# Patient Record
Sex: Male | Born: 1935 | Race: Black or African American | Hispanic: No | Smoking: Former smoker
Health system: Southern US, Community
[De-identification: ages and names within clinical notes are randomized; demographics above are authoritative.]

## PROBLEM LIST (undated history)

## (undated) DIAGNOSIS — I5022 Chronic systolic (congestive) heart failure: Secondary | ICD-10-CM

## (undated) DIAGNOSIS — I10 Essential (primary) hypertension: Secondary | ICD-10-CM

## (undated) DIAGNOSIS — Z950 Presence of cardiac pacemaker: Secondary | ICD-10-CM

## (undated) DIAGNOSIS — Z95810 Presence of automatic (implantable) cardiac defibrillator: Secondary | ICD-10-CM

## (undated) DIAGNOSIS — N189 Chronic kidney disease, unspecified: Secondary | ICD-10-CM

## (undated) DIAGNOSIS — D126 Benign neoplasm of colon, unspecified: Secondary | ICD-10-CM

## (undated) DIAGNOSIS — G459 Transient cerebral ischemic attack, unspecified: Secondary | ICD-10-CM

## (undated) DIAGNOSIS — I219 Acute myocardial infarction, unspecified: Secondary | ICD-10-CM

## (undated) DIAGNOSIS — R972 Elevated prostate specific antigen [PSA]: Secondary | ICD-10-CM

## (undated) DIAGNOSIS — I6529 Occlusion and stenosis of unspecified carotid artery: Secondary | ICD-10-CM

## (undated) DIAGNOSIS — Z87442 Personal history of urinary calculi: Secondary | ICD-10-CM

## (undated) DIAGNOSIS — I739 Peripheral vascular disease, unspecified: Secondary | ICD-10-CM

## (undated) DIAGNOSIS — I313 Pericardial effusion (noninflammatory): Secondary | ICD-10-CM

## (undated) DIAGNOSIS — I454 Nonspecific intraventricular block: Secondary | ICD-10-CM

## (undated) DIAGNOSIS — E119 Type 2 diabetes mellitus without complications: Secondary | ICD-10-CM

## (undated) DIAGNOSIS — I3139 Other pericardial effusion (noninflammatory): Secondary | ICD-10-CM

## (undated) DIAGNOSIS — E785 Hyperlipidemia, unspecified: Secondary | ICD-10-CM

## (undated) DIAGNOSIS — J189 Pneumonia, unspecified organism: Secondary | ICD-10-CM

## (undated) HISTORY — DX: Essential (primary) hypertension: I10

## (undated) HISTORY — DX: Pericardial effusion (noninflammatory): I31.3

## (undated) HISTORY — PX: CATARACT EXTRACTION W/ INTRAOCULAR LENS  IMPLANT, BILATERAL: SHX1307

## (undated) HISTORY — DX: Other pericardial effusion (noninflammatory): I31.39

## (undated) HISTORY — DX: Peripheral vascular disease, unspecified: I73.9

## (undated) HISTORY — PX: INSERT / REPLACE / REMOVE PACEMAKER: SUR710

## (undated) HISTORY — PX: CARDIAC CATHETERIZATION: SHX172

## (undated) HISTORY — DX: Elevated prostate specific antigen (PSA): R97.20

## (undated) HISTORY — DX: Nonspecific intraventricular block: I45.4

## (undated) HISTORY — DX: Hyperlipidemia, unspecified: E78.5

## (undated) HISTORY — DX: Transient cerebral ischemic attack, unspecified: G45.9

## (undated) HISTORY — PX: CYSTOSCOPY: SUR368

## (undated) HISTORY — DX: Occlusion and stenosis of unspecified carotid artery: I65.29

## (undated) HISTORY — DX: Chronic systolic (congestive) heart failure: I50.22

## (undated) HISTORY — DX: Benign neoplasm of colon, unspecified: D12.6

## (undated) SURGERY — Surgical Case
Anesthesia: *Unknown

---

## 1988-09-25 DIAGNOSIS — I219 Acute myocardial infarction, unspecified: Secondary | ICD-10-CM

## 1988-09-25 HISTORY — PX: CORONARY ANGIOPLASTY WITH STENT PLACEMENT: SHX49

## 1988-09-25 HISTORY — DX: Acute myocardial infarction, unspecified: I21.9

## 1989-01-01 DIAGNOSIS — E1151 Type 2 diabetes mellitus with diabetic peripheral angiopathy without gangrene: Secondary | ICD-10-CM

## 1998-02-15 ENCOUNTER — Encounter: Admission: RE | Admit: 1998-02-15 | Discharge: 1998-02-15 | Payer: Self-pay | Admitting: Internal Medicine

## 1998-02-22 ENCOUNTER — Encounter: Admission: RE | Admit: 1998-02-22 | Discharge: 1998-02-22 | Payer: Self-pay | Admitting: Internal Medicine

## 1998-04-12 ENCOUNTER — Encounter: Admission: RE | Admit: 1998-04-12 | Discharge: 1998-04-12 | Payer: Self-pay | Admitting: Internal Medicine

## 1999-08-05 ENCOUNTER — Encounter: Admission: RE | Admit: 1999-08-05 | Discharge: 1999-08-05 | Payer: Self-pay | Admitting: Internal Medicine

## 1999-08-11 ENCOUNTER — Encounter: Admission: RE | Admit: 1999-08-11 | Discharge: 1999-08-11 | Payer: Self-pay | Admitting: Internal Medicine

## 1999-08-25 ENCOUNTER — Encounter: Admission: RE | Admit: 1999-08-25 | Discharge: 1999-08-25 | Payer: Self-pay | Admitting: Internal Medicine

## 1999-10-05 ENCOUNTER — Encounter: Admission: RE | Admit: 1999-10-05 | Discharge: 1999-10-05 | Payer: Self-pay | Admitting: Internal Medicine

## 1999-12-15 ENCOUNTER — Encounter: Admission: RE | Admit: 1999-12-15 | Discharge: 1999-12-15 | Payer: Self-pay | Admitting: Internal Medicine

## 2000-07-17 ENCOUNTER — Encounter: Admission: RE | Admit: 2000-07-17 | Discharge: 2000-07-17 | Payer: Self-pay

## 2000-07-24 ENCOUNTER — Encounter: Admission: RE | Admit: 2000-07-24 | Discharge: 2000-07-24 | Payer: Self-pay | Admitting: Hematology and Oncology

## 2000-10-01 ENCOUNTER — Emergency Department (HOSPITAL_COMMUNITY): Admission: EM | Admit: 2000-10-01 | Discharge: 2000-10-01 | Payer: Self-pay | Admitting: Emergency Medicine

## 2000-11-09 ENCOUNTER — Encounter: Payer: Self-pay | Admitting: Emergency Medicine

## 2000-11-09 ENCOUNTER — Emergency Department (HOSPITAL_COMMUNITY): Admission: EM | Admit: 2000-11-09 | Discharge: 2000-11-09 | Payer: Self-pay | Admitting: Emergency Medicine

## 2001-01-09 ENCOUNTER — Encounter: Admission: RE | Admit: 2001-01-09 | Discharge: 2001-01-09 | Payer: Self-pay | Admitting: Internal Medicine

## 2001-08-31 ENCOUNTER — Emergency Department (HOSPITAL_COMMUNITY): Admission: EM | Admit: 2001-08-31 | Discharge: 2001-08-31 | Payer: Self-pay | Admitting: Emergency Medicine

## 2001-10-30 ENCOUNTER — Encounter: Admission: RE | Admit: 2001-10-30 | Discharge: 2001-10-30 | Payer: Self-pay | Admitting: Internal Medicine

## 2001-11-26 ENCOUNTER — Emergency Department (HOSPITAL_COMMUNITY): Admission: EM | Admit: 2001-11-26 | Discharge: 2001-11-26 | Payer: Self-pay | Admitting: Emergency Medicine

## 2002-02-05 ENCOUNTER — Encounter: Admission: RE | Admit: 2002-02-05 | Discharge: 2002-02-05 | Payer: Self-pay | Admitting: Internal Medicine

## 2002-02-06 ENCOUNTER — Encounter: Admission: RE | Admit: 2002-02-06 | Discharge: 2002-02-06 | Payer: Self-pay | Admitting: Internal Medicine

## 2002-05-06 ENCOUNTER — Encounter: Payer: Self-pay | Admitting: Emergency Medicine

## 2002-05-06 ENCOUNTER — Emergency Department (HOSPITAL_COMMUNITY): Admission: EM | Admit: 2002-05-06 | Discharge: 2002-05-06 | Payer: Self-pay | Admitting: Emergency Medicine

## 2002-12-30 ENCOUNTER — Encounter: Admission: RE | Admit: 2002-12-30 | Discharge: 2002-12-30 | Payer: Self-pay | Admitting: Internal Medicine

## 2003-01-01 ENCOUNTER — Ambulatory Visit (HOSPITAL_COMMUNITY): Admission: RE | Admit: 2003-01-01 | Discharge: 2003-01-01 | Payer: Self-pay | Admitting: Internal Medicine

## 2003-01-01 ENCOUNTER — Encounter: Payer: Self-pay | Admitting: Internal Medicine

## 2003-04-07 ENCOUNTER — Emergency Department (HOSPITAL_COMMUNITY): Admission: EM | Admit: 2003-04-07 | Discharge: 2003-04-07 | Payer: Self-pay | Admitting: Emergency Medicine

## 2003-06-11 ENCOUNTER — Emergency Department (HOSPITAL_COMMUNITY): Admission: EM | Admit: 2003-06-11 | Discharge: 2003-06-11 | Payer: Self-pay | Admitting: Emergency Medicine

## 2003-06-18 ENCOUNTER — Encounter: Admission: RE | Admit: 2003-06-18 | Discharge: 2003-06-18 | Payer: Self-pay | Admitting: Internal Medicine

## 2003-07-06 ENCOUNTER — Encounter: Payer: Self-pay | Admitting: Emergency Medicine

## 2003-07-07 ENCOUNTER — Inpatient Hospital Stay (HOSPITAL_COMMUNITY): Admission: EM | Admit: 2003-07-07 | Discharge: 2003-07-15 | Payer: Self-pay | Admitting: Emergency Medicine

## 2003-07-07 ENCOUNTER — Encounter (INDEPENDENT_AMBULATORY_CARE_PROVIDER_SITE_OTHER): Payer: Self-pay | Admitting: Cardiology

## 2003-07-10 ENCOUNTER — Encounter: Payer: Self-pay | Admitting: Infectious Diseases

## 2003-07-13 ENCOUNTER — Encounter: Payer: Self-pay | Admitting: Infectious Diseases

## 2003-08-19 ENCOUNTER — Encounter: Admission: RE | Admit: 2003-08-19 | Discharge: 2003-08-19 | Payer: Self-pay | Admitting: Internal Medicine

## 2003-08-19 ENCOUNTER — Ambulatory Visit (HOSPITAL_COMMUNITY): Admission: RE | Admit: 2003-08-19 | Discharge: 2003-08-19 | Payer: Self-pay | Admitting: Internal Medicine

## 2003-09-04 ENCOUNTER — Encounter: Admission: RE | Admit: 2003-09-04 | Discharge: 2003-09-04 | Payer: Self-pay | Admitting: Internal Medicine

## 2003-09-05 ENCOUNTER — Emergency Department (HOSPITAL_COMMUNITY): Admission: EM | Admit: 2003-09-05 | Discharge: 2003-09-05 | Payer: Self-pay | Admitting: Emergency Medicine

## 2003-09-06 ENCOUNTER — Inpatient Hospital Stay (HOSPITAL_COMMUNITY): Admission: EM | Admit: 2003-09-06 | Discharge: 2003-09-12 | Payer: Self-pay | Admitting: Emergency Medicine

## 2003-09-11 DIAGNOSIS — I6529 Occlusion and stenosis of unspecified carotid artery: Secondary | ICD-10-CM

## 2003-09-11 HISTORY — PX: CAROTID STENT: SHX1301

## 2003-10-18 ENCOUNTER — Emergency Department (HOSPITAL_COMMUNITY): Admission: EM | Admit: 2003-10-18 | Discharge: 2003-10-18 | Payer: Self-pay | Admitting: *Deleted

## 2004-01-19 ENCOUNTER — Encounter: Admission: RE | Admit: 2004-01-19 | Discharge: 2004-01-19 | Payer: Self-pay | Admitting: Internal Medicine

## 2004-01-27 ENCOUNTER — Inpatient Hospital Stay (HOSPITAL_COMMUNITY): Admission: EM | Admit: 2004-01-27 | Discharge: 2004-01-29 | Payer: Self-pay | Admitting: Emergency Medicine

## 2004-02-25 ENCOUNTER — Encounter: Admission: RE | Admit: 2004-02-25 | Discharge: 2004-02-25 | Payer: Self-pay | Admitting: Internal Medicine

## 2004-03-24 ENCOUNTER — Encounter: Admission: RE | Admit: 2004-03-24 | Discharge: 2004-03-24 | Payer: Self-pay | Admitting: Internal Medicine

## 2004-04-05 ENCOUNTER — Encounter: Admission: RE | Admit: 2004-04-05 | Discharge: 2004-04-05 | Payer: Self-pay | Admitting: Internal Medicine

## 2004-05-11 ENCOUNTER — Encounter: Admission: RE | Admit: 2004-05-11 | Discharge: 2004-05-11 | Payer: Self-pay | Admitting: Internal Medicine

## 2004-05-24 ENCOUNTER — Inpatient Hospital Stay (HOSPITAL_COMMUNITY): Admission: RE | Admit: 2004-05-24 | Discharge: 2004-05-25 | Payer: Self-pay | Admitting: Internal Medicine

## 2004-05-24 HISTORY — PX: CARDIAC DEFIBRILLATOR PLACEMENT: SHX171

## 2004-06-28 ENCOUNTER — Ambulatory Visit: Payer: Self-pay | Admitting: Internal Medicine

## 2004-06-30 ENCOUNTER — Emergency Department (HOSPITAL_COMMUNITY): Admission: EM | Admit: 2004-06-30 | Discharge: 2004-06-30 | Payer: Self-pay | Admitting: Emergency Medicine

## 2004-07-08 ENCOUNTER — Ambulatory Visit (HOSPITAL_COMMUNITY): Admission: RE | Admit: 2004-07-08 | Discharge: 2004-07-08 | Payer: Self-pay | Admitting: Internal Medicine

## 2004-08-30 ENCOUNTER — Ambulatory Visit: Payer: Self-pay | Admitting: Internal Medicine

## 2004-09-05 ENCOUNTER — Ambulatory Visit (HOSPITAL_COMMUNITY): Admission: RE | Admit: 2004-09-05 | Discharge: 2004-09-05 | Payer: Self-pay | Admitting: Gastroenterology

## 2004-09-05 ENCOUNTER — Encounter (INDEPENDENT_AMBULATORY_CARE_PROVIDER_SITE_OTHER): Payer: Self-pay | Admitting: Specialist

## 2004-09-05 DIAGNOSIS — K2960 Other gastritis without bleeding: Secondary | ICD-10-CM | POA: Insufficient documentation

## 2004-10-13 ENCOUNTER — Ambulatory Visit: Payer: Self-pay | Admitting: Internal Medicine

## 2004-12-20 ENCOUNTER — Emergency Department (HOSPITAL_COMMUNITY): Admission: EM | Admit: 2004-12-20 | Discharge: 2004-12-20 | Payer: Self-pay | Admitting: Emergency Medicine

## 2005-03-07 ENCOUNTER — Ambulatory Visit: Payer: Self-pay | Admitting: Internal Medicine

## 2005-03-14 ENCOUNTER — Ambulatory Visit: Payer: Self-pay | Admitting: Internal Medicine

## 2005-04-12 ENCOUNTER — Inpatient Hospital Stay (HOSPITAL_COMMUNITY): Admission: EM | Admit: 2005-04-12 | Discharge: 2005-04-14 | Payer: Self-pay | Admitting: Emergency Medicine

## 2005-04-13 ENCOUNTER — Ambulatory Visit: Payer: Self-pay | Admitting: Cardiovascular Disease

## 2005-04-13 ENCOUNTER — Encounter (INDEPENDENT_AMBULATORY_CARE_PROVIDER_SITE_OTHER): Payer: Self-pay | Admitting: *Deleted

## 2005-04-13 ENCOUNTER — Ambulatory Visit: Payer: Self-pay | Admitting: Internal Medicine

## 2005-04-19 ENCOUNTER — Ambulatory Visit: Payer: Self-pay | Admitting: Cardiology

## 2005-04-27 ENCOUNTER — Ambulatory Visit: Payer: Self-pay | Admitting: Internal Medicine

## 2005-04-27 ENCOUNTER — Ambulatory Visit: Payer: Self-pay

## 2005-05-02 ENCOUNTER — Ambulatory Visit: Payer: Self-pay | Admitting: Internal Medicine

## 2005-05-16 ENCOUNTER — Ambulatory Visit: Payer: Self-pay | Admitting: Internal Medicine

## 2005-06-08 ENCOUNTER — Ambulatory Visit: Payer: Self-pay | Admitting: Internal Medicine

## 2005-07-27 ENCOUNTER — Ambulatory Visit: Payer: Self-pay | Admitting: Internal Medicine

## 2005-08-24 ENCOUNTER — Ambulatory Visit: Payer: Self-pay | Admitting: Internal Medicine

## 2005-09-12 ENCOUNTER — Ambulatory Visit: Payer: Self-pay | Admitting: Internal Medicine

## 2005-09-20 ENCOUNTER — Ambulatory Visit: Payer: Self-pay | Admitting: Internal Medicine

## 2005-10-21 ENCOUNTER — Emergency Department (HOSPITAL_COMMUNITY): Admission: EM | Admit: 2005-10-21 | Discharge: 2005-10-21 | Payer: Self-pay | Admitting: Emergency Medicine

## 2005-11-08 ENCOUNTER — Ambulatory Visit: Payer: Self-pay | Admitting: Internal Medicine

## 2005-12-12 ENCOUNTER — Ambulatory Visit: Payer: Self-pay | Admitting: Internal Medicine

## 2006-01-18 ENCOUNTER — Ambulatory Visit: Payer: Self-pay | Admitting: Internal Medicine

## 2006-04-10 ENCOUNTER — Ambulatory Visit: Payer: Self-pay | Admitting: Internal Medicine

## 2006-06-19 ENCOUNTER — Ambulatory Visit: Payer: Self-pay | Admitting: Internal Medicine

## 2006-06-28 ENCOUNTER — Ambulatory Visit: Payer: Self-pay | Admitting: Internal Medicine

## 2006-07-19 ENCOUNTER — Ambulatory Visit: Payer: Self-pay | Admitting: Internal Medicine

## 2006-07-19 LAB — CONVERTED CEMR LAB
BUN: 16 mg/dL (ref 6–23)
CO2: 24 meq/L (ref 19–32)
Calcium: 9.8 mg/dL (ref 8.4–10.5)
Chloride: 106 meq/L (ref 96–112)
Creatinine, Ser: 1.31 mg/dL (ref 0.40–1.50)
Glucose, Bld: 195 mg/dL — ABNORMAL HIGH (ref 70–99)
Potassium: 4.1 meq/L (ref 3.5–5.3)
Sodium: 139 meq/L (ref 135–145)

## 2006-10-02 ENCOUNTER — Ambulatory Visit: Payer: Self-pay | Admitting: Internal Medicine

## 2006-10-12 ENCOUNTER — Ambulatory Visit: Payer: Self-pay

## 2006-12-07 ENCOUNTER — Telehealth (INDEPENDENT_AMBULATORY_CARE_PROVIDER_SITE_OTHER): Payer: Self-pay | Admitting: *Deleted

## 2006-12-18 ENCOUNTER — Telehealth: Payer: Self-pay | Admitting: *Deleted

## 2007-01-02 ENCOUNTER — Telehealth: Payer: Self-pay | Admitting: *Deleted

## 2007-01-02 ENCOUNTER — Emergency Department (HOSPITAL_COMMUNITY): Admission: EM | Admit: 2007-01-02 | Discharge: 2007-01-02 | Payer: Self-pay | Admitting: Emergency Medicine

## 2007-01-02 DIAGNOSIS — E785 Hyperlipidemia, unspecified: Secondary | ICD-10-CM

## 2007-01-02 DIAGNOSIS — D509 Iron deficiency anemia, unspecified: Secondary | ICD-10-CM

## 2007-01-02 DIAGNOSIS — I739 Peripheral vascular disease, unspecified: Secondary | ICD-10-CM

## 2007-01-03 ENCOUNTER — Ambulatory Visit: Payer: Self-pay | Admitting: Internal Medicine

## 2007-01-03 LAB — CONVERTED CEMR LAB: Blood Glucose, Fingerstick: 195

## 2007-01-09 ENCOUNTER — Encounter: Payer: Self-pay | Admitting: Internal Medicine

## 2007-02-28 ENCOUNTER — Encounter: Payer: Self-pay | Admitting: Internal Medicine

## 2007-04-18 ENCOUNTER — Ambulatory Visit: Payer: Self-pay | Admitting: Internal Medicine

## 2007-04-24 ENCOUNTER — Telehealth (INDEPENDENT_AMBULATORY_CARE_PROVIDER_SITE_OTHER): Payer: Self-pay | Admitting: *Deleted

## 2007-04-30 ENCOUNTER — Ambulatory Visit: Payer: Self-pay | Admitting: Internal Medicine

## 2007-07-01 ENCOUNTER — Telehealth: Payer: Self-pay | Admitting: *Deleted

## 2007-07-10 ENCOUNTER — Telehealth: Payer: Self-pay | Admitting: *Deleted

## 2007-07-30 ENCOUNTER — Ambulatory Visit: Payer: Self-pay | Admitting: Internal Medicine

## 2007-08-18 ENCOUNTER — Emergency Department (HOSPITAL_COMMUNITY): Admission: EM | Admit: 2007-08-18 | Discharge: 2007-08-18 | Payer: Self-pay | Admitting: Emergency Medicine

## 2007-09-23 ENCOUNTER — Telehealth: Payer: Self-pay | Admitting: *Deleted

## 2007-10-10 ENCOUNTER — Encounter: Payer: Self-pay | Admitting: Internal Medicine

## 2007-10-21 ENCOUNTER — Emergency Department (HOSPITAL_COMMUNITY): Admission: EM | Admit: 2007-10-21 | Discharge: 2007-10-21 | Payer: Self-pay | Admitting: Emergency Medicine

## 2007-10-23 ENCOUNTER — Encounter: Payer: Self-pay | Admitting: Internal Medicine

## 2007-10-29 ENCOUNTER — Ambulatory Visit: Payer: Self-pay | Admitting: Internal Medicine

## 2007-12-25 ENCOUNTER — Ambulatory Visit: Payer: Self-pay | Admitting: Internal Medicine

## 2007-12-25 DIAGNOSIS — I5022 Chronic systolic (congestive) heart failure: Secondary | ICD-10-CM

## 2007-12-25 DIAGNOSIS — I2510 Atherosclerotic heart disease of native coronary artery without angina pectoris: Secondary | ICD-10-CM

## 2007-12-25 LAB — CONVERTED CEMR LAB
ALT: 13 units/L (ref 0–53)
AST: 22 units/L (ref 0–37)
Albumin: 4.4 g/dL (ref 3.5–5.2)
Alkaline Phosphatase: 65 units/L (ref 39–117)
BUN: 28 mg/dL — ABNORMAL HIGH (ref 6–23)
Basophils Absolute: 0 10*3/uL (ref 0.0–0.1)
Basophils Relative: 1 % (ref 0–1)
Blood Glucose, Fingerstick: 165
CO2: 22 meq/L (ref 19–32)
Calcium: 9.6 mg/dL (ref 8.4–10.5)
Chloride: 107 meq/L (ref 96–112)
Cholesterol: 159 mg/dL (ref 0–200)
Creatinine, Ser: 1.26 mg/dL (ref 0.40–1.50)
Creatinine, Urine: 284.4 mg/dL
Eosinophils Absolute: 0.1 10*3/uL (ref 0.0–0.7)
Eosinophils Relative: 2 % (ref 0–5)
Ferritin: 47 ng/mL (ref 22–322)
Glucose, Bld: 187 mg/dL — ABNORMAL HIGH (ref 70–99)
HCT: 44.7 % (ref 39.0–52.0)
HDL: 61 mg/dL (ref >39)
Hemoglobin: 14.6 g/dL (ref 13.0–17.0)
Hgb A1c MFr Bld: 8 %
LDL Cholesterol: 80 mg/dL (ref 0–99)
Lymphocytes Relative: 36 % (ref 12–46)
Lymphs Abs: 2.3 10*3/uL (ref 0.7–4.0)
MCHC: 32.7 g/dL (ref 30.0–36.0)
MCV: 95.5 fL (ref 78.0–100.0)
Microalb Creat Ratio: 9.3 mg/g (ref 0.0–30.0)
Microalb, Ur: 2.65 mg/dL — ABNORMAL HIGH (ref 0.00–1.89)
Monocytes Absolute: 0.7 10*3/uL (ref 0.1–1.0)
Monocytes Relative: 12 % (ref 3–12)
Neutro Abs: 3.1 10*3/uL (ref 1.7–7.7)
Neutrophils Relative %: 50 % (ref 43–77)
Platelets: 196 10*3/uL (ref 150–400)
Potassium: 4.5 meq/L (ref 3.5–5.3)
RBC: 4.68 M/uL (ref 4.22–5.81)
RDW: 14.5 % (ref 11.5–15.5)
Sodium: 141 meq/L (ref 135–145)
Total Bilirubin: 0.6 mg/dL (ref 0.3–1.2)
Total CHOL/HDL Ratio: 2.6
Total Protein: 7.4 g/dL (ref 6.0–8.3)
Triglycerides: 91 mg/dL (ref <150)
VLDL: 18 mg/dL (ref 0–40)
WBC: 6.3 10*3/uL (ref 4.0–10.5)

## 2007-12-31 ENCOUNTER — Encounter: Payer: Self-pay | Admitting: Internal Medicine

## 2008-01-02 ENCOUNTER — Ambulatory Visit: Payer: Self-pay | Admitting: Infectious Disease

## 2008-01-02 LAB — CONVERTED CEMR LAB: Blood Glucose, Home Monitor: 2 mg/dL

## 2008-01-03 ENCOUNTER — Encounter: Payer: Self-pay | Admitting: Internal Medicine

## 2008-01-13 ENCOUNTER — Encounter: Payer: Self-pay | Admitting: Internal Medicine

## 2008-02-06 ENCOUNTER — Ambulatory Visit: Payer: Self-pay | Admitting: Internal Medicine

## 2008-02-07 ENCOUNTER — Telehealth: Payer: Self-pay | Admitting: Internal Medicine

## 2008-02-18 ENCOUNTER — Encounter: Payer: Self-pay | Admitting: Internal Medicine

## 2008-05-06 ENCOUNTER — Ambulatory Visit: Payer: Self-pay | Admitting: Cardiology

## 2008-05-06 ENCOUNTER — Observation Stay (HOSPITAL_COMMUNITY): Admission: AD | Admit: 2008-05-06 | Discharge: 2008-05-08 | Payer: Self-pay | Admitting: Internal Medicine

## 2008-05-06 ENCOUNTER — Ambulatory Visit: Payer: Self-pay

## 2008-05-15 ENCOUNTER — Ambulatory Visit: Payer: Self-pay | Admitting: Internal Medicine

## 2008-07-09 ENCOUNTER — Encounter: Payer: Self-pay | Admitting: Internal Medicine

## 2008-08-17 ENCOUNTER — Telehealth: Payer: Self-pay | Admitting: Internal Medicine

## 2008-09-21 ENCOUNTER — Emergency Department (HOSPITAL_COMMUNITY): Admission: EM | Admit: 2008-09-21 | Discharge: 2008-09-21 | Payer: Self-pay | Admitting: Emergency Medicine

## 2008-09-21 DIAGNOSIS — E875 Hyperkalemia: Secondary | ICD-10-CM

## 2008-09-30 ENCOUNTER — Ambulatory Visit: Payer: Self-pay | Admitting: Internal Medicine

## 2008-09-30 ENCOUNTER — Encounter: Payer: Self-pay | Admitting: Internal Medicine

## 2008-09-30 LAB — CONVERTED CEMR LAB
ALT: 38 units/L (ref 0–53)
AST: 39 units/L — ABNORMAL HIGH (ref 0–37)
Albumin: 3.8 g/dL (ref 3.5–5.2)
Alkaline Phosphatase: 66 units/L (ref 39–117)
BUN: 22 mg/dL (ref 6–23)
Basophils Absolute: 0 10*3/uL (ref 0.0–0.1)
Basophils Relative: 0 % (ref 0–1)
Blood Glucose, AC Bkfst: 297 mg/dL
CO2: 27 meq/L (ref 19–32)
Calcium: 9.8 mg/dL (ref 8.4–10.5)
Chloride: 103 meq/L (ref 96–112)
Cholesterol: 160 mg/dL (ref 0–200)
Creatinine, Ser: 1.72 mg/dL — ABNORMAL HIGH (ref 0.40–1.50)
Eosinophils Absolute: 0.1 10*3/uL (ref 0.0–0.7)
Eosinophils Relative: 2 % (ref 0–5)
Glucose, Bld: 276 mg/dL — ABNORMAL HIGH (ref 70–99)
HCT: 43.5 % (ref 39.0–52.0)
HDL: 53 mg/dL (ref >39)
Hemoglobin: 14.4 g/dL (ref 13.0–17.0)
Hgb A1c MFr Bld: 11.6 %
LDL Cholesterol: 90 mg/dL (ref 0–99)
Lymphocytes Relative: 38 % (ref 12–46)
Lymphs Abs: 2.4 10*3/uL (ref 0.7–4.0)
MCHC: 33.1 g/dL (ref 30.0–36.0)
MCV: 94.6 fL (ref 78.0–100.0)
Monocytes Absolute: 0.7 10*3/uL (ref 0.1–1.0)
Monocytes Relative: 12 % (ref 3–12)
Neutro Abs: 3 10*3/uL (ref 1.7–7.7)
Neutrophils Relative %: 48 % (ref 43–77)
Platelets: 176 10*3/uL (ref 150–400)
Potassium: 5.3 meq/L (ref 3.5–5.3)
RBC: 4.6 M/uL (ref 4.22–5.81)
RDW: 13.4 % (ref 11.5–15.5)
Sodium: 136 meq/L (ref 135–145)
Total Bilirubin: 0.6 mg/dL (ref 0.3–1.2)
Total CHOL/HDL Ratio: 3
Total Protein: 7.4 g/dL (ref 6.0–8.3)
Triglycerides: 85 mg/dL (ref <150)
VLDL: 17 mg/dL (ref 0–40)
WBC: 6.3 10*3/uL (ref 4.0–10.5)

## 2008-10-13 ENCOUNTER — Telehealth (INDEPENDENT_AMBULATORY_CARE_PROVIDER_SITE_OTHER): Payer: Self-pay | Admitting: *Deleted

## 2008-11-18 ENCOUNTER — Encounter: Payer: Self-pay | Admitting: Licensed Clinical Social Worker

## 2008-11-18 ENCOUNTER — Telehealth: Payer: Self-pay | Admitting: Internal Medicine

## 2008-11-18 ENCOUNTER — Encounter: Payer: Self-pay | Admitting: Internal Medicine

## 2008-11-20 ENCOUNTER — Telehealth: Payer: Self-pay | Admitting: Licensed Clinical Social Worker

## 2008-11-30 ENCOUNTER — Telehealth: Payer: Self-pay | Admitting: Internal Medicine

## 2008-12-18 ENCOUNTER — Encounter: Payer: Self-pay | Admitting: Internal Medicine

## 2008-12-28 ENCOUNTER — Telehealth: Payer: Self-pay | Admitting: Licensed Clinical Social Worker

## 2009-02-18 ENCOUNTER — Emergency Department (HOSPITAL_COMMUNITY): Admission: EM | Admit: 2009-02-18 | Discharge: 2009-02-18 | Payer: Self-pay | Admitting: Emergency Medicine

## 2009-03-10 ENCOUNTER — Ambulatory Visit: Payer: Self-pay | Admitting: Internal Medicine

## 2009-03-10 DIAGNOSIS — R972 Elevated prostate specific antigen [PSA]: Secondary | ICD-10-CM

## 2009-03-10 DIAGNOSIS — K5900 Constipation, unspecified: Secondary | ICD-10-CM

## 2009-03-10 LAB — CONVERTED CEMR LAB
Blood Glucose, Fingerstick: 240
Hgb A1c MFr Bld: 8.2 %

## 2009-03-11 LAB — CONVERTED CEMR LAB
ALT: 14 units/L (ref 0–53)
AST: 21 units/L (ref 0–37)
Albumin: 4.2 g/dL (ref 3.5–5.2)
Alkaline Phosphatase: 56 units/L (ref 39–117)
BUN: 37 mg/dL — ABNORMAL HIGH (ref 6–23)
CO2: 20 meq/L (ref 19–32)
Calcium: 9.7 mg/dL (ref 8.4–10.5)
Chloride: 109 meq/L (ref 96–112)
Creatinine, Ser: 1.76 mg/dL — ABNORMAL HIGH (ref 0.40–1.50)
Creatinine, Urine: 268.3 mg/dL
GFR calc Af Amer: 46 mL/min — ABNORMAL LOW (ref >60)
GFR calc non Af Amer: 38 mL/min — ABNORMAL LOW (ref >60)
Glucose, Bld: 222 mg/dL — ABNORMAL HIGH (ref 70–99)
Microalb Creat Ratio: 38.5 mg/g — ABNORMAL HIGH (ref 0.0–30.0)
Microalb, Ur: 10.32 mg/dL — ABNORMAL HIGH (ref 0.00–1.89)
PSA: 5.53 ng/mL — ABNORMAL HIGH (ref 0.10–4.00)
Potassium: 4.6 meq/L (ref 3.5–5.3)
Sodium: 138 meq/L (ref 135–145)
Total Bilirubin: 0.4 mg/dL (ref 0.3–1.2)
Total Protein: 7.6 g/dL (ref 6.0–8.3)

## 2009-05-07 ENCOUNTER — Encounter: Payer: Self-pay | Admitting: Internal Medicine

## 2009-05-12 ENCOUNTER — Ambulatory Visit: Payer: Self-pay | Admitting: Internal Medicine

## 2009-05-12 DIAGNOSIS — N259 Disorder resulting from impaired renal tubular function, unspecified: Secondary | ICD-10-CM | POA: Insufficient documentation

## 2009-05-12 DIAGNOSIS — M545 Low back pain: Secondary | ICD-10-CM

## 2009-05-12 LAB — CONVERTED CEMR LAB
BUN: 27 mg/dL — ABNORMAL HIGH (ref 6–23)
Bacteria, UA: NONE SEEN
Basophils Absolute: 0 10*3/uL (ref 0.0–0.1)
Basophils Relative: 0 % (ref 0–1)
Bilirubin Urine: NEGATIVE
Blood Glucose, Fingerstick: 217
CO2: 23 meq/L (ref 19–32)
Calcium: 9.8 mg/dL (ref 8.4–10.5)
Chloride: 105 meq/L (ref 96–112)
Creatinine, Ser: 1.54 mg/dL — ABNORMAL HIGH (ref 0.40–1.50)
Eosinophils Absolute: 0.2 10*3/uL (ref 0.0–0.7)
Eosinophils Relative: 3 % (ref 0–5)
Glucose, Bld: 194 mg/dL — ABNORMAL HIGH (ref 70–99)
HCT: 44.7 % (ref 39.0–52.0)
Hemoglobin, Urine: NEGATIVE
Hemoglobin: 14.2 g/dL (ref 13.0–17.0)
Hgb A1c MFr Bld: 8.5 %
Ketones, ur: NEGATIVE mg/dL
Lymphocytes Relative: 37 % (ref 12–46)
Lymphs Abs: 2.7 10*3/uL (ref 0.7–4.0)
MCHC: 31.8 g/dL (ref 30.0–36.0)
MCV: 96.8 fL (ref >=78.0)
Monocytes Absolute: 1.2 10*3/uL — ABNORMAL HIGH (ref 0.1–1.0)
Monocytes Relative: 17 % — ABNORMAL HIGH (ref 3–12)
Neutro Abs: 3.1 10*3/uL (ref 1.7–7.7)
Neutrophils Relative %: 43 % (ref 43–77)
Nitrite: NEGATIVE
Platelets: 189 10*3/uL (ref 150–400)
Potassium: 5.1 meq/L (ref 3.5–5.3)
Protein, ur: NEGATIVE mg/dL
RBC / HPF: NONE SEEN (ref <3)
RBC: 4.62 M/uL (ref 4.22–5.81)
RDW: 14.3 % (ref 11.5–15.5)
Sed Rate: 5 mm/hr (ref 0–16)
Sodium: 139 meq/L (ref 135–145)
Specific Gravity, Urine: 1.023 (ref 1.005–1.0)
Urine Glucose: 500 mg/dL — AB
Urobilinogen, UA: 0.2 (ref 0.0–1.0)
WBC: 7.2 10*3/uL (ref 4.0–10.5)
pH: 6 (ref 5.0–8.0)

## 2009-06-07 ENCOUNTER — Encounter: Payer: Self-pay | Admitting: Internal Medicine

## 2009-06-16 ENCOUNTER — Encounter: Payer: Self-pay | Admitting: Internal Medicine

## 2009-07-12 ENCOUNTER — Encounter: Payer: Self-pay | Admitting: Internal Medicine

## 2009-07-21 ENCOUNTER — Encounter: Payer: Self-pay | Admitting: Internal Medicine

## 2009-09-13 ENCOUNTER — Telehealth: Payer: Self-pay | Admitting: Internal Medicine

## 2009-11-03 ENCOUNTER — Encounter: Payer: Self-pay | Admitting: Internal Medicine

## 2009-11-10 ENCOUNTER — Ambulatory Visit: Payer: Self-pay | Admitting: Internal Medicine

## 2009-11-10 LAB — CONVERTED CEMR LAB
ALT: 14 units/L (ref 0–53)
AST: 27 units/L (ref 0–37)
Albumin: 4.1 g/dL (ref 3.5–5.2)
Alkaline Phosphatase: 56 units/L (ref 39–117)
BUN: 24 mg/dL — ABNORMAL HIGH (ref 6–23)
Basophils Absolute: 0 10*3/uL (ref 0.0–0.1)
Basophils Relative: 0 % (ref 0–1)
Blood Glucose, Fingerstick: 156
CO2: 24 meq/L (ref 19–32)
Calcium: 10.2 mg/dL (ref 8.4–10.5)
Chloride: 104 meq/L (ref 96–112)
Cholesterol: 149 mg/dL (ref 0–200)
Creatinine, Ser: 1.66 mg/dL — ABNORMAL HIGH (ref 0.40–1.50)
Eosinophils Absolute: 0.2 10*3/uL (ref 0.0–0.7)
Eosinophils Relative: 3 % (ref 0–5)
Glucose, Bld: 130 mg/dL — ABNORMAL HIGH (ref 70–99)
HCT: 44.7 % (ref 39.0–52.0)
HDL: 55 mg/dL (ref >39)
Hemoglobin: 14.3 g/dL (ref 13.0–17.0)
Hgb A1c MFr Bld: 9.3 %
LDL Cholesterol: 82 mg/dL (ref 0–99)
Lymphocytes Relative: 42 % (ref 12–46)
Lymphs Abs: 3 10*3/uL (ref 0.7–4.0)
MCHC: 32 g/dL (ref 30.0–36.0)
MCV: 96.8 fL (ref >=78.0)
Monocytes Absolute: 0.8 10*3/uL (ref 0.1–1.0)
Monocytes Relative: 11 % (ref 3–12)
Neutro Abs: 3.2 10*3/uL (ref 1.7–7.7)
Neutrophils Relative %: 44 % (ref 43–77)
Platelets: 165 10*3/uL (ref 150–400)
Potassium: 5 meq/L (ref 3.5–5.3)
RBC: 4.62 M/uL (ref 4.22–5.81)
RDW: 13.7 % (ref 11.5–15.5)
Sed Rate: 13 mm/hr (ref 0–16)
Sodium: 138 meq/L (ref 135–145)
Total Bilirubin: 0.5 mg/dL (ref 0.3–1.2)
Total CHOL/HDL Ratio: 2.7
Total Protein: 7.1 g/dL (ref 6.0–8.3)
Triglycerides: 62 mg/dL (ref <150)
VLDL: 12 mg/dL (ref 0–40)
WBC: 7.2 10*3/uL (ref 4.0–10.5)

## 2009-12-01 ENCOUNTER — Encounter: Payer: Self-pay | Admitting: Internal Medicine

## 2009-12-01 ENCOUNTER — Ambulatory Visit (HOSPITAL_COMMUNITY): Admission: RE | Admit: 2009-12-01 | Discharge: 2009-12-01 | Payer: Self-pay | Admitting: Internal Medicine

## 2009-12-07 ENCOUNTER — Ambulatory Visit: Payer: Self-pay | Admitting: Internal Medicine

## 2009-12-09 ENCOUNTER — Telehealth: Payer: Self-pay | Admitting: Internal Medicine

## 2009-12-21 ENCOUNTER — Encounter: Payer: Self-pay | Admitting: Internal Medicine

## 2009-12-29 ENCOUNTER — Ambulatory Visit: Payer: Self-pay | Admitting: Internal Medicine

## 2009-12-29 LAB — CONVERTED CEMR LAB
Blood Glucose, AC Bkfst: 278 mg/dL
Hgb A1c MFr Bld: 10.7 %

## 2010-01-15 ENCOUNTER — Emergency Department (HOSPITAL_COMMUNITY): Admission: EM | Admit: 2010-01-15 | Discharge: 2010-01-15 | Payer: Self-pay | Admitting: Emergency Medicine

## 2010-01-17 ENCOUNTER — Ambulatory Visit: Payer: Self-pay | Admitting: Internal Medicine

## 2010-01-17 LAB — CONVERTED CEMR LAB

## 2010-01-18 ENCOUNTER — Telehealth: Payer: Self-pay | Admitting: Internal Medicine

## 2010-01-18 ENCOUNTER — Telehealth (INDEPENDENT_AMBULATORY_CARE_PROVIDER_SITE_OTHER): Payer: Self-pay | Admitting: *Deleted

## 2010-01-24 ENCOUNTER — Telehealth: Payer: Self-pay | Admitting: Internal Medicine

## 2010-05-21 ENCOUNTER — Emergency Department (HOSPITAL_COMMUNITY): Admission: EM | Admit: 2010-05-21 | Discharge: 2010-05-21 | Payer: Self-pay | Admitting: Emergency Medicine

## 2010-06-01 ENCOUNTER — Encounter: Payer: Self-pay | Admitting: Internal Medicine

## 2010-06-13 ENCOUNTER — Ambulatory Visit: Payer: Self-pay

## 2010-07-04 ENCOUNTER — Telehealth: Payer: Self-pay | Admitting: Internal Medicine

## 2010-07-08 ENCOUNTER — Encounter: Payer: Self-pay | Admitting: Internal Medicine

## 2010-07-13 ENCOUNTER — Ambulatory Visit: Payer: Self-pay | Admitting: Internal Medicine

## 2010-07-13 LAB — CONVERTED CEMR LAB
Blood Glucose, Fingerstick: 177
Hgb A1c MFr Bld: 6.4 %

## 2010-08-17 ENCOUNTER — Emergency Department (HOSPITAL_COMMUNITY)
Admission: EM | Admit: 2010-08-17 | Discharge: 2010-08-17 | Payer: Self-pay | Source: Home / Self Care | Admitting: Emergency Medicine

## 2010-08-30 ENCOUNTER — Encounter: Payer: Self-pay | Admitting: Internal Medicine

## 2010-09-08 ENCOUNTER — Ambulatory Visit: Payer: Self-pay

## 2010-09-09 ENCOUNTER — Encounter (INDEPENDENT_AMBULATORY_CARE_PROVIDER_SITE_OTHER): Payer: Self-pay | Admitting: *Deleted

## 2010-09-27 ENCOUNTER — Emergency Department (HOSPITAL_COMMUNITY)
Admission: EM | Admit: 2010-09-27 | Discharge: 2010-09-27 | Payer: Self-pay | Source: Home / Self Care | Admitting: Emergency Medicine

## 2010-10-12 ENCOUNTER — Encounter (INDEPENDENT_AMBULATORY_CARE_PROVIDER_SITE_OTHER): Payer: Self-pay | Admitting: *Deleted

## 2010-10-21 ENCOUNTER — Encounter: Payer: Self-pay | Admitting: Internal Medicine

## 2010-10-27 NOTE — Assessment & Plan Note (Signed)
Summary: est/cfb   Vital Signs:  Patient profile:   77 year old male Height:      70 inches Weight:      188.4 pounds BMI:     27.13 Temp:     97.7 degrees F oral Pulse rate:   65 / minute BP sitting:   129 / 73  (right arm)  Vitals Entered By: Silverio Decamp NT II (November 10, 2009 10:01 AM) CC: check-up, need refills, trouble hearing, need pen refills Is Patient Diabetic? Yes Did you bring your meter with you today? No Pain Assessment Patient in pain? no      Nutritional Status BMI of 25 - 29 = overweight CBG Result 156  Have you ever been in a relationship where you felt threatened, hurt or afraid?No   Does patient need assistance? Functional Status Self care Ambulation Normal   Primary Care Provider:  [name] MD  CC:  check-up, need refills, trouble hearing, and need pen refills.  History of Present Illness: Patient returns today for follow up of his diabetes mellitus, hyperlipidemia, and other chronic medical problems.His main complaint is chronic low back pain, which is not well controlled with over-the-counter acetaminophen. He also reports gradually progressing bilateral subjective decrease in hearing.  Otherwise he is doing well without acute complaints. He reports that he did see Dr. Amedeo Plenty last year for a repeat colonoscopy. He reports that he is compliant with his medications.      Preventive Screening-Counseling & Management  Alcohol-Tobacco     Alcohol drinks/day: 0     Smoking Status: quit     Year Quit: 50 years  Caffeine-Diet-Exercise     Does Patient Exercise: yes     Type of exercise: Walking     Times/week: 7  Current Medications (verified): 1)  Simvastatin 80 Mg Tabs (Simvastatin) .... Take 1 Tablet By Mouth Once A Day 2)  Glucotrol 10 Mg Tabs (Glipizide) .... Take 2 Tablets By Mouth Two Times A Day 3)  Pantoprazole Sodium 40 Mg Tbec (Pantoprazole Sodium) .... Take 1 Tablet By Mouth Once A Day 4)  Aggrenox 25-200 Mg Cp12  (Aspirin-Dipyridamole) .... Take 1 Tablet By Mouth Two Times A Day 5)  Coreg 25 Mg Tabs (Carvedilol) .... Take 1 Tablet By Mouth Two Times A Day 6)  Freestyle Lite   Strp (Glucose Blood) .... Use To Test Blood Glucose Twice Daily 7)  Lancets   Misc (Lancets) .... Use To Test Blood Glucose Twice Daily 8)  Lantus Solostar 100 Unit/ml Soln (Insulin Glargine) .... Inject 20 Units Subcutaneously Once Daily At Bedtime 9)  Pen Needles 31g X 6 Mm Misc (Insulin Pen Needle) .... Use As Directed For Daily Insulin Injections 10)  Colace 100 Mg Caps (Docusate Sodium) .... Take 1 Capsule By Mouth Once A Day 11)  Acetaminophen-Codeine #3 300-30 Mg Tabs (Acetaminophen-Codeine) .... Take 1 Tablet By Mouth Every 6 Hours As Needed For Pain  Allergies (verified): 1)  ! Enalapril Maleate (Enalapril Maleate)  Review of Systems General:  Denies loss of appetite and sleep disorder. CV:  Denies chest pain or discomfort, difficulty breathing at night, difficulty breathing while lying down, and swelling of feet. Resp:  Denies shortness of breath. GI:  Denies abdominal pain, nausea, and vomiting. GU:  Denies dysuria. MS:  Complains of low back pain; denies muscle aches and cramps.  Physical Exam  General:  alert, no distress Ears:  tympanic membranes gray with good phlegm marks bilaterally; no exudate Lungs:  normal respiratory  effort, normal breath sounds, no crackles, and no wheezes.   Heart:  normal rate, regular rhythm, no murmur, no gallop, and no rub.   Abdomen:  soft, non-tender, normal bowel sounds, no hepatomegaly, and no splenomegaly.   Extremities:  trace bilateral ankle edema  Diabetes Management Exam:    Foot Exam (with socks and/or shoes not present):       Sensory-Monofilament:          Left foot: normal          Right foot: normal   Impression & Recommendations:  Problem # 1:  DIABETES MELLITUS, TYPE II (ICD-250.00) Patient's diabetes mellitus is not well controlled on current regimen. The  plan is to increase his Lantus insulin to 26 units at bedtime, and continue Glucotrol at current dose. Will also refer to diabetes educator Barnabas Harries.  His updated medication list for this problem includes:    Glucotrol 10 Mg Tabs (Glipizide) .Marland Kitchen... Take 2 tablets by mouth two times a day    Lantus Solostar 100 Unit/ml Soln (Insulin glargine) ..... Inject 26 units subcutaneously once daily at bedtime  Labs Reviewed: Creat: 1.54 (05/12/2009)     Last Eye Exam: No diabetic retinopathy.   Cataract OD. Exam by [name] L. Katy Fitch, MD   (05/07/2009) Reviewed HgBA1c results: 9.3 (11/10/2009)  8.5 (05/12/2009)  Orders: T-Hgb A1C (in-house) [id number]) T- Capillary Blood Glucose [id number])  Problem # 2:  HYPERLIPIDEMIA (ICD-272.4) Patient is doing well on current dose of simvastatin. We'll check a lipid panel today (patient reports that he is fasting).  His updated medication list for this problem includes:    Simvastatin 80 Mg Tabs (Simvastatin) .Marland Kitchen... Take 1 tablet by mouth once a day  Labs Reviewed: SGOT: 21 (03/10/2009)   SGPT: 14 (03/10/2009)   HDL:53 (09/30/2008), 61 (12/25/2007)  LDL:90 (09/30/2008), 80 (12/25/2007)  Chol:160 (09/30/2008), 159 (12/25/2007)  Trig:85 (09/30/2008), 91 (12/25/2007)  Orders: T-Lipid Profile [id number])  Problem # 3:  RENAL INSUFFICIENCY (ICD-588.9) Will check a metabolic panel today.  Orders: T-Comprehensive Metabolic Panel ([id number])  Problem # 4:  ISCHEMIC CARDIOMYOPATHY (ICD-414.8) Patient's symptoms are well compensated on current medication regimen.  His updated medication list for this problem includes:    Aggrenox 25-200 Mg Cp12 (Aspirin-dipyridamole) .Marland Kitchen... Take 1 tablet by mouth two times a day    Coreg 25 Mg Tabs (Carvedilol) .Marland Kitchen... Take 1 tablet by mouth two times a day  Labs Reviewed: Chol: 160 (09/30/2008)   HDL: 53 (09/30/2008)   LDL: 90 (09/30/2008)   TG: 85 (09/30/2008)  Problem # 5:  DECREASED HEARING, BILATERAL  (ICD-389.9) Will refer for audiometry.  Problem # 6:  LOW BACK PAIN SYNDROME (ICD-724.2) Patient has chronic low back pain without symptoms suggestive of a serious underlying disorder. Will check a CBC and sed rate as below. His implanted defibrillator precludes MRI of the spine; will consider lumbar spine x-rays if labs are abnormal.  Will treat with Tylenol #3 as below; I advised patient to use this sparingly.  His updated medication list for this problem includes:    Acetaminophen-codeine #3 300-30 Mg Tabs (Acetaminophen-codeine) .Marland Kitchen... Take 1 tablet by mouth two times a day as needed for pain  Orders: T-CBC w/Diff [id number]) T-Sed Rate (Automated) [id number])  Complete Medication List: 1)  Simvastatin 80 Mg Tabs (Simvastatin) .... Take 1 tablet by mouth once a day 2)  Glucotrol 10 Mg Tabs (Glipizide) .... Take 2 tablets by mouth two times a day 3)  Pantoprazole Sodium 40 Mg Tbec (Pantoprazole sodium) .Marland KitchenMarland KitchenMarland Kitchen  Take 1 tablet by mouth once a day 4)  Aggrenox 25-200 Mg Cp12 (Aspirin-dipyridamole) .... Take 1 tablet by mouth two times a day 5)  Coreg 25 Mg Tabs (Carvedilol) .... Take 1 tablet by mouth two times a day 6)  Freestyle Lite Strp (Glucose blood) .... Use to test blood glucose twice daily 7)  Lancets Misc (Lancets) .... Use to test blood glucose twice daily 8)  Lantus Solostar 100 Unit/ml Soln (Insulin glargine) .... Inject 26 units subcutaneously once daily at bedtime 9)  Pen Needles 31g X 6 Mm Misc (Insulin pen needle) .... Use as directed for daily insulin injections 10)  Colace 100 Mg Caps (Docusate sodium) .... Take 1 capsule by mouth once a day 11)  Acetaminophen-codeine #3 300-30 Mg Tabs (Acetaminophen-codeine) .... Take 1 tablet by mouth two times a day as needed for pain  Other Orders: T-Hemoccult Card-Multiple (take home) ([id number]) TD Toxoids IM 7 YR + [id number]) Admin 1st Vaccine [id number]) Audiometry (Audiometry)  Patient Instructions: 1)  Please schedule a follow-up  appointment in 6 weeks. 2)  Please schedule an appointment with diabetes educator Barnabas Harries. 3)  Increase Lantus insulin to 26 units daily.  Prescriptions: ACETAMINOPHEN-CODEINE #3 300-30 MG TABS (ACETAMINOPHEN-CODEINE) Take 1 tablet by mouth two times a day as needed for pain  #30 x 2   Entered and Authorized by:   [name] MD   Signed by:   [name] MD on 11/10/2009   Method used:   Print then Give to Patient   RxID:   [id number]   Process Orders Check Orders Results:     [facility]: Check successful Tests Sent for requisitioning (November 11, 2009 7:39 PM):     11/10/2009: [facility] -- T-Lipid Profile [id number] (signed)     11/10/2009: [facility] -- T-Comprehensive Metabolic Panel [id number] (signed)     11/10/2009: [facility] -- T-CBC w/Diff [id number] (signed)     11/10/2009: [facility] -- T-Sed Rate (Automated) [id number] (signed)     Prevention & Chronic Care Immunizations   Influenza vaccine: refused  (09/30/2008)   Influenza vaccine deferral: Refused  (11/10/2009)   Influenza vaccine due: 05/26/2009    Tetanus booster: 11/10/2009: Td    Pneumococcal vaccine: Pneumovax (Medicare)  (12/25/2007)    H. zoster vaccine: Not documented   H. zoster vaccine deferral: Deferred  (05/12/2009)  Colorectal Screening   Hemoccult: Not documented   Hemoccult action/deferral: Ordered  (11/10/2009)    Colonoscopy: Results: Ascending and sigmoid colon polyps; diverticulosis Pathology: Sigmoid colon polyp showed tubulovillous adenoma with high-grade glandular dysplasia and atypical glands within the stroma of the polyp. Colonoscopy performed by Dr. Teena Irani.  (09/05/2004)   Colonoscopy action/deferral: GI referral  (03/10/2009)   Colonoscopy due: 08/2007  Other Screening   PSA: 5.53  (03/10/2009)   PSA action/deferral: Discussed-PSA requested   (03/10/2009)   Smoking status: quit  (11/10/2009)    Screening comments: Reports that he saw Dr. Amedeo Plenty last year for repeat colonoscopy.  Diabetes Mellitus   HgbA1C: 9.3  (11/10/2009)   HgbA1C action/deferral: Ordered  (05/12/2009)    Eye exam: No diabetic retinopathy.   Cataract OD. Exam by [name] L. Katy Fitch, MD    (05/07/2009)   Eye exam due: 04/2010    Foot exam: yes  (11/10/2009)   Foot exam action/deferral: Do today   High risk foot: Yes  (11/10/2009)   Foot care education: Done  (11/10/2009)   Foot exam due: 02/07/2010  Urine microalbumin/creatinine ratio: 38.5  (03/10/2009)   Urine microalbumin action/deferral: Ordered    Diabetes flowsheet reviewed?: Yes   Progress toward A1C goal: Deteriorated  Lipids   Total Cholesterol: 160  (09/30/2008)   Lipid panel action/deferral: Lipid Panel ordered   LDL: 90  (09/30/2008)   LDL Direct: Not documented   HDL: 53  (09/30/2008)   Triglycerides: 85  (09/30/2008)    SGOT (AST): 21  (03/10/2009)   BMP action: Ordered   SGPT (ALT): 14  (03/10/2009) CMP ordered    Alkaline phosphatase: 56  (03/10/2009)   Total bilirubin: 0.4  (03/10/2009)    Lipid flowsheet reviewed?: Yes   Progress toward LDL goal: Unchanged  Self-Management Support :   Personal Goals (by the next clinic visit) :     Personal A1C goal: 7  (03/10/2009)     Personal blood pressure goal: 130/80  (03/10/2009)     Personal LDL goal: 70  (03/10/2009)    Patient will work on the following items until the next clinic visit to reach self-care goals:     Medications and monitoring: take my medicines every day, bring all of my medications to every visit  (11/10/2009)     Eating: drink diet soda or water instead of juice or soda, eat more vegetables, eat foods that are low in salt, eat baked foods instead of fried foods  (11/10/2009)     Activity: take a 30 minute walk every day  (11/10/2009)     Home glucose monitoring frequency: 1 time daily  (03/10/2009)     Diabetes self-management support: CBG self-monitoring log, Written self-care plan  (11/10/2009)   Diabetes care plan printed   Last medical nutrition therapy: 01/02/2008    Lipid self-management support: Written self-care plan  (11/10/2009)   Lipid self-care plan printed.   Nursing Instructions: Please get records from Dr. Amedeo Plenty, GI. Please get records from Dr. Karsten Ro, urology. Provide Hemoccult cards with instructions (see order) Diabetic foot exam today Give tetanus booster today    Diabetic Foot Exam Foot Inspection Is there a history of a foot ulcer?              No Is there a foot ulcer now?              No Can the patient see the bottom of their feet?          Yes Are the shoes appropriate in style and fit?          No Are the toenails long?                Yes Are the toenails thick?                Yes Are the toenails ingrown?              No Is there heavy callous build-up?              No Is there pain in the calf muscle (Intermittent claudication) when walking?    NoIs there a claw toe deformity?              No Is there elevated skin temperature?            No Is there limited ankle dorsiflexion?            No Is there foot or ankle muscle weakness?            No  Diabetic Foot Care Education Patient  educated on appropriate care of diabetic feet.  Pulse Check          Right Foot          Left Foot Posterior Tibial:        diminished            diminished Dorsalis Pedis:        diminished            diminished Comments: patient has very long toe nails on both feet appointment made for podiatrist High Risk Feet? Yes Set Next Diabetic Foot Exam here: 02/07/2010   10-g (5.07) Semmes-Weinstein Monofilament Test Performed by: Silverio Decamp NT II          Right Foot          Left Foot Site 1         normal         normal Site 2         normal         normal Site 3         normal         normal Site 4         normal         normal Site 5         normal          normal Site 6         normal         normal Site 7         normal         normal Site 8         normal         normal Site 9         normal         normal Site 10                    normal  Impression      normal         normal  Laboratory Results   Blood Tests   Date/Time Received: November 10, 2009 10:11 AM  Date/Time Reported: Lenoria Farrier  November 10, 2009 10:11 AM   HGBA1C: 9.3%   (Normal Range: Non-Diabetic - 3-6%   Control Diabetic - 6-8%) CBG Random:: 156mg /dL      Immunizations Administered:  Tetanus Vaccine:    Vaccine Type: Td    Site: left deltoid    Mfr: Burton    Dose: 0.5 ml    Route: IM    Given by: Nadine Counts (Porter)    Exp. Date: 08/10/2011    Lot #: [id number]    VIS given: 08/13/07 version given November 10, 2009.

## 2010-10-27 NOTE — Letter (Signed)
Summary: [facility]  [facility]   Imported By: Garlan Fillers 12/23/2009 10:33:24  _____________________________________________________________________  External Attachment:    Type:   Image     Comment:   External Document

## 2010-10-27 NOTE — Letter (Signed)
Summary: Device-Delinquent Check  [name], [name] [address] [place]   [place], [place] [address]   Phone: [phone]  Fax: [phone]     October 12, 2010 MRN: [id number]   [name] [address] [place],   [address]   Dear [name],  According to our records, you have not had your implanted device checked in the recommended period of time.  We are unable to determine appropriate device function without checking your device on a regular basis.  Please call our office to schedule an appointment, with Dr Lovena Le,  as soon as possible.  If you are having your device checked by another physician, please call us so that we may update our records.  Thank you,  Gasper Sells, EMT  October 12, 2010 2:56 PM   [facility] certified

## 2010-10-27 NOTE — Letter (Signed)
Summary: [name] DIABETIC SHOES/INSERTS  [name] DIABETIC SHOES/INSERTS   Imported By: [name] 06/27/2010 16:44:10  _____________________________________________________________________  External Attachment:    Type:   Image     Comment:   External Document

## 2010-10-27 NOTE — Therapy (Signed)
Summary: Audiology EVALUATION  Audiology EVALUATION   Imported By: Bonner Puna 12/07/2009 14:14:35  _____________________________________________________________________  External Attachment:    Type:   Image     Comment:   External Document

## 2010-10-27 NOTE — Procedures (Signed)
Summary: [facility] Colonoscopy  [facility] Colonoscopy   Imported By: Bonner Puna 11/12/2009 15:56:39  _____________________________________________________________________  External Attachment:    Type:   Image     Comment:   External Document  Appended Document: [facility].: Colonoscopy   Colonoscopy  Procedure date:  07/21/2009  Findings:      Results: 16 mm polyp in the transverse colon, resected and retrieved. Pathology showed tubular adenoma and fragments of benign colonic mucosa; no high grade dysplasia or malignancy identified. Exam by Dr. Teena Irani  Procedures Next Due Date:    Colonoscopy: 07/2014

## 2010-10-27 NOTE — Assessment & Plan Note (Signed)
Summary: DM TRAINING/CH   Allergies: 1)  ! Enalapril Maleate (Enalapril Maleate)   Complete Medication List: 1)  Simvastatin 80 Mg Tabs (Simvastatin) .... Take 1 tablet by mouth once a day 2)  Glucotrol 10 Mg Tabs (Glipizide) .... Take 2 tablets by mouth two times a day 3)  Pantoprazole Sodium 40 Mg Tbec (Pantoprazole sodium) .... Take 1 tablet by mouth once a day 4)  Aggrenox 25-200 Mg Cp12 (Aspirin-dipyridamole) .... Take 1 tablet by mouth two times a day 5)  Coreg 25 Mg Tabs (Carvedilol) .... Take 1 tablet by mouth two times a day 6)  Freestyle Lite Strp (Glucose blood) .... Use to test blood glucose twice daily 7)  Lancets Misc (Lancets) .... Use to test blood glucose twice daily 8)  Lantus Solostar 100 Unit/ml Soln (Insulin glargine) .... Inject 32 units subcutaneously once daily at bedtime 9)  Pen Needles 31g X 6 Mm Misc (Insulin pen needle) .... Use as directed for daily insulin injections 10)  Acetaminophen-codeine #3 300-30 Mg Tabs (Acetaminophen-codeine) .... Take 1 tablet by mouth two times a day as needed for pain 11)  Bepreve 1.5 % Soln (Bepotastine besilate) .... Instill one drop into the affected eyes two times a day  Other Orders: DSMT(Medicare) Individual, 30 Minutes [id number])  Diabetes Self Management Training  PCP: [name] MD Referring MD: Marinda Elk Date diagnosed with diabetes: 09/25/1988 Diabetes Type: Type 2 insulin treated Other persons present: woman friend Current smoking Status: quit  Assessment Daily activities: lives in retirement apartment, [place] Support: woman friend  Diabetes Medications:  Lipid lowering Meds? Yes Anti-platelet Meds? Yes Comments: CBG was 307 today fasting on patient's new Prodigy meter. He demonstarted prpoer use of his new meter with minimal instruction. He also demonstrarted good technique wiht insulin pen EXCEPT that he was turning injection knob instead of pushin git which was  resulting in no insulin sel finjection whihc is likley accounting fo rhis high blood sugars nad A1C. His friend an dhe both erbalized nad demonstrated prpoer techniqu by pusing injection knob and giing practice injection to fake skin. Patient cautioned to check CBG every morning as insulin dose may be too high now that he will be giing himself hte accurate amount of insulin. He was gien a recording sheet of papre wiht instrucytion to record CBG every day and call CDE at the end of this week or earlier if needed if CBG get too low.   Long Acting  Insulin Type:Lantus  Bedtime Dose: 32    Monitoring Self monitoring blood glucose 2 times a day Name of Meter  Prodigy Autocode Measures urine ketones? No  Time of Testing  Before Breakfast  Recent Episodes of: Requiring Help from another person  Hyperglycemia : Yes Hypoglycemia: No Severe Hypoglycemia : No   Wears Medical I.D. No Carrys Food for Low Blood sugar Yes Can you tell if your blood sugar is low? Yes   Would you  say you are physically active: Yes Diabetes Disease Process  Discussed today  Medications State name-action-dose-duration-side effects-and time to take medication: Demonstrates competency   State appropriate timing of food related to medication: Demonstrates competency   Demonstrates/verbalizes site selection and rotation for injections Demonstrates competency   Correctly draw up and administer insulin-[place]-Symlin-glucagon: Demonstrates competency    Nutritional Management  Monitoring State purpose and frequency of monitoring BG-ketones-HgbA1C  : Demonstrates competency   Perform glucose monitoring/ketone testing and record results correctly: Demonstrates competency  State target blood glucose and HgbA1C goals: Needs review/assistance Complications State the causes-signs and symptoms and prevention of Hyperglycemia: Demonstrates competency   Explain proper treatment of hyperglycemia: Demonstrates  competency   State the causes- signs and symptoms and prevention of hypoglycemia: Needs review/assistance Exercise Diabetes Management Education Done: 01/17/2010    BEHAVIORAL GOALS INITIAL Monitoring blood glucose levels daily: check CBgs daily and call at end of this week with list of am CBGs        Diabetes Self Management Support: friend and clinic staff Follow-up:6 months of earlier as needed for [facility]

## 2010-10-27 NOTE — Letter (Signed)
Summary: [facility] SUPPLY MED REQUEST   Imported By: Enedina Finner 07/08/2010 15:17:08  _____________________________________________________________________  External Attachment:    Type:   Image     Comment:   External Document

## 2010-10-27 NOTE — Assessment & Plan Note (Signed)
Summary: acute-low blood sugar/cfb(joines)   Vital Signs:  Patient profile:   77 year old male Height:      70 inches Weight:      185.0 pounds BMI:     26.64 Temp:     96.8 degrees F oral Pulse rate:   64 / minute BP sitting:   153 / 82  (right arm)  Vitals Entered By: Silverio Decamp NT II (July 13, 2010 3:35 PM) CC: checkup Is Patient Diabetic? Yes Did you bring your meter with you today? No Pain Assessment Patient in pain? no      Nutritional Status BMI of 25 - 29 = overweight CBG Result 177  Have you ever been in a relationship where you felt threatened, hurt or afraid?No   Does patient need assistance? Functional Status Self care Ambulation Normal   Diabetic Foot Exam Foot Inspection Is there a history of a foot ulcer?              No Is there a foot ulcer now?              No Can the patient see the bottom of their feet?          Yes Are the shoes appropriate in style and fit?          Yes Is there swelling or an abnormal foot shape?          No Are the toenails long?                Yes Are the toenails thick?                Yes Are the toenails ingrown?              No Is there heavy callous build-up?              No Is there pain in the calf muscle (Intermittent claudication) when walking?    NoIs there a claw toe deformity?              No Is there elevated skin temperature?            No Is there limited ankle dorsiflexion?            No Is there foot or ankle muscle weakness?            No  Diabetic Foot Care Education Patient educated on appropriate care of diabetic feet.   High Risk Feet? Yes   10-g (5.07) Semmes-Weinstein Monofilament Test Performed by: Silverio Decamp NT II          Right Foot          Left Foot Site 1         normal         normal Site 2         normal         normal Site 3         normal         normal Site 4         normal         normal Site 5         normal         normal Site 6         normal         normal Site 7          normal  normal Site 8         normal         normal Site 9         normal         normal  Impression      normal         normal   Primary Care Evynn Boutelle:  [name] MD  CC:  checkup.  History of Present Illness: 77 y/o m with h/o DM, HTN, HLD, comes to the clinic because he had passed out due to low blood sugar this 5 days ago - he had a big supper, took his lantus (30 units) and 2 tablets of glipizide and was watching tv  he does not remember what happened after that. when he woke up, EMS were giving him shots, and asking him to drink orange juice and peanut butter. he was told that his blood sugar had dropped <40 he had similar episode in April 2011 after his lantus dose was increased to 30 - checks CBGs at home, has readings about 60-70 few times, average is about 88-90.   - no furhter episode after that, conttinues to take his DM meds - no CP, SOB, focal weakness or other complaints - eats and drinks well    Preventive Screening-Counseling & Management  Alcohol-Tobacco     Alcohol drinks/day: 0     Smoking Status: quit     Year Quit: 50 years  Caffeine-Diet-Exercise     Does Patient Exercise: yes     Type of exercise: Walking     Times/week: 7  Current Medications (verified): 1)  Simvastatin 40 Mg Tabs (Simvastatin) .... Take 1 Tablet By Mouth Once A Day 2)  Glucotrol 10 Mg Tabs (Glipizide) .... Take 2 Tablets By Mouth Two Times A Day 3)  Pantoprazole Sodium 40 Mg Tbec (Pantoprazole Sodium) .... Take 1 Tablet By Mouth Once A Day 4)  Aggrenox 25-200 Mg Cp12 (Aspirin-Dipyridamole) .... Take 1 Tablet By Mouth Two Times A Day 5)  Coreg 25 Mg Tabs (Carvedilol) .... Take 1 Tablet By Mouth Two Times A Day 6)  Lantus Solostar 100 Unit/ml Soln (Insulin Glargine) .... Inject 30 Units Subcutaneously Once Daily At Bedtime 7)  Pen Needles 31g X 6 Mm Misc (Insulin Pen Needle) .... Use As Directed For Daily Insulin Injections 8)  Bepreve 1.5 % Soln (Bepotastine Besilate)  .... Instill One Drop Into The Affected Eyes Two Times A Day 9)  Prodigy Twist Top Lancets 28g  Misc (Lancets) .... Use To Test Blood Glucose Twice Daily 10)  Prodigy Autocode Blood Glucose  Strp (Glucose Blood) .... Use To Test Blood Glucose Twice Daily 11)  Prodigy Mini Pen Needles 31g X 5 Mm Misc (Insulin Pen Needle) .... Use To Inject Insulin Once Time A Day 12)  Aspirin 81 Mg Tbec (Aspirin) .... One By Mouth Daily  Allergies (verified): No Known Drug Allergies  Review of Systems  The patient denies anorexia, fever, weight loss, weight gain, vision loss, decreased hearing, hoarseness, chest pain, syncope, dyspnea on exertion, peripheral edema, prolonged cough, headaches, hemoptysis, abdominal pain, melena, hematochezia, severe indigestion/heartburn, hematuria, incontinence, genital sores, muscle weakness, suspicious skin lesions, transient blindness, difficulty walking, depression, unusual weight change, abnormal bleeding, enlarged lymph nodes, angioedema, breast masses, and testicular masses.    Physical Exam  General:  Gen: VS reveiwed, Alert, well developed, nodistress ENT: mucous membranes pink & moist. No abnormal finds in ear and nose. CVC:S1 S2 , no murmurs, no abnormal heart  sounds. Lungs: Clear to auscultation B/L. No wheezes, crackles or other abnormal sounds Abdomen: soft, non distended, no tender. Normal Bowel sounds EXT: no pitting edema, no engorged veins, Pulsations normal  Neuro:alert, oriented *3, cranial nerved 2-12 intact, strenght normal in all  extremities, senstations normal to light touch.     Diabetes Management Exam:    Foot Exam (with socks and/or shoes not present):       Sensory-Monofilament:          Left foot: normal          Right foot: normal   Impression & Recommendations:  Problem # 1:  DIABETES MELLITUS, TYPE II (ICD-250.00) patient had 2 episode of sever hypoglycemia requiring to call EMS since lantus was increase in 4/11 also, his average  CBGs at home is 80-90 and had freqeunt other episodes with CBG<70 at home his HbA1c has dropped to 6.4 from >10 in 4/11 will d/c glipizide at this time. conitnue lantus will ask him to call the clinic if any furhter readings less than 60 also, schedule an appointment with Dr. Marinda Elk in 1 month for re-assessment of DM meds and meter readings Patient has never been on metformin per his hsitroy, can be started if DM not controlled  The following medications were removed from the medication list:    Glucotrol 10 Mg Tabs (Glipizide) .Marland Kitchen... Take 2 tablets by mouth two times a day His updated medication list for this problem includes:    Lantus Solostar 100 Unit/ml Soln (Insulin glargine) ..... Inject 30 units subcutaneously once daily at bedtime    Aspirin 81 Mg Tbec (Aspirin) ..... One by mouth daily  Orders: T- Capillary Blood Glucose (82948) T-Hgb A1C (in-house) [id number])  Labs Reviewed: Creat: 1.66 (11/10/2009)     Last Eye Exam: No diabetic retinopathy.   Cataract OD. Exam by [name] L. Katy Fitch, MD   (05/07/2009) Reviewed HgBA1c results: 10.7 (12/29/2009)  9.3 (11/10/2009)  His updated medication list for this problem includes:    Glucotrol 10 Mg Tabs (Glipizide) .Marland Kitchen... Take 2 tablets by mouth two times a day    Lantus Solostar 100 Unit/ml Soln (Insulin glargine) ..... Inject 30 units subcutaneously once daily at bedtime    Aspirin 81 Mg Tbec (Aspirin) ..... One by mouth daily  His updated medication list for this problem includes:    Glucotrol 10 Mg Tabs (Glipizide) .Marland Kitchen... Take 2 tablets by mouth two times a day    Lantus Solostar 100 Unit/ml Soln (Insulin glargine) ..... Inject 30 units subcutaneously once daily at bedtime    Aspirin 81 Mg Tbec (Aspirin) ..... One by mouth daily  Complete Medication List: 1)  Simvastatin 40 Mg Tabs (Simvastatin) .... Take 1 tablet by mouth once a day 2)  Pantoprazole Sodium 40 Mg Tbec (Pantoprazole sodium) .... Take 1 tablet by mouth once a day 3)   Aggrenox 25-200 Mg Cp12 (Aspirin-dipyridamole) .... Take 1 tablet by mouth two times a day 4)  Coreg 25 Mg Tabs (Carvedilol) .... Take 1 tablet by mouth two times a day 5)  Lantus Solostar 100 Unit/ml Soln (Insulin glargine) .... Inject 30 units subcutaneously once daily at bedtime 6)  Pen Needles 31g X 6 Mm Misc (Insulin pen needle) .... Use as directed for daily insulin injections 7)  Bepreve 1.5 % Soln (Bepotastine besilate) .... Instill one drop into the affected eyes two times a day 8)  Prodigy Twist Top Lancets 28g Misc (Lancets) .... Use to test blood glucose twice daily 9)  Prodigy  Autocode Blood Glucose Strp (Glucose blood) .... Use to test blood glucose twice daily 10)  Prodigy Mini Pen Needles 31g X 5 Mm Misc (Insulin pen needle) .... Use to inject insulin once time a day 11)  Aspirin 81 Mg Tbec (Aspirin) .... One by mouth daily  Patient Instructions: 1)  Please schedule a follow-up appointment in 1 month with PCP 2)  Call the clinic if your home blood sugar is <60 3)  STOP GLIPIZDE NOW 4)  BRING YOUR METER NEXT TIME   Orders Added: 1)  T- Capillary Blood Glucose [82948] 2)  T-Hgb A1C (in-house) [id number]     Prevention & Chronic Care Immunizations   Influenza vaccine: refused  (09/30/2008)   Influenza vaccine deferral: Refused  (11/10/2009)   Influenza vaccine due: 05/26/2009    Tetanus booster: 11/10/2009: Td    Pneumococcal vaccine: Pneumovax (Medicare)  (12/25/2007)    H. zoster vaccine: Not documented   H. zoster vaccine deferral: Deferred  (05/12/2009)  Colorectal Screening   Hemoccult: Not documented   Hemoccult action/deferral: Ordered  (11/10/2009)    Colonoscopy: Results: 16 mm polyp in the transverse colon, resected and retrieved. Pathology showed tubular adenoma and fragments of benign colonic mucosa; no high grade dysplasia or malignancy identified. Exam by Dr. Teena Irani  (07/21/2009)   Colonoscopy action/deferral: GI referral  (03/10/2009)    Colonoscopy due: 07/2014  Other Screening   PSA: 5.53  (03/10/2009)   PSA action/deferral: Discussed-PSA requested  (03/10/2009)   Smoking status: quit  (07/13/2010)  Diabetes Mellitus   HgbA1C: 6.4  (07/13/2010)   HgbA1C action/deferral: Ordered  (05/12/2009)    Eye exam: No diabetic retinopathy.   Cataract OD. Exam by [name] L. Katy Fitch, MD    (05/07/2009)   Eye exam due: 04/2010    Foot exam: yes  (07/13/2010)   Foot exam action/deferral: Do today   High risk foot: Yes  (07/13/2010)   Foot care education: Done  (07/13/2010)   Foot exam due: 02/07/2010    Urine microalbumin/creatinine ratio: 38.5  (03/10/2009)   Urine microalbumin action/deferral: Ordered  Lipids   Total Cholesterol: 149  (11/10/2009)   Lipid panel action/deferral: Lipid Panel ordered   LDL: 82  (11/10/2009)   LDL Direct: Not documented   HDL: 55  (11/10/2009)   Triglycerides: 62  (11/10/2009)    SGOT (AST): 27  (11/10/2009)   BMP action: Ordered   SGPT (ALT): 14  (11/10/2009)   Alkaline phosphatase: 56  (11/10/2009)   Total bilirubin: 0.5  (11/10/2009)  Self-Management Support :   Personal Goals (by the next clinic visit) :     Personal A1C goal: 7  (03/10/2009)     Personal blood pressure goal: 130/80  (03/10/2009)     Personal LDL goal: 70  (03/10/2009)     Home glucose monitoring frequency: 1 time daily  (03/10/2009)    Diabetes self-management support: Written self-care plan  (12/29/2009)    Diabetes self-management support not done because: Good outcomes  (12/29/2009)   Last diabetes self-management training by diabetes educator: 01/17/2010   Last medical nutrition therapy: 01/02/2008    Lipid self-management support: Written self-care plan  (12/29/2009)    Nursing Instructions: Diabetic foot exam today    Laboratory Results   Blood Tests   Date/Time Received: July 13, 2010 4:15 PM  Date/Time Reported: Maryan Rued  July 13, 2010 4:15 PM   HGBA1C: 6.4%   (Normal  Range: Non-Diabetic - 3-6%   Control Diabetic - 6-8%) CBG Random:: 177mg /dL

## 2010-10-27 NOTE — Progress Notes (Signed)
Summary: diabetes supplies/dmr  Phone Note Refill Request Message from:  Barnabas Harries on January 18, 2010 8:50 AM  Arby Barrette meter yesterday so that supplies will be no charge at [employer]. Patient requests refills to test twice dilay     New/Updated Medications: PRODIGY TWIST TOP LANCETS 28G  MISC (LANCETS) use to test blood glucose twice daily PRODIGY AUTOCODE BLOOD GLUCOSE  STRP (GLUCOSE BLOOD) use to test blood glucose twice daily PRODIGY MINI PEN NEEDLES 31G X 5 MM MISC (INSULIN PEN NEEDLE) use to inject insulin once time a day Prescriptions: PRODIGY MINI PEN NEEDLES 31G X 5 MM MISC (INSULIN PEN NEEDLE) use to inject insulin once time a day  #100 x 3   Entered by:   Barnabas Harries RD,CDE   Authorized by:   Milta Deiters MD   Signed by:   Milta Deiters MD on 01/18/2010   Method used:   Electronically to        [facility] (retail)       [place], [place]  [address]       Ph: [phone]       Fax: [phone]   [id number]   [phone] PRODIGY AUTOCODE BLOOD GLUCOSE  STRP (GLUCOSE BLOOD) use to test blood glucose twice daily  #100 x 8   Entered by:   Barnabas Harries RD,CDE   Authorized by:   Milta Deiters MD   Signed by:   Milta Deiters MD on 01/18/2010   Method used:   Electronically to        [facility] (retail)       [address] [place], [place]  [address]       Ph: [phone]       Fax: [phone]   [id number]   [phone] PRODIGY TWIST TOP LANCETS 28G  MISC (LANCETS) use to test blood glucose twice daily  #100 x 7   Entered by:   Barnabas Harries RD,CDE   Authorized by:   Milta Deiters MD   Signed by:   Milta Deiters MD on 01/18/2010   Method used:   Electronically to        [facility] (retail)       [address] [place], [place]  [address]       Ph: [phone]       Fax: [phone]   [id number]

## 2010-10-27 NOTE — Miscellaneous (Signed)
  Clinical Lists Changes  I received a notice from Medicare that they will no longer cover Protonix but will cover generic pantoprazole.  I changed the medication list accordingly and sent a new prescription to the pharmacy.   [name] MD  November 03, 2009 3:03 PM   Medications: Changed medication from PROTONIX 40 MG TBEC (PANTOPRAZOLE SODIUM) Take 1 tablet by mouth once a day to PANTOPRAZOLE SODIUM 40 MG TBEC (PANTOPRAZOLE SODIUM) Take 1 tablet by mouth once a day - Signed Rx of PANTOPRAZOLE SODIUM 40 MG TBEC (PANTOPRAZOLE SODIUM) Take 1 tablet by mouth once a day;  #31 x 11;  Signed;  Entered by: [name] MD;  Authorized by: [name] MD;  Method used: Electronically to [facility] [address] [place], [place], [place]  [address], Ph: [phone], Fax: [phone]    Prescriptions: PANTOPRAZOLE SODIUM 40 MG TBEC (PANTOPRAZOLE SODIUM) Take 1 tablet by mouth once a day  #31 x 11   Entered and Authorized by:   [name] MD   Signed by:   [name] MD on 11/03/2009   Method used:   Electronically to        [facility] (retail)       [address] [place], [place]  [address]       Ph: [phone]       Fax: [phone]   [id number]

## 2010-10-27 NOTE — Consult Note (Signed)
Summary: [facility] EAR NOSE [facility]ROAT  [facility]RO[facility]   Imported By: Garlan Fillers 09/09/2010 10:30:53  _____________________________________________________________________  External Attachment:    Type:   Image     Comment:   External Document

## 2010-10-27 NOTE — Assessment & Plan Note (Signed)
Summary: PC2/ GD  Medications Added ACETAMINOPHEN-CODEINE #3 300-30 MG TABS (ACETAMINOPHEN-CODEINE) Take 1 tablet by mouth two times a day as needed for pain        Visit Type:  Follow-up Primary Provider:  [name] MD   History of Present Illness: [name] returns today for followup.  He is a very pleasant elderly male with an ischemic cardiomyopathy, congestive heart failure who is s/p ICD in 2005.   He returns today for followup.  He had no specific complaints today.  He denied chest pain.  He denied shortness of breath.  He denies peripheral edema.  He states that he feels much better than he did previously.  He is walking several times a week. No intercurrent ICD therapies.  Current Medications (verified): 1)  Simvastatin 80 Mg Tabs (Simvastatin) .... Take 1 Tablet By Mouth Once A Day 2)  Glucotrol 10 Mg Tabs (Glipizide) .... Take 2 Tablets By Mouth Two Times A Day 3)  Pantoprazole Sodium 40 Mg Tbec (Pantoprazole Sodium) .... Take 1 Tablet By Mouth Once A Day 4)  Aggrenox 25-200 Mg Cp12 (Aspirin-Dipyridamole) .... Take 1 Tablet By Mouth Two Times A Day 5)  Coreg 25 Mg Tabs (Carvedilol) .... Take 1 Tablet By Mouth Two Times A Day 6)  Freestyle Lite   Strp (Glucose Blood) .... Use To Test Blood Glucose Twice Daily 7)  Lancets   Misc (Lancets) .... Use To Test Blood Glucose Twice Daily 8)  Lantus Solostar 100 Unit/ml Soln (Insulin Glargine) .... Inject 26 Units Subcutaneously Once Daily At Bedtime 9)  Pen Needles 31g X 6 Mm Misc (Insulin Pen Needle) .... Use As Directed For Daily Insulin Injections 10)  Acetaminophen-Codeine #3 300-30 Mg Tabs (Acetaminophen-Codeine) .... Take 1 Tablet By Mouth Two Times A Day As Needed For Pain  Allergies: 1)  ! Enalapril Maleate (Enalapril Maleate)  Past History:  Past Medical History: Last updated: 05/12/2009 Anemia-iron deficiency Cataracts Colonic polyps, hx of Congestive heart failure, ischemci CM EF 15-20% S/P AICD  05/24/04 Coronary artery disease s/p AMI, s/p PTCA and stent of  cx 12/04 restent 5/05 Diabetes mellitus, type II Diverticulosis, colon Hyperlipidemia Peripheral vascular disease s/p L carotid PTCA/stent 2004 Transient ischemic attack, hx of Erectile dysfunction Hyperkalemia with K=5.7 08/2008  Review of Systems  The patient denies chest pain, syncope, dyspnea on exertion, and peripheral edema.    Vital Signs:  Patient profile:   77 year old male Height:      70 inches Weight:      189 pounds BMI:     27.22 Pulse rate:   56 / minute BP sitting:   130 / 60  (left arm)  Vitals Entered By: Margaretmary Bayley CMA (December 07, 2009 5:01 PM)  Physical Exam  General:  Well developed, well nourished, in no acute distress.  HEENT: normal Neck: supple. 7 cm JVD. Carotids 2+ bilaterally no bruits Cor: RRR no rubs, gallops or murmur Lungs: CTA. Well healed ICD incision. Ab: soft, nontender. nondistended. No HSM. Good bowel sounds Ext: warm. no cyanosis, clubbing or edema Neuro: alert and oriented. Grossly nonfocal. affect pleasant     ICD Follow Up Remote Check?  No Battery Voltage:  2.59 V     Charge Time:  10.5 seconds     Underlying rhythm:  SR   ICD Device Measurements Right Ventricle:  Amplitude: 12 mV, Impedance: 521 ohms, Threshold: 1.0 V at 0.4 msec  Episodes Coumadin:  No Shock:  0     ATP:  0     Nonsustained:  2     Ventricular Pacing:  1%  Brady Parameters Mode VVI     Lower Rate Limit:  40      Tachy Zones VF:  200     VT:  240     VT1:  167     Next Cardiology Appt Due:  02/23/2010 Tech Comments:  No parameter changes.  [phone] lead but unable to assess status because of device model.   Checked by [employer].  ROV 3 months clinic. Alma Friendly, LPN  March 15, 624THL [id number] PM  MD Comments:  He is approaching but not yet at [facility].  Normal device function.  Impression & Recommendations:  Problem # 1:  IMPLANTATION OF DEFIBRILLATOR, HX OF (ICD-V45.02) His device is working  normally and we will recheck in several months.  Problem # 2:  ISCHEMIC CARDIOMYOPATHY (ICD-414.8) He denies anginal symptoms.  Continue current meds. His updated medication list for this problem includes:    Aggrenox 25-200 Mg Cp12 (Aspirin-dipyridamole) .Marland Kitchen... Take 1 tablet by mouth two times a day    Coreg 25 Mg Tabs (Carvedilol) .Marland Kitchen... Take 1 tablet by mouth two times a day  Problem # 3:  CONGESTIVE HEART FAILURE (ICD-428.0) He remains class 2.  Continue current meds and maintain a low sodium diet. His updated medication list for this problem includes:    Aggrenox 25-200 Mg Cp12 (Aspirin-dipyridamole) .Marland Kitchen... Take 1 tablet by mouth two times a day    Coreg 25 Mg Tabs (Carvedilol) .Marland Kitchen... Take 1 tablet by mouth two times a day

## 2010-10-27 NOTE — Progress Notes (Signed)
Summary: diabetes support/dmr  Phone Note Call from Patient Call back at Home Phone [phone]   Caller: Patient Summary of Call: left voicemail: my diabetes check was 194. returned call and left message to acknowledge CBG report and likely successful insulin administration. asked patient to call Friday with CBgs as well.  Initial call taken by: Barnabas Harries RD,CDE,  January 18, 2010 12:58 PM

## 2010-10-27 NOTE — Progress Notes (Signed)
Summary: med refill/gp  Phone Note Refill Request Message from:  Fax from Pharmacy on July 04, 2010 2:35 PM  Refills Requested: Medication #1:  AGGRENOX 25-200 MG CP12 Take 1 tablet by mouth two times a day   Last Refilled: 05/31/2010 Last appt. 4/6; next appt. 11/16.   Method Requested: Electronic Initial call taken by: Morrison Old RN,  July 04, 2010 2:35 PM  Follow-up for Phone Call        Refilled electronically.  Follow-up by: [name] MD,  July 04, 2010 4:47 PM    Prescriptions: AGGRENOX 25-200 MG CP12 (ASPIRIN-DIPYRIDAMOLE) Take 1 tablet by mouth two times a day  #62 Capsule x 6   Entered and Authorized by:   [name] MD   Signed by:   [name] MD on 07/04/2010   Method used:   Electronically to        [facility] (retail)       [address] [place], [place]  [address]       Ph: [phone]       Fax: [phone]   [id number]

## 2010-10-27 NOTE — Progress Notes (Signed)
Summary: calling in CBGs with new Lantus dose/dmr  Phone Note Call from Patient Call back at Home Phone [phone]   Caller: Patient Summary of Call: patient calling with fasting CBGs since he is now taking his insulin correctly. Friday CBG was 188, Sunday was 123, today was 79. Told his to cut dose back to 30 units tonight if he did not hear otherwise from his doctor or our office by today. Ryun verbalized understadning to decrease his insulin dose tonight if he doesn't hear back from our office. We should probalby follow-up again in a week or so to be sure patient in safe range of CBgs for 77 year old.   Will route this note to DR.Hazen Brumett   Follow-up for Phone Call        I tried to reach patient by phone but was unble to do so.  It is not clear to me whether these are fasting or random blood sugars.  I agree with the plan to reduce the Lantus dose to 30 units; I would also have patient call in his blood sugars before the end of this week to let us know how he is doing.  He should call immediately if he has any lower values or any symptoms of hypoglycemia. Follow-up by: [name] MD,  Jan 24, 2010 4:19 PM    New/Updated Medications: LANTUS SOLOSTAR 100 UNIT/ML SOLN (INSULIN GLARGINE) Inject 30 units subcutaneously once daily at bedtime  Appended Document: calling in CBGs with new Lantus dose/dmr    Phone Note Outgoing Call   Call placed by: Barnabas Harries RD,CDE,  Jan 24, 2010 5:00 PM Summary of Call: These are all fasting blood sugars. I called patient and left a message for him to call again at the end fo this week with fasting CBgs and to call if any CBGs are less than 70 or he has symptoms of hypoglycemia.

## 2010-10-27 NOTE — Procedures (Signed)
Summary: pacer check  Medications Added ASPIRIN 81 MG TBEC (ASPIRIN) one by mouth daily      Allergies Added: NKDA  Current Medications (verified): 1)  Simvastatin 80 Mg Tabs (Simvastatin) .... Take 1 Tablet By Mouth Once A Day 2)  Glucotrol 10 Mg Tabs (Glipizide) .... Take 2 Tablets By Mouth Two Times A Day 3)  Pantoprazole Sodium 40 Mg Tbec (Pantoprazole Sodium) .... Take 1 Tablet By Mouth Once A Day 4)  Aggrenox 25-200 Mg Cp12 (Aspirin-Dipyridamole) .... Take 1 Tablet By Mouth Two Times A Day 5)  Coreg 25 Mg Tabs (Carvedilol) .... Take 1 Tablet By Mouth Two Times A Day 6)  Lantus Solostar 100 Unit/ml Soln (Insulin Glargine) .... Inject 30 Units Subcutaneously Once Daily At Bedtime 7)  Pen Needles 31g X 6 Mm Misc (Insulin Pen Needle) .... Use As Directed For Daily Insulin Injections 8)  Bepreve 1.5 % Soln (Bepotastine Besilate) .... Instill One Drop Into The Affected Eyes Two Times A Day 9)  Prodigy Twist Top Lancets 28g  Misc (Lancets) .... Use To Test Blood Glucose Twice Daily 10)  Prodigy Autocode Blood Glucose  Strp (Glucose Blood) .... Use To Test Blood Glucose Twice Daily 11)  Prodigy Mini Pen Needles 31g X 5 Mm Misc (Insulin Pen Needle) .... Use To Inject Insulin Once Time A Day 12)  Aspirin 81 Mg Tbec (Aspirin) .... One By Mouth Daily  Allergies (verified): No Known Drug Allergies   ICD Specifications Following MD:  Cristopher Peru, MD     ICD Vendor:  Medtronic     ICD Model Number:  [phone]     ICD Serial Number:  [id number] ICD DOI:  05/24/2004     ICD Implanting MD:  Cristopher Peru, MD  Lead 1:    Location: rv     DOI: 05/24/2004     Model #: [id number]     Serial #[id number]     Status: active  ICD Follow Up Battery Voltage:  2.59 V     Charge Time:  10.63 seconds     Underlying rhythm:  SR   ICD Device Measurements Right Ventricle:  Amplitude: 10 mV, Impedance: 481 ohms, Threshold: 1.0 V at 0.2 msec Shock Impedance: 17 ohms   Episodes MS Episodes:  0     Coumadin:   No Shock:  0     ATP:  0     Nonsustained:  0     Atrial Therapies:  0 Ventricular Pacing:  0%  Brady Parameters Mode VVI     Lower Rate Limit:  40      Tachy Zones VF:  200     VT:  240     VT1:  167     Next Cardiology Appt Due:  08/26/2010 Tech Comments:  NORMAL DEVICE FUNCTION.  NO EPISODES SINCE LAST CHECK.  NO CHANGES MADE.  BATTERY VOLTAGE 2.59 V--PER MDT 5-8 MTHS TO ERI. ROV IN 3 MTHS W/DEVICE CLINIC. Shelly Bombard  June 13, 2010 11:43 AM

## 2010-10-27 NOTE — Letter (Signed)
Summary: Appointment - Missed  [facility], [place]  [address] [place]   [place], [place] [address]   Phone: [phone]  Fax: [phone]     September 09, 2010 MRN: [id number]   [name] [address] [place],   [address]   Dear Mr. PAPPA,  [place] records indicate you missed your appointment on 12.-15-11 with the [facility]. It is very important that we reach you to reschedule this appointment. We look forward to participating in your health care needs. Please contact us at the number listed above at your earliest convenience to reschedule this appointment.     Sincerely,    Public relations account executive

## 2010-10-27 NOTE — Assessment & Plan Note (Signed)
Summary: 6WK F/U/EST/VS   Vital Signs:  Patient profile:   77 year old male Height:      70 inches Weight:      183.6 pounds BMI:     26.44 Temp:     96.5 degrees F oral Pulse rate:   62 / minute BP sitting:   108 / 64  (right arm)  Vitals Entered By: Silverio Decamp NT II (December 29, 2009 9:23 AM) CC: follow-up visit Is Patient Diabetic? Yes Pain Assessment Patient in pain? no      Nutritional Status BMI of 25 - 29 = overweight  Have you ever been in a relationship where you felt threatened, hurt or afraid?No   Does patient need assistance? Functional Status Self care Ambulation Normal   Primary Care Provider:  [name] MD  CC:  follow-up visit.  History of Present Illness: Patient returns today for follow up of his diabetes mellitus, hyperlipidemia, decreased hearing, ischemic cardiomyopathy, and other chronic medical problems.  He has no acute complaints today. He reports that he is compliant with his medications. He has not been checking his blood sugars at home, and has not yet seen our diabetes educator Barnabas Harries.  Preventive Screening-Counseling & Management  Alcohol-Tobacco     Alcohol drinks/day: 0     Smoking Status: quit     Year Quit: 50 years  Caffeine-Diet-Exercise     Does Patient Exercise: yes     Type of exercise: Walking     Times/week: 7  Current Medications (verified): 1)  Simvastatin 80 Mg Tabs (Simvastatin) .... Take 1 Tablet By Mouth Once A Day 2)  Glucotrol 10 Mg Tabs (Glipizide) .... Take 2 Tablets By Mouth Two Times A Day 3)  Pantoprazole Sodium 40 Mg Tbec (Pantoprazole Sodium) .... Take 1 Tablet By Mouth Once A Day 4)  Aggrenox 25-200 Mg Cp12 (Aspirin-Dipyridamole) .... Take 1 Tablet By Mouth Two Times A Day 5)  Coreg 25 Mg Tabs (Carvedilol) .... Take 1 Tablet By Mouth Two Times A Day 6)  Freestyle Lite   Strp (Glucose Blood) .... Use To Test Blood Glucose Twice Daily 7)  Lancets   Misc (Lancets) .... Use To Test Blood Glucose Twice  Daily 8)  Lantus Solostar 100 Unit/ml Soln (Insulin Glargine) .... Inject 26 Units Subcutaneously Once Daily At Bedtime 9)  Pen Needles 31g X 6 Mm Misc (Insulin Pen Needle) .... Use As Directed For Daily Insulin Injections 10)  Acetaminophen-Codeine #3 300-30 Mg Tabs (Acetaminophen-Codeine) .... Take 1 Tablet By Mouth Two Times A Day As Needed For Pain 11)  Bepreve 1.5 % Soln (Bepotastine Besilate) .... Instill One Drop Into The Affected Eyes Two Times A Day  Allergies (verified): 1)  ! Enalapril Maleate (Enalapril Maleate)  Review of Systems CV:  Denies chest pain or discomfort, difficulty breathing at night, difficulty breathing while lying down, and swelling of feet. Resp:  Denies shortness of breath. GI:  Denies abdominal pain, nausea, and vomiting. MS:  Denies muscle aches and cramps.  Physical Exam  General:  alert, no distress Lungs:  normal respiratory effort, normal breath sounds, no crackles, and no wheezes.   Heart:  normal rate, regular rhythm, no murmur, no gallop, and no rub.   Abdomen:  soft, non-tender, normal bowel sounds, no hepatomegaly, and no splenomegaly.   Extremities:  no edema   Impression & Recommendations:  Problem # 1:  DIABETES MELLITUS, TYPE II (ICD-250.00) Patient's diabetes is poorly controlled; he reports that he has been  taking his medications as prescribed. The plan is to increase his Lantus insulin to 32 units at bedtime, and I again advised him to followup with Barnabas Harries for diabetes self-management training. He reports that he needs a new glucose meter, and hopefully once he is monitoring his blood sugars we can better adjust his medication regimen.  His updated medication list for this problem includes:    Glucotrol 10 Mg Tabs (Glipizide) .Marland Kitchen... Take 2 tablets by mouth two times a day    Lantus Solostar 100 Unit/ml Soln (Insulin glargine) ..... Inject 32 units subcutaneously once daily at bedtime  Labs Reviewed: Creat: 1.66 (11/10/2009)      Last Eye Exam: No diabetic retinopathy.   Cataract OD. Exam by [name] L. Katy Fitch, MD   (05/07/2009) Reviewed HgBA1c results: 10.7 (12/29/2009)  9.3 (11/10/2009)  Orders: T-Hgb A1C (in-house) [id number]) T- Capillary Blood Glucose [id number])  Problem # 2:  HYPERLIPIDEMIA (ICD-272.4) The patient's LDL is at goal; the plan is to continue current dose of simvastatin.  His updated medication list for this problem includes:    Simvastatin 80 Mg Tabs (Simvastatin) .Marland Kitchen... Take 1 tablet by mouth once a day  Labs Reviewed: SGOT: 27 (11/10/2009)   SGPT: 14 (11/10/2009)   HDL:55 (11/10/2009), 53 (09/30/2008)  LDL:82 (11/10/2009), 90 (09/30/2008)  Chol:149 (11/10/2009), 160 (09/30/2008)  Trig:62 (11/10/2009), 85 (09/30/2008)  Problem # 3:  ISCHEMIC CARDIOMYOPATHY (ICD-414.8) Patient is doing well on current regimen, with no ischemic symptoms.  His updated medication list for this problem includes:    Aggrenox 25-200 Mg Cp12 (Aspirin-dipyridamole) .Marland Kitchen... Take 1 tablet by mouth two times a day    Coreg 25 Mg Tabs (Carvedilol) .Marland Kitchen... Take 1 tablet by mouth two times a day  Labs Reviewed: Chol: 149 (11/10/2009)   HDL: 55 (11/10/2009)   LDL: 82 (11/10/2009)   TG: 62 (11/10/2009)  Problem # 4:  RENAL INSUFFICIENCY (ICD-588.9) The patient's last creatinine in February was at baseline range.  Problem # 5:  DECREASED HEARING, BILATERAL (ICD-389.9) Patient had an audiological evaluation on December 01, 2009 which showed asymmetric hearing loss but with features that did not suggest retrocochlear pathology; a followup hearing study in 6 months was advised, along with referral for a hearing aid which was made by the audiologist.  Plan is to repeat an audiological study in 6 months.  Problem # 6:  PROSTATE SPECIFIC ANTIGEN, ELEVATED (ICD-790.93) Patient saw urologist Dr. Kathie Rhodes on December 16, 1999, and his impression was that patient's elevated PSA was due to his BPH; he planned to repeat the PSA and follow  Mr. Bruck with return visit in 6 months.  Complete Medication List: 1)  Simvastatin 80 Mg Tabs (Simvastatin) .... Take 1 tablet by mouth once a day 2)  Glucotrol 10 Mg Tabs (Glipizide) .... Take 2 tablets by mouth two times a day 3)  Pantoprazole Sodium 40 Mg Tbec (Pantoprazole sodium) .... Take 1 tablet by mouth once a day 4)  Aggrenox 25-200 Mg Cp12 (Aspirin-dipyridamole) .... Take 1 tablet by mouth two times a day 5)  Coreg 25 Mg Tabs (Carvedilol) .... Take 1 tablet by mouth two times a day 6)  Freestyle Lite Strp (Glucose blood) .... Use to test blood glucose twice daily 7)  Lancets Misc (Lancets) .... Use to test blood glucose twice daily 8)  Lantus Solostar 100 Unit/ml Soln (Insulin glargine) .... Inject 32 units subcutaneously once daily at bedtime 9)  Pen Needles 31g X 6 Mm Misc (Insulin pen needle) .... Use as  directed for daily insulin injections 10)  Acetaminophen-codeine #3 300-30 Mg Tabs (Acetaminophen-codeine) .... Take 1 tablet by mouth two times a day as needed for pain 11)  Bepreve 1.5 % Soln (Bepotastine besilate) .... Instill one drop into the affected eyes two times a day  Patient Instructions: 1)  Please schedule a follow-up appointment in 1 month. 2)  Please make an appointment with diabetes educator Barnabas Harries for diabetes self-management training. 3)  Increase Lantus insulin to 32 units once daily at bedtime.  Prescriptions: LANTUS SOLOSTAR 100 UNIT/ML SOLN (INSULIN GLARGINE) Inject 32 units subcutaneously once daily at bedtime  #20 mL x 6   Entered and Authorized by:   [name] MD   Signed by:   [name] MD on 12/29/2009   Method used:   Electronically to        [facility] (retail)       [address] [place], [place]  [address]       Ph: [phone]       Fax: [phone]   [id number] GLUCOTROL 10 MG TABS (GLIPIZIDE) Take 2 tablets by mouth two times a day  #120 Tablet x 6   Entered and  Authorized by:   [name] MD   Signed by:   [name] MD on 12/29/2009   Method used:   Electronically to        [facility] (retail)       [address] [place], [place]  [address]       Ph: [phone]       Fax: [phone]   RxID:   [id number]   Prevention & Chronic Care Immunizations   Influenza vaccine: refused  (09/30/2008)   Influenza vaccine deferral: Refused  (11/10/2009)   Influenza vaccine due: 05/26/2009    Tetanus booster: 11/10/2009: Td    Pneumococcal vaccine: Pneumovax (Medicare)  (12/25/2007)    H. zoster vaccine: Not documented   H. zoster vaccine deferral: Deferred  (05/12/2009)  Colorectal Screening   Hemoccult: Not documented   Hemoccult action/deferral: Ordered  (11/10/2009)    Colonoscopy: Results: 16 mm polyp in the transverse colon, resected and retrieved. Pathology showed tubular adenoma and fragments of benign colonic mucosa; no high grade dysplasia or malignancy identified. Exam by Dr. Teena Irani  (07/21/2009)   Colonoscopy action/deferral: GI referral  (03/10/2009)   Colonoscopy due: 07/2014  Other Screening   PSA: 5.53  (03/10/2009)   PSA action/deferral: Discussed-PSA requested  (03/10/2009)  Reports requested:  Smoking status: quit  (12/29/2009)  Diabetes Mellitus   HgbA1C: 10.7  (12/29/2009)   HgbA1C action/deferral: Ordered  (05/12/2009)    Eye exam: No diabetic retinopathy.   Cataract OD. Exam by [name] L. Katy Fitch, MD    (05/07/2009)   Last eye exam report requested.   Eye exam due: 04/2010    Foot exam: yes  (11/10/2009)   Foot exam action/deferral: Do today   High risk foot: Yes  (11/10/2009)   Foot care education: Done  (11/10/2009)   Foot exam due: 02/07/2010    Urine microalbumin/creatinine ratio: 38.5  (03/10/2009)   Urine microalbumin action/deferral: Ordered    Diabetes flowsheet reviewed?: Yes   Progress toward A1C goal: Deteriorated  Lipids   Total  Cholesterol: 149  (11/10/2009)  Lipid panel action/deferral: Lipid Panel ordered   LDL: 82  (11/10/2009)   LDL Direct: Not documented   HDL: 55  (11/10/2009)   Triglycerides: 62  (11/10/2009)    SGOT (AST): 27  (11/10/2009)   BMP action: Ordered   SGPT (ALT): 14  (11/10/2009)   Alkaline phosphatase: 56  (11/10/2009)   Total bilirubin: 0.5  (11/10/2009)    Lipid flowsheet reviewed?: Yes   Progress toward LDL goal: At goal  Self-Management Support :   Personal Goals (by the next clinic visit) :     Personal A1C goal: 7  (03/10/2009)     Personal blood pressure goal: 130/80  (03/10/2009)     Personal LDL goal: 70  (03/10/2009)    Patient will work on the following items until the next clinic visit to reach self-care goals:     Medications and monitoring: take my medicines every day, bring all of my medications to every visit  (12/29/2009)     Eating: drink diet soda or water instead of juice or soda, eat more vegetables, eat foods that are low in salt, eat baked foods instead of fried foods, eat fruit for snacks and desserts  (12/29/2009)     Activity: take a 30 minute walk every day  (12/29/2009)     Home glucose monitoring frequency: 1 time daily  (03/10/2009)    Diabetes self-management support: Written self-care plan  (12/29/2009)   Diabetes care plan printed    Diabetes self-management support not done because: Good outcomes  (12/29/2009)   Last medical nutrition therapy: 01/02/2008    Lipid self-management support: Written self-care plan  (12/29/2009)   Lipid self-care plan printed.   Nursing Instructions: Request report of last diabetic eye exam - Dr. Katy Fitch.    Laboratory Results   Blood Tests   Date/Time Received: December 29, 2009 10:00 AM  Date/Time Reported: Lenoria Farrier  December 29, 2009 10:00 AM   HGBA1C: 10.7%   (Normal Range: Non-Diabetic - 3-6%   Control Diabetic - 6-8%) CBG Fasting:: 278mg /dL

## 2010-10-27 NOTE — Progress Notes (Signed)
Summary: med refill/gp  Phone Note Refill Request Message from:  Fax from Pharmacy on December 09, 2009 2:47 PM  Refills Requested: Medication #1:  SIMVASTATIN 80 MG TABS Take 1 tablet by mouth once a day   Last Refilled: 11/04/2009  Method Requested: Electronic Initial call taken by: Morrison Old RN,  December 09, 2009 2:47 PM  Follow-up for Phone Call        Refilled electronically.  Follow-up by: [name] MD,  December 10, 2009 11:31 AM    Prescriptions: SIMVASTATIN 80 MG TABS (SIMVASTATIN) Take 1 tablet by mouth once a day  #31 x 6   Entered and Authorized by:   [name] MD   Signed by:   [name] MD on 12/10/2009   Method used:   Electronically to        [facility] (retail)       [address] [place], [place]  [address]       Ph: [phone]       Fax: [phone]   RxID:   [id number]

## 2010-10-31 ENCOUNTER — Other Ambulatory Visit: Payer: Self-pay | Admitting: Internal Medicine

## 2010-11-10 ENCOUNTER — Encounter: Payer: Self-pay | Admitting: Internal Medicine

## 2010-11-10 ENCOUNTER — Encounter (INDEPENDENT_AMBULATORY_CARE_PROVIDER_SITE_OTHER): Payer: PRIVATE HEALTH INSURANCE

## 2010-11-10 DIAGNOSIS — I428 Other cardiomyopathies: Secondary | ICD-10-CM

## 2010-11-16 NOTE — Cardiovascular Report (Signed)
Summary: Certified Letter Signed - Other (not doing f/u)  Certified Letter Signed - Other (not doing f/u)   Imported By: Ranell Patrick 11/07/2010 11:49:51  _____________________________________________________________________  External Attachment:    Type:   Image     Comment:   External Document

## 2010-11-22 NOTE — Cardiovascular Report (Signed)
Summary: Office Visit   Office Visit   Imported By: Sallee Provencal 11/16/2010 15:54:02  _____________________________________________________________________  External Attachment:    Type:   Image     Comment:   External Document

## 2010-11-22 NOTE — Procedures (Signed)
Summary: per pt call-mj      Allergies Added: NKDA  Current Medications (verified): 1)  Simvastatin 40 Mg Tabs (Simvastatin) .... Take 1 Tablet By Mouth Once A Day 2)  Pantoprazole Sodium 40 Mg Tbec (Pantoprazole Sodium) .... Take 1 Tablet By Mouth Once A Day 3)  Aggrenox 25-200 Mg Cp12 (Aspirin-Dipyridamole) .... Take 1 Tablet By Mouth Two Times A Day 4)  Coreg 25 Mg Tabs (Carvedilol) .... Take 1 Tablet By Mouth Two Times A Day 5)  Lantus Solostar 100 Unit/ml Soln (Insulin Glargine) .... Inject 30 Units Subcutaneously Once Daily At Bedtime 6)  Pen Needles 31g X 6 Mm Misc (Insulin Pen Needle) .... Use As Directed For Daily Insulin Injections 7)  Bepreve 1.5 % Soln (Bepotastine Besilate) .... Instill One Drop Into The Affected Eyes Two Times A Day 8)  Prodigy Twist Top Lancets 28g  Misc (Lancets) .... Use To Test Blood Glucose Twice Daily 9)  Prodigy Autocode Blood Glucose  Strp (Glucose Blood) .... Use To Test Blood Glucose Twice Daily 10)  Prodigy Mini Pen Needles 31g X 5 Mm Misc (Insulin Pen Needle) .... Use To Inject Insulin Once Time A Day 11)  Aspirin 81 Mg Tbec (Aspirin) .... One By Mouth Daily  Allergies (verified): No Known Drug Allergies   ICD Specifications Following MD:  Cristopher Peru, MD     ICD Vendor:  Medtronic     ICD Model Number:  [id number]     ICD Serial Number:  [id number] ICD DOI:  05/24/2004     ICD Implanting MD:  Cristopher Peru, MD  Lead 1:    Location: rv     DOI: 05/24/2004     Model #: [id number]     Serial #: [id number]     Status: active  Episodes Coumadin:  No  Brady Parameters Mode VVI     Lower Rate Limit:  40      Tachy Zones VF:  200     VT:  240     VT1:  167     Tech Comments:  See PaceArt

## 2010-11-27 ENCOUNTER — Other Ambulatory Visit: Payer: Self-pay | Admitting: Internal Medicine

## 2010-11-28 NOTE — Telephone Encounter (Signed)
Please schedule an appointment with me.  °

## 2010-12-07 LAB — GLUCOSE, CAPILLARY: Glucose-Capillary: 177 mg/dL — ABNORMAL HIGH (ref 70–99)

## 2010-12-08 LAB — CBC
HCT: 41.3 % (ref 39.0–52.0)
MCV: 95.4 fL (ref 78.0–100.0)
RBC: 4.33 MIL/uL (ref 4.22–5.81)
WBC: 7.3 10*3/uL (ref 4.0–10.5)

## 2010-12-08 LAB — GLUCOSE, CAPILLARY: Glucose-Capillary: 171 mg/dL — ABNORMAL HIGH (ref 70–99)

## 2010-12-08 LAB — BASIC METABOLIC PANEL
Chloride: 112 mEq/L (ref 96–112)
GFR calc Af Amer: 58 mL/min — ABNORMAL LOW (ref >60)
Potassium: 4.7 mEq/L (ref 3.5–5.1)

## 2010-12-08 LAB — DIFFERENTIAL
Eosinophils Relative: 2 % (ref 0–5)
Lymphocytes Relative: 31 % (ref 12–46)
Lymphs Abs: 2.3 10*3/uL (ref 0.7–4.0)
Monocytes Absolute: 1 10*3/uL (ref 0.1–1.0)
Monocytes Relative: 14 % — ABNORMAL HIGH (ref 3–12)

## 2010-12-13 LAB — POCT CARDIAC MARKERS
CKMB, poc: 3.2 ng/mL (ref 1.0–8.0)
Myoglobin, poc: 162 ng/mL (ref 12–200)
Myoglobin, poc: 176 ng/mL (ref 12–200)
Troponin i, poc: <0.05 ng/mL (ref 0.00–0.09)

## 2010-12-13 LAB — POCT I-STAT, CHEM 8
BUN: 26 mg/dL — ABNORMAL HIGH (ref 6–23)
Chloride: 108 mEq/L (ref 96–112)
Creatinine, Ser: 1.6 mg/dL — ABNORMAL HIGH (ref 0.4–1.5)
Sodium: 135 mEq/L (ref 135–145)
TCO2: 21 mmol/L (ref 0–100)

## 2010-12-13 LAB — CBC
HCT: 43.5 % (ref 39.0–52.0)
Hemoglobin: 14.8 g/dL (ref 13.0–17.0)
Platelets: 146 10*3/uL — ABNORMAL LOW (ref 150–400)
WBC: 7.1 10*3/uL (ref 4.0–10.5)

## 2010-12-13 LAB — DIFFERENTIAL
Eosinophils Relative: 2 % (ref 0–5)
Lymphocytes Relative: 33 % (ref 12–46)
Lymphs Abs: 2.3 10*3/uL (ref 0.7–4.0)
Neutro Abs: 4 10*3/uL (ref 1.7–7.7)

## 2010-12-14 LAB — GLUCOSE, CAPILLARY: Glucose-Capillary: 156 mg/dL — ABNORMAL HIGH (ref 70–99)

## 2010-12-27 ENCOUNTER — Ambulatory Visit (INDEPENDENT_AMBULATORY_CARE_PROVIDER_SITE_OTHER): Payer: PRIVATE HEALTH INSURANCE | Admitting: Internal Medicine

## 2010-12-27 ENCOUNTER — Encounter: Payer: Self-pay | Admitting: Internal Medicine

## 2010-12-27 VITALS — BP 132/64 | HR 68 | Temp 96.6°F | Wt 183.0 lb

## 2010-12-27 DIAGNOSIS — E875 Hyperkalemia: Secondary | ICD-10-CM

## 2010-12-27 DIAGNOSIS — Z860100 Personal history of colon polyps, unspecified: Secondary | ICD-10-CM

## 2010-12-27 DIAGNOSIS — B20 Human immunodeficiency virus [HIV] disease: Secondary | ICD-10-CM

## 2010-12-27 DIAGNOSIS — I2510 Atherosclerotic heart disease of native coronary artery without angina pectoris: Secondary | ICD-10-CM

## 2010-12-27 DIAGNOSIS — E119 Type 2 diabetes mellitus without complications: Secondary | ICD-10-CM

## 2010-12-27 LAB — GLUCOSE, CAPILLARY: Glucose-Capillary: 130 mg/dL — ABNORMAL HIGH (ref 70–99)

## 2010-12-27 MED ORDER — INSULIN GLARGINE 100 UNIT/ML ~~LOC~~ SOLN: 32.0000 [IU] | Freq: Every day | SUBCUTANEOUS | Status: DC

## 2010-12-27 MED ORDER — PANTOPRAZOLE SODIUM 40 MG PO TBEC: 40.0000 mg | DELAYED_RELEASE_TABLET | Freq: Every day | ORAL | Status: DC

## 2010-12-27 MED ORDER — CARVEDILOL 25 MG PO TABS: 25.0000 mg | ORAL_TABLET | Freq: Two times a day (BID) | ORAL | Status: DC

## 2010-12-27 MED ORDER — SIMVASTATIN 40 MG PO TABS: 40.0000 mg | ORAL_TABLET | Freq: Every day | ORAL | Status: DC

## 2010-12-27 MED ORDER — ASPIRIN-DIPYRIDAMOLE ER 25-200 MG PO CP12: 1.0000 | ORAL_CAPSULE | Freq: Two times a day (BID) | ORAL | Status: DC

## 2010-12-27 NOTE — Patient Instructions (Signed)
Diabetes and Exercise Regular exercise is important and can help:   Control blood glucose (sugar).   Decrease blood pressure.   Control blood lipids (cholesterol and triglycerides).   Improve overall health.  BENEFITS FROM EXERCISE:  Improved fitness.   Improved flexibility.   Improved endurance.   Increased bone density.   Weight control.   Increased muscle strength.   Decreased body fat.   Improvement of the body's use of a hormone called insulin.   Increased insulin sensitivity.   Reduction of insulin needs.   Helps you feel better.   Reduces stress and tension.  People with diabetes who add exercise to their lifestyle gain additional benefits.   Weight loss.   Reduces appetite.   Improves body's use of blood glucose (sugar).   Decreases risk factors for heart disease:   Lowering of cholesterol and triglycerides.   Raising the level of good cholesterol (high-density lipoproteins [HDL]).   Lowering blood sugar.   Decreases blood pressure.  TYPE 1 DIABETES AND EXERCISE  Exercise will usually lower your blood glucose.   If blood glucose is greater than 240 mg/dl, check urine ketones. If ketones are present, do not exercise.   Location of the insulin injection sites may need to be adjusted with exercise. Avoid injecting insulin into areas of the body that will be exercised. For example, avoid injecting insulin into:   The arms when playing tennis.   The legs when jogging. For more information, discuss this with your caregiver.   Keep a record of:   Food intake.   Type and amount of exercise.   Expected peak times of insulin action.   Blood glucose (sugar) levels.  Do this before, during and after exercise. Review your records with your caregiver(s). This will help you to develop guidelines for adjusting food intake and/or insulin amounts.  TYPE 2 DIABETES AND EXERCISE  Regular physical activity can help control blood glucose.   Exercise is  important because it may:   Increase the body's sensitivity to insulin.   Improve blood glucose control.   Exercise reduces the risk of heart disease. It decreases serum cholesterol and triglycerides. It also lowers blood pressure.   Those who take insulin or oral hypoglycemic agents should watch for signs of hypoglycemia. These signs include dizziness, shaking, sweating, chills and confusion.   Body water is lost during exercise. It must be replaced. This will help to avoid loss of body fluids (dehydration) and/or heat stroke.  Be sure to talk to your caregiver before starting an exercise program to make sure it is safe for you. Remember, any activity is better than none.  Document Released: 12/02/2003 Document Re-Released: 07/09/2009 [facility] Patient Information 2011 [place].

## 2010-12-27 NOTE — Progress Notes (Signed)
  Subjective:    Patient ID: [name], male    DOB: 08/21/1931, 77 y.o.   MRN: [id number]  HPI patient is a 77 year old man patient of Dr. Marinda Elk.  He comes in today for regular followup. This is the first time me seeing this patient. Patient blood pressure is well controlled 132/64 no new changes today.  Patient's HbA1c is 7.0 today which is up from 6.4 last time. Patient mentioned that he started bowling and that doesn't eat a lot of snacks while bowling which he does at least 2-3 times a week. Patient is compliant with his insulin.  Patient will see Dr. Lovena Le on April 15 his primary cardiologist. No complaint of chest pain or shortness of breath at this time.  Patient does not smoke and say that he quit in 1960s.  Foot exam was done today which was normal. Not able to assess patient's pulses because of the swelling present in his feet, patient says that this has been present in the past and it doesn't bother him.    Review of Systems  Constitutional: Negative for fever, activity change and appetite change.  HENT: Negative for sore throat.   Respiratory: Negative for cough and shortness of breath.   Cardiovascular: Positive for leg swelling. Negative for chest pain.  Gastrointestinal: Negative for nausea, abdominal pain, diarrhea, constipation and abdominal distention.  Genitourinary: Negative for frequency, hematuria and difficulty urinating.  Musculoskeletal: Positive for arthralgias.  Neurological: Negative for dizziness and headaches.  Psychiatric/Behavioral: Negative for suicidal ideas and behavioral problems.       Objective:   Physical Exam  Constitutional: He is oriented to person, place, and time. He appears well-developed and well-nourished.  HENT:  Head: Normocephalic and atraumatic.  Eyes: Conjunctivae and EOM are normal. Pupils are equal, round, and reactive to light. No scleral icterus.  Neck: Normal range of motion. Neck supple. No JVD present. No  thyromegaly present.  Cardiovascular: Normal rate, regular rhythm and normal heart sounds.  Exam reveals no gallop and no friction rub.   No murmur heard. Pulses:      Dorsalis pedis pulses are 0 on the right side, and 0 on the left side.       Posterior tibial pulses are 0 on the right side, and 0 on the left side.  Pulmonary/Chest: Effort normal and breath sounds normal. No respiratory distress. He has no wheezes. He has no rales.  Abdominal: Soft. Bowel sounds are normal. He exhibits no distension and no mass. There is no tenderness. There is no rebound and no guarding.  Musculoskeletal: Normal range of motion. He exhibits edema. He exhibits no tenderness.  Lymphadenopathy:    He has no cervical adenopathy.  Neurological: He is alert and oriented to person, place, and time.  Psychiatric: He has a normal mood and affect. His behavior is normal.          Assessment & Plan:

## 2010-12-27 NOTE — Assessment & Plan Note (Addendum)
Patient hba1c is 7.0 today. I will not change insulin dosage today. Patient is due for an eye exam I would send ophthalmology referral today.

## 2010-12-27 NOTE — Assessment & Plan Note (Signed)
Patient is due for a colonoscopy and I have sent him for a gastroenterology referral

## 2010-12-27 NOTE — Assessment & Plan Note (Signed)
Refill his coreg. Denies any chest pain or shortness of breath recently.

## 2010-12-27 NOTE — Assessment & Plan Note (Signed)
Will check CMET to assess potassium and creatinine.

## 2010-12-28 LAB — COMPLETE METABOLIC PANEL WITH GFR
ALT: 23 U/L (ref 0–53)
Albumin: 3.8 g/dL (ref 3.5–5.2)
CO2: 22 mEq/L (ref 19–32)
Calcium: 9.6 mg/dL (ref 8.4–10.5)
Chloride: 107 mEq/L (ref 96–112)
GFR, Est African American: 58 mL/min — ABNORMAL LOW (ref >60)
GFR, Est Non African American: 48 mL/min — ABNORMAL LOW (ref >60)
Glucose, Bld: 107 mg/dL — ABNORMAL HIGH (ref 70–99)
Potassium: 4.4 mEq/L (ref 3.5–5.3)
Sodium: 138 mEq/L (ref 135–145)
Total Protein: 6.3 g/dL (ref 6.0–8.3)

## 2011-01-03 LAB — POCT I-STAT, CHEM 8
BUN: 24 mg/dL — ABNORMAL HIGH (ref 6–23)
Creatinine, Ser: 1.6 mg/dL — ABNORMAL HIGH (ref 0.4–1.5)
Glucose, Bld: 192 mg/dL — ABNORMAL HIGH (ref 70–99)
Hemoglobin: 17 g/dL (ref 13.0–17.0)
Sodium: 136 mEq/L (ref 135–145)
TCO2: 23 mmol/L (ref 0–100)

## 2011-01-11 ENCOUNTER — Encounter: Payer: Self-pay | Admitting: Internal Medicine

## 2011-01-18 ENCOUNTER — Other Ambulatory Visit: Payer: Self-pay | Admitting: Internal Medicine

## 2011-01-18 NOTE — Telephone Encounter (Signed)
Just saw Dr Cathren Laine. Will refill.

## 2011-02-04 ENCOUNTER — Encounter: Payer: Self-pay | Admitting: Internal Medicine

## 2011-02-07 ENCOUNTER — Other Ambulatory Visit: Payer: Self-pay | Admitting: *Deleted

## 2011-02-07 ENCOUNTER — Encounter: Payer: Self-pay | Admitting: Internal Medicine

## 2011-02-07 ENCOUNTER — Ambulatory Visit (INDEPENDENT_AMBULATORY_CARE_PROVIDER_SITE_OTHER): Payer: PRIVATE HEALTH INSURANCE | Admitting: Internal Medicine

## 2011-02-07 DIAGNOSIS — I509 Heart failure, unspecified: Secondary | ICD-10-CM

## 2011-02-07 DIAGNOSIS — Z95810 Presence of automatic (implantable) cardiac defibrillator: Secondary | ICD-10-CM

## 2011-02-07 DIAGNOSIS — I428 Other cardiomyopathies: Secondary | ICD-10-CM

## 2011-02-07 DIAGNOSIS — I2589 Other forms of chronic ischemic heart disease: Secondary | ICD-10-CM

## 2011-02-07 MED ORDER — INSULIN PEN NEEDLE 31G X 6 MM MISC: Status: DC

## 2011-02-07 NOTE — Assessment & Plan Note (Signed)
He denies anginal symptoms. He will continue his current medications. 

## 2011-02-07 NOTE — Patient Instructions (Signed)
Your physician recommends that you schedule a follow-up appointment in: 3 months with device clinic

## 2011-02-07 NOTE — Assessment & Plan Note (Signed)
[facility] OFFICE NOTE   NAME:Makara, YOSSEF RETANA                     MRN:          [id number]  DATE:04/30/2007                            DOB:          01/03/1930    Mr. Friedli returns today for followup.  He is a very pleasant man with a  history of ischemic cardiomyopathy and congestive heart failure, status  post ICD insertion.  He returns today for followup.  He denies chest  pain.  He denies shortness of breath.  He continues to walk 3 miles a  day.  He denies pedal edema.   PHYSICAL EXAMINATION:  He is a pleasant, well-appearing man in no  distress.  The blood pressure today was 102/60, the pulse 76 and regular, the  respirations were 18 and the weight was 168 pounds.  NECK:  Revealed no jugulovenous distention.  LUNGS:  Clear bilaterally to auscultation; no wheezes, rales or rhonchi  were present.  CARDIOVASCULAR:  Exam revealed a regular rate and rhythm with normal S1  an S2.  EXTREMITIES:  Demonstrated trace peripheral edema bilaterally.   MEDICATIONS:  1. Protonix.  2. Coreg 25 mg twice daily.  3. Aggrenox.  4. Glipizide.  5. Simvastatin 80 mg a day.  6. Enalapril 5 mg twice a day.   INTERROGATION OF DEFIBRILLATOR:  Demonstrates a Medtronic Onyx with R  waves of 10.  The impedance was 481 ohms and threshold voltage of 0.2,  the battery voltage of 2.68 volts.  The underlying rhythm was sinus.   IMPRESSION:  1. Ischemic cardiomyopathy.  2. Congestive heart failure.  3. Status post implantable cardioverter-defibrillator insertion.   DISCUSSION:  Overall, Mr. Leibfried is stable.  He continues to walk  regularly and overall is feeling well.  We will plan to see him back in  the office in 1 year for followup.  He will check in with [facility].     Champ Mungo. Lovena Le, MD  Electronically Signed    GWT/MedQ  DD: 04/30/2007  DT: 05/01/2007  Job #: [id number]

## 2011-02-07 NOTE — H&P (Signed)
[name]   NAME:[name], [name]                     MRN:          [id number]  DATE:05/06/2008                            DOB:          01/03/1930    PRIMARY CARDIOLOGIST:  Dr. Dorris Carnes.   EP:  Dr. Lovena Le.   CHIEF COMPLAINT:  Chest and arm numbness.   HISTORY OF PRESENT ILLNESS:  This is a 77 year old African American male  patient who walked into our clinic today because he awakened with  tingling down his arm and was worried that he was having trouble with  his defibrillator.  He says he awakened at 2 a.m. with numbness in his  chest radiating down his arm and up his left neck.  He has not been able  to sleep all night and it is just easing off now in the office.  [name]  was able to check his defibrillator, which is working fine.  EKG shows  sinus rhythm with first-degree AV block, right bundle branch block, LVH,  and old anterior infarct.  The patient says this pain is similar to when  he had his stents in the past but not as severe.  He says back then it  was a 10/10 and currently it is a 1/10 but is similar in quality.  He  denies any exertional tightness, pressure, dizziness or presyncope.   ALLERGIES:  No known drug allergies.   CURRENT MEDICATIONS:  1. Protonix 40 mg daily.  2. Coreg 25 mg b.i.d.  3. Aggrenox 200/25 b.i.d.  4. Glipizide 10 mg 2 in the morning 1 in the evening.  5. Simvastatin 80 mg daily.  6. Enalapril 5 mg b.i.d.   PAST MEDICAL HISTORY:  Significant for diabetes mellitus, history of TIA  on Aggrenox, dyslipidemia, implantable cardioverter-defibrillator for  ischemic cardiomyopathy, history of acute renal failure in 2006 was felt  secondary to dehydration and hypokalemia creatinine had been 2.6 but  corrected to 1.3 prior to discharge, coronary artery disease status post  stenting of the left circumflex requiring re-stent in May 2005.  He has  chronic occlusion of  the LAD with collateral filling.  He has moderate  RCA disease, EF 30-40%.  Defibrillator placed in 2005.  History of  sigmoid polyps.  History of mild antral gastritis   SOCIAL HISTORY:  Lives alone.  He is a nonsmoker.   FAMILY HISTORY:  See old records.   REVIEW OF SYSTEMS:  Negative for dizziness or presyncopal signs or  symptoms, dyspepsia, dysphagia, nausea, vomiting, change in bowels or  melena.  CARDIOPULMONARY:  See HPI.   PHYSICAL EXAM:  CONSTITUTIONAL:  This is a pleasant 77 year old African  American male complaining of numbness in his arm.  VITAL SIGNS:  Blood pressure 103/56, pulse 67, weight 173.  HEENT:  Head is normocephalic without sign of trauma.  Extraocular  movements are intact.  Pupils are equal, round, and reactive to light  and accommodation.  Oral mucosa moist.  Throat is without erythema or  exudate.  NECK:  Without JVD, HJR.  LUNGS:  Decreased breath  sounds but clear anterior, posterior, lateral.  HEART:  Regular rate and rhythm at 70 beats per minute.  Normal S1, S2  no murmur, rub, bruit, thrill or heave noted.  ABDOMEN:  Soft without organomegaly, masses, lesions or abnormal  tenderness.  EXTREMITIES:  Without cyanosis, clubbing, or edema.  Good distal pulses.  NEUROLOGIC:  Without focal deficit.   IMPRESSION:  1. Left chest arm and neck numbness rule out ischemia equivalent.  2. Coronary artery disease status post stenting of the left circumflex      with restent in May 2005.  Chronic occlusion of the total LAD with      collateral filling.  Moderate RCA disease, EF 30-40%.  3. Ischemic cardiomyopathy, EF 30-40%.  4. Implantable cardiac defibrillator placed in August 2005.  5. 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  6. Acute renal failure in July 2006 felt secondary to dehydration and      hypokalemia, resolved.  7. History of syncope in 2006 felt secondary to dehydration.  8. Diabetes  mellitus.  9. Hypertension.  10.Hyperlipidemia.   PLAN:  We are going to admit this patient to the hospital to rule out MI  and most likely cath to rule out progression of his coronary artery  disease.  We will wait and make sure his renal function is stable prior  to cardiac catheterization.      Ermalinda Barrios, PA-C  Electronically Signed      Carlena Bjornstad, MD, [facility]  Electronically Signed   ML/MedQ  DD: 05/06/2008  DT: 05/06/2008  Job #: [id number]

## 2011-02-07 NOTE — Assessment & Plan Note (Signed)
[facility] OFFICE NOTE   NAME:[name], [name]                     MRN:          [id number]  DATE:05/15/2008                            DOB:          01/03/1930    Mr. Locurto returns today for followup.  He is a very pleasant elderly  male with an ischemic cardiomyopathy, congestive heart failure who was  recently hospitalized with unstable angina and underwent stenting to the  circumflex coronary artery.  He returns today for followup.  He had no  specific complaints today.  He denied chest pain.  He denied shortness  of breath.  He denies peripheral edema.  He states that he feels much  better than he did previously.   PHYSICAL EXAMINATION:  GENERAL:  On physical exam, he is a pleasant  elderly man in no distress.  VITAL SIGNS:  Blood pressure was 103/61 and the pulse 64 and regular,  respirations were 18, weight was 165 pounds.  NECK:  No jugular venous distention.  LUNGS:  Clear bilaterally to auscultation.  No wheezes, rales, or  rhonchi are present.  CARDIOVASCULAR:  Regular rate and rhythm with normal S1 and a split S2.  ABDOMEN:  Soft and nontender.  EXTREMITIES:  Demonstrate no edema.   Medicine includes,  1. Protonix 40 a day.  2. Coreg 25 twice a day.  3. Glipizide 2 tablets twice daily.  4. Simvastatin 80 a day.  5. Enalapril 5 twice daily.  6. Plavix 75 a day.   IMPRESSION:  1. Ischemic cardiomyopathy.  2. Congestive heart failure.  3. Status post implantable cardioverter-defibrillator insertion.  4. Diabetes.  5. Chronic renal insufficiency.   DISCUSSION:  Overall, Mr. Fanta is stable.  His defibrillator  previously was working normally.  I will see him back for ICD check in 1  year.     Champ Mungo. Lovena Le, MD  Electronically Signed    GWT/MedQ  DD: 05/15/2008  DT: 05/16/2008  Job #: [id number]

## 2011-02-07 NOTE — Progress Notes (Signed)
HPI [name] returns today for followup. He is a 77 year old man with an ischemic cardiomyopathy, congestive heart failure, and nonsustained ventricular tachycardia. He is status post ICD insertion secondary to all of the above. Despite his multiple medical problems, he remains active. He walks on a regular basis and has been bowling in several leagues.he denies any ICD shocks. His heart failure symptoms are class II. No Known Allergies   Current Outpatient Prescriptions  Medication Sig Dispense Refill  . aspirin 81 MG EC tablet Take 81 mg by mouth daily.        . Bepotastine Besilate (BEPREVE) 1.5 % SOLN Apply to eye.        . carvedilol (COREG) 25 MG tablet Take 1 tablet (25 mg total) by mouth 2 (two) times daily with a meal.  60 tablet  12  . dipyridamole-aspirin (AGGRENOX) 25-200 MG per 12 hr capsule Take 1 capsule by mouth 2 (two) times daily.  60 capsule  12  . LANTUS SOLOSTAR 100 UNIT/ML injection inject 32 units sq once daily at bedtime  15 mL  3  . pantoprazole (PROTONIX) 40 MG tablet Take 1 tablet (40 mg total) by mouth daily.  31 tablet  12  . simvastatin (ZOCOR) 40 MG tablet Take 1 tablet (40 mg total) by mouth daily.  31 tablet  12     Past Medical History  Diagnosis Date  . Anemia   . Cataracts, bilateral   . Hx of colonic polyps   . CHF (congestive heart failure)     ischemic CM EF 15-20% s/p AICD 05/24/04  . CAD (coronary artery disease)     s/p AMI, s/p PTCA & stent of cx 12/04 restent 5/05  . Diabetes mellitus   . Diverticulosis of colon   . Hyperlipidemia   . PVD (peripheral vascular disease)     s/p L carotid PTCA/stent 2004  . Transient ischemic attack   . Erectile dysfunction   . Hyperkalemia 08/2008    K=5.7     ROS:   All systems reviewed and negative except as noted in the HPI.   No past surgical history on file.   Family History  Problem Relation Age of Onset  . Diabetes Mother   . Diabetes Brother      History   Social History  .  Marital Status: Widowed    Spouse Name: N/A    Number of Children: N/A  . Years of Education: N/A   Occupational History  . Not on file.   Social History Main Topics  . Smoking status: Former Smoker    Types: Cigarettes    Quit date: 12/27/1967  . Smokeless tobacco: Not on file  . Alcohol Use: Not on file  . Drug Use: Not on file  . Sexually Active: Not on file   Other Topics Concern  . Not on file   Social History Narrative  . No narrative on file     BP 122/65  Pulse 58  Ht 5\' 11"  (1.803 m)  Wt 182 lb (82.555 kg)  BMI 25.38 kg/m2  Physical Exam:  Well appearing NAD HEENT: Unremarkable Neck:  No JVD, no thyromegally Lymphatics:  No adenopathy Back:  No CVA tenderness Lungs:  Clear HEART:  Regular rate rhythm, no murmurs, no rubs, no clicks Abd:  Flat, positive bowel sounds, no organomegally, no rebound, no guarding Ext:  2 plus pulses, no edema, no cyanosis, no clubbing Skin:  No rashes no nodules Neuro:  CN II through  XII intact, motor grossly intact  DEVICE  Normal device function.  See PaceArt for details.   Assess/Plan:

## 2011-02-07 NOTE — Discharge Summary (Signed)
NAME:  [name], [name]              ACCOUNT NO.:  [id number]   MEDICAL RECORD NO.:  [id number]          PATIENT TYPE:  INP   LOCATION:  6522                         FACILITY:  [facility]   PHYSICIAN:  Lauree Chandler, MDDATE OF BIRTH:  01/03/1930   DATE OF ADMISSION:  05/06/2008  DATE OF DISCHARGE:  05/08/2008                         DISCHARGE SUMMARY - REFERRING   DISCHARGE DIAGNOSES:  1. Acute coronary syndrome.  2. Progressive coronary artery disease.  3. Status post cutting balloon to the circumflex by Dr. Angelena Form on      May 07, 2008.  4. Hyperglycemia, with a history of diabetes.  5. Hyperkalemia, resolved.  6. Renal insufficiency on admission, improved at the time of      discharge.  7. History as listed previously.   PROCEDURES PERFORMED:  Left cardiac catheterization with cutting balloon  angioplasty on the circumflex by Dr. Burt Knack on May 07, 2008.   At the time of this dictation, it is not clear who the patient's primary  care physician is.   HISTORY:  [name] is a 77 year old African American male who walked  into the cardiac clinic today because he awakened with tingling down his  arm and worried that he was having trouble with his defibrillator.  He  also noted chest discomfort and has been unable to sleep.  EKG showed  right bundle branch block and first-degree AV block.  He also stated the  discomfort was similar to when he had his stents in the past, but not as  severe, thus his admission.   PAST MEDICAL HISTORY:  1. Diabetes.  2. TIAs.  3. Dyslipidemia.  4. ICD secondary to ischemic cardiomyopathy.  5. Acute renal failure in 2006 secondary to dehydration.  6. Coronary artery disease with stenting of the circumflex and re-      stent in May 2005.  chronic LAD occlusion with collateral filling.      EF 30% to 40%.  Defibrillator in 2005.  7. Gastritis.   LABORATORY DATA:  Admission weight was 75.9.  Hemoglobin and hematocrit  13.9 and 42.5,  normal indices, platelets 140, WBCs 6.9.  PTT 34, PT  14.6.  Sodium 135, potassium 5.7, BUN 32, creatinine 1.54, glucose 155.  Normal LFTs.  CK-MBs, relative indexes, and troponins were within normal  limits times three.  TSH was 0.992.  Prior to discharge, sodium was 134,  potassium 4.9, BUN 19, creatinine 1.39, glucose 213.  Hemoglobin and  hematocrit were 13.0 and 39.1, normal indices, platelets 154, WBCs 7.2.  EKG showed normal sinus rhythm, left axis deviation, first-degree AV  block, sinus bradycardia, right bundle branch block, no acute changes.   HOSPITAL COURSE:  The patient was admitted to the hospital by Estella Husk and Dr. Ron Parker from the office.  A chest x-ray was not performed  during this admission.  He ruled out for myocardial infarction.  He was  placed on his home medications.  Catheterization was performed on May 07, 2008, that showed moderate circumflex in-stent re-stenosis that was  treated with cutting balloon angioplasty.  Again, chronic LAD occlusion  with collateral filling and  moderate RCA stenosis that was stable from  prior studies.  He was recommended aspirin and Plavix at least for 30  days.  Post-procedure on May 08, 2008, the patient was ambulating  without difficulty.  Dr. Angelena Form felt that the patient could be  discharged home.   DISPOSITION:  The patient is discharged home.  Asked to maintain a low-  sodium, heart-healthy, ADA diet.  Wound care and activities are per  supplemental discharge sheet.   DISCHARGE MEDICATIONS:  New medications include:  1. Aspirin 325 mg daily.  2. Plavix 75 mg daily.  3. Nitroglycerin 0.4 as needed.   He was advised to continue:  1. Coreg 25 mg b.i.d.  2. Enalapril 5 mg b.i.d.  3. Simvastatin 80 mg at bedtime.  4. Glipizide as previously.  5. Protonix as previously.   He was asked to bring all medications to all appointments.  At this  time, we will discontinue Aggrenox.   1. At the time of followup  with Dr. Harrington Challenger on May 28, 2008, at 4      p.m., consideration should be given to continuing Plavix and      aspirin post 30 days or if these are continued, his Aggrenox will      need to be resumed, given his history of TIAs.  2. Consideration of chest x-ray in the office should also be      performed, as one was not performed during this admission.  Follow      up on lipid status and diabetes status should also be performed,      given that it is unknown when his last lipid check was and that his      sugars have been running high throughout the hospital.  It is      unknown at the time of this dictation who his primary care      physician is.  He will also follow up with Dr. Lovena Le on May 15, 2008, at 10:30 for EP.   DISCHARGE TIME:  40 minutes.      Sharyl Nimrod, PA-C      Lauree Chandler, MD  Electronically Signed    EW/MEDQ  D:  05/08/2008  T:  05/08/2008  Job:  [id number]   cc:   Fay Records, MD, [facility]  Champ Mungo. Lovena Le, MD

## 2011-02-07 NOTE — Assessment & Plan Note (Signed)
His device is working normally. We'll recheck in several months.

## 2011-02-10 NOTE — Discharge Summary (Signed)
NAME:  Robert, [name]                        ACCOUNT NO.:  [id number]   MEDICAL RECORD NO.:  [id number]                   PATIENT TYPE:  INP   LOCATION:  6532                                 FACILITY:  [facility]   PHYSICIAN:  Champ Mungo. Lovena Le, M.D.               DATE OF BIRTH:  01/03/1930   DATE OF ADMISSION:  05/24/2004  DATE OF DISCHARGE:  05/25/2004                                 DISCHARGE SUMMARY   DISCHARGE DIAGNOSES:  1.  Elective admission for implantation of Medtronic implantable      cardioverter-defibrillator August 30.  2.  History of coronary artery disease with in-stent restenosis of the left      circumflex on catheterization Jan 28, 2004, with angioplasty.  Known 100%      occlusion of the proximal left anterior descending fed by right-to-left      collaterals.  3.  Ischemic cardiomyopathy with ejection fraction 20% found at      catheterization May 2005.  4.  Class II congestive heart failure symptoms.   SECONDARY DIAGNOSES:  1.  Type 2 diabetes.  2.  Dyslipidemia.  3.  Hypertension.  4.  History of extracranial cerebrovascular occlusive disease, status post      right carotid stent.  5.  Right bundle branch block.   PROCEDURES:  May 24, 2004, implantation of ICD by way of the left  subclavian vein for ejection fraction of 25%, Class II CHF, nonsustained  ventricular tachycardia.  Defibrillation testing less than 12 joules.  Acceptable V pacing and sensing.  The ICD is a Medtronic YX ER [id number] Cx,  Serial Number [id number].   DISPOSITION:  [name] was ready for discharge postprocedure day  #1.  Chest x-ray showed appropriate lead placement in the right ventricle  with leads intact.  He has been afebrile, achieving 95% oxygen saturation on  room air.  The incision site is healing nicely.  Goes home on the following  medications.   DISCHARGE MEDICATIONS:  1.  Coreg 25 mg twice daily.  2.  Aspirin 325 mg daily.  3.  Plavix 75 mg daily.  4.  Zocor 40  mg daily at bedtime.  5.  Altace 10 mg twice daily.  6.  Lasix 20 mg daily.  7.  Protonix 40 mg daily.  8.  Glipizide 10 mg twice daily.  9.  Iron 325 mg 3 times daily.  10. For pain at the incision site, Tylenol 325 mg 1 or 2 tablets every 4 to      6 hours as needed.   OTHER DISCHARGE INSTRUCTIONS:  1.  Activity for mobilization after theis procedure have been described to      the patient.  2.  Discharge diet is low-sodium, low-cholesterol, ADA diet.  3.  The patient is to keep the incision open to the air.  4.  Sponge bathe until Tuesday, September 6, when he can start showering.  5.  He will have a followup in the pacer clinic on Thursday, September 22,      at 9:15 in the morning.  6.  He will see Dr. Lovena Le December 21 at 9:30 in the morning.   BRIEF HISTORY:  The patient is a 77 year old African-American male with  history of diabetes, dyslipidemia, hypertension and coronary artery disease  with ischemic cardiomyopathy.  Ejection fraction had been 10 to 15% in the  past.  He has a history of carotid stenting back in December 2004.  The  patient has done well since a percutaneous intervention in May of this year.  He has Class I to II heart failure symptoms.  He states he walks two miles a  day, exercises regularly, denies peripheral edema.  A 2-D echocardiogram  will be planned to see if there is any incremental improvement in left  ventricular function; however, if his ejection fraction remains less than  30%, we will plan to proceed with empiric implantable cardioverted  defibrillator scheduled at the earliest possible time.   HOSPITAL COURSE:  The patient was admitted on elective basis August 30.  Medtronic ICD was placed without complications, and patient discharged with  the medications and followup as dictated above.      [name], P.A.                    Champ Mungo. Lovena Le, M.D.    GM/MEDQ  D:  05/25/2004  T:  05/26/2004  Job:  [id number]   cc:   [name]  Teaching Service

## 2011-02-10 NOTE — Consult Note (Signed)
NAME:  [name], [name]                        ACCOUNT NO.:  [id number]   MEDICAL RECORD NO.:  [id number]                   PATIENT TYPE:  INP   LOCATION:  [id number]                                 FACILITY:  [facility]   PHYSICIAN:  Christy Sartorius, M.D.                   DATE OF BIRTH:  01/03/1930   DATE OF CONSULTATION:  09/09/2003  DATE OF DISCHARGE:                                   CONSULTATION   REPORT TITLE:  CARDIOLOGY CONSULTATION   CONSULTING PHYSICIAN:  Christy Sartorius, M.D.   REFERRING PHYSICIAN:   CHIEF COMPLAINT:  Right-sided numbness, weakness.   HISTORY:  [name] is a 77 year old black male with known history of  coronary artery disease who presents for assessment of right body numbness.  The patient presented on December 12 with these symptoms.  He has known left  internal carotid disease and had followup appointment with his surgeon and  Cardiology to assess this.  Unfortunately, the patient presented to the  hospital with acute stroke.  He was heparinized and had gradual resolution  of symptoms over the ensuing hours to the point that he was now without  neurologic deficits.   The patient has history of severe LV dysfunction on prior catheterization  with occlusion of the LAD and diagonal branch.  He underwent percutaneous  intervention as he was considered to be unlikely to benefit from surgical  revascularization.  He is referred for assessment for carotid stent  placement.  Currently, the patient denies any chest pain or shortness of  breath.  He has not had any lower extremity edema, orthopnea, paroxysmal  nocturnal dyspnea.  He has not had any focal neurologic deficits for the  past 48 hours.   PAST MEDICAL HISTORY:  1. Coronary artery disease status post myocardial infarction 1990.  He had a     catheterization July 09, 2003 showing occlusion of the LAD system,     high grade narrowing of left circumflex artery.  He subsequently     underwent staged intervention  on July 14, 2003 after he was considered     to be a poor surgical candidate.  2. History of diabetes mellitus.  3. Dyslipidemia.  4. Degenerative joint disease.  5. Chronic right bundle branch block.  6. Congestive heart failure.   ALLERGIES:  None.   MEDICATIONS:  1. Zocor 20 mg daily.  2. Altace 2.5 mg daily.  3. Lasix 20 mg daily.  4. Aspirin 325 daily.  5. Glipizide 10 mg daily.  6. Glucophage 1000 mg b.i.d.  7. Protonix 40 mg daily.  8. In the hospital he has been started on Plavix as well as a heparin drip.   SOCIAL HISTORY:  The patient lives in [place] with his girlfriend.  He  has a history of tobacco use.  Quit in 1970s.  Also had heavy alcohol use as  well at that time.  He  is retired from [employer].  Is a former Company secretary.   FAMILY HISTORY:  Mother died at the age of 73 with diabetes mellitus and  coronary disease.  Father died age 10 with coronary disease.  One brother  with coronary disease.   PHYSICAL EXAMINATION:  GENERAL:  He is a thin black male in no acute  distress.  VITAL SIGNS:  Temperature 98.0, pulse 92, respirations 20, blood pressure  127/75, O2 saturation 100% on room air.  HEENT:  Pupils are equal, round, and reactive to light.  Extraocular muscles  are intact.  Oropharynx shows no lesions, but poor dentition.  NECK:  Supple.  Left bruit is audible.  No adenopathy.  HEART:  Regular rate, soft systolic ejection murmur.  LUNGS:  Relatively clear to auscultation.  ABDOMEN:  Soft, nontender.  EXTREMITIES:  No edema.  NEUROLOGIC:  Cranial nerves II-XII are intact.  Motor strength and sensory  is grossly intact distally.   LABORATORY DATA:  Chest x-ray shows cardiomegaly with mild left lower lobe  atelectasis.  Head CT on September 06, 2003 shows no acute disease.  There is  moderate atrophy.  Calcification is noted bilaterally in the carotid sites.  MRA of the head on July 13, 2003 shows critical stenosis of the proximal  left internal  carotid artery with occluded left vertebral artery and mild  disease noted on the right.  Carotid ultrasound performed on September 06, 2003 shows ICA velocity 519 cm per second and, of course, finding severe  disease of greater than 80% with turbulent flow distal to the stenosis.   ECG shows sinus rhythm at 60-70 bpm.  There is first degree AV block, right  bundle branch block, and left anterior fascicular block.  The anterolateral  myocardial infarction is noted.   White count 8.1, hemoglobin 13.6, hematocrit 41.2, platelets 325.  Sodium  138, potassium 3.9, chloride 104, bicarbonate 27, BUN 5, creatinine 1.2,  glucose 118.  Cardiac enzymes were negative.   ASSESSMENT/PLAN:  [name] is a 77 year old black gentleman who presents  with right-sided numbness consistent with left brain stroke.  The patient  has high grade stenosis of the left internal carotid artery and is at  increased risk for recurrent cerebrovascular events.  Unfortunately, his  severe LV dysfunction and underlying coronary disease increase the risk for  cardiovascular event at the time of carotid endarterectomy.  Agree that the  patient would benefit from percutaneous stent placement to the internal  carotid artery with a similar risk of cerebrovascular events, lower risk of  cardiovascular events, the risks, benefits, and alternatives of which have  been discussed with the patient.  He understands and wishes to proceed.  The  patient would also benefit from high dose statin therapy given his  underlying coronary and cerebrovascular disease and advancement of his ACE  inhibitor to as high a dose as tolerated.  He has already been started on  Plavix and this will be continued in the periprocedural period and  consideration should be given towards electrophysiologic consultation when  this patient has recovered from his current event for assessment of severe left ventricular dysfunction as he may benefit from  implantable cardioverter  defibrillator placement.                                               Christy Sartorius, M.D.  NG/MEDQ  D:  09/09/2003  T:  09/09/2003  Job:  [id number]   cc:   Jay Schlichter, M.D.  [address] [place] [address]  Fax: [phone]   Bryson Dames, M.D.  [phone], [address] [place] [address]  Fax: [phone]   Larey Dresser, M.D.  [facility]  [place], [place] [address]  Fax: [phone]

## 2011-02-10 NOTE — H&P (Signed)
NAMEKENN, SCHNOOR              ACCOUNT NO.:  [id number]   MEDICAL RECORD NO.:  [id number]          PATIENT TYPE:  INP   LOCATION:  6532                         FACILITY:  [facility]   PHYSICIAN:  Champ Mungo. Lovena Le, M.D.  DATE OF BIRTH:  01/03/1930   DATE OF ADMISSION:  05/24/2004  DATE OF DISCHARGE:                                HISTORY & PHYSICAL   CHIEF COMPLAINT:  I am here for Dr. Lovena Le to work on me.   HISTORY OF PRESENT ILLNESS:  Mr. Hammann is a 77 year old, African-American  male with a history of ischemic cardiomyopathy with recent ejection fraction  by 2D echocardiogram of 25%.  He has a history of ischemic coronary artery  disease and is status post interventions in the left circumflex coronary  artery.  He also has a history of diabetes, dyslipidemia and hypertension.  He has extracranial cerebrovascular occlusive disease and he is status post  carotid stenting in December 2004.  His congestive heart failure symptoms  range from I-II.  At this point, he is a candidate for cardioverter  defibrillator implantation according to MADIT II criteria.   ALLERGIES:  No known drug allergies.   MEDICATIONS:  1.  Coreg 25 mg twice daily.  2.  Aspirin 325 mg daily.  3.  Plavix 75 mg daily.  4.  Zocor 40 mg daily at bedtime.  5.  Altace 10 mg twice daily.  6.  Lasix 20 mg daily.  7.  Protonix 40 mg daily.  8.  Glipizide 10 mg twice daily.  9.  Iron 325 mg three times daily.   PAST MEDICAL HISTORY:  1.  Ischemic coronary artery disease with finding of in-stent restenosis in      the left circumflex on a left heart catheterization on Jan 28, 2004,      status post angioplasty.  The patient has known 100% occlusion of the      proximal left anterior descending which this territory is fed by left to      right collaterals.  2.  Ischemic cardiomyopathy with ejection fraction 20% found on      catheterization in May 2005.  3.  Congestive heart failure symptoms Class II.  4.  Type 2  diabetes.  5.  Lymphedema.  6.  Hypertension.  7.  History of extracranial cerebrovascular occlusive disease, status post      right carotid stent.  8.  Right bundle branch block.   REVIEW OF SYSTEMS:  CONSTITUTIONAL:  The patient is not having any fevers,  chills, night sweats, unexplained weight gain or loss.  HEENT:  No  photophobia, no epistaxis and no vertigo.  INTEGUMENTARY:  No lesions,  ulcerations or rashes.  CARDIOPULMONARY:  The patient is not experiencing  current or recent chest pain, shortness of breath, orthopnea, paroxysmal  nocturnal dyspnea, edema, presyncope or syncope, claudication.  GENITOURINARY:  The patient does have nocturia two to three times a night.  NEUROLOGIC:  No focal neurologic deficits outlined by the patient.  No  history of stroke or seizure.  ENDOCRINE:  The patient has long-standing  diabetes.  No  evidence of thyroid disease and no other endocrine  abnormality.   PHYSICAL EXAMINATION:  VITAL SIGNS:  Temperature 98.2, pulse 66 and regular,  respirations 18, blood pressure elevated at 165/78, height 5 feet 11 inches,  weight 169 pounds.  HEENT:  Normocephalic, atraumatic.  Pupils equal round and reactive to  light.  Extraocular movements intact.  Sclerae are clear.  No nasal  discharge.  NECK:  Supple.  No carotid bruits auscultated.  No jugular venous  distention.  LUNGS:  Clear to auscultation and percussion bilaterally.  HEART:  Regular rate and rhythm without murmur.  ABDOMEN:  Soft, nondistended, bowel sounds present.  The abdominal aorta is  nonpulsatile.  EXTREMITIES:  Radial pulses are 4/4 bilaterally.  Dorsalis pedis pulses  somewhat muted at 3/4 bilaterally.  NEUROLOGIC:  No neurologic deficits noted.   IMPRESSION:  The patient meets criteria for implantation of implantable  cardioverter-defibrillator.  This will be carried out today, August 30, by  Dr. Cristopher Peru.  For other assessments, please see past medical history.        GM/MEDQ  D:  06/23/2004  T:  06/23/2004  Job:  [id number]

## 2011-02-10 NOTE — Op Note (Signed)
NAME:  [name], [name]              ACCOUNT NO.:  [id number]   MEDICAL RECORD NO.:  [id number]          PATIENT TYPE:  AMB   LOCATION:  ENDO                         FACILITY:  [facility]   PHYSICIAN:  John C. Amedeo Plenty, M.D.    DATE OF BIRTH:  01/03/1930   DATE OF PROCEDURE:  09/05/2004  DATE OF DISCHARGE:                                 OPERATIVE REPORT   PROCEDURE:  Colonoscopy with polypectomy.   INDICATIONS FOR PROCEDURE:  Anemia and heme-positive stools.   DESCRIPTION OF PROCEDURE:  The patient was placed in the left lateral  decubitus position and placed on the pulse monitor with continuous low-flow  oxygen delivered by nasal cannula.  He was sedated with 10 mcg IV fentanyl  and 1 mg IV Versed in addition medicine given for the previously EGD.  The  Olympus video colonoscope was inserted into the rectum and advanced to the  cecum, confirmed by transillumination of McBurney's point and visualization  of the ileocecal valve and appendiceal orifice.  Prep was good.  The cecum  appeared normal with no masses,  polyps, diverticula, or other mucosal  abnormalities.  The ileocecal valve had a vague, approximately 1 cm, sessile  polyp that was removed by snare.  The remainder of the ileocecal valve as  well as the ascending and transverse colon appeared normal.  In the  descending sigmoid colon, there were numerous diverticula.  At about 30 cm  in the sigmoid colon, there was a large, approximately 2 cm pedunculated  polyp on thick stalk.  This was removed by snare.  The remainder of the  sigmoid and rectum appeared normal other than sigmoid diverticula.  The  scope was then withdrawn and the patient returned to the recovery room in  stable condition.  He tolerated the procedure well and there were no  immediate complications.   IMPRESSION:  1.  Ascending and sigmoid colon polyps.  2.  Diverticulosis.   PLAN:  Await histology.      JCH/MEDQ  D:  09/05/2004  T:  09/05/2004  Job:   [id number]   cc:   Jay Schlichter, M.D.  [address], [place] [address]  Fax: [phone]

## 2011-02-10 NOTE — Discharge Summary (Signed)
NAME:  [name], [name]                        ACCOUNT NO.:  [id number]   MEDICAL RECORD NO.:  [id number]                   PATIENT TYPE:  INP   LOCATION:  6529                                 FACILITY:  [facility]   PHYSICIAN:  Edythe Lynn, M.D.                    DATE OF BIRTH:  01/03/1930   DATE OF ADMISSION:  01/26/2004  DATE OF DISCHARGE:  01/29/2004                                 DISCHARGE SUMMARY   DISCHARGE DIAGNOSES:  1. Unstable angina, status post 95% in-stent occlusion with percutaneous     coronary intervention and new stent placement to the left circumflex     artery.  2. Severe ischemic cardiomyopathy, ejection fraction around 20%.  3. First degree atrial ventricular block with a right bundle branch block.  4. Sinus nodal dysfunction secondary to Digoxin and carvedilol, resolved     after holding the Digoxin and the Coreg.  5. Diabetes mellitus, type 2.  6. Hyperlipidemia.  7. Anemia, multifactorial iron deficiency and vitamin B-12 deficiency with     trace occult blood positive.  8. Osteoarthritis of the right hip.  9. Gastroesophageal reflux disease.  10.      Mild renal insufficiency.  11.      Carotid artery stenosis, status post left internal carotid artery     stent placement in December 2004.   DISCHARGE MEDICATIONS:  1. Enteric coated aspirin 325 mg by mouth daily.  2. Plavix 75 mg by mouth daily for a total of 12 months.  3. Zocor 40 mg p.o. q.h.s.  4. Coreg 6.25 mg p.o. b.i.d.  5. Altace 10 mg p.o. b.i.d.  6. Lasix 20 mg p.o. every day.  7. Glucotrol 10 mg p.o. b.i.d.  8. Protonix 40 mg p.o. every day.  9. Iron sulfate 325 mg p.o. t.i.d.  10.      Nitroglycerin 0.4 mg sublingual p.r.n.   CONDITION ON DISCHARGE:  The patient was discharged in good condition.  At  the time of discharge he was ambulating without symptoms inside the  hospital.  He was pain free, after the catheterization.  He did not have any  bleeding from the site of catheterization.   He was instructed to followup as follows:  1. [facility] on Feb 09, 2004 at 11 a.m. to check the     catheterization site.  2. Thursday, Feb 04, 2004 with Dr. Percival Spanish in the [facility] Heart     Failure Clinic.  3. Wednesday, Feb 10, 2004 with Dr. Champ Mungo. Lovena Le for planning the     pacemaker placement.  [facility] with Dr. Jay Schlichter on February 25, 2004 at     9 in the morning.   [name] was instructed to report immediately to the emergency room if he  has any more recurrence of the chest pain.  [name] at the followup visit  with Dr. [name], he will have to have a repeat hemoglobin and after  the implantable cardiac defibrillator will be placed by Dr. Lovena Le, he will  have to undergo another colonoscopy.   During this admission, the patient underwent the following procedures:  On Jan 28, 2004, the patient underwent a cardiac catheterization, by Dr. Wyonia Hough. Pulsipher, that showed right coronary artery 70% stenosis, a left  anterior descending artery with 100% stenosis but reconstitutes through  collaterals, left circumflex stent with a 95% stenosis.  Dr. Vicenta Aly did  percutaneous intervention and placed a second stent on the site of the  previous stent with a TIMI-3 flow positive post procedure.   Consultations during this admission:  1. We have consulted Dr. Lovena Le from [facility].  2. We consulted Dr. Eden Lathe. Einar Gip, from [facility], came by and     decided to transfer totally the cardiologic care of [name] to the     [facility] who is going to follow this patient from now on.   BRIEF ADMITTING HISTORY AND PHYSICAL:  [name] is a 77 year old African  American man with a past medical history of coronary artery disease,  congestive heart failure, carotid artery stenosis status post stenting of  the carotid artery and the left circumflex artery, diabetes mellitus, type  2,  dyslipidemia, who presented to [facility], on Jan 26, 2004,  with chest pain that started around 9:30 p.m.  He described the pain as  crushing, substernal, having the same sensation as he had when he has his  myocardial infarction.  The pain lasted about an hour, until EMS arrived and  gave him four tablets of aspirin.  The pain was not associated with nausea,  vomiting, or diaphoresis.   PHYSICAL EXAMINATION AT THE TIME OF ADMISSION:  VITAL SIGNS:  Showed a  temperature of 97, pulse 51, respirations 19, blood pressure 118/93,  saturating 99% on room air.  HEART:  Significant for a bradycardia with a regular rhythm.  Distant heart  sounds.  Possible soft systolic ejection murmur.  LUNGS:  Clear to auscultation without crackles.  EXTREMITIES:  Without edema.  Pulses were +2 bilaterally.  RECTAL:  His fecal occult blood test, on admission, was negative.  NEUROLOGIC:  Intact on admission.   His blood work, on admission, was significant for a creatinine of 1.5, BUN  of 22, glucose of 185.  CK, CK-MB, myoglobin, and troponin I, cardiac  markers in the emergency room negative x 3.  His hemoglobin, on admission,  was 10.6, hematocrit of 31.4.   His EKG showed sinus bradycardia, a first degree A-V block alternating with  a second-degree A-V block Wenckebach type, right bundle branch block, and  sinus pauses.   His chest x-ray showed cardiomegaly and mild interstitial edema.   HOSPITAL COURSE:  1. Unstable angina.  [name] was admitted to the telemetry unit.  He was     placed on aspirin, Plavix, heparin intravenously.  Because at the time of     admission he did not have any chest pain, he was not stared on     nitroglycerin IV.  As his cardiac markers were negative, he was not     started on Integrilin.  Dr. Einar Gip from [facility], and Dr.    Lovena Le from [facility] consulted and the consensus was to proceed     with coronary catheterization which was  done on Jan 28, 2004.  On coronary  catheterization, three vessel disease was found with 95% restenosis of     the previous implanted stent.  He had an intervention with placement of a     new stent and a TIMI-3 flow post procedure.  Mr. Uppal was completely     pain free post procedure.  The afternoon after the procedure, he had     bleeding at the site of the femoral sheath, which was controlled after     constant compression was applied.  At the time of discharge, Mr. Figaro     was pain free, and he was instructed to followup with the [facility]     Cardiology for possible consideration of intervention on the right     coronary artery.  He was discharged on aspirin, Plavix, Zocor, and a beta-     blocker, and ACE inhibitors.  He was also given nitroglycerin 0.4 mg to     take sublingually as needed for chest pain.   1. Ischemic cardiomyopathy.  Once again, the cardiac catheterization     confirmed the fact that Mr. Endrizzi has severe dilated ischemic     cardiomyopathy with an ejection fraction of 20%.  At this point, he is     very well compensated, and his symptoms are at most NYHA class II.  He     will continue on his medications with a reduced dose of Coreg to 6.25     twice a day and the same dose of Altace, same dose of Furosemide as     before admission.  At the time of discharge, the Digoxin was held because     on admission he was having sinus pauses and second-degree A-V block.   1. Diabetes mellitus.  While in the hospital, Mr. Lutman was very well     controlled on his previous dose of Glipizide 10 mg b.i.d. and sliding     scale insulin.  There were no episodes of hyper or hypoglycemia.  His     last hemoglobin A1c was 6.9.  He will continue on Glipizide at home.   1. Anemia.  Mr. Waite was evaluated in the past, and he was found to have a     vitamin B-12 deficiency.  He is scheduled to have monthly vitamin B-12     injections at [facility].  A fecal  occult blood test,     repeated in the hospital, was weakly positive, and his ferritin was low     at 11.  It seems like his anemia is a combination of vitamin B-12     deficiency and iron deficiency.  He had a colonoscopy in the remote past,     so at this point he probably needs to be scheduled for another     colonoscopy.  The exact timing of that probably will have to wait until     the patient gets an implantable cardiac defibrillator.   1. Hyperlipidemia.  The patient was kept on Zocor while in the hospital, and     he will continue with that at home.   LABORATORY EXAM AT THE TIME OF DISCHARGE:  Sodium 138, potassium 3.7,  chloride 110, bicarbonate 26, BUN 11, creatinine 1.2, glucose 101.  White  blood cells 10.5, hemoglobin 10.7, hematocrit 32, platelets 286.  A blood  gas was obtained and had a pH of 7.4, pCO2 38.7, pO2 94, bicarbonate 24.2.  Edythe Lynn, M.D.    SL/MEDQ  D:  01/29/2004  T:  01/30/2004  Job:  [id number]   cc:   Jay Schlichter, M.D.  [address] [place], [place] [address]  Fax: [place] Lovena Le, M.D.   Minus Breeding, M.D.   [facility]   Junious Silk, M.D. [facility]

## 2011-02-10 NOTE — Op Note (Signed)
NAME:  TAVARUS, MADRIAGA                        ACCOUNT NO.:  [id number]   MEDICAL RECORD NO.:  [id number]                   PATIENT TYPE:  INP   LOCATION:  3304                                 FACILITY:  [facility]   PHYSICIAN:  Christy Sartorius, M.D.                   DATE OF BIRTH:  01/03/1930   DATE OF PROCEDURE:  09/11/2003  DATE OF DISCHARGE:  09/12/2003                                 OPERATIVE REPORT   PERFORMING PHYSICIANS:  Christy Sartorius, M.D. and Dorothea Glassman, M.D.   PROCEDURES PERFORMED:  1. Aortic arch angiography.  2. Bilateral selective cerebral angiogram.  3. Percutaneous transluminal angioplasty and stent placement of the left     internal carotid artery.   DIAGNOSES:  1. Cerebrovascular disease.  2. Status post left brain stroke.   INDICATIONS FOR PROCEDURE:  Mr. Golas is a 77 year old black gentleman  with advanced  ischemic cardiomyopathy with an ejection fraction of 15%,  diabetes mellitus, dyslipidemia, who presents with right-sided numbness,  paresthesias, consistent with left brain  stroke. The patient was admitted  to [facility] today, anticoagulated and had complete resolution of  deficits. Noninvasive studies suggest high-grade narrowing of the left  internal carotid artery with velocities of 519/215 cm/second. He presents  for further angiographic assessment and treatment, given his high risk for  surgical endarterectomy.   DESCRIPTION OF PROCEDURE:  Informed consent was obtained. The patient was  brought to the catheterization laboratory. A 6 French sheath was placed in  the right femoral artery using modified Seldinger technique. A 5 French  pigtail catheter was advanced into the aortic arch and an aortic arch  angiogram was performed using power injection of contrast.   Subsequently an H1 catheter was used to engage the right innominate and was  used to perform angiography  of the right vertebral artery. This was then  used to engage the right  common carotid artery. And selective cerebral  angiogram was then performed. This catheter was  then exchanged for a  Simmons 1 catheter, which was used and selectively engaged the left common  carotid artery and selective angiography  was performed.   Initial findings are as follows:  1. The aortic arch is of normal caliber. It is a type 1 arch with mild     atheromatous disease. 2.  The right vertebral artery is patent. It is a     dominant vessel. It has mild irregularities.  2. Right carotid artery. The common carotid artery has mild irregularities.     The bifurcation is patent with mild disease of 10%. There is mild disease     of 10% to 15% in the M2 segment. The right carotid artery is a dominant     vessel supplying the anterior cerebral arteries with patent communicating     system.  3. Left carotid artery. The common carotid artery has mild irregular plaque  of 20% to 30% prior to the bifurcation. The external carotid artery has     mild irregularities. The internal carotid artery then has a high-grade     concentric narrowing of 90% of the proximal  segment. There is then  mild     disease in the intracranial portion with underfilling of the anterior     cerebral artery.  4. 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   With these findings we elected to proceed with percutaneous intervention on  the left internal carotid artery. The patient was given heparin systemically  to maintain an ACT of approximately 300 seconds. Inotropic agents were on  standby but were not used during the procedure.   Using the  [facility] catheter, and angular glide wire was positioned in the  external carotid artery and the [facility] catheter as well as the right sheath  were exchanged for a 6 [place] shuttle sheath. This was positioned in the  midsection of the left common carotid artery and selective guide shots were  obtained. A 6.5-mm Accunet distal protection device was then carefully   advanced  past stenosis and deployed in the P2 segment of the internal  carotid artery, and angiography confirmed position against the lumen of the  vessel. A  4 x 2 Aviator balloon  was introduced and used to predilate the lesion. A  single inflation was performed at 6 atmospheres for 15 seconds.   An 8 x 40 Precise stent was then carefully positioned in the proximal  segment of the left internal carotid artery with extension in the common  carotid artery and deployed. This was post dilated using a 0.5 x 2 Aviator  balloon  inflated to 6 atmospheres for 10 seconds. Repeat  angiography  showed an excellent result with full apposition of the stent with the vessel  wall with only mild residual  narrowing of 10% to 15%. The retrieval sheath  was introduced and the filter wire  retracted.   Final angiography was then  performed showing less than 10% to 15% residual  narrowing of the internal carotid artery. Intracerebral angiogram showed no  new deficits. The guide catheter was then removed with manual injection of  contrast showing no damage to the common carotid artery. The sheath was  retracted and  the short 6 French sheath was positioned in the right  femoral artery.   The patient tolerated the procedure well. He was transferred to the floor  neurologically intact with stable hemodynamics.   FINAL RESULT:  A successful percutaneous transluminal angioplasty and stent  placement to the left internal carotid artery with reduction of 90%  narrowing to 10% with placement  of an  8 x 40 Precise  stent dilated to 5.5 mm.   ASSESSMENT AND PLAN:  Mr. Gutridge is a 77 year old gentleman  who is status  post  left brain  stroke who has undergone percutaneous intervention. The  patient will be observed in the hospital on nitroglycerin for 24 hours  with  close attention to hemodynamics. He will be placed on Plavix for a minimum 6 months time. We will follow his neurologic examination as an  inpatient as  well as an outpatient. A carotid ultrasound will be performed at 1 and 6  months time.  Christy Sartorius, M.D.    NG/MEDQ  D:  09/11/2003  T:  09/12/2003  Job:  [id number]   cc:   Larey Dresser, M.D.  [facility]  [place], [place] [address]  Fax: [phone]   P. Drucie Opitz, M.D.  [address]  [place]  [place] [address]   Pramod P. Leonie Man, MD  Fax: [phone]

## 2011-02-10 NOTE — Assessment & Plan Note (Signed)
[facility] OFFICE NOTE   NAME:ECHOLSAlok, Mezzanotte                     MRN:          [id number]  DATE:04/10/2006                            DOB:          01/03/1930    Mr. Consentino returns today for followup.  He is a very pleasant elderly man  with an ischemic cardiomyopathy, VT, and bundle branch block, who is status  post single chamber defibrillator implant, now over two years ago.  He  returns today for followup.  He denied any chest pain or shortness of  breath.   The patient's medications include Protonix, Coreg 25 twice daily, Aggrenox,  glipizide, simvastatin, and Allopurinol.   PHYSICAL EXAMINATION:  GENERAL:  He is a pleasant elderly man in no acute  distress.  VITAL SIGNS:  Blood pressure 140/74, pulse 60 and regular, respirations 18.  Weight 170 pounds.  NECK:  No jugular venous distention.  LUNGS:  Clear bilaterally to auscultation.  CARDIOVASCULAR:  Regular rate and rhythm with normal S1 and S2.  EXTREMITIES:  No clubbing, cyanosis or edema.   Interrogation of his defibrillator demonstrates a Medtronic Onyx implanted  back in August, 2005.  The R waves were 10 mV, pacing impedence 521 ohm,  then a threshold of 1 volt at 0.2 ms.  __________ were 2.99 volts.  There  are no intercurrent IC therapies.   IMPRESSION:  1.  Ischemic cardiomyopathy.  2.  Ventricular tachycardia.  3.  Bundle branch block.  4.  Status post implantable cardioverter/defibrillator implantation.   DISCUSSION:  Overall, Mr. Postiglione is stable.  I have asked that he maintain a  low sodium diet, continue his medications, and exercise regularly.  We will  plan to see him back in the office in one year.  He will follow up with [facility].                                   Champ Mungo. Lovena Le, MD   GWT/MedQ  DD:  04/10/2006  DT:  04/10/2006  Job #:  [id number]   cc:   Jay Schlichter, MD

## 2011-02-10 NOTE — Op Note (Signed)
NAME:  [name], [name]              ACCOUNT NO.:  [id number]   MEDICAL RECORD NO.:  [id number]          PATIENT TYPE:  AMB   LOCATION:  ENDO                         FACILITY:  [facility]   PHYSICIAN:  John C. Amedeo Plenty, M.D.    DATE OF BIRTH:  01/03/1930   DATE OF PROCEDURE:  09/05/2004  DATE OF DISCHARGE:                                 OPERATIVE REPORT   PROCEDURE PERFORMED:  Esophagogastroduodenoscopy.   ENDOSCOPIST:  Missy Sabins, M.D.   INDICATIONS FOR PROCEDURE:  Heme positive stools and anemia.   DESCRIPTION OF PROCEDURE:  The patient was placed in the left lateral  decubitus position and placed on the pulse monitor with continuous low-flow  oxygen delivered by nasal cannula.  He was sedated with 40 mcg IV fentanyl  and 4 mg IV Versed.  The Olympus video endoscope was advanced under direct  vision through the oropharynx and esophagus.  The esophagus was straight and  of normal caliber with the squamocolumnar line at 38 cm.  There was no  visible hiatal hernia, ring, stricture or other abnormality of the distal  esophagus or gastroesophageal junction.  The stomach was entered and a small  amount of liquid secretions was suctioned from the fundus.  A retroflex view  of the cardia was unremarkable.  There was some proximal erythema and  granularity consistent with a mild proximal gastritis.  The fundus, body and  antrum appeared relatively normal.  The pylorus was nondeformed and easily  allowed passage of the endoscope tip into the duodenum.  Both the bulb and  second portion were well inspected and appeared to be within normal limits.  The scope was then withdrawn and the patient prepared for colonoscopy.  The  patient tolerated the procedure well.  There were no immediate  complications.   IMPRESSION:  Mild antral gastritis.  Otherwise normal study.   PLAN:  Will proceed with colonoscopy.      JCH/MEDQ  D:  09/05/2004  T:  09/05/2004  Job:  [id number]   cc:   Jay Schlichter,  M.D.  [address], [place] [address]  Fax: [phone]

## 2011-02-10 NOTE — Op Note (Signed)
NAME:  [name], [name]                        ACCOUNT NO.:  [id number]   MEDICAL RECORD NO.:  [id number]                   PATIENT TYPE:  INP   LOCATION:  6532                                 FACILITY:  [facility]   PHYSICIAN:  Champ Mungo. Lovena Le, M.D.               DATE OF BIRTH:  01/03/1930   DATE OF PROCEDURE:  05/24/2004  DATE OF DISCHARGE:                                 OPERATIVE REPORT   PROCEDURE PERFORMED:  Implantation of a single-chamber defibrillator.   INDICATIONS:  Ischemic cardiomyopathy, class II heart failure, right bundle  branch block, with a history of nonsustained ventricular tachycardia.   INTRODUCTION:  The patient is a 77 year old male with a history of ischemic  cardiomyopathy, status post myocardial infarction, status post stenting.  He  has an ejection fraction of 25% by echo.  He has right bundle branch block  and class II heart failure.  He is admitted for single-chamber ICD  insertion.   PROCEDURE:  After informed consent was obtained, the patient was taken to  the diagnostic EP lab in the fasted state.  After the usual preparation and  draping, intravenous fentanyl and midazolam were given for sedation.  Lidocaine 30 mL was infiltrated into the left infraclavicular region.  A 9  cm incision was carried out over this region and electrocautery utilized to  dissect down to the fascial plane.  Ten milliliters of contrast was injected  into the left upper extremity venous system, demonstrating a patent left  subclavian vein.  It was subsequently punctured and the Medtronic model 6949  65-cm active-fixation defibrillation lead, serial number [id number], was  advanced into the right ventricle.  Mapping was carried out in the right  ventricle, and at the final site the R-waves measured 20 mV and the pacing  threshold was 0.8 V at 0.5 msec.  Once the lead was actively fixed, the  pacing impedance was 711 Ohms.  Ten-volt pacing did not stimulate the  diaphragm.  With  the defibrillation lead in satisfactory position, it was  secured to the subpectoralis fascia with a figure-of-eight silk suture.  In  addition, the sewing sleeve was secured with silk suture.  Electrocautery  was utilized to make a subcutaneous pocket.  Kanamycin irrigation was  utilized to irrigate the pocket and electrocautery utilized to assure  hemostasis.  The Medtronic Onyx VR model [id number], single-chamber defibrillator,  serial number [id number], was connected to the defibrillation lead and  placed in the subcutaneous pocket.  The generator was secured with silk  suture. The patient was more deeply sedated and defibrillation threshold  testing carried out.   After the patient was more deeply sedated with fentanyl and Versed, VF was  induced with a T-wave shock.  A 12-joule shock was then delivered, which  terminated ventricular fibrillation and restored sinus rhythm.  Five minutes  were allowed to elapse and a second DFT test carried out.  This time  VF was  induced with a T-wave shock and an 8-joule shock was delivered, which failed  to terminate ventricular fibrillation.  A 20-joule shock was then delivered  through the device, which terminated ventricular fibrillation and restored  sinus rhythm.  At this point no additional defibrillation testing was  carried out and the incision was closed with a layer of 2-0 Vicryl, followed  by a layer of 3-0 Vicryl, followed by a layer of 4-0 Vicryl.  Benzoin was  painted on the skin and Steri-Strips were applied and a pressure dressing  placed, and the patient returned to his room in satisfactory condition.   COMPLICATIONS:  There were no immediate procedural complications.   RESULTS:  This demonstrates successful implantation of a Medtronic single-  chamber defibrillator in a patient with known ischemic cardiomyopathy, class  II heart failure, right bundle branch block, and nonsustained ventricular  tachycardia.                                                Champ Mungo. Lovena Le, M.D.    GWT/MEDQ  D:  05/24/2004  T:  05/24/2004  Job:  [id number]   cc:   Jay Schlichter, M.D.  [facility] [place]. [address], [place] [address]  Fax: [phone]

## 2011-02-10 NOTE — Discharge Summary (Signed)
[name], [name]              ACCOUNT NO.:  [id number]   MEDICAL RECORD NO.:  [id number]          PATIENT TYPE:  INP   LOCATION:  [id number]                         FACILITY:  [facility]   PHYSICIAN:  Remigio Eisenmenger, M.D.     DATE OF BIRTH:  01/03/1930   DATE OF ADMISSION:  04/12/2005  DATE OF DISCHARGE:  04/14/2005                                 DISCHARGE SUMMARY   RESIDENT PHYSICIAN:  Gust Brooms, M.D.   ATTENDING PHYSICIAN:  Thomes Lolling, M.D.   DISCHARGE DIAGNOSES:  1.  Acute renal failure.  2.  Syncopal episode.  3.  Ischemic cardiomyopathy secondary to coronary artery disease, status      post stenting of the left circumflex, requiring re-stent in May 2005;      with chronic occlusion 100% of left anterior descending with collateral      filling, moderate right coronary artery disease; ejection fraction 30%      to 40%; status post implantable cardiac defibrillator placement in      August 2005.  4.  Diabetes.  5.  Hyperlipidemia.  6.  Hypertension.  7.  Extracranial cerebrovascular occlusive disease, status post right      carotid stenting.  8.  History of heme-positive stool, status post colonoscopy, December 2005.      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  9.  Status post esophagogastroduodenoscopy significant only for mild antral      gastritis.   DISCHARGE MEDICATIONS:  1.  Aggrenox 25/200 SR one capful p.o. b.i.d.  2.  Altace 2.5 mg p.o. b.i.d.  3.  Protonix 40 mg p.o. daily.  4.  Coreg 25 mg p.o. b.i.d.  5.  Zocor 80 mg p.o. daily.  6.  Glipizide 10 mg p.o. b.i.d.  7.  The patient should not take aspirin.  8.  The patient should stop taking Plavix.   DISPOSITION AND FOLLOWUP:  [name] was discharged home in stable  condition following hospitalization for acute renal failure, chest pain, and  syncopal episode. He is scheduled to have a BMET drawn on April 19, 2005,  at  [facility] to recheck his creatinine and BUN--and follow up with  Dr. Dorris Carnes on the April 27, 2005, at 12:00 noon. In addition, he will  follow up with Dr. Marinda Elk in the outpatient clinic on Tuesday, May 02, 2005, at 3:30 p.m.   PROCEDURE PERFORMED:  1.  Head CT on July 19th: No significant change. No acute hemorrhage.  2.  Renal ultrasound on July 19th: No hydronephrosis; prominent prostate.  3.  Carotid Doppler on July 20th: Left patent; right irregular plaque in the      proximal ICA with no significant stenosis; left vertebral artery appears      occluded.  4.  Echocardiogram on July 20th: Left ventricular dilation with EF of 30% to      40%.   CONSULTATIONS:  [facility].   BRIEF ADMITTING HISTORY & PHYSICAL:  [name] is a 77 year old male  patient  with a significant history of coronary artery disease, who presented  to the emergency department complaining of chest pain and numbness which  lasted for approximately 8 hours without shortness of breath or diaphoresis.  The patient also reported a history of loss of consciousness one week prior  with significant diaphoresis.  0n admission the patient was found to have a  BUN of 49 and creatinine of 2.6 which is increased from creatinine of 1.5 in  July 2006 one month ago. The patient was admitted for treatment and further  evaluation. Please refer to the H&P in the chart for full admitting details.   ADMISSION LABORATORY:  BMET revealed sodium of 134, potassium 3.4, chloride  102, CO2 21, BUN 49, creatinine 2.6. CBC revealed white blood cell count of  8.0, hemoglobin 14.2, hematocrit 42.3, platelet count 213,000.   HOSPITAL COURSE BY PROBLEM:   PROBLEM #1:  Acute renal failure. On admission the patient was found to have  elevated BUN and creatinine to 49 and 2.6 respectively. This was thought to  be due to some hypokalemia, dehydration secondary to Lasix use at that time.  The  patient's ACE inhibitor was held.  The patient was rehydrated with  serial BMETs to follow the creatinine and BUN. Over the course of this  hospitalization the patient's acute renal failure did improve and at  discharge his BUN was 15 and creatinine was 1.3. On discharge we did restart  his ACE inhibitor with follow-up in one week to monitor the renal function.  A BMET will be checked insure that the creatinine and BUN are remaining  stable.   PROBLEM #2:  Syncopal episode. The patient has known extracranial  cerebrovascular occlusive disease.  He also has significant posterior fossa  disease. This combined with dehydration has contributed to the patient's  syncopal episode. There was no evidence of hemorrhagic stroke or arrhythmia  that may have contributed to the syncopal episode. We did have the patient's  ICD interrogated and it was shown to not have fired or did not have any  significant tachycardia episodes. The patient remained stable throughout  this hospitalization and was discharged with follow up for cardiology to  further evaluate any significant cardiac contributors.   PROBLEM #3:  Chest pain. The patient was ruled out for MI, cardiac enzymes  negative times three.  We did consult cardiology for further evaluation. At  that point they found no cardiac contributors to the chest pain, that the  patient's coronary artery disease was stable, and the defibrillator was  found to have not fired. The patient was maintained with morphine p.r.n. for  pain control. However, the patient had no episodes of pain during this  admission and was discharged in stable condition.   PROBLEM #4:  Peripheral neuropathy: On admission the patient did complain of  some numbness and tingling in the hands and feet. This numbness is of  unclear etiology. The patient did have a CT which revealed no acute  hemorrhagic stroke. In addition we obtain carotid Dopplers which showed essentially no change in  the patient's cerebrovascular occlusive disease. It  is possible that the patient did have TIA contributing to the symptoms. We  did adjust his medications and discontinued his aspirin and Plavix and  changed  him to Aggrenox for CVA protection. This is especially important in  light of the patient's significant cerebrovascular occlusive disease. In  addition, the patient is diabetic and this may contribute to some of the  peripheral neuropathy.  During this hospitalization the patient did not have  any additional episodes of numbness or weakness, and was discharged home in  stable condition.   PROBLEM #5:  Diabetes: The patient was maintained on sliding scale insulin  as well as Glucotrol during this admission. Blood glucose levels remained  stable. The patient was discharged home on his home medication doses.   PROBLEM #6:  Dyslipidemia: The patient was managed with his home medications  of Zocor during this admission. Lipid status remained stable.   DISCHARGE LABORATORY:  BMET revealed sodium 139, potassium 4.0, chloride  110, CO2 23, BUN 15, creatinine 1.3, glucose 129, calcium 9.2.       SR/MEDQ  D:  04/20/2005  T:  04/20/2005  Job:  [id number]   cc:   [facility]   Fay Records, M.D.

## 2011-02-10 NOTE — Cardiovascular Report (Signed)
NAME:  [name], [name]                        ACCOUNT NO.:  [id number]   MEDICAL RECORD NO.:  [id number]                   PATIENT TYPE:  INP   LOCATION:  6529                                 FACILITY:  [facility]   PHYSICIAN:  Junious Silk, M.D. [facility]         DATE OF BIRTH:  01/03/1930   DATE OF PROCEDURE:  01/28/2004  DATE OF DISCHARGE:                              CARDIAC CATHETERIZATION   PROCEDURE PERFORMED:  1. Right and left heart catheterization with coronary angiography and left     ventriculography.  2. Percutaneous transluminal coronary angioplasty utilizing cutting balloon     followed by placement of a drug-eluting stent in the mid left circumflex     coronary artery.   INDICATION:  Mr. Fawcett is a 77 year old male with history of an ischemic  cardiomyopathy.  He has previously been evaluated by cardiovascular surgery  and felt not to be a surgical candidate.  He underwent placement of a driver  bare metal stent in the mid left circumflex in October 2004.  He presented  to the yesterday with recurrent chest pain compatible with unstable angina.  He was referred for cardiac catheterization.   CATHETERIZATION PROCEDURAL NOTE:  An 8 French sheath was placed in the right  femoral vein.  6 French sheath in the right femoral artery.  Right heart  catheterization was performed with a Swan-Ganz catheter.  Coronary  angiography was performed with standard Judkins 6 French catheters.  Left  ventriculography was performed with an angled pigtail catheter.  Contrast  was Omnipaque.  There were no complications.   CATHETERIZATION RESULTS:   HEMODYNAMICS:  1. Right atrial mean pressure 4.  2. Right ventricular pressure 52/6.  3. Pulmonary artery pressure 52/255.  4. Pulmonary capillary wedge pressure A 27, V 35, mean 25.  5. Left ventricular pressure 166/24.  6. Aortic pressure 170/84.  7. There is no aortic valve gradient.   Cardiac output by the thermodilution method is 4.3,  cardiac index 2.3.  Cardiac output by the Fick method is 4.0, cardiac index 2.1.   LEFT VENTRICULOGRAM:  There is severe akinesis of the anterior apical wall  and inferoapical walls.  Ejection fraction is estimated at 20%.  There is  trace mitral regurgitation.   CORONARY ARTERIOGRAPHY (RIGHT DOMINANT):  Left main is normal.   Left anterior descending artery is a 100% occluded in the proximal vessel.  There are bridging collaterals at the site of occlusion filling the mid to  distal LAD as well as large  first diagonal branch.  The distal LAD and  diagonal os all fill via right-to-left collaterals.  This is a chronic total  occlusion.   Left circumflex has as 20% stenosis in the proximal vessel.  There is a  stent in the mid vessel with a diffuse 95% in-stent restenosis.  The  circumflex gives rise to a normal size branching ramus intermedius and a  large branching obtuse marginal which arises  from the distal circumflex.   Right coronary artery is a dominant vessel, but somewhat small.  There is a  tubular 75% stenosis in the proximal to mid vessel.  The distal right  coronary artery gives rise to a normal size posterior descending artery and  two small posterior lateral branches.  The distal right coronary artery does  supply collaterals to the left anterior descending artery and the diagonal  branch.   IMPRESSION:  1. Elevated left heart filling pressures with significant pulmonary     hypertension.  2. Severely decreased left ventricular systolic function secondary to an     ischemic cardiomyopathy.  3. Three-vessel coronary artery disease.  There is a chronic total occlusion     of the proximal left anterior descending with collateral filling of the     distal left anterior descending and diagonal branch.  There is critical     in-stent restenosis in the mid left circumflex.  There is also disease in     the right coronary artery as described of moderate  severity.   PLAN:   Percutaneous intervention of the left circumflex.  See below.   PERCUTANEOUS TRANSLUMINAL CORONARY ANGIOPLASTY PROCEDURAL NOTE:  Following  completion of the diagnostic catheterization, we proceeded with percutaneous  coronary intervention.  We utilized Visipaque contrast for our intervention  because of mildly elevated serum creatinine.  We used a 6 [place] CLS 3.5  guiding catheter.  An Asahi soft wire was advanced under fluoroscopic  guidance into the distal left circumflex.  We then performed percutaneous  transluminal coronary angioplasty of the mid circumflex with a 3.0 x 15-mm  cutting balloon.  Three inflations were performed to 6, 10, and 10  atmospheres respectively.  We then carefully positioned a 3.0 x 28-mm Cypher  drug-eluting stent within the previously placed stent such that the distal  edge of the stent was beyond the previous stent margins.  The stent was  deployed at 16 atmospheres.  We then went back in with 3.25 x 20-mm Quantum  balloon and inflated this to 14 atmospheres in the distal aspect of the  stent and 18 atmospheres in the proximal aspect of the stent.  Intermittent  doses of intracoronary nitroglycerin were administered.  Final angiographic  images were obtained revealing patency of the mid left circumflex with less  than 10% residual stenosis at the stent site and TIMI-3 flow.   COMPLICATIONS:  None.   RESULTS:  Successful percutaneous transluminal coronary angioplasty with  placement of a drug-eluting stent in the mid left circumflex coronary  artery.  A 95% in-stent restenosis was reduced to 0% residual with TIMI-3  flow.   PLAN:  Integrilin will be continued overnight.  The patient will be treated  with Plavix for a minimum of six months, preferably 12 months.  We will  further review the films and compare it to previous catheterizations to determine whether percutaneous coronary intervention of the right coronary  artery would be indicated.                                                Junious Silk, M.D. [facility]    MWP/MEDQ  D:  01/28/2004  T:  01/29/2004  Job:  [id number]   cc:   Champ Mungo. Lovena Le, M.D.   [facility]

## 2011-02-10 NOTE — Discharge Summary (Signed)
NAME:  [name], [name]                        ACCOUNT NO.:  [id number]   MEDICAL RECORD NO.:  [id number]                   PATIENT TYPE:  INP   LOCATION:  [id number]                                 FACILITY:  [facility]   PHYSICIAN:  Larey Dresser, M.D.             DATE OF BIRTH:  01/03/1930   DATE OF ADMISSION:  09/06/2003  DATE OF DISCHARGE:  09/12/2003                                 DISCHARGE SUMMARY   DISCHARGE DIAGNOSES:  1. Transient ischemic attack of the left carotid secondary to left carotid     stenosis.  2. Left carotid stenosis, 90%, status post stent placement.  3. Diabetes.  4. Hypertension.  5. Peripheral vascular disease.  6. Coronary artery disease.  7. B12 deficiency.   DISCHARGE MEDICATIONS:  1. Lasix 20 mg daily.  2. Plavix 75 mg daily.  3. Vitamin B12 1000 mg once monthly IM.  4. Altace 5 mg daily.  5. Zocor 40 mg daily.  6. Aspirin 325 mg daily.  7. Glucophage 500 mg twice daily.  8. Protonix 40 mg once daily.  9. Phenergan 25 mg every 6 hours as needed for nausea.   FOLLOW UP:  1. Patient is to follow up with Dr. Amedeo Plenty in two weeks at the [place] office.     The office will call the patient with an appointment.  2. Patient is to follow up with [facility] in two     months.  3. Patient is to follow up with [facility], as previously     scheduled.   CONSULTATIONS:  1. Dr. Christy Sartorius, [facility], vascular section.  2. Stroke service.  3. CVTS, Dr. Amedeo Plenty.   PROCEDURES PERFORMED:  1. Head CT on arrival, which shows no acute changes, on September 06, 2003.  2. Carotid Dopplers bilaterally that showed right internal wall changes of     CCA with mild intragenic plaques at the bifurcation extending into the     aorta of the ICA with no evidence of right ICA stenosis.  Shows more than     80% left ICA stenosis by velocity with turbulent flow, disorder stenosis.     Also, 93% aerator reduction in the proximal to  mid ICA.  It also showed     bilaterally high bifurcations with ECS stenosis on the right, more than     the left.  3. Left ICA stenting performed by Dr. Lyndel Safe and Dr. Amedeo Plenty.  This procedure     was performed on September 11, 2003 with left ICA 90% reduced to 10% and a     pre-stent placed, 8 x 40.   HISTORY AND PHYSICAL:  [name] is well known to our service.  He is a 77-  year-old African-American male with multiple medical problems, including  diabetes, coronary artery disease, status post stent of his left __________  in October, 2004, and an EF of 10-15%, diffuse left ventricular  hypertension.  Also, a history of left carotid artery stenosis on his right,  dyslipidemia, DJD, etc.  He came into the emergency room on September 06, 2003 with right-sided numbness and tingling, which woke him up early in the  morning.  It also involved the entire right side of his body, including the  right side of his face.  The patient also disclosed the inability to move  his right side.  By the time he was seen in the ED, some of his symptoms  have disappeared.  He was also found to have a blood pressure of 160/71,  pulse  76, temperature 97, respiratory rate 18, SATs of 98% on room air.  His neuro exam at that point has gradually resolved, or normalized.  Patient  was therefore admitted for evaluation of his TIA.  Accordingly, Dr. Donnetta Hutching  was called, CVTS consult, as well as stroke service.   LABS ON ADMISSION:  White count 6.0, hemoglobin 12.5, platelets 306.  Sodium  138, potassium 4.1, chloride 103, CO2 30, glucose 198, BUN 10, creatinine  1.2.  His LFTs were entirely normal.   HOSPITAL COURSE:  1. TIA:  Patient was admitted for evaluation.  Carotid Dopplers were     performed.  He received aspirin, Plavix, and heparin was started.     Patient finally had stenting done on the 17th.  He subsequently did well     and was therefore discharged.  He is to follow up with CVTS as an     outpatient.   During hospitalization, patient did not show anymore     problems.  He had normal GN'S and no more evidence of TIAs.  He is mobile     and continued to do so as of the time of discharge.  Stroke workup     performed included a lipid panel, which was fasting.  RPR was negative.     All other workups were negative.  2. Diabetes:  Patient's hemoglobin A1C was 6.6.  His diabetes was currently     controlled using sliding-scale insulin and his Glucophage.  At the time     of discharge, his CBGs looked good.  3. Vitamin B12 deficiency:  The patient had part of his workup for anemia.     It showed a B12 level of 172, which is low.  He was therefore started on     some B12 IM and will be followed up as an outpatient.  4. Hypertension:  Patient's blood pressure looked good throughout his     hospitalization.  He has had some bouts of increased blood pressure due     to occasional holding of his medications; however, we aimed at keeping     his blood pressure at least around 160 so that we could maintain cerebral     perfusion.  5. Coronary artery disease:  Patient was stable from this point of view.  No     chest pain.  With his EF of 15%, carotid endarterectomy, which was     originally planned, was shelved.  In place of that, he has a carotid     stent, which he responded to very well, and which studies have shown to     have a better outcome in patients like this.   DISCHARGE LABS:  Patient's labs on discharge included a white count of 8.2,  hemoglobin 10.4, and platelets of 238.  Sodium 137, potassium 3.7, chloride  105, CO2 28, BUN 8,  creatinine 0.9, glucose 143, calcium 8.8.  PT 13.7, INR  1.1, PTT 36.      Barbette Merino, M.D.                         Larey Dresser, M.D.    LG/MEDQ  D:  09/12/2003  T:  09/13/2003  Job:  [id number]   cc:   Christy Sartorius, M.D.   Dorothea Glassman, M.D.  [address]  [place]  [place] [address]   Pramod P. Leonie Man, MD  Fax: [phone]

## 2011-02-10 NOTE — Cardiovascular Report (Signed)
NAME:  [name], [name]                        ACCOUNT NO.:  [id number]   MEDICAL RECORD NO.:  [id number]                   PATIENT TYPE:  INP   LOCATION:  3702                                 FACILITY:  [facility]   PHYSICIAN:  Octavia Heir, M.D.             DATE OF BIRTH:  01/03/1930   DATE OF PROCEDURE:  07/14/2003  DATE OF DISCHARGE:                              CARDIAC CATHETERIZATION   PROCEDURES PERFORMED:  1. Coronary angiography.  2. Left circumflex:     a. Placement of intracoronary stent.     b. Percutaneous transluminal coronary balloon angioplasty.   COMPLICATIONS:  None.   INDICATIONS:  [name] is a 77 year old male patient of Alison Murray,  M.D., Bryson Dames, M.D. initially admitted for chest pain.  He is  noted to have CAD with a history of MI in 1990 with a known total occlusion  of the LAD.  He underwent cardiac catheterization by Bryson Dames, M.D.  on July 09, 2003 revealing diffuse three vessel CAD with EF approximately  10-15%.  He was seen by CVTS for possible coronary artery bypass surgery;  however, they wanted the patient to have a viability scan to see if he had  ischemic areas in his LAD or circumflex territory.  This revealed diffuse  scar, but no evidence of ischemia.  However, it was felt that the patient  would benefit from percutaneous revascularization of the left circumflex  system.  He is now brought for percutaneous intervention of the circumflex.   DESCRIPTION OF OPERATION:  After giving informed written consent, patient  was brought to the cardiac catheterization laboratory.  Right and left groin  shaved, prepped and draped in usual sterile fashion.  ECG monitor  established.  Next, a #7-French arterial sheath inserted in right femoral  artery.  Following this, a #7-French Voda 3.5 guiding catheter with side  holes was correctly engaged in the left coronary ostium.  Next, a 0.014 ATW  marker wire was advanced down the  guiding catheter into the proximal  circumflex.  This wire was then used to cross the mid circumflex stenotic  lesion, positioned in a first OM without difficulty.  Next, a Medtronic  Driver 3.0 x 24 mm stent was then used to cross the mid circumflex stenotic  lesion.  This stent was then deployed to a maximum of 16 atmospheres for a  total of one minute.  Follow-up angiogram revealed no evidence of dissection  or thrombus with excellent luminal gain.  However, the stent appeared to be  somewhat under deployed in the mid portion of the stent at an area of  calcification.  The stent balloon was removed and a PowerSail 3.5 x 15 mm  balloon was then tracked into the midportion of the stent.  Two subsequent  inflations to a maximum of 18 atmospheres for a total of approximately one  minute 15 seconds was performed in  the mid portion of the stent.  There was  some increase in expansion; however, there did appear to be remaining waste.  However, I did not feel it was safe to go up beyond 18 atmospheres.  He  remained completely asymptomatic.  IV Angiomax was used throughout the case.  Of note, we did place a bare metal stent as it is believed that the patient  probably would benefit from biventricular ICD and we do not wish to have him  on prolonged Plavix.   On final orthogonal angiograms reveal less than 10% residual stenosis in the  mid circumflex stenotic lesion with TIMI 3 flow to the distal vessel.  At  this point, it was elected to conclude the procedure.  All balloons, wires  and catheters were removed.  Hemostatic sheath was sewn in place and the  patient transferred back to the ward in stable condition.    CONCLUSION:  1. Successful placement of a Driver 3.0 x 24 mm stent to the mid circumflex     stenotic lesion ultimately post dilated with a PowerSail 3.5 x 15 mm     balloon.  2. Adjunct use of Angiomax infusion.  3. Chronic subtotal occlusion of the proximal LAD and  diagonal.                                               Octavia Heir, M.D.    RHM/MEDQ  D:  07/14/2003  T:  07/14/2003  Job:  [id number]   cc:   Bryson Dames, M.D.  [address] [place] [address]  Fax: [phone]

## 2011-02-10 NOTE — Assessment & Plan Note (Signed)
[place]                         ELECTROPHYSIOLOGY OFFICE NOTE   NAME:Buss, KYNDEL GRIEPENTROG                     MRN:          [id number]  DATE:10/12/2006                            DOB:          01/03/1930    Per patient call on the 9th of this month complaining that there was a  strange sensation in his shoulder around his defibrillator, he was not  sure if he was getting shocked or not.  I instructed the gentleman to  send a [facility] transmission.  After review of the transmission, where  there were no shocks there but, also, in review of this gentleman's  file, he has one of the 6949 alert leads and was still complaining of  the sensation in his shoulder, I elected to bring him in today in the  clinic on October 12, 2006.  On interrogation of his device today, his  battery voltage is 2.88 with a charge time of 6.94 seconds.  There were  no episodes recorded since last interrogation.  His R-waves measured 11  mV with a ventricular capture threshold of 1 V at 0.2 msec and a  ventricular lead impedence of 521 ohms.  Shock impedence was 18 ohms.  I  have reprogrammed the device per protocol for the lead and he will check  back and follow with [place] in 4 months' time with his [facility]  transmissions.      Alma Friendly, LPN  Electronically Signed      Champ Mungo. Lovena Le, MD  Electronically Signed   PO/MedQ  DD: 10/12/2006  DT: 10/12/2006  Job #: [id number]

## 2011-02-10 NOTE — Consult Note (Signed)
NAME:  [name], [name]                        ACCOUNT NO.:  [id number]   MEDICAL RECORD NO.:  [id number]                   PATIENT TYPE:  INP   LOCATION:  [place]                                 FACILITY:  [facility]   PHYSICIAN:  Michael L. Doy Mince, M.D.           DATE OF BIRTH:  01/03/1930   DATE OF CONSULTATION:  DATE OF DISCHARGE:                                   CONSULTATION   PRIMARY CARE PHYSICIAN:  Jay Schlichter, M.D.   REASON FOR EVALUATION:  Carotid stenosis.   HISTORY OF PRESENT ILLNESS:  This is the initial inpatient consultation and  evaluation of this 77 year old man admitted to the Medicine Teaching Service  on December 12 after presenting to the emergency room with a transient  episode of left hemiparesis.  The patient says that he awoke at 5 a.m. with  a numbness in the right hand that spread to the entire side of his right  body and the right side of his face.  He was unable to move the right side  of his body, and he had slurred speech, although he was able to talk some.  He said this lasted 30 minutes to an hour and subsequently resolved.  He was  seen in the emergency room where his blood sugar was 30, and he was  subsequently admitted for further evaluation.  He did have a known severe  left carotid stenosis which has been followed by Dr. Amedeo Plenty, and carotid  Doppler study during this hospitalization did confirm the presence of  carotid stenosis of greater than 80%.  He is felt to be a very poor surgical  candidate due to ischemic cardiomyopathy.  He is consequently being  considered for carotid stenting and neurology service is evaluating him  according to the institution's carotid stenting protocol.   The patient himself has no complaints at this time.  He says he feels  completely back to baseline with regards to his right-sided symptoms.  The  pros of the stenting procedure have been discussed with him, and he is in  agreement to proceeding.   PAST MEDICAL  HISTORY:  Remarkable for the carotid stenosis as noted above.  He also has a history of MI x2 in 1990, and recurrent subsequent angina.  He  had a stent of the left circumflex in October of this year after being  turned down by Dr. Remo Lipps C. Roxan Hockey as too high a surgical risk for  coronary artery bypass graft because of his history of hypertension,  dyslipidemia, congestive heart failure.   FAMILY/SOCIAL/REVIEW OF SYSTEMS:  Outlined in the patient's admission H&P.  Pertinent positives include some nausea and vomiting about three days prior  to admission with poor p.o. intake as well as a previously unknown B12  deficiency was discovered on admission.   MEDICATIONS PRIOR TO ADMISSION:  1. Zocor.  2. Altace.  3. Lasix.  4. Aspirin.  5. Phenergan p.r.n.  6.  Glipizide.  7. Glucophage.  8. Protonix.   He was receiving the same medications in the hospital as well as a heparin  drip.  He also received a vitamin B12 injection.  He was found to be B12 deficient  on admission.   PHYSICAL EXAMINATION:  VITAL SIGNS:  Temperature 97.8, blood pressure  109/62, pulse 73, respirations 20.  O2 saturation 100% on room air.  GENERAL:  This is a healthy-appearing man, in no acute distress.  HEENT:  Head, cranium normocephalic, atraumatic.  Oropharynx is benign.  NECK:  Supple without carotid bruits.  HEART:  Regular rate and rhythm without murmur.  NEUROLOGIC:  Mental status.  He is awake, alert, fully oriented.  Speech is  fluent and not dysarthric.  Mood is euthymic.  Affect is appropriate.  Cranial nerves:  The pupils are equal and reactive.  Extraocular movements  are full without nystagmus.  Visual fields are full to confrontation.  Face,  tongue and palate were normal and symmetric.  Motor:  Normal bulk and tone,  normal strength on all face and extremity muscles.  Sensation is intact to  light touch in all extremities.  Coordination:  Rapid movements were  performed well.   Finger-to-nose was performed well. Gait exam is deferred.  Reflexes 2+ and symmetric.  Toes were downgoing.   LABORATORY DATA:  CBC from yesterday, white count 8.1, hemoglobin 13.6,  platelets 325,000.  BMET from this morning remarkable for an elevated  glucose of 118 only.   CT of the head from December 12 reveals no abnormality with an old left  lacunar as noted on an old scan, still present.   MRA of the intracranial and extracranial circulation performed on July 13, 2003, demonstrates critical stenosis in the proximal left internal  carotid artery, and an occluded left vertebral artery.   IMPRESSION:  1. Symptomatic critical left carotid stenosis with left hemispheric TIA and     no evidence of residual neurologic symptoms.  2. Ischemic cardiomyopathy making the patient a poor surgical candidate.   PLAN:  I concur with the notion previously documented by Dr. Amedeo Plenty that the  patient is an excellent candidate for carotid stenting as scheduled.  The  procedure is scheduled for Friday, December 17.   Dr. Leonie Man will follow up in the morning.                                               Michael L. Doy Mince, M.D.    MLR/MEDQ  D:  09/08/2003  T:  09/09/2003  Job:  [id number]   cc:   Dorothea Glassman, M.D.  [address]  [place]  [place] [address]   Bryson Dames, M.D.  [phone] [address], [address] [place] [address]  Fax: [phone]

## 2011-02-10 NOTE — Discharge Summary (Signed)
NAME:  BRAN, LYU                        ACCOUNT NO.:  [id number]   MEDICAL RECORD NO.:  [id number]                   PATIENT TYPE:  INP   LOCATION:  6523                                 FACILITY:  [facility]   PHYSICIAN:  Alison Murray, M.D.               DATE OF BIRTH:  01/03/1930   DATE OF ADMISSION:  07/06/2003  DATE OF DISCHARGE:  07/15/2003                                 DISCHARGE SUMMARY   DISCHARGE DIAGNOSES:  1. Coronary artery disease.  2. Unstable angina.  3. Congestive heart failure.  4. Left carotid stenosis greater than 80%.  5. Dyslipidemia.  6. Diabetes mellitus type 2.   DISCHARGE INSTRUCTIONS:   MEDICATIONS:  1. Zocor 20 mg one p.o. daily.  2. Lasix 20 mg one p.o. daily.  3. Altace 2.5 mg b.i.d.  4. Aspirin 325 mg one p.o. daily.  5. Plavix 75 mg one p.o. daily.  6. Coreg 6.25 mg b.i.d.  7. Protonix 40 mg one p.o. daily.  8. Glucophage 1000 mg b.i.d.  9. Glipizide 10 mg b.i.d.  10.      Tylenol 650 mg q.4h. p.r.n.   The patient instructed not to take aspirin, Advil, or naproxen for pain  relief.   FOLLOW-UP APPOINTMENTS:  1. Dr. Alla German of [facility] on July 31, 2003 at 4:15 p.m.  2. Dr. [name] on August 19, 2003 at 9:25 a.m. in the outpatient     clinic.  3. Dr. Dorothea Glassman of cardiovascular surgery for carotid follow-up on     Monday, August 10, 2003 at 10:30 a.m.   CONSULTATIONS DURING HOSPITALIZATION:  1. [facility].  He was seen by Dr. Melvern Banker as     well as Dr. Tami Ribas.  2. CVTS - Dr. Amedeo Plenty.   PROCEDURES DURING HOSPITALIZATION:  1. Left and right cardiac catheterization on July 08, 2003 that showed     ischemic cardiomyopathy to a severe degree and LV EF of 10-15%; three-     vessel coronary artery disease  with an old recanalized occlusion of the     proximal LAD with right-to-left collaterals and left-to-left collaterals     to the LAD, old 100%  proximal diagonal branch occlusion with left-to-left     collaterals, 90% mid circumflex stenosis, 75% proximal RCA stenosis, 75%     proximal acute marginal branch stenosis of the RCA; moderate pulmonary     hypertension likely secondary to left ventricular failure.  2. Cardiac viability scan which showed apical anteroseptal and inferolateral     areas of scar with redistribution.  3. Bilateral carotid Dopplers on July 09, 2003 with right mild     heterogenous plaque throughout, no ICA stenosis; left mild heterogenous     plaque, severe homogenous plaque, origin into the proximal ICA, greater     than 80% ICA stenosis; the left vertebral  artery difficult to visualize,     appears reversed and stenotic.  Bilateral upper extremity Dopplers within     normal limits.  Lower extremity on the right ABI indicates moderate     reduction of arterial flow; the left ABI indicates mild reduction of     arterial flow.  4. Echocardiogram on July 07, 2003 showed an LV EF of 10-15% with diffuse     left ventricular hypokinesis and left ventricular wall thickness at the     upper limit of normal, aortic valve thickness mildly increased.  5. MRA of the head and neck which showed critical stenosis at the proximal     left ICA and occluded left vertebral artery.  6. Coronary angiography with left circumflex placement of stent and PTCA on     July 14, 2003.   BRIEF HISTORY AND HOSPITAL COURSE:  #1 - Mr. Wilcock is a 77 year old African-  American male with a history of coronary artery disease status post MI x2 in  105 with an ejection fraction at that time of 35%, type 2 diabetes, as well  as dyslipidemia and CHF who has been doing well until the day of admission.  He developed typical cardiac chest pain, left-sided, radiating down his arm  and into his chin not associated with any other symptoms but he was watching  wrestling.  He came to the ED and pain was relieved with nitroglycerin.  He  was  admitted for chest pain and to rule out an MI.  An acute MI was ruled  out with serial enzymes and EKGs but on admission his echocardiogram LV EF  had decreased to 10-15%.  Cardiology was consulted concerning  catheterization versus Cardiolite.  Their initial workup got a  catheterization that showed the same ejection fraction as well as  significant three-vessel disease.  At that time CVTS was consulted and they  were unsure as to how good of a candidate that he was for CABG secondary to  his age and his very poor cardiac function and requested that a nuclear  medicine viability study be performed.  Once this was done it was felt that  the severity of his disease and risks versus benefits the best treatment for  him at this time would be PTCA and stent of his left circumflex vessel.  CABG would be too risky and without the help of a PET scan it would be  difficult to tell if it would even benefit in this patient.  Medical  management was also a consideration if this patient remained well and  asymptomatic throughout his hospitalization.  His preference was to continue  with the angioplasty and stenosis.   #2 - During hospitalization and workup for his CAD bilateral carotid  Dopplers were performed and it was found that he had an 80% left ICA  stenosis.  He was asymptomatic and therefore decided not to be treated at  this time.  If he had gotten a CABG it was likely that he would have gotten  both procedures at the same time, but as he left the hospital it was felt  like he would follow this up as an outpatient.   #3 - DIABETES MELLITUS.  The patient well controlled at home.  His  hemoglobin A1c upon admission was 6.9.  No changes were made to his home  medications.  He was not on Glucophage during hospitalization and was  controlled with sliding scale insulin and Lantus.   #4 - Mr.  Tona is a very energetic and pleasant man who is discharged in good and stable condition.  He stated he  felt that he was better than his  baseline upon discharge.      Agustina Caroli, MD                        Alison Murray, M.D.    JS/MEDQ  D:  08/25/2003  T:  08/25/2003  Job:  [id number]   cc:   Octavia Heir, M.D.  [address], [address]  [place]  [place] [address]  Fax: [phone]   Jay Schlichter, M.D.  [place] [address], [place] [address]  Fax: [phone]   P. Drucie Opitz, M.D.  [address]  [place]  [place] [address]

## 2011-02-10 NOTE — Consult Note (Signed)
NAME:  [name], [name]                        ACCOUNT NO.:  [id number]   MEDICAL RECORD NO.:  [id number]                   PATIENT TYPE:  INP   LOCATION:  6526                                 FACILITY:  [facility]   PHYSICIAN:  Champ Mungo. Lovena Le, M.D.               DATE OF BIRTH:  01/03/1930   DATE OF CONSULTATION:  01/27/2004  DATE OF DISCHARGE:                                   CONSULTATION   REQUESTING PHYSICIAN:  Dr. Alison Murray and colleagues.   INDICATION FOR CONSULTATION:  Evaluation of high-grade heart block in the  setting of sinus node dysfunction in the setting of ischemic cardiomyopathy  with an ejection fraction of 15%.   HISTORY OF PRESENT ILLNESS:  The patient is a 77 year old man who has done  amazingly well over the last 15 years.  He sustained an acute anterior  myocardial infarction in 1990.  He has had a long history of LV dysfunction.  He had recurrent chest pain back in October of 2004 and was admitted and  found to have stenosis of the left circumflex artery and occlusion of the  LAD.  A thallium scan reportedly shows no viability and the patient was  turned down for bypass surgery.  He underwent stenting of the left  circumflex artery and has had no more pain until yesterday, when he was  admitted again with recurrent chest pain which was resolved with aspirin and  nitrates.  The patient did not rule in for a myocardial infarction.  The  patient has, however, had episodes of bradycardia with a sinus node  dysfunction and a junctional escape.  At baseline, he had a sinus rhythm  with a first-degree A-V block, right bundle branch block, and left axis  deviation.  He denies syncope, palpitations or near-syncope.  His additional  past medical history is notable for carotid artery disease, status post  stenting of the left internal carotid by Dr. Christy Sartorius and Dr. Dorothea Glassman in December of 2004.  He has a long history of diabetes.  He has a  history of  dyslipidemia and is on statin drugs.  He does have a history of  noncompliance.  According to records in the chart, he had missed several  appointments for cardiology followup after his stent.  His additional past  medical history is notable for renal insufficiency, gastroesophageal reflux  disease and arthritis in his left hip.   FAMILY HISTORY:  Family history is notable for mother dying at age 52 of  diabetes and MI and father dying at age 63.  He had multiple siblings.   SOCIAL HISTORY:  The patient is divorced and lives with his girlfriend.  He  has a long history of tobacco abuse but quit smoking 35 years ago.  He also  has a long history of alcohol use but also quit that 35 years ago as well.  He denies  cocaine or recreational drug use.   MEDICATIONS:  Medications include Glucophage, glipizide, Lasix, Lanoxin,  aspirin, Protonix, Altace, Plavix and Zocor.  He was on Coreg 25 mg twice a  day by report, but this was discontinued.   REVIEW OF SYSTEMS:  Review of systems is notable for no fevers, chills,  weight loss or fatigue.  He denies sore throat or nose bleeds or other  problems in his HEENT exam.  He has chest pain, as previously noted; this  was not associated with shortness of breath.  He denies palpitations,  orthopnea, PND or syncope.  He denies nausea, vomiting, diarrhea or  constipation.  He denies dysuria, hematuria or nocturia.  He denies any heat  or cold intolerance.  He does have a 20-pound weight loss in the last year.  He does have recurrent hip pain.  He denies depression or anxiety or  dizziness or vertigo.   PHYSICAL EXAMINATION:  GENERAL:  On exam, he is a pleasant 77 year old,  somewhat unkempt-appearing man in no distress.  VITAL SIGNS:  The blood pressure was 118/90.  The pulse was 45 and  irregular.  The respirations were 16.  Temperature was 97.  HEENT:  Normocephalic and atraumatic.  The pupils are equal and round.  The  oropharynx is moist.  The  sclerae were anicteric.  NECK:  Neck revealed no jugular venous distention.  There is no thyromegaly.  Carotids were 2+ and symmetric.  There is a soft left carotid bruit.  There  is no thyromegaly.  CARDIOVASCULAR:  Exam revealed an irregular bradycardia with normal S1 and  S2.  I did not appreciate any murmurs.  LUNGS:  The lungs were clear bilaterally to auscultation.  ABDOMEN:  Soft, nontender and nondistended.  There was no organomegaly.  EXTREMITIES:  Extremities demonstrated no cyanosis, clubbing or edema.  NEUROLOGIC:  He was alert and oriented x3 with cranial nerves grossly  intact.  The strength was 5/5 and symmetric.  PERIPHERAL PULSATIONS:  Pulses were 3+ and symmetric.   LABORATORY AND ACCESSORY CLINICAL DATA:  EKG demonstrates sinus bradycardia  with intermittent junctional escapes.  There was right bundle branch block,  left axis deviation and first-degree A-V block noted.   Cardiac enzymes demonstrated no evidence of acute myocardial infarction.  His BNP was 349.   IMPRESSION:  1. Ischemic cardiomyopathy with severe left ventricular dysfunction.  2. Class I-II heart failure.  3. Right bundle branch block, left axis deviation with first-degree     atrioventricular block (high-grade conduction disease).  4. Sinus node dysfunction (possibly related to digoxin and Coreg).  5. Cerebrovascular disease, status post stenting of the left internal     carotid.  6. Diabetes.  7. Hypertension.   DISCUSSION:  The patient's chest pain is concerning for myocardial ischemia.  I have recommended that he undergo catheterization and this will be  scheduled first.  Pending the results of his catheterization, ICD insertion  will be warranted.  He will at least require dual-chamber device secondary  to his underlying sinus node dysfunction, although I do have concerns about  RV apical pacing which will almost certainly be required secondary to the patient's underlying A-V node and  sinus node conduction system disease.  He  does not meet requirements for BiV defibrillator at the present time  secondary to his class I-II heart failure symptoms.  I will check and see if  he might be a candidate for a recently proposed MADIT-CRT study.  Champ Mungo. Lovena Le, M.D.    GWT/MEDQ  D:  01/27/2004  T:  01/28/2004  Job:  [id number]   cc:   Jay Schlichter, M.D.  [place]. [address] [place] [address]  Fax: [phone]

## 2011-02-10 NOTE — Consult Note (Signed)
NAME:  CRIT, SCHOR NO.:  [id number]   MEDICAL RECORD NO.:  [id number]          PATIENT TYPE:  INP   LOCATION:  [id number]                         FACILITY:  [facility]   PHYSICIAN:  Fay Records, M.D.    DATE OF BIRTH:  01/03/1930   DATE OF CONSULTATION:  04/13/2005  DATE OF DISCHARGE:                                   CONSULTATION   INDICATIONS:  Mr. Iwanicki is a 77 year old gentleman whom we were asked to  see regarding dizziness, syncope.   HISTORY OF PRESENT ILLNESS:  The patient has a history of coronary artery  disease and ischemic cardiomyopathy (LVEF of 15%).  He has an ICD in place.  Also a history of chronic right bundle branch block/nonsustained VT.  About  two weeks ago he said he went to the store, was feeling fine, walked around  at an outdoor mall, got home, went to the bathroom (bowel movement).  He got  up from the bathroom and was walking back to the living room when he became  diffusely sweaty, dizzy, and passed out.  Not sure how long he was out.  Got  up and was fine.  Said he had been drinking adequate fluids that day.  A  week ago, he went to the bathroom and again walking back through the house  broke out into a sweat, got lightheaded, went down to the floor, and he  could hear his friend calling throughout, not clearly syncope.  On Tuesday  of this week he mowed the lawn in the morning, said he was again drinking  adequate fluids.  He went to bed, Wednesday morning woke up at about 4 a.m.,  got out of bed and staggered, sat down and everything went numb in his  chest.  Numb in his arms with dizziness and no syncope.   ALLERGIES:  None.   MEDICATIONS:  1.  Glipizide 10.  2.  Coreg 25 mg b.i.d.  3.  Protonix 40 mg daily.  4.  Iron 325 mg daily.  5.  Lasix 20 mg daily.  6.  Plavix 75 mg daily.  7.  Zocor 80 mg daily.  8.  Altace 10 mg daily.  9.  Glucotrol two in the a.m., one in the p.m.   PAST MEDICAL HISTORY:  1.  Cardiomyopathy  secondary to CAD, last intervention back in May 2005 with      a drug-eluting stent to the left circumflex.  Chronic occlusion of the      LAD that fills via collaterals.  2.  History of cerebrovascular disease, status post stent to the left ICA      December 2004.  3.  Diabetes.  4.  Dyslipidemia.  5.  Chronic right bundle branch block.  6.  History of noncompliance.  7.  History of GE reflux.  8.  History of sinus node dysfunction and heart block, status post [employer]      single-chamber ICD.   SOCIAL HISTORY:  The patient is divorced, lives in [place].  Quit tobacco  35 years ago.  Quit ETOH 35 years ago.  FAMILY HISTORY:  Mother died of diabetic complications and coronary disease.  Father died at age 27 of coronary disease.  One brother with coronary artery  disease.   REVIEW OF SYSTEMS:  All systems reviewed, negative to the above problems  except as noted previously.   PHYSICAL EXAMINATION:  GENERAL:  On exam, the patient is currently in no  distress in a recliner.  VITAL SIGNS:  On admit, orthostatics:  Lying 130/68, pulse 68; sitting  138/76, pulse 66; standing 112/62, pulse 72.  Currently blood pressure is  113/64, pulse is 63, temperature is 97.8.  HEENT:  Normocephalic, atraumatic.  EOMI.  PERRL.  NECK:  JVP normal.  LUNGS:  Relatively clear.  CARDIAC:  Regular rate and rhythm, S1, S2, no definite S3.  ABDOMEN:  Supple, no hepatomegaly.  EXTREMITIES:  2+ pulses, no edema.   Chest, head CT negative.  Chest x-ray:  Mild cardiomegaly.  A 12-lead EKG:  Sinus rhythm at a rate of 69 beats per minute.  First degree AV block with  PR interval of 270 msec.  Left anterior fascicular block.   Labs significant for hemoglobin of 13.  BUN and creatinine of 27 and 1.7.  Improved from admit.  Potassium of 4.2.  D-dimer 0.41.  Hemoglobin A1c 8.8.   Carotid ultrasound shows no significant ICA stenosis, occlusion of the left  vertebral artery, right vertebral flow is  antegrade.   IMPRESSION:  The patient is a 77 year old gentleman with a history of  coronary artery disease with ischemic cardiomyopathy, with implantable  cardioverter-defibrillator.  Interrogation of the implantable cardioverter-  defibrillator shows no events of ventricular tachycardia or defibrillation.  He has had two episodes of syncope, one of dizziness and numbness.  On  admission he was borderline orthostatic.  It sounds as if his episodes may  indeed be due to orthostasis, and I would agree with the fluid challenge he  has had.  I would back off some, check a BNP, and check orthostatics today  and in the morning.  Will get the patient up ambulating, see how his heart  rate responds, blood pressure responds.  No other recommendations for now.       PVR/MEDQ  D:  04/13/2005  T:  04/14/2005  Job:  [id number]

## 2011-02-10 NOTE — Cardiovascular Report (Signed)
NAME:  [name], [name]                        ACCOUNT NO.:  [id number]   MEDICAL RECORD NO.:  [id number]                   PATIENT TYPE:  INP   LOCATION:  2926                                 FACILITY:  [facility]   PHYSICIAN:  Bryson Dames, M.D.             DATE OF BIRTH:  01/03/1930   DATE OF PROCEDURE:  07/09/2003  DATE OF DISCHARGE:                              CARDIAC CATHETERIZATION   PROCEDURES PERFORMED:  1. Selective coronary angiography via Judkins technique.  2. Retrograde left heart catheterization.  3. Left ventricular angiography in two views.  4. Right heart catheterization.  5. Fick and thermodilution cardiac output determinations.   COMPLICATIONS:  None.   ENTRY SITE:  Right femoral.   DYE USED:  Omnipaque.   PATIENT PROFILE:  [name] is a 77 year old fairly active African-American  gentleman who had a presentation for chest pain on July 07, 2003.  Traditional cardiac enzymes have not been drawn such as total CK-MB, but  troponins have been negative twice.   EKG has shown sinus rhythm with first-degree block and right bundle branch  block.  Past history shows that the patient had myocardial infarction in  1990 and was said to have an ejection fraction back then of 35%.  Little is  known about the interim history between now and then except for the fact  that the patient has diabetes, treated dyslipidemia and has a history of  Hemoccult positive stool in 1996 with colonoscopy.  He also has a history of  hemorrhoids.  Outpatient medications prior to entry were Glucophage,  Glipizide, Lasix, digoxin and aspirin.   The patient enters the cath lab today for evaluation of coronary anatomy in  view of known coronary disease, old myocardial infarction and recent chest  pain apparently not related to any acute myocardial infarction.   RESULTS:   PRESSURES:  Right atrial mean pressure was 17.  Right ventricular pressure  was 99991111, end-diastolic pressure  15.  Pulmonary artery pressure was 55/32,  mean of 41.  Pulmonary capillary wedge mean pressure varied between 29 and  35.  Left ventricular pressure was 99991111, end-diastolic pressure 35.  Central aortic pressure 150/87, mean 115.  Thermodilution cardiac output  3.8.  Thermodilution cardiac index 2.1.  Fick cardiac output 3.3.  Fick  cardiac index 1.8.   ANGIOGRAPHIC RESULTS:  1. The left main coronary artery was fairly short and basically normal.  2. The left anterior descending coronary artery contains an area of proximal     90% stenosis followed by 100% total occlusion and recannulization of the     vessel just after it gives off a diagonal branch.  The mid and distal LAD     are underfilled in an antegrade fashion.  The vessel appears to be 2.0-     2.5 mm in diameter in the mid and distal portions and no additional     lesions are seen after  the proximal lesions are noted.  This vessel would     appear to be adequate for bypass grafting.  3. The first diagonal branch off the LAD arises very proximally and is 100%     occluded proximally.  There is retrograde collateral flow of this     diagonal and this diagonal is small, but potentially bypassable.  4. The left circumflex coronary artery gives rise to a bifurcating distal     obtuse marginal branch.  There is a 90% stenosis in the mid circumflex.  5. There is an intermediate ramus branch which bifurcates and is fairly     normal in appearance.  It is medium in size.  It provides collateral flow     to the diagonal branch.  6. The right coronary artery is a large and dominant vessel which gives rise     to a pulmonary conus branch which supplies collaterals to the LAD.  There     is a 75% stenosis in the proximal RCA at the junction between proximal     and mid vessel.  The distal vessel is relatively free of high grade     stenoses and gives rise to a large PDA and a bifurcating posterior     lateral branch.  There is an acute  marginal branch off the mid RCA which     contains a lesion of about 75% proximal.  7. The right subclavian artery appears patent and the left internal mammary     artery coming off the right subclavian appears to be widely patent.   Left ventricular function assessed in a 30-degree LAO projection shows left  ventricular ejection fraction estimated at 10-15% with only the anterobasal  segment moving with any force at all.  The remaining segments of the mid and  lower anterolateral wall and apex and entire inferior wall are virtually  akinetic.  No obvious LV thrombus is noted.  There is a whiff of mitral  regurgitation seen.  In an LAO projection, the findings are fairly much the  same.   FINAL IMPRESSION:  1. Ischemic cardiomyopathy of severe degree status post myocardial     infarction in 1990.     A. Left ventricular ejection fraction of 10-15%.     B. High left ventricular end-diastolic pressure and pulmonary capillary        wedge mean pressure.     C. Low cardiac output.  2. Three-vessel coronary artery disease.     A. Old recannulized occlusion of the proximal left anterior descending        with right-to-left collaterals and left-to-left collaterals to the        left anterior descending.     B. Old 100% proximal diagonal branch occlusion with left-to-left        collaterals.     C. 90% mid circumflex stenosis.     D. 75% proximal right coronary artery stenosis.     E. 75% proximal acute marginal branch stenosis of right coronary artery.  3. Moderate pulmonary hypertension likely secondary to left ventricular     failure.  4. Functional Class 1-2 symptoms with clear lung fields on chest x-ray on     admission and recent description of very active lifestyle.    PLAN:  Continue the patient's digoxin and statin agent.  Begin IV  nitroglycerin, begin heparin, begin ACE inhibitor and beta blocker.  Nuclear medicine viability study will be considered.  Cardiovascular thoracic   surgical consult  will be placed.  2-D echocardiogram will also be planned.                                                 Bryson Dames, M.D.    WHG/MEDQ  D:  07/08/2003  T:  07/09/2003  Job:  [id number]   cc:   [facility]   [facility]

## 2011-02-28 ENCOUNTER — Encounter: Payer: Self-pay | Admitting: Internal Medicine

## 2011-04-10 ENCOUNTER — Encounter: Payer: Self-pay | Admitting: Internal Medicine

## 2011-04-12 ENCOUNTER — Ambulatory Visit (INDEPENDENT_AMBULATORY_CARE_PROVIDER_SITE_OTHER): Payer: PRIVATE HEALTH INSURANCE | Admitting: Internal Medicine

## 2011-04-12 ENCOUNTER — Encounter: Payer: Self-pay | Admitting: Internal Medicine

## 2011-04-12 VITALS — BP 105/59 | HR 58 | Temp 98.0°F | Ht 71.0 in | Wt 180.6 lb

## 2011-04-12 DIAGNOSIS — E785 Hyperlipidemia, unspecified: Secondary | ICD-10-CM

## 2011-04-12 DIAGNOSIS — E119 Type 2 diabetes mellitus without complications: Secondary | ICD-10-CM

## 2011-04-12 DIAGNOSIS — I509 Heart failure, unspecified: Secondary | ICD-10-CM

## 2011-04-12 DIAGNOSIS — D509 Iron deficiency anemia, unspecified: Secondary | ICD-10-CM

## 2011-04-12 DIAGNOSIS — M25551 Pain in right hip: Secondary | ICD-10-CM | POA: Insufficient documentation

## 2011-04-12 DIAGNOSIS — N3941 Urge incontinence: Secondary | ICD-10-CM | POA: Insufficient documentation

## 2011-04-12 DIAGNOSIS — M25559 Pain in unspecified hip: Secondary | ICD-10-CM

## 2011-04-12 LAB — CBC WITH DIFFERENTIAL/PLATELET
HCT: 44.7 % (ref 39.0–52.0)
Hemoglobin: 14.9 g/dL (ref 13.0–17.0)
Lymphocytes Relative: 40 % (ref 12–46)
Lymphs Abs: 3 10*3/uL (ref 0.7–4.0)
Monocytes Absolute: 0.8 10*3/uL (ref 0.1–1.0)
Monocytes Relative: 11 % (ref 3–12)
Neutro Abs: 3.5 10*3/uL (ref 1.7–7.7)
Neutrophils Relative %: 47 % (ref 43–77)
RBC: 4.66 MIL/uL (ref 4.22–5.81)

## 2011-04-12 LAB — POCT GLYCOSYLATED HEMOGLOBIN (HGB A1C): Hemoglobin A1C: 7.2

## 2011-04-12 LAB — FERRITIN: Ferritin: 40 ng/mL (ref 22–322)

## 2011-04-12 LAB — LIPID PANEL
Cholesterol: 167 mg/dL (ref 0–200)
LDL Cholesterol: 80 mg/dL (ref 0–99)
VLDL: 32 mg/dL (ref 0–40)

## 2011-04-12 MED ORDER — GLUCOSE BLOOD VI STRP: ORAL_STRIP | Status: AC

## 2011-04-12 MED ORDER — ACCU-CHEK AVIVA PLUS W/DEVICE KIT: 1.0000 | PACK | Status: DC

## 2011-04-12 MED ORDER — ACCU-CHEK MULTICLIX LANCETS MISC: Status: AC

## 2011-04-12 NOTE — Progress Notes (Signed)
  Subjective:    Patient ID: [name], male    DOB: 01-24-1931, 77 y.o.   MRN: [id number]  HPI Patient returns for follow up of his diabetes mellitus, congestive heart failure, hyperlipidemia, and other chronic medical problems.  His main complaint today is chronic right hip pain which has been present for years but it which has been somewhat worse over the past few weeks; he reports that he is taking an occasional 200 mg ibuprofen with good relief, and says that he takes no more than one tablet every 3 days or so.  Patient also reports episodes of urinary urgency with incontinence if he does not make it to the bathroom immediately.  He has no other acute complaints.     Review of Systems  Constitutional: Negative for fever, chills and diaphoresis.  Respiratory: Negative for cough and shortness of breath (No orthopnea or PND).   Cardiovascular: Negative for chest pain and leg swelling.  Gastrointestinal: Negative for nausea, vomiting, abdominal pain and blood in stool.  Genitourinary: Positive for urgency. Negative for dysuria.  Musculoskeletal: Positive for arthralgias (Right hip pain.).       Objective:   Physical Exam  Constitutional: No distress.  Cardiovascular: Normal rate, regular rhythm, S1 normal and S2 normal.  Exam reveals no S3 and no S4.   Murmur heard.  Systolic murmur is present with a grade of 2/6  Pulmonary/Chest: Effort normal and breath sounds normal. He has no wheezes. He has no rales.  Abdominal: Soft. Bowel sounds are normal. He exhibits no distension. There is no tenderness. There is no rebound and no guarding.  Musculoskeletal: He exhibits no edema.          Assessment & Plan:

## 2011-04-12 NOTE — Assessment & Plan Note (Signed)
Patient has a history of right hip osteoarthritis documented by x-ray in 2004.  The plan is to repeat x-rays of the hip.  His pain is currently well controlled on a minimal dose of ibuprofen; I counseled him not to increase the quantity of ibuprofen and to let me know if he has any worsening of his pain.

## 2011-04-12 NOTE — Assessment & Plan Note (Signed)
Lab Results  Component Value Date   HGBA1C 7.2 04/12/2011   HGBA1C 6.4 07/13/2010   CREATININE 1.42 12/27/2010   CREATININE 1.42 05/21/2010   MICROALBUR 10.32* 03/10/2009   MICRALBCREAT 38.5* 03/10/2009   CHOL 149 11/10/2009   HDL 55 11/10/2009   TRIG 62 11/10/2009    Assessment: Diabetes control: A1C is slightly above target of 7. Progress toward goals: unchanged Barriers to meeting goals: no barriers identified  Plan: Diabetes treatment: Increase Lantus insulin to a dose of 32 units daily. Refer to: diabetes educator for self-management training Instruction/counseling given: reminded to get eye exam, reminded to bring blood glucose meter & log to each visit and reminded to bring medications to each visit

## 2011-04-12 NOTE — Assessment & Plan Note (Signed)
Plan is to check a CBC today.

## 2011-04-12 NOTE — Patient Instructions (Signed)
Please increase Lantus insulin to 32 units daily. Please return this week for X-rays of right hip. Please keep appointment with urologist.

## 2011-04-12 NOTE — Assessment & Plan Note (Signed)
Patient has no symptoms on current regimen.  Plan is to continue current medications.

## 2011-04-12 NOTE — Assessment & Plan Note (Signed)
Patient reports urge urinary incontinence.  Plan is to refer to urology for evaluation.

## 2011-04-12 NOTE — Assessment & Plan Note (Addendum)
Lab results:    Component Value Date/Time   CHOL 149 11/10/2009 1814   TRIG 62 11/10/2009 1814   HDL 55 11/10/2009 1814   LDLCALC 82 11/10/2009 1814   VLDL 12 11/10/2009 1814   CHOLHDL 2.7 Ratio 11/10/2009 1814    Assessment: Patient is doing well on simvastatin with no apparent side effects.  Plan: Plan is to continue simvastatin and check a lipid panel as well as a metabolic panel today.

## 2011-04-13 LAB — COMPLETE METABOLIC PANEL WITH GFR
ALT: 17 U/L (ref 0–53)
AST: 28 U/L (ref 0–37)
Albumin: 4.1 g/dL (ref 3.5–5.2)
BUN: 28 mg/dL — ABNORMAL HIGH (ref 6–23)
CO2: 20 mEq/L (ref 19–32)
Calcium: 10.2 mg/dL (ref 8.4–10.5)
Chloride: 104 mEq/L (ref 96–112)
Creat: 1.59 mg/dL — ABNORMAL HIGH (ref 0.50–1.35)
GFR, Est African American: 51 mL/min — ABNORMAL LOW (ref >60)
Potassium: 4.5 mEq/L (ref 3.5–5.3)

## 2011-05-11 ENCOUNTER — Encounter: Payer: PRIVATE HEALTH INSURANCE | Admitting: *Deleted

## 2011-06-15 LAB — DIFFERENTIAL
Eosinophils Relative: 2
Lymphocytes Relative: 31
Lymphs Abs: 2.1
Monocytes Absolute: 1.1 — ABNORMAL HIGH

## 2011-06-15 LAB — CBC
HCT: 47.6
Hemoglobin: 16
WBC: 6.7

## 2011-06-15 LAB — BASIC METABOLIC PANEL
GFR calc non Af Amer: 46 — ABNORMAL LOW
Potassium: 3.6
Sodium: 136

## 2011-06-22 ENCOUNTER — Encounter: Payer: Self-pay | Admitting: Internal Medicine

## 2011-06-22 DIAGNOSIS — R972 Elevated prostate specific antigen [PSA]: Secondary | ICD-10-CM | POA: Insufficient documentation

## 2011-06-22 NOTE — Progress Notes (Signed)
I received a copy of an office visit and labs from Dr. Karsten Ro at [facility] from an office visit on 04/27/2011; at that time patient was found to have a staph UTI and Dr. Karsten Ro had started antibiotic treatment.  His PSA was 59.41.  Plan is to determine whether patient had followup PSA done and whether further workup was done by Dr. Karsten Ro.  Will also schedule followup here in our clinic.

## 2011-06-23 LAB — GLUCOSE, CAPILLARY
Glucose-Capillary: 124 — ABNORMAL HIGH
Glucose-Capillary: 135 — ABNORMAL HIGH
Glucose-Capillary: 140 — ABNORMAL HIGH
Glucose-Capillary: 161 — ABNORMAL HIGH
Glucose-Capillary: 162 — ABNORMAL HIGH
Glucose-Capillary: 229 — ABNORMAL HIGH

## 2011-06-23 LAB — CARDIAC PANEL(CRET KIN+CKTOT+MB+TROPI)
CK, MB: 2
CK, MB: 2.3
Relative Index: 1.8
Relative Index: INVALID
Total CK: 131
Total CK: 95
Troponin I: 0.01
Troponin I: 0.03

## 2011-06-23 LAB — CBC
HCT: 39.1
HCT: 42.5
MCV: 94.8
MCV: 96.7
Platelets: 140 — ABNORMAL LOW
Platelets: 154
RDW: 14.2
RDW: 14.5
WBC: 7.2

## 2011-06-23 LAB — COMPREHENSIVE METABOLIC PANEL
ALT: 14
AST: 22
Albumin: 3.7
Alkaline Phosphatase: 69
BUN: 32 — ABNORMAL HIGH
CO2: 25
Calcium: 9.7
Chloride: 105
Creatinine, Ser: 1.54 — ABNORMAL HIGH
GFR calc Af Amer: 53 — ABNORMAL LOW
GFR calc non Af Amer: 44 — ABNORMAL LOW
Glucose, Bld: 155 — ABNORMAL HIGH
Potassium: 5.7 — ABNORMAL HIGH
Sodium: 135
Total Bilirubin: 0.4
Total Protein: 6.9

## 2011-06-23 LAB — BASIC METABOLIC PANEL
BUN: 19
BUN: 24 — ABNORMAL HIGH
BUN: 29 — ABNORMAL HIGH
CO2: 25
Calcium: 9.3
Chloride: 104
Chloride: 105
Creatinine, Ser: 1.39
Creatinine, Ser: 1.4
GFR calc Af Amer: 60 — ABNORMAL LOW
GFR calc non Af Amer: 46 — ABNORMAL LOW
GFR calc non Af Amer: 49 — ABNORMAL LOW
GFR calc non Af Amer: 50 — ABNORMAL LOW
Glucose, Bld: 144 — ABNORMAL HIGH
Glucose, Bld: 213 — ABNORMAL HIGH
Potassium: 4.9
Potassium: 5.1
Potassium: 5.5 — ABNORMAL HIGH
Sodium: 133 — ABNORMAL LOW
Sodium: 135

## 2011-06-23 LAB — PROTIME-INR: Prothrombin Time: 14.6

## 2011-06-23 LAB — APTT: aPTT: 34

## 2011-06-29 LAB — POCT I-STAT, CHEM 8
Calcium, Ion: 1.32 mmol/L (ref 1.12–1.32)
Chloride: 105 mEq/L (ref 96–112)
Glucose, Bld: 204 mg/dL — ABNORMAL HIGH (ref 70–99)
HCT: 49 % (ref 39.0–52.0)
Hemoglobin: 16.7 g/dL (ref 13.0–17.0)
Potassium: 5.7 mEq/L — ABNORMAL HIGH (ref 3.5–5.1)

## 2011-06-29 LAB — POTASSIUM: Potassium: 5.6 mEq/L — ABNORMAL HIGH (ref 3.5–5.1)

## 2011-08-03 ENCOUNTER — Telehealth: Payer: Self-pay | Admitting: *Deleted

## 2011-08-03 NOTE — Telephone Encounter (Signed)
Returned call from [facility]. Nurse states Dr Karsten Ro is aware of elevated PSA.  They have tried to reach pt and he has not responded.  They will try again.  I called pt to make appointment in clinic and had to leave a message.  Will wait for return call.

## 2011-08-10 NOTE — Telephone Encounter (Signed)
Pt called again and message left for him to call clinic and make appointment.

## 2011-08-21 ENCOUNTER — Encounter: Payer: Self-pay | Admitting: *Deleted

## 2011-08-21 NOTE — Telephone Encounter (Signed)
Letter has been mailed.

## 2011-08-21 NOTE — Telephone Encounter (Signed)
If patient has not returned call, would send a letter.

## 2011-08-29 ENCOUNTER — Emergency Department (INDEPENDENT_AMBULATORY_CARE_PROVIDER_SITE_OTHER)
Admission: EM | Admit: 2011-08-29 | Discharge: 2011-08-29 | Disposition: A | Discharge status: Home / Self Care | Payer: PRIVATE HEALTH INSURANCE | Source: Home / Self Care | Attending: Emergency Medicine | Admitting: Emergency Medicine

## 2011-08-29 ENCOUNTER — Encounter (HOSPITAL_COMMUNITY): Payer: Self-pay | Admitting: *Deleted

## 2011-08-29 DIAGNOSIS — S139XXA Sprain of joints and ligaments of unspecified parts of neck, initial encounter: Secondary | ICD-10-CM

## 2011-08-29 DIAGNOSIS — S161XXA Strain of muscle, fascia and tendon at neck level, initial encounter: Secondary | ICD-10-CM

## 2011-08-29 MED ORDER — CYCLOBENZAPRINE HCL 5 MG PO TABS: 5.0000 mg | ORAL_TABLET | Freq: Three times a day (TID) | ORAL | Status: AC | PRN

## 2011-08-29 NOTE — ED Notes (Signed)
Pt  Reports  Neck pain  X  2  Days  denys       Any  Known       Injury      He     denys  Any  Chest   Pain         Pt     Had        Carotid  Surgery       8  Years  Ago

## 2011-08-29 NOTE — ED Provider Notes (Signed)
History     CSN: [id number] Arrival date & time: 08/29/2011  5:52 PM   First MD Initiated Contact with Patient 08/29/11 1624      Chief Complaint  Patient presents with  . Neck Pain    (Consider location/radiation/quality/duration/timing/severity/associated sxs/prior treatment) HPI Comments: Mr. Pontarelli woke up this morning with pain in the right side of his neck without radiation. It hurts to move it. It feels like a stinging or tingling, and is rated 5/10 in intensity. He denies any injury to the neck. He's had no headache, no blurry vision, he does feel little bit dizzy, no sore throat or fever. He denies shortness of breath or chest pain. There is no radiation down the arm, no numbness, tingling, or weakness in the arm.  Patient is a 77 y.o. male presenting with neck pain.  Neck Pain  Pertinent negatives include no chest pain, no numbness, no headaches and no weakness.    Past Medical History  Diagnosis Date  . Anemia   . Cataracts, bilateral   . Hx of colonic polyps   . CHF (congestive heart failure)     ischemic CM EF 15-20% s/p AICD 05/24/04  . CAD (coronary artery disease)     s/p AMI, s/p PTCA & stent of cx 12/04 restent 5/05  . Diabetes mellitus   . Diverticulosis of colon   . Hyperlipidemia   . PVD (peripheral vascular disease)     s/p L carotid PTCA/stent 2004  . Transient ischemic attack   . Erectile dysfunction   . Hyperkalemia 08/2008    K=5.7     History reviewed. No pertinent past surgical history.  Family History  Problem Relation Age of Onset  . Diabetes Mother   . Diabetes Brother     History  Substance Use Topics  . Smoking status: Former Smoker    Types: Cigarettes    Quit date: 12/27/1967  . Smokeless tobacco: Never Used  . Alcohol Use: No      Review of Systems  HENT: Positive for neck pain.   Respiratory: Negative for cough and shortness of breath.   Cardiovascular: Negative for chest pain.  Musculoskeletal: Negative for myalgias,  back pain and arthralgias.  Neurological: Negative for weakness, numbness and headaches.    Allergies  Review of patient's allergies indicates no known allergies.  Home Medications   Current Outpatient Rx  Name Route Sig Dispense Refill  . ASPIRIN 81 MG PO TBEC Oral Take 81 mg by mouth daily.      Marland Kitchen ACCU-CHEK AVIVA PLUS W/DEVICE KIT Does not apply 1 kit by Does not apply route as directed. 1 kit 0  . CARVEDILOL 25 MG PO TABS Oral Take 1 tablet (25 mg total) by mouth 2 (two) times daily with a meal. 60 tablet 12  . CYCLOBENZAPRINE HCL 5 MG PO TABS Oral Take 1 tablet (5 mg total) by mouth 3 (three) times daily as needed for muscle spasms. 30 tablet 0  . ASPIRIN-DIPYRIDAMOLE 25-200 MG PO CP12 Oral Take 1 capsule by mouth 2 (two) times daily. 60 capsule 12  . GLUCOSE BLOOD VI STRP  Use as instructed to check blood sugar twice a day.  Diagnosis ICD-9 250.00. 100 each 12  . INSULIN GLARGINE 100 UNIT/ML [place] SOLN       . INSULIN PEN NEEDLE 31G X 6 MM MISC  Use as directed. 100 each 3  . ACCU-CHEK MULTICLIX LANCETS MISC  Use as instructed to check blood sugar twice a day.  Diagnosis ICD-9 250.00. 100 each 12  . PANTOPRAZOLE SODIUM 40 MG PO TBEC Oral Take 1 tablet (40 mg total) by mouth daily. 31 tablet 12    Must have appointment ASAP  . SIMVASTATIN 40 MG PO TABS Oral Take 1 tablet (40 mg total) by mouth daily. 31 tablet 12    Must have appointment ASAP    BP 135/68  Pulse 62  Temp(Src) 98.2 F (36.8 C) (Oral)  Resp 16  SpO2 100%  Physical Exam  Nursing note and vitals reviewed. Constitutional: He is oriented to person, place, and time. He appears well-developed and well-nourished. No distress.  Neck: Normal range of motion. Neck supple.  Cardiovascular: Normal rate, regular rhythm, normal heart sounds and intact distal pulses.  Exam reveals no gallop and no friction rub.   No murmur heard. Pulmonary/Chest: Effort normal and breath sounds normal. No respiratory distress. He has no  wheezes. He has no rales.  Musculoskeletal: He exhibits tenderness. He exhibits no edema.       Cervical back: He exhibits decreased range of motion, tenderness and pain. He exhibits no bony tenderness, no swelling, no edema, no deformity, no spasm and normal pulse.  Neurological: He is alert and oriented to person, place, and time. He has normal strength. He displays no atrophy and normal reflexes. No cranial nerve deficit or sensory deficit. He exhibits normal muscle tone. Coordination normal.  Skin: He is not diaphoretic.    ED Course  Procedures (including critical care time)  Labs Reviewed - No data to display No results found.   1. Cervical strain       MDM          Birdena Crandall, MD 08/29/11 [phone]

## 2011-09-01 ENCOUNTER — Other Ambulatory Visit: Payer: Self-pay | Admitting: Internal Medicine

## 2012-01-02 ENCOUNTER — Other Ambulatory Visit: Payer: Self-pay | Admitting: Internal Medicine

## 2012-01-15 ENCOUNTER — Other Ambulatory Visit: Payer: Self-pay | Admitting: *Deleted

## 2012-01-15 MED ORDER — PANTOPRAZOLE SODIUM 40 MG PO TBEC: 40.0000 mg | DELAYED_RELEASE_TABLET | Freq: Every day | ORAL | Status: DC

## 2012-01-15 MED ORDER — SIMVASTATIN 40 MG PO TABS: 40.0000 mg | ORAL_TABLET | Freq: Every day | ORAL | Status: DC

## 2012-01-15 MED ORDER — ASPIRIN-DIPYRIDAMOLE ER 25-200 MG PO CP12: 1.0000 | ORAL_CAPSULE | Freq: Two times a day (BID) | ORAL | Status: DC

## 2012-01-16 ENCOUNTER — Encounter: Payer: Self-pay | Admitting: Internal Medicine

## 2012-01-16 ENCOUNTER — Ambulatory Visit (INDEPENDENT_AMBULATORY_CARE_PROVIDER_SITE_OTHER): Payer: PRIVATE HEALTH INSURANCE | Admitting: Internal Medicine

## 2012-01-16 VITALS — BP 110/87 | HR 58 | Temp 96.0°F | Ht 70.5 in | Wt 182.5 lb

## 2012-01-16 VITALS — BP 120/70 | HR 74 | Ht 70.0 in | Wt 184.8 lb

## 2012-01-16 DIAGNOSIS — I2510 Atherosclerotic heart disease of native coronary artery without angina pectoris: Secondary | ICD-10-CM

## 2012-01-16 DIAGNOSIS — Z79899 Other long term (current) drug therapy: Secondary | ICD-10-CM

## 2012-01-16 DIAGNOSIS — I428 Other cardiomyopathies: Secondary | ICD-10-CM | POA: Insufficient documentation

## 2012-01-16 DIAGNOSIS — E119 Type 2 diabetes mellitus without complications: Secondary | ICD-10-CM

## 2012-01-16 DIAGNOSIS — I2589 Other forms of chronic ischemic heart disease: Secondary | ICD-10-CM

## 2012-01-16 DIAGNOSIS — I509 Heart failure, unspecified: Secondary | ICD-10-CM

## 2012-01-16 DIAGNOSIS — E785 Hyperlipidemia, unspecified: Secondary | ICD-10-CM

## 2012-01-16 DIAGNOSIS — Z95810 Presence of automatic (implantable) cardiac defibrillator: Secondary | ICD-10-CM

## 2012-01-16 LAB — ICD DEVICE OBSERVATION
BATTERY VOLTAGE: 2.57 V
CHARGE TIME: 10.99 s
FVT: 0
RV LEAD IMPEDENCE ICD: 481 Ohm
TZAT-0004FASTVT: 8
TZAT-0004SLOWVT: 8
TZAT-0004SLOWVT: 8
TZAT-0005FASTVT: 88 pct
TZAT-0011FASTVT: 10 ms
TZAT-0011SLOWVT: 10 ms
TZAT-0011SLOWVT: 10 ms
TZAT-0012FASTVT: 200 ms
TZAT-0012SLOWVT: 200 ms
TZAT-0012SLOWVT: 200 ms
TZAT-0020FASTVT: 1.6 ms
TZON-0003FASTVT: 250 ms
TZON-0005SLOWVT: 12
TZON-0010AFLUTTER: 30 ms
TZON-0010VSLOWVT: 30 ms
TZST-0001FASTVT: 4
TZST-0001FASTVT: 5
TZST-0001SLOWVT: 4
TZST-0003FASTVT: 30 J
TZST-0003FASTVT: 30 J
TZST-0003FASTVT: 30 J
TZST-0003SLOWVT: 12 J
TZST-0003SLOWVT: 30 J
VF: 0

## 2012-01-16 LAB — BASIC METABOLIC PANEL
CO2: 26 mEq/L (ref 19–32)
Calcium: 9.4 mg/dL (ref 8.4–10.5)
Chloride: 105 mEq/L (ref 96–112)
Creat: 1.29 mg/dL (ref 0.50–1.35)
Glucose, Bld: 88 mg/dL (ref 70–99)
Sodium: 137 mEq/L (ref 135–145)

## 2012-01-16 MED ORDER — SIMVASTATIN 40 MG PO TABS: 40.0000 mg | ORAL_TABLET | Freq: Every day | ORAL | Status: DC

## 2012-01-16 MED ORDER — ASPIRIN-DIPYRIDAMOLE ER 25-200 MG PO CP12: 1.0000 | ORAL_CAPSULE | Freq: Two times a day (BID) | ORAL | Status: DC

## 2012-01-16 MED ORDER — PANTOPRAZOLE SODIUM 40 MG PO TBEC: 40.0000 mg | DELAYED_RELEASE_TABLET | Freq: Every day | ORAL | Status: DC

## 2012-01-16 MED ORDER — INSULIN GLARGINE 100 UNIT/ML ~~LOC~~ SOLN: 32.0000 [IU] | Freq: Every day | SUBCUTANEOUS | Status: DC

## 2012-01-16 MED ORDER — CARVEDILOL 25 MG PO TABS: 25.0000 mg | ORAL_TABLET | Freq: Two times a day (BID) | ORAL | Status: DC

## 2012-01-16 NOTE — Patient Instructions (Addendum)
Your physician recommends that you schedule a follow-up appointment in: 8 weeks in the device clinic

## 2012-01-16 NOTE — Assessment & Plan Note (Signed)
At goal as of 7/12 Continue statin

## 2012-01-16 NOTE — Assessment & Plan Note (Signed)
He denies anginal symptoms. He will continue his current medical therapy. 

## 2012-01-16 NOTE — Assessment & Plan Note (Signed)
His chronic systolic heart failure is well controlled. He will continue his current medical therapy and maintain a low-sodium diet.

## 2012-01-16 NOTE — Progress Notes (Signed)
HPI Mr. Soscia returns today for followup. He is a very is a 78 year old man with an ischemic cardio myopathy, chronic class II congestive heart failure, ventricular tachycardia, status post ICD implantation. The patient continues to do well. He denies chest pain or shortness of breath. He is approaching elective replacement on his ICD. He denies any recent ICD shocks. No Known Allergies   Current Outpatient Prescriptions  Medication Sig Dispense Refill  . Blood Glucose Monitoring Suppl (ACCU-CHEK AVIVA PLUS) W/DEVICE KIT 1 kit by Does not apply route as directed.  1 kit  0  . carvedilol (COREG) 25 MG tablet TAKE ONE (1) TABLET(S) TWICE DAILY WITH MEALS  60 tablet  5  . dipyridamole-aspirin (AGGRENOX) 25-200 MG per 12 hr capsule Take 1 capsule by mouth 2 (two) times daily.  60 capsule  2  . glucose blood (ACCU-CHEK AVIVA PLUS) test strip Use as instructed to check blood sugar twice a day.  Diagnosis ICD-9 250.00.  100 each  12  . insulin glargine (LANTUS SOLOSTAR) 100 UNIT/ML injection Inject 32 Units into the skin daily.  15 mL  3  . Insulin Pen Needle 31G X 6 MM MISC Use as directed.  100 each  3  . Lancets (ACCU-CHEK MULTICLIX) lancets Use as instructed to check blood sugar twice a day.  Diagnosis ICD-9 250.00.  100 each  12  . pantoprazole (PROTONIX) 40 MG tablet Take 1 tablet (40 mg total) by mouth daily.  31 tablet  2  . simvastatin (ZOCOR) 40 MG tablet Take 1 tablet (40 mg total) by mouth daily.  31 tablet  2     Past Medical History  Diagnosis Date  . Anemia   . Cataracts, bilateral   . Hx of colonic polyps   . CHF (congestive heart failure)     ischemic CM EF 15-20% s/p AICD 05/24/04  . CAD (coronary artery disease)     s/p AMI, s/p PTCA & stent of cx 12/04 restent 5/05  . Diabetes mellitus     type 2  . Diverticulosis of colon   . Hyperlipidemia   . PVD (peripheral vascular disease)     s/p L carotid PTCA/stent 2004  . Transient ischemic attack   . Erectile dysfunction     . Hyperkalemia 08/2008    K=5.7   . BBB (bundle branch block)     right  . HTN (hypertension)     ROS:   All systems reviewed and negative except as noted in the HPI.   Past Surgical History  Procedure Date  . Cardiac catheterization 06/2003,  01/2004     Family History  Problem Relation Age of Onset  . Diabetes Mother   . Diabetes Brother      History   Social History  . Marital Status: Widowed    Spouse Name: N/A    Number of Children: N/A  . Years of Education: N/A   Occupational History  . Not on file.   Social History Main Topics  . Smoking status: Former Smoker    Types: Cigarettes    Quit date: 12/27/1967  . Smokeless tobacco: Never Used  . Alcohol Use: No  . Drug Use: No  . Sexually Active: Not on file   Other Topics Concern  . Not on file   Social History Narrative   Single, 2 adult children, daughter in [place], son in [place]     BP 120/70  Pulse 74  Ht 5\' 10"  (1.778 m)  Wt  184 lb 12.8 oz (83.825 kg)  BMI 26.52 kg/m2  Physical Exam:  Well appearing 78 year old man, NAD HEENT: Unremarkable Neck:  No JVD, no thyromegally Lungs:  Clear with no wheezes, rales, or rhonchi. HEART:  Regular rate rhythm, no murmurs, no rubs, no clicks, PMI is increased. Abd:  soft, positive bowel sounds, no organomegally, no rebound, no guarding Ext:  2 plus pulses, no edema, no cyanosis, no clubbing Skin:  No rashes no nodules Neuro:  CN II through XII intact, motor grossly intact  DEVICE  Normal device function.  See PaceArt for details.   Assess/Plan:

## 2012-01-16 NOTE — Assessment & Plan Note (Signed)
His device is working normally but is nearing elective replacement. We'll plan to recheck in several months.

## 2012-01-16 NOTE — Patient Instructions (Signed)
Bring your meter at next visit

## 2012-01-16 NOTE — Progress Notes (Signed)
Patient ID: [name], male   DOB: May 06, 1931, 78 y.o.   MRN: [id number]  78 Y/o m with pmh listed below comes for checkup No complaints today Complaint with medications No medications side effect Uptodate on refills See individual a/p for further details.   Physical exam  General Appearance:     Filed Vitals:   01/16/12 1510  BP: 144/87  Pulse: 58  Temp: 96 F (35.6 C)  TempSrc: Oral  Height: 5' 10.5" (1.791 m)  Weight: 182 lb 8 oz (82.781 kg)  SpO2: 100%     Alert, cooperative, no distress, appears stated age  Head:    Normocephalic, without obvious abnormality, atraumatic  Eyes:    PERRL, conjunctiva/corneas clear, EOM's intact, fundi    benign, both eyes       Neck:   Supple, symmetrical, trachea midline, no adenopathy;       thyroid:  No enlargement/tenderness/nodules; no carotid   bruit or JVD  Lungs:     Clear to auscultation bilaterally, respirations unlabored  Chest wall:    No tenderness or deformity  Heart:    Regular rate and rhythm, S1 and S2 normal, no murmur, rub   or gallop  Abdomen:     Soft, non-tender, bowel sounds active all four quadrants,    no masses, no organomegaly  Extremities:   Extremities normal, atraumatic, no cyanosis or edema  Pulses:   2+ and symmetric all extremities  Skin:   Skin color, texture, turgor normal, no rashes or lesions  Neurologic:  nonfocal grossly    ROS  Constitutional: Denies fever, chills, diaphoresis, appetite change and fatigue.  Respiratory: Denies SOB, DOE, cough, chest tightness,  and wheezing.   Cardiovascular: Denies chest pain, palpitations and leg swelling.  Gastrointestinal: Denies nausea, vomiting, abdominal pain, diarrhea, constipation, blood in stool and abdominal distention.  Skin: Denies pallor, rash and wound.  Neurological: Denies dizziness, light-headedness, numbness and headaches.

## 2012-01-16 NOTE — Assessment & Plan Note (Addendum)
A1c getting out of control on current lantus dose He says he check CBG every day and its always <150 which does not match with with his A1c >8. Will ask him to come back in 1 month and bring his meter at that time so we can change his lantus dose as needed. Also may add metformin per pcp Foot exam done today Not on ACEi, will check microalbumin/creatinine Uptodate on eye exam Diet and exercise advise provided

## 2012-01-16 NOTE — Assessment & Plan Note (Signed)
No anginal symptoms Continue medical Mx Follows Dr. Lovena Le (seen this month)

## 2012-02-14 ENCOUNTER — Ambulatory Visit (INDEPENDENT_AMBULATORY_CARE_PROVIDER_SITE_OTHER): Payer: PRIVATE HEALTH INSURANCE | Admitting: Internal Medicine

## 2012-02-14 ENCOUNTER — Encounter: Payer: Self-pay | Admitting: Internal Medicine

## 2012-02-14 VITALS — BP 112/60 | HR 63 | Temp 97.5°F | Ht 70.5 in | Wt 179.7 lb

## 2012-02-14 DIAGNOSIS — I2510 Atherosclerotic heart disease of native coronary artery without angina pectoris: Secondary | ICD-10-CM

## 2012-02-14 DIAGNOSIS — I509 Heart failure, unspecified: Secondary | ICD-10-CM

## 2012-02-14 DIAGNOSIS — E785 Hyperlipidemia, unspecified: Secondary | ICD-10-CM

## 2012-02-14 DIAGNOSIS — E119 Type 2 diabetes mellitus without complications: Secondary | ICD-10-CM

## 2012-02-14 DIAGNOSIS — J309 Allergic rhinitis, unspecified: Secondary | ICD-10-CM

## 2012-02-14 DIAGNOSIS — R972 Elevated prostate specific antigen [PSA]: Secondary | ICD-10-CM

## 2012-02-14 DIAGNOSIS — I6529 Occlusion and stenosis of unspecified carotid artery: Secondary | ICD-10-CM

## 2012-02-14 DIAGNOSIS — D126 Benign neoplasm of colon, unspecified: Secondary | ICD-10-CM | POA: Insufficient documentation

## 2012-02-14 DIAGNOSIS — J302 Other seasonal allergic rhinitis: Secondary | ICD-10-CM | POA: Insufficient documentation

## 2012-02-14 HISTORY — DX: Benign neoplasm of colon, unspecified: D12.6

## 2012-02-14 LAB — COMPLETE METABOLIC PANEL WITH GFR
ALT: 12 U/L (ref 0–53)
AST: 21 U/L (ref 0–37)
CO2: 27 mEq/L (ref 19–32)
Creat: 1.51 mg/dL — ABNORMAL HIGH (ref 0.50–1.35)
GFR, Est African American: 50 mL/min — ABNORMAL LOW
Total Bilirubin: 0.5 mg/dL (ref 0.3–1.2)

## 2012-02-14 LAB — CBC WITH DIFFERENTIAL/PLATELET
Basophils Absolute: 0 10*3/uL (ref 0.0–0.1)
Basophils Relative: 0 % (ref 0–1)
Eosinophils Absolute: 0.2 10*3/uL (ref 0.0–0.7)
Eosinophils Relative: 2 % (ref 0–5)
HCT: 43.5 % (ref 39.0–52.0)
MCHC: 32.6 g/dL (ref 30.0–36.0)
MCV: 93.5 fL (ref 78.0–100.0)
Monocytes Absolute: 0.7 10*3/uL (ref 0.1–1.0)
Neutro Abs: 3.7 10*3/uL (ref 1.7–7.7)
RDW: 14.3 % (ref 11.5–15.5)

## 2012-02-14 LAB — LIPID PANEL
HDL: 51 mg/dL (ref >39)
LDL Cholesterol: 83 mg/dL (ref 0–99)
Triglycerides: 77 mg/dL (ref <150)

## 2012-02-14 LAB — GLUCOSE, CAPILLARY: Glucose-Capillary: 84 mg/dL (ref 70–99)

## 2012-02-14 MED ORDER — MOMETASONE FUROATE 50 MCG/ACT NA SUSP: 2.0000 | Freq: Every day | NASAL | Status: DC

## 2012-02-14 NOTE — Assessment & Plan Note (Signed)
Assessment: Patient has seasonal allergic rhinitis, with prominent symptoms of nasal congestion during pollen season.  Plan: Plan is Nasonex nasal spray as needed during pollen season.

## 2012-02-14 NOTE — Assessment & Plan Note (Signed)
Assessment: Patient has no anginal symptoms.  Plan: Continue current regimen; patient will follow up with Dr. Lovena Le next month as scheduled

## 2012-02-14 NOTE — Patient Instructions (Addendum)
Please check blood sugar twice a day and bring the glucose meter and log to clinic for review. Start Nasonex nasal spray 2 sprays in each nostril once daily as needed for nasal congestion associated with seasonal allergies.

## 2012-02-14 NOTE — Assessment & Plan Note (Signed)
Assessment: Patient had an elevated PSA of 59.41 done by patient's urologist Dr. Karsten Ro on 04/27/2011; this was in the setting of a staph UTI treated by [facility].  Patient apparently did not followup with Dr. Karsten Ro.    Plan: Plan is to check a PSA today.  I discussed this at length with patient, and explained that if his PSA is still elevated, then he will need to see Dr. Karsten Ro right away and will need to be worked up for possible prostate cancer.  Patient voiced his understanding.

## 2012-02-14 NOTE — Assessment & Plan Note (Signed)
Assessment: Patient reports no symptoms of congestive heart failure.  He has trace ankle edema bilaterally on exam, which he had not noticed.  He is currently not on a diuretic or ACE inhibitor.  Plan: I discussed the option of adding a low dose of furosemide with patient.  Given his absence of symptoms and minimal ankle edema, I will not change his regimen at this time.  I advised him to let us know right away if he should develop any increased lower extremity edema, or if he has any symptoms of dyspnea, orthopnea, or PND.  He will followup with Dr. Lovena Le in June as scheduled.

## 2012-02-14 NOTE — Assessment & Plan Note (Signed)
Assessment: Patient is S/P percutaneous transluminal angioplasty and stent placement to the left internal carotid artery 09/11/2003  by Dr. Christy Sartorius and Dr. Drucie Opitz.  He is currently on Aggrenox, and he has no neurologic symptoms.  Plan: Continue Aggrenox and treatment of risk factors.

## 2012-02-14 NOTE — Assessment & Plan Note (Signed)
Lipids:    Component Value Date/Time   CHOL 167 04/12/2011 1103   TRIG 161* 04/12/2011 1103   HDL 55 04/12/2011 1103   LDLCALC 80 04/12/2011 1103   VLDL 32 04/12/2011 1103   CHOLHDL 3.0 04/12/2011 1103    Assessment: Patient is doing well on current dose of simvastatin without apparent side effects.  Plan: Check a lipid panel today (patient reports that he is fasting); continue simvastatin 40 mg daily pending the results of lipid panel.

## 2012-02-14 NOTE — Progress Notes (Signed)
  Subjective:    Patient ID: [name], male    DOB: Jan 24, 1931, 78 y.o.   MRN: [id number]  HPI Patient returns for followup of his diabetes mellitus, hyperlipidemia, and other chronic medical problems.  His main complaint is nasal stuffiness which occurs during pollen season; he is currently on no treatment for seasonal allergies. Otherwise he has no complaint today, and reports that he has been doing well.  He has not been checking his blood sugars at home because he cannot afford testing strips.  He brought his medications to clinic, and he reports that he is compliant with his medications.  He has seen his cardiologist for a pacemaker check, and has an appointment with Dr. Lovena Le in June.  He reports that he is active, and participating in a bowling league.  Regarding his diet, he reports that he does not eat concentrated sweets.  He denies any chest pain, shortness of breath, orthopnea, PND, or lower extremity edema.   Review of Systems  Constitutional: Negative for activity change, appetite change and unexpected weight change.  Respiratory: Negative for chest tightness, shortness of breath and wheezing.   Cardiovascular: Negative for chest pain, palpitations and leg swelling.  Gastrointestinal: Negative for nausea, vomiting, abdominal pain and blood in stool.  Genitourinary: Negative for dysuria and difficulty urinating.  Musculoskeletal: Negative for myalgias, back pain and arthralgias (Patient reports that the hip pain noted last year has resolved completely).  Neurological: Negative for speech difficulty, weakness and numbness.       Objective:   Physical Exam  Constitutional: No distress.  Cardiovascular: Normal rate and regular rhythm.  Exam reveals no gallop and no friction rub.   No murmur heard. Pulmonary/Chest: Effort normal and breath sounds normal. No respiratory distress. He has no wheezes. He has no rales.  Abdominal: Soft. Bowel sounds are normal. He exhibits no  distension. There is no tenderness. There is no guarding.  Musculoskeletal: He exhibits edema (Trace bilateral ankle edema.).       Assessment & Plan:

## 2012-02-14 NOTE — Assessment & Plan Note (Signed)
Lab Results  Component Value Date   HGBA1C 8.6 01/16/2012   CREATININE 1.29 01/16/2012   MICROALBUR 5.99* 01/16/2012   MICRALBCREAT 33.3* 01/16/2012    Assessment: Diabetes control: not controlled Progress toward goals: unable to assess Barriers to meeting goals: financial need (has difficulty affording afford test strips)  Plan: Diabetes treatment: continue current medications; patient will try to get test strips refilled and check his blood sugar regularly for at least a week, then bring his meter in for downloaded and review. Refer to: none Instruction/counseling given: reminded to bring blood glucose meter & log to each visit

## 2012-03-13 ENCOUNTER — Ambulatory Visit: Payer: PRIVATE HEALTH INSURANCE | Admitting: Cardiology

## 2012-03-14 ENCOUNTER — Ambulatory Visit (INDEPENDENT_AMBULATORY_CARE_PROVIDER_SITE_OTHER): Payer: PRIVATE HEALTH INSURANCE | Admitting: *Deleted

## 2012-03-14 ENCOUNTER — Encounter: Payer: Self-pay | Admitting: Internal Medicine

## 2012-03-14 DIAGNOSIS — I428 Other cardiomyopathies: Secondary | ICD-10-CM

## 2012-03-14 LAB — ICD DEVICE OBSERVATION
BATTERY VOLTAGE: 2.57 V
CHARGE TIME: 10.99 s
RV LEAD IMPEDENCE ICD: 481 Ohm
TZAT-0001FASTVT: 1
TZAT-0001SLOWVT: 1
TZAT-0001SLOWVT: 2
TZAT-0004SLOWVT: 8
TZAT-0012FASTVT: 200 ms
TZAT-0012SLOWVT: 200 ms
TZAT-0012SLOWVT: 200 ms
TZAT-0013FASTVT: 1
TZAT-0013SLOWVT: 3
TZAT-0013SLOWVT: 3
TZAT-0018FASTVT: NEGATIVE
TZAT-0018SLOWVT: NEGATIVE
TZAT-0018SLOWVT: NEGATIVE
TZAT-0019SLOWVT: 8 V
TZAT-0019SLOWVT: 8 V
TZAT-0020FASTVT: 1.6 ms
TZAT-0020SLOWVT: 1.6 ms
TZAT-0020SLOWVT: 1.6 ms
TZON-0003FASTVT: 250 ms
TZON-0003SLOWVT: 360 ms
TZON-0004SLOWVT: 16
TZON-0005SLOWVT: 12
TZON-0010FASTVT: 30 ms
TZON-0010SLOWVT: 30 ms
TZON-0010VSLOWVT: 30 ms
TZST-0001FASTVT: 2
TZST-0001FASTVT: 4
TZST-0001FASTVT: 5
TZST-0001SLOWVT: 3
TZST-0001SLOWVT: 5
TZST-0003FASTVT: 30 J
TZST-0003FASTVT: 30 J
TZST-0003FASTVT: 30 J
TZST-0003SLOWVT: 22 J
TZST-0003SLOWVT: 30 J
VF: 0

## 2012-03-14 NOTE — Progress Notes (Signed)
icd check in clinic---battery check

## 2012-03-20 ENCOUNTER — Other Ambulatory Visit: Payer: Self-pay | Admitting: *Deleted

## 2012-03-20 MED ORDER — INSULIN PEN NEEDLE 31G X 6 MM MISC: Status: DC

## 2012-04-03 ENCOUNTER — Ambulatory Visit (INDEPENDENT_AMBULATORY_CARE_PROVIDER_SITE_OTHER): Payer: PRIVATE HEALTH INSURANCE | Admitting: Internal Medicine

## 2012-04-03 ENCOUNTER — Encounter: Payer: Self-pay | Admitting: Internal Medicine

## 2012-04-03 VITALS — BP 110/61 | HR 58 | Temp 96.3°F | Ht 70.5 in | Wt 178.5 lb

## 2012-04-03 DIAGNOSIS — E119 Type 2 diabetes mellitus without complications: Secondary | ICD-10-CM

## 2012-04-03 DIAGNOSIS — E785 Hyperlipidemia, unspecified: Secondary | ICD-10-CM

## 2012-04-03 DIAGNOSIS — Z79899 Other long term (current) drug therapy: Secondary | ICD-10-CM

## 2012-04-03 DIAGNOSIS — H11433 Conjunctival hyperemia, bilateral: Secondary | ICD-10-CM | POA: Insufficient documentation

## 2012-04-03 DIAGNOSIS — R972 Elevated prostate specific antigen [PSA]: Secondary | ICD-10-CM

## 2012-04-03 DIAGNOSIS — H109 Unspecified conjunctivitis: Secondary | ICD-10-CM | POA: Insufficient documentation

## 2012-04-03 LAB — GLUCOSE, CAPILLARY: Glucose-Capillary: 169 mg/dL — ABNORMAL HIGH (ref 70–99)

## 2012-04-03 LAB — POCT GLYCOSYLATED HEMOGLOBIN (HGB A1C): Hemoglobin A1C: 8.5

## 2012-04-03 NOTE — Patient Instructions (Signed)
Follow up with [facility] tomorrow   Follow up with Dr Karsten Ro for elevated PSA on the July 23.  Please come with Glucometer reading for your next visit in this clinic in one month.  Continue with you medications are usual.

## 2012-04-03 NOTE — Assessment & Plan Note (Signed)
Patient is taking insulin and is compliant with the treatment. His HBA1C today is 8.5. This indicates a less than adequate control of his Diabetes. He will continue with his current dose. He has been encouraged to bring his glucometer in order to see the control of his blood sugar. After reviewing his glucometer reading on the next visit, we will consider adding another medication like metformin.

## 2012-04-03 NOTE — Progress Notes (Signed)
Appt has been scheduled w/Dr Katy Fitch April 04, 2012 @ [id number] and an appt has been scheduled w/Dr. Karsten Ro at [facility] July 23,2013 @ 1230PM;appt cards given to pt.

## 2012-04-03 NOTE — Assessment & Plan Note (Signed)
Given the nature of his eye condition with redness and thick purulent discharge, this is most likely bacterial conjunctivitis but a viral cause remains possible. He saw an ophthalmologist barely three days and had his eyes evaluated. He was started on Ofloxacin eye drops. An appointment back to his ophthalmologist has been set up for tomorrow. In the meantime, he will continue with his current treatment.

## 2012-04-03 NOTE — Assessment & Plan Note (Signed)
Noted a high PSA reading from the previous tests and follow up by Dr Vira Blanco. His readings however, have trended down from 59.41 on 04/27/2011 to 7.3 on 02/14/2012. I have discussed with Dr Marinda Elk and we both felt that he should have another appointment for this problem given a high prevalence of BPH and Prostate cancer in this age group. An appointment with Dr Vira Blanco has been set up for him.

## 2012-04-03 NOTE — Progress Notes (Signed)
  Subjective:    Patient ID: [name], male    DOB: [birth year], 78 y.o.   MRN: [id number]  HPI [name] is a 78yr old man with a h/o diabetes type 2, CAD status post stents, controlled congestive heart failure due to ischemic cardiomyopathy who present with 3 days history of bilateral eye irritation and discharge. He woke up three nights ago and noticed that his eyes were red and tearing excessively. The tearing progressed into a thick discharge. However, he reports usual vision in both eyes. No history of contact with someone with red eyes. He reports no history of fever or chills. He was evaluated by an ophthamologist, Dr Earl Gala at [facility], who prescribed eyes drops of Ofloxacin. He reports no improvement after 3 days of using the antibiotic. Two weeks ago he was seen by Dr Marinda Elk for flu like infection causing him nasal congestion but this has resolved after using a nasal spray of Nasomex. No other symptoms at this time.   Review of Systems  Constitutional: Negative for diaphoresis, activity change, appetite change and fatigue.  HENT: Positive for rhinorrhea. Negative for hearing loss, nosebleeds, congestion, facial swelling, sneezing, neck pain, neck stiffness, postnasal drip and tinnitus.   Eyes: Negative for photophobia and visual disturbance.  Respiratory: Negative for apnea, cough, chest tightness and shortness of breath.   Cardiovascular: Negative for chest pain, palpitations and leg swelling.  Gastrointestinal: Negative for abdominal distention.  Genitourinary: Negative for dysuria, frequency, hematuria, flank pain, discharge, enuresis, difficulty urinating, genital sores and penile pain.  Musculoskeletal: Negative for gait problem.  Neurological: Negative for dizziness, light-headedness, numbness and headaches.       Objective:   Physical Exam  Constitutional: He is oriented to person, place, and time. He appears well-nourished. No distress.  HENT:  Head:  Normocephalic and atraumatic.  Right Ear: External ear normal.  Left Ear: External ear normal.  Nose: Nose normal.  Mouth/Throat: Oropharynx is clear and moist. No oropharyngeal exudate.  Eyes: EOM are normal. Pupils are equal, round, and reactive to light. Right eye exhibits discharge. Left eye exhibits discharge. No scleral icterus.         Thick purulent bilateral eyes discharge.  Neck: No JVD present. No tracheal deviation present. No thyromegaly present.  Cardiovascular: Normal rate, regular rhythm, normal heart sounds and intact distal pulses.  Exam reveals no gallop and no friction rub.   No murmur heard. Pulmonary/Chest: Breath sounds normal. No respiratory distress. He has no wheezes. He has no rales. He exhibits no tenderness.  Abdominal: Bowel sounds are normal. He exhibits no distension. There is no tenderness.  Lymphadenopathy:    He has no cervical adenopathy.  Neurological: He is alert and oriented to person, place, and time.  Psychiatric: He has a normal mood and affect.          Assessment & Plan:

## 2012-04-03 NOTE — Assessment & Plan Note (Signed)
His LDL level 2 months ago was 38 which is a good result. He will continue with Simvastatin.

## 2012-04-05 NOTE — Progress Notes (Signed)
I saw patient and discussed his care with resident Dr. Alice Rieger.  I agree with the clinical findings and plans as outlined in his note.

## 2012-05-12 ENCOUNTER — Other Ambulatory Visit: Payer: Self-pay | Admitting: Internal Medicine

## 2012-05-22 ENCOUNTER — Encounter: Payer: Self-pay | Admitting: Internal Medicine

## 2012-06-04 ENCOUNTER — Ambulatory Visit (INDEPENDENT_AMBULATORY_CARE_PROVIDER_SITE_OTHER): Payer: PRIVATE HEALTH INSURANCE | Admitting: Cardiology

## 2012-06-04 ENCOUNTER — Encounter: Payer: Self-pay | Admitting: Internal Medicine

## 2012-06-04 DIAGNOSIS — Z95810 Presence of automatic (implantable) cardiac defibrillator: Secondary | ICD-10-CM

## 2012-06-04 DIAGNOSIS — I2589 Other forms of chronic ischemic heart disease: Secondary | ICD-10-CM

## 2012-06-04 LAB — ICD DEVICE OBSERVATION
BATTERY VOLTAGE: 2.57 V
DEV-0020ICD: NEGATIVE
FVT: 0
HV IMPEDENCE: 18 Ohm
PACEART VT: 0
RV LEAD IMPEDENCE ICD: 481 Ohm
TZAT-0004FASTVT: 8
TZAT-0004SLOWVT: 8
TZAT-0004SLOWVT: 8
TZAT-0005FASTVT: 88 pct
TZAT-0005SLOWVT: 84 pct
TZAT-0005SLOWVT: 91 pct
TZAT-0011SLOWVT: 10 ms
TZAT-0011SLOWVT: 10 ms
TZAT-0012FASTVT: 200 ms
TZAT-0012SLOWVT: 200 ms
TZAT-0012SLOWVT: 200 ms
TZAT-0013FASTVT: 1
TZAT-0013SLOWVT: 3
TZAT-0020FASTVT: 1.6 ms
TZAT-0020SLOWVT: 1.6 ms
TZON-0003FASTVT: 250 ms
TZON-0003SLOWVT: 360 ms
TZON-0010AFLUTTER: 30 ms
TZON-0010FASTVT: 30 ms
TZON-0010VSLOWVT: 30 ms
TZST-0001FASTVT: 4
TZST-0001FASTVT: 5
TZST-0001SLOWVT: 4
TZST-0001SLOWVT: 5
TZST-0001SLOWVT: 6
TZST-0003FASTVT: 30 J
TZST-0003FASTVT: 30 J
TZST-0003FASTVT: 30 J
TZST-0003SLOWVT: 12 J
TZST-0003SLOWVT: 30 J
VF: 0

## 2012-06-04 NOTE — Progress Notes (Signed)
Device check only. See PaceArt report.

## 2012-08-06 ENCOUNTER — Encounter: Payer: Self-pay | Admitting: *Deleted

## 2012-08-13 ENCOUNTER — Ambulatory Visit (INDEPENDENT_AMBULATORY_CARE_PROVIDER_SITE_OTHER): Payer: PRIVATE HEALTH INSURANCE | Admitting: Internal Medicine

## 2012-08-13 ENCOUNTER — Encounter: Payer: Self-pay | Admitting: Internal Medicine

## 2012-08-13 VITALS — BP 130/82 | HR 73 | Resp 16 | Ht 71.0 in | Wt 182.0 lb

## 2012-08-13 DIAGNOSIS — Z95810 Presence of automatic (implantable) cardiac defibrillator: Secondary | ICD-10-CM

## 2012-08-13 DIAGNOSIS — I2589 Other forms of chronic ischemic heart disease: Secondary | ICD-10-CM

## 2012-08-13 DIAGNOSIS — I509 Heart failure, unspecified: Secondary | ICD-10-CM

## 2012-08-13 LAB — ICD DEVICE OBSERVATION
FVT: 0
HV IMPEDENCE: 18 Ohm
RV LEAD IMPEDENCE ICD: 521 Ohm
RV LEAD THRESHOLD: 1 V
TZAT-0004FASTVT: 8
TZAT-0004SLOWVT: 8
TZAT-0004SLOWVT: 8
TZAT-0005FASTVT: 88 pct
TZAT-0005SLOWVT: 91 pct
TZAT-0011SLOWVT: 10 ms
TZAT-0011SLOWVT: 10 ms
TZAT-0012FASTVT: 200 ms
TZAT-0012SLOWVT: 200 ms
TZAT-0012SLOWVT: 200 ms
TZAT-0013FASTVT: 1
TZAT-0013SLOWVT: 3
TZAT-0013SLOWVT: 3
TZAT-0020SLOWVT: 1.6 ms
TZAT-0020SLOWVT: 1.6 ms
TZON-0003FASTVT: 250 ms
TZON-0003SLOWVT: 360 ms
TZON-0010AFLUTTER: 30 ms
TZON-0010SLOWVT: 30 ms
TZST-0001FASTVT: 5
TZST-0001SLOWVT: 4
TZST-0001SLOWVT: 6
TZST-0003FASTVT: 30 J
TZST-0003FASTVT: 30 J
TZST-0003FASTVT: 30 J
TZST-0003SLOWVT: 22 J
TZST-0003SLOWVT: 30 J
VF: 0

## 2012-08-13 NOTE — Assessment & Plan Note (Signed)
His ICD continues to work normally. We'll plan to recheck in several months.

## 2012-08-13 NOTE — Assessment & Plan Note (Signed)
His chronic systolic heart failure is class II. He will continue his current medical therapy and maintain a low-sodium diet.

## 2012-08-13 NOTE — Patient Instructions (Signed)
Your physician recommends that you schedule a follow-up appointment in: 2 months in the device clinic and 12 months with Dr Lovena Le

## 2012-08-13 NOTE — Progress Notes (Signed)
HPI [name] returns today for followup. He is a 78 year old man with a history of an ischemic cardiomyopathy, chronic systolic heart failure, ventricular tachycardia, status post ICD insertion. He is done amazingly well over the last 8 years. He currently denies chest pain or shortness of breath. He has minimal peripheral edema. He has had no recent ICD shock and no syncope. No Known Allergies   Current Outpatient Prescriptions  Medication Sig Dispense Refill  . AGGRENOX 25-200 MG per 12 hr capsule TAKE ONE CAPSULE TWICE DAILY  60 each  9  . Blood Glucose Monitoring Suppl (ACCU-CHEK AVIVA PLUS) W/DEVICE KIT 1 kit by Does not apply route as directed.  1 kit  0  . carvedilol (COREG) 25 MG tablet Take 1 tablet (25 mg total) by mouth 2 (two) times daily with a meal.  60 tablet  5  . insulin glargine (LANTUS SOLOSTAR) 100 UNIT/ML injection Inject 32 Units into the skin daily.  15 mL  3  . Insulin Pen Needle 31G X 6 MM MISC Use as directed.  100 each  3  . pantoprazole (PROTONIX) 40 MG tablet TAKE ONE TABLET BY MOUTH ONE TIME DAILY  30 tablet  9  . simvastatin (ZOCOR) 40 MG tablet TAKE ONE TABLET BY MOUTH ONE TIME DAILY  30 tablet  9     Past Medical History  Diagnosis Date  . Anemia   . Cataracts, bilateral   . CHF (congestive heart failure)     ischemic CM EF 15-20% s/p AICD 05/24/04  . CAD (coronary artery disease)     s/p AMI, s/p PTCA & stent of cx 12/04 restent 5/05  . Diabetes mellitus     type 2  . Diverticulosis of colon   . Hyperlipidemia   . PVD (peripheral vascular disease)     s/p L carotid PTCA/stent 2004  . Transient ischemic attack   . Erectile dysfunction   . Hyperkalemia 08/2008    K=5.7   . BBB (bundle branch block)     right  . HTN (hypertension)   . Elevated PSA   . Adenomatous colon polyp 02/14/2012    ROS:   All systems reviewed and negative except as noted in the HPI.   Past Surgical History  Procedure Date  . Cardiac catheterization 06/2003,   01/2004  . Cardiac defibrillator placement 05/24/2004    Implantation of a single-chamber defibrillator  . Carotid stent 09/11/2003    Percutaneous transluminal angioplasty and stent placement of the left internal carotid artery.     Family History  Problem Relation Age of Onset  . Diabetes Mother   . Diabetes Brother      History   Social History  . Marital Status: Widowed    Spouse Name: N/A    Number of Children: N/A  . Years of Education: N/A   Occupational History  . Not on file.   Social History Main Topics  . Smoking status: Former Smoker    Types: Cigarettes    Quit date: 12/27/1967  . Smokeless tobacco: Never Used  . Alcohol Use: No  . Drug Use: No  . Sexually Active: Not on file   Other Topics Concern  . Not on file   Social History Narrative   Single, 2 adult children, daughter in [place], son in [place]     BP 130/82  Pulse 73  Resp 16  Ht 5\' 11"  (1.803 m)  Wt 182 lb (82.555 kg)  BMI 25.38 kg/m2  Physical  Exam:  Well appearing 79 year old man, NAD HEENT: Unremarkable Neck:  7 cm JVD, no thyromegally Lungs:  Clear with no wheezes, rales, or rhonchi. Well-healed ICD incision. HEART:  Regular rate rhythm, no murmurs, no rubs, no clicks Abd:  soft, positive bowel sounds, no organomegally, no rebound, no guarding Ext:  2 plus pulses, no edema, no cyanosis, no clubbing Skin:  No rashes no nodules Neuro:  CN II through XII intact, motor grossly intact  DEVICE  Normal device function.  See PaceArt for details.   Assess/Plan:

## 2012-08-13 NOTE — Assessment & Plan Note (Signed)
He denies anginal symptoms. We'll plan to recheck in several months.

## 2012-09-02 ENCOUNTER — Other Ambulatory Visit: Payer: Self-pay | Admitting: *Deleted

## 2012-09-02 DIAGNOSIS — I2510 Atherosclerotic heart disease of native coronary artery without angina pectoris: Secondary | ICD-10-CM

## 2012-09-02 MED ORDER — CARVEDILOL 25 MG PO TABS: 25.0000 mg | ORAL_TABLET | Freq: Two times a day (BID) | ORAL | Status: DC

## 2012-10-06 ENCOUNTER — Emergency Department (HOSPITAL_COMMUNITY): Payer: Medicare PPO

## 2012-10-06 ENCOUNTER — Emergency Department (HOSPITAL_COMMUNITY)
Admission: EM | Admit: 2012-10-06 | Discharge: 2012-10-06 | Disposition: A | Discharge status: Home / Self Care | Payer: Medicare PPO | Attending: Emergency Medicine | Admitting: Emergency Medicine

## 2012-10-06 ENCOUNTER — Encounter (HOSPITAL_COMMUNITY): Payer: Self-pay | Admitting: *Deleted

## 2012-10-06 DIAGNOSIS — I509 Heart failure, unspecified: Secondary | ICD-10-CM | POA: Insufficient documentation

## 2012-10-06 DIAGNOSIS — I10 Essential (primary) hypertension: Secondary | ICD-10-CM | POA: Insufficient documentation

## 2012-10-06 DIAGNOSIS — Z87891 Personal history of nicotine dependence: Secondary | ICD-10-CM | POA: Insufficient documentation

## 2012-10-06 DIAGNOSIS — J209 Acute bronchitis, unspecified: Secondary | ICD-10-CM | POA: Insufficient documentation

## 2012-10-06 DIAGNOSIS — E119 Type 2 diabetes mellitus without complications: Secondary | ICD-10-CM | POA: Insufficient documentation

## 2012-10-06 DIAGNOSIS — Z8719 Personal history of other diseases of the digestive system: Secondary | ICD-10-CM | POA: Insufficient documentation

## 2012-10-06 DIAGNOSIS — Z794 Long term (current) use of insulin: Secondary | ICD-10-CM | POA: Insufficient documentation

## 2012-10-06 DIAGNOSIS — Z860100 Personal history of colon polyps, unspecified: Secondary | ICD-10-CM | POA: Insufficient documentation

## 2012-10-06 DIAGNOSIS — Z79899 Other long term (current) drug therapy: Secondary | ICD-10-CM | POA: Insufficient documentation

## 2012-10-06 DIAGNOSIS — Z862 Personal history of diseases of the blood and blood-forming organs and certain disorders involving the immune mechanism: Secondary | ICD-10-CM | POA: Insufficient documentation

## 2012-10-06 DIAGNOSIS — Z8673 Personal history of transient ischemic attack (TIA), and cerebral infarction without residual deficits: Secondary | ICD-10-CM | POA: Insufficient documentation

## 2012-10-06 DIAGNOSIS — I2510 Atherosclerotic heart disease of native coronary artery without angina pectoris: Secondary | ICD-10-CM | POA: Insufficient documentation

## 2012-10-06 DIAGNOSIS — Z9849 Cataract extraction status, unspecified eye: Secondary | ICD-10-CM | POA: Insufficient documentation

## 2012-10-06 DIAGNOSIS — I739 Peripheral vascular disease, unspecified: Secondary | ICD-10-CM | POA: Insufficient documentation

## 2012-10-06 DIAGNOSIS — Z95810 Presence of automatic (implantable) cardiac defibrillator: Secondary | ICD-10-CM | POA: Insufficient documentation

## 2012-10-06 MED ORDER — HYDROCOD POLST-CHLORPHEN POLST 10-8 MG/5ML PO LQCR: 5.0000 mL | Freq: Two times a day (BID) | ORAL | Status: DC | PRN

## 2012-10-06 MED ORDER — AZITHROMYCIN 250 MG PO TABS: 250.0000 mg | ORAL_TABLET | Freq: Every day | ORAL | Status: DC

## 2012-10-06 NOTE — ED Notes (Signed)
Patient transported to X-ray 

## 2012-10-06 NOTE — ED Provider Notes (Signed)
History     CSN: [id number]  Arrival date & time 10/06/12  1031   First MD Initiated Contact with Patient 10/06/12 1243      Chief Complaint  Patient presents with  . Headache    (Consider location/radiation/quality/duration/timing/severity/associated sxs/prior treatment) Patient is a 79 y.o. male presenting with headaches. The history is provided by the patient.  Headache  This is a new problem. The current episode started 2 days ago. The problem occurs constantly. The problem has been rapidly worsening. The headache is associated with nothing. The pain is located in the frontal region. Associated symptoms comments: Cough, chest congestion. He has tried nothing for the symptoms.    Past Medical History  Diagnosis Date  . Anemia   . Cataracts, bilateral   . CHF (congestive heart failure)     ischemic CM EF 15-20% s/p AICD 05/24/04  . CAD (coronary artery disease)     s/p AMI, s/p PTCA & stent of cx 12/04 restent 5/05  . Diabetes mellitus     type 2  . Diverticulosis of colon   . Hyperlipidemia   . PVD (peripheral vascular disease)     s/p L carotid PTCA/stent 2004  . Transient ischemic attack   . Erectile dysfunction   . Hyperkalemia 08/2008    K=5.7   . BBB (bundle branch block)     right  . HTN (hypertension)   . Elevated PSA   . Adenomatous colon polyp 02/14/2012    Past Surgical History  Procedure Date  . Cardiac catheterization 06/2003,  01/2004  . Cardiac defibrillator placement 05/24/2004    Implantation of a single-chamber defibrillator  . Carotid stent 09/11/2003    Percutaneous transluminal angioplasty and stent placement of the left internal carotid artery.    Family History  Problem Relation Age of Onset  . Diabetes Mother   . Diabetes Brother     History  Substance Use Topics  . Smoking status: Former Smoker    Types: Cigarettes    Quit date: 12/27/1967  . Smokeless tobacco: Never Used  . Alcohol Use: No      Review of Systems    Neurological: Positive for headaches.  All other systems reviewed and are negative.    Allergies  Review of patient's allergies indicates no known allergies.  Home Medications   Current Outpatient Rx  Name  Route  Sig  Dispense  Refill  . AGGRENOX 25-200 MG PO CP12      TAKE ONE CAPSULE TWICE DAILY   60 each   9   . ACCU-CHEK AVIVA PLUS W/DEVICE KIT   Does not apply   1 kit by Does not apply route as directed.   1 kit   0   . CARVEDILOL 25 MG PO TABS   Oral   Take 1 tablet (25 mg total) by mouth 2 (two) times daily with a meal.   60 tablet   6   . INSULIN GLARGINE 100 UNIT/ML [place] SOLN   Subcutaneous   Inject 32 Units into the skin daily.         . INSULIN PEN NEEDLE 31G X 6 MM MISC      Use as directed.   100 each   3   . PANTOPRAZOLE SODIUM 40 MG PO TBEC      TAKE ONE TABLET BY MOUTH ONE TIME DAILY   30 tablet   9   . SIMVASTATIN 40 MG PO TABS      TAKE ONE  TABLET BY MOUTH ONE TIME DAILY   30 tablet   9     BP 129/65  Pulse 71  Temp 98.1 F (36.7 C) (Oral)  Resp 22  Ht 5\' 11"  (1.803 m)  Wt 190 lb (86.183 kg)  BMI 26.50 kg/m2  SpO2 97%  Physical Exam  Nursing note and vitals reviewed. Constitutional: He is oriented to person, place, and time. He appears well-developed and well-nourished. No distress.  HENT:  Head: Normocephalic and atraumatic.  Mouth/Throat: Oropharynx is clear and moist.  Neck: Normal range of motion. Neck supple.  Cardiovascular: Normal rate and regular rhythm.   No murmur heard. Pulmonary/Chest: Effort normal and breath sounds normal. No respiratory distress.  Abdominal: Soft. Bowel sounds are normal. He exhibits no distension. There is no tenderness.  Musculoskeletal: Normal range of motion. He exhibits no edema.  Lymphadenopathy:    He has no cervical adenopathy.  Neurological: He is alert and oriented to person, place, and time.  Skin: Skin is warm and dry. He is not diaphoretic.    ED Course  Procedures  (including critical care time)  Labs Reviewed - No data to display No results found.   No diagnosis found.    MDM  No evidence for pneumonia on chest xray.  Symptoms are likely viral in nature, however due to productive cough, DM and comorbidities, I will prescribe zithromax and tussionex.  To return prn for any worsening of breathing.        Veryl Speak, MD 10/06/12 1400

## 2012-10-06 NOTE — ED Notes (Signed)
Patient with complaints of headache.  Denies any n/v/d    Denies body aches.  He also reports some dizziness

## 2012-10-07 ENCOUNTER — Ambulatory Visit (INDEPENDENT_AMBULATORY_CARE_PROVIDER_SITE_OTHER): Payer: Medicare PPO | Admitting: *Deleted

## 2012-10-07 DIAGNOSIS — I2589 Other forms of chronic ischemic heart disease: Secondary | ICD-10-CM

## 2012-10-07 DIAGNOSIS — Z95810 Presence of automatic (implantable) cardiac defibrillator: Secondary | ICD-10-CM

## 2012-10-07 LAB — ICD DEVICE OBSERVATION
DEV-0020ICD: NEGATIVE
FVT: 0
HV IMPEDENCE: 18 Ohm
RV LEAD IMPEDENCE ICD: 481 Ohm
TZAT-0005FASTVT: 88 pct
TZAT-0005SLOWVT: 84 pct
TZAT-0005SLOWVT: 91 pct
TZAT-0011FASTVT: 10 ms
TZAT-0011SLOWVT: 10 ms
TZAT-0011SLOWVT: 10 ms
TZAT-0012FASTVT: 200 ms
TZAT-0012SLOWVT: 200 ms
TZAT-0012SLOWVT: 200 ms
TZAT-0013SLOWVT: 3
TZAT-0018FASTVT: NEGATIVE
TZAT-0018SLOWVT: NEGATIVE
TZAT-0018SLOWVT: NEGATIVE
TZON-0003SLOWVT: 360 ms
TZON-0004SLOWVT: 32
TZON-0010AFLUTTER: 30 ms
TZST-0001FASTVT: 3
TZST-0001FASTVT: 5
TZST-0001SLOWVT: 4
TZST-0001SLOWVT: 6
TZST-0003FASTVT: 30 J
TZST-0003FASTVT: 30 J
TZST-0003FASTVT: 30 J
TZST-0003SLOWVT: 30 J

## 2012-10-07 NOTE — Patient Instructions (Addendum)
Return office visit 12/05/12 @2 :30pm with the device clinic.

## 2012-10-07 NOTE — Progress Notes (Signed)
ICD check

## 2012-10-14 ENCOUNTER — Emergency Department (INDEPENDENT_AMBULATORY_CARE_PROVIDER_SITE_OTHER): Payer: Medicare PPO

## 2012-10-14 ENCOUNTER — Emergency Department (INDEPENDENT_AMBULATORY_CARE_PROVIDER_SITE_OTHER)
Admission: EM | Admit: 2012-10-14 | Discharge: 2012-10-14 | Disposition: A | Discharge status: Home / Self Care | Payer: Medicare PPO | Source: Home / Self Care | Attending: Family Medicine | Admitting: Family Medicine

## 2012-10-14 ENCOUNTER — Encounter (HOSPITAL_COMMUNITY): Payer: Self-pay | Admitting: Emergency Medicine

## 2012-10-14 DIAGNOSIS — J069 Acute upper respiratory infection, unspecified: Secondary | ICD-10-CM

## 2012-10-14 MED ORDER — HYDROCOD POLST-CHLORPHEN POLST 10-8 MG/5ML PO LQCR: 5.0000 mL | Freq: Two times a day (BID) | ORAL | Status: DC | PRN

## 2012-10-14 NOTE — ED Notes (Signed)
Pt c/o cold sx States he was in the ED and dx w/Bronchitis; Finished course of Azithromycin and tolerated well Sx include: cough w/yellow mucous, chest discomfort due to cough, headaches, clear runny nose Denies: f/v/n/d  He is alert and responsive w/no signs of acute distress.

## 2012-10-14 NOTE — ED Provider Notes (Signed)
History     CSN: [id number]  Arrival date & time 10/14/12  1243   First MD Initiated Contact with Patient 10/14/12 1356      Chief Complaint  Patient presents with  . URI    (Consider location/radiation/quality/duration/timing/severity/associated sxs/prior treatment) Patient is a 79 y.o. male presenting with cough. The history is provided by the patient.  Cough This is a recurrent problem. The current episode started more than 1 week ago (seen in ER 1/12 and given z-pak and tussionex, sx continue but no fever, n/v/d.). The problem has not changed since onset.The cough is non-productive. Associated symptoms include rhinorrhea. Pertinent negatives include no chills, no myalgias, no shortness of breath and no wheezing.    Past Medical History  Diagnosis Date  . Anemia   . Cataracts, bilateral   . CHF (congestive heart failure)     ischemic CM EF 15-20% s/p AICD 05/24/04  . CAD (coronary artery disease)     s/p AMI, s/p PTCA & stent of cx 12/04 restent 5/05  . Diabetes mellitus     type 2  . Diverticulosis of colon   . Hyperlipidemia   . PVD (peripheral vascular disease)     s/p L carotid PTCA/stent 2004  . Transient ischemic attack   . Erectile dysfunction   . Hyperkalemia 08/2008    K=5.7   . BBB (bundle branch block)     right  . HTN (hypertension)   . Elevated PSA   . Adenomatous colon polyp 02/14/2012    Past Surgical History  Procedure Date  . Cardiac catheterization 06/2003,  01/2004  . Cardiac defibrillator placement 05/24/2004    Implantation of a single-chamber defibrillator  . Carotid stent 09/11/2003    Percutaneous transluminal angioplasty and stent placement of the left internal carotid artery.    Family History  Problem Relation Age of Onset  . Diabetes Mother   . Diabetes Brother     History  Substance Use Topics  . Smoking status: Former Smoker    Types: Cigarettes    Quit date: 12/27/1967  . Smokeless tobacco: Never Used  . Alcohol Use: No        Review of Systems  Constitutional: Negative for chills.  HENT: Positive for congestion and rhinorrhea.   Respiratory: Positive for cough. Negative for shortness of breath and wheezing.   Gastrointestinal: Negative.   Genitourinary: Negative.   Musculoskeletal: Negative for myalgias.    Allergies  Review of patient's allergies indicates no known allergies.  Home Medications   Current Outpatient Rx  Name  Route  Sig  Dispense  Refill  . AGGRENOX 25-200 MG PO CP12      TAKE ONE CAPSULE TWICE DAILY   60 each   9   . AZITHROMYCIN 250 MG PO TABS   Oral   Take 1 tablet (250 mg total) by mouth daily. Take first 2 tablets together, then 1 every day until finished.   6 tablet   0   . CARVEDILOL 25 MG PO TABS   Oral   Take 1 tablet (25 mg total) by mouth 2 (two) times daily with a meal.   60 tablet   6   . INSULIN GLARGINE 100 UNIT/ML [place] SOLN   Subcutaneous   Inject 32 Units into the skin daily.         Marland Kitchen PANTOPRAZOLE SODIUM 40 MG PO TBEC      TAKE ONE TABLET BY MOUTH ONE TIME DAILY   30 tablet  9   . SIMVASTATIN 40 MG PO TABS      TAKE ONE TABLET BY MOUTH ONE TIME DAILY   30 tablet   9   . ACCU-CHEK AVIVA PLUS W/DEVICE KIT   Does not apply   1 kit by Does not apply route as directed.   1 kit   0   . HYDROCOD POLST-CPM POLST ER 10-8 MG/5ML PO LQCR   Oral   Take 5 mLs by mouth every 12 (twelve) hours as needed.   115 mL   0   . HYDROCOD POLST-CPM POLST ER 10-8 MG/5ML PO LQCR   Oral   Take 5 mLs by mouth every 12 (twelve) hours as needed.   115 mL   0   . INSULIN PEN NEEDLE 31G X 6 MM MISC      Use as directed.   100 each   3     BP 125/80  Pulse 68  Temp 99.2 F (37.3 C) (Oral)  Resp 20  SpO2 100%  Physical Exam  Nursing note and vitals reviewed. Constitutional: He appears well-developed and well-nourished.  HENT:  Head: Normocephalic.  Right Ear: External ear normal.  Left Ear: External ear normal.  Mouth/Throat: Oropharynx  is clear and moist.  Eyes: Conjunctivae normal are normal. Pupils are equal, round, and reactive to light.  Neck: Normal range of motion. Neck supple.  Cardiovascular: Normal rate, regular rhythm, normal heart sounds and intact distal pulses.        Pacemaker l chest.  Pulmonary/Chest: Effort normal and breath sounds normal.  Abdominal: Soft. Bowel sounds are normal.    ED Course  Procedures (including critical care time)  Labs Reviewed - No data to display Dg Chest 2 View  10/14/2012  *RADIOLOGY REPORT*  Clinical Data: Cough and shortness of breath  CHEST - 2 VIEW  Comparison: 10/06/2012  Findings: The heart and pulmonary vascularity are stable.  A defibrillator is again seen. The lungs are clear bilaterally.  No sizable effusion or pneumothorax is noted.  IMPRESSION: No acute abnormality noted.   Original Report Authenticated By: [name], M.D.      1. URI (upper respiratory infection)       MDM  X-rays reviewed and report per radiologist.         Billy Fischer, MD 10/14/12 [phone]

## 2012-10-18 ENCOUNTER — Other Ambulatory Visit: Payer: Self-pay | Admitting: *Deleted

## 2012-10-18 DIAGNOSIS — E119 Type 2 diabetes mellitus without complications: Secondary | ICD-10-CM

## 2012-10-18 MED ORDER — INSULIN GLARGINE 100 UNIT/ML ~~LOC~~ SOLN: 32.0000 [IU] | Freq: Every day | SUBCUTANEOUS | Status: DC

## 2012-10-23 ENCOUNTER — Encounter: Payer: Self-pay | Admitting: Internal Medicine

## 2012-10-24 ENCOUNTER — Encounter: Payer: Self-pay | Admitting: Internal Medicine

## 2012-11-21 ENCOUNTER — Other Ambulatory Visit: Payer: Self-pay | Admitting: Internal Medicine

## 2012-11-21 ENCOUNTER — Encounter: Payer: Self-pay | Admitting: Internal Medicine

## 2012-11-21 ENCOUNTER — Ambulatory Visit (INDEPENDENT_AMBULATORY_CARE_PROVIDER_SITE_OTHER): Payer: Medicare PPO | Admitting: *Deleted

## 2012-11-21 DIAGNOSIS — I2589 Other forms of chronic ischemic heart disease: Secondary | ICD-10-CM

## 2012-11-21 LAB — ICD DEVICE OBSERVATION
RV LEAD AMPLITUDE: 9 mv
RV LEAD THRESHOLD: 1 V
VENTRICULAR PACING ICD: 1 pct

## 2012-11-21 NOTE — Progress Notes (Signed)
ICD check

## 2012-12-12 ENCOUNTER — Telehealth: Payer: Self-pay | Admitting: *Deleted

## 2012-12-12 NOTE — Telephone Encounter (Signed)
Received PA request for pt's aggrenox, insurance was given all cardiac related diagnoses on pt's chart.  Chart will be sent for review and a decision will be made 24-72 hours.  If request is denied, then pt's pcp will have to complete an appeal.Robert Lagunes Cassady3/20/20143:23 PM

## 2012-12-19 ENCOUNTER — Encounter: Payer: Self-pay | Admitting: Internal Medicine

## 2012-12-19 ENCOUNTER — Ambulatory Visit (INDEPENDENT_AMBULATORY_CARE_PROVIDER_SITE_OTHER): Payer: Medicare PPO | Admitting: Internal Medicine

## 2012-12-19 ENCOUNTER — Encounter: Payer: Self-pay | Admitting: *Deleted

## 2012-12-19 ENCOUNTER — Other Ambulatory Visit: Payer: Self-pay | Admitting: *Deleted

## 2012-12-19 VITALS — BP 139/79 | HR 68 | Ht 71.0 in | Wt 185.0 lb

## 2012-12-19 DIAGNOSIS — I509 Heart failure, unspecified: Secondary | ICD-10-CM

## 2012-12-19 DIAGNOSIS — I2589 Other forms of chronic ischemic heart disease: Secondary | ICD-10-CM

## 2012-12-19 DIAGNOSIS — Z45018 Encounter for adjustment and management of other part of cardiac pacemaker: Secondary | ICD-10-CM

## 2012-12-19 DIAGNOSIS — Z95810 Presence of automatic (implantable) cardiac defibrillator: Secondary | ICD-10-CM

## 2012-12-19 DIAGNOSIS — I255 Ischemic cardiomyopathy: Secondary | ICD-10-CM

## 2012-12-19 LAB — CBC WITH DIFFERENTIAL/PLATELET
Basophils Absolute: 0 10*3/uL (ref 0.0–0.1)
Basophils Relative: 0.5 % (ref 0.0–3.0)
Hemoglobin: 13.9 g/dL (ref 13.0–17.0)
Lymphocytes Relative: 32.6 % (ref 12.0–46.0)
Monocytes Relative: 11 % (ref 3.0–12.0)
Neutro Abs: 4.2 10*3/uL (ref 1.4–7.7)
RBC: 4.46 Mil/uL (ref 4.22–5.81)
RDW: 13.8 % (ref 11.5–14.6)

## 2012-12-19 LAB — BASIC METABOLIC PANEL
Calcium: 9.2 mg/dL (ref 8.4–10.5)
GFR: 58.37 mL/min — ABNORMAL LOW (ref >60.00)
Sodium: 134 mEq/L — ABNORMAL LOW (ref 135–145)

## 2012-12-19 NOTE — Progress Notes (Signed)
HPI Mr. Sapia returns today for followup. He is a very pleasant 79 year old man with a nonischemic cardio myopathy, chronic systolic heart failure, ejection fraction 30% by echo, status post ICD implantation. The patient has class II congestive heart failure symptoms. The patient's has worsening conduction in his AV node. He has had right bundle branch block for many years, but in the last couple of years, his PR interval has increased, now to over 360 ms. In addition he has bundle branch block, with a QRS duration of 190 ms. He has not had syncope. No recent ICD shocks. No peripheral edema. No chest pain. Not on File   Current Outpatient Prescriptions  Medication Sig Dispense Refill  . aspirin 81 MG tablet Take 81 mg by mouth daily.      . Blood Glucose Monitoring Suppl (ACCU-CHEK AVIVA PLUS) W/DEVICE KIT 1 kit by Does not apply route as directed.  1 kit  0  . carvedilol (COREG) 25 MG tablet Take 1 tablet (25 mg total) by mouth 2 (two) times daily with a meal.  60 tablet  6  . Insulin Pen Needle 31G X 6 MM MISC Use as directed.  100 each  3  . pantoprazole (PROTONIX) 40 MG tablet TAKE ONE TABLET BY MOUTH ONE TIME DAILY  30 tablet  9  . simvastatin (ZOCOR) 40 MG tablet TAKE ONE TABLET BY MOUTH ONE TIME DAILY  30 tablet  9   No current facility-administered medications for this visit.     Past Medical History  Diagnosis Date  . Anemia   . Cataracts, bilateral   . CHF (congestive heart failure)     ischemic CM EF 15-20% s/p AICD 05/24/04  . CAD (coronary artery disease)     s/p AMI, s/p PTCA & stent of cx 12/04 restent 5/05  . Diabetes mellitus     type 2  . Diverticulosis of colon   . Hyperlipidemia   . PVD (peripheral vascular disease)     s/p L carotid PTCA/stent 2004  . Transient ischemic attack   . Erectile dysfunction   . Hyperkalemia 08/2008    K=5.7   . BBB (bundle branch block)     right  . HTN (hypertension)   . Elevated PSA   . Adenomatous colon polyp 02/14/2012     ROS:   All systems reviewed and negative except as noted in the HPI.   Past Surgical History  Procedure Laterality Date  . Cardiac catheterization  06/2003,  01/2004  . Cardiac defibrillator placement  05/24/2004    Implantation of a single-chamber defibrillator  . Carotid stent  09/11/2003    Percutaneous transluminal angioplasty and stent placement of the left internal carotid artery.     Family History  Problem Relation Age of Onset  . Diabetes Mother   . Diabetes Brother      History   Social History  . Marital Status: Widowed    Spouse Name: N/A    Number of Children: N/A  . Years of Education: N/A   Occupational History  . Not on file.   Social History Main Topics  . Smoking status: Former Smoker    Types: Cigarettes    Quit date: 12/27/1967  . Smokeless tobacco: Never Used  . Alcohol Use: No  . Drug Use: No  . Sexually Active: Not on file   Other Topics Concern  . Not on file   Social History Narrative   Single, 2 adult children, daughter in [place], son  in [place]     BP 139/79  Pulse 68  Ht 5\' 11"  (1.803 m)  Wt 185 lb (83.915 kg)  BMI 25.81 kg/m2  Physical Exam:  Well appearing 79 year old man,NAD HEENT: Unremarkable Neck:  7 cm JVD, no thyromegally Lungs:  Clear with no wheezes, rales, or rhonchi. HEART:  Regular rate rhythm, no murmurs, no rubs, no clicks, S2 is split. Abd:  soft, positive bowel sounds, no organomegally, no rebound, no guarding Ext:  2 plus pulses, no edema, no cyanosis, no clubbing Skin:  No rashes no nodules Neuro:  CN II through XII intact, motor grossly intact  EKG - normal sinus rhythm with right bundle branch block, QRS duration 190 ms, PR interval 360 ms.   DEVICE  Normal device function.  See PaceArt for details.   Assess/Plan:

## 2012-12-19 NOTE — Assessment & Plan Note (Signed)
His Medtronic ICD has reached elective replacement. He has worsening conduction system disease, now with a PR interval of 360 ms, and a QRS duration of 190 ms. In addition, he has a Medtronic [id number] defibrillation lead. I've recommended that the patient undergo lead revision and upgrade from a single chamber device to a biventricular device as she will almost certainly develop complete heart block in the next year or 2. We'll schedule this as soon as possible.

## 2012-12-19 NOTE — Assessment & Plan Note (Signed)
The patient has no anginal symptoms. He will continue his current medical therapy.

## 2012-12-19 NOTE — Assessment & Plan Note (Signed)
His chronic systolic heart failure is class II, with an ejection fraction of 15-20%. He'll continue his current medications, and maintain a low-sodium diet.

## 2012-12-19 NOTE — Telephone Encounter (Signed)
Contacted [employer] at [phone] to follow up prior authorization request.  Request was denied, I inquired about an appeal and was transferred to at the appeals dept at [phone].  Appeal can be completed either over the phone by MD or by MD completing a form that can then be faxed back to [facility].  Form was requested, will inform pcp to make him aware.  Of note, it appears aggrenox  was removed from pt's medication list during office visit with Cardiologist,  so I will need to check with MD to see if pt is still suppose to be on this medication. Pt is scheduled for upcoming defibrillator generator change with an upgrade to a bi-ventricular device. Regenia Skeeter, [place] Pr[place] Cassady3/27/201412:21 PM

## 2012-12-19 NOTE — Patient Instructions (Addendum)
Your physician has recommended that you have a defibrillator generator change with an upgrade to a bi-ventricular device. Please see the instruction sheet given to you today for more information.

## 2012-12-20 NOTE — Telephone Encounter (Signed)
I reviewed the patient's record, and he was previously on aspirin plus clopidogrel several years ago, then was changed to Aggrenox.  His insurance has denied Aggrenox, but apparently will cover clopidogrel.  I tried reaching patient by phone, but was unable to reach him; he was last seen in our clinic in July 2013. I will forward this to Dr. Lovena Le who will see him on 12/25/2012 so he can make a decision about whether to restart clopidogrel with or without aspirin, given patient's history of coronary and carotid artery disease.

## 2012-12-24 MED ORDER — CEFAZOLIN SODIUM-DEXTROSE 2-3 GM-% IV SOLR
2.0000 g | INTRAVENOUS | Status: DC
  Filled 2012-12-24: qty 50

## 2012-12-24 MED ORDER — SODIUM CHLORIDE 0.9 % IR SOLN
80.0000 mg | Status: DC
  Filled 2012-12-24: qty 2

## 2012-12-25 ENCOUNTER — Encounter (HOSPITAL_COMMUNITY): Payer: Self-pay | Admitting: General Practice

## 2012-12-25 ENCOUNTER — Ambulatory Visit (HOSPITAL_COMMUNITY)
Admission: RE | Admit: 2012-12-25 | Discharge: 2012-12-26 | Disposition: A | Discharge status: Home / Self Care | Payer: Medicare HMO | Source: Ambulatory Visit | Attending: Internal Medicine | Admitting: Internal Medicine

## 2012-12-25 ENCOUNTER — Ambulatory Visit (HOSPITAL_COMMUNITY): Payer: Medicare HMO

## 2012-12-25 ENCOUNTER — Encounter (HOSPITAL_COMMUNITY)
Admission: RE | Disposition: A | Discharge status: Home / Self Care | Payer: Self-pay | Source: Ambulatory Visit | Attending: Internal Medicine

## 2012-12-25 DIAGNOSIS — I2589 Other forms of chronic ischemic heart disease: Secondary | ICD-10-CM

## 2012-12-25 DIAGNOSIS — I509 Heart failure, unspecified: Secondary | ICD-10-CM

## 2012-12-25 DIAGNOSIS — J302 Other seasonal allergic rhinitis: Secondary | ICD-10-CM

## 2012-12-25 DIAGNOSIS — I472 Ventricular tachycardia, unspecified: Secondary | ICD-10-CM | POA: Insufficient documentation

## 2012-12-25 DIAGNOSIS — Z4502 Encounter for adjustment and management of automatic implantable cardiac defibrillator: Secondary | ICD-10-CM | POA: Insufficient documentation

## 2012-12-25 DIAGNOSIS — Z95810 Presence of automatic (implantable) cardiac defibrillator: Secondary | ICD-10-CM

## 2012-12-25 DIAGNOSIS — E119 Type 2 diabetes mellitus without complications: Secondary | ICD-10-CM | POA: Insufficient documentation

## 2012-12-25 DIAGNOSIS — I4510 Unspecified right bundle-branch block: Secondary | ICD-10-CM | POA: Insufficient documentation

## 2012-12-25 DIAGNOSIS — I2510 Atherosclerotic heart disease of native coronary artery without angina pectoris: Secondary | ICD-10-CM | POA: Insufficient documentation

## 2012-12-25 DIAGNOSIS — M545 Low back pain: Secondary | ICD-10-CM

## 2012-12-25 DIAGNOSIS — N3941 Urge incontinence: Secondary | ICD-10-CM

## 2012-12-25 DIAGNOSIS — K5900 Constipation, unspecified: Secondary | ICD-10-CM

## 2012-12-25 DIAGNOSIS — H109 Unspecified conjunctivitis: Secondary | ICD-10-CM

## 2012-12-25 DIAGNOSIS — R972 Elevated prostate specific antigen [PSA]: Secondary | ICD-10-CM | POA: Insufficient documentation

## 2012-12-25 DIAGNOSIS — I255 Ischemic cardiomyopathy: Secondary | ICD-10-CM

## 2012-12-25 DIAGNOSIS — N529 Male erectile dysfunction, unspecified: Secondary | ICD-10-CM | POA: Insufficient documentation

## 2012-12-25 DIAGNOSIS — Z9861 Coronary angioplasty status: Secondary | ICD-10-CM | POA: Insufficient documentation

## 2012-12-25 DIAGNOSIS — I5022 Chronic systolic (congestive) heart failure: Secondary | ICD-10-CM | POA: Insufficient documentation

## 2012-12-25 DIAGNOSIS — D126 Benign neoplasm of colon, unspecified: Secondary | ICD-10-CM

## 2012-12-25 DIAGNOSIS — I4729 Other ventricular tachycardia: Secondary | ICD-10-CM | POA: Insufficient documentation

## 2012-12-25 DIAGNOSIS — I739 Peripheral vascular disease, unspecified: Secondary | ICD-10-CM | POA: Insufficient documentation

## 2012-12-25 DIAGNOSIS — Z45018 Encounter for adjustment and management of other part of cardiac pacemaker: Secondary | ICD-10-CM

## 2012-12-25 DIAGNOSIS — Z8673 Personal history of transient ischemic attack (TIA), and cerebral infarction without residual deficits: Secondary | ICD-10-CM | POA: Insufficient documentation

## 2012-12-25 DIAGNOSIS — I10 Essential (primary) hypertension: Secondary | ICD-10-CM | POA: Insufficient documentation

## 2012-12-25 DIAGNOSIS — E785 Hyperlipidemia, unspecified: Secondary | ICD-10-CM

## 2012-12-25 HISTORY — DX: Presence of automatic (implantable) cardiac defibrillator: Z95.810

## 2012-12-25 HISTORY — PX: BI-VENTRICULAR IMPLANTABLE CARDIOVERTER DEFIBRILLATOR UPGRADE: SHX5461

## 2012-12-25 HISTORY — DX: Type 2 diabetes mellitus without complications: E11.9

## 2012-12-25 HISTORY — PX: OTHER SURGICAL HISTORY: SHX169

## 2012-12-25 HISTORY — PX: LEAD REVISION: SHX5945

## 2012-12-25 HISTORY — DX: Acute myocardial infarction, unspecified: I21.9

## 2012-12-25 LAB — SURGICAL PCR SCREEN: Staphylococcus aureus: NEGATIVE

## 2012-12-25 LAB — GLUCOSE, CAPILLARY
Glucose-Capillary: 276 mg/dL — ABNORMAL HIGH (ref 70–99)
Glucose-Capillary: 172 mg/dL — ABNORMAL HIGH (ref 70–99)

## 2012-12-25 SURGERY — BI-VENTRICULAR IMPLANTABLE CARDIOVERTER DEFIBRILLATOR UPGRADE
Anesthesia: LOCAL

## 2012-12-25 MED ORDER — CHLORHEXIDINE GLUCONATE 4 % EX LIQD
60.0000 mL | Freq: Once | CUTANEOUS | Status: DC
  Filled 2012-12-25: qty 60

## 2012-12-25 MED ORDER — HYDRALAZINE HCL 25 MG PO TABS
25.0000 mg | ORAL_TABLET | Freq: Three times a day (TID) | ORAL | Status: DC
  Administered 2012-12-25 – 2012-12-26 (×2): 25 mg via ORAL
  Filled 2012-12-25 (×5): qty 1

## 2012-12-25 MED ORDER — ACCU-CHEK AVIVA PLUS W/DEVICE KIT: 1.0000 | PACK | Status: DC

## 2012-12-25 MED ORDER — INSULIN GLARGINE 100 UNIT/ML ~~LOC~~ SOLN
32.0000 [IU] | Freq: Every day | SUBCUTANEOUS | Status: DC
  Administered 2012-12-25: 32 [IU] via SUBCUTANEOUS
  Filled 2012-12-25 (×2): qty 0.32

## 2012-12-25 MED ORDER — MIDAZOLAM HCL 5 MG/5ML IJ SOLN
INTRAMUSCULAR | Status: AC
  Filled 2012-12-25: qty 5

## 2012-12-25 MED ORDER — CEFAZOLIN SODIUM-DEXTROSE 2-3 GM-% IV SOLR
2.0000 g | Freq: Four times a day (QID) | INTRAVENOUS | Status: AC
  Administered 2012-12-25 – 2012-12-26 (×3): 2 g via INTRAVENOUS
  Filled 2012-12-25 (×3): qty 50

## 2012-12-25 MED ORDER — HYDRALAZINE HCL 20 MG/ML IJ SOLN
10.0000 mg | Freq: Once | INTRAMUSCULAR | Status: AC | PRN
  Administered 2012-12-25: 19:00:00 10 mg via INTRAVENOUS
  Filled 2012-12-25: qty 1

## 2012-12-25 MED ORDER — SIMVASTATIN 20 MG PO TABS
20.0000 mg | ORAL_TABLET | Freq: Every day | ORAL | Status: DC
  Administered 2012-12-25: 20 mg via ORAL
  Filled 2012-12-25 (×2): qty 1

## 2012-12-25 MED ORDER — SODIUM CHLORIDE 0.9 % IV SOLN
INTRAVENOUS | Status: DC
  Administered 2012-12-25: 09:00:00 via INTRAVENOUS

## 2012-12-25 MED ORDER — ACETAMINOPHEN 325 MG PO TABS: 325.0000 mg | ORAL_TABLET | ORAL | Status: DC | PRN

## 2012-12-25 MED ORDER — FENTANYL CITRATE 0.05 MG/ML IJ SOLN
INTRAMUSCULAR | Status: AC
  Filled 2012-12-25: qty 2

## 2012-12-25 MED ORDER — HYDRALAZINE HCL 20 MG/ML IJ SOLN
10.0000 mg | Freq: Once | INTRAMUSCULAR | Status: AC
  Administered 2012-12-25: 10 mg via INTRAVENOUS

## 2012-12-25 MED ORDER — INSULIN ASPART 100 UNIT/ML ~~LOC~~ SOLN
0.0000 [IU] | Freq: Three times a day (TID) | SUBCUTANEOUS | Status: DC
Start: 2012-12-25 — End: 2012-12-26
  Administered 2012-12-25 – 2012-12-26 (×2): 5 [IU] via SUBCUTANEOUS

## 2012-12-25 MED ORDER — PANTOPRAZOLE SODIUM 20 MG PO TBEC
20.0000 mg | DELAYED_RELEASE_TABLET | Freq: Every day | ORAL | Status: DC
  Administered 2012-12-25 – 2012-12-26 (×2): 20 mg via ORAL
  Filled 2012-12-25 (×2): qty 1

## 2012-12-25 MED ORDER — ASPIRIN 81 MG PO TABS
81.0000 mg | ORAL_TABLET | Freq: Two times a day (BID) | ORAL | Status: DC
  Administered 2012-12-25: 81 mg via ORAL
  Filled 2012-12-25 (×5): qty 1

## 2012-12-25 MED ORDER — INSULIN ASPART 100 UNIT/ML ~~LOC~~ SOLN: 0.0000 [IU] | Freq: Three times a day (TID) | SUBCUTANEOUS | Status: DC

## 2012-12-25 MED ORDER — MUPIROCIN 2 % EX OINT
TOPICAL_OINTMENT | Freq: Two times a day (BID) | CUTANEOUS | Status: DC
  Administered 2012-12-25: 1 via NASAL
  Filled 2012-12-25 (×2): qty 22

## 2012-12-25 MED ORDER — ONDANSETRON HCL 4 MG/2ML IJ SOLN
4.0000 mg | Freq: Four times a day (QID) | INTRAMUSCULAR | Status: DC | PRN
  Administered 2012-12-25: 4 mg via INTRAVENOUS
  Filled 2012-12-25: qty 2

## 2012-12-25 MED ORDER — CARVEDILOL 25 MG PO TABS
25.0000 mg | ORAL_TABLET | Freq: Two times a day (BID) | ORAL | Status: DC
  Administered 2012-12-25 – 2012-12-26 (×2): 25 mg via ORAL
  Filled 2012-12-25 (×4): qty 1

## 2012-12-25 MED ORDER — CEFAZOLIN SODIUM-DEXTROSE 2-3 GM-% IV SOLR
INTRAVENOUS | Status: AC
  Filled 2012-12-25: qty 50

## 2012-12-25 MED ORDER — LIDOCAINE HCL (PF) 1 % IJ SOLN
INTRAMUSCULAR | Status: AC
  Filled 2012-12-25: qty 60

## 2012-12-25 NOTE — Interval H&P Note (Signed)
History and Physical Interval Note:  12/25/2012 10:55 AM  [name]  has presented today for surgery, with the diagnosis of asm/hf/eri  The various methods of treatment have been discussed with the patient and family. After consideration of risks, benefits and other options for treatment, the patient has consented to  Procedure(s): BI-VENTRICULAR IMPLANTABLE CARDIOVERTER DEFIBRILLATOR UPGRADE (N/A) LEAD REVISION (N/A) as a surgical intervention .  The patient's history has been reviewed, patient examined, no change in status, stable for surgery.  I have reviewed the patient's chart and labs.  Questions were answered to the patient's satisfaction.     Mikle Bosworth.D.

## 2012-12-25 NOTE — H&P (View-Only) (Signed)
HPI [name] returns today for followup. He is a very pleasant 79 year old man with a nonischemic cardio myopathy, chronic systolic heart failure, ejection fraction 30% by echo, status post ICD implantation. The patient has class II congestive heart failure symptoms. The patient's has worsening conduction in his AV node. He has had right bundle branch block for many years, but in the last couple of years, his PR interval has increased, now to over 360 ms. In addition he has bundle branch block, with a QRS duration of 190 ms. He has not had syncope. No recent ICD shocks. No peripheral edema. No chest pain. Not on File   Current Outpatient Prescriptions  Medication Sig Dispense Refill  . aspirin 81 MG tablet Take 81 mg by mouth daily.      . Blood Glucose Monitoring Suppl (ACCU-CHEK AVIVA PLUS) W/DEVICE KIT 1 kit by Does not apply route as directed.  1 kit  0  . carvedilol (COREG) 25 MG tablet Take 1 tablet (25 mg total) by mouth 2 (two) times daily with a meal.  60 tablet  6  . Insulin Pen Needle 31G X 6 MM MISC Use as directed.  100 each  3  . pantoprazole (PROTONIX) 40 MG tablet TAKE ONE TABLET BY MOUTH ONE TIME DAILY  30 tablet  9  . simvastatin (ZOCOR) 40 MG tablet TAKE ONE TABLET BY MOUTH ONE TIME DAILY  30 tablet  9   No current facility-administered medications for this visit.     Past Medical History  Diagnosis Date  . Anemia   . Cataracts, bilateral   . CHF (congestive heart failure)     ischemic CM EF 15-20% s/p AICD 05/24/04  . CAD (coronary artery disease)     s/p AMI, s/p PTCA & stent of cx 12/04 restent 5/05  . Diabetes mellitus     type 2  . Diverticulosis of colon   . Hyperlipidemia   . PVD (peripheral vascular disease)     s/p L carotid PTCA/stent 2004  . Transient ischemic attack   . Erectile dysfunction   . Hyperkalemia 08/2008    K=5.7   . BBB (bundle branch block)     right  . HTN (hypertension)   . Elevated PSA   . Adenomatous colon polyp 02/14/2012     ROS:   All systems reviewed and negative except as noted in the HPI.   Past Surgical History  Procedure Laterality Date  . Cardiac catheterization  06/2003,  01/2004  . Cardiac defibrillator placement  05/24/2004    Implantation of a single-chamber defibrillator  . Carotid stent  09/11/2003    Percutaneous transluminal angioplasty and stent placement of the left internal carotid artery.     Family History  Problem Relation Age of Onset  . Diabetes Mother   . Diabetes Brother      History   Social History  . Marital Status: Widowed    Spouse Name: N/A    Number of Children: N/A  . Years of Education: N/A   Occupational History  . Not on file.   Social History Main Topics  . Smoking status: Former Smoker    Types: Cigarettes    Quit date: 12/27/1967  . Smokeless tobacco: Never Used  . Alcohol Use: No  . Drug Use: No  . Sexually Active: Not on file   Other Topics Concern  . Not on file   Social History Narrative   Single, 2 adult children, daughter in [place], son  in [place]     BP 139/79  Pulse 68  Ht 5\' 11"  (1.803 m)  Wt 185 lb (83.915 kg)  BMI 25.81 kg/m2  Physical Exam:  Well appearing 78 year old man,NAD HEENT: Unremarkable Neck:  7 cm JVD, no thyromegally Lungs:  Clear with no wheezes, rales, or rhonchi. HEART:  Regular rate rhythm, no murmurs, no rubs, no clicks, S2 is split. Abd:  soft, positive bowel sounds, no organomegally, no rebound, no guarding Ext:  2 plus pulses, no edema, no cyanosis, no clubbing Skin:  No rashes no nodules Neuro:  CN II through XII intact, motor grossly intact  EKG - normal sinus rhythm with right bundle branch block, QRS duration 190 ms, PR interval 360 ms.   DEVICE  Normal device function.  See PaceArt for details.   Assess/Plan:

## 2012-12-25 NOTE — Progress Notes (Signed)
Patient remains stable, in no distress.  Portable chest X ray done; results posted, Fellow notified.

## 2012-12-25 NOTE — Progress Notes (Signed)
Dr. Verl Blalock notified of vibrating sensation to left abd of patient who is post pacer. Dr. Verl Blalock came to assess patient feels it is associated with pacer vibrating diaphragm. Patient is stable and denies pain. Portable xray ordered to check lead placement. Dr. Verl Blalock will be notified for any changes patient will continue to be monitored.

## 2012-12-25 NOTE — Op Note (Signed)
BiV ICD upgrade without immediate procedure complication. [id number].

## 2012-12-26 ENCOUNTER — Encounter (HOSPITAL_COMMUNITY): Payer: Self-pay | Admitting: *Deleted

## 2012-12-26 DIAGNOSIS — Z95810 Presence of automatic (implantable) cardiac defibrillator: Secondary | ICD-10-CM

## 2012-12-26 DIAGNOSIS — I2589 Other forms of chronic ischemic heart disease: Secondary | ICD-10-CM

## 2012-12-26 LAB — GLUCOSE, CAPILLARY: Glucose-Capillary: 257 mg/dL — ABNORMAL HIGH (ref 70–99)

## 2012-12-26 NOTE — Progress Notes (Signed)
   ELECTROPHYSIOLOGY ROUNDING NOTE    Patient Name: [name] Date of Encounter: 12-26-2012    SUBJECTIVE:Patient feels well.  No chest pain or shortness of breath.  No incisional soreness.  S/p CRTD upgrade 12-25-2012.  Patient with some diaphragmatic stim last night-- resolved without intervention.  CXR yesterday demonstrated good position of LV lead.  TELEMETRY: Reviewed telemetry pt in sinus rhythm with CRT pacing Filed Vitals:   12/25/12 1952 12/26/12 0000 12/26/12 0029 12/26/12 0532  BP: 167/61  131/50 133/62  Pulse: 72 70 69 71  Temp: 97.3 F (36.3 C)  98.3 F (36.8 C) 98.8 F (37.1 C)  TempSrc: Oral  Oral Oral  Height:      Weight:      SpO2: 99% 96% 97% 98%    Intake/Output Summary (Last 24 hours) at 12/26/12 0541 Last data filed at 12/26/12 0533  Gross per 24 hour  Intake    530 ml  Output    720 ml  Net   -190 ml     Radiology/Studies:  Dg Chest Port 1 View 12/25/2012  *RADIOLOGY REPORT*  Clinical Data: New pacer leads.  PORTABLE CHEST - 1 VIEW  Comparison: Two-view chest 10/14/2012.  Findings: The heart is enlarged.  Two additional pacer leads and an additional AICD are in place.  There is no pneumothorax.  A new generator is in place.  Aeration at the right lung base is improved.  There is increased airspace disease in the left.  The heart is enlarged.  Mild pulmonary vascular congestion is evident.  IMPRESSION:  1.  Interval placement of additional pacing leads. 2.  No pneumothorax. 3.  Stable cardiomegaly. 4.  Mild pulmonary vascular congestion. 5.  New left lower lobe airspace disease likely reflects atelectasis. 6.  Improved aeration at the right lung base.   Original Report Authenticated By: San Morelle, M.D.     PHYSICAL EXAM Left chest without hematoma/ecchymosis  DEVICE INTERROGATION: Device interrogation - demonstrates normal Biv ICD function  Routine follow up scheduled.  Wound care, arm restrictions, activity reviewed with patient.     A/P 1. Chronic systolic CHF 2. ICM, EF 25% 3. S/p BiV ICD upgrade  Rec: device is working normally. [place] for discharge. Will plan usual followup.   Mikle Bosworth.D.

## 2012-12-26 NOTE — Op Note (Signed)
[name], [name]              ACCOUNT NO.:  [id number]  MEDICAL RECORD NO.:  [id number]  LOCATION:  6526                         FACILITY:  [facility]  PHYSICIAN:  Champ Mungo. Lovena Le, MD    DATE OF BIRTH:  1930-10-14  DATE OF PROCEDURE:  12/25/2012 DATE OF DISCHARGE:                              OPERATIVE REPORT   PROCEDURE PERFORMED:  Upgrade of a single-chamber ICD which had a 6949 defibrillation lead to a biventricular ICD all in the setting of ventricular tachycardia, ischemic cardiomyopathy, chronic systolic heart failure and bundle-branch block with a QRS duration of 175 milliseconds.  INTRODUCTION:  The patient is a very pleasant 79 year old man with a longstanding ischemic cardiomyopathy, he has had worsening heart failure symptoms as well as worsening conduction system disease.  The PR interval was over 300 milliseconds and his QRS duration is 175 milliseconds.  He is now referred for upgrade of his single-chamber ICD to a biventricular ICD.  PROCEDURE:  After informed consent was obtained, the patient was taken to the diagnostic EP lab in a fasting state.  After usual preparation and draping, intravenous fentanyl and midazolam was given for sedation. A 30 mL of lidocaine was infiltrated into the left infraclavicular region.  Prior to this, 10 mL of contrast demonstrated that the left subclavian vein was in fact patent.  The vein was then punctured x3. The Medtronic [id number] 62-cm active fixation defibrillation lead, serial [id number] was advanced into the right ventricle and the Medtronic model 5076 52-cm active fixation pacing lead, serial #[id number] was advanced to the right atrium.  Mapping was carried out in the right ventricle. At the final site, the R-waves measured 17 mV and the pacing impedance with the lead actively fixed to 1000 ohms.  Pacing threshold was a volt at 0.5 milliseconds.  With the right ventricular lead in satisfactory position, attention was then  turned to the atrial lead which was placed in anterolateral portion of the right atrium.  The P-waves measured 4 mV, the pacing impedance was 600 ohms and threshold 0.9 V at 0.5 milliseconds.  With both the atrial and the RV leads in satisfactory position, attention was then turned to place a left ventricular lead. The Medtronic MB2 guiding catheter and a 6-French hexapolar EP catheter were inserted into the right atrium by way of the left subclavian vein. The vein was cannulated without difficulty and venography of the vein was carried out which demonstrated a large posterior vein and very very low __________satisfactory veins.  The Medtronic model 4194 88 cm bipolar pacing lead was advanced over an angioplasty 0.014 guidewire into the posterior vein to a lateral branch.  In this location, there was no diaphragmatic stimulation, and the pacing threshold was around 1.5 V at 0.5 milliseconds with a pacing impedance of 1200 ohms.  A 10 V pacing did not stimulate the diaphragm.  With these satisfactory parameters, the LV lead was liberated from the guiding catheter in the usual manner.  The leads were then secured to the subpectoral fascia with a figure-of-eight silk suture and the sewing sleeve was secured with silk suture.  Electrocautery was utilized to make a subcutaneous pocket.  Antibiotic irrigation was utilized to  irrigate the pocket. Electrocautery was utilized to assure hemostasis.  The old device was removed.  The old pocket was opened, and the leads were freed up from the fibrous adhesions.  The pocket was irrigated with antibiotic irrigation and the old Medtronic [id number] defibrillation lead was capped. The new Medtronic Viva XT CRT-D, BiV ICD, serial # [id number] was connected to the new atrial and LV leads and the new LV lead all placed back in the subcutaneous pocket.  The old RV lead was capped as previously noted.  The pocket was irrigated with additional  antibiotic irrigation.  The incision was closed with 2-0 and 3-0 Vicryl.  At this point __________ defibrillation threshold testing.  The patient was deeply sedated under my direct supervision with intravenous __________ was induced with a T-wave shock.  A 20-joule shock was utilized to terminate ventricular fibrillation and restore sinus rhythm.  At this point, no additional DFT testing was carried out. Benzoin and Steri-Strips were painted on the skin.  A pressure dressing was applied.  The patient was returned to his room in satisfactory condition.  COMPLICATIONS:  There were no immediate procedure complications.  RESULTS:  Demonstrate successful implantation of a Medtronic biventricular ICD system with capping of the old Medtronic ICD and removal of the old ICD generator, which had reached elective replacement.     Champ Mungo. Lovena Le, MD     GWT/MEDQ  D:  12/25/2012  T:  12/26/2012  Job:  [id number]

## 2012-12-26 NOTE — Discharge Summary (Signed)
ELECTROPHYSIOLOGY PROCEDURE DISCHARGE SUMMARY    Patient ID: [name],  MRN: [id number], DOB/AGE: 1930-09-30 79 y.o.  Admit date: 12/25/2012 Discharge date: 12/26/2012  Primary Care Physician: [name], MD Electrophysiologist: Cristopher Peru, MD  Primary Discharge Diagnosis:  Ischemic cardiomyopathy with previously implanted ICD now at elective replacement indicator and worsening conduction disease s/p CRTD upgrade this admission  Secondary Discharge Diagnosis:  1.  Coronary artery disease s/p AMI, PTCA and stent to cx 12/04 with restent in 5/05 2.  Type II diabetes 3.  Hyperlipidemia 4.  PVD s/p L carotid PTCA/stent 2004 5.  TIA 6.  Erectile dysfunction 7.  Hypertension 8.  Elevated PSA  Procedures This Admission:  1.  CRTD upgrade of previously implanted single chamber defibrillator by Dr Lovena Le on 12-25-2012.  See dictated report for full details.  The patient received a Medtronic device.  There were no early apparent complications. 2.  CXR on 12-25-2012 demonstrated no pneumothorax post device upgrade.   Brief HPI: [name] returns today for followup. He is a very pleasant 79 year old man with a nonischemic cardiomyopathy, chronic systolic heart failure, ejection fraction 30% by echo, status post ICD implantation. The patient has class II congestive heart failure symptoms. The patient's has worsening conduction in his AV node. He has had right bundle branch block for many years, but in the last couple of years, his PR interval has increased, now to over 360 ms. In addition he has bundle branch block, with a QRS duration of 190 ms. Because of this, CRTD upgrade was recommended. Risks, benefits, and alternatives were discussed with the patient who wished to proceed.  Hospital Course:  The patient was admitted and underwent upgrade of his previously implanted ICD to a CRTD with details as outlined above.   He was monitored on telemetry overnight which demonstrated sinus  rhythm with ventricular pacing.  Left chest was without hematoma or ecchymosis.  The device was interrogated and found to be functioning normally.  CXR was obtained and demonstrated no pneumothorax status post device implantation.  Wound care, arm mobility, and restrictions were reviewed with the patient.  Dr Lovena Le examined the patient and considered them stable for discharge to home.   Discharge Vitals: Blood pressure 144/64, pulse 71, temperature 99 F (37.2 C), temperature source Oral, resp. rate 20, height 5\' 11"  (1.803 m), weight 175 lb 14.8 oz (79.8 kg), SpO2 95.00%.    Labs:   Lab Results  Component Value Date   WBC 7.8 12/19/2012   HGB 13.9 12/19/2012   HCT 42.8 12/19/2012   MCV 95.9 12/19/2012   PLT 148.0* 12/19/2012     Recent Labs Lab 12/19/12 1004  NA 134*  K 4.5  CL 101  CO2 26  BUN 20  CREATININE 1.5  CALCIUM 9.2  GLUCOSE 230*    Discharge Medications:    Medication List    TAKE these medications       ACCU-CHEK AVIVA PLUS W/DEVICE Kit  1 kit by Does not apply route as directed.     aspirin 81 MG tablet  Take 81 mg by mouth 2 (two) times daily.     carvedilol 25 MG tablet  Commonly known as:  COREG  Take 1 tablet (25 mg total) by mouth 2 (two) times daily with a meal.     insulin glargine 100 UNIT/ML injection  Commonly known as:  LANTUS  Inject 32 Units into the skin at bedtime.     Insulin Pen Needle 31G X  6 MM Misc  Use as directed.     pantoprazole 40 MG tablet  Commonly known as:  PROTONIX  TAKE ONE TABLET BY MOUTH ONE TIME DAILY     simvastatin 40 MG tablet  Commonly known as:  ZOCOR  TAKE ONE TABLET BY MOUTH ONE TIME DAILY        Disposition:  Discharge Orders   Future Appointments Provider Department Dept Phone   01/06/2013 12:00 PM [facility] [place]) [phone]   02/19/2013 9:15 AM Axel Filler, MD [place] [phone]   Future Orders Complete By Expires       Diet - low sodium heart healthy  As directed     Discharge instructions  As directed     Comments:      Please see post BiV ICD implant discharge instructions    Increase activity slowly  As directed       Follow-up Information   Follow up with [facility] On 01/06/2013. (At 12:00 noon for wound check)    Contact information:   [address] [place] [address] [phone]      Follow up with Cristopher Peru, MD In 3 months. (Our office will mail a reminder letter)    Contact information:   [address] [place] [address] [phone]      Duration of Discharge Encounter: Greater than 30 minutes including physician time.  Signed, Chanetta Marshall, RN, BSN 12/26/2012, 8:46 AM

## 2012-12-27 ENCOUNTER — Emergency Department (HOSPITAL_COMMUNITY)
Admission: EM | Admit: 2012-12-27 | Discharge: 2012-12-27 | Disposition: A | Discharge status: Home / Self Care | Payer: Medicare HMO | Attending: Emergency Medicine | Admitting: Emergency Medicine

## 2012-12-27 ENCOUNTER — Other Ambulatory Visit: Payer: Self-pay

## 2012-12-27 ENCOUNTER — Encounter (HOSPITAL_COMMUNITY): Payer: Self-pay | Admitting: Emergency Medicine

## 2012-12-27 ENCOUNTER — Telehealth: Payer: Self-pay | Admitting: *Deleted

## 2012-12-27 ENCOUNTER — Emergency Department (HOSPITAL_COMMUNITY): Payer: Medicare HMO

## 2012-12-27 DIAGNOSIS — R011 Cardiac murmur, unspecified: Secondary | ICD-10-CM | POA: Insufficient documentation

## 2012-12-27 DIAGNOSIS — Z95810 Presence of automatic (implantable) cardiac defibrillator: Secondary | ICD-10-CM

## 2012-12-27 DIAGNOSIS — Z862 Personal history of diseases of the blood and blood-forming organs and certain disorders involving the immune mechanism: Secondary | ICD-10-CM | POA: Insufficient documentation

## 2012-12-27 DIAGNOSIS — I2510 Atherosclerotic heart disease of native coronary artery without angina pectoris: Secondary | ICD-10-CM | POA: Insufficient documentation

## 2012-12-27 DIAGNOSIS — Z860100 Personal history of colon polyps, unspecified: Secondary | ICD-10-CM | POA: Insufficient documentation

## 2012-12-27 DIAGNOSIS — I429 Cardiomyopathy, unspecified: Secondary | ICD-10-CM

## 2012-12-27 DIAGNOSIS — R55 Syncope and collapse: Secondary | ICD-10-CM

## 2012-12-27 DIAGNOSIS — Z8673 Personal history of transient ischemic attack (TIA), and cerebral infarction without residual deficits: Secondary | ICD-10-CM | POA: Insufficient documentation

## 2012-12-27 DIAGNOSIS — Z7982 Long term (current) use of aspirin: Secondary | ICD-10-CM | POA: Insufficient documentation

## 2012-12-27 DIAGNOSIS — Z87891 Personal history of nicotine dependence: Secondary | ICD-10-CM | POA: Insufficient documentation

## 2012-12-27 DIAGNOSIS — Z8639 Personal history of other endocrine, nutritional and metabolic disease: Secondary | ICD-10-CM | POA: Insufficient documentation

## 2012-12-27 DIAGNOSIS — Z950 Presence of cardiac pacemaker: Secondary | ICD-10-CM | POA: Insufficient documentation

## 2012-12-27 DIAGNOSIS — Z79899 Other long term (current) drug therapy: Secondary | ICD-10-CM | POA: Insufficient documentation

## 2012-12-27 DIAGNOSIS — R42 Dizziness and giddiness: Secondary | ICD-10-CM | POA: Insufficient documentation

## 2012-12-27 DIAGNOSIS — Z8679 Personal history of other diseases of the circulatory system: Secondary | ICD-10-CM | POA: Insufficient documentation

## 2012-12-27 DIAGNOSIS — Z8739 Personal history of other diseases of the musculoskeletal system and connective tissue: Secondary | ICD-10-CM | POA: Insufficient documentation

## 2012-12-27 DIAGNOSIS — I428 Other cardiomyopathies: Secondary | ICD-10-CM | POA: Insufficient documentation

## 2012-12-27 DIAGNOSIS — Z8669 Personal history of other diseases of the nervous system and sense organs: Secondary | ICD-10-CM | POA: Insufficient documentation

## 2012-12-27 DIAGNOSIS — E785 Hyperlipidemia, unspecified: Secondary | ICD-10-CM | POA: Insufficient documentation

## 2012-12-27 DIAGNOSIS — E119 Type 2 diabetes mellitus without complications: Secondary | ICD-10-CM | POA: Insufficient documentation

## 2012-12-27 DIAGNOSIS — I252 Old myocardial infarction: Secondary | ICD-10-CM | POA: Insufficient documentation

## 2012-12-27 DIAGNOSIS — Z87448 Personal history of other diseases of urinary system: Secondary | ICD-10-CM | POA: Insufficient documentation

## 2012-12-27 DIAGNOSIS — Z794 Long term (current) use of insulin: Secondary | ICD-10-CM | POA: Insufficient documentation

## 2012-12-27 DIAGNOSIS — I10 Essential (primary) hypertension: Secondary | ICD-10-CM | POA: Insufficient documentation

## 2012-12-27 DIAGNOSIS — Z8719 Personal history of other diseases of the digestive system: Secondary | ICD-10-CM | POA: Insufficient documentation

## 2012-12-27 DIAGNOSIS — I509 Heart failure, unspecified: Secondary | ICD-10-CM | POA: Insufficient documentation

## 2012-12-27 LAB — POCT I-STAT, CHEM 8
BUN: 23 mg/dL (ref 6–23)
Calcium, Ion: 1.25 mmol/L (ref 1.13–1.30)
Chloride: 103 mEq/L (ref 96–112)
Glucose, Bld: 225 mg/dL — ABNORMAL HIGH (ref 70–99)

## 2012-12-27 LAB — URINALYSIS, ROUTINE W REFLEX MICROSCOPIC
Ketones, ur: 15 mg/dL — AB
Nitrite: NEGATIVE
Urobilinogen, UA: 1 mg/dL (ref 0.0–1.0)

## 2012-12-27 LAB — GLUCOSE, CAPILLARY: Glucose-Capillary: 233 mg/dL — ABNORMAL HIGH (ref 70–99)

## 2012-12-27 LAB — URINE MICROSCOPIC-ADD ON

## 2012-12-27 LAB — CBC WITH DIFFERENTIAL/PLATELET
Basophils Absolute: 0 10*3/uL (ref 0.0–0.1)
Basophils Relative: 0 % (ref 0–1)
Eosinophils Absolute: 0.2 10*3/uL (ref 0.0–0.7)
Eosinophils Relative: 2 % (ref 0–5)
HCT: 43.2 % (ref 39.0–52.0)
MCHC: 33.8 g/dL (ref 30.0–36.0)
MCV: 91.7 fL (ref 78.0–100.0)
Monocytes Absolute: 1.6 10*3/uL — ABNORMAL HIGH (ref 0.1–1.0)
RDW: 13.5 % (ref 11.5–15.5)

## 2012-12-27 NOTE — ED Notes (Signed)
Per EMS pt was walking across the street and had sudden onset of dizziness. Wife reports seeing pt eyes roll back in head. No LOC. Pt denies pain. Pt was released yesterday for defibrillator placement update. VSS at this time.

## 2012-12-27 NOTE — ED Notes (Signed)
Pt given urinal and asked for a urine sample.

## 2012-12-27 NOTE — ED Notes (Signed)
Pt sts he recently had a new ICD placed a few days ago. sts today he had just got done walking 40 mins and then finally sat down at the bus stop and suddenly felt dizzy and felt like he was going to pass out. Pt denies LOC/hitting head. Pt's friend sts that she saw his eye go back into his head. Pt sts he remembers the entire thing. Pt denies CP/SOB/n/v prior to and after the episode. Pt in nad, skin warm and dry, resp e/u.

## 2012-12-27 NOTE — Consult Note (Signed)
Reason for Consult:near syncope  Referring Physician: ER MD  [name] is an 79 y.o. male.   HPI: The patient is a 79 yo man with a long h/o ICM, chronic systolic CHF, high grade heart block, s/p BiV ICD insertion. He was discharged yesterday and was in his usual state of health until earlier today when he was riding the bus with his friend. He became nauseated and diaphoretic. He did not lose consciousness but did feel very weak and tired. He denies palpitations or ICD shock. He was brought to the hospital where his ECG, CXR and labs are all unremarkable. He now feels well.   PMH: Past Medical History  Diagnosis Date  . Anemia   . Cataracts, bilateral   . CHF (congestive heart failure)     ischemic CM EF 15-20% s/p AICD 05/24/04  . CAD (coronary artery disease)     s/p AMI, s/p PTCA & stent of cx 12/04 restent 5/05  . Diverticulosis of colon   . Hyperlipidemia   . PVD (peripheral vascular disease)     s/p L carotid PTCA/stent 2004  . Transient ischemic attack   . Erectile dysfunction   . Hyperkalemia 08/2008    K=5.7   . BBB (bundle branch block)     right  . HTN (hypertension)   . Elevated PSA   . Adenomatous colon polyp 02/14/2012  . ICD (implantable cardiac defibrillator) in place 12-25-2012    MDT CRTD upgrade by Dr Lovena Le  . Pacemaker   . Type II diabetes mellitus   . Myocardial infarction 1990  . Arthritis     "right hip" (12/25/2012)    PSHX: Past Surgical History  Procedure Laterality Date  . Cardiac defibrillator placement  05/24/2004    Implantation of a MDT single-chamber defibrillator  . Carotid stent  09/11/2003    Percutaneous transluminal angioplasty and stent placement of the left internal carotid artery.  . Biv icd upgrade  12/25/2012    MDT CRTD upgrade by Dr Lovena Le for ischemic cardiomyopathy and worsening conduction system disease  . Cataract extraction w/ intraocular lens  implant, bilateral Bilateral ~ 2010  . Cardiac catheterization  06/2003,   01/2004  . Coronary angioplasty with stent placement  1990    "2" (12/25/2012)    FAMHX: Family History  Problem Relation Age of Onset  . Diabetes Mother   . Diabetes Brother     Social History:  reports that he quit smoking about 45 years ago. His smoking use included Cigarettes. He smoked 0.00 packs per day. He has never used smokeless tobacco. He reports that he does not drink alcohol or use illicit drugs.  Allergies: No Known Allergies  Medications: reviewed  Dg Chest Port 1 View  12/27/2012  *RADIOLOGY REPORT*  Clinical Data: Near-syncope.  PORTABLE CHEST - 1 VIEW  Comparison: One-view chest 12/25/2012.  Findings: Pacing wires are stable.  Cardiac enlargement is unchanged.  Overall aeration is slightly improved.  Retrocardiac opacification persists.  IMPRESSION:  1.  Stable cardiomegaly. 2.  Slight improved aeration. 3.  Mild retrocardiac opacification likely reflects atelectasis.   Original Report Authenticated By: San Morelle, M.D.    Dg Chest Port 1 View  12/25/2012  *RADIOLOGY REPORT*  Clinical Data: New pacer leads.  PORTABLE CHEST - 1 VIEW  Comparison: Two-view chest 10/14/2012.  Findings: The heart is enlarged.  Two additional pacer leads and an additional AICD are in place.  There is no pneumothorax.  A new generator is in place.  Aeration at the right lung base is improved.  There is increased airspace disease in the left.  The heart is enlarged.  Mild pulmonary vascular congestion is evident.  IMPRESSION:  1.  Interval placement of additional pacing leads. 2.  No pneumothorax. 3.  Stable cardiomegaly. 4.  Mild pulmonary vascular congestion. 5.  New left lower lobe airspace disease likely reflects atelectasis. 6.  Improved aeration at the right lung base.   Original Report Authenticated By: San Morelle, M.D.     ROS  As stated in the HPI and negative for all other systems.  Physical Exam  Vitals:Blood pressure 153/68, pulse 65, temperature 97.2 F (36.2 C),  temperature source Oral, resp. rate 20, SpO2 99.00%.  Well appearing 79 yo man, NAD HEENT: Unremarkable Neck:  7 cm JVD, no thyromegally Lungs:  Clear with no wheezes, rales or rhonchi HEART:  Regular rate rhythm, no murmurs, no rubs, no clicks Abd:  Flat, positive bowel sounds, no organomegally, no rebound, no guarding Ext:  2 plus pulses, no edema, no cyanosis, no clubbing Skin:  No rashes no nodules Neuro:  CN II through XII intact, motor grossly intact   ICD interogation carried out under my guidance which demonstrates no arrhythmias and normal LV, RV and RA function.  Assessment/Plan: 1. Episode of nausea and diaphoresis without chest pain or sob. I suspect he had a vagal episode. I have reviewed all of his records and will allow him to be discharged with usual EP followup of his new BiV ICD. He does abuse sodium and I have asked him to eat a low sodium diet. His activity should be limited to no strenuous exertion.  Carleene Overlie TaylorMD 12/27/2012, 5:54 PM

## 2012-12-27 NOTE — ED Notes (Signed)
Radiology at bedside

## 2012-12-27 NOTE — ED Provider Notes (Signed)
History     CSN: [id number]  Arrival date & time 12/27/12  1334   First MD Initiated Contact with Patient 12/27/12 1407      Chief Complaint  Patient presents with  . Near Syncope    (Consider location/radiation/quality/duration/timing/severity/associated sxs/prior treatment) HPI Comments: 79 year old man with a nonischemic cardio myopathy, chronic systolic heart failure, ejection fraction 25% by echo, status post ICD implantation just 2 days ago Comes in with cc of near syncope. Pt states that he was sitting on a bus seat, felt dizzy, and then nearly blacked out. He never had a LOC, but wife who was right by him, states that patient's eyes rolled back, and he just appeared "out of it" There was no shaking, there was no incontinence, there was no confusion and no hx of seizure disorder. No hx of PE, DVT. Denies any chest pain, dib, palpitations or any prodromal symptoms.  The history is provided by the patient and medical records.    Past Medical History  Diagnosis Date  . Anemia   . Cataracts, bilateral   . CHF (congestive heart failure)     ischemic CM EF 15-20% s/p AICD 05/24/04  . CAD (coronary artery disease)     s/p AMI, s/p PTCA & stent of cx 12/04 restent 5/05  . Diverticulosis of colon   . Hyperlipidemia   . PVD (peripheral vascular disease)     s/p L carotid PTCA/stent 2004  . Transient ischemic attack   . Erectile dysfunction   . Hyperkalemia 08/2008    K=5.7   . BBB (bundle branch block)     right  . HTN (hypertension)   . Elevated PSA   . Adenomatous colon polyp 02/14/2012  . ICD (implantable cardiac defibrillator) in place 12-25-2012    MDT CRTD upgrade by Dr Lovena Le  . Pacemaker   . Type II diabetes mellitus   . Myocardial infarction 1990  . Arthritis     "right hip" (12/25/2012)    Past Surgical History  Procedure Laterality Date  . Cardiac defibrillator placement  05/24/2004    Implantation of a MDT single-chamber defibrillator  . Carotid stent   09/11/2003    Percutaneous transluminal angioplasty and stent placement of the left internal carotid artery.  . Biv icd upgrade  12/25/2012    MDT CRTD upgrade by Dr Lovena Le for ischemic cardiomyopathy and worsening conduction system disease  . Cataract extraction w/ intraocular lens  implant, bilateral Bilateral ~ 2010  . Cardiac catheterization  06/2003,  01/2004  . Coronary angioplasty with stent placement  1990    "2" (12/25/2012)    Family History  Problem Relation Age of Onset  . Diabetes Mother   . Diabetes Brother     History  Substance Use Topics  . Smoking status: Former Smoker    Types: Cigarettes    Quit date: 12/27/1967  . Smokeless tobacco: Never Used  . Alcohol Use: No     Comment: 12/25/2012 "quit all alcohol 60 yr ago"      Review of Systems  Constitutional: Negative for activity change and appetite change.  Respiratory: Negative for cough and shortness of breath.   Cardiovascular: Negative for chest pain.  Gastrointestinal: Negative for abdominal pain.  Genitourinary: Negative for dysuria.  Neurological: Positive for dizziness and light-headedness. Negative for seizures.  Psychiatric/Behavioral: Negative for confusion.    Allergies  Review of patient's allergies indicates no known allergies.  Home Medications   Current Outpatient Rx  Name  Route  Sig  Dispense  Refill  . aspirin 81 MG tablet   Oral   Take 81 mg by mouth 2 (two) times daily.          . Blood Glucose Monitoring Suppl (ACCU-CHEK AVIVA PLUS) W/DEVICE KIT   Does not apply   1 kit by Does not apply route as directed.   1 kit   0   . carvedilol (COREG) 25 MG tablet   Oral   Take 1 tablet (25 mg total) by mouth 2 (two) times daily with a meal.   60 tablet   6   . insulin glargine (LANTUS) 100 UNIT/ML injection   Subcutaneous   Inject 32 Units into the skin at bedtime.         . Insulin Pen Needle 31G X 6 MM MISC      Use as directed.   100 each   3   . pantoprazole  (PROTONIX) 40 MG tablet      TAKE ONE TABLET BY MOUTH ONE TIME DAILY   30 tablet   9   . simvastatin (ZOCOR) 40 MG tablet      TAKE ONE TABLET BY MOUTH ONE TIME DAILY   30 tablet   9     BP 127/61  Pulse 63  Temp(Src) 97.2 F (36.2 C) (Oral)  Resp 16  SpO2 98%  Physical Exam  Nursing note and vitals reviewed. Constitutional: He is oriented to person, place, and time. He appears well-developed.  HENT:  Head: Normocephalic and atraumatic.  Eyes: Conjunctivae and EOM are normal. Pupils are equal, round, and reactive to light.  Neck: Normal range of motion. Neck supple.  Cardiovascular: Normal rate and regular rhythm.   Murmur heard. Pulmonary/Chest: Effort normal and breath sounds normal. He exhibits tenderness.  Left sided icd insertion, with mild tenderness  Abdominal: Soft. Bowel sounds are normal. He exhibits no distension. There is no tenderness. There is no rebound and no guarding.  Neurological: He is alert and oriented to person, place, and time.  Skin: Skin is warm.    ED Course  Procedures (including critical care time)  Labs Reviewed - No data to display Dg Chest [facility] 1 View  12/25/2012  *RADIOLOGY REPORT*  Clinical Data: New pacer leads.  PORTABLE CHEST - 1 VIEW  Comparison: Two-view chest 10/14/2012.  Findings: The heart is enlarged.  Two additional pacer leads and an additional AICD are in place.  There is no pneumothorax.  A new generator is in place.  Aeration at the right lung base is improved.  There is increased airspace disease in the left.  The heart is enlarged.  Mild pulmonary vascular congestion is evident.  IMPRESSION:  1.  Interval placement of additional pacing leads. 2.  No pneumothorax. 3.  Stable cardiomegaly. 4.  Mild pulmonary vascular congestion. 5.  New left lower lobe airspace disease likely reflects atelectasis. 6.  Improved aeration at the right lung base.   Original Report Authenticated By: San Morelle, M.D.      No diagnosis  found.    MDM   Date: 12/27/2012  Rate: 63  Rhythm: sinus rhythm and atrial paced  QRS Axis: normal  Intervals: normal  ST/T Wave abnormalities: normal  Conduction Disutrbances:none  Narrative Interpretation:   Old EKG Reviewed: unchanged  Pt comes in with cc of - what appears to be near syncope. Has significant Cardiomyopathy - EF - 25%. Clinically, doesn't appear to be a seizure. EKG is unchanged. No PE hx, no anemia.  SF syncope score is positive  - CHF. Will admit.    Varney Biles, MD 12/27/12 [phone]

## 2012-12-27 NOTE — Telephone Encounter (Signed)
TCM patient. Left message to call back.

## 2012-12-30 NOTE — Telephone Encounter (Signed)
Left message for call back.

## 2013-01-06 ENCOUNTER — Ambulatory Visit (INDEPENDENT_AMBULATORY_CARE_PROVIDER_SITE_OTHER): Payer: Medicare HMO | Admitting: *Deleted

## 2013-01-06 ENCOUNTER — Other Ambulatory Visit: Payer: Self-pay | Admitting: Internal Medicine

## 2013-01-06 DIAGNOSIS — I509 Heart failure, unspecified: Secondary | ICD-10-CM

## 2013-01-06 DIAGNOSIS — I2589 Other forms of chronic ischemic heart disease: Secondary | ICD-10-CM

## 2013-01-06 LAB — ICD DEVICE OBSERVATION
AL IMPEDENCE ICD: 456 Ohm
ATRIAL PACING ICD: 10.2 pct
LV LEAD IMPEDENCE ICD: 722 Ohm
LV LEAD THRESHOLD: 1.5 V
RV LEAD IMPEDENCE ICD: 475 Ohm
RV LEAD THRESHOLD: 0.75 V
TZON-0003FASTVT: 250 ms

## 2013-01-06 NOTE — Progress Notes (Signed)
Wound check-ICD 

## 2013-01-23 ENCOUNTER — Encounter: Payer: Self-pay | Admitting: Internal Medicine

## 2013-02-19 ENCOUNTER — Encounter: Payer: Self-pay | Admitting: Internal Medicine

## 2013-02-19 ENCOUNTER — Ambulatory Visit (INDEPENDENT_AMBULATORY_CARE_PROVIDER_SITE_OTHER): Payer: Medicare HMO | Admitting: Internal Medicine

## 2013-02-19 VITALS — BP 143/71 | HR 60 | Temp 97.1°F | Ht 70.5 in | Wt 176.7 lb

## 2013-02-19 DIAGNOSIS — I509 Heart failure, unspecified: Secondary | ICD-10-CM

## 2013-02-19 DIAGNOSIS — R972 Elevated prostate specific antigen [PSA]: Secondary | ICD-10-CM

## 2013-02-19 DIAGNOSIS — I2510 Atherosclerotic heart disease of native coronary artery without angina pectoris: Secondary | ICD-10-CM

## 2013-02-19 DIAGNOSIS — E119 Type 2 diabetes mellitus without complications: Secondary | ICD-10-CM

## 2013-02-19 DIAGNOSIS — E785 Hyperlipidemia, unspecified: Secondary | ICD-10-CM

## 2013-02-19 LAB — COMPLETE METABOLIC PANEL WITH GFR
BUN: 19 mg/dL (ref 6–23)
CO2: 27 mEq/L (ref 19–32)
Creat: 1.38 mg/dL — ABNORMAL HIGH (ref 0.50–1.35)
GFR, Est African American: 55 mL/min — ABNORMAL LOW
GFR, Est Non African American: 48 mL/min — ABNORMAL LOW
Glucose, Bld: 337 mg/dL — ABNORMAL HIGH (ref 70–99)
Total Bilirubin: 0.5 mg/dL (ref 0.3–1.2)

## 2013-02-19 LAB — LIPID PANEL
HDL: 51 mg/dL (ref >39)
Triglycerides: 125 mg/dL (ref <150)

## 2013-02-19 MED ORDER — INSULIN GLARGINE 100 UNIT/ML SOLOSTAR PEN: 36.0000 [IU] | PEN_INJECTOR | Freq: Every day | SUBCUTANEOUS | Status: DC

## 2013-02-19 NOTE — Progress Notes (Signed)
  Subjective:    Patient ID: [name], male    DOB: [birth year], 79 y.o.   MRN: [id number]  HPI Patient returns for follow-up of his diabetes mellitus, hyperlipidemia, congestive heart failure, and other chronic medical problems.  He has no acute complaints today, and reports that he has been doing well.  He reports that he has been compliant with his medications, and he is following up regularly with his cardiologist.  He denies dyspnea, orthopnea, PND, or significant lower extremity edema; he does report occasional mild ankle swelling which comes and goes.  He did not bring his blood glucose meter to clinic today, and reports that the meter is broken so he has not been measuring his blood sugars recently at home.   Review of Systems  Constitutional: Negative for fever, chills and diaphoresis.  Cardiovascular: Positive for leg swelling (Mild occasional swelling). Negative for chest pain and palpitations.  Gastrointestinal: Negative for nausea, vomiting, abdominal pain, blood in stool and anal bleeding.  Genitourinary: Negative for dysuria and difficulty urinating.  Neurological: Negative for syncope, weakness, light-headedness and numbness.       Objective:   Physical Exam  Constitutional: No distress.  Cardiovascular: Normal rate, regular rhythm and normal heart sounds.  Exam reveals no gallop, no S3, no S4 and no friction rub.   No murmur heard. Trace ankle edema the  Pulmonary/Chest: Effort normal and breath sounds normal. No respiratory distress. He has no wheezes. He has no rales.  Abdominal: Soft. Bowel sounds are normal. He exhibits no distension. There is no hepatosplenomegaly. There is no tenderness. There is no rebound and no guarding.        Assessment & Plan:

## 2013-02-19 NOTE — Assessment & Plan Note (Signed)
Lipids:    Component Value Date/Time   CHOL 149 02/14/2012 0929   TRIG 77 02/14/2012 0929   HDL 51 02/14/2012 0929   LDLCALC 83 02/14/2012 0929   VLDL 15 02/14/2012 0929   CHOLHDL 2.9 02/14/2012 0929    Assessment: Patient is doing well with no apparent side effects on simvastatin 40 mg daily.  Plan: Check a lipid panel today; continue simvastatin 40 mg daily pending that result.

## 2013-02-19 NOTE — Patient Instructions (Signed)
General Instructions: Increase Lantus insulin to a dose of 36 units each evening. Please make an appointment with our diabetes educator [name].   Treatment Goals:  Goals (1 Years of Data) as of 02/19/13         As of Today 04/03/12 02/14/12 01/16/12     Result Component    . HEMOGLOBIN A1C < 7.0  9.0 8.5  8.6    . LDL CALC < 100    83       Progress Toward Treatment Goals:  Treatment Goal 02/19/2013  Hemoglobin A1C deteriorated    Self Care Goals & Plans:  Self Care Goal 02/19/2013  Manage my medications take my medicines as prescribed; bring my medications to every visit  Monitor my health keep track of my blood pressure  Eat healthy foods eat foods that are low in salt; eat more vegetables; drink diet soda or water instead of juice or soda  Be physically active find workout friends    Home Blood Glucose Monitoring 02/19/2013  Check my blood sugar 2 times a day  When to check my blood sugar before breakfast; before dinner     Care Management & Community Referrals:  Referral 02/19/2013  Referrals made for care management support diabetes educator  Referrals made to community resources none

## 2013-02-19 NOTE — Assessment & Plan Note (Signed)
Assessment: Patient has no anginal symptoms.  He is doing well on aspirin 81 mg twice a day and carvedilol 25 mg twice a day.  Of note, patient was previously on Aggrenox but his insurance formulary in March notified patient that they would no longer cover that medication.  I talked with Dr. Lovena Le at that time regarding the question of whether to add Plavix to aspirin or not, given patient's history of coronary and carotid stenting in the past.  Dr. Lovena Le felt that continuing aspirin alone is appropriate rather than adding Plavix to the aspirin.  Plan: Continue current regimen; patient will follow up with Dr. Lovena Le as scheduled.

## 2013-02-19 NOTE — Assessment & Plan Note (Signed)
Assessment: Patient has a history of elevated PSA, followed by his urologist Dr. Karsten Ro.  Plan: Check a PSA today; I advised him to follow-up with Dr. Karsten Ro.

## 2013-02-19 NOTE — Assessment & Plan Note (Signed)
Assessment: Patient is doing well without symptoms of congestive heart failure.  He is currently not on a diuretic or ACE inhibitor.  Plan: Continue current management.  Patient will follow up with his cardiologist Dr. Lovena Le as scheduled.  I advised him to let us know if he should develop any symptoms.

## 2013-02-19 NOTE — Assessment & Plan Note (Signed)
Lab Results  Component Value Date   HGBA1C 9.0 02/19/2013   HGBA1C 8.5 04/03/2012   HGBA1C 8.6 01/16/2012     Assessment: Diabetes control: poor control (HgbA1C >9%) Progress toward A1C goal:  deteriorated Comments: Patient's diabetes is poorly controlled on Lantus insulin 32 units daily at bedtime.  He has not been checking his blood sugars at home.  Plan: Medications:  increase Lantus insulin to a dose of 36 units daily; refer to diabetes educator for diabetes education to Home glucose monitoring: Frequency: 2 times a day Timing: before breakfast;before dinner Instruction/counseling given: reminded to bring blood glucose meter & log to each visit Educational resources provided: brochure Self management tools provided: home glucose logbook Other plans: Check a comprehensive metabolic panel and a urine microalbumin to creatinine ratio.

## 2013-02-20 LAB — PSA: PSA: 6.82 ng/mL — ABNORMAL HIGH (ref <=4.00)

## 2013-03-21 ENCOUNTER — Other Ambulatory Visit: Payer: Self-pay | Admitting: Internal Medicine

## 2013-04-01 ENCOUNTER — Encounter: Payer: Self-pay | Admitting: Internal Medicine

## 2013-04-01 ENCOUNTER — Ambulatory Visit (INDEPENDENT_AMBULATORY_CARE_PROVIDER_SITE_OTHER): Payer: Medicare HMO | Admitting: Internal Medicine

## 2013-04-01 VITALS — BP 124/68 | HR 50 | Ht 71.0 in | Wt 179.0 lb

## 2013-04-01 DIAGNOSIS — I2589 Other forms of chronic ischemic heart disease: Secondary | ICD-10-CM

## 2013-04-01 DIAGNOSIS — I509 Heart failure, unspecified: Secondary | ICD-10-CM

## 2013-04-01 DIAGNOSIS — Z95810 Presence of automatic (implantable) cardiac defibrillator: Secondary | ICD-10-CM

## 2013-04-01 LAB — ICD DEVICE OBSERVATION
AL IMPEDENCE ICD: 456 Ohm
ATRIAL PACING ICD: 5.5 pct
LV LEAD IMPEDENCE ICD: 760 Ohm
RV LEAD IMPEDENCE ICD: 513 Ohm
TZON-0003FASTVT: 250 ms
VENTRICULAR PACING ICD: 100 pct

## 2013-04-01 NOTE — Progress Notes (Signed)
HPI [name] returns today for followup. He is a pleasant 79 yo man with a h/o an ischemic cardiomyopathy, chronic systolic heart failure, ejection fraction 15%, complete heart block, status post biventricular ICD. In the interim, the patient has been stable. He underwent ICD generator removal and upgrade to a biventricular device several months ago. He has done well sent. He denies chest pain, syncope, or ICD shock. He has class II congestive heart failure. He has mild peripheral edema. He admits to dietary indiscretion. No Known Allergies   Current Outpatient Prescriptions  Medication Sig Dispense Refill  . aspirin 81 MG tablet Take 81 mg by mouth 2 (two) times daily.       . Blood Glucose Monitoring Suppl (ACCU-CHEK AVIVA PLUS) W/DEVICE KIT 1 kit by Does not apply route as directed.  1 kit  0  . carvedilol (COREG) 25 MG tablet Take 1 tablet (25 mg total) by mouth 2 (two) times daily with a meal.  60 tablet  6  . Insulin Glargine (LANTUS SOLOSTAR) 100 UNIT/ML SOPN Inject 36 Units into the skin at bedtime.  15 mL  6  . Insulin Pen Needle 31G X 6 MM MISC Use as directed.  100 each  3  . pantoprazole (PROTONIX) 40 MG tablet TAKE ONE TABLET BY MOUTH ONE TIME DAILY  30 tablet  6  . simvastatin (ZOCOR) 40 MG tablet TAKE ONE TABLET BY MOUTH ONE TIME DAILY  30 tablet  6   No current facility-administered medications for this visit.     Past Medical History  Diagnosis Date  . Anemia   . Cataracts, bilateral   . CHF (congestive heart failure)     ischemic CM EF 15-20% s/p AICD 05/24/04  . CAD (coronary artery disease)     s/p AMI, s/p PTCA & stent of cx 12/04 restent 5/05  . Diverticulosis of colon   . Hyperlipidemia   . PVD (peripheral vascular disease)     s/p L carotid PTCA/stent 2004  . Transient ischemic attack   . Erectile dysfunction   . Hyperkalemia 08/2008    K=5.7   . BBB (bundle branch block)     right  . HTN (hypertension)   . Elevated PSA   . Adenomatous colon polyp  02/14/2012  . ICD (implantable cardiac defibrillator) in place 12-25-2012    MDT CRTD upgrade by Dr Lovena Le  . Pacemaker   . Type II diabetes mellitus   . Myocardial infarction 1990  . Arthritis     "right hip" (12/25/2012)    ROS:   All systems reviewed and negative except as noted in the HPI.   Past Surgical History  Procedure Laterality Date  . Cardiac defibrillator placement  05/24/2004    Implantation of a MDT single-chamber defibrillator  . Carotid stent  09/11/2003    Percutaneous transluminal angioplasty and stent placement of the left internal carotid artery.  . Biv icd upgrade  12/25/2012    MDT CRTD upgrade by Dr Lovena Le for ischemic cardiomyopathy and worsening conduction system disease  . Cataract extraction w/ intraocular lens  implant, bilateral Bilateral ~ 2010  . Cardiac catheterization  06/2003,  01/2004  . Coronary angioplasty with stent placement  1990    "2" (12/25/2012)     Family History  Problem Relation Age of Onset  . Diabetes Mother   . Diabetes Brother      History   Social History  . Marital Status: Widowed    Spouse Name: N/A  Number of Children: N/A  . Years of Education: N/A   Occupational History  . Not on file.   Social History Main Topics  . Smoking status: Former Smoker    Types: Cigarettes    Quit date: 12/27/1967  . Smokeless tobacco: Never Used  . Alcohol Use: No     Comment: 12/25/2012 "quit all alcohol 60 yr ago"  . Drug Use: No  . Sexually Active: Yes   Other Topics Concern  . Not on file   Social History Narrative   Single, 2 adult children, daughter in [place], son in [place]     BP 124/68  Pulse 50  Ht 5\' 11"  (1.803 m)  Wt 179 lb (81.194 kg)  BMI 24.98 kg/m2  Physical Exam:  Well appearing 79 year old man, NAD HEENT: Unremarkable Neck:  No JVD, no thyromegally Lungs:  Clear with no wheezes, rales, or rhonchi. HEART:  Regular rate rhythm, no murmurs, no rubs, no clicks Abd:  soft, positive bowel sounds,  no organomegally, no rebound, no guarding Ext:  2 plus pulses, no edema, no cyanosis, no clubbing Skin:  No rashes no nodules Neuro:  CN II through XII intact, motor grossly intact  EKG - normal sinus rhythm with AV sequential biventricular pacing  DEVICE  Normal device function.  See PaceArt for details.   Assess/Plan:

## 2013-04-01 NOTE — Assessment & Plan Note (Signed)
His Medtronic biventricular ICD is working normally. We'll plan to recheck in several months.

## 2013-04-01 NOTE — Patient Instructions (Addendum)
**Note De-identified  Obfuscation** Your physician recommends that you continue on your current medications as directed. Please refer to the Current Medication list given to you today.  Your physician wants you to follow-up in: 9 months You will receive a reminder letter in the mail two months in advance. If you don't receive a letter, please call our office to schedule the follow-up appointment.  

## 2013-04-01 NOTE — Assessment & Plan Note (Signed)
The patient has no anginal symptoms. He is instructed to continue his current medical therapy.

## 2013-04-03 ENCOUNTER — Other Ambulatory Visit: Payer: Self-pay

## 2013-04-07 ENCOUNTER — Encounter: Payer: Medicare HMO | Admitting: Dietician

## 2013-04-07 ENCOUNTER — Telehealth: Payer: Self-pay | Admitting: Dietician

## 2013-04-07 ENCOUNTER — Encounter: Payer: Self-pay | Admitting: Internal Medicine

## 2013-04-07 NOTE — Telephone Encounter (Deleted)
.  imcdm

## 2013-04-09 NOTE — Telephone Encounter (Signed)
Rescheduled missed appointment for Tuesday 04/15/13 at 2 PM.

## 2013-04-10 ENCOUNTER — Other Ambulatory Visit: Payer: Self-pay | Admitting: Internal Medicine

## 2013-04-10 DIAGNOSIS — E119 Type 2 diabetes mellitus without complications: Secondary | ICD-10-CM

## 2013-04-15 ENCOUNTER — Other Ambulatory Visit: Payer: Medicare HMO

## 2013-04-15 ENCOUNTER — Other Ambulatory Visit: Payer: Self-pay | Admitting: Dietician

## 2013-04-15 ENCOUNTER — Encounter: Payer: Self-pay | Admitting: Dietician

## 2013-04-15 ENCOUNTER — Ambulatory Visit (INDEPENDENT_AMBULATORY_CARE_PROVIDER_SITE_OTHER): Payer: Medicare HMO | Admitting: Dietician

## 2013-04-15 DIAGNOSIS — E119 Type 2 diabetes mellitus without complications: Secondary | ICD-10-CM

## 2013-04-15 MED ORDER — GLUCOSE BLOOD VI STRP: ORAL_STRIP | Status: DC

## 2013-04-15 MED ORDER — FREESTYLE FREEDOM LITE W/DEVICE KIT: PACK | Status: DC

## 2013-04-15 NOTE — Patient Instructions (Addendum)
I will call you in 1 week to talk about how you are doing with your new meter and if you got more strips.   Please call Butch Penny with any questions or concerns.  Butch Penny [phone]

## 2013-04-15 NOTE — Progress Notes (Signed)
Medical Nutrition Therapy:  Appt start time: [id number] end time:  [id number].  Assessment:  Primary concerns today: Blood sugar control and Annual Review.  Weight stable, reports he takes 36 units lantus every night. Main purpose for patient visit today is to obtain working meter. Usual eating pattern includes 3 meals and 0-3 snacks per day. Usual physical activity includes ADLs.      Progress Towards Goal(s):  In progress.   Nutritional Diagnosis:  NB-1.4 Self-monitoring deficit As related to  not having a working meter.  As evidenced by non- working meter and outdated strips brought in by patient. .    Intervention: Problem solving and self monitoring used to assist patient in obtaining new meter and supplies and learning how to use new meter. Used teach back: patient demonstrated how to use new meter by checking his blood sugar at visit- result was 151 reportedly after lunch. Patient needed assistance knowing how to use his lancing device.   Monitoring/Evaluation: Blood sugars and verbalized understanding of how to use meter in 1 week by phone.

## 2013-04-15 NOTE — Telephone Encounter (Signed)
Patient was given a Freestyle Freedom Lite meter today per his request. He needs strip to check his blood sugar- he reports he checks 1 time a day in the morning.

## 2013-04-16 LAB — MICROALBUMIN / CREATININE URINE RATIO
Creatinine, Urine: 90.8 mg/dL
Microalb, Ur: 0.95 mg/dL (ref 0.00–1.89)

## 2013-05-15 ENCOUNTER — Other Ambulatory Visit: Payer: Self-pay | Admitting: Internal Medicine

## 2013-05-28 ENCOUNTER — Encounter: Payer: Self-pay | Admitting: Internal Medicine

## 2013-05-28 ENCOUNTER — Ambulatory Visit: Payer: Medicare HMO | Admitting: Internal Medicine

## 2013-07-07 ENCOUNTER — Emergency Department (INDEPENDENT_AMBULATORY_CARE_PROVIDER_SITE_OTHER)
Admission: EM | Admit: 2013-07-07 | Discharge: 2013-07-07 | Disposition: A | Discharge status: Home / Self Care | Payer: Commercial Managed Care - HMO | Source: Home / Self Care | Attending: Family Medicine | Admitting: Family Medicine

## 2013-07-07 ENCOUNTER — Encounter (HOSPITAL_COMMUNITY): Payer: Self-pay | Admitting: Emergency Medicine

## 2013-07-07 ENCOUNTER — Ambulatory Visit (INDEPENDENT_AMBULATORY_CARE_PROVIDER_SITE_OTHER): Payer: Medicare HMO | Admitting: *Deleted

## 2013-07-07 DIAGNOSIS — H6122 Impacted cerumen, left ear: Secondary | ICD-10-CM

## 2013-07-07 DIAGNOSIS — I509 Heart failure, unspecified: Secondary | ICD-10-CM

## 2013-07-07 DIAGNOSIS — H6120 Impacted cerumen, unspecified ear: Secondary | ICD-10-CM

## 2013-07-07 DIAGNOSIS — I2589 Other forms of chronic ischemic heart disease: Secondary | ICD-10-CM

## 2013-07-07 LAB — ICD DEVICE OBSERVATION
AL AMPLITUDE: 2 mv
ATRIAL PACING ICD: 15.3 pct
DEV-0020ICD: NEGATIVE
HV IMPEDENCE: 48 Ohm
RV LEAD AMPLITUDE: 19.9 mv
RV LEAD IMPEDENCE ICD: 418 Ohm
RV LEAD THRESHOLD: 1 V
TZON-0003SLOWVT: 359.2 ms

## 2013-07-07 NOTE — ED Provider Notes (Signed)
CSN: [id number]     Arrival date & time 07/07/13  1414 History   None    Chief Complaint  Patient presents with  . Tinnitus    hearing a weird noise in right ear since yesterday.    (Consider location/radiation/quality/duration/timing/severity/associated sxs/prior Treatment) Patient is a 79 y.o. male presenting with ear pain. The history is provided by the patient.  Otalgia Location:  Right Behind ear:  No abnormality Quality:  Pressure Severity:  Mild Duration:  1 day Progression:  Worsening Chronicity:  New Associated symptoms: congestion   Associated symptoms: no ear discharge, no fever, no hearing loss, no rhinorrhea and no sore throat     Past Medical History  Diagnosis Date  . Anemia   . Cataracts, bilateral   . CHF (congestive heart failure)     ischemic CM EF 15-20% s/p AICD 05/24/04  . CAD (coronary artery disease)     s/p AMI, s/p PTCA & stent of cx 12/04 restent 5/05  . Diverticulosis of colon   . Hyperlipidemia   . PVD (peripheral vascular disease)     s/p L carotid PTCA/stent 2004  . Transient ischemic attack   . Erectile dysfunction   . Hyperkalemia 08/2008    K=5.7   . BBB (bundle branch block)     right  . HTN (hypertension)   . Elevated PSA   . Adenomatous colon polyp 02/14/2012  . ICD (implantable cardiac defibrillator) in place 12-25-2012    MDT CRTD upgrade by Dr Lovena Le  . Pacemaker   . Type II diabetes mellitus   . Myocardial infarction 1990  . Arthritis     "right hip" (12/25/2012)   Past Surgical History  Procedure Laterality Date  . Cardiac defibrillator placement  05/24/2004    Implantation of a MDT single-chamber defibrillator  . Carotid stent  09/11/2003    Percutaneous transluminal angioplasty and stent placement of the left internal carotid artery.  . Biv icd upgrade  12/25/2012    MDT CRTD upgrade by Dr Lovena Le for ischemic cardiomyopathy and worsening conduction system disease  . Cataract extraction w/ intraocular lens  implant,  bilateral Bilateral ~ 2010  . Cardiac catheterization  06/2003,  01/2004  . Coronary angioplasty with stent placement  1990    "2" (12/25/2012)   Family History  Problem Relation Age of Onset  . Diabetes Mother   . Diabetes Brother    History  Substance Use Topics  . Smoking status: Former Smoker    Types: Cigarettes    Quit date: 12/27/1967  . Smokeless tobacco: Never Used  . Alcohol Use: No     Comment: 12/25/2012 "quit all alcohol 60 yr ago"    Review of Systems  Constitutional: Negative for fever.  HENT: Positive for congestion and ear pain. Negative for ear discharge, hearing loss, rhinorrhea and sore throat.     Allergies  Review of patient's allergies indicates no known allergies.  Home Medications   Current Outpatient Rx  Name  Route  Sig  Dispense  Refill  . aspirin 81 MG tablet   Oral   Take 81 mg by mouth 2 (two) times daily.          . carvedilol (COREG) 25 MG tablet   Oral   Take 1 tablet (25 mg total) by mouth 2 (two) times daily with a meal.   60 tablet   6   . Insulin Glargine (LANTUS SOLOSTAR) 100 UNIT/ML SOPN   Subcutaneous   Inject 36 Units into  the skin at bedtime.   15 mL   6   . pantoprazole (PROTONIX) 40 MG tablet      TAKE ONE TABLET BY MOUTH ONE TIME DAILY   30 tablet   6   . simvastatin (ZOCOR) 40 MG tablet      TAKE ONE TABLET BY MOUTH ONE TIME DAILY   30 tablet   6   . B-D ULTRAFINE III SHORT PEN 31G X 8 MM MISC      USE AS DIRECTED   100 each   2   . Blood Glucose Monitoring Suppl (FREESTYLE FREEDOM LITE) W/DEVICE KIT      Use to check blood sugar as directed.   1 each   0   . glucose blood test strip      Use to check blood sugar 2 times a day as directed. Dx: ICD-9 250.00, insulin requiring.   100 each   12    BP 146/87  Pulse 61  Temp(Src) 98 F (36.7 C) (Oral)  Resp 16  SpO2 99% Physical Exam  Nursing note and vitals reviewed. Constitutional: He appears well-developed and well-nourished.  HENT:    Head: Normocephalic.  Right Ear: Tympanic membrane and external ear normal. Decreased hearing is noted.  Left Ear: Decreased hearing is noted.  Ears:  Nose: Nose normal.  Mouth/Throat: Oropharynx is clear and moist.    ED Course  Procedures (including critical care time) Labs Review Labs Reviewed - No data to display Imaging Review No results found.    MDM  Sx resolved after ear irrig., canals and tms bilat nl.    Billy Fischer, MD 07/07/13 [phone]

## 2013-07-07 NOTE — ED Notes (Signed)
C/o hearing weird noise in right ear since yesterday. Denies pain or any other symptoms.

## 2013-07-07 NOTE — Progress Notes (Signed)
CRT-D device check in office. Thresholds and sensing consistent with previous device measurements. Lead impedance trends stable over time. No mode switch episodes recorded. No ventricular arrhythmia episodes recorded. Patient bi-ventricularly pacing >100% of the time. Device programmed with appropriate safety margins. Heart failure diagnostics reviewed.  Optiovl and thoracic impedance abnormal 9/9 ongoing.   Audible/vibratory alerts demonstrated for patient. No changes made this session. Estimated longevity 8.2 years.  Patient enrolled in remote follow up. Plan to check device remotely in 3 months and see in office in 6 months. Patient education completed including shock plan.  Carelink 10/10/13.

## 2013-07-10 ENCOUNTER — Encounter: Payer: Self-pay | Admitting: Internal Medicine

## 2013-07-15 ENCOUNTER — Encounter: Payer: Self-pay | Admitting: Internal Medicine

## 2013-09-03 ENCOUNTER — Encounter: Payer: Self-pay | Admitting: Internal Medicine

## 2013-09-03 ENCOUNTER — Ambulatory Visit (INDEPENDENT_AMBULATORY_CARE_PROVIDER_SITE_OTHER): Payer: Medicare HMO | Admitting: Internal Medicine

## 2013-09-03 ENCOUNTER — Encounter (INDEPENDENT_AMBULATORY_CARE_PROVIDER_SITE_OTHER): Payer: Self-pay

## 2013-09-03 VITALS — BP 129/73 | HR 65 | Temp 97.5°F | Ht 70.5 in | Wt 192.2 lb

## 2013-09-03 DIAGNOSIS — E785 Hyperlipidemia, unspecified: Secondary | ICD-10-CM

## 2013-09-03 DIAGNOSIS — H9319 Tinnitus, unspecified ear: Secondary | ICD-10-CM

## 2013-09-03 DIAGNOSIS — I2589 Other forms of chronic ischemic heart disease: Secondary | ICD-10-CM

## 2013-09-03 DIAGNOSIS — E119 Type 2 diabetes mellitus without complications: Secondary | ICD-10-CM

## 2013-09-03 DIAGNOSIS — H9311 Tinnitus, right ear: Secondary | ICD-10-CM | POA: Insufficient documentation

## 2013-09-03 DIAGNOSIS — D696 Thrombocytopenia, unspecified: Secondary | ICD-10-CM

## 2013-09-03 DIAGNOSIS — R972 Elevated prostate specific antigen [PSA]: Secondary | ICD-10-CM

## 2013-09-03 LAB — COMPLETE METABOLIC PANEL WITH GFR
ALT: 20 U/L (ref 0–53)
Alkaline Phosphatase: 71 U/L (ref 39–117)
Potassium: 4.9 mEq/L (ref 3.5–5.3)
Sodium: 139 mEq/L (ref 135–145)
Total Bilirubin: 0.4 mg/dL (ref 0.3–1.2)
Total Protein: 6.9 g/dL (ref 6.0–8.3)

## 2013-09-03 LAB — CBC WITH DIFFERENTIAL/PLATELET
Basophils Relative: 1 % (ref 0–1)
Eosinophils Absolute: 0.2 10*3/uL (ref 0.0–0.7)
Eosinophils Relative: 3 % (ref 0–5)
HCT: 44.9 % (ref 39.0–52.0)
Hemoglobin: 15 g/dL (ref 13.0–17.0)
MCH: 30.7 pg (ref 26.0–34.0)
MCHC: 33.4 g/dL (ref 30.0–36.0)
MCV: 92 fL (ref 78.0–100.0)
Monocytes Absolute: 1 10*3/uL (ref 0.1–1.0)
Monocytes Relative: 12 % (ref 3–12)

## 2013-09-03 LAB — GLUCOSE, CAPILLARY: Glucose-Capillary: 185 mg/dL — ABNORMAL HIGH (ref 70–99)

## 2013-09-03 MED ORDER — CARVEDILOL 25 MG PO TABS: 25.0000 mg | ORAL_TABLET | Freq: Two times a day (BID) | ORAL | Status: DC

## 2013-09-03 MED ORDER — INSULIN GLARGINE 100 UNIT/ML SOLOSTAR PEN: 40.0000 [IU] | PEN_INJECTOR | Freq: Every day | SUBCUTANEOUS | Status: DC

## 2013-09-03 MED ORDER — SIMVASTATIN 40 MG PO TABS: 40.0000 mg | ORAL_TABLET | Freq: Every day | ORAL | Status: DC

## 2013-09-03 MED ORDER — PANTOPRAZOLE SODIUM 40 MG PO TBEC: 40.0000 mg | DELAYED_RELEASE_TABLET | Freq: Every day | ORAL | Status: DC

## 2013-09-03 NOTE — Assessment & Plan Note (Signed)
PSA  Date Value Range Status  02/19/2013 6.82* <=4.00 ng/mL Final  02/14/2012 7.30* <=4.00 ng/mL Final  03/10/2009 5.53* 0.10-4.00 ng/mL Final     Assessment: Patient has a history of mildly elevated PSA, and has been followed by his urologist Dr. Karsten Ro.  Plan: I advised patient to follow-up with Dr. Karsten Ro.

## 2013-09-03 NOTE — Assessment & Plan Note (Signed)
Lab Results  Component Value Date   HGBA1C 8.0 09/03/2013   HGBA1C 9.0 02/19/2013   HGBA1C 8.5 04/03/2012     Assessment: Diabetes control: fair control Progress toward A1C goal:  improved Comments: Patient's hemoglobin A1c has improved from 9-8 on a regimen of Lantus insulin 36 units daily at bedtime.  [name] reports that [name] has not been checking his blood sugars because [name] cannot afford the test strips.  Plan: Medications:  Increase Lantus insulin to a dose of 40 units daily at bedtime Home glucose monitoring: Frequency: 2 times a day Timing: before breakfast;before dinner Instruction/counseling given: reminded to get eye exam

## 2013-09-03 NOTE — Patient Instructions (Signed)
General Instructions: Increase Lantus insulin to a dose of 40 units daily. Please schedule a routine followup appointment with your urologist. A referral has been made to an ENT doctor to evaluate the noise in your right ear.   Progress Toward Treatment Goals:  Treatment Goal 09/03/2013  Hemoglobin A1C improved    Self Care Goals & Plans:  Self Care Goal 09/03/2013  Manage my medications take my medicines as prescribed; bring my medications to every visit  Monitor my health keep track of my blood glucose  Eat healthy foods drink diet soda or water instead of juice or soda; eat foods that are low in salt; eat baked foods instead of fried foods; eat more vegetables; eat smaller portions  Be physically active -    Home Blood Glucose Monitoring 09/03/2013  Check my blood sugar 2 times a day  When to check my blood sugar before breakfast; before dinner     Care Management & Community Referrals:  Referral 09/03/2013  Referrals made for care management support none needed  Referrals made to community resources none

## 2013-09-03 NOTE — Assessment & Plan Note (Signed)
Platelets  Date Value Range Status  12/27/2012 93* 150 - 400 K/uL Final  12/19/2012 148.0* 150.0 - 400.0 K/uL Final  02/14/2012 198  150 - 400 K/uL Final  04/12/2011 160  150 - 400 K/uL Final     Assessment: Patient had mild thrombocytopenia noted on 2 CBCs done earlier this year.  Plan: Check CBC with differential today.

## 2013-09-03 NOTE — Progress Notes (Signed)
   Subjective:    Patient ID: [name], male    DOB: Oct 27, 1930, 79 y.o.   MRN: [id number]  HPI Patient returns for followup of his diabetes mellitus, ischemic cardiomyopathy, and other chronic medical problems.  Today his main complaint is a recurrent clicking noise in his right ear that began about 3 months ago; he says this started after he had cerumen removed from his ears at urgent care.  He denies any loss of hearing or ear pain; he also denies discharge from the ear canal.  Otherwise, he reports that he is doing well.  He says that he has been taking his medications as prescribed.  He denies any respiratory problems including dyspnea, orthopnea, or PND; he also denies chest pain or lower extremity edema.   Review of Systems  Constitutional: Negative for fever, chills and diaphoresis.  Eyes: Negative for visual disturbance.  Respiratory: Negative for cough, shortness of breath and wheezing.   Cardiovascular: Negative for chest pain and leg swelling.  Gastrointestinal: Negative for nausea, vomiting, abdominal pain, blood in stool and anal bleeding.  Endocrine: Negative for polydipsia, polyphagia and polyuria.  Genitourinary: Negative for dysuria and difficulty urinating.  Neurological: Negative for weakness and numbness.       Objective:   Physical Exam  Constitutional: He appears well-nourished. No distress.  HENT:  Head: Normocephalic and atraumatic.  Right Ear: Tympanic membrane and external ear normal. No drainage, swelling or tenderness. No foreign bodies.  Left Ear: Tympanic membrane and external ear normal. No drainage, swelling or tenderness. No foreign bodies.  Cardiovascular: Normal rate, regular rhythm, S1 normal and S2 normal.  Exam reveals no gallop, no S3 and no S4.   Murmur heard.  Systolic murmur is present with a grade of 2/6  Trace ankle edema.  Pulmonary/Chest: Effort normal and breath sounds normal. No respiratory distress. He has no wheezes. He has no  rales.  Abdominal: Soft. Bowel sounds are normal. He exhibits no distension. There is no tenderness. There is no rebound and no guarding.       Assessment & Plan:

## 2013-09-03 NOTE — Assessment & Plan Note (Signed)
Assessment: Patient has no anginal symptoms and no symptoms of congestive heart failure.  He has trace ankle edema on exam but otherwise no significant findings.  Plan: Continue current medications (aspirin 81 mg twice a day and carvedilol 25 mg twice a day).  I advised him to call or return if he has any significant leg edema or any symptoms of chest pain, shortness of breath, orthopnea, or PND.

## 2013-09-03 NOTE — Assessment & Plan Note (Signed)
Assessment: Patient reports an intermittent clicking noise in his right ear for about 3 months; he says this started after he had cerumen removed from his ear canals at urgent care.  His exam is unremarkable.  Plan: Refer to ENT for evaluation of tinnitus.

## 2013-09-03 NOTE — Assessment & Plan Note (Signed)
Lipids:    Component Value Date/Time   CHOL 146 02/19/2013 1055   TRIG 125 02/19/2013 1055   HDL 51 02/19/2013 1055   LDLCALC 70 02/19/2013 1055   VLDL 25 02/19/2013 1055   CHOLHDL 2.9 02/19/2013 1055    Assessment: Patient is doing well on simvastatin 40 mg daily; LDL is at goal.  Plan: Continue simvastatin 40 mg daily.

## 2013-09-22 ENCOUNTER — Telehealth: Payer: Self-pay | Admitting: *Deleted

## 2013-09-22 NOTE — Telephone Encounter (Signed)
I left a message for the patient to call and confirm if he received his remote transmitter. Ordered 09/01/13, shipped 09/04/13, received per tracking on 09/09/13.

## 2013-09-29 NOTE — Telephone Encounter (Signed)
I left a message for the patient to call. 

## 2013-10-10 ENCOUNTER — Encounter: Payer: Medicare HMO | Admitting: *Deleted

## 2013-10-14 NOTE — Telephone Encounter (Signed)
Staff message forwarded to the device clinic that I was trying to follow this patient for ICM clinic, but I cannot reach him to confirm he has his transmitter even though [facility] confirms this was shipped and delivered. I advised I do not know how well he will do with remotes, but I will not follow him at this time.

## 2013-11-23 ENCOUNTER — Emergency Department (HOSPITAL_COMMUNITY)
Admission: EM | Admit: 2013-11-23 | Discharge: 2013-11-23 | Disposition: A | Discharge status: Home / Self Care | Payer: Medicare HMO | Attending: Emergency Medicine | Admitting: Emergency Medicine

## 2013-11-23 ENCOUNTER — Emergency Department (HOSPITAL_COMMUNITY): Payer: Medicare HMO

## 2013-11-23 ENCOUNTER — Encounter (HOSPITAL_COMMUNITY): Payer: Self-pay | Admitting: Emergency Medicine

## 2013-11-23 DIAGNOSIS — Z79899 Other long term (current) drug therapy: Secondary | ICD-10-CM | POA: Diagnosis not present

## 2013-11-23 DIAGNOSIS — Z9861 Coronary angioplasty status: Secondary | ICD-10-CM | POA: Insufficient documentation

## 2013-11-23 DIAGNOSIS — Z9889 Other specified postprocedural states: Secondary | ICD-10-CM | POA: Insufficient documentation

## 2013-11-23 DIAGNOSIS — Z860100 Personal history of colon polyps, unspecified: Secondary | ICD-10-CM | POA: Insufficient documentation

## 2013-11-23 DIAGNOSIS — Z8673 Personal history of transient ischemic attack (TIA), and cerebral infarction without residual deficits: Secondary | ICD-10-CM | POA: Insufficient documentation

## 2013-11-23 DIAGNOSIS — R109 Unspecified abdominal pain: Secondary | ICD-10-CM | POA: Insufficient documentation

## 2013-11-23 DIAGNOSIS — Z8669 Personal history of other diseases of the nervous system and sense organs: Secondary | ICD-10-CM | POA: Diagnosis not present

## 2013-11-23 DIAGNOSIS — Z95810 Presence of automatic (implantable) cardiac defibrillator: Secondary | ICD-10-CM | POA: Diagnosis not present

## 2013-11-23 DIAGNOSIS — J069 Acute upper respiratory infection, unspecified: Secondary | ICD-10-CM

## 2013-11-23 DIAGNOSIS — Z794 Long term (current) use of insulin: Secondary | ICD-10-CM | POA: Diagnosis not present

## 2013-11-23 DIAGNOSIS — I509 Heart failure, unspecified: Secondary | ICD-10-CM | POA: Insufficient documentation

## 2013-11-23 DIAGNOSIS — Z8719 Personal history of other diseases of the digestive system: Secondary | ICD-10-CM | POA: Diagnosis not present

## 2013-11-23 DIAGNOSIS — I2510 Atherosclerotic heart disease of native coronary artery without angina pectoris: Secondary | ICD-10-CM | POA: Insufficient documentation

## 2013-11-23 DIAGNOSIS — Z87448 Personal history of other diseases of urinary system: Secondary | ICD-10-CM | POA: Insufficient documentation

## 2013-11-23 DIAGNOSIS — I10 Essential (primary) hypertension: Secondary | ICD-10-CM | POA: Diagnosis not present

## 2013-11-23 DIAGNOSIS — R509 Fever, unspecified: Secondary | ICD-10-CM | POA: Diagnosis present

## 2013-11-23 DIAGNOSIS — Z87891 Personal history of nicotine dependence: Secondary | ICD-10-CM | POA: Diagnosis not present

## 2013-11-23 DIAGNOSIS — I252 Old myocardial infarction: Secondary | ICD-10-CM | POA: Diagnosis not present

## 2013-11-23 DIAGNOSIS — Z7982 Long term (current) use of aspirin: Secondary | ICD-10-CM | POA: Diagnosis not present

## 2013-11-23 DIAGNOSIS — E785 Hyperlipidemia, unspecified: Secondary | ICD-10-CM | POA: Diagnosis not present

## 2013-11-23 DIAGNOSIS — E119 Type 2 diabetes mellitus without complications: Secondary | ICD-10-CM | POA: Diagnosis not present

## 2013-11-23 DIAGNOSIS — M1610 Unilateral primary osteoarthritis, unspecified hip: Secondary | ICD-10-CM | POA: Insufficient documentation

## 2013-11-23 DIAGNOSIS — Z862 Personal history of diseases of the blood and blood-forming organs and certain disorders involving the immune mechanism: Secondary | ICD-10-CM | POA: Diagnosis not present

## 2013-11-23 LAB — I-STAT CHEM 8, ED
BUN: 24 mg/dL — AB (ref 6–23)
CREATININE: 1.5 mg/dL — AB (ref 0.50–1.35)
Calcium, Ion: 1.25 mmol/L (ref 1.13–1.30)
Chloride: 102 mEq/L (ref 96–112)
Glucose, Bld: 105 mg/dL — ABNORMAL HIGH (ref 70–99)
HCT: 49 % (ref 39.0–52.0)
HEMOGLOBIN: 16.7 g/dL (ref 13.0–17.0)
Potassium: 4.2 mEq/L (ref 3.7–5.3)
SODIUM: 140 meq/L (ref 137–147)
TCO2: 24 mmol/L (ref 0–100)

## 2013-11-23 LAB — I-STAT TROPONIN, ED: Troponin i, poc: 0.04 ng/mL (ref 0.00–0.08)

## 2013-11-23 LAB — CBC WITH DIFFERENTIAL/PLATELET
BASOS ABS: 0 10*3/uL (ref 0.0–0.1)
Basophils Relative: 1 % (ref 0–1)
EOS PCT: 3 % (ref 0–5)
Eosinophils Absolute: 0.2 10*3/uL (ref 0.0–0.7)
HCT: 45 % (ref 39.0–52.0)
Hemoglobin: 14.8 g/dL (ref 13.0–17.0)
Lymphocytes Relative: 29 % (ref 12–46)
Lymphs Abs: 2.4 10*3/uL (ref 0.7–4.0)
MCH: 30.4 pg (ref 26.0–34.0)
MCHC: 32.9 g/dL (ref 30.0–36.0)
MCV: 92.4 fL (ref 78.0–100.0)
Monocytes Absolute: 1.4 10*3/uL — ABNORMAL HIGH (ref 0.1–1.0)
Monocytes Relative: 17 % — ABNORMAL HIGH (ref 3–12)
Neutro Abs: 4.2 10*3/uL (ref 1.7–7.7)
Neutrophils Relative %: 51 % (ref 43–77)
PLATELETS: 115 10*3/uL — AB (ref 150–400)
RBC: 4.87 MIL/uL (ref 4.22–5.81)
RDW: 13.8 % (ref 11.5–15.5)
WBC: 8.1 10*3/uL (ref 4.0–10.5)

## 2013-11-23 NOTE — Discharge Instructions (Signed)

## 2013-11-23 NOTE — ED Notes (Signed)
Per EMS: Pt reports fever/chills and generalized body aches and weakness since Thursday. Aso reports productive cough x Thursday. Denies N/V/D. AO x4. 140/76. 60 bpm. 96% RA. CBG 118. 100.2.

## 2013-11-23 NOTE — ED Notes (Signed)
Pt reports that on Thursday night he developed a productive cough and chills. States that the cough has continued. Pt states that today he developed generalized pain, including bilateral legs, chest and HA. Pt in NAD. Denies N/V/D. Denies fever or chills. Pt AO x4. Pt ambulates independently.

## 2013-11-23 NOTE — ED Provider Notes (Signed)
CSN: [id number]     Arrival date & time 11/23/13  1538 History   First MD Initiated Contact with Patient 11/23/13 1542     Chief Complaint  Patient presents with  . Fever     (Consider location/radiation/quality/duration/timing/severity/associated sxs/prior Treatment) Patient is a 80 y.o. male presenting with fever. The history is provided by the patient.  Fever Associated symptoms: chest pain, cough and headaches   Associated symptoms: no diarrhea, no nausea, no rash and no vomiting    patient presents with cough 4 days ago. He states he knows it was Thursday because it was "rasslin" night. He states he began to have some chest pain and a cough and started to shake all over. He states he doesn't know about a fever. He states he began to hurt on the entire front of his body. He states his pain started again today and he he hurts over the entire front of his body again. He states he came to the ER as his friend told him he needs to come in because he is having chest pain. He does have a history of coronary artery disease and has a pacemaker. He states his been coughing without sputum production. No sore throat. No nausea vomiting diarrhea.  Past Medical History  Diagnosis Date  . Anemia   . Cataracts, bilateral   . CHF (congestive heart failure)     ischemic CM EF 15-20% s/p AICD 05/24/04  . CAD (coronary artery disease)     s/p AMI, s/p PTCA & stent of cx 12/04 restent 5/05  . Diverticulosis of colon   . Hyperlipidemia   . PVD (peripheral vascular disease)     s/p L carotid PTCA/stent 2004  . Transient ischemic attack   . Erectile dysfunction   . Hyperkalemia 08/2008    K=5.7   . BBB (bundle branch block)     right  . HTN (hypertension)   . Elevated PSA   . Adenomatous colon polyp 02/14/2012  . ICD (implantable cardiac defibrillator) in place 12-25-2012    MDT CRTD upgrade by Dr Lovena Le  . Pacemaker   . Type II diabetes mellitus   . Myocardial infarction 1990  . Arthritis      "right hip" (12/25/2012)   Past Surgical History  Procedure Laterality Date  . Cardiac defibrillator placement  05/24/2004    Implantation of a MDT single-chamber defibrillator  . Carotid stent  09/11/2003    Percutaneous transluminal angioplasty and stent placement of the left internal carotid artery.  . Biv icd upgrade  12/25/2012    MDT CRTD upgrade by Dr Lovena Le for ischemic cardiomyopathy and worsening conduction system disease  . Cataract extraction w/ intraocular lens  implant, bilateral Bilateral ~ 2010  . Cardiac catheterization  06/2003,  01/2004  . Coronary angioplasty with stent placement  1990    "2" (12/25/2012)   Family History  Problem Relation Age of Onset  . Diabetes Mother   . Diabetes Brother    History  Substance Use Topics  . Smoking status: Former Smoker    Types: Cigarettes    Quit date: 12/27/1967  . Smokeless tobacco: Never Used  . Alcohol Use: No     Comment: 12/25/2012 "quit all alcohol 60 yr ago"    Review of Systems  Constitutional: Positive for fever. Negative for activity change and appetite change.  Eyes: Negative for pain.  Respiratory: Positive for cough. Negative for chest tightness and shortness of breath.   Cardiovascular: Positive for chest pain.  Negative for leg swelling.  Gastrointestinal: Positive for abdominal pain. Negative for nausea, vomiting and diarrhea.  Genitourinary: Positive for flank pain.  Musculoskeletal: Negative for back pain and neck stiffness.  Skin: Negative for rash.  Neurological: Positive for headaches. Negative for weakness and numbness.  Psychiatric/Behavioral: Negative for behavioral problems.      Allergies  Review of patient's allergies indicates no known allergies.  Home Medications   Current Outpatient Rx  Name  Route  Sig  Dispense  Refill  . aspirin 81 MG tablet   Oral   Take 81 mg by mouth 2 (two) times daily.          . B-D ULTRAFINE III SHORT PEN 31G X 8 MM MISC      USE AS DIRECTED   100  each   2   . Blood Glucose Monitoring Suppl (FREESTYLE FREEDOM LITE) W/DEVICE KIT      Use to check blood sugar as directed.   1 each   0   . carvedilol (COREG) 25 MG tablet   Oral   Take 1 tablet (25 mg total) by mouth 2 (two) times daily with a meal.   60 tablet   6   . glucose blood test strip      Use to check blood sugar 2 times a day as directed. Dx: ICD-9 250.00, insulin requiring.   100 each   12   . Insulin Glargine (LANTUS SOLOSTAR) 100 UNIT/ML SOPN   Subcutaneous   Inject 40 Units into the skin at bedtime.   15 mL   6   . pantoprazole (PROTONIX) 40 MG tablet   Oral   Take 1 tablet (40 mg total) by mouth daily.   30 tablet   6   . simvastatin (ZOCOR) 40 MG tablet   Oral   Take 1 tablet (40 mg total) by mouth daily.   30 tablet   6    BP 123/90  Pulse 57  Temp(Src) 97.3 F (36.3 C) (Oral)  Resp 25  SpO2 97% Physical Exam  Nursing note and vitals reviewed. Constitutional: He is oriented to person, place, and time. He appears well-developed and well-nourished.  HENT:  Head: Normocephalic and atraumatic.  Eyes: EOM are normal. Pupils are equal, round, and reactive to light.  Neck: Normal range of motion. Neck supple.  Cardiovascular: Normal rate, regular rhythm and normal heart sounds.   No murmur heard. Pulmonary/Chest: Effort normal and breath sounds normal.  Pacemaker to left upper chest wall.  Abdominal: Soft. Bowel sounds are normal. He exhibits no distension and no mass. There is no tenderness. There is no rebound and no guarding.  Musculoskeletal: Normal range of motion. He exhibits no edema.  Neurological: He is alert and oriented to person, place, and time. No cranial nerve deficit.  Skin: Skin is warm and dry.  Psychiatric: He has a normal mood and affect.    ED Course  Procedures (including critical care time) Labs Review Labs Reviewed  CBC WITH DIFFERENTIAL - Abnormal; Notable for the following:    Platelets 115 (*)    Monocytes  Relative 17 (*)    Monocytes Absolute 1.4 (*)    All other components within normal limits  I-STAT CHEM 8, ED - Abnormal; Notable for the following:    BUN 24 (*)    Creatinine, Ser 1.50 (*)    Glucose, Bld 105 (*)    All other components within normal limits  I-STAT TROPOININ, ED   Imaging Review  Dg Chest 2 View  11/23/2013   CLINICAL DATA:  Cough, chest pain  EXAM: CHEST  2 VIEW  COMPARISON:  12/27/2012  FINDINGS: Cardiomegaly again noted. 4 leads cardiac pacemaker is unchanged in position. No acute infiltrate or pulmonary edema. Mild elevation of the right hemidiaphragm again noted.  IMPRESSION: No active cardiopulmonary disease.   Electronically Signed   By: Lahoma Crocker M.D.   On: 11/23/2013 16:56     EKG Interpretation None      Date: 11/23/2013  Rate: 58  Rhythm: sinus bradycardia  QRS Axis: normal  Intervals: normal  ST/T Wave abnormalities: normal  Conduction Disutrbances:right bundle branch block  Narrative Interpretation: Same morphology as his previous EKG that was paced, however no pacer spikes visible  Old EKG Reviewed: changes noted   MDM   Final diagnoses:  URI (upper respiratory infection)    Patient with cough been likely viral syndrome. X-ray does not show pneumonia. Laboratory reassuring. He does not have fevers. He is well-appearing and will be discharged home and followup as needed.    Jasper Riling. Alvino Chapel, MD 11/23/13 1757

## 2013-12-15 ENCOUNTER — Encounter (HOSPITAL_COMMUNITY): Payer: Self-pay | Admitting: Emergency Medicine

## 2013-12-15 ENCOUNTER — Emergency Department (HOSPITAL_COMMUNITY)
Admission: EM | Admit: 2013-12-15 | Discharge: 2013-12-15 | Disposition: A | Discharge status: Home / Self Care | Payer: Medicare HMO | Attending: Emergency Medicine | Admitting: Emergency Medicine

## 2013-12-15 DIAGNOSIS — E785 Hyperlipidemia, unspecified: Secondary | ICD-10-CM | POA: Diagnosis not present

## 2013-12-15 DIAGNOSIS — Z794 Long term (current) use of insulin: Secondary | ICD-10-CM | POA: Insufficient documentation

## 2013-12-15 DIAGNOSIS — R011 Cardiac murmur, unspecified: Secondary | ICD-10-CM | POA: Diagnosis not present

## 2013-12-15 DIAGNOSIS — Z862 Personal history of diseases of the blood and blood-forming organs and certain disorders involving the immune mechanism: Secondary | ICD-10-CM | POA: Insufficient documentation

## 2013-12-15 DIAGNOSIS — Z8669 Personal history of other diseases of the nervous system and sense organs: Secondary | ICD-10-CM | POA: Diagnosis not present

## 2013-12-15 DIAGNOSIS — I252 Old myocardial infarction: Secondary | ICD-10-CM | POA: Insufficient documentation

## 2013-12-15 DIAGNOSIS — I509 Heart failure, unspecified: Secondary | ICD-10-CM | POA: Insufficient documentation

## 2013-12-15 DIAGNOSIS — Z95810 Presence of automatic (implantable) cardiac defibrillator: Secondary | ICD-10-CM | POA: Diagnosis not present

## 2013-12-15 DIAGNOSIS — Z87891 Personal history of nicotine dependence: Secondary | ICD-10-CM | POA: Insufficient documentation

## 2013-12-15 DIAGNOSIS — Z79899 Other long term (current) drug therapy: Secondary | ICD-10-CM | POA: Insufficient documentation

## 2013-12-15 DIAGNOSIS — N390 Urinary tract infection, site not specified: Secondary | ICD-10-CM | POA: Insufficient documentation

## 2013-12-15 DIAGNOSIS — Z8673 Personal history of transient ischemic attack (TIA), and cerebral infarction without residual deficits: Secondary | ICD-10-CM | POA: Diagnosis not present

## 2013-12-15 DIAGNOSIS — I10 Essential (primary) hypertension: Secondary | ICD-10-CM | POA: Diagnosis not present

## 2013-12-15 DIAGNOSIS — E119 Type 2 diabetes mellitus without complications: Secondary | ICD-10-CM | POA: Diagnosis not present

## 2013-12-15 DIAGNOSIS — M1610 Unilateral primary osteoarthritis, unspecified hip: Secondary | ICD-10-CM | POA: Insufficient documentation

## 2013-12-15 DIAGNOSIS — K5289 Other specified noninfective gastroenteritis and colitis: Secondary | ICD-10-CM | POA: Insufficient documentation

## 2013-12-15 DIAGNOSIS — Z9861 Coronary angioplasty status: Secondary | ICD-10-CM | POA: Insufficient documentation

## 2013-12-15 DIAGNOSIS — Z860100 Personal history of colon polyps, unspecified: Secondary | ICD-10-CM | POA: Insufficient documentation

## 2013-12-15 DIAGNOSIS — I2510 Atherosclerotic heart disease of native coronary artery without angina pectoris: Secondary | ICD-10-CM | POA: Insufficient documentation

## 2013-12-15 DIAGNOSIS — R197 Diarrhea, unspecified: Secondary | ICD-10-CM | POA: Diagnosis present

## 2013-12-15 DIAGNOSIS — Z7982 Long term (current) use of aspirin: Secondary | ICD-10-CM | POA: Insufficient documentation

## 2013-12-15 DIAGNOSIS — K529 Noninfective gastroenteritis and colitis, unspecified: Secondary | ICD-10-CM

## 2013-12-15 LAB — COMPREHENSIVE METABOLIC PANEL
ALK PHOS: 66 U/L (ref 39–117)
ALT: 10 U/L (ref 0–53)
AST: 18 U/L (ref 0–37)
Albumin: 3.5 g/dL (ref 3.5–5.2)
BILIRUBIN TOTAL: 0.9 mg/dL (ref 0.3–1.2)
BUN: 18 mg/dL (ref 6–23)
CHLORIDE: 99 meq/L (ref 96–112)
CO2: 24 meq/L (ref 19–32)
Calcium: 10.1 mg/dL (ref 8.4–10.5)
Creatinine, Ser: 1.31 mg/dL (ref 0.50–1.35)
GFR, EST AFRICAN AMERICAN: 58 mL/min — AB (ref >90)
GFR, EST NON AFRICAN AMERICAN: 50 mL/min — AB (ref >90)
Glucose, Bld: 329 mg/dL — ABNORMAL HIGH (ref 70–99)
Potassium: 4.7 mEq/L (ref 3.7–5.3)
Sodium: 136 mEq/L — ABNORMAL LOW (ref 137–147)
Total Protein: 7.7 g/dL (ref 6.0–8.3)

## 2013-12-15 LAB — CBC WITH DIFFERENTIAL/PLATELET
Basophils Absolute: 0 10*3/uL (ref 0.0–0.1)
Basophils Relative: 0 % (ref 0–1)
EOS ABS: 0.1 10*3/uL (ref 0.0–0.7)
Eosinophils Relative: 0 % (ref 0–5)
HCT: 45.6 % (ref 39.0–52.0)
Hemoglobin: 15.5 g/dL (ref 13.0–17.0)
Lymphocytes Relative: 10 % — ABNORMAL LOW (ref 12–46)
Lymphs Abs: 2 10*3/uL (ref 0.7–4.0)
MCH: 31.2 pg (ref 26.0–34.0)
MCHC: 34 g/dL (ref 30.0–36.0)
MCV: 91.8 fL (ref 78.0–100.0)
MONOS PCT: 11 % (ref 3–12)
Monocytes Absolute: 2.2 10*3/uL — ABNORMAL HIGH (ref 0.1–1.0)
NEUTROS ABS: 16 10*3/uL — AB (ref 1.7–7.7)
Neutrophils Relative %: 79 % — ABNORMAL HIGH (ref 43–77)
Platelets: 142 10*3/uL — ABNORMAL LOW (ref 150–400)
RBC: 4.97 MIL/uL (ref 4.22–5.81)
RDW: 13.8 % (ref 11.5–15.5)
WBC: 20.2 10*3/uL — ABNORMAL HIGH (ref 4.0–10.5)

## 2013-12-15 LAB — URINE MICROSCOPIC-ADD ON

## 2013-12-15 LAB — URINALYSIS, ROUTINE W REFLEX MICROSCOPIC
Glucose, UA: >1000 mg/dL — AB
Ketones, ur: 15 mg/dL — AB
Nitrite: POSITIVE — AB
Protein, ur: 100 mg/dL — AB
Specific Gravity, Urine: 1.038 — ABNORMAL HIGH (ref 1.005–1.030)
Urobilinogen, UA: 1 mg/dL (ref 0.0–1.0)
pH: 5 (ref 5.0–8.0)

## 2013-12-15 MED ORDER — SODIUM CHLORIDE 0.9 % IV SOLN
Freq: Once | INTRAVENOUS | Status: AC
  Administered 2013-12-15: 11:00:00 via INTRAVENOUS

## 2013-12-15 MED ORDER — CEPHALEXIN 500 MG PO CAPS: 1000.0000 mg | ORAL_CAPSULE | Freq: Two times a day (BID) | ORAL | Status: DC

## 2013-12-15 NOTE — ED Provider Notes (Signed)
CSN: [id number]     Arrival date & time 12/15/13  8341 History   First MD Initiated Contact with Patient 12/15/13 [phone]     Chief Complaint  Patient presents with  . Emesis  . Diarrhea     (Consider location/radiation/quality/duration/timing/severity/associated sxs/prior Treatment) HPI: [name] is an 80 year old man who presents to the Emergency Department with chief complaint of vomiting and diarrhea.  He reports that he woke up suddenly at 4am and had to run to the bathroom, where he had his first episode of emesis and diarrhea.  He has had two total episodes of vomiting and diarrhea.  He reports that both the emesis and diarrhea are watery and brown.  He denies fever, abdominal pain, anorexia, and dysuria.  He is not currently nauseated.  He reports that he has no sick contacts with similar symptoms and did not eat anything unusual last night.  He states that he has no history of abdominal surgeries.  Past Medical History  Diagnosis Date  . Anemia   . Cataracts, bilateral   . CHF (congestive heart failure)     ischemic CM EF 15-20% s/p AICD 05/24/04  . CAD (coronary artery disease)     s/p AMI, s/p PTCA & stent of cx 12/04 restent 5/05  . Diverticulosis of colon   . Hyperlipidemia   . PVD (peripheral vascular disease)     s/p L carotid PTCA/stent 2004  . Transient ischemic attack   . Erectile dysfunction   . Hyperkalemia 08/2008    K=5.7   . BBB (bundle branch block)     right  . HTN (hypertension)   . Elevated PSA   . Adenomatous colon polyp 02/14/2012  . ICD (implantable cardiac defibrillator) in place 12-25-2012    MDT CRTD upgrade by Dr Lovena Le  . Pacemaker   . Type II diabetes mellitus   . Myocardial infarction 1990  . Arthritis     "right hip" (12/25/2012)   Past Surgical History  Procedure Laterality Date  . Cardiac defibrillator placement  05/24/2004    Implantation of a MDT single-chamber defibrillator  . Carotid stent  09/11/2003    Percutaneous transluminal  angioplasty and stent placement of the left internal carotid artery.  . Biv icd upgrade  12/25/2012    MDT CRTD upgrade by Dr Lovena Le for ischemic cardiomyopathy and worsening conduction system disease  . Cataract extraction w/ intraocular lens  implant, bilateral Bilateral ~ 2010  . Cardiac catheterization  06/2003,  01/2004  . Coronary angioplasty with stent placement  1990    "2" (12/25/2012)   Family History  Problem Relation Age of Onset  . Diabetes Mother   . Diabetes Brother    History  Substance Use Topics  . Smoking status: Former Smoker    Types: Cigarettes    Quit date: 12/27/1967  . Smokeless tobacco: Never Used  . Alcohol Use: No     Comment: 12/25/2012 "quit all alcohol 60 yr ago"    Review of Systems  All other systems negative except as documented in the HPI. All pertinent positives and negatives as reviewed in the HPI.  Allergies  Review of patient's allergies indicates no known allergies.  Home Medications   Current Outpatient Rx  Name  Route  Sig  Dispense  Refill  . aspirin 81 MG tablet   Oral   Take 81 mg by mouth 2 (two) times daily.          . B-D ULTRAFINE III SHORT  PEN 31G X 8 MM MISC      USE AS DIRECTED   100 each   2   . Blood Glucose Monitoring Suppl (FREESTYLE FREEDOM LITE) W/DEVICE KIT      Use to check blood sugar as directed.   1 each   0   . carvedilol (COREG) 25 MG tablet   Oral   Take 1 tablet (25 mg total) by mouth 2 (two) times daily with a meal.   60 tablet   6   . glucose blood test strip      Use to check blood sugar 2 times a day as directed. Dx: ICD-9 250.00, insulin requiring.   100 each   12   . Insulin Glargine (LANTUS SOLOSTAR) 100 UNIT/ML SOPN   Subcutaneous   Inject 40 Units into the skin at bedtime.   15 mL   6   . pantoprazole (PROTONIX) 40 MG tablet   Oral   Take 1 tablet (40 mg total) by mouth daily.   30 tablet   6   . simvastatin (ZOCOR) 40 MG tablet   Oral   Take 1 tablet (40 mg total) by  mouth daily.   30 tablet   6    BP 136/79  Pulse 67  Temp(Src) 97.3 F (36.3 C) (Oral)  Resp 20  Ht $R'5\' 10"'tF$  (1.778 m)  Wt 180 lb (81.647 kg)  BMI 25.83 kg/m2  SpO2 98% Physical Exam  Nursing note and vitals reviewed. Constitutional: He is oriented to person, place, and time. He appears well-developed and well-nourished. No distress.  Appears well, resting comfortably  HENT:  Head: Normocephalic and atraumatic.  Mouth/Throat: Oropharynx is clear and moist.  Eyes: Pupils are equal, round, and reactive to light.  Neck: Normal range of motion. Neck supple.  Cardiovascular: Normal rate and regular rhythm.   Murmur heard. Systolic murmur II/VI best heard LSB 4th ICS  Pulmonary/Chest: Effort normal and breath sounds normal.  Abdominal: Soft. Bowel sounds are normal. He exhibits no distension. There is no tenderness. There is no rebound.  Neurological: He is alert and oriented to person, place, and time. He exhibits normal muscle tone. Coordination normal.  Skin: Skin is warm and dry. No erythema.    ED Course  Procedures (including critical care time) Labs Review Labs Reviewed  CBC WITH DIFFERENTIAL - Abnormal; Notable for the following:    WBC 20.2 (*)    Platelets 142 (*)    Neutrophils Relative % 79 (*)    Neutro Abs 16.0 (*)    Lymphocytes Relative 10 (*)    Monocytes Absolute 2.2 (*)    All other components within normal limits  COMPREHENSIVE METABOLIC PANEL - Abnormal; Notable for the following:    Sodium 136 (*)    Glucose, Bld 329 (*)    GFR calc non Af Amer 50 (*)    GFR calc Af Amer 58 (*)    All other components within normal limits  URINALYSIS, ROUTINE W REFLEX MICROSCOPIC - Abnormal; Notable for the following:    Color, Urine ORANGE (*)    APPearance CLOUDY (*)    Specific Gravity, Urine 1.038 (*)    Glucose, UA >1000 (*)    Hgb urine dipstick MODERATE (*)    Bilirubin Urine SMALL (*)    Ketones, ur 15 (*)    Protein, ur 100 (*)    Nitrite POSITIVE (*)     Leukocytes, UA MODERATE (*)    All other components within normal  limits  URINE MICROSCOPIC-ADD ON - Abnormal; Notable for the following:    Squamous Epithelial / LPF FEW (*)    Bacteria, UA MANY (*)    Casts HYALINE CASTS (*)    All other components within normal limits     EKG Interpretation   Date/Time:  Monday December 15 2013 12:40:08 EDT Ventricular Rate:  70 PR Interval:  144 QRS Duration: 166 QT Interval:  488 QTC Calculation: 527 R Axis:   -104 Text Interpretation:  Atrial-sensed ventricular-paced rhythm Abnormal ECG  No significant change since last tracing Confirmed by GOLDSTON  MD, SCOTT  ([id number]) on 12/15/2013 12:54:22 PM      Patient has tolerated oral fluid trial.  Will treat for an asymptomatic UTI.  Patient is advised to return here as needed.  Advised to follow up with his primary care Dr. for recheck patient has had no symptoms here in the emergency department.  He is also having no abdominal pain   Brent General, PA-C 12/15/13 1328

## 2013-12-15 NOTE — ED Notes (Signed)
Pt denies any nausea or diarrhea after po challenge

## 2013-12-15 NOTE — ED Notes (Signed)
PA student at bedside.

## 2013-12-15 NOTE — ED Notes (Addendum)
Pt called [facility] from [facility] for diarrhea and vomiting since this am.  States 3 episodes of diarrhea that were "nothing but water". Denies abdominal pian.  CBG 329 other vs stable.

## 2013-12-15 NOTE — Discharge Instructions (Signed)
Return here as needed. Follow up with your doctor. Slowly increase your fluid intake.

## 2013-12-18 ENCOUNTER — Ambulatory Visit (INDEPENDENT_AMBULATORY_CARE_PROVIDER_SITE_OTHER): Payer: Medicare HMO | Admitting: Internal Medicine

## 2013-12-18 ENCOUNTER — Encounter: Payer: Self-pay | Admitting: Dietician

## 2013-12-18 ENCOUNTER — Encounter: Payer: Self-pay | Admitting: Internal Medicine

## 2013-12-18 VITALS — BP 127/63 | HR 66 | Temp 97.8°F | Ht 70.5 in | Wt 183.7 lb

## 2013-12-18 DIAGNOSIS — K5289 Other specified noninfective gastroenteritis and colitis: Secondary | ICD-10-CM

## 2013-12-18 DIAGNOSIS — K529 Noninfective gastroenteritis and colitis, unspecified: Secondary | ICD-10-CM | POA: Insufficient documentation

## 2013-12-18 DIAGNOSIS — E119 Type 2 diabetes mellitus without complications: Secondary | ICD-10-CM

## 2013-12-18 DIAGNOSIS — N390 Urinary tract infection, site not specified: Secondary | ICD-10-CM

## 2013-12-18 LAB — GLUCOSE, CAPILLARY: GLUCOSE-CAPILLARY: 356 mg/dL — AB (ref 70–99)

## 2013-12-18 LAB — POCT GLYCOSYLATED HEMOGLOBIN (HGB A1C): HEMOGLOBIN A1C: 9.4

## 2013-12-18 LAB — HM DIABETES EYE EXAM

## 2013-12-18 MED ORDER — INSULIN GLARGINE 100 UNIT/ML SOLOSTAR PEN: 44.0000 [IU] | PEN_INJECTOR | Freq: Every day | SUBCUTANEOUS | Status: DC

## 2013-12-18 NOTE — ED Provider Notes (Signed)
Medical screening examination/treatment/procedure(s) were conducted as a shared visit with non-physician practitioner(s) and myself.  I personally evaluated the patient during the encounter.   EKG Interpretation   Date/Time:  Monday December 15 2013 12:40:08 EDT Ventricular Rate:  70 PR Interval:  144 QRS Duration: 166 QT Interval:  488 QTC Calculation: 527 R Axis:   -104 Text Interpretation:  Atrial-sensed ventricular-paced rhythm Abnormal ECG  No significant change since last tracing Confirmed by Shaheim Mahar  MD, Seda Kronberg  ([id number]) on 12/15/2013 12:54:22 PM      Patient is well appearing here, benign exam. Moist mucous membranes on my exam. Able to take PO. Has no abdominal pain in history or exam. Feels well. Has asymptomtic UTI with WBC of 20, but no fevers. As he is well appearing and currently asymptomatic, will treat as if UTI and follow up with PCP.  Ephraim Hamburger, MD 12/18/13 [phone]

## 2013-12-18 NOTE — Assessment & Plan Note (Addendum)
Lab Results  Component Value Date   HGBA1C 9.4 12/18/2013   HGBA1C 8.0 09/03/2013   HGBA1C 9.0 02/19/2013     Assessment: Diabetes control: poor control (HgbA1C >9%) Progress toward A1C goal:  deteriorated Comments: recent illness and dietary indiscretion like etiology of worsen HgbA1c  Plan: Medications:  continue current medications Home glucose monitoring: Frequency:   Timing:   Instruction/counseling given: reminded to get eye exam and reminded to bring blood glucose meter & log to each visit Educational resources provided: brochure Self management tools provided:   Other plans: pt resistant to increasing Lantus from 40 to 44 units because her states "his girl friend wont stay with him" because the Lantus causes sexual difficulty.   -agreed to increase Lantus to 44 units and get more testing strips so that we can see his cbgs pattern -pt states his cbgs are "always in the 90s" but cbg >300 today in clinic -retinal scan today

## 2013-12-18 NOTE — Progress Notes (Signed)
   Subjective:    Patient ID: [name], male    DOB: [birth year], 80 y.o.   MRN: [id number]  HPI  Presents for ED f/u of gastroenteritis and UTI.  Pt states nausea, vomiting, and diarrhea has completely resolved.  Lasted ~4 days.  Reports compliance with antibx prescribed (Keflex).  No fever, chest pain, abdominal pain, shortness of breath, or dysuria.  Hx significant for DM Type 2 with recent HgbA1c of 8 on Lantus 40 units qd.  States that he has not been able to get testing strips but will "work on it" since his sugar is elevated and HgbA1c to has increased to 9.4.  States that he had been eating "Buffalo wings" which he thinks got caused his sickness and raised his "sugar".  Review of Systems  Constitutional: Negative.   HENT: Negative.   Eyes: Negative.   Respiratory: Negative.   Cardiovascular: Negative.   Gastrointestinal: Negative.   Endocrine: Negative.   Genitourinary: Negative.   Musculoskeletal: Negative.   Skin: Negative.   Allergic/Immunologic: Negative.   Neurological: Negative.   Hematological: Negative.   Psychiatric/Behavioral: Negative.        Objective:   Physical Exam  Constitutional: He is oriented to person, place, and time. He appears well-developed and well-nourished. No distress.  HENT:  Head: Normocephalic and atraumatic.  Eyes: Conjunctivae and EOM are normal. Pupils are equal, round, and reactive to light.  Neck: Normal range of motion. Neck supple.  Cardiovascular: Normal rate and regular rhythm.   Pulmonary/Chest: Effort normal and breath sounds normal.  Abdominal: Soft. Bowel sounds are normal.  Musculoskeletal: Normal range of motion.  Neurological: He is alert and oriented to person, place, and time.  Skin: Skin is warm and dry.  Psychiatric: He has a normal mood and affect.          Assessment & Plan:  See separate problem-based charting for full A/P:  1. DM: increase Lantus to 44 units qd

## 2013-12-18 NOTE — Assessment & Plan Note (Signed)
Resolved after 4 days

## 2013-12-18 NOTE — Assessment & Plan Note (Signed)
Resolved symptomatically.  Currently taking course of Kelfex 500 mg bid x 7 days

## 2013-12-18 NOTE — Patient Instructions (Addendum)
General Instructions: It was nice to meet you [name] and hear that stomach virus and bladder infection is getting better. Please increase your Lantus to 44 units and pick up the testing strips. Bring the meter with you when you return to clinic in 1 month. Thank you for bringing your medicines today. This helps [place] keep you safe from mistakes.  Treatment Goals:  Goals (1 Years of Data) as of 12/18/13         As of Today 09/03/13 02/19/13 04/03/12 02/14/12     Result Component    . HEMOGLOBIN A1C < 7.0  9.4 8.0 9.0 8.5     . LDL CALC < 100    70  83      Progress Toward Treatment Goals:  Treatment Goal 12/18/2013  Hemoglobin A1C deteriorated    Self Care Goals & Plans:  Self Care Goal 12/18/2013  Manage my medications take my medicines as prescribed; bring my medications to every visit; refill my medications on time  Monitor my health -  Eat healthy foods drink diet soda or water instead of juice or soda; eat more vegetables; eat foods that are low in salt; eat baked foods instead of fried foods; eat fruit for snacks and desserts  Be physically active -    Home Blood Glucose Monitoring 09/03/2013  Check my blood sugar 2 times a day  When to check my blood sugar before breakfast; before dinner     Care Management & Community Referrals:  Referral 09/03/2013  Referrals made for care management support none needed  Referrals made to community resources none

## 2013-12-22 NOTE — Progress Notes (Signed)
Case discussed with Dr. Schooler soon after the resident saw the patient.  We reviewed the resident's history and exam and pertinent patient test results.  I agree with the assessment, diagnosis, and plan of care documented in the resident's note. 

## 2013-12-31 ENCOUNTER — Encounter: Payer: Self-pay | Admitting: Internal Medicine

## 2013-12-31 ENCOUNTER — Ambulatory Visit (INDEPENDENT_AMBULATORY_CARE_PROVIDER_SITE_OTHER): Payer: Commercial Managed Care - HMO | Admitting: Internal Medicine

## 2013-12-31 VITALS — BP 100/63 | HR 50 | Ht 70.5 in | Wt 183.0 lb

## 2013-12-31 DIAGNOSIS — I255 Ischemic cardiomyopathy: Secondary | ICD-10-CM

## 2013-12-31 DIAGNOSIS — I509 Heart failure, unspecified: Secondary | ICD-10-CM

## 2013-12-31 DIAGNOSIS — I2589 Other forms of chronic ischemic heart disease: Secondary | ICD-10-CM

## 2013-12-31 LAB — MDC_IDC_ENUM_SESS_TYPE_INCLINIC
Battery Voltage: 2.97 V
Brady Statistic AP VS Percent: 0.01 %
Brady Statistic AS VP Percent: 90.81 %
Brady Statistic AS VS Percent: 0.01 %
Brady Statistic RA Percent Paced: 9.18 %
Brady Statistic RV Percent Paced: 99.85 %
HIGH POWER IMPEDANCE MEASURED VALUE: 50 Ohm
HighPow Impedance: 171 Ohm
Lead Channel Impedance Value: 342 Ohm
Lead Channel Impedance Value: 475 Ohm
Lead Channel Impedance Value: 532 Ohm
Lead Channel Impedance Value: 722 Ohm
Lead Channel Pacing Threshold Pulse Width: 0.4 ms
Lead Channel Pacing Threshold Pulse Width: 0.6 ms
Lead Channel Sensing Intrinsic Amplitude: 1.75 mV
Lead Channel Sensing Intrinsic Amplitude: 2.25 mV
Lead Channel Setting Pacing Amplitude: 1.5 V
Lead Channel Setting Pacing Amplitude: 2 V
MDC IDC MSMT BATTERY REMAINING LONGEVITY: 94 mo
MDC IDC MSMT LEADCHNL LV PACING THRESHOLD AMPLITUDE: 0.875 V
MDC IDC MSMT LEADCHNL RA IMPEDANCE VALUE: 418 Ohm
MDC IDC MSMT LEADCHNL RA PACING THRESHOLD AMPLITUDE: 0.625 V
MDC IDC MSMT LEADCHNL RA PACING THRESHOLD PULSEWIDTH: 0.4 ms
MDC IDC MSMT LEADCHNL RV PACING THRESHOLD AMPLITUDE: 0.875 V
MDC IDC MSMT LEADCHNL RV SENSING INTR AMPL: 19.5 mV
MDC IDC MSMT LEADCHNL RV SENSING INTR AMPL: 26.5 mV
MDC IDC SESS DTM: 20150408175416
MDC IDC SET LEADCHNL LV PACING PULSEWIDTH: 0.6 ms
MDC IDC SET LEADCHNL RV PACING AMPLITUDE: 2 V
MDC IDC SET LEADCHNL RV PACING PULSEWIDTH: 0.4 ms
MDC IDC SET LEADCHNL RV SENSING SENSITIVITY: 0.3 mV
MDC IDC SET ZONE DETECTION INTERVAL: 250 ms
MDC IDC SET ZONE DETECTION INTERVAL: 450 ms
MDC IDC STAT BRADY AP VP PERCENT: 9.17 %
Zone Setting Detection Interval: 300 ms
Zone Setting Detection Interval: 350 ms
Zone Setting Detection Interval: 360 ms

## 2013-12-31 NOTE — Patient Instructions (Signed)
Remote monitoring is used to monitor your ICD from home. This monitoring reduces the number of office visits required to check your device to one time per year. It allows [place] to keep an eye on the functioning of your device to ensure it is working properly. You are scheduled for a device check from home on 04-06-2014. You may send your transmission at any time that day. If you have a wireless device, the transmission will be sent automatically. After your physician reviews your transmission, you will receive a postcard with your next transmission date.  Your physician recommends that you schedule a follow-up appointment in: 12 months with Dr.Taylor

## 2013-12-31 NOTE — Progress Notes (Signed)
HPI [name] returns today for followup. He is a pleasant 80 yo man with a h/o an ischemic cardiomyopathy, chronic systolic heart failure, ejection fraction 15%, complete heart block, status post biventricular ICD. In the interim, the patient has been stable. He underwent ICD generator removal and upgrade to a biventricular device several months ago. He has done well since. He denies chest pain, syncope, or ICD shock. He has class II congestive heart failure. He has mild peripheral edema. He admits to dietary indiscretion. No Known Allergies   Current Outpatient Prescriptions  Medication Sig Dispense Refill  . aspirin 81 MG tablet Take 81 mg by mouth daily.       . carvedilol (COREG) 25 MG tablet Take 1 tablet (25 mg total) by mouth 2 (two) times daily with a meal.  60 tablet  6  . Insulin Glargine (LANTUS SOLOSTAR) 100 UNIT/ML Solostar Pen Inject 44 Units into the skin at bedtime.  15 mL  6  . pantoprazole (PROTONIX) 40 MG tablet Take 1 tablet (40 mg total) by mouth daily.  30 tablet  6  . simvastatin (ZOCOR) 40 MG tablet Take 1 tablet (40 mg total) by mouth daily.  30 tablet  6   No current facility-administered medications for this visit.     Past Medical History  Diagnosis Date  . Anemia   . Cataracts, bilateral   . CHF (congestive heart failure)     ischemic CM EF 15-20% s/p AICD 05/24/04  . CAD (coronary artery disease)     s/p AMI, s/p PTCA & stent of cx 12/04 restent 5/05  . Diverticulosis of colon   . Hyperlipidemia   . PVD (peripheral vascular disease)     s/p L carotid PTCA/stent 2004  . Transient ischemic attack   . Erectile dysfunction   . Hyperkalemia 08/2008    K=5.7   . BBB (bundle branch block)     right  . HTN (hypertension)   . Elevated PSA   . Adenomatous colon polyp 02/14/2012  . ICD (implantable cardiac defibrillator) in place 12-25-2012    MDT CRTD upgrade by Dr Lovena Le  . Pacemaker   . Type II diabetes mellitus   . Myocardial infarction 1990  .  Arthritis     "right hip" (12/25/2012)    ROS:   All systems reviewed and negative except as noted in the HPI.   Past Surgical History  Procedure Laterality Date  . Cardiac defibrillator placement  05/24/2004    Implantation of a MDT single-chamber defibrillator  . Carotid stent  09/11/2003    Percutaneous transluminal angioplasty and stent placement of the left internal carotid artery.  . Biv icd upgrade  12/25/2012    MDT CRTD upgrade by Dr Lovena Le for ischemic cardiomyopathy and worsening conduction system disease  . Cataract extraction w/ intraocular lens  implant, bilateral Bilateral ~ 2010  . Cardiac catheterization  06/2003,  01/2004  . Coronary angioplasty with stent placement  1990    "2" (12/25/2012)     Family History  Problem Relation Age of Onset  . Diabetes Mother   . Diabetes Brother      History   Social History  . Marital Status: Widowed    Spouse Name: N/A    Number of Children: N/A  . Years of Education: 12   Occupational History  . retired    Social History Main Topics  . Smoking status: Former Smoker    Types: Cigarettes    Quit date: 12/27/1967  .  Smokeless tobacco: Never Used  . Alcohol Use: No     Comment: 12/25/2012 "quit all alcohol 60 yr ago"  . Drug Use: No  . Sexual Activity: Yes   Other Topics Concern  . Not on file   Social History Narrative   Single, 2 adult children, daughter in [place], son in [place]     BP 100/63  Pulse 50  Ht 5' 10.5" (1.791 m)  Wt 183 lb (83.008 kg)  BMI 25.88 kg/m2  Physical Exam:  stable appearing 80 year old man, NAD HEENT: Unremarkable Neck:  7 cm JVD, no thyromegally Lungs:  Clear with no wheezes, rales, or rhonchi. HEART:  Regular rate rhythm, no murmurs, no rubs, no clicks Abd:  soft, positive bowel sounds, no organomegally, no rebound, no guarding Ext:  2 plus pulses, 2+ peripheral edema, no cyanosis, no clubbing Skin:  No rashes no nodules Neuro:  CN II through XII intact, motor  grossly intact  EKG - normal sinus rhythm with AV sequential biventricular pacing  DEVICE  Normal device function.  See PaceArt for details.   Assess/Plan:

## 2014-03-18 ENCOUNTER — Other Ambulatory Visit: Payer: Self-pay | Admitting: Internal Medicine

## 2014-04-06 ENCOUNTER — Encounter: Payer: Commercial Managed Care - HMO | Admitting: *Deleted

## 2014-04-07 ENCOUNTER — Telehealth: Payer: Self-pay | Admitting: Cardiology

## 2014-04-07 NOTE — Telephone Encounter (Signed)
Spoke with pt and reminded pt of remote transmission that is due yesterday. Pt verbalized understanding.

## 2014-05-06 ENCOUNTER — Other Ambulatory Visit: Payer: Self-pay | Admitting: Internal Medicine

## 2014-05-07 NOTE — Telephone Encounter (Signed)
Please schedule a follow-up appointment with me when available.

## 2014-06-12 ENCOUNTER — Encounter (HOSPITAL_COMMUNITY): Payer: Self-pay | Admitting: Emergency Medicine

## 2014-06-12 ENCOUNTER — Emergency Department (INDEPENDENT_AMBULATORY_CARE_PROVIDER_SITE_OTHER)
Admission: EM | Admit: 2014-06-12 | Discharge: 2014-06-12 | Disposition: A | Discharge status: Home / Self Care | Payer: Commercial Managed Care - HMO | Source: Home / Self Care

## 2014-06-12 DIAGNOSIS — J3489 Other specified disorders of nose and nasal sinuses: Secondary | ICD-10-CM

## 2014-06-12 DIAGNOSIS — J069 Acute upper respiratory infection, unspecified: Secondary | ICD-10-CM

## 2014-06-12 DIAGNOSIS — R0981 Nasal congestion: Secondary | ICD-10-CM

## 2014-06-12 MED ORDER — ALBUTEROL SULFATE HFA 108 (90 BASE) MCG/ACT IN AERS: 1.0000 | INHALATION_SPRAY | Freq: Four times a day (QID) | RESPIRATORY_TRACT | Status: DC | PRN

## 2014-06-12 NOTE — ED Provider Notes (Signed)
CSN: [id number]     Arrival date & time 06/12/14  1608 History   None    Chief Complaint  Patient presents with  . URI   (Consider location/radiation/quality/duration/timing/severity/associated sxs/prior Treatment) HPI Comments: Patient presents with 4 days of nasal congestion after riding on a cold bus. He has no fatigue or fever. Does not feel bad. Mild cough; history of COPD refill on Inhaler.   Patient is a 80 y.o. male presenting with URI. The history is provided by the patient.  URI Presenting symptoms: congestion and cough   Presenting symptoms: no ear pain, no fatigue and no fever   Associated symptoms: no wheezing     Past Medical History  Diagnosis Date  . Anemia   . Cataracts, bilateral   . CHF (congestive heart failure)     ischemic CM EF 15-20% s/p AICD 05/24/04  . CAD (coronary artery disease)     s/p AMI, s/p PTCA & stent of cx 12/04 restent 5/05  . Diverticulosis of colon   . Hyperlipidemia   . PVD (peripheral vascular disease)     s/p L carotid PTCA/stent 2004  . Transient ischemic attack   . Erectile dysfunction   . Hyperkalemia 08/2008    K=5.7   . BBB (bundle branch block)     right  . HTN (hypertension)   . Elevated PSA   . Adenomatous colon polyp 02/14/2012  . ICD (implantable cardiac defibrillator) in place 12-25-2012    MDT CRTD upgrade by Dr Lovena Le  . Pacemaker   . Type II diabetes mellitus   . Myocardial infarction 1990  . Arthritis     "right hip" (12/25/2012)   Past Surgical History  Procedure Laterality Date  . Cardiac defibrillator placement  05/24/2004    Implantation of a MDT single-chamber defibrillator  . Carotid stent  09/11/2003    Percutaneous transluminal angioplasty and stent placement of the left internal carotid artery.  . Biv icd upgrade  12/25/2012    MDT CRTD upgrade by Dr Lovena Le for ischemic cardiomyopathy and worsening conduction system disease  . Cataract extraction w/ intraocular lens  implant, bilateral Bilateral ~ 2010  .  Cardiac catheterization  06/2003,  01/2004  . Coronary angioplasty with stent placement  1990    "2" (12/25/2012)   Family History  Problem Relation Age of Onset  . Diabetes Mother   . Diabetes Brother    History  Substance Use Topics  . Smoking status: Former Smoker    Types: Cigarettes    Quit date: 12/27/1967  . Smokeless tobacco: Never Used  . Alcohol Use: No     Comment: 12/25/2012 "quit all alcohol 60 yr ago"    Review of Systems  Constitutional: Negative for fever, chills, fatigue and unexpected weight change.  HENT: Positive for congestion, postnasal drip and sinus pressure. Negative for ear discharge and ear pain.   Eyes: Negative.   Respiratory: Positive for cough. Negative for shortness of breath and wheezing.   Skin: Negative.   All other systems reviewed and are negative.   Allergies  Review of patient's allergies indicates no known allergies.  Home Medications   Prior to Admission medications   Medication Sig Start Date End Date Taking? Authorizing Provider  aspirin 81 MG tablet Take 81 mg by mouth daily.    Yes Historical Provider, MD  carvedilol (COREG) 25 MG tablet Take 1 tablet (25 mg total) by mouth 2 (two) times daily with a meal. 09/03/13  Yes Axel Filler, MD  Insulin Glargine (LANTUS SOLOSTAR) 100 UNIT/ML Solostar Pen Inject 44 Units into the skin at bedtime. 12/18/13  Yes Valaria Good, MD  simvastatin (ZOCOR) 40 MG tablet Take 1 tablet (40 mg total) by mouth daily. 05/07/14  Yes Axel Filler, MD  albuterol (PROVENTIL HFA;VENTOLIN HFA) 108 (90 BASE) MCG/ACT inhaler Inhale 1-2 puffs into the lungs every 6 (six) hours as needed for wheezing or shortness of breath. 06/12/14   Bjorn Pippin, PA-C  Insulin Pen Needle (B-D ULTRAFINE III SHORT PEN) 31G X 8 MM MISC Use as directed for once daily insulin injection. 03/19/14   Axel Filler, MD  pantoprazole (PROTONIX) 40 MG tablet Take 1 tablet (40 mg total) by mouth daily. 05/07/14   Axel Filler, MD   BP 178/81  Pulse 82  Temp(Src) 97.9 F (36.6 C) (Oral)  Resp 12  SpO2 100% Physical Exam  Nursing note and vitals reviewed. Constitutional: He is oriented to person, place, and time. He appears well-developed and well-nourished. No distress.  HENT:  Head: Normocephalic and atraumatic.  Right Ear: External ear normal.  Left Ear: External ear normal.  Mouth/Throat: Oropharynx is clear and moist.  Eyes: Pupils are equal, round, and reactive to light.  Neck: Normal range of motion.  Cardiovascular: Normal rate and regular rhythm.   Pulmonary/Chest: Effort normal. He has wheezes.  Mild exp wheeze in bases  Neurological: He is alert and oriented to person, place, and time.  Skin: Skin is warm and dry. He is not diaphoretic.  Psychiatric: His behavior is normal.    ED Course  Procedures (including critical care time) Labs Review Labs Reviewed - No data to display  Imaging Review No results found.   MDM   1. URI (upper respiratory infection)   2. Nasal congestion    No indication for an antibiotic. Instructions given on OTC treatments. Inhaler prescription updated if needed.     Bjorn Pippin, PA-C 06/12/14 1755

## 2014-06-12 NOTE — ED Provider Notes (Signed)
Medical screening examination/treatment/procedure(s) were performed by resident physician or non-physician practitioner and as supervising physician I was immediately available for consultation/collaboration.   Pauline Good MD.   Billy Fischer, MD 06/12/14 [phone]

## 2014-06-12 NOTE — ED Notes (Signed)
C/o cold sx onset 4 days Sx include: SOB, wheezing, productive cough, chest tightness, runny nose Denies f/v/n/d Alert, no signs of acute distress.

## 2014-06-12 NOTE — Discharge Instructions (Signed)
Upper Respiratory Infection, Adult An upper respiratory infection (URI) is also sometimes known as the common cold. The upper respiratory tract includes the nose, sinuses, throat, trachea, and bronchi. Bronchi are the airways leading to the lungs. Most people improve within 1 week, but symptoms can last up to 2 weeks. A residual cough may last even longer.  CAUSES Many different viruses can infect the tissues lining the upper respiratory tract. The tissues become irritated and inflamed and often become very moist. Mucus production is also common. A cold is contagious. You can easily spread the virus to others by oral contact. This includes kissing, sharing a glass, coughing, or sneezing. Touching your mouth or nose and then touching a surface, which is then touched by another person, can also spread the virus. SYMPTOMS  Symptoms typically develop 1 to 3 days after you come in contact with a cold virus. Symptoms vary from person to person. They may include:  Runny nose.  Sneezing.  Nasal congestion.  Sinus irritation.  Sore throat.  Loss of voice (laryngitis).  Cough.  Fatigue.  Muscle aches.  Loss of appetite.  Headache.  Low-grade fever. DIAGNOSIS  You might diagnose your own cold based on familiar symptoms, since most people get a cold 2 to 3 times a year. Your caregiver can confirm this based on your exam. Most importantly, your caregiver can check that your symptoms are not due to another disease such as strep throat, sinusitis, pneumonia, asthma, or epiglottitis. Blood tests, throat tests, and X-rays are not necessary to diagnose a common cold, but they may sometimes be helpful in excluding other more serious diseases. Your caregiver will decide if any further tests are required. RISKS AND COMPLICATIONS  You may be at risk for a more severe case of the common cold if you smoke cigarettes, have chronic heart disease (such as heart failure) or lung disease (such as asthma), or if  you have a weakened immune system. The very [place] and very old are also at risk for more serious infections. Bacterial sinusitis, middle ear infections, and bacterial pneumonia can complicate the common cold. The common cold can worsen asthma and chronic obstructive pulmonary disease (COPD). Sometimes, these complications can require emergency medical care and may be life-threatening. PREVENTION  The best way to protect against getting a cold is to practice good hygiene. Avoid oral or hand contact with people with cold symptoms. Wash your hands often if contact occurs. There is no clear evidence that vitamin C, vitamin E, echinacea, or exercise reduces the chance of developing a cold. However, it is always recommended to get plenty of rest and practice good nutrition. TREATMENT  Treatment is directed at relieving symptoms. There is no cure. Antibiotics are not effective, because the infection is caused by a virus, not by bacteria. Treatment may include:  Increased fluid intake. Sports drinks offer valuable electrolytes, sugars, and fluids.  Breathing heated mist or steam (vaporizer or shower).  Eating chicken soup or other clear broths, and maintaining good nutrition.  Getting plenty of rest.  Using gargles or lozenges for comfort.  Controlling fevers with ibuprofen or acetaminophen as directed by your caregiver.  Increasing usage of your inhaler if you have asthma. Zinc gel and zinc lozenges, taken in the first 24 hours of the common cold, can shorten the duration and lessen the severity of symptoms. Pain medicines may help with fever, muscle aches, and throat pain. A variety of non-prescription medicines are available to treat congestion and runny nose. Your caregiver  can make recommendations and may suggest nasal or lung inhalers for other symptoms.  HOME CARE INSTRUCTIONS   Only take over-the-counter or prescription medicines for pain, discomfort, or fever as directed by your  caregiver.  Use a warm mist humidifier or inhale steam from a shower to increase air moisture. This may keep secretions moist and make it easier to breathe.  Drink enough water and fluids to keep your urine clear or pale yellow.  Rest as needed.  Return to work when your temperature has returned to normal or as your caregiver advises. You may need to stay home longer to avoid infecting others. You can also use a face mask and careful hand washing to prevent spread of the virus. SEEK MEDICAL CARE IF:   After the first few days, you feel you are getting worse rather than better.  You need your caregiver's advice about medicines to control symptoms.  You develop chills, worsening shortness of breath, or brown or red sputum. These may be signs of pneumonia.  You develop yellow or brown nasal discharge or pain in the face, especially when you bend forward. These may be signs of sinusitis.  You develop a fever, swollen neck glands, pain with swallowing, or white areas in the back of your throat. These may be signs of strep throat. SEEK IMMEDIATE MEDICAL CARE IF:   You have a fever.  You develop severe or persistent headache, ear pain, sinus pain, or chest pain.  You develop wheezing, a prolonged cough, cough up blood, or have a change in your usual mucus (if you have chronic lung disease).  You develop sore muscles or a stiff neck. Document Released: 03/07/2001 Document Revised: 12/04/2011 Document Reviewed: 12/17/2013 [facility] Patient Information 2015 [place], [place]. This information is not intended to replace advice given to you by your health care provider. Make sure you discuss any questions you have with your health care provider.     Use Mucinex OTC as directed every 12 hours with 4 oz of water for the next 4 days. If begin to feel poorly or have fevers follow up. No indication for an antibitoic.

## 2014-06-23 ENCOUNTER — Encounter: Payer: Self-pay | Admitting: *Deleted

## 2014-07-08 ENCOUNTER — Encounter: Payer: Self-pay | Admitting: Cardiology

## 2014-07-14 ENCOUNTER — Other Ambulatory Visit: Payer: Self-pay | Admitting: Internal Medicine

## 2014-07-29 ENCOUNTER — Ambulatory Visit (HOSPITAL_COMMUNITY)
Admission: RE | Admit: 2014-07-29 | Discharge: 2014-07-29 | Disposition: A | Discharge status: Home / Self Care | Payer: Medicare HMO | Source: Ambulatory Visit | Attending: Internal Medicine | Admitting: Internal Medicine

## 2014-07-29 ENCOUNTER — Encounter: Payer: Self-pay | Admitting: Internal Medicine

## 2014-07-29 ENCOUNTER — Ambulatory Visit (INDEPENDENT_AMBULATORY_CARE_PROVIDER_SITE_OTHER): Payer: Commercial Managed Care - HMO | Admitting: Internal Medicine

## 2014-07-29 VITALS — BP 125/56 | HR 60 | Temp 97.7°F | Ht 70.5 in | Wt 184.7 lb

## 2014-07-29 DIAGNOSIS — E119 Type 2 diabetes mellitus without complications: Secondary | ICD-10-CM

## 2014-07-29 DIAGNOSIS — M25551 Pain in right hip: Secondary | ICD-10-CM

## 2014-07-29 DIAGNOSIS — I517 Cardiomegaly: Secondary | ICD-10-CM | POA: Insufficient documentation

## 2014-07-29 DIAGNOSIS — R0602 Shortness of breath: Secondary | ICD-10-CM | POA: Insufficient documentation

## 2014-07-29 DIAGNOSIS — E785 Hyperlipidemia, unspecified: Secondary | ICD-10-CM

## 2014-07-29 DIAGNOSIS — D126 Benign neoplasm of colon, unspecified: Secondary | ICD-10-CM

## 2014-07-29 DIAGNOSIS — R0609 Other forms of dyspnea: Secondary | ICD-10-CM

## 2014-07-29 DIAGNOSIS — I509 Heart failure, unspecified: Secondary | ICD-10-CM

## 2014-07-29 DIAGNOSIS — I255 Ischemic cardiomyopathy: Secondary | ICD-10-CM

## 2014-07-29 LAB — COMPLETE METABOLIC PANEL WITH GFR
ALBUMIN: 3.5 g/dL (ref 3.5–5.2)
ALK PHOS: 62 U/L (ref 39–117)
ALT: 12 U/L (ref 0–53)
AST: 20 U/L (ref 0–37)
BUN: 19 mg/dL (ref 6–23)
CO2: 28 mEq/L (ref 19–32)
Calcium: 9.7 mg/dL (ref 8.4–10.5)
Chloride: 105 mEq/L (ref 96–112)
Creat: 1.3 mg/dL (ref 0.50–1.35)
GFR, EST NON AFRICAN AMERICAN: 51 mL/min — AB
GFR, Est African American: 59 mL/min — ABNORMAL LOW
Glucose, Bld: 103 mg/dL — ABNORMAL HIGH (ref 70–99)
POTASSIUM: 4.5 meq/L (ref 3.5–5.3)
SODIUM: 142 meq/L (ref 135–145)
TOTAL PROTEIN: 7.6 g/dL (ref 6.0–8.3)
Total Bilirubin: 0.6 mg/dL (ref 0.3–1.2)

## 2014-07-29 LAB — CBC WITH DIFFERENTIAL/PLATELET
BASOS PCT: 1 % (ref 0–1)
Basophils Absolute: 0.1 10*3/uL (ref 0.0–0.1)
Eosinophils Absolute: 0.3 10*3/uL (ref 0.0–0.7)
Eosinophils Relative: 4 % (ref 0–5)
HEMATOCRIT: 42.8 % (ref 39.0–52.0)
Hemoglobin: 14.2 g/dL (ref 13.0–17.0)
LYMPHS PCT: 42 % (ref 12–46)
Lymphs Abs: 2.6 10*3/uL (ref 0.7–4.0)
MCH: 30.2 pg (ref 26.0–34.0)
MCHC: 33.2 g/dL (ref 30.0–36.0)
MCV: 91.1 fL (ref 78.0–100.0)
MONOS PCT: 9 % (ref 3–12)
Monocytes Absolute: 0.6 10*3/uL (ref 0.1–1.0)
NEUTROS ABS: 2.8 10*3/uL (ref 1.7–7.7)
Neutrophils Relative %: 44 % (ref 43–77)
Platelets: 149 10*3/uL — ABNORMAL LOW (ref 150–400)
RBC: 4.7 MIL/uL (ref 4.22–5.81)
RDW: 13.7 % (ref 11.5–15.5)
WBC: 6.3 10*3/uL (ref 4.0–10.5)

## 2014-07-29 LAB — LIPID PANEL
Cholesterol: 127 mg/dL (ref 0–200)
HDL: 45 mg/dL (ref >39)
LDL Cholesterol: 69 mg/dL (ref 0–99)
TRIGLYCERIDES: 63 mg/dL (ref <150)
Total CHOL/HDL Ratio: 2.8 Ratio
VLDL: 13 mg/dL (ref 0–40)

## 2014-07-29 LAB — GLUCOSE, CAPILLARY: GLUCOSE-CAPILLARY: 120 mg/dL — AB (ref 70–99)

## 2014-07-29 LAB — PRO B NATRIURETIC PEPTIDE: PRO B NATRI PEPTIDE: 1036 pg/mL — AB (ref <451)

## 2014-07-29 LAB — POCT GLYCOSYLATED HEMOGLOBIN (HGB A1C): HEMOGLOBIN A1C: 7.8

## 2014-07-29 MED ORDER — FUROSEMIDE 20 MG PO TABS: 20.0000 mg | ORAL_TABLET | Freq: Every day | ORAL | Status: DC

## 2014-07-29 NOTE — Assessment & Plan Note (Addendum)
Lab Results  Component Value Date   HGBA1C 7.8 07/29/2014   HGBA1C 9.4 12/18/2013   HGBA1C 8.0 09/03/2013     Assessment: Diabetes control: fair control Progress toward A1C goal:  improved Comments: hemoglobin A1c has improved from 9.4 in March of this year to 7.8 today on Lantus insulin 44 units daily at bedtime.  Plan: Medications:  continue current medications, and refer to diabetes educator Home glucose monitoring: Frequency: 2 times a day Timing: before breakfast, before dinner Instruction/counseling given: discussed diet Other plans: check comprehensive metabolic panel and CBC today.

## 2014-07-29 NOTE — Assessment & Plan Note (Signed)
Lipids:    Component Value Date/Time   CHOL 146 02/19/2013 1055   TRIG 125 02/19/2013 1055   HDL 51 02/19/2013 1055   LDLCALC 70 02/19/2013 1055   VLDL 25 02/19/2013 1055   CHOLHDL 2.9 02/19/2013 1055    Assessment: Patient is on simvastatin 40 mg daily without any apparent side effects.  Plan: Check a lipid panel today.

## 2014-07-29 NOTE — Patient Instructions (Signed)
Start furosemide 20 mg one tablet daily.   Please schedule an appointment with diabetes educator Debera Lat.

## 2014-07-29 NOTE — Progress Notes (Signed)
   Subjective:    Patient ID: [name], male    DOB: [birth year], 80 y.o.   MRN: [id number]  HPI Patient presents for management of his diabetes mellitus, hypertension, ischemic cardiomyopathy with congestive heart failure, hyperlipidemia, and other chronic medical problems.  His main complaint today is right hip pain which is aggravated by standing or walking; this is chronic but has been worse recently.  He also reports on review of systems occasional episodes of exertional dyspnea relieved by rest.  He denies orthopnea or PND.  He denies any chest pain.  He reports that he has active, and has otherwise been feeling well.  He reports that he has been compliant with his medications   Review of Systems  Constitutional: Negative for fever, chills and diaphoresis.  Eyes: Negative for visual disturbance.  Respiratory: Positive for shortness of breath (Occasional exertional shortness of breath). Negative for cough and wheezing.   Cardiovascular: Negative for chest pain and leg swelling.  Gastrointestinal: Negative for nausea, vomiting, abdominal pain, blood in stool and anal bleeding.  Endocrine: Negative for polyuria.  Genitourinary: Negative for dysuria.  Musculoskeletal: Positive for arthralgias (Right hip pain).    I reviewed and updated the medications, allergies, past medical history, past surgical history, family history, and social history.      Objective:   Physical Exam  Constitutional: No distress.  Cardiovascular: Normal rate, regular rhythm and normal heart sounds.  Exam reveals no gallop and no friction rub.   No murmur heard. 2+ bilateral pitting lower extremity edema  Pulmonary/Chest: Effort normal and breath sounds normal. No respiratory distress. He has no wheezes. He has no rales.  Abdominal: Soft. Bowel sounds are normal. He exhibits no distension. There is no hepatomegaly. There is no tenderness. There is no rebound and no guarding.        Assessment & Plan:

## 2014-07-29 NOTE — Assessment & Plan Note (Signed)
Wt Readings from Last 3 Encounters:  07/29/14 184 lb 11.2 oz (83.779 kg)  12/31/13 183 lb (83.008 kg)  12/18/13 183 lb 11.2 oz (83.326 kg)    BNP    Component Value Date/Time   PROBNP 1036* 07/29/2014 1102    Dg Chest 2 View  07/29/2014   CLINICAL DATA:  80 year old male with exertional shortness of breath. History of myocardial infarctions, coronary artery disease and pacemaker. Subsequent encounter.  EXAM: CHEST  2 VIEW  COMPARISON:  11/23/2013 and earlier.  FINDINGS: Stable cardiomegaly and mediastinal contours. Left chest cardiac AICD appears stable. Visualized tracheal air column is within normal limits. Lung volumes are within normal limits. No pneumothorax, pulmonary edema, pleural effusion or confluent pulmonary opacity. Stable visualized osseous structures. Flowing osteophytes in the thoracic spine.  IMPRESSION: Stable cardiomegaly. No acute cardiopulmonary abnormality.   Electronically Signed   By: Lars Pinks M.D.   On: 07/29/2014 13:07    Assessment: Patient presents today with exertional dyspnea and 2+ bilateral leg edema; he denies orthopnea or PND.  His exam shows no evidence of pulmonary edema, and his chest x-ray shows stable cardiomegaly with no acute abnormality.  Patient has a history of ischemic cardiomyopathy with systolic heart failure; he has no recent anginal symptoms.  He is not on a diuretic or an ACE inhibitor.  He is followed by cardiologist Dr. Lovena Le.   Plan: Start furosemide 20 mg daily; 2-D echocardiogram; patient will return in one week for re-evaluation.  At that time, addition of an ACE inhibitor or ARB should be considered.  I also advised patient to follow up with Dr. Lovena Le.

## 2014-07-29 NOTE — Assessment & Plan Note (Addendum)
Wt Readings from Last 3 Encounters:  07/29/14 184 lb 11.2 oz (83.779 kg)  12/31/13 183 lb (83.008 kg)  12/18/13 183 lb 11.2 oz (83.326 kg)    BNP    Component Value Date/Time   PROBNP 1036* 07/29/2014 1102    Dg Chest 2 View  07/29/2014   CLINICAL DATA:  80 year old male with exertional shortness of breath. History of myocardial infarctions, coronary artery disease and pacemaker. Subsequent encounter.  EXAM: CHEST  2 VIEW  COMPARISON:  11/23/2013 and earlier.  FINDINGS: Stable cardiomegaly and mediastinal contours. Left chest cardiac AICD appears stable. Visualized tracheal air column is within normal limits. Lung volumes are within normal limits. No pneumothorax, pulmonary edema, pleural effusion or confluent pulmonary opacity. Stable visualized osseous structures. Flowing osteophytes in the thoracic spine.  IMPRESSION: Stable cardiomegaly. No acute cardiopulmonary abnormality.   Electronically Signed   By: Lars Pinks M.D.   On: 07/29/2014 13:07    Assessment: Patient presents today with exertional dyspnea and 2+ bilateral leg edema; he denies orthopnea or PND.  His exam shows no evidence of pulmonary edema, and his chest x-ray shows stable cardiomegaly with no acute abnormality.  Patient has a history of ischemic cardiomyopathy with systolic heart failure; he has no recent anginal symptoms.  He is not on a diuretic or an ACE inhibitor.  He is followed by cardiologist Dr. Lovena Le.   Plan: Start furosemide 20 mg daily; 2-D echocardiogram; patient will return in one week for re-evaluation.  At that time, addition of an ACE inhibitor or ARB should be considered.  I also advised patient to follow up with Dr. Lovena Le.

## 2014-07-29 NOTE — Assessment & Plan Note (Signed)
Assessment: Patient had an adenomatous colonic polyp by colonoscopy in 2010 by Dr. Teena Irani.  Five-year follow-up was recommended.  Plan: Refer to Dr. Amedeo Plenty for colonoscopy.

## 2014-07-29 NOTE — Assessment & Plan Note (Signed)
Dg Hip Complete Right  07/29/2014   CLINICAL DATA:  80 year old male with acute right hip pain, non radiating. Initial encounter.  EXAM: RIGHT HIP - COMPLETE 2+ VIEW  COMPARISON:  None.  FINDINGS: External artifact from abdominal a brace, and external densities overlying the right lower extremity. Femoral heads are normally located. Hip joint spaces are fairly symmetric and preserved. There is mild osteophytosis and subchondral sclerosis at both hips, slightly greater on the right. Proximal right femur intact. Bone mineralization is within normal limits. A age pelvis intact. SI joints within normal limits. Widespread osteophytosis in the lumbar spine. Enthesophytosis about the pelvis. Extensive Aortoiliac calcified atherosclerosis noted, with vascular calcifications extending into the lower extremities.  IMPRESSION: No acute osseous abnormality identified about the right hip or femur. Suspect diffuse idiopathic skeletal hyperostosis, but otherwise mild for age hip joint degeneration.   Electronically Signed   By: Lars Pinks M.D.   On: 07/29/2014 13:08   Assessment: Patient presents with chronic right hip pain aggravated by standing or walking.  Hip x-ray today shows only mild hip joint degeneration, but does also show widespread osteophytosis of the lumbar spine which raises a question of diffuse idiopathic skeletal hyperostosis (DISH).  Given the relatively unremarkable right hip x-ray, it is possible that his pain is referred from his lumbosacral spine disease.    Plan: I discussed these findings with the patient.  His pain is reasonably well controlled with occasional use of acetaminophen, so I advised him to continue that medication; we discussed the option of an oral opioid but at this time he would prefer to go with the acetaminophen.  Given his AICD, we cannot do an MRI of his lumbosacral spine; per discussion with radiology, a noncontrasted CT scan would be the best option to further image his spine if  needed.  Patient has significant heart disease, and is not an ideal surgical candidate; if his pain is low back in origin, his symptoms currently do not appear to be surgical (no weakness, no numbness, pain that is not severe).  Plan is to treat conservatively with acetaminophen; I advised him to let me know if his pain worsens or if he develops any neurologic symptoms such as weakness or numbness, in which case further imaging of his lumbosacral spine with a noncontrasted CT scan would be warranted.

## 2014-08-05 ENCOUNTER — Ambulatory Visit: Payer: Commercial Managed Care - HMO | Admitting: Dietician

## 2014-08-05 ENCOUNTER — Ambulatory Visit (INDEPENDENT_AMBULATORY_CARE_PROVIDER_SITE_OTHER): Payer: Commercial Managed Care - HMO | Admitting: Internal Medicine

## 2014-08-05 ENCOUNTER — Encounter: Payer: Self-pay | Admitting: Internal Medicine

## 2014-08-05 ENCOUNTER — Other Ambulatory Visit: Payer: Self-pay | Admitting: Internal Medicine

## 2014-08-05 VITALS — BP 157/76 | HR 64 | Temp 97.9°F | Ht 70.0 in | Wt 182.6 lb

## 2014-08-05 DIAGNOSIS — M25551 Pain in right hip: Secondary | ICD-10-CM

## 2014-08-05 DIAGNOSIS — E119 Type 2 diabetes mellitus without complications: Secondary | ICD-10-CM

## 2014-08-05 DIAGNOSIS — I5022 Chronic systolic (congestive) heart failure: Secondary | ICD-10-CM

## 2014-08-05 DIAGNOSIS — R600 Localized edema: Secondary | ICD-10-CM

## 2014-08-05 LAB — BASIC METABOLIC PANEL WITH GFR
BUN: 21 mg/dL (ref 6–23)
CO2: 29 mEq/L (ref 19–32)
Calcium: 9.8 mg/dL (ref 8.4–10.5)
Chloride: 100 mEq/L (ref 96–112)
Creat: 1.36 mg/dL — ABNORMAL HIGH (ref 0.50–1.35)
GFR, EST AFRICAN AMERICAN: 56 mL/min — AB
GFR, Est Non African American: 48 mL/min — ABNORMAL LOW
Glucose, Bld: 239 mg/dL — ABNORMAL HIGH (ref 70–99)
Potassium: 4.2 mEq/L (ref 3.5–5.3)
Sodium: 138 mEq/L (ref 135–145)

## 2014-08-05 LAB — GLUCOSE, CAPILLARY: Glucose-Capillary: 219 mg/dL — ABNORMAL HIGH (ref 70–99)

## 2014-08-05 MED ORDER — LISINOPRIL 2.5 MG PO TABS: 2.5000 mg | ORAL_TABLET | Freq: Every day | ORAL | Status: DC

## 2014-08-05 NOTE — Patient Instructions (Addendum)
General Instructions: Please follow up in 2-3 months with Dr. Marinda Elk  We will start you on low dose Lisinopril today We will check blood work today Please follow with cardiology 09/01/14 at 11 am and get an echo of your heart    Thank you for bringing your medicines today. This helps [place] keep you safe from mistakes.   Progress Toward Treatment Goals:  Treatment Goal 08/05/2014  Hemoglobin A1C at goal    Self Care Goals & Plans:  Self Care Goal 08/05/2014  Manage my medications take my medicines as prescribed; bring my medications to every visit; refill my medications on time; follow the sick day instructions if I am sick  Monitor my health -  Eat healthy foods drink diet soda or water instead of juice or soda; eat more vegetables; eat foods that are low in salt; eat baked foods instead of fried foods; eat fruit for snacks and desserts; eat smaller portions  Be physically active find an activity I enjoy  Meeting treatment goals maintain the current self-care plan    Home Blood Glucose Monitoring 08/05/2014  Check my blood sugar 2 times a day  When to check my blood sugar N/A; before meals     Care Management & Community Referrals:  Referral 08/05/2014  Referrals made for care management support none needed  Referrals made to community resources none         Treatment Goals:  Goals (1 Years of Data) as of 08/05/14          07/29/14 12/18/13 09/03/13 02/19/13     Result Component   . HEMOGLOBIN A1C < 7.0  7.8 9.4 8.0 9.0   . LDL CALC < 100  69   70      Progress Toward Treatment Goals:  Treatment Goal 08/05/2014  Hemoglobin A1C at goal    Self Care Goals & Plans:  Self Care Goal 08/05/2014  Manage my medications take my medicines as prescribed; bring my medications to every visit; refill my medications on time; follow the sick day instructions if I am sick  Monitor my health -  Eat healthy foods drink diet soda or water instead of juice or soda; eat more  vegetables; eat foods that are low in salt; eat baked foods instead of fried foods; eat fruit for snacks and desserts; eat smaller portions  Be physically active find an activity I enjoy  Meeting treatment goals maintain the current self-care plan    Home Blood Glucose Monitoring 08/05/2014  Check my blood sugar 2 times a day  When to check my blood sugar N/A; before meals     Care Management & Community Referrals:  Referral 08/05/2014  Referrals made for care management support none needed  Referrals made to community resources none     Edema Edema is an abnormal buildup of fluids in your bodytissues. Edema is somewhatdependent on gravity to pull the fluid to the lowest place in your body. That makes the condition more common in the legs and thighs (lower extremities). Painless swelling of the feet and ankles is common and becomes more likely as you get older. It is also common in looser tissues, like around your eyes.  When the affected area is squeezed, the fluid may move out of that spot and leave a dent for a few moments. This dent is called pitting.  CAUSES  There are many possible causes of edema. Eating too much salt and being on your feet or sitting for a long  time can cause edema in your legs and ankles. Hot weather may make edema worse. Common medical causes of edema include:  Heart failure.  Liver disease.  Kidney disease.  Weak blood vessels in your legs.  Cancer.  An injury.  Pregnancy.  Some medications.  Obesity. SYMPTOMS  Edema is usually painless.Your skin may look swollen or shiny.  DIAGNOSIS  Your health care provider may be able to diagnose edema by asking about your medical history and doing a physical exam. You may need to have tests such as X-rays, an electrocardiogram, or blood tests to check for medical conditions that may cause edema.  TREATMENT  Edema treatment depends on the cause. If you have heart, liver, or kidney disease, you need the  treatment appropriate for these conditions. General treatment may include:  Elevation of the affected body part above the level of your heart.  Compression of the affected body part. Pressure from elastic bandages or support stockings squeezes the tissues and forces fluid back into the blood vessels. This keeps fluid from entering the tissues.  Restriction of fluid and salt intake.  Use of a water pill (diuretic). These medications are appropriate only for some types of edema. They pull fluid out of your body and make you urinate more often. This gets rid of fluid and reduces swelling, but diuretics can have side effects. Only use diuretics as directed by your health care provider. HOME CARE INSTRUCTIONS   Keep the affected body part above the level of your heart when you are lying down.   Do not sit still or stand for prolonged periods.   Do not put anything directly under your knees when lying down.  Do not wear constricting clothing or garters on your upper legs.   Exercise your legs to work the fluid back into your blood vessels. This may help the swelling go down.   Wear elastic bandages or support stockings to reduce ankle swelling as directed by your health care provider.   Eat a low-salt diet to reduce fluid if your health care provider recommends it.   Only take medicines as directed by your health care provider. SEEK MEDICAL CARE IF:   Your edema is not responding to treatment.  You have heart, liver, or kidney disease and notice symptoms of edema.  You have edema in your legs that does not improve after elevating them.   You have sudden and unexplained weight gain. SEEK IMMEDIATE MEDICAL CARE IF:   You develop shortness of breath or chest pain.   You cannot breathe when you lie down.  You develop pain, redness, or warmth in the swollen areas.   You have heart, liver, or kidney disease and suddenly get edema.  You have a fever and your symptoms  suddenly get worse. MAKE SURE YOU:   Understand these instructions.  Will watch your condition.  Will get help right away if you are not doing well or get worse. Document Released: 09/11/2005 Document Revised: 01/26/2014 Document Reviewed: 07/04/2013 [facility] Patient Information 2015 [place], [place]. This information is not intended to replace advice given to you by your health care provider. Make sure you discuss any questions you have with your health care provider.

## 2014-08-05 NOTE — Progress Notes (Signed)
Patient left without being seen.

## 2014-08-05 NOTE — Assessment & Plan Note (Addendum)
Systolic chronic Pending echo. Will f/u with Richardson Dopp 09/01/14 at 11 am Last week added low dose Lasix 20 which improved leg edema and sob will continue and check BMET today  Added low dose Lisinopril 2.5 mg qd

## 2014-08-05 NOTE — Assessment & Plan Note (Addendum)
Lab Results  Component Value Date   HGBA1C 7.8 07/29/2014   HGBA1C 9.4 12/18/2013   HGBA1C 8.0 09/03/2013     Assessment: Diabetes control: good control (HgbA1C at goal) Progress toward A1C goal:  at goal Comments: none; cbg 219 today   Plan: Medications:  continue current medications (Lantus 44 units qhs) Home glucose monitoring: Frequency: 2 times a day Timing: N/A, before meals Instruction/counseling given: discussed foot care podiatry appt this Friday Educational resources provided: brochure (has information) Self management tools provided:no Other plans: f/u in 2-3 months with PCP, check urine microAlb/Creatinine today

## 2014-08-05 NOTE — Progress Notes (Signed)
   Subjective:    Patient ID: [name], male    DOB: [birth year], 80 y.o.   MRN: [id number]  HPI Comments: 80 y.o pmh DM 2, chronic ischemic heart disease, chronic systolic CHF, complete heart failure with AICD, HLD, chronic right hip pain   He was seen by PCP Dr. Marinda Elk last week and presents for f/u for: BP 157/76 then 128/59 1. Leg edema-started on lasix 20 mg last week. Here to see if improved with Lasix and it has.  Swelling is better per pt and his feet feel better and ligher.   2. SOB-SOB is better this week.  pending echo to be scheduled and patient supposed to f/u with cardiology it has been since 12/2013. He is not on ACEI/ARB and needs one with h/o heart failure last echo 2006 EF 30-40% h/o EF 10-15%.  He will f/u with cardiology 09/01/14 at 11 AM and echo pending to be approved by insurance then scheduled. 3. Right hip pain relived by Tylenol 500 mg 6 pills in 1 day and in no pain today.     HM -GI referral pending -podiatry appt this Friday  SH -served in [employer] for >20 years, lived in [place] for 3 years, 2 kids (son and daughter), 1 26 y.o grandson, he has not heard from daughter living in [place] for 3 years and son is in federal prison for trying to kill a girl for 12 years.   -lives alone     Review of Systems  Respiratory: Negative for shortness of breath.   Cardiovascular: Negative for chest pain.  Gastrointestinal: Negative for constipation.       Objective:   Physical Exam  Constitutional: He is oriented to person, place, and time. Vital signs are normal. He appears well-developed and well-nourished. He is cooperative.  Pleasant   HENT:  Head: Normocephalic and atraumatic.  Mouth/Throat: He has dentures. No oropharyngeal exudate.  Eyes: Conjunctivae are normal. Right eye exhibits no discharge. Left eye exhibits no discharge. No scleral icterus.  Cardiovascular: Normal rate, regular rhythm, S1 normal, S2 normal and normal heart sounds.   No murmur  heard. Trace lower ext edema b/l  Pulmonary/Chest: Effort normal and breath sounds normal.  Abdominal: Soft. Bowel sounds are normal. He exhibits no distension. There is no tenderness.  Musculoskeletal: He exhibits no edema.  Neurological: He is alert and oriented to person, place, and time. Gait normal.  Skin: Skin is warm and dry. No rash noted.  B/l shins with hyper and hypo/pigmentation, lichenification   Psychiatric: He has a normal mood and affect. His speech is normal and behavior is normal. Judgment and thought content normal. Cognition and memory are normal.  Nursing note and vitals reviewed.         Assessment & Plan:  F/u in 2-3 months with Dr. Marinda Elk

## 2014-08-05 NOTE — Assessment & Plan Note (Addendum)
Likely 2/2 OA.  Improved with Tylenol

## 2014-08-06 LAB — MICROALBUMIN / CREATININE URINE RATIO
Creatinine, Urine: 26.6 mg/dL
Microalb Creat Ratio: 26.3 mg/g (ref 0.0–30.0)
Microalb, Ur: 0.7 mg/dL (ref <2.0)

## 2014-08-09 NOTE — Progress Notes (Signed)
INTERNAL MEDICINE TEACHING ATTENDING ADDENDUM - [place], MD: I reviewed and discussed at the time of visit with the resident Dr. McLean, the patient's medical history, physical examination, diagnosis and results of pertinent tests and treatment and I agree with the patient's care as documented.  

## 2014-08-28 ENCOUNTER — Emergency Department (HOSPITAL_COMMUNITY): Payer: Commercial Managed Care - HMO

## 2014-08-28 ENCOUNTER — Emergency Department (INDEPENDENT_AMBULATORY_CARE_PROVIDER_SITE_OTHER)
Admission: EM | Admit: 2014-08-28 | Discharge: 2014-08-28 | Disposition: A | Discharge status: Home / Self Care | Payer: Commercial Managed Care - HMO | Source: Home / Self Care

## 2014-08-28 ENCOUNTER — Encounter (HOSPITAL_COMMUNITY): Payer: Self-pay | Admitting: *Deleted

## 2014-08-28 ENCOUNTER — Other Ambulatory Visit: Payer: Self-pay

## 2014-08-28 ENCOUNTER — Ambulatory Visit (HOSPITAL_COMMUNITY): Payer: Commercial Managed Care - HMO

## 2014-08-28 ENCOUNTER — Observation Stay (HOSPITAL_COMMUNITY)
Admission: EM | Admit: 2014-08-28 | Discharge: 2014-08-29 | Disposition: A | Discharge status: Home / Self Care | Payer: Commercial Managed Care - HMO | Attending: Internal Medicine | Admitting: Internal Medicine

## 2014-08-28 DIAGNOSIS — I739 Peripheral vascular disease, unspecified: Secondary | ICD-10-CM | POA: Diagnosis not present

## 2014-08-28 DIAGNOSIS — E785 Hyperlipidemia, unspecified: Secondary | ICD-10-CM | POA: Diagnosis not present

## 2014-08-28 DIAGNOSIS — Z950 Presence of cardiac pacemaker: Secondary | ICD-10-CM | POA: Insufficient documentation

## 2014-08-28 DIAGNOSIS — Z7982 Long term (current) use of aspirin: Secondary | ICD-10-CM | POA: Diagnosis not present

## 2014-08-28 DIAGNOSIS — N189 Chronic kidney disease, unspecified: Secondary | ICD-10-CM | POA: Insufficient documentation

## 2014-08-28 DIAGNOSIS — G8929 Other chronic pain: Secondary | ICD-10-CM | POA: Insufficient documentation

## 2014-08-28 DIAGNOSIS — I509 Heart failure, unspecified: Secondary | ICD-10-CM | POA: Diagnosis not present

## 2014-08-28 DIAGNOSIS — Z8673 Personal history of transient ischemic attack (TIA), and cerebral infarction without residual deficits: Secondary | ICD-10-CM | POA: Diagnosis not present

## 2014-08-28 DIAGNOSIS — I5022 Chronic systolic (congestive) heart failure: Secondary | ICD-10-CM | POA: Diagnosis not present

## 2014-08-28 DIAGNOSIS — I252 Old myocardial infarction: Secondary | ICD-10-CM | POA: Diagnosis not present

## 2014-08-28 DIAGNOSIS — M549 Dorsalgia, unspecified: Secondary | ICD-10-CM | POA: Diagnosis not present

## 2014-08-28 DIAGNOSIS — Z87891 Personal history of nicotine dependence: Secondary | ICD-10-CM | POA: Insufficient documentation

## 2014-08-28 DIAGNOSIS — Z955 Presence of coronary angioplasty implant and graft: Secondary | ICD-10-CM | POA: Diagnosis not present

## 2014-08-28 DIAGNOSIS — Z794 Long term (current) use of insulin: Secondary | ICD-10-CM | POA: Insufficient documentation

## 2014-08-28 DIAGNOSIS — Z95810 Presence of automatic (implantable) cardiac defibrillator: Secondary | ICD-10-CM | POA: Diagnosis not present

## 2014-08-28 DIAGNOSIS — I255 Ischemic cardiomyopathy: Secondary | ICD-10-CM | POA: Diagnosis not present

## 2014-08-28 DIAGNOSIS — E11649 Type 2 diabetes mellitus with hypoglycemia without coma: Secondary | ICD-10-CM | POA: Insufficient documentation

## 2014-08-28 DIAGNOSIS — I129 Hypertensive chronic kidney disease with stage 1 through stage 4 chronic kidney disease, or unspecified chronic kidney disease: Secondary | ICD-10-CM | POA: Insufficient documentation

## 2014-08-28 DIAGNOSIS — I2510 Atherosclerotic heart disease of native coronary artery without angina pectoris: Secondary | ICD-10-CM | POA: Insufficient documentation

## 2014-08-28 LAB — BASIC METABOLIC PANEL
ANION GAP: 10 (ref 5–15)
BUN: 19 mg/dL (ref 6–23)
CHLORIDE: 102 meq/L (ref 96–112)
CO2: 29 mEq/L (ref 19–32)
Calcium: 9.8 mg/dL (ref 8.4–10.5)
Creatinine, Ser: 1.31 mg/dL (ref 0.50–1.35)
GFR calc non Af Amer: 49 mL/min — ABNORMAL LOW (ref >90)
GFR, EST AFRICAN AMERICAN: 57 mL/min — AB (ref >90)
Glucose, Bld: 46 mg/dL — ABNORMAL LOW (ref 70–99)
POTASSIUM: 4.2 meq/L (ref 3.7–5.3)
SODIUM: 141 meq/L (ref 137–147)

## 2014-08-28 LAB — CBC
HCT: 46.3 % (ref 39.0–52.0)
HEMOGLOBIN: 14.9 g/dL (ref 13.0–17.0)
MCH: 30 pg (ref 26.0–34.0)
MCHC: 32.2 g/dL (ref 30.0–36.0)
MCV: 93.2 fL (ref 78.0–100.0)
PLATELETS: 169 10*3/uL (ref 150–400)
RBC: 4.97 MIL/uL (ref 4.22–5.81)
RDW: 14.5 % (ref 11.5–15.5)
WBC: 8.5 10*3/uL (ref 4.0–10.5)

## 2014-08-28 LAB — POCT I-STAT, CHEM 8
BUN: 20 mg/dL (ref 6–23)
CHLORIDE: 102 meq/L (ref 96–112)
Calcium, Ion: 1.26 mmol/L (ref 1.13–1.30)
Creatinine, Ser: 1.4 mg/dL — ABNORMAL HIGH (ref 0.50–1.35)
GLUCOSE: 73 mg/dL (ref 70–99)
HCT: 50 % (ref 39.0–52.0)
HEMOGLOBIN: 17 g/dL (ref 13.0–17.0)
Potassium: 4.4 mEq/L (ref 3.7–5.3)
Sodium: 141 mEq/L (ref 137–147)
TCO2: 27 mmol/L (ref 0–100)

## 2014-08-28 LAB — CBG MONITORING, ED: GLUCOSE-CAPILLARY: 74 mg/dL (ref 70–99)

## 2014-08-28 LAB — PRO B NATRIURETIC PEPTIDE: Pro B Natriuretic peptide (BNP): 1170 pg/mL — ABNORMAL HIGH (ref 0–450)

## 2014-08-28 LAB — TROPONIN I: Troponin I: <0.3 ng/mL (ref <0.30)

## 2014-08-28 LAB — GLUCOSE, CAPILLARY: Glucose-Capillary: 216 mg/dL — ABNORMAL HIGH (ref 70–99)

## 2014-08-28 MED ORDER — DEXTROSE 50 % IV SOLN: 25.0000 mL | Freq: Once | INTRAVENOUS | Status: DC

## 2014-08-28 MED ORDER — INSULIN ASPART 100 UNIT/ML ~~LOC~~ SOLN: 0.0000 [IU] | Freq: Three times a day (TID) | SUBCUTANEOUS | Status: DC

## 2014-08-28 MED ORDER — IPRATROPIUM-ALBUTEROL 0.5-2.5 (3) MG/3ML IN SOLN
3.0000 mL | Freq: Four times a day (QID) | RESPIRATORY_TRACT | Status: DC
  Administered 2014-08-28 – 2014-08-29 (×3): 3 mL via RESPIRATORY_TRACT

## 2014-08-28 MED ORDER — INSULIN GLARGINE 100 UNIT/ML ~~LOC~~ SOLN
15.0000 [IU] | Freq: Every day | SUBCUTANEOUS | Status: DC
  Administered 2014-08-28: 15 [IU] via SUBCUTANEOUS
  Filled 2014-08-28 (×2): qty 0.15

## 2014-08-28 MED ORDER — ASPIRIN EC 81 MG PO TBEC
81.0000 mg | DELAYED_RELEASE_TABLET | Freq: Every day | ORAL | Status: DC
  Administered 2014-08-29: 81 mg via ORAL
  Filled 2014-08-28: qty 1

## 2014-08-28 MED ORDER — FUROSEMIDE 10 MG/ML IJ SOLN
40.0000 mg | Freq: Once | INTRAMUSCULAR | Status: AC
  Administered 2014-08-28: 40 mg via INTRAVENOUS

## 2014-08-28 MED ORDER — SIMVASTATIN 40 MG PO TABS
40.0000 mg | ORAL_TABLET | Freq: Every day | ORAL | Status: DC
  Filled 2014-08-28: qty 1

## 2014-08-28 MED ORDER — ALBUTEROL SULFATE (2.5 MG/3ML) 0.083% IN NEBU: 2.5000 mg | INHALATION_SOLUTION | RESPIRATORY_TRACT | Status: DC | PRN

## 2014-08-28 MED ORDER — ACETAMINOPHEN 325 MG PO TABS: 650.0000 mg | ORAL_TABLET | ORAL | Status: DC | PRN

## 2014-08-28 MED ORDER — PANTOPRAZOLE SODIUM 40 MG PO TBEC
40.0000 mg | DELAYED_RELEASE_TABLET | Freq: Every day | ORAL | Status: DC
  Administered 2014-08-29: 40 mg via ORAL

## 2014-08-28 MED ORDER — IPRATROPIUM BROMIDE 0.02 % IN SOLN
0.5000 mg | Freq: Once | RESPIRATORY_TRACT | Status: AC
  Administered 2014-08-28: 0.5 mg via RESPIRATORY_TRACT

## 2014-08-28 MED ORDER — CARVEDILOL 25 MG PO TABS
25.0000 mg | ORAL_TABLET | Freq: Two times a day (BID) | ORAL | Status: DC
  Administered 2014-08-28 – 2014-08-29 (×2): 25 mg via ORAL
  Filled 2014-08-28 (×4): qty 1

## 2014-08-28 MED ORDER — ALBUTEROL SULFATE (5 MG/ML) 0.5% IN NEBU
5.0000 mg | INHALATION_SOLUTION | Freq: Once | RESPIRATORY_TRACT | Status: AC
  Administered 2014-08-28: 5 mg via RESPIRATORY_TRACT

## 2014-08-28 MED ORDER — HEPARIN SODIUM (PORCINE) 5000 UNIT/ML IJ SOLN
5000.0000 [IU] | Freq: Three times a day (TID) | INTRAMUSCULAR | Status: DC
  Administered 2014-08-28 – 2014-08-29 (×2): 5000 [IU] via SUBCUTANEOUS
  Filled 2014-08-28 (×4): qty 1

## 2014-08-28 MED ORDER — LISINOPRIL 2.5 MG PO TABS
2.5000 mg | ORAL_TABLET | Freq: Every day | ORAL | Status: DC
  Administered 2014-08-28 – 2014-08-29 (×2): 2.5 mg via ORAL
  Filled 2014-08-28 (×2): qty 1

## 2014-08-28 NOTE — ED Notes (Signed)
Provider      Unable  To  Chart  On  Epic  Transferred for  Treatment  Of  chf            By  Care  Link

## 2014-08-28 NOTE — ED Notes (Signed)
Pt has been sleeping soundly.  Reports feeling better.  Awaiting room.

## 2014-08-28 NOTE — ED Notes (Signed)
Pt  Reports    Shortness  Of  Breath         Wheezing           -   Symptoms  X  2  Days             He  Is  Sitting  Upright on the  Exam table  Reports  Feels  Short of breath

## 2014-08-28 NOTE — ED Notes (Signed)
Placed on 2L of O2 and on cardiac monitor per VO from Dr. Juventino Slovak.

## 2014-08-28 NOTE — ED Notes (Signed)
Attempted report 

## 2014-08-28 NOTE — ED Notes (Signed)
Pt  Placed  On  Cardiac monitor  Nasal o2  At  2  l  /  Min        Iv   Saline  Lok  20  Angio  r  Hand  1  Att

## 2014-08-28 NOTE — ED Notes (Signed)
Pt ambulatory to BR, no distress noted.  C/O some burning with urination; sandwich given along with cranberry juice.

## 2014-08-28 NOTE — ED Notes (Signed)
Patient transported to X-ray 

## 2014-08-28 NOTE — ED Provider Notes (Signed)
CSN: [id number]     Arrival date & time 08/28/14  1227 History   First MD Initiated Contact with Patient 08/28/14 1228     Chief Complaint  Patient presents with  . Shortness of Breath   HPI Patient presents to the emergency room with complaints of shortness of breath. Patient woke up this morning was getting ready to fix himself some breakfast. He started becoming acutely short of breath. He had not been coughing recently. He was not having any chest pain. He felt like he was congested and was wheezing in his lungs. He decided to go to the urgent care to be evaluated. At the urgent care he was given albuterol and Atrovent as well as 40 mg of Lasix. The patient is feeling better after that treatment. He was sent from the urgent care to the emergency room for further evaluation. Past Medical History  Diagnosis Date  . Anemia   . Cataracts, bilateral   . CHF (congestive heart failure)     ischemic CM EF 15-20% s/p AICD 05/24/04  . CAD (coronary artery disease)     s/p AMI, s/p PTCA & stent of cx 12/04 restent 5/05  . Diverticulosis of colon   . Hyperlipidemia   . PVD (peripheral vascular disease)     s/p L carotid PTCA/stent 2004  . Transient ischemic attack   . Erectile dysfunction   . Hyperkalemia 08/2008    K=5.7   . BBB (bundle branch block)     right  . HTN (hypertension)   . Elevated PSA   . Adenomatous colon polyp 02/14/2012  . ICD (implantable cardiac defibrillator) in place 12-25-2012    MDT CRTD upgrade by Dr Lovena Le  . Pacemaker   . Type II diabetes mellitus   . Myocardial infarction 1990  . Arthritis     "right hip" (12/25/2012)   Past Surgical History  Procedure Laterality Date  . Cardiac defibrillator placement  05/24/2004    Implantation of a MDT single-chamber defibrillator  . Carotid stent  09/11/2003    Percutaneous transluminal angioplasty and stent placement of the left internal carotid artery.  . Biv icd upgrade  12/25/2012    MDT CRTD upgrade by Dr Lovena Le for  ischemic cardiomyopathy and worsening conduction system disease  . Cataract extraction w/ intraocular lens  implant, bilateral Bilateral ~ 2010  . Cardiac catheterization  06/2003,  01/2004  . Coronary angioplasty with stent placement  1990    "2" (12/25/2012)   Family History  Problem Relation Age of Onset  . Diabetes Mother   . Diabetes Brother    History  Substance Use Topics  . Smoking status: Former Smoker    Types: Cigarettes    Quit date: 12/27/1967  . Smokeless tobacco: Never Used  . Alcohol Use: No     Comment: 12/25/2012 "quit all alcohol 60 yr ago"    Review of Systems  All other systems reviewed and are negative.     Allergies  Review of patient's allergies indicates no known allergies.  Home Medications   Prior to Admission medications   Medication Sig Start Date End Date Taking? Authorizing Provider  albuterol (PROVENTIL HFA;VENTOLIN HFA) 108 (90 BASE) MCG/ACT inhaler Inhale 1-2 puffs into the lungs every 6 (six) hours as needed for wheezing or shortness of breath. 06/12/14   Bjorn Pippin, PA-C  aspirin 81 MG tablet Take 81 mg by mouth daily.     Historical Provider, MD  carvedilol (COREG) 25 MG tablet Take 1  tablet (25 mg total) by mouth 2 (two) times daily with a meal. 07/14/14   Axel Filler, MD  furosemide (LASIX) 20 MG tablet Take 1 tablet (20 mg total) by mouth daily. 07/29/14   Axel Filler, MD  Insulin Glargine (LANTUS SOLOSTAR) 100 UNIT/ML Solostar Pen Inject 44 Units into the skin at bedtime. 12/18/13   Dorian Heckle, MD  Insulin Pen Needle (B-D ULTRAFINE III SHORT PEN) 31G X 8 MM MISC Use as directed for once daily insulin injection. 03/19/14   Axel Filler, MD  lisinopril (PRINIVIL,ZESTRIL) 2.5 MG tablet Take 1 tablet (2.5 mg total) by mouth daily. 08/05/14   Cresenciano Genre, MD  pantoprazole (PROTONIX) 40 MG tablet Take 1 tablet (40 mg total) by mouth daily. 05/07/14   Axel Filler, MD  simvastatin (ZOCOR) 40 MG tablet Take 1 tablet  (40 mg total) by mouth daily. 05/07/14   Axel Filler, MD   BP 163/99 mmHg  Pulse 53  Temp(Src) 97.1 F (36.2 C) (Oral)  Resp 13  SpO2 94% Physical Exam  Constitutional: No distress.  HENT:  Head: Normocephalic and atraumatic.  Right Ear: External ear normal.  Left Ear: External ear normal.  Eyes: Conjunctivae are normal. Right eye exhibits no discharge. Left eye exhibits no discharge. No scleral icterus.  Neck: Neck supple. No tracheal deviation present.  Cardiovascular: Normal rate, regular rhythm and intact distal pulses.   Pulmonary/Chest: Effort normal. No stridor. No respiratory distress. He has wheezes. He has no rales.  At the bases bilaterally  Abdominal: Soft. Bowel sounds are normal. He exhibits no distension. There is no tenderness. There is no rebound and no guarding.  Musculoskeletal: He exhibits edema. He exhibits no tenderness.  Chronic venous stasis changes in his lower extremity as well as bilateral pitting edema to the knee  Neurological: He is alert. He has normal strength. No cranial nerve deficit (no facial droop, extraocular movements intact, no slurred speech) or sensory deficit. He exhibits normal muscle tone. He displays no seizure activity. Coordination normal.  Skin: Skin is warm and dry. No rash noted. He is not diaphoretic.  Psychiatric: He has a normal mood and affect.  Nursing note and vitals reviewed.   ED Course  Procedures (including critical care time) Labs Review Labs Reviewed  BASIC METABOLIC PANEL - Abnormal; Notable for the following:    Glucose, Bld 46 (*)    GFR calc non Af Amer 49 (*)    GFR calc Af Amer 57 (*)    All other components within normal limits  PRO B NATRIURETIC PEPTIDE - Abnormal; Notable for the following:    Pro B Natriuretic peptide (BNP) 1170.0 (*)    All other components within normal limits  CBC  TROPONIN I    Imaging Review Dg Chest 2 View  08/28/2014   CLINICAL DATA:  Shortness of breath beginning this  morning.  EXAM: CHEST  2 VIEW  COMPARISON:  PA and lateral chest 07/29/2014 and 10/06/2012.  FINDINGS: Again seen is marked cardiomegaly. Left basilar atelectasis is noted. The right lung is clear. No pneumothorax or pleural effusion  IMPRESSION: No acute disease.  Marked cardiomegaly and left basilar atelectasis.   Electronically Signed   By: Inge Rise M.D.   On: 08/28/2014 14:21     EKG Interpretation None     EKG Normal sinus rhythm Right bundle branch block Normal ST-T waves No prior EKG for comparison MDM   Final diagnoses:  Acute on chronic congestive  heart failure, unspecified congestive heart failure type    Pt noticed some improvement with diuretics. Has history of CHF and non ischemic cardiomyopathy.  Pt is feeling better but still not back to baseline.  Will consult with his doctors for admission for overnight observation, continue diruesis, cardiac rule out.    Dorie Rank, MD 08/28/14 [phone]

## 2014-08-28 NOTE — ED Notes (Signed)
PT  VOIDED   300  ML  BREATHING  BETTER

## 2014-08-28 NOTE — ED Notes (Signed)
Pt in from [facility] via [facility], per EMS report pt rcvd 5 mg Albuterol & 0.5 mg Atrovent & 40 mg IV Lasix at UC, pt c/o SOB, pt 95% on RA upon arrival to ED, pt denies CP, N/V/D, A&O x4, follows commands, speaks in complete

## 2014-08-28 NOTE — H&P (Signed)
Date: 08/28/2014               Patient Name:  [name] MRN: [id number]  DOB: 10-02-1930 Age / Sex: 80 y.o., male   PCP: Axel Filler, MD         Medical Service: Internal Medicine Teaching Service         Attending Physician: Dr. Dorie Rank, MD    First Contact: Dr. Genene Churn Pager: [phone]  Second Contact: Dr. Gordy Levan Pager: [phone]       After Hours (After 5p/  First Contact Pager: [phone]  weekends / holidays): Second Contact Pager: [phone]   Chief Complaint: SOB  History of Present Illness:   80 yo male with CHF last echo 30-40% 2006, (EF 10-15% before)ischemic s/p AICD, DM II, HTN, CAD s/p stent, HLD, here with SOB. He has SOB at baseline with ambulation but today he was preaparing breakfast and felt SOB. He noticed some wheezing and chest congestion, denies hx of ashthma or copd. No recent sick contacts or URI symptoms. No fever/chills, cough. Went to urgent care to get checked where patient said he got some nebulizer and lasix and he felt better in terms of SOB. He sees Dr. Lovena Le. Has pacemaker and AICD. Denies any chest pain, palpitations. Legs swelling at baseline. His last clinic visit weight 182. Today 188.   He was recently seen at clinic where he had leg swelling. Was told to start taking lasix 20 but patient wasn't taking it. He was also not taking lisinopril 2.5mg  which was started.    Denies any prolonged travel, recent surgery, recent immobilization. Has been taking other meds including coreg, lantus 44 units nightly, asa, protonix. Has been watching salt per patient but did have dietary indiscretion during thanksgiving.   Doesn't smoke, drink, or use drugs. Retired Animator. Has chronic back pain, wears brace for it.   Meds: No current facility-administered medications for this encounter.   Current Outpatient Prescriptions  Medication Sig Dispense Refill  . albuterol (PROVENTIL HFA;VENTOLIN HFA) 108 (90 BASE) MCG/ACT inhaler Inhale 1-2 puffs into the  lungs every 6 (six) hours as needed for wheezing or shortness of breath. 1 Inhaler 0  . aspirin 81 MG tablet Take 81 mg by mouth daily.     . carvedilol (COREG) 25 MG tablet Take 1 tablet (25 mg total) by mouth 2 (two) times daily with a meal. 60 tablet 2  . Insulin Glargine (LANTUS SOLOSTAR) 100 UNIT/ML Solostar Pen Inject 44 Units into the skin at bedtime. 15 mL 6  . Insulin Pen Needle (B-D ULTRAFINE III SHORT PEN) 31G X 8 MM MISC Use as directed for once daily insulin injection. 100 each 3  . pantoprazole (PROTONIX) 40 MG tablet Take 1 tablet (40 mg total) by mouth daily. 30 tablet 3  . simvastatin (ZOCOR) 40 MG tablet Take 1 tablet (40 mg total) by mouth daily. 30 tablet 3  . furosemide (LASIX) 20 MG tablet Take 1 tablet (20 mg total) by mouth daily. (Patient not taking: Reported on 08/28/2014) 30 tablet 1  . lisinopril (PRINIVIL,ZESTRIL) 2.5 MG tablet Take 1 tablet (2.5 mg total) by mouth daily. (Patient not taking: Reported on 08/28/2014) 30 tablet 1    Allergies: Allergies as of 08/28/2014  . (No Known Allergies)   Past Medical History  Diagnosis Date  . Anemia   . Cataracts, bilateral   . CHF (congestive heart failure)     ischemic CM EF 15-20% s/p AICD 05/24/04  .  CAD (coronary artery disease)     s/p AMI, s/p PTCA & stent of cx 12/04 restent 5/05  . Diverticulosis of colon   . Hyperlipidemia   . PVD (peripheral vascular disease)     s/p L carotid PTCA/stent 2004  . Transient ischemic attack   . Erectile dysfunction   . Hyperkalemia 08/2008    K=5.7   . BBB (bundle branch block)     right  . HTN (hypertension)   . Elevated PSA   . Adenomatous colon polyp 02/14/2012  . ICD (implantable cardiac defibrillator) in place 12-25-2012    MDT CRTD upgrade by Dr Lovena Le  . Pacemaker   . Type II diabetes mellitus   . Myocardial infarction 1990  . Arthritis     "right hip" (12/25/2012)   Past Surgical History  Procedure Laterality Date  . Cardiac defibrillator placement  05/24/2004     Implantation of a MDT single-chamber defibrillator  . Carotid stent  09/11/2003    Percutaneous transluminal angioplasty and stent placement of the left internal carotid artery.  . Biv icd upgrade  12/25/2012    MDT CRTD upgrade by Dr Lovena Le for ischemic cardiomyopathy and worsening conduction system disease  . Cataract extraction w/ intraocular lens  implant, bilateral Bilateral ~ 2010  . Cardiac catheterization  06/2003,  01/2004  . Coronary angioplasty with stent placement  1990    "2" (12/25/2012)   Family History  Problem Relation Age of Onset  . Diabetes Mother   . Diabetes Brother    History   Social History  . Marital Status: Widowed    Spouse Name: N/A    Number of Children: N/A  . Years of Education: 12   Occupational History  . retired    Social History Main Topics  . Smoking status: Former Smoker    Types: Cigarettes    Quit date: 12/27/1967  . Smokeless tobacco: Never Used  . Alcohol Use: No     Comment: 12/25/2012 "quit all alcohol 60 yr ago"  . Drug Use: No  . Sexual Activity: Yes   Other Topics Concern  . Not on file   Social History Narrative   Single, 2 adult children, daughter in [place], son in [place]: Review of Systems  Constitutional: Negative for fever, chills, weight loss, malaise/fatigue and diaphoresis.  HENT: Negative for congestion, ear discharge, hearing loss, nosebleeds and sore throat.   Eyes: Negative for blurred vision, double vision, photophobia, pain, discharge and redness.  Respiratory: Positive for shortness of breath and wheezing. Negative for cough, hemoptysis, sputum production and stridor.   Cardiovascular: Positive for leg swelling. Negative for chest pain, palpitations, orthopnea, claudication and PND.  Gastrointestinal: Negative for heartburn, nausea, vomiting, abdominal pain, diarrhea, constipation, blood in stool and melena.  Genitourinary: Negative for dysuria, urgency, frequency, hematuria and  flank pain.  Musculoskeletal: Negative for myalgias, back pain, joint pain, falls and neck pain.  Skin: Negative.   Neurological: Negative for dizziness, tingling, tremors, sensory change, speech change, focal weakness, seizures, loss of consciousness, weakness and headaches.  Endo/Heme/Allergies: Negative.   Psychiatric/Behavioral: Negative.      Physical Exam: Blood pressure 185/83, pulse 62, temperature 97.1 F (36.2 C), temperature source Oral, resp. rate 19, weight 188 lb 8 oz (85.503 kg), SpO2 96 %. Physical Exam  Constitutional: He is oriented to person, place, and time. He appears well-developed and well-nourished. No distress.  HENT:  Head: Normocephalic and atraumatic.  Right Ear: External ear  normal.  Left Ear: External ear normal.  Nose: Nose normal.  Mouth/Throat: Oropharynx is clear and moist. No oropharyngeal exudate.  Eyes: Conjunctivae and EOM are normal. Pupils are equal, round, and reactive to light. Right eye exhibits no discharge. Left eye exhibits no discharge. No scleral icterus.  Neck: Normal range of motion. Neck supple. No JVD present. No tracheal deviation present. No thyromegaly present.  Cardiovascular: Normal rate, regular rhythm, normal heart sounds and intact distal pulses.  Exam reveals no gallop and no friction rub.   No murmur heard. Respiratory: Effort normal and breath sounds normal. No respiratory distress. He has no wheezes. He has no rales. He exhibits no tenderness.  GI: Soft. Bowel sounds are normal. He exhibits no distension and no mass. There is no tenderness. There is no rebound and no guarding.  Wrapped around with back brace.   Musculoskeletal: Normal range of motion. He exhibits no tenderness.  Trace edema on BLE's.   Has some chronic skin lesions on his shin. Sees dermatology outpatient. No changes recently.  Neurological: He is alert and oriented to person, place, and time. He displays normal reflexes. No cranial nerve deficit. He  exhibits normal muscle tone. Coordination normal.  Skin: He is not diaphoretic.  Psychiatric: He has a normal mood and affect.     Lab results: Basic Metabolic Panel:  Recent Labs  08/28/14 1141 08/28/14 1345  NA 141 141  K 4.4 4.2  CL 102 102  CO2  --  29  GLUCOSE 73 46*  BUN 20 19  CREATININE 1.40* 1.31  CALCIUM  --  9.8   Liver Function Tests: No results for input(s): AST, ALT, ALKPHOS, BILITOT, PROT, ALBUMIN in the last 72 hours. No results for input(s): LIPASE, AMYLASE in the last 72 hours. No results for input(s): AMMONIA in the last 72 hours. CBC:  Recent Labs  08/28/14 1141 08/28/14 1345  WBC  --  8.5  HGB 17.0 14.9  HCT 50.0 46.3  MCV  --  93.2  PLT  --  169   Cardiac Enzymes:  Recent Labs  08/28/14 1345  TROPONINI <0.30   BNP:  Recent Labs  08/28/14 1345  PROBNP 1170.0*   D-Dimer: No results for input(s): DDIMER in the last 72 hours. CBG:  Recent Labs  08/28/14 1449  GLUCAP 74   Hemoglobin A1C: No results for input(s): HGBA1C in the last 72 hours. Fasting Lipid Panel: No results for input(s): CHOL, HDL, LDLCALC, TRIG, CHOLHDL, LDLDIRECT in the last 72 hours. Thyroid Function Tests: No results for input(s): TSH, T4TOTAL, FREET4, T3FREE, THYROIDAB in the last 72 hours. Anemia Panel: No results for input(s): VITAMINB12, FOLATE, FERRITIN, TIBC, IRON, RETICCTPCT in the last 72 hours. Coagulation: No results for input(s): LABPROT, INR in the last 72 hours. Urine Drug Screen: Drugs of Abuse  No results found for: LABOPIA, COCAINSCRNUR, LABBENZ, AMPHETMU, THCU, LABBARB  Alcohol Level: No results for input(s): ETH in the last 72 hours. Urinalysis: No results for input(s): COLORURINE, LABSPEC, PHURINE, GLUCOSEU, HGBUR, BILIRUBINUR, KETONESUR, PROTEINUR, UROBILINOGEN, NITRITE, LEUKOCYTESUR in the last 72 hours.  Invalid input(s): APPERANCEUR Misc. Labs:   Imaging results:  Dg Chest 2 View  08/28/2014   CLINICAL DATA:  Shortness of  breath beginning this morning.  EXAM: CHEST  2 VIEW  COMPARISON:  PA and lateral chest 07/29/2014 and 10/06/2012.  FINDINGS: Again seen is marked cardiomegaly. Left basilar atelectasis is noted. The right lung is clear. No pneumothorax or pleural effusion  IMPRESSION: No acute disease.  Marked cardiomegaly and left basilar atelectasis.   Electronically Signed   By: Inge Rise M.D.   On: 08/28/2014 14:21    Other results: EKG:  Assessment & Plan by Problem: Active Problems:   * No active hospital problems. [age] yo male with CHF, CAD, HTN, DM here with SOB.  SOB - likely acute on chronic CHF exacerbation (ischemic) ECHO 2006 EF 30-40%, hx of 10-15%, s/p AIC  BNP 1170 (before 1036), weight is up. Has dietary indiscretion from thanksgiving likely causing the exacerbation. Does have some wheezing but this could be from CHF too. Doesn't have asthma or copd history but did respond to albuterol and diuretic so hard to tell which one helped.  Low wells (zero), low mod geneva (1). MI less likely given no chest pain, EKG. - trend trops - will repeat ECHO.  - will do lasix again tomorrow. Got 40 mg IV lasix today. Will need to be on lasix.  - strict i/o, daily weights, o2 PRN to keep >92% o2. - repeat CXR tomorrow.  Diabetes - last hgba1c 07/29/14 7.8 - on lantus 44 daily. cbg showed glucose 46.  - hold lantus since low cbg. SSI for now.  HTN - cont coreg, also will start lisinopril that was supposed to be started.  - lisinopril 2.5 mg + coreg 25 mg BID.   [name] - crt at baseline ~1.3 - monitor. CAD - no active chest pain now. Cont asa 81 mg daily. HLD - zocor.  Dispo: Disposition is deferred at this time, awaiting improvement of current medical problems. Anticipated discharge in approximately 1-2 day(s).   The patient does have a current PCP Axel Filler, MD) and does need an [facility] hospital follow-up appointment after discharge.  The patient does have transportation limitations that  hinder transportation to clinic appointments.  Signed: Dellia Nims, MD 08/28/2014, 6:28 PM

## 2014-08-28 NOTE — ED Notes (Signed)
Dr.Knapp at bedside  

## 2014-08-29 DIAGNOSIS — I509 Heart failure, unspecified: Secondary | ICD-10-CM | POA: Diagnosis not present

## 2014-08-29 LAB — GLUCOSE, CAPILLARY
GLUCOSE-CAPILLARY: 128 mg/dL — AB (ref 70–99)
Glucose-Capillary: 46 mg/dL — ABNORMAL LOW (ref 70–99)
Glucose-Capillary: 35 mg/dL — CL (ref 70–99)
Glucose-Capillary: 80 mg/dL (ref 70–99)
Glucose-Capillary: 176 mg/dL — ABNORMAL HIGH (ref 70–99)
Glucose-Capillary: 203 mg/dL — ABNORMAL HIGH (ref 70–99)
Glucose-Capillary: >600 mg/dL (ref 70–99)
Glucose-Capillary: 343 mg/dL — ABNORMAL HIGH (ref 70–99)

## 2014-08-29 LAB — BASIC METABOLIC PANEL
ANION GAP: 13 (ref 5–15)
BUN: 21 mg/dL (ref 6–23)
CHLORIDE: 100 meq/L (ref 96–112)
CO2: 27 meq/L (ref 19–32)
CREATININE: 1.37 mg/dL — AB (ref 0.50–1.35)
Calcium: 9.5 mg/dL (ref 8.4–10.5)
GFR calc Af Amer: 54 mL/min — ABNORMAL LOW (ref >90)
GFR calc non Af Amer: 47 mL/min — ABNORMAL LOW (ref >90)
Glucose, Bld: 175 mg/dL — ABNORMAL HIGH (ref 70–99)
Potassium: 3.7 mEq/L (ref 3.7–5.3)
Sodium: 140 mEq/L (ref 137–147)

## 2014-08-29 LAB — TROPONIN I
Troponin I: <0.3 ng/mL (ref <0.30)
Troponin I: <0.3 ng/mL (ref <0.30)

## 2014-08-29 MED ORDER — INSULIN ASPART 100 UNIT/ML ~~LOC~~ SOLN
0.0000 [IU] | Freq: Three times a day (TID) | SUBCUTANEOUS | Status: DC
  Administered 2014-08-29: 9 [IU] via SUBCUTANEOUS

## 2014-08-29 MED ORDER — ALBUTEROL SULFATE HFA 108 (90 BASE) MCG/ACT IN AERS: 1.0000 | INHALATION_SPRAY | RESPIRATORY_TRACT | Status: DC | PRN

## 2014-08-29 MED ORDER — LISINOPRIL 2.5 MG PO TABS: 2.5000 mg | ORAL_TABLET | Freq: Every day | ORAL | Status: DC

## 2014-08-29 MED ORDER — GLUCOSE 40 % PO GEL
1.0000 | Freq: Once | ORAL | Status: AC
  Administered 2014-08-29: 37.5 g via ORAL

## 2014-08-29 MED ORDER — POTASSIUM CHLORIDE CRYS ER 20 MEQ PO TBCR
20.0000 meq | EXTENDED_RELEASE_TABLET | Freq: Every day | ORAL | Status: DC
  Administered 2014-08-29: 20 meq via ORAL

## 2014-08-29 MED ORDER — INSULIN GLARGINE 100 UNIT/ML ~~LOC~~ SOLN
20.0000 [IU] | Freq: Every day | SUBCUTANEOUS | Status: DC
  Filled 2014-08-29: qty 0.2

## 2014-08-29 MED ORDER — PANTOPRAZOLE SODIUM 40 MG PO TBEC
DELAYED_RELEASE_TABLET | ORAL | Status: AC
  Administered 2014-08-29: 11:00:00
  Filled 2014-08-29: qty 1

## 2014-08-29 MED ORDER — FUROSEMIDE 20 MG PO TABS
20.0000 mg | ORAL_TABLET | Freq: Every day | ORAL | Status: DC
Start: 2014-08-29 — End: 2014-11-16

## 2014-08-29 MED ORDER — POTASSIUM CHLORIDE CRYS ER 20 MEQ PO TBCR
EXTENDED_RELEASE_TABLET | ORAL | Status: AC
  Administered 2014-08-29: 20 meq
  Filled 2014-08-29: qty 1

## 2014-08-29 MED ORDER — FUROSEMIDE 20 MG PO TABS
20.0000 mg | ORAL_TABLET | Freq: Every day | ORAL | Status: DC
  Administered 2014-08-29: 20 mg via ORAL
  Filled 2014-08-29: qty 1

## 2014-08-29 MED ORDER — INSULIN GLARGINE 100 UNIT/ML ~~LOC~~ SOLN: 10.0000 [IU] | Freq: Every day | SUBCUTANEOUS | Status: DC

## 2014-08-29 NOTE — Progress Notes (Signed)
Patient was getting weighed and fell back on to the bed. His blood sugar was 46. Juice and graham crackers were given and sugar rechecked and it was 35, patient was asymptomatic. Glucose gel then given to patient and blood sugar went up to 80. Will continue to monitor patient.

## 2014-08-29 NOTE — Plan of Care (Signed)
Problem: Phase I Progression Outcomes Goal: Voiding-avoid urinary catheter unless indicated Outcome: Progressing External foley catheter

## 2014-08-29 NOTE — Progress Notes (Signed)
  Date: 08/29/2014  Patient name: [name]  Medical record number: [id number]  Date of birth: August 08, 1931   I have seen and evaluated [name] and discussed their care with the Residency Team. [name] was admitted for dyspnea which occurred suddenly while he was making breakfast on the day of admit. He did not eat and called brother and went to [facility] and then ED.   PE R/O with Well's 0 and geniva score of 1. AMI R/O Trop I x3 & EKG. PNA R/O with CXR. Pericarditis R/O EKG.   On exam today, vitals are stable HRRR no MRG Lungs clear with great air flow Ext no edema Long toenails  Assessment and Plan: I have seen and evaluated the patient as outlined above. I agree with the formulated Assessment and Plan as detailed in the residents' admission note, with the following changes:   1. Acute on chronic HF - CXR doesn't confirm this but pro BNP is elevated 1170 and he had some edema LE along with recent dietary indiscretion. Also, he had been started on lasix as outpt but never started it. He responded well to one dose IV lasix of 40 and we will restart the home lasix he was to be on. He will need close F/U to check BMP and vol status.   2. Known HF - may get ECHO as outpt as results will not change current tx plan. On BB and we will add low dose ACEI and F/U as outpt.   3. Hypoglycemia in insulin dep DM - likely from not eating breakfast. He denies lows at home but he also denied hypoglycemic sxs when low this admit. Will need to see PCP and bring in CBG log for review.  Robert Crews, MD 12/5/20154:32 PM

## 2014-08-29 NOTE — Discharge Instructions (Signed)
You were here with volume overload likely from your heart failure. Please take lisinopril and lasix going home. Follow up at our clinic (they will call you for appointment) next week.   Weigh yourself daily. Watch salt and water intake.

## 2014-08-29 NOTE — Discharge Summary (Signed)
Name: [name] MRN: [id number] DOB: 12/03/1930 80 y.o. PCP: Axel Filler, MD  Date of Admission: 08/28/2014 12:27 PM Date of Discharge: 08/29/2014 Attending Physician: Bartholomew Crews, MD  Discharge Diagnosis:  Active Problems:   Acute on chronic congestive heart failure  Discharge Medications:   Medication List    TAKE these medications        albuterol 108 (90 BASE) MCG/ACT inhaler  Commonly known as:  PROVENTIL HFA;VENTOLIN HFA  Inhale 1-2 puffs into the lungs every 4 (four) hours as needed for wheezing or shortness of breath.     aspirin 81 MG tablet  Take 81 mg by mouth daily.     carvedilol 25 MG tablet  Commonly known as:  COREG  Take 1 tablet (25 mg total) by mouth 2 (two) times daily with a meal.     furosemide 20 MG tablet  Commonly known as:  LASIX  Take 1 tablet (20 mg total) by mouth daily.     Insulin Glargine 100 UNIT/ML Solostar Pen  Commonly known as:  LANTUS SOLOSTAR  Inject 44 Units into the skin at bedtime.     Insulin Pen Needle 31G X 8 MM Misc  Commonly known as:  B-D ULTRAFINE III SHORT PEN  Use as directed for once daily insulin injection.     lisinopril 2.5 MG tablet  Commonly known as:  PRINIVIL,ZESTRIL  Take 1 tablet (2.5 mg total) by mouth daily.     pantoprazole 40 MG tablet  Commonly known as:  PROTONIX  Take 1 tablet (40 mg total) by mouth daily.     simvastatin 40 MG tablet  Commonly known as:  ZOCOR  Take 1 tablet (40 mg total) by mouth daily.        Disposition and follow-up:   [name] was discharged from [facility] in Stable condition.  At the hospital follow up visit please address:  1.  Came in with acute on chronic CHF, SOB improved with IV lasix. Started on PO lasix 20mg  daily which he was supposed to take anyway but didn't know about it. Also started lisinopril 2.5 mg for HTN and CHF. Please make sure his kidney function is ok with BMP at follow up. K+ was borderline low  so we also repleted in hospital. Please add PO K+ if needed.   Ordered ECHO here but didn't get done for some reason. Can get outpatient as planned before. It will not change management so we didn't keep him here for that.  2.  Labs / imaging needed at time of follow-up: BMP to make sure crt ok with lasix and lisinopril.  3.  Pending labs/ test needing follow-up:   Follow-up Appointments:     Follow-up Information    Follow up with Axel Filler, MD.   Specialty:  Internal Medicine   Why:  clinic will call you for appt.   Contact information:   [place] [address] [phone]       Discharge Instructions:   Consultations:    Procedures Performed:  Dg Chest 2 View  08/28/2014   CLINICAL DATA:  Shortness of breath beginning this morning.  EXAM: CHEST  2 VIEW  COMPARISON:  PA and lateral chest 07/29/2014 and 10/06/2012.  FINDINGS: Again seen is marked cardiomegaly. Left basilar atelectasis is noted. The right lung is clear. No pneumothorax or pleural effusion  IMPRESSION: No acute disease.  Marked cardiomegaly and left basilar atelectasis.   Electronically  Signed   By: Inge Rise M.D.   On: 08/28/2014 14:21   Admission HPI:   80 yo male with CHF last echo 30-40% 2006, (EF 10-15% before)ischemic s/p AICD, DM II, HTN, CAD s/p stent, HLD, here with SOB. He has SOB at baseline with ambulation but today he was preaparing breakfast and felt SOB. He noticed some wheezing and chest congestion, denies hx of ashthma or copd. No recent sick contacts or URI symptoms. No fever/chills, cough. Went to urgent care to get checked where patient said he got some nebulizer and lasix and he felt better in terms of SOB. He sees Dr. Lovena Le. Has pacemaker and AICD. Denies any chest pain, palpitations. Legs swelling at baseline. His last clinic visit weight 182. Today 188.   He was recently seen at clinic where he had leg swelling. Was told to start taking lasix 20 but  patient wasn't taking it. He was also not taking lisinopril 2.5mg  which was started.   Denies any prolonged travel, recent surgery, recent immobilization. Has been taking other meds including coreg, lantus 44 units nightly, asa, protonix. Has been watching salt per patient but did have dietary indiscretion during thanksgiving.   Doesn't smoke, drink, or use drugs. Retired Animator. Has chronic back pain, wears brace for it.    Hospital Course by problem list: Active Problems:   Acute on chronic congestive heart failure   80 yo male with chf, cad, htn, dm II here with CHF exacerbation.  Acute on chronic CHF last EF 2006 - EF 30-40% on 2006, s/p aicd because had hx of ef 10-15%. BNP around baseline 1170, had dietary indiscretion during thanksgiving. PE unlikely since zero wells, 1 mod [place]. Does have inhaler at home but no hx of copd or asthma diagnosed. Sob improved with IV diuresis and also duoneb. Weight was up 188 (last clinic visit 182). With diuresis now 175 lb today. crt stable. - was supposed to take lasix 20mg  at home but patient wasn't aware of it. Got IV diuretics here 40 IV lasix and also 40IV lasix at urgent care. Improved symptoms. - will continue home dose 20mg  po lasix daily that he was supposed to take - also start lisinopril 2.5mg  daily - cont coreg, asa. - ECHO ordered but not done yet. Will not keep him for echo as it will not change management. Can be done outpatient.  DM II - last hgba1c 7.8 07/2014. On lantus 44 daily. cbg was low here likely due not poor po intake yesterday.  - lantus 20 u here with ssi but will do lantus 44 at home since he will be eating per usual and he was doing well without problem with hypoglycemia at home.  HTN - cont coreg 25mg  BID. Also start lisinopril 2.5mg  daily as PCP wanted before. BP controlled with this regimen here.  [name] - crt at baseline ~1.3 CAD - no active chest pain now. Cont asa 81 mg daily HLD - zocor  Discharge Vitals:    BP 124/56 mmHg  Pulse 61  Temp(Src) 97.8 F (36.6 C) (Oral)  Resp 18  Ht 5\' 10"  (1.778 m)  Wt 175 lb 9.6 oz (79.652 kg)  BMI 25.20 kg/m2  SpO2 97%  Discharge Labs:  Results for orders placed or performed during the hospital encounter of 08/28/14 (from the past 24 hour(s))  Basic metabolic panel     Status: Abnormal   Collection Time: 08/28/14  1:45 PM  Result Value Ref Range   Sodium 141  137 - 147 mEq/L   Potassium 4.2 3.7 - 5.3 mEq/L   Chloride 102 96 - 112 mEq/L   CO2 29 19 - 32 mEq/L   Glucose, Bld 46 (L) 70 - 99 mg/dL   BUN 19 6 - 23 mg/dL   Creatinine, Ser 1.31 0.50 - 1.35 mg/dL   Calcium 9.8 8.4 - 10.5 mg/dL   GFR calc non Af Amer 49 (L) >90 mL/min   GFR calc Af Amer 57 (L) >90 mL/min   Anion gap 10 5 - 15  CBC     Status: None   Collection Time: 08/28/14  1:45 PM  Result Value Ref Range   WBC 8.5 4.0 - 10.5 K/uL   RBC 4.97 4.22 - 5.81 MIL/uL   Hemoglobin 14.9 13.0 - 17.0 g/dL   HCT 46.3 39.0 - 52.0 %   MCV 93.2 78.0 - 100.0 fL   MCH 30.0 26.0 - 34.0 pg   MCHC 32.2 30.0 - 36.0 g/dL   RDW 14.5 11.5 - 15.5 %   Platelets 169 150 - 400 K/uL  Pro b natriuretic peptide     Status: Abnormal   Collection Time: 08/28/14  1:45 PM  Result Value Ref Range   Pro B Natriuretic peptide (BNP) 1170.0 (H) 0 - 450 pg/mL  Troponin I (MHP)     Status: None   Collection Time: 08/28/14  1:45 PM  Result Value Ref Range   Troponin I <0.30 <0.30 ng/mL  CBG monitoring, ED     Status: None   Collection Time: 08/28/14  2:49 PM  Result Value Ref Range   Glucose-Capillary 74 70 - 99 mg/dL   Comment 1 Documented in Chart    Comment 2 Notify RN   Glucose, capillary     Status: Abnormal   Collection Time: 08/28/14  9:03 PM  Result Value Ref Range   Glucose-Capillary 216 (H) 70 - 99 mg/dL  Troponin I (q 6hr x 3)     Status: None   Collection Time: 08/28/14  9:16 PM  Result Value Ref Range   Troponin I <0.30 <0.30 ng/mL  Glucose, capillary     Status: Abnormal   Collection Time:  08/29/14 12:13 AM  Result Value Ref Range   Glucose-Capillary 128 (H) 70 - 99 mg/dL   Comment 1 Notify RN   Troponin I (q 6hr x 3)     Status: None   Collection Time: 08/29/14  2:43 AM  Result Value Ref Range   Troponin I <0.30 <0.30 ng/mL  Glucose, capillary     Status: Abnormal   Collection Time: 08/29/14  4:17 AM  Result Value Ref Range   Glucose-Capillary 46 (L) 70 - 99 mg/dL  Glucose, capillary     Status: Abnormal   Collection Time: 08/29/14  4:33 AM  Result Value Ref Range   Glucose-Capillary 35 (LL) 70 - 99 mg/dL  Glucose, capillary     Status: None   Collection Time: 08/29/14  4:48 AM  Result Value Ref Range   Glucose-Capillary 80 70 - 99 mg/dL  Glucose, capillary     Status: Abnormal   Collection Time: 08/29/14  6:37 AM  Result Value Ref Range   Glucose-Capillary 176 (H) 70 - 99 mg/dL   Comment 1 Notify RN   Glucose, capillary     Status: Abnormal   Collection Time: 08/29/14  8:04 AM  Result Value Ref Range   Glucose-Capillary 203 (H) 70 - 99 mg/dL   Comment 1 Documented in  Chart    Comment 2 Notify RN   Troponin I (q 6hr x 3)     Status: None   Collection Time: 08/29/14  8:40 AM  Result Value Ref Range   Troponin I <0.30 <0.30 ng/mL  Basic metabolic panel     Status: Abnormal   Collection Time: 08/29/14  8:40 AM  Result Value Ref Range   Sodium 140 137 - 147 mEq/L   Potassium 3.7 3.7 - 5.3 mEq/L   Chloride 100 96 - 112 mEq/L   CO2 27 19 - 32 mEq/L   Glucose, Bld 175 (H) 70 - 99 mg/dL   BUN 21 6 - 23 mg/dL   Creatinine, Ser 1.37 (H) 0.50 - 1.35 mg/dL   Calcium 9.5 8.4 - 10.5 mg/dL   GFR calc non Af Amer 47 (L) >90 mL/min   GFR calc Af Amer 54 (L) >90 mL/min   Anion gap 13 5 - 15  Glucose, capillary     Status: Abnormal   Collection Time: 08/29/14 12:21 PM  Result Value Ref Range   Glucose-Capillary >600 (HH) 70 - 99 mg/dL   Comment 1 Documented in Chart    Comment 2 Notify RN   Glucose, capillary     Status: Abnormal   Collection Time: 08/29/14 12:36  PM  Result Value Ref Range   Glucose-Capillary 343 (H) 70 - 99 mg/dL    Signed: Dellia Nims, MD 08/29/2014, 12:46 PM    Services Ordered on Discharge:  Equipment Ordered on Discharge:

## 2014-08-29 NOTE — Progress Notes (Signed)
Subjective:  Doing well. Sob has resolved. Feels good today. Denies any sob, cp, cough, n/v, fever, chills, headache, numbness, weakness, tingling.  Had low cbg in 40's overnight but improved with intervention/food. Didn't eat breakfast yesterday but did take lantus 44.   Objective: Vital signs in last 24 hours: Filed Vitals:   08/28/14 2016 08/28/14 2239 08/29/14 0248 08/29/14 0421  BP: 180/71   124/56  Pulse: 73  52 61  Temp: 97.8 F (36.6 C)   97.8 F (36.6 C)  TempSrc: Oral   Oral  Resp: 18  18 18   Height: 5\' 10"  (1.778 m)     Weight: 187 lb 4.8 oz (84.959 kg)   175 lb 9.6 oz (79.652 kg)  SpO2: 9% 98%  97%   Weight change:   Intake/Output Summary (Last 24 hours) at 08/29/14 1138 Last data filed at 08/29/14 0938  Gross per 24 hour  Intake    840 ml  Output   4300 ml  Net  -3460 ml   Vitals reviewed. General: resting in bed, NAD HEENT: PERRL, EOMI, no scleral icterus Cardiac: RRR, no rubs, murmurs or gallops Pulm: clear to auscultation bilaterally, no wheezes, rales, or rhonchi Abd: soft, nontender, nondistended, BS present Ext: warm and well perfused, trace pedal edema. Has chronic scan on shins from previous injury. Has dark toenails chronically. Neuro: alert and oriented X3, cranial nerves II-XII grossly intact, strength and sensation to light touch equal in bilateral upper and lower extremities  Lab Results: Basic Metabolic Panel:  Recent Labs Lab 08/28/14 1345 08/29/14 0840  NA 141 140  K 4.2 3.7  CL 102 100  CO2 29 27  GLUCOSE 46* 175*  BUN 19 21  CREATININE 1.31 1.37*  CALCIUM 9.8 9.5   Liver Function Tests: No results for input(s): AST, ALT, ALKPHOS, BILITOT, PROT, ALBUMIN in the last 168 hours. No results for input(s): LIPASE, AMYLASE in the last 168 hours. No results for input(s): AMMONIA in the last 168 hours. CBC:  Recent Labs Lab 08/28/14 1141 08/28/14 1345  WBC  --  8.5  HGB 17.0 14.9  HCT 50.0 46.3  MCV  --  93.2  PLT  --  169     Cardiac Enzymes:  Recent Labs Lab 08/28/14 2116 08/29/14 0243 08/29/14 0840  TROPONINI <0.30 <0.30 <0.30   BNP:  Recent Labs Lab 08/28/14 1345  PROBNP 1170.0*   D-Dimer: No results for input(s): DDIMER in the last 168 hours. CBG:  Recent Labs Lab 08/29/14 0013 08/29/14 0417 08/29/14 0433 08/29/14 0448 08/29/14 0637 08/29/14 0804  GLUCAP 128* 46* 35* 80 176* 203*   Hemoglobin A1C: No results for input(s): HGBA1C in the last 168 hours. Fasting Lipid Panel: No results for input(s): CHOL, HDL, LDLCALC, TRIG, CHOLHDL, LDLDIRECT in the last 168 hours. Thyroid Function Tests: No results for input(s): TSH, T4TOTAL, FREET4, T3FREE, THYROIDAB in the last 168 hours. Coagulation: No results for input(s): LABPROT, INR in the last 168 hours. Anemia Panel: No results for input(s): VITAMINB12, FOLATE, FERRITIN, TIBC, IRON, RETICCTPCT in the last 168 hours. Urine Drug Screen: Drugs of Abuse  No results found for: LABOPIA, COCAINSCRNUR, LABBENZ, AMPHETMU, THCU, LABBARB  Alcohol Level: No results for input(s): ETH in the last 168 hours. Urinalysis: No results for input(s): COLORURINE, LABSPEC, PHURINE, GLUCOSEU, HGBUR, BILIRUBINUR, KETONESUR, PROTEINUR, UROBILINOGEN, NITRITE, LEUKOCYTESUR in the last 168 hours.  Invalid input(s): APPERANCEUR Misc. Labs:  Micro Results: No results found for this or any previous visit (from the past 240 hour(s)).  Studies/Results: Dg Chest 2 View  08/28/2014   CLINICAL DATA:  Shortness of breath beginning this morning.  EXAM: CHEST  2 VIEW  COMPARISON:  PA and lateral chest 07/29/2014 and 10/06/2012.  FINDINGS: Again seen is marked cardiomegaly. Left basilar atelectasis is noted. The right lung is clear. No pneumothorax or pleural effusion  IMPRESSION: No acute disease.  Marked cardiomegaly and left basilar atelectasis.   Electronically Signed   By: Inge Rise M.D.   On: 08/28/2014 14:21   Medications: I have reviewed the patient's  current medications. Scheduled Meds: . aspirin EC  81 mg Oral Daily  . carvedilol  25 mg Oral BID WC  . furosemide  20 mg Oral Daily  . heparin  5,000 Units Subcutaneous 3 times per day  . insulin aspart  0-9 Units Subcutaneous TID WC  . insulin glargine  20 Units Subcutaneous QHS  . ipratropium-albuterol  3 mL Nebulization Q6H  . lisinopril  2.5 mg Oral Daily  . pantoprazole  40 mg Oral Daily  . potassium chloride  20 mEq Oral Daily  . simvastatin  40 mg Oral q1800   Continuous Infusions:  PRN Meds:.acetaminophen, albuterol Assessment/Plan: Active Problems:   Acute on chronic congestive heart failure  80 yo male with chf, cad, htn, dm II here with CHF exacerbation.  Acute on chronic CHF last EF 2006 - EF 30-40% on 2006, s/p aicd because had hx of ef 10-15%. BNP around baseline 1170, had dietary indiscretion during thanksgiving. PE unlikely since zero wells, 1 mod [place]. Does have inhaler at home but no hx of copd or asthma diagnosed. Sob improved with IV diuresis and also duoneb. Weight was up 188 (last clinic visit 182). With diuresis now 175 lb today. crt stable. - was supposed to take lasix 20mg  at home but patient wasn't aware of it. Got IV diuretics here 40 IV lasix and also 40IV lasix at urgent care. Improved symptoms. - will continue home dose 20mg  po lasix daily that he was supposed to take - also start lisinopril 2.5mg  daily - cont coreg, asa. - ECHO ordered but not done yet. Will not keep him for echo as it will not change management. Can be done outpatient.  DM II - last hgba1c 7.8 07/2014. On lantus 44 daily. cbg was low here likely due not poor po intake yesterday.  - lantus 20 u here with ssi but will do lantus 44 at home since he will be eating per usual and he was doing well without problem with hypoglycemia at home.  HTN - cont coreg 25mg  BID. Also start lisinopril 2.5mg  daily as PCP wanted before. BP controlled with this regimen here.  [name] - crt at baseline  ~1.3 CAD - no active chest pain now. Cont asa 81 mg daily HLD - zocor    Dispo: Disposition is deferred at this time, awaiting improvement of current medical problems.  Anticipated discharge in approximately today(s).   The patient does have a current PCP Axel Filler, MD) and does need an [facility] hospital follow-up appointment after discharge.  The patient does have transportation limitations that hinder transportation to clinic appointments.  .Services Needed at time of discharge: Y = Yes, Blank = No PT:   OT:   RN:   Equipment:   Other:     LOS: 1 day   Dellia Nims, MD 08/29/2014, 11:38 AM

## 2014-08-29 NOTE — Plan of Care (Signed)
Problem: Phase II Progression Outcomes Goal: Tolerating diet Outcome: Progressing Tolerating diet without incident.

## 2014-08-29 NOTE — Progress Notes (Signed)
Heart Failure education reinforced about daily weights and meds.  Verbalized understanding.  To follow up as instructed.

## 2014-08-31 NOTE — ED Provider Notes (Signed)
CSN: [id number]     Arrival date & time 08/28/14  1053 History   None    Chief Complaint  Patient presents with  . Wheezing   (Consider location/radiation/quality/duration/timing/severity/associated sxs/prior Treatment) Patient is a 80 y.o. male presenting with wheezing. The history is provided by the patient.  Wheezing Severity:  Moderate Onset quality:  Gradual Duration:  2 days Progression:  Worsening Chronicity:  New Relieved by:  None tried Associated symptoms: foot swelling, orthopnea and shortness of breath   Associated symptoms: no chest pain, no chest tightness and no fever   Risk factors: prior hospitalizations     Past Medical History  Diagnosis Date  . Anemia   . Cataracts, bilateral   . CHF (congestive heart failure)     ischemic CM EF 15-20% s/p AICD 05/24/04  . CAD (coronary artery disease)     s/p AMI, s/p PTCA & stent of cx 12/04 restent 5/05  . Diverticulosis of colon   . Hyperlipidemia   . PVD (peripheral vascular disease)     s/p L carotid PTCA/stent 2004  . Transient ischemic attack   . Erectile dysfunction   . Hyperkalemia 08/2008    K=5.7   . BBB (bundle branch block)     right  . HTN (hypertension)   . Elevated PSA   . Adenomatous colon polyp 02/14/2012  . ICD (implantable cardiac defibrillator) in place 12-25-2012    MDT CRTD upgrade by Dr Lovena Le  . Pacemaker   . Type II diabetes mellitus   . Myocardial infarction 1990  . Arthritis     "right hip" (12/25/2012)   Past Surgical History  Procedure Laterality Date  . Cardiac defibrillator placement  05/24/2004    Implantation of a MDT single-chamber defibrillator  . Carotid stent  09/11/2003    Percutaneous transluminal angioplasty and stent placement of the left internal carotid artery.  . Biv icd upgrade  12/25/2012    MDT CRTD upgrade by Dr Lovena Le for ischemic cardiomyopathy and worsening conduction system disease  . Cataract extraction w/ intraocular lens  implant, bilateral Bilateral ~ 2010   . Cardiac catheterization  06/2003,  01/2004  . Coronary angioplasty with stent placement  1990    "2" (12/25/2012)   Family History  Problem Relation Age of Onset  . Diabetes Mother   . Diabetes Brother    History  Substance Use Topics  . Smoking status: Former Smoker    Types: Cigarettes    Quit date: 12/27/1967  . Smokeless tobacco: Never Used  . Alcohol Use: No     Comment: 12/25/2012 "quit all alcohol 60 yr ago"    Review of Systems  Constitutional: Negative.  Negative for fever.  Respiratory: Positive for shortness of breath and wheezing. Negative for chest tightness.   Cardiovascular: Positive for orthopnea and leg swelling. Negative for chest pain and palpitations.    Allergies  Review of patient's allergies indicates no known allergies.  Home Medications   Prior to Admission medications   Medication Sig Start Date End Date Taking? Authorizing Provider  albuterol (PROVENTIL HFA;VENTOLIN HFA) 108 (90 BASE) MCG/ACT inhaler Inhale 1-2 puffs into the lungs every 4 (four) hours as needed for wheezing or shortness of breath. 08/29/14   Dellia Nims, MD  aspirin 81 MG tablet Take 81 mg by mouth daily.     Historical Provider, MD  carvedilol (COREG) 25 MG tablet Take 1 tablet (25 mg total) by mouth 2 (two) times daily with a meal. 07/14/14   Sonia Side  Londell Moh, MD  furosemide (LASIX) 20 MG tablet Take 1 tablet (20 mg total) by mouth daily. 08/29/14   Tasrif Ahmed, MD  Insulin Glargine (LANTUS SOLOSTAR) 100 UNIT/ML Solostar Pen Inject 44 Units into the skin at bedtime. 12/18/13   Dorian Heckle, MD  Insulin Pen Needle (B-D ULTRAFINE III SHORT PEN) 31G X 8 MM MISC Use as directed for once daily insulin injection. 03/19/14   Axel Filler, MD  lisinopril (PRINIVIL,ZESTRIL) 2.5 MG tablet Take 1 tablet (2.5 mg total) by mouth daily. 08/29/14   Tasrif Ahmed, MD  pantoprazole (PROTONIX) 40 MG tablet Take 1 tablet (40 mg total) by mouth daily. 05/07/14   Axel Filler, MD  simvastatin  (ZOCOR) 40 MG tablet Take 1 tablet (40 mg total) by mouth daily. 05/07/14   Axel Filler, MD   BP 161/61 mmHg  Pulse 60  Temp(Src) 97.9 F (36.6 C) (Oral)  Resp 20  SpO2 92% Physical Exam  Constitutional: He is oriented to person, place, and time. He appears well-developed and well-nourished.  Neck: Normal range of motion. Neck supple.  Cardiovascular: Normal heart sounds and intact distal pulses.   Pulmonary/Chest: Effort normal. No respiratory distress. He has wheezes. He has no rales. He exhibits no tenderness.  Musculoskeletal: He exhibits edema.  Neurological: He is alert and oriented to person, place, and time.  Skin: Skin is warm and dry.  Nursing note and vitals reviewed.   ED Course  Procedures (including critical care time) Labs Review Labs Reviewed  POCT I-STAT, CHEM 8 - Abnormal; Notable for the following:    Creatinine, Ser 1.40 (*)    All other components within normal limits    Imaging Review No results found.   MDM   1. CHF (congestive heart failure), NYHA class I, chronic, systolic        Billy Fischer, MD 08/31/14 2031

## 2014-09-01 ENCOUNTER — Ambulatory Visit (INDEPENDENT_AMBULATORY_CARE_PROVIDER_SITE_OTHER): Payer: Commercial Managed Care - HMO | Admitting: *Deleted

## 2014-09-01 ENCOUNTER — Ambulatory Visit (INDEPENDENT_AMBULATORY_CARE_PROVIDER_SITE_OTHER): Payer: Commercial Managed Care - HMO | Admitting: Physician Assistant

## 2014-09-01 ENCOUNTER — Encounter: Payer: Self-pay | Admitting: Physician Assistant

## 2014-09-01 VITALS — BP 150/80 | HR 54 | Ht 70.0 in | Wt 187.0 lb

## 2014-09-01 DIAGNOSIS — I5020 Unspecified systolic (congestive) heart failure: Secondary | ICD-10-CM

## 2014-09-01 DIAGNOSIS — I2510 Atherosclerotic heart disease of native coronary artery without angina pectoris: Secondary | ICD-10-CM

## 2014-09-01 DIAGNOSIS — Z95810 Presence of automatic (implantable) cardiac defibrillator: Secondary | ICD-10-CM

## 2014-09-01 DIAGNOSIS — I5022 Chronic systolic (congestive) heart failure: Secondary | ICD-10-CM

## 2014-09-01 DIAGNOSIS — I6522 Occlusion and stenosis of left carotid artery: Secondary | ICD-10-CM

## 2014-09-01 DIAGNOSIS — R0602 Shortness of breath: Secondary | ICD-10-CM

## 2014-09-01 DIAGNOSIS — I255 Ischemic cardiomyopathy: Secondary | ICD-10-CM

## 2014-09-01 DIAGNOSIS — E785 Hyperlipidemia, unspecified: Secondary | ICD-10-CM

## 2014-09-01 DIAGNOSIS — I10 Essential (primary) hypertension: Secondary | ICD-10-CM

## 2014-09-01 LAB — BASIC METABOLIC PANEL
BUN: 25 mg/dL — AB (ref 6–23)
CO2: 31 mEq/L (ref 19–32)
Calcium: 9.6 mg/dL (ref 8.4–10.5)
Chloride: 101 mEq/L (ref 96–112)
Creatinine, Ser: 1.6 mg/dL — ABNORMAL HIGH (ref 0.4–1.5)
GFR: 55.12 mL/min — AB (ref >60.00)
GLUCOSE: 78 mg/dL (ref 70–99)
POTASSIUM: 4.4 meq/L (ref 3.5–5.1)
Sodium: 138 mEq/L (ref 135–145)

## 2014-09-01 LAB — BRAIN NATRIURETIC PEPTIDE: Pro B Natriuretic peptide (BNP): 219 pg/mL — ABNORMAL HIGH (ref 0.0–100.0)

## 2014-09-01 MED ORDER — LISINOPRIL 5 MG PO TABS: 5.0000 mg | ORAL_TABLET | Freq: Every day | ORAL | Status: DC

## 2014-09-01 NOTE — Progress Notes (Signed)
CRT-D check in clinic. Battery longevity 7.1 years. Normal device function. No episodes recorded. Optivol increase 12-1 thru 12-6--stable at this time. # was given for pt to call to receive cell adapter for [employer] monitor. ROV in April with GT.

## 2014-09-01 NOTE — Patient Instructions (Signed)
Your physician has recommended you make the following change in your medication:  1. INCREASE LISINOPRIL TO 5 MG DAILY; NEW RX WAS SENT IN TODAY  LAB WORK TODAY; BMET, BNP  YOU WILL NEED REPEAT BMET IN 1 WEEK  Your physician has requested that you have an echocardiogram. Echocardiography is a painless test that uses sound waves to create images of your heart. It provides your doctor with information about the size and shape of your heart and how well your heart's chambers and valves are working. This procedure takes approximately one hour. There are no restrictions for this procedure.  Your physician has requested that you have a carotid duplex. This test is an ultrasound of the carotid arteries in your neck. It looks at blood flow through these arteries that supply the brain with blood. Allow one hour for this exam. There are no restrictions or special instructions.  You have been referred to [place], RN  Your physician recommends that you schedule a follow-up appointment in: [place], PAC  Low-Sodium Eating Plan Sodium raises blood pressure and causes water to be held in the body. Getting less sodium from food will help lower your blood pressure, reduce any swelling, and protect your heart, liver, and kidneys. We get sodium by adding salt (sodium chloride) to food. Most of our sodium comes from canned, boxed, and frozen foods. Restaurant foods, fast foods, and pizza are also very high in sodium. Even if you take medicine to lower your blood pressure or to reduce fluid in your body, getting less sodium from your food is important. WHAT IS MY PLAN? Most people should limit their sodium intake to 2,300 mg a day. Your health care provider recommends that you limit your sodium intake to __________ a day.  WHAT DO I NEED TO KNOW ABOUT THIS EATING PLAN? For the low-sodium eating plan, you will follow these general guidelines:  Choose foods with a % Daily  Value for sodium of less than 5% (as listed on the food label).   Use salt-free seasonings or herbs instead of table salt or sea salt.   Check with your health care provider or pharmacist before using salt substitutes.   Eat fresh foods.  Eat more vegetables and fruits.  Limit canned vegetables. If you do use them, rinse them well to decrease the sodium.   Limit cheese to 1 oz (28 g) per day.   Eat lower-sodium products, often labeled as "lower sodium" or "no salt added."  Avoid foods that contain monosodium glutamate (MSG). MSG is sometimes added to [place] food and some canned foods.  Check food labels (Nutrition Facts labels) on foods to learn how much sodium is in one serving.  Eat more home-cooked food and less restaurant, buffet, and fast food.  When eating at a restaurant, ask that your food be prepared with less salt or none, if possible.  HOW DO I READ FOOD LABELS FOR SODIUM INFORMATION? The Nutrition Facts label lists the amount of sodium in one serving of the food. If you eat more than one serving, you must multiply the listed amount of sodium by the number of servings. Food labels may also identify foods as:  Sodium free--Less than 5 mg in a serving.  Very low sodium--35 mg or less in a serving.  Low sodium--140 mg or less in a serving.  Light in sodium--50% less sodium in a serving. For example, if a food that usually has 300 mg  of sodium is changed to become light in sodium, it will have 150 mg of sodium.  Reduced sodium--25% less sodium in a serving. For example, if a food that usually has 400 mg of sodium is changed to reduced sodium, it will have 300 mg of sodium. WHAT FOODS CAN I EAT? Grains Low-sodium cereals, including oats, puffed wheat and rice, and shredded wheat cereals. Low-sodium crackers. Unsalted rice and pasta. Lower-sodium bread.  Vegetables Frozen or fresh vegetables. Low-sodium or reduced-sodium canned vegetables. Low-sodium or  reduced-sodium tomato sauce and paste. Low-sodium or reduced-sodium tomato and vegetable juices.  Fruits Fresh, frozen, and canned fruit. Fruit juice.  Meat and Other Protein Products Low-sodium canned tuna and salmon. Fresh or frozen meat, poultry, seafood, and fish. Lamb. Unsalted nuts. Dried beans, peas, and lentils without added salt. Unsalted canned beans. Homemade soups without salt. Eggs.  Dairy Milk. Soy milk. Ricotta cheese. Low-sodium or reduced-sodium cheeses. Yogurt.  Condiments Fresh and dried herbs and spices. Salt-free seasonings. Onion and garlic powders. Low-sodium varieties of mustard and ketchup. Lemon juice.  Fats and Oils Reduced-sodium salad dressings. Unsalted butter.  Other Unsalted popcorn and pretzels.  The items listed above may not be a complete list of recommended foods or beverages. Contact your dietitian for more options. WHAT FOODS ARE NOT RECOMMENDED? Grains Instant hot cereals. Bread stuffing, pancake, and biscuit mixes. Croutons. Seasoned rice or pasta mixes. Noodle soup cups. Boxed or frozen macaroni and cheese. Self-rising flour. Regular salted crackers. Vegetables Regular canned vegetables. Regular canned tomato sauce and paste. Regular tomato and vegetable juices. Frozen vegetables in sauces. Salted french fries. Olives. Angie Fava. Relishes. Sauerkraut. Salsa. Meat and Other Protein Products Salted, canned, smoked, spiced, or pickled meats, seafood, or fish. Bacon, ham, sausage, hot dogs, corned beef, chipped beef, and packaged luncheon meats. Salt pork. Jerky. Pickled herring. Anchovies, regular canned tuna, and sardines. Salted nuts. Dairy Processed cheese and cheese spreads. Cheese curds. Blue cheese and cottage cheese. Buttermilk.  Condiments Onion and garlic salt, seasoned salt, table salt, and sea salt. Canned and packaged gravies. Worcestershire sauce. Tartar sauce. Barbecue sauce. Teriyaki sauce. Soy sauce, including reduced sodium.  Steak sauce. Fish sauce. Oyster sauce. Cocktail sauce. Horseradish. Regular ketchup and mustard. Meat flavorings and tenderizers. Bouillon cubes. Hot sauce. Tabasco sauce. Marinades. Taco seasonings. Relishes. Fats and Oils Regular salad dressings. Salted butter. Margarine. Ghee. Bacon fat.  Other Potato and tortilla chips. Corn chips and puffs. Salted popcorn and pretzels. Canned or dried soups. Pizza. Frozen entrees and pot pies.  The items listed above may not be a complete list of foods and beverages to avoid. Contact your dietitian for more information. Document Released: 03/03/2002 Document Revised: 09/16/2013 Document Reviewed: 07/16/2013 [facility] Patient Information 2015 [place], [place]. This information is not intended to replace advice given to you by your health care provider. Make sure you discuss any questions you have with your health care provider.

## 2014-09-01 NOTE — Progress Notes (Signed)
Cardiology Office Note   Date:  09/01/2014   ID:  NAZIRE CHISMAN, DOB 1930/12/20, MRN [id number]  PCP:  Axel Filler, MD  Cardiologist/Electrophysiologist:  Dr. Cristopher Peru    History of Present Illness: [name] is a 80 y.o. male with a history of CAD status post PCI to the circumflex in 2004 with BMS and subsequent PCI to the circumflex with a DES in 2005, ischemic cardiomyopathy with a prior EF of 15-20%, HTN, HL, DM 2, prior TIA, arotid stenosis status post left carotid stent 2004.  Last echo in 2006 demonstrated an EF of 30-40%.  He has undergone ICD implantation. This was upgraded to a CRT-D device in 12/2012. Last seen by Dr. Lovena Le 12/2013. Patient was recently admitted 12/4-12/5 with acute on chronic systolic CHF.  He was followed by internal medicine. Weight decreased from 188-175 pounds with IV diuresis. He was discharged home on Lasix 20 mg daily (patient was supposed to be but was not taking upon admission)  and lisinopril 2.5 mg daily. Echocardiogram was ordered but never performed.  He returns for follow-up.   He feels that his breathing is much improved. He also notes that his LE edema is improved. He denies chest pain, orthopnea, PND or syncope. He denies any ICD shocks.   Studies:   - LHC (5/05):  Proximal LAD 100% with bridging collaterals (CTO), proximal circumflex 20%, mid circumflex 95% ISR, proximal-mid RCA 75%, EF 20% >>PCI: 3.0 x 28 mm Cypher DES to the mid circumflex  - Echo (7/06):  EF 30-40%, mildly reduced RV systolic function   Recent Labs: 07/29/2014: ALT 12; LDL (calc) 69 08/28/2014: Hemoglobin 14.9; Pro B Natriuretic peptide (BNP) 1170.0* 08/29/2014: BUN 21; Creatinine 1.37*; Potassium 3.7; Sodium 140    Recent Radiology: Dg Chest 2 View   08/28/2014  IMPRESSION: No acute disease.  Marked cardiomegaly and left basilar atelectasis.   Electronically Signed   By: Inge Rise M.D.   On: 08/28/2014 14:21      Wt Readings from Last 3  Encounters:  09/01/14 187 lb (84.823 kg)  08/29/14 175 lb 9.6 oz (79.652 kg)  08/05/14 182 lb 9.6 oz (82.827 kg)     Past Medical History  Diagnosis Date  . Anemia   . Cataracts, bilateral   . CHF (congestive heart failure)     ischemic CM EF 15-20% s/p AICD 05/24/04  . CAD (coronary artery disease)     s/p AMI, s/p PTCA & stent of cx 12/04 restent 5/05  . Diverticulosis of colon   . Hyperlipidemia   . PVD (peripheral vascular disease)     s/p L carotid PTCA/stent 2004  . Transient ischemic attack   . Erectile dysfunction   . Hyperkalemia 08/2008    K=5.7   . BBB (bundle branch block)     right  . HTN (hypertension)   . Elevated PSA   . Adenomatous colon polyp 02/14/2012  . ICD (implantable cardiac defibrillator) in place 12-25-2012    MDT CRTD upgrade by Dr Lovena Le  . Pacemaker   . Type II diabetes mellitus   . Myocardial infarction 1990  . Arthritis     "right hip" (12/25/2012)    Current Outpatient Prescriptions  Medication Sig Dispense Refill  . aspirin 81 MG tablet Take 81 mg by mouth daily.     . carvedilol (COREG) 25 MG tablet Take 1 tablet (25 mg total) by mouth 2 (two) times daily with a meal. 60 tablet 2  .  furosemide (LASIX) 20 MG tablet Take 1 tablet (20 mg total) by mouth daily. 30 tablet 1  . Insulin Glargine (LANTUS SOLOSTAR) 100 UNIT/ML Solostar Pen Inject 44 Units into the skin at bedtime. 15 mL 6  . Insulin Pen Needle (B-D ULTRAFINE III SHORT PEN) 31G X 8 MM MISC Use as directed for once daily insulin injection. 100 each 3  . lisinopril (PRINIVIL,ZESTRIL) 2.5 MG tablet Take 1 tablet (2.5 mg total) by mouth daily. 30 tablet 1  . pantoprazole (PROTONIX) 40 MG tablet Take 1 tablet (40 mg total) by mouth daily. 30 tablet 3  . simvastatin (ZOCOR) 40 MG tablet Take 1 tablet (40 mg total) by mouth daily. 30 tablet 3  . albuterol (PROVENTIL HFA;VENTOLIN HFA) 108 (90 BASE) MCG/ACT inhaler Inhale 1-2 puffs into the lungs every 4 (four) hours as needed for wheezing or  shortness of breath. 1 Inhaler 0   No current facility-administered medications for this visit.     Allergies:   Review of patient's allergies indicates no known allergies.   Social History:  The patient  reports that he quit smoking about 46 years ago. His smoking use included Cigarettes. He smoked 0.00 packs per day. He has never used smokeless tobacco. He reports that he does not drink alcohol or use illicit drugs.   Family History:  The patient's family history includes Diabetes in his brother and mother.    ROS:  Please see the history of present illness.      All other systems reviewed and negative.    PHYSICAL EXAM: VS:  BP 150/80 mmHg  Pulse 54  Ht 5\' 10"  (1.778 m)  Wt 187 lb (84.823 kg)  BMI 26.83 kg/m2 Well nourished, well developed, in no acute distress HEENT: normal Neck: no JVD Cardiac:  normal S1, S2;  RRR; no murmur   Lungs:  Decreased breath sounds bilaterally, no wheezing, rhonchi or rales Abd: soft, nontender, no hepatomegaly Ext: 2+ bilateral LE edema midway up The calf Skin: warm and dry Neuro:  CNs 2-12 intact, no focal abnormalities noted  EKG:  NSR, HR 54, V paced      ASSESSMENT AND PLAN:  1.  Chronic systolic CHF (congestive heart failure):  Volume stable. Device was interrogated today. Optivol is now back to baseline. He continues to have some increased LE edema. I will check a follow-up BNP. I suspect we will keep him on his current dose of Lasix for now. He does admit to eating foods that are rich in salt. I had a long discussion with him today regarding reducing his salt intake.    -   [facility] to [facility]     -  Check BMET, BNP today. 2.  Ischemic cardiomyopathy:  Continue beta blocker and ACEI.    -  Arrange follow-up echocardiogram. 3.  Coronary artery disease: No angina. Continue aspirin, beta blocker, statin. 4.  Essential hypertension:   Blood pressure elevated. Increase lisinopril to 5 mg daily.     -  Check BMET in one week. 5.   Hyperlipidemia:  Continue statin.   6.  Biventricular ICD (implantable cardioverter-defibrillator) in place:  Device interrogated today. It is functioning appropriately. He paces his LV before his RV. FU with EP as planned.  7.  Carotid stenosis, left - s/p L carotid stent in 2004 - Plan: Arrange FU Carotid duplex   Disposition:   FU with me in 2 mos and Dr. Cristopher Peru in April as planned.    Signed, Nicki Reaper  Jorene Minors, MHS 09/01/2014 11:06 AM    [facility] [place], [place], [place]  [address] Phone: [phone]; Fax: [phone]

## 2014-09-02 ENCOUNTER — Telehealth: Payer: Self-pay | Admitting: *Deleted

## 2014-09-02 DIAGNOSIS — I5020 Unspecified systolic (congestive) heart failure: Secondary | ICD-10-CM

## 2014-09-02 LAB — MDC_IDC_ENUM_SESS_TYPE_INCLINIC
Battery Voltage: 2.99 V
Brady Statistic AP VP Percent: 9.11 %
Brady Statistic AP VS Percent: 0.01 %
Brady Statistic AS VP Percent: 90.86 %
HighPow Impedance: 190 Ohm
HighPow Impedance: 54 Ohm
Lead Channel Impedance Value: 342 Ohm
Lead Channel Impedance Value: 456 Ohm
Lead Channel Impedance Value: 760 Ohm
Lead Channel Pacing Threshold Amplitude: 0.75 V
Lead Channel Pacing Threshold Pulse Width: 0.4 ms
Lead Channel Pacing Threshold Pulse Width: 0.6 ms
Lead Channel Sensing Intrinsic Amplitude: 24.875 mV
Lead Channel Setting Pacing Amplitude: 2 V
Lead Channel Setting Pacing Amplitude: 2.5 V
Lead Channel Setting Pacing Pulse Width: 0.6 ms
Lead Channel Setting Sensing Sensitivity: 0.3 mV
MDC IDC MSMT BATTERY REMAINING LONGEVITY: 83 mo
MDC IDC MSMT LEADCHNL LV IMPEDANCE VALUE: 532 Ohm
MDC IDC MSMT LEADCHNL RA IMPEDANCE VALUE: 418 Ohm
MDC IDC MSMT LEADCHNL RA PACING THRESHOLD AMPLITUDE: 1 V
MDC IDC MSMT LEADCHNL RA PACING THRESHOLD PULSEWIDTH: 0.4 ms
MDC IDC MSMT LEADCHNL RA SENSING INTR AMPL: 2 mV
MDC IDC MSMT LEADCHNL RV PACING THRESHOLD AMPLITUDE: 0.75 V
MDC IDC SESS DTM: 20151208174322
MDC IDC SET LEADCHNL LV PACING AMPLITUDE: 1.5 V
MDC IDC SET LEADCHNL RV PACING PULSEWIDTH: 0.4 ms
MDC IDC SET ZONE DETECTION INTERVAL: 250 ms
MDC IDC SET ZONE DETECTION INTERVAL: 300 ms
MDC IDC STAT BRADY AS VS PERCENT: 0.02 %
MDC IDC STAT BRADY RA PERCENT PACED: 9.12 %
MDC IDC STAT BRADY RV PERCENT PACED: 99.75 %
Zone Setting Detection Interval: 350 ms
Zone Setting Detection Interval: 360 ms
Zone Setting Detection Interval: 450 ms

## 2014-09-02 NOTE — Telephone Encounter (Signed)
pt notified about lab results with verbal understanding and absolutely refused to come in Friday 12/11 AM and said he will come in Friday 12/11 after lunch. i did explain to the pt that the PA wanted hjim to come in the AM so he could get results earlier. Pt was yelling at me over and over again saying he will NOT be in until after lunch for his lab work.

## 2014-09-03 ENCOUNTER — Encounter (HOSPITAL_COMMUNITY): Payer: Self-pay | Admitting: Internal Medicine

## 2014-09-04 ENCOUNTER — Other Ambulatory Visit: Payer: Commercial Managed Care - HMO

## 2014-09-08 ENCOUNTER — Other Ambulatory Visit: Payer: Commercial Managed Care - HMO

## 2014-09-08 ENCOUNTER — Telehealth: Payer: Self-pay

## 2014-09-09 ENCOUNTER — Other Ambulatory Visit: Payer: Commercial Managed Care - HMO

## 2014-09-09 ENCOUNTER — Encounter (HOSPITAL_COMMUNITY): Payer: Commercial Managed Care - HMO

## 2014-09-09 ENCOUNTER — Other Ambulatory Visit (HOSPITAL_COMMUNITY): Payer: Commercial Managed Care - HMO

## 2014-09-11 ENCOUNTER — Encounter: Payer: Self-pay | Admitting: Internal Medicine

## 2014-09-17 ENCOUNTER — Other Ambulatory Visit: Payer: Self-pay | Admitting: Internal Medicine

## 2014-09-21 ENCOUNTER — Other Ambulatory Visit: Payer: Self-pay | Admitting: Internal Medicine

## 2014-09-21 ENCOUNTER — Other Ambulatory Visit (HOSPITAL_COMMUNITY): Payer: Commercial Managed Care - HMO

## 2014-09-21 ENCOUNTER — Other Ambulatory Visit: Payer: Commercial Managed Care - HMO

## 2014-09-21 ENCOUNTER — Encounter (HOSPITAL_COMMUNITY): Payer: Commercial Managed Care - HMO

## 2014-10-01 ENCOUNTER — Encounter: Payer: Commercial Managed Care - HMO | Admitting: Internal Medicine

## 2014-10-05 ENCOUNTER — Encounter: Payer: Commercial Managed Care - HMO | Admitting: *Deleted

## 2014-10-06 ENCOUNTER — Telehealth: Payer: Self-pay | Admitting: *Deleted

## 2014-10-06 NOTE — Telephone Encounter (Signed)
I spoke with the patient and reminded him to send a transmission today.

## 2014-10-13 ENCOUNTER — Ambulatory Visit (HOSPITAL_COMMUNITY): Payer: Commercial Managed Care - HMO | Attending: Cardiology | Admitting: Cardiology

## 2014-10-13 ENCOUNTER — Other Ambulatory Visit (HOSPITAL_COMMUNITY): Payer: Commercial Managed Care - HMO

## 2014-10-13 ENCOUNTER — Other Ambulatory Visit (INDEPENDENT_AMBULATORY_CARE_PROVIDER_SITE_OTHER): Payer: Commercial Managed Care - HMO | Admitting: *Deleted

## 2014-10-13 DIAGNOSIS — I6523 Occlusion and stenosis of bilateral carotid arteries: Secondary | ICD-10-CM | POA: Insufficient documentation

## 2014-10-13 DIAGNOSIS — I5022 Chronic systolic (congestive) heart failure: Secondary | ICD-10-CM

## 2014-10-13 DIAGNOSIS — I6522 Occlusion and stenosis of left carotid artery: Secondary | ICD-10-CM

## 2014-10-13 LAB — BASIC METABOLIC PANEL
BUN: 42 mg/dL — ABNORMAL HIGH (ref 6–23)
CO2: 32 meq/L (ref 19–32)
Calcium: 9.9 mg/dL (ref 8.4–10.5)
Chloride: 103 mEq/L (ref 96–112)
Creatinine, Ser: 1.79 mg/dL — ABNORMAL HIGH (ref 0.40–1.50)
GFR: 47.02 mL/min — AB (ref >60.00)
GLUCOSE: 95 mg/dL (ref 70–99)
Potassium: 4.2 mEq/L (ref 3.5–5.1)
SODIUM: 137 meq/L (ref 135–145)

## 2014-10-13 NOTE — Progress Notes (Signed)
Carotid duplex performed 

## 2014-10-14 ENCOUNTER — Encounter: Payer: Self-pay | Admitting: Physician Assistant

## 2014-10-15 ENCOUNTER — Telehealth: Payer: Self-pay | Admitting: *Deleted

## 2014-10-15 NOTE — Telephone Encounter (Signed)
LMOVM w/ my direct #.   Re: explain his [employer] and inquire if he needs a cell adapter ordered

## 2014-10-19 ENCOUNTER — Telehealth: Payer: Self-pay | Admitting: *Deleted

## 2014-10-19 ENCOUNTER — Other Ambulatory Visit (HOSPITAL_COMMUNITY): Payer: Commercial Managed Care - HMO

## 2014-10-19 NOTE — Telephone Encounter (Signed)
Pt notified about lab and carotid results. Pt aware to decrease lasix to QOD, bmet 2/8 when he sees [place]. [place], Pt had to reschedule echo from today to 1/29 due to snowed in. Pt verbalized understanding to Plan of Care.

## 2014-10-20 NOTE — Telephone Encounter (Signed)
Left 2nd msg w/ my direct #.

## 2014-10-23 ENCOUNTER — Ambulatory Visit (HOSPITAL_COMMUNITY): Payer: Commercial Managed Care - HMO | Attending: Internal Medicine | Admitting: Radiology

## 2014-10-23 DIAGNOSIS — I5022 Chronic systolic (congestive) heart failure: Secondary | ICD-10-CM

## 2014-10-23 DIAGNOSIS — I509 Heart failure, unspecified: Secondary | ICD-10-CM

## 2014-10-23 DIAGNOSIS — I10 Essential (primary) hypertension: Secondary | ICD-10-CM | POA: Diagnosis not present

## 2014-10-23 DIAGNOSIS — E119 Type 2 diabetes mellitus without complications: Secondary | ICD-10-CM | POA: Diagnosis not present

## 2014-10-23 DIAGNOSIS — E785 Hyperlipidemia, unspecified: Secondary | ICD-10-CM | POA: Insufficient documentation

## 2014-10-23 MED ORDER — PERFLUTREN LIPID MICROSPHERE
3.0000 mL | Freq: Once | INTRAVENOUS | Status: AC
  Administered 2014-10-23: 3 mL via INTRAVENOUS

## 2014-10-23 NOTE — Progress Notes (Signed)
Echocardiogram performed with definity

## 2014-10-26 ENCOUNTER — Encounter: Payer: Self-pay | Admitting: Physician Assistant

## 2014-10-27 ENCOUNTER — Other Ambulatory Visit: Payer: Self-pay | Admitting: Internal Medicine

## 2014-10-29 NOTE — Telephone Encounter (Signed)
Pt has ROV w/ Richardson Dopp on 11/02/14.

## 2014-11-02 ENCOUNTER — Encounter: Payer: Self-pay | Admitting: Physician Assistant

## 2014-11-02 ENCOUNTER — Encounter: Payer: Commercial Managed Care - HMO | Admitting: Physician Assistant

## 2014-11-02 DIAGNOSIS — I10 Essential (primary) hypertension: Secondary | ICD-10-CM | POA: Insufficient documentation

## 2014-11-02 NOTE — Progress Notes (Deleted)
Cardiology Office Note   Date:  11/02/2014   ID:  [name], DOB [birth year], MRN [id number]  PCP:  Robert Filler, MD  Cardiologist/Electrophysiologist:  Dr. Cristopher Peru    No chief complaint on file.    History of Present Illness: [name] is a 81 y.o. male with a hx of CAD status post PCI to the circumflex in 2004 with BMS and subsequent PCI to the circumflex with a DES in 2005, ischemic cardiomyopathy with a prior EF of 15-20%, HTN, HL, DM 2, prior TIA, arotic stenosis status post left carotid stent 2004. Echo in 2006 demonstrated an EF of 30-40%. He has undergone ICD implantation. This was upgraded to a CRT-D device in 12/2012.   Admitted [id number] with a/c systolic CHF. He lost 13 pounds with IV diuresis.   Last seen by me 09/01/14.  I tried to get him enrolled in the [facility] clinic.  The RN has had a hard time getting in touch with him.  I arranged a FU echo.  This was done recently and demonstrated a large pericardial effusion, EF 25% and mild AS (probably mild to moderate).    ***   Studies:  Echocardiogram 09/2014 - EF 25% with akinesis of the distal anterior, distal inferior, distal lateral and apical walls, hypokinesis elsewhere. Grade 1 diastolic dysfunction. - Aortic valve: Peak and mean gradients through the valve are 11 and 7 mm Hgrespectively. This is consistent with very mild AS - May be low due to depressed LVEF 2 D images suggest aortic valve is mild to moderately stenotic. - Pericardium, extracardiac: There is a large pericardial effusion mainly inferior.  Measures approximately 18 mm In maximial dimension. No RV/RA collapse, IVC is normal Doppler evaluation not done.  Carotid Doppler US 09/2014 Bilateral ICA 1-39%; L vertebral artery ocluded >> FU 2 years   Past Medical History  Diagnosis Date  . Anemia   . Cataracts, bilateral   . CHF (congestive heart failure)     ischemic CM EF 15-20% s/p AICD 05/24/04  . CAD (coronary artery disease)    s/p AMI, s/p PTCA & stent of cx 12/04 restent 5/05  . Diverticulosis of colon   . Hyperlipidemia   . PVD (peripheral vascular disease)     s/p L carotid PTCA/stent 2004  . Transient ischemic attack   . Erectile dysfunction   . Hyperkalemia 08/2008    K=5.7   . BBB (bundle branch block)     right  . HTN (hypertension)   . Elevated PSA   . Adenomatous colon polyp 02/14/2012  . ICD (implantable cardiac defibrillator) in place 12-25-2012    MDT CRTD upgrade by Dr Lovena Le  . Pacemaker   . Type II diabetes mellitus   . Myocardial infarction 1990  . Arthritis     "right hip" (12/25/2012)  . Carotid stenosis     a.  s/p L carotid stent 2004;  b. Carotid US (09/2014): Bilateral ICA 1-39%, left ECA >59%, normal subclavian bilaterally, occluded left vertebral >> FU 2 years  . Pericardial effusion     Echocardiogram (09/2014): EF 25% with distal anterior, distal inferior, distal lateral and apical akinesis, grade 1 diastolic dysfunction, very mild aortic stenosis (mean 7 mmHg) - this may be depressed due to low EF (2-D images suggest mild to moderate aortic stenosis), large pericardial effusion, no RA collapse    Past Surgical History  Procedure Laterality Date  . Cardiac defibrillator placement  05/24/2004    Implantation of  a MDT single-chamber defibrillator  . Carotid stent  09/11/2003    Percutaneous transluminal angioplasty and stent placement of the left internal carotid artery.  . Biv icd upgrade  12/25/2012    MDT CRTD upgrade by Dr Lovena Le for ischemic cardiomyopathy and worsening conduction system disease  . Cataract extraction w/ intraocular lens  implant, bilateral Bilateral ~ 2010  . Cardiac catheterization  06/2003,  01/2004  . Coronary angioplasty with stent placement  1990    "2" (12/25/2012)  . Bi-ventricular implantable cardioverter defibrillator upgrade N/A 12/25/2012    Procedure: BI-VENTRICULAR IMPLANTABLE CARDIOVERTER DEFIBRILLATOR UPGRADE;  Surgeon: Evans Lance, MD;  Location: [facility]  CATH LAB;  Service: Cardiovascular;  Laterality: N/A;  . Lead revision N/A 12/25/2012    Procedure: LEAD REVISION;  Surgeon: Evans Lance, MD;  Location: [facility] CATH LAB;  Service: Cardiovascular;  Laterality: N/A;     Current Outpatient Prescriptions  Medication Sig Dispense Refill  . albuterol (PROVENTIL HFA;VENTOLIN HFA) 108 (90 BASE) MCG/ACT inhaler Inhale 1-2 puffs into the lungs every 4 (four) hours as needed for wheezing [name] shortness of breath. 1 Inhaler 0  . aspirin 81 MG tablet Take 81 mg by mouth daily.     . carvedilol (COREG) 25 MG tablet Take 1 tablet (25 mg total) by mouth 2 (two) times daily with a meal. 60 tablet 3  . furosemide (LASIX) 20 MG tablet Take 1 tablet (20 mg total) by mouth daily. 30 tablet 1  . Insulin Glargine (LANTUS SOLOSTAR) 100 UNIT/ML Solostar Pen Inject 44 Units into the skin at bedtime. 15 mL 6  . Insulin Pen Needle (B-D ULTRAFINE III SHORT PEN) 31G X 8 MM MISC Use as directed for once daily insulin injection. 100 each 3  . lisinopril (PRINIVIL,ZESTRIL) 5 MG tablet Take 1 tablet (5 mg total) by mouth daily. 30 tablet 11  . pantoprazole (PROTONIX) 40 MG tablet Take 1 tablet (40 mg total) by mouth daily. 30 tablet 5  . simvastatin (ZOCOR) 40 MG tablet Take 1 tablet (40 mg total) by mouth daily. 30 tablet 5   No current facility-administered medications for this visit.    Allergies:   Review of patient's allergies indicates no known allergies.    Social History:  The patient  reports that he quit smoking about 46 years ago. His smoking use included Cigarettes. He has never used smokeless tobacco. He reports that he does not drink alcohol [name] use illicit drugs.   Family History:  The patient's ***family history includes Diabetes in his brother and mother. There is no history of Heart attack [name] Stroke.    ROS:  Please see the history of present illness.   Otherwise, review of systems are positive for {NONE DEFAULTED:18576}.   All other systems are reviewed and  negative.    PHYSICAL EXAM: VS:  There were no vitals taken for this visit.    Wt Readings from Last 3 Encounters:  09/01/14 187 lb (84.823 kg)  08/29/14 175 lb 9.6 oz (79.652 kg)  08/05/14 182 lb 9.6 oz (82.827 kg)     GEN: Well nourished, well developed, in no acute distress HEENT: normal Neck: *** JVD, ***carotid bruits, no masses Cardiac:  Normal S1/S2, ***RRR; *** murmur ***, *** no rubs [name] gallops, {NUMBERS; 1+ TO 4+, TRACE/RARE:14493} edema  Respiratory:  ***clear to auscultation bilaterally, no wheezing, rhonchi [name] rales. GI: ***soft, nontender, nondistended, + BS MS: no deformity [name] atrophy Skin: warm and dry  Neuro:  CNs II-XII intact, Strength and  sensation are intact Psych: Normal affect   EKG:  EKG {ACTION; IS/IS [id number] ordered today.  It demonstrates:   ***   Recent Labs: 07/29/2014: ALT 12 08/28/2014: Hemoglobin 14.9; Platelets 169 09/01/2014: Pro B Natriuretic peptide (BNP) 219.0* 10/13/2014: BUN 42*; Creatinine 1.79*; Potassium 4.2; Sodium 137    Lipid Panel    Component Value Date/Time   CHOL 127 07/29/2014 1102   TRIG 63 07/29/2014 1102   HDL 45 07/29/2014 1102   CHOLHDL 2.8 07/29/2014 1102   VLDL 13 07/29/2014 1102   LDLCALC 69 07/29/2014 1102      ASSESSMENT AND PLAN:  1.  ***Chronic systolic CHF (congestive heart failure): Volume stable. Device was interrogated today. Optivol is now back to baseline. He continues to have some increased LE edema. I will check a follow-up BNP. I suspect we will keep him on his current dose of Lasix for now. He does admit to eating foods that are rich in salt. I had a long discussion with him today regarding reducing his salt intake.  - Refer to [facility] clinic   - Check BMET, BNP today. 2. Ischemic cardiomyopathy: Continue beta blocker and ACEI.  - Arrange follow-up echocardiogram. 3. Coronary artery disease: No angina. Continue aspirin, beta blocker, statin. 4. Essential hypertension: Blood  pressure elevated. Increase lisinopril to 5 mg daily.   - Check BMET in one week. 5. Hyperlipidemia: Continue statin.  6. Biventricular ICD (implantable cardioverter-defibrillator) in place: Device interrogated today. It is functioning appropriately. He paces his LV before his RV. FU with EP as planned.  7. Carotid stenosis, left - s/p L carotid stent in 2004 - Plan: Arrange FU Carotid duplex   Current medicines are reviewed at length with the patient today.  The patient {ACTIONS; HAS/DOES NOT HAVE:19233} concerns regarding medicines.  The following changes have been made:  {PLAN; NO CHANGE:13088:s}  Labs/ tests ordered today include: *** No orders of the defined types were placed in this encounter.     Disposition:   FU with *** in {gen number [id number] {TIME; UNITS DAY/WEEK/MONTH:19136}   Signed, Versie Starks, MHS 11/02/2014 1:53 PM    [facility] [place], [place], [place]  [address] Phone: [phone]; Fax: [phone]

## 2014-11-02 NOTE — Progress Notes (Signed)
This encounter was created in error - please disregard.

## 2014-11-09 ENCOUNTER — Telehealth: Payer: Self-pay | Admitting: *Deleted

## 2014-11-09 DIAGNOSIS — I5022 Chronic systolic (congestive) heart failure: Secondary | ICD-10-CM

## 2014-11-09 DIAGNOSIS — I255 Ischemic cardiomyopathy: Secondary | ICD-10-CM

## 2014-11-09 DIAGNOSIS — I2510 Atherosclerotic heart disease of native coronary artery without angina pectoris: Secondary | ICD-10-CM

## 2014-11-09 NOTE — Telephone Encounter (Signed)
pt notified of echo results as well as needing repeat echo to reassess effusion on 2/19;  pt scheduled today to see Nicki Reaper 2/22 to go over echo results. Pt verbalized understanding to Plan of care.

## 2014-11-13 ENCOUNTER — Other Ambulatory Visit (HOSPITAL_COMMUNITY): Payer: Medicare HMO

## 2014-11-16 ENCOUNTER — Ambulatory Visit (INDEPENDENT_AMBULATORY_CARE_PROVIDER_SITE_OTHER): Payer: Commercial Managed Care - HMO | Admitting: Physician Assistant

## 2014-11-16 ENCOUNTER — Encounter (HOSPITAL_COMMUNITY): Payer: Self-pay | Admitting: Internal Medicine

## 2014-11-16 ENCOUNTER — Encounter: Payer: Self-pay | Admitting: Physician Assistant

## 2014-11-16 VITALS — BP 120/70 | HR 50 | Ht 70.0 in | Wt 186.0 lb

## 2014-11-16 DIAGNOSIS — I10 Essential (primary) hypertension: Secondary | ICD-10-CM | POA: Diagnosis not present

## 2014-11-16 DIAGNOSIS — I2510 Atherosclerotic heart disease of native coronary artery without angina pectoris: Secondary | ICD-10-CM | POA: Diagnosis not present

## 2014-11-16 DIAGNOSIS — Z95810 Presence of automatic (implantable) cardiac defibrillator: Secondary | ICD-10-CM

## 2014-11-16 DIAGNOSIS — I319 Disease of pericardium, unspecified: Secondary | ICD-10-CM | POA: Diagnosis not present

## 2014-11-16 DIAGNOSIS — E785 Hyperlipidemia, unspecified: Secondary | ICD-10-CM | POA: Diagnosis not present

## 2014-11-16 DIAGNOSIS — I350 Nonrheumatic aortic (valve) stenosis: Secondary | ICD-10-CM

## 2014-11-16 DIAGNOSIS — N189 Chronic kidney disease, unspecified: Secondary | ICD-10-CM | POA: Diagnosis not present

## 2014-11-16 DIAGNOSIS — I6523 Occlusion and stenosis of bilateral carotid arteries: Secondary | ICD-10-CM

## 2014-11-16 DIAGNOSIS — I5022 Chronic systolic (congestive) heart failure: Secondary | ICD-10-CM

## 2014-11-16 DIAGNOSIS — I255 Ischemic cardiomyopathy: Secondary | ICD-10-CM | POA: Diagnosis not present

## 2014-11-16 DIAGNOSIS — I3139 Other pericardial effusion (noninflammatory): Secondary | ICD-10-CM

## 2014-11-16 DIAGNOSIS — I313 Pericardial effusion (noninflammatory): Secondary | ICD-10-CM

## 2014-11-16 MED ORDER — FUROSEMIDE 20 MG PO TABS: 20.0000 mg | ORAL_TABLET | Freq: Every day | ORAL | Status: DC

## 2014-11-16 MED ORDER — LISINOPRIL 5 MG PO TABS: 5.0000 mg | ORAL_TABLET | Freq: Every day | ORAL | Status: DC

## 2014-11-16 NOTE — Patient Instructions (Signed)
Your physician has requested that you have an echocardiogram THIS WEEK . Echocardiography is a painless test that uses sound waves to create images of your heart. It provides your doctor with information about the size and shape of your heart and how well your heart's chambers and valves are working. This procedure takes approximately one hour. There are no restrictions for this procedure.  REFILLS SENT IN FOR LASIX, LISINOPRIL  FOLLOW UP WITH DR. Lovena Le IN ABOUT 3-4 WEEKS  LAB WORK TODAY; BMET

## 2014-11-16 NOTE — Progress Notes (Addendum)
Cardiology Office Note   Date:  11/16/2014   ID:  [name], DOB [birth year], MRN [id number]  PCP:  Robert Filler, MD  Cardiologist/Electrophysiologist:  Dr. Cristopher Peru   Chief Complaint  Patient presents with  . Congestive Heart Failure    follow up  . Pericardial Effusion     History of Present Illness: [name] is a 81 y.o. male with a hx of CAD status post PCI to the circumflex in 2004 with BMS and subsequent PCI to the circumflex with a DES in 2005, ischemic cardiomyopathy with a prior EF of 15-20%, HTN, HL, DM 2, prior TIA, carotid stenosis status post left carotid stent 2004, [name]. Echo in 2006 demonstrated an EF of 30-40%. He is s/p ICD implantation. This was upgraded to a CRT-D device in 12/2012.   Admitted [id number] with a/c systolic CHF.   I saw him in FU 09/01/14.  FU echo was arranged and I tried to get him into the ICM clinic.  It has been difficult to reach him by phone to set up ICM clinic FU.  He continues to feel well.  The patient denies chest pain, shortness of breath, syncope, orthopnea, PND or significant pedal edema.  He is NYHA 2-2b.  Denies ICD discharges.   Studies:  - LHC (5/05): Proximal LAD 100% with bridging collaterals (CTO), proximal circumflex 20%, mid circumflex 95% ISR, proximal-mid RCA 75%, EF 20% >>PCI: 3.0 x 28 mm Cypher DES to the mid circumflex - Echo (7/06): EF 30-40%, mildly reduced RV systolic function  Echocardiogram 10/23/14 - EF 25%, distal anterior, distal inferior, distal lateral and apical akinesis, grade 1 diastolic dysfunction - Mild aortic stenosis (peak 11 mmHg, mean 7 mmHg), may be low due to depressed LVEF >> 2-D images suggest mild to moderate aortic stenosis - Large pericardial effusion-no RV/RA collapse   Past Medical History  Diagnosis Date  . Anemia   . Cataracts, bilateral   . Chronic systolic CHF (congestive heart failure)     a. ischemic CM EF 15-20%;  b. s/p AICD 05/24/04;  c. Echo 7/06: EF  30-40%, mild reduced RVSF  . CAD (coronary artery disease)     a. s/p AMI, s/p PTCA & stent of cx 12/04;  b. LHC (5/05): Proximal LAD 100% with bridging collaterals (CTO), proximal circumflex 20%, mid circumflex 95% ISR, proximal-mid RCA 75%, EF 20% >>PCI: 3.0 x 28 mm Cypher DES to the mid CFX  . Diverticulosis of colon   . Hyperlipidemia   . PVD (peripheral vascular disease)     s/p L carotid PTCA/stent 2004  . Transient ischemic attack   . Erectile dysfunction   . Hyperkalemia 08/2008    K=5.7   . BBB (bundle branch block)     right  . HTN (hypertension)   . Elevated PSA   . Adenomatous colon polyp 02/14/2012  . ICD (implantable cardiac defibrillator) in place 12-25-2012    MDT CRTD upgrade by Dr Lovena Le  . Pacemaker   . Type II diabetes mellitus   . Myocardial infarction 1990  . Arthritis     "right hip" (12/25/2012)  . Carotid stenosis     a. s/p L carotid stent 2004;  b. Carotid US (09/2014): Bilateral ICA 1-39%, left ECA >59%, normal subclavian bilaterally, occluded left vertebral >> FU 2 years  . Pericardial effusion     Echocardiogram (09/2014): EF 25% with distal anterior, distal inferior, distal lateral and apical akinesis, grade 1 diastolic dysfunction, very mild aortic  stenosis (mean 7 mmHg) - this may be depressed due to low EF (2-D images suggest mild to moderate aortic stenosis), large pericardial effusion, no RA collapse    Past Surgical History  Procedure Laterality Date  . Cardiac defibrillator placement  05/24/2004    Implantation of a MDT single-chamber defibrillator  . Carotid stent  09/11/2003    Percutaneous transluminal angioplasty and stent placement of the left internal carotid artery.  . Biv icd upgrade  12/25/2012    MDT CRTD upgrade by Dr Lovena Le for ischemic cardiomyopathy and worsening conduction system disease  . Cataract extraction w/ intraocular lens  implant, bilateral Bilateral ~ 2010  . Cardiac catheterization  06/2003,  01/2004  . Coronary angioplasty  with stent placement  1990    "2" (12/25/2012)  . Bi-ventricular implantable cardioverter defibrillator upgrade N/A 12/25/2012    Procedure: BI-VENTRICULAR IMPLANTABLE CARDIOVERTER DEFIBRILLATOR UPGRADE;  Surgeon: Evans Lance, MD;  Location: [name];  Service: Cardiovascular;  Laterality: N/A;  . Lead revision N/A 12/25/2012    Procedure: LEAD REVISION;  Surgeon: Evans Lance, MD;  [facility]: [name];  Service: Cardiovascular;  Laterality: N/A;     Current Outpatient Prescriptions  Medication Sig Dispense Refill  . albuterol (PROVENTIL HFA;VENTOLIN HFA) 108 (90 BASE) MCG/ACT inhaler Inhale 1-2 puffs into the lungs every 4 (four) hours as needed for wheezing or shortness of breath. 1 Inhaler 0  . aspirin 81 MG tablet Take 81 mg by mouth daily.     . carvedilol (COREG) 25 MG tablet Take 1 tablet (25 mg total) by mouth 2 (two) times daily with a meal. 60 tablet 3  . furosemide (LASIX) 20 MG tablet Take 1 tablet (20 mg total) by mouth daily. 30 tablet 1  . Insulin Glargine (LANTUS SOLOSTAR) 100 UNIT/ML Solostar Pen Inject 44 Units into the skin at bedtime. 15 mL 6  . Insulin Pen Needle (B-D ULTRAFINE III SHORT PEN) 31G X 8 MM MISC Use as directed for once daily insulin injection. 100 each 3  . lisinopril (PRINIVIL,ZESTRIL) 5 MG tablet Take 1 tablet (5 mg total) by mouth daily. 30 tablet 11  . pantoprazole (PROTONIX) 40 MG tablet Take 1 tablet (40 mg total) by mouth daily. 30 tablet 5  . simvastatin (ZOCOR) 40 MG tablet Take 1 tablet (40 mg total) by mouth daily. 30 tablet 5   No current facility-administered medications for this visit.    Allergies:   Review of patient's allergies indicates no known allergies.    Social History:  The patient  reports that he quit smoking about 46 years ago. His smoking use included Cigarettes. He has never used smokeless tobacco. He reports that he does not drink alcohol or use illicit drugs.   Family History:  The patient's family history includes  Diabetes in his brother and mother. There is no history of Heart attack or Stroke.    ROS:   Please see the history of present illness.   Review of Systems  Constitution: Negative for fever.  Respiratory: Negative for cough.   Gastrointestinal: Negative for hematochezia, melena, nausea and vomiting.  Genitourinary: Negative for hematuria.  All other systems reviewed and are negative.   PHYSICAL EXAM: VS:  BP 120/70 mmHg  Pulse 50  Ht 5\' 10"  (1.778 m)  Wt 186 lb (84.369 kg)  BMI 26.69 kg/m2    Wt Readings from Last 3 Encounters:  11/16/14 186 lb (84.369 kg)  09/01/14 187 lb (84.823 kg)  08/29/14 175 lb 9.6  oz (79.652 kg)     GEN: Well nourished, well developed, in no acute distress HEENT: normal Neck: no JVD, no masses Cardiac:  Normal 99991111, RRR; 1/6 systolic murmur RUSB, no rubs or gallops, no edema  Respiratory:  clear to auscultation bilaterally, no wheezing, rhonchi or rales. GI: soft, nontender, nondistended, + BS MS: no deformity or atrophy Skin: warm and dry  Neuro:  CNs II-XII intact, Strength and sensation are intact Psych: Normal affect   EKG:  EKG is ordered today.  It demonstrates:   V paced, HR 50   Recent Labs: 07/29/2014: ALT 12 08/28/2014: Hemoglobin 14.9; Platelets 169 09/01/2014: Pro B Natriuretic peptide (BNP) 219.0* 10/13/2014: BUN 42*; Creatinine 1.79*; Potassium 4.2; Sodium 137    Lipid Panel    Component Value Date/Time   CHOL 127 07/29/2014 1102   TRIG 63 07/29/2014 1102   HDL 45 07/29/2014 1102   CHOLHDL 2.8 07/29/2014 1102   VLDL 13 07/29/2014 1102   LDLCALC 69 07/29/2014 1102      ASSESSMENT AND PLAN:  1.  Chronic systolic CHF (congestive heart failure): Volume stable.  He tells me he feels great and is NYHA 2-2b.  He is out of his Lasix.  This will be refilled.    -  BMET today. 2. Ischemic cardiomyopathy: Continue beta blocker and ACEI. 3. Coronary artery disease: No angina. Continue aspirin, beta blocker, statin. 4.  Essential hypertension: controlled. 5. Hyperlipidemia: Continue statin. LDL in 11/15 optimal.  6. Biventricular ICD (implantable cardioverter-defibrillator) in place: FU with EP as planned.  7. Carotid stenosis, left - s/p L carotid stent in 2004:  [place] in 09/2014 with bilateral ICA 1-39% >> FU in 2 years. 8.  Pericardial Effusion:  Etiology not clear.  He is not symptomatic.      -  Arrange repeat limited echo to recheck effusion. 9.  Aortic Stenosis:  Probably mild to mod.  Gradient low in setting of low EF.  He is not symptomatic.  He will likely need a repeat echo in 1 year to monitor this. 10. [name]:  Repeat BMET today.  Current medicines are reviewed at length with the patient today.  The patient does not have concerns regarding medicines.  The following changes have been made:  As above.   Labs/ tests ordered today include:   Orders Placed This Encounter  Procedures  . Basic metabolic panel  . EKG 12-Lead  . 2D Echocardiogram with contrast     Disposition:   FU with Dr. Cristopher Peru  in 4 weeks.   Signed, [name], MHS 11/16/2014 3:07 PM    [name] [address], [place], [place]  [address] Phone: [phone]; Fax: [phone]    ADDENDUM 11/27/2014 1:41 PM: Echocardiogram 11/19/14 Study Conclusions - Left ventricle: The cavity size was normal. Systolic function was severely reduced. The estimated ejection fraction was in the range of 20% to 25%. There is akinesis of the midanteroseptal myocardium. There is akinesis of the apical myocardium. There is akinesis of the mid-apicalinferior myocardium. There is akinesis of the apicalanterior myocardium. There is akinesis of the apicallateral myocardium. - Pericardium, extracardiac: A moderate to large, free-flowing pericardial effusion was identified circumferential to the heart.  The fluid had no internal echoes. There was mildright ventricular chamber collapse for less than 50% of the  cardiac cycle.  Respirophasic change in stroke volume was normal.  Impressions: - Compared to study of 10/23/2013 the effusion is the same or slightly larger.  I have reviewed the Echo with Dr. Ena Dawley.  She has recommended that we proceed with diagnostic pericardiocentesis.  This has been arranged with Dr. Peter Martinique 11/30/14.  Fluid will need to be sent for cytology and culture.  I explained the procedure to the patient over the telephone.   Signed,  [name], MHS 11/27/2014 1:41 PM    [name] [address], [place], [place]  [address] Phone: [phone]; Fax: [phone]

## 2014-11-17 ENCOUNTER — Telehealth: Payer: Self-pay | Admitting: *Deleted

## 2014-11-17 LAB — BASIC METABOLIC PANEL
BUN: 19 mg/dL (ref 6–23)
CALCIUM: 9.9 mg/dL (ref 8.4–10.5)
CO2: 28 mEq/L (ref 19–32)
Chloride: 105 mEq/L (ref 96–112)
Creatinine, Ser: 1.42 mg/dL (ref 0.40–1.50)
GFR: 61.4 mL/min (ref >60.00)
Glucose, Bld: 58 mg/dL — ABNORMAL LOW (ref 70–99)
Potassium: 4.7 mEq/L (ref 3.5–5.1)
Sodium: 138 mEq/L (ref 135–145)

## 2014-11-17 NOTE — Telephone Encounter (Signed)
pt notified about lab results with verbal understanding. Pt confirmed appt for echo appt 2/25 1 pm.

## 2014-11-19 ENCOUNTER — Ambulatory Visit (HOSPITAL_COMMUNITY): Payer: Commercial Managed Care - HMO | Attending: Cardiology | Admitting: Radiology

## 2014-11-19 DIAGNOSIS — I10 Essential (primary) hypertension: Secondary | ICD-10-CM | POA: Insufficient documentation

## 2014-11-19 DIAGNOSIS — E785 Hyperlipidemia, unspecified: Secondary | ICD-10-CM | POA: Diagnosis not present

## 2014-11-19 DIAGNOSIS — I5022 Chronic systolic (congestive) heart failure: Secondary | ICD-10-CM

## 2014-11-19 DIAGNOSIS — E119 Type 2 diabetes mellitus without complications: Secondary | ICD-10-CM | POA: Diagnosis not present

## 2014-11-19 DIAGNOSIS — Z87891 Personal history of nicotine dependence: Secondary | ICD-10-CM | POA: Insufficient documentation

## 2014-11-19 DIAGNOSIS — I739 Peripheral vascular disease, unspecified: Secondary | ICD-10-CM | POA: Insufficient documentation

## 2014-11-19 DIAGNOSIS — I255 Ischemic cardiomyopathy: Secondary | ICD-10-CM

## 2014-11-19 DIAGNOSIS — I319 Disease of pericardium, unspecified: Secondary | ICD-10-CM | POA: Insufficient documentation

## 2014-11-19 DIAGNOSIS — I2510 Atherosclerotic heart disease of native coronary artery without angina pectoris: Secondary | ICD-10-CM

## 2014-11-19 DIAGNOSIS — I313 Pericardial effusion (noninflammatory): Secondary | ICD-10-CM

## 2014-11-19 DIAGNOSIS — I3139 Other pericardial effusion (noninflammatory): Secondary | ICD-10-CM

## 2014-11-19 NOTE — Progress Notes (Signed)
Limited Echocardiogram performed for Pericardial Effusion.  

## 2014-11-25 ENCOUNTER — Encounter: Payer: Self-pay | Admitting: *Deleted

## 2014-11-25 ENCOUNTER — Telehealth: Payer: Commercial Managed Care - HMO | Admitting: *Deleted

## 2014-11-25 ENCOUNTER — Other Ambulatory Visit (INDEPENDENT_AMBULATORY_CARE_PROVIDER_SITE_OTHER): Payer: Commercial Managed Care - HMO | Admitting: *Deleted

## 2014-11-25 DIAGNOSIS — I5022 Chronic systolic (congestive) heart failure: Secondary | ICD-10-CM

## 2014-11-25 DIAGNOSIS — I3139 Other pericardial effusion (noninflammatory): Secondary | ICD-10-CM

## 2014-11-25 DIAGNOSIS — I313 Pericardial effusion (noninflammatory): Secondary | ICD-10-CM

## 2014-11-25 DIAGNOSIS — I5020 Unspecified systolic (congestive) heart failure: Secondary | ICD-10-CM | POA: Diagnosis not present

## 2014-11-25 DIAGNOSIS — I319 Disease of pericardium, unspecified: Secondary | ICD-10-CM | POA: Diagnosis not present

## 2014-11-25 LAB — BASIC METABOLIC PANEL
BUN: 21 mg/dL (ref 6–23)
CALCIUM: 9.7 mg/dL (ref 8.4–10.5)
CO2: 33 mEq/L — ABNORMAL HIGH (ref 19–32)
CREATININE: 1.57 mg/dL — AB (ref 0.40–1.50)
Chloride: 104 mEq/L (ref 96–112)
GFR: 54.68 mL/min — AB (ref >60.00)
Glucose, Bld: 69 mg/dL — ABNORMAL LOW (ref 70–99)
Potassium: 4.3 mEq/L (ref 3.5–5.1)
Sodium: 141 mEq/L (ref 135–145)

## 2014-11-25 NOTE — Addendum Note (Signed)
Addended by: Michae Kava on: 11/25/2014 05:17 PM   Modules accepted: Orders

## 2014-11-25 NOTE — Telephone Encounter (Signed)
[name]. PA s/w pt and pt was advised of echo results and that he will need to have a pericardiocentesis to drain the fluid from around his heart. Lab work 3/2, Art gallery manager, Psychologist, occupational, pt/inr per PA. Pt aware and agreeable to plan of care. Pt aware I will go over instructions.

## 2014-11-25 NOTE — Addendum Note (Signed)
Addended by: Eulis Foster on: 11/25/2014 01:31 PM   Modules accepted: Orders

## 2014-11-25 NOTE — Telephone Encounter (Signed)
NOT ALL OF PT'S LABS WERE DRAWN TODAY; [name] WILL CALL THE PT TO SEE IF PT CAN COME IN TOMORROW TO HAVE CBC AND PT/INR DONE .

## 2014-11-26 ENCOUNTER — Telehealth: Payer: Self-pay

## 2014-11-26 ENCOUNTER — Other Ambulatory Visit: Payer: Commercial Managed Care - HMO

## 2014-11-26 NOTE — Telephone Encounter (Signed)
Called patient about having labs done for procedure on Monday. Patient unable to come in until Monday. Patient agreed to come in early Monday at the hospital short stay to have labs done. Called cath lab to inform them of situation.

## 2014-11-27 DIAGNOSIS — I313 Pericardial effusion (noninflammatory): Secondary | ICD-10-CM

## 2014-11-27 DIAGNOSIS — I3139 Other pericardial effusion (noninflammatory): Secondary | ICD-10-CM

## 2014-11-27 NOTE — Addendum Note (Signed)
Addended byKathlen Mody, Nicki Reaper T on: 11/27/2014 01:48 PM   Modules accepted: Orders

## 2014-11-27 NOTE — H&P (Signed)
History and Physical   Date:  11/16/2014   ID:  [name], DOB 1930/11/18, MRN [id number]  PCP:  Axel Filler, MD  Cardiologist/Electrophysiologist:  Dr. Cristopher Peru   Chief Complaint  Patient presents with  . Congestive Heart Failure    follow up  . Pericardial Effusion     History of Present Illness: [name] is a 81 y.o. [name] with a hx of CAD status post PCI to the circumflex in 2004 with BMS and subsequent PCI to the circumflex with a DES in 2005, ischemic cardiomyopathy with a prior EF of 15-20%, HTN, HL, DM 2, prior TIA, carotid stenosis status post left carotid stent 2004, [name]. Echo in 2006 demonstrated an EF of 30-40%. He is s/p ICD implantation. This was upgraded to a CRT-D device in 12/2012.   Admitted [id number] with a/c systolic CHF.   I saw him in FU 09/01/14.  FU echo was arranged and I tried to get him into the ICM clinic.  It has been difficult to reach him by phone to set up ICM clinic FU.  He continues to feel well.  The patient denies chest pain, shortness of breath, syncope, orthopnea, PND or significant pedal edema.  He is NYHA 2-2b.  Denies ICD discharges.   Studies:  - LHC (5/05): Proximal LAD 100% with bridging collaterals (CTO), proximal circumflex 20%, mid circumflex 95% ISR, proximal-mid RCA 75%, EF 20% >>PCI: 3.0 x 28 mm Cypher DES to the mid circumflex - Echo (7/06): EF 30-40%, mildly reduced RV systolic function  Echocardiogram 10/23/14 - EF 25%, distal anterior, distal inferior, distal lateral and apical akinesis, grade 1 diastolic dysfunction - Mild aortic stenosis (peak 11 mmHg, mean 7 mmHg), may be low due to depressed LVEF >> 2-D images suggest mild to moderate aortic stenosis - Large pericardial effusion-no RV/RA collapse   Past Medical History  Diagnosis Date  . Anemia   . Cataracts, bilateral   . Chronic systolic CHF (congestive heart failure)     a. ischemic CM EF 15-20%;  b. s/p AICD 05/24/04;  c. Echo 7/06: EF  30-40%, mild reduced RVSF  . CAD (coronary artery disease)     a. s/p AMI, s/p PTCA & stent of cx 12/04;  b. LHC (5/05): Proximal LAD 100% with bridging collaterals (CTO), proximal circumflex 20%, mid circumflex 95% ISR, proximal-mid RCA 75%, EF 20% >>PCI: 3.0 x 28 mm Cypher DES to the mid CFX  . Diverticulosis of colon   . Hyperlipidemia   . PVD (peripheral vascular disease)     s/p L carotid PTCA/stent 2004  . Transient ischemic attack   . Erectile dysfunction   . Hyperkalemia 08/2008    K=5.7   . BBB (bundle branch block)     right  . HTN (hypertension)   . Elevated PSA   . Adenomatous colon polyp 02/14/2012  . ICD (implantable cardiac defibrillator) in place 12-25-2012    MDT CRTD upgrade by Dr Lovena Le  . Pacemaker   . Type II diabetes mellitus   . Myocardial infarction 1990  . Arthritis     "right hip" (12/25/2012)  . Carotid stenosis     a. s/p L carotid stent 2004;  b. Carotid US (09/2014): Bilateral ICA 1-39%, left ECA >59%, normal subclavian bilaterally, occluded left vertebral >> FU 2 years  . Pericardial effusion     Echocardiogram (09/2014): EF 25% with distal anterior, distal inferior, distal lateral and apical akinesis, grade 1 diastolic dysfunction, very mild aortic  stenosis (mean 7 mmHg) - this may be depressed due to low EF (2-D images suggest mild to moderate aortic stenosis), large pericardial effusion, no RA collapse    Past Surgical History  Procedure Laterality Date  . Cardiac defibrillator placement  05/24/2004    Implantation of a MDT single-chamber defibrillator  . Carotid stent  09/11/2003    Percutaneous transluminal angioplasty and stent placement of the left internal carotid artery.  . Biv icd upgrade  12/25/2012    MDT CRTD upgrade by Dr Lovena Le for ischemic cardiomyopathy and worsening conduction system disease  . Cataract extraction w/ intraocular lens  implant, bilateral Bilateral ~ 2010  . Cardiac catheterization  06/2003,  01/2004  . Coronary angioplasty  with stent placement  1990    "2" (12/25/2012)  . Bi-ventricular implantable cardioverter defibrillator upgrade N/A 12/25/2012    Procedure: BI-VENTRICULAR IMPLANTABLE CARDIOVERTER DEFIBRILLATOR UPGRADE;  Surgeon: Evans Lance, MD;  Location: [facility] CATH LAB;  Service: Cardiovascular;  Laterality: N/A;  . Lead revision N/A 12/25/2012    Procedure: LEAD REVISION;  Surgeon: Evans Lance, MD;  Location: [facility] CATH LAB;  Service: Cardiovascular;  Laterality: N/A;     Current Outpatient Prescriptions  Medication Sig Dispense Refill  . albuterol (PROVENTIL HFA;VENTOLIN HFA) 108 (90 BASE) MCG/ACT inhaler Inhale 1-2 puffs into the lungs every 4 (four) hours as needed for wheezing or shortness of breath. 1 Inhaler 0  . aspirin 81 MG tablet Take 81 mg by mouth daily.     . carvedilol (COREG) 25 MG tablet Take 1 tablet (25 mg total) by mouth 2 (two) times daily with a meal. 60 tablet 3  . furosemide (LASIX) 20 MG tablet Take 1 tablet (20 mg total) by mouth daily. 30 tablet 1  . Insulin Glargine (LANTUS SOLOSTAR) 100 UNIT/ML Solostar Pen Inject 44 Units into the skin at bedtime. 15 mL 6  . Insulin Pen Needle (B-D ULTRAFINE III SHORT PEN) 31G X 8 MM MISC Use as directed for once daily insulin injection. 100 each 3  . lisinopril (PRINIVIL,ZESTRIL) 5 MG tablet Take 1 tablet (5 mg total) by mouth daily. 30 tablet 11  . pantoprazole (PROTONIX) 40 MG tablet Take 1 tablet (40 mg total) by mouth daily. 30 tablet 5  . simvastatin (ZOCOR) 40 MG tablet Take 1 tablet (40 mg total) by mouth daily. 30 tablet 5   No current facility-administered medications for this visit.    Allergies:   Review of patient's allergies indicates no known allergies.    Social History:  The patient  reports that he quit smoking about 46 years ago. His smoking use included Cigarettes. He has never used smokeless tobacco. He reports that he does not drink alcohol or use illicit drugs.   Family History:  The patient's family history includes  Diabetes in his brother and mother. There is no history of Heart attack or Stroke.    ROS:   Please see the history of present illness.   Review of Systems  Constitution: Negative for fever.  Respiratory: Negative for cough.   Gastrointestinal: Negative for hematochezia, melena, nausea and vomiting.  Genitourinary: Negative for hematuria.  All other systems reviewed and are negative.   PHYSICAL EXAM: VS:  BP 120/70 mmHg  Pulse 50  Ht 5\' 10"  (1.778 m)  Wt 186 lb (84.369 kg)  BMI 26.69 kg/m2    Wt Readings from Last 3 Encounters:  11/16/14 186 lb (84.369 kg)  09/01/14 187 lb (84.823 kg)  08/29/14 175 lb 9.6  oz (79.652 kg)     GEN: Well nourished, well developed, in no acute distress HEENT: normal Neck: no JVD, no masses Cardiac:  Normal 99991111, RRR; 1/6 systolic murmur RUSB, no rubs or gallops, no edema  Respiratory:  clear to auscultation bilaterally, no wheezing, rhonchi or rales. GI: soft, nontender, nondistended, + BS MS: no deformity or atrophy Skin: warm and dry  Neuro:  CNs II-XII intact, Strength and sensation are intact Psych: Normal affect   EKG:  EKG is ordered today.  It demonstrates:   V paced, HR 50   Recent Labs: 07/29/2014: ALT 12 08/28/2014: Hemoglobin 14.9; Platelets 169 09/01/2014: Pro B Natriuretic peptide (BNP) 219.0* 10/13/2014: BUN 42*; Creatinine 1.79*; Potassium 4.2; Sodium 137    Lipid Panel    Component Value Date/Time   CHOL 127 07/29/2014 1102   TRIG 63 07/29/2014 1102   HDL 45 07/29/2014 1102   CHOLHDL 2.8 07/29/2014 1102   VLDL 13 07/29/2014 1102   LDLCALC 69 07/29/2014 1102      ASSESSMENT AND PLAN:  1.  Chronic systolic CHF (congestive heart failure): Volume stable.  He tells me he feels great and is NYHA 2-2b.  He is out of his Lasix.  This will be refilled.    -  BMET today. 2. Ischemic cardiomyopathy: Continue beta blocker and ACEI. 3. Coronary artery disease: No angina. Continue aspirin, beta blocker, statin. 4.  Essential hypertension: controlled. 5. Hyperlipidemia: Continue statin. LDL in 11/15 optimal.  6. Biventricular ICD (implantable cardioverter-defibrillator) in place: FU with EP as planned.  7. Carotid stenosis, left - s/p L carotid stent in 2004:  [place] in 09/2014 with bilateral ICA 1-39% >> FU in 2 years. 8.  Pericardial Effusion:  Etiology not clear.  He is not symptomatic.      -  Arrange repeat limited echo to recheck effusion. 9.  Aortic Stenosis:  Probably mild to mod.  Gradient low in setting of low EF.  He is not symptomatic.  He will likely need a repeat echo in 1 year to monitor this. 10. [name]:  Repeat BMET today.  Current medicines are reviewed at length with the patient today.  The patient does not have concerns regarding medicines.  The following changes have been made:  As above.   Labs/ tests ordered today include:   Orders Placed This Encounter  Procedures  . Basic metabolic panel  . EKG 12-Lead  . 2D Echocardiogram with contrast     Disposition:   FU with Dr. Cristopher Peru  in 4 weeks.   Signed, Versie Starks, MHS 11/16/2014 3:07 PM    [facility] [address], [place], [place]  [address] Phone: [phone]; Fax: [phone]    ADDENDUM 11/27/2014 1:41 PM: Echocardiogram 11/19/14 Study Conclusions - Left ventricle: The cavity size was normal. Systolic function was severely reduced. The estimated ejection fraction was in the range of 20% to 25%. There is akinesis of the midanteroseptal myocardium. There is akinesis of the apical myocardium. There is akinesis of the mid-apicalinferior myocardium. There is akinesis of the apicalanterior myocardium. There is akinesis of the apicallateral myocardium. - Pericardium, extracardiac: A moderate to large, free-flowing pericardial effusion was identified circumferential to the heart.  The fluid had no internal echoes. There was mildright ventricular chamber collapse for less than 50% of the  cardiac cycle.  Respirophasic change in stroke volume was normal.  Impressions: - Compared to study of 10/23/2013 the effusion is the same or slightly larger.  I have reviewed the Echo with Dr. Ena Dawley.  She has recommended that we proceed with diagnostic pericardiocentesis.  This has been arranged with Dr. Peter Martinique 11/30/14.  Fluid will need to be sent for cytology and culture.  I explained the procedure to the patient over the telephone.   Signed,  Versie Starks, MHS 11/27/2014 1:41 PM    [facility] [place], [place], [place]  [address] Phone: [phone]; Fax: [phone]

## 2014-11-30 ENCOUNTER — Other Ambulatory Visit: Payer: Self-pay | Admitting: Cardiology

## 2014-11-30 ENCOUNTER — Ambulatory Visit (HOSPITAL_COMMUNITY)
Admission: RE | Admit: 2014-11-30 | Discharge: 2014-11-30 | Disposition: A | Discharge status: Home / Self Care | Payer: Commercial Managed Care - HMO | Source: Ambulatory Visit | Attending: Cardiology | Admitting: Cardiology

## 2014-11-30 ENCOUNTER — Encounter (HOSPITAL_COMMUNITY)
Admission: RE | Disposition: A | Discharge status: Home / Self Care | Payer: Self-pay | Source: Ambulatory Visit | Attending: Cardiology

## 2014-11-30 DIAGNOSIS — I255 Ischemic cardiomyopathy: Secondary | ICD-10-CM | POA: Diagnosis not present

## 2014-11-30 DIAGNOSIS — Z95810 Presence of automatic (implantable) cardiac defibrillator: Secondary | ICD-10-CM | POA: Diagnosis not present

## 2014-11-30 DIAGNOSIS — I3139 Other pericardial effusion (noninflammatory): Secondary | ICD-10-CM

## 2014-11-30 DIAGNOSIS — Z794 Long term (current) use of insulin: Principal | ICD-10-CM

## 2014-11-30 DIAGNOSIS — K5790 Diverticulosis of intestine, part unspecified, without perforation or abscess without bleeding: Secondary | ICD-10-CM | POA: Insufficient documentation

## 2014-11-30 DIAGNOSIS — Z7982 Long term (current) use of aspirin: Secondary | ICD-10-CM | POA: Insufficient documentation

## 2014-11-30 DIAGNOSIS — Z95820 Peripheral vascular angioplasty status with implants and grafts: Secondary | ICD-10-CM | POA: Diagnosis not present

## 2014-11-30 DIAGNOSIS — Z5309 Procedure and treatment not carried out because of other contraindication: Secondary | ICD-10-CM | POA: Insufficient documentation

## 2014-11-30 DIAGNOSIS — I252 Old myocardial infarction: Secondary | ICD-10-CM | POA: Insufficient documentation

## 2014-11-30 DIAGNOSIS — I350 Nonrheumatic aortic (valve) stenosis: Secondary | ICD-10-CM | POA: Diagnosis not present

## 2014-11-30 DIAGNOSIS — Z87891 Personal history of nicotine dependence: Secondary | ICD-10-CM | POA: Insufficient documentation

## 2014-11-30 DIAGNOSIS — I313 Pericardial effusion (noninflammatory): Secondary | ICD-10-CM | POA: Insufficient documentation

## 2014-11-30 DIAGNOSIS — I2510 Atherosclerotic heart disease of native coronary artery without angina pectoris: Secondary | ICD-10-CM | POA: Insufficient documentation

## 2014-11-30 DIAGNOSIS — E785 Hyperlipidemia, unspecified: Secondary | ICD-10-CM | POA: Insufficient documentation

## 2014-11-30 DIAGNOSIS — Z955 Presence of coronary angioplasty implant and graft: Secondary | ICD-10-CM | POA: Insufficient documentation

## 2014-11-30 DIAGNOSIS — E119 Type 2 diabetes mellitus without complications: Secondary | ICD-10-CM | POA: Insufficient documentation

## 2014-11-30 DIAGNOSIS — I5022 Chronic systolic (congestive) heart failure: Secondary | ICD-10-CM | POA: Diagnosis not present

## 2014-11-30 DIAGNOSIS — N189 Chronic kidney disease, unspecified: Secondary | ICD-10-CM | POA: Insufficient documentation

## 2014-11-30 DIAGNOSIS — I129 Hypertensive chronic kidney disease with stage 1 through stage 4 chronic kidney disease, or unspecified chronic kidney disease: Secondary | ICD-10-CM | POA: Insufficient documentation

## 2014-11-30 DIAGNOSIS — IMO0001 Reserved for inherently not codable concepts without codable children: Secondary | ICD-10-CM

## 2014-11-30 DIAGNOSIS — I6523 Occlusion and stenosis of bilateral carotid arteries: Secondary | ICD-10-CM | POA: Diagnosis not present

## 2014-11-30 DIAGNOSIS — Z8673 Personal history of transient ischemic attack (TIA), and cerebral infarction without residual deficits: Secondary | ICD-10-CM | POA: Diagnosis not present

## 2014-11-30 DIAGNOSIS — I319 Disease of pericardium, unspecified: Secondary | ICD-10-CM

## 2014-11-30 LAB — CBC
HCT: 43.4 % (ref 39.0–52.0)
Hemoglobin: 14.1 g/dL (ref 13.0–17.0)
MCH: 30.1 pg (ref 26.0–34.0)
MCHC: 32.5 g/dL (ref 30.0–36.0)
MCV: 92.5 fL (ref 78.0–100.0)
PLATELETS: 156 10*3/uL (ref 150–400)
RBC: 4.69 MIL/uL (ref 4.22–5.81)
RDW: 14 % (ref 11.5–15.5)
WBC: 6.6 10*3/uL (ref 4.0–10.5)

## 2014-11-30 LAB — PROTIME-INR
INR: 1.22 (ref 0.00–1.49)
Prothrombin Time: 15.6 seconds — ABNORMAL HIGH (ref 11.6–15.2)

## 2014-11-30 LAB — GLUCOSE, CAPILLARY: GLUCOSE-CAPILLARY: 113 mg/dL — AB (ref 70–99)

## 2014-11-30 SURGERY — PERICARDIAL TAP

## 2014-11-30 MED ORDER — SODIUM CHLORIDE 0.9 % IV SOLN
INTRAVENOUS | Status: DC
  Administered 2014-11-30: 07:00:00 via INTRAVENOUS

## 2014-11-30 MED ORDER — INSULIN GLARGINE 100 UNIT/ML SOLOSTAR PEN: 44.0000 [IU] | PEN_INJECTOR | Freq: Every day | SUBCUTANEOUS | Status: DC

## 2014-11-30 MED ORDER — SODIUM CHLORIDE 0.9 % IV SOLN: 250.0000 mL | INTRAVENOUS | Status: DC | PRN

## 2014-11-30 MED ORDER — SODIUM CHLORIDE 0.9 % IJ SOLN: 3.0000 mL | INTRAMUSCULAR | Status: DC | PRN

## 2014-11-30 MED ORDER — FUROSEMIDE 40 MG PO TABS: 40.0000 mg | ORAL_TABLET | Freq: Two times a day (BID) | ORAL | Status: DC

## 2014-11-30 MED ORDER — ASPIRIN 81 MG PO CHEW: 81.0000 mg | CHEWABLE_TABLET | ORAL | Status: DC

## 2014-11-30 MED ORDER — SODIUM CHLORIDE 0.9 % IJ SOLN: 3.0000 mL | Freq: Two times a day (BID) | INTRAMUSCULAR | Status: DC

## 2014-11-30 NOTE — Progress Notes (Signed)
Interventional cardiology:  Patient scheduled for diagnostic pericardiocentesis today as an outpatient.   History and Echo findings reviewed personally and patient examined.   He is hypertensive. HR is normal. He has mild JVD and clear lungs. Positive S3 2+ edema.   Echo shows a moderately large pericardial effusion localized to the posterolateral pericardium. There are no Echo findings of tamponade.   The patient is completely asymptomatic from his pericardial effusion. He has no chest pain, orthopnea, or PND. He has no clinical or Echo findings of tamponade. He has chronic class 2 CHF symptoms. His Echo was recently obtained for CHF and effusion was noted. Repeat suggested this was larger. The patient is volume overloaded.   I think that his effusion is most likely related to his chronic CHF. The duration of his effusion is unknown. I think a pericardiocentesis would be difficult due to posterolateral location of effusion and I think diagnostic information obtained from a tap would be limited. Without evidence of tamponade I feel the risk associated with a diagnostic tap is not clinically supported.   I recommend increasing diuresis with lasix to 40 mg bid. Will arrange follow up in 2 weeks. Would follow Echo serially.   Rafaelita Foister Martinique MD

## 2014-12-02 ENCOUNTER — Telehealth: Payer: Self-pay | Admitting: Internal Medicine

## 2014-12-02 NOTE — Telephone Encounter (Signed)
Call to patient to confirm appointment for 12/03/14 at 9:15. [email or web address]

## 2014-12-03 ENCOUNTER — Encounter: Payer: Self-pay | Admitting: Internal Medicine

## 2014-12-03 ENCOUNTER — Ambulatory Visit (INDEPENDENT_AMBULATORY_CARE_PROVIDER_SITE_OTHER): Payer: Commercial Managed Care - HMO | Admitting: Internal Medicine

## 2014-12-03 VITALS — BP 119/62 | HR 60 | Temp 98.1°F | Wt 185.5 lb

## 2014-12-03 DIAGNOSIS — Z23 Encounter for immunization: Secondary | ICD-10-CM

## 2014-12-03 DIAGNOSIS — I313 Pericardial effusion (noninflammatory): Secondary | ICD-10-CM

## 2014-12-03 DIAGNOSIS — I5022 Chronic systolic (congestive) heart failure: Secondary | ICD-10-CM | POA: Diagnosis not present

## 2014-12-03 DIAGNOSIS — M25551 Pain in right hip: Secondary | ICD-10-CM

## 2014-12-03 DIAGNOSIS — R972 Elevated prostate specific antigen [PSA]: Secondary | ICD-10-CM | POA: Diagnosis not present

## 2014-12-03 DIAGNOSIS — E119 Type 2 diabetes mellitus without complications: Secondary | ICD-10-CM

## 2014-12-03 DIAGNOSIS — E785 Hyperlipidemia, unspecified: Secondary | ICD-10-CM | POA: Diagnosis not present

## 2014-12-03 DIAGNOSIS — I509 Heart failure, unspecified: Secondary | ICD-10-CM | POA: Diagnosis not present

## 2014-12-03 DIAGNOSIS — I10 Essential (primary) hypertension: Secondary | ICD-10-CM | POA: Diagnosis not present

## 2014-12-03 DIAGNOSIS — N3941 Urge incontinence: Secondary | ICD-10-CM

## 2014-12-03 DIAGNOSIS — I3139 Other pericardial effusion (noninflammatory): Secondary | ICD-10-CM

## 2014-12-03 LAB — COMPLETE METABOLIC PANEL WITH GFR
ALBUMIN: 3.7 g/dL (ref 3.5–5.2)
ALT: 9 U/L (ref 0–53)
AST: 16 U/L (ref 0–37)
Alkaline Phosphatase: 64 U/L (ref 39–117)
BUN: 25 mg/dL — ABNORMAL HIGH (ref 6–23)
CALCIUM: 9.7 mg/dL (ref 8.4–10.5)
CO2: 26 meq/L (ref 19–32)
CREATININE: 1.54 mg/dL — AB (ref 0.50–1.35)
Chloride: 102 mEq/L (ref 96–112)
GFR, EST NON AFRICAN AMERICAN: 42 mL/min — AB
GFR, Est African American: 48 mL/min — ABNORMAL LOW
GLUCOSE: 100 mg/dL — AB (ref 70–99)
POTASSIUM: 4.3 meq/L (ref 3.5–5.3)
Sodium: 139 mEq/L (ref 135–145)
Total Bilirubin: 0.5 mg/dL (ref 0.2–1.2)
Total Protein: 7 g/dL (ref 6.0–8.3)

## 2014-12-03 LAB — POCT GLYCOSYLATED HEMOGLOBIN (HGB A1C): HEMOGLOBIN A1C: 7.2

## 2014-12-03 LAB — GLUCOSE, CAPILLARY: Glucose-Capillary: 129 mg/dL — ABNORMAL HIGH (ref 70–99)

## 2014-12-03 MED ORDER — ALBUTEROL SULFATE HFA 108 (90 BASE) MCG/ACT IN AERS: 1.0000 | INHALATION_SPRAY | RESPIRATORY_TRACT | Status: DC | PRN

## 2014-12-03 MED ORDER — INSULIN GLARGINE 100 UNIT/ML SOLOSTAR PEN: 46.0000 [IU] | PEN_INJECTOR | Freq: Every day | SUBCUTANEOUS | Status: DC

## 2014-12-03 NOTE — Patient Instructions (Signed)
Increase Lantus insulin to a dose of 46 units daily at bedtime. A referral has been made to a urologist for evaluation of your urinary incontinence. Please schedule an appointment for colonoscopy to follow-up your history of adenomatous polyp.

## 2014-12-03 NOTE — Assessment & Plan Note (Addendum)
Lab Results  Component Value Date   PSA 6.82* 02/19/2013   PSA 7.30* 02/14/2012   PSA 5.53* 03/10/2009     Assessment: Patient has a history of mildly elevated PSA, and has been followed by his urologist Dr. Karsten Ro.  Plan: Check a PSA today.  I advised patient to follow-up with Dr. Karsten Ro.

## 2014-12-03 NOTE — Assessment & Plan Note (Signed)
Wt Readings from Last 3 Encounters:  12/03/14 185 lb 8 oz (84.142 kg)  11/30/14 198 lb (89.812 kg)  11/16/14 186 lb (84.369 kg)    Assessment: Patient has no dyspnea, orthopnea, or PND, on furosemide 40 mg twice a day, lisinopril 5 mg daily, and carvedilol 25 mg twice a day.  He is followed by cardiologist Dr. Lovena Le, and has a follow-up appointment on 12/15/2014.  He does have some persisting leg edema, but his furosemide dose was increased just last week.  He has a large pericardial effusion that is being followed by cardiology as well.   Plan: Continue current medications; follow-up with Dr. Lovena Le as scheduled.

## 2014-12-03 NOTE — Assessment & Plan Note (Signed)
Assessment: Patient was found to have a pericardial effusion on 2-D echocardiogram in January, and pericardiocentesis was planned.  However, on 11/30/2014 Dr. Martinique felt that the potential risk of pericardiocentesis outweighed the potential benefit due to the posterolateral location of the effusion; he recommended increasing diuresis with Lasix to 40 mg twice a day with follow-up in 2 weeks.    Plan: Continue furosemide at current dose; follow-up with cardiology as scheduled.

## 2014-12-03 NOTE — Assessment & Plan Note (Signed)
Lipids:    Component Value Date/Time   CHOL 127 07/29/2014 1102   TRIG 63 07/29/2014 1102   HDL 45 07/29/2014 1102   LDLCALC 69 07/29/2014 1102   VLDL 13 07/29/2014 1102   CHOLHDL 2.8 07/29/2014 1102    Assessment: LDL is at goal on simvastatin 40 mg daily.  Patient has no apparent side effects.  Plan: Continue simvastatin 40 mg daily.

## 2014-12-03 NOTE — Assessment & Plan Note (Addendum)
Lab Results  Component Value Date   HGBA1C 7.2 12/03/2014   HGBA1C 7.8 07/29/2014   HGBA1C 9.4 12/18/2013     Assessment: Diabetes control: fair control Progress toward A1C goal:  improved Comments: Patient's hemoglobin A1c is near goal on Lantus insulin 44 units daily at bedtime.  He denies any low blood sugars.  Plan: Medications:  Increase Lantus insulin to a dose of 46 units daily at bedtime Home glucose monitoring: Frequency: 2 times a day Timing: before meals Instruction/counseling given: reminded to bring blood glucose meter & log to each visit and reminded to bring medications to each visit Educational resources provided: brochure

## 2014-12-03 NOTE — Assessment & Plan Note (Signed)
BP Readings from Last 3 Encounters:  12/03/14 119/62  11/30/14 193/78  11/16/14 120/70    Lab Results  Component Value Date   NA 141 11/25/2014   K 4.3 11/25/2014   CREATININE 1.57* 11/25/2014    Assessment: Blood pressure control: controlled Progress toward BP goal:  at goal Comments: Blood pressure is controlled on carvedilol 25 mg twice a day, furosemide 40 mg twice a day, and lisinopril 5 mg daily.  Plan: Medications:  continue current medications Educational resources provided: brochure

## 2014-12-03 NOTE — Assessment & Plan Note (Signed)
Assessment: Patient has chronic right hip pain aggravated by standing or walking.  Hip x-ray in November 2015 showed only mild hip joint degeneration, but did also show widespread osteophytosis of the lumbar spine which raises the question of diffuse idiopathic skeletal hyperostosis (DISH).  As previously noted, given the relatively unremarkable right hip x-ray, it is possible that his pain is referred from his lumbosacral spine disease.    Plan: Patient has been taking no analgesics for the pain.  As before, I recommended occasional PRN use of acetaminophen at over-the-counter doses.  As previously noted, given his AICD, we cannot do an MRI of his lumbosacral spine; per previous discussion with radiology, a noncontrasted CT scan would be the best option to further image his spine if needed.  Patient's pain is currently not severe, and he has no neurologic symptoms.  Plan is to treat conservatively with acetaminophen; and consider further imaging with a noncontrasted CT scan if his symptoms worsen.

## 2014-12-03 NOTE — Assessment & Plan Note (Signed)
Assessment: Patient has urge incontinence of urine dating back to 2012; at that time, I referred him to urology for evaluation.  He was seen in August 2012 by Dr. Karsten Ro.  He is on no treatment for the urge incontinence.  His recent increased furosemide dose has aggravated his symptoms.  Plan: Send a urinalysis and urine culture today.  Refer to urology for evaluation.

## 2014-12-03 NOTE — Progress Notes (Signed)
   Subjective:    Patient ID: RAEL PHI, male    DOB: [birth year], 81 y.o.   MRN: [id number]  HPI Patient returns for management of his type 2 diabetes mellitus, congestive heart failure, hyperlipidemia, hypertension, and other chronic medical problems.  He was recently diagnosed with a pericardial effusion and pericardiocentesis was planned.  However, on 11/30/2014 Dr. Martinique felt that the potential risk of pericardiocentesis outweighed the potential benefit due to the posterolateral location of the effusion; he recommended increasing diuresis with Lasix to 40 mg twice a day with follow-up in 2 weeks.  Patient does report increased diuresis, and has recently had problems with urge incontinence; this has been a chronic problem but is aggravated by the higher dose of Lasix.  He reports improvement in his leg edema on the higher dose of Lasix.  He denies any dyspnea, orthopnea, or PND.  He reports that he is compliant with his medications.  Patient did not bring his glucose meter to clinic today.   Review of Systems  Respiratory: Negative for shortness of breath.   Cardiovascular: Positive for leg swelling. Negative for chest pain.  Gastrointestinal: Negative for nausea, vomiting, abdominal pain, blood in stool and anal bleeding.  Genitourinary: Negative for dysuria.  Musculoskeletal: Positive for arthralgias (Right hip pain, chronic).  Neurological: Negative for dizziness and syncope.    I reviewed and updated the medication list, allergies, past medical history, past surgical history, family history, and social history.      Objective:   Physical Exam  Constitutional: No distress.  Cardiovascular: Normal rate and regular rhythm.  Exam reveals no gallop and no friction rub.   No murmur heard. 2+ bilateral pitting leg edema  Pulmonary/Chest: Effort normal and breath sounds normal. No respiratory distress. He has no wheezes. He has no rales.  Abdominal: Soft. Bowel sounds are normal. He  exhibits no distension. There is no tenderness. There is no rebound and no guarding.        Assessment & Plan:

## 2014-12-04 ENCOUNTER — Encounter: Payer: Self-pay | Admitting: Internal Medicine

## 2014-12-04 DIAGNOSIS — N183 Chronic kidney disease, stage 3 unspecified: Secondary | ICD-10-CM | POA: Insufficient documentation

## 2014-12-04 LAB — URINALYSIS, ROUTINE W REFLEX MICROSCOPIC
Bilirubin Urine: NEGATIVE
Glucose, UA: NEGATIVE mg/dL
HGB URINE DIPSTICK: NEGATIVE
Ketones, ur: NEGATIVE mg/dL
LEUKOCYTES UA: NEGATIVE
NITRITE: NEGATIVE
Protein, ur: NEGATIVE mg/dL
SPECIFIC GRAVITY, URINE: 1.009 (ref 1.005–1.030)
UROBILINOGEN UA: 0.2 mg/dL (ref 0.0–1.0)
pH: 5.5 (ref 5.0–8.0)

## 2014-12-04 LAB — URINE CULTURE: Colony Count: 70000

## 2014-12-04 LAB — PSA: PSA: 8.62 ng/mL — ABNORMAL HIGH (ref <=4.00)

## 2014-12-04 NOTE — Progress Notes (Signed)
Quick Note:  Creatinine shows stable chronic kidney disease. ______

## 2014-12-11 ENCOUNTER — Encounter: Payer: Self-pay | Admitting: *Deleted

## 2014-12-15 ENCOUNTER — Encounter: Payer: Self-pay | Admitting: Internal Medicine

## 2014-12-15 ENCOUNTER — Ambulatory Visit (INDEPENDENT_AMBULATORY_CARE_PROVIDER_SITE_OTHER): Payer: Commercial Managed Care - HMO | Admitting: Internal Medicine

## 2014-12-15 ENCOUNTER — Other Ambulatory Visit: Payer: Self-pay

## 2014-12-15 VITALS — BP 122/50 | HR 58 | Ht 70.0 in | Wt 183.4 lb

## 2014-12-15 DIAGNOSIS — I255 Ischemic cardiomyopathy: Secondary | ICD-10-CM | POA: Diagnosis not present

## 2014-12-15 DIAGNOSIS — Z95810 Presence of automatic (implantable) cardiac defibrillator: Secondary | ICD-10-CM | POA: Diagnosis not present

## 2014-12-15 DIAGNOSIS — I313 Pericardial effusion (noninflammatory): Secondary | ICD-10-CM

## 2014-12-15 DIAGNOSIS — I5022 Chronic systolic (congestive) heart failure: Secondary | ICD-10-CM

## 2014-12-15 DIAGNOSIS — I319 Disease of pericardium, unspecified: Secondary | ICD-10-CM | POA: Diagnosis not present

## 2014-12-15 DIAGNOSIS — I3139 Other pericardial effusion (noninflammatory): Secondary | ICD-10-CM

## 2014-12-15 LAB — MDC_IDC_ENUM_SESS_TYPE_INCLINIC
Battery Remaining Longevity: 81 mo
Brady Statistic AP VP Percent: 35.45 %
Brady Statistic AP VS Percent: 0.01 %
Brady Statistic AS VS Percent: 0.04 %
Brady Statistic RA Percent Paced: 35.46 %
Brady Statistic RV Percent Paced: 99.38 %
Date Time Interrogation Session: 20160322134537
HIGH POWER IMPEDANCE MEASURED VALUE: 190 Ohm
HIGH POWER IMPEDANCE MEASURED VALUE: 55 Ohm
Lead Channel Impedance Value: 475 Ohm
Lead Channel Pacing Threshold Amplitude: 0.75 V
Lead Channel Pacing Threshold Amplitude: 0.75 V
Lead Channel Pacing Threshold Pulse Width: 0.4 ms
Lead Channel Pacing Threshold Pulse Width: 0.6 ms
Lead Channel Sensing Intrinsic Amplitude: 2.125 mV
Lead Channel Sensing Intrinsic Amplitude: 23.75 mV
Lead Channel Setting Pacing Amplitude: 1.25 V
Lead Channel Setting Pacing Amplitude: 2.5 V
Lead Channel Setting Pacing Pulse Width: 0.4 ms
MDC IDC MSMT BATTERY VOLTAGE: 2.99 V
MDC IDC MSMT LEADCHNL LV IMPEDANCE VALUE: 342 Ohm
MDC IDC MSMT LEADCHNL LV IMPEDANCE VALUE: 551 Ohm
MDC IDC MSMT LEADCHNL LV IMPEDANCE VALUE: 760 Ohm
MDC IDC MSMT LEADCHNL RA IMPEDANCE VALUE: 456 Ohm
MDC IDC MSMT LEADCHNL RV PACING THRESHOLD AMPLITUDE: 0.875 V
MDC IDC MSMT LEADCHNL RV PACING THRESHOLD PULSEWIDTH: 0.4 ms
MDC IDC MSMT LEADCHNL RV SENSING INTR AMPL: 23.75 mV
MDC IDC SET LEADCHNL LV PACING PULSEWIDTH: 0.6 ms
MDC IDC SET LEADCHNL RA PACING AMPLITUDE: 2 V
MDC IDC SET LEADCHNL RV SENSING SENSITIVITY: 0.3 mV
MDC IDC SET ZONE DETECTION INTERVAL: 300 ms
MDC IDC SET ZONE DETECTION INTERVAL: 350 ms
MDC IDC SET ZONE DETECTION INTERVAL: 360 ms
MDC IDC STAT BRADY AS VP PERCENT: 64.5 %
Zone Setting Detection Interval: 250 ms
Zone Setting Detection Interval: 450 ms

## 2014-12-15 NOTE — Progress Notes (Signed)
HPI [name] returns today for followup. He is a pleasant 81 yo man with a h/o an ischemic cardiomyopathy, chronic systolic heart failure, ejection fraction 20%, complete heart block, status post biventricular ICD. In the interim, the patient has been stable. He underwent ICD generator removal and upgrade to a biventricular device over a year ago. He denies chest pain, syncope, or ICD shock. He has class II congestive heart failure. He has mild peripheral edema. He admits to dietary indiscretion. He was found to have a pericardial effusion of uncertain etiology. This has been followed conservatively. No Known Allergies   Current Outpatient Prescriptions  Medication Sig Dispense Refill  . albuterol (PROVENTIL HFA;VENTOLIN HFA) 108 (90 BASE) MCG/ACT inhaler Inhale 1-2 puffs into the lungs every 4 (four) hours as needed for wheezing or shortness of breath. 1 Inhaler 0  . aspirin EC 81 MG tablet Take 81 mg by mouth daily.    . carvedilol (COREG) 25 MG tablet Take 1 tablet (25 mg total) by mouth 2 (two) times daily with a meal. 60 tablet 3  . furosemide (LASIX) 40 MG tablet Take 1 tablet (40 mg total) by mouth 2 (two) times daily. 180 tablet 3  . Insulin Glargine (LANTUS SOLOSTAR) 100 UNIT/ML Solostar Pen Inject 46 Units into the skin at bedtime.    . Insulin Pen Needle (B-D ULTRAFINE III SHORT PEN) 31G X 8 MM MISC Use as directed for once daily insulin injection. 100 each 3  . lisinopril (PRINIVIL,ZESTRIL) 5 MG tablet Take 1 tablet (5 mg total) by mouth daily. 30 tablet 5  . pantoprazole (PROTONIX) 40 MG tablet Take 1 tablet (40 mg total) by mouth daily. 30 tablet 5  . simvastatin (ZOCOR) 40 MG tablet Take 1 tablet (40 mg total) by mouth daily. 30 tablet 5   No current facility-administered medications for this visit.     Past Medical History  Diagnosis Date  . Anemia   . Cataracts, bilateral   . Chronic systolic CHF (congestive heart failure)     a. ischemic CM EF 15-20%;  b. s/p AICD  05/24/04;  c. Echo 7/06: EF 30-40%, mild reduced RVSF  . CAD (coronary artery disease)     a. s/p AMI, s/p PTCA & stent of cx 12/04;  b. LHC (5/05): Proximal LAD 100% with bridging collaterals (CTO), proximal circumflex 20%, mid circumflex 95% ISR, proximal-mid RCA 75%, EF 20% >>PCI: 3.0 x 28 mm Cypher DES to the mid CFX  . Diverticulosis of colon   . Hyperlipidemia   . PVD (peripheral vascular disease)     s/p L carotid PTCA/stent 2004  . Transient ischemic attack   . Erectile dysfunction   . Hyperkalemia 08/2008    K=5.7   . BBB (bundle branch block)     right  . HTN (hypertension)   . Elevated PSA   . Adenomatous colon polyp 02/14/2012  . ICD (implantable cardiac defibrillator) in place 12-25-2012    MDT CRTD upgrade by Dr Lovena Le  . Pacemaker   . Type II diabetes mellitus   . Myocardial infarction 1990  . Arthritis     "right hip" (12/25/2012)  . Carotid stenosis     a. s/p L carotid stent 2004;  b. Carotid US (09/2014): Bilateral ICA 1-39%, left ECA >59%, normal subclavian bilaterally, occluded left vertebral >> FU 2 years  . Pericardial effusion     Echocardiogram (09/2014): EF 25% with distal anterior, distal inferior, distal lateral and apical akinesis, grade 1 diastolic dysfunction, very  mild aortic stenosis (mean 7 mmHg) - this may be depressed due to low EF (2-D images suggest mild to moderate aortic stenosis), large pericardial effusion, no RA collapse    ROS:   All systems reviewed and negative except as noted in the HPI.   Past Surgical History  Procedure Laterality Date  . Cardiac defibrillator placement  05/24/2004    Implantation of a MDT single-chamber defibrillator  . Carotid stent  09/11/2003    Percutaneous transluminal angioplasty and stent placement of the left internal carotid artery.  . Biv icd upgrade  12/25/2012    MDT CRTD upgrade by Dr Lovena Le for ischemic cardiomyopathy and worsening conduction system disease  . Cataract extraction w/ intraocular lens   implant, bilateral Bilateral ~ 2010  . Cardiac catheterization  06/2003,  01/2004  . Coronary angioplasty with stent placement  1990    "2" (12/25/2012)  . Bi-ventricular implantable cardioverter defibrillator upgrade N/A 12/25/2012    Procedure: BI-VENTRICULAR IMPLANTABLE CARDIOVERTER DEFIBRILLATOR UPGRADE;  Surgeon: Evans Lance, MD;  Location: [facility] CATH LAB;  Service: Cardiovascular;  Laterality: N/A;  . Lead revision N/A 12/25/2012    Procedure: LEAD REVISION;  Surgeon: Evans Lance, MD;  Location: [facility] CATH LAB;  Service: Cardiovascular;  Laterality: N/A;     Family History  Problem Relation Age of Onset  . Diabetes Mother   . Diabetes Brother   . Heart attack Neg Hx   . Stroke Neg Hx      History   Social History  . Marital Status: Widowed    Spouse Name: N/A  . Number of Children: N/A  . Years of Education: 12   Occupational History  . retired    Social History Main Topics  . Smoking status: Former Smoker    Types: Cigarettes    Quit date: 12/27/1967  . Smokeless tobacco: Never Used  . Alcohol Use: No     Comment: 12/25/2012 "quit all alcohol 60 yr ago"  . Drug Use: No  . Sexual Activity: Not Currently   Other Topics Concern  . Not on file   Social History Narrative   Single, 2 adult children, daughter in [place], son in [place]     BP 122/50 mmHg  Pulse 58  Ht 5\' 10"  (1.778 m)  Wt 183 lb 6.4 oz (83.19 kg)  BMI 26.32 kg/m2  Physical Exam:  stable appearing 81 year old man, NAD HEENT: Unremarkable Neck:  7 cm JVD, no thyromegally Lungs:  Clear with no wheezes, rales, or rhonchi. HEART:  Regular rate rhythm, no murmurs, no rubs, no clicks Abd:  soft, positive bowel sounds, no organomegally, no rebound, no guarding Ext:  2 plus pulses, 2+ peripheral edema, no cyanosis, no clubbing Skin:  No rashes no nodules Neuro:  CN II through XII intact, motor grossly intact   DEVICE  Normal device function.  See PaceArt for details.   Assess/Plan:

## 2014-12-15 NOTE — Assessment & Plan Note (Signed)
His Medtronic biventricular device is working normally. He has had no tachycardia therapies. We'll plan to recheck in several months.

## 2014-12-15 NOTE — Patient Instructions (Signed)
Remote monitoring is used to monitor your Pacemaker of ICD from home. This monitoring reduces the number of office visits required to check your device to one time per year. It allows [place] to keep an eye on the functioning of your device to ensure it is working properly. You are scheduled for a device check from home on 03/16/2015. You may send your transmission at any time that day. If you have a wireless device, the transmission will be sent automatically. After your physician reviews your transmission, you will receive a postcard with your next transmission date.  Your physician wants you to follow-up in: 12 months with Dr. Lovena Le.  You will receive a reminder letter in the mail two months in advance. If you don't receive a letter, please call our office to schedule the follow-up appointment.

## 2014-12-15 NOTE — Assessment & Plan Note (Signed)
His symptoms are class II. I encouraged the patient to maintain a low-sodium diet. He has had problems with dietary indiscretion in the past. He'll continue his current medications.

## 2014-12-15 NOTE — Assessment & Plan Note (Signed)
He is currently asymptomatic. His last echo demonstrated that his effusion had improved with diuretic therapy. His peripheral edema has resolved. His jugular venous distention is not increased.

## 2014-12-15 NOTE — Assessment & Plan Note (Signed)
He denies anginal symptoms. He will continue his current medications. 

## 2015-01-05 ENCOUNTER — Telehealth: Payer: Self-pay | Admitting: *Deleted

## 2015-01-05 MED ORDER — ALBUTEROL SULFATE HFA 108 (90 BASE) MCG/ACT IN AERS: 1.0000 | INHALATION_SPRAY | RESPIRATORY_TRACT | Status: DC | PRN

## 2015-01-05 NOTE — Telephone Encounter (Signed)
I changed prescription as requested.

## 2015-01-05 NOTE — Telephone Encounter (Deleted)
     Received faxed notice from pt's insurance that proventil hfa is non-preferred. Ventolin is the preferred medication, will send info to pcp for review and medication change if appropriate. Please advise.[name]4/12/20169:33 AM

## 2015-01-05 NOTE — Telephone Encounter (Signed)
Received faxed notice from pt's insurance that proventil hfa is non-preferred. Ventolin is the preferred medication, will send info to pcp for review and medication change if appropriate. Please advise.[name]4/12/20169:33 AM

## 2015-01-16 ENCOUNTER — Observation Stay (HOSPITAL_COMMUNITY)
Admission: EM | Admit: 2015-01-16 | Discharge: 2015-01-17 | Disposition: A | Discharge status: Home / Self Care | Payer: Commercial Managed Care - HMO | Attending: Internal Medicine | Admitting: Internal Medicine

## 2015-01-16 ENCOUNTER — Encounter (HOSPITAL_COMMUNITY): Payer: Self-pay | Admitting: Neurology

## 2015-01-16 ENCOUNTER — Emergency Department (HOSPITAL_COMMUNITY): Payer: Commercial Managed Care - HMO

## 2015-01-16 DIAGNOSIS — E162 Hypoglycemia, unspecified: Secondary | ICD-10-CM | POA: Diagnosis present

## 2015-01-16 DIAGNOSIS — I129 Hypertensive chronic kidney disease with stage 1 through stage 4 chronic kidney disease, or unspecified chronic kidney disease: Secondary | ICD-10-CM | POA: Insufficient documentation

## 2015-01-16 DIAGNOSIS — I4510 Unspecified right bundle-branch block: Secondary | ICD-10-CM | POA: Diagnosis not present

## 2015-01-16 DIAGNOSIS — E119 Type 2 diabetes mellitus without complications: Principal | ICD-10-CM | POA: Insufficient documentation

## 2015-01-16 DIAGNOSIS — I252 Old myocardial infarction: Secondary | ICD-10-CM | POA: Insufficient documentation

## 2015-01-16 DIAGNOSIS — Z87891 Personal history of nicotine dependence: Secondary | ICD-10-CM | POA: Insufficient documentation

## 2015-01-16 DIAGNOSIS — I739 Peripheral vascular disease, unspecified: Secondary | ICD-10-CM | POA: Diagnosis not present

## 2015-01-16 DIAGNOSIS — I2510 Atherosclerotic heart disease of native coronary artery without angina pectoris: Secondary | ICD-10-CM | POA: Insufficient documentation

## 2015-01-16 DIAGNOSIS — I313 Pericardial effusion (noninflammatory): Secondary | ICD-10-CM | POA: Insufficient documentation

## 2015-01-16 DIAGNOSIS — I5022 Chronic systolic (congestive) heart failure: Secondary | ICD-10-CM | POA: Diagnosis present

## 2015-01-16 DIAGNOSIS — Z7982 Long term (current) use of aspirin: Secondary | ICD-10-CM | POA: Diagnosis not present

## 2015-01-16 DIAGNOSIS — K219 Gastro-esophageal reflux disease without esophagitis: Secondary | ICD-10-CM | POA: Insufficient documentation

## 2015-01-16 DIAGNOSIS — E785 Hyperlipidemia, unspecified: Secondary | ICD-10-CM | POA: Diagnosis not present

## 2015-01-16 DIAGNOSIS — R4182 Altered mental status, unspecified: Secondary | ICD-10-CM | POA: Diagnosis not present

## 2015-01-16 DIAGNOSIS — N183 Chronic kidney disease, stage 3 unspecified: Secondary | ICD-10-CM | POA: Diagnosis present

## 2015-01-16 DIAGNOSIS — Z95810 Presence of automatic (implantable) cardiac defibrillator: Secondary | ICD-10-CM | POA: Insufficient documentation

## 2015-01-16 DIAGNOSIS — R40242 Glasgow coma scale score 9-12: Secondary | ICD-10-CM | POA: Diagnosis not present

## 2015-01-16 DIAGNOSIS — Z794 Long term (current) use of insulin: Secondary | ICD-10-CM | POA: Insufficient documentation

## 2015-01-16 DIAGNOSIS — E1165 Type 2 diabetes mellitus with hyperglycemia: Secondary | ICD-10-CM | POA: Diagnosis not present

## 2015-01-16 DIAGNOSIS — J181 Lobar pneumonia, unspecified organism: Secondary | ICD-10-CM | POA: Diagnosis not present

## 2015-01-16 DIAGNOSIS — I255 Ischemic cardiomyopathy: Secondary | ICD-10-CM | POA: Diagnosis not present

## 2015-01-16 DIAGNOSIS — E1151 Type 2 diabetes mellitus with diabetic peripheral angiopathy without gangrene: Secondary | ICD-10-CM

## 2015-01-16 LAB — COMPREHENSIVE METABOLIC PANEL
ALBUMIN: 3.8 g/dL (ref 3.5–5.2)
ALT: 14 U/L (ref 0–53)
AST: 29 U/L (ref 0–37)
Alkaline Phosphatase: 65 U/L (ref 39–117)
Anion gap: 11 (ref 5–15)
BUN: 33 mg/dL — AB (ref 6–23)
CALCIUM: 9.9 mg/dL (ref 8.4–10.5)
CO2: 27 mmol/L (ref 19–32)
CREATININE: 1.75 mg/dL — AB (ref 0.50–1.35)
Chloride: 100 mmol/L (ref 96–112)
GFR calc Af Amer: 40 mL/min — ABNORMAL LOW (ref >90)
GFR calc non Af Amer: 35 mL/min — ABNORMAL LOW (ref >90)
Glucose, Bld: 127 mg/dL — ABNORMAL HIGH (ref 70–99)
Potassium: 4.1 mmol/L (ref 3.5–5.1)
Sodium: 138 mmol/L (ref 135–145)
Total Bilirubin: 0.7 mg/dL (ref 0.3–1.2)
Total Protein: 8 g/dL (ref 6.0–8.3)

## 2015-01-16 LAB — URINALYSIS, ROUTINE W REFLEX MICROSCOPIC
BILIRUBIN URINE: NEGATIVE
Glucose, UA: 100 mg/dL — AB
HGB URINE DIPSTICK: NEGATIVE
KETONES UR: NEGATIVE mg/dL
Leukocytes, UA: NEGATIVE
Nitrite: NEGATIVE
Protein, ur: NEGATIVE mg/dL
Specific Gravity, Urine: 1.016 (ref 1.005–1.030)
UROBILINOGEN UA: 1 mg/dL (ref 0.0–1.0)
pH: 6 (ref 5.0–8.0)

## 2015-01-16 LAB — RAPID URINE DRUG SCREEN, HOSP PERFORMED
Amphetamines: NOT DETECTED
Barbiturates: NOT DETECTED
Benzodiazepines: NOT DETECTED
Cocaine: NOT DETECTED
Opiates: NOT DETECTED
Tetrahydrocannabinol: NOT DETECTED

## 2015-01-16 LAB — CBC WITH DIFFERENTIAL/PLATELET
BASOS ABS: 0 10*3/uL (ref 0.0–0.1)
BASOS PCT: 0 % (ref 0–1)
EOS ABS: 0.1 10*3/uL (ref 0.0–0.7)
Eosinophils Relative: 1 % (ref 0–5)
HCT: 47.7 % (ref 39.0–52.0)
Hemoglobin: 15.7 g/dL (ref 13.0–17.0)
Lymphocytes Relative: 20 % (ref 12–46)
Lymphs Abs: 2.2 10*3/uL (ref 0.7–4.0)
MCH: 30.3 pg (ref 26.0–34.0)
MCHC: 32.9 g/dL (ref 30.0–36.0)
MCV: 91.9 fL (ref 78.0–100.0)
MONO ABS: 0.8 10*3/uL (ref 0.1–1.0)
Monocytes Relative: 7 % (ref 3–12)
NEUTROS ABS: 8.2 10*3/uL — AB (ref 1.7–7.7)
NEUTROS PCT: 72 % (ref 43–77)
Platelets: 172 10*3/uL (ref 150–400)
RBC: 5.19 MIL/uL (ref 4.22–5.81)
RDW: 13.6 % (ref 11.5–15.5)
WBC: 11.3 10*3/uL — AB (ref 4.0–10.5)

## 2015-01-16 LAB — CBG MONITORING, ED
Glucose-Capillary: 122 mg/dL — ABNORMAL HIGH (ref 70–99)
Glucose-Capillary: 68 mg/dL — ABNORMAL LOW (ref 70–99)
Glucose-Capillary: 78 mg/dL (ref 70–99)
Glucose-Capillary: 137 mg/dL — ABNORMAL HIGH (ref 70–99)
Glucose-Capillary: 195 mg/dL — ABNORMAL HIGH (ref 70–99)

## 2015-01-16 LAB — GLUCOSE, CAPILLARY: Glucose-Capillary: 193 mg/dL — ABNORMAL HIGH (ref 70–99)

## 2015-01-16 LAB — ETHANOL

## 2015-01-16 MED ORDER — PANTOPRAZOLE SODIUM 40 MG PO TBEC
40.0000 mg | DELAYED_RELEASE_TABLET | Freq: Every day | ORAL | Status: DC
  Administered 2015-01-16 – 2015-01-17 (×2): 40 mg via ORAL
  Filled 2015-01-16 (×2): qty 1

## 2015-01-16 MED ORDER — ASPIRIN EC 81 MG PO TBEC
81.0000 mg | DELAYED_RELEASE_TABLET | Freq: Every day | ORAL | Status: DC
  Administered 2015-01-16 – 2015-01-17 (×2): 81 mg via ORAL
  Filled 2015-01-16 (×2): qty 1

## 2015-01-16 MED ORDER — CARVEDILOL 25 MG PO TABS
25.0000 mg | ORAL_TABLET | Freq: Two times a day (BID) | ORAL | Status: DC
  Administered 2015-01-17: 25 mg via ORAL
  Filled 2015-01-16 (×3): qty 1

## 2015-01-16 MED ORDER — ALBUTEROL SULFATE (2.5 MG/3ML) 0.083% IN NEBU: 2.5000 mg | INHALATION_SOLUTION | RESPIRATORY_TRACT | Status: DC | PRN

## 2015-01-16 MED ORDER — INSULIN GLARGINE 100 UNIT/ML ~~LOC~~ SOLN
20.0000 [IU] | Freq: Every day | SUBCUTANEOUS | Status: DC
  Administered 2015-01-16: 20 [IU] via SUBCUTANEOUS
  Filled 2015-01-16 (×3): qty 0.2

## 2015-01-16 MED ORDER — HEPARIN SODIUM (PORCINE) 5000 UNIT/ML IJ SOLN
5000.0000 [IU] | Freq: Three times a day (TID) | INTRAMUSCULAR | Status: DC
  Administered 2015-01-16 – 2015-01-17 (×2): 5000 [IU] via SUBCUTANEOUS
  Filled 2015-01-16 (×5): qty 1

## 2015-01-16 MED ORDER — SODIUM CHLORIDE 0.9 % IV SOLN: INTRAVENOUS | Status: DC

## 2015-01-16 MED ORDER — INSULIN ASPART 100 UNIT/ML ~~LOC~~ SOLN
0.0000 [IU] | SUBCUTANEOUS | Status: DC
Start: 2015-01-16 — End: 2015-01-17
  Administered 2015-01-16: 2 [IU] via SUBCUTANEOUS
  Administered 2015-01-17: 1 [IU] via SUBCUTANEOUS
  Administered 2015-01-17: 2 [IU] via SUBCUTANEOUS

## 2015-01-16 MED ORDER — SODIUM CHLORIDE 0.9 % IV SOLN
INTRAVENOUS | Status: AC
  Administered 2015-01-16: 21:00:00 via INTRAVENOUS

## 2015-01-16 MED ORDER — SIMVASTATIN 40 MG PO TABS
40.0000 mg | ORAL_TABLET | Freq: Every day | ORAL | Status: DC
  Administered 2015-01-16 – 2015-01-17 (×2): 40 mg via ORAL
  Filled 2015-01-16 (×2): qty 1

## 2015-01-16 NOTE — ED Notes (Signed)
CBG 122 

## 2015-01-16 NOTE — Progress Notes (Signed)
Report taken from Richardson Landry, [place] in ED . Pt is to be admitted in  5 W16.

## 2015-01-16 NOTE — H&P (Signed)
Date: 01/16/2015               Patient Name:  [name] MRN: [id number]  DOB: [birth year] Age / Sex: 81 y.o., male   PCP: Robert Stakes, MD         Medical Service: Internal Medicine Teaching Service         Attending Physician: Dr. Oval Linsey, MD    First Contact: Dr. Dellia Nims Pager: [phone]  Second Contact: Dr. Juluis Mire Pager: [phone]       After Hours (After 5p/  First Contact Pager: [phone]  weekends / holidays): Second Contact Pager: [phone]   Chief Complaint: Hypoglycemia  History of Present Illness: [name] is a 81yo man with PMHx of HTN, Type 2 DM (HbA1c 7.2 on 12/03/14), CAD, chronic systolic CHF, ischemic cardiomyopathy s/p biventricular ICD, hyperlipidemia, and peripheral vascular disease who presents to the ED after a hypoglycemic event. Patient was brought to the hospital by EMS after his girlfriend found him with altered mental status on his couch. Patient reports he was out with his friends last night and didn't take his lantus at his normal time of 8-9 PM. He reports he was up until 3:30-4 AM eating food with his girlfriend and took his Lantus 46 units around that time. He notes he then went home and the next thing he knew he was being woken up by EMS personnel. He does not remember being confused. He denies any low blood sugars recently or hypoglycemic symptoms. He states his lowest blood sugar in the past few months has been 98. His highest blood sugar has been 102. He reports he had a similar episode a few years ago, but otherwise has not had hypoglycemic events. In the ED his initial CBG was 50 and improved to 122 after D50, but then dropped to 68 an hour later and was given another amp D50. He denies dizziness, confusion, chest pain, abdominal pain, nausea, and vomiting. He was noted to have some SOB initially, but he denies dyspnea currently.   Meds: No current facility-administered medications for this encounter.   Current Outpatient  Prescriptions  Medication Sig Dispense Refill  . albuterol (VENTOLIN HFA) 108 (90 BASE) MCG/ACT inhaler Inhale 1-2 puffs into the lungs every 4 (four) hours as needed for wheezing or shortness of breath. 1 Inhaler 3  . aspirin EC 81 MG tablet Take 81 mg by mouth daily.    . carvedilol (COREG) 25 MG tablet Take 1 tablet (25 mg total) by mouth 2 (two) times daily with a meal. 60 tablet 3  . furosemide (LASIX) 40 MG tablet Take 1 tablet (40 mg total) by mouth 2 (two) times daily. 180 tablet 3  . Insulin Glargine (LANTUS SOLOSTAR) 100 UNIT/ML Solostar Pen Inject 46 Units into the skin at bedtime.    Marland Kitchen lisinopril (PRINIVIL,ZESTRIL) 5 MG tablet Take 1 tablet (5 mg total) by mouth daily. 30 tablet 5  . pantoprazole (PROTONIX) 40 MG tablet Take 1 tablet (40 mg total) by mouth daily. 30 tablet 5  . simvastatin (ZOCOR) 40 MG tablet Take 1 tablet (40 mg total) by mouth daily. 30 tablet 5  . Insulin Pen Needle (B-D ULTRAFINE III SHORT PEN) 31G X 8 MM MISC Use as directed for once daily insulin injection. 100 each 3    Allergies: Allergies as of 01/16/2015  . (No Known Allergies)   Past Medical History  Diagnosis Date  . Anemia   . Cataracts, bilateral   . Chronic  systolic CHF (congestive heart failure)     a. ischemic CM EF 15-20%;  b. s/p AICD 05/24/04;  c. Echo 7/06: EF 30-40%, mild reduced RVSF  . CAD (coronary artery disease)     a. s/p AMI, s/p PTCA & stent of cx 12/04;  b. LHC (5/05): Proximal LAD 100% with bridging collaterals (CTO), proximal circumflex 20%, mid circumflex 95% ISR, proximal-mid RCA 75%, EF 20% >>PCI: 3.0 x 28 mm Cypher DES to the mid CFX  . Diverticulosis of colon   . Hyperlipidemia   . PVD (peripheral vascular disease)     s/p L carotid PTCA/stent 2004  . Transient ischemic attack   . Erectile dysfunction   . Hyperkalemia 08/2008    K=5.7   . BBB (bundle branch block)     right  . HTN (hypertension)   . Elevated PSA   . Adenomatous colon polyp 02/14/2012  . ICD  (implantable cardiac defibrillator) in place 12-25-2012    MDT CRTD upgrade by Dr Lovena Le  . Pacemaker   . Type II diabetes mellitus   . Myocardial infarction 1990  . Arthritis     "right hip" (12/25/2012)  . Carotid stenosis     a. s/p L carotid stent 2004;  b. Carotid US (09/2014): Bilateral ICA 1-39%, left ECA >59%, normal subclavian bilaterally, occluded left vertebral >> FU 2 years  . Pericardial effusion     Echocardiogram (09/2014): EF 25% with distal anterior, distal inferior, distal lateral and apical akinesis, grade 1 diastolic dysfunction, very mild aortic stenosis (mean 7 mmHg) - this may be depressed due to low EF (2-D images suggest mild to moderate aortic stenosis), large pericardial effusion, no RA collapse   Past Surgical History  Procedure Laterality Date  . Cardiac defibrillator placement  05/24/2004    Implantation of a MDT single-chamber defibrillator  . Carotid stent  09/11/2003    Percutaneous transluminal angioplasty and stent placement of the left internal carotid artery.  . Biv icd upgrade  12/25/2012    MDT CRTD upgrade by Dr Lovena Le for ischemic cardiomyopathy and worsening conduction system disease  . Cataract extraction w/ intraocular lens  implant, bilateral Bilateral ~ 2010  . Cardiac catheterization  06/2003,  01/2004  . Coronary angioplasty with stent placement  1990    "2" (12/25/2012)  . Bi-ventricular implantable cardioverter defibrillator upgrade N/A 12/25/2012    Procedure: BI-VENTRICULAR IMPLANTABLE CARDIOVERTER DEFIBRILLATOR UPGRADE;  Surgeon: Evans Lance, MD;  Location: [facility] CATH LAB;  Service: Cardiovascular;  Laterality: N/A;  . Lead revision N/A 12/25/2012    Procedure: LEAD REVISION;  Surgeon: Evans Lance, MD;  Location: [facility] CATH LAB;  Service: Cardiovascular;  Laterality: N/A;   Family History  Problem Relation Age of Onset  . Diabetes Mother   . Diabetes Brother   . Heart attack Neg Hx   . Stroke Neg Hx    History   Social History  . Marital  Status: Widowed    Spouse Name: N/A  . Number of Children: N/A  . Years of Education: 12   Occupational History  . retired    Social History Main Topics  . Smoking status: Former Smoker    Types: Cigarettes    Quit date: 12/27/1967  . Smokeless tobacco: Never Used  . Alcohol Use: No     Comment: 12/25/2012 "quit all alcohol 60 yr ago"  . Drug Use: No  . Sexual Activity: Not Currently   Other Topics Concern  . Not on file  Social History Narrative   Single, 2 adult children, daughter in [place], son in [place] of Systems: General: Denies fever, chills, night sweats, changes in weight, changes in appetite HEENT: Denies headaches, ear pain, changes in vision, rhinorrhea, sore throat CV: Denies palpitations, orthopnea Pulm: Denies cough, wheezing GI: Denies diarrhea, constipation, melena, hematochezia GU: Denies dysuria, hematuria, frequency Msk: Denies muscle cramps, joint pains Neuro: Denies weakness, numbness, tingling Skin: Denies rashes, bruising  Physical Exam: Blood pressure 170/67, pulse 62, temperature 97.7 F (36.5 C), temperature source Oral, resp. rate 24, SpO2 99 %. General: elderly man sitting up in bed, pleasant, NAD HEENT: [place]/AT, EOMI, PERRL, sclera anicteric, mucus membranes moist CV: RRR, no m/g/r Pulm: CTA bilaterally, breaths non-labored Abd: BS+, soft, non-tender Ext: Chronic appearing skin changes and dry skin in his lower extremities. Ankles appear edematous bilaterally.  Neuro: alert and oriented x 3. Able to state his home address and identify Obama as president. Speech is normal. No focal deficits present.   Lab results: Basic Metabolic Panel:  Recent Labs  01/16/15 1610  NA 138  K 4.1  CL 100  CO2 27  GLUCOSE 127*  BUN 33*  CREATININE 1.75*  CALCIUM 9.9   Liver Function Tests:  Recent Labs  01/16/15 1610  AST 29  ALT 14  ALKPHOS 65  BILITOT 0.7  PROT 8.0  ALBUMIN 3.8   CBC:  Recent Labs  01/16/15 1610    WBC 11.3*  NEUTROABS 8.2*  HGB 15.7  HCT 47.7  MCV 91.9  PLT 172   CBG:  Recent Labs  01/16/15 1604 01/16/15 1712 01/16/15 1757 01/16/15 1909  GLUCAP 122* 68* 78 137*   Urinalysis:  Recent Labs  01/16/15 1630  COLORURINE AMBER*  LABSPEC 1.016  PHURINE 6.0  GLUCOSEU 100*  HGBUR NEGATIVE  BILIRUBINUR NEGATIVE  KETONESUR NEGATIVE  PROTEINUR NEGATIVE  UROBILINOGEN 1.0  NITRITE NEGATIVE  LEUKOCYTESUR NEGATIVE   Imaging results:  Dg Chest Portable 1 View  01/16/2015   CLINICAL DATA:  Altered mental status  EXAM: PORTABLE CHEST - 1 VIEW  COMPARISON:  08/28/2014  FINDINGS: Cardiac enlargement without heart failure. AICD unchanged in position  Left lower lobe consolidation similar to the prior study. This may represent atelectasis or pneumonia. Given the large heart size this is most likely atelectasis. Right lung clear.  IMPRESSION: Left lower lobe consolidation most likely atelectasis.   Electronically Signed   By: Franchot Gallo M.D.   On: 01/16/2015 17:03    Other results: EKG: Sinus rhythm, RBBB (old)   Assessment & Plan by Problem:  Hypoglycemia with AMS: Patient presented with altered mental status after having a hypoglycemia event at home. This was in the setting of taking his insulin at a later time than normal. His CBG was 50 on admission and improved after a few amps of D50. His mental status is now back to baseline. Patient denies any other recent hypoglycemic events. His last HbA1c was 7.2 on 12/03/14. Patient reports excellent blood sugar control with most blood sugars in the 100s. He is on Lantus 46 units daily at bedtime. Patient reports he normally takes his Lantus at his scheduled time but was at friend's house which prevented him from taking his Lantus on time. Patient was educated on the importance of taking Lantus at the same time daily. Blood sugars currently normal. Will monitor overnight and adjust insulin regimen as necessary.  - Decrease Lantus to 20  units QHS - Start sensitive insulin sliding  scale - Check CBGs Q2 hours, then switch to Q4H - Carb modified diet  - Check UDS and ethanol  - Will need outpatient [facility] appt   AKI on [name] Stage 3: Cr 1.75 on admission, his baseline is ~1.4. Likely due to hypovolemia. Patient denies vomiting and diarrhea.  - Gentle IVFs @ 50 ml/hr for 6 hours (EF 20-25%) - Repeat bmet in AM  HTN: BP 200/109 on admission. Improved to [id number] systolic, then dropped to 0000000 systolic. Patient takes Coreg 25 mg BID and Lisinopril 5 mg daily at home. Will hold Lisinopril in setting of AKI.  - Start Coreg 25 mg BID in AM - Hold Lisinopril with AKI  Chronic Systolic HF, Ischemic Cardiomyopathy s/p Biventricular ICD: Patient follows with Dr. Lovena Le. Last echo on 11/19/14 shows EF 20-25% with wall motion abnormalities. A large pericardial effusion is also noted. This was initially noted on his 2D echo in January this year. Pericardiocentesis was planned, but Dr. Martinique felt the risks outweighed the benefits due to the posterolateral location of the effusion. He was recommended to take Lasix 40 mg BID and have follow up. He saw Dr. Lovena Le on 3/22 and the effusion was noted to have improved with diuretic therapy. Patient is still taking Lasix 40 mg BID at home. He is also on aspirin 81 mg daily, Coreg 25 mg BID, Lisinopril 5 mg daily, and Simvastatin 40 mg daily.  - Hold Lasix and Lisinopril given AKI - Hold Coreg until AM - Continue ASA and Simvastatin  - Careful with IVFs given low EF and pericardial effusion    Hyperlipidemia: Last lipid profile in Nov 2015 showed Chol 127, Trigly 63, HDL 45, and LDL 69. Patient takes Simvastatin 40 mg daily at home. - Continue home Simvastatin   Diet: Carb modified  VTE Ppx: Heparin SQ Dispo: Disposition is deferred at this time, awaiting improvement of current medical problems. Anticipated discharge in approximately 1 day.   The patient does have a current PCP Robert Stakes, MD) and  does need an [facility] hospital follow-up appointment after discharge.  The patient does not have transportation limitations that hinder transportation to clinic appointments.  Signed: Juliet Rude, MD 01/16/2015, 7:21 PM

## 2015-01-16 NOTE — ED Notes (Signed)
Per EMS- Pt comes from home for AMS; CBG 50 pinpoint, nonreactive pupils. D50 and narcan given. Rales heard, CPAP applied. Pt became more alert after narcan and D50 admin. Given 1 nitro, BP 163/73, HR 66, 100%

## 2015-01-16 NOTE — ED Notes (Signed)
Pt is eating his dinner tray. In NAD.

## 2015-01-16 NOTE — ED Provider Notes (Signed)
CSN: [id number]     Arrival date & time 01/16/15  1558 History   First MD Initiated Contact with Patient 01/16/15 1611     Chief Complaint  Patient presents with  . Altered Mental Status   Patient is a 81 y.o. male presenting with hypoglycemia. The history is provided by the patient and the EMS personnel.  Hypoglycemia Initial blood sugar:  50 Blood sugar after intervention:  122 Severity:  Severe Onset quality:  Gradual Duration:  12 hours Timing:  Constant Progression:  Improving Chronicity:  New Diabetic status:  Controlled with insulin Current diabetic therapy:  46 U Lantus at bedtime Time since last antidiabetic medication:  12 hours Context: decreased oral intake   Relieved by:  IV glucose Ineffective treatments:  None tried Associated symptoms: altered mental status, decreased responsiveness and shortness of breath   Associated symptoms: no vomiting   Altered mental status:    Severity:  Severe   Onset quality:  Unable to specify   Altered mental status duration: Went to bed on couch at 0400; found by girlfriend on couch, altered.   Timing:  Unable to specify Shortness of breath:    Severity:  Mild   Progression:  Resolved Risk factors: no alcohol abuse and no drug use     Past Medical History  Diagnosis Date  . Anemia   . Cataracts, bilateral   . Chronic systolic CHF (congestive heart failure)     a. ischemic CM EF 15-20%;  b. s/p AICD 05/24/04;  c. Echo 7/06: EF 30-40%, mild reduced RVSF  . CAD (coronary artery disease)     a. s/p AMI, s/p PTCA & stent of cx 12/04;  b. LHC (5/05): Proximal LAD 100% with bridging collaterals (CTO), proximal circumflex 20%, mid circumflex 95% ISR, proximal-mid RCA 75%, EF 20% >>PCI: 3.0 x 28 mm Cypher DES to the mid CFX  . Diverticulosis of colon   . Hyperlipidemia   . PVD (peripheral vascular disease)     s/p L carotid PTCA/stent 2004  . Transient ischemic attack   . Erectile dysfunction   . Hyperkalemia 08/2008    K=5.7   .  BBB (bundle branch block)     right  . HTN (hypertension)   . Elevated PSA   . Adenomatous colon polyp 02/14/2012  . ICD (implantable cardiac defibrillator) in place 12-25-2012    MDT CRTD upgrade by Dr Lovena Le  . Pacemaker   . Type II diabetes mellitus   . Myocardial infarction 1990  . Arthritis     "right hip" (12/25/2012)  . Carotid stenosis     a. s/p L carotid stent 2004;  b. Carotid US (09/2014): Bilateral ICA 1-39%, left ECA >59%, normal subclavian bilaterally, occluded left vertebral >> FU 2 years  . Pericardial effusion     Echocardiogram (09/2014): EF 25% with distal anterior, distal inferior, distal lateral and apical akinesis, grade 1 diastolic dysfunction, very mild aortic stenosis (mean 7 mmHg) - this may be depressed due to low EF (2-D images suggest mild to moderate aortic stenosis), large pericardial effusion, no RA collapse   Past Surgical History  Procedure Laterality Date  . Cardiac defibrillator placement  05/24/2004    Implantation of a MDT single-chamber defibrillator  . Carotid stent  09/11/2003    Percutaneous transluminal angioplasty and stent placement of the left internal carotid artery.  . Biv icd upgrade  12/25/2012    MDT CRTD upgrade by Dr Lovena Le for ischemic cardiomyopathy and worsening conduction system disease  .  Cataract extraction w/ intraocular lens  implant, bilateral Bilateral ~ 2010  . Cardiac catheterization  06/2003,  01/2004  . Coronary angioplasty with stent placement  1990    "2" (12/25/2012)  . Bi-ventricular implantable cardioverter defibrillator upgrade N/A 12/25/2012    Procedure: BI-VENTRICULAR IMPLANTABLE CARDIOVERTER DEFIBRILLATOR UPGRADE;  Surgeon: Evans Lance, MD;  Location: [facility] CATH LAB;  Service: Cardiovascular;  Laterality: N/A;  . Lead revision N/A 12/25/2012    Procedure: LEAD REVISION;  Surgeon: Evans Lance, MD;  Location: [facility] CATH LAB;  Service: Cardiovascular;  Laterality: N/A;   Family History  Problem Relation Age of Onset  .  Diabetes Mother   . Diabetes Brother   . Heart attack Neg Hx   . Stroke Neg Hx    History  Substance Use Topics  . Smoking status: Former Smoker    Types: Cigarettes    Quit date: 12/27/1967  . Smokeless tobacco: Never Used  . Alcohol Use: No     Comment: 12/25/2012 "quit all alcohol 60 yr ago"    Review of Systems  Constitutional: Positive for decreased responsiveness. Negative for fever and chills.  HENT: Negative for congestion and sore throat.   Respiratory: Positive for shortness of breath.   Cardiovascular: Negative for chest pain, palpitations and leg swelling.  Gastrointestinal: Negative for nausea, vomiting and abdominal pain.  Genitourinary: Negative for dysuria.  Musculoskeletal: Negative for gait problem.  Skin: Negative for rash.  Neurological: Negative for headaches.      Allergies  Review of patient's allergies indicates no known allergies.  Home Medications   Prior to Admission medications   Medication Sig Start Date End Date Taking? Authorizing Provider  albuterol (VENTOLIN HFA) 108 (90 BASE) MCG/ACT inhaler Inhale 1-2 puffs into the lungs every 4 (four) hours as needed for wheezing or shortness of breath. 01/05/15  Yes [name], MD  aspirin EC 81 MG tablet Take 81 mg by mouth daily.   Yes Historical Provider, MD  carvedilol (COREG) 25 MG tablet Take 1 tablet (25 mg total) by mouth 2 (two) times daily with a meal. 10/28/14  Yes [name], MD  furosemide (LASIX) 40 MG tablet Take 1 tablet (40 mg total) by mouth 2 (two) times daily. 11/30/14  Yes Peter M Martinique, MD  Insulin Glargine (LANTUS SOLOSTAR) 100 UNIT/ML Solostar Pen Inject 46 Units into the skin at bedtime. 12/03/14  Yes [name], MD  lisinopril (PRINIVIL,ZESTRIL) 5 MG tablet Take 1 tablet (5 mg total) by mouth daily. 11/16/14  Yes Scott T Kathlen Mody, PA-C  pantoprazole (PROTONIX) 40 MG tablet Take 1 tablet (40 mg total) by mouth daily. 09/21/14  Yes [name], MD  simvastatin (ZOCOR) 40 MG tablet  Take 1 tablet (40 mg total) by mouth daily. 09/21/14  Yes [name], MD  Insulin Pen Needle (B-D ULTRAFINE III SHORT PEN) 31G X 8 MM MISC Use as directed for once daily insulin injection. 03/19/14   [name], MD   BP 155/50 mmHg  Pulse 53  Temp(Src) 97.8 F (36.6 C) (Oral)  Resp 18  SpO2 100% Physical Exam  Constitutional: He is oriented to person, place, and time. He appears well-developed and well-nourished. No distress.  HENT:  Head: Normocephalic and atraumatic.  Right Ear: External ear normal.  Left Ear: External ear normal.  Mouth/Throat: Oropharynx is clear and moist.  Eyes: EOM are normal. Pupils are equal, round, and reactive to light.  Neck: Normal range of motion.  Cardiovascular: Normal rate and regular rhythm.  Pulmonary/Chest: Effort normal and breath sounds normal. No respiratory distress. He has no wheezes. He has no rales.  Abdominal: Soft. He exhibits no distension. There is no tenderness. There is no rebound and no guarding.  Neurological: He is alert and oriented to person, place, and time.  Face symmetric, speech clear and appropriate; sensation to touch intact in V1-V3, no tongue deviation; 5/5 strength in major muscle groups of upper and lower extremities; normal coordination  Skin: Skin is warm and dry. No rash noted. He is not diaphoretic.  Psychiatric: He has a normal mood and affect.  Vitals reviewed.   ED Course  Procedures (including critical care time) Labs Review Labs Reviewed  CBC WITH DIFFERENTIAL/PLATELET - Abnormal; Notable for the following:    WBC 11.3 (*)    Neutro Abs 8.2 (*)    All other components within normal limits  COMPREHENSIVE METABOLIC PANEL - Abnormal; Notable for the following:    Glucose, Bld 127 (*)    BUN 33 (*)    Creatinine, Ser 1.75 (*)    GFR calc non Af Amer 35 (*)    GFR calc Af Amer 40 (*)    All other components within normal limits  URINALYSIS, ROUTINE W REFLEX MICROSCOPIC - Abnormal; Notable for the  following:    Color, Urine AMBER (*)    Glucose, UA 100 (*)    All other components within normal limits  CBG MONITORING, ED - Abnormal; Notable for the following:    Glucose-Capillary 122 (*)    All other components within normal limits  CBG MONITORING, ED - Abnormal; Notable for the following:    Glucose-Capillary 68 (*)    All other components within normal limits  CBG MONITORING, ED - Abnormal; Notable for the following:    Glucose-Capillary 137 (*)    All other components within normal limits  CBG MONITORING, ED - Abnormal; Notable for the following:    Glucose-Capillary 195 (*)    All other components within normal limits  BASIC METABOLIC PANEL  CBC  URINE RAPID DRUG SCREEN (HOSP PERFORMED)  ETHANOL  CBG MONITORING, ED    Imaging Review Dg Chest Portable 1 View  01/16/2015   CLINICAL DATA:  Altered mental status  EXAM: PORTABLE CHEST - 1 VIEW  COMPARISON:  08/28/2014  FINDINGS: Cardiac enlargement without heart failure. AICD unchanged in position  Left lower lobe consolidation similar to the prior study. This may represent atelectasis or pneumonia. Given the large heart size this is most likely atelectasis. Right lung clear.  IMPRESSION: Left lower lobe consolidation most likely atelectasis.   Electronically Signed   By: Franchot Gallo M.D.   On: 01/16/2015 17:03     EKG Interpretation   Date/Time:  Saturday January 16 2015 16:05:35 EDT Ventricular Rate:  70 PR Interval:  160 QRS Duration: 186 QT Interval:  485 QTC Calculation: 523 R Axis:   -105 Text Interpretation:  Sinus rhythm Left atrial enlargement Right bundle  branch block Confirmed by RAY MD, Andee Poles [phone]) on 01/16/2015 4:13:22 PM      MDM   Final diagnoses:  Hypoglycemia   81 year old male presents to ED due to altered mental status via EMS. He reports he took his 46 units of Lantus around 4 AM went home and ate a meal and fell asleep on his couch. His girlfriend found him just prior to arrival on the  couch altered and confused. He was breathing on his own. When EMS arrived his blood glucose was 50. He  also had pinpoint pupils. He was given an amp of D50 and Narcan. He awoke had rales to the apices and was started on CPAP. He was hypertensive and was given nitroglycerin while on CPAP.  On arrival he has no complaints. He denies any headache, neck pain, chest pain, back pain, abdominal pain, nausea, vomiting, diarrhea, recent illness, fever, or chills.  Exam as above. Older gentleman in no acute distress.  Blood glucose on arrival was 122. Labs were obtained including a CBC which showed a mild leukocytosis. CMP showed elevated creatinine. Urinalysis showed no infection.  Repeat blood glucose was 68. The patient was given a sandwich applesauce and orange juice. Repeat after that was 78.  Internal medicine teaching service with consultation for admission given his persistent hypoglycemia. He remained stable in the ED, and was admitted to the internal medicine teaching service.  This case was managed in conjunction with my attending, Dr. Pattricia Boss.    Berenice Primas, MD 01/16/15 2107  Pattricia Boss, MD 01/17/15 [id number]

## 2015-01-16 NOTE — Progress Notes (Signed)
NURSING PROGRESS NOTE  [name] [id number] Admission Data: 01/16/2015 11:44 PM Attending Provider: Oval Linsey, MD [id number] DALE, MD Code Status: full    [name] is a 81 y.o. male patient admitted from ED:  -No acute distress noted.  -No complaints of shortness of breath.  -No complaints of chest pain.    Blood pressure 155/50, pulse 53, temperature 97.8 F (36.6 C), temperature source Oral, resp. rate 18, height 5\' 10"  (1.778 m), weight 78.699 kg (173 lb 8 oz), SpO2 100 %.   IV Fluids:  IV in place, occlusive dsg intact without redness, IV cath intact in Lt AC and Rt wrist   Allergies:  Review of patient's allergies indicates no known allergies.  Past Medical History:   has a past medical history of Anemia; Cataracts, bilateral; Chronic systolic CHF (congestive heart failure); CAD (coronary artery disease); Diverticulosis of colon; Hyperlipidemia; PVD (peripheral vascular disease); Transient ischemic attack; Erectile dysfunction; Hyperkalemia (08/2008); BBB (bundle branch block); HTN (hypertension); Elevated PSA; Adenomatous colon polyp (02/14/2012); ICD (implantable cardiac defibrillator) in place (12-25-2012); Pacemaker; Type II diabetes mellitus; Myocardial infarction (1990); Arthritis; Carotid stenosis; and Pericardial effusion.  Past Surgical History:   has past surgical history that includes Cardiac defibrillator placement (05/24/2004); Carotid stent (09/11/2003); BIV ICD UPGRADE (12/25/2012); Cataract extraction w/ intraocular lens  implant, bilateral (Bilateral, ~ 2010); Cardiac catheterization (06/2003,  01/2004); Coronary angioplasty with stent (1990); bi-ventricular implantable cardioverter defibrillator upgrade (N/A, 12/25/2012); and Lead revision (N/A, 12/25/2012).  Social History:   reports that he quit smoking about 47 years ago. His smoking use included Cigarettes. He has never used smokeless tobacco. He reports that he does not drink alcohol or use illicit  drugs.  Skin:abrasion in bilateral lower legs  Patient/Family orientated to room. Admission inpatient armband information verified with patient/family to include name and date of birth and placed on patient arm. Side rails up x 2, fall assessment and education completed with patient/family. Patient/family able to verbalize understanding of risk associated with falls and verbalized understanding to call for assistance before getting out of bed. Call light within reach. Patient/family able to voice and demonstrate understanding of unit orientation instructions.

## 2015-01-16 NOTE — ED Notes (Signed)
Dr. Jeanell Sparrow made aware of FSBS 55. Provided pt with [place] sandwich and orange juice.

## 2015-01-16 NOTE — Progress Notes (Signed)
Attempted to get report x 1. 

## 2015-01-17 DIAGNOSIS — I739 Peripheral vascular disease, unspecified: Secondary | ICD-10-CM | POA: Diagnosis not present

## 2015-01-17 DIAGNOSIS — Z794 Long term (current) use of insulin: Secondary | ICD-10-CM | POA: Diagnosis not present

## 2015-01-17 DIAGNOSIS — E119 Type 2 diabetes mellitus without complications: Secondary | ICD-10-CM | POA: Diagnosis not present

## 2015-01-17 DIAGNOSIS — E162 Hypoglycemia, unspecified: Secondary | ICD-10-CM

## 2015-01-17 DIAGNOSIS — I129 Hypertensive chronic kidney disease with stage 1 through stage 4 chronic kidney disease, or unspecified chronic kidney disease: Secondary | ICD-10-CM | POA: Diagnosis not present

## 2015-01-17 DIAGNOSIS — E1122 Type 2 diabetes mellitus with diabetic chronic kidney disease: Secondary | ICD-10-CM | POA: Diagnosis not present

## 2015-01-17 DIAGNOSIS — N183 Chronic kidney disease, stage 3 (moderate): Secondary | ICD-10-CM

## 2015-01-17 DIAGNOSIS — I255 Ischemic cardiomyopathy: Secondary | ICD-10-CM | POA: Diagnosis not present

## 2015-01-17 DIAGNOSIS — I5022 Chronic systolic (congestive) heart failure: Secondary | ICD-10-CM

## 2015-01-17 DIAGNOSIS — I2510 Atherosclerotic heart disease of native coronary artery without angina pectoris: Secondary | ICD-10-CM | POA: Diagnosis not present

## 2015-01-17 DIAGNOSIS — E785 Hyperlipidemia, unspecified: Secondary | ICD-10-CM | POA: Diagnosis not present

## 2015-01-17 DIAGNOSIS — I252 Old myocardial infarction: Secondary | ICD-10-CM | POA: Diagnosis not present

## 2015-01-17 LAB — BASIC METABOLIC PANEL
Anion gap: 9 (ref 5–15)
BUN: 29 mg/dL — ABNORMAL HIGH (ref 6–23)
CALCIUM: 9.2 mg/dL (ref 8.4–10.5)
CO2: 24 mmol/L (ref 19–32)
Chloride: 106 mmol/L (ref 96–112)
Creatinine, Ser: 1.47 mg/dL — ABNORMAL HIGH (ref 0.50–1.35)
GFR calc Af Amer: 49 mL/min — ABNORMAL LOW (ref >90)
GFR calc non Af Amer: 43 mL/min — ABNORMAL LOW (ref >90)
Glucose, Bld: 122 mg/dL — ABNORMAL HIGH (ref 70–99)
Potassium: 3.8 mmol/L (ref 3.5–5.1)
SODIUM: 139 mmol/L (ref 135–145)

## 2015-01-17 LAB — MRSA PCR SCREENING: MRSA BY PCR: NEGATIVE

## 2015-01-17 LAB — CBC
HEMATOCRIT: 42.7 % (ref 39.0–52.0)
Hemoglobin: 13.9 g/dL (ref 13.0–17.0)
MCH: 30.2 pg (ref 26.0–34.0)
MCHC: 32.6 g/dL (ref 30.0–36.0)
MCV: 92.6 fL (ref 78.0–100.0)
PLATELETS: 140 10*3/uL — AB (ref 150–400)
RBC: 4.61 MIL/uL (ref 4.22–5.81)
RDW: 13.8 % (ref 11.5–15.5)
WBC: 8.3 10*3/uL (ref 4.0–10.5)

## 2015-01-17 LAB — GLUCOSE, CAPILLARY
GLUCOSE-CAPILLARY: 152 mg/dL — AB (ref 70–99)
GLUCOSE-CAPILLARY: 128 mg/dL — AB (ref 70–99)
Glucose-Capillary: 102 mg/dL — ABNORMAL HIGH (ref 70–99)
Glucose-Capillary: 141 mg/dL — ABNORMAL HIGH (ref 70–99)
Glucose-Capillary: 190 mg/dL — ABNORMAL HIGH (ref 70–99)

## 2015-01-17 MED ORDER — INSULIN GLARGINE 100 UNIT/ML SOLOSTAR PEN: 30.0000 [IU] | PEN_INJECTOR | Freq: Every day | SUBCUTANEOUS | Status: DC

## 2015-01-17 MED ORDER — INSULIN ASPART 100 UNIT/ML ~~LOC~~ SOLN
0.0000 [IU] | Freq: Three times a day (TID) | SUBCUTANEOUS | Status: DC
  Administered 2015-01-17: 1 [IU] via SUBCUTANEOUS

## 2015-01-17 MED ORDER — NAPHAZOLINE HCL 0.1 % OP SOLN
1.0000 [drp] | Freq: Four times a day (QID) | OPHTHALMIC | Status: DC | PRN
  Filled 2015-01-17: qty 15

## 2015-01-17 NOTE — Discharge Summary (Signed)
Name: [name] MRN: [id number] DOB: 04-25-1931 81 y.o. PCP: [name], MD  Date of Admission: 01/16/2015  3:58 PM Date of Discharge: 01/17/2015 Attending Physician: Oval Linsey, MD  Discharge Diagnosis:  Symptomatic Hypoglycemia in setting of well-controlled Insulin-Dependent Type 2 DM Non-oliguric AKI on [name] Stage 3   Hypertension  Class II Chronic Systolic CHF in setting of ischemic cardiomyopathy s/p ICD Chronic pericardial effusion   CAD s/p PCI  Hyperlipidemia  Dry Eyes   GERD DVT Prophylaxis       Discharge Medications:   Medication List    TAKE these medications        albuterol 108 (90 BASE) MCG/ACT inhaler  Commonly known as:  VENTOLIN HFA  Inhale 1-2 puffs into the lungs every 4 (four) hours as needed for wheezing or shortness of breath.     aspirin EC 81 MG tablet  Take 81 mg by mouth daily.     carvedilol 25 MG tablet  Commonly known as:  COREG  Take 1 tablet (25 mg total) by mouth 2 (two) times daily with a meal.     furosemide 40 MG tablet  Commonly known as:  LASIX  Take 1 tablet (40 mg total) by mouth 2 (two) times daily.     Insulin Glargine 100 UNIT/ML Solostar Pen  Commonly known as:  LANTUS SOLOSTAR  Inject 30 Units into the skin at bedtime.     Insulin Pen Needle 31G X 8 MM Misc  Commonly known as:  B-D ULTRAFINE III SHORT PEN  Use as directed for once daily insulin injection.     lisinopril 5 MG tablet  Commonly known as:  PRINIVIL,ZESTRIL  Take 1 tablet (5 mg total) by mouth daily.     pantoprazole 40 MG tablet  Commonly known as:  PROTONIX  Take 1 tablet (40 mg total) by mouth daily.     simvastatin 40 MG tablet  Commonly known as:  ZOCOR  Take 1 tablet (40 mg total) by mouth daily.        Disposition and follow-up:   [name] was discharged from [facility] in Good condition.  At the hospital follow up visit please address:  1. Symptomatic Hypoglycemia in setting of  well-controlled DM - A1c 7.2 on 3/10/`6, decreased Lantus 46 U to 30 U daily, please adjust dose as necessary based on glucose meter readings     AKI - pre-renal azotemia that improved with IVF's, restarted lasix and lisinopril on discharge, please recheck BMP to ensure renal function stable and adjust diuretic dose as needed (on lasix 40 mg BID)   Dry eyes - if eye lubricant drops (refresh tears) effective, overdue for annual eye exam (last one December 18, 2013)   2.  Labs / imaging needed at time of follow-up: BMP (Cr)   3.  Pending labs/ test needing follow-up: None  Follow-up Appointments:     Follow-up Information    Follow up with Axel Filler, MD In 1 week.   Specialty:  Internal Medicine   Why:  Our clinic will call you with an appointment tommorrow    Contact information:   [place] [address] [phone]       Discharge Instructions:  -Start taking 30 units of Lantus daily instead of 46 units that you were on before, you were probably on too much insulin. Your A1c was 7.2 in March which is very good.  -Keep checking your blood sugars at  home and bring your meter with you to your next clinic visit  -Make sure you don't skip any meals and and drink fluids as well   -Continue all your other medications -Use over the counter eye lubricant for your dry eyes, like refresh tears. You are due for annual eye exam.   -Our clinic will call you tomorrow for follow-up appointment in 1 week  -Very nice meeting you!  Consultations:    Procedures Performed:  Dg Chest Portable 1 View  01/16/2015   CLINICAL DATA:  Altered mental status  EXAM: PORTABLE CHEST - 1 VIEW  COMPARISON:  08/28/2014  FINDINGS: Cardiac enlargement without heart failure. AICD unchanged in position  Left lower lobe consolidation similar to the prior study. This may represent atelectasis or pneumonia. Given the large heart size this is most likely atelectasis. Right lung clear.  IMPRESSION:  Left lower lobe consolidation most likely atelectasis.   Electronically Signed   By: Franchot Gallo M.D.   On: 01/16/2015 17:03     Admission HPI: Original Author Albin Felling MD  [name] is a 81yo man with PMHx of HTN, Type 2 DM (HbA1c 7.2 on 12/03/14), CAD, chronic systolic CHF, ischemic cardiomyopathy s/p biventricular ICD, hyperlipidemia, and peripheral vascular disease who presents to the ED after a hypoglycemic event. Patient was brought to the hospital by EMS after his girlfriend found him with altered mental status on his couch. Patient reports he was out with his friends last night and didn't take his lantus at his normal time of 8-9 PM. He reports he was up until 3:30-4 AM eating food with his girlfriend and took his Lantus 46 units around that time. He notes he then went home and the next thing he knew he was being woken up by EMS personnel. He does not remember being confused. He denies any low blood sugars recently or hypoglycemic symptoms. He states his lowest blood sugar in the past few months has been 98. His highest blood sugar has been 102. He reports he had a similar episode a few years ago, but otherwise has not had hypoglycemic events. In the ED his initial CBG was 50 and improved to 122 after D50, but then dropped to 68 an hour later and was given another amp D50. He denies dizziness, confusion, chest pain, abdominal pain, nausea, and vomiting. He was noted to have some SOB initially, but he denies dyspnea currently   Hospital Course by problem list:  Symptomatic Hypoglycemia in setting of well-controlled Insulin-Dependent Type 2 DM - Pt was found unresponsive by his girlfriend with blood glucose of 50 that improved to normal with D50 administration. In the ED his glucose level dropped tot 68 which resolved with carbohydrate intake. He reported taking his Lantus 46 U at later time than normal with normal PO intake however he had AKI on admission suggesting decreased PO intake. His  last A1c was 7.2 on 12/03/14 and is at home on Lantus 46 U daily. He is most likely on too high dose of Lantus in setting of well-controlled diabetes. He received Lantus 20 U and sensitive sliding scale insulin during hospitalization with glucose range of 102-190. He was at his normal mental status on day of discharge and had no further episodes of symptomatic hypoglycemia. He was instructed to reduce his home Lantus 46 U daily to 30 U daily and continue monitoring his blood glucose at home. He was instructed to not skip meals and maintain good PO intake. He is to bring  his glucose meter to follow-up visit with [facility] in coming week for futher insulin adjustment.  Non-oliguric AKI on [name] Stage 3 - Pt with Cr 1.75 on admission that improved to 1.47 at baseline 1.4 with administration of IVF's most likely due to pre-renal azotemia in setting of decreased PO intake and diuretic use. Pt was encouraged to maintain normal PO intake.    Hypertension - Pt with blood pressure range of 114/54-200/109 during hospitalization. Pt's home carvedilol 25 mg BID, lisinopril 5 mg daily, and lasix 40 mg BID were held in setting of volume depletion and AKI and were resumed on discharge.   Class II Chronic Systolic CHF in setting of ischemic cardiomyopathy s/p ICD - Pt with last 2D-echo on 11/19/14 with EF 20-25%. Pt has ICD in place with no recent firings. Pt with no exacerbation during hospitalization. Pt's carvedilol 25 mg BID, lisinopril 5 mg daily, and lasix 40 mg BID were held in setting of volume depletion and AKI and were resumed on discharge. Pt follows with cardiologist Dr. Lovena Le .  Chronic Pericardial Effusion - Pt with no hemodynamic instability or concern for cardiac tamponade during hospitalization. Pt's lasix 40 mg BID was held in setting of volume depletion and was resumed on discharge. Pt is followed by cardiologist Dr. Lovena Le.    CAD s/p PCI - Pt with no anginal pain during hospitalization. His carvedilol and  lisinopril were initially held but resumed on discharge. He was continued on aspirin 81 mg daily and simvastatin 40 mg daily during hospitalization.   Hyperlipidemia - Last lipid panel on 07/29/14 was normal with LDL 69 at goal <100. Pt was continued on home simvastatin 40 mg daily during hospitalization.    Dry Eyes - Pt reported dry eyes with no vision change. He was instructed to try OTC lubricant such as refresh tears. His last annual eye exam was on 12/18/13, he will need repeat exam which is to be addressed at hospital follow-up visit.   GERD - Pt was continued on home protonix 40 mg daily with no acid reflux symptoms during hospitalization.    DVT Prophylaxis - Pt received SQ heparin during hospitalization with no evidence of DVT during hospitalization.    Discharge Vitals:   BP 114/54 mmHg  Pulse 62  Temp(Src) 98.3 F (36.8 C) (Oral)  Resp 18  Ht 5\' 10"  (1.778 m)  Wt 173 lb 8 oz (78.699 kg)  BMI 24.89 kg/m2  SpO2 96%  Discharge Labs:  Results for orders placed or performed during the hospital encounter of 01/16/15 (from the past 24 hour(s))  CBG monitoring, ED     Status: Abnormal   Collection Time: 01/16/15  4:04 PM  Result Value Ref Range   Glucose-Capillary 122 (H) 70 - 99 mg/dL   Comment 1 Notify RN    Comment 2 Document in Chart   CBC with Differential     Status: Abnormal   Collection Time: 01/16/15  4:10 PM  Result Value Ref Range   WBC 11.3 (H) 4.0 - 10.5 K/uL   RBC 5.19 4.22 - 5.81 MIL/uL   Hemoglobin 15.7 13.0 - 17.0 g/dL   HCT 47.7 39.0 - 52.0 %   MCV 91.9 78.0 - 100.0 fL   MCH 30.3 26.0 - 34.0 pg   MCHC 32.9 30.0 - 36.0 g/dL   RDW 13.6 11.5 - 15.5 %   Platelets 172 150 - 400 K/uL   Neutrophils Relative % 72 43 - 77 %   Neutro Abs 8.2 (H)  1.7 - 7.7 K/uL   Lymphocytes Relative 20 12 - 46 %   Lymphs Abs 2.2 0.7 - 4.0 K/uL   Monocytes Relative 7 3 - 12 %   Monocytes Absolute 0.8 0.1 - 1.0 K/uL   Eosinophils Relative 1 0 - 5 %   Eosinophils Absolute 0.1  0.0 - 0.7 K/uL   Basophils Relative 0 0 - 1 %   Basophils Absolute 0.0 0.0 - 0.1 K/uL  Comprehensive metabolic panel     Status: Abnormal   Collection Time: 01/16/15  4:10 PM  Result Value Ref Range   Sodium 138 135 - 145 mmol/L   Potassium 4.1 3.5 - 5.1 mmol/L   Chloride 100 96 - 112 mmol/L   CO2 27 19 - 32 mmol/L   Glucose, Bld 127 (H) 70 - 99 mg/dL   BUN 33 (H) 6 - 23 mg/dL   Creatinine, Ser 1.75 (H) 0.50 - 1.35 mg/dL   Calcium 9.9 8.4 - 10.5 mg/dL   Total Protein 8.0 6.0 - 8.3 g/dL   Albumin 3.8 3.5 - 5.2 g/dL   AST 29 0 - 37 U/L   ALT 14 0 - 53 U/L   Alkaline Phosphatase 65 39 - 117 U/L   Total Bilirubin 0.7 0.3 - 1.2 mg/dL   GFR calc non Af Amer 35 (L) >90 mL/min   GFR calc Af Amer 40 (L) >90 mL/min   Anion gap 11 5 - 15  Urinalysis, Routine w reflex microscopic     Status: Abnormal   Collection Time: 01/16/15  4:30 PM  Result Value Ref Range   Color, Urine AMBER (A) YELLOW   APPearance CLEAR CLEAR   Specific Gravity, Urine 1.016 1.005 - 1.030   pH 6.0 5.0 - 8.0   Glucose, UA 100 (A) NEGATIVE mg/dL   Hgb urine dipstick NEGATIVE NEGATIVE   Bilirubin Urine NEGATIVE NEGATIVE   Ketones, ur NEGATIVE NEGATIVE mg/dL   Protein, ur NEGATIVE NEGATIVE mg/dL   Urobilinogen, UA 1.0 0.0 - 1.0 mg/dL   Nitrite NEGATIVE NEGATIVE   Leukocytes, UA NEGATIVE NEGATIVE  CBG monitoring, ED     Status: Abnormal   Collection Time: 01/16/15  5:12 PM  Result Value Ref Range   Glucose-Capillary 68 (L) 70 - 99 mg/dL   Comment 1 Notify RN    Comment 2 Document in Chart   CBG monitoring, ED     Status: None   Collection Time: 01/16/15  5:57 PM  Result Value Ref Range   Glucose-Capillary 78 70 - 99 mg/dL   Comment 1 Notify RN    Comment 2 Document in Chart   CBG monitoring, ED     Status: Abnormal   Collection Time: 01/16/15  7:09 PM  Result Value Ref Range   Glucose-Capillary 137 (H) 70 - 99 mg/dL  CBG monitoring, ED     Status: Abnormal   Collection Time: 01/16/15  8:19 PM  Result  Value Ref Range   Glucose-Capillary 195 (H) 70 - 99 mg/dL  MRSA PCR Screening     Status: None   Collection Time: 01/16/15  9:15 PM  Result Value Ref Range   MRSA by PCR NEGATIVE NEGATIVE  Ethanol     Status: None   Collection Time: 01/16/15  9:20 PM  Result Value Ref Range   Alcohol, Ethyl (B) <5 0 - 9 mg/dL  Urine rapid drug screen (hosp performed)     Status: None   Collection Time: 01/16/15  9:26 PM  Result Value  Ref Range   Opiates NONE DETECTED NONE DETECTED   Cocaine NONE DETECTED NONE DETECTED   Benzodiazepines NONE DETECTED NONE DETECTED   Amphetamines NONE DETECTED NONE DETECTED   Tetrahydrocannabinol NONE DETECTED NONE DETECTED   Barbiturates NONE DETECTED NONE DETECTED  Glucose, capillary     Status: Abnormal   Collection Time: 01/16/15 10:16 PM  Result Value Ref Range   Glucose-Capillary 193 (H) 70 - 99 mg/dL   Comment 1 Notify RN    Comment 2 Document in Chart   Glucose, capillary     Status: Abnormal   Collection Time: 01/17/15 12:24 AM  Result Value Ref Range   Glucose-Capillary 190 (H) 70 - 99 mg/dL   Comment 1 Notify RN    Comment 2 Document in Chart   Glucose, capillary     Status: Abnormal   Collection Time: 01/17/15  2:26 AM  Result Value Ref Range   Glucose-Capillary 152 (H) 70 - 99 mg/dL  Glucose, capillary     Status: Abnormal   Collection Time: 01/17/15  3:59 AM  Result Value Ref Range   Glucose-Capillary 141 (H) 70 - 99 mg/dL  Basic metabolic panel     Status: Abnormal   Collection Time: 01/17/15  6:09 AM  Result Value Ref Range   Sodium 139 135 - 145 mmol/L   Potassium 3.8 3.5 - 5.1 mmol/L   Chloride 106 96 - 112 mmol/L   CO2 24 19 - 32 mmol/L   Glucose, Bld 122 (H) 70 - 99 mg/dL   BUN 29 (H) 6 - 23 mg/dL   Creatinine, Ser 1.47 (H) 0.50 - 1.35 mg/dL   Calcium 9.2 8.4 - 10.5 mg/dL   GFR calc non Af Amer 43 (L) >90 mL/min   GFR calc Af Amer 49 (L) >90 mL/min   Anion gap 9 5 - 15  CBC     Status: Abnormal   Collection Time: 01/17/15  6:09  AM  Result Value Ref Range   WBC 8.3 4.0 - 10.5 K/uL   RBC 4.61 4.22 - 5.81 MIL/uL   Hemoglobin 13.9 13.0 - 17.0 g/dL   HCT 42.7 39.0 - 52.0 %   MCV 92.6 78.0 - 100.0 fL   MCH 30.2 26.0 - 34.0 pg   MCHC 32.6 30.0 - 36.0 g/dL   RDW 13.8 11.5 - 15.5 %   Platelets 140 (L) 150 - 400 K/uL  Glucose, capillary     Status: Abnormal   Collection Time: 01/17/15  7:34 AM  Result Value Ref Range   Glucose-Capillary 102 (H) 70 - 99 mg/dL    Signed: Juluis Mire, MD 01/17/2015, 11:30 AM    Services Ordered on Discharge: None Equipment Ordered on Discharge: None

## 2015-01-17 NOTE — Progress Notes (Signed)
UR completed 

## 2015-01-17 NOTE — Progress Notes (Signed)
Nsg Discharge Note  Admit Date:  01/16/2015 Discharge date: 01/17/2015   [name] to be D/C'd Home per MD order.  AVS completed.  Copy for chart, and copy for patient signed, and dated. Patient/caregiver able to verbalize understanding.  Discharge Medication:   Medication List    TAKE these medications        albuterol 108 (90 BASE) MCG/ACT inhaler  Commonly known as:  VENTOLIN HFA  Inhale 1-2 puffs into the lungs every 4 (four) hours as needed for wheezing or shortness of breath.     aspirin EC 81 MG tablet  Take 81 mg by mouth daily.     carvedilol 25 MG tablet  Commonly known as:  COREG  Take 1 tablet (25 mg total) by mouth 2 (two) times daily with a meal.     furosemide 40 MG tablet  Commonly known as:  LASIX  Take 1 tablet (40 mg total) by mouth 2 (two) times daily.     Insulin Glargine 100 UNIT/ML Solostar Pen  Commonly known as:  LANTUS SOLOSTAR  Inject 30 Units into the skin at bedtime.     Insulin Pen Needle 31G X 8 MM Misc  Commonly known as:  B-D ULTRAFINE III SHORT PEN  Use as directed for once daily insulin injection.     lisinopril 5 MG tablet  Commonly known as:  PRINIVIL,ZESTRIL  Take 1 tablet (5 mg total) by mouth daily.     pantoprazole 40 MG tablet  Commonly known as:  PROTONIX  Take 1 tablet (40 mg total) by mouth daily.     simvastatin 40 MG tablet  Commonly known as:  ZOCOR  Take 1 tablet (40 mg total) by mouth daily.        Discharge Assessment: Filed Vitals:   01/17/15 0613  BP: 114/54  Pulse: 62  Temp: 98.3 F (36.8 C)  Resp: 18   Skin clean, dry and intact without evidence of skin break down, no evidence of skin tears noted. IV catheter discontinued intact. Site without signs and symptoms of complications - no redness or edema noted at insertion site, patient denies c/o pain - only slight tenderness at site.  Dressing with slight pressure applied.  D/c Instructions-Education: Discharge instructions given to patient/family  with verbalized understanding. D/c education completed with patient/family including follow up instructions, medication list, d/c activities limitations if indicated, with other d/c instructions as indicated by MD - patient able to verbalize understanding, all questions fully answered. Patient instructed to return to ED, call 911, or call MD for any changes in condition.  Patient escorted via [place], and D/C home via private auto.  Dayle Points, RN 01/17/2015 2:00 PM

## 2015-01-17 NOTE — Progress Notes (Signed)
Subjective:  Pt seen and examined in AM. No acute events overnight. His blood glucose overnight and this morning has ranged from 122-190. He reports he feels his normal self and ready to go home. He reports eating yesterday and taking his normal 46 U of Lantus last night. He has not had recent symptomatic hypoglycemia. He is alert and orientated x 3. He complains of dry eyes.     Objective: Vital signs in last 24 hours: Filed Vitals:   01/16/15 2000 01/16/15 2015 01/16/15 2056 01/17/15 0613  BP: 135/56 141/51 155/50 114/54  Pulse: 68 56 53 62  Temp:   97.8 F (36.6 C) 98.3 F (36.8 C)  TempSrc:   Oral Oral  Resp: 26 14 18 18   Height:   5\' 10"  (1.778 m)   Weight:   173 lb 8 oz (78.699 kg)   SpO2: 97% 94% 100% 96%   Weight change:   Intake/Output Summary (Last 24 hours) at 01/17/15 1037 Last data filed at 01/17/15 0930  Gross per 24 hour  Intake 1096.67 ml  Output    430 ml  Net 666.67 ml   PHYSICAL EXAMINATION: General: NAD Heart: Normal rate and rhythm with no murmur or rub Lungs: Clear to auscultation bilaterally with  Abdomen: Soft, non-distended, non-tender with normal BS Extremities: No edema  Neuro: A & O x 3     Lab Results: Basic Metabolic Panel:  Recent Labs Lab 01/16/15 1610 01/17/15 0609  NA 138 139  K 4.1 3.8  CL 100 106  CO2 27 24  GLUCOSE 127* 122*  BUN 33* 29*  CREATININE 1.75* 1.47*  CALCIUM 9.9 9.2   Liver Function Tests:  Recent Labs Lab 01/16/15 1610  AST 29  ALT 14  ALKPHOS 65  BILITOT 0.7  PROT 8.0  ALBUMIN 3.8   No results for input(s): LIPASE, AMYLASE in the last 168 hours. No results for input(s): AMMONIA in the last 168 hours. CBC:  Recent Labs Lab 01/16/15 1610 01/17/15 0609  WBC 11.3* 8.3  NEUTROABS 8.2*  --   HGB 15.7 13.9  HCT 47.7 42.7  MCV 91.9 92.6  PLT 172 140*   CBG:  Recent Labs Lab 01/16/15 1909 01/16/15 2019 01/16/15 2216 01/17/15 0024 01/17/15 0226 01/17/15 0359  GLUCAP 137* 195*  193* 190* 152* 141*   Urine Drug Screen: Drugs of Abuse     Component Value Date/Time   LABOPIA NONE DETECTED 01/16/2015 2126   COCAINSCRNUR NONE DETECTED 01/16/2015 2126   LABBENZ NONE DETECTED 01/16/2015 2126   AMPHETMU NONE DETECTED 01/16/2015 2126   THCU NONE DETECTED 01/16/2015 2126   LABBARB NONE DETECTED 01/16/2015 2126    Alcohol Level:  Recent Labs Lab 01/16/15 2120  ETH <5   Urinalysis:  Recent Labs Lab 01/16/15 1630  COLORURINE AMBER*  LABSPEC 1.016  PHURINE 6.0  GLUCOSEU 100*  HGBUR NEGATIVE  BILIRUBINUR NEGATIVE  KETONESUR NEGATIVE  PROTEINUR NEGATIVE  UROBILINOGEN 1.0  NITRITE NEGATIVE  LEUKOCYTESUR NEGATIVE   Micro Results: Recent Results (from the past 240 hour(s))  MRSA PCR Screening     Status: None   Collection Time: 01/16/15  9:15 PM  Result Value Ref Range Status   MRSA by PCR NEGATIVE NEGATIVE Final    Comment:        The GeneXpert MRSA Assay (FDA approved for NASAL specimens only), is one component of a comprehensive MRSA colonization surveillance program. It is not intended to diagnose MRSA infection nor to guide or monitor treatment  for MRSA infections.    Studies/Results: Dg Chest Portable 1 View  01/16/2015   CLINICAL DATA:  Altered mental status  EXAM: PORTABLE CHEST - 1 VIEW  COMPARISON:  08/28/2014  FINDINGS: Cardiac enlargement without heart failure. AICD unchanged in position  Left lower lobe consolidation similar to the prior study. This may represent atelectasis or pneumonia. Given the large heart size this is most likely atelectasis. Right lung clear.  IMPRESSION: Left lower lobe consolidation most likely atelectasis.   Electronically Signed   By: Franchot Gallo M.D.   On: 01/16/2015 17:03   Medications: I have reviewed the patient's current medications. Scheduled Meds: . aspirin EC  81 mg Oral Daily  . carvedilol  25 mg Oral BID WC  . heparin  5,000 Units Subcutaneous 3 times per day  . insulin aspart  0-9 Units  Subcutaneous TID WC  . insulin glargine  20 Units Subcutaneous QHS  . pantoprazole  40 mg Oral Daily  . simvastatin  40 mg Oral Daily   Continuous Infusions:  PRN Meds:.albuterol, naphazoline Assessment/Plan:  Hypoglycemia in setting of Insulin-Dependent Type 2 DM - Glucose 122 this AM. Last A1c 7.2 on 12/03/14. Pt at home on Lantus 46 U daily. He is most likely on too high dose of Lantus in setting of well-controlled diabetes.  -Pt to be discharged on reduced dose of Lantus 30 U daily (at home on 46 U)  -Pt instructed to continue checking his blood sugar at home   -Pt to have close follow-up with [facility] in 1 week (will contact front desk to contact him with an appt on Monday as they are currently closed)  Non-oliguric AKI on [name] Stage 3 - Resolved. Cr 1.47 at baseline 1.4 that improved with gentle IVF's most likely pre-renal azotemia in setting of decreased PO intake.  -Encourage PO intake -Restart home lasix 40 mg BID and lisinopril 5 mg daily on discharge  -Avoid nephrotoxins  Hypertension - Currently normotensive.  -Continue home carvedilol 25 mg BID, lisinopril 5 mg daily, and lasix 40 mg BID   Class II Chronic Systolic CHF in setting of ischemic cardiomyopathy s/p ICD - No current exacerbation. Pt with last 2D-echo on 11/19/14 with EF 20-25%. Pt has ICD in place with no recent firings. -Continue carvedilol 25 mg BID, lisinopril 5 mg daily, and lasix 40 mg BID  -Pt follows with cardiologist Dr. Lovena Le   Pericardial Effusion - No hemodynamic instability and currently asymptomatic.   -Continue lasix 40 mg BID -Pt follows with cardiologist Dr. Lovena Le   CAD s/p PCI - No current anginal pain. -Continue aspirin 81 mg daily, carvedilol 25 mg BID, simvastatin 40 mg daily, and lisinopril 5 mg daily   Hyperlipidemia - Last lipid panel on 07/29/14 was normal with LDL 69 at goal <100.  -Continue simvastatin 40 mg daily   Dry Eyes - Pt reports dry eyes with no vision changes.  -Start naphcon  QID PRN  -Pt due for annual eye exam (last one 12/18/13)  GERD - No current reflux symptoms.  -Continue home protonix 40 mg dialy   Dispo: Today   The patient does have a current PCP Robert Stakes, MD) and does need an [facility] hospital follow-up appointment after discharge.  The patient does have transportation limitations that hinder transportation to clinic appointments.  .Services Needed at time of discharge: Y = Yes, Blank = No PT:  No  OT:  No  RN:  No  Equipment:  None  Other:  None  Juluis Mire, MD 01/17/2015, 10:37 AM

## 2015-01-17 NOTE — Discharge Instructions (Signed)
-  Start taking 30 units of Lantus daily instead of 46 units that you were on before, you were probably on too much insulin. Your A1c was 7.2 in March which is very good.  -Keep checking your blood sugars at home and bring your meter with you to your next clinic visit  -Make sure you don't skip any meals and and drink fluids as well   -Continue all your other medications -Use over the counter eye lubricant for your dry eyes, like refresh tears. You are due for annual eye exam.   -Our clinic will call you tomorrow for follow-up appointment in 1 week  -Very nice meeting you!    Low Blood Sugar Low blood sugar (hypoglycemia) means that the level of sugar in your blood is lower than it should be. Signs of low blood sugar include:  Getting sweaty.  Feeling hungry.  Feeling dizzy or weak.  Feeling sleepier than normal.  Feeling nervous.  Headaches.  Having a fast heartbeat. Low blood sugar can happen fast and can be an emergency. Your doctor can do tests to check your blood sugar level. You can have low blood sugar and not have diabetes. HOME CARE  Check your blood sugar as told by your doctor. If it is less than 70 mg/dl or as told by your doctor, take 1 of the following:  3 to 4 glucose tablets.   cup clear juice.   cup soda pop, not diet.  1 cup milk.  5 to 6 hard candies.  Recheck blood sugar after 15 minutes. Repeat until it is at the right level.  Eat a snack if it is more than 1 hour until the next meal.  Only take medicine as told by your doctor.  Do not skip meals. Eat on time.  Do not drink alcohol except with meals.  Check your blood glucose before driving.  Check your blood glucose before and after exercise.  Always carry treatment with you, such as glucose pills.  Always wear a medical alert bracelet if you have diabetes. GET HELP RIGHT AWAY IF:   Your blood glucose goes below 70 mg/dl or as told by your doctor, and you:  Are confused.  Are not  able to swallow.  Pass out (faint).  You cannot treat yourself. You may need someone to help you.  You have low blood sugar problems often.  You have problems from your medicines.  You are not feeling better after 3 to 4 days.  You have vision changes. MAKE SURE YOU:   Understand these instructions.  Will watch this condition.  Will get help right away if you are not doing well or get worse. Document Released: 12/06/2009 Document Revised: 12/04/2011 Document Reviewed: 12/06/2009 [facility] Patient Information 2015 [place], [place]. This information is not intended to replace advice given to you by your health care provider. Make sure you discuss any questions you have with your health care provider.

## 2015-01-25 ENCOUNTER — Telehealth: Payer: Self-pay | Admitting: Internal Medicine

## 2015-01-25 NOTE — Telephone Encounter (Signed)
Call to patient to confirm appointment for 01/26/15 at 10:15 lmtcb

## 2015-01-26 ENCOUNTER — Encounter: Payer: Self-pay | Admitting: Internal Medicine

## 2015-01-26 ENCOUNTER — Ambulatory Visit: Payer: Commercial Managed Care - HMO | Admitting: Internal Medicine

## 2015-01-28 DIAGNOSIS — N3941 Urge incontinence: Secondary | ICD-10-CM | POA: Diagnosis not present

## 2015-01-28 DIAGNOSIS — R972 Elevated prostate specific antigen [PSA]: Secondary | ICD-10-CM | POA: Diagnosis not present

## 2015-01-28 DIAGNOSIS — R339 Retention of urine, unspecified: Secondary | ICD-10-CM | POA: Diagnosis not present

## 2015-02-12 ENCOUNTER — Emergency Department (INDEPENDENT_AMBULATORY_CARE_PROVIDER_SITE_OTHER)
Admission: EM | Admit: 2015-02-12 | Discharge: 2015-02-12 | Disposition: A | Discharge status: Home / Self Care | Payer: Commercial Managed Care - HMO | Source: Home / Self Care | Attending: Family Medicine | Admitting: Family Medicine

## 2015-02-12 ENCOUNTER — Encounter (HOSPITAL_COMMUNITY): Payer: Self-pay | Admitting: Emergency Medicine

## 2015-02-12 DIAGNOSIS — H1013 Acute atopic conjunctivitis, bilateral: Secondary | ICD-10-CM

## 2015-02-12 NOTE — ED Notes (Signed)
C/o bilateral pink eye onset yest associated w/watery eyes, sneezing and itching Alert, no signs of acute distress.

## 2015-02-12 NOTE — Discharge Instructions (Signed)
Thank you for coming in today. Use over-the-counter Zaditor eyedrops (Ketotifen) Use over-the-counter Zyrtec (cetirizine)  Use Systane artificial tears as needed   Allergic Conjunctivitis The conjunctiva is a thin membrane that covers the visible white part of the eyeball and the underside of the eyelids. This membrane protects and lubricates the eye. The membrane has small blood vessels running through it that can normally be seen. When the conjunctiva becomes inflamed, the condition is called conjunctivitis. In response to the inflammation, the conjunctival blood vessels become swollen. The swelling results in redness in the normally white part of the eye. The blood vessels of this membrane also react when a person has allergies and is then called allergic conjunctivitis. This condition usually lasts for as long as the allergy persists. Allergic conjunctivitis cannot be passed to another person (non-contagious). The likelihood of bacterial infection is great and the cause is not likely due to allergies if the inflamed eye has:  A sticky discharge.  Discharge or sticking together of the lids in the morning.  Scaling or flaking of the eyelids where the eyelashes come out.  Red swollen eyelids. CAUSES   Viruses.  Irritants such as foreign bodies.  Chemicals.  General allergic reactions.  Inflammation or serious diseases in the inside or the outside of the eye or the orbit (the boney cavity in which the eye sits) can cause a "red eye." SYMPTOMS   Eye redness.  Tearing.  Itchy eyes.  Burning feeling in the eyes.  Clear drainage from the eye.  Allergic reaction due to pollens or ragweed sensitivity. Seasonal allergic conjunctivitis is frequent in the spring when pollens are in the air and in the fall. DIAGNOSIS  This condition, in its many forms, is usually diagnosed based on the history and an ophthalmological exam. It usually involves both eyes. If your eyes react at the same  time every year, allergies may be the cause. While most "red eyes" are due to allergy or an infection, the role of an eye (ophthalmological) exam is important. The exam can rule out serious diseases of the eye or orbit. TREATMENT   Non-antibiotic eye drops, ointments, or medications by mouth may be prescribed if the ophthalmologist is sure the conjunctivitis is due to allergies alone.  Over-the-counter drops and ointments for allergic symptoms should be used only after other causes of conjunctivitis have been ruled out, or as your caregiver suggests. Medications by mouth are often prescribed if other allergy-related symptoms are present. If the ophthalmologist is sure that the conjunctivitis is due to allergies alone, treatment is normally limited to drops or ointments to reduce itching and burning. HOME CARE INSTRUCTIONS   Wash hands before and after applying drops or ointments, or touching the inflamed eye(s) or eyelids.  Do not let the eye dropper tip or ointment tube touch the eyelid when putting medicine in your eye.  Stop using your soft contact lenses and throw them away. Use a new pair of lenses when recovery is complete. You should run through sterilizing cycles at least three times before use after complete recovery if the old soft contact lenses are to be used. Hard contact lenses should be stopped. They need to be thoroughly sterilized before use after recovery.  Itching and burning eyes due to allergies is often relieved by using a cool cloth applied to closed eye(s). SEEK MEDICAL CARE IF:   Your problems do not go away after two or three days of treatment.  Your lids are sticky (especially in the  morning when you wake up) or stick together.  Discharge develops. Antibiotics may be needed either as drops, ointment, or by mouth.  You have extreme light sensitivity.  An oral temperature above 102 F (38.9 C) develops.  Pain in or around the eye or any other visual symptom  develops. MAKE SURE YOU:   Understand these instructions.  Will watch your condition.  Will get help right away if you are not doing well or get worse. Document Released: 12/02/2002 Document Revised: 12/04/2011 Document Reviewed: 10/28/2007 [facility] Patient Information 2015 [place], [place]. This information is not intended to replace advice given to you by your health care provider. Make sure you discuss any questions you have with your health care provider.

## 2015-02-12 NOTE — ED Provider Notes (Signed)
[name] is a 81 y.o. male who presents to Urgent Care today for itchy watery eyes and runny nose present for the last day. No fevers or chills nausea vomiting or diarrhea. No treatment tried yet. He thinks he has allergies.   Past Medical History  Diagnosis Date  . Anemia   . Cataracts, bilateral   . Chronic systolic CHF (congestive heart failure)     a. ischemic CM EF 15-20%;  b. s/p AICD 05/24/04;  c. Echo 7/06: EF 30-40%, mild reduced RVSF  . CAD (coronary artery disease)     a. s/p AMI, s/p PTCA & stent of cx 12/04;  b. LHC (5/05): Proximal LAD 100% with bridging collaterals (CTO), proximal circumflex 20%, mid circumflex 95% ISR, proximal-mid RCA 75%, EF 20% >>PCI: 3.0 x 28 mm Cypher DES to the mid CFX  . Diverticulosis of colon   . Hyperlipidemia   . PVD (peripheral vascular disease)     s/p L carotid PTCA/stent 2004  . Transient ischemic attack   . Erectile dysfunction   . Hyperkalemia 08/2008    K=5.7   . BBB (bundle branch block)     right  . HTN (hypertension)   . Elevated PSA   . Adenomatous colon polyp 02/14/2012  . ICD (implantable cardiac defibrillator) in place 12-25-2012    MDT CRTD upgrade by Dr Lovena Le  . Pacemaker   . Type II diabetes mellitus   . Myocardial infarction 1990  . Arthritis     "right hip" (12/25/2012)  . Carotid stenosis     a. s/p L carotid stent 2004;  b. Carotid US (09/2014): Bilateral ICA 1-39%, left ECA >59%, normal subclavian bilaterally, occluded left vertebral >> FU 2 years  . Pericardial effusion     Echocardiogram (09/2014): EF 25% with distal anterior, distal inferior, distal lateral and apical akinesis, grade 1 diastolic dysfunction, very mild aortic stenosis (mean 7 mmHg) - this may be depressed due to low EF (2-D images suggest mild to moderate aortic stenosis), large pericardial effusion, no RA collapse   Past Surgical History  Procedure Laterality Date  . Cardiac defibrillator placement  05/24/2004    Implantation of a MDT  single-chamber defibrillator  . Carotid stent  09/11/2003    Percutaneous transluminal angioplasty and stent placement of the left internal carotid artery.  . Biv icd upgrade  12/25/2012    MDT CRTD upgrade by Dr Lovena Le for ischemic cardiomyopathy and worsening conduction system disease  . Cataract extraction w/ intraocular lens  implant, bilateral Bilateral ~ 2010  . Cardiac catheterization  06/2003,  01/2004  . Coronary angioplasty with stent placement  1990    "2" (12/25/2012)  . Bi-ventricular implantable cardioverter defibrillator upgrade N/A 12/25/2012    Procedure: BI-VENTRICULAR IMPLANTABLE CARDIOVERTER DEFIBRILLATOR UPGRADE;  Surgeon: Evans Lance, MD;  Location: [facility] CATH LAB;  Service: Cardiovascular;  Laterality: N/A;  . Lead revision N/A 12/25/2012    Procedure: LEAD REVISION;  Surgeon: Evans Lance, MD;  Location: [facility] CATH LAB;  Service: Cardiovascular;  Laterality: N/A;   History  Substance Use Topics  . Smoking status: Former Smoker    Types: Cigarettes    Quit date: 12/27/1967  . Smokeless tobacco: Never Used  . Alcohol Use: No     Comment: 12/25/2012 "quit all alcohol 60 yr ago"   ROS as above Medications: No current facility-administered medications for this encounter.   Current Outpatient Prescriptions  Medication Sig Dispense Refill  . albuterol (VENTOLIN HFA) 108 (90 BASE)  MCG/ACT inhaler Inhale 1-2 puffs into the lungs every 4 (four) hours as needed for wheezing or shortness of breath. 1 Inhaler 3  . aspirin EC 81 MG tablet Take 81 mg by mouth daily.    . carvedilol (COREG) 25 MG tablet Take 1 tablet (25 mg total) by mouth 2 (two) times daily with a meal. 60 tablet 3  . furosemide (LASIX) 40 MG tablet Take 1 tablet (40 mg total) by mouth 2 (two) times daily. 180 tablet 3  . Insulin Glargine (LANTUS SOLOSTAR) 100 UNIT/ML Solostar Pen Inject 30 Units into the skin at bedtime. 15 mL 11  . Insulin Pen Needle (B-D ULTRAFINE III SHORT PEN) 31G X 8 MM MISC Use as directed for  once daily insulin injection. 100 each 3  . lisinopril (PRINIVIL,ZESTRIL) 5 MG tablet Take 1 tablet (5 mg total) by mouth daily. 30 tablet 5  . pantoprazole (PROTONIX) 40 MG tablet Take 1 tablet (40 mg total) by mouth daily. 30 tablet 5  . simvastatin (ZOCOR) 40 MG tablet Take 1 tablet (40 mg total) by mouth daily. 30 tablet 5   No Known Allergies   Exam:  BP 147/70 mmHg  Pulse 66  Temp(Src) 98.1 F (36.7 C) (Oral)  Resp 16  SpO2 99% Gen: Well NAD nontoxic appearing HEENT: EOMI,  MMM mild conjunctiva injection with clear watery discharge present bilaterally. Clear nasal discharge present. Lungs: Normal work of breathing. CTABL Heart: RRR no MRG Abd: NABS, Soft. Nondistended, Nontender Exts: Brisk capillary refill, warm and well perfused.   No results found for this or any previous visit (from the past 24 hour(s)). No results found.  Assessment and Plan: 82 y.o. male with allergic conjunctivitis and rhinitis. Treat with Zaditor Zyrtec. Follow up with PCP.   Discussed warning signs or symptoms. Please see discharge instructions. Patient expresses understanding.     Gregor Hams, MD 02/12/15 [phone]

## 2015-02-25 DIAGNOSIS — Z9841 Cataract extraction status, right eye: Secondary | ICD-10-CM | POA: Diagnosis not present

## 2015-02-25 DIAGNOSIS — H1089 Other conjunctivitis: Secondary | ICD-10-CM | POA: Diagnosis not present

## 2015-03-02 ENCOUNTER — Other Ambulatory Visit: Payer: Self-pay | Admitting: Internal Medicine

## 2015-03-16 ENCOUNTER — Encounter: Payer: Commercial Managed Care - HMO | Admitting: *Deleted

## 2015-03-16 ENCOUNTER — Telehealth: Payer: Self-pay | Admitting: Cardiology

## 2015-03-16 NOTE — Telephone Encounter (Signed)
Attempted to confirm remote transmission with pt. No answer and was unable to leave a message.   

## 2015-03-17 ENCOUNTER — Encounter: Payer: Self-pay | Admitting: Cardiology

## 2015-03-30 ENCOUNTER — Other Ambulatory Visit: Payer: Self-pay | Admitting: Internal Medicine

## 2015-03-30 ENCOUNTER — Encounter: Payer: Self-pay | Admitting: Internal Medicine

## 2015-03-30 NOTE — Telephone Encounter (Signed)
Was D/C'd hospital in April for hypoglycemia and insulin dose adjusted. Not seen since. Pls [facility] res appt soon (2 weeks)

## 2015-04-06 ENCOUNTER — Ambulatory Visit (INDEPENDENT_AMBULATORY_CARE_PROVIDER_SITE_OTHER): Payer: Commercial Managed Care - HMO | Admitting: Internal Medicine

## 2015-04-06 ENCOUNTER — Encounter: Payer: Self-pay | Admitting: Internal Medicine

## 2015-04-06 VITALS — BP 116/81 | HR 60 | Temp 97.5°F | Ht 70.0 in | Wt 184.3 lb

## 2015-04-06 DIAGNOSIS — E1122 Type 2 diabetes mellitus with diabetic chronic kidney disease: Secondary | ICD-10-CM

## 2015-04-06 DIAGNOSIS — N189 Chronic kidney disease, unspecified: Secondary | ICD-10-CM

## 2015-04-06 DIAGNOSIS — Z794 Long term (current) use of insulin: Secondary | ICD-10-CM | POA: Diagnosis not present

## 2015-04-06 DIAGNOSIS — I129 Hypertensive chronic kidney disease with stage 1 through stage 4 chronic kidney disease, or unspecified chronic kidney disease: Secondary | ICD-10-CM | POA: Diagnosis not present

## 2015-04-06 DIAGNOSIS — I10 Essential (primary) hypertension: Secondary | ICD-10-CM

## 2015-04-06 DIAGNOSIS — N179 Acute kidney failure, unspecified: Secondary | ICD-10-CM | POA: Diagnosis not present

## 2015-04-06 DIAGNOSIS — E1165 Type 2 diabetes mellitus with hyperglycemia: Secondary | ICD-10-CM | POA: Diagnosis not present

## 2015-04-06 DIAGNOSIS — E119 Type 2 diabetes mellitus without complications: Secondary | ICD-10-CM

## 2015-04-06 LAB — BASIC METABOLIC PANEL WITH GFR
BUN: 38 mg/dL — ABNORMAL HIGH (ref 6–23)
CO2: 28 meq/L (ref 19–32)
Calcium: 9.9 mg/dL (ref 8.4–10.5)
Chloride: 101 mEq/L (ref 96–112)
Creat: 2.45 mg/dL — ABNORMAL HIGH (ref 0.50–1.35)
GFR, Est African American: 27 mL/min — ABNORMAL LOW
GFR, Est Non African American: 24 mL/min — ABNORMAL LOW
Glucose, Bld: 142 mg/dL — ABNORMAL HIGH (ref 70–99)
POTASSIUM: 4.8 meq/L (ref 3.5–5.3)
Sodium: 138 mEq/L (ref 135–145)

## 2015-04-06 LAB — GLUCOSE, CAPILLARY: GLUCOSE-CAPILLARY: 141 mg/dL — AB (ref 65–99)

## 2015-04-06 LAB — POCT GLYCOSYLATED HEMOGLOBIN (HGB A1C): HEMOGLOBIN A1C: 8.8

## 2015-04-06 NOTE — Assessment & Plan Note (Addendum)
Lab Results  Component Value Date   HGBA1C 8.8 04/06/2015   HGBA1C 7.2 12/03/2014   HGBA1C 7.8 07/29/2014     Assessment: Diabetes control:  uncontrolled but goal is more lenient given his age, co-morbidities and hypoglycemia requiring hospitalization in April.  He was discharged on decreased dose of 30 units daily. Progress toward A1C goal:   worsened Comments: He reports compliance with Lantus 30 units daily and has brought his Lantus and other medications in.  Says he feels well, eats well, no hypoglycemic symptoms or CBG < 90.  Plan: Medications:  continue current medications:  Lantus 30 units daily. Home glucose monitoring:  daily Frequency:   Timing:   Instruction/counseling given: reminded to bring blood glucose meter & log to each visit; he is scheduled to see eye doctor at [facility] next month. Educational resources provided: brochure (declined) Self management tools provided:   Other plans: I am not going to make changes today because he did not bring his meter.  He denies hypoglycemic symptoms and says CBGs are usually 90-low 100s.  He may benefit from addition of metformin in the future (less chance of hypoglycemia) and perhaps insulin can be reduced further.  RTC in 1 month with meter to make further adjustments at that time.

## 2015-04-06 NOTE — Assessment & Plan Note (Signed)
AKI during April 2016 admission.  BMP today.

## 2015-04-06 NOTE — Progress Notes (Signed)
Subjective:    Patient ID: [name], male    DOB: 27-Sep-1930, 81 y.o.   MRN: [id number]  HPI Comments: [name] is an 81 year old with PMH as below here for hospital follow-up.  Please see problem based charting for assessment and plan.      Past Medical History  Diagnosis Date  . Anemia   . Cataracts, bilateral   . Chronic systolic CHF (congestive heart failure)     a. ischemic CM EF 15-20%;  b. s/p AICD 05/24/04;  c. Echo 7/06: EF 30-40%, mild reduced RVSF  . CAD (coronary artery disease)     a. s/p AMI, s/p PTCA & stent of cx 12/04;  b. LHC (5/05): Proximal LAD 100% with bridging collaterals (CTO), proximal circumflex 20%, mid circumflex 95% ISR, proximal-mid RCA 75%, EF 20% >>PCI: 3.0 x 28 mm Cypher DES to the mid CFX  . Diverticulosis of colon   . Hyperlipidemia   . PVD (peripheral vascular disease)     s/p L carotid PTCA/stent 2004  . Transient ischemic attack   . Erectile dysfunction   . Hyperkalemia 08/2008    K=5.7   . BBB (bundle branch block)     right  . HTN (hypertension)   . Elevated PSA   . Adenomatous colon polyp 02/14/2012  . ICD (implantable cardiac defibrillator) in place 12-25-2012    MDT CRTD upgrade by Dr Lovena Le  . Pacemaker   . Type II diabetes mellitus   . Myocardial infarction 1990  . Arthritis     "right hip" (12/25/2012)  . Carotid stenosis     a. s/p L carotid stent 2004;  b. Carotid US (09/2014): Bilateral ICA 1-39%, left ECA >59%, normal subclavian bilaterally, occluded left vertebral >> FU 2 years  . Pericardial effusion     Echocardiogram (09/2014): EF 25% with distal anterior, distal inferior, distal lateral and apical akinesis, grade 1 diastolic dysfunction, very mild aortic stenosis (mean 7 mmHg) - this may be depressed due to low EF (2-D images suggest mild to moderate aortic stenosis), large pericardial effusion, no RA collapse   Current Outpatient Prescriptions on File Prior to Visit  Medication Sig Dispense Refill  . Insulin  Glargine (LANTUS SOLOSTAR) 100 UNIT/ML Solostar Pen Inject 30 Units into the skin at bedtime. 15 mL 11  . albuterol (VENTOLIN HFA) 108 (90 BASE) MCG/ACT inhaler Inhale 1-2 puffs into the lungs every 4 (four) hours as needed for wheezing or shortness of breath. 1 Inhaler 3  . aspirin EC 81 MG tablet Take 81 mg by mouth daily.    . B-D ULTRAFINE III SHORT PEN 31G X 8 MM MISC USE AS DIRECTED ONCE DAILY FOR INSULIN INJECTION 100 each 0  . carvedilol (COREG) 25 MG tablet TAKE 1 TABLET BY MOUTH TWICE DAILY WITH A MEAL 180 tablet 0  . furosemide (LASIX) 40 MG tablet Take 1 tablet (40 mg total) by mouth 2 (two) times daily. 180 tablet 3  . lisinopril (PRINIVIL,ZESTRIL) 5 MG tablet Take 1 tablet (5 mg total) by mouth daily. 30 tablet 5  . pantoprazole (PROTONIX) 40 MG tablet TAKE 1 TABLET BY MOUTH DAILY 90 tablet 2  . simvastatin (ZOCOR) 40 MG tablet TAKE 1 TABLET BY MOUTH DAILY 90 tablet 1   No current facility-administered medications on file prior to visit.    Review of Systems  Constitutional: Negative for fever, chills and appetite change.  HENT: Negative for hearing loss.   Eyes: Negative for visual disturbance.  Respiratory: Negative for shortness of breath.   Cardiovascular: Negative for chest pain, palpitations and leg swelling.       Negative for PND, DOE or orthopnea  Gastrointestinal: Negative for nausea, vomiting, abdominal pain, diarrhea, constipation and blood in stool.  Genitourinary: Negative for dysuria, hematuria and difficulty urinating.  Neurological: Negative for syncope, weakness and light-headedness.  Psychiatric/Behavioral: Negative for dysphoric mood.       Filed Vitals:   04/06/15 1342  BP: 116/81  Pulse: 60  Temp: 97.5 F (36.4 C)  TempSrc: Oral  Height: 5\' 10"  (1.778 m)  Weight: 184 lb 4.8 oz (83.598 kg)  SpO2: 100%     Objective:   Physical Exam  Constitutional: He is oriented to person, place, and time. He appears well-developed. No distress.  HENT:    Head: Normocephalic and atraumatic.  Mouth/Throat: Oropharynx is clear and moist. No oropharyngeal exudate.  Eyes: EOM are normal. Pupils are equal, round, and reactive to light.  Neck: Neck supple.  Cardiovascular: Normal rate and normal heart sounds.  Exam reveals no gallop and no friction rub.   No murmur heard. Pulmonary/Chest: Effort normal and breath sounds normal. No respiratory distress. He has no wheezes. He has no rales. He exhibits no tenderness.  Abdominal: Soft. He exhibits no distension. There is no tenderness. There is no rebound and no guarding.  Musculoskeletal: Normal range of motion. He exhibits no edema or tenderness.  Neurological: He is alert and oriented to person, place, and time. No cranial nerve deficit.  Skin: Skin is warm. He is not diaphoretic.  Psychiatric: He has a normal mood and affect. His behavior is normal. Judgment and thought content normal.  Vitals reviewed.         Assessment & Plan:  Please see problem based charting for assessment and plan.

## 2015-04-06 NOTE — Assessment & Plan Note (Signed)
BP Readings from Last 3 Encounters:  04/06/15 116/81  02/12/15 147/70  01/17/15 114/54    Lab Results  Component Value Date   NA 139 01/17/2015   K 3.8 01/17/2015   CREATININE 1.47* 01/17/2015    Assessment: Blood pressure control:  well controlled Progress toward BP goal:   at goal Comments: No symptoms of OH.  Compliant with BP meds.   Plan: Medications:  continue current medications:  Coreg 25mg  BID, Lasix 40mg  BID and lisinopril 5mg  daily Educational resources provided: brochure (declined) Self management tools provided:   Other plans: RTC in 3 months

## 2015-04-06 NOTE — Patient Instructions (Signed)
1. Please come bring your blood sugar meter with you when you come back to clinic in 1 month.  I will call you if there are problems with your labs.   2. Please take all medications as prescribed.    3. If you have worsening of your symptoms or new symptoms arise, please call the clinic [phone]), or go to the ER immediately if symptoms are severe.

## 2015-04-07 NOTE — Progress Notes (Signed)
Internal Medicine Clinic Attending  Case discussed with Dr. Wilson at the time of the visit.  We reviewed the resident's history and exam and pertinent patient test results.  I agree with the assessment, diagnosis, and plan of care documented in the resident's note.  

## 2015-04-14 ENCOUNTER — Ambulatory Visit (INDEPENDENT_AMBULATORY_CARE_PROVIDER_SITE_OTHER): Payer: Commercial Managed Care - HMO | Admitting: Internal Medicine

## 2015-04-14 ENCOUNTER — Encounter: Payer: Self-pay | Admitting: Internal Medicine

## 2015-04-14 ENCOUNTER — Ambulatory Visit (HOSPITAL_COMMUNITY)
Admission: RE | Admit: 2015-04-14 | Discharge: 2015-04-14 | Disposition: A | Discharge status: Home / Self Care | Payer: Commercial Managed Care - HMO | Source: Ambulatory Visit | Attending: Internal Medicine | Admitting: Internal Medicine

## 2015-04-14 VITALS — BP 141/71 | HR 59 | Temp 97.6°F | Ht 70.0 in | Wt 173.4 lb

## 2015-04-14 DIAGNOSIS — R200 Anesthesia of skin: Secondary | ICD-10-CM | POA: Insufficient documentation

## 2015-04-14 DIAGNOSIS — N179 Acute kidney failure, unspecified: Secondary | ICD-10-CM

## 2015-04-14 DIAGNOSIS — G319 Degenerative disease of nervous system, unspecified: Secondary | ICD-10-CM | POA: Diagnosis not present

## 2015-04-14 DIAGNOSIS — R748 Abnormal levels of other serum enzymes: Secondary | ICD-10-CM | POA: Diagnosis not present

## 2015-04-14 DIAGNOSIS — E1122 Type 2 diabetes mellitus with diabetic chronic kidney disease: Secondary | ICD-10-CM

## 2015-04-14 DIAGNOSIS — I129 Hypertensive chronic kidney disease with stage 1 through stage 4 chronic kidney disease, or unspecified chronic kidney disease: Secondary | ICD-10-CM | POA: Diagnosis not present

## 2015-04-14 DIAGNOSIS — N189 Chronic kidney disease, unspecified: Secondary | ICD-10-CM

## 2015-04-14 DIAGNOSIS — Z8679 Personal history of other diseases of the circulatory system: Secondary | ICD-10-CM

## 2015-04-14 DIAGNOSIS — H6123 Impacted cerumen, bilateral: Secondary | ICD-10-CM | POA: Diagnosis not present

## 2015-04-14 DIAGNOSIS — I2510 Atherosclerotic heart disease of native coronary artery without angina pectoris: Secondary | ICD-10-CM

## 2015-04-14 DIAGNOSIS — Z7982 Long term (current) use of aspirin: Secondary | ICD-10-CM

## 2015-04-14 DIAGNOSIS — R9082 White matter disease, unspecified: Secondary | ICD-10-CM | POA: Diagnosis not present

## 2015-04-14 DIAGNOSIS — R7989 Other specified abnormal findings of blood chemistry: Secondary | ICD-10-CM | POA: Diagnosis not present

## 2015-04-14 DIAGNOSIS — Z8673 Personal history of transient ischemic attack (TIA), and cerebral infarction without residual deficits: Secondary | ICD-10-CM

## 2015-04-14 DIAGNOSIS — E1151 Type 2 diabetes mellitus with diabetic peripheral angiopathy without gangrene: Secondary | ICD-10-CM

## 2015-04-14 NOTE — Assessment & Plan Note (Signed)
Lab Results  Component Value Date   CREATININE 2.45* 04/06/2015   CREATININE 1.47* 01/17/2015   CREATININE 1.75* 01/16/2015   Last Cr significantly above baseline.  He was advised to decrease Lasix from 40mg  BID to 40mg  daily.  He said he has been taking it daily.  He appears euvolemic.  Weight stable (I question if last week's elevated weight was correct because today he is back to baseline weight), appears euvolemic on exam.  Recheck BMP today.  If resolved, will keep Lasix at 40mg  daily.

## 2015-04-14 NOTE — Assessment & Plan Note (Addendum)
Numbness of entire left side of body (except face) x 3 days.  Improving today but he still feels it a little.  He denies weakness, falls, change in vision.  Neuro exam is normal.  Given his hx of TIA, hx of carotid stenosis, CAD, PVD, HTN and DM he is at risk of CVA.  He is on ASA. - CT head w/o contrast (3 days out from symptom onset I would expect CT to reveal abnormality by this time; will avoid MRI due to ICD) - negative for acute abnormalities

## 2015-04-14 NOTE — Progress Notes (Signed)
Subjective:    Patient ID: [name], male    DOB: Feb 17, 1931, 81 y.o.   MRN: [id number]  HPI Comments: [name] is an 81 year old male with PMH as below here for follow-up of elevated Cr and has c/o left sided numbness x 3 days.  Please see problem based charting for assessment and plan.     Past Medical History  Diagnosis Date  . Anemia   . Cataracts, bilateral   . Chronic systolic CHF (congestive heart failure)     a. ischemic CM EF 15-20%;  b. s/p AICD 05/24/04;  c. Echo 7/06: EF 30-40%, mild reduced RVSF  . CAD (coronary artery disease)     a. s/p AMI, s/p PTCA & stent of cx 12/04;  b. LHC (5/05): Proximal LAD 100% with bridging collaterals (CTO), proximal circumflex 20%, mid circumflex 95% ISR, proximal-mid RCA 75%, EF 20% >>PCI: 3.0 x 28 mm Cypher DES to the mid CFX  . Diverticulosis of colon   . Hyperlipidemia   . PVD (peripheral vascular disease)     s/p L carotid PTCA/stent 2004  . Transient ischemic attack   . Erectile dysfunction   . Hyperkalemia 08/2008    K=5.7   . BBB (bundle branch block)     right  . HTN (hypertension)   . Elevated PSA   . Adenomatous colon polyp 02/14/2012  . ICD (implantable cardiac defibrillator) in place 12-25-2012    MDT CRTD upgrade by Dr Lovena Le  . Pacemaker   . Type II diabetes mellitus   . Myocardial infarction 1990  . Arthritis     "right hip" (12/25/2012)  . Carotid stenosis     a. s/p L carotid stent 2004;  b. Carotid US (09/2014): Bilateral ICA 1-39%, left ECA >59%, normal subclavian bilaterally, occluded left vertebral >> FU 2 years  . Pericardial effusion     Echocardiogram (09/2014): EF 25% with distal anterior, distal inferior, distal lateral and apical akinesis, grade 1 diastolic dysfunction, very mild aortic stenosis (mean 7 mmHg) - this may be depressed due to low EF (2-D images suggest mild to moderate aortic stenosis), large pericardial effusion, no RA collapse   Current Outpatient Prescriptions on File Prior to Visit   Medication Sig Dispense Refill  . albuterol (VENTOLIN HFA) 108 (90 BASE) MCG/ACT inhaler Inhale 1-2 puffs into the lungs every 4 (four) hours as needed for wheezing or shortness of breath. 1 Inhaler 3  . aspirin EC 81 MG tablet Take 81 mg by mouth daily.    . B-D ULTRAFINE III SHORT PEN 31G X 8 MM MISC USE AS DIRECTED ONCE DAILY FOR INSULIN INJECTION 100 each 0  . carvedilol (COREG) 25 MG tablet TAKE 1 TABLET BY MOUTH TWICE DAILY WITH A MEAL 180 tablet 0  . furosemide (LASIX) 40 MG tablet Take 1 tablet (40 mg total) by mouth 2 (two) times daily. 180 tablet 3  . Insulin Glargine (LANTUS SOLOSTAR) 100 UNIT/ML Solostar Pen Inject 30 Units into the skin at bedtime. 15 mL 11  . lisinopril (PRINIVIL,ZESTRIL) 5 MG tablet Take 1 tablet (5 mg total) by mouth daily. 30 tablet 5  . pantoprazole (PROTONIX) 40 MG tablet TAKE 1 TABLET BY MOUTH DAILY 90 tablet 2  . simvastatin (ZOCOR) 40 MG tablet TAKE 1 TABLET BY MOUTH DAILY 90 tablet 1   No current facility-administered medications on file prior to visit.    Review of Systems  Constitutional: Negative for fever, chills, diaphoresis, appetite change, fatigue and  unexpected weight change.  Respiratory: Negative for cough, shortness of breath and wheezing.        Denies orthopnea, PND, DOE  Cardiovascular: Negative for chest pain, palpitations and leg swelling.  Gastrointestinal: Negative for nausea, vomiting, abdominal pain, diarrhea, constipation, blood in stool and abdominal distention.  Genitourinary: Negative for dysuria, hematuria and difficulty urinating.  Musculoskeletal: Negative for myalgias.  Neurological: Positive for numbness. Negative for syncope, weakness and light-headedness.       Left sided numbness x 3 days.  Entire left side, except face.  Easing off today.        Filed Vitals:   04/14/15 1402  BP: 141/71  Pulse: 59  Temp: 97.6 F (36.4 C)  TempSrc: Oral  Height: 5\' 10"  (1.778 m)  Weight: 173 lb 6.4 oz (78.654 kg)  SpO2:  100%     Objective:   Physical Exam  Constitutional: He is oriented to person, place, and time. He appears well-developed. No distress.  HENT:  Head: Normocephalic and atraumatic.  Right Ear: External ear normal.  Left Ear: External ear normal.  Mouth/Throat: Oropharynx is clear and moist. No oropharyngeal exudate.  Moderate earwax B/L  Eyes: EOM are normal. Pupils are equal, round, and reactive to light.  Neck: Neck supple. No JVD present.  Cardiovascular: Normal rate, regular rhythm, normal heart sounds and intact distal pulses.  Exam reveals no gallop and no friction rub.   No murmur heard. Pulmonary/Chest: Effort normal and breath sounds normal. No respiratory distress. He has no wheezes. He has no rales.  Abdominal: Soft. Bowel sounds are normal. He exhibits no distension. There is no tenderness. There is no rebound and no guarding.  Musculoskeletal: Normal range of motion. He exhibits edema. He exhibits no tenderness.  1+ ankle edema B/L  Neurological: He is alert and oriented to person, place, and time. He has normal reflexes. No cranial nerve deficit.  5/5 MMS upper and lower extremities.  Sensation is grossly intact upper and lower extremities.  Skin: Skin is warm. He is not diaphoretic.  Psychiatric: He has a normal mood and affect. His behavior is normal. Judgment and thought content normal.  Vitals reviewed.         Assessment & Plan:  Please see problem based charting for assessment and plan.

## 2015-04-14 NOTE — Assessment & Plan Note (Addendum)
He will schedule visit for wax removal.

## 2015-04-14 NOTE — Patient Instructions (Signed)
1. The CT of your head does not show a stroke.     2. Please take all medications as prescribed.    3. If you have worsening of your symptoms or new symptoms arise, please call the clinic [phone]), or go to the ER immediately if symptoms are severe.    Please come back to see your primary doctor in 1 month.

## 2015-04-15 ENCOUNTER — Telehealth: Payer: Self-pay | Admitting: Cardiology

## 2015-04-15 ENCOUNTER — Encounter: Payer: Commercial Managed Care - HMO | Admitting: *Deleted

## 2015-04-15 LAB — BASIC METABOLIC PANEL WITH GFR
BUN: 29 mg/dL — AB (ref 6–23)
CO2: 26 mEq/L (ref 19–32)
Calcium: 10.2 mg/dL (ref 8.4–10.5)
Chloride: 99 mEq/L (ref 96–112)
Creat: 1.77 mg/dL — ABNORMAL HIGH (ref 0.50–1.35)
GFR, EST AFRICAN AMERICAN: 40 mL/min — AB
GFR, EST NON AFRICAN AMERICAN: 35 mL/min — AB
GLUCOSE: 100 mg/dL — AB (ref 70–99)
Potassium: 5 mEq/L (ref 3.5–5.3)
SODIUM: 139 meq/L (ref 135–145)

## 2015-04-15 NOTE — Telephone Encounter (Signed)
Was unable to leave message. VM full

## 2015-04-15 NOTE — Telephone Encounter (Signed)
error 

## 2015-04-15 NOTE — Progress Notes (Signed)
Medicine attending: Medical history, presenting problems, physical findings, and medications, reviewed with  and Dr Duwaine Maxin on the day of the patient visit  and I concur with her evaluation and management plan.

## 2015-04-15 NOTE — Telephone Encounter (Signed)
LMOVM reminding pt to send remote transmission.   

## 2015-04-16 NOTE — Telephone Encounter (Signed)
Opened in error

## 2015-05-07 ENCOUNTER — Ambulatory Visit (INDEPENDENT_AMBULATORY_CARE_PROVIDER_SITE_OTHER): Payer: Commercial Managed Care - HMO | Admitting: Internal Medicine

## 2015-05-07 ENCOUNTER — Encounter: Payer: Self-pay | Admitting: Internal Medicine

## 2015-05-07 VITALS — BP 121/63 | HR 64 | Temp 97.5°F | Wt 177.2 lb

## 2015-05-07 DIAGNOSIS — N179 Acute kidney failure, unspecified: Secondary | ICD-10-CM

## 2015-05-07 DIAGNOSIS — I5022 Chronic systolic (congestive) heart failure: Secondary | ICD-10-CM | POA: Diagnosis not present

## 2015-05-07 DIAGNOSIS — R200 Anesthesia of skin: Secondary | ICD-10-CM

## 2015-05-07 DIAGNOSIS — N189 Chronic kidney disease, unspecified: Secondary | ICD-10-CM

## 2015-05-07 DIAGNOSIS — I10 Essential (primary) hypertension: Secondary | ICD-10-CM

## 2015-05-07 DIAGNOSIS — E119 Type 2 diabetes mellitus without complications: Secondary | ICD-10-CM | POA: Diagnosis not present

## 2015-05-07 DIAGNOSIS — D126 Benign neoplasm of colon, unspecified: Secondary | ICD-10-CM

## 2015-05-07 DIAGNOSIS — N183 Chronic kidney disease, stage 3 unspecified: Secondary | ICD-10-CM

## 2015-05-07 LAB — GLUCOSE, CAPILLARY: Glucose-Capillary: 226 mg/dL — ABNORMAL HIGH (ref 65–99)

## 2015-05-07 MED ORDER — CARVEDILOL 25 MG PO TABS: ORAL_TABLET | ORAL | Status: DC

## 2015-05-07 MED ORDER — LISINOPRIL 5 MG PO TABS: 5.0000 mg | ORAL_TABLET | Freq: Every day | ORAL | Status: DC

## 2015-05-07 MED ORDER — INSULIN GLARGINE 100 UNIT/ML SOLOSTAR PEN: 30.0000 [IU] | PEN_INJECTOR | Freq: Every day | SUBCUTANEOUS | Status: DC

## 2015-05-07 NOTE — Patient Instructions (Signed)
General Instructions: - Please continue to take all of your medications as prescribed.  - Please use the stool cards and mail them back to [place]. - Please follow up with your eye doctor and have an eye exam done.  - When your replacement meter comes, please check your blood sugars and keep a log. - We will see you in 2 months for follow up on your Diabetes. Please bring your meter and your medications with you.   Treatment Goals:  Goals (1 Years of Data) as of 05/07/15          04/06/15 12/03/14 07/29/14 12/18/13 02/19/13     Result Component   . HEMOGLOBIN A1C < 7.0  8.8 7.2 7.8 9.4    . LDL CALC < 100    69  70      Progress Toward Treatment Goals:  Treatment Goal 12/03/2014  Hemoglobin A1C improved  Blood pressure at goal    Self Care Goals & Plans:  Self Care Goal 04/14/2015  Manage my medications take my medicines as prescribed; bring my medications to every visit; refill my medications on time  Monitor my health -  Eat healthy foods drink diet soda or water instead of juice or soda; eat more vegetables; eat foods that are low in salt; eat baked foods instead of fried foods  Be physically active -  Meeting treatment goals -    Home Blood Glucose Monitoring 12/03/2014  Check my blood sugar 2 times a day  When to check my blood sugar before meals     Care Management & Community Referrals:  Referral 12/03/2014  Referrals made for care management support none needed  Referrals made to community resources none

## 2015-05-07 NOTE — Assessment & Plan Note (Signed)
Denies any dyspnea, orthopnea or PND today. Currently on Coreg 25 mg BID, Lasix 40 mg daily and Lisinopril 5 mg daily. Only has some trace pedal edema today. Weight is up 4 lbs from previous visit but appears euvolemic. Followed by Dr. Lovena Le.   Will continue current medications.

## 2015-05-07 NOTE — Assessment & Plan Note (Signed)
Patient reports that the numbness on his left side has resolved completely. He reports symptoms improved after 3-4 days and he has not had any reoccurrence of any neuro symptoms. Neuro exam is normal.

## 2015-05-07 NOTE — Assessment & Plan Note (Signed)
Lab Results  Component Value Date   CREATININE 1.77* 04/14/2015   CREATININE 2.45* 04/06/2015   CREATININE 1.47* 01/17/2015   Last Cr was improved. Previous baseline appears to be around 1.4 -1.5. This may be his new baseline following the AKI. He has decreased his Lasix to 40 mg daily. Weight is up ~4lbs from previous visit but he appears euvolemic on exam today. Will recheck BMP today.

## 2015-05-07 NOTE — Assessment & Plan Note (Signed)
Lab Results  Component Value Date   HGBA1C 8.8 04/06/2015   HGBA1C 7.2 12/03/2014   HGBA1C 7.8 07/29/2014     Assessment: Diabetes control:  uncontrolled Progress toward A1C goal:   worsened Comments: He is currently taking Lantus 30 units daily as his only Diabetes medication. Was previously on Lantus 44 units but hospitalized in April for hypoglycemia and dose was adjusted. He reports he has not had any symptoms of hypoglycemia since his hospitalization. He says that his meter broke last month and he has sent off to get a new one but has not received it yet.   Plan: Medications:  continue current medications Home glucose monitoring: daily Frequency:   Timing:   Instruction/counseling given: reminded to get eye exam. He was suppose to have an appointment at the end of July at [facility] but was unable to go due to transportation issues. He reports today that he will re-schedule and has people who can take him to his appointment. Reminded to bring blood glucose meter & log to each visit and reminded to bring medications to each visit Educational resources provided:   Self management tools provided:   Other plans: RTC in 2 months to recheck A1c and check meter. May require medication adjustments at that time. Would not start him on a sulfonylurea or any other medication that could lower his blood sugar. Will recheck him kidney function today. If stable, he may be able to start Metformin in the future.

## 2015-05-07 NOTE — Assessment & Plan Note (Signed)
Patient never followed up with GI referred. States he never heard from the about scheduling an appointment. He prefers to not have a colonoscopy done and would rather do the stool heme occult cards yearly.  Provided stool cards for him today and instructed him to mail them when he is done with them.

## 2015-05-07 NOTE — Assessment & Plan Note (Signed)
BP Readings from Last 3 Encounters:  05/07/15 121/63  04/14/15 141/71  04/06/15 116/81    Lab Results  Component Value Date   NA 139 04/14/2015   K 5.0 04/14/2015   CREATININE 1.77* 04/14/2015    Assessment: Blood pressure control:  well controlled Progress toward BP goal:   at goal Comments: Currently takes lisinopril 5 mg daily, Coreg 25 mg BID and Lasix 40 mg daily  Plan: Medications:  continue current medications Educational resources provided:   Self management tools provided:   Other plans: RTC in 2 months

## 2015-05-07 NOTE — Progress Notes (Signed)
Patient ID: [name], male   DOB: 02-03-1931, 81 y.o.   MRN: [id number]   Subjective:   Patient ID: [name] male   DOB: 1931/01/24 81 y.o.   MRN: [id number]  HPI: [name] is a 81 y.o. with a PMH listed below here today for clinic follow up of his co-morbidities and medication refills.  Please see problem based charting for further details of today's visit.  Past Medical History  Diagnosis Date  . Anemia   . Cataracts, bilateral   . Chronic systolic CHF (congestive heart failure)     a. ischemic CM EF 15-20%;  b. s/p AICD 05/24/04;  c. Echo 7/06: EF 30-40%, mild reduced RVSF  . CAD (coronary artery disease)     a. s/p AMI, s/p PTCA & stent of cx 12/04;  b. LHC (5/05): Proximal LAD 100% with bridging collaterals (CTO), proximal circumflex 20%, mid circumflex 95% ISR, proximal-mid RCA 75%, EF 20% >>PCI: 3.0 x 28 mm Cypher DES to the mid CFX  . Diverticulosis of colon   . Hyperlipidemia   . PVD (peripheral vascular disease)     s/p L carotid PTCA/stent 2004  . Transient ischemic attack   . Erectile dysfunction   . Hyperkalemia 08/2008    K=5.7   . BBB (bundle branch block)     right  . HTN (hypertension)   . Elevated PSA   . Adenomatous colon polyp 02/14/2012  . ICD (implantable cardiac defibrillator) in place 12-25-2012    MDT CRTD upgrade by Dr Lovena Le  . Pacemaker   . Type II diabetes mellitus   . Myocardial infarction 1990  . Arthritis     "right hip" (12/25/2012)  . Carotid stenosis     a. s/p L carotid stent 2004;  b. Carotid US (09/2014): Bilateral ICA 1-39%, left ECA >59%, normal subclavian bilaterally, occluded left vertebral >> FU 2 years  . Pericardial effusion     Echocardiogram (09/2014): EF 25% with distal anterior, distal inferior, distal lateral and apical akinesis, grade 1 diastolic dysfunction, very mild aortic stenosis (mean 7 mmHg) - this may be depressed due to low EF (2-D images suggest mild to moderate aortic stenosis), large pericardial  effusion, no RA collapse   Current Outpatient Prescriptions  Medication Sig Dispense Refill  . albuterol (VENTOLIN HFA) 108 (90 BASE) MCG/ACT inhaler Inhale 1-2 puffs into the lungs every 4 (four) hours as needed for wheezing or shortness of breath. 1 Inhaler 3  . aspirin EC 81 MG tablet Take 81 mg by mouth daily.    . B-D ULTRAFINE III SHORT PEN 31G X 8 MM MISC USE AS DIRECTED ONCE DAILY FOR INSULIN INJECTION 100 each 0  . carvedilol (COREG) 25 MG tablet TAKE 1 TABLET BY MOUTH TWICE DAILY WITH A MEAL 180 tablet 1  . furosemide (LASIX) 40 MG tablet Take 1 tablet (40 mg total) by mouth 2 (two) times daily. 180 tablet 3  . Insulin Glargine (LANTUS SOLOSTAR) 100 UNIT/ML Solostar Pen Inject 30 Units into the skin at bedtime. 15 mL 11  . lisinopril (PRINIVIL,ZESTRIL) 5 MG tablet Take 1 tablet (5 mg total) by mouth daily. 30 tablet 5  . pantoprazole (PROTONIX) 40 MG tablet TAKE 1 TABLET BY MOUTH DAILY 90 tablet 2  . simvastatin (ZOCOR) 40 MG tablet TAKE 1 TABLET BY MOUTH DAILY 90 tablet 1   No current facility-administered medications for this visit.   Family History  Problem Relation Age of Onset  . Diabetes Mother   .  Diabetes Brother   . Heart attack Neg Hx   . Stroke Neg Hx    Social History   Social History  . Marital Status: Widowed    Spouse Name: N/A  . Number of Children: N/A  . Years of Education: 12   Occupational History  . retired    Social History Main Topics  . Smoking status: Former Smoker    Types: Cigarettes    Quit date: 12/27/1967  . Smokeless tobacco: Never Used  . Alcohol Use: No     Comment: 12/25/2012 "quit all alcohol 60 yr ago"  . Drug Use: No  . Sexual Activity: Not Asked   Other Topics Concern  . None   Social History Narrative   Single, 2 adult children, daughter in [place], son in [place]: Review of Systems  Constitutional: Negative for fever, chills and malaise/fatigue.  Respiratory: Negative for shortness of breath.    Cardiovascular: Negative for chest pain and palpitations.  Gastrointestinal: Negative for nausea, vomiting, abdominal pain, diarrhea and constipation.  Skin: Negative for rash.   Objective:  Physical Exam: Filed Vitals:   05/07/15 1333  BP: 121/63  Pulse: 64  Temp: 97.5 F (36.4 C)  TempSrc: Oral  Weight: 177 lb 3.2 oz (80.377 kg)  SpO2: 100%   Physical Exam  Constitutional: He is oriented to person, place, and time. He appears well-developed. No distress.  HENT: EOMI, PERRLA, mucus membranes moist Cardiovascular: Normal rate, regular rhythm, normal heart sounds and intact distal pulses. Exam reveals no gallop and no friction rub.  No murmur heard. Pulmonary/Chest: Effort normal and breath sounds normal. No respiratory distress. He has no wheezes. He has no rales.  Abdominal: Soft. Bowel sounds are normal. He exhibits no distension. There is no tenderness. There is no rebound and no guarding.  Musculoskeletal: Normal range of motion. He exhibits edema. He exhibits no tenderness.  Neurological: He is alert and oriented to person, place, and time. No cranial nerve deficit. 5/5 strength in all extremities, sensation intact. Skin: Skin is warm. He is not diaphoretic.  Psychiatric: He has a normal mood and affect. His behavior is normal. Judgment and thought content normal.   Assessment & Plan:  Case discussed with Dr. Lynnae January. Please refer to Problem based carting for further details of today's visit.

## 2015-05-08 LAB — RENAL FUNCTION PANEL
ALBUMIN: 4.2 g/dL (ref 3.5–4.7)
BUN/Creatinine Ratio: 14 (ref 10–22)
BUN: 29 mg/dL — ABNORMAL HIGH (ref 8–27)
CO2: 28 mmol/L (ref 18–29)
Calcium: 10.1 mg/dL (ref 8.6–10.2)
Chloride: 96 mmol/L — ABNORMAL LOW (ref 97–108)
Creatinine, Ser: 2.01 mg/dL — ABNORMAL HIGH (ref 0.76–1.27)
GFR calc Af Amer: 35 mL/min/{1.73_m2} — ABNORMAL LOW (ref >59)
GFR, EST NON AFRICAN AMERICAN: 30 mL/min/{1.73_m2} — AB (ref >59)
GLUCOSE: 244 mg/dL — AB (ref 65–99)
POTASSIUM: 4.3 mmol/L (ref 3.5–5.2)
Phosphorus: 3 mg/dL (ref 2.5–4.5)
Sodium: 139 mmol/L (ref 134–144)

## 2015-05-10 NOTE — Progress Notes (Signed)
Internal Medicine Clinic Attending  I saw and evaluated the patient.  I personally confirmed the key portions of the history and exam documented by Dr. Boswell and I reviewed pertinent patient test results.  The assessment, diagnosis, and plan were formulated together and I agree with the documentation in the resident's note. 

## 2015-05-18 ENCOUNTER — Telehealth: Payer: Self-pay | Admitting: Cardiology

## 2015-05-18 ENCOUNTER — Ambulatory Visit (INDEPENDENT_AMBULATORY_CARE_PROVIDER_SITE_OTHER): Payer: Commercial Managed Care - HMO

## 2015-05-18 DIAGNOSIS — I5022 Chronic systolic (congestive) heart failure: Secondary | ICD-10-CM

## 2015-05-18 DIAGNOSIS — Z95810 Presence of automatic (implantable) cardiac defibrillator: Secondary | ICD-10-CM

## 2015-05-18 NOTE — Telephone Encounter (Signed)
Attempted to confirm remote transmission with pt. No answer and was unable to leave a message.   

## 2015-05-20 NOTE — Progress Notes (Signed)
EPIC Encounter for ICM Monitoring  Patient Name: [name] is a 81 y.o. male Date: 05/20/2015 Primary Care Physican: Axel Filler, MD Primary Cardiologist: [place] Electrophysiologist: Lovena Le Dry Weight: 178 lbs       In the past month, have you:  1. Gained more than 2 pounds in a day or more than 5 pounds in a week? no  2. Had changes in your medications (with verification of current medications)? no  3. Had more shortness of breath than is usual for you? no  4. Limited your activity because of shortness of breath? no  5. Not been able to sleep because of shortness of breath? no  6. Had increased swelling in your feet or ankles? no  7. Had symptoms of dehydration (dizziness, dry mouth, increased thirst, decreased urine output) no  8. Had changes in sodium restriction? no  9. Been compliant with medication? Yes   ICM trend:   Follow-up plan: ICM clinic phone appointment 06/21/2015.  Optivol trending along baseline.  Patient stated he is doing well.  No changes today. Confirmed he is taking Lasix 40mg  daily.   Copy of note sent to patient's primary care physician, primary cardiologist, and device following physician.  [name], RN, CCM 05/20/2015 3:25 PM

## 2015-05-26 DIAGNOSIS — D3102 Benign neoplasm of left conjunctiva: Secondary | ICD-10-CM | POA: Diagnosis not present

## 2015-05-26 DIAGNOSIS — Z87891 Personal history of nicotine dependence: Secondary | ICD-10-CM | POA: Diagnosis not present

## 2015-05-26 DIAGNOSIS — Z9841 Cataract extraction status, right eye: Secondary | ICD-10-CM | POA: Diagnosis not present

## 2015-05-26 DIAGNOSIS — Z7982 Long term (current) use of aspirin: Secondary | ICD-10-CM | POA: Diagnosis not present

## 2015-05-26 DIAGNOSIS — Z794 Long term (current) use of insulin: Secondary | ICD-10-CM | POA: Diagnosis not present

## 2015-05-26 DIAGNOSIS — E119 Type 2 diabetes mellitus without complications: Secondary | ICD-10-CM | POA: Diagnosis not present

## 2015-05-26 DIAGNOSIS — H1045 Other chronic allergic conjunctivitis: Secondary | ICD-10-CM | POA: Diagnosis not present

## 2015-07-02 ENCOUNTER — Telehealth: Payer: Self-pay | Admitting: Student in an Organized Health Care Education/Training Program

## 2015-07-02 NOTE — Telephone Encounter (Signed)
Pt's last EYE EXAM from [facility]/ 05/26/2015 being faxed

## 2015-07-23 ENCOUNTER — Ambulatory Visit (INDEPENDENT_AMBULATORY_CARE_PROVIDER_SITE_OTHER): Payer: Commercial Managed Care - HMO

## 2015-07-23 ENCOUNTER — Telehealth: Payer: Self-pay

## 2015-07-23 DIAGNOSIS — Z95810 Presence of automatic (implantable) cardiac defibrillator: Secondary | ICD-10-CM

## 2015-07-23 DIAGNOSIS — I5022 Chronic systolic (congestive) heart failure: Secondary | ICD-10-CM

## 2015-07-23 NOTE — Telephone Encounter (Signed)
ICM transmission received 07/23/2015.  Attempted call to patient and no answer.

## 2015-07-27 DIAGNOSIS — Z9841 Cataract extraction status, right eye: Secondary | ICD-10-CM | POA: Diagnosis not present

## 2015-07-27 DIAGNOSIS — H1045 Other chronic allergic conjunctivitis: Secondary | ICD-10-CM | POA: Diagnosis not present

## 2015-07-27 DIAGNOSIS — D3102 Benign neoplasm of left conjunctiva: Secondary | ICD-10-CM | POA: Diagnosis not present

## 2015-07-27 NOTE — Progress Notes (Signed)
EPIC Encounter for ICM Monitoring  Patient Name: [name] is a 81 y.o. male Date: 07/27/2015 Primary Care Physican: Axel Filler, MD Primary Cardiologist: [place] Electrophysiologist: Lovena Le Dry Weight: 183 lbs (in office weight)       In the past month, have you:  1. Gained more than 2 pounds in a day or more than 5 pounds in a week? No, does not have scales at home to weigh.   2. Had changes in your medications (with verification of current medications)? no  3. Had more shortness of breath than is usual for you? no  4. Limited your activity because of shortness of breath? no  5. Not been able to sleep because of shortness of breath? no  6. Had increased swelling in your feet or ankles? no  7. Had symptoms of dehydration (dizziness, dry mouth, increased thirst, decreased urine output) no  8. Had changes in sodium restriction? no  9. Been compliant with medication? Yes   ICM trend: 07/26/2015   Follow-up plan: ICM clinic phone appointment on 08/27/2015.  Optivol impedance below baseline starting 06/23/2015 but starting back toward baseline on transmission date, 07/26/2015.  He stated he was feeling good and denied any HF symptoms.  No changes today.  Copy of note sent to patient's primary care physician, primary cardiologist, and device following physician.  [name], RN, CCM 07/27/2015 9:15 AM

## 2015-07-27 NOTE — Telephone Encounter (Signed)
Spoke with patient.

## 2015-08-02 ENCOUNTER — Other Ambulatory Visit: Payer: Self-pay | Admitting: Internal Medicine

## 2015-08-27 ENCOUNTER — Telehealth: Payer: Self-pay | Admitting: Cardiology

## 2015-08-27 NOTE — Telephone Encounter (Signed)
LMOVM reminding pt to send remote transmission.   

## 2015-09-01 NOTE — Progress Notes (Signed)
No ICM transmission received for 08/27/2015 after reminder call.  Next ICM transmission scheduled for 09/14/2015.  Patient letter sent with new transmission date.

## 2015-09-10 ENCOUNTER — Other Ambulatory Visit: Payer: Self-pay | Admitting: Student in an Organized Health Care Education/Training Program

## 2015-09-14 ENCOUNTER — Telehealth: Payer: Self-pay | Admitting: Cardiology

## 2015-09-14 NOTE — Telephone Encounter (Signed)
Attempted to confirm remote transmission with pt. No answer and was unable to leave a message.   

## 2015-09-17 NOTE — Progress Notes (Signed)
No ICM transmission sent for 09/14/2015.  Next scheduled transmission 10/07/2015.  Patient letter sent with transmission date.

## 2015-09-29 ENCOUNTER — Other Ambulatory Visit: Payer: Self-pay | Admitting: Internal Medicine

## 2015-10-07 ENCOUNTER — Telehealth: Payer: Self-pay | Admitting: Cardiology

## 2015-10-07 NOTE — Telephone Encounter (Signed)
LMOVM reminding pt to send remote transmission.   

## 2015-10-15 NOTE — Progress Notes (Unsigned)
No remote ICM transmission received for scheduled transmission on 10/07/2015 after reminder call.  Next scheduled remote transmission for 11/10/2015 and letter sent with new date.

## 2015-10-18 DIAGNOSIS — H401132 Primary open-angle glaucoma, bilateral, moderate stage: Secondary | ICD-10-CM | POA: Diagnosis not present

## 2015-10-18 DIAGNOSIS — H1089 Other conjunctivitis: Secondary | ICD-10-CM | POA: Diagnosis not present

## 2015-10-18 DIAGNOSIS — H10023 Other mucopurulent conjunctivitis, bilateral: Secondary | ICD-10-CM | POA: Diagnosis not present

## 2015-10-18 DIAGNOSIS — Z961 Presence of intraocular lens: Secondary | ICD-10-CM | POA: Diagnosis not present

## 2015-10-25 DIAGNOSIS — D3102 Benign neoplasm of left conjunctiva: Secondary | ICD-10-CM | POA: Diagnosis not present

## 2015-10-25 DIAGNOSIS — Z961 Presence of intraocular lens: Secondary | ICD-10-CM | POA: Diagnosis not present

## 2015-10-25 DIAGNOSIS — H16143 Punctate keratitis, bilateral: Secondary | ICD-10-CM | POA: Diagnosis not present

## 2015-10-26 ENCOUNTER — Telehealth: Payer: Self-pay

## 2015-10-26 NOTE — Telephone Encounter (Signed)
Remote ICM transmission received today.  Attempted patient call and left message for return call.  Next remote ICM transmission scheduled for 11/10/2015 and patient letter sent on 11/15/2015   Received voice mail message from patient stating he was sending a transmission as requested.  Last ICM remote transmission was scheduled for 10/07/2015.

## 2015-11-10 ENCOUNTER — Telehealth: Payer: Self-pay | Admitting: Cardiology

## 2015-11-10 DIAGNOSIS — R0602 Shortness of breath: Secondary | ICD-10-CM | POA: Diagnosis not present

## 2015-11-10 NOTE — Telephone Encounter (Signed)
LMOVM reminding pt to send remote transmission.   

## 2015-11-11 ENCOUNTER — Encounter (HOSPITAL_COMMUNITY): Payer: Self-pay | Admitting: Emergency Medicine

## 2015-11-11 ENCOUNTER — Inpatient Hospital Stay (HOSPITAL_COMMUNITY): Payer: Commercial Managed Care - HMO

## 2015-11-11 ENCOUNTER — Emergency Department (HOSPITAL_COMMUNITY): Payer: Commercial Managed Care - HMO

## 2015-11-11 ENCOUNTER — Inpatient Hospital Stay (HOSPITAL_COMMUNITY)
Admission: EM | Admit: 2015-11-11 | Discharge: 2015-11-15 | DRG: 291 | Disposition: A | Discharge status: Home Health | Payer: Commercial Managed Care - HMO | Attending: Student in an Organized Health Care Education/Training Program | Admitting: Student in an Organized Health Care Education/Training Program

## 2015-11-11 DIAGNOSIS — Z955 Presence of coronary angioplasty implant and graft: Secondary | ICD-10-CM

## 2015-11-11 DIAGNOSIS — K649 Unspecified hemorrhoids: Secondary | ICD-10-CM | POA: Diagnosis present

## 2015-11-11 DIAGNOSIS — N183 Chronic kidney disease, stage 3 unspecified: Secondary | ICD-10-CM | POA: Diagnosis present

## 2015-11-11 DIAGNOSIS — E1122 Type 2 diabetes mellitus with diabetic chronic kidney disease: Secondary | ICD-10-CM | POA: Diagnosis present

## 2015-11-11 DIAGNOSIS — R338 Other retention of urine: Secondary | ICD-10-CM

## 2015-11-11 DIAGNOSIS — I272 Other secondary pulmonary hypertension: Secondary | ICD-10-CM | POA: Diagnosis present

## 2015-11-11 DIAGNOSIS — Z7982 Long term (current) use of aspirin: Secondary | ICD-10-CM

## 2015-11-11 DIAGNOSIS — Z87891 Personal history of nicotine dependence: Secondary | ICD-10-CM | POA: Diagnosis not present

## 2015-11-11 DIAGNOSIS — I5043 Acute on chronic combined systolic (congestive) and diastolic (congestive) heart failure: Secondary | ICD-10-CM | POA: Diagnosis present

## 2015-11-11 DIAGNOSIS — R05 Cough: Secondary | ICD-10-CM | POA: Diagnosis not present

## 2015-11-11 DIAGNOSIS — Z79899 Other long term (current) drug therapy: Secondary | ICD-10-CM | POA: Diagnosis not present

## 2015-11-11 DIAGNOSIS — I509 Heart failure, unspecified: Secondary | ICD-10-CM | POA: Diagnosis not present

## 2015-11-11 DIAGNOSIS — Z95810 Presence of automatic (implantable) cardiac defibrillator: Secondary | ICD-10-CM | POA: Diagnosis not present

## 2015-11-11 DIAGNOSIS — E1151 Type 2 diabetes mellitus with diabetic peripheral angiopathy without gangrene: Secondary | ICD-10-CM

## 2015-11-11 DIAGNOSIS — I350 Nonrheumatic aortic (valve) stenosis: Secondary | ICD-10-CM | POA: Diagnosis present

## 2015-11-11 DIAGNOSIS — I130 Hypertensive heart and chronic kidney disease with heart failure and stage 1 through stage 4 chronic kidney disease, or unspecified chronic kidney disease: Principal | ICD-10-CM | POA: Diagnosis present

## 2015-11-11 DIAGNOSIS — I5023 Acute on chronic systolic (congestive) heart failure: Secondary | ICD-10-CM | POA: Diagnosis present

## 2015-11-11 DIAGNOSIS — R0602 Shortness of breath: Secondary | ICD-10-CM | POA: Diagnosis not present

## 2015-11-11 DIAGNOSIS — E785 Hyperlipidemia, unspecified: Secondary | ICD-10-CM | POA: Diagnosis present

## 2015-11-11 DIAGNOSIS — I252 Old myocardial infarction: Secondary | ICD-10-CM

## 2015-11-11 DIAGNOSIS — N170 Acute kidney failure with tubular necrosis: Secondary | ICD-10-CM

## 2015-11-11 DIAGNOSIS — I110 Hypertensive heart disease with heart failure: Secondary | ICD-10-CM | POA: Diagnosis not present

## 2015-11-11 DIAGNOSIS — E1165 Type 2 diabetes mellitus with hyperglycemia: Secondary | ICD-10-CM | POA: Diagnosis not present

## 2015-11-11 DIAGNOSIS — I4581 Long QT syndrome: Secondary | ICD-10-CM | POA: Diagnosis present

## 2015-11-11 DIAGNOSIS — Z794 Long term (current) use of insulin: Secondary | ICD-10-CM | POA: Diagnosis not present

## 2015-11-11 DIAGNOSIS — Z8673 Personal history of transient ischemic attack (TIA), and cerebral infarction without residual deficits: Secondary | ICD-10-CM | POA: Diagnosis not present

## 2015-11-11 DIAGNOSIS — I259 Chronic ischemic heart disease, unspecified: Secondary | ICD-10-CM | POA: Diagnosis not present

## 2015-11-11 DIAGNOSIS — M13851 Other specified arthritis, right hip: Secondary | ICD-10-CM | POA: Diagnosis present

## 2015-11-11 DIAGNOSIS — I4510 Unspecified right bundle-branch block: Secondary | ICD-10-CM | POA: Diagnosis not present

## 2015-11-11 DIAGNOSIS — N289 Disorder of kidney and ureter, unspecified: Secondary | ICD-10-CM

## 2015-11-11 DIAGNOSIS — R9431 Abnormal electrocardiogram [ECG] [EKG]: Secondary | ICD-10-CM | POA: Diagnosis present

## 2015-11-11 DIAGNOSIS — N179 Acute kidney failure, unspecified: Secondary | ICD-10-CM | POA: Insufficient documentation

## 2015-11-11 DIAGNOSIS — N281 Cyst of kidney, acquired: Secondary | ICD-10-CM | POA: Diagnosis not present

## 2015-11-11 DIAGNOSIS — J9601 Acute respiratory failure with hypoxia: Secondary | ICD-10-CM | POA: Diagnosis not present

## 2015-11-11 DIAGNOSIS — I2510 Atherosclerotic heart disease of native coronary artery without angina pectoris: Secondary | ICD-10-CM | POA: Diagnosis present

## 2015-11-11 DIAGNOSIS — B957 Other staphylococcus as the cause of diseases classified elsewhere: Secondary | ICD-10-CM | POA: Diagnosis present

## 2015-11-11 DIAGNOSIS — Z7951 Long term (current) use of inhaled steroids: Secondary | ICD-10-CM

## 2015-11-11 DIAGNOSIS — N390 Urinary tract infection, site not specified: Secondary | ICD-10-CM | POA: Diagnosis present

## 2015-11-11 DIAGNOSIS — K219 Gastro-esophageal reflux disease without esophagitis: Secondary | ICD-10-CM | POA: Diagnosis present

## 2015-11-11 DIAGNOSIS — I255 Ischemic cardiomyopathy: Secondary | ICD-10-CM | POA: Diagnosis present

## 2015-11-11 DIAGNOSIS — I10 Essential (primary) hypertension: Secondary | ICD-10-CM | POA: Diagnosis present

## 2015-11-11 DIAGNOSIS — J449 Chronic obstructive pulmonary disease, unspecified: Secondary | ICD-10-CM | POA: Diagnosis present

## 2015-11-11 LAB — RENAL FUNCTION PANEL
Albumin: 3.2 g/dL — ABNORMAL LOW (ref 3.5–5.0)
Anion gap: 8 (ref 5–15)
BUN: 11 mg/dL (ref 6–20)
CHLORIDE: 106 mmol/L (ref 101–111)
CO2: 26 mmol/L (ref 22–32)
CREATININE: 1.42 mg/dL — AB (ref 0.61–1.24)
Calcium: 9.3 mg/dL (ref 8.9–10.3)
GFR calc Af Amer: 52 mL/min — ABNORMAL LOW (ref >60)
GFR, EST NON AFRICAN AMERICAN: 44 mL/min — AB (ref >60)
Glucose, Bld: 262 mg/dL — ABNORMAL HIGH (ref 65–99)
Phosphorus: 3.2 mg/dL (ref 2.5–4.6)
Potassium: 4.2 mmol/L (ref 3.5–5.1)
Sodium: 140 mmol/L (ref 135–145)

## 2015-11-11 LAB — GLUCOSE, CAPILLARY
GLUCOSE-CAPILLARY: 214 mg/dL — AB (ref 65–99)
GLUCOSE-CAPILLARY: 195 mg/dL — AB (ref 65–99)
Glucose-Capillary: 240 mg/dL — ABNORMAL HIGH (ref 65–99)
Glucose-Capillary: 146 mg/dL — ABNORMAL HIGH (ref 65–99)

## 2015-11-11 LAB — LIPID PANEL
CHOLESTEROL: 133 mg/dL (ref 0–200)
HDL: 55 mg/dL (ref >40)
LDL CALC: 61 mg/dL (ref 0–99)
Total CHOL/HDL Ratio: 2.4 RATIO
Triglycerides: 85 mg/dL (ref <150)
VLDL: 17 mg/dL (ref 0–40)

## 2015-11-11 LAB — HEPATIC FUNCTION PANEL
ALBUMIN: 3.6 g/dL (ref 3.5–5.0)
ALK PHOS: 72 U/L (ref 38–126)
ALT: 16 U/L — AB (ref 17–63)
AST: 23 U/L (ref 15–41)
Bilirubin, Direct: 0.2 mg/dL (ref 0.1–0.5)
Indirect Bilirubin: 0.2 mg/dL — ABNORMAL LOW (ref 0.3–0.9)
TOTAL PROTEIN: 7.4 g/dL (ref 6.5–8.1)
Total Bilirubin: 0.4 mg/dL (ref 0.3–1.2)

## 2015-11-11 LAB — URINALYSIS, ROUTINE W REFLEX MICROSCOPIC
BILIRUBIN URINE: NEGATIVE
Glucose, UA: 100 mg/dL — AB
HGB URINE DIPSTICK: NEGATIVE
KETONES UR: NEGATIVE mg/dL
Nitrite: NEGATIVE
PROTEIN: NEGATIVE mg/dL
SPECIFIC GRAVITY, URINE: 1.007 (ref 1.005–1.030)
pH: 7 (ref 5.0–8.0)

## 2015-11-11 LAB — CBC
HCT: 46.6 % (ref 39.0–52.0)
HEMOGLOBIN: 14.8 g/dL (ref 13.0–17.0)
MCH: 29.5 pg (ref 26.0–34.0)
MCHC: 31.8 g/dL (ref 30.0–36.0)
MCV: 93 fL (ref 78.0–100.0)
Platelets: 187 10*3/uL (ref 150–400)
RBC: 5.01 MIL/uL (ref 4.22–5.81)
RDW: 13.9 % (ref 11.5–15.5)
WBC: 8.1 10*3/uL (ref 4.0–10.5)

## 2015-11-11 LAB — URINE MICROSCOPIC-ADD ON
RBC / HPF: NONE SEEN RBC/hpf (ref 0–5)
WBC, UA: NONE SEEN WBC/hpf (ref 0–5)

## 2015-11-11 LAB — TROPONIN I
TROPONIN I: 0.24 ng/mL — AB (ref <0.031)
TROPONIN I: 0.36 ng/mL — AB (ref <0.031)
TROPONIN I: 0.46 ng/mL — AB (ref <0.031)
Troponin I: 0.39 ng/mL — ABNORMAL HIGH (ref <0.031)

## 2015-11-11 LAB — I-STAT ARTERIAL BLOOD GAS, ED
ACID-BASE DEFICIT: 1 mmol/L (ref 0.0–2.0)
Bicarbonate: 24.6 mEq/L — ABNORMAL HIGH (ref 20.0–24.0)
O2 SAT: 95 %
PH ART: 7.359 (ref 7.350–7.450)
TCO2: 26 mmol/L (ref 0–100)
pCO2 arterial: 43.4 mmHg (ref 35.0–45.0)
pO2, Arterial: 79 mmHg — ABNORMAL LOW (ref 80.0–100.0)

## 2015-11-11 LAB — I-STAT TROPONIN, ED: TROPONIN I, POC: 0.02 ng/mL (ref 0.00–0.08)

## 2015-11-11 LAB — BASIC METABOLIC PANEL
ANION GAP: 9 (ref 5–15)
BUN: 10 mg/dL (ref 6–20)
CO2: 24 mmol/L (ref 22–32)
Calcium: 9.8 mg/dL (ref 8.9–10.3)
Chloride: 107 mmol/L (ref 101–111)
Creatinine, Ser: 1.42 mg/dL — ABNORMAL HIGH (ref 0.61–1.24)
GFR calc non Af Amer: 44 mL/min — ABNORMAL LOW (ref >60)
GFR, EST AFRICAN AMERICAN: 52 mL/min — AB (ref >60)
Glucose, Bld: 264 mg/dL — ABNORMAL HIGH (ref 65–99)
Potassium: 4.7 mmol/L (ref 3.5–5.1)
SODIUM: 140 mmol/L (ref 135–145)

## 2015-11-11 LAB — MRSA PCR SCREENING: MRSA by PCR: NEGATIVE

## 2015-11-11 LAB — BRAIN NATRIURETIC PEPTIDE: B Natriuretic Peptide: 383 pg/mL — ABNORMAL HIGH (ref 0.0–100.0)

## 2015-11-11 LAB — MAGNESIUM: MAGNESIUM: 1.4 mg/dL — AB (ref 1.7–2.4)

## 2015-11-11 MED ORDER — INSULIN GLARGINE 100 UNIT/ML ~~LOC~~ SOLN: 25.0000 [IU] | Freq: Every day | SUBCUTANEOUS | Status: DC

## 2015-11-11 MED ORDER — SODIUM CHLORIDE 0.9% FLUSH: 3.0000 mL | INTRAVENOUS | Status: DC | PRN

## 2015-11-11 MED ORDER — SENNOSIDES-DOCUSATE SODIUM 8.6-50 MG PO TABS: 1.0000 | ORAL_TABLET | Freq: Every evening | ORAL | Status: DC | PRN

## 2015-11-11 MED ORDER — ASPIRIN EC 81 MG PO TBEC
81.0000 mg | DELAYED_RELEASE_TABLET | Freq: Every day | ORAL | Status: DC
  Administered 2015-11-11 – 2015-11-15 (×5): 81 mg via ORAL
  Filled 2015-11-11 (×5): qty 1

## 2015-11-11 MED ORDER — INFLUENZA VAC SPLIT QUAD 0.5 ML IM SUSY: 0.5000 mL | PREFILLED_SYRINGE | INTRAMUSCULAR | Status: DC | PRN

## 2015-11-11 MED ORDER — INSULIN ASPART 100 UNIT/ML ~~LOC~~ SOLN
0.0000 [IU] | Freq: Three times a day (TID) | SUBCUTANEOUS | Status: DC
  Administered 2015-11-11: 5 [IU] via SUBCUTANEOUS
  Administered 2015-11-11: 4 [IU] via SUBCUTANEOUS
  Administered 2015-11-11 – 2015-11-12 (×2): 5 [IU] via SUBCUTANEOUS
  Administered 2015-11-12: 3 [IU] via SUBCUTANEOUS
  Administered 2015-11-12: 5 [IU] via SUBCUTANEOUS
  Administered 2015-11-13: 8 [IU] via SUBCUTANEOUS
  Administered 2015-11-13: 3 [IU] via SUBCUTANEOUS
  Administered 2015-11-14: 5 [IU] via SUBCUTANEOUS
  Administered 2015-11-14: 3 [IU] via SUBCUTANEOUS
  Administered 2015-11-14: 5 [IU] via SUBCUTANEOUS
  Administered 2015-11-15: 2 [IU] via SUBCUTANEOUS

## 2015-11-11 MED ORDER — PERFLUTREN LIPID MICROSPHERE
1.0000 mL | INTRAVENOUS | Status: AC | PRN
  Administered 2015-11-11: 2 mL via INTRAVENOUS
  Filled 2015-11-11: qty 10

## 2015-11-11 MED ORDER — NITROGLYCERIN IN D5W 200-5 MCG/ML-% IV SOLN
10.0000 ug/min | INTRAVENOUS | Status: DC
  Administered 2015-11-11: 10 ug/min via INTRAVENOUS
  Administered 2015-11-11 (×2): 150 ug/min via INTRAVENOUS
  Administered 2015-11-12: 160 ug/min via INTRAVENOUS
  Filled 2015-11-11 (×4): qty 250

## 2015-11-11 MED ORDER — LISINOPRIL 10 MG PO TABS: 10.0000 mg | ORAL_TABLET | Freq: Every day | ORAL | Status: DC

## 2015-11-11 MED ORDER — PERFLUTREN LIPID MICROSPHERE
1.0000 mL | INTRAVENOUS | Status: DC | PRN
  Filled 2015-11-11: qty 10

## 2015-11-11 MED ORDER — PANTOPRAZOLE SODIUM 40 MG PO TBEC
40.0000 mg | DELAYED_RELEASE_TABLET | Freq: Every day | ORAL | Status: DC
  Administered 2015-11-11 – 2015-11-15 (×5): 40 mg via ORAL
  Filled 2015-11-11 (×5): qty 1

## 2015-11-11 MED ORDER — FUROSEMIDE 10 MG/ML IJ SOLN
40.0000 mg | Freq: Once | INTRAMUSCULAR | Status: AC
  Administered 2015-11-11: 40 mg via INTRAVENOUS
  Filled 2015-11-11: qty 4

## 2015-11-11 MED ORDER — ACETAMINOPHEN 325 MG PO TABS
650.0000 mg | ORAL_TABLET | Freq: Four times a day (QID) | ORAL | Status: DC | PRN
  Administered 2015-11-11 – 2015-11-13 (×2): 650 mg via ORAL
  Filled 2015-11-11 (×2): qty 2

## 2015-11-11 MED ORDER — SODIUM CHLORIDE 0.9% FLUSH
3.0000 mL | Freq: Two times a day (BID) | INTRAVENOUS | Status: DC
Start: 2015-11-11 — End: 2015-11-12
  Administered 2015-11-11 – 2015-11-12 (×4): 3 mL via INTRAVENOUS

## 2015-11-11 MED ORDER — ACETAMINOPHEN 650 MG RE SUPP: 650.0000 mg | Freq: Four times a day (QID) | RECTAL | Status: DC | PRN

## 2015-11-11 MED ORDER — CARVEDILOL 25 MG PO TABS
25.0000 mg | ORAL_TABLET | Freq: Two times a day (BID) | ORAL | Status: DC
  Administered 2015-11-11 – 2015-11-15 (×9): 25 mg via ORAL
  Filled 2015-11-11 (×9): qty 1

## 2015-11-11 MED ORDER — SODIUM CHLORIDE 0.9 % IV SOLN: 250.0000 mL | INTRAVENOUS | Status: DC | PRN

## 2015-11-11 MED ORDER — LISINOPRIL 5 MG PO TABS
5.0000 mg | ORAL_TABLET | ORAL | Status: AC
  Administered 2015-11-11: 5 mg via ORAL
  Filled 2015-11-11: qty 1

## 2015-11-11 MED ORDER — LISINOPRIL 5 MG PO TABS
5.0000 mg | ORAL_TABLET | Freq: Every day | ORAL | Status: DC
  Administered 2015-11-11: 5 mg via ORAL
  Filled 2015-11-11: qty 1

## 2015-11-11 MED ORDER — INSULIN GLARGINE 100 UNIT/ML ~~LOC~~ SOLN
20.0000 [IU] | Freq: Every day | SUBCUTANEOUS | Status: DC
  Filled 2015-11-11 (×2): qty 0.2

## 2015-11-11 MED ORDER — ENOXAPARIN SODIUM 40 MG/0.4ML ~~LOC~~ SOLN
40.0000 mg | Freq: Every day | SUBCUTANEOUS | Status: DC
  Administered 2015-11-11 – 2015-11-12 (×2): 40 mg via SUBCUTANEOUS
  Filled 2015-11-11 (×2): qty 0.4

## 2015-11-11 MED ORDER — FUROSEMIDE 10 MG/ML IJ SOLN: 80.0000 mg | Freq: Two times a day (BID) | INTRAMUSCULAR | Status: DC

## 2015-11-11 MED ORDER — ONDANSETRON HCL 4 MG PO TABS: 4.0000 mg | ORAL_TABLET | Freq: Four times a day (QID) | ORAL | Status: DC | PRN

## 2015-11-11 MED ORDER — PROMETHAZINE HCL 25 MG/ML IJ SOLN
12.5000 mg | Freq: Four times a day (QID) | INTRAMUSCULAR | Status: DC | PRN
  Administered 2015-11-11: 12.5 mg via INTRAVENOUS
  Filled 2015-11-11: qty 1

## 2015-11-11 MED ORDER — INSULIN GLARGINE 100 UNIT/ML ~~LOC~~ SOLN: 20.0000 [IU] | Freq: Every day | SUBCUTANEOUS | Status: DC

## 2015-11-11 MED ORDER — SIMVASTATIN 40 MG PO TABS
40.0000 mg | ORAL_TABLET | Freq: Every day | ORAL | Status: DC
  Administered 2015-11-11 – 2015-11-15 (×5): 40 mg via ORAL
  Filled 2015-11-11 (×5): qty 1

## 2015-11-11 MED ORDER — SODIUM CHLORIDE 0.9% FLUSH
3.0000 mL | Freq: Two times a day (BID) | INTRAVENOUS | Status: DC
  Administered 2015-11-11 – 2015-11-15 (×6): 3 mL via INTRAVENOUS

## 2015-11-11 MED ORDER — MAGNESIUM SULFATE 2 GM/50ML IV SOLN
2.0000 g | Freq: Once | INTRAVENOUS | Status: AC
  Administered 2015-11-11: 2 g via INTRAVENOUS
  Filled 2015-11-11: qty 50

## 2015-11-11 MED ORDER — FUROSEMIDE 10 MG/ML IJ SOLN
80.0000 mg | Freq: Two times a day (BID) | INTRAMUSCULAR | Status: DC
  Administered 2015-11-11 (×2): 80 mg via INTRAVENOUS
  Filled 2015-11-11 (×2): qty 8

## 2015-11-11 MED ORDER — ONDANSETRON HCL 4 MG/2ML IJ SOLN: 4.0000 mg | Freq: Four times a day (QID) | INTRAMUSCULAR | Status: DC | PRN

## 2015-11-11 MED ORDER — LISINOPRIL 10 MG PO TABS
10.0000 mg | ORAL_TABLET | Freq: Every day | ORAL | Status: DC
  Administered 2015-11-11: 10 mg via ORAL

## 2015-11-11 MED ORDER — ALBUTEROL SULFATE (2.5 MG/3ML) 0.083% IN NEBU: 5.0000 mg | INHALATION_SOLUTION | Freq: Four times a day (QID) | RESPIRATORY_TRACT | Status: DC | PRN

## 2015-11-11 NOTE — Progress Notes (Signed)
Called internal medicine resident Marlowe Sax, aware patient may not make it down to xray for Chest 2 view, also urine is pink tinged. No new orders at this time. Will continue to monitor closely.

## 2015-11-11 NOTE — ED Notes (Signed)
Pt. arrived with EMS from home reports worsening SOB with productive cough and chest congestion  onset this evening , pt. received 2 NTG sl prior to arrival and CPAP ,  history of CHF , no fever or chills.

## 2015-11-11 NOTE — Progress Notes (Signed)
Spoke with Dr. Redmond Pulling in ref to patients BP, MD states it is ok to titrate nitro gtt off BO should come down as we pull more fluid off. Will continue to monitor patient.

## 2015-11-11 NOTE — Progress Notes (Signed)
  Echocardiogram 2D Echocardiogram has been performed.  [name] 11/11/2015, 4:52 PM

## 2015-11-11 NOTE — Progress Notes (Signed)
Subjective: [name] reports feeling much better this morning. His breathing is improved and denies any further cough. No chest pain. He reports he is compliant with his medications at home.   Objective: Vital signs in last 24 hours: Filed Vitals:   11/11/15 0457 11/11/15 0500 11/11/15 0600 11/11/15 0813  BP:  233/116 192/91 167/97  Pulse:  85 75 79  Temp:  98 F (36.7 C)  98.1 F (36.7 C)  TempSrc:  Oral  Oral  Resp:  17 20 28   Weight: 168 lb 6.9 oz (76.4 kg)     SpO2:  99% 96% 94%   Weight change:   Intake/Output Summary (Last 24 hours) at 11/11/15 0956 Last data filed at 11/11/15 0600  Gross per 24 hour  Intake  11.85 ml  Output    800 ml  Net -788.15 ml     Physical Exam General: alert, elderly male resting comfortably in bed in no acute distress Head: normocephalic and atraumatic.  Neck: supple, full ROM, no thyromegaly, no JVD and no carotid bruits Lungs: normal respiratory effort, no accessory muscle use, scattered crackles  Heart: normal rate, regular rhythm, no murmur, no gallop, and no rub.  Abdomen: soft, non-tender, normal bowel sounds, no distention, no guarding Extremities: 3+ pitting edema to knees bilaterally Skin: turgor normal and no rashes.   Medications: I have reviewed the patient's current medications. Scheduled Meds: . aspirin EC  81 mg Oral Daily  . carvedilol  25 mg Oral BID WC  . enoxaparin (LOVENOX) injection  40 mg Subcutaneous Daily  . furosemide  80 mg Intravenous BID  . insulin aspart  0-15 Units Subcutaneous TID WC  . insulin glargine  20 Units Subcutaneous QHS  . lisinopril  5 mg Oral Daily  . pantoprazole  40 mg Oral Daily  . simvastatin  40 mg Oral Daily  . sodium chloride flush  3 mL Intravenous Q12H  . sodium chloride flush  3 mL Intravenous Q12H   Continuous Infusions: . nitroGLYCERIN 20 mcg/min (11/11/15 0517)   PRN Meds:.sodium chloride, acetaminophen **OR** acetaminophen, albuterol, Influenza vac split quadrivalent  PF, promethazine, senna-docusate, sodium chloride flush Assessment/Plan:  Acute on chronic combined systolic and diastolic congestive heart failure: ECHO from 11/19/2014 showed EF of 20-25%. Received 120 mg IV lasix since admission. 800 mL diuresis documented. 3+ pitting edema noted on physical exam. Mild scattered crackles on exam.  Patient admits to dietary indiscretion.  -IV Lasix 80 mg twice a day -Will wean off nitroglycerin drip -Supplemental oxygen as needed  -Strict I&O's, daily weights -Echo ordered  -am labs pending -PT eval and treat  Coronary artery disease  -Continue home medication aspirin 81 mg daily -Continue home medication carvedilol 25 mg twice daily -Restart home medication Lisinopril 5 mg daily   COPD -Albuterol nebulizer 5 mg every 6 hours as needed  DM2 - A1c 8.8 in July 2016. Blood glucose 240 this morning. Patient takes Lantus 30 units at bedtime at home. -Sliding-scale insulin -Lantus 20 units at bedtime -CBG 4 times a day  Hypertension -Continue home medication carvedilol 25 mg twice daily -Continue home medication Lisinopril 5 mg daily   Hyperlipidemia  -Continue home medication Simvastatin 40 mg daily  History of ischemic cardiomyopathy - Echo from January 2016 showing left ventricular ejection fraction of 123456, grade 1 diastolic dysfunction, mild aortic stenosis, and a large pericardial effusion.  -Follow up repeat Echo   [name] stage III - SCr 1.42 (improved from baseline).  -am labs pending  GERD -Continue home medication Protonix 40 mg daily  Diet: sodium restriction diet  DVT prophylaxis: Lovenox  Code: Full  Dispo: Disposition is deferred at this time, awaiting improvement of current medical problems.  Anticipated discharge in approximately 1-2 day(s).   The patient does have a current PCP [name], MD) and does need an [facility] hospital follow-up appointment after discharge.  The patient does not have transportation  limitations that hinder transportation to clinic appointments.  .Services Needed at time of discharge: Y = Yes, Blank = No PT:   OT:   RN:   Equipment:   Other:     LOS: 0 days   Maryellen Pile, MD IMTS PGY-1 [phone] 11/11/2015, 9:56 AM

## 2015-11-11 NOTE — Care Management Note (Addendum)
Case Management Note  Patient Details  Name: [name] MRN: [id number] Date of Birth: 05/12/1931  Subjective/Objective:         Adm w heart failure           Action/Plan: lives at grp home, pcp dr Lalla Brothers  Expected Discharge Date:                  Expected Discharge Plan:     In-House Referral:     Discharge planning Services     Post Acute Care Choice:    Choice offered to:     DME Arranged:    DME Agency:     HH Arranged:    [place] Agency:     Status of Service:     Medicare Important Message Given:    Date Medicare IM Given:    Medicare IM give by:    Date Additional Medicare IM Given:    Additional Medicare Important Message give by:     If discussed at [place] of Stay Meetings, dates discussed:    Additional Comments: ur review done  Lacretia Leigh, RN 11/11/2015, 8:30 AM

## 2015-11-11 NOTE — Progress Notes (Signed)
Troponin 0.46 increased from 0.36. BP continues to be elevated 195/107 Nitro gtt infusing at 171mcg/hr unable to titrate off as previously ordered. Dr Lovena Le notified and made aware. Dr Lovena Le will assess the patients condition and enter orders.

## 2015-11-11 NOTE — Progress Notes (Signed)
Placed patient on BiPAP following arrival. Patient showed a SpO2 of 87% on RA and coarse crackles throughout. Patient is tolerating BiPAP well and RT will continue to monitor.

## 2015-11-11 NOTE — H&P (Signed)
Date: 11/11/2015               Patient Name:  [name] MRN: [id number]  DOB: July 30, 1931 Age / Sex: 82 y.o., male   PCP: Axel Filler, MD         Medical Service: Internal Medicine Teaching Service         Attending Physician: Dr. Axel Filler, MD    First Contact: Dr. Charlynn Grimes  Pager: [phone]  Second Contact: Dr. Posey Pronto  Pager: [phone]       After Hours (After 5p/  First Contact Pager: [phone]  weekends / holidays): Second Contact Pager: [phone]   Chief Complaint: Shortness of breath, cough, swollen legs   History of Present Illness: Patient is a 82 year old male with a past medical history of coronary artery disease, myocardial infarction, RBBB, s/p AICD in 2014, PTCA stent in 2004, ischemic cardiomyopathy, pericardial effusion, combined systolic and diastolic CHF, diabetes, hypertension, hyperlipidemia, [name] stage III, anemia, and TIA presenting to the hospital from home via EMS with a chief complaint of acute onset shortness of breath and cough. Patient states he went to visit his friend yesterday and had pizza. States he experienced acute onset shortness of breath and cough productive of "frothy" sputum when he was trying to get out of his [place] to get into the house around 8 PM last night. Denies having any associated chest pain or dizziness. Also reports having leg swelling and is not sure for how long. Patient states normally he does not have cough or shortness of breath and is not on home oxygen. Reports using one pillow at night to sleep. Reports being compliant with his Lasix prescription (Lasix 40 mg twice daily). Denies having any fevers, chills, recent illness, or flu-like symptoms. Denies any history of blood clots. No other complaints.   Patient received 2 sublingual nitroglycerin tablets and was started on BiPAP in route to the hospital. Upon presentation to the ED, patient was noted to be tachypneic with a respiratory rate of 28. Chest x-ray showing  pulmonary edema and BNP 383. He was given IV furosemide 40 mg in the ED and started on Nitroglycerin gtt. Echo from January 2016 showing left ventricular ejection fraction of 123456, grade 1 diastolic dysfunction, mild aortic stenosis, and a large pericardial effusion.   Meds: Current Facility-Administered Medications  Medication Dose Route Frequency Provider Last Rate Last Dose  . 0.9 %  sodium chloride infusion  250 mL Intravenous PRN Juluis Mire, MD      . acetaminophen (TYLENOL) tablet 650 mg  650 mg Oral Q6H PRN Juluis Mire, MD       Or  . acetaminophen (TYLENOL) suppository 650 mg  650 mg Rectal Q6H PRN Marjan Rabbani, MD      . albuterol (PROVENTIL) (2.5 MG/3ML) 0.083% nebulizer solution 5 mg  5 mg Nebulization Q6H PRN Juluis Mire, MD      . aspirin EC tablet 81 mg  81 mg Oral Daily Marjan Rabbani, MD      . carvedilol (COREG) tablet 25 mg  25 mg Oral BID WC Marjan Rabbani, MD      . enoxaparin (LOVENOX) injection 40 mg  40 mg Subcutaneous Q24H Marjan Rabbani, MD      . furosemide (LASIX) injection 80 mg  80 mg Intravenous BID Marjan Rabbani, MD      . Influenza vac split quadrivalent PF (FLUARIX) injection 0.5 mL  0.5 mL Intramuscular Prior to discharge Juluis Mire, MD      .  insulin aspart (novoLOG) injection 0-15 Units  0-15 Units Subcutaneous TID WC Marjan Rabbani, MD      . insulin glargine (LANTUS) injection 20 Units  20 Units Subcutaneous QHS Marjan Rabbani, MD      . nitroGLYCERIN 50 mg in dextrose 5 % 250 mL (0.2 mg/mL) infusion  10-200 mcg/min Intravenous Titrated Delora Fuel, MD 3 mL/hr at 123456 0246 10 mcg/min at 11/11/15 0246  . pantoprazole (PROTONIX) EC tablet 40 mg  40 mg Oral Daily Marjan Rabbani, MD      . promethazine (PHENERGAN) injection 12.5 mg  12.5 mg Intravenous Q6H PRN Marjan Rabbani, MD      . senna-docusate (Senokot-S) tablet 1 tablet  1 tablet Oral QHS PRN Marjan Rabbani, MD      . simvastatin (ZOCOR) tablet 40 mg  40 mg Oral Daily Marjan Rabbani,  MD      . sodium chloride flush (NS) 0.9 % injection 3 mL  3 mL Intravenous Q12H Marjan Rabbani, MD      . sodium chloride flush (NS) 0.9 % injection 3 mL  3 mL Intravenous Q12H Marjan Rabbani, MD      . sodium chloride flush (NS) 0.9 % injection 3 mL  3 mL Intravenous PRN Juluis Mire, MD        Allergies: Allergies as of 11/11/2015  . (No Known Allergies)   Past Medical History  Diagnosis Date  . Anemia   . Cataracts, bilateral   . Chronic systolic CHF (congestive heart failure) ([place])     a. ischemic CM EF 15-20%;  b. s/p AICD 05/24/04;  c. Echo 7/06: EF 30-40%, mild reduced RVSF  . CAD (coronary artery disease)     a. s/p AMI, s/p PTCA & stent of cx 12/04;  b. LHC (5/05): Proximal LAD 100% with bridging collaterals (CTO), proximal circumflex 20%, mid circumflex 95% ISR, proximal-mid RCA 75%, EF 20% >>PCI: 3.0 x 28 mm Cypher DES to the mid CFX  . Diverticulosis of colon   . Hyperlipidemia   . PVD (peripheral vascular disease) ([place])     s/p L carotid PTCA/stent 2004  . Transient ischemic attack   . Erectile dysfunction   . Hyperkalemia 08/2008    K=5.7   . BBB (bundle branch block)     right  . HTN (hypertension)   . Elevated PSA   . Adenomatous colon polyp 02/14/2012  . ICD (implantable cardiac defibrillator) in place 12-25-2012    MDT CRTD upgrade by Dr Lovena Le  . Pacemaker   . Type II diabetes mellitus ([place])   . Myocardial infarction ([place]) 1990  . Arthritis     "right hip" (12/25/2012)  . Carotid stenosis     a. s/p L carotid stent 2004;  b. Carotid US (09/2014): Bilateral ICA 1-39%, left ECA >59%, normal subclavian bilaterally, occluded left vertebral >> FU 2 years  . Pericardial effusion     Echocardiogram (09/2014): EF 25% with distal anterior, distal inferior, distal lateral and apical akinesis, grade 1 diastolic dysfunction, very mild aortic stenosis (mean 7 mmHg) - this may be depressed due to low EF (2-D images suggest mild to moderate aortic stenosis), large pericardial  effusion, no RA collapse   Past Surgical History  Procedure Laterality Date  . Cardiac defibrillator placement  05/24/2004    Implantation of a MDT single-chamber defibrillator  . Carotid stent  09/11/2003    Percutaneous transluminal angioplasty and stent placement of the left internal carotid artery.  . Biv icd upgrade  12/25/2012  MDT CRTD upgrade by Dr Lovena Le for ischemic cardiomyopathy and worsening conduction system disease  . Cataract extraction w/ intraocular lens  implant, bilateral Bilateral ~ 2010  . Cardiac catheterization  06/2003,  01/2004  . Coronary angioplasty with stent placement  1990    "2" (12/25/2012)  . Bi-ventricular implantable cardioverter defibrillator upgrade N/A 12/25/2012    Procedure: BI-VENTRICULAR IMPLANTABLE CARDIOVERTER DEFIBRILLATOR UPGRADE;  Surgeon: Evans Lance, MD;  Location: [facility] CATH LAB;  Service: Cardiovascular;  Laterality: N/A;  . Lead revision N/A 12/25/2012    Procedure: LEAD REVISION;  Surgeon: Evans Lance, MD;  Location: [facility] CATH LAB;  Service: Cardiovascular;  Laterality: N/A;   Family History  Problem Relation Age of Onset  . Diabetes Mother   . Diabetes Brother   . Heart attack Neg Hx   . Stroke Neg Hx    Social History   Social History  . Marital Status: Widowed    Spouse Name: N/A  . Number of Children: N/A  . Years of Education: 12   Occupational History  . retired    Social History Main Topics  . Smoking status: Former Smoker    Types: Cigarettes    Quit date: 12/27/1967  . Smokeless tobacco: Never Used  . Alcohol Use: No     Comment: 12/25/2012 "quit all alcohol 60 yr ago"  . Drug Use: No  . Sexual Activity: Not on file   Other Topics Concern  . Not on file   Social History Narrative   Single, 2 adult children, daughter in [place], son in [place]: Review of Systems  Constitutional: Negative for fever and chills.  HENT: Negative for sore throat.   Eyes: Negative for pain and discharge.    Respiratory: Positive for cough, sputum production and shortness of breath. Negative for wheezing.   Cardiovascular: Positive for leg swelling. Negative for chest pain, palpitations, orthopnea and PND.  Gastrointestinal: Negative for nausea, vomiting, abdominal pain and diarrhea.  Genitourinary: Negative for dysuria.  Musculoskeletal: Negative for myalgias.  Skin: Negative for rash.  Neurological: Negative for dizziness, sensory change, focal weakness and headaches.    Physical Exam: Blood pressure 128/67, pulse 72, temperature 97.3 F (36.3 C), temperature source Temporal, resp. rate 16, SpO2 99 %. Physical Exam  Constitutional: He is oriented to person, place, and time. No distress.  Elderly African-American male lying comfortably in hospital stretcher.   HENT:  Head: Normocephalic and atraumatic.  Dry mouth and lips  Eyes: EOM are normal. Pupils are equal, round, and reactive to light.  Neck: Neck supple. No JVD present. No tracheal deviation present.  Cardiovascular: Normal rate, regular rhythm and intact distal pulses.  Exam reveals no gallop and no friction rub.   No murmur heard. Pulmonary/Chest: Effort normal. He has no wheezes. He has rales.  On nasal cannula. No use of accessory muscles of respiration. Rales noted up to mid lung level bilaterally.  Abdominal: Soft. Bowel sounds are normal. He exhibits no distension. There is no tenderness.  Musculoskeletal: He exhibits edema.  +3 pitting edema of bilateral lower extremities  Neurological: He is alert and oriented to person, place, and time.  Skin: Skin is warm and dry. He is not diaphoretic.  Chronic venous stasis changes of skin noted on bilateral lower extremities.   Psychiatric: He has a normal mood and affect.    Lab results: Basic Metabolic Panel:  Recent Labs  11/11/15 0009  NA 140  K 4.7  CL  107  CO2 24  GLUCOSE 264*  BUN 10  CREATININE 1.42*  CALCIUM 9.8   Liver Function Tests:  Recent Labs   11/11/15 0009  AST 23  ALT 16*  ALKPHOS 72  BILITOT 0.4  PROT 7.4  ALBUMIN 3.6   CBC:  Recent Labs  11/11/15 0009  WBC 8.1  HGB 14.8  HCT 46.6  MCV 93.0  PLT 187   Urine Drug Screen: Drugs of Abuse     Component Value Date/Time   LABOPIA NONE DETECTED 01/16/2015 2126   COCAINSCRNUR NONE DETECTED 01/16/2015 2126   LABBENZ NONE DETECTED 01/16/2015 2126   AMPHETMU NONE DETECTED 01/16/2015 2126   THCU NONE DETECTED 01/16/2015 2126   LABBARB NONE DETECTED 01/16/2015 2126    Imaging results:  Dg Chest Port 1 View  11/11/2015  CLINICAL DATA:  Cough today. Increasing shortness of breath tonight. EXAM: PORTABLE CHEST 1 VIEW COMPARISON:  01/16/2015 FINDINGS: Borderline heart size and pulmonary vascularity. Increased density over the lung bases suggesting edema or pneumonia. No blunting of costophrenic angles. No pneumothorax. Cardiac pacemaker. Degenerative changes in the spine. IMPRESSION: Increased density over the lung bases suggests edema or pneumonia. Borderline heart size. Electronically Signed   By: Lucienne Capers M.D.   On: 11/11/2015 00:34    Other results: EKG: Ventricularly paced rhythm. No significant change compared to prior tracing.  Assessment & Plan by Problem: Principal Problem:   Acute on chronic combined systolic (congestive) and diastolic (congestive) heart failure (HCC) Active Problems:   Diabetes mellitus (HCC)   Hyperlipidemia   Coronary atherosclerosis   Ischemic Cardiomyopathy   Automatic implantable cardioverter-defibrillator in situ   HTN (hypertension)   [name] (chronic kidney disease) stage 3, GFR 30-59 ml/min   Aortic stenosis   Prolonged Q-T interval on ECG  Acute on chronic combined systolic and diastolic congestive heart failure - patient is presenting with acute onset dyspnea, cough, and bilateral lower extremity edema. He was noted to be tachypneic on admission and BNP modestly elevated at 383. Chest x-ray showing pulmonary edema. Physical  exam remarkable for crackles up to mid-lung level bilaterally and +3 bilateral lower extremity edema. Istat Troponin negative. EKG similar to prior tracing; ventricularly paced rhythm. Echo from January 2016 showing left ventricular ejection fraction of 123456, grade 1 diastolic dysfunction, mild aortic stenosis, and a large pericardial effusion. Patient received 2 sublingual nitroglycerin tablets and was started on BiPAP in route to the hospital. He also received IV furosemide 40 mg and was started on nitroglycerin drip in the ED. Currently taking Lasix 40 mg twice a day at home. Patient's CHF exacerbation is likely in the setting of dietary indiscretion as he does report eating pizza yesterday. He has been deescalated to nasal cannula now.  -Admit to stepdown -IV Lasix 80 mg twice a day -Continue nitroglycerin drip to decrease preload -Supplemental oxygen as needed  -Influenza vaccine -Strict I&O's, daily weights -Follow-up a.m. chest x-ray -Echo ordered  -Follow-up a.m. labs: Renal function panel, troponin, magnesium, HIV antibody -PT eval and treat  Coronary artery disease  -Continue home medication aspirin 81 mg daily -Continue home medication carvedilol 25 mg twice daily -HOLD home medication Lisinopril 5 mg daily for now   COPD -Albuterol nebulizer 5 mg every 6 hours as needed  DM2 - A1c 8.8 in July 2016. Blood glucose 264 on admission. Patient takes Lantus 30 units at bedtime at home. -Sliding-scale insulin -Lantus 20 units at bedtime -CBG 4 times a day  Hypertension -Continue home medication carvedilol  25 mg twice daily -Continue home medication Lisinopril 5 mg daily   Hyperlipidemia  -Continue home medication Simvastatin 40 mg daily  History of ischemic cardiomyopathy -  Echo from January 2016 showing left ventricular ejection fraction of 123456, grade 1 diastolic dysfunction, mild aortic stenosis, and a large pericardial effusion.  -Follow up repeat Echo   [name] stage III - SCr  1.42 (improved from baseline).  -Follow-up a.m. renal function panel  GERD -Continue home medication Protonix 40 mg daily  Diet: sodium restriction diet  DVT prophylaxis: Lovenox  Code: Full  Dispo: Disposition is deferred at this time, awaiting improvement of current medical problems. Anticipated discharge in approximately 1-2 day(s).   The patient does have a current PCP [name], MD) and does need an [facility] hospital follow-up appointment after discharge.  The patient does not have transportation limitations that hinder transportation to clinic appointments.  Signed: Shela Leff, MD 11/11/2015, 4:22 AM

## 2015-11-11 NOTE — ED Provider Notes (Signed)
CSN: [id number]     Arrival date & time 11/11/15  0003 History   By signing my name below, I, Forrestine Him, attest that this documentation has been prepared under the direction and in the presence of Delora Fuel, MD.  Electronically Signed: Forrestine Him, ED Scribe. 11/11/2015. 12:18 AM.   Chief Complaint  Patient presents with  . Shortness of Breath   The history is provided by the patient, a relative and the EMS personnel. No language interpreter was used.    HPI Comments: SAAGAR LUTZ brought in by EMS is a 82 y.o. male with a PMHx of CAD, hyperlipidemia, PVD, CHF, TIA, RBBB, HTN, ICD implanted device, DM, and MI who presents to the Emergency Department complaining of constant, ongoing shortness of breath x 1 day. Pt also reports worsening swelling to the lower extremities, ongoing productive cough, and chest congestion. No aggravating or alleviating factors at this time. Pt was given 2 NTG sublingual and was started on a BiPAP en route to department. No recent fever, chills, nausea, vomiting, or chest pain. Pt denies any missed doses of medications.  PCP: Axel Filler, MD    Past Medical History  Diagnosis Date  . Anemia   . Cataracts, bilateral   . Chronic systolic CHF (congestive heart failure) ([place])     a. ischemic CM EF 15-20%;  b. s/p AICD 05/24/04;  c. Echo 7/06: EF 30-40%, mild reduced RVSF  . CAD (coronary artery disease)     a. s/p AMI, s/p PTCA & stent of cx 12/04;  b. LHC (5/05): Proximal LAD 100% with bridging collaterals (CTO), proximal circumflex 20%, mid circumflex 95% ISR, proximal-mid RCA 75%, EF 20% >>PCI: 3.0 x 28 mm Cypher DES to the mid CFX  . Diverticulosis of colon   . Hyperlipidemia   . PVD (peripheral vascular disease) ([place])     s/p L carotid PTCA/stent 2004  . Transient ischemic attack   . Erectile dysfunction   . Hyperkalemia 08/2008    K=5.7   . BBB (bundle branch block)     right  . HTN (hypertension)   . Elevated PSA   . Adenomatous  colon polyp 02/14/2012  . ICD (implantable cardiac defibrillator) in place 12-25-2012    MDT CRTD upgrade by Dr Lovena Le  . Pacemaker   . Type II diabetes mellitus ([place])   . Myocardial infarction ([place]) 1990  . Arthritis     "right hip" (12/25/2012)  . Carotid stenosis     a. s/p L carotid stent 2004;  b. Carotid US (09/2014): Bilateral ICA 1-39%, left ECA >59%, normal subclavian bilaterally, occluded left vertebral >> FU 2 years  . Pericardial effusion     Echocardiogram (09/2014): EF 25% with distal anterior, distal inferior, distal lateral and apical akinesis, grade 1 diastolic dysfunction, very mild aortic stenosis (mean 7 mmHg) - this may be depressed due to low EF (2-D images suggest mild to moderate aortic stenosis), large pericardial effusion, no RA collapse   Past Surgical History  Procedure Laterality Date  . Cardiac defibrillator placement  05/24/2004    Implantation of a MDT single-chamber defibrillator  . Carotid stent  09/11/2003    Percutaneous transluminal angioplasty and stent placement of the left internal carotid artery.  . Biv icd upgrade  12/25/2012    MDT CRTD upgrade by Dr Lovena Le for ischemic cardiomyopathy and worsening conduction system disease  . Cataract extraction w/ intraocular lens  implant, bilateral Bilateral ~ 2010  . Cardiac catheterization  06/2003,  01/2004  . Coronary angioplasty with stent placement  1990    "2" (12/25/2012)  . Bi-ventricular implantable cardioverter defibrillator upgrade N/A 12/25/2012    Procedure: BI-VENTRICULAR IMPLANTABLE CARDIOVERTER DEFIBRILLATOR UPGRADE;  Surgeon: Evans Lance, MD;  Location: [facility] CATH LAB;  Service: Cardiovascular;  Laterality: N/A;  . Lead revision N/A 12/25/2012    Procedure: LEAD REVISION;  Surgeon: Evans Lance, MD;  Location: [facility] CATH LAB;  Service: Cardiovascular;  Laterality: N/A;   Family History  Problem Relation Age of Onset  . Diabetes Mother   . Diabetes Brother   . Heart attack Neg Hx   . Stroke Neg Hx     Social History  Substance Use Topics  . Smoking status: Former Smoker    Types: Cigarettes    Quit date: 12/27/1967  . Smokeless tobacco: Never Used  . Alcohol Use: No     Comment: 12/25/2012 "quit all alcohol 60 yr ago"    Review of Systems  Constitutional: Negative for fever and chills.  HENT: Positive for congestion.   Respiratory: Positive for cough and shortness of breath.   Cardiovascular: Positive for leg swelling. Negative for chest pain.  Gastrointestinal: Negative for nausea, vomiting and abdominal pain.  Neurological: Negative for headaches.  Psychiatric/Behavioral: Negative for confusion.  All other systems reviewed and are negative.     Allergies  Review of patient's allergies indicates no known allergies.  Home Medications   Prior to Admission medications   Medication Sig Start Date End Date Taking? Authorizing Provider  albuterol (VENTOLIN HFA) 108 (90 BASE) MCG/ACT inhaler Inhale 1-2 puffs into the lungs every 4 (four) hours as needed for wheezing or shortness of breath. 01/05/15   [name], MD  aspirin EC 81 MG tablet Take 81 mg by mouth daily.    Historical Provider, MD  B-D ULTRAFINE III SHORT PEN 31G X 8 MM MISC USE AS DIRECTED ONCE DAILY FOR INSULIN INJECTION 09/10/15   Axel Filler, MD  carvedilol (COREG) 25 MG tablet TAKE 1 TABLET BY MOUTH TWICE DAILY WITH A MEAL 05/07/15   Maryellen Pile, MD  furosemide (LASIX) 40 MG tablet Take 1 tablet (40 mg total) by mouth 2 (two) times daily. 11/30/14   Peter M Martinique, MD  Insulin Glargine (LANTUS SOLOSTAR) 100 UNIT/ML Solostar Pen Inject 30 Units into the skin at bedtime. 05/07/15   Maryellen Pile, MD  lisinopril (PRINIVIL,ZESTRIL) 5 MG tablet Take 1 tablet (5 mg total) by mouth daily. 05/07/15   Maryellen Pile, MD  pantoprazole (PROTONIX) 40 MG tablet TAKE 1 TABLET BY MOUTH DAILY 03/02/15   Sid Falcon, MD  simvastatin (ZOCOR) 40 MG tablet TAKE 1 TABLET BY MOUTH DAILY 09/29/15   Axel Filler, MD    Triage Vitals: BP 160/96 mmHg  Pulse 80  Temp(Src) 97.3 F (36.3 C) (Temporal)  Resp 28  SpO2 100%   Physical Exam  Constitutional: He is oriented to person, place, and time. He appears well-developed and well-nourished.  On BPAP  HENT:  Head: Normocephalic and atraumatic.  Eyes: EOM are normal. Pupils are equal, round, and reactive to light.  Neck: Normal range of motion. Neck supple. No JVD present.  Cardiovascular: Normal rate, regular rhythm and normal heart sounds.   No murmur heard. Pulmonary/Chest: No respiratory distress. He has wheezes. He has rales. He exhibits no tenderness.  Bibasilar rales half way up Course expiratory wheezes noted  Abdominal: Soft. Bowel sounds are normal. He exhibits no distension and no mass. There is no  tenderness.  Musculoskeletal: Normal range of motion. He exhibits edema.  3 plus pitting edema with venous changes  Lymphadenopathy:    He has no cervical adenopathy.  Neurological: He is alert and oriented to person, place, and time. No cranial nerve deficit. He exhibits normal muscle tone. Coordination normal.  Skin: Skin is warm and dry. No rash noted.  Psychiatric: He has a normal mood and affect. His behavior is normal. Judgment and thought content normal.  Nursing note and vitals reviewed.   ED Course  Procedures (including critical care time)  DIAGNOSTIC STUDIES: Oxygen Saturation is 100% on RA, Normal by my interpretation.    COORDINATION OF CARE: 12:10 AM- Will order CXR, hepatic function panel, BNP, BMP, CBC, i-stat troponin, and EKG. Discussed treatment plan with pt at bedside and pt agreed to plan.     Labs Review Results for orders placed or performed during the hospital encounter of [id number]  Basic metabolic panel  Result Value Ref Range   Sodium 140 135 - 145 mmol/L   Potassium 4.7 3.5 - 5.1 mmol/L   Chloride 107 101 - 111 mmol/L   CO2 24 22 - 32 mmol/L   Glucose, Bld 264 (H) 65 - 99 mg/dL   BUN 10 6 - 20 mg/dL    Creatinine, Ser 1.42 (H) 0.61 - 1.24 mg/dL   Calcium 9.8 8.9 - 10.3 mg/dL   GFR calc non Af Amer 44 (L) >60 mL/min   GFR calc Af Amer 52 (L) >60 mL/min   Anion gap 9 5 - 15  CBC  Result Value Ref Range   WBC 8.1 4.0 - 10.5 K/uL   RBC 5.01 4.22 - 5.81 MIL/uL   Hemoglobin 14.8 13.0 - 17.0 g/dL   HCT 46.6 39.0 - 52.0 %   MCV 93.0 78.0 - 100.0 fL   MCH 29.5 26.0 - 34.0 pg   MCHC 31.8 30.0 - 36.0 g/dL   RDW 13.9 11.5 - 15.5 %   Platelets 187 150 - 400 K/uL  Hepatic function panel  Result Value Ref Range   Total Protein 7.4 6.5 - 8.1 g/dL   Albumin 3.6 3.5 - 5.0 g/dL   AST 23 15 - 41 U/L   ALT 16 (L) 17 - 63 U/L   Alkaline Phosphatase 72 38 - 126 U/L   Total Bilirubin 0.4 0.3 - 1.2 mg/dL   Bilirubin, Direct 0.2 0.1 - 0.5 mg/dL   Indirect Bilirubin 0.2 (L) 0.3 - 0.9 mg/dL  Brain natriuretic peptide  Result Value Ref Range   B Natriuretic Peptide 383.0 (H) 0.0 - 100.0 pg/mL  I-stat troponin, ED (not at [facility])  Result Value Ref Range   Troponin i, poc 0.02 0.00 - 0.08 ng/mL   Comment 3           Imaging Review Dg Chest Port 1 View  11/11/2015  CLINICAL DATA:  Cough today. Increasing shortness of breath tonight. EXAM: PORTABLE CHEST 1 VIEW COMPARISON:  01/16/2015 FINDINGS: Borderline heart size and pulmonary vascularity. Increased density over the lung bases suggesting edema or pneumonia. No blunting of costophrenic angles. No pneumothorax. Cardiac pacemaker. Degenerative changes in the spine. IMPRESSION: Increased density over the lung bases suggests edema or pneumonia. Borderline heart size. Electronically Signed   By: Lucienne Capers M.D.   On: 11/11/2015 00:34   I have personally reviewed and evaluated these images and lab results as part of my medical decision-making.   EKG Interpretation   Date/Time:  Thursday November 11 2015  00:10:03 EST Ventricular Rate:  83 PR Interval:  151 QRS Duration: 174 QT Interval:  457 QTC Calculation: 537 R Axis:   -104 Text  Interpretation:  VENTRICULAR PACED RHYTHM When compared with ECG of  01/17/2015, No significant change was found Confirmed by [facility]  MD, Kaisha Wachob  ([id number]) on 11/11/2015 12:15:23 AM      CRITICAL CARE Performed by: [id number] Total critical care time: 40 minutes Critical care time was exclusive of separately billable procedures and treating other patients. Critical care was necessary to treat or prevent imminent or life-threatening deterioration. Critical care was time spent personally by me on the following activities: development of treatment plan with patient and/or surrogate as well as nursing, discussions with consultants, evaluation of patient's response to treatment, examination of patient, obtaining history from patient or surrogate, ordering and performing treatments and interventions, ordering and review of laboratory studies, ordering and review of radiographic studies, pulse oximetry and re-evaluation of patient's condition. MDM   Final diagnoses:  Acute on chronic systolic heart failure (HCC)  Renal insufficiency    Dyspnea with peripheral edema and bibasilar rales strongly suggestive of congestive heart failure. Review of old records shows that he does have history of systolic heart failure as well as COPD. Clinical picture seems more consistent with CHF. Chest x-rays obtained and does appear to show some pulmonary edema. He is given furosemide intravenously. Laboratory workup does show elevated BNP of 383. Renal insufficiency is noted that is actually improved over baseline. With above noted treatment, he seems to improved. Blood pressures come down and rest her rate has come down. He looks to be ready to come off of BiPAP at this point. He is started on a nitroglycerin drip. Case is discussed with Dr. [name] Plummer of internal medicine teaching service who agrees to admit the patient. Because of his initial tenuous respiratory status, he will be started in stepdown unit.  I personally  performed the services described in this documentation, which was scribed in my presence. The recorded information has been reviewed and is accurate.      Delora Fuel, MD [id number]

## 2015-11-12 ENCOUNTER — Inpatient Hospital Stay (HOSPITAL_COMMUNITY): Payer: Commercial Managed Care - HMO

## 2015-11-12 DIAGNOSIS — I959 Hypotension, unspecified: Secondary | ICD-10-CM

## 2015-11-12 LAB — BASIC METABOLIC PANEL
ANION GAP: 12 (ref 5–15)
ANION GAP: 14 (ref 5–15)
BUN: 19 mg/dL (ref 6–20)
BUN: 26 mg/dL — ABNORMAL HIGH (ref 6–20)
CALCIUM: 9.5 mg/dL (ref 8.9–10.3)
CHLORIDE: 99 mmol/L — AB (ref 101–111)
CO2: 24 mmol/L (ref 22–32)
CO2: 23 mmol/L (ref 22–32)
Calcium: 9.4 mg/dL (ref 8.9–10.3)
Chloride: 103 mmol/L (ref 101–111)
Creatinine, Ser: 2.68 mg/dL — ABNORMAL HIGH (ref 0.61–1.24)
Creatinine, Ser: 3.43 mg/dL — ABNORMAL HIGH (ref 0.61–1.24)
GFR calc Af Amer: 18 mL/min — ABNORMAL LOW (ref >60)
GFR, EST AFRICAN AMERICAN: 24 mL/min — AB (ref >60)
GFR, EST NON AFRICAN AMERICAN: 21 mL/min — AB (ref >60)
GFR, EST NON AFRICAN AMERICAN: 15 mL/min — AB (ref >60)
GLUCOSE: 189 mg/dL — AB (ref 65–99)
Glucose, Bld: 235 mg/dL — ABNORMAL HIGH (ref 65–99)
POTASSIUM: 4.4 mmol/L (ref 3.5–5.1)
POTASSIUM: 4.6 mmol/L (ref 3.5–5.1)
SODIUM: 136 mmol/L (ref 135–145)
Sodium: 139 mmol/L (ref 135–145)

## 2015-11-12 LAB — HIV ANTIBODY (ROUTINE TESTING W REFLEX): HIV SCREEN 4TH GENERATION: NONREACTIVE

## 2015-11-12 LAB — GLUCOSE, CAPILLARY
GLUCOSE-CAPILLARY: 233 mg/dL — AB (ref 65–99)
GLUCOSE-CAPILLARY: 250 mg/dL — AB (ref 65–99)
GLUCOSE-CAPILLARY: 192 mg/dL — AB (ref 65–99)
Glucose-Capillary: 181 mg/dL — ABNORMAL HIGH (ref 65–99)

## 2015-11-12 LAB — HEMOGLOBIN A1C
HEMOGLOBIN A1C: 9.7 % — AB (ref 4.8–5.6)
Mean Plasma Glucose: 232 mg/dL

## 2015-11-12 LAB — MAGNESIUM: MAGNESIUM: 1.6 mg/dL — AB (ref 1.7–2.4)

## 2015-11-12 LAB — TROPONIN I: TROPONIN I: 0.36 ng/mL — AB (ref <0.031)

## 2015-11-12 MED ORDER — INSULIN GLARGINE 100 UNIT/ML ~~LOC~~ SOLN
25.0000 [IU] | Freq: Every day | SUBCUTANEOUS | Status: DC
  Administered 2015-11-12 – 2015-11-14 (×3): 25 [IU] via SUBCUTANEOUS
  Filled 2015-11-12 (×5): qty 0.25

## 2015-11-12 MED ORDER — ENOXAPARIN SODIUM 30 MG/0.3ML ~~LOC~~ SOLN
30.0000 mg | Freq: Every day | SUBCUTANEOUS | Status: DC
  Administered 2015-11-13 – 2015-11-15 (×3): 30 mg via SUBCUTANEOUS
  Filled 2015-11-12 (×3): qty 0.3

## 2015-11-12 MED ORDER — MAGNESIUM SULFATE 2 GM/50ML IV SOLN
2.0000 g | Freq: Once | INTRAVENOUS | Status: AC
  Administered 2015-11-12: 2 g via INTRAVENOUS
  Filled 2015-11-12: qty 50

## 2015-11-12 NOTE — Progress Notes (Signed)
Internal Medicine Attending:   I saw and examined the patient. I reviewed the resident's note and I agree with the resident's findings and plan as documented in the resident's note.  82 year old man with severe systolic congestive heart failure and ejection fraction of 20%, admitted with volume overload and pulmonary edema. He diuresed fairly well yesterday with IV Lasix formulation. Today he says that he is feeling better, says his breathing is getting back to its baseline. He was in the chair briefly yesterday.   Most notably today on labs, his creatinine is elevated from 1.4-2.4. It looks like there was an unintentional ordering error with his medications yesterday. He takes lisinopril 5 mg daily at home, we intended to increase this yesterday to 10 mg because he was having persistent elevated blood pressures above [id number] systolic. However in ordering it, somehow he ended up receiving two 10 mg doses for a total of 20 mg yesterday. As a result this morning he was hypotensive with systolic briefly a 52, now it's improved to the normal range. Lisinopril was held today, Lasix was held early today. I think it's okay to leave his volume status somewhat even today and then restart oral Lasix either this evening or tomorrow morning. Would check another BMP this afternoon. Needs PT and OT evaluations. Given his general frailty, he'll likely need a subacute rehabilitation stay when he is ready for discharge. I aniticpate his renal function will return to baseline around 2.0 within the next few days; he may be at risk for ATN injury given his hypotension, we will continue to monitor renal function closely.

## 2015-11-12 NOTE — Evaluation (Signed)
Physical Therapy Evaluation Patient Details Name: [name] MRN: [id number] DOB: [birth year] Today's Date: 11/12/2015   History of Present Illness  patient is an 82 yo male with a complex medical history who presents with Acute hypoxic respiratory failure due to systolic CHF exacerbation.  Clinical Impression  Patient demonstrates deficits in functional mobility as indicated below. Will need continued skilled PT to address deficits and maximize function. Will continue to see and progress as tolerated.   OF NOTE: Patient reports complete independence prior to admission and unable to arrange assist. If this remains the case, may need to consider ST SNF pending patient progress as he is modestly unsteady and at increased risk of falls at this time.    Follow Up Recommendations Home health PT;Supervision/Assistance - 24 hour (will need SNF if does not progress to independence)    Equipment Recommendations  Rolling walker with 5" wheels    Recommendations for Other Services       Precautions / Restrictions Precautions Precautions: Fall Restrictions Weight Bearing Restrictions: No      Mobility  Bed Mobility Overal bed mobility: Needs Assistance Bed Mobility: Supine to Sit     Supine to sit: Min  assist      General bed mobility comments: increased time to come to EOB, physical assist needed to pull to sitting  for safety  Transfers Overall transfer level: Needs assistance Equipment used: Rolling walker (2 wheeled) Transfers: Sit to/from Stand Sit to Stand: Min guard         General transfer comment: min guard for stability, some noted posterior lean during initiation of elevation to upright  Ambulation/Gait Ambulation/Gait assistance: Min guard Ambulation Distance (Feet): 40 Feet Assistive device: Rolling walker (2 wheeled) Gait Pattern/deviations: Step-through pattern;Narrow base of support;Drifts right/left Gait velocity: decreased Gait velocity  interpretation: Below normal speed for age/gender General Gait Details: some modest instability noted, especially during turn around  [employer] Mobility    Modified Rankin (Stroke Patients Only)       Balance Overall balance assessment: Needs assistance Sitting-balance support: Feet supported Sitting balance-Leahy Scale: Fair     Standing balance support: During functional activity Standing balance-Leahy Scale: Fair Standing balance comment: patient able ot stand for hygiene activities, reliance on RW, some posterior instability noted requiring ques to fwd posture                             Pertinent Vitals/Pain Pain Assessment: No/denies pain    Home Living Family/patient expects to be discharged to:: Private residence (independent living facility) Living Arrangements: Alone Available Help at Discharge:  (has meals at the cafe M-F no offered on weekends and holidays per patient) Type of Home: Apartment Home Access: Level entry       Home Equipment: None Additional Comments: question reliability of information provided, patient states that he was completely indpendent prior to arrival, and needs to be to return home    Prior Function Level of Independence: Independent         Comments: states no device, but colleague that is familiar with patient states that he uses a walker at times     Hand Dominance   Dominant Hand: Right    Extremity/Trunk Assessment   Upper Extremity Assessment: Overall WFL for tasks assessed           Lower Extremity Assessment: Generalized weakness  Cervical / Trunk Assessment: Kyphotic  Communication   Communication: No difficulties  Cognition Arousal/Alertness: Awake/alert Behavior During Therapy: WFL for tasks assessed/performed Overall Cognitive Status: No family/caregiver present to determine baseline cognitive functioning                      General Comments       Exercises        Assessment/Plan    PT Assessment Patient needs continued PT services  PT Diagnosis Difficulty walking;Abnormality of gait;Generalized weakness   PT Problem List Decreased strength;Decreased activity tolerance;Decreased balance;Decreased mobility;Decreased knowledge of use of DME;Cardiopulmonary status limiting activity  PT Treatment Interventions DME instruction;Gait training;Functional mobility training;Therapeutic activities;Balance training;Patient/family education   PT Goals (Current goals can be found in the Care Plan section) Acute Rehab PT Goals Patient Stated Goal: to go home PT Goal Formulation: With patient Time For Goal Achievement: 11/26/15 Potential to Achieve Goals: Fair    Frequency Min 3X/week   Barriers to discharge Decreased caregiver support      Co-evaluation               End of Session Equipment Utilized During Treatment: Gait belt Activity Tolerance: Patient tolerated treatment well Patient left: in chair;with call bell/phone within reach;with chair alarm set Nurse Communication: Mobility status         Time: 0941-1000 PT Time Calculation (min) (ACUTE ONLY): 19 min   Charges:   PT Evaluation $PT Eval Moderate Complexity: 1 Procedure     PT G CodesDuncan Dull 12/07/15, 10:44 AM Alben Deeds, PT DPT  [phone]

## 2015-11-12 NOTE — Progress Notes (Deleted)
PT Cancellation Note  Patient Details Name: [name] MRN: [id number] DOB: [birth year]   Cancelled Treatment:    Reason Eval/Treat Not Completed: Patient not medically ready (troponins trending up, will hold at this time.)   Duncan Dull 11/12/2015, 7:20 AM Alben Deeds, PT DPT  [phone]

## 2015-11-12 NOTE — Progress Notes (Signed)
Subjective: Patient denies any symptoms this morning. No dyspnea or cough. Denies any dysuria.   Objective: Vital signs in last 24 hours: Filed Vitals:   11/12/15 0800 11/12/15 0900 11/12/15 1000 11/12/15 1115  BP: 122/63 117/61  146/69  Pulse: 75 74    Temp:    98.1 F (36.7 C)  TempSrc:    Oral  Resp: 21 20  18   Weight:      SpO2: 96% 98% 96% 96%   Weight change:   Intake/Output Summary (Last 24 hours) at 11/12/15 1238 Last data filed at 11/12/15 0900  Gross per 24 hour  Intake 719.55 ml  Output    600 ml  Net 119.55 ml     Physical Exam General: alert, elderly male resting comfortably in bed in no acute distress Head: normocephalic and atraumatic.  Neck: supple, full ROM, no thyromegaly, no JVD and no carotid bruits Lungs: normal respiratory effort, no accessory muscle use, no crackles  Heart: normal rate, regular rhythm, no murmur, no gallop, and no rub.  Abdomen: soft, non-tender, normal bowel sounds, no distention, no guarding Extremities: 2+ pitting edema to knees bilaterally Skin: turgor normal and no rashes.   Medications: I have reviewed the patient's current medications. Scheduled Meds: . aspirin EC  81 mg Oral Daily  . carvedilol  25 mg Oral BID WC  . enoxaparin (LOVENOX) injection  40 mg Subcutaneous Daily  . insulin aspart  0-15 Units Subcutaneous TID WC  . insulin glargine  20 Units Subcutaneous QHS  . pantoprazole  40 mg Oral Daily  . simvastatin  40 mg Oral Daily  . sodium chloride flush  3 mL Intravenous Q12H  . sodium chloride flush  3 mL Intravenous Q12H   Continuous Infusions:   PRN Meds:.sodium chloride, acetaminophen **OR** acetaminophen, albuterol, Influenza vac split quadrivalent PF, promethazine, senna-docusate, sodium chloride flush Assessment/Plan:  Acute on chronic combined systolic and diastolic congestive heart failure: ECHO done 2/16 with EF 20-25% stable from ECHO last year. Cr increased today, 1.40>2.68. Received 20 mg of  lisinopril yesterday (5 mg home dose) and Lasix 80 IV bid. Weight improved 168-> 165 lbs. Net neutral output documented yesterday. Patient reports improved symptoms. No JVD present, only faint scattered crackles on exam. LE edema improving from yesterday. CXR unchanged. Will hold lisinopril and lasix today. Recheck BMP this afternoon. Stopped nitro drip this am. Will transfer to the floor.  -D/C IV Lasix 80 mg twice a day -Supplemental oxygen as needed  -Strict I&O's, daily weights -PT eval and treat -D/C lisinopril given AKI  AKI on [name] stage III - SCr 1.42-> 2.68 today.Nursing noted pink tinged urine this morning. Patient denies any urinary symptoms. UA clear at admission. Will continue to monitor.  -D/C Lasix and lisinopril  -BMP this afternoon  Coronary artery disease  -Continue home medication aspirin 81 mg daily -Continue home medication carvedilol 25 mg twice daily -D/C Lisinopril  COPD -Albuterol nebulizer 5 mg every 6 hours as needed  DM2 - CBGs 181-233 today. A1c 8.8 in July 2016. Blood glucose 240 this morning. Patient takes Lantus 30 units at bedtime at home. -Sliding-scale insulin -increase Lantus to 25 units at bedtime -CBG 4 times a day  Hypertension -Continue home medication carvedilol 25 mg twice daily -D/C  Lisinopril 5 mg daily   Hyperlipidemia  -Continue home medication Simvastatin 40 mg daily  History of ischemic cardiomyopathy - Echo done 2/16 shows EF of 20-25% unchanged from January 2016.   GERD -Continue home medication  Protonix 40 mg daily  Diet: sodium restriction diet  DVT prophylaxis: Lovenox  Code: Full  Dispo: Disposition is deferred at this time, awaiting improvement of current medical problems.  Anticipated discharge in approximately 1-2 day(s).   The patient does have a current PCP [name], MD) and does need an [facility] hospital follow-up appointment after discharge.  The patient does not have transportation limitations  that hinder transportation to clinic appointments.  .Services Needed at time of discharge: Y = Yes, Blank = No PT:   OT:   RN:   Equipment:   Other:     LOS: 1 day   Maryellen Pile, MD IMTS PGY-1 [phone] 11/12/2015, 12:38 PM

## 2015-11-12 NOTE — Progress Notes (Signed)
Foley catheter inserted using sterile technique per MD order with 2nd RN at bedside.urine returned immedietely.

## 2015-11-12 NOTE — Progress Notes (Signed)
Inpatient Diabetes Program Recommendations  AACE/ADA: New Consensus Statement on Inpatient Glycemic Control (2015)  Target Ranges:  Prepandial:   less than 140 mg/dL      Peak postprandial:   less than 180 mg/dL (1-2 hours)      Critically ill patients:  140 - 180 mg/dL   Review of Glycemic Control  Results for LIBERO, MATHERS (MRN [id number]) as of 11/12/2015 08:31  Ref. Range 11/11/2015 08:13 11/11/2015 11:36 11/11/2015 16:10 11/11/2015 21:09 11/12/2015 07:44  Glucose-Capillary Latest Ref Range: 65-99 mg/dL 240 (H) 214 (H) 195 (H) 146 (H) 181 (H)    Diabetes history: Type 2 Outpatient Diabetes medications: Lantus 30 units qday Current orders for Inpatient glycemic control: Lantus 20 units qhs, Novolog moderate correction 0-15 units tid  Inpatient Diabetes Program Recommendations:  Consider increasing Lantus to 25 units qhs   Gentry Fitz, RN, [place], [place], CDE Diabetes Coordinator Inpatient Diabetes Program  [phone] (Team Pager) [phone] ([place]) 11/12/2015 8:35 AM

## 2015-11-13 ENCOUNTER — Inpatient Hospital Stay (HOSPITAL_COMMUNITY): Payer: Commercial Managed Care - HMO

## 2015-11-13 DIAGNOSIS — Z8679 Personal history of other diseases of the circulatory system: Secondary | ICD-10-CM

## 2015-11-13 DIAGNOSIS — Z794 Long term (current) use of insulin: Secondary | ICD-10-CM

## 2015-11-13 DIAGNOSIS — N179 Acute kidney failure, unspecified: Secondary | ICD-10-CM | POA: Insufficient documentation

## 2015-11-13 DIAGNOSIS — I2510 Atherosclerotic heart disease of native coronary artery without angina pectoris: Secondary | ICD-10-CM

## 2015-11-13 DIAGNOSIS — E785 Hyperlipidemia, unspecified: Secondary | ICD-10-CM

## 2015-11-13 DIAGNOSIS — K219 Gastro-esophageal reflux disease without esophagitis: Secondary | ICD-10-CM

## 2015-11-13 DIAGNOSIS — E1151 Type 2 diabetes mellitus with diabetic peripheral angiopathy without gangrene: Secondary | ICD-10-CM

## 2015-11-13 DIAGNOSIS — I130 Hypertensive heart and chronic kidney disease with heart failure and stage 1 through stage 4 chronic kidney disease, or unspecified chronic kidney disease: Principal | ICD-10-CM

## 2015-11-13 DIAGNOSIS — N183 Chronic kidney disease, stage 3 (moderate): Secondary | ICD-10-CM

## 2015-11-13 DIAGNOSIS — I5043 Acute on chronic combined systolic (congestive) and diastolic (congestive) heart failure: Secondary | ICD-10-CM

## 2015-11-13 DIAGNOSIS — J449 Chronic obstructive pulmonary disease, unspecified: Secondary | ICD-10-CM

## 2015-11-13 DIAGNOSIS — E1122 Type 2 diabetes mellitus with diabetic chronic kidney disease: Secondary | ICD-10-CM

## 2015-11-13 DIAGNOSIS — Z955 Presence of coronary angioplasty implant and graft: Secondary | ICD-10-CM

## 2015-11-13 DIAGNOSIS — I252 Old myocardial infarction: Secondary | ICD-10-CM

## 2015-11-13 DIAGNOSIS — E1165 Type 2 diabetes mellitus with hyperglycemia: Secondary | ICD-10-CM

## 2015-11-13 LAB — URINALYSIS, ROUTINE W REFLEX MICROSCOPIC
BILIRUBIN URINE: NEGATIVE
GLUCOSE, UA: NEGATIVE mg/dL
KETONES UR: NEGATIVE mg/dL
Nitrite: POSITIVE — AB
PH: 5 (ref 5.0–8.0)
Protein, ur: >300 mg/dL — AB
Specific Gravity, Urine: 1.016 (ref 1.005–1.030)

## 2015-11-13 LAB — BASIC METABOLIC PANEL
Anion gap: 13 (ref 5–15)
BUN: 31 mg/dL — ABNORMAL HIGH (ref 6–20)
CALCIUM: 9.1 mg/dL (ref 8.9–10.3)
CHLORIDE: 100 mmol/L — AB (ref 101–111)
CO2: 26 mmol/L (ref 22–32)
CREATININE: 3.56 mg/dL — AB (ref 0.61–1.24)
GFR, EST AFRICAN AMERICAN: 17 mL/min — AB (ref >60)
GFR, EST NON AFRICAN AMERICAN: 15 mL/min — AB (ref >60)
Glucose, Bld: 122 mg/dL — ABNORMAL HIGH (ref 65–99)
Potassium: 4 mmol/L (ref 3.5–5.1)
SODIUM: 139 mmol/L (ref 135–145)

## 2015-11-13 LAB — URINE MICROSCOPIC-ADD ON

## 2015-11-13 LAB — GLUCOSE, CAPILLARY
GLUCOSE-CAPILLARY: 109 mg/dL — AB (ref 65–99)
GLUCOSE-CAPILLARY: 218 mg/dL — AB (ref 65–99)
Glucose-Capillary: 200 mg/dL — ABNORMAL HIGH (ref 65–99)
Glucose-Capillary: 252 mg/dL — ABNORMAL HIGH (ref 65–99)

## 2015-11-13 LAB — SODIUM, URINE, RANDOM: SODIUM UR: 43 mmol/L

## 2015-11-13 LAB — MAGNESIUM: Magnesium: 2.2 mg/dL (ref 1.7–2.4)

## 2015-11-13 LAB — CREATININE, URINE, RANDOM: Creatinine, Urine: 167.23 mg/dL

## 2015-11-13 MED ORDER — MUSCLE RUB 10-15 % EX CREA
TOPICAL_CREAM | CUTANEOUS | Status: DC | PRN
  Administered 2015-11-14: via TOPICAL
  Filled 2015-11-13: qty 85

## 2015-11-13 NOTE — Progress Notes (Signed)
Internal Medicine Attending  Date: 11/13/2015  Patient name: [name] Medical record number: [id number] Date of birth: 01/18/1931 Age: 82 y.o. Gender: male  I saw and evaluated the patient. I reviewed the resident's note by Dr. Posey Pronto and I agree with the resident's findings and plans as documented in his progress note.  [name] was feeling well today when seen on rounds. He was up in a chair and in good spirits. His renal function continues to slowly decline over the last 24-36 hours. Review of the medical records reveals an episode of hypotension with a systolic blood pressure of 52, although the surrounding blood pressures are in the 140s. This raises the concern the 52 may have been a transcription error and actually been 152. If this were the case, his acute on chronic renal failure may be related to his aggressive diuresis with a resultant prerenal azotemia. If the episode of hypotension was, in fact true, and he had a systolic blood pressure in the 50s then his acute kidney injury may be related to acute tubular necrosis.   Given his age we will make sure there is no obstruction that may be reversible and assess with a renal ultrasound. Now that he has been off of diuretics for the last 24 hours we will check a urine FeNa trying to distinguish between a prerenal azotemia and an acute tubular necrosis. Given his clinical improvement and his lack of complaints on rounds today we will continue to hold both the Lasix and the lisinopril while we evaluate the new information as noted above. We will continue to follow serial basic metabolic panels and reassess his pulmonary exam tomorrow in light of continuing to hold his Lasix. We will also not rehydrate him as of yet until we can, if possible, establish a definitive explanation for his acute kidney injury, specifically a prerenal azotemia.

## 2015-11-13 NOTE — Progress Notes (Addendum)
Subjective: This morning, he was sitting comfortably in a chair. He denied any complaints and proceeded to tell us about a 21-day cruise he had with girlfriend where he sat sail to exotic locations, like [place] and the [place]. We explained to him that his kidney function took a turn for the worse while we work on getting the fluid off his system and that we would be watching him carefully.    Objective: Vital signs in last 24 hours: Filed Vitals:   11/12/15 2024 11/13/15 0020 11/13/15 0530 11/13/15 0800  BP: 129/63 108/65 146/74 136/70  Pulse: 71 68 62 66  Temp: 98.4 F (36.9 C) 98.2 F (36.8 C) 98.5 F (36.9 C) 98 F (36.7 C)  TempSrc: Oral Oral Oral Oral  Resp: 18 18 18 16   Height: 5\' 10"  (1.778 m)     Weight: 166 lb (75.297 kg)  165 lb 4.8 oz (74.98 kg)   SpO2: 100% 100% 94% 96%   Weight change:   Intake/Output Summary (Last 24 hours) at 11/13/15 1147 Last data filed at 11/13/15 0900  Gross per 24 hour  Intake    480 ml  Output   1450 ml  Net   -970 ml    Physical Exam General: alert, elderly male resting comfortably in bed in no acute distress Head: normocephalic and atraumatic.  Neck: supple, full ROM, no thyromegaly, no JVD and no carotid bruits Lungs: normal respiratory effort, no accessory muscle use, crackles L > R Heart: normal rate, regular rhythm, no murmur, no gallop, and no rub.  Abdomen: soft, non-tender, normal bowel sounds, no distention, no guarding Extremities: 1+ pitting edema to knees bilaterally Skin: turgor normal and no rashes.   Medications: I have reviewed the patient's current medications. Scheduled Meds: . aspirin EC  81 mg Oral Daily  . carvedilol  25 mg Oral BID WC  . enoxaparin (LOVENOX) injection  30 mg Subcutaneous Daily  . insulin aspart  0-15 Units Subcutaneous TID WC  . insulin glargine  25 Units Subcutaneous QHS  . pantoprazole  40 mg Oral Daily  . simvastatin  40 mg Oral Daily  . sodium chloride flush  3 mL Intravenous Q12H     Continuous Infusions:   PRN Meds:.sodium chloride, acetaminophen **OR** acetaminophen, albuterol, promethazine, senna-docusate Assessment/Plan:  AKI on [name] stage III - Creatinine 3.6 today, up from 1.4 on admission with 67% decrease in GFR [52->17]. ATN a consideration given the low systolic blood pressure in the 50s yesterday while on the nitro gtt in addition to lisinopril 20mg  and Lasix 80mg  IV twice daily which he received two days ago.   -Check FeNa, UA, & renal US today -Recheck BMET tomorrow -Holding lisinopril & Lasix for now  Acute on chronic combined systolic and diastolic congestive heart failure: ECHO done 2/16 with EF 20-25% stable from ECHO last year. Weight stable at 165 lbs today from 165-166 yesterday. No JVD present though crackles noted on lung exam. PT recommended HH PT or SNF if he does not improve.  -Supplemental oxygen as needed  -Strict I&O's, daily weights -PT eval and treat -Continue Coreg 25mg  twice daily -Lasix and lisinopril as noted above  Coronary artery disease: MI in 1990 with last cardiac cath 07/09/2003 notable for three-vessel coronary disease, moderate pulmonary hypertension.  -Continue home medication aspirin 81 mg daily -Coreg, lisinopril as noted above  Peripheral vascular disease: s/p PCI to the L ICA on 09/11/2003 for 90% concentric narrowing of the proximal segment. -ASA as noted above.  COPD: No PFTs on file. -Albuterol nebulizer 5 mg every 6 hours as needed  DM2 - CBG 122 this AM, improved from 181-233 yesterday. A1c 9.7, worse from 8.8 in July 2016. Patient takes Lantus 30 units at bedtime at home. -Continue Lantus 25 units at bedtime & Sliding-scale insulin -CBG 4 times a day  Hypertension: BP trending mostly 110s-140/60s-70s. -Coreg, lisinopril as noted above  Hyperlipidemia  -Continue home medication Simvastatin 40 mg daily  History of ischemic cardiomyopathy - Echo done 2/16 shows EF of 20-25% unchanged from January  2016. -ASA, Lipitor, lisinopril, Coreg as noted above  GERD -Continue home medication Protonix 40 mg daily  Diet: sodium restriction diet  DVT prophylaxis: Lovenox  Code: Full  Dispo: Disposition is deferred at this time, awaiting improvement of current medical problems.  Anticipated discharge in approximately 1-2 day(s).   The patient does have a current PCP [name], MD) and does need an [facility] hospital follow-up appointment after discharge.  The patient does not have transportation limitations that hinder transportation to clinic appointments.  .Services Needed at time of discharge: Y = Yes, Blank = No PT:   OT:   RN:   Equipment:   Other:     LOS: 2 days   Riccardo Dubin, MD IMTS PGY-2 [phone]  11/13/2015, 11:47 AM

## 2015-11-14 DIAGNOSIS — K649 Unspecified hemorrhoids: Secondary | ICD-10-CM

## 2015-11-14 DIAGNOSIS — R8271 Bacteriuria: Secondary | ICD-10-CM

## 2015-11-14 LAB — BASIC METABOLIC PANEL
ANION GAP: 8 (ref 5–15)
BUN: 34 mg/dL — ABNORMAL HIGH (ref 6–20)
CALCIUM: 8.7 mg/dL — AB (ref 8.9–10.3)
CO2: 27 mmol/L (ref 22–32)
Chloride: 102 mmol/L (ref 101–111)
Creatinine, Ser: 2.88 mg/dL — ABNORMAL HIGH (ref 0.61–1.24)
GFR, EST AFRICAN AMERICAN: 22 mL/min — AB (ref >60)
GFR, EST NON AFRICAN AMERICAN: 19 mL/min — AB (ref >60)
Glucose, Bld: 233 mg/dL — ABNORMAL HIGH (ref 65–99)
POTASSIUM: 4 mmol/L (ref 3.5–5.1)
SODIUM: 137 mmol/L (ref 135–145)

## 2015-11-14 LAB — URINE MICROSCOPIC-ADD ON

## 2015-11-14 LAB — URINALYSIS, ROUTINE W REFLEX MICROSCOPIC
BILIRUBIN URINE: NEGATIVE
Glucose, UA: NEGATIVE mg/dL
KETONES UR: NEGATIVE mg/dL
NITRITE: POSITIVE — AB
PROTEIN: 100 mg/dL — AB
Specific Gravity, Urine: 1.017 (ref 1.005–1.030)
pH: 5 (ref 5.0–8.0)

## 2015-11-14 LAB — GLUCOSE, CAPILLARY
GLUCOSE-CAPILLARY: 222 mg/dL — AB (ref 65–99)
GLUCOSE-CAPILLARY: 178 mg/dL — AB (ref 65–99)
GLUCOSE-CAPILLARY: 180 mg/dL — AB (ref 65–99)

## 2015-11-14 MED ORDER — HYDROCORTISONE 2.5 % RE CREA
TOPICAL_CREAM | Freq: Three times a day (TID) | RECTAL | Status: DC | PRN
  Administered 2015-11-14: 12:00:00 via RECTAL
  Filled 2015-11-14: qty 28.35

## 2015-11-14 MED ORDER — PRAMOXINE-ZINC OXIDE IN MO 1-12.5 % RE OINT
TOPICAL_OINTMENT | Freq: Three times a day (TID) | RECTAL | Status: DC | PRN
  Filled 2015-11-14: qty 28.3

## 2015-11-14 NOTE — Progress Notes (Signed)
Physical Therapy Treatment Patient Details Name: [name] MRN: [id number] DOB: 05/06/1931 Today's Date: 11/14/2015    History of Present Illness patient is an 82 yo male with a complex medical history who presents with Acute hypoxic respiratory failure due to systolic CHF exacerbation.    PT Comments    Making good progress. Still a bit off balance with gait training but greatly improves with use of an assistive device (RW) for support. Anticipate with this rate of progression he will be adequate for d/c to home with HHPT follow-up. Tolerated therapy session well today. States he feels much stronger. Will continue to follow and progress.  Follow Up Recommendations  Home health PT;Supervision for mobility/OOB     Equipment Recommendations  Rolling walker with 5" wheels    Recommendations for Other Services       Precautions / Restrictions Precautions Precautions: Fall Restrictions Weight Bearing Restrictions: No    Mobility  Bed Mobility Overal bed mobility: Needs Assistance Bed Mobility: Supine to Sit     Supine to sit: Min assist;HOB elevated     General bed mobility comments: Min assist for patient to pull through PTs hand. Use of rail as needed. Cues for technique. Practiced x2  Transfers Overall transfer level: Needs assistance Equipment used: Rolling walker (2 wheeled);None Transfers: Sit to/from Stand Sit to Stand: Min guard         General transfer comment: min guard for safety. x2 from bed and from recliner without assistive device. balance improved by use of RW for support upon standing. Cues for hand placement.  Ambulation/Gait Ambulation/Gait assistance: Min guard Ambulation Distance (Feet): 350 Feet Assistive device: Rolling walker (2 wheeled);None Gait Pattern/deviations: Step-through pattern;Decreased stride length;Scissoring;Staggering right;Narrow base of support Gait velocity: decreased Gait velocity interpretation: Below normal speed  for age/gender General Gait Details: Initially moving quite well with RW for support and VC for placement for proximity with turns demonstrating some scissoring of gait but without loss of balance. Second half of distance pt ambulating without RW. Demonstrating increased instability but did not require physical assistance to correct although very close guard for safety due to occasional staggering. VC for awareness and safety. Practiced higher level tasks for dynamic balance including high marching, quick turns, slow turns, backwards stepping, variable speeds, and horizontal/vertical head turns. Each task was challenging but pt was able to complete without need for physical assist today.   Stairs            Wheelchair Mobility    Modified Rankin (Stroke Patients Only)       Balance                                    Cognition Arousal/Alertness: Awake/alert Behavior During Therapy: WFL for tasks assessed/performed Overall Cognitive Status: Within Functional Limits for tasks assessed                      Exercises      General Comments General comments (skin integrity, edema, etc.): States he is very active with his "lady friend" and in the community. States he is rarely at home as they love to take the bus on trips frequently.  Agrees he would benefit from use of a rolling walker and follow up HHPT but overall states he feels much stronger today.      Pertinent Vitals/Pain Pain Assessment: Faces Faces Pain Scale: Hurts little more Pain Location:  buttocks - per pt due to hemorrhoids Pain Descriptors / Indicators: Burning Pain Intervention(s): Monitored during session;Repositioned    Home Living Family/patient expects to be discharged to:: Private residence (independent living facility) Living Arrangements: Alone Available Help at Discharge:  (has meals at the cafe ) Type of Home: Apartment Home Access: Level entry     Home Equipment:  None Additional Comments: question reliability of information provided, patient states that he was completely indpendent prior to arrival, and needs to be to return home    Prior Function Level of Independence: Independent      Comments: states no device, but colleague that is familiar with patient states that he uses a walker at times   PT Goals (current goals can now be found in the care plan section) Acute Rehab PT Goals Patient Stated Goal: to go home PT Goal Formulation: With patient Time For Goal Achievement: 11/26/15 Potential to Achieve Goals: Fair Progress towards PT goals: Progressing toward goals    Frequency  Min 3X/week    PT Plan Current plan remains appropriate    Co-evaluation             End of Session Equipment Utilized During Treatment: Gait belt Activity Tolerance: Patient tolerated treatment well Patient left: with call bell/phone within reach (On BSC for BM - NT aware, coming to room)     Time: [id number] PT Time Calculation (min) (ACUTE ONLY): 20 min  Charges:  $Gait Training: 8-22 mins                    G Codes:      Ellouise Newer 2015/11/23, 4:42 PM  Camille Bal Martin, [place]

## 2015-11-14 NOTE — Progress Notes (Signed)
Subjective: [name] was sitting comfortably in a chair this morning. His only complaint is burning pain with defecation. He has had 4 bowel movements this morning (soft, formed, non-watery) with no blood. Reports no straining with defecation. Has a history of hemorrhoids that has responded to Sitz baths in the past. Denies any shortness of breath or cough. No dysuria symptoms.   Objective: Vital signs in last 24 hours: Filed Vitals:   11/14/15 0022 11/14/15 0300 11/14/15 0348 11/14/15 0351  BP: 111/42   132/55  Pulse: 65   78  Temp: 100 F (37.8 C) 99.1 F (37.3 C)  98.3 F (36.8 C)  TempSrc: Oral Oral  Oral  Resp: 14   18  Height:      Weight:   164 lb 12.8 oz (74.753 kg)   SpO2: 97%   97%   Weight change: -15.8 oz (-0.447 kg)  Intake/Output Summary (Last 24 hours) at 11/14/15 [id number] Last data filed at 11/14/15 0600  Gross per 24 hour  Intake    820 ml  Output   1927 ml  Net  -1107 ml    Physical Exam General: alert, elderly male resting comfortably in bed in no acute distress Head: normocephalic and atraumatic.  Neck: supple, full ROM, no thyromegaly, no JVD and no carotid bruits Lungs: normal respiratory effort, no accessory muscle use, clear to auscultation, no crackles present Heart: normal rate, regular rhythm, no murmur, no gallop, and no rub.  Abdomen: soft, non-tender, normal bowel sounds, no distention, no guarding Extremities: 1+ pitting edema to knees bilaterally Skin: turgor normal and no rashes.   Medications: I have reviewed the patient's current medications. Scheduled Meds: . aspirin EC  81 mg Oral Daily  . carvedilol  25 mg Oral BID WC  . enoxaparin (LOVENOX) injection  30 mg Subcutaneous Daily  . insulin aspart  0-15 Units Subcutaneous TID WC  . insulin glargine  25 Units Subcutaneous QHS  . pantoprazole  40 mg Oral Daily  . simvastatin  40 mg Oral Daily  . sodium chloride flush  3 mL Intravenous Q12H   Continuous Infusions:   PRN Meds:.sodium  chloride, acetaminophen **OR** acetaminophen, albuterol, MUSCLE RUB, promethazine, senna-docusate Assessment/Plan:  AKI on [name] stage III - Creatinine improved to 2.88 today, down from peak of 3.56 yesterday but still elevated from 1.4 on admission. GFR slightly improved from 17 -> 22. Some concern for possible ATN following documented systolic BP in the [id number] but was likely a transcription error and was actually 152. Renal US with no evidence of hydronephrosis with underlying medical renal disease and simple right-sided renal cysts that have slowly enlarged since 2006. Fractional excretion of Na was 0.66% suggestive of pre-renal azotemia from aggressive diuresis rather than ATN. Weight stable at 164 lbs with net 1.1 L out yesterday.  -BMET in am -Holding lisinopril & Lasix for now, consider restarting home dose lasix when renal function back near baseline  Acute on chronic combined systolic and diastolic congestive heart failure: ECHO done 2/16 with EF 20-25% stable from ECHO last year. Weight stable at 164 lbs today from 165 yesterday. No JVD present and no crackles noted on lung exam. PT recommended HH PT or SNF if he does not improve.  -Supplemental oxygen as needed (currently on RA) -Strict I&O's, daily weights -PT eval and treat -Continue Coreg 25mg  twice daily -Lasix and lisinopril as noted above  Asymptomatic Bacteruria: Patient with blood tinged urine noted by nursing staff. UA done yesterday gross blood  and 6-30 squams after foley placed. Likely traumatic and contaminated from foley insertion. Repeat UA done this morning shows large Hgb, positive nitrite, large leukocytes, few bacteria, 0-5 squams and hyaline casts. Patient denies any urinary symptoms at this time. No fevers or chills since admission. Urine culture pending. Will not start antibiotic therapy at this time.  -Follow up Urine culture -D/C foley  Hemorrhoids: Patient with a history of hemorrhoids complaining of burning with  defecation. No blood in stools. Stools have been soft be well formed. Reports Stiz baths have improved symptoms in the past.  -Sitz baths prn -Tucks prn  Coronary artery disease: MI in 1990 with last cardiac cath 07/09/2003 notable for three-vessel coronary disease, moderate pulmonary hypertension.  -Continue home medication aspirin 81 mg daily -Coreg, lisinopril as noted above  Peripheral vascular disease: s/p PCI to the L ICA on 09/11/2003 for 90% concentric narrowing of the proximal segment. -ASA as noted above.  COPD: No PFTs on file. -Albuterol nebulizer 5 mg every 6 hours as needed  DM2 - CBG 222 this AM, fasting glucose yesterday morning was 109 with a range of 109-252. A1c 9.7, worse from 8.8 in July 2016. Patient takes Lantus 30 units at bedtime at home. -Continue Lantus 25 units at bedtime & Sliding-scale insulin -CBG 4 times a day  Hypertension: BP trending mostly 110s-130/40s-70s. -Coreg, lisinopril (holding) as noted above  Hyperlipidemia  -Continue home medication Simvastatin 40 mg daily  History of ischemic cardiomyopathy - Echo done 2/16 shows EF of 20-25% unchanged from January 2016. -ASA, Lipitor, lisinopril (holding), Coreg as noted above  GERD -Continue home medication Protonix 40 mg daily  Diet: sodium restriction diet  DVT prophylaxis: Lovenox  Code: Full  Dispo: Disposition is deferred at this time, awaiting improvement of current medical problems.  Anticipated discharge in approximately 1-2 day(s).   The patient does have a current PCP [name], MD) and does need an [facility] hospital follow-up appointment after discharge.  The patient does not have transportation limitations that hinder transportation to clinic appointments.  .Services Needed at time of discharge: Y = Yes, Blank = No PT:   OT:   RN:   Equipment:   Other:     LOS: 3 days   Maryellen Pile, MD IMTS PGY-2 [phone]  11/14/2015, 7:11 AM

## 2015-11-14 NOTE — Progress Notes (Signed)
SITZ BATH given and Anbesol cream applied with relief of pain , Foley cath d/c patient tolerated procedure well.

## 2015-11-14 NOTE — Progress Notes (Signed)
Bladder scan complete 350cc. Pt unable to void. Walked with therapy used the the [facility] pt stated he voided however fluid left in bedside commode un able to determine if pt voided. Educated pt on the procedure for retention.

## 2015-11-14 NOTE — Progress Notes (Signed)
Internal Medicine Attending  Date: 11/14/2015  Patient name: [name] Medical record number: [id number] Date of birth: 1931/06/27 Age: 82 y.o. Gender: male  I saw and evaluated the patient. I reviewed the resident's note by Dr. Charlynn Grimes and I agree with the resident's findings and plans as documented in his progress note.  [name] was feeling well this morning when seen on rounds with the exception of some irritated hemorrhoids. He denied any shortness of breath. Physical exam was notable for clear lungs bilaterally without rales. The FeNa yesterday was less than 1 suggesting a prerenal azotemia. His creatinine today has improved from 3.56 to 2.88 with continued hold of the Lasix and ACE inhibitor. Even if there is a component of ATN related to a hypotensive episode, the creatinine seems to have peaked and his renal function is returning towards his baseline. Given his lack of shortness of breath and clear lungs on exam we will continue to hold the Lasix and ACE inhibitor for an additional day and reassess his exam as well as his renal function in the morning.

## 2015-11-15 ENCOUNTER — Telehealth: Payer: Self-pay | Admitting: Internal Medicine

## 2015-11-15 DIAGNOSIS — R338 Other retention of urine: Secondary | ICD-10-CM

## 2015-11-15 LAB — GLUCOSE, CAPILLARY
GLUCOSE-CAPILLARY: 116 mg/dL — AB (ref 65–99)
Glucose-Capillary: 138 mg/dL — ABNORMAL HIGH (ref 65–99)

## 2015-11-15 LAB — BASIC METABOLIC PANEL WITH GFR
Anion gap: 8 (ref 5–15)
BUN: 37 mg/dL — ABNORMAL HIGH (ref 6–20)
CO2: 26 mmol/L (ref 22–32)
Calcium: 8.7 mg/dL — ABNORMAL LOW (ref 8.9–10.3)
Chloride: 102 mmol/L (ref 101–111)
Creatinine, Ser: 2.59 mg/dL — ABNORMAL HIGH (ref 0.61–1.24)
GFR calc Af Amer: 25 mL/min — ABNORMAL LOW (ref >60)
GFR calc non Af Amer: 21 mL/min — ABNORMAL LOW (ref >60)
Glucose, Bld: 126 mg/dL — ABNORMAL HIGH (ref 65–99)
Potassium: 3.6 mmol/L (ref 3.5–5.1)
Sodium: 136 mmol/L (ref 135–145)

## 2015-11-15 MED ORDER — FUROSEMIDE 40 MG PO TABS
40.0000 mg | ORAL_TABLET | Freq: Two times a day (BID) | ORAL | Status: DC
  Administered 2015-11-15: 40 mg via ORAL
  Filled 2015-11-15: qty 1

## 2015-11-15 MED ORDER — LIDOCAINE HCL 2 % EX GEL
1.0000 "application " | Freq: Once | CUTANEOUS | Status: DC
  Filled 2015-11-15: qty 5

## 2015-11-15 MED ORDER — CEPHALEXIN 500 MG PO CAPS: 500.0000 mg | ORAL_CAPSULE | Freq: Two times a day (BID) | ORAL | Status: DC

## 2015-11-15 MED ORDER — LISINOPRIL 5 MG PO TABS
5.0000 mg | ORAL_TABLET | Freq: Every day | ORAL | Status: DC
  Administered 2015-11-15: 5 mg via ORAL
  Filled 2015-11-15: qty 1

## 2015-11-15 NOTE — Telephone Encounter (Signed)
Pt needs TOC pt scheduled for 11/22/15 at 10:45 with dr [name] Plummer

## 2015-11-15 NOTE — Progress Notes (Signed)
Patient reports he was able to urinate a small amount. Patient was bladder scanned showing 299cc. Spoke with MD, will have patient sit on [facility] next time he feels he needs to void. Will continue to monitor.

## 2015-11-15 NOTE — Discharge Instructions (Addendum)
[name], you were admitted because of a CHF exacerbation. It is important that you weight yourself at home on a daily basis. This will give [place] a better idea of how much fluid you are retaining and we can adjust your Lasix dose accordingly to get that fluid off.   I am giving you a prescription for an antibiotic. We found some bacteria in your urine and with the foley in place we would like to treat it. It will be a prescription for Keflex for a 10 day course.   I have scheduled you a follow up appointment in our clinic downstairs. It is next Monday the 27th at 10:45 AM with Dr. [name] Plummer.   Heart Failure Heart failure is a condition in which the heart has trouble pumping blood. This means your heart does not pump blood efficiently for your body to work well. In some cases of heart failure, fluid may back up into your lungs or you may have swelling (edema) in your lower legs. Heart failure is usually a long-term (chronic) condition. It is important for you to take good care of yourself and follow your health care provider's treatment plan. CAUSES  Some health conditions can cause heart failure. Those health conditions include:  High blood pressure (hypertension). Hypertension causes the heart muscle to work harder than normal. When pressure in the blood vessels is high, the heart needs to pump (contract) with more force in order to circulate blood throughout the body. High blood pressure eventually causes the heart to become stiff and weak.  Coronary artery disease (CAD). CAD is the buildup of cholesterol and fat (plaque) in the arteries of the heart. The blockage in the arteries deprives the heart muscle of oxygen and blood. This can cause chest pain and may lead to a heart attack. High blood pressure can also contribute to CAD.  Heart attack (myocardial infarction). A heart attack occurs when one or more arteries in the heart become blocked. The loss of oxygen damages the muscle tissue of the heart.  When this happens, part of the heart muscle dies. The injured tissue does not contract as well and weakens the heart's ability to pump blood.  Abnormal heart valves. When the heart valves do not open and close properly, it can cause heart failure. This makes the heart muscle pump harder to keep the blood flowing.  Heart muscle disease (cardiomyopathy or myocarditis). Heart muscle disease is damage to the heart muscle from a variety of causes. These can include drug or alcohol abuse, infections, or unknown reasons. These can increase the risk of heart failure.  Lung disease. Lung disease makes the heart work harder because the lungs do not work properly. This can cause a strain on the heart, leading it to fail.  Diabetes. Diabetes increases the risk of heart failure. High blood sugar contributes to high fat (lipid) levels in the blood. Diabetes can also cause slow damage to tiny blood vessels that carry important nutrients to the heart muscle. When the heart does not get enough oxygen and food, it can cause the heart to become weak and stiff. This leads to a heart that does not contract efficiently.  Other conditions can contribute to heart failure. These include abnormal heart rhythms, thyroid problems, and low blood counts (anemia). Certain unhealthy behaviors can increase the risk of heart failure, including:  Being overweight.  Smoking or chewing tobacco.  Eating foods high in fat and cholesterol.  Abusing illicit drugs or alcohol.  Lacking physical activity.  SYMPTOMS  Heart failure symptoms may vary and can be hard to detect. Symptoms may include:  Shortness of breath with activity, such as climbing stairs.  Persistent cough.  Swelling of the feet, ankles, legs, or abdomen.  Unexplained weight gain.  Difficulty breathing when lying flat (orthopnea).  Waking from sleep because of the need to sit up and get more air.  Rapid heartbeat.  Fatigue and loss of energy.  Feeling  light-headed, dizzy, or close to fainting.  Loss of appetite.  Nausea.  Increased urination during the night (nocturia). DIAGNOSIS  A diagnosis of heart failure is based on your history, symptoms, physical examination, and diagnostic tests. Diagnostic tests for heart failure may include:  Echocardiography.  Electrocardiography.  Chest X-ray.  Blood tests.  Exercise stress test.  Cardiac angiography.  Radionuclide scans. TREATMENT  Treatment is aimed at managing the symptoms of heart failure. Medicines, behavioral changes, or surgical intervention may be necessary to treat heart failure.  Medicines to help treat heart failure may include:  Angiotensin-converting enzyme (ACE) inhibitors. This type of medicine blocks the effects of a blood protein called angiotensin-converting enzyme. ACE inhibitors relax (dilate) the blood vessels and help lower blood pressure.  Angiotensin receptor blockers (ARBs). This type of medicine blocks the actions of a blood protein called angiotensin. Angiotensin receptor blockers dilate the blood vessels and help lower blood pressure.  Water pills (diuretics). Diuretics cause the kidneys to remove salt and water from the blood. The extra fluid is removed through urination. This loss of extra fluid lowers the volume of blood the heart pumps.  Beta blockers. These prevent the heart from beating too fast and improve heart muscle strength.  Digitalis. This increases the force of the heartbeat.  Healthy behavior changes include:  Obtaining and maintaining a healthy weight.  Stopping smoking or chewing tobacco.  Eating heart-healthy foods.  Limiting or avoiding alcohol.  Stopping illicit drug use.  Physical activity as directed by your health care provider.  Surgical treatment for heart failure may include:  A procedure to open blocked arteries, repair damaged heart valves, or remove damaged heart muscle tissue.  A pacemaker to improve heart  muscle function and control certain abnormal heart rhythms.  An internal cardioverter defibrillator to treat certain serious abnormal heart rhythms.  A left ventricular assist device (LVAD) to assist the pumping ability of the heart. HOME CARE INSTRUCTIONS   Take medicines only as directed by your health care provider. Medicines are important in reducing the workload of your heart, slowing the progression of heart failure, and improving your symptoms.  Do not stop taking your medicine unless directed by your health care provider.  Do not skip any dose of medicine.  Refill your prescriptions before you run out of medicine. Your medicines are needed every day.  Engage in moderate physical activity if directed by your health care provider. Moderate physical activity can benefit some people. The elderly and people with severe heart failure should consult with a health care provider for physical activity recommendations.  Eat heart-healthy foods. Food choices should be free of trans fat and low in saturated fat, cholesterol, and salt (sodium). Healthy choices include fresh or frozen fruits and vegetables, fish, lean meats, legumes, fat-free or low-fat dairy products, and whole grain or high fiber foods. Talk to a dietitian to learn more about heart-healthy foods.  Limit sodium if directed by your health care provider. Sodium restriction may reduce symptoms of heart failure in some people. Talk to a dietitian to learn  more about heart-healthy seasonings.  Use healthy cooking methods. Healthy cooking methods include roasting, grilling, broiling, baking, poaching, steaming, or stir-frying. Talk to a dietitian to learn more about healthy cooking methods.  Limit fluids if directed by your health care provider. Fluid restriction may reduce symptoms of heart failure in some people.  Weigh yourself every day. Daily weights are important in the early recognition of excess fluid. You should weigh yourself  every morning after you urinate and before you eat breakfast. Wear the same amount of clothing each time you weigh yourself. Record your daily weight. Provide your health care provider with your weight record.  Monitor and record your blood pressure if directed by your health care provider.  Check your pulse if directed by your health care provider.  Lose weight if directed by your health care provider. Weight loss may reduce symptoms of heart failure in some people.  Stop smoking or chewing tobacco. Nicotine makes your heart work harder by causing your blood vessels to constrict. Do not use nicotine gum or patches before talking to your health care provider.  Keep all follow-up visits as directed by your health care provider. This is important.  Limit alcohol intake to no more than 1 drink per day for nonpregnant women and 2 drinks per day for men. One drink equals 12 ounces of beer, 5 ounces of wine, or 1 ounces of hard liquor. Drinking more than that is harmful to your heart. Tell your health care provider if you drink alcohol several times a week. Talk with your health care provider about whether alcohol is safe for you. If your heart has already been damaged by alcohol or you have severe heart failure, drinking alcohol should be stopped completely.  Stop illicit drug use.  Stay up-to-date with immunizations. It is especially important to prevent respiratory infections through current pneumococcal and influenza immunizations.  Manage other health conditions such as hypertension, diabetes, thyroid disease, or abnormal heart rhythms as directed by your health care provider.  Learn to manage stress.  Plan rest periods when fatigued.  Learn strategies to manage high temperatures. If the weather is extremely hot:  Avoid vigorous physical activity.  Use air conditioning or fans or seek a cooler location.  Avoid caffeine and alcohol.  Wear loose-fitting, lightweight, and light-colored  clothing.  Learn strategies to manage cold temperatures. If the weather is extremely cold:  Avoid vigorous physical activity.  Layer clothes.  Wear mittens or gloves, a hat, and a scarf when going outside.  Avoid alcohol.  Obtain ongoing education and support as needed.  Participate in or seek rehabilitation as needed to maintain or improve independence and quality of life. SEEK MEDICAL CARE IF:   You have a rapid weight gain.  You have increasing shortness of breath that is unusual for you.  You are unable to participate in your usual physical activities.  You tire easily.  You cough more than normal, especially with physical activity.  You have any or more swelling in areas such as your hands, feet, ankles, or abdomen.  You are unable to sleep because it is hard to breathe.  You feel like your heart is beating fast (palpitations).  You become dizzy or light-headed upon standing up. SEEK IMMEDIATE MEDICAL CARE IF:   You have difficulty breathing.  There is a change in mental status such as decreased alertness or difficulty with concentration.  You have a pain or discomfort in your chest.  You have an episode of fainting (syncope).  MAKE SURE YOU:   Understand these instructions.  Will watch your condition.  Will get help right away if you are not doing well or get worse.   This information is not intended to replace advice given to you by your health care provider. Make sure you discuss any questions you have with your health care provider.   Document Released: 09/11/2005 Document Revised: 01/26/2015 Document Reviewed: 10/11/2012 Elsevier Interactive Patient Education [employer].

## 2015-11-15 NOTE — Progress Notes (Signed)
Physical Therapy Treatment Patient Details Name: [name] MRN: [id number] DOB: 01/09/1931 Today's Date: 17-Nov-2015    History of Present Illness patient is an 82 yo male with a complex medical history who presents with Acute hypoxic respiratory failure due to systolic CHF exacerbation.    PT Comments    Patient seen for mobility progression and education with use of new assistive device. Patient had walked with nursing earlier (532ft). Ambulated again with this therapist using rollator device. Educated patient on use and safety with features including locking mechanisms and technique for safely sitting on device. Patient receptive and demonstrates proper use with teach back. Spoke with patient regarding mobility expectations and safety upon discharge.    Follow Up Recommendations  Home health PT;Supervision for mobility/OOB     Equipment Recommendations  Rolling walker with 5" wheels    Recommendations for Other Services       Precautions / Restrictions Precautions Precautions: Fall Restrictions Weight Bearing Restrictions: No    Mobility  Bed Mobility               General bed mobility comments: received in chair  Transfers Overall transfer level: Needs assistance Equipment used: 4-wheeled walker Transfers: Sit to/from Stand Sit to Stand: Supervision         General transfer comment: educated on use of locks and breaks for rollator  Ambulation/Gait Ambulation/Gait assistance: Supervision Ambulation Distance (Feet): 180 Feet Assistive device: 4-wheeled walker Gait Pattern/deviations: Step-through pattern;Decreased stride length;Narrow base of support;Drifts right/left Gait velocity: improved with use of rollator       Stairs            Wheelchair Mobility    Modified Rankin (Stroke Patients Only)       Balance                                    Cognition Arousal/Alertness: Awake/alert Behavior During Therapy: WFL  for tasks assessed/performed Overall Cognitive Status: Within Functional Limits for tasks assessed                      Exercises      General Comments        Pertinent Vitals/Pain Pain Assessment: No/denies pain    Home Living                      Prior Function            PT Goals (current goals can now be found in the care plan section) Acute Rehab PT Goals Patient Stated Goal: to go home PT Goal Formulation: With patient Time For Goal Achievement: 11/26/15 Potential to Achieve Goals: Fair Progress towards PT goals: Progressing toward goals    Frequency  Min 3X/week    PT Plan Current plan remains appropriate    Co-evaluation             End of Session Equipment Utilized During Treatment: Gait belt Activity Tolerance: Patient tolerated treatment well Patient left: in chair;with call bell/phone within reach;with chair alarm set     Time: 1202-1214 PT Time Calculation (min) (ACUTE ONLY): 12 min  Charges:  $Gait Training: 8-22 mins                    G CodesDuncan Dull 2015/11/17, 1:07 PM Alben Deeds, [place] DPT  [phone]

## 2015-11-15 NOTE — Progress Notes (Signed)
Ambulated patient in hall 500 feet on room air ( 98%) with front wheel walker.

## 2015-11-15 NOTE — Care Management Important Message (Signed)
Important Message  Patient Details  Name: [name] MRN: [id number] Date of Birth: 1931/02/10   Medicare Important Message Given:  Yes    Loann Quill 11/15/2015, 11:14 AM

## 2015-11-15 NOTE — Progress Notes (Signed)
Patient bladder scanned, 340cc.

## 2015-11-15 NOTE — Telephone Encounter (Signed)
Pt is still in hosp.

## 2015-11-15 NOTE — Progress Notes (Signed)
Subjective: [name] reports he is having difficulty voiding today which started last night. He has no pain in his abdomen or with urination. He got an in and out cath last night after a PVR of 400 mL. Denies any SOB, orthopnea or PND. Reports ambulating in the hall without difficult or dyspnea.   Objective: Vital signs in last 24 hours: Filed Vitals:   11/14/15 2126 11/15/15 0537 11/15/15 0620 11/15/15 0929  BP: 109/48 130/50  121/58  Pulse: 62 67    Temp: 98.2 F (36.8 C) 100.4 F (38 C) 99.2 F (37.3 C)   TempSrc: Oral Oral Oral   Resp: 18 16    Height:      Weight:  165 lb 3.2 oz (74.934 kg)    SpO2: 97% 100%     Weight change: 6.4 oz (0.181 kg)  Intake/Output Summary (Last 24 hours) at 11/15/15 0943 Last data filed at 11/15/15 [id number]  Gross per 24 hour  Intake    680 ml  Output    326 ml  Net    354 ml    Physical Exam General: alert, elderly male resting comfortably in bed in no acute distress Head: normocephalic and atraumatic.  Neck: supple, full ROM, no thyromegaly, no JVD and no carotid bruits Lungs: normal respiratory effort, no accessory muscle use, clear to auscultation, no crackles present Heart: normal rate, regular rhythm, no murmur, no gallop, and no rub.  Abdomen: soft, non-tender, normal bowel sounds, no distention, no guarding Extremities: 1+ pitting edema to knees bilaterally Skin: turgor normal and no rashes.   Medications: I have reviewed the patient's current medications. Scheduled Meds: . aspirin EC  81 mg Oral Daily  . carvedilol  25 mg Oral BID WC  . enoxaparin (LOVENOX) injection  30 mg Subcutaneous Daily  . furosemide  40 mg Oral BID  . insulin aspart  0-15 Units Subcutaneous TID WC  . insulin glargine  25 Units Subcutaneous QHS  . lidocaine  1 application Urethral Once  . lisinopril  5 mg Oral Daily  . pantoprazole  40 mg Oral Daily  . simvastatin  40 mg Oral Daily  . sodium chloride flush  3 mL Intravenous Q12H   Continuous  Infusions:   PRN Meds:.sodium chloride, acetaminophen **OR** acetaminophen, albuterol, hydrocortisone, MUSCLE RUB, promethazine, senna-docusate Assessment/Plan:  AKI on [name] stage III - Creatinine continues to slowly improve, 2.59 from 2.88 yesterday, down from peak of 3.56 2 days ago. Weight stable at 165 lbs with net +235 mL yesterday. Suspect that the accident extra 10 mg of lisinopril may have been a large contributor to his abrupt decline in renal function.  -Restart home dose lisinopril & Lasix today -Close follow up in clinic after discharge  Acute on chronic combined systolic and diastolic congestive heart failure: ECHO done 2/16 with EF 20-25% stable from ECHO last year. Weight stable at 165 lbs today. No JVD present and no crackles noted on lung exam. PT recommended HH PT. Satting well on RA.  -Strict I&O's, daily weights -Continue Coreg 25mg  twice daily -Lasix and lisinopril as noted above  Acute Urinary Retention: Patient with complaints of acute urinary retention. PVR last night with ~400 mL. Got an in and out cath at that time. Still having difficulty urinating this morning. Will have patient use bedside commode and recheck PVR today. If remains >250 mL will need foley placed with outpatient follow up with urology. Likely 2/2 stretch injury.  Asymptomatic Bacteruria: Repeat UA done this morning  shows large Hgb, positive nitrite, large leukocytes, few bacteria, 0-5 squams and hyaline casts. Patient denies any urinary symptoms at this time. No fevers or chills since admission. Urine culture pending. Will not start antibiotic therapy at this time.  -Follow up Urine culture  Hemorrhoids: Reports Stiz baths have improved symptoms. -Sitz baths prn -Tucks prn  Coronary artery disease: MI in 1990 with last cardiac cath 07/09/2003 notable for three-vessel coronary disease, moderate pulmonary hypertension.  -Continue home medication aspirin 81 mg daily -Coreg, lisinopril as noted  above  Peripheral vascular disease: s/p PCI to the L ICA on 09/11/2003 for 90% concentric narrowing of the proximal segment. -ASA as noted above.  COPD: No PFTs on file. -Albuterol nebulizer 5 mg every 6 hours as needed  DM2 - CBG 116 this AM. A1c 9.7, worse from 8.8 in July 2016. Patient takes Lantus 30 units at bedtime at home. -Continue Lantus 25 units at bedtime & Sliding-scale insulin -CBG 4 times a day  Hypertension: BP trending mostly 110s-130/40s-70s. -Coreg, lisinopril as noted above  Hyperlipidemia  -Continue home medication Simvastatin 40 mg daily  History of ischemic cardiomyopathy - Echo done 2/16 shows EF of 20-25% unchanged from January 2016. -ASA, Lipitor, lisinopril, Coreg as noted above  GERD -Continue home medication Protonix 40 mg daily  Diet: sodium restriction diet  DVT prophylaxis: Lovenox  Code: Full  Dispo: Anticipate discharge today  The patient does have a current PCP Robert Dunnings Lorenda Ishihara, MD) and does need an [facility] hospital follow-up appointment after discharge.  The patient does not have transportation limitations that hinder transportation to clinic appointments.  .Services Needed at time of discharge: Y = Yes, Blank = No PT:   OT:   RN:   Equipment:   Other:     LOS: 4 days   Maryellen Pile, MD [facility] PGY-2 [phone]  11/15/2015, 9:43 AM

## 2015-11-15 NOTE — Discharge Summary (Signed)
Name: [name] MRN: [id number] DOB: 30-Oct-1930 82 y.o. PCP: Axel Filler, MD  Date of Admission: 11/11/2015 12:03 AM Date of Discharge: 11/15/2015 Attending Physician: Axel Filler, MD  Discharge Diagnosis: Principal Problem:   Acute on chronic combined systolic (congestive) and diastolic (congestive) heart failure (HCC) Active Problems:   Diabetes mellitus ([place])   Hyperlipidemia   Coronary atherosclerosis   Ischemic Cardiomyopathy   Automatic implantable cardioverter-defibrillator in situ   HTN (hypertension)   [name] (chronic kidney disease) stage 3, GFR 30-59 ml/min   Aortic stenosis   Prolonged Q-T interval on ECG   Acute renal failure ([place])   Acute urinary retention  Discharge Medications:   Medication List    TAKE these medications        albuterol 108 (90 Base) MCG/ACT inhaler  Commonly known as:  VENTOLIN HFA  Inhale 1-2 puffs into the lungs every 4 (four) hours as needed for wheezing or shortness of breath.     aspirin EC 81 MG tablet  Take 81 mg by mouth daily.     B-D ULTRAFINE III SHORT PEN 31G X 8 MM Misc  Generic drug:  Insulin Pen Needle  USE AS DIRECTED ONCE DAILY FOR INSULIN INJECTION     carvedilol 25 MG tablet  Commonly known as:  COREG  TAKE 1 TABLET BY MOUTH TWICE DAILY WITH A MEAL     furosemide 40 MG tablet  Commonly known as:  LASIX  Take 1 tablet (40 mg total) by mouth 2 (two) times daily.     Insulin Glargine 100 UNIT/ML Solostar Pen  Commonly known as:  LANTUS SOLOSTAR  Inject 30 Units into the skin at bedtime.     lisinopril 5 MG tablet  Commonly known as:  PRINIVIL,ZESTRIL  Take 1 tablet (5 mg total) by mouth daily.     pantoprazole 40 MG tablet  Commonly known as:  PROTONIX  TAKE 1 TABLET BY MOUTH DAILY     simvastatin 40 MG tablet  Commonly known as:  ZOCOR  TAKE 1 TABLET BY MOUTH DAILY        Disposition and follow-up:   Mr.Knolan E Covey was discharged from [facility] in  Stable condition.  At the hospital follow up visit please address:  1.  CHF exacerbation: Discharge weight 165 lbs. Lasix 40 mg PO bid. Please assess for any ongoing SOB or edema.  2. AKI: Cr elevated at 2.5 at time of discharge. Please get a repeat BMP and assess renal function. 3. Urinary retention: Patient unable to void at discharge. Foley placed. Please make sure he has follow up with urology (referral placed). 4. UTI: culture growing coag negative staph. Discharged with 10 days of Keflex. 5.  Labs / imaging needed at time of follow-up: BMP (Cr) 6.  Pending labs/ test needing follow-up: None  Follow-up Appointments: Follow-up Information    Follow up with Juluis Mire, MD On 11/22/2015.   Specialty:  Internal Medicine   Why:  @ 10:45 AM   Contact information:   [place] [address] [phone]       Follow up with [facility].   Why:  They will do your home health care at your home   Contact information:   [address] [place] [address] [phone]       Discharge Instructions: Discharge Instructions    Ambulatory referral to Urology    Complete by:  As directed   Acute  urinary retention, foley placed will need voiding trial in 3 weeks     Diet - low sodium heart healthy    Complete by:  As directed      Increase activity slowly    Complete by:  As directed            Consultations:    Procedures Performed:  Dg Chest 2 View  11/12/2015  CLINICAL DATA:  CHF, cardiomyopathy, aortic stenosis former smoker. EXAM: CHEST  2 VIEW COMPARISON:  Portable chest x-ray of November 11, 2015 FINDINGS: The lungs are less well expanded today. On the right no infiltrate is observed. Mild interstitial prominence in the mid and lower right lung persists. On the left the hemidiaphragm remains obscured. The retrocardiac region remains dense. The cardiac silhouette is mildly enlarged. The pulmonary vascularity is mildly engorged. The  permanent pacemaker defibrillator is in stable position. IMPRESSION: Mild hypoinflation today as compared to previous studies. Stable left lower lobe atelectasis and small left pleural effusion, and stable subtle increased interstitial density on the right. Stable cardiomegaly without pulmonary vascular congestion. Electronically Signed   By: David  Martinique M.D.   On: 11/12/2015 07:12   US Renal  11/13/2015  CLINICAL DATA:  82 y.o. male with acute tubular necrosis EXAM: RENAL / URINARY TRACT ULTRASOUND COMPLETE COMPARISON:  Prior renal ultrasound 04/12/2005 FINDINGS: Right Kidney: Length: 10 cm. No hydronephrosis. Echogenic renal parenchyma with increased conspicuity of the corticomedullary junction. Circumscribed anechoic simple cyst exophytic from the lower pole measures 4.1 x 3.7 cm. There is a second smaller anechoic simple cyst which measures 2.2 x 1.9 x 1.8 cm exophytic from the interpolar region. Left Kidney: Length: 11.7 cm. No hydronephrosis. Echogenic renal parenchyma with increased conspicuity of the corticomedullary junction. Bladder: Decompressed with Foley catheter in place. IMPRESSION: 1. No evidence of hydronephrosis. 2. Echogenic renal parenchyma consistent with underlying medical renal disease. 3. Simple right-sided renal cysts have slowly enlarged compared to prior imaging from July of 2006. Electronically Signed   By: Jacqulynn Cadet M.D.   On: 11/13/2015 12:37   Dg Chest Port 1 View  11/11/2015  CLINICAL DATA:  Cough today. Increasing shortness of breath tonight. EXAM: PORTABLE CHEST 1 VIEW COMPARISON:  01/16/2015 FINDINGS: Borderline heart size and pulmonary vascularity. Increased density over the lung bases suggesting edema or pneumonia. No blunting of costophrenic angles. No pneumothorax. Cardiac pacemaker. Degenerative changes in the spine. IMPRESSION: Increased density over the lung bases suggests edema or pneumonia. Borderline heart size. Electronically Signed   By: Lucienne Capers M.D.   On: 11/11/2015 00:34    2D Echo: Study Conclusions  - Left ventricle: The cavity size was normal. There was mild concentric hypertrophy. Systolic function was severely reduced. The estimated ejection fraction was in the range of 20% to 25%. There is akinesis of the entireapical myocardium. There is akinesis of the mid inferoseptal and apical septal myocardium. There is akinesis of the mid-apicalinferior myocardium. There is akinesis of the mid-apicallateral myocardium. There is akinesis of the entireanterior myocardium. There is akinesis of the apicalinferolateral myocardium. There is akinesis of the midanteroseptal myocardium. There was fusion of early and atrial contributions to ventricular filling. - Aortic valve: The peak velocity and mean AV gradient may be underestimated due to underlying severe LV dysfunction thereby underestimating the degree of AS. Severely calcified annulus. Trileaflet. Moderate diffuse thickening and calcification. Valve mobility was restricted. There was mild stenosis. Valve area (VTI): 1.55 cm^2. Valve area (Vmax): 1.56 cm^2. Valve area (Vmean): 1.44 cm^2. -  Mitral valve: Mild focal calcification of the anterior leaflet. There was trivial regurgitation. - Pericardium, extracardiac: A moderate, free-flowing pericardial effusion was identified circumferential to the heart. The fluid had no internal echoes.  Admission HPI: Patient is a 82 year old male with a past medical history of coronary artery disease, myocardial infarction, RBBB, s/p AICD in 2014, PTCA stent in 2004, ischemic cardiomyopathy, pericardial effusion, combined systolic and diastolic CHF, diabetes, hypertension, hyperlipidemia, [name] stage III, anemia, and TIA presenting to the hospital from home via EMS with a chief complaint of acute onset shortness of breath and cough. Patient states he went to visit his friend yesterday and had pizza.  States he experienced acute onset shortness of breath and cough productive of "frothy" sputum when he was trying to get out of his [place] to get into the house around 8 PM last night. Denies having any associated chest pain or dizziness. Also reports having leg swelling and is not sure for how long. Patient states normally he does not have cough or shortness of breath and is not on home oxygen. Reports using one pillow at night to sleep. Reports being compliant with his Lasix prescription (Lasix 40 mg twice daily). Denies having any fevers, chills, recent illness, or flu-like symptoms. Denies any history of blood clots. No other complaints.   Patient received 2 sublingual nitroglycerin tablets and was started on BiPAP in route to the hospital. Upon presentation to the ED, patient was noted to be tachypneic with a respiratory rate of 28. Chest x-ray showing pulmonary edema and BNP 383. He was given IV furosemide 40 mg in the ED and started on Nitroglycerin gtt. Echo from January 2016 showing left ventricular ejection fraction of 123456, grade 1 diastolic dysfunction, mild aortic stenosis, and a large pericardial effusion.   Hospital Course by problem list:   Acute on chronic combined systolic and diastolic congestive heart failure: 82 year old man with ischemic systolic congestive heart failure with an ejection fraction around 25% presented to the emergency department with shortness of breath due to pulmonary edema and volume overload. At baseline his NYHA symptoms are class II. Significant 3+ pitting edema to knees bilaterally on arrival with diffuse crackles. He was started on lasix 80 mg IV and nitroglycerin infusion for the pulmonary edema. He had good diuresis but had significant hypertension A999333 systolic. Attempted to wean the nitro drip but nursing were concerned about his BP overnight and it was left on for 24 hours. Increased his home lisinopril form 5 to 10 mg but there was an unfortunate ordering error  and he received 20 mg of lisinopril instead of 10 mg. The following day his Cr declined from 1.4 to 2.4. There was a hypotensive episode recorded to 52 systolic but improved to normal range afterwards. We held lasix and lisinopril. Cr worsened to 3.5 before slowly improving back to 2.5 at discharge. Weight was 168 lb on admission and improved to 165 lb at discharge. His SOB improved with no crackles on exam and 1+ pitting edema at time of discharge. Restarted his home dose lisinopril and lasix at discharge. Instructed to check daily weights with close follow up in clinic.  AKI on [name] stage III - Cr 1.4 on admission but declined to 3.56 after receiving lasix 80 mg IV x 3 doses and inadvertently receiving 20 mg of lisinopril. Held lasix and lisinopril and Cr slowly improved back to 2.5 but still above his baseline at discharge. Restarted home dose medications at discharge.   Acute Urinary Retention: Patient  with complaints of acute urinary retention. Patient was unable to void on day of discharge. PVR with ~350 mL of urine. Foley was placed and care instructions were given. Will need urology follow up.   UTI: UA showed large Hgb, positive nitrite, large leukocytes, few bacteria, 0-5 squams and hyaline casts. Patient denied any urinary symptoms but was unable to void on the day of discharge. No fevers or chills during admission. Urine culture growing coag negative staph. He was started on Keflex 500 mg bid for a 10 day course.   Coronary artery disease: MI in 1990 with last cardiac cath 07/09/2003 notable for three-vessel coronary disease, moderate pulmonary hypertension. Continued home dose aspirin 81 mg daily and Coreg. Held lisinopril as noted above, restarted at discharge.   Peripheral vascular disease: s/p PCI to the L ICA on 09/11/2003 for 90% concentric narrowing of the proximal segment. Continued ASA.  COPD: No PFTs on file. Albuterol nebulizer 5 mg every 6 hours as needed  DM2 - A1c 9.7, worse  from 8.8 in July 2016. Patient takes Lantus 30 units at bedtime at home. Discharged on home dose insulin.   Hypertension: BP initially elevated into A999333 systolic. Improved and was 120-130s. Coreg, lisinopril as noted above.  Hyperlipidemia Continued home medication Simvastatin 40 mg daily  History of ischemic cardiomyopathy - Echo done 2/16 shows EF of 20-25% unchanged from January 2016. ASA, Lipitor, lisinopril, Coreg as noted above  Discharge Vitals:   BP 99/42 mmHg  Pulse 64  Temp(Src) 98.3 F (36.8 C) (Oral)  Resp 16  Ht 5\' 10"  (1.778 m)  Wt 165 lb 3.2 oz (74.934 kg)  BMI 23.70 kg/m2  SpO2 98%  Discharge Labs:  No results found for this or any previous visit (from the past 24 hour(s)).  Signed: Maryellen Pile, MD 11/16/2015, 11:35 AM

## 2015-11-15 NOTE — Care Management Note (Signed)
Case Management Note  Patient Details  Name: [name] MRN: [id number] Date of Birth: 1930-11-03  Subjective/Objective:        Admitted with Acute CHF            Action/Plan: Patient lives in a Sr Living apt, is active with the [employer] and he receives a hot meal from the [employer] M-[employer] ( no seasoning/ salt); patient states that he cooks his breakfast and dinner. He remains active in his church and has a girlfriend of 3 yrs and they travel a lot together; Patient stated " we just went on a cruise together." He use the bus for transportation. Patient is refusing SNF, stated, " I want to come and go as I please." Patient is agreeable to [facility] services, choice offered, patient chose [facility]; Mary with [facility] called for arrangements. Patient is also agreeable to [place] program for CHF - referral made. Rolling walker ordered as requested. Patient is very pleasant and talks about how he wants to enjoy his life.  Expected Discharge Date:    possibly 11/15/2015              Expected Discharge Plan:  [place]  In-House Referral:   [facility]  Discharge planning Services  CM Consult  Choice offered to:  Patient  DME Arranged:  Walker rolling DME Agency:  [facility] Arranged:  RN, Disease Management, PT [place] Agency:  [facility]  Status of Service:  In process, will continue to follow  Sherrilyn Rist [phone] 11/15/2015, 10:41 AM

## 2015-11-15 NOTE — Progress Notes (Signed)
Received call from [facility], they are out of network with the patient's insurance; CM talked to patient about another [facility] agency, he chose [facility] with [facility] called for arrangements, they are aware of patient being discharged home with a foley catheter. Mindi Slicker [facility] [phone]

## 2015-11-16 ENCOUNTER — Telehealth: Payer: Self-pay | Admitting: *Deleted

## 2015-11-16 ENCOUNTER — Telehealth: Payer: Self-pay | Admitting: Student in an Organized Health Care Education/Training Program

## 2015-11-16 ENCOUNTER — Encounter (HOSPITAL_COMMUNITY): Payer: Self-pay | Admitting: Emergency Medicine

## 2015-11-16 DIAGNOSIS — I5022 Chronic systolic (congestive) heart failure: Secondary | ICD-10-CM | POA: Diagnosis not present

## 2015-11-16 DIAGNOSIS — I2510 Atherosclerotic heart disease of native coronary artery without angina pectoris: Secondary | ICD-10-CM | POA: Diagnosis not present

## 2015-11-16 DIAGNOSIS — N289 Disorder of kidney and ureter, unspecified: Secondary | ICD-10-CM | POA: Insufficient documentation

## 2015-11-16 DIAGNOSIS — Z9889 Other specified postprocedural states: Secondary | ICD-10-CM | POA: Insufficient documentation

## 2015-11-16 DIAGNOSIS — R269 Unspecified abnormalities of gait and mobility: Secondary | ICD-10-CM | POA: Diagnosis not present

## 2015-11-16 DIAGNOSIS — N400 Enlarged prostate without lower urinary tract symptoms: Secondary | ICD-10-CM | POA: Diagnosis not present

## 2015-11-16 DIAGNOSIS — M545 Low back pain: Secondary | ICD-10-CM | POA: Insufficient documentation

## 2015-11-16 DIAGNOSIS — Z7982 Long term (current) use of aspirin: Secondary | ICD-10-CM | POA: Insufficient documentation

## 2015-11-16 DIAGNOSIS — N329 Bladder disorder, unspecified: Secondary | ICD-10-CM | POA: Diagnosis not present

## 2015-11-16 DIAGNOSIS — Z86010 Personal history of colonic polyps: Secondary | ICD-10-CM | POA: Diagnosis not present

## 2015-11-16 DIAGNOSIS — Z79899 Other long term (current) drug therapy: Secondary | ICD-10-CM | POA: Insufficient documentation

## 2015-11-16 DIAGNOSIS — E119 Type 2 diabetes mellitus without complications: Secondary | ICD-10-CM | POA: Insufficient documentation

## 2015-11-16 DIAGNOSIS — I10 Essential (primary) hypertension: Secondary | ICD-10-CM | POA: Insufficient documentation

## 2015-11-16 DIAGNOSIS — Z8669 Personal history of other diseases of the nervous system and sense organs: Secondary | ICD-10-CM | POA: Insufficient documentation

## 2015-11-16 DIAGNOSIS — Z87438 Personal history of other diseases of male genital organs: Secondary | ICD-10-CM | POA: Insufficient documentation

## 2015-11-16 DIAGNOSIS — Z8719 Personal history of other diseases of the digestive system: Secondary | ICD-10-CM | POA: Insufficient documentation

## 2015-11-16 DIAGNOSIS — Z9861 Coronary angioplasty status: Secondary | ICD-10-CM | POA: Insufficient documentation

## 2015-11-16 DIAGNOSIS — M1990 Unspecified osteoarthritis, unspecified site: Secondary | ICD-10-CM | POA: Insufficient documentation

## 2015-11-16 DIAGNOSIS — Z87891 Personal history of nicotine dependence: Secondary | ICD-10-CM | POA: Insufficient documentation

## 2015-11-16 DIAGNOSIS — E785 Hyperlipidemia, unspecified: Secondary | ICD-10-CM | POA: Diagnosis not present

## 2015-11-16 DIAGNOSIS — Z862 Personal history of diseases of the blood and blood-forming organs and certain disorders involving the immune mechanism: Secondary | ICD-10-CM | POA: Insufficient documentation

## 2015-11-16 DIAGNOSIS — Z8673 Personal history of transient ischemic attack (TIA), and cerebral infarction without residual deficits: Secondary | ICD-10-CM | POA: Diagnosis not present

## 2015-11-16 DIAGNOSIS — Z794 Long term (current) use of insulin: Secondary | ICD-10-CM | POA: Diagnosis not present

## 2015-11-16 DIAGNOSIS — Z95810 Presence of automatic (implantable) cardiac defibrillator: Secondary | ICD-10-CM | POA: Diagnosis not present

## 2015-11-16 DIAGNOSIS — R319 Hematuria, unspecified: Secondary | ICD-10-CM | POA: Diagnosis not present

## 2015-11-16 DIAGNOSIS — I252 Old myocardial infarction: Secondary | ICD-10-CM | POA: Insufficient documentation

## 2015-11-16 LAB — COMPREHENSIVE METABOLIC PANEL
ALBUMIN: 3 g/dL — AB (ref 3.5–5.0)
ALT: 25 U/L (ref 17–63)
ANION GAP: 14 (ref 5–15)
AST: 38 U/L (ref 15–41)
Alkaline Phosphatase: 64 U/L (ref 38–126)
BILIRUBIN TOTAL: 0.7 mg/dL (ref 0.3–1.2)
BUN: 47 mg/dL — AB (ref 6–20)
CALCIUM: 9.7 mg/dL (ref 8.9–10.3)
CHLORIDE: 99 mmol/L — AB (ref 101–111)
CO2: 25 mmol/L (ref 22–32)
CREATININE: 2.92 mg/dL — AB (ref 0.61–1.24)
GFR calc Af Amer: 22 mL/min — ABNORMAL LOW (ref >60)
GFR, EST NON AFRICAN AMERICAN: 19 mL/min — AB (ref >60)
GLUCOSE: 209 mg/dL — AB (ref 65–99)
POTASSIUM: 4.8 mmol/L (ref 3.5–5.1)
Sodium: 138 mmol/L (ref 135–145)
TOTAL PROTEIN: 7.4 g/dL (ref 6.5–8.1)

## 2015-11-16 LAB — CBC
HCT: 40.7 % (ref 39.0–52.0)
Hemoglobin: 13.1 g/dL (ref 13.0–17.0)
MCH: 30 pg (ref 26.0–34.0)
MCHC: 32.2 g/dL (ref 30.0–36.0)
MCV: 93.3 fL (ref 78.0–100.0)
PLATELETS: 208 10*3/uL (ref 150–400)
RBC: 4.36 MIL/uL (ref 4.22–5.81)
RDW: 14 % (ref 11.5–15.5)
WBC: 12.8 10*3/uL — ABNORMAL HIGH (ref 4.0–10.5)

## 2015-11-16 LAB — URINE CULTURE: SPECIAL REQUESTS: NORMAL

## 2015-11-16 LAB — URINE MICROSCOPIC-ADD ON

## 2015-11-16 LAB — I-STAT CG4 LACTIC ACID, ED: LACTIC ACID, VENOUS: 1.29 mmol/L (ref 0.5–2.0)

## 2015-11-16 LAB — URINALYSIS, ROUTINE W REFLEX MICROSCOPIC
Glucose, UA: NEGATIVE mg/dL
KETONES UR: NEGATIVE mg/dL
NITRITE: NEGATIVE
Protein, ur: 100 mg/dL — AB
Specific Gravity, Urine: 1.016 (ref 1.005–1.030)
pH: 5 (ref 5.0–8.0)

## 2015-11-16 MED ORDER — CIPROFLOXACIN HCL 250 MG PO TABS: 250.0000 mg | ORAL_TABLET | Freq: Two times a day (BID) | ORAL | Status: AC

## 2015-11-16 NOTE — Telephone Encounter (Signed)
[facility], sandra calls and states pt's apt is completely covered in bugs, she cannot enter the apt due to this, she does state his back is causing him a great deal of discomfort, she is advising pt to be taken to [place] for eval of pain Spoke to dr Evette Doffing and he states pt did not c/o pain in lower back while in hosp. Pt does have a f/c in place at this time Spoke w/ sandra again, she states pt is having difficulty standing from the wh/ch and states he has been in 1 place all day due to not being able to move. Family is transporting him to ED at this time

## 2015-11-16 NOTE — Telephone Encounter (Signed)
82 year old man recently admitted with volume overload and complicated by urinary retention in the hospital. Urinalysis was pyuric and bacteruric, so started on empiric antibiotics before discharge. Urine culture now positive for coag negative staph. I am going to change antibiotic to Ciprofloxacin based on renal function and sensitivities for a 10 day course.

## 2015-11-16 NOTE — Telephone Encounter (Signed)
[name], pt states when he arrived home from [place]. His apt was full of bedbugs and roaches. Wants to know if there is something you can do. He has appt 2/22 at 1415 with dr Marvel Plan for back pain

## 2015-11-16 NOTE — ED Notes (Signed)
Pt sts back pain all over starting today; pt denies injury; pt recently hospitalized

## 2015-11-16 NOTE — ED Notes (Signed)
Pt noted to have foley bag

## 2015-11-16 NOTE — Telephone Encounter (Signed)
This patient is scheduled with Dr. [name] Plummer tomorrow actually. I have cc'ed Dr. [name] Plummer so she can see this message.

## 2015-11-16 NOTE — Telephone Encounter (Signed)
Transition Care Management Follow-up Telephone Call   Date discharged? 11/15/2015   How have you been since you were released from the hospital? Lower back hurts, feels it when he has BM, sharp pain 10/10   Do you understand why you were in the hospital? Yes, fluid too much, feet and legs swollen   Do you understand the discharge instructions? "They didn't give me any"   Where were you discharged to? home   Items Reviewed:  Medications reviewed: yes  Allergies reviewed: yes  Dietary changes reviewed: yes  Referrals reviewed: yes, HHN needed   Functional Questionnaire:   Activities of Daily Living (ADLs):   He states they are independent in the following: needs help due to pain States they require assistance with the following: needs help with all   Any transportation issues/concerns?: no   Any patient concerns? Yes, bed bugs and roaches are every where since i came back, ive never had a problem before   Confirmed importance and date/time of follow-up visits scheduled yes  Provider Appointment booked with  Confirmed with patient if condition begins to worsen call PCP or go to the ER.  Patient was given the office number and encouraged to call back with question or concerns.  : appt given for back pain 2/22 at 1415 dr Marvel Plan  CONCERNS: 1) [place] 2) [place] 3) BACK PAIN

## 2015-11-17 ENCOUNTER — Encounter (HOSPITAL_COMMUNITY): Payer: Self-pay | Admitting: Radiology

## 2015-11-17 ENCOUNTER — Emergency Department (HOSPITAL_COMMUNITY): Payer: Commercial Managed Care - HMO

## 2015-11-17 ENCOUNTER — Ambulatory Visit: Payer: Commercial Managed Care - HMO | Admitting: Internal Medicine

## 2015-11-17 ENCOUNTER — Emergency Department (HOSPITAL_COMMUNITY)
Admission: EM | Admit: 2015-11-17 | Discharge: 2015-11-18 | Disposition: A | Discharge status: Home / Self Care | Payer: Commercial Managed Care - HMO | Attending: Emergency Medicine | Admitting: Emergency Medicine

## 2015-11-17 DIAGNOSIS — R319 Hematuria, unspecified: Secondary | ICD-10-CM

## 2015-11-17 DIAGNOSIS — N329 Bladder disorder, unspecified: Secondary | ICD-10-CM | POA: Diagnosis not present

## 2015-11-17 DIAGNOSIS — M545 Low back pain, unspecified: Secondary | ICD-10-CM

## 2015-11-17 DIAGNOSIS — N400 Enlarged prostate without lower urinary tract symptoms: Secondary | ICD-10-CM | POA: Diagnosis not present

## 2015-11-17 DIAGNOSIS — N289 Disorder of kidney and ureter, unspecified: Secondary | ICD-10-CM

## 2015-11-17 MED ORDER — LISINOPRIL 10 MG PO TABS
5.0000 mg | ORAL_TABLET | Freq: Every day | ORAL | Status: DC
  Administered 2015-11-17 – 2015-11-18 (×2): 5 mg via ORAL
  Filled 2015-11-17 (×2): qty 1

## 2015-11-17 MED ORDER — PANTOPRAZOLE SODIUM 40 MG PO TBEC
40.0000 mg | DELAYED_RELEASE_TABLET | Freq: Every day | ORAL | Status: DC
  Administered 2015-11-17 – 2015-11-18 (×2): 40 mg via ORAL
  Filled 2015-11-17 (×2): qty 1

## 2015-11-17 MED ORDER — HYDROCORTISONE ACETATE 25 MG RE SUPP
25.0000 mg | Freq: Once | RECTAL | Status: AC
  Administered 2015-11-18: 25 mg via RECTAL
  Filled 2015-11-17: qty 1

## 2015-11-17 MED ORDER — SIMVASTATIN 40 MG PO TABS
40.0000 mg | ORAL_TABLET | Freq: Every day | ORAL | Status: DC
  Administered 2015-11-17: 40 mg via ORAL
  Filled 2015-11-17 (×2): qty 1

## 2015-11-17 MED ORDER — ASPIRIN EC 81 MG PO TBEC: 81.0000 mg | DELAYED_RELEASE_TABLET | Freq: Every day | ORAL | Status: DC

## 2015-11-17 MED ORDER — OXYCODONE-ACETAMINOPHEN 5-325 MG PO TABS
1.0000 | ORAL_TABLET | Freq: Once | ORAL | Status: AC
  Administered 2015-11-17: 1 via ORAL
  Filled 2015-11-17: qty 1

## 2015-11-17 MED ORDER — INSULIN GLARGINE 100 UNIT/ML ~~LOC~~ SOLN
30.0000 [IU] | Freq: Every day | SUBCUTANEOUS | Status: DC
  Administered 2015-11-18: 30 [IU] via SUBCUTANEOUS
  Filled 2015-11-17 (×2): qty 0.3

## 2015-11-17 MED ORDER — CIPROFLOXACIN HCL 500 MG PO TABS
250.0000 mg | ORAL_TABLET | Freq: Two times a day (BID) | ORAL | Status: DC
  Administered 2015-11-17 – 2015-11-18 (×3): 250 mg via ORAL
  Filled 2015-11-17 (×3): qty 1

## 2015-11-17 MED ORDER — ALBUTEROL SULFATE HFA 108 (90 BASE) MCG/ACT IN AERS: 1.0000 | INHALATION_SPRAY | RESPIRATORY_TRACT | Status: DC | PRN

## 2015-11-17 MED ORDER — FUROSEMIDE 20 MG PO TABS
40.0000 mg | ORAL_TABLET | Freq: Two times a day (BID) | ORAL | Status: DC
  Administered 2015-11-17: 40 mg via ORAL
  Filled 2015-11-17 (×2): qty 2

## 2015-11-17 MED ORDER — INSULIN GLARGINE 100 UNIT/ML SOLOSTAR PEN: 30.0000 [IU] | PEN_INJECTOR | Freq: Every day | SUBCUTANEOUS | Status: DC

## 2015-11-17 MED ORDER — CARVEDILOL 12.5 MG PO TABS
25.0000 mg | ORAL_TABLET | Freq: Two times a day (BID) | ORAL | Status: DC
  Administered 2015-11-17: 25 mg via ORAL
  Filled 2015-11-17 (×3): qty 2

## 2015-11-17 NOTE — ED Provider Notes (Signed)
CSN: [id number]     Arrival date & time 11/16/15  [id number] History  By signing my name below, I, Evelene Croon, attest that this documentation has been prepared under the direction and in the presence of Delora Fuel, MD . Electronically Signed: Evelene Croon, Scribe. 11/17/2015. 12:44 AM.     Chief Complaint  Patient presents with  . Back Pain   The history is provided by the patient. No language interpreter was used.     HPI Comments:  [name] is a 82 y.o. male with an extensive PMHx including:HTN, DM and MI,  who presents to the Emergency Department complaining of 10/10 lower back pain that began yesterday. His pain is exacerbated with movement. Pt denies nausea, fever, and chills. No alleviating factors noted.  Past Medical History  Diagnosis Date  . Anemia   . Cataracts, bilateral   . Chronic systolic CHF (congestive heart failure) ([place])     a. ischemic CM EF 15-20%;  b. s/p AICD 05/24/04;  c. Echo 7/06: EF 30-40%, mild reduced RVSF  . CAD (coronary artery disease)     a. s/p AMI, s/p PTCA & stent of cx 12/04;  b. LHC (5/05): Proximal LAD 100% with bridging collaterals (CTO), proximal circumflex 20%, mid circumflex 95% ISR, proximal-mid RCA 75%, EF 20% >>PCI: 3.0 x 28 mm Cypher DES to the mid CFX  . Diverticulosis of colon   . Hyperlipidemia   . PVD (peripheral vascular disease) ([place])     s/p L carotid PTCA/stent 2004  . Transient ischemic attack   . Erectile dysfunction   . Hyperkalemia 08/2008    K=5.7   . BBB (bundle branch block)     right  . HTN (hypertension)   . Elevated PSA   . Adenomatous colon polyp 02/14/2012  . ICD (implantable cardiac defibrillator) in place 12-25-2012    MDT CRTD upgrade by Dr Lovena Le  . Pacemaker   . Type II diabetes mellitus ([place])   . Myocardial infarction ([place]) 1990  . Arthritis     "right hip" (12/25/2012)  . Carotid stenosis     a. s/p L carotid stent 2004;  b. Carotid US (09/2014): Bilateral ICA 1-39%, left ECA >59%, normal subclavian  bilaterally, occluded left vertebral >> FU 2 years  . Pericardial effusion     Echocardiogram (09/2014): EF 25% with distal anterior, distal inferior, distal lateral and apical akinesis, grade 1 diastolic dysfunction, very mild aortic stenosis (mean 7 mmHg) - this may be depressed due to low EF (2-D images suggest mild to moderate aortic stenosis), large pericardial effusion, no RA collapse   Past Surgical History  Procedure Laterality Date  . Cardiac defibrillator placement  05/24/2004    Implantation of a MDT single-chamber defibrillator  . Carotid stent  09/11/2003    Percutaneous transluminal angioplasty and stent placement of the left internal carotid artery.  . Biv icd upgrade  12/25/2012    MDT CRTD upgrade by Dr Lovena Le for ischemic cardiomyopathy and worsening conduction system disease  . Cataract extraction w/ intraocular lens  implant, bilateral Bilateral ~ 2010  . Cardiac catheterization  06/2003,  01/2004  . Coronary angioplasty with stent placement  1990    "2" (12/25/2012)  . Bi-ventricular implantable cardioverter defibrillator upgrade N/A 12/25/2012    Procedure: BI-VENTRICULAR IMPLANTABLE CARDIOVERTER DEFIBRILLATOR UPGRADE;  Surgeon: Evans Lance, MD;  Location: [facility] CATH LAB;  Service: Cardiovascular;  Laterality: N/A;  . Lead revision N/A 12/25/2012    Procedure: LEAD REVISION;  Surgeon:  Evans Lance, MD;  Location: [facility] CATH LAB;  Service: Cardiovascular;  Laterality: N/A;   Family History  Problem Relation Age of Onset  . Diabetes Mother   . Diabetes Brother   . Heart attack Neg Hx   . Stroke Neg Hx    Social History  Substance Use Topics  . Smoking status: Former Smoker    Types: Cigarettes    Quit date: 12/27/1967  . Smokeless tobacco: Never Used  . Alcohol Use: No     Comment: 12/25/2012 "quit all alcohol 60 yr ago"    Review of Systems  Constitutional: Negative for fever and chills.  Gastrointestinal: Negative for nausea.  Musculoskeletal: Positive for back pain.   All other systems reviewed and are negative.  Allergies  Review of patient's allergies indicates no known allergies.  Home Medications   Prior to Admission medications   Medication Sig Start Date End Date Taking? Authorizing Provider  albuterol (VENTOLIN HFA) 108 (90 BASE) MCG/ACT inhaler Inhale 1-2 puffs into the lungs every 4 (four) hours as needed for wheezing or shortness of breath. 01/05/15  Yes [name], MD  aspirin EC 81 MG tablet Take 81 mg by mouth daily.   Yes Historical Provider, MD  carvedilol (COREG) 25 MG tablet TAKE 1 TABLET BY MOUTH TWICE DAILY WITH A MEAL 05/07/15  Yes Maryellen Pile, MD  ciprofloxacin (CIPRO) 250 MG tablet Take 1 tablet (250 mg total) by mouth 2 (two) times daily. 11/16/15 11/26/15 Yes Axel Filler, MD  furosemide (LASIX) 40 MG tablet Take 1 tablet (40 mg total) by mouth 2 (two) times daily. 11/30/14  Yes Peter M Martinique, MD  Insulin Glargine (LANTUS SOLOSTAR) 100 UNIT/ML Solostar Pen Inject 30 Units into the skin at bedtime. 05/07/15  Yes Maryellen Pile, MD  lisinopril (PRINIVIL,ZESTRIL) 5 MG tablet Take 1 tablet (5 mg total) by mouth daily. 05/07/15  Yes Maryellen Pile, MD  pantoprazole (PROTONIX) 40 MG tablet TAKE 1 TABLET BY MOUTH DAILY 03/02/15  Yes Sid Falcon, MD  simvastatin (ZOCOR) 40 MG tablet TAKE 1 TABLET BY MOUTH DAILY 09/29/15  Yes Axel Filler, MD  B-D ULTRAFINE III SHORT PEN 31G X 8 MM MISC USE AS DIRECTED ONCE DAILY FOR INSULIN INJECTION 09/10/15   Axel Filler, MD   BP 107/54 mmHg  Pulse 68  Temp(Src) 98 F (36.7 C) (Oral)  Resp 18  SpO2 94% Physical Exam  Constitutional: He is oriented to person, place, and time. He appears well-developed and well-nourished. No distress.  HENT:  Head: Normocephalic and atraumatic.  Eyes: Conjunctivae and EOM are normal. Pupils are equal, round, and reactive to light.  Neck: Normal range of motion. Neck supple. No JVD present.  Cardiovascular: Normal rate, regular rhythm and  normal heart sounds.   No murmur heard. Pulmonary/Chest: Effort normal and breath sounds normal. He has no wheezes. He has no rales. He exhibits no tenderness.  Abdominal: Soft. He exhibits no distension and no mass. There is tenderness in the suprapubic area. There is CVA tenderness. There is no rebound and no guarding.  suprapubic and bilateral CVA tenderness  Musculoskeletal: Normal range of motion. He exhibits no edema.  Positive straight leg raise bilaterally at 30 degrees  Lymphadenopathy:    He has no cervical adenopathy.  Neurological: He is alert and oriented to person, place, and time. No cranial nerve deficit. He exhibits normal muscle tone. Coordination normal.  Skin: Skin is warm and dry. No rash noted.  Psychiatric: He has a  normal mood and affect.  Nursing note and vitals reviewed.   ED Course  Procedures   DIAGNOSTIC STUDIES:  Oxygen Saturation is 99% on RA, normal by my interpretation.    COORDINATION OF CARE:  12:40 AM Will order pain meds and CT renal study.  Discussed treatment plan with pt at bedside and pt agreed to plan.  Labs Review Labs Reviewed  COMPREHENSIVE METABOLIC PANEL - Abnormal; Notable for the following:    Chloride 99 (*)    Glucose, Bld 209 (*)    BUN 47 (*)    Creatinine, Ser 2.92 (*)    Albumin 3.0 (*)    GFR calc non Af Amer 19 (*)    GFR calc Af Amer 22 (*)    All other components within normal limits  CBC - Abnormal; Notable for the following:    WBC 12.8 (*)    All other components within normal limits  URINALYSIS, ROUTINE W REFLEX MICROSCOPIC (NOT AT [facility]) - Abnormal; Notable for the following:    Color, Urine AMBER (*)    APPearance TURBID (*)    Hgb urine dipstick LARGE (*)    Bilirubin Urine SMALL (*)    Protein, ur 100 (*)    Leukocytes, UA MODERATE (*)    All other components within normal limits  URINE MICROSCOPIC-ADD ON - Abnormal; Notable for the following:    Squamous Epithelial / LPF 0-5 (*)    Bacteria, UA FEW  (*)    All other components within normal limits  I-STAT CG4 LACTIC ACID, ED  I-STAT CG4 LACTIC ACID, ED    Imaging Review Ct Renal Stone Study  11/17/2015  CLINICAL DATA:  Acute onset of generalized back pain. Initial encounter. EXAM: CT ABDOMEN AND PELVIS WITHOUT CONTRAST TECHNIQUE: Multidetector CT imaging of the abdomen and pelvis was performed following the standard protocol without IV contrast. COMPARISON:  Renal ultrasound performed 11/13/2015 FINDINGS: Minimal left basilar atelectasis is noted. Pacemaker/AICD leads are noted. A small to moderate pericardial effusion is noted. Diffuse coronary artery calcifications are seen. The liver and spleen are unremarkable in appearance. The gallbladder is within normal limits. The pancreas and adrenal glands are unremarkable. Nonspecific perinephric stranding is noted bilaterally. Several left renal cysts are seen, measuring up to 4.2 cm in size. A small hyperdense cyst is noted at the upper pole of the left kidney. There is no evidence of hydronephrosis. No renal or ureteral stones are identified. No free fluid is identified. The small bowel is unremarkable in appearance. The stomach is within normal limits. No acute vascular abnormalities are seen. Scattered calcification is noted along the abdominal aorta and its branches. The appendix is normal in caliber, without evidence of appendicitis. Scattered diverticulosis is noted along the ascending and sigmoid colon, without evidence of diverticulitis. The bladder is largely decompressed. Bladder wall thickening and surrounding inflammation raise concern for cystitis. The prostate is significantly enlarged, measuring 6.9 cm in transverse dimension. A Foley catheter is noted in expected position. No inguinal lymphadenopathy is seen. Trace fluid is noted extending into a small right inguinal hernia. No bowel herniation is seen. No acute osseous abnormalities are identified. Anterior bridging osteophytes are noted  along the lower thoracic and lumbar spine. IMPRESSION: 1. Bladder wall thickening and surrounding inflammation, concerning for cystitis. 2. Nonspecific bilateral perinephric stranding. Would correlate any evidence of pyelonephritis. 3. Significantly enlarged prostate noted.  Would correlate with PSA. 4. Diffuse coronary artery calcifications seen. 5. Small moderate pericardial effusion is noted. 6. Left renal  cysts noted. 7. Scattered calcification along the abdominal aorta and its branches. Electronically Signed   By: Garald Balding M.D.   On: 11/17/2015 01:48   I have personally reviewed and evaluated these images and lab results as part of my medical decision-making.  MDM   Final diagnoses:  Bilateral low back pain without sciatica  Hematuria  Renal insufficiency    Low back pain which appears to be musculoskeletal. However, significant hematuria his presence was sent for CT renal stone protocol which shows no evidence of renal calculi. Bladder wall thickening is noted and this is felt to be likely related to his indwelling Foley catheter. Urinalysis shows no evidence of actual infection. Patient was given dose of oxycodone-acetaminophen with significant relief of pain. However, when I was ready to discharge him, his family member states that he lives alone and is not able to care for himself and family members are not able to care for him either. Is requesting patient be placed in a rehabilitation facility. I've contacted the internal medicine teaching service gather the cyst with placement and he is held in the ED overnight for social service consultation in the morning. Old records are reviewed showing recent hospitalization for CHF exacerbation. This admission is recent enough that it should qualify for Medicare to help with nursing home care.  I personally performed the services described in this documentation, which was scribed in my presence. The recorded information has been reviewed and is  accurate.       Delora Fuel, MD [id number]

## 2015-11-17 NOTE — ED Provider Notes (Signed)
Plan is for SNF placement.  PT eval to be done.  Dorie Rank, MD 11/17/15 1038

## 2015-11-17 NOTE — ED Notes (Signed)
Assisted pt to the bedside commode.

## 2015-11-17 NOTE — Telephone Encounter (Signed)
In response to pt's inquiry about the bedbugs and roaches, unfortunately there is nothing [facility] can do as the Health Dept does not consider bedbugs/roaches a community health risk.  I have yet to find low cost extermination options.  I would refer him to his landlord or [employer] to help advocate for him.  I see that he is currently in the ED.  I will be more than happy to provide the resources in the mail today so that he may have receive the information upon discharge or provide them during a f/u appointment.  Thank you.

## 2015-11-17 NOTE — ED Notes (Signed)
CSW asking about PT consult,this RN spoke with Jenny Reichmann, PT who reports that she will eval the pt in 10 mins, CSW aware of ETA of PT

## 2015-11-17 NOTE — ED Notes (Signed)
A regular diet ordered for patient for lunch.

## 2015-11-17 NOTE — Evaluation (Signed)
Physical Therapy Evaluation & Discharge Patient Details Name: [name] MRN: [id number] DOB: 04/23/1931 Today's Date: 11/17/2015   History of Present Illness  [name] is a 82 y.o. male with an extensive PMHx including:HTN, DM and MI, who presents to the Emergency Department complaining of 10/10 lower back pain that began yesterday. His pain is exacerbated with movement.  Clinical Impression  Patient presents at supervision to mod I level for mobility.  Feel he is appropriate for HHPT at d/c.  However, his home is currently not safe due to infestation and may need SW assist for custodial care placement versus community resources for housing until his home is treated.  No further skilled acute PT needs at this time.    Follow Up Recommendations Home health PT    Equipment Recommendations  None recommended by PT    Recommendations for Other Services       Precautions / Restrictions Precautions Precautions: Fall      Mobility  Bed Mobility Overal bed mobility: Needs Assistance Bed Mobility: Supine to Sit;Sit to Supine     Supine to sit: Supervision Sit to supine: Min guard   General bed mobility comments: littile difficulty as was getting into high stretcher in the ED, but noted   Transfers Overall transfer level: Needs assistance Equipment used: Rolling walker (2 wheeled) Transfers: Sit to/from Stand Sit to Stand: Supervision         General transfer comment: increased time, cues for hand placement  Ambulation/Gait Ambulation/Gait assistance: Modified independent (Device/Increase time) Ambulation Distance (Feet): 250 Feet   Gait Pattern/deviations: Step-through pattern;Decreased stride length;Trunk flexed     General Gait Details: able to maneuver for wide and short turns without difficutly or LOB  Stairs            Wheelchair Mobility    Modified Rankin (Stroke Patients Only)       Balance Overall balance assessment: Needs  assistance           Standing balance-Leahy Scale: Fair Standing balance comment: Can stand without UE support, but needs RW for ambualtion                             Pertinent Vitals/Pain Faces Pain Scale: Hurts worst Pain Location: back Pain Descriptors / Indicators: Aching Pain Intervention(s): Monitored during session;Repositioned    Home Living Family/patient expects to be discharged to:: Private residence Living Arrangements: Alone   Type of Home: Independent living facility Home Access: Level entry     Home Layout: One level Home Equipment: Walker - 2 wheels      Prior Function Level of Independence: Independent with assistive device(s);Needs assistance         Comments: reports they deliver [place] K[place] to facility each lunch time, girlfriend makes breakfast, c/o needing help with dinner prep and houskeeping     Hand Dominance   Dominant Hand: Right    Extremity/Trunk Assessment               Lower Extremity Assessment: RLE deficits/detail;LLE deficits/detail RLE Deficits / Details: AROM WFL, strength grossly 4/5 LLE Deficits / Details: AROM WFL, strength grossly 4/5  Cervical / Trunk Assessment: Kyphotic;Other exceptions  Communication   Communication: No difficulties  Cognition Arousal/Alertness: Awake/alert Behavior During Therapy: WFL for tasks assessed/performed Overall Cognitive Status: Within Functional Limits for tasks assessed  General Comments General comments (skin integrity, edema, etc.): Reports his home is infested with bed bugs and feels unable to return safely until his apartment is treated    Exercises        Assessment/Plan    PT Assessment All further PT needs can be met in the next venue of care  PT Diagnosis     PT Problem List    PT Treatment Interventions     PT Goals (Current goals can be found in the Care Plan section) Acute Rehab PT Goals PT Goal Formulation: All  assessment and education complete, DC therapy    Frequency     Barriers to discharge        Co-evaluation               End of Session Equipment Utilized During Treatment: Gait belt Activity Tolerance: Patient tolerated treatment well Patient left: in chair;with call bell/phone within reach      Functional Assessment Tool Used: Clinical Judgement Functional Limitation: Mobility: Walking and moving around Mobility: Walking and Moving Around Current Status (G8978): At least 1 percent but less than 20 percent impaired, limited or restricted Mobility: Walking and Moving Around Goal Status (G8979): At least 1 percent but less than 20 percent impaired, limited or restricted Mobility: Walking and Moving Around Discharge Status (G8980): At least 1 percent but less than 20 percent impaired, limited or restricted    Time: 1505-1530 PT Time Calculation (min) (ACUTE ONLY): 25 min   Charges:   PT Evaluation $PT Eval Moderate Complexity: 1 Procedure PT Treatments $Gait Training: 8-22 mins   PT G Codes:   PT G-Codes **NOT FOR INPATIENT CLASS** Functional Assessment Tool Used: Clinical Judgement Functional Limitation: Mobility: Walking and moving around Mobility: Walking and Moving Around Current Status (G8978): At least 1 percent but less than 20 percent impaired, limited or restricted Mobility: Walking and Moving Around Goal Status (G8979): At least 1 percent but less than 20 percent impaired, limited or restricted Mobility: Walking and Moving Around Discharge Status (G8980): At least 1 percent but less than 20 percent impaired, limited or restricted    Cynthia Wynn 11/17/2015, 4:55 PM  Cyndi Wynn, PT [phone] 11/17/2015    

## 2015-11-17 NOTE — Clinical Social Work Note (Signed)
Clinical Social Work Assessment  Patient Details  Name: [name] MRN: [id number] Date of Birth: 27-Jan-1931  Date of referral:  11/17/15               Reason for consult:  Facility Placement                Permission sought to share information with:  Facility Sport and exercise psychologist, Family Supports Permission granted to share information::  Yes, Verbal Permission Granted  Name::     [name] - Girlfriend [phone])  Agency::  Skilled Nursing Facilities  Relationship::     Contact Information:     Housing/Transportation Living arrangements for the past 2 months:  Charity fundraiser of Information:  Patient Patient Interpreter Needed:  None Criminal Activity/Legal Involvement Pertinent to Current Situation/Hospitalization:  No - Comment as needed Significant Relationships:  Siblings, Significant Other Lives with:  Self Do you feel safe going back to the place where you live?  Yes Need for family participation in patient care:  No (Coment)  Care giving concerns:  Per record, Patient's family member states that Patient lives alone and is not able to care for himself and family members are not able to care for him either. Family and patient are requesting a temporary rehabilitation facility.   Social Worker assessment / plan:  Patient is an 82 YO African American male who presents to the Emergency Department with complaints of 10/10 lower back pain that began yesterday. CSW engaged with Patient at his bedside to introduce self, role of CSW, and discuss SNF placement. Patient reports that he is a resident of [facility] and is active with [facility]. Patient reports that he receives daily lunches from [employer] except for weekends and holidays. Patient reports that he cooks his own breakfast and dinner. Patient reports that he is very active at his church. Patient reports that he also has a girlfriend, [name].  Patient utilizes the bus for transportation. Patient is very pleasant and engaging. Patient reports that he is interested in [facility] as he had a friend that was there. Patient emphasizes that he would like this to be temporary placement until he can get back to baseline. CSW will proceed with placement efforts.   Employment status:  Retired Nurse, adult PT Recommendations:  Not assessed at this time [place] / Referral to community resources:  [place]  Patient/Family's Response to care:  Patient reports that he would like to return to prior level of independence post temporary SNF placement. Patient reports that he would also like home health services post discharge from SNF. Patient appreciative of care at this time.   Patient/Family's Understanding of and Emotional Response to Diagnosis, Current Treatment, and Prognosis:  Patient is very lucid. Patient verbalizes a strong understanding of his current diagnosis, treatment, and prognosis. He responds in a positive manner and verbalizes no anxiety. Patient reports strong supports currently in place.   Emotional Assessment Appearance:  Appears stated age Attitude/Demeanor/Rapport:   (Engaging; Pleasant; Cooperative) Affect (typically observed):  Appropriate, Hopeful, Stable Orientation:  Oriented to Self, Oriented to Place, Oriented to Situation, Oriented to  Time Alcohol / Substance use:    Psych involvement (Current and /or in the community):  No (Comment)  Discharge Needs  Concerns to be addressed:  No discharge needs identified Readmission within the last 30 days:  Yes Current discharge risk:  None Barriers to Discharge:  No Barriers Identified  Judeth Horn, LCSW 11/17/2015, 9:56 AM

## 2015-11-18 DIAGNOSIS — M545 Low back pain: Secondary | ICD-10-CM | POA: Diagnosis not present

## 2015-11-18 LAB — CBG MONITORING, ED: Glucose-Capillary: 239 mg/dL — ABNORMAL HIGH (ref 65–99)

## 2015-11-18 NOTE — ED Notes (Signed)
Pt assisted to wheelchair with standby assist. Pt wheeled to lobby.

## 2015-11-18 NOTE — Progress Notes (Signed)
CSW engaged with Patient's Apartment's maintenance who reports that they are actively working on the bed bug infestation alongside Patient's brother and sister. Maintenance reports that the Patient's apartment will be ready for his return tomorrow afternoon. CSW called Patient's sister and brother re: Patient being discharged today with home health services. Per brother, he told the doctor when he brought him in that he would need to stay until Saturday so that his apartment could be exterminated. CSW explained to brother and sister that Patient was medically cleared and was seen by physical therapy who recommended that Patient be discharged home with home health and that Patient has been set up with [facility]. CSW explained to Patient's family that they could private pay for a facility. CSW informed brother and sister that they would need to make arrangements to come and get him and either place him in a hotel until his apartment is cleaned or allow him to stay with family. Patient's brother and sister verbalized understanding and reports that they will be by to pick up the patient today. They were unable to provide CSW with a time. CSW will follow up if Patient remains here after 1PM. CSW will continue to follow.

## 2015-11-18 NOTE — Progress Notes (Signed)
Physical Therapy is recommending Patient follow up with Home Health PT rather than SNF placement at this time. Per PT "Patient presents at supervision to mod I level for mobility. Feel he is appropriate for HHPT at d/c. However, his home is currently not safe due to infestation and may need SW assist for custodial care placement versus community resources for housing until his home is treated. No further skilled acute PT needs at this time."  Unfortunately, Patient's insurance will not authorize SNF with PT recommendation of Home Health PT. CSW attempted Patient's independent living community re: infestation x2 but was unsuccessful. CSW will coordinate with RN Case Manager to set up home health services. CSW will continue to follow for disposition.    Holly Bodily, LCSW [facility] ED/ [place] Social Worker [phone]

## 2015-11-18 NOTE — Discharge Instructions (Signed)

## 2015-11-18 NOTE — ED Notes (Signed)
Called pharmacy for medications.  

## 2015-11-18 NOTE — ED Notes (Signed)
Discussed plan of care with Caryl Pina, SW and Dr. Thomasene Lot - pt does not meet SNF criteria. Case management will set up home health for pt and then pt will be discharged home.

## 2015-11-18 NOTE — ED Notes (Signed)
Assisted pt to bedside commode, pt was very unstable on his feet.  RN had to steady pt while standing and assist to position in bed.  Pt was unable to clean self.

## 2015-11-18 NOTE — ED Notes (Signed)
Called pharmacy for Lantus

## 2015-11-18 NOTE — ED Notes (Signed)
This RN and Caryl Pina, SW spoke w/ pt son and daughter. Son or daughter will pick up pt and state pt can wait in lobby until they are able to take him home. Pt will stay with family member until apartment is no longer infested - company coming to clean of roaches and bedbugs, apartment will be ready tomorrow afternoon. Pt family and pt verbalized understanding.

## 2015-11-18 NOTE — ED Notes (Signed)
Patient was given a warm blanket, patient sitting in chair at this time.

## 2015-11-18 NOTE — Telephone Encounter (Signed)
Pt has been adm to ED for snf placement or rehab

## 2015-11-19 ENCOUNTER — Telehealth: Payer: Self-pay | Admitting: Student in an Organized Health Care Education/Training Program

## 2015-11-19 NOTE — Progress Notes (Signed)
No ICM transmission received for scheduled date 11/10/2015.  Patient has had 2 hospitalizations in last 2 weeks, one related to CHF.  Per hospital notes he is waiting for apartment to be exterminated due to bed bugs and roaches.  Last hospitalization related to back pain.  Unable to reach member for ICM follow up since October 2016 and last remote transmission was 10/26/2015.   He is currently due for defib office check with Dr Lovena Le.  Next ICM remote transmission scheduled for 12/03/2015.  Patient letter sent.

## 2015-11-19 NOTE — Telephone Encounter (Signed)
APPT. REMINDER CALL, LMTCB IF HE NEEDS TO CANCEL °

## 2015-11-20 DIAGNOSIS — I5043 Acute on chronic combined systolic (congestive) and diastolic (congestive) heart failure: Secondary | ICD-10-CM | POA: Diagnosis not present

## 2015-11-20 DIAGNOSIS — E1122 Type 2 diabetes mellitus with diabetic chronic kidney disease: Secondary | ICD-10-CM | POA: Diagnosis not present

## 2015-11-20 DIAGNOSIS — I130 Hypertensive heart and chronic kidney disease with heart failure and stage 1 through stage 4 chronic kidney disease, or unspecified chronic kidney disease: Secondary | ICD-10-CM | POA: Diagnosis not present

## 2015-11-20 DIAGNOSIS — N183 Chronic kidney disease, stage 3 (moderate): Secondary | ICD-10-CM | POA: Diagnosis not present

## 2015-11-20 DIAGNOSIS — R339 Retention of urine, unspecified: Secondary | ICD-10-CM | POA: Diagnosis not present

## 2015-11-20 DIAGNOSIS — I252 Old myocardial infarction: Secondary | ICD-10-CM | POA: Diagnosis not present

## 2015-11-20 DIAGNOSIS — I2510 Atherosclerotic heart disease of native coronary artery without angina pectoris: Secondary | ICD-10-CM | POA: Diagnosis not present

## 2015-11-20 DIAGNOSIS — E1151 Type 2 diabetes mellitus with diabetic peripheral angiopathy without gangrene: Secondary | ICD-10-CM | POA: Diagnosis not present

## 2015-11-20 DIAGNOSIS — I255 Ischemic cardiomyopathy: Secondary | ICD-10-CM | POA: Diagnosis not present

## 2015-11-22 ENCOUNTER — Telehealth: Payer: Self-pay | Admitting: Student in an Organized Health Care Education/Training Program

## 2015-11-22 ENCOUNTER — Encounter: Payer: Self-pay | Admitting: Internal Medicine

## 2015-11-22 ENCOUNTER — Ambulatory Visit (INDEPENDENT_AMBULATORY_CARE_PROVIDER_SITE_OTHER): Payer: Commercial Managed Care - HMO | Admitting: Internal Medicine

## 2015-11-22 ENCOUNTER — Other Ambulatory Visit: Payer: Self-pay | Admitting: *Deleted

## 2015-11-22 VITALS — BP 112/58 | HR 63 | Temp 97.4°F | Resp 18 | Ht 70.0 in | Wt 170.8 lb

## 2015-11-22 DIAGNOSIS — N183 Chronic kidney disease, stage 3 (moderate): Secondary | ICD-10-CM | POA: Diagnosis not present

## 2015-11-22 DIAGNOSIS — R339 Retention of urine, unspecified: Secondary | ICD-10-CM | POA: Diagnosis not present

## 2015-11-22 DIAGNOSIS — R338 Other retention of urine: Secondary | ICD-10-CM

## 2015-11-22 DIAGNOSIS — E1151 Type 2 diabetes mellitus with diabetic peripheral angiopathy without gangrene: Secondary | ICD-10-CM | POA: Diagnosis not present

## 2015-11-22 DIAGNOSIS — I10 Essential (primary) hypertension: Secondary | ICD-10-CM

## 2015-11-22 DIAGNOSIS — I5043 Acute on chronic combined systolic (congestive) and diastolic (congestive) heart failure: Secondary | ICD-10-CM | POA: Diagnosis not present

## 2015-11-22 DIAGNOSIS — I255 Ischemic cardiomyopathy: Secondary | ICD-10-CM | POA: Diagnosis not present

## 2015-11-22 DIAGNOSIS — I110 Hypertensive heart disease with heart failure: Secondary | ICD-10-CM

## 2015-11-22 DIAGNOSIS — I130 Hypertensive heart and chronic kidney disease with heart failure and stage 1 through stage 4 chronic kidney disease, or unspecified chronic kidney disease: Secondary | ICD-10-CM | POA: Diagnosis not present

## 2015-11-22 DIAGNOSIS — Z0000 Encounter for general adult medical examination without abnormal findings: Secondary | ICD-10-CM | POA: Insufficient documentation

## 2015-11-22 DIAGNOSIS — I252 Old myocardial infarction: Secondary | ICD-10-CM | POA: Diagnosis not present

## 2015-11-22 DIAGNOSIS — I5042 Chronic combined systolic (congestive) and diastolic (congestive) heart failure: Secondary | ICD-10-CM | POA: Diagnosis not present

## 2015-11-22 DIAGNOSIS — E1122 Type 2 diabetes mellitus with diabetic chronic kidney disease: Secondary | ICD-10-CM | POA: Diagnosis not present

## 2015-11-22 DIAGNOSIS — I5022 Chronic systolic (congestive) heart failure: Secondary | ICD-10-CM | POA: Diagnosis not present

## 2015-11-22 DIAGNOSIS — Z960 Presence of urogenital implants: Secondary | ICD-10-CM

## 2015-11-22 DIAGNOSIS — I2510 Atherosclerotic heart disease of native coronary artery without angina pectoris: Secondary | ICD-10-CM | POA: Diagnosis not present

## 2015-11-22 LAB — GLUCOSE, CAPILLARY: GLUCOSE-CAPILLARY: 224 mg/dL — AB (ref 65–99)

## 2015-11-22 MED ORDER — FUROSEMIDE 40 MG PO TABS: 40.0000 mg | ORAL_TABLET | Freq: Two times a day (BID) | ORAL | Status: DC

## 2015-11-22 NOTE — Telephone Encounter (Signed)
[name] rtc, pt does not have a glucometer and is not sure hes taking insulin correctly, she plans on working with him on this, would like for dr to send scripts in for diab supplies

## 2015-11-22 NOTE — Assessment & Plan Note (Addendum)
-  Pt declined influenza and shingles vaccinations  -Discuss screening colonoscopy at next visit, overdue for 5-year follow-up in setting of tubular adenoma

## 2015-11-22 NOTE — Assessment & Plan Note (Signed)
Assessment: Pt with moderately well-controlled hypertension compliant with three-class (ACEI, BB, & diuretic) anti-hypertensive therapy who presents with blood pressure of 112/58.   Plan:  -BP 112/58 at goal <140/90 -Pt taking lasix 20 mg daily instead of instructed 40 mg BID on discharge, change lasix 20 mg daily to 40 mg BID, pt reported he will go pick up the prescription today -Continue carvedilol 25 mg BOD and lisinopril 5 mg daily  -Obtain BMP in setting of recent AKI on [name] Stage 3

## 2015-11-22 NOTE — Assessment & Plan Note (Signed)
Assessment: Pt with acute urinary retention during recent hospitalization in setting of aggressive diuresis and complicated coag neg staph UTI compliant with antibiotic therapy who presents with foley catheter with no urinary symptoms.   Plan:  -Pt given appointment for urology who will perform voiding trial and removal of foley catheter  -Continue ciprofloxacin 250 mg BID for 10-day course (to end March 2)

## 2015-11-22 NOTE — Progress Notes (Signed)
Patient ID: [name], male   DOB: 1931/04/06, 82 y.o.   MRN: [id number]   Subjective:   Patient ID: [name] male   DOB: 1931/03/02 82 y.o.   MRN: [id number]  HPI: [name] is a 82 y.o. 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   He was hospitalized form 2/16-2/20 for acute on chronic combined CHF in setting of dietary indiscretion. His 2D-echo revealed unchanged EF 20-25% with moderate aortic stenosis, and chronic moderate pericardial effusion. He received IV diuresis with return to baseline volume and breathing status. His discharge weight was 165 lb. He was instructed to take lasix 40 mg BID on discharge but has brought in his pill bottles and has been actually taking 20 mg daily. He denies dyspnea, orthopnea, or LE edema. He has not been eating salty foods. His hospital course was complicated by AKI on [name] Stage 3 due to increase in ACEi therapy and aggressive IV diuresis. He also had urinary retention requiring foley catheter placement with leg bag and complicated coag neg staph UTI requiring ciprofloxacin for 10 days which he is still taking. He denies dysuria or hematuria. He is to see urology for voiding trial and removal of foley catheter.   He reports compliance with taking lisinopril, carvedilol, and lasix (but 20 mg instead of 40 mg BID) for hypertension. He denies headache, blurry vision, chest pain, or lightheadedness.    He declines flu or shingles vaccinations.     Past Medical History  Diagnosis Date  . Anemia   . Cataracts, bilateral   . Chronic systolic CHF (congestive heart failure) ([place])     a. ischemic CM EF 15-20%;  b. s/p AICD 05/24/04;  c. Echo 7/06: EF  30-40%, mild reduced RVSF  . CAD (coronary artery disease)     a. s/p AMI, s/p PTCA & stent of cx 12/04;  b. LHC (5/05): Proximal LAD 100% with bridging collaterals (CTO), proximal circumflex 20%, mid circumflex 95% ISR, proximal-mid RCA 75%, EF 20% >>PCI: 3.0 x 28 mm Cypher DES to the mid CFX  . Diverticulosis of colon   . Hyperlipidemia   . PVD (peripheral vascular disease) ([place])     s/p L carotid PTCA/stent 2004  . Transient ischemic attack   . Erectile dysfunction   . Hyperkalemia 08/2008    K=5.7   . BBB (bundle branch block)     right  . HTN (hypertension)   . Elevated PSA   . Adenomatous colon polyp 02/14/2012  . ICD (implantable cardiac defibrillator) in place 12-25-2012    MDT CRTD upgrade by Dr Lovena Le  . Pacemaker   . Type II diabetes mellitus ([place])   . Myocardial infarction ([place]) 1990  . Arthritis     "right hip" (12/25/2012)  . Carotid stenosis     a. s/p L carotid stent 2004;  b. Carotid US (09/2014): Bilateral ICA 1-39%, left ECA >59%, normal subclavian bilaterally, occluded left vertebral >> FU 2 years  . Pericardial effusion     Echocardiogram (09/2014): EF 25% with distal anterior, distal inferior, distal lateral and apical akinesis, grade 1 diastolic dysfunction, very mild aortic stenosis (mean 7 mmHg) - this may be depressed due to low EF (2-D images suggest mild to  moderate aortic stenosis), large pericardial effusion, no RA collapse   Current Outpatient Prescriptions  Medication Sig Dispense Refill  . albuterol (VENTOLIN HFA) 108 (90 BASE) MCG/ACT inhaler Inhale 1-2 puffs into the lungs every 4 (four) hours as needed for wheezing or shortness of breath. 1 Inhaler 3  . aspirin EC 81 MG tablet Take 81 mg by mouth daily.    . B-D ULTRAFINE III SHORT PEN 31G X 8 MM MISC USE AS DIRECTED ONCE DAILY FOR INSULIN INJECTION 100 each 0  . carvedilol (COREG) 25 MG tablet TAKE 1 TABLET BY MOUTH TWICE DAILY WITH A MEAL 180 tablet 1  . ciprofloxacin (CIPRO) 250 MG tablet Take 1  tablet (250 mg total) by mouth 2 (two) times daily. 20 tablet 0  . furosemide (LASIX) 40 MG tablet Take 1 tablet (40 mg total) by mouth 2 (two) times daily. 180 tablet 3  . Insulin Glargine (LANTUS SOLOSTAR) 100 UNIT/ML Solostar Pen Inject 30 Units into the skin at bedtime. 15 mL 11  . lisinopril (PRINIVIL,ZESTRIL) 5 MG tablet Take 1 tablet (5 mg total) by mouth daily. 30 tablet 5  . pantoprazole (PROTONIX) 40 MG tablet TAKE 1 TABLET BY MOUTH DAILY 90 tablet 2  . simvastatin (ZOCOR) 40 MG tablet TAKE 1 TABLET BY MOUTH DAILY 90 tablet 3   No current facility-administered medications for this visit.   Family History  Problem Relation Age of Onset  . Diabetes Mother   . Diabetes Brother   . Heart attack Neg Hx   . Stroke Neg Hx    Social History   Social History  . Marital Status: Widowed    Spouse Name: N/A  . Number of Children: N/A  . Years of Education: 12   Occupational History  . retired    Social History Main Topics  . Smoking status: Former Smoker    Types: Cigarettes    Quit date: 12/27/1967  . Smokeless tobacco: Never Used  . Alcohol Use: No     Comment: 12/25/2012 "quit all alcohol 60 yr ago"  . Drug Use: No  . Sexual Activity: Not on file   Other Topics Concern  . Not on file   Social History Narrative   Single, 2 adult children, daughter in [place], son in [place]: Review of Systems  Constitutional: Negative for fever, chills and weight loss.  HENT: Positive for tinnitus (right ear).   Eyes: Negative for blurred vision.  Respiratory: Negative for cough, shortness of breath and wheezing.   Cardiovascular: Negative for chest pain and leg swelling.  Gastrointestinal: Negative for nausea, vomiting, abdominal pain, diarrhea and constipation.  Genitourinary: Negative for dysuria, urgency, frequency and hematuria.       Urinary retention with temporary foley catheter   Musculoskeletal: Positive for back pain (chronic low back).    Neurological: Negative for dizziness and headaches.    Objective:  Physical Exam: Filed Vitals:   11/22/15 1105  BP: 112/58  Pulse: 63  Temp: 97.4 F (36.3 C)  TempSrc: Oral  Resp: 18  Height: 5\' 10"  (1.778 m)  Weight: 170 lb 12.8 oz (77.474 kg)  SpO2: 100%    Physical Exam  Constitutional: He is oriented to person, place, and time. He appears well-developed and well-nourished. No distress.  HENT:  Head: Normocephalic and atraumatic.  Right Ear: External ear normal.  Left Ear: External ear normal.  Nose: Nose normal.  Mouth/Throat: Oropharynx is clear and moist. No oropharyngeal exudate.  Right ear  with no wax buildup and normal tympanic membrane   Eyes: Conjunctivae and EOM are normal. Pupils are equal, round, and reactive to light. Right eye exhibits no discharge. Left eye exhibits no discharge. No scleral icterus.  Neck: Normal range of motion. Neck supple.  Cardiovascular: Normal rate and regular rhythm.   Murmur heard. Pulmonary/Chest: Effort normal and breath sounds normal. No respiratory distress. He has no wheezes. He has no rales.  Abdominal: Soft. Bowel sounds are normal. He exhibits no distension. There is no tenderness. There is no rebound and no guarding.  Musculoskeletal: Normal range of motion. He exhibits no edema or tenderness.  Neurological: He is alert and oriented to person, place, and time.  Skin: Skin is warm and dry. No rash noted. He is not diaphoretic. No erythema. No pallor.  Psychiatric: He has a normal mood and affect. His behavior is normal. Judgment and thought content normal.    Assessment & Plan:   Please see problem list for problem-based assessment and plan

## 2015-11-22 NOTE — Telephone Encounter (Signed)
Attempted to call trisha back, got vmail, lm for rtc

## 2015-11-22 NOTE — Assessment & Plan Note (Signed)
Assessment: Pt with chronic combined CHF with last 2D-echo on 11/11/15 with EF 20-25% and moderate AS with recent hospitalization for exacerbation in setting of dietary indiscretion partially compliant with medical therapy who presents with no exacerbation.   Plan:  -SpO2 100% on RA -Wt 170 (5 lb up from discharge, however down from 177 lb at last visit in August in clinic) -Pt taking lasix 20 mg daily instead of instructed 40 mg BID on discharge, change lasix 20 mg daily to 40 mg BID, pt reported he will go pick up the prescription today -Continue carvedilol 25 mg BOD and lisinopril 5 mg daily  -Pt instructed to follow low salt diet and to weigh himself daily at home

## 2015-11-22 NOTE — Patient Instructions (Signed)
-  Take lasix 40 mg twice a day, I sent in a new prescription to the pharmacy -Make sure you take all of the ciprofloxacin for your urine infection  -Will give you the appointment to follow-up with urology so they can take out your catheter  -Take OTC tylenol 500 mg every 4 hrs as needed for back pain  -Will check your bloodwork and call you with the results   General Instructions:   Thank you for bringing your medicines today. This helps [place] keep you safe from mistakes.   Progress Toward Treatment Goals:  Treatment Goal 12/03/2014  Hemoglobin A1C improved  Blood pressure at goal    Self Care Goals & Plans:  Self Care Goal 11/22/2015  Manage my medications take my medicines as prescribed; bring my medications to every visit  Monitor my health keep track of my weight  Eat healthy foods eat foods that are low in salt  Be physically active take a walk every day    Home Blood Glucose Monitoring 12/03/2014  Check my blood sugar 2 times a day  When to check my blood sugar before meals     Care Management & Community Referrals:  Referral 12/03/2014  Referrals made for care management support none needed  Referrals made to community resources none

## 2015-11-22 NOTE — Telephone Encounter (Signed)
Rec'd call from [facility].  Patient seen today by her at his home and the home has been sprayed and cleaned.  Patient does not have a Glucometer and is taking insulin and is not testing and states he thinks he is giving himself 30 units.  Larena Glassman is concerned about the patients Insulin usage and would like a call back.

## 2015-11-23 ENCOUNTER — Telehealth: Payer: Self-pay | Admitting: *Deleted

## 2015-11-23 ENCOUNTER — Telehealth: Payer: Self-pay | Admitting: Student in an Organized Health Care Education/Training Program

## 2015-11-23 ENCOUNTER — Other Ambulatory Visit: Payer: Self-pay | Admitting: Student in an Organized Health Care Education/Training Program

## 2015-11-23 LAB — BMP8+ANION GAP
Anion Gap: 18 mmol/L (ref 10.0–18.0)
BUN / CREAT RATIO: 18 (ref 10–22)
BUN: 36 mg/dL — ABNORMAL HIGH (ref 8–27)
CO2: 21 mmol/L (ref 18–29)
CREATININE: 2.03 mg/dL — AB (ref 0.76–1.27)
Calcium: 9.1 mg/dL (ref 8.6–10.2)
Chloride: 101 mmol/L (ref 96–106)
GFR calc Af Amer: 34 mL/min/{1.73_m2} — ABNORMAL LOW (ref >59)
GFR calc non Af Amer: 30 mL/min/{1.73_m2} — ABNORMAL LOW (ref >59)
Glucose: 228 mg/dL — ABNORMAL HIGH (ref 65–99)
Potassium: 5.4 mmol/L — ABNORMAL HIGH (ref 3.5–5.2)
Sodium: 140 mmol/L (ref 134–144)

## 2015-11-23 MED ORDER — ACCU-CHEK MULTICLIX LANCETS MISC: Status: DC

## 2015-11-23 MED ORDER — ACCU-CHEK AVIVA PLUS W/DEVICE KIT: PACK | Status: DC

## 2015-11-23 MED ORDER — ACCU-CHEK FASTCLIX LANCET KIT: PACK | Status: DC

## 2015-11-23 MED ORDER — GLUCOSE BLOOD VI STRP: ORAL_STRIP | Status: DC

## 2015-11-23 NOTE — Telephone Encounter (Signed)
Calling to speak with nurse about medications. Concerned that patient is not taking Lisinopril. States that pt acted like he didn't know anything about this medication.

## 2015-11-23 NOTE — Telephone Encounter (Signed)
Yes. Those services are appropriate for Mr. Swartout. Please place a verbal order for me.   Thanks, Damita Dunnings

## 2015-11-23 NOTE — Telephone Encounter (Signed)
Spoke w/ crystal at [facility], informed that [facility] is seeing pt several times a week and monitoring his medications, that quite possibly he was a little confused but would let Bo[place] know

## 2015-11-23 NOTE — Progress Notes (Signed)
Internal Medicine Clinic Attending  Case discussed with Dr. Rabbani soon after the resident saw the patient.  We reviewed the resident's history and exam and pertinent patient test results.  I agree with the assessment, diagnosis, and plan of care documented in the resident's note.  

## 2015-11-23 NOTE — Telephone Encounter (Signed)
Adm HHN for [facility] finds these needs: 1)  PCS aide 2) csw HH 3) OT eval and tx plan Verbal approval given, do you agree?

## 2015-11-23 NOTE — Telephone Encounter (Signed)
Called, got vmail, lm for rtc 

## 2015-11-24 ENCOUNTER — Other Ambulatory Visit: Payer: Self-pay | Admitting: Student in an Organized Health Care Education/Training Program

## 2015-11-24 DIAGNOSIS — R339 Retention of urine, unspecified: Secondary | ICD-10-CM | POA: Diagnosis not present

## 2015-11-24 DIAGNOSIS — I130 Hypertensive heart and chronic kidney disease with heart failure and stage 1 through stage 4 chronic kidney disease, or unspecified chronic kidney disease: Secondary | ICD-10-CM | POA: Diagnosis not present

## 2015-11-24 DIAGNOSIS — N183 Chronic kidney disease, stage 3 (moderate): Secondary | ICD-10-CM | POA: Diagnosis not present

## 2015-11-24 DIAGNOSIS — E1151 Type 2 diabetes mellitus with diabetic peripheral angiopathy without gangrene: Secondary | ICD-10-CM | POA: Diagnosis not present

## 2015-11-24 DIAGNOSIS — I2510 Atherosclerotic heart disease of native coronary artery without angina pectoris: Secondary | ICD-10-CM | POA: Diagnosis not present

## 2015-11-24 DIAGNOSIS — I5043 Acute on chronic combined systolic (congestive) and diastolic (congestive) heart failure: Secondary | ICD-10-CM | POA: Diagnosis not present

## 2015-11-24 DIAGNOSIS — E1122 Type 2 diabetes mellitus with diabetic chronic kidney disease: Secondary | ICD-10-CM | POA: Diagnosis not present

## 2015-11-24 DIAGNOSIS — I252 Old myocardial infarction: Secondary | ICD-10-CM | POA: Diagnosis not present

## 2015-11-24 DIAGNOSIS — I255 Ischemic cardiomyopathy: Secondary | ICD-10-CM | POA: Diagnosis not present

## 2015-11-25 ENCOUNTER — Telehealth: Payer: Self-pay | Admitting: Student in an Organized Health Care Education/Training Program

## 2015-11-25 DIAGNOSIS — I255 Ischemic cardiomyopathy: Secondary | ICD-10-CM | POA: Diagnosis not present

## 2015-11-25 DIAGNOSIS — N183 Chronic kidney disease, stage 3 (moderate): Secondary | ICD-10-CM | POA: Diagnosis not present

## 2015-11-25 DIAGNOSIS — I5043 Acute on chronic combined systolic (congestive) and diastolic (congestive) heart failure: Secondary | ICD-10-CM | POA: Diagnosis not present

## 2015-11-25 DIAGNOSIS — I2510 Atherosclerotic heart disease of native coronary artery without angina pectoris: Secondary | ICD-10-CM | POA: Diagnosis not present

## 2015-11-25 DIAGNOSIS — E1122 Type 2 diabetes mellitus with diabetic chronic kidney disease: Secondary | ICD-10-CM | POA: Diagnosis not present

## 2015-11-25 DIAGNOSIS — R339 Retention of urine, unspecified: Secondary | ICD-10-CM | POA: Diagnosis not present

## 2015-11-25 DIAGNOSIS — E1151 Type 2 diabetes mellitus with diabetic peripheral angiopathy without gangrene: Secondary | ICD-10-CM | POA: Diagnosis not present

## 2015-11-25 DIAGNOSIS — I130 Hypertensive heart and chronic kidney disease with heart failure and stage 1 through stage 4 chronic kidney disease, or unspecified chronic kidney disease: Secondary | ICD-10-CM | POA: Diagnosis not present

## 2015-11-25 DIAGNOSIS — I252 Old myocardial infarction: Secondary | ICD-10-CM | POA: Diagnosis not present

## 2015-11-25 NOTE — Telephone Encounter (Signed)
Spoke w/ crystal [facility] nurse,  will call [facility] tomorrow and order daily weights on pt-r/t dr rabbani's visit note of recent. Will also when i speak w/ HHN have them confirm pt is taking pills from pillbox, especially the lisinopril, also will confirm that [facility] meter has been picked up along w/ supplies

## 2015-11-25 NOTE — Telephone Encounter (Signed)
Crystal from [facility] requesting the nurse to call back.

## 2015-11-26 NOTE — Telephone Encounter (Signed)
[name] [facility] will call crystal [facility] and they will co-ordinate care services

## 2015-11-29 ENCOUNTER — Other Ambulatory Visit: Payer: Self-pay

## 2015-11-29 NOTE — Patient Outreach (Signed)
[facility] [facility]) Care Management  11/29/2015  [name] 02-25-1931 [id number]   [name] RED ON EMMI ALERT Day #2 Date:  11/27/15 Red Alert Reason: "new/worsening problems? Yes"   Outreach attempt #1 to patient. No answer at present. RN CM left HIPAA compliant voicemail message along with contact info.    Plan: RN CM will make outreach call to patient within 24 hrs.    [name], RN,BSN,CCM [place] Management Telephonic Care Management Coordinator Direct Phone: [phone] Toll Free: [phone] Fax: [phone]

## 2015-11-30 ENCOUNTER — Other Ambulatory Visit: Payer: Self-pay

## 2015-11-30 NOTE — Patient Outreach (Signed)
[place] [facility]) Care Management  11/30/2015  KENDON BOOZER December 21, 1930 [id number]   [name] RED ON EMMI ALERT Day #2 Date: 11/27/15 Red Alert Reason: "new/worsening problems? Yes"   Outreach attempt #2 to patient. No answer and voicemail box full.   Plan: RN CM will make outreach attempt to patient within  24 hrs.  [name], RN,BSN,CCM [place] Management Telephonic Care Management Coordinator Direct Phone: [phone] Toll Free: [phone] Fax: [phone]

## 2015-12-01 ENCOUNTER — Other Ambulatory Visit: Payer: Self-pay

## 2015-12-01 DIAGNOSIS — E1122 Type 2 diabetes mellitus with diabetic chronic kidney disease: Secondary | ICD-10-CM | POA: Diagnosis not present

## 2015-12-01 DIAGNOSIS — I2510 Atherosclerotic heart disease of native coronary artery without angina pectoris: Secondary | ICD-10-CM | POA: Diagnosis not present

## 2015-12-01 DIAGNOSIS — I255 Ischemic cardiomyopathy: Secondary | ICD-10-CM | POA: Diagnosis not present

## 2015-12-01 DIAGNOSIS — I252 Old myocardial infarction: Secondary | ICD-10-CM | POA: Diagnosis not present

## 2015-12-01 DIAGNOSIS — N183 Chronic kidney disease, stage 3 (moderate): Secondary | ICD-10-CM | POA: Diagnosis not present

## 2015-12-01 DIAGNOSIS — I5043 Acute on chronic combined systolic (congestive) and diastolic (congestive) heart failure: Secondary | ICD-10-CM | POA: Diagnosis not present

## 2015-12-01 DIAGNOSIS — I130 Hypertensive heart and chronic kidney disease with heart failure and stage 1 through stage 4 chronic kidney disease, or unspecified chronic kidney disease: Secondary | ICD-10-CM | POA: Diagnosis not present

## 2015-12-01 DIAGNOSIS — E1151 Type 2 diabetes mellitus with diabetic peripheral angiopathy without gangrene: Secondary | ICD-10-CM | POA: Diagnosis not present

## 2015-12-01 DIAGNOSIS — R339 Retention of urine, unspecified: Secondary | ICD-10-CM | POA: Diagnosis not present

## 2015-12-01 NOTE — Patient Outreach (Signed)
[place] [facility]) Care Management  12/01/2015  JOHNLEE MATSUYAMA 12/04/1930 [id number]  [name] RED ON EMMI ALERT Day #2 Date: 11/27/15 Red Alert Reason: "new/worsening problems? Yes"   Outreach attempt #3 to patient to follow up on red alert. Spoke with patient. He denies having any new issues or concerns. He states that he misunderstood automated call and did not mean to answer yes to question. He reports that his sister and brother are helping him out and assisting with his care. He has an appt on Friday and his brother will be taking him there. He states that he is managing his meds independently and denies any issues with affording meds or adhering to meds. Patient reported that his only need/concern at present was how to contact Medicaid office to inquire about in home aide to assist with light housekeeping. Provided info to patient. He also requested that RN CM contact his sister(Beverly williams) to provide her with the information as well. Call placed to patient's sister and advised her of in home aide program offered through [facility] and contact info to follow up with department to see if patient eligible for program. She voiced understanding.   Plan: RN CM will notify [facility] administrative assistant of case status.   [name], RN,BSN,CCM [place] Management Telephonic Care Management Coordinator Direct Phone: [phone] Toll Free: [phone] Fax: [phone]

## 2015-12-03 ENCOUNTER — Ambulatory Visit (INDEPENDENT_AMBULATORY_CARE_PROVIDER_SITE_OTHER): Payer: Commercial Managed Care - HMO

## 2015-12-03 DIAGNOSIS — I5022 Chronic systolic (congestive) heart failure: Secondary | ICD-10-CM

## 2015-12-03 DIAGNOSIS — Z95810 Presence of automatic (implantable) cardiac defibrillator: Secondary | ICD-10-CM

## 2015-12-06 ENCOUNTER — Telehealth: Payer: Self-pay | Admitting: Student in an Organized Health Care Education/Training Program

## 2015-12-06 ENCOUNTER — Other Ambulatory Visit: Payer: Self-pay

## 2015-12-06 NOTE — Telephone Encounter (Signed)
Spoke with patient, he reports noticing blood at the tip on his penis and around catheter when he woke up this morning.  Not reporting frank blood in the bag, states urine is "pretty clear."  No swelling or redness and no additional bleeding at this time.  Pt is aware of his urology appointment on Wednesday at 10am, I advised catheter may have pulled during his sleep and caused irritation to his urethra.  Patient to keep an eye on his output today and knows to call me back if he notices any significant changes to color, frank red blood, or decreased output.

## 2015-12-06 NOTE — Telephone Encounter (Signed)
Pt has a question about states that he is bleeding where his catheter is and has blood in urine.

## 2015-12-06 NOTE — Patient Outreach (Signed)
[place] [facility]) Care Management  12/06/2015  [name] October 07, 1930 [id number]   Care Coordination   Incoming call received from [name] at [facility]. She reports that patient needs a replacement scale and has been reporting he would have family assist with getting one but has not done so yet. Staff inquiring if [facility] would provide scale to patient. Advised that patient was a "one and done" case in which case was opened and closed the same day. Patient getting [facility] services as well as [employer] at home program and [facility] program with [facility]. Advised that RN CM would consult with management on rather or not to provide scale since patient not currently active with program at this time. RN CM consulted with director. Alternative options explored. Return call placed to [employer] and voicemail message update provided. Advised to consider HF telemonitoring program with [facility] if patient eligible and/or informed her that patient has [place] program which entitles him to OTC benefit catolog in which he could use to order/purchase at no cost or for very minimal cost to patient.   [name], RN,BSN,CCM [place] Management Telephonic Care Management Coordinator Direct Phone: [phone] Toll Free: [phone] Fax: [phone]

## 2015-12-08 ENCOUNTER — Other Ambulatory Visit: Payer: Self-pay

## 2015-12-08 DIAGNOSIS — N401 Enlarged prostate with lower urinary tract symptoms: Secondary | ICD-10-CM | POA: Diagnosis not present

## 2015-12-08 DIAGNOSIS — R338 Other retention of urine: Secondary | ICD-10-CM | POA: Diagnosis not present

## 2015-12-08 DIAGNOSIS — R3914 Feeling of incomplete bladder emptying: Secondary | ICD-10-CM | POA: Diagnosis not present

## 2015-12-08 DIAGNOSIS — R972 Elevated prostate specific antigen [PSA]: Secondary | ICD-10-CM | POA: Diagnosis not present

## 2015-12-08 NOTE — Patient Outreach (Signed)
[place] [facility]) Care Management  12/08/2015  LATHYN PICCIOTTO [birth year] [id number]    [name] RED ON EMMI ALERT Day #12 Date: 12/07/15 Red Alert Reason: "New/worsening problems? Yes"   Outreach attempt #1 to patient. No answer. RN CM left HIPAA compliant voicemail message along with contact info.   Plan: RN CM will make outreach attempt to patient within 24hrs.  [name], RN,BSN,CCM [place] Management Telephonic Care Management Coordinator Direct Phone: [phone] Toll Free: [phone] Fax: [phone]

## 2015-12-08 NOTE — Progress Notes (Signed)
EPIC Encounter for ICM Monitoring  Patient Name: [name] is a 82 y.o. male Date: 12/08/2015 Primary Care Physican: Axel Filler, MD Primary Cardiologist: [place] Electrophysiologist: Lovena Le Dry Weight: unknown  Bi-V Pacing 100%      In the past month, have you:  1. Gained more than 2 pounds in a day or more than 5 pounds in a week? N/A  2. Had changes in your medications (with verification of current medications)? N/A  3. Had more shortness of breath than is usual for you? N/A  4. Limited your activity because of shortness of breath? N/A  5. Not been able to sleep because of shortness of breath? N/A  6. Had increased swelling in your feet or ankles? N/A  7. Had symptoms of dehydration (dizziness, dry mouth, increased thirst, decreased urine output) N/A  8. Had changes in sodium restriction? N/A  9. Been compliant with medication? N/A   ICM trend: 3 month view for 12/06/2015   ICM trend: 1 year view for 12/06/2015   Follow-up plan: ICM clinic phone appointment 01/11/2016.   Attempted call to patient and unable to reach.  Transmission reviewed.  Thoracic impedance below reference line from 09/28/2015 to 11/25/2015 suggesting fluid accumulation which correlates with 2 hospitalizations in February.  Patient is due for in office check with Dr Lovena Le.  [name], RN, CCM 12/08/2015 10:52 AM

## 2015-12-09 ENCOUNTER — Other Ambulatory Visit: Payer: Self-pay

## 2015-12-09 ENCOUNTER — Other Ambulatory Visit: Payer: Self-pay | Admitting: Internal Medicine

## 2015-12-09 DIAGNOSIS — I5043 Acute on chronic combined systolic (congestive) and diastolic (congestive) heart failure: Secondary | ICD-10-CM | POA: Diagnosis not present

## 2015-12-09 DIAGNOSIS — I252 Old myocardial infarction: Secondary | ICD-10-CM | POA: Diagnosis not present

## 2015-12-09 DIAGNOSIS — I130 Hypertensive heart and chronic kidney disease with heart failure and stage 1 through stage 4 chronic kidney disease, or unspecified chronic kidney disease: Secondary | ICD-10-CM | POA: Diagnosis not present

## 2015-12-09 DIAGNOSIS — N183 Chronic kidney disease, stage 3 (moderate): Secondary | ICD-10-CM | POA: Diagnosis not present

## 2015-12-09 DIAGNOSIS — E1151 Type 2 diabetes mellitus with diabetic peripheral angiopathy without gangrene: Secondary | ICD-10-CM | POA: Diagnosis not present

## 2015-12-09 DIAGNOSIS — E1122 Type 2 diabetes mellitus with diabetic chronic kidney disease: Secondary | ICD-10-CM | POA: Diagnosis not present

## 2015-12-09 DIAGNOSIS — R339 Retention of urine, unspecified: Secondary | ICD-10-CM | POA: Diagnosis not present

## 2015-12-09 DIAGNOSIS — I2510 Atherosclerotic heart disease of native coronary artery without angina pectoris: Secondary | ICD-10-CM | POA: Diagnosis not present

## 2015-12-09 DIAGNOSIS — I255 Ischemic cardiomyopathy: Secondary | ICD-10-CM | POA: Diagnosis not present

## 2015-12-09 NOTE — Patient Outreach (Signed)
[place] [facility]) Care Management  12/09/2015  [name] [birth year] [id number]   [name] RED ON EMMI ALERT Day #12 Date: 12/07/15 Red Alert Reason: "New/worsening problems? Yes"   Outreach attempt #2 to patient. No answer. RN CM left HIPAA compliant voicemail message along with contact info.   Plan: RN CM will send unsuccessful outreach letter to patient. RN CM will close case if no response from patient within 10 business days.  [name], RN,BSN,CCM [place] Management Telephonic Care Management Coordinator Direct Phone: [phone] Toll Free: [phone] Fax: [phone]

## 2015-12-16 DIAGNOSIS — E1151 Type 2 diabetes mellitus with diabetic peripheral angiopathy without gangrene: Secondary | ICD-10-CM | POA: Diagnosis not present

## 2015-12-16 DIAGNOSIS — R339 Retention of urine, unspecified: Secondary | ICD-10-CM | POA: Diagnosis not present

## 2015-12-16 DIAGNOSIS — E1122 Type 2 diabetes mellitus with diabetic chronic kidney disease: Secondary | ICD-10-CM | POA: Diagnosis not present

## 2015-12-16 DIAGNOSIS — N183 Chronic kidney disease, stage 3 (moderate): Secondary | ICD-10-CM | POA: Diagnosis not present

## 2015-12-16 DIAGNOSIS — I130 Hypertensive heart and chronic kidney disease with heart failure and stage 1 through stage 4 chronic kidney disease, or unspecified chronic kidney disease: Secondary | ICD-10-CM | POA: Diagnosis not present

## 2015-12-16 DIAGNOSIS — I252 Old myocardial infarction: Secondary | ICD-10-CM | POA: Diagnosis not present

## 2015-12-16 DIAGNOSIS — I2510 Atherosclerotic heart disease of native coronary artery without angina pectoris: Secondary | ICD-10-CM | POA: Diagnosis not present

## 2015-12-16 DIAGNOSIS — I5043 Acute on chronic combined systolic (congestive) and diastolic (congestive) heart failure: Secondary | ICD-10-CM | POA: Diagnosis not present

## 2015-12-16 DIAGNOSIS — I255 Ischemic cardiomyopathy: Secondary | ICD-10-CM | POA: Diagnosis not present

## 2015-12-22 DIAGNOSIS — I5043 Acute on chronic combined systolic (congestive) and diastolic (congestive) heart failure: Secondary | ICD-10-CM | POA: Diagnosis not present

## 2015-12-22 DIAGNOSIS — E1151 Type 2 diabetes mellitus with diabetic peripheral angiopathy without gangrene: Secondary | ICD-10-CM | POA: Diagnosis not present

## 2015-12-22 DIAGNOSIS — R339 Retention of urine, unspecified: Secondary | ICD-10-CM | POA: Diagnosis not present

## 2015-12-22 DIAGNOSIS — N183 Chronic kidney disease, stage 3 (moderate): Secondary | ICD-10-CM | POA: Diagnosis not present

## 2015-12-22 DIAGNOSIS — R338 Other retention of urine: Secondary | ICD-10-CM | POA: Diagnosis not present

## 2015-12-22 DIAGNOSIS — R3915 Urgency of urination: Secondary | ICD-10-CM | POA: Diagnosis not present

## 2015-12-22 DIAGNOSIS — N401 Enlarged prostate with lower urinary tract symptoms: Secondary | ICD-10-CM | POA: Diagnosis not present

## 2015-12-22 DIAGNOSIS — I252 Old myocardial infarction: Secondary | ICD-10-CM | POA: Diagnosis not present

## 2015-12-22 DIAGNOSIS — I130 Hypertensive heart and chronic kidney disease with heart failure and stage 1 through stage 4 chronic kidney disease, or unspecified chronic kidney disease: Secondary | ICD-10-CM | POA: Diagnosis not present

## 2015-12-22 DIAGNOSIS — R3914 Feeling of incomplete bladder emptying: Secondary | ICD-10-CM | POA: Diagnosis not present

## 2015-12-22 DIAGNOSIS — I255 Ischemic cardiomyopathy: Secondary | ICD-10-CM | POA: Diagnosis not present

## 2015-12-22 DIAGNOSIS — I2510 Atherosclerotic heart disease of native coronary artery without angina pectoris: Secondary | ICD-10-CM | POA: Diagnosis not present

## 2015-12-22 DIAGNOSIS — E1122 Type 2 diabetes mellitus with diabetic chronic kidney disease: Secondary | ICD-10-CM | POA: Diagnosis not present

## 2015-12-22 DIAGNOSIS — R972 Elevated prostate specific antigen [PSA]: Secondary | ICD-10-CM | POA: Diagnosis not present

## 2015-12-24 ENCOUNTER — Other Ambulatory Visit: Payer: Self-pay

## 2015-12-24 NOTE — Patient Outreach (Signed)
[facility]) Care Management  12/24/2015  KHAREE HONOR 12/08/1930 [id number]     [name] RED ON EMMI ALERT Day #12 Date: 12/07/15 Red Alert Reason: "New/worsening problems? Yes"    Multiple attempts to establish contact with patient. No response from letter mailed to patient.   Plan: RN CM will notify [facility] administrative assistant of case closure.   [name], RN,BSN,CCM [facility] Telephonic Care Management Coordinator Direct Phone: [phone] Toll Free: [phone] Fax: [phone]

## 2015-12-27 ENCOUNTER — Telehealth: Payer: Self-pay | Admitting: *Deleted

## 2015-12-27 NOTE — Telephone Encounter (Signed)
[facility] calls for VO for 1 extra visit this week, VO given do you agree?

## 2015-12-27 NOTE — Telephone Encounter (Signed)
Yes, I agree. Thank you!

## 2015-12-29 DIAGNOSIS — I255 Ischemic cardiomyopathy: Secondary | ICD-10-CM | POA: Diagnosis not present

## 2015-12-29 DIAGNOSIS — I252 Old myocardial infarction: Secondary | ICD-10-CM | POA: Diagnosis not present

## 2015-12-29 DIAGNOSIS — I2510 Atherosclerotic heart disease of native coronary artery without angina pectoris: Secondary | ICD-10-CM | POA: Diagnosis not present

## 2015-12-29 DIAGNOSIS — E1151 Type 2 diabetes mellitus with diabetic peripheral angiopathy without gangrene: Secondary | ICD-10-CM | POA: Diagnosis not present

## 2015-12-29 DIAGNOSIS — N183 Chronic kidney disease, stage 3 (moderate): Secondary | ICD-10-CM | POA: Diagnosis not present

## 2015-12-29 DIAGNOSIS — E1122 Type 2 diabetes mellitus with diabetic chronic kidney disease: Secondary | ICD-10-CM | POA: Diagnosis not present

## 2015-12-29 DIAGNOSIS — R339 Retention of urine, unspecified: Secondary | ICD-10-CM | POA: Diagnosis not present

## 2015-12-29 DIAGNOSIS — I130 Hypertensive heart and chronic kidney disease with heart failure and stage 1 through stage 4 chronic kidney disease, or unspecified chronic kidney disease: Secondary | ICD-10-CM | POA: Diagnosis not present

## 2015-12-29 DIAGNOSIS — I5043 Acute on chronic combined systolic (congestive) and diastolic (congestive) heart failure: Secondary | ICD-10-CM | POA: Diagnosis not present

## 2016-01-06 DIAGNOSIS — I255 Ischemic cardiomyopathy: Secondary | ICD-10-CM | POA: Diagnosis not present

## 2016-01-06 DIAGNOSIS — E1122 Type 2 diabetes mellitus with diabetic chronic kidney disease: Secondary | ICD-10-CM | POA: Diagnosis not present

## 2016-01-06 DIAGNOSIS — R339 Retention of urine, unspecified: Secondary | ICD-10-CM | POA: Diagnosis not present

## 2016-01-06 DIAGNOSIS — I5043 Acute on chronic combined systolic (congestive) and diastolic (congestive) heart failure: Secondary | ICD-10-CM | POA: Diagnosis not present

## 2016-01-06 DIAGNOSIS — I2510 Atherosclerotic heart disease of native coronary artery without angina pectoris: Secondary | ICD-10-CM | POA: Diagnosis not present

## 2016-01-06 DIAGNOSIS — E1151 Type 2 diabetes mellitus with diabetic peripheral angiopathy without gangrene: Secondary | ICD-10-CM | POA: Diagnosis not present

## 2016-01-06 DIAGNOSIS — N183 Chronic kidney disease, stage 3 (moderate): Secondary | ICD-10-CM | POA: Diagnosis not present

## 2016-01-06 DIAGNOSIS — I130 Hypertensive heart and chronic kidney disease with heart failure and stage 1 through stage 4 chronic kidney disease, or unspecified chronic kidney disease: Secondary | ICD-10-CM | POA: Diagnosis not present

## 2016-01-06 DIAGNOSIS — I252 Old myocardial infarction: Secondary | ICD-10-CM | POA: Diagnosis not present

## 2016-01-07 ENCOUNTER — Inpatient Hospital Stay (HOSPITAL_COMMUNITY): Payer: Commercial Managed Care - HMO

## 2016-01-07 ENCOUNTER — Encounter (HOSPITAL_COMMUNITY): Payer: Self-pay | Admitting: Internal Medicine

## 2016-01-07 ENCOUNTER — Emergency Department (HOSPITAL_COMMUNITY): Payer: Commercial Managed Care - HMO

## 2016-01-07 ENCOUNTER — Inpatient Hospital Stay (HOSPITAL_COMMUNITY)
Admission: EM | Admit: 2016-01-07 | Discharge: 2016-01-14 | DRG: 286 | Disposition: A | Discharge status: Home Health | Payer: Commercial Managed Care - HMO | Attending: Internal Medicine | Admitting: Internal Medicine

## 2016-01-07 DIAGNOSIS — E875 Hyperkalemia: Secondary | ICD-10-CM | POA: Diagnosis not present

## 2016-01-07 DIAGNOSIS — R579 Shock, unspecified: Secondary | ICD-10-CM | POA: Diagnosis present

## 2016-01-07 DIAGNOSIS — I4901 Ventricular fibrillation: Principal | ICD-10-CM | POA: Diagnosis present

## 2016-01-07 DIAGNOSIS — K5900 Constipation, unspecified: Secondary | ICD-10-CM | POA: Diagnosis not present

## 2016-01-07 DIAGNOSIS — E872 Acidosis: Secondary | ICD-10-CM | POA: Diagnosis not present

## 2016-01-07 DIAGNOSIS — Z4659 Encounter for fitting and adjustment of other gastrointestinal appliance and device: Secondary | ICD-10-CM

## 2016-01-07 DIAGNOSIS — E1151 Type 2 diabetes mellitus with diabetic peripheral angiopathy without gangrene: Secondary | ICD-10-CM | POA: Diagnosis present

## 2016-01-07 DIAGNOSIS — R54 Age-related physical debility: Secondary | ICD-10-CM | POA: Diagnosis present

## 2016-01-07 DIAGNOSIS — Z95810 Presence of automatic (implantable) cardiac defibrillator: Secondary | ICD-10-CM

## 2016-01-07 DIAGNOSIS — R300 Dysuria: Secondary | ICD-10-CM

## 2016-01-07 DIAGNOSIS — I255 Ischemic cardiomyopathy: Secondary | ICD-10-CM

## 2016-01-07 DIAGNOSIS — I313 Pericardial effusion (noninflammatory): Secondary | ICD-10-CM | POA: Diagnosis present

## 2016-01-07 DIAGNOSIS — I469 Cardiac arrest, cause unspecified: Secondary | ICD-10-CM | POA: Diagnosis present

## 2016-01-07 DIAGNOSIS — Z833 Family history of diabetes mellitus: Secondary | ICD-10-CM

## 2016-01-07 DIAGNOSIS — Z955 Presence of coronary angioplasty implant and graft: Secondary | ICD-10-CM

## 2016-01-07 DIAGNOSIS — J9811 Atelectasis: Secondary | ICD-10-CM | POA: Diagnosis present

## 2016-01-07 DIAGNOSIS — E1165 Type 2 diabetes mellitus with hyperglycemia: Secondary | ICD-10-CM | POA: Diagnosis present

## 2016-01-07 DIAGNOSIS — E785 Hyperlipidemia, unspecified: Secondary | ICD-10-CM | POA: Diagnosis present

## 2016-01-07 DIAGNOSIS — N184 Chronic kidney disease, stage 4 (severe): Secondary | ICD-10-CM | POA: Diagnosis present

## 2016-01-07 DIAGNOSIS — I5023 Acute on chronic systolic (congestive) heart failure: Secondary | ICD-10-CM | POA: Diagnosis not present

## 2016-01-07 DIAGNOSIS — D649 Anemia, unspecified: Secondary | ICD-10-CM | POA: Diagnosis present

## 2016-01-07 DIAGNOSIS — I130 Hypertensive heart and chronic kidney disease with heart failure and stage 1 through stage 4 chronic kidney disease, or unspecified chronic kidney disease: Secondary | ICD-10-CM | POA: Diagnosis present

## 2016-01-07 DIAGNOSIS — I5042 Chronic combined systolic (congestive) and diastolic (congestive) heart failure: Secondary | ICD-10-CM | POA: Diagnosis not present

## 2016-01-07 DIAGNOSIS — I462 Cardiac arrest due to underlying cardiac condition: Secondary | ICD-10-CM | POA: Diagnosis present

## 2016-01-07 DIAGNOSIS — G9340 Encephalopathy, unspecified: Secondary | ICD-10-CM | POA: Diagnosis present

## 2016-01-07 DIAGNOSIS — J9600 Acute respiratory failure, unspecified whether with hypoxia or hypercapnia: Secondary | ICD-10-CM

## 2016-01-07 DIAGNOSIS — I509 Heart failure, unspecified: Secondary | ICD-10-CM

## 2016-01-07 DIAGNOSIS — Z87891 Personal history of nicotine dependence: Secondary | ICD-10-CM

## 2016-01-07 DIAGNOSIS — Z8673 Personal history of transient ischemic attack (TIA), and cerebral infarction without residual deficits: Secondary | ICD-10-CM

## 2016-01-07 DIAGNOSIS — E119 Type 2 diabetes mellitus without complications: Secondary | ICD-10-CM

## 2016-01-07 DIAGNOSIS — J9601 Acute respiratory failure with hypoxia: Secondary | ICD-10-CM | POA: Diagnosis present

## 2016-01-07 DIAGNOSIS — I252 Old myocardial infarction: Secondary | ICD-10-CM

## 2016-01-07 DIAGNOSIS — I2510 Atherosclerotic heart disease of native coronary artery without angina pectoris: Secondary | ICD-10-CM | POA: Diagnosis not present

## 2016-01-07 DIAGNOSIS — N179 Acute kidney failure, unspecified: Secondary | ICD-10-CM | POA: Insufficient documentation

## 2016-01-07 DIAGNOSIS — Z794 Long term (current) use of insulin: Secondary | ICD-10-CM

## 2016-01-07 DIAGNOSIS — E11649 Type 2 diabetes mellitus with hypoglycemia without coma: Secondary | ICD-10-CM | POA: Diagnosis not present

## 2016-01-07 DIAGNOSIS — R930 Abnormal findings on diagnostic imaging of skull and head, not elsewhere classified: Secondary | ICD-10-CM | POA: Diagnosis not present

## 2016-01-07 DIAGNOSIS — I959 Hypotension, unspecified: Secondary | ICD-10-CM | POA: Diagnosis not present

## 2016-01-07 DIAGNOSIS — Z4682 Encounter for fitting and adjustment of non-vascular catheter: Secondary | ICD-10-CM | POA: Diagnosis not present

## 2016-01-07 DIAGNOSIS — R079 Chest pain, unspecified: Secondary | ICD-10-CM | POA: Diagnosis not present

## 2016-01-07 DIAGNOSIS — E1122 Type 2 diabetes mellitus with diabetic chronic kidney disease: Secondary | ICD-10-CM | POA: Diagnosis not present

## 2016-01-07 DIAGNOSIS — R339 Retention of urine, unspecified: Secondary | ICD-10-CM

## 2016-01-07 DIAGNOSIS — E162 Hypoglycemia, unspecified: Secondary | ICD-10-CM | POA: Diagnosis not present

## 2016-01-07 DIAGNOSIS — K5901 Slow transit constipation: Secondary | ICD-10-CM | POA: Diagnosis not present

## 2016-01-07 DIAGNOSIS — N170 Acute kidney failure with tubular necrosis: Secondary | ICD-10-CM | POA: Diagnosis present

## 2016-01-07 HISTORY — DX: Presence of cardiac pacemaker: Z95.0

## 2016-01-07 HISTORY — DX: Presence of automatic (implantable) cardiac defibrillator: Z95.810

## 2016-01-07 HISTORY — DX: Chronic kidney disease, unspecified: N18.9

## 2016-01-07 LAB — CBC
HCT: 37.3 % — ABNORMAL LOW (ref 39.0–52.0)
HEMOGLOBIN: 12 g/dL — AB (ref 13.0–17.0)
MCH: 28.6 pg (ref 26.0–34.0)
MCHC: 32.2 g/dL (ref 30.0–36.0)
MCV: 88.8 fL (ref 78.0–100.0)
PLATELETS: 234 10*3/uL (ref 150–400)
RBC: 4.2 MIL/uL — AB (ref 4.22–5.81)
RDW: 14.8 % (ref 11.5–15.5)
WBC: 12 10*3/uL — ABNORMAL HIGH (ref 4.0–10.5)

## 2016-01-07 LAB — BASIC METABOLIC PANEL
ANION GAP: 12 (ref 5–15)
ANION GAP: 11 (ref 5–15)
Anion gap: 9 (ref 5–15)
BUN: 86 mg/dL — ABNORMAL HIGH (ref 6–20)
BUN: 83 mg/dL — ABNORMAL HIGH (ref 6–20)
BUN: 68 mg/dL — AB (ref 6–20)
CALCIUM: 9.5 mg/dL (ref 8.9–10.3)
CHLORIDE: 106 mmol/L (ref 101–111)
CHLORIDE: 95 mmol/L — AB (ref 101–111)
CO2: 21 mmol/L — AB (ref 22–32)
CO2: 20 mmol/L — AB (ref 22–32)
CO2: 28 mmol/L (ref 22–32)
CREATININE: 5.4 mg/dL — AB (ref 0.61–1.24)
CREATININE: 4.57 mg/dL — AB (ref 0.61–1.24)
Calcium: 9.1 mg/dL (ref 8.9–10.3)
Calcium: 9.7 mg/dL (ref 8.9–10.3)
Chloride: 100 mmol/L — ABNORMAL LOW (ref 101–111)
Creatinine, Ser: 5.89 mg/dL — ABNORMAL HIGH (ref 0.61–1.24)
GFR calc non Af Amer: 8 mL/min — ABNORMAL LOW (ref >60)
GFR calc non Af Amer: 9 mL/min — ABNORMAL LOW (ref >60)
GFR, EST AFRICAN AMERICAN: 9 mL/min — AB (ref >60)
GFR, EST AFRICAN AMERICAN: 10 mL/min — AB (ref >60)
GFR, EST AFRICAN AMERICAN: 12 mL/min — AB (ref >60)
GFR, EST NON AFRICAN AMERICAN: 11 mL/min — AB (ref >60)
Glucose, Bld: 127 mg/dL — ABNORMAL HIGH (ref 65–99)
Glucose, Bld: 72 mg/dL (ref 65–99)
Glucose, Bld: 341 mg/dL — ABNORMAL HIGH (ref 65–99)
POTASSIUM: 5.2 mmol/L — AB (ref 3.5–5.1)
Potassium: 6.9 mmol/L (ref 3.5–5.1)
Potassium: 6.3 mmol/L (ref 3.5–5.1)
SODIUM: 137 mmol/L (ref 135–145)
SODIUM: 132 mmol/L — AB (ref 135–145)
Sodium: 133 mmol/L — ABNORMAL LOW (ref 135–145)

## 2016-01-07 LAB — URINALYSIS, ROUTINE W REFLEX MICROSCOPIC
Bilirubin Urine: NEGATIVE
Glucose, UA: NEGATIVE mg/dL
Ketones, ur: NEGATIVE mg/dL
NITRITE: NEGATIVE
PROTEIN: NEGATIVE mg/dL
SPECIFIC GRAVITY, URINE: 1.014 (ref 1.005–1.030)
pH: 5.5 (ref 5.0–8.0)

## 2016-01-07 LAB — I-STAT CHEM 8, ED
BUN: 101 mg/dL — AB (ref 6–20)
CHLORIDE: 102 mmol/L (ref 101–111)
Calcium, Ion: 1.17 mmol/L (ref 1.13–1.30)
Creatinine, Ser: 5.5 mg/dL — ABNORMAL HIGH (ref 0.61–1.24)
Glucose, Bld: 122 mg/dL — ABNORMAL HIGH (ref 65–99)
HEMATOCRIT: 42 % (ref 39.0–52.0)
Hemoglobin: 14.3 g/dL (ref 13.0–17.0)
POTASSIUM: 6.8 mmol/L — AB (ref 3.5–5.1)
SODIUM: 134 mmol/L — AB (ref 135–145)
TCO2: 25 mmol/L (ref 0–100)

## 2016-01-07 LAB — GLUCOSE, CAPILLARY
Glucose-Capillary: 71 mg/dL (ref 65–99)
Glucose-Capillary: 52 mg/dL — ABNORMAL LOW (ref 65–99)
Glucose-Capillary: 131 mg/dL — ABNORMAL HIGH (ref 65–99)

## 2016-01-07 LAB — URINE MICROSCOPIC-ADD ON: SQUAMOUS EPITHELIAL / LPF: NONE SEEN

## 2016-01-07 LAB — PROTIME-INR
INR: 1.42 (ref 0.00–1.49)
INR: 1.32 (ref 0.00–1.49)
PROTHROMBIN TIME: 16.5 s — AB (ref 11.6–15.2)
Prothrombin Time: 17.4 seconds — ABNORMAL HIGH (ref 11.6–15.2)

## 2016-01-07 LAB — TROPONIN I: TROPONIN I: 0.15 ng/mL — AB (ref <0.031)

## 2016-01-07 LAB — I-STAT ARTERIAL BLOOD GAS, ED
Acid-base deficit: 2 mmol/L (ref 0.0–2.0)
BICARBONATE: 24.5 meq/L — AB (ref 20.0–24.0)
O2 Saturation: 100 %
PCO2 ART: 50.8 mmHg — AB (ref 35.0–45.0)
PH ART: 7.292 — AB (ref 7.350–7.450)
PO2 ART: 245 mmHg — AB (ref 80.0–100.0)
TCO2: 26 mmol/L (ref 0–100)

## 2016-01-07 LAB — CBG MONITORING, ED
GLUCOSE-CAPILLARY: 59 mg/dL — AB (ref 65–99)
GLUCOSE-CAPILLARY: 120 mg/dL — AB (ref 65–99)

## 2016-01-07 LAB — I-STAT CG4 LACTIC ACID, ED: Lactic Acid, Venous: 2.99 mmol/L (ref 0.5–2.0)

## 2016-01-07 LAB — I-STAT TROPONIN, ED: TROPONIN I, POC: 0.03 ng/mL (ref 0.00–0.08)

## 2016-01-07 LAB — GLUCOSE, RANDOM: GLUCOSE: 189 mg/dL — AB (ref 65–99)

## 2016-01-07 LAB — MRSA PCR SCREENING: MRSA BY PCR: NEGATIVE

## 2016-01-07 LAB — APTT: APTT: 37 s (ref 24–37)

## 2016-01-07 MED ORDER — INSULIN ASPART 100 UNIT/ML IV SOLN
10.0000 [IU] | Freq: Once | INTRAVENOUS | Status: AC
  Administered 2016-01-07: 10 [IU] via INTRAVENOUS
  Filled 2016-01-07: qty 1

## 2016-01-07 MED ORDER — MIDAZOLAM HCL 2 MG/2ML IJ SOLN
2.0000 mg | Freq: Once | INTRAMUSCULAR | Status: AC
  Administered 2016-01-07: 2 mg via INTRAVENOUS

## 2016-01-07 MED ORDER — FENTANYL CITRATE (PF) 2500 MCG/50ML IJ SOLN
25.0000 ug/h | INTRAMUSCULAR | Status: DC
  Filled 2016-01-07: qty 50

## 2016-01-07 MED ORDER — MIDAZOLAM HCL 2 MG/2ML IJ SOLN
INTRAMUSCULAR | Status: AC
  Filled 2016-01-07: qty 2

## 2016-01-07 MED ORDER — SODIUM CHLORIDE 0.9 % IV SOLN
2000.0000 mL | Freq: Once | INTRAVENOUS | Status: DC
Start: 2016-01-07 — End: 2016-01-07

## 2016-01-07 MED ORDER — NOREPINEPHRINE BITARTRATE 1 MG/ML IV SOLN
0.0000 ug/min | INTRAVENOUS | Status: DC
  Administered 2016-01-07: 2 ug/min via INTRAVENOUS
  Filled 2016-01-07: qty 4

## 2016-01-07 MED ORDER — PANTOPRAZOLE SODIUM 40 MG IV SOLR
40.0000 mg | Freq: Every day | INTRAVENOUS | Status: DC
  Administered 2016-01-07 – 2016-01-09 (×3): 40 mg via INTRAVENOUS
  Filled 2016-01-07 (×3): qty 40

## 2016-01-07 MED ORDER — FENTANYL CITRATE (PF) 100 MCG/2ML IJ SOLN
INTRAMUSCULAR | Status: AC
  Filled 2016-01-07: qty 2

## 2016-01-07 MED ORDER — SODIUM CHLORIDE 0.9 % IV SOLN
INTRAVENOUS | Status: DC | PRN
  Administered 2016-01-07: 2000 mL via INTRAVENOUS

## 2016-01-07 MED ORDER — FENTANYL CITRATE (PF) 100 MCG/2ML IJ SOLN
50.0000 ug | Freq: Once | INTRAMUSCULAR | Status: AC
  Administered 2016-01-07: 50 ug via INTRAVENOUS

## 2016-01-07 MED ORDER — ASPIRIN 300 MG RE SUPP
300.0000 mg | RECTAL | Status: AC
  Administered 2016-01-07: 300 mg via RECTAL
  Filled 2016-01-07: qty 1

## 2016-01-07 MED ORDER — SODIUM BICARBONATE 8.4 % IV SOLN
INTRAVENOUS | Status: DC
  Administered 2016-01-08 – 2016-01-09 (×2): via INTRAVENOUS
  Filled 2016-01-07 (×4): qty 150

## 2016-01-07 MED ORDER — MIDAZOLAM HCL 2 MG/2ML IJ SOLN: 1.0000 mg | INTRAMUSCULAR | Status: DC | PRN

## 2016-01-07 MED ORDER — NOREPINEPHRINE BITARTRATE 1 MG/ML IV SOLN
0.0000 ug/min | INTRAVENOUS | Status: DC
  Administered 2016-01-08: 5 ug/min via INTRAVENOUS
  Filled 2016-01-07 (×2): qty 4

## 2016-01-07 MED ORDER — DEXTROSE 50 % IV SOLN
25.0000 mL | Freq: Once | INTRAVENOUS | Status: AC
  Administered 2016-01-07: 25 mL via INTRAVENOUS
  Filled 2016-01-07: qty 50

## 2016-01-07 MED ORDER — CHLORHEXIDINE GLUCONATE 0.12% ORAL RINSE (MEDLINE KIT)
15.0000 mL | Freq: Two times a day (BID) | OROMUCOSAL | Status: DC
  Administered 2016-01-07 – 2016-01-09 (×5): 15 mL via OROMUCOSAL

## 2016-01-07 MED ORDER — MIDAZOLAM HCL 2 MG/2ML IJ SOLN
1.0000 mg | INTRAMUSCULAR | Status: DC | PRN
  Administered 2016-01-07: 2 mg via INTRAVENOUS
  Administered 2016-01-08: 1 mg via INTRAVENOUS
  Administered 2016-01-08: 2 mg via INTRAVENOUS
  Administered 2016-01-08 (×2): 1 mg via INTRAVENOUS
  Filled 2016-01-07 (×4): qty 2

## 2016-01-07 MED ORDER — DEXTROSE 50 % IV SOLN
1.0000 | Freq: Once | INTRAVENOUS | Status: AC
  Administered 2016-01-07: 50 mL via INTRAVENOUS
  Filled 2016-01-07: qty 50

## 2016-01-07 MED ORDER — ANTISEPTIC ORAL RINSE SOLUTION (CORINZ)
7.0000 mL | OROMUCOSAL | Status: DC
  Administered 2016-01-07 – 2016-01-09 (×21): 7 mL via OROMUCOSAL

## 2016-01-07 MED ORDER — HEPARIN SODIUM (PORCINE) 5000 UNIT/ML IJ SOLN
5000.0000 [IU] | Freq: Three times a day (TID) | INTRAMUSCULAR | Status: DC
  Administered 2016-01-07 – 2016-01-10 (×8): 5000 [IU] via SUBCUTANEOUS
  Filled 2016-01-07 (×8): qty 1

## 2016-01-07 MED ORDER — MIDAZOLAM HCL 2 MG/2ML IJ SOLN
1.0000 mg | INTRAMUSCULAR | Status: DC | PRN
  Administered 2016-01-07: 1 mg via INTRAVENOUS

## 2016-01-07 MED ORDER — FENTANYL CITRATE (PF) 100 MCG/2ML IJ SOLN
50.0000 ug | Freq: Once | INTRAMUSCULAR | Status: AC
Start: 2016-01-07 — End: 2016-01-07
  Administered 2016-01-07: 50 ug via INTRAVENOUS

## 2016-01-07 MED ORDER — ALBUTEROL SULFATE (2.5 MG/3ML) 0.083% IN NEBU
10.0000 mg | INHALATION_SOLUTION | Freq: Once | RESPIRATORY_TRACT | Status: AC
  Administered 2016-01-07: 10 mg via RESPIRATORY_TRACT
  Filled 2016-01-07: qty 12

## 2016-01-07 MED ORDER — ATROPINE SULFATE 1 MG/ML IJ SOLN
INTRAMUSCULAR | Status: DC | PRN
  Administered 2016-01-07: 1 mg via INTRAVENOUS

## 2016-01-07 MED ORDER — FENTANYL BOLUS VIA INFUSION
25.0000 ug | INTRAVENOUS | Status: DC | PRN
  Administered 2016-01-07: 25 ug via INTRAVENOUS
  Filled 2016-01-07: qty 25

## 2016-01-07 MED ORDER — INSULIN ASPART 100 UNIT/ML ~~LOC~~ SOLN
0.0000 [IU] | SUBCUTANEOUS | Status: DC
  Administered 2016-01-09: 2 [IU] via SUBCUTANEOUS
  Administered 2016-01-09 (×2): 3 [IU] via SUBCUTANEOUS
  Administered 2016-01-09 (×2): 2 [IU] via SUBCUTANEOUS
  Administered 2016-01-10: 3 [IU] via SUBCUTANEOUS
  Administered 2016-01-10 (×2): 2 [IU] via SUBCUTANEOUS
  Administered 2016-01-10 – 2016-01-11 (×2): 3 [IU] via SUBCUTANEOUS
  Administered 2016-01-11: 2 [IU] via SUBCUTANEOUS
  Administered 2016-01-11 – 2016-01-12 (×4): 3 [IU] via SUBCUTANEOUS
  Administered 2016-01-12: 2 [IU] via SUBCUTANEOUS
  Administered 2016-01-12: 3 [IU] via SUBCUTANEOUS
  Administered 2016-01-13: 2 [IU] via SUBCUTANEOUS

## 2016-01-07 MED ORDER — DEXTROSE 50 % IV SOLN
INTRAVENOUS | Status: AC
  Filled 2016-01-07: qty 50

## 2016-01-07 MED ORDER — SODIUM CHLORIDE 0.9 % IV SOLN
25.0000 ug/h | INTRAVENOUS | Status: DC
  Administered 2016-01-07: 50 ug/h via INTRAVENOUS
  Filled 2016-01-07: qty 50

## 2016-01-07 MED ORDER — DEXTROSE 50 % IV SOLN
1.0000 | Freq: Once | INTRAVENOUS | Status: AC
  Administered 2016-01-07: 50 mL via INTRAVENOUS

## 2016-01-07 MED ORDER — SODIUM CHLORIDE 0.9 % IV SOLN
1.0000 g | Freq: Once | INTRAVENOUS | Status: AC
  Administered 2016-01-07: 1 g via INTRAVENOUS
  Filled 2016-01-07: qty 10

## 2016-01-07 MED ORDER — DEXTROSE 5 % IV SOLN
Freq: Once | INTRAVENOUS | Status: AC
  Administered 2016-01-07: 15:00:00 via INTRAVENOUS
  Filled 2016-01-07: qty 100

## 2016-01-07 NOTE — ED Notes (Addendum)
Per EMS- pt [place]. Pt was initially bradycardic with a pacemaker. Pt was then paced, went asystole (CPR 15 minutes), regained pulses at 180 then brady again. Pt arrived to ED paced.

## 2016-01-07 NOTE — ED Notes (Signed)
[employer] rep at bedside interrogating and making needed changes.

## 2016-01-07 NOTE — ED Provider Notes (Signed)
CSN: [id number]     Arrival date & time 01/07/16  1156 History   First MD Initiated Contact with Patient 01/07/16 1235     Chief Complaint  Patient presents with  . Cardiac Arrest     (Consider location/radiation/quality/duration/timing/severity/associated sxs/prior Treatment) HPI This is an 82 year old and man brought in by EMS with report that they were called for him being unresponsive. They report on their arrival that he was bradycardic with agonal respirations. He suffered a cardiac arrest after their initial evaluation and CPR was performed. He received multiple doses of epinephrine. They state that he was bradycardic despite his pacemaker firing. They did an override pace making externally. He began having pulses at some point and route. He was intubated prior to this. Here. He is attempting some to breathe over the vent. He has palpable pulses with blood pressures in the 80s he was on an epinephrine drip from EMS. He is initially entered into the computer under a different medical record number and this chart is to be emerged his original chart. Original chart shows that he has a defibrillator previously placed. No past medical history on file. No past surgical history on file. No family history on file. Social History  Substance Use Topics  . Smoking status: Not on file  . Smokeless tobacco: Not on file  . Alcohol Use: Not on file    Review of Systems  Unable to perform ROS: Acuity of condition      Allergies  Review of patient's allergies indicates not on file.  Home Medications   Prior to Admission medications   Not on File   BP 101/47 mmHg  Pulse 86  Resp 16  Wt 68.04 kg  SpO2 100% Physical Exam  Constitutional: He appears well-developed. No distress.  Intubated postarrest  HENT:  Head: Normocephalic and atraumatic.  Nose: Nose normal.  Mouth/Throat: Oropharynx is clear and moist.  Eyes: Pupils are equal, round, and reactive to light.  Neck: Normal range  of motion.  Cardiovascular: Bradycardia present.   Pulmonary/Chest:  Intubated with equal breath sounds  Abdominal: Soft. He exhibits distension.  Musculoskeletal:  Patient with good tone No obvious deformities I/O right lower extremity   Neurological: No cranial nerve deficit.  Patient moving all 4 extremities. Initially unresponsive. He has become more responsive and is opening eyes to command line no lateralized deficits noted  Skin: Skin is warm and dry.  Nursing note and vitals reviewed.   ED Course  Procedures (including critical care time) Labs Review Labs Reviewed  BASIC METABOLIC PANEL - Abnormal; Notable for the following:    Sodium 133 (*)    Potassium 6.9 (*)    Chloride 100 (*)    CO2 21 (*)    Glucose, Bld 127 (*)    BUN 86 (*)    Creatinine, Ser 5.89 (*)    GFR calc non Af Amer 8 (*)    GFR calc Af Amer 9 (*)    All other components within normal limits  CBC - Abnormal; Notable for the following:    WBC 12.0 (*)    RBC 4.20 (*)    Hemoglobin 12.0 (*)    HCT 37.3 (*)    All other components within normal limits  PROTIME-INR - Abnormal; Notable for the following:    Prothrombin Time 17.4 (*)    All other components within normal limits  I-STAT ARTERIAL BLOOD GAS, ED - Abnormal; Notable for the following:    pH, Arterial 7.292 (*)  pCO2 arterial 50.8 (*)    pO2, Arterial 245.0 (*)    Bicarbonate 24.5 (*)    All other components within normal limits  I-STAT CG4 LACTIC ACID, ED - Abnormal; Notable for the following:    Lactic Acid, Venous 2.99 (*)    All other components within normal limits  I-STAT CHEM 8, ED - Abnormal; Notable for the following:    Sodium 134 (*)    Potassium 6.8 (*)    BUN 101 (*)    Creatinine, Ser 5.50 (*)    Glucose, Bld 122 (*)    All other components within normal limits  BASIC METABOLIC PANEL  I-STAT TROPOININ, ED  I-STAT ARTERIAL BLOOD GAS, ED    Imaging Review Dg Chest Portable 1 View  01/07/2016  CLINICAL DATA:   Intubation after cardiac arrest EXAM: PORTABLE CHEST 1 VIEW COMPARISON:  None. FINDINGS: Endotracheal tube tip is halfway between the expected location of the carina and the clavicular heads. Cardiomegaly with biventricular ICD/ pacer and coronary stent. Negative aortic and hilar contours. Left basilar density is likely from the cardiomegaly. There is no edema, consolidation, effusion, or pneumothorax. Asymmetric density at the right apex is likely osseous. Anticipate follow-up radiographs. No visible rib fracture. IMPRESSION: 1. Unremarkable positioning of the endotracheal tube. 2. Cardiomegaly without failure.  Biventricular ICD/pacer. 3. Asymmetric density at the right apex is favored osseous. After convalescence recommend two-view study. Electronically Signed   By: Monte Fantasia M.D.   On: 01/07/2016 12:55   I have personally reviewed and evaluated these images and lab results as part of my medical decision-making.   EKG Interpretation   Date/Time:  Friday January 07 2016 12:10:15 EDT Ventricular Rate:  50 PR Interval:  160 QRS Duration: 172 QT Interval:  492 QTC Calculation: 449 R Axis:   -82 Text Interpretation:  VENTRICULAR PACED RHYTHM Confirmed by Issa Luster MD,  Andee Poles [id number]) on 01/07/2016 1:08:45 PM      MDM   Final diagnoses:  Hypotension, unspecified hypotension type  Cardiac arrest [facility])    Cardiology in ED and evaluated. On irrigation of defibrillator noted that he had V. fib and was defibrillated. Currently patient on nor epi drip to maintain blood pressure. He has received 2 L normal saline. Patient in new-onset renal failure with hypokalemia 1- Cardiac arrest- patient had v-fib arrest by defibrillator interrogation.  However asystolic arrest by ems witnessed later- patient not cooled as initial rhythm bradycardia.  Patient now mentating and cooling not indicated. 2- new onset renal failure- last creatinine 2.03, now 5 3- hyperkalemia- likely secondary to #2-being treated  with calcium, insulin, d50  CRITICAL CARE Performed by: Emani Morad S Total critical care time: 90 minutes Critical care time was exclusive of separately billable procedures and treating other patients. Critical care was necessary to treat or prevent imminent or life-threatening deterioration. Critical care was time spent personally by me on the following activities: development of treatment plan with patient and/or surrogate as well as nursing, discussions with consultants, evaluation of patient's response to treatment, examination of patient, obtaining history from patient or surrogate, ordering and performing treatments and interventions, ordering and review of laboratory studies, ordering and review of radiographic studies, pulse oximetry and re-evaluation of patient's condition.    Pattricia Boss, MD 01/07/16 1357

## 2016-01-07 NOTE — Consult Note (Signed)
[facility] Renal Consultation Note  Requesting MD: Byrum Indication for Consultation: A on CRF  HPI:  [name] is a 82 y.o. male with ischemic cardiomyopathy s/p ICD, chronic combined CHF (EF 20-25%), hypertension, hyperlipidemia, CAD s/p PCA and MI, chronic RBBB, prolonged QTc, chronic pericardial effusion, aortic stenosis, PVD, [name] Stage 3, insulin dependent Type 2 DM, chronic anemia, TIA, and chronic low back pain who was hospitalized form 2/16-2/20 for acute on chronic combined CHF in setting of dietary indiscretion.  He received IV diuresis with return to baseline volume and breathing status. His discharge weight was 165 lb. His hospital course was complicated by AKI on [name] Stage 3 due to increase in ACEi therapy and aggressive IV diuresis. He also had urinary retention requiring foley catheter placement with leg bag and complicated coag neg staph UTI requiring ciprofloxacin for 10 days.  His creatinine peaked at 3.5- but improved and actually came down to 2.0 on February 27. Patient had been taking an ACE inhibitor as an outpatient. He was brought into the emergency department after being found down at his assisted living with bradycardia in agonal breathing. CPR was performed successfully resuscitated. AICD interrogation shows a ventricular fibrillation followed by shock arrest.  Initial labs showed a creatinine of 5.89 and a potassium of 6.9.  Since being resuscitated he's been hemodynamically stable since actually had about 700 of urine out- to recheck potassium 6.3.  He was given insulin, glucose and calcium and is currently on a bicarbonate drip.   CREATININE, SER  Date/Time Value Ref Range Status  01/07/2016 01:03 PM 5.40* 0.61 - 1.24 mg/dL Final  01/07/2016 12:20 PM 5.89* 0.61 - 1.24 mg/dL Final  01/07/2016 12:20 PM 5.50* 0.61 - 1.24 mg/dL Final     PMHx:  History reviewed. No pertinent past medical history.  History reviewed. No pertinent past surgical  history.  Family Hx: History reviewed. No pertinent family history.  Social History:  has no tobacco, alcohol, and drug history on file.  Allergies: No Known Allergies  Medications: Prior to Admission medications   Not on File    I have reviewed the patient's current medications.  Labs:  Results for orders placed or performed during the hospital encounter of 01/07/16 (from the past 48 hour(s))  I-Stat arterial blood gas, ED     Status: Abnormal   Collection Time: 01/07/16 12:16 PM  Result Value Ref Range   pH, Arterial 7.292 (L) 7.350 - 7.450   pCO2 arterial 50.8 (H) 35.0 - 45.0 mmHg   pO2, Arterial 245.0 (H) 80.0 - 100.0 mmHg   Bicarbonate 24.5 (H) 20.0 - 24.0 mEq/L   TCO2 26 0 - 100 mmol/L   O2 Saturation 100.0 %   Acid-base deficit 2.0 0.0 - 2.0 mmol/L   Patient temperature HIDE    Sample type ARTERIAL   Basic metabolic panel     Status: Abnormal   Collection Time: 01/07/16 12:20 PM  Result Value Ref Range   Sodium 133 (L) 135 - 145 mmol/L   Potassium 6.9 (HH) 3.5 - 5.1 mmol/L    Comment: SPECIMEN HEMOLYZED. HEMOLYSIS MAY AFFECT INTEGRITY OF RESULTS. CRITICAL RESULT CALLED TO, READ BACK BY AND VERIFIED WITH: NICKY SIMMONS,RN AT 8938 01/07/16 BY ZBEECH.    Chloride 100 (L) 101 - 111 mmol/L   CO2 21 (L) 22 - 32 mmol/L   Glucose, Bld 127 (H) 65 - 99 mg/dL   BUN 86 (H) 6 - 20 mg/dL   Creatinine, Ser 5.89 (H) 0.61 -  1.24 mg/dL   Calcium 9.5 8.9 - 10.3 mg/dL   GFR calc non Af Amer 8 (L) >60 mL/min   GFR calc Af Amer 9 (L) >60 mL/min    Comment: (NOTE) The eGFR has been calculated using the [name] EPI equation. This calculation has not been validated in all clinical situations. eGFR's persistently <60 mL/min signify possible Chronic Kidney Disease.    Anion gap 12 5 - 15  CBC     Status: Abnormal   Collection Time: 01/07/16 12:20 PM  Result Value Ref Range   WBC 12.0 (H) 4.0 - 10.5 K/uL   RBC 4.20 (L) 4.22 - 5.81 MIL/uL   Hemoglobin 12.0 (L) 13.0 - 17.0 g/dL   HCT  37.3 (L) 39.0 - 52.0 %   MCV 88.8 78.0 - 100.0 fL   MCH 28.6 26.0 - 34.0 pg   MCHC 32.2 30.0 - 36.0 g/dL   RDW 14.8 11.5 - 15.5 %   Platelets 234 150 - 400 K/uL  I-stat troponin, ED     Status: None   Collection Time: 01/07/16 12:20 PM  Result Value Ref Range   Troponin i, poc 0.03 0.00 - 0.08 ng/mL   Comment 3            Comment: Due to the release kinetics of cTnI, a negative result within the first hours of the onset of symptoms does not rule out myocardial infarction with certainty. If myocardial infarction is still suspected, repeat the test at appropriate intervals.   Protime-INR (order if Patient is taking Coumadin / Warfarin)     Status: Abnormal   Collection Time: 01/07/16 12:20 PM  Result Value Ref Range   Prothrombin Time 17.4 (H) 11.6 - 15.2 seconds   INR 1.42 0.00 - 1.49  I-Stat Chem 8, ED     Status: Abnormal   Collection Time: 01/07/16 12:20 PM  Result Value Ref Range   Sodium 134 (L) 135 - 145 mmol/L   Potassium 6.8 (HH) 3.5 - 5.1 mmol/L   Chloride 102 101 - 111 mmol/L   BUN 101 (H) 6 - 20 mg/dL   Creatinine, Ser 5.50 (H) 0.61 - 1.24 mg/dL   Glucose, Bld 122 (H) 65 - 99 mg/dL   Calcium, Ion 1.17 1.13 - 1.30 mmol/L   TCO2 25 0 - 100 mmol/L   Hemoglobin 14.3 13.0 - 17.0 g/dL   HCT 42.0 39.0 - 52.0 %   Comment NOTIFIED PHYSICIAN   I-Stat CG4 Lactic Acid, ED     Status: Abnormal   Collection Time: 01/07/16 12:21 PM  Result Value Ref Range   Lactic Acid, Venous 2.99 (HH) 0.5 - 2.0 mmol/L   Comment NOTIFIED PHYSICIAN   Basic metabolic panel     Status: Abnormal   Collection Time: 01/07/16  1:03 PM  Result Value Ref Range   Sodium 137 135 - 145 mmol/L   Potassium 6.3 (HH) 3.5 - 5.1 mmol/L    Comment: NO VISIBLE HEMOLYSIS CRITICAL RESULT CALLED TO, READ BACK BY AND VERIFIED WITH: NICKY SIMMONS,RN AT 1359 01/07/16 BY ZBEECH.    Chloride 106 101 - 111 mmol/L   CO2 20 (L) 22 - 32 mmol/L   Glucose, Bld 72 65 - 99 mg/dL   BUN 83 (H) 6 - 20 mg/dL   Creatinine,  Ser 5.40 (H) 0.61 - 1.24 mg/dL   Calcium 9.1 8.9 - 10.3 mg/dL   GFR calc non Af Amer 9 (L) >60 mL/min   GFR calc Af Amer 10 (L) >  60 mL/min    Comment: (NOTE) The eGFR has been calculated using the [name] EPI equation. This calculation has not been validated in all clinical situations. eGFR's persistently <60 mL/min signify possible Chronic Kidney Disease.    Anion gap 11 5 - 15     ROS:  Review of systems not obtained due to patient factors.  Physical Exam: Filed Vitals:   01/07/16 1449 01/07/16 1515  BP:  122/69  Pulse:  78  Temp: 96.5 F (35.8 C)   Resp:  22     General: Thin black male who is intubated.  He is not responsive to me right now  HEENT: Pupils are reactive - mucous membranes moist  Neck: No JVD  Heart: Regular rate and rhythm  Lungs: Mostly clear  Abdomen: Soft, nontender  Extremities: No peripheral edema  Skin: Warm and dry  Neuro: Unresponsive at present- but does withdrawal to noxious stimuli  Assessment/Plan: 82 year old black male with an extensive cardiac history - recent acute kidney injury and also CK D with creatinine of 2 at the end of February. He now presents with acute on chronic renal failure in the setting of a cardiac arrest on lisinopril  1.Renal- acute on chronic kidney injury in the setting of a cardiac arrest and also on lisinopril. I suspect  is secondary to ATN. The good news is that right now he is making urine.  My feeling is that if his hemodynamics can be maintained and he continues to make urine I have a pretty good feeling about renal recovery.  I have discussed with CCM however that if central line is needed- they may want to place trialysis "just in case"  2. Hyperkalemia  - in the setting of ATN/AK and lisinopril therapy. Obviously, lisinopril is on hold. He has received calcium, insulin, dextrose and is on a sodium bicarbonate drip.  In addition, he is making urine. Therefore, I hope that his potassium will trend in the correct  direction.  I have ordered another check for later this afternoon/evening  3. Anemia  - is not an issue right now  Thank you for this consultation. I will continue to follow with you  Doninique Lwin A 01/07/2016, 3:49 PM

## 2016-01-07 NOTE — ED Notes (Signed)
Critical care MD at bedside 

## 2016-01-07 NOTE — Consult Note (Signed)
ELECTROPHYSIOLOGY CONSULT NOTE    Patient ID: [name] MRN: [id number], DOB/AGE: 80-Sep-1932 80 y.o.  Admit date: 01/07/2016 Date of Consult: 01/07/2016  Primary Physician: No primary care provider on file. Primary Cardiologist/cardiologist: Dr. Lovena Le   Reason for Consultation: Cardiac arrest  HPI: Obtained entirely from the chart and ER MD.  [name] is a 82 y.o. male With PMHx of CAD (last PCI noted 2005), ICM, CHF, DM, HTN, HLD, PVD with left carotid stent 2004, and CRI was brought to [facility] via EMS.  Unknown who found the patient down or for how long he was, EMS found him unresponsive with agonal breathing.     Inpatient Medications:  . albuterol  10 mg Nebulization Once  . dextrose  1 ampule Intravenous Once  . fentaNYL      . insulin aspart  10 Units Intravenous Once  . midazolam        Allergies: No Known Allergies  Social History   Social History  . Marital Status: Widowed    Spouse Name: N/A  . Number of Children: N/A  . Years of Education: N/A   Occupational History  . Not on file.   Social History Main Topics  . Smoking status: Not on file  . Smokeless tobacco: Not on file  . Alcohol Use: Not on file  . Drug Use: Not on file  . Sexual Activity: Not on file   Other Topics Concern  . Not on file   Social History Narrative  . No narrative on file     Family history noted on his other chart noted for DM by his mother and brother  Review of Systems: Unable to obtain, intubated  Physical Exam: Filed Vitals:   01/07/16 1300 01/07/16 1330 01/07/16 1340 01/07/16 1345  BP: 105/73   88/52  Pulse: 67  78 77  Temp:  95.6 F (35.3 C)    TempSrc:  Rectal    Resp: 16  15 14   Weight:      SpO2: 100%  100% 100%    GEN- intubated, and does not follow commands HEENT: endotracheal tube is in place.. Lymph- no cervical lymphadenopathy Lungs- Clear to ausculation bilaterally, normal work of breathing.  Scattered rales are present  bilaterally.Marland Kitchen Heart- Regular rate and rhythm, no murmurs, rubs or gallops, PMI is laterally displaced GI- soft, non-tender, non-distended, bowel sounds present Extremities- no clubbing, cyanosis, or edema; DP/PT/radial pulses 2+ bilaterally MS- no significant deformity or atrophy Skin- warm and dry, no rash or lesion Psych- euthymic mood, full affect Neuro- unresponsive to verbal  Commands. Withdraws to painful stimulation.  Labs:   Lab Results  Component Value Date   WBC 12.0* 01/07/2016   HGB 12.0* 01/07/2016   HGB 14.3 01/07/2016   HCT 37.3* 01/07/2016   HCT 42.0 01/07/2016   MCV 88.8 01/07/2016   PLT 234 01/07/2016    Recent Labs Lab 01/07/16 1303  NA 137  K 6.3*  CL 106  CO2 20*  BUN 83*  CREATININE 5.40*  CALCIUM 9.1  GLUCOSE 72      Radiology/Studies:  Dg Chest Portable 1 View 01/07/2016  CLINICAL DATA:  Intubation after cardiac arrest EXAM: PORTABLE CHEST 1 VIEW COMPARISON:  None. FINDINGS: Endotracheal tube tip is halfway between the expected location of the carina and the clavicular heads. Cardiomegaly with biventricular ICD/ pacer and coronary stent. Negative aortic and hilar contours. Left basilar density is likely from the cardiomegaly. There is no edema, consolidation, effusion, or pneumothorax.  Asymmetric density at the right apex is likely osseous. Anticipate follow-up radiographs. No visible rib fracture. IMPRESSION: 1. Unremarkable positioning of the endotracheal tube. 2. Cardiomegaly without failure.  Biventricular ICD/pacer. 3. Asymmetric density at the right apex is favored osseous. After convalescence recommend two-view study. Electronically Signed   By: Monte Fantasia M.D.   On: 01/07/2016 12:55    EKG: SB, V pacing, RBBB, LAD, no clear ischemic changes TELEMETRY:  DEVICE HISTORY: MDT, BiVe ICD, implanted 2014, Dr. Lovena Le (original device 2005) 11/11/15: Echocardiogram EF 20-25% Akinesis of the entire apical myocarium Akinesis of mid inferoseptal  and apical septum Akinesis of mid apicalinferior myocardium Akinesis of mid apicallateral myocardium Akinesis of the entire anterior myocardium Akinesis of the apical inferolateral myocardium Akinesis of mid anteroseptal myocardium AS may be underestimated, severly calcified annuluis, restructed AV mobility Moderate free-flowing pericardial effusion identified circumfriential to the heart.  The fluid had no internal echos  Assessment and Plan:   1. Cardiac arrest     Unknown time down, unknown specifics regarding events     Record states EMS found the patient bradycardic with agonal breathing, also mentions bradycardia despite paced rhythm and CPR, multiple doses of epinephrine, was given and externally paced, he had VF event and was shocked by his ICD to paced rhythm.  He was intubated by EMS.  He has been observed on a couple occassions to breath over vent  2. Acute on chronic renal failure 3. Hyperkalemic 4. Shock     onlevophed   5. CAD (PCI 2005)  6. ICM, EF 20% by last echo in Feb 2017     Does not appear to be in an acute exacerbation     MDT ICD appears to be functioning normally, noting VF event with 1 shock, successful  7. Known mod-large pericardial effusion     dates back to Jan 2016  Continue supportive measures, will need to await/evaluate extent of neuro recovery  EP attending  Patient seen and examined. I have reviewed the findings as n Robert Pont, PA-C. The patient is an 82 year old man who I have known for many years. He has an ischemic cardiomyopathy which has been stable and the patient has had no anginal symptoms. He has had several EP procedures over the years. He has had class II heart failure symptoms.  He was in his usual state of health until earlier today when he was found down and brought to the emergency room and successfully resuscitated. Interrogation of his ICD demonstrates an episode of ventricular fibrillation that occur around 11:30. He was  successfully resuscitated. He was successfully intubated  And  Initial laboratory evaluation demonstrates multiple derangements. He has chronic renal insufficiency  A creatinine in the 2-3 range and now over 6. Interrogation of his ICD demonstrates that for the last 3 weeks, his activity has been markedly reduced. We have no additional history at this time. At this point,it is appropriate to recommend  General conservative therapy. I would not suggest intravenous amiodarone unless he has more malignant  Ventricular arrhythmias. If he recovers his neurologic status, and his kidney function  improvesback to baseline,  We would suggest additional EP evaluation.    Robert Night, PA-C 01/07/2016 2:09 PM [name], M.D.

## 2016-01-07 NOTE — H&P (Signed)
PULMONARY / CRITICAL CARE MEDICINE   Name: [name] MRN: [id number] DOB: [birth year]    ADMISSION DATE:  01/07/2016   REFERRING MD: EDP  CHIEF COMPLAINT:  V fib arrest  HISTORY OF PRESENT ILLNESS:   [name] , long term [place] cardiology pt who has known CHF and has AICD in place who was in his usual state of health and at his [facility] when he was found with bradycardia and agonal breathing. He was coded x 15 minutes with CPR. Noted by AICD interrogation he had a Vfib event and AICD delivered shock prior to CPR. He will admitted and undergo normothermic protocol. Cardiology is aware of this admission.  PAST MEDICAL HISTORY :  . Anemia   . Cataracts, bilateral   . Chronic systolic CHF (congestive heart failure) ([place])     a. ischemic CM EF 15-20%; b. s/p AICD 05/24/04; c. Echo 7/06: EF 30-40%, mild reduced RVSF  . CAD (coronary artery disease)     a. s/p AMI, s/p PTCA & stent of cx 12/04; b. LHC (5/05): Proximal LAD 100% with bridging collaterals (CTO), proximal circumflex 20%, mid circumflex 95% ISR, proximal-mid RCA 75%, EF 20% >>PCI: 3.0 x 28 mm Cypher DES to the mid CFX  . Diverticulosis of colon   . Hyperlipidemia   . PVD (peripheral vascular disease) ([place])     s/p L carotid PTCA/stent 2004  . Transient ischemic attack   . Erectile dysfunction   . Hyperkalemia 08/2008    K=5.7   . BBB (bundle branch block)     right  . HTN (hypertension)   . Elevated PSA   . Adenomatous colon polyp 02/14/2012  . ICD (implantable cardiac defibrillator) in place 12-25-2012    MDT CRTD upgrade by Dr Lovena Le  . Pacemaker   . Type II diabetes mellitus ([place])   . Myocardial infarction ([place]) 1990  . Arthritis     "right hip" (12/25/2012)  . Carotid stenosis     a. s/p L carotid stent 2004; b. Carotid US (09/2014): Bilateral ICA 1-39%, left ECA >59%, normal subclavian bilaterally, occluded left vertebral >> FU 2  years  . Pericardial effusion     Echocardiogram (09/2014): EF 25% with distal anterior, distal inferior, distal lateral and apical akinesis, grade 1 diastolic dysfunction, very mild aortic stenosis (mean 7 mmHg) - this may be depressed due to low EF (2-D images suggest mild to moderate aortic stenosis), large pericardial effusion, no RA collapse         Family History  Problem Relation Age of Onset  . Diabetes Mother   . Diabetes Brother   . Heart attack Neg Hx   . Stroke Neg Hx    Social History   Social History  . Marital Status: Widowed    Spouse Name: N/A  . Number of Children: N/A  . Years of Education: 12   Occupational History  . retired    Social History Main Topics  . Smoking status: Former Smoker    Types: Cigarettes    Quit date: 12/27/1967  . Smokeless tobacco: Never Used  . Alcohol Use: No     Comment: 12/25/2012 "quit all alcohol 60 yr ago"  . Drug Use: No  . Sexual Activity: Not on file          REVIEW OF SYSTEMS:   NA  SUBJECTIVE:  [name]  VITAL SIGNS: BP 120/61 mmHg  Pulse 77  Temp(Src) 95.6 F (35.3 C) (Rectal)  Resp 14  Wt 150 lb (68.04 kg)  SpO2 100%  HEMODYNAMICS:    VENTILATOR SETTINGS: Vent Mode:  [-] PRVC FiO2 (%):  [100 %] 100 % Set Rate:  [14 bmp] 14 bmp Vt Set:  [550 mL] 550 mL PEEP:  [5 cmH20] 5 cmH20 Plateau Pressure:  [22 cmH20] 22 cmH20  INTAKE / OUTPUT:    PHYSICAL EXAMINATION: General:  [name], on v ent  Neuro: No follows commands, opens eyes when startled. Jerky rigidity noted  HEENT:  No j\vd noted Cardiovascular: HSR RRR Lungs:  Coarse rhonchi Abdomen: + bs bilateral Musculoskeletal:  intact Skin: chronic lower ext skin chan ges  LABS:  BMET  Recent Labs Lab 01/07/16 1220 01/07/16 1303  NA 133*  134* 137  K 6.9*  6.8* 6.3*  CL 100*  102 106  CO2 21* 20*  BUN 86*  101* 83*  CREATININE  5.89*  5.50* 5.40*  GLUCOSE 127*  122* 72    Electrolytes  Recent Labs Lab 01/07/16 1220 01/07/16 1303  CALCIUM 9.5 9.1    CBC  Recent Labs Lab 01/07/16 1220  WBC 12.0*  HGB 12.0*  14.3  HCT 37.3*  42.0  PLT 234    Coag's  Recent Labs Lab 01/07/16 1220  INR 1.42    Sepsis Markers  Recent Labs Lab 01/07/16 1221  LATICACIDVEN 2.99*    ABG  Recent Labs Lab 01/07/16 1216  PHART 7.292*  PCO2ART 50.8*  PO2ART 245.0*    Liver Enzymes No results for input(s): AST, ALT, ALKPHOS, BILITOT, ALBUMIN in the last 168 hours.  Cardiac Enzymes No results for input(s): TROPONINI, PROBNP in the last 168 hours.  Glucose No results for input(s): GLUCAP in the last 168 hours.  Imaging Dg Chest Portable 1 View  01/07/2016  CLINICAL DATA:  Intubation after cardiac arrest EXAM: PORTABLE CHEST 1 VIEW COMPARISON:  None. FINDINGS: Endotracheal tube tip is halfway between the expected location of the carina and the clavicular heads. Cardiomegaly with biventricular ICD/ pacer and coronary stent. Negative aortic and hilar contours. Left basilar density is likely from the cardiomegaly. There is no edema, consolidation, effusion, or pneumothorax. Asymmetric density at the right apex is likely osseous. Anticipate follow-up radiographs. No visible rib fracture. IMPRESSION: 1. Unremarkable positioning of the endotracheal tube. 2. Cardiomegaly without failure.  Biventricular ICD/pacer. 3. Asymmetric density at the right apex is favored osseous. After convalescence recommend two-view study. Electronically Signed   By: Monte Fantasia M.D.   On: 01/07/2016 12:55     STUDIES:    CULTURES:   ANTIBIOTICS:   SIGNIFICANT EVENTS: 4/14 vfib event leading to resp failure along with MODS  LINES/TUBES: Left biventricular AICD  DISCUSSION: Post arrest  ASSESSMENT / PLAN:  PULMONARY A: VDRF post v fib arrest CHF IACD P:   Vent bundle BD's  CARDIOVASCULAR A:  Hx of IACD  implantation  CHF Post V fib event leading to cardiac arrest HTN P:  Normothermic temperature inductikon  RENAL Lab Results  Component Value Date   CREATININE 5.40* 01/07/2016   CREATININE 5.89* 01/07/2016   CREATININE 5.50* 01/07/2016   Base creatine 2.03 2/17 A:   Acute renal failure P:   Follow creatine  Call renal  GASTROINTESTINAL A:   GI protection P:   PPI  HEMATOLOGIC A:   No acute issue P:    INFECTIOUS A:   Possible aspiration during 16 cpr Elevated wbc 12 P:   ? Aspiration coverage  ENDOCRINE A:   DM P:  SSI q 4h   NEUROLOGIC A:   AMS, post cardiac arrest(15 min CPR) P:   RASS goal:0 Sedate as needed for tube tolerance CT head for possible neuro cause neuro cause   FAMILY  - Updates: Updated at bedside per DR. Lamonte Sakai  - Inter-disciplinary family meet or Palliative Care meeting due by:  day 7    .Richardson Landry Minor ACNP Maryanna Shape PCCM Pager [phone] till 3 pm If no answer page [phone] 01/07/2016, 2:44 PM   Attending Note:  I have examined patient, reviewed labs, studies and notes. I have discussed the case with S Minor, and I agree with the data and plans as amended above. 82 y.o. man with ischemic cardiomyopathy s/p ICD, chronic combined CHF (EF 20-25%), hypertension, hyperlipidemia, CAD s/p PCA and MI, chronic RBBB, prolonged QTc, chronic pericardial effusion, aortic stenosis, PVD, [name] Stage 3, insulin dependent Type 2 DM, chronic anemia, TIA, and chronic low back pain. He resides at assisted living and spoke with his sister on the day PTA. This am he was found unresponsive, initial rhythm was bradycardia devolving to asystole. He received 15 minutes CPR and was brought to [facility] ED. He had spontaneous movement but would not follow commands. interrogation of AICD revealed that he had VF that was shocked before the CPR event. Labs reveal acute renal failure with hyperkalemia. I suspect that this is sub-acute given the elevation in S Cr, and the  cardiac event may have been precipitated by renal failure. On my evaluation he is intubated, opens eyes to voice and stim, does not follow any commands or have purposeful movement. He is hypotensive, starting on pressors. His ECG does not indicate STEMI. We will admit to ICU for supportive care, initiate normothermia protocol. Hopefully his renal failure will respond to adequate perfusion, holding ACE-i. Will discuss with renal service. No abx for now.  i have discussed his status with his sisters at bedside, explained that he may not survive this event but that we will work to achieve stability so that we can assess his MS post-resuscitation. They voiced understanding. i have also recommended that he should not undergo further CPR as he would be unlikely to survive. Again they understand. They would like to discuss this information with their brother, then revisit code status and goals for his care.  Independent critical care time is 60 minutes.   Baltazar Apo, MD, PhD 01/07/2016, 4:46 PM [facility] [phone] or if no answer [phone]

## 2016-01-07 NOTE — ED Notes (Signed)
Dr. Taylor at bedside.

## 2016-01-07 NOTE — Code Documentation (Signed)
Pt was intubated with ETT with EMS. Pt received 3 epi and 12.5mg  of d50 for cbg of 67. Pt resonded to pain with EMS. Bilateral breath sounds upon arrival. pts LSN unknown.

## 2016-01-08 ENCOUNTER — Inpatient Hospital Stay (HOSPITAL_COMMUNITY): Payer: Commercial Managed Care - HMO

## 2016-01-08 DIAGNOSIS — N179 Acute kidney failure, unspecified: Secondary | ICD-10-CM

## 2016-01-08 LAB — GLUCOSE, CAPILLARY
GLUCOSE-CAPILLARY: 114 mg/dL — AB (ref 65–99)
GLUCOSE-CAPILLARY: 118 mg/dL — AB (ref 65–99)
GLUCOSE-CAPILLARY: 125 mg/dL — AB (ref 65–99)
GLUCOSE-CAPILLARY: 58 mg/dL — AB (ref 65–99)
GLUCOSE-CAPILLARY: 66 mg/dL (ref 65–99)
GLUCOSE-CAPILLARY: 91 mg/dL (ref 65–99)
Glucose-Capillary: 56 mg/dL — ABNORMAL LOW (ref 65–99)
Glucose-Capillary: 79 mg/dL (ref 65–99)
Glucose-Capillary: 80 mg/dL (ref 65–99)

## 2016-01-08 LAB — BASIC METABOLIC PANEL
Anion gap: 8 (ref 5–15)
BUN: 68 mg/dL — AB (ref 6–20)
CALCIUM: 9.8 mg/dL (ref 8.9–10.3)
CO2: 27 mmol/L (ref 22–32)
CREATININE: 4.21 mg/dL — AB (ref 0.61–1.24)
Chloride: 104 mmol/L (ref 101–111)
GFR calc Af Amer: 14 mL/min — ABNORMAL LOW (ref >60)
GFR, EST NON AFRICAN AMERICAN: 12 mL/min — AB (ref >60)
GLUCOSE: 114 mg/dL — AB (ref 65–99)
Potassium: 5.3 mmol/L — ABNORMAL HIGH (ref 3.5–5.1)
SODIUM: 139 mmol/L (ref 135–145)

## 2016-01-08 LAB — CBC
HEMATOCRIT: 32.9 % — AB (ref 39.0–52.0)
Hemoglobin: 10.7 g/dL — ABNORMAL LOW (ref 13.0–17.0)
MCH: 28.7 pg (ref 26.0–34.0)
MCHC: 32.5 g/dL (ref 30.0–36.0)
MCV: 88.2 fL (ref 78.0–100.0)
PLATELETS: 202 10*3/uL (ref 150–400)
RBC: 3.73 MIL/uL — ABNORMAL LOW (ref 4.22–5.81)
RDW: 14.6 % (ref 11.5–15.5)
WBC: 11.4 10*3/uL — ABNORMAL HIGH (ref 4.0–10.5)

## 2016-01-08 LAB — MAGNESIUM: MAGNESIUM: 2.2 mg/dL (ref 1.7–2.4)

## 2016-01-08 LAB — TROPONIN I
TROPONIN I: 0.18 ng/mL — AB (ref <0.031)
TROPONIN I: 0.2 ng/mL — AB (ref <0.031)

## 2016-01-08 LAB — PHOSPHORUS: PHOSPHORUS: 2.6 mg/dL (ref 2.5–4.6)

## 2016-01-08 MED ORDER — DEXTROSE 50 % IV SOLN
INTRAVENOUS | Status: AC
  Filled 2016-01-08: qty 50

## 2016-01-08 MED ORDER — DEXTROSE 10 % IV SOLN
INTRAVENOUS | Status: DC
  Administered 2016-01-08 – 2016-01-10 (×3): via INTRAVENOUS

## 2016-01-08 MED ORDER — DEXTROSE 50 % IV SOLN
1.0000 | Freq: Once | INTRAVENOUS | Status: AC
  Administered 2016-01-08: 50 mL via INTRAVENOUS
  Filled 2016-01-08: qty 50

## 2016-01-08 MED ORDER — DEXTROSE 50 % IV SOLN
INTRAVENOUS | Status: AC
  Administered 2016-01-08: 50 mL
  Filled 2016-01-08: qty 50

## 2016-01-08 MED ORDER — DEXTROSE 50 % IV SOLN
1.0000 | Freq: Once | INTRAVENOUS | Status: AC
  Administered 2016-01-08: 50 mL via INTRAVENOUS

## 2016-01-08 NOTE — Progress Notes (Signed)
eLink Physician-Brief Progress Note Patient Name: [name] DOB: [birth year] MRN: [id number]   Date of Service  01/08/2016  HPI/Events of Note  Hypoglycemia with blood sugars in the 72s to 10s.  On a bicarb gtt with D5W base.  eICU Interventions  Will increase bicarb gtt to 75 cc/hr.  If patient continues to remain hypoglycemia will add D10 rather than continue to increase bicarb gtt     Intervention Category Intermediate Interventions: Other:  Eli Pattillo 01/08/2016, 12:35 AM

## 2016-01-08 NOTE — Progress Notes (Signed)
PULMONARY / CRITICAL CARE MEDICINE   Name: [name] MRN: [id number] DOB: 01-03-1931    ADMISSION DATE:  01/07/2016   REFERRING MD: EDP  CHIEF COMPLAINT:  V fib arrest  HISTORY OF PRESENT ILLNESS:   [name] , long term [place] cardiology pt who has known CHF and has AICD in place who was in his usual state of health and at his [facility] when he was found with bradycardia and agonal breathing. He was coded x 15 minutes with CPR. Noted by AICD interrogation he had a Vfib event and AICD delivered shock prior to CPR. He was placed on normothermic protocol.    SUBJECTIVE:  [name] Remains critically ill On low dose levo gtt   VITAL SIGNS: BP 106/83 mmHg  Pulse 84  Temp(Src) 98 F (36.7 C) (Oral)  Resp 14  Ht 5\' 7"  (1.702 m)  Wt 73.7 kg (162 lb 7.7 oz)  BMI 25.44 kg/m2  SpO2 98%  HEMODYNAMICS:    VENTILATOR SETTINGS: Vent Mode:  [-] PRVC FiO2 (%):  [40 %-50 %] 40 % Set Rate:  [14 bmp] 14 bmp Vt Set:  [550 mL] 550 mL PEEP:  [5 cmH20] 5 cmH20 Pressure Support:  [10 cmH20] 10 cmH20 Plateau Pressure:  [20 [id number] cmH20] 24 cmH20  INTAKE / OUTPUT: I/O last 3 completed shifts: In: 2712.5 [I.V.:2712.5] Out: 1400 [Urine:1250; Emesis/NG output:150]  PHYSICAL EXAMINATION: General:  [name], on v ent  Neuro:  follows commands int opens eyes  HEENT:  No j\vd noted Cardiovascular: HSR RRR Lungs:  Coarse rhonchi Abdomen: + bs bilateral Musculoskeletal:  intact Skin: chronic lower ext skin chan ges  LABS:  BMET  Recent Labs Lab 01/07/16 1303 01/07/16 1800 01/08/16 0530  NA 137 132* 139  K 6.3* 5.2* 5.3*  CL 106 95* 104  CO2 20* 28 27  BUN 83* 68* 68*  CREATININE 5.40* 4.57* 4.21*  GLUCOSE 72 341*  189* 114*    Electrolytes  Recent Labs Lab 01/07/16 1303 01/07/16 1800 01/08/16 0530  CALCIUM 9.1 9.7 9.8  MG  --   --  2.2  PHOS  --   --  2.6    CBC  Recent Labs Lab 01/07/16 1220 01/08/16 0530  WBC 12.0* 11.4*  HGB  12.0*  14.3 10.7*  HCT 37.3*  42.0 32.9*  PLT 234 202    Coag's  Recent Labs Lab 01/07/16 1220 01/07/16 1800  APTT  --  37  INR 1.42 1.32    Sepsis Markers  Recent Labs Lab 01/07/16 1221  LATICACIDVEN 2.99*    ABG  Recent Labs Lab 01/07/16 1216  PHART 7.292*  PCO2ART 50.8*  PO2ART 245.0*    Liver Enzymes No results for input(s): AST, ALT, ALKPHOS, BILITOT, ALBUMIN in the last 168 hours.  Cardiac Enzymes  Recent Labs Lab 01/07/16 1800 01/07/16 2355 01/08/16 0530  TROPONINI 0.15* 0.20* 0.18*    Glucose  Recent Labs Lab 01/08/16 0400 01/08/16 0438 01/08/16 0746 01/08/16 0809 01/08/16 1121 01/08/16 1633  GLUCAP 56* 118* 58* 125* 79 80    Imaging Dg Chest Port 1 View  01/08/2016  CLINICAL DATA:  82 year old male with acute respiratory failure EXAM: PORTABLE CHEST 1 VIEW COMPARISON:  Prior chest x-ray 01/07/2016 FINDINGS: The endotracheal tube is 2.4 cm above the carina. A nasogastric tube is present. The tip is coiled in the gastric fundus. Left subclavian approach biventricular cardiac rhythm maintenance device. Leads project over the right atrium, right ventricle and overlying the  left ventricle. External defibrillator pads project over the chest. Stable cardiomegaly with left heart enlargement. Metallic stent projects over the coronary artery. No pneumothorax, pleural effusion or pulmonary edema. Persistently low inspiratory volumes. IMPRESSION: 1. Interval placement of a nasogastric tube which is coiled in the gastric fundus. 2. Otherwise, stable and satisfactory support apparatus. 3. Persistent low inspiratory volumes with perhaps mild bibasilar atelectasis. 4. Cardiomegaly without evidence of failure. Electronically Signed   By: Jacqulynn Cadet M.D.   On: 01/08/2016 07:11   Dg Abd Portable 1v  01/07/2016  CLINICAL DATA:  Patient status post OG tube placement. EXAM: PORTABLE ABDOMEN - 1 VIEW COMPARISON:  Abdominal radiograph 01/07/2016 FINDINGS:  Enteric tube tip and side-port project over the stomach. Nonobstructed bowel gas pattern. Lumbar spine degenerative changes. IMPRESSION: Enteric tube tip and side-port project over the stomach. Electronically Signed   By: Lovey Newcomer M.D.   On: 01/07/2016 20:28   Dg Abd Portable 1v  01/07/2016  CLINICAL DATA:  Feeding tube placement EXAM: PORTABLE ABDOMEN - 1 VIEW COMPARISON:  None. FINDINGS: Feeding tube appears coiled in the esophagus. Tip not seen. Bowel gas pattern normal. IMPRESSION: Feeding tube coiled in esophagus with tip not seen. Bowel gas pattern normal. These results will be called to the ordering clinician or representative by the Radiologist Assistant, and communication documented in the PACS or zVision Dashboard. Electronically Signed   By: Lowella Grip III M.D.   On: 01/07/2016 18:53     STUDIES:  Echo   >>  CULTURES:   ANTIBIOTICS:   SIGNIFICANT EVENTS: 4/14 vfib event leading to resp failure along with MODS  LINES/TUBES: Left biventricular AICD  DISCUSSION: Post arrest  ASSESSMENT / PLAN:  PULMONARY A: VDRF post v fib arrest CHF IACD P:   SBTs  BD's  CARDIOVASCULAR A:  Hx of IACD implantation  CHF Post V fib event leading to cardiac arrest HTN P:  Normothermic temperature inductikon Taper levo to off   RENAL Lab Results  Component Value Date   CREATININE 4.21* 01/08/2016   CREATININE 4.57* 01/07/2016   CREATININE 5.40* 01/07/2016   Base creatine 2.03 2/17 A:   Acute renal failure on [name], on lisinopril P:   Follow creatine  renal consulting Good UO, hence optimistic Ct bicarb gtt  GASTROINTESTINAL A:   GI protection P:   PPI Start TFs  HEMATOLOGIC A:   No acute issue P:    INFECTIOUS A:   Possible aspiration during  cpr Elevated wbc 12 P:   Add Aspiration coverage if fever  ENDOCRINE A:   DM P:   SSI q 4h   NEUROLOGIC A:  Acute encephalopathy  post cardiac arrest(15 min CPR) Head CT neg P:   RASS  goal:0 fent gtt    FAMILY  - Updates: Updated at bedside by RB --They would like to discuss this information with their brother, then revisit code status and goals for his care.    - Inter-disciplinary family meet or Palliative Care meeting due by:  day 7  Summary - Improving neuro status post arrest, hoping for renal recovery here  The patient is critically ill with multiple organ systems failure and requires high complexity decision making for assessment and support, frequent evaluation and titration of therapies, application of advanced monitoring technologies and extensive interpretation of multiple databases. Critical Care Time devoted to patient care services described in this note independent of APP time is 35 minutes.    Kara Mead MD. [facility] Flood. [facility]  Pager [phone] If no response [phone]   01/08/2016     01/08/2016, 6:40 PM

## 2016-01-08 NOTE — Progress Notes (Signed)
eLink Physician-Brief Progress Note Patient Name: KAIYAN LAING DOB: 1931/08/12 MRN: [id number]   Date of Service  01/08/2016  HPI/Events of Note  Persistent hypoglycemia  eICU Interventions  D10 infusion at 20 cc/hr initiated.     Intervention Category Intermediate Interventions: Other:  DETERDING,ELIZABETH 01/08/2016, 4:07 AM

## 2016-01-08 NOTE — Progress Notes (Signed)
Wasted 200cc fentanyl down sink,witness [employer].

## 2016-01-08 NOTE — Progress Notes (Signed)
Initial Nutrition Assessment  DOCUMENTATION CODES:  Not applicable  INTERVENTION:  If unable to extubate < 24 hrs. Recommend initiation of TF.  Vital AF 1.2 @ 20 ml/hr via OG/NGT and increase by 10 ml every 4 hours to goal rate of 50 ml/hr.   30 ml Prostat q 24 hrs.    Tube feeding regimen provides 1540 kcal (98% of needs), 105 grams of protein, and 973 ml of H2O.   NUTRITION DIAGNOSIS:  Inadequate oral intake related to inability to eat as evidenced by NPO status.  GOAL:  Patient will meet greater than or equal to 90% of their needs  MONITOR:  Vent status, Labs, I & O's, Diet advancement  REASON FOR ASSESSMENT: Ventilator    ASSESSMENT:  82 y/o male PMHx HLD, HTN, CAD, PVD, [name] 3, DM2, CHF w/ aicd in place. Presented from ALF w/ bradycardia and agonal breathing. Coded x 15 min with CPR. VDRF post v fib arrest. AKi in setting of cardiac arrest.   Pt intubated. Opens eyes and attempts to speak, but can not answer questions.   Patient is currently intubated on ventilator support MV: 9.7 L/min Temp (24hrs), Avg:97.8 F (36.6 C), Min:97.3 F (36.3 C), Max:98.2 F (36.8 C)  Propofol: None  Pt measured by RN as 5\' 7"    Labs reviewed: WBC: 11.4, anemic.    Recent Labs Lab 01/07/16 1303 01/07/16 1800 01/08/16 0530  NA 137 132* 139  K 6.3* 5.2* 5.3*  CL 106 95* 104  CO2 20* 28 27  BUN 83* 68* 68*  CREATININE 5.40* 4.57* 4.21*  CALCIUM 9.1 9.7 9.8  MG  --   --  2.2  PHOS  --   --  2.6  GLUCOSE 72 341*  189* 114*   Diet Order:  Diet NPO time specified  Skin: Ulceration to leg  Last BM:  Unknown  Height:  Ht Readings from Last 1 Encounters:  01/08/16 6' (1.829 m)  Pt measured today at bedside to be 5\' 7"   (170.18)  Weight:  Wt Readings from Last 1 Encounters:  01/07/16 162 lb 7.7 oz (73.7 kg)   Ideal Body Weight:  67.27 kg   BMI:  Body mass index is 25.4 kg/(m^2).  Estimated Nutritional Needs:  Kcal:  1575 Protein:  103-118 g (1.4-1.6 g/kg  bw) Fluid:  Per MD  EDUCATION NEEDS:  No education needs identified at this time  Burtis Junes RD, LDN Clinical Nutrition Pager: [phone] 01/08/2016 3:49 PM

## 2016-01-08 NOTE — Progress Notes (Signed)
Subjective:  BP soft overnight and also some hypoglycemia but good UOP and creatinine down - remains on vent Objective Vital signs in last 24 hours: Filed Vitals:   01/08/16 0324 01/08/16 0400 01/08/16 0500 01/08/16 0600  BP: 120/63 84/54 95/55 97/68  Pulse: 62 60 61 63  Temp:  98.2 F (36.8 C)    TempSrc:  Oral    Resp: 20 0 14 17  Weight:      SpO2: 100% 100% 100% 100%   Weight change:   Intake/Output Summary (Last 24 hours) at 01/08/16 0758 Last data filed at 01/08/16 4196  Gross per 24 hour  Intake 2712.5 ml  Output   1400 ml  Net 1312.5 ml    Assessment/Plan: 82 year old black male with an extensive cardiac history - recent acute kidney injury and also [name] with creatinine of 2 at the end of February. He now presents with acute on chronic renal failure in the setting of a cardiac arrest on lisinopril  1.Renal- acute on chronic kidney injury in the setting of a cardiac arrest and also on lisinopril. I suspect is secondary to ATN. The good news is that right now he is making urine. My feeling is that if his hemodynamics can be maintained and he continues to make urine I have a pretty good feeling about continued renal recovery.  2. Hyperkalemia - in the setting of ATN/AKI and lisinopril therapy. Obviously, lisinopril is on hold. He has received calcium, insulin, dextrose and is on a sodium bicarbonate drip. In addition, he is making urine. potassium  trending in the correct direction. 3. Anemia - is not a major issue right now 4. HTN/vol- is not wet-- getting fluids at 75 per hour- OK to continue- BP is soft possibly due to sedation vs cardiomyopathy 5. Hypoglycemia- getting sugar- per CCM   Pecolia Marando A    Labs: Basic Metabolic Panel:  Recent Labs Lab 01/07/16 1303 01/07/16 1800 01/08/16 0530  NA 137 132* 139  K 6.3* 5.2* 5.3*  CL 106 95* 104  CO2 20* 28 27  GLUCOSE 72 341*  189* 114*  BUN 83* 68* 68*  CREATININE 5.40* 4.57* 4.21*  CALCIUM 9.1 9.7  9.8  PHOS  --   --  2.6   Liver Function Tests: No results for input(s): AST, ALT, ALKPHOS, BILITOT, PROT, ALBUMIN in the last 168 hours. No results for input(s): LIPASE, AMYLASE in the last 168 hours. No results for input(s): AMMONIA in the last 168 hours. CBC:  Recent Labs Lab 01/07/16 1220 01/08/16 0530  WBC 12.0* 11.4*  HGB 12.0*  14.3 10.7*  HCT 37.3*  42.0 32.9*  MCV 88.8 88.2  PLT 234 202   Cardiac Enzymes:  Recent Labs Lab 01/07/16 1800 01/07/16 2355 01/08/16 0530  TROPONINI 0.15* 0.20* 0.18*   CBG:  Recent Labs Lab 01/07/16 2117 01/08/16 0002 01/08/16 0104 01/08/16 0400 01/08/16 0438  GLUCAP 131* 66 114* 56* 118*    Iron Studies: No results for input(s): IRON, TIBC, TRANSFERRIN, FERRITIN in the last 72 hours. Studies/Results: Ct Head Wo Contrast  01/07/2016  CLINICAL DATA:  Cardiac arrest. EXAM: CT HEAD WITHOUT CONTRAST TECHNIQUE: Contiguous axial images were obtained from the base of the skull through the vertex without intravenous contrast. COMPARISON:  None. FINDINGS: Brain: No evidence of an acute infarct, acute hemorrhage, mass lesion, mass effect or hydrocephalus. Atrophy. Moderate. Moderate periventricular low attenuation. Apparent low-attenuation in the left basal ganglia (series 2, image 15), possibly due to volume averaging with the adjacent  ventricle. Vascular: No hyperdense vessel or unexpected calcification. Skull: Negative for fracture or focal lesion. Sinuses/Orbits: Scattered mucosal thickening in the paranasal sinuses. No air-fluid levels. Mastoid air cells are clear. Other: None. IMPRESSION: 1. No acute intracranial abnormality. 2. Atrophy and chronic microvascular white matter ischemic changes. Electronically Signed   By: [name] M.D.   On: 01/07/2016 17:10   Dg Chest Port 1 View  01/08/2016  CLINICAL DATA:  82 year old male with acute respiratory failure EXAM: PORTABLE CHEST 1 VIEW COMPARISON:  Prior chest x-ray 01/07/2016 FINDINGS:  The endotracheal tube is 2.4 cm above the carina. A nasogastric tube is present. The tip is coiled in the gastric fundus. Left subclavian approach biventricular cardiac rhythm maintenance device. Leads project over the right atrium, right ventricle and overlying the left ventricle. External defibrillator pads project over the chest. Stable cardiomegaly with left heart enlargement. Metallic stent projects over the coronary artery. No pneumothorax, pleural effusion or pulmonary edema. Persistently low inspiratory volumes. IMPRESSION: 1. Interval placement of a nasogastric tube which is coiled in the gastric fundus. 2. Otherwise, stable and satisfactory support apparatus. 3. Persistent low inspiratory volumes with perhaps mild bibasilar atelectasis. 4. Cardiomegaly without evidence of failure. Electronically Signed   By: Jacqulynn Cadet M.D.   On: 01/08/2016 07:11   Dg Chest Portable 1 View  01/07/2016  CLINICAL DATA:  Intubation after cardiac arrest EXAM: PORTABLE CHEST 1 VIEW COMPARISON:  None. FINDINGS: Endotracheal tube tip is halfway between the expected location of the carina and the clavicular heads. Cardiomegaly with biventricular ICD/ pacer and coronary stent. Negative aortic and hilar contours. Left basilar density is likely from the cardiomegaly. There is no edema, consolidation, effusion, or pneumothorax. Asymmetric density at the right apex is likely osseous. Anticipate follow-up radiographs. No visible rib fracture. IMPRESSION: 1. Unremarkable positioning of the endotracheal tube. 2. Cardiomegaly without failure.  Biventricular ICD/pacer. 3. Asymmetric density at the right apex is favored osseous. After convalescence recommend two-view study. Electronically Signed   By: Monte Fantasia M.D.   On: 01/07/2016 12:55   Dg Abd Portable 1v  01/07/2016  CLINICAL DATA:  Patient status post OG tube placement. EXAM: PORTABLE ABDOMEN - 1 VIEW COMPARISON:  Abdominal radiograph 01/07/2016 FINDINGS: Enteric  tube tip and side-port project over the stomach. Nonobstructed bowel gas pattern. Lumbar spine degenerative changes. IMPRESSION: Enteric tube tip and side-port project over the stomach. Electronically Signed   By: Lovey Newcomer M.D.   On: 01/07/2016 20:28   Dg Abd Portable 1v  01/07/2016  CLINICAL DATA:  Feeding tube placement EXAM: PORTABLE ABDOMEN - 1 VIEW COMPARISON:  None. FINDINGS: Feeding tube appears coiled in the esophagus. Tip not seen. Bowel gas pattern normal. IMPRESSION: Feeding tube coiled in esophagus with tip not seen. Bowel gas pattern normal. These results will be called to the ordering clinician or representative by the Radiologist Assistant, and communication documented in the PACS or zVision Dashboard. Electronically Signed   By: Lowella Grip III M.D.   On: 01/07/2016 18:53   Medications: Infusions: . sodium chloride 2,000 mL (01/07/16 1222)  . dextrose 20 mL/hr at 01/08/16 0511  . fentaNYL infusion INTRAVENOUS 250 mcg/hr (01/08/16 0054)  . norepinephrine (LEVOPHED) Adult infusion 5 mcg/min (01/08/16 0035)  .  sodium bicarbonate  infusion 1000 mL 75 mL/hr at 01/08/16 0524    Scheduled Medications: . antiseptic oral rinse  7 mL Mouth Rinse 10 times per day  . chlorhexidine gluconate (SAGE KIT)  15 mL Mouth Rinse BID  . heparin  5,000 Units Subcutaneous 3 times per day  . insulin aspart  0-15 Units Subcutaneous 6 times per day  . pantoprazole (PROTONIX) IV  40 mg Intravenous QHS    have reviewed scheduled and prn medications.  Physical Exam: General: trying to open eyes- mittens on - no commands- on sedation Heart: RRR Lungs: mostly clear Abdomen: soft, non tender Extremities: no peripheral edema    01/08/2016,7:58 AM  LOS: 1 day          

## 2016-01-08 NOTE — Progress Notes (Signed)
Patient ID: HUMPHREY GUERREIRO, male   DOB: 11-02-1930, 82 y.o.   MRN: [id number]    Patient Name: [name] Date of Encounter: 01/08/2016     Active Problems:   Cardiac arrest [facility])   Acute respiratory failure with hypoxemia (HCC)   Acute renal failure (ARF) ([place])    SUBJECTIVE  Remains intubated and sedated on pressors.  CURRENT MEDS . antiseptic oral rinse  7 mL Mouth Rinse 10 times per day  . chlorhexidine gluconate (SAGE KIT)  15 mL Mouth Rinse BID  . heparin  5,000 Units Subcutaneous 3 times per day  . insulin aspart  0-15 Units Subcutaneous 6 times per day  . pantoprazole (PROTONIX) IV  40 mg Intravenous QHS    OBJECTIVE  Filed Vitals:   01/08/16 0324 01/08/16 0400 01/08/16 0500 01/08/16 0600  BP: 1_0 97/68  Pulse: 62 60 61 63  Temp:  98.2 F (36.8 C)    TempSrc:  Oral    Resp: 20 0 14 17  Weight:      SpO2: 100% 100% 100% 100%    Intake/Output Summary (Last 24 hours) at 01/08/16 0747 Last data filed at 01/08/16 0618  Gross per 24 hour  Intake 2712.5 ml  Output   1400 ml  Net 1312.5 ml   Filed Weights   01/07/16 1216 01/07/16 1730  Weight: 150 lb (68.04 kg) 162 lb 7.7 oz (73.7 kg)    PHYSICAL EXAM  General: Pleasant, NAD. Neuro: Alert and oriented X 3. Moves all extremities spontaneously. Psych: Normal affect. HEENT:  Normal  Neck: Supple without bruits or JVD. Lungs:  Resp regular and unlabored, CTA. Heart: RRR no s3, s4, or murmurs. Abdomen: Soft, non-tender, non-distended, BS + x 4.  Extremities: No clubbing, cyanosis or edema. DP/PT/Radials 2+ and equal bilaterally.  Accessory Clinical Findings  CBC  Recent Labs  01/07/16 1220 01/08/16 0530  WBC 12.0* 11.4*  HGB 12.0*  14.3 10.7*  HCT 37.3*  42.0 32.9*  MCV 88.8 88.2  PLT 234 086   Basic Metabolic Panel  Recent Labs  01/07/16 1800 01/08/16 0530  NA 132* 139  K 5.2* 5.3*  CL 95* 104  CO2 28 27  GLUCOSE 341*  189* 114*  BUN 68* 68*  CREATININE  4.57* 4.21*  CALCIUM 9.7 9.8  MG  --  2.2  PHOS  --  2.6   Liver Function Tests No results for input(s): AST, ALT, ALKPHOS, BILITOT, PROT, ALBUMIN in the last 72 hours. No results for input(s): LIPASE, AMYLASE in the last 72 hours. Cardiac Enzymes  Recent Labs  01/07/16 1800 01/07/16 2355 01/08/16 0530  TROPONINI 0.15* 0.20* 0.18*   BNP Invalid input(s): POCBNP D-Dimer No results for input(s): DDIMER in the last 72 hours. Hemoglobin A1C No results for input(s): HGBA1C in the last 72 hours. Fasting Lipid Panel No results for input(s): CHOL, HDL, LDLCALC, TRIG, CHOLHDL, LDLDIRECT in the last 72 hours. Thyroid Function Tests No results for input(s): TSH, T4TOTAL, T3FREE, THYROIDAB in the last 72 hours.  Invalid input(s): FREET3  TELE  nsr with biv pacing   Radiology/Studies  Ct Head Wo Contrast  01/07/2016  CLINICAL DATA:  Cardiac arrest. EXAM: CT HEAD WITHOUT CONTRAST TECHNIQUE: Contiguous axial images were obtained from the base of the skull through the vertex without intravenous contrast. COMPARISON:  None. FINDINGS: Brain: No evidence of an acute infarct, acute hemorrhage, mass lesion, mass effect or hydrocephalus. Atrophy. Moderate. Moderate periventricular low attenuation. Apparent low-attenuation in the  left basal ganglia (series 2, image 15), possibly due to volume averaging with the adjacent ventricle. Vascular: No hyperdense vessel or unexpected calcification. Skull: Negative for fracture or focal lesion. Sinuses/Orbits: Scattered mucosal thickening in the paranasal sinuses. No air-fluid levels. Mastoid air cells are clear. Other: None. IMPRESSION: 1. No acute intracranial abnormality. 2. Atrophy and chronic microvascular white matter ischemic changes. Electronically Signed   By: [name] M.D.   On: 01/07/2016 17:10   Dg Chest Port 1 View  01/08/2016  CLINICAL DATA:  82 year old male with acute respiratory failure EXAM: PORTABLE CHEST 1 VIEW COMPARISON:  Prior  chest x-ray 01/07/2016 FINDINGS: The endotracheal tube is 2.4 cm above the carina. A nasogastric tube is present. The tip is coiled in the gastric fundus. Left subclavian approach biventricular cardiac rhythm maintenance device. Leads project over the right atrium, right ventricle and overlying the left ventricle. External defibrillator pads project over the chest. Stable cardiomegaly with left heart enlargement. Metallic stent projects over the coronary artery. No pneumothorax, pleural effusion or pulmonary edema. Persistently low inspiratory volumes. IMPRESSION: 1. Interval placement of a nasogastric tube which is coiled in the gastric fundus. 2. Otherwise, stable and satisfactory support apparatus. 3. Persistent low inspiratory volumes with perhaps mild bibasilar atelectasis. 4. Cardiomegaly without evidence of failure. Electronically Signed   By: Jacqulynn Cadet M.D.   On: 01/08/2016 07:11   Dg Chest Portable 1 View  01/07/2016  CLINICAL DATA:  Intubation after cardiac arrest EXAM: PORTABLE CHEST 1 VIEW COMPARISON:  None. FINDINGS: Endotracheal tube tip is halfway between the expected location of the carina and the clavicular heads. Cardiomegaly with biventricular ICD/ pacer and coronary stent. Negative aortic and hilar contours. Left basilar density is likely from the cardiomegaly. There is no edema, consolidation, effusion, or pneumothorax. Asymmetric density at the right apex is likely osseous. Anticipate follow-up radiographs. No visible rib fracture. IMPRESSION: 1. Unremarkable positioning of the endotracheal tube. 2. Cardiomegaly without failure.  Biventricular ICD/pacer. 3. Asymmetric density at the right apex is favored osseous. After convalescence recommend two-view study. Electronically Signed   By: Monte Fantasia M.D.   On: 01/07/2016 12:55   Dg Abd Portable 1v  01/07/2016  CLINICAL DATA:  Patient status post OG tube placement. EXAM: PORTABLE ABDOMEN - 1 VIEW COMPARISON:  Abdominal radiograph  01/07/2016 FINDINGS: Enteric tube tip and side-port project over the stomach. Nonobstructed bowel gas pattern. Lumbar spine degenerative changes. IMPRESSION: Enteric tube tip and side-port project over the stomach. Electronically Signed   By: Lovey Newcomer M.D.   On: 01/07/2016 20:28   Dg Abd Portable 1v  01/07/2016  CLINICAL DATA:  Feeding tube placement EXAM: PORTABLE ABDOMEN - 1 VIEW COMPARISON:  None. FINDINGS: Feeding tube appears coiled in the esophagus. Tip not seen. Bowel gas pattern normal. IMPRESSION: Feeding tube coiled in esophagus with tip not seen. Bowel gas pattern normal. These results will be called to the ordering clinician or representative by the Radiologist Assistant, and communication documented in the PACS or zVision Dashboard. Electronically Signed   By: Lowella Grip III M.D.   On: 01/07/2016 18:53    ASSESSMENT AND PLAN  1. VF arrest, s/p appropriate ICD shock - he has had no additional VF. Continue current medical care. Will hold off on amio unless he has more VF 2. Chronic systolic heart failure - optivol and CXR did not look like significant volume overload yesterday.  3. VDRF - he is tolerating mechanical ventilation. Note plans for possible extubation 4. CAD - his  troponin is minimally elevated. He has not had a heart cath in a decade! At this point would like to perform but kidney funciton is limiting. Will see what happens with renal failure. 5. Acute on chronic renal failure - his kidney function is improving. UOP ok.  Gregg Taylor,M.D.  01/08/2016 7:47 AM

## 2016-01-09 ENCOUNTER — Inpatient Hospital Stay (HOSPITAL_COMMUNITY): Payer: Commercial Managed Care - HMO

## 2016-01-09 DIAGNOSIS — I4901 Ventricular fibrillation: Principal | ICD-10-CM

## 2016-01-09 DIAGNOSIS — R079 Chest pain, unspecified: Secondary | ICD-10-CM

## 2016-01-09 LAB — GLUCOSE, CAPILLARY
GLUCOSE-CAPILLARY: 118 mg/dL — AB (ref 65–99)
GLUCOSE-CAPILLARY: 144 mg/dL — AB (ref 65–99)
GLUCOSE-CAPILLARY: 158 mg/dL — AB (ref 65–99)
Glucose-Capillary: 127 mg/dL — ABNORMAL HIGH (ref 65–99)
Glucose-Capillary: 149 mg/dL — ABNORMAL HIGH (ref 65–99)
Glucose-Capillary: 133 mg/dL — ABNORMAL HIGH (ref 65–99)

## 2016-01-09 LAB — RENAL FUNCTION PANEL
ALBUMIN: 2.7 g/dL — AB (ref 3.5–5.0)
Anion gap: 8 (ref 5–15)
BUN: 59 mg/dL — ABNORMAL HIGH (ref 6–20)
CALCIUM: 8.9 mg/dL (ref 8.9–10.3)
CO2: 29 mmol/L (ref 22–32)
CREATININE: 3.7 mg/dL — AB (ref 0.61–1.24)
Chloride: 96 mmol/L — ABNORMAL LOW (ref 101–111)
GFR, EST AFRICAN AMERICAN: 16 mL/min — AB (ref >60)
GFR, EST NON AFRICAN AMERICAN: 14 mL/min — AB (ref >60)
Glucose, Bld: 133 mg/dL — ABNORMAL HIGH (ref 65–99)
PHOSPHORUS: 3 mg/dL (ref 2.5–4.6)
Potassium: 5.2 mmol/L — ABNORMAL HIGH (ref 3.5–5.1)
SODIUM: 133 mmol/L — AB (ref 135–145)

## 2016-01-09 LAB — CBC
HCT: 36.1 % — ABNORMAL LOW (ref 39.0–52.0)
Hemoglobin: 11.8 g/dL — ABNORMAL LOW (ref 13.0–17.0)
MCH: 28.7 pg (ref 26.0–34.0)
MCHC: 32.7 g/dL (ref 30.0–36.0)
MCV: 87.8 fL (ref 78.0–100.0)
PLATELETS: 186 10*3/uL (ref 150–400)
RBC: 4.11 MIL/uL — AB (ref 4.22–5.81)
RDW: 14.5 % (ref 11.5–15.5)
WBC: 15.9 10*3/uL — ABNORMAL HIGH (ref 4.0–10.5)

## 2016-01-09 LAB — ECHOCARDIOGRAM COMPLETE
HEIGHTINCHES: 67 in
WEIGHTICAEL: 2599.66 [oz_av]

## 2016-01-09 MED ORDER — PERFLUTREN LIPID MICROSPHERE
1.0000 mL | INTRAVENOUS | Status: AC | PRN
  Administered 2016-01-09: 2 mL via INTRAVENOUS
  Filled 2016-01-09: qty 10

## 2016-01-09 MED ORDER — DOCUSATE SODIUM 100 MG PO CAPS
100.0000 mg | ORAL_CAPSULE | Freq: Two times a day (BID) | ORAL | Status: DC | PRN
  Administered 2016-01-09 – 2016-01-12 (×3): 100 mg via ORAL
  Filled 2016-01-09 (×3): qty 1

## 2016-01-09 MED ORDER — CETYLPYRIDINIUM CHLORIDE 0.05 % MT LIQD
7.0000 mL | Freq: Two times a day (BID) | OROMUCOSAL | Status: DC
  Administered 2016-01-09 – 2016-01-12 (×6): 7 mL via OROMUCOSAL

## 2016-01-09 MED ORDER — BISACODYL 10 MG RE SUPP
10.0000 mg | Freq: Every day | RECTAL | Status: DC | PRN
  Administered 2016-01-10: 10 mg via RECTAL
  Filled 2016-01-09: qty 1

## 2016-01-09 MED ORDER — PERFLUTREN LIPID MICROSPHERE
INTRAVENOUS | Status: AC
  Filled 2016-01-09: qty 10

## 2016-01-09 NOTE — Progress Notes (Signed)
eLink Physician-Brief Progress Note Patient Name: [name] DOB: March 20, 1931 MRN: [id number]   Date of Service  01/09/2016  HPI/Events of Note  Constipation.  eICU Interventions  Will order: 1. Colace PO PRN. 2. Dulcolax Suppository PR PRN.     Intervention Category Intermediate Interventions: Other:  Lysle Dingwall 01/09/2016, 5:12 PM

## 2016-01-09 NOTE — Progress Notes (Signed)
Subjective:  BP soft again overnight but good UOP and creatinine down - remains on vent- more alert Objective Vital signs in last 24 hours: Filed Vitals:   01/09/16 0300 01/09/16 0313 01/09/16 0400 01/09/16 0511  BP: 108/54 1_0  Pulse: 86 84 86 83  Temp:   98 F (36.7 C)   TempSrc:   Oral   Resp: _1 Height:      Weight:      SpO2: 98% 99% 100% 100%   Weight change:   Intake/Output Summary (Last 24 hours) at 01/09/16 6720 Last data filed at 01/09/16 0600  Gross per 24 hour  Intake 2772.8 ml  Output   1850 ml  Net  922.8 ml    Assessment/Plan: 82 year old black male with an extensive cardiac history - recent acute kidney injury and also [name] with creatinine of 2 at the end of February. He now presents with acute on chronic renal failure in the setting of a cardiac arrest on lisinopril  1.Renal- acute on chronic kidney injury in the setting of a cardiac arrest and also on lisinopril. I suspect is secondary to ATN. The good news is that right now he is making urine. My feeling is that if his hemodynamics can be maintained and he continues to make urine I have a pretty good feeling about continued renal recovery.  2. Hyperkalemia - in the setting of ATN/AKI and lisinopril therapy. Obviously, lisinopril is on hold. He has received calcium, insulin, dextrose and is on a sodium bicarbonate drip. In addition, he is making urine. potassium stalled but not at dangerous level- going to stop bicarb 3. Anemia - is not a major issue right now 4. HTN/vol- is not wet-- getting fluids now at 125 per hour-overall 4 liters positive-  would like to decrease- will stop bicarb drip- BP is soft possibly due to  cardiomyopathy 5. Hypoglycemia- getting sugar- per CCM   [name] A    Labs: Basic Metabolic Panel:  Recent Labs Lab 01/07/16 1800 01/08/16 0530 01/09/16 0245  NA 132* 139 133*  K 5.2* 5.3* 5.2*  CL 95* 104 96*  CO2 _2 GLUCOSE 341*  189*  114* 133*  BUN 68* 68* 59*  CREATININE 4.57* 4.21* 3.70*  CALCIUM 9.7 9.8 8.9  PHOS  --  2.6 3.0   Liver Function Tests:  Recent Labs Lab 01/09/16 0245  ALBUMIN 2.7*   No results for input(s): LIPASE, AMYLASE in the last 168 hours. No results for input(s): AMMONIA in the last 168 hours. CBC:  Recent Labs Lab 01/07/16 1220 01/08/16 0530 01/09/16 0245  WBC 12.0* 11.4* 15.9*  HGB 12.0*  14.3 10.7* 11.8*  HCT 37.3*  42.0 32.9* 36.1*  MCV 88.8 88.2 87.8  PLT 234 202 186   Cardiac Enzymes:  Recent Labs Lab 01/07/16 1800 01/07/16 2355 01/08/16 0530  TROPONINI 0.15* 0.20* 0.18*   CBG:  Recent Labs Lab 01/08/16 1121 01/08/16 1633 01/08/16 2024 01/09/16 0033 01/09/16 0508  GLUCAP 79 80 91 118* 127*    Iron Studies: No results for input(s): IRON, TIBC, TRANSFERRIN, FERRITIN in the last 72 hours. Studies/Results: Ct Head Wo Contrast  01/07/2016  CLINICAL DATA:  Cardiac arrest. EXAM: CT HEAD WITHOUT CONTRAST TECHNIQUE: Contiguous axial images were obtained from the base of the skull through the vertex without intravenous contrast. COMPARISON:  None. FINDINGS: Brain: No evidence of an acute infarct, acute hemorrhage, mass lesion, mass effect or hydrocephalus. Atrophy. Moderate. Moderate periventricular low  attenuation. Apparent low-attenuation in the left basal ganglia (series 2, image 15), possibly due to volume averaging with the adjacent ventricle. Vascular: No hyperdense vessel or unexpected calcification. Skull: Negative for fracture or focal lesion. Sinuses/Orbits: Scattered mucosal thickening in the paranasal sinuses. No air-fluid levels. Mastoid air cells are clear. Other: None. IMPRESSION: 1. No acute intracranial abnormality. 2. Atrophy and chronic microvascular white matter ischemic changes. Electronically Signed   By: [name] M.D.   On: 01/07/2016 17:10   Dg Chest Port 1 View  01/09/2016  CLINICAL DATA:  82 year old male with history of acute respiratory  failure. EXAM: PORTABLE CHEST 1 VIEW COMPARISON:  Chest x-ray 01/08/2016. FINDINGS: An endotracheal tube is in place with tip 3.7 cm above the carina. A nasogastric tube is seen extending into the stomach, however, the tip of the nasogastric tube extends below the lower margin of the image. Left-sided biventricular pacemaker/AICD with lead tips projecting over the expected location of the right atrium, right ventricular apex and overlying the left ventricle via the coronary sinus and coronary veins. Lung volumes are normal. Patchy opacities are noted throughout the mid to lower lungs bilaterally, which are new compared to the prior study. Probable small left pleural effusion. Mild cardiomegaly. Upper mediastinal contours are within normal limits. Atherosclerosis in the thoracic aorta. IMPRESSION: 1. Support apparatus, as above. 2. Interval development of patchy ill-defined opacities throughout the mid to lower lungs bilaterally, concerning for sequela of aspiration or developing infection. Some component of underlying atelectasis is also likely present. 3. Probable small left pleural effusion. 4. Mild cardiomegaly. Electronically Signed   By: Vinnie Langton M.D.   On: 01/09/2016 07:49   Dg Chest Port 1 View  01/08/2016  CLINICAL DATA:  82 year old male with acute respiratory failure EXAM: PORTABLE CHEST 1 VIEW COMPARISON:  Prior chest x-ray 01/07/2016 FINDINGS: The endotracheal tube is 2.4 cm above the carina. A nasogastric tube is present. The tip is coiled in the gastric fundus. Left subclavian approach biventricular cardiac rhythm maintenance device. Leads project over the right atrium, right ventricle and overlying the left ventricle. External defibrillator pads project over the chest. Stable cardiomegaly with left heart enlargement. Metallic stent projects over the coronary artery. No pneumothorax, pleural effusion or pulmonary edema. Persistently low inspiratory volumes. IMPRESSION: 1. Interval placement  of a nasogastric tube which is coiled in the gastric fundus. 2. Otherwise, stable and satisfactory support apparatus. 3. Persistent low inspiratory volumes with perhaps mild bibasilar atelectasis. 4. Cardiomegaly without evidence of failure. Electronically Signed   By: Jacqulynn Cadet M.D.   On: 01/08/2016 07:11   Dg Chest Portable 1 View  01/07/2016  CLINICAL DATA:  Intubation after cardiac arrest EXAM: PORTABLE CHEST 1 VIEW COMPARISON:  None. FINDINGS: Endotracheal tube tip is halfway between the expected location of the carina and the clavicular heads. Cardiomegaly with biventricular ICD/ pacer and coronary stent. Negative aortic and hilar contours. Left basilar density is likely from the cardiomegaly. There is no edema, consolidation, effusion, or pneumothorax. Asymmetric density at the right apex is likely osseous. Anticipate follow-up radiographs. No visible rib fracture. IMPRESSION: 1. Unremarkable positioning of the endotracheal tube. 2. Cardiomegaly without failure.  Biventricular ICD/pacer. 3. Asymmetric density at the right apex is favored osseous. After convalescence recommend two-view study. Electronically Signed   By: Monte Fantasia M.D.   On: 01/07/2016 12:55   Dg Abd Portable 1v  01/07/2016  CLINICAL DATA:  Patient status post OG tube placement. EXAM: PORTABLE ABDOMEN - 1 VIEW COMPARISON:  Abdominal  radiograph 01/07/2016 FINDINGS: Enteric tube tip and side-port project over the stomach. Nonobstructed bowel gas pattern. Lumbar spine degenerative changes. IMPRESSION: Enteric tube tip and side-port project over the stomach. Electronically Signed   By: Lovey Newcomer M.D.   On: 01/07/2016 20:28   Dg Abd Portable 1v  01/07/2016  CLINICAL DATA:  Feeding tube placement EXAM: PORTABLE ABDOMEN - 1 VIEW COMPARISON:  None. FINDINGS: Feeding tube appears coiled in the esophagus. Tip not seen. Bowel gas pattern normal. IMPRESSION: Feeding tube coiled in esophagus with tip not seen. Bowel gas pattern  normal. These results will be called to the ordering clinician or representative by the Radiologist Assistant, and communication documented in the PACS or zVision Dashboard. Electronically Signed   By: Lowella Grip III M.D.   On: 01/07/2016 18:53   Medications: Infusions: . dextrose 50 mL/hr at 01/09/16 0509  . fentaNYL infusion INTRAVENOUS Stopped (01/08/16 0815)  . norepinephrine (LEVOPHED) Adult infusion Stopped (01/08/16 1200)  .  sodium bicarbonate  infusion 1000 mL 75 mL/hr at 01/09/16 0520    Scheduled Medications: . antiseptic oral rinse  7 mL Mouth Rinse 10 times per day  . chlorhexidine gluconate (SAGE KIT)  15 mL Mouth Rinse BID  . heparin  5,000 Units Subcutaneous 3 times per day  . insulin aspart  0-15 Units Subcutaneous 6 times per day  . pantoprazole (PROTONIX) IV  40 mg Intravenous QHS    have reviewed scheduled and prn medications.  Physical Exam: General:more alert- following commands Heart: RRR Lungs: mostly clear Abdomen: soft, non tender Extremities: no peripheral edema    01/09/2016,8:12 AM  LOS: 2 days

## 2016-01-09 NOTE — Progress Notes (Signed)
Echocardiogram 2D Echocardiogram with Definity has been performed.  Tresa Res 01/09/2016, 12:41 PM

## 2016-01-09 NOTE — Progress Notes (Signed)
PULMONARY / CRITICAL CARE MEDICINE   Name: [name] MRN: [id number] DOB: [birth year]    ADMISSION DATE:  01/07/2016   REFERRING MD: EDP  CHIEF COMPLAINT:  V fib arrest  HISTORY OF PRESENT ILLNESS:   [name] , long term [place] cardiology pt who has known CHF and has AICD in place who was in his usual state of health and at his [facility] when he was found with bradycardia and agonal breathing. He was coded x 15 minutes with CPR. Noted by AICD interrogation he had a Vfib event and AICD delivered shock prior to CPR. He was placed on normothermic protocol.    SUBJECTIVE:  [name] Remains critically ill, intubated No obvious pain Off  levo gtt   VITAL SIGNS: BP 105/61 mmHg  Pulse 87  Temp(Src) 100.2 F (37.9 C) (Oral)  Resp 17  Ht 5\' 7"  (1.702 m)  Wt 162 lb 7.7 oz (73.7 kg)  BMI 25.44 kg/m2  SpO2 100%  HEMODYNAMICS:    VENTILATOR SETTINGS: Vent Mode:  [-] PSV;CPAP FiO2 (%):  [40 %] 40 % Set Rate:  [14 bmp] 14 bmp Vt Set:  [550 mL] 550 mL PEEP:  [5 cmH20] 5 cmH20 Pressure Support:  [5 cmH20] 5 cmH20 Plateau Pressure:  [19 cmH20-24 cmH20] 21 cmH20  INTAKE / OUTPUT: I/O last 3 completed shifts: In: 4664.4 [I.V.:4664.4] Out: 2650 [Urine:2500; Emesis/NG output:150]  PHYSICAL EXAMINATION: General:  [name], on v ent  Neuro:  follows commands, non focal grossly,opens eyes  HEENT:  No j\vd noted Cardiovascular: HSR RRR Lungs: clear BL Abdomen: + bs bilateral Musculoskeletal:  intact Skin: chronic lower ext skin chan ges  LABS:  BMET  Recent Labs Lab 01/07/16 1800 01/08/16 0530 01/09/16 0245  NA 132* 139 133*  K 5.2* 5.3* 5.2*  CL 95* 104 96*  CO2 28 27 29   BUN 68* 68* 59*  CREATININE 4.57* 4.21* 3.70*  GLUCOSE 341*  189* 114* 133*    Electrolytes  Recent Labs Lab 01/07/16 1800 01/08/16 0530 01/09/16 0245  CALCIUM 9.7 9.8 8.9  MG  --  2.2  --   PHOS  --  2.6 3.0    CBC  Recent Labs Lab 01/07/16 1220  01/08/16 0530 01/09/16 0245  WBC 12.0* 11.4* 15.9*  HGB 12.0*  14.3 10.7* 11.8*  HCT 37.3*  42.0 32.9* 36.1*  PLT 234 202 186    Coag's  Recent Labs Lab 01/07/16 1220 01/07/16 1800  APTT  --  37  INR 1.42 1.32    Sepsis Markers  Recent Labs Lab 01/07/16 1221  LATICACIDVEN 2.99*    ABG  Recent Labs Lab 01/07/16 1216  PHART 7.292*  PCO2ART 50.8*  PO2ART 245.0*    Liver Enzymes  Recent Labs Lab 01/09/16 0245  ALBUMIN 2.7*    Cardiac Enzymes  Recent Labs Lab 01/07/16 1800 01/07/16 2355 01/08/16 0530  TROPONINI 0.15* 0.20* 0.18*    Glucose  Recent Labs Lab 01/08/16 0809 01/08/16 1121 01/08/16 1633 01/08/16 2024 01/09/16 0033 01/09/16 0508  GLUCAP 125* 79 80 91 118* 127*    Imaging Dg Chest Port 1 View  01/09/2016  CLINICAL DATA:  82 year old male with history of acute respiratory failure. EXAM: PORTABLE CHEST 1 VIEW COMPARISON:  Chest x-ray 01/08/2016. FINDINGS: An endotracheal tube is in place with tip 3.7 cm above the carina. A nasogastric tube is seen extending into the stomach, however, the tip of the nasogastric tube extends below the lower margin of the image.  Left-sided biventricular pacemaker/AICD with lead tips projecting over the expected location of the right atrium, right ventricular apex and overlying the left ventricle via the coronary sinus and coronary veins. Lung volumes are normal. Patchy opacities are noted throughout the mid to lower lungs bilaterally, which are new compared to the prior study. Probable small left pleural effusion. Mild cardiomegaly. Upper mediastinal contours are within normal limits. Atherosclerosis in the thoracic aorta. IMPRESSION: 1. Support apparatus, as above. 2. Interval development of patchy ill-defined opacities throughout the mid to lower lungs bilaterally, concerning for sequela of aspiration or developing infection. Some component of underlying atelectasis is also likely present. 3. Probable small  left pleural effusion. 4. Mild cardiomegaly. Electronically Signed   By: [name] M.D.   On: 01/09/2016 07:49     STUDIES:  Echo   >>  CULTURES:   ANTIBIOTICS:   SIGNIFICANT EVENTS: 4/14 vfib event leading to resp failure along with MODS  LINES/TUBES: Left biventricular AICD  DISCUSSION: Post arrest  ASSESSMENT / PLAN:  PULMONARY A: VDRF post v fib arrest CHF IACD P:   SBTs with goal extubation, tolerates PS5/5 BD's  CARDIOVASCULAR A:  Hx of IACD implantation  CHF Post V fib event leading to cardiac arrest HTN P:  Normothermia protocol Pressors off   RENAL Lab Results  Component Value Date   CREATININE 3.70* 01/09/2016   CREATININE 4.21* 01/08/2016   CREATININE 4.57* 01/07/2016   Base creatine 2.03 2/17 A:   AKI on [name] 4, on lisinopril P:   Follow creatine  renal consulting Good UO, hence optimistic for renal recovery Ct bicarb gtt  GASTROINTESTINAL A:   GI protection P:   PPI Hold TFs  HEMATOLOGIC A:   No acute issue P:    INFECTIOUS A:   Possible aspiration during  cpr Elevated wbc 12/ low gr fever P:   Empiric unasyn  ENDOCRINE A:   DM P:   SSI q 4h   NEUROLOGIC A:  Acute encephalopathy  post cardiac arrest(15 min CPR) Head CT neg P:   RASS goal:0 fent int     FAMILY  - Updates: Updated at bedside by RB --They would like to discuss this information with their brother, then revisit code status and goals for his care.    - Inter-disciplinary family meet or Palliative Care meeting due by:  day 7  Summary - Improving neuro status post arrest, proced with extubation,  hoping for renal recovery here  The patient is critically ill with multiple organ systems failure and requires high complexity decision making for assessment and support, frequent evaluation and titration of therapies, application of advanced monitoring technologies and extensive interpretation of multiple databases. Critical Care Time devoted  to patient care services described in this note independent of APP time is 35 minutes.    Kara Mead MD. Shade Flood. [place] [facility] Pager [phone] If no response call [phone]   01/09/2016     01/09/2016, 9:03 AM

## 2016-01-09 NOTE — Procedures (Signed)
Extubation Procedure Note  Patient Details:   Name: [name] DOB: July 30, 1931 MRN: [id number]   Airway Documentation:     Evaluation  O2 sats: stable throughout Complications: No apparent complications Patient did tolerate procedure well. Bilateral Breath Sounds: Clear, Diminished   Yes   Patient extubated to 2L nasal cannula per MD order.  Positive cuff leak noted.  No evidence of stridor.  Patient able to speak post extubation.  Sats currently 99%.  Vitals are stable.  No complications noted.    Alphia Moh N 01/09/2016, 10:03 AM

## 2016-01-09 NOTE — Plan of Care (Signed)
Problem: Phase II Progression Outcomes Goal: Time pt extubated/weaned off vent Outcome: Completed/Met Date Met:  01/09/16 10am

## 2016-01-09 NOTE — Progress Notes (Signed)
Patient ID: [name], male   DOB: 10/23/1930, 82 y.o.   MRN: [id number]    Patient Name: [name] Date of Encounter: 01/09/2016     Active Problems:   Cardiac arrest [facility])   Acute respiratory failure with hypoxemia (HCC)   Acute renal failure (ARF) ([place])   AKI (acute kidney injury) ([name])    SUBJECTIVE  appers to be tolerating extubation nicely. Cannot give much history as to the events preceeding his arrest but did note that he had felt badly for a couple of weeks.  CURRENT MEDS . antiseptic oral rinse  7 mL Mouth Rinse 10 times per day  . chlorhexidine gluconate (SAGE KIT)  15 mL Mouth Rinse BID  . heparin  5,000 Units Subcutaneous 3 times per day  . insulin aspart  0-15 Units Subcutaneous 6 times per day  . pantoprazole (PROTONIX) IV  40 mg Intravenous QHS    OBJECTIVE  Filed Vitals:   01/09/16 0400 01/09/16 0511 01/09/16 0734 01/09/16 0800  BP: _0 105/61  Pulse: 86 83  87  Temp: 98 F (36.7 C)   100.2 F (37.9 C)  TempSrc: Oral   Oral  Resp: _1 Height:      Weight:      SpO2: 100% 100% 100% 100%    Intake/Output Summary (Last 24 hours) at 01/09/16 1034 Last data filed at 01/09/16 0800  Gross per 24 hour  Intake 2871.5 ml  Output   1840 ml  Net 1031.5 ml   Filed Weights   01/07/16 1216 01/07/16 1730  Weight: 150 lb (68.04 kg) 162 lb 7.7 oz (73.7 kg)    PHYSICAL EXAM  General: Pleasant, NAD. Neuro: Alert and oriented X 3. Moves all extremities spontaneously. Psych: Normal affect. HEENT:  Normal  Neck: Supple without bruits or JVD. Lungs:  Resp regular and unlabored, CTA. Heart: RRR no s3, s4, or murmurs. Abdomen: Soft, non-tender, non-distended, BS + x 4.  Extremities: No clubbing, cyanosis or edema. DP/PT/Radials 2+ and equal bilaterally.  Accessory Clinical Findings  CBC  Recent Labs  01/08/16 0530 01/09/16 0245  WBC 11.4* 15.9*  HGB 10.7* 11.8*  HCT 32.9* 36.1*  MCV 88.2 87.8  PLT 202 497   Basic  Metabolic Panel  Recent Labs  01/08/16 0530 01/09/16 0245  NA 139 133*  K 5.3* 5.2*  CL 104 96*  CO2 27 29  GLUCOSE 114* 133*  BUN 68* 59*  CREATININE 4.21* 3.70*  CALCIUM 9.8 8.9  MG 2.2  --   PHOS 2.6 3.0   Liver Function Tests  Recent Labs  01/09/16 0245  ALBUMIN 2.7*   No results for input(s): LIPASE, AMYLASE in the last 72 hours. Cardiac Enzymes  Recent Labs  01/07/16 1800 01/07/16 2355 01/08/16 0530  TROPONINI 0.15* 0.20* 0.18*   BNP Invalid input(s): POCBNP D-Dimer No results for input(s): DDIMER in the last 72 hours. Hemoglobin A1C No results for input(s): HGBA1C in the last 72 hours. Fasting Lipid Panel No results for input(s): CHOL, HDL, LDLCALC, TRIG, CHOLHDL, LDLDIRECT in the last 72 hours. Thyroid Function Tests No results for input(s): TSH, T4TOTAL, T3FREE, THYROIDAB in the last 72 hours.  Invalid input(s): FREET3  TELE  nsr with ventricular pacing  Radiology/Studies  Ct Head Wo Contrast  01/07/2016  CLINICAL DATA:  Cardiac arrest. EXAM: CT HEAD WITHOUT CONTRAST TECHNIQUE: Contiguous axial images were obtained from the base of the skull through the vertex without intravenous contrast. COMPARISON:  None. FINDINGS: Brain: No evidence of an acute infarct, acute hemorrhage, mass lesion, mass effect or hydrocephalus. Atrophy. Moderate. Moderate periventricular low attenuation. Apparent low-attenuation in the left basal ganglia (series 2, image 15), possibly due to volume averaging with the adjacent ventricle. Vascular: No hyperdense vessel or unexpected calcification. Skull: Negative for fracture or focal lesion. Sinuses/Orbits: Scattered mucosal thickening in the paranasal sinuses. No air-fluid levels. Mastoid air cells are clear. Other: None. IMPRESSION: 1. No acute intracranial abnormality. 2. Atrophy and chronic microvascular white matter ischemic changes. Electronically Signed   By: [name] M.D.   On: 01/07/2016 17:10   Dg Chest Port 1  View  01/09/2016  CLINICAL DATA:  82 year old male with history of acute respiratory failure. EXAM: PORTABLE CHEST 1 VIEW COMPARISON:  Chest x-ray 01/08/2016. FINDINGS: An endotracheal tube is in place with tip 3.7 cm above the carina. A nasogastric tube is seen extending into the stomach, however, the tip of the nasogastric tube extends below the lower margin of the image. Left-sided biventricular pacemaker/AICD with lead tips projecting over the expected location of the right atrium, right ventricular apex and overlying the left ventricle via the coronary sinus and coronary veins. Lung volumes are normal. Patchy opacities are noted throughout the mid to lower lungs bilaterally, which are new compared to the prior study. Probable small left pleural effusion. Mild cardiomegaly. Upper mediastinal contours are within normal limits. Atherosclerosis in the thoracic aorta. IMPRESSION: 1. Support apparatus, as above. 2. Interval development of patchy ill-defined opacities throughout the mid to lower lungs bilaterally, concerning for sequela of aspiration or developing infection. Some component of underlying atelectasis is also likely present. 3. Probable small left pleural effusion. 4. Mild cardiomegaly. Electronically Signed   By: Vinnie Langton M.D.   On: 01/09/2016 07:49   Dg Chest Port 1 View  01/08/2016  CLINICAL DATA:  82 year old male with acute respiratory failure EXAM: PORTABLE CHEST 1 VIEW COMPARISON:  Prior chest x-ray 01/07/2016 FINDINGS: The endotracheal tube is 2.4 cm above the carina. A nasogastric tube is present. The tip is coiled in the gastric fundus. Left subclavian approach biventricular cardiac rhythm maintenance device. Leads project over the right atrium, right ventricle and overlying the left ventricle. External defibrillator pads project over the chest. Stable cardiomegaly with left heart enlargement. Metallic stent projects over the coronary artery. No pneumothorax, pleural effusion or  pulmonary edema. Persistently low inspiratory volumes. IMPRESSION: 1. Interval placement of a nasogastric tube which is coiled in the gastric fundus. 2. Otherwise, stable and satisfactory support apparatus. 3. Persistent low inspiratory volumes with perhaps mild bibasilar atelectasis. 4. Cardiomegaly without evidence of failure. Electronically Signed   By: Jacqulynn Cadet M.D.   On: 01/08/2016 07:11   Dg Chest Portable 1 View  01/07/2016  CLINICAL DATA:  Intubation after cardiac arrest EXAM: PORTABLE CHEST 1 VIEW COMPARISON:  None. FINDINGS: Endotracheal tube tip is halfway between the expected location of the carina and the clavicular heads. Cardiomegaly with biventricular ICD/ pacer and coronary stent. Negative aortic and hilar contours. Left basilar density is likely from the cardiomegaly. There is no edema, consolidation, effusion, or pneumothorax. Asymmetric density at the right apex is likely osseous. Anticipate follow-up radiographs. No visible rib fracture. IMPRESSION: 1. Unremarkable positioning of the endotracheal tube. 2. Cardiomegaly without failure.  Biventricular ICD/pacer. 3. Asymmetric density at the right apex is favored osseous. After convalescence recommend two-view study. Electronically Signed   By: Monte Fantasia M.D.   On: 01/07/2016 12:55   Dg Abd Portable  1v  01/07/2016  CLINICAL DATA:  Patient status post OG tube placement. EXAM: PORTABLE ABDOMEN - 1 VIEW COMPARISON:  Abdominal radiograph 01/07/2016 FINDINGS: Enteric tube tip and side-port project over the stomach. Nonobstructed bowel gas pattern. Lumbar spine degenerative changes. IMPRESSION: Enteric tube tip and side-port project over the stomach. Electronically Signed   By: Lovey Newcomer M.D.   On: 01/07/2016 20:28   Dg Abd Portable 1v  01/07/2016  CLINICAL DATA:  Feeding tube placement EXAM: PORTABLE ABDOMEN - 1 VIEW COMPARISON:  None. FINDINGS: Feeding tube appears coiled in the esophagus. Tip not seen. Bowel gas pattern  normal. IMPRESSION: Feeding tube coiled in esophagus with tip not seen. Bowel gas pattern normal. These results will be called to the ordering clinician or representative by the Radiologist Assistant, and communication documented in the PACS or zVision Dashboard. Electronically Signed   By: Lowella Grip III M.D.   On: 01/07/2016 18:53    ASSESSMENT AND PLAN  1. VF arrest - he has had no recurrent ventricular arrhythmias. Will follow. 2. ICM - i suspect coronary ischemia is the cause of #1. However, our options are limited as his kidney function still poor. Will see how much better his creatinine improves. 3. Possible pneumonia - reviewed CXR. I'll defer treatment to Pulm/CCM 4. BiV ICD - his device was interogated on admit and is working normally.  Nataliah Hatlestad,M.D.  01/09/2016 10:34 AM

## 2016-01-10 DIAGNOSIS — K5901 Slow transit constipation: Secondary | ICD-10-CM

## 2016-01-10 LAB — GLUCOSE, CAPILLARY
GLUCOSE-CAPILLARY: 111 mg/dL — AB (ref 65–99)
GLUCOSE-CAPILLARY: 118 mg/dL — AB (ref 65–99)
GLUCOSE-CAPILLARY: 125 mg/dL — AB (ref 65–99)
GLUCOSE-CAPILLARY: 171 mg/dL — AB (ref 65–99)
Glucose-Capillary: 82 mg/dL (ref 65–99)
Glucose-Capillary: 150 mg/dL — ABNORMAL HIGH (ref 65–99)
Glucose-Capillary: 157 mg/dL — ABNORMAL HIGH (ref 65–99)

## 2016-01-10 LAB — RENAL FUNCTION PANEL
ANION GAP: 9 (ref 5–15)
Albumin: 2.6 g/dL — ABNORMAL LOW (ref 3.5–5.0)
BUN: 51 mg/dL — ABNORMAL HIGH (ref 6–20)
CHLORIDE: 96 mmol/L — AB (ref 101–111)
CO2: 29 mmol/L (ref 22–32)
Calcium: 8.9 mg/dL (ref 8.9–10.3)
Creatinine, Ser: 3.19 mg/dL — ABNORMAL HIGH (ref 0.61–1.24)
GFR calc Af Amer: 19 mL/min — ABNORMAL LOW (ref >60)
GFR calc non Af Amer: 17 mL/min — ABNORMAL LOW (ref >60)
Glucose, Bld: 79 mg/dL (ref 65–99)
PHOSPHORUS: 3.5 mg/dL (ref 2.5–4.6)
POTASSIUM: 4.6 mmol/L (ref 3.5–5.1)
SODIUM: 134 mmol/L — AB (ref 135–145)

## 2016-01-10 MED ORDER — ACETAMINOPHEN 325 MG PO TABS
650.0000 mg | ORAL_TABLET | ORAL | Status: DC | PRN
  Administered 2016-01-11 – 2016-01-12 (×2): 650 mg via ORAL
  Filled 2016-01-10 (×2): qty 2

## 2016-01-10 MED ORDER — ENSURE ENLIVE PO LIQD
237.0000 mL | ORAL | Status: DC
  Administered 2016-01-10 – 2016-01-13 (×4): 237 mL via ORAL

## 2016-01-10 MED ORDER — OXYCODONE HCL 5 MG PO TABS
5.0000 mg | ORAL_TABLET | Freq: Four times a day (QID) | ORAL | Status: DC | PRN
  Administered 2016-01-10 – 2016-01-11 (×3): 5 mg via ORAL
  Filled 2016-01-10 (×3): qty 1

## 2016-01-10 NOTE — Evaluation (Signed)
Physical Therapy Evaluation Patient Details Name: [name] MRN: [id number] DOB: [birth year] Today's Date: 01/10/2016   History of Present Illness  [name] , long term [place] cardiology pt who has known CHF and has AICD in place who was in his usual state of health and at his [facility] when he was found with bradycardia and agonal breathing. He was coded x 15 minutes with CPR. Noted by AICD interrogation he had a Vfib event and AICD delivered shock prior to CPR. Other PMHx-DM, CAD/MI, CHF, PVD    Clinical Impression  Pt admitted with above diagnosis. Patient with significant decline in function (required 2 person max assist for [facility] to bed). Anticipate his ALF will be unable to provide this level of care. Pt currently with functional limitations due to the deficits listed below (see PT Problem List).  Pt will benefit from skilled PT to increase their independence and safety with mobility to allow discharge to the venue listed below.       Follow Up Recommendations SNF;Supervision/Assistance - 24 hour    Equipment Recommendations  Other (comment) (TBA)    Recommendations for Other Services OT consult     Precautions / Restrictions Precautions Precautions: Fall      Mobility  Bed Mobility Overal bed mobility: Needs Assistance Bed Mobility: Supine to Sit     Supine to sit: Min assist        Transfers Overall transfer level: Needs assistance Equipment used: None Transfers: Sit to/from [facility] Sit to Stand: Min assist Stand pivot transfers: Max assist;+2 physical assistance;+2 safety/equipment       General transfer comment: pt climbing OOB to go to bathroom on arrival; assisted pt stand-pivot to his Left with min assist onto [facility]; as attempting to pivot to his Rt to return to bed (with +2 assist of RN), pt began pivoting away from the bed. As assisted to pivot towards the bed, pt barely sat on EOB and immediately laid on his side. Ultimately raised bed  toward ceiling to allow pt to turn prone with his torso on bed and bring his legs under him to then stand with +2 assist. Pt side stepped toward HOB, pivoted to sit.  Ambulation/Gait             General Gait Details: unable  Stairs            Wheelchair Mobility    Modified Rankin (Stroke Patients Only)       Balance Overall balance assessment: Needs assistance Sitting-balance support: No upper extremity supported;Feet supported Sitting balance-Leahy Scale: Fair     Standing balance support: Bilateral upper extremity supported Standing balance-Leahy Scale: Poor                               Pertinent Vitals/Pain HR 80s, SaO2 98% on room air throughout session  Pain Assessment: No/denies pain    Home Living Family/patient expects to be discharged to:: Assisted living               Home Equipment: Walker - 4 wheels;Bedside commode;Shower seat;Wheelchair - manual      Prior Function Level of Independence: Independent with assistive device(s)         Comments: ?accuracy; he reports doing his own shower and dressing; walks with rollator     Hand Dominance        Extremity/Trunk Assessment   Upper Extremity Assessment: Generalized weakness  Lower Extremity Assessment: Generalized weakness      Cervical / Trunk Assessment: Kyphotic  Communication   Communication: HOH  Cognition Arousal/Alertness: Awake/alert Behavior During Therapy: Impulsive Overall Cognitive Status: No family/caregiver present to determine baseline cognitive functioning Area of Impairment: Attention;Following commands;Safety/judgement;Problem solving;Awareness   Current Attention Level: Sustained   Following Commands: Follows one step commands consistently Safety/Judgement: Decreased awareness of safety;Decreased awareness of deficits Awareness: Intellectual Problem Solving: Requires verbal cues;Requires tactile cues;Difficulty  sequencing General Comments: on arrival; bedpan on floor; pt with legs off bed and trying to sit up     General Comments      Exercises        Assessment/Plan    PT Assessment Patient needs continued PT services  PT Diagnosis Generalized weakness;Difficulty walking   PT Problem List Decreased strength;Decreased activity tolerance;Decreased balance;Decreased mobility;Decreased cognition;Decreased knowledge of use of DME;Decreased safety awareness  PT Treatment Interventions DME instruction;Gait training;Functional mobility training;Therapeutic activities;Therapeutic exercise;Balance training;Cognitive remediation;Patient/family education   PT Goals (Current goals can be found in the Care Plan section) Acute Rehab PT Goals Patient Stated Goal: be able to walk "down the hall" again PT Goal Formulation: With patient Time For Goal Achievement: 01/24/16 Potential to Achieve Goals: Good    Frequency Min 3X/week   Barriers to discharge Decreased caregiver support      Co-evaluation               End of Session Equipment Utilized During Treatment: Gait belt Activity Tolerance: Patient limited by fatigue Patient left: in bed;with call bell/phone within reach;with bed alarm set;with nursing/sitter in room Nurse Communication: Mobility status;Other (comment) (found pt trying to climb OOB on arrival)         Time: 1335-1420 PT Time Calculation (min) (ACUTE ONLY): 45 min   Charges:   PT Evaluation $PT Eval Moderate Complexity: 1 Procedure PT Treatments $Therapeutic Activity: 23-37 mins   PT G Codes:        Elis Rawlinson 2016-01-25, 2:35 PM Pager [phone]

## 2016-01-10 NOTE — Care Management Important Message (Signed)
Important Message  Patient Details  Name: [name] MRN: [id number] Date of Birth: 1931-05-23   Medicare Important Message Given:  Yes    Edmonia Gonser P Odell Choung 01/10/2016, 1:03 PM

## 2016-01-10 NOTE — Clinical Documentation Improvement (Signed)
Critical Care  Would you please help clarify medical condition related to clinical findings?   Pneumonia  Other condition  Clinically Undetermined   Supporting Information: :  CXR on Interval development of patchy ill-defined opacities throughout the mid to lower lungs bilaterally, concerning for sequela of &aspiration or developing infection. Some component of underlying atelectasis is also likely present. 3. Probable small left pleural effusion. 4. Mild cardiomegaly.   Progress note from Dr. Cristopher Peru, MD  - Possible pneumonia - reviewed CXR. I'll defer treatment to Pulm/CCM     Please exercise your independent, professional judgment when responding. A specific answer is not anticipated or expected.   Thank You, [place] [phone]

## 2016-01-10 NOTE — Progress Notes (Signed)
Patient ID: [name], male   DOB: 12-07-1930, 82 y.o.   MRN: [id number]    Patient Name: [name] Date of Encounter: 01/10/2016     Active Problems:   Cardiac arrest [facility])   Acute respiratory failure with hypoxemia (HCC)   Acute renal failure (ARF) ([place])   AKI (acute kidney injury) ([place])    SUBJECTIVE  "I'm stopped up", denies chest pain or sob.   CURRENT MEDS . antiseptic oral rinse  7 mL Mouth Rinse BID  . insulin aspart  0-15 Units Subcutaneous 6 times per day    OBJECTIVE  Filed Vitals:   01/10/16 0900 01/10/16 1000 01/10/16 1100 01/10/16 1200  BP: 114/82 122/56 91/49 111/48  Pulse: 81 82 79 82  Temp:   98.8 F (37.1 C)   TempSrc:   Oral   Resp: 21 19 19 19   Height:      Weight:      SpO2: 98% 98% 97% 99%    Intake/Output Summary (Last 24 hours) at 01/10/16 1341 Last data filed at 01/10/16 1000  Gross per 24 hour  Intake    800 ml  Output   1460 ml  Net   -660 ml   Filed Weights   01/07/16 1216 01/07/16 1730  Weight: 150 lb (68.04 kg) 162 lb 7.7 oz (73.7 kg)    PHYSICAL EXAM  General: Pleasant, diskempt, NAD. Neuro: Alert and oriented X 3. Moves all extremities spontaneously. Psych: Normal affect. HEENT:  Normal  Neck: Supple without bruits or JVD. Lungs:  Resp regular and unlabored, CTA. Heart: RRR no s3, s4, or murmurs. Abdomen: Soft, non-tender, non-distended, BS + x 4.  Extremities: No clubbing, cyanosis or edema. DP/PT/Radials 2+ and equal bilaterally.  Accessory Clinical Findings  CBC  Recent Labs  01/08/16 0530 01/09/16 0245  WBC 11.4* 15.9*  HGB 10.7* 11.8*  HCT 32.9* 36.1*  MCV 88.2 87.8  PLT 202 [id number]   Basic Metabolic Panel  Recent Labs  01/08/16 0530 01/09/16 0245 01/10/16 0240  NA 139 133* 134*  K 5.3* 5.2* 4.6  CL 104 96* 96*  CO2 27 29 29   GLUCOSE 114* 133* 79  BUN 68* 59* 51*  CREATININE 4.21* 3.70* 3.19*  CALCIUM 9.8 8.9 8.9  MG 2.2  --   --   PHOS 2.6 3.0 3.5   Liver Function  Tests  Recent Labs  01/09/16 0245 01/10/16 0240  ALBUMIN 2.7* 2.6*   No results for input(s): LIPASE, AMYLASE in the last 72 hours. Cardiac Enzymes  Recent Labs  01/07/16 1800 01/07/16 2355 01/08/16 0530  TROPONINI 0.15* 0.20* 0.18*   BNP Invalid input(s): POCBNP D-Dimer No results for input(s): DDIMER in the last 72 hours. Hemoglobin A1C No results for input(s): HGBA1C in the last 72 hours. Fasting Lipid Panel No results for input(s): CHOL, HDL, LDLCALC, TRIG, CHOLHDL, LDLDIRECT in the last 72 hours. Thyroid Function Tests No results for input(s): TSH, T4TOTAL, T3FREE, THYROIDAB in the last 72 hours.  Invalid input(s): FREET3  TELE  nsr   Radiology/Studies  Ct Head Wo Contrast  01/07/2016  CLINICAL DATA:  Cardiac arrest. EXAM: CT HEAD WITHOUT CONTRAST TECHNIQUE: Contiguous axial images were obtained from the base of the skull through the vertex without intravenous contrast. COMPARISON:  None. FINDINGS: Brain: No evidence of an acute infarct, acute hemorrhage, mass lesion, mass effect or hydrocephalus. Atrophy. Moderate. Moderate periventricular low attenuation. Apparent low-attenuation in the left basal ganglia (series 2, image 15), possibly due to volume averaging  with the adjacent ventricle. Vascular: No hyperdense vessel or unexpected calcification. Skull: Negative for fracture or focal lesion. Sinuses/Orbits: Scattered mucosal thickening in the paranasal sinuses. No air-fluid levels. Mastoid air cells are clear. Other: None. IMPRESSION: 1. No acute intracranial abnormality. 2. Atrophy and chronic microvascular white matter ischemic changes. Electronically Signed   By: [name] M.D.   On: 01/07/2016 17:10   Dg Chest Port 1 View  01/09/2016  CLINICAL DATA:  82 year old male with history of acute respiratory failure. EXAM: PORTABLE CHEST 1 VIEW COMPARISON:  Chest x-ray 01/08/2016. FINDINGS: An endotracheal tube is in place with tip 3.7 cm above the carina. A  nasogastric tube is seen extending into the stomach, however, the tip of the nasogastric tube extends below the lower margin of the image. Left-sided biventricular pacemaker/AICD with lead tips projecting over the expected location of the right atrium, right ventricular apex and overlying the left ventricle via the coronary sinus and coronary veins. Lung volumes are normal. Patchy opacities are noted throughout the mid to lower lungs bilaterally, which are new compared to the prior study. Probable small left pleural effusion. Mild cardiomegaly. Upper mediastinal contours are within normal limits. Atherosclerosis in the thoracic aorta. IMPRESSION: 1. Support apparatus, as above. 2. Interval development of patchy ill-defined opacities throughout the mid to lower lungs bilaterally, concerning for sequela of aspiration or developing infection. Some component of underlying atelectasis is also likely present. 3. Probable small left pleural effusion. 4. Mild cardiomegaly. Electronically Signed   By: Vinnie Langton M.D.   On: 01/09/2016 07:49   Dg Chest Port 1 View  01/08/2016  CLINICAL DATA:  82 year old male with acute respiratory failure EXAM: PORTABLE CHEST 1 VIEW COMPARISON:  Prior chest x-ray 01/07/2016 FINDINGS: The endotracheal tube is 2.4 cm above the carina. A nasogastric tube is present. The tip is coiled in the gastric fundus. Left subclavian approach biventricular cardiac rhythm maintenance device. Leads project over the right atrium, right ventricle and overlying the left ventricle. External defibrillator pads project over the chest. Stable cardiomegaly with left heart enlargement. Metallic stent projects over the coronary artery. No pneumothorax, pleural effusion or pulmonary edema. Persistently low inspiratory volumes. IMPRESSION: 1. Interval placement of a nasogastric tube which is coiled in the gastric fundus. 2. Otherwise, stable and satisfactory support apparatus. 3. Persistent low inspiratory  volumes with perhaps mild bibasilar atelectasis. 4. Cardiomegaly without evidence of failure. Electronically Signed   By: Jacqulynn Cadet M.D.   On: 01/08/2016 07:11   Dg Chest Portable 1 View  01/07/2016  CLINICAL DATA:  Intubation after cardiac arrest EXAM: PORTABLE CHEST 1 VIEW COMPARISON:  None. FINDINGS: Endotracheal tube tip is halfway between the expected location of the carina and the clavicular heads. Cardiomegaly with biventricular ICD/ pacer and coronary stent. Negative aortic and hilar contours. Left basilar density is likely from the cardiomegaly. There is no edema, consolidation, effusion, or pneumothorax. Asymmetric density at the right apex is likely osseous. Anticipate follow-up radiographs. No visible rib fracture. IMPRESSION: 1. Unremarkable positioning of the endotracheal tube. 2. Cardiomegaly without failure.  Biventricular ICD/pacer. 3. Asymmetric density at the right apex is favored osseous. After convalescence recommend two-view study. Electronically Signed   By: Monte Fantasia M.D.   On: 01/07/2016 12:55   Dg Abd Portable 1v  01/07/2016  CLINICAL DATA:  Patient status post OG tube placement. EXAM: PORTABLE ABDOMEN - 1 VIEW COMPARISON:  Abdominal radiograph 01/07/2016 FINDINGS: Enteric tube tip and side-port project over the stomach. Nonobstructed bowel gas pattern. Lumbar  spine degenerative changes. IMPRESSION: Enteric tube tip and side-port project over the stomach. Electronically Signed   By: Lovey Newcomer M.D.   On: 01/07/2016 20:28   Dg Abd Portable 1v  01/07/2016  CLINICAL DATA:  Feeding tube placement EXAM: PORTABLE ABDOMEN - 1 VIEW COMPARISON:  None. FINDINGS: Feeding tube appears coiled in the esophagus. Tip not seen. Bowel gas pattern normal. IMPRESSION: Feeding tube coiled in esophagus with tip not seen. Bowel gas pattern normal. These results will be called to the ordering clinician or representative by the Radiologist Assistant, and communication documented in the PACS  or zVision Dashboard. Electronically Signed   By: Lowella Grip III M.D.   On: 01/07/2016 18:53    ASSESSMENT AND PLAN  1. VF arrest - he is free of additional arrhythmias. Will follow. 2. Acute on chronic renal failure - his creatinine is improving and his urine output is good. Will follow. 3. ishemic cardiomyopathy - he will need left heart cath if his kidney function improves. 4. Acute on chronic systolic heart failure - he appears euvolemic. Will follow.  Gregg Taylor,M.D.  01/10/2016 1:41 PM

## 2016-01-10 NOTE — Progress Notes (Signed)
Nutrition Follow-up  INTERVENTION:   Ensure Enlive po bedtime, each supplement provides 350 kcal and 20 grams of protein  NUTRITION DIAGNOSIS:   Inadequate oral intake related to acute illness as evidenced by meal completion < 50%. Ongoing.   GOAL:   Patient will meet greater than or equal to 90% of their needs Progressing  MONITOR:   PO intake, Supplement acceptance, Skin, I & O's  ASSESSMENT:   [name] , long term [place] cardiology pt who has known CHF and has AICD in place who was in his usual state of health and at his [facility] when he was found with bradycardia and agonal breathing. He was coded x 15 minutes with CPR. Noted by AICD interrogation he had a Vfib event and AICD delivered shock prior to CPR. He was placed on normothermic protocol.   Labs reviewed: sodium decreased 134, BUN/Cr elevated 51/3.19 CBG's: 82-171 Pt and RN states that he ate good this am. Loves milk.   Diet Order:  Diet heart healthy/carb modified Room service appropriate?: Yes; Fluid consistency:: Thin  Skin:  Wound (see comment) (right leg wound)  Last BM:  unknown  Height:   Ht Readings from Last 1 Encounters:  01/08/16 5\' 7"  (1.702 m)    Weight:   Wt Readings from Last 1 Encounters:  01/07/16 162 lb 7.7 oz (73.7 kg)    Ideal Body Weight:  67.27 kg  BMI:  Body mass index is 25.44 kg/(m^2).  Estimated Nutritional Needs:   Kcal:  1700-1900  Protein:  85-100 grams  Fluid:  > 1.7 L/day  EDUCATION NEEDS:   No education needs identified at this time  [place], [place], [place] Pager [phone] After Hours Pager

## 2016-01-10 NOTE — Progress Notes (Signed)
Subjective:  BP improving overnight (81-123/47-61).  Continues to have good UOP. and creatinine continues to trend down.  Off vent.  Reports that he is feeling all right.  Notes some chest pain.  Objective Vital signs in last 24 hours: Filed Vitals:   01/10/16 0500 01/10/16 0600 01/10/16 0700 01/10/16 0800  BP: 120/56 123/52 122/57 105/66  Pulse: 81 83 83 81  Temp:    98.9 F (37.2 C)  TempSrc:    Oral  Resp: 27 17 26 20   Height:      Weight:      SpO2: 99% 100% 99% 99%   Weight change:   Intake/Output Summary (Last 24 hours) at 01/10/16 0848 Last data filed at 01/10/16 0800  Gross per 24 hour  Intake   1018 ml  Output   1345 ml  Net   -327 ml   Physical Exam:  General: awake, alert, follows commands Heart: RRR Lungs: normal WOB on [place]. Intermittent transmission of sounds from upper airway Abdomen: soft, non tender Extremities: no peripheral edema  Labs: Basic Metabolic Panel:  Recent Labs Lab 01/08/16 0530 01/09/16 0245 01/10/16 0240  NA 139 133* 134*  K 5.3* 5.2* 4.6  CL 104 96* 96*  CO2 27 29 29   GLUCOSE 114* 133* 79  BUN 68* 59* 51*  CREATININE 4.21* 3.70* 3.19*  CALCIUM 9.8 8.9 8.9  PHOS 2.6 3.0 3.5   Liver Function Tests:  Recent Labs Lab 01/09/16 0245 01/10/16 0240  ALBUMIN 2.7* 2.6*   No results for input(s): LIPASE, AMYLASE in the last 168 hours. No results for input(s): AMMONIA in the last 168 hours. CBC:  Recent Labs Lab 01/07/16 1220 01/08/16 0530 01/09/16 0245  WBC 12.0* 11.4* 15.9*  HGB 12.0*  14.3 10.7* 11.8*  HCT 37.3*  42.0 32.9* 36.1*  MCV 88.8 88.2 87.8  PLT 234 202 186   Cardiac Enzymes:  Recent Labs Lab 01/07/16 1800 01/07/16 2355 01/08/16 0530  TROPONINI 0.15* 0.20* 0.18*   CBG:  Recent Labs Lab 01/09/16 1220 01/09/16 1550 01/09/16 2041 01/10/16 0004 01/10/16 0433  GLUCAP 133* 144* 158* 125* 82    Iron Studies: No results for input(s): IRON, TIBC, TRANSFERRIN, FERRITIN in the last 72  hours. Studies/Results: Dg Chest Port 1 View  01/09/2016  CLINICAL DATA:  82 year old male with history of acute respiratory failure. EXAM: PORTABLE CHEST 1 VIEW COMPARISON:  Chest x-ray 01/08/2016. FINDINGS: An endotracheal tube is in place with tip 3.7 cm above the carina. A nasogastric tube is seen extending into the stomach, however, the tip of the nasogastric tube extends below the lower margin of the image. Left-sided biventricular pacemaker/AICD with lead tips projecting over the expected location of the right atrium, right ventricular apex and overlying the left ventricle via the coronary sinus and coronary veins. Lung volumes are normal. Patchy opacities are noted throughout the mid to lower lungs bilaterally, which are new compared to the prior study. Probable small left pleural effusion. Mild cardiomegaly. Upper mediastinal contours are within normal limits. Atherosclerosis in the thoracic aorta. IMPRESSION: 1. Support apparatus, as above. 2. Interval development of patchy ill-defined opacities throughout the mid to lower lungs bilaterally, concerning for sequela of aspiration or developing infection. Some component of underlying atelectasis is also likely present. 3. Probable small left pleural effusion. 4. Mild cardiomegaly. Electronically Signed   By: Vinnie Langton M.D.   On: 01/09/2016 07:49   Medications: Infusions: . dextrose 30 mL/hr at 01/10/16 0800    Scheduled Medications: . antiseptic  oral rinse  7 mL Mouth Rinse BID  . heparin  5,000 Units Subcutaneous 3 times per day  . insulin aspart  0-15 Units Subcutaneous 6 times per day  . pantoprazole (PROTONIX) IV  40 mg Intravenous QHS    have reviewed scheduled and prn medications.  Assessment/Plan: 81 year old black male with an extensive cardiac history - recent acute kidney injury and also [name] with creatinine of 2 at the end of February. He now presents with acute on chronic renal failure in the setting of a cardiac arrest on  lisinopril  1.Renal- acute on chronic kidney injury in the setting of a cardiac arrest and also on lisinopril. Likely secondary to ATN. Continues to have good UOP.  Kidney function appears to be gradually recovering. 2. Hyperkalemia - in the setting of ATN/AKI and lisinopril therapy.  Lisinopril is on hold. Potassium normalized 4.6. 3. Anemia - is not a major issue right now 4. HTN/vol- is not wet-- getting D10 only now at 30 cc per hour-overall 4 liters positive 5. Hypoglycemia- D10 as above per CCM   Novia Lansberry, DO 01/10/2016,8:48 AM  LOS: 3 days

## 2016-01-10 NOTE — Progress Notes (Signed)
PULMONARY / CRITICAL CARE MEDICINE   Name: [name] MRN: [id number] DOB: 1931-01-05    ADMISSION DATE:  01/07/2016   REFERRING MD: EDP  CHIEF COMPLAINT:  V fib arrest  HISTORY OF PRESENT ILLNESS:   [name] , long term [place] cardiology pt who has known CHF and has AICD in place who was in his usual state of health and at his [facility] when he was found with bradycardia and agonal breathing. He was coded x 15 minutes with CPR. Noted by AICD interrogation he had a Vfib event and AICD delivered shock prior to CPR. He was placed on normothermic protocol.    SUBJECTIVE:  Extubated successfully Sluggish urine output Ate well Complains of chest soreness constipated   VITAL SIGNS: BP 105/66 mmHg  Pulse 81  Temp(Src) 98.9 F (37.2 C) (Oral)  Resp 20  Ht 5\' 7"  (1.702 m)  Wt 73.7 kg (162 lb 7.7 oz)  BMI 25.44 kg/m2  SpO2 99%  HEMODYNAMICS:    VENTILATOR SETTINGS:    INTAKE / OUTPUT: I/O last 3 completed shifts: In: 2583 [P.O.:320; I.V.:2263] Out: 2615 [Urine:2165; Emesis/NG output:450]  PHYSICAL EXAMINATION: General:  Lying comfortably in bed HENT: NCAT OP clear PULM: CTA B, normal effort CV: distant heart sounds GI: belly soft, infrequent bowel sounds Neuro: Awake, alert, answering questions  LABS:  BMET  Recent Labs Lab 01/08/16 0530 01/09/16 0245 01/10/16 0240  NA 139 133* 134*  K 5.3* 5.2* 4.6  CL 104 96* 96*  CO2 27 29 29   BUN 68* 59* 51*  CREATININE 4.21* 3.70* 3.19*  GLUCOSE 114* 133* 79    Electrolytes  Recent Labs Lab 01/08/16 0530 01/09/16 0245 01/10/16 0240  CALCIUM 9.8 8.9 8.9  MG 2.2  --   --   PHOS 2.6 3.0 3.5    CBC  Recent Labs Lab 01/07/16 1220 01/08/16 0530 01/09/16 0245  WBC 12.0* 11.4* 15.9*  HGB 12.0*  14.3 10.7* 11.8*  HCT 37.3*  42.0 32.9* 36.1*  PLT 234 202 186    Coag's  Recent Labs Lab 01/07/16 1220 01/07/16 1800  APTT  --  37  INR 1.42 1.32    Sepsis Markers  Recent Labs Lab  01/07/16 1221  LATICACIDVEN 2.99*    ABG  Recent Labs Lab 01/07/16 1216  PHART 7.292*  PCO2ART 50.8*  PO2ART 245.0*    Liver Enzymes  Recent Labs Lab 01/09/16 0245 01/10/16 0240  ALBUMIN 2.7* 2.6*    Cardiac Enzymes  Recent Labs Lab 01/07/16 1800 01/07/16 2355 01/08/16 0530  TROPONINI 0.15* 0.20* 0.18*    Glucose  Recent Labs Lab 01/09/16 0756 01/09/16 1220 01/09/16 1550 01/09/16 2041 01/10/16 0004 01/10/16 0433  GLUCAP 149* 133* 144* 158* 125* 82    Imaging No results found.   STUDIES:  Echo   >>  CULTURES:   ANTIBIOTICS:   SIGNIFICANT EVENTS: 4/14 vfib event leading to resp failure along with MODS  4/16 extubated successfully  LINES/TUBES: Left biventricular AICD  DISCUSSION: 82 y/o male s/p V Fib arrest, presumably brought on by NSTEMI.  Has CHF.  Was successfully extubated, renal function improving.  ASSESSMENT / PLAN:  PULMONARY A: VDRF post v fib arrest > resolved CHF IACD P:   Monitor post extubation BD's prn  CARDIOVASCULAR A:  Hx of IACD implantation  CHF V fib cardiac arrest HTN Chest soreness from CPR P:  Normothermia protocol Cath later this week? Transfer to Telemetry Pressors off  APAP/Percocet prn  RENAL Base creatine 2.03  2/17 A:   AKI on [name] 4, on lisinopril > improving P:   Follow creatine, urine output Hold D10 as eating   GASTROINTESTINAL A:   GI protection Constipation P:   PPI Bowel regimen today Add enema today  HEMATOLOGIC A:   No acute issue P:    INFECTIOUS A:   Possible aspiration during  cpr Elevated wbc 12/ low gr fever P:   Empiric unasyn, would plan 5 total days  ENDOCRINE A:   DM P:   SSI q 4h   NEUROLOGIC A:  Acute encephalopathy > resolved Head CT neg P:   RASS goal:0 Prn APAP, percocet PT consult today    FAMILY  - Updates: Updated at bedside by RB on admission. He has done well since then.   - Inter-disciplinary family meet or Palliative  Care meeting due by:  day 7  Summary - Improving neuro status post arrest, renal function improving. Plan to move to floor.   Roselie Awkward, MD [facility] Pager: [phone] Cell: [phone] After 3pm or if no response, call [phone]      01/10/2016, 9:27 AM

## 2016-01-10 NOTE — Clinical Documentation Improvement (Signed)
Critical Care  Abnormal Lab/Test Results:  Urinalysis with large amount of leukocytes, cloudy in appearance, and few bacteria  Possible Clinical Conditions associated with below indicators  UTI  Other Condition  Cannot Clinically Determine   Supporting Information: Pt has low grade fever.     Please exercise your independent, professional judgment when responding. A specific answer is not anticipated or expected.   Thank You,  [place] [phone]

## 2016-01-11 ENCOUNTER — Telehealth: Payer: Self-pay | Admitting: Cardiology

## 2016-01-11 DIAGNOSIS — I255 Ischemic cardiomyopathy: Secondary | ICD-10-CM

## 2016-01-11 DIAGNOSIS — I5023 Acute on chronic systolic (congestive) heart failure: Secondary | ICD-10-CM

## 2016-01-11 LAB — BASIC METABOLIC PANEL
ANION GAP: 10 (ref 5–15)
BUN: 47 mg/dL — ABNORMAL HIGH (ref 6–20)
CO2: 28 mmol/L (ref 22–32)
Calcium: 8.9 mg/dL (ref 8.9–10.3)
Chloride: 98 mmol/L — ABNORMAL LOW (ref 101–111)
Creatinine, Ser: 2.77 mg/dL — ABNORMAL HIGH (ref 0.61–1.24)
GFR, EST AFRICAN AMERICAN: 23 mL/min — AB (ref >60)
GFR, EST NON AFRICAN AMERICAN: 20 mL/min — AB (ref >60)
GLUCOSE: 174 mg/dL — AB (ref 65–99)
POTASSIUM: 4.5 mmol/L (ref 3.5–5.1)
Sodium: 136 mmol/L (ref 135–145)

## 2016-01-11 LAB — POCT I-STAT, CHEM 8
BUN: 101 mg/dL — ABNORMAL HIGH (ref 6–20)
CHLORIDE: 102 mmol/L (ref 101–111)
Calcium, Ion: 1.17 mmol/L (ref 1.13–1.30)
Creatinine, Ser: 5.5 mg/dL — ABNORMAL HIGH (ref 0.61–1.24)
Glucose, Bld: 122 mg/dL — ABNORMAL HIGH (ref 65–99)
HEMATOCRIT: 42 % (ref 39.0–52.0)
HEMOGLOBIN: 14.3 g/dL (ref 13.0–17.0)
POTASSIUM: 6.8 mmol/L — AB (ref 3.5–5.1)
SODIUM: 134 mmol/L — AB (ref 135–145)
TCO2: 25 mmol/L (ref 0–100)

## 2016-01-11 LAB — CBC WITH DIFFERENTIAL/PLATELET
BASOS ABS: 0 10*3/uL (ref 0.0–0.1)
Basophils Relative: 0 %
EOS ABS: 0.2 10*3/uL (ref 0.0–0.7)
Eosinophils Relative: 2 %
HEMATOCRIT: 29.9 % — AB (ref 39.0–52.0)
HEMOGLOBIN: 9.8 g/dL — AB (ref 13.0–17.0)
LYMPHS PCT: 19 %
Lymphs Abs: 2.3 10*3/uL (ref 0.7–4.0)
MCH: 29.6 pg (ref 26.0–34.0)
MCHC: 32.8 g/dL (ref 30.0–36.0)
MCV: 90.3 fL (ref 78.0–100.0)
MONOS PCT: 15 %
Monocytes Absolute: 1.8 10*3/uL — ABNORMAL HIGH (ref 0.1–1.0)
NEUTROS ABS: 7.7 10*3/uL (ref 1.7–7.7)
Neutrophils Relative %: 64 %
Platelets: 187 10*3/uL (ref 150–400)
RBC: 3.31 MIL/uL — ABNORMAL LOW (ref 4.22–5.81)
RDW: 14.6 % (ref 11.5–15.5)
WBC: 12 10*3/uL — ABNORMAL HIGH (ref 4.0–10.5)

## 2016-01-11 LAB — GLUCOSE, CAPILLARY
GLUCOSE-CAPILLARY: 136 mg/dL — AB (ref 65–99)
GLUCOSE-CAPILLARY: 179 mg/dL — AB (ref 65–99)
GLUCOSE-CAPILLARY: 171 mg/dL — AB (ref 65–99)
Glucose-Capillary: 164 mg/dL — ABNORMAL HIGH (ref 65–99)
Glucose-Capillary: 188 mg/dL — ABNORMAL HIGH (ref 65–99)

## 2016-01-11 MED ORDER — ASPIRIN EC 81 MG PO TBEC
81.0000 mg | DELAYED_RELEASE_TABLET | Freq: Every day | ORAL | Status: DC
  Administered 2016-01-11 – 2016-01-14 (×4): 81 mg via ORAL
  Filled 2016-01-11 (×4): qty 1

## 2016-01-11 MED ORDER — CARVEDILOL 3.125 MG PO TABS
3.1250 mg | ORAL_TABLET | Freq: Two times a day (BID) | ORAL | Status: DC
  Administered 2016-01-11 (×2): 3.125 mg via ORAL
  Filled 2016-01-11 (×2): qty 1

## 2016-01-11 NOTE — Telephone Encounter (Signed)
LMOVM reminding pt to send remote transmission.   

## 2016-01-11 NOTE — Progress Notes (Signed)
SUBJECTIVE: The patient is doing well today.  At this time, he denies chest pain, shortness of breath, or any new concerns.  CURRENT MEDICATIONS: . antiseptic oral rinse  7 mL Mouth Rinse BID  . aspirin EC  81 mg Oral Daily  . carvedilol  3.125 mg Oral BID WC  . feeding supplement (ENSURE ENLIVE)  237 mL Oral Q24H  . insulin aspart  0-15 Units Subcutaneous 6 times per day      OBJECTIVE: Physical Exam: Filed Vitals:   01/10/16 1300 01/10/16 1448 01/10/16 2219 01/11/16 0421  BP: 119/74 120/50 117/44 105/58  Pulse: 77 67 77 78  Temp:  98.4 F (36.9 C) 100 F (37.8 C) 98.7 F (37.1 C)  TempSrc:  Oral Oral Oral  Resp: 23 18 20 18   Height:      Weight:    163 lb 11.2 oz (74.254 kg)  SpO2: 90% 98% 95% 95%    Intake/Output Summary (Last 24 hours) at 01/11/16 0850 Last data filed at 01/11/16 0421  Gross per 24 hour  Intake    540 ml  Output    315 ml  Net    225 ml    Telemetry reveals sinus rhythm  GEN- The patient is chronically ill and thin appearing, alert and oriented x 3 today.   Head- normocephalic, atraumatic Eyes-  Sclera clear, conjunctiva pink Ears- hearing intact Oropharynx- clear Neck- supple  Lungs- Clear to ausculation bilaterally, normal work of breathing, scattered basilar rales Heart- Regular rate and rhythm  GI- soft, NT, ND, + BS Extremities- no clubbing, cyanosis, or edema Skin- no rash or lesion Psych- euthymic mood, full affect Neuro- strength and sensation are intact  LABS: Basic Metabolic Panel:  Recent Labs  01/09/16 0245 01/10/16 0240 01/11/16 0240  NA 133* 134* 136  K 5.2* 4.6 4.5  CL 96* 96* 98*  CO2 29 29 28   GLUCOSE 133* 79 174*  BUN 59* 51* 47*  CREATININE 3.70* 3.19* 2.77*  CALCIUM 8.9 8.9 8.9  PHOS 3.0 3.5  --    Liver Function Tests:  Recent Labs  01/09/16 0245 01/10/16 0240  ALBUMIN 2.7* 2.6*   CBC:  Recent Labs  01/09/16 0245 01/11/16 0240  WBC 15.9* 12.0*  NEUTROABS  --  7.7  HGB 11.8* 9.8*    HCT 36.1* 29.9*  MCV 87.8 90.3  PLT 186 187    RADIOLOGY: Dg Chest Port 1 View 01/09/2016  CLINICAL DATA:  82 year old male with history of acute respiratory failure. EXAM: PORTABLE CHEST 1 VIEW COMPARISON:  Chest x-ray 01/08/2016. FINDINGS: An endotracheal tube is in place with tip 3.7 cm above the carina. A nasogastric tube is seen extending into the stomach, however, the tip of the nasogastric tube extends below the lower margin of the image. Left-sided biventricular pacemaker/AICD with lead tips projecting over the expected location of the right atrium, right ventricular apex and overlying the left ventricle via the coronary sinus and coronary veins. Lung volumes are normal. Patchy opacities are noted throughout the mid to lower lungs bilaterally, which are new compared to the prior study. Probable small left pleural effusion. Mild cardiomegaly. Upper mediastinal contours are within normal limits. Atherosclerosis in the thoracic aorta. IMPRESSION: 1. Support apparatus, as above. 2. Interval development of patchy ill-defined opacities throughout the mid to lower lungs bilaterally, concerning for sequela of aspiration or developing infection. Some component of underlying atelectasis is also likely present. 3. Probable small left pleural effusion. 4. Mild cardiomegaly. Electronically Signed  By: Vinnie Langton M.D.   On: 01/09/2016 07:49    ASSESSMENT AND PLAN:  Active Problems:   Cardiac arrest ([place])   Acute respiratory failure with hypoxemia (HCC)   Acute renal failure (ARF) (HCC)   AKI (acute kidney injury) ([place])   1.  VF arrest S/p appropriate device therapy No further VT while in hospital  2.  Ischemic cardiomyopathy Will need LHC when renal function improves Resume ASA I suspect worsening ischemia is behind his clinical episode  3.  Acute on Chronic systolic heart failure Euvolemic today Will need to resume HF meds Will add Coreg 3.125mg  bid today - was on 25mg  bid at  home Continue to hold Lasix, Lisinopril with renal function  4.  AKI Improving Continue to monitor LHC when able  Chanetta Marshall, NP 01/11/2016 8:50 AM  EP Attending  Patient seen and examined. Agree with above. He is improving. On exam he has a mostly clear lungs. Will hope to cath if kidney function continues to improve.  Mikle Bosworth.D.

## 2016-01-11 NOTE — Progress Notes (Signed)
PULMONARY / CRITICAL CARE MEDICINE   Name: [name] MRN: [id number] DOB: Aug 16, 1931    ADMISSION DATE:  01/07/2016   REFERRING MD: EDP  CHIEF COMPLAINT:  V fib arrest  HISTORY OF PRESENT ILLNESS:   [name] , long term [place] cardiology pt who has known CHF and has AICD in place who was in his usual state of health and at his [facility] when he was found with bradycardia and agonal breathing. He was coded x 15 minutes with CPR. Noted by AICD interrogation he had a Vfib event and AICD delivered shock prior to CPR. He was placed on normothermic protocol.    SUBJECTIVE:  Wants catheter out    VITAL SIGNS: BP 104/58 mmHg  Pulse 75  Temp(Src) 98.7 F (37.1 C) (Oral)  Resp 18  Ht 5\' 7"  (1.702 m)  Wt 163 lb 11.2 oz (74.254 kg)  BMI 25.63 kg/m2  SpO2 95%  HEMODYNAMICS:    VENTILATOR SETTINGS:    INTAKE / OUTPUT: I/O last 3 completed shifts: In: 930 [P.O.:480; I.V.:450] Out: 1210 [Urine:1210]  PHYSICAL EXAMINATION: General:  Lying comfortably in bed; no distress HENT: NCAT OP clear PULM: CTA B, normal effort CV: distant heart sounds GI: belly soft, infrequent bowel sounds Neuro: Awake, alert, answering questions  LABS:  BMET  Recent Labs Lab 01/09/16 0245 01/10/16 0240 01/11/16 0240  NA 133* 134* 136  K 5.2* 4.6 4.5  CL 96* 96* 98*  CO2 29 29 28   BUN 59* 51* 47*  CREATININE 3.70* 3.19* 2.77*  GLUCOSE 133* 79 174*    Electrolytes  Recent Labs Lab 01/08/16 0530 01/09/16 0245 01/10/16 0240 01/11/16 0240  CALCIUM 9.8 8.9 8.9 8.9  MG 2.2  --   --   --   PHOS 2.6 3.0 3.5  --     CBC  Recent Labs Lab 01/08/16 0530 01/09/16 0245 01/11/16 0240  WBC 11.4* 15.9* 12.0*  HGB 10.7* 11.8* 9.8*  HCT 32.9* 36.1* 29.9*  PLT 202 186 187    Coag's  Recent Labs Lab 01/07/16 1220 01/07/16 1800  APTT  --  37  INR 1.42 1.32    Sepsis Markers  Recent Labs Lab 01/07/16 1221  LATICACIDVEN 2.99*    ABG  Recent Labs Lab 01/07/16 1216   PHART 7.292*  PCO2ART 50.8*  PO2ART 245.0*    Liver Enzymes  Recent Labs Lab 01/09/16 0245 01/10/16 0240  ALBUMIN 2.7* 2.6*    Cardiac Enzymes  Recent Labs Lab 01/07/16 1800 01/07/16 2355 01/08/16 0530  TROPONINI 0.15* 0.20* 0.18*    Glucose  Recent Labs Lab 01/10/16 1151 01/10/16 1608 01/10/16 2054 01/10/16 2358 01/11/16 0417 01/11/16 1103  GLUCAP 171* 150* 157* 118* 136* 179*    Imaging No results found.   STUDIES:  Echo   >>  CULTURES:   ANTIBIOTICS:   SIGNIFICANT EVENTS: 4/14 vfib event leading to resp failure along with MODS  4/16 extubated successfully  LINES/TUBES: Left biventricular AICD  DISCUSSION: 82 y/o male s/p V Fib arrest, presumably brought on by NSTEMI.  Has CHF.  Was successfully extubated, renal function improving.  ASSESSMENT / PLAN:   V fib cardiac arrest Hx of IACD implantation  CHF HTN Chest soreness from CPR Plan:  Cath later this week? Cont Telemetry APAP/Percocet prn  RENALAKI on [name] 4, on lisinopril > improving Base creatine 2.03 2/17 Plan:   Follow creatine, urine output Dc foley   Constipation Plan:   PPI Bowel regimen today Add enema today  Possible aspiration during  cpr Elevated wbc 12/ low gr fever Plan:   Empiric unasyn, would plan to complete  5 total days  DM Plan:   SSI q 4h    FAMILY   Summary - Improving neuro status post arrest, renal function improving.  [facility] to assume care effective 4/19 am    Erick Colace ACNP-BC [place] Pager # [phone] OR # [phone] if no answer          01/11/2016, 12:13 PM

## 2016-01-11 NOTE — Progress Notes (Signed)
Subjective:  BP improving overnight (91-122/49-58).  UOP 485 cc w/ 1 unmeasured urine. Creatinine continues to improve (2.77).   Objective Vital signs in last 24 hours: Filed Vitals:   01/10/16 1448 01/10/16 2219 01/11/16 0421 01/11/16 1107  BP: 120/50 117/44 105/58 104/58  Pulse: 67 77 78 75  Temp: 98.4 F (36.9 C) 100 F (37.8 C) 98.7 F (37.1 C)   TempSrc: Oral Oral Oral   Resp: 18 20 18    Height:      Weight:   163 lb 11.2 oz (74.254 kg)   SpO2: 98% 95% 95%    Weight change:   Intake/Output Summary (Last 24 hours) at 01/11/16 1309 Last data filed at 01/11/16 0900  Gross per 24 hour  Intake    720 ml  Output    200 ml  Net    520 ml   Physical Exam:  General: awake, alert Heart: Regular rate Lungs: normal WOB on room air Abdomen: soft, non tender Extremities: no peripheral edema  Labs: Results for orders placed or performed during the hospital encounter of 01/07/16 (from the past 24 hour(s))  Glucose, capillary     Status: Abnormal   Collection Time: 01/10/16  4:08 PM  Result Value Ref Range   Glucose-Capillary 150 (H) 65 - 99 mg/dL   Comment 1 Notify RN    Comment 2 Document in Chart   Glucose, capillary     Status: Abnormal   Collection Time: 01/10/16  8:54 PM  Result Value Ref Range   Glucose-Capillary 157 (H) 65 - 99 mg/dL  Glucose, capillary     Status: Abnormal   Collection Time: 01/10/16 11:58 PM  Result Value Ref Range   Glucose-Capillary 118 (H) 65 - 99 mg/dL  Basic metabolic panel     Status: Abnormal   Collection Time: 01/11/16  2:40 AM  Result Value Ref Range   Sodium 136 135 - 145 mmol/L   Potassium 4.5 3.5 - 5.1 mmol/L   Chloride 98 (L) 101 - 111 mmol/L   CO2 28 22 - 32 mmol/L   Glucose, Bld 174 (H) 65 - 99 mg/dL   BUN 47 (H) 6 - 20 mg/dL   Creatinine, Ser 2.77 (H) 0.61 - 1.24 mg/dL   Calcium 8.9 8.9 - 10.3 mg/dL   GFR calc non Af Amer 20 (L) >60 mL/min   GFR calc Af Amer 23 (L) >60 mL/min   Anion gap 10 5 - 15  CBC with  Differential/Platelet     Status: Abnormal   Collection Time: 01/11/16  2:40 AM  Result Value Ref Range   WBC 12.0 (H) 4.0 - 10.5 K/uL   RBC 3.31 (L) 4.22 - 5.81 MIL/uL   Hemoglobin 9.8 (L) 13.0 - 17.0 g/dL   HCT 29.9 (L) 39.0 - 52.0 %   MCV 90.3 78.0 - 100.0 fL   MCH 29.6 26.0 - 34.0 pg   MCHC 32.8 30.0 - 36.0 g/dL   RDW 14.6 11.5 - 15.5 %   Platelets 187 150 - 400 K/uL   Neutrophils Relative % 64 %   Lymphocytes Relative 19 %   Monocytes Relative 15 %   Eosinophils Relative 2 %   Basophils Relative 0 %   Neutro Abs 7.7 1.7 - 7.7 K/uL   Lymphs Abs 2.3 0.7 - 4.0 K/uL   Monocytes Absolute 1.8 (H) 0.1 - 1.0 K/uL   Eosinophils Absolute 0.2 0.0 - 0.7 K/uL   Basophils Absolute 0.0 0.0 - 0.1 K/uL  WBC Morphology ATYPICAL LYMPHOCYTES   Glucose, capillary     Status: Abnormal   Collection Time: 01/11/16  4:17 AM  Result Value Ref Range   Glucose-Capillary 136 (H) 65 - 99 mg/dL  Glucose, capillary     Status: Abnormal   Collection Time: 01/11/16 11:03 AM  Result Value Ref Range   Glucose-Capillary 179 (H) 65 - 99 mg/dL   Comment 1 Notify RN    Comment 2 Document in Chart     Assessment/Plan: 82 year old black male with an extensive cardiac history - recent acute kidney injury and also [name] with creatinine of 2 at the end of February. He now presents with acute on chronic renal failure in the setting of a cardiac arrest on lisinopril  1.Renal- acute on chronic kidney injury in the setting of a cardiac arrest and also on lisinopril. Likely secondary to ATN. UOP fair.  Kidney function appears to be gradually recovering.  Cr improving to 2.77, BUN 47 2. Hyperkalemia - in the setting of ATN/AKI and lisinopril therapy.  Lisinopril is on hold. Potassium normalized 4.5. 3. Anemia - hgb down about 2 points from 4/16.  hgb 9.8 today. 4. HTN/vol- no evidence of fluid overload, fluids discontinued.  BP soft to controlled off of Lisinopril.  On coreg. 5. Hypoglycemia- resolved.   Renal  function improving.  Will sign off.  Please do not hesitate to re-consult if the needs arises.   Snigdha Howser, DO 01/11/2016,1:09 PM  LOS: 4 days

## 2016-01-11 NOTE — Progress Notes (Signed)
Foley catheter removed.  Pt tol well.  Will cont to monitor.

## 2016-01-11 NOTE — Clinical Social Work Note (Signed)
Clinical Social Work Assessment  Patient Details  Name: [name] MRN: [id number] Date of Birth: Jun 07, 1931  Date of referral:  01/11/16               Reason for consult:  Discharge Planning                Permission sought to share information with:  Chartered certified accountant granted to share information::  Yes, Verbal Permission Granted  Name::     [name] (Patient's sister)  Agency::  SNF  Relationship::  (Patient's sister)  Contact Information:  Agricultural engineer (Patient's sister)  Housing/Transportation Living arrangements for the past 2 months:  Apartment Source of Information:  Patient, Other (Comment Required) (Sister ) Patient Interpreter Needed:  None Criminal Activity/Legal Involvement Pertinent to Current Situation/Hospitalization:  No - Comment as needed Significant Relationships:  Siblings Lives with:  Self Do you feel safe going back to the place where you live?  Yes Need for family participation in patient care:  Yes (Comment)  Care giving concerns:  The patient and patient's sister are agreeable to recommendation for short term rehab at discharge. He shares that wants to go to rehab so he can get strong enough to go home.     Social Worker assessment / plan:  CSW met with patient at bedside to complete assessment. Patient was resting comfortably in bed. CSW explained PT recommendation for SNF placement. CSW explained SNF search and placement process to the patient and answered his questions. Patient reported his main support is his sister.  The patient expressed interested in [facility]. Per patient's request CSW called patient's sister. Patient's sister reported that she is in agreement with PT recommendations and also would like patient to go to [facility] if possible.  Employment status:  Retired Forensic scientist:  [employer] PT Recommendations:  [place] / Referral to community  resources:  [place]  Patient/Family's Response to care:  The patient appears to be happy with the care his is receiving in hospital and is apperceptive of CSW assistance.   Patient/Family's Understanding of and Emotional Response to Diagnosis, Current Treatment, and Prognosis:  The patient has a fair understanding of why he was admitted. The patient and patient's sister understand the care plan and what the patient will need post discharge.   Emotional Assessment Appearance:  Appears stated age Attitude/Demeanor/Rapport:   (Patient was welcoming of CSW and appropriate. ) Affect (typically observed):  Calm, Appropriate, Accepting, Pleasant Orientation:  Oriented to Self, Oriented to Place Alcohol / Substance use:  Not Applicable Psych involvement (Current and /or in the community):  No (Comment)  Discharge Needs  Concerns to be addressed:  Discharge Planning Concerns Readmission within the last 30 days:  No Current discharge risk:  Chronically ill, Physical Impairment, Lives alone Barriers to Discharge:  Continued Medical Work up  [employer], LCSW [phone] 01/11/2016, 2:28 PM

## 2016-01-11 NOTE — Progress Notes (Signed)
Utilization review completed.  

## 2016-01-12 DIAGNOSIS — E1122 Type 2 diabetes mellitus with diabetic chronic kidney disease: Secondary | ICD-10-CM

## 2016-01-12 DIAGNOSIS — Z794 Long term (current) use of insulin: Secondary | ICD-10-CM

## 2016-01-12 DIAGNOSIS — N184 Chronic kidney disease, stage 4 (severe): Secondary | ICD-10-CM

## 2016-01-12 LAB — BASIC METABOLIC PANEL
Anion gap: 7 (ref 5–15)
BUN: 37 mg/dL — AB (ref 6–20)
CALCIUM: 9.2 mg/dL (ref 8.9–10.3)
CO2: 29 mmol/L (ref 22–32)
CREATININE: 2.37 mg/dL — AB (ref 0.61–1.24)
Chloride: 101 mmol/L (ref 101–111)
GFR calc Af Amer: 28 mL/min — ABNORMAL LOW (ref >60)
GFR calc non Af Amer: 24 mL/min — ABNORMAL LOW (ref >60)
GLUCOSE: 108 mg/dL — AB (ref 65–99)
Potassium: 4.2 mmol/L (ref 3.5–5.1)
Sodium: 137 mmol/L (ref 135–145)

## 2016-01-12 LAB — GLUCOSE, CAPILLARY
GLUCOSE-CAPILLARY: 106 mg/dL — AB (ref 65–99)
GLUCOSE-CAPILLARY: 165 mg/dL — AB (ref 65–99)
Glucose-Capillary: 155 mg/dL — ABNORMAL HIGH (ref 65–99)
Glucose-Capillary: 122 mg/dL — ABNORMAL HIGH (ref 65–99)

## 2016-01-12 MED ORDER — SODIUM CHLORIDE 0.9 % IV SOLN
1.5000 g | Freq: Two times a day (BID) | INTRAVENOUS | Status: DC
  Administered 2016-01-12 – 2016-01-13 (×3): 1.5 g via INTRAVENOUS
  Filled 2016-01-12 (×4): qty 1.5

## 2016-01-12 MED ORDER — SODIUM CHLORIDE 0.9 % IV SOLN
1.5000 g | Freq: Two times a day (BID) | INTRAVENOUS | Status: DC
  Filled 2016-01-12: qty 1.5

## 2016-01-12 MED ORDER — CARVEDILOL 6.25 MG PO TABS
6.2500 mg | ORAL_TABLET | Freq: Two times a day (BID) | ORAL | Status: DC
  Administered 2016-01-12 – 2016-01-14 (×4): 6.25 mg via ORAL
  Filled 2016-01-12 (×4): qty 1

## 2016-01-12 NOTE — Progress Notes (Signed)
ANTIBIOTIC CONSULT NOTE - INITIAL  Pharmacy Consult for Unasyn Indication: Aspiration PNA  No Known Allergies  Patient Measurements: Height: 5\' 7"  (170.2 cm) Weight: 161 lb 11.2 oz (73.347 kg) IBW/kg (Calculated) : 66.1 Adjusted Body Weight:    Vital Signs: Temp: 98.6 F (37 C) (04/19 0400) Temp Source: Oral (04/19 0400) BP: 140/61 mmHg (04/19 0400) Pulse Rate: 72 (04/19 0400) Intake/Output from previous day: 04/18 0701 - 04/19 0700 In: 1200 [P.O.:1200] Out: 600 [Urine:600] Intake/Output from this shift: Total I/O In: 240 [P.O.:240] Out: -   Labs:  Recent Labs  01/10/16 0240 01/11/16 0240 01/12/16 0552  WBC  --  12.0*  --   HGB  --  9.8*  --   PLT  --  187  --   CREATININE 3.19* 2.77* 2.37*   Estimated Creatinine Clearance: 22.1 mL/min (by C-G formula based on Cr of 2.37). No results for input(s): VANCOTROUGH, VANCOPEAK, VANCORANDOM, GENTTROUGH, GENTPEAK, GENTRANDOM, TOBRATROUGH, TOBRAPEAK, TOBRARND, AMIKACINPEAK, AMIKACINTROU, AMIKACIN in the last 72 hours.   Microbiology:   Medical History: Past Medical History  Diagnosis Date  . Presence of permanent cardiac pacemaker   . AICD (automatic cardioverter/defibrillator) present   . Myocardial infarction ([place])   . Coronary artery disease   . Hypertension   . Dysrhythmia   . Peripheral vascular disease ([place])   . CHF (congestive heart failure) ([place])   . Diabetes mellitus without complication ([place])   . Chronic kidney disease   . Anemia     Assessment: 82 yo man admitted 01/07/2016 s/p VFib arrest, ROSC after 15 min.   PMH HFrEF (EF 30-40%) s/p AICD, anemia, CAD s/p multiple PCI, HLD, PVD s/p L carotid stent, TIA, ED, hx RBBB, HTN, DM2, [name]  ID: Aspiration pneumonia / pneumonitis likely aspirated during CPR. Plan Unasyn x 5d. Afebrile. WBC 12  Goal of Therapy:  Eradication of infection   Plan:  Unasyn 1.5g IV q12 hrs x 5 days   Shaheen Star S. Alford Highland, PharmD, BCPS Clinical Staff Pharmacist Pager  [place], [place] 01/12/2016,1:24 PM

## 2016-01-12 NOTE — Progress Notes (Signed)
0942 Received order. We will continue to follow his progress with PT and begin seeing when appropriate. Unable to walk with PT on 4/17. Graylon Good RN BSN 01/12/2016 9:44 AM

## 2016-01-12 NOTE — Progress Notes (Signed)
SUBJECTIVE: The patient is doing well today.  At this time, he denies chest pain, shortness of breath, or any new concerns.  CURRENT MEDICATIONS: . antiseptic oral rinse  7 mL Mouth Rinse BID  . aspirin EC  81 mg Oral Daily  . carvedilol  3.125 mg Oral BID WC  . feeding supplement (ENSURE ENLIVE)  237 mL Oral Q24H  . insulin aspart  0-15 Units Subcutaneous 6 times per day      OBJECTIVE: Physical Exam: Filed Vitals:   01/11/16 1107 01/11/16 1429 01/11/16 2131 01/12/16 0400  BP: 104/58 130/52 122/49 140/61  Pulse: 75 78 69 72  Temp:  98.8 F (37.1 C) 98.2 F (36.8 C) 98.6 F (37 C)  TempSrc:  Oral Oral Oral  Resp:  20 20 16   Height:      Weight:    161 lb 11.2 oz (73.347 kg)  SpO2:  97% 100% 99%    Intake/Output Summary (Last 24 hours) at 01/12/16 0715 Last data filed at 01/12/16 0412  Gross per 24 hour  Intake   1200 ml  Output    600 ml  Net    600 ml    Telemetry reveals sinus rhythm with V pacing  GEN- The patient is chronically ill and thin appearing, alert and oriented x 3 today.   Head- normocephalic, atraumatic Eyes-  Sclera clear, conjunctiva pink Ears- hearing intact Oropharynx- clear Neck- supple  Lungs- Clear to ausculation bilaterally, normal work of breathing, scattered basilar rales Heart- Regular rate and rhythm  GI- soft, NT, ND, + BS Extremities- no clubbing, cyanosis, or edema Skin- no rash or lesion Psych- euthymic mood, full affect Neuro- strength and sensation are intact  LABS: Basic Metabolic Panel:  Recent Labs  01/10/16 0240 01/11/16 0240 01/12/16 0552  NA 134* 136 137  K 4.6 4.5 4.2  CL 96* 98* 101  CO2 29 28 29   GLUCOSE 79 174* 108*  BUN 51* 47* 37*  CREATININE 3.19* 2.77* 2.37*  CALCIUM 8.9 8.9 9.2  PHOS 3.5  --   --    Liver Function Tests:  Recent Labs  01/10/16 0240  ALBUMIN 2.6*   CBC:  Recent Labs  01/11/16 0240  WBC 12.0*  NEUTROABS 7.7  HGB 9.8*  HCT 29.9*  MCV 90.3  PLT 187     RADIOLOGY: Dg Chest Port 1 View 01/09/2016  CLINICAL DATA:  82 year old male with history of acute respiratory failure. EXAM: PORTABLE CHEST 1 VIEW COMPARISON:  Chest x-ray 01/08/2016. FINDINGS: An endotracheal tube is in place with tip 3.7 cm above the carina. A nasogastric tube is seen extending into the stomach, however, the tip of the nasogastric tube extends below the lower margin of the image. Left-sided biventricular pacemaker/AICD with lead tips projecting over the expected location of the right atrium, right ventricular apex and overlying the left ventricle via the coronary sinus and coronary veins. Lung volumes are normal. Patchy opacities are noted throughout the mid to lower lungs bilaterally, which are new compared to the prior study. Probable small left pleural effusion. Mild cardiomegaly. Upper mediastinal contours are within normal limits. Atherosclerosis in the thoracic aorta. IMPRESSION: 1. Support apparatus, as above. 2. Interval development of patchy ill-defined opacities throughout the mid to lower lungs bilaterally, concerning for sequela of aspiration or developing infection. Some component of underlying atelectasis is also likely present. 3. Probable small left pleural effusion. 4. Mild cardiomegaly. Electronically Signed   By: Vinnie Langton M.D.   On: 01/09/2016  07:49    ASSESSMENT AND PLAN:  Active Problems:   Cardiac arrest ([place])   Acute respiratory failure with hypoxemia (HCC)   Acute renal failure (ARF) (HCC)   AKI (acute kidney injury) ([place])   1.  VF arrest S/p appropriate device therapy No further VT while in hospital  2.  Ischemic cardiomyopathy Will need LHC when renal function improves Continue ASA I suspect worsening ischemia is behind his clinical episode  3.  Acute on Chronic systolic heart failure Euvolemic today Will need to resume HF meds Increase Coreg to 6.25mg  bid today  Continue to hold Lasix, Lisinopril with renal function  4.   AKI Improving Continue to monitor LHC when able - hopefully tomorrow  Chanetta Marshall, NP 01/12/2016 7:15 AM   EP Attending  Patient seen and examined. Agree with the findings as noted above. See my note as well. He has had stablization after his VF arrest. Will follow and treat aggressively.  Mikle Bosworth.D.

## 2016-01-12 NOTE — Progress Notes (Signed)
PT Cancellation Note  Patient Details Name: [name] MRN: [id number] DOB: [birth year]   Cancelled Treatment:    Reason Eval/Treat Not Completed: Other (comment)  Pt just received his lunch and requested to eat.  Will f/u another time.     Catarina Hartshorn, [place] 01/12/2016, 12:06 PM

## 2016-01-12 NOTE — Clinical Social Work Placement (Signed)
   CLINICAL SOCIAL WORK PLACEMENT  NOTE  Date:  01/12/2016  Patient Details  Name: QUITMAN GRENIER MRN: [id number] Date of Birth: June 04, 1931  Clinical Social Work is seeking post-discharge placement for this patient at the [place] level of care (*CSW will initial, date and re-position this form in  chart as items are completed):  Yes   Patient/family provided with [facility] Work Department's list of facilities offering this level of care within the geographic area requested by the patient (or if unable, by the patient's family).  Yes   Patient/family informed of their freedom to choose among providers that offer the needed level of care, that participate in Medicare, Medicaid or managed care program needed by the patient, have an available bed and are willing to accept the patient.  Yes   Patient/family informed of [place]'s ownership interest in [facility] and [facility], as well as of the fact that they are under no obligation to receive care at these facilities.  PASRR submitted to EDS on       PASRR number received on       Existing PASRR number confirmed on 01/12/16     FL2 transmitted to all facilities in geographic area requested by pt/family on 01/12/16     FL2 transmitted to all facilities within larger geographic area on       Patient informed that his/her managed care company has contracts with or will negotiate with certain facilities, including the following:            Patient/family informed of bed offers received.  Patient chooses bed at       Physician recommends and patient chooses bed at      Patient to be transferred to   on  .  Patient to be transferred to facility by       Patient family notified on   of transfer.  Name of family member notified:        PHYSICIAN Please prepare priority discharge summary, including medications, Please sign FL2, Please prepare prescriptions     Additional Comment:     _______________________________________________ Samule Dry, LCSW 01/12/2016, 11:13 AM

## 2016-01-12 NOTE — Progress Notes (Signed)
Called by Dr. Kandice Robinsons Hospitalist] that patient has established care with the IM Clinic downstairs. IM Teaching Service will assume care tomorrow.

## 2016-01-12 NOTE — Progress Notes (Signed)
Patient has tried to voided several times tonight and was unable to. He was not distended or uncomfort. R.N. Aware foley D.C. Around 5 P.M. 01-10-2016 Bladder scan done 495 ml. I&O cath done 600 ml clr yellow urine.

## 2016-01-12 NOTE — NC FL2 (Signed)
[place] LEVEL OF CARE SCREENING TOOL     IDENTIFICATION  Patient Name: [name] Birthdate: 01-May-1931 Sex: male Admission Date (Current Location): 01/07/2016  [facility] and [place] Number:  Herbalist and Address:  [facility], [place] [address], [place], [place] [address]      Provider Number: [id number]  Attending Physician Name and Address:  Caren Griffins, MD  Relative Name and Phone Number:  [name]     Current Level of Care: Hospital Recommended Level of Care: [place] Prior Approval Number:    Date Approved/Denied:   PASRR Number: [id number]  Discharge Plan: SNF    Current Diagnoses: Patient Active Problem List   Diagnosis Date Noted  . AKI (acute kidney injury) ([place])   . Cardiac arrest ([place]) 01/07/2016  . Acute respiratory failure with hypoxemia ([place]) 01/07/2016  . Acute renal failure (ARF) ([place]) 01/07/2016  . Arterial hypotension     Orientation RESPIRATION BLADDER Height & Weight     Place, Self  Normal Indwelling catheter Weight: 161 lb 11.2 oz (73.347 kg) Height:  5\' 7"  (170.2 cm)  BEHAVIORAL SYMPTOMS/MOOD NEUROLOGICAL BOWEL NUTRITION STATUS   (None)  (None) Continent Diet (Heart Healthy)  AMBULATORY STATUS COMMUNICATION OF NEEDS Skin   Extensive Assist Verbally Other (Comment) (Open Ulceration right, anterior, Mid)                       Personal Care Assistance Level of Assistance  Bathing, Feeding, Dressing Bathing Assistance: Limited assistance Feeding assistance: Independent Dressing Assistance: Limited assistance     Functional Limitations Info  Sight, Hearing, Speech Sight Info: Adequate Hearing Info: Adequate Speech Info: Adequate    SPECIAL CARE FACTORS FREQUENCY  PT (By licensed PT), OT (By licensed OT)     PT Frequency: 5/week  OT Frequency: 5/week            Contractures Contractures Info: Not present    Additional Factors Info   Code Status  Insulin  Allergies Code Status Info: Full  6/ day  NKDA         Current Medications (01/12/2016):  This is the current hospital active medication list Current Facility-Administered Medications  Medication Dose Route Frequency Provider Last Rate Last Dose  . acetaminophen (TYLENOL) tablet 650 mg  650 mg Oral Q4H PRN Juanito Doom, MD   650 mg at 01/12/16 0134  . antiseptic oral rinse (CPC / CETYLPYRIDINIUM CHLORIDE 0.05%) solution 7 mL  7 mL Mouth Rinse BID Collene Gobble, MD   7 mL at 01/12/16 0816  . aspirin EC tablet 81 mg  81 mg Oral Daily Patsey Berthold, NP   81 mg at 01/12/16 0815  . atropine injection   Intravenous PRN Pattricia Boss, MD   1 mg at 01/07/16 1204  . bisacodyl (DULCOLAX) suppository 10 mg  10 mg Rectal Daily PRN Anders Simmonds, MD   10 mg at 01/10/16 0925  . carvedilol (COREG) tablet 6.25 mg  6.25 mg Oral BID WC Patsey Berthold, NP   6.25 mg at 01/12/16 0815  . docusate sodium (COLACE) capsule 100 mg  100 mg Oral BID PRN Anders Simmonds, MD   100 mg at 01/12/16 0815  . feeding supplement (ENSURE ENLIVE) (ENSURE ENLIVE) liquid 237 mL  237 mL Oral Q24H Juanito Doom, MD   237 mL at 01/11/16 2026  . insulin aspart (novoLOG) injection 0-15 Units  0-15  Units Subcutaneous 6 times per day Collene Gobble, MD   3 Units at 01/11/16 2358  . oxyCODONE (Oxy IR/ROXICODONE) immediate release tablet 5 mg  5 mg Oral Q6H PRN Juanito Doom, MD   5 mg at 01/11/16 2320     Discharge Medications: Please see discharge summary for a list of discharge medications.  Relevant Imaging Results:  Relevant Lab Results:   Additional Information SSN: [id number]  Samule Dry, LCSW

## 2016-01-12 NOTE — Progress Notes (Signed)
PROGRESS NOTE  CONNOLLY TREZISE [id number] DOB: [birth year] DOA: 01-21-16 PCP: No primary care provider on file. Outpatient Specialists:    LOS: 5 days   Brief Narrative: [name] , long term [facility] cardiology pt who has known CHF and has AICD in place who was in his usual state of health and at his [facility] when he was found with bradycardia and agonal breathing on 01-21-2023 December 31, 2015 . He was coded x 15 minutes with CPR. Noted by AICD interrogation he had a Vfib event and AICD delivered shock prior to CPR. He was placed on normothermic protocol.    FYI Due to presentation, patient was admitted under a new chart, his medical record number is [id number], PCP is [facility]. Discussed with Dr. Posey Pronto, Internal Medicine will take over pt on 4/20   Assessment & Plan: Active Problems:   Cardiac arrest ([place])   Acute respiratory failure with hypoxemia (HCC)   Acute renal failure (ARF) (HCC)   AKI (acute kidney injury) ([place])  V fib cardiac arrest  - Status post successful CPR, no events since admission - Cardiology / EP following  Chronic systolic CHF in the setting of ischemic cardiomyopathy - Per cardiology, plan for cardiac catheterization likely in 1-2 days once renal function is improving  HTN - Blood pressure is stable, gradual reintroduction of his Coreg - Hold Lasix as well as lisinopril due to renal failure  AKI on [name] 4 - most recent Cr 2.0 in February, elevated to 5.9 after cardiac arrest - Nephrology was consulted and followed patient hospitalized, signed off on 4/18. His creatinine is improving  Aspiration pneumonia / pneumonitis - Patient likely aspirated during CPR, and an elevated white count of 12 and low-grade fever, start Unasyn and plan to complete 5 days  IDDM 2 - Most recent hemoglobin A1c was 9.7 in February 2017 - CBG is fairly good on sliding scale, fasting this morning 108, continue current regimen   DVT prophylaxis: SCD Code Status: Full code Family Communication: no  family bedside Disposition Plan: home when ready  Barriers for discharge: cardiac cath plans  Consultants:   Cardiology   Procedures:  2D echo: Study Conclusions - Left ventricle: The cavity size was normal. Wall thickness was normal. Systolic function was moderately to severely reduced. The estimated ejection fraction was in the range of 30% to 35%. Akinesis of the mid-apicalanteroseptal, anterior, inferior, and apical myocardium; consistent with infarction in the distribution of the left anterior descending coronary artery. Doppler parameters are consistent with abnormal left ventricular relaxation (grade 1 diastolic dysfunction). Acoustic contrast opacification revealed no evidence ofthrombus. - Ventricular septum: Septal motion showed paradox. These changes are consistent with right ventricular pacing. - Aortic valve: There was mild stenosis. Valve area (VTI): 1.26 cm^2. Valve area (Vmax): 1.41 cm^2. Valve area (Vmean): 1.25 cm^2. - Pericardium, extracardiac: A trivial pericardial effusion was identified posterior to the heart.  Antimicrobials:  Unasyn 4/19 >>   Subjective: - Has no complaints this morning, is feeling good, denies any chest pain, denies any shortness of breath or palpitations. Does remember much from his arrest few days ago  Objective: Filed Vitals:   01/11/16 1107 01/11/16 1429 01/11/16 30-Dec-2129 01/12/16 0400  BP: 104/58 130/52 122/49 140/61  Pulse: 75 78 69 72  Temp:  98.8 F (37.1 C) 98.2 F (36.8 C) 98.6 F (37 C)  TempSrc:  Oral Oral Oral  Resp:  20 20 16   Height:      Weight:    73.347 kg (161  lb 11.2 oz)  SpO2:  97% 100% 99%    Intake/Output Summary (Last 24 hours) at 01/12/16 0930 Last data filed at 01/12/16 0412  Gross per 24 hour  Intake    960 ml  Output    600 ml  Net    360 ml   Filed Weights   01/07/16 1730 01/11/16 0421 01/12/16 0400  Weight: 73.7 kg (162 lb 7.7 oz) 74.254 kg (163 lb 11.2 oz) 73.347 kg (161 lb 11.2 oz)     Examination: Constitutional: NAD, calm, comfortable, talkative Filed Vitals:   01/11/16 1107 01/11/16 1429 01/11/16 2131 01/12/16 0400  BP: 104/58 130/52 122/49 140/61  Pulse: 75 78 69 72  Temp:  98.8 F (37.1 C) 98.2 F (36.8 C) 98.6 F (37 C)  TempSrc:  Oral Oral Oral  Resp:  20 20 16   Height:      Weight:    73.347 kg (161 lb 11.2 oz)  SpO2:  97% 100% 99%   Eyes: PERRL, lids and conjunctivae normal ENMT: Mucous membranes are moist.  Neck: normal, supple, no masses, no thyromegaly Respiratory: clear to auscultation bilaterally, no wheezing, no crackles. Normal respiratory effort. No accessory muscle use.  Cardiovascular: Regular rate and rhythm,no MRG. No extremity edema. 2+ pedal pulses. No carotid bruits.  Abdomen: no tenderness Bowel sounds positive.  Musculoskeletal: no clubbing / cyanosis.  Skin: no rashes, lesions, ulcers. No induration Neurologic: non focal  Psychiatric: Normal judgment and insight. Alert and oriented x 3. Normal mood.    Data Reviewed: I have personally reviewed following labs and imaging studies  CBC:  Recent Labs Lab 01/07/16 1220 01/08/16 0530 01/09/16 0245 01/11/16 0240  WBC 12.0* 11.4* 15.9* 12.0*  NEUTROABS  --   --   --  7.7  HGB 14.3  12.0*  14.3 10.7* 11.8* 9.8*  HCT 42.0  37.3*  42.0 32.9* 36.1* 29.9*  MCV 88.8 88.2 87.8 90.3  PLT 234 202 186 [id number]   Basic Metabolic Panel:  Recent Labs Lab 01/08/16 0530 01/09/16 0245 01/10/16 0240 01/11/16 0240 01/12/16 0552  NA 139 133* 134* 136 137  K 5.3* 5.2* 4.6 4.5 4.2  CL 104 96* 96* 98* 101  CO2 27 29 29 28 29   GLUCOSE 114* 133* 79 174* 108*  BUN 68* 59* 51* 47* 37*  CREATININE 4.21* 3.70* 3.19* 2.77* 2.37*  CALCIUM 9.8 8.9 8.9 8.9 9.2  MG 2.2  --   --   --   --   PHOS 2.6 3.0 3.5  --   --    GFR: Estimated Creatinine Clearance: 22.1 mL/min (by C-G formula based on Cr of 2.37). Liver Function Tests:  Recent Labs Lab 01/09/16 0245 01/10/16 0240  ALBUMIN 2.7*  2.6*   No results for input(s): LIPASE, AMYLASE in the last 168 hours. No results for input(s): AMMONIA in the last 168 hours. Coagulation Profile:  Recent Labs Lab 01/07/16 1220 01/07/16 1800  INR 1.42 1.32   Cardiac Enzymes:  Recent Labs Lab 01/07/16 1800 01/07/16 2355 01/08/16 0530  TROPONINI 0.15* 0.20* 0.18*   BNP (last 3 results) No results for input(s): PROBNP in the last 8760 hours. HbA1C: No results for input(s): HGBA1C in the last 72 hours. CBG:  Recent Labs Lab 01/11/16 1103 01/11/16 1641 01/11/16 2022 01/11/16 2348 01/12/16 0413  GLUCAP 179* 171* 164* 188* 106*   Lipid Profile: No results for input(s): CHOL, HDL, LDLCALC, TRIG, CHOLHDL, LDLDIRECT in the last 72 hours. Thyroid Function Tests: No results for input(s):  TSH, T4TOTAL, FREET4, T3FREE, THYROIDAB in the last 72 hours. Anemia Panel: No results for input(s): VITAMINB12, FOLATE, FERRITIN, TIBC, IRON, RETICCTPCT in the last 72 hours. Urine analysis:    Component Value Date/Time   COLORURINE YELLOW 01/07/2016 1837   APPEARANCEUR CLOUDY* 01/07/2016 1837   LABSPEC 1.014 01/07/2016 1837   PHURINE 5.5 01/07/2016 1837   GLUCOSEU NEGATIVE 01/07/2016 1837   HGBUR MODERATE* 01/07/2016 1837   [place] NEGATIVE 01/07/2016 1837   KETONESUR NEGATIVE 01/07/2016 1837   PROTEINUR NEGATIVE 01/07/2016 1837   NITRITE NEGATIVE 01/07/2016 1837   LEUKOCYTESUR LARGE* 01/07/2016 1837   Sepsis Labs: Invalid input(s): PROCALCITONIN, LACTICIDVEN  Recent Results (from the past 240 hour(s))  MRSA PCR Screening     Status: None   Collection Time: 01/07/16  5:15 PM  Result Value Ref Range Status   MRSA by PCR NEGATIVE NEGATIVE Final    Comment:        The GeneXpert MRSA Assay (FDA approved for NASAL specimens only), is one component of a comprehensive MRSA colonization surveillance program. It is not intended to diagnose MRSA infection nor to guide or monitor treatment for MRSA infections.        Radiology Studies: No results found.   Scheduled Meds: . antiseptic oral rinse  7 mL Mouth Rinse BID  . aspirin EC  81 mg Oral Daily  . carvedilol  6.25 mg Oral BID WC  . feeding supplement (ENSURE ENLIVE)  237 mL Oral Q24H  . insulin aspart  0-15 Units Subcutaneous 6 times per day   Continuous Infusions:    Marzetta Board, MD, PhD [facility] Pager [phone]  If 7PM-7AM, please contact night-coverage [email or web address] Password [email or web address] 01/12/2016, 9:30 AM

## 2016-01-13 ENCOUNTER — Encounter (HOSPITAL_COMMUNITY): Payer: Self-pay

## 2016-01-13 ENCOUNTER — Encounter (HOSPITAL_COMMUNITY)
Admission: EM | Disposition: A | Discharge status: Home Health | Payer: Self-pay | Source: Home / Self Care | Attending: Emergency Medicine

## 2016-01-13 ENCOUNTER — Ambulatory Visit (HOSPITAL_COMMUNITY): Admit: 2016-01-13 | Payer: Self-pay | Admitting: Cardiovascular Disease

## 2016-01-13 DIAGNOSIS — E119 Type 2 diabetes mellitus without complications: Secondary | ICD-10-CM

## 2016-01-13 DIAGNOSIS — I509 Heart failure, unspecified: Secondary | ICD-10-CM

## 2016-01-13 DIAGNOSIS — R300 Dysuria: Secondary | ICD-10-CM

## 2016-01-13 DIAGNOSIS — I2510 Atherosclerotic heart disease of native coronary artery without angina pectoris: Secondary | ICD-10-CM

## 2016-01-13 DIAGNOSIS — R339 Retention of urine, unspecified: Secondary | ICD-10-CM

## 2016-01-13 DIAGNOSIS — I255 Ischemic cardiomyopathy: Secondary | ICD-10-CM

## 2016-01-13 HISTORY — PX: CARDIAC CATHETERIZATION: SHX172

## 2016-01-13 LAB — COMPREHENSIVE METABOLIC PANEL
ALBUMIN: 2.3 g/dL — AB (ref 3.5–5.0)
ALK PHOS: 39 U/L (ref 38–126)
ALT: 23 U/L (ref 17–63)
AST: 30 U/L (ref 15–41)
Anion gap: 9 (ref 5–15)
BILIRUBIN TOTAL: 0.9 mg/dL (ref 0.3–1.2)
BUN: 32 mg/dL — AB (ref 6–20)
CALCIUM: 9.2 mg/dL (ref 8.9–10.3)
CO2: 27 mmol/L (ref 22–32)
CREATININE: 2.02 mg/dL — AB (ref 0.61–1.24)
Chloride: 105 mmol/L (ref 101–111)
GFR calc Af Amer: 33 mL/min — ABNORMAL LOW (ref >60)
GFR, EST NON AFRICAN AMERICAN: 29 mL/min — AB (ref >60)
GLUCOSE: 109 mg/dL — AB (ref 65–99)
POTASSIUM: 4.5 mmol/L (ref 3.5–5.1)
Sodium: 141 mmol/L (ref 135–145)
TOTAL PROTEIN: 6 g/dL — AB (ref 6.5–8.1)

## 2016-01-13 LAB — CBC
HCT: 31.1 % — ABNORMAL LOW (ref 39.0–52.0)
Hemoglobin: 9.9 g/dL — ABNORMAL LOW (ref 13.0–17.0)
MCH: 28.6 pg (ref 26.0–34.0)
MCHC: 31.8 g/dL (ref 30.0–36.0)
MCV: 89.9 fL (ref 78.0–100.0)
PLATELETS: 238 10*3/uL (ref 150–400)
RBC: 3.46 MIL/uL — ABNORMAL LOW (ref 4.22–5.81)
RDW: 14.7 % (ref 11.5–15.5)
WBC: 9.8 10*3/uL (ref 4.0–10.5)

## 2016-01-13 LAB — URINALYSIS, ROUTINE W REFLEX MICROSCOPIC
BILIRUBIN URINE: NEGATIVE
GLUCOSE, UA: NEGATIVE mg/dL
KETONES UR: NEGATIVE mg/dL
NITRITE: NEGATIVE
PH: 8.5 — AB (ref 5.0–8.0)
Protein, ur: >300 mg/dL — AB
SPECIFIC GRAVITY, URINE: 1.018 (ref 1.005–1.030)

## 2016-01-13 LAB — GLUCOSE, CAPILLARY
GLUCOSE-CAPILLARY: 111 mg/dL — AB (ref 65–99)
GLUCOSE-CAPILLARY: 114 mg/dL — AB (ref 65–99)
GLUCOSE-CAPILLARY: 143 mg/dL — AB (ref 65–99)
Glucose-Capillary: 124 mg/dL — ABNORMAL HIGH (ref 65–99)
Glucose-Capillary: 170 mg/dL — ABNORMAL HIGH (ref 65–99)
Glucose-Capillary: 108 mg/dL — ABNORMAL HIGH (ref 65–99)

## 2016-01-13 LAB — URINE MICROSCOPIC-ADD ON: SQUAMOUS EPITHELIAL / LPF: NONE SEEN

## 2016-01-13 LAB — MAGNESIUM: MAGNESIUM: 1.8 mg/dL (ref 1.7–2.4)

## 2016-01-13 SURGERY — LEFT HEART CATH AND CORONARY ANGIOGRAPHY
Anesthesia: LOCAL

## 2016-01-13 SURGERY — LEFT HEART CATH AND CORONARY ANGIOGRAPHY

## 2016-01-13 MED ORDER — SODIUM CHLORIDE 0.9% FLUSH
3.0000 mL | Freq: Two times a day (BID) | INTRAVENOUS | Status: DC
  Administered 2016-01-13: 3 mL via INTRAVENOUS

## 2016-01-13 MED ORDER — IOPAMIDOL (ISOVUE-370) INJECTION 76%
INTRAVENOUS | Status: DC | PRN
  Administered 2016-01-13: 30 mL via INTRA_ARTERIAL

## 2016-01-13 MED ORDER — SODIUM CHLORIDE 0.9 % IV SOLN
INTRAVENOUS | Status: DC
  Administered 2016-01-13: 16:00:00 via INTRAVENOUS

## 2016-01-13 MED ORDER — INSULIN GLARGINE 100 UNIT/ML ~~LOC~~ SOLN
10.0000 [IU] | Freq: Every day | SUBCUTANEOUS | Status: DC
  Administered 2016-01-13: 10 [IU] via SUBCUTANEOUS
  Filled 2016-01-13 (×2): qty 0.1

## 2016-01-13 MED ORDER — FENTANYL CITRATE (PF) 100 MCG/2ML IJ SOLN
INTRAMUSCULAR | Status: AC
  Filled 2016-01-13: qty 2

## 2016-01-13 MED ORDER — SIMVASTATIN 40 MG PO TABS
40.0000 mg | ORAL_TABLET | Freq: Every day | ORAL | Status: DC
  Administered 2016-01-14: 40 mg via ORAL
  Filled 2016-01-13: qty 1

## 2016-01-13 MED ORDER — LIDOCAINE HCL (PF) 1 % IJ SOLN
INTRAMUSCULAR | Status: AC
  Filled 2016-01-13: qty 30

## 2016-01-13 MED ORDER — HEPARIN (PORCINE) IN NACL 2-0.9 UNIT/ML-% IJ SOLN
INTRAMUSCULAR | Status: AC
  Filled 2016-01-13: qty 1500

## 2016-01-13 MED ORDER — TAMSULOSIN HCL 0.4 MG PO CAPS
0.8000 mg | ORAL_CAPSULE | Freq: Every day | ORAL | Status: DC
  Administered 2016-01-13: 0.8 mg via ORAL
  Filled 2016-01-13: qty 2

## 2016-01-13 MED ORDER — SODIUM CHLORIDE 0.9 % IV SOLN: 250.0000 mL | INTRAVENOUS | Status: DC | PRN

## 2016-01-13 MED ORDER — FENTANYL CITRATE (PF) 100 MCG/2ML IJ SOLN
INTRAMUSCULAR | Status: DC | PRN
  Administered 2016-01-13: 12.5 ug via INTRAVENOUS

## 2016-01-13 MED ORDER — HEPARIN SODIUM (PORCINE) 5000 UNIT/ML IJ SOLN
5000.0000 [IU] | Freq: Three times a day (TID) | INTRAMUSCULAR | Status: DC
  Administered 2016-01-13 – 2016-01-14 (×3): 5000 [IU] via SUBCUTANEOUS
  Filled 2016-01-13 (×3): qty 1

## 2016-01-13 MED ORDER — IOPAMIDOL (ISOVUE-370) INJECTION 76%
INTRAVENOUS | Status: AC
  Filled 2016-01-13: qty 50

## 2016-01-13 MED ORDER — PANTOPRAZOLE SODIUM 40 MG PO TBEC
40.0000 mg | DELAYED_RELEASE_TABLET | Freq: Every day | ORAL | Status: DC
  Administered 2016-01-13 – 2016-01-14 (×2): 40 mg via ORAL
  Filled 2016-01-13 (×2): qty 1

## 2016-01-13 MED ORDER — HEPARIN (PORCINE) IN NACL 2-0.9 UNIT/ML-% IJ SOLN
INTRAMUSCULAR | Status: DC | PRN
  Administered 2016-01-13: 1500 mL

## 2016-01-13 MED ORDER — ACETAMINOPHEN 325 MG PO TABS: 650.0000 mg | ORAL_TABLET | ORAL | Status: DC | PRN

## 2016-01-13 MED ORDER — LIDOCAINE HCL (PF) 1 % IJ SOLN
INTRAMUSCULAR | Status: DC | PRN
  Administered 2016-01-13: 16 mL

## 2016-01-13 MED ORDER — SODIUM CHLORIDE 0.9 % IV SOLN: INTRAVENOUS | Status: AC

## 2016-01-13 MED ORDER — ONDANSETRON HCL 4 MG/2ML IJ SOLN: 4.0000 mg | Freq: Four times a day (QID) | INTRAMUSCULAR | Status: DC | PRN

## 2016-01-13 MED ORDER — SODIUM CHLORIDE 0.9% FLUSH: 3.0000 mL | INTRAVENOUS | Status: DC | PRN

## 2016-01-13 MED ORDER — ATORVASTATIN CALCIUM 40 MG PO TABS
40.0000 mg | ORAL_TABLET | Freq: Every day | ORAL | Status: DC
  Administered 2016-01-13: 40 mg via ORAL
  Filled 2016-01-13: qty 1

## 2016-01-13 MED ORDER — SODIUM CHLORIDE 0.9% FLUSH: 3.0000 mL | Freq: Two times a day (BID) | INTRAVENOUS | Status: DC

## 2016-01-13 MED ORDER — INSULIN ASPART 100 UNIT/ML ~~LOC~~ SOLN
0.0000 [IU] | Freq: Three times a day (TID) | SUBCUTANEOUS | Status: DC
  Administered 2016-01-13 – 2016-01-14 (×2): 2 [IU] via SUBCUTANEOUS
  Administered 2016-01-14: 1 [IU] via SUBCUTANEOUS

## 2016-01-13 MED ORDER — INSULIN ASPART 100 UNIT/ML ~~LOC~~ SOLN: 0.0000 [IU] | Freq: Every day | SUBCUTANEOUS | Status: DC

## 2016-01-13 MED ORDER — IOPAMIDOL (ISOVUE-370) INJECTION 76%
INTRAVENOUS | Status: AC
  Filled 2016-01-13: qty 100

## 2016-01-13 SURGICAL SUPPLY — 6 items
CATH INFINITI 5FR MULTPACK ANG (CATHETERS) ×2 IMPLANT
KIT HEART LEFT (KITS) ×3 IMPLANT
PACK CARDIAC CATHETERIZATION (CUSTOM PROCEDURE TRAY) ×3 IMPLANT
SHEATH PINNACLE 5F 10CM (SHEATH) ×2 IMPLANT
TRANSDUCER W/STOPCOCK (MISCELLANEOUS) ×3 IMPLANT
WIRE EMERALD 3MM-J .035X150CM (WIRE) ×2 IMPLANT

## 2016-01-13 NOTE — Consult Note (Signed)
   [facility] Inpatient Consult   01/13/2016  [name] 1930-11-05 [id number] Patient screened for post hospital follow up with [facility] services. Patient had been previously active with [facility] but staff was having difficulty maintaining contacts per patient outreach notes.  Patient is eligible for [facility] Care Management services under patient's [facility]  plan. Patient's current plan is for skilled nursing facility.  He states his main supporter is his sister, [name].    For [place] [facility] consult or for questions contact:   Natividad Brood, RN BSN [facility] Liaison  [phone] business mobile phone Toll free office [phone]

## 2016-01-13 NOTE — Progress Notes (Signed)
Received from cath lab @ 1746 pm, alert & oriented, B/P 136/61  HR 68    Vpaced ,  O2  sat 100% room air, Denies any pain at this time, Right groin Level O  Mervyn Skeeters, RN

## 2016-01-13 NOTE — H&P (View-Only) (Signed)
Patient Name: [name] Date of Encounter: 01/13/2016  Principal Problem:   Cardiac arrest [facility]) Active Problems:   Acute respiratory failure with hypoxemia ([place])   Acute renal failure (ARF) ([place])   AKI (acute kidney injury) [facility])   Primary Cardiologist: Dr Lovena Le  Patient Profile: 82 y.o. male With PMHx of CAD (last PCI noted 2005), ICM, CHF, DM, HTN, HLD, PVD with left carotid stent 2004, and CRI (baseline Cr 2.0), admitted 04/14 after VF arrest. K+ 6.9, Cr 5.89.  SUBJECTIVE: C/o burning 2nd urinary catheter. C/o pain at ICD site when he coughs. Breathing OK, no chest pain.  OBJECTIVE Filed Vitals:   01/12/16 0400 01/12/16 1500 01/12/16 2245 01/13/16 0443  BP: 140/61 135/66 156/58 131/61  Pulse: 72 74 67 73  Temp: 98.6 F (37 C) 98.4 F (36.9 C) 97.6 F (36.4 C) 98.2 F (36.8 C)  TempSrc: Oral Oral Oral Oral  Resp: 16 18 18 18   Height:      Weight: 161 lb 11.2 oz (73.347 kg)   163 lb 4.8 oz (74.072 kg)  SpO2: 99% 99% 100% 98%    Intake/Output Summary (Last 24 hours) at 01/13/16 0916 Last data filed at 01/13/16 0031  Gross per 24 hour  Intake    840 ml  Output    725 ml  Net    115 ml   Filed Weights   01/11/16 0421 01/12/16 0400 01/13/16 0443  Weight: 163 lb 11.2 oz (74.254 kg) 161 lb 11.2 oz (73.347 kg) 163 lb 4.8 oz (74.072 kg)    PHYSICAL EXAM General: Well developed, well nourished, male in no acute distress. Head: Normocephalic, atraumatic.  Neck: Supple without bruits, JVD 8 cm. Lungs:  Resp regular and unlabored, few rales bases, decreased BS. Heart: RRR, S1, S2, no S3, S4, or murmur; no rub. Abdomen: Soft, non-tender, non-distended, BS + x 4.  Extremities: No clubbing, cyanosis, edema.  Neuro: Alert and oriented X 3. Moves all extremities spontaneously. Psych: Normal affect.  LABS: CBC:  Recent Labs  01/11/16 0240 01/13/16 0218  WBC 12.0* 9.8  NEUTROABS 7.7  --   HGB 9.8* 9.9*  HCT 29.9* 31.1*  MCV 90.3 89.9  PLT 187 [id number]    Basic Metabolic Panel:  Recent Labs  01/12/16 0552 01/13/16 0218  NA 137 141  K 4.2 4.5  CL 101 105  CO2 29 27  GLUCOSE 108* 109*  BUN 37* 32*  CREATININE 2.37* 2.02*  CALCIUM 9.2 9.2  MG  --  1.8   Liver Function Tests:  Recent Labs  01/13/16 0218  AST 30  ALT 23  ALKPHOS 39  BILITOT 0.9  PROT 6.0*  ALBUMIN 2.3*    TELE: SR, +/- A pacing, V pacing.  ** This am, possible brief episode of slow VT       Current Medications:  . ampicillin-sulbactam (UNASYN) IV  1.5 g Intravenous Q12H  . antiseptic oral rinse  7 mL Mouth Rinse BID  . aspirin EC  81 mg Oral Daily  . carvedilol  6.25 mg Oral BID WC  . feeding supplement (ENSURE ENLIVE)  237 mL Oral Q24H  . insulin aspart  0-15 Units Subcutaneous 6 times per day      ASSESSMENT AND PLAN: 1. VF arrest S/p appropriate device therapy Continue to monitor on telemetry  2. Ischemic cardiomyopathy Will need LHC when renal function improves Continue ASA, BB I suspect worsening ischemia is behind his clinical episode - had breakfast this am,  MD advise on scheduling a PM case  3. Acute on Chronic systolic heart failure Euvolemic today, weight is below previous values (under his other MRN) Will need to resume HF meds Increase Coreg to 12.5mg  bid today (home dose 25 mg bid) Continue to hold Lasix, Lisinopril with concerns about renal function  4. AKI Improving Continue to monitor LHC when able - discuss timing with MD  Otherwise, per IM. Contacted them regarding foley pain, they will address.  Principal Problem:   Cardiac arrest [facility]) Active Problems:   Acute respiratory failure with hypoxemia (HCC)   Acute renal failure (ARF) (HCC)   AKI (acute kidney injury) ([place])   Signed, Lenoard Aden 9:16 AM 01/13/2016  EP Attending  Patient seen and examined. Agree with the findings as noted above. Will plan to treat his dysuria and as renal function back down to baseline, proceed with a limited dye  heart cath to define his coronary anatomy. He has not had a heart cath in many years. Will plan to check a U/A. He may need empiric anti-biotics.  Mikle Bosworth.D.

## 2016-01-13 NOTE — Progress Notes (Signed)
No scheduled ICM remote transmission received for 01/11/2016.  Next remote transmission 02/02/2016.

## 2016-01-13 NOTE — Progress Notes (Addendum)
This is a transfer note from [facility] to [facility].   Subjective: 82 yo with hx ischemic CHF EF 20-25%, s/p ICD, CAD s/p MI and stents last 2005, PVD s/p carotid stent 2004, TIA, RBBB, DM II, HTN, chronic mild hyperkalemia, [name] 4 b/l crt ~2-2.5,  was admitted with Vfib cardiac arrest from assisted living on 4/14. He received CPR for 15 mins, AICD interrogation revealed Vfib and AICD delivered shock prior to CPR. He was put on normothermic protolol.  We were asked to take over care starting today.  EP has been following him, waiting for cardiac cath to look for ischemic cause of her cardiac arrest.   Patient denies any chest pain, sob. Having some burning at the catheter site. Had foley cath chronically for urinary retention since hospitalization in 10/2015 for CHF, No fever. Initial UA was negative for UTI.     Objective: Vital signs in last 24 hours: Filed Vitals:   01/12/16 1500 01/12/16 2245 01/13/16 0443 01/13/16 0900  BP: 135/66 156/58 131/61 99/51  Pulse: 74 67 73 80  Temp: 98.4 F (36.9 C) 97.6 F (36.4 C) 98.2 F (36.8 C)   TempSrc: Oral Oral Oral   Resp: 18 18 18 18   Height:      Weight:   163 lb 4.8 oz (74.072 kg)   SpO2: 99% 100% 98%    Weight change: 1 lb 9.6 oz (0.726 kg)  Intake/Output Summary (Last 24 hours) at 01/13/16 1023 Last data filed at 01/13/16 0031  Gross per 24 hour  Intake    600 ml  Output    725 ml  Net   -125 ml   Physical Exam  Constitutional: He is oriented to person, place, and time. He appears well-nourished. No distress.  Elderly man, smiling.   HENT:  Head: Normocephalic and atraumatic.  Eyes: EOM are normal. Pupils are equal, round, and reactive to light.  Neck: Normal range of motion. No JVD present.  Cardiovascular: Normal rate and regular rhythm.   systolic murmur  Respiratory: Effort normal and breath sounds normal. No respiratory distress. He has no wheezes.  GI: Soft. Bowel sounds are normal.  No supra pubic tenderness    Genitourinary:  Has some tenderness when I examined his foley cath site. No discharge or lesions noted. No CVA tenderness.  Musculoskeletal: Normal range of motion. He exhibits no edema or tenderness.  Neurological: He is alert and oriented to person, place, and time. No cranial nerve deficit.  Skin: Skin is warm and dry. He is not diaphoretic.   Lab Results: Basic Metabolic Panel:  Recent Labs Lab 01/08/16 0530 01/09/16 0245 01/10/16 0240  01/12/16 0552 01/13/16 0218  NA 139 133* 134*  < > 137 141  K 5.3* 5.2* 4.6  < > 4.2 4.5  CL 104 96* 96*  < > 101 105  CO2 27 29 29   < > 29 27  GLUCOSE 114* 133* 79  < > 108* 109*  BUN 68* 59* 51*  < > 37* 32*  CREATININE 4.21* 3.70* 3.19*  < > 2.37* 2.02*  CALCIUM 9.8 8.9 8.9  < > 9.2 9.2  MG 2.2  --   --   --   --  1.8  PHOS 2.6 3.0 3.5  --   --   --   < > = values in this interval not displayed. Liver Function Tests:  Recent Labs Lab 01/10/16 0240 01/13/16 0218  AST  --  30  ALT  --  23  ALKPHOS  --  39  BILITOT  --  0.9  PROT  --  6.0*  ALBUMIN 2.6* 2.3*   CBC:  Recent Labs Lab 01/11/16 0240 01/13/16 0218  WBC 12.0* 9.8  NEUTROABS 7.7  --   HGB 9.8* 9.9*  HCT 29.9* 31.1*  MCV 90.3 89.9  PLT 187 238   Cardiac Enzymes:  Recent Labs Lab 01/07/16 1800 01/07/16 2355 01/08/16 0530  TROPONINI 0.15* 0.20* 0.18*   Recent Labs Lab 01/12/16 1128 01/12/16 1649 01/12/16 2240 01/13/16 0028 01/13/16 0439 01/13/16 0810  GLUCAP 165* 155* 122* 111* 114* 124*  Coagulation:  Recent Labs Lab 01/07/16 1220 01/07/16 1800  LABPROT 17.4* 16.5*  INR 1.42 1.32  Urinalysis:  Recent Labs Lab 01/07/16 1837  COLORURINE YELLOW  LABSPEC 1.014  PHURINE 5.5  GLUCOSEU NEGATIVE  HGBUR MODERATE*  BILIRUBINUR NEGATIVE  KETONESUR NEGATIVE  PROTEINUR NEGATIVE  NITRITE NEGATIVE  LEUKOCYTESUR LARGE*   Misc. Labs:   Micro Results: Recent Results (from the past 240 hour(s))  MRSA PCR Screening     Status: None    Collection Time: 01/07/16  5:15 PM  Result Value Ref Range Status   MRSA by PCR NEGATIVE NEGATIVE Final    Comment:        The GeneXpert MRSA Assay (FDA approved for NASAL specimens only), is one component of a comprehensive MRSA colonization surveillance program. It is not intended to diagnose MRSA infection nor to guide or monitor treatment for MRSA infections.    Studies/Results: No results found. Medications: I have reviewed the patient's current medications. Scheduled Meds: . ampicillin-sulbactam (UNASYN) IV  1.5 g Intravenous Q12H  . aspirin EC  81 mg Oral Daily  . carvedilol  6.25 mg Oral BID WC  . feeding supplement (ENSURE ENLIVE)  237 mL Oral Q24H  . insulin aspart  0-15 Units Subcutaneous 6 times per day   Continuous Infusions:  PRN Meds:.acetaminophen, atropine, bisacodyl, docusate sodium, oxyCODONE Assessment/Plan: Principal Problem:   Cardiac arrest ([place]) Active Problems:   AKI (acute kidney injury) ([place])   CAD in native artery   Chronic CHF (congestive heart failure) (HCC)   Cardiomyopathy, ischemic   DM II (diabetes mellitus, type II), controlled ([place])   Urinary retention  Vfib arrest with hx of ischemic cardiomyopathy s/p CPR and AICD shock  - appreciate card input: continue monitoring on tele,  -limited dye heart cath to denite coronary anatomy  Ischemic CHF EF 20-25% s/p AICD - currently appears euvolemic. Hold Lasix for now - continueue coreg 6.25mg  BID per cards - cont asa - was on zocor before but with his CAD he meets criteria for high intensity statin, switched to Lipitor 40mg  daily. - hold lisinopril for now as he had AKi when he came in and he is getting cath soon. May consider restarting later as AKi resolved.  AKI on [name] 4 - resolvedhad AKI when he came in with crt in 5's. His b/l crt is about 2-2.5 Initial rise was likely 2/2 cardiac arrest + lisinopril. Now his crt is back to baseline - continue to monitor. Hold AceI for now, consider  restarting after 48 hours post cardiac cath  Dysuria with the setting of chronic foley for chronic urinary retention- concerning for UTI however UA was only impressive for Leukocytes which could be 2/2 to irritation from the foley. He is not febrile. He did have WBC which could be 2/2 to stress from cardiac arrest.  I think he is having some burning 2/2 to  urinary cath site trauma from multiple in and out caths.  - we will leave the foley in for now as he does have chronic urinary retention. Will repeat UA but may be low yield as he is already on Unasyn.  - asked RN to see if we can apply some lidocaine at the cathether site. - will avoid in and out cath for now as that can further worsen the foley trauma. May consider voiding trial in 1-2 days when he gets a chance to heal - I could not find any urology notes, will need urology follow up outpatient - continue flomax for now.  ?Aspiration pneumonia vs atelectasis - on CXR he had bilateral haziness. Had WBC elevation. Was put on Unasyn yesterday - he is afebrile and which WBC was already coming down before starting Unasyn so I don't think he had aspiration PNA, this is likely 2/2 to atelectasis. Will D/c Unasyn. - monitor clinically. - incentive spirometer  Uncontrolled DM II - last hgba1c on 2/17 was 9.7. - on lantus 30 at home, ordered 10mg  here + SSI   Dispo: Disposition is deferred at this time, awaiting improvement of current medical problems.  Anticipated discharge in approximately 1-2 day(s).   The patient does have a current PCP (No primary care provider on file.) and does need an [facility] hospital follow-up appointment after discharge.  The patient does have transportation limitations that hinder transportation to clinic appointments.  .Services Needed at time of discharge: Y = Yes, Blank = No PT:   OT:   RN:   Equipment:   Other:     LOS: 6 days   Dellia Nims, MD 01/13/2016, 10:23 AM

## 2016-01-13 NOTE — Interval H&P Note (Signed)
Cath Lab Visit (complete for each Cath Lab visit)  Clinical Evaluation Leading to the Procedure:   ACS: Yes.    Non-ACS:    Anginal Classification: CCS IV  Anti-ischemic medical therapy: Minimal Therapy (1 class of medications)  Non-Invasive Test Results: No non-invasive testing performed  Prior CABG: No previous CABG  Episodic VT.  Plan is for diagnostic only unless critical lesion.     History and Physical Interval Note:  01/13/2016 4:17 PM  [name]  has presented today for surgery, with the diagnosis of cp  The various methods of treatment have been discussed with the patient and family. After consideration of risks, benefits and other options for treatment, the patient has consented to  Procedure(s): Left Heart Cath and Coronary Angiography (N/A) as a surgical intervention .  The patient's history has been reviewed, patient examined, no change in status, stable for surgery.  I have reviewed the patient's chart and labs.  Questions were answered to the patient's satisfaction.     Shaurya Rawdon S.

## 2016-01-13 NOTE — Progress Notes (Signed)
Site area: RFA Site Prior to Removal:  Level  0 Pressure Applied For:20 min Manual: yes   Patient Status During Pull:  stable Post Pull Site:  Level 0 Post Pull Instructions Given:  yes Post Pull Pulses Present: palpable Dressing Applied:  tegaderm Bedrest begins @ [id number] till 2130 Comments:

## 2016-01-13 NOTE — Progress Notes (Signed)
Patient Name: [name] Date of Encounter: 01/13/2016  Principal Problem:   Cardiac arrest [facility]) Active Problems:   Acute respiratory failure with hypoxemia ([place])   Acute renal failure (ARF) ([place])   AKI (acute kidney injury) [facility])   Primary Cardiologist: Dr Lovena Le  Patient Profile: 82 y.o. male With PMHx of CAD (last PCI noted 2005), ICM, CHF, DM, HTN, HLD, PVD with left carotid stent 2004, and CRI (baseline Cr 2.0), admitted 04/14 after VF arrest. K+ 6.9, Cr 5.89.  SUBJECTIVE: C/o burning 2nd urinary catheter. C/o pain at ICD site when he coughs. Breathing OK, no chest pain.  OBJECTIVE Filed Vitals:   01/12/16 0400 01/12/16 1500 01/12/16 2245 01/13/16 0443  BP: 140/61 135/66 156/58 131/61  Pulse: 72 74 67 73  Temp: 98.6 F (37 C) 98.4 F (36.9 C) 97.6 F (36.4 C) 98.2 F (36.8 C)  TempSrc: Oral Oral Oral Oral  Resp: 16 18 18 18   Height:      Weight: 161 lb 11.2 oz (73.347 kg)   163 lb 4.8 oz (74.072 kg)  SpO2: 99% 99% 100% 98%    Intake/Output Summary (Last 24 hours) at 01/13/16 0916 Last data filed at 01/13/16 0031  Gross per 24 hour  Intake    840 ml  Output    725 ml  Net    115 ml   Filed Weights   01/11/16 0421 01/12/16 0400 01/13/16 0443  Weight: 163 lb 11.2 oz (74.254 kg) 161 lb 11.2 oz (73.347 kg) 163 lb 4.8 oz (74.072 kg)    PHYSICAL EXAM General: Well developed, well nourished, male in no acute distress. Head: Normocephalic, atraumatic.  Neck: Supple without bruits, JVD 8 cm. Lungs:  Resp regular and unlabored, few rales bases, decreased BS. Heart: RRR, S1, S2, no S3, S4, or murmur; no rub. Abdomen: Soft, non-tender, non-distended, BS + x 4.  Extremities: No clubbing, cyanosis, edema.  Neuro: Alert and oriented X 3. Moves all extremities spontaneously. Psych: Normal affect.  LABS: CBC:  Recent Labs  01/11/16 0240 01/13/16 0218  WBC 12.0* 9.8  NEUTROABS 7.7  --   HGB 9.8* 9.9*  HCT 29.9* 31.1*  MCV 90.3 89.9  PLT 187 99991111    Basic Metabolic Panel:  Recent Labs  01/12/16 0552 01/13/16 0218  NA 137 141  K 4.2 4.5  CL 101 105  CO2 29 27  GLUCOSE 108* 109*  BUN 37* 32*  CREATININE 2.37* 2.02*  CALCIUM 9.2 9.2  MG  --  1.8   Liver Function Tests:  Recent Labs  01/13/16 0218  AST 30  ALT 23  ALKPHOS 39  BILITOT 0.9  PROT 6.0*  ALBUMIN 2.3*    TELE: SR, +/- A pacing, V pacing.  ** This am, possible brief episode of slow VT       Current Medications:  . ampicillin-sulbactam (UNASYN) IV  1.5 g Intravenous Q12H  . antiseptic oral rinse  7 mL Mouth Rinse BID  . aspirin EC  81 mg Oral Daily  . carvedilol  6.25 mg Oral BID WC  . feeding supplement (ENSURE ENLIVE)  237 mL Oral Q24H  . insulin aspart  0-15 Units Subcutaneous 6 times per day      ASSESSMENT AND PLAN: 1. VF arrest S/p appropriate device therapy Continue to monitor on telemetry  2. Ischemic cardiomyopathy Will need LHC when renal function improves Continue ASA, BB I suspect worsening ischemia is behind his clinical episode - had breakfast this am,  MD advise on scheduling a PM case  3. Acute on Chronic systolic heart failure Euvolemic today, weight is below previous values (under his other MRN) Will need to resume HF meds Increase Coreg to 12.5mg  bid today (home dose 25 mg bid) Continue to hold Lasix, Lisinopril with concerns about renal function  4. AKI Improving Continue to monitor LHC when able - discuss timing with MD  Otherwise, per IM. Contacted them regarding foley pain, they will address.  Principal Problem:   Cardiac arrest [facility]) Active Problems:   Acute respiratory failure with hypoxemia (HCC)   Acute renal failure (ARF) (HCC)   AKI (acute kidney injury) ([place])   Signed, Lenoard Aden 9:16 AM 01/13/2016  EP Attending  Patient seen and examined. Agree with the findings as noted above. Will plan to treat his dysuria and as renal function back down to baseline, proceed with a limited dye  heart cath to define his coronary anatomy. He has not had a heart cath in many years. Will plan to check a U/A. He may need empiric anti-biotics.  Mikle Bosworth.D.

## 2016-01-13 NOTE — Progress Notes (Signed)
Physical Therapy Treatment Patient Details Name: [name] MRN: [id number] DOB: 27-Mar-1931 Today's Date: 01/13/2016    History of Present Illness [name] , long term [place] cardiology pt who has known CHF and has AICD in place who was in his usual state of health and at his [facility] when he was found with bradycardia and agonal breathing. He was coded x 15 minutes with CPR. Noted by AICD interrogation he had a Vfib event and AICD delivered shock prior to CPR. Other PMHx-DM, CAD/MI, CHF, PVD    PT Comments    Patient presents with much improved mentation and mobility this session.  Feel he is likely safe for return to ALF with follow up HHPT for safety and balance/mobility in his home environment.  Would continue skilled PT in the acute setting to further facilitate these recommendations.  Follow Up Recommendations  Home health PT (at ALF)     Equipment Recommendations  None recommended by PT    Recommendations for Other Services       Precautions / Restrictions Precautions Precautions: Fall    Mobility  Bed Mobility               General bed mobility comments: up in chair  Transfers Overall transfer level: Needs assistance Equipment used: Rolling walker (2 wheeled) Transfers: Sit to/from Stand Sit to Stand: Min guard         General transfer comment: cues for technique  Ambulation/Gait Ambulation/Gait assistance: Min guard Ambulation Distance (Feet): 300 Feet Assistive device: Rolling walker (2 wheeled) Gait Pattern/deviations: Step-through pattern;Decreased stride length;Trunk flexed     General Gait Details: generally safe and stable even with turning head to greet staff in the hallway, minguard for safety, maneuvers safely around obstacles   Stairs            Wheelchair Mobility    Modified Rankin (Stroke Patients Only)       Balance Overall balance assessment: Needs assistance Sitting-balance support: Bilateral upper extremity  supported;Feet supported Sitting balance-Leahy Scale: Fair Sitting balance - Comments: UE support at times more due to pain at foley site   Standing balance support: Bilateral upper extremity supported Standing balance-Leahy Scale: Poor Standing balance comment: UE support in standing                    Cognition Arousal/Alertness: Awake/alert Behavior During Therapy: WFL for tasks assessed/performed Overall Cognitive Status: Within Functional Limits for tasks assessed                      Exercises      General Comments        Pertinent Vitals/Pain Pain Assessment: Faces Faces Pain Scale: Hurts whole lot Pain Location: at times with bladder spasms and burning at foley site Pain Descriptors / Indicators: Burning Pain Intervention(s): Monitored during session;Limited activity within patient's tolerance;Patient requesting pain meds-RN notified    Home Living                      Prior Function            PT Goals (current goals can now be found in the care plan section) Progress towards PT goals: Progressing toward goals    Frequency  Min 3X/week    PT Plan Discharge plan needs to be updated    Co-evaluation             End of Session Equipment Utilized During Treatment: Gait  belt Activity Tolerance: Patient tolerated treatment well Patient left: in chair;with call bell/phone within reach     Time: [id number] PT Time Calculation (min) (ACUTE ONLY): 24 min  Charges:  $Gait Training: 23-37 mins                    G Codes:      Reginia Naas January 23, 2016, 11:02 AM  Magda Kiel, [place] 2016-01-23

## 2016-01-14 ENCOUNTER — Encounter (HOSPITAL_COMMUNITY): Payer: Self-pay | Admitting: Interventional Cardiology

## 2016-01-14 LAB — BASIC METABOLIC PANEL
Anion gap: 9 (ref 5–15)
BUN: 27 mg/dL — ABNORMAL HIGH (ref 6–20)
CHLORIDE: 106 mmol/L (ref 101–111)
CO2: 25 mmol/L (ref 22–32)
CREATININE: 1.86 mg/dL — AB (ref 0.61–1.24)
Calcium: 9.3 mg/dL (ref 8.9–10.3)
GFR calc Af Amer: 37 mL/min — ABNORMAL LOW (ref >60)
GFR, EST NON AFRICAN AMERICAN: 32 mL/min — AB (ref >60)
Glucose, Bld: 158 mg/dL — ABNORMAL HIGH (ref 65–99)
Potassium: 4.5 mmol/L (ref 3.5–5.1)
Sodium: 140 mmol/L (ref 135–145)

## 2016-01-14 LAB — GLUCOSE, CAPILLARY
Glucose-Capillary: 137 mg/dL — ABNORMAL HIGH (ref 65–99)
Glucose-Capillary: 198 mg/dL — ABNORMAL HIGH (ref 65–99)

## 2016-01-14 MED ORDER — ATORVASTATIN CALCIUM 40 MG PO TABS: 40.0000 mg | ORAL_TABLET | Freq: Every day | ORAL | Status: DC

## 2016-01-14 MED ORDER — GI COCKTAIL ~~LOC~~
30.0000 mL | Freq: Once | ORAL | Status: AC
  Administered 2016-01-14: 30 mL via ORAL
  Filled 2016-01-14: qty 30

## 2016-01-14 NOTE — Progress Notes (Signed)
CARDIAC REHAB PHASE I   PRE:  Rate/Rhythm: 67 pacing    BP: sitting 110/97    SaO2: 100 RA  MODE:  Ambulation: 600 ft   POST:  Rate/Rhythm: 76 pacing    BP: sitting 126/50     SaO2: 100 RA  Pt had slight posterior lean getting up, able to stand with min assist. Used RW, steady entire walk. Wanted to do 2 laps. Return to recliner.  [place], ACSM 01/14/2016 1:29 PM

## 2016-01-14 NOTE — Care Management Note (Signed)
Case Management Note Marvetta Gibbons RN, BSN Unit 2W-Case Manager [phone]  Patient Details  Name: [name] MRN: [id number] Date of Birth: Nov 20, 1930  Subjective/Objective:  Pt admitted s/p cardiac arrest- tx to 2w from ICU                  Action/Plan: PTA pt [place]- was active with [facility]- recommendations where made for SNF for rehab- CSW was working on placement- however received call from [place] that pt's insurance had denied STSNF - pt ambulating 300 pt min assist. - updated PT notes from today 4/21- recommending [place]-  Have spoken with pt and his sister- who are not happy with insurance decision but agreeable to d/c back home with [facility] - per conversation with sister- would like to resume [place] with [facility] - per pt he has all needed DME - sister to come provide transportation. Call made to [facility] with [facility]- to resume HH-RN and add- PT/aide/sw  Expected Discharge Date:    01/14/16              Expected Discharge Plan:  Skilled Nursing Facility  In-House Referral:  Clinical Social Work  Discharge planning Services  CM Consult  Post Acute Care Choice:  Home Health, Resumption of Svcs/PTA Provider Choice offered to:  Patient, Sibling  DME Arranged:    DME Agency:     HH Arranged:  PT, Nurse's Aide, RN/CSW [place] Agency:  [place]  Status of Service:  Completed, signed off  Medicare Important Message Given:  Yes Date Medicare IM Given:    Medicare IM give by:    Date Additional Medicare IM Given:    Additional Medicare Important Message give by:     If discussed at [place] of Stay Meetings, dates discussed:    Additional Comments:  Dawayne Patricia, RN 01/14/2016, 2:36 PM

## 2016-01-14 NOTE — Progress Notes (Signed)
Patient Name: [name] Date of Encounter: 01/14/2016  Principal Problem:   Cardiac arrest [facility]) Active Problems:   AKI (acute kidney injury) ([place])   CAD in native artery   Chronic CHF (congestive heart failure) (HCC)   Cardiomyopathy, ischemic   DM II (diabetes mellitus, type II), controlled [facility])   Urinary retention   Dysuria   Primary Cardiologist: Dr Lovena Le  Patient Profile: 82 y.o. Male with PMHx of CAD (last PCI noted 2005), ICM, CHF, DM, HTN, HLD, PVD with left carotid stent 2004, and CRI (baseline Cr 2.0), admitted 04/14 after VF arrest. K+ 6.9, Cr 5.89. Cath 04/20 when Cr back to baseline  SUBJECTIVE: "I feel good". Denies chest pain or sob.   OBJECTIVE Filed Vitals:   01/13/16 1845 01/13/16 1902 01/13/16 2055 01/14/16 0333  BP: 159/72 159/59 141/51 127/59  Pulse: 68 67 72 73  Temp:   99.4 F (37.4 C) 98.5 F (36.9 C)  TempSrc:   Oral Oral  Resp:  18 18 18   Height:      Weight:    155 lb (70.308 kg)  SpO2:   98% 97%    Intake/Output Summary (Last 24 hours) at 01/14/16 0906 Last data filed at 01/14/16 0535  Gross per 24 hour  Intake    223 ml  Output   1075 ml  Net   -852 ml   Filed Weights   01/12/16 0400 01/13/16 0443 01/14/16 0333  Weight: 161 lb 11.2 oz (73.347 kg) 163 lb 4.8 oz (74.072 kg) 155 lb (70.308 kg)    PHYSICAL EXAM General: Well developed, well nourished, male in no acute distress. Head: Normocephalic, atraumatic.  Neck: Supple without bruits, JVD. Lungs:  Resp regular and unlabored, CTA. Heart: RRR, S1, S2, no S3, S4, or murmur; no rub. Abdomen: Soft, non-tender, non-distended, BS + x 4.  Extremities: No clubbing, cyanosis, edema.  Neuro: Alert and oriented X 3. Moves all extremities spontaneously. Psych: Normal affect.  LABS: CBC: Recent Labs  01/13/16 0218  WBC 9.8  HGB 9.9*  HCT 31.1*  MCV 89.9  PLT 99991111   Basic Metabolic Panel: Recent Labs  01/13/16 0218 01/14/16 0320  NA 141 140  K 4.5 4.5  CL 105  106  CO2 27 25  GLUCOSE 109* 158*  BUN 32* 27*  CREATININE 2.02* 1.86*  CALCIUM 9.2 9.3  MG 1.8  --    Liver Function Tests: Recent Labs  01/13/16 0218  AST 30  ALT 23  ALKPHOS 39  BILITOT 0.9  PROT 6.0*  ALBUMIN 2.3*    TELE:      nsr with ventricular pacing  Cath: 04/20   Mid Cx to Dist Cx , 40 % instent restenosis.  Chronically occluded LAD. Left to left collaterals.  Mid RCA lesion, 50% stenosed.  Mildly increased LVEDP. No significant change from 2009 cath. Circumflex stent still patent with mild instent restenosis. Continue medical therapy. Gentle hydration post cath to preserve renal function.   Radiology/Studies: No results found.   Current Medications:  . aspirin EC  81 mg Oral Daily  . atorvastatin  40 mg Oral q1800  . carvedilol  6.25 mg Oral BID WC  . feeding supplement (ENSURE ENLIVE)  237 mL Oral Q24H  . heparin  5,000 Units Subcutaneous Q8H  . insulin aspart  0-5 Units Subcutaneous QHS  . insulin aspart  0-9 Units Subcutaneous TID WC  . insulin glargine  10 Units Subcutaneous QHS  . pantoprazole  40  mg Oral Daily  . simvastatin  40 mg Oral Daily  . sodium chloride flush  3 mL Intravenous Q12H  . tamsulosin  0.8 mg Oral QHS      ASSESSMENT AND PLAN: 1. VF arrest S/p appropriate device therapy Continue to monitor on telemetry He is stable for discharge at this point.   2. Ischemic cardiomyopathy LHC with occluded LAD, med rx Continue ASA, BB Amazingly his CAD has not progressed in 8 years  3. Acute on Chronic systolic heart failure Euvolemic today, weight is below previous values (under his other MRN) Will need to resume HF meds Increase Coreg to 12.5mg  bid today (home dose 25 mg bid) Continue to hold Lasix, Lisinopril with concerns about renal function for 24 hours Would resume both at low doses, will arrange early cards f/u  4. AKI Improving Continue to monitor Per IM He is approaching his baseline.  Otherwise, per  IM, no further cardiac workup  Principal Problem:   Cardiac arrest [facility]) Active Problems:   AKI (acute kidney injury) ([place])   CAD in native artery   Chronic CHF (congestive heart failure) ([place])   Cardiomyopathy, ischemic   DM II (diabetes mellitus, type II), controlled [facility])   Urinary retention   Dysuria   Signed, Rosaria Ferries , PA-C 9:06 AM 01/14/2016  EP attending   Patient seen and examined. Agree with the findings as noted above with minimal modification. He is stable for discharge. I'll defer decision on CIR vs SNF to primary team. We will arrange followup.  Mikle Bosworth.D.

## 2016-01-14 NOTE — Clinical Social Work Note (Addendum)
Insurance has denied patient for SNF stay. Patient will discharge home. CSW signing off at this time.   Liz Beach MSW, [place], [place], [phone]

## 2016-01-14 NOTE — Progress Notes (Signed)
  Date: 01/14/2016  Patient name: ONIEL KAREN  Medical record number: [id number]  Date of birth: [birth year]   This patient has been seen and the plan of care was discussed with the house staff. Please see their note for complete details. I concur with their findings with the following additions/corrections: Mr Dankenbring was transferred to our service after V fib arrest. Cardiac wise is stable. Denied pain assoc with [facility]. Insurance denied SNF stay. PT rec home PT with intermittent supervision. Possible D/C today. outpt urology F/U  Bartholomew Crews, MD 01/14/2016, 2:53 PM

## 2016-01-14 NOTE — Care Management Important Message (Signed)
Important Message  Patient Details  Name: [name] MRN: [id number] Date of Birth: [birth year]   Medicare Important Message Given:  Yes    Nathen May 01/14/2016, 10:28 AM

## 2016-01-14 NOTE — Clinical Social Work Note (Signed)
Per patient's request CSW provided patient with information on [employer]. CSW signing off.   [employer], LCSW [phone]

## 2016-01-14 NOTE — Discharge Summary (Signed)
Name: [name] MRN: [id number] DOB: 1931-09-05 82 y.o. PCP: No primary care provider on file.  Date of Admission: 01/07/2016 11:56 AM Date of Discharge: 01/14/2016 Attending Physician: Bartholomew Crews, MD  Discharge Diagnosis: Principal Problem VFib Arrest with Hx Ischemic Cardiomyopathy s/p CPR and AICD Shock Active Problems: Ischemic CHF, EF 20-25% s/p AICD AKI on [name] 4 Dysuria in setting of chronic foley Atelectasis Uncontrolled type 2 DM  Discharge Medications:   Medication List    STOP taking these medications        simvastatin 40 MG tablet  Commonly known as:  ZOCOR      TAKE these medications        albuterol 108 (90 Base) MCG/ACT inhaler  Commonly known as:  PROVENTIL HFA;VENTOLIN HFA  Inhale 1-2 puffs into the lungs every 4 (four) hours as needed for wheezing or shortness of breath.     aspirin EC 81 MG tablet  Take 81 mg by mouth daily.     atorvastatin 40 MG tablet  Commonly known as:  LIPITOR  Take 1 tablet (40 mg total) by mouth daily at 6 PM.     carvedilol 25 MG tablet  Commonly known as:  COREG  Take 25 mg by mouth 2 (two) times daily with a meal.     furosemide 40 MG tablet  Commonly known as:  LASIX  Take 40 mg by mouth 2 (two) times daily.     insulin glargine 100 UNIT/ML injection  Commonly known as:  LANTUS  Inject 30 Units into the skin at bedtime.     lisinopril 5 MG tablet  Commonly known as:  PRINIVIL,ZESTRIL  Take 5 mg by mouth daily.     pantoprazole 40 MG tablet  Commonly known as:  PROTONIX  Take 40 mg by mouth daily.     tamsulosin 0.4 MG Caps capsule  Commonly known as:  FLOMAX  Take 0.8 mg by mouth at bedtime.        Disposition and follow-up:   [name] was discharged from [facility] in Good condition.  At the hospital follow up visit please address:  1.  CHF- Discharged on home lasix 40 mg BID. Check his volume status and repeat bmet at outpatient visit. Adjust lasix  accordingly. AKI on [name] 4: Holding Lisinopril 48 hours post cath (can resume 4/23). Will need repeat bmet.  Dysuria w/ chronic foley: scheduled to see urology outpatient. Dysuria resolved by time of discharge.  DM- Assess blood sugars and adjust insulin accordingly  2.  Labs / imaging needed at time of follow-up: bmet  3.  Pending labs/ test needing follow-up: None  Follow-up Appointments: Follow-up Information    Follow up with [facility] On 01/21/2016.   Specialty:  Cardiology   Why:  Please arrive at 8:15 am for an 8:30 appointment with Rosaria Ferries, PA-C   Contact information:   [address] [place] [phone]      Follow up with [facility] Go on 01/18/2016.   Why:  Follow up appointment at 11 a.m.    Contact information:   [address]  [place] [address] [place] [address] [phone]       Follow up with Albin Felling, MD. Go on 01/27/2016.   Specialty:  Internal Medicine   Why:  Follow up appointment at 10:15 a.m.    Contact information:   [place] [address]  [phone]       Discharge Instructions: Discharge Instructions    Diet - low sodium heart healthy    Complete by:  As directed      Increase activity slowly    Complete by:  As directed            Consultations: Treatment Team:  Rounding Lbcardiology, MD  Procedures Performed:  Ct Head Wo Contrast  01/07/2016  CLINICAL DATA:  Cardiac arrest. EXAM: CT HEAD WITHOUT CONTRAST TECHNIQUE: Contiguous axial images were obtained from the base of the skull through the vertex without intravenous contrast. COMPARISON:  None. FINDINGS: Brain: No evidence of an acute infarct, acute hemorrhage, mass lesion, mass effect or hydrocephalus. Atrophy. Moderate. Moderate periventricular low attenuation. Apparent low-attenuation in the left basal ganglia (series 2, image 15), possibly due to volume averaging with the adjacent ventricle.  Vascular: No hyperdense vessel or unexpected calcification. Skull: Negative for fracture or focal lesion. Sinuses/Orbits: Scattered mucosal thickening in the paranasal sinuses. No air-fluid levels. Mastoid air cells are clear. Other: None. IMPRESSION: 1. No acute intracranial abnormality. 2. Atrophy and chronic microvascular white matter ischemic changes. Electronically Signed   By: [name] M.D.   On: 01/07/2016 17:10   Dg Chest Port 1 View  01/09/2016  CLINICAL DATA:  82 year old male with history of acute respiratory failure. EXAM: PORTABLE CHEST 1 VIEW COMPARISON:  Chest x-ray 01/08/2016. FINDINGS: An endotracheal tube is in place with tip 3.7 cm above the carina. A nasogastric tube is seen extending into the stomach, however, the tip of the nasogastric tube extends below the lower margin of the image. Left-sided biventricular pacemaker/AICD with lead tips projecting over the expected location of the right atrium, right ventricular apex and overlying the left ventricle via the coronary sinus and coronary veins. Lung volumes are normal. Patchy opacities are noted throughout the mid to lower lungs bilaterally, which are new compared to the prior study. Probable small left pleural effusion. Mild cardiomegaly. Upper mediastinal contours are within normal limits. Atherosclerosis in the thoracic aorta. IMPRESSION: 1. Support apparatus, as above. 2. Interval development of patchy ill-defined opacities throughout the mid to lower lungs bilaterally, concerning for sequela of aspiration or developing infection. Some component of underlying atelectasis is also likely present. 3. Probable small left pleural effusion. 4. Mild cardiomegaly. Electronically Signed   By: Vinnie Langton M.D.   On: 01/09/2016 07:49   Dg Chest Port 1 View  01/08/2016  CLINICAL DATA:  82 year old male with acute respiratory failure EXAM: PORTABLE CHEST 1 VIEW COMPARISON:  Prior chest x-ray 01/07/2016 FINDINGS: The endotracheal tube is  2.4 cm above the carina. A nasogastric tube is present. The tip is coiled in the gastric fundus. Left subclavian approach biventricular cardiac rhythm maintenance device. Leads project over the right atrium, right ventricle and overlying the left ventricle. External defibrillator pads project over the chest. Stable cardiomegaly with left heart enlargement. Metallic stent projects over the coronary artery. No pneumothorax, pleural effusion or pulmonary edema. Persistently low inspiratory volumes. IMPRESSION: 1. Interval placement of a nasogastric tube which is coiled in the gastric fundus. 2. Otherwise, stable and satisfactory support apparatus. 3. Persistent low inspiratory volumes with perhaps mild bibasilar atelectasis. 4. Cardiomegaly without evidence of failure. Electronically Signed   By: Jacqulynn Cadet M.D.   On: 01/08/2016 07:11   Dg Chest Portable 1 View  01/07/2016  CLINICAL DATA:  Intubation after cardiac arrest EXAM: PORTABLE CHEST 1 VIEW COMPARISON:  None. FINDINGS: Endotracheal tube tip is halfway between the  expected location of the carina and the clavicular heads. Cardiomegaly with biventricular ICD/ pacer and coronary stent. Negative aortic and hilar contours. Left basilar density is likely from the cardiomegaly. There is no edema, consolidation, effusion, or pneumothorax. Asymmetric density at the right apex is likely osseous. Anticipate follow-up radiographs. No visible rib fracture. IMPRESSION: 1. Unremarkable positioning of the endotracheal tube. 2. Cardiomegaly without failure.  Biventricular ICD/pacer. 3. Asymmetric density at the right apex is favored osseous. After convalescence recommend two-view study. Electronically Signed   By: Monte Fantasia M.D.   On: 01/07/2016 12:55   Dg Abd Portable 1v  01/07/2016  CLINICAL DATA:  Patient status post OG tube placement. EXAM: PORTABLE ABDOMEN - 1 VIEW COMPARISON:  Abdominal radiograph 01/07/2016 FINDINGS: Enteric tube tip and side-port  project over the stomach. Nonobstructed bowel gas pattern. Lumbar spine degenerative changes. IMPRESSION: Enteric tube tip and side-port project over the stomach. Electronically Signed   By: Lovey Newcomer M.D.   On: 01/07/2016 20:28   Dg Abd Portable 1v  01/07/2016  CLINICAL DATA:  Feeding tube placement EXAM: PORTABLE ABDOMEN - 1 VIEW COMPARISON:  None. FINDINGS: Feeding tube appears coiled in the esophagus. Tip not seen. Bowel gas pattern normal. IMPRESSION: Feeding tube coiled in esophagus with tip not seen. Bowel gas pattern normal. These results will be called to the ordering clinician or representative by the Radiologist Assistant, and communication documented in the PACS or zVision Dashboard. Electronically Signed   By: Lowella Grip III M.D.   On: 01/07/2016 18:53    2D Echo:  01/09/16 Study Conclusions - Left ventricle: The cavity size was normal. Wall thickness was  normal. Systolic function was moderately to severely reduced. The  estimated ejection fraction was in the range of 30% to 35%.  Akinesis of the mid-apicalanteroseptal, anterior, inferior, and  apical myocardium; consistent with infarction in the distribution  of the left anterior descending coronary artery. Doppler  parameters are consistent with abnormal left ventricular  relaxation (grade 1 diastolic dysfunction). Acoustic contrast  opacification revealed no evidence ofthrombus. - Ventricular septum: Septal motion showed paradox. These changes  are consistent with right ventricular pacing. - Aortic valve: There was mild stenosis. Valve area (VTI): 1.26  cm^2. Valve area (Vmax): 1.41 cm^2. Valve area (Vmean): 1.25  cm^2. - Pericardium, extracardiac: A trivial pericardial effusion was  identified posterior to the heart.  Cardiac Cath:  01/13/16  Mid Cx to Dist Cx , 40 % instent restenosis.  Chronically occluded LAD. Left to left collaterals.  Mid RCA lesion, 50% stenosed.  Mildly increased  LVEDP.   No significant change from 2009 cath. Circumflex stent still patent with mild instent restenosis. Continue medical therapy. Gentle hydration post cath to preserve renal function.   Admission HPI: [name] , long term [place] cardiology pt who has known CHF and has AICD in place who was in his usual state of health and at his [facility] when he was found with bradycardia and agonal breathing. He was coded x 15 minutes with CPR. Noted by AICD interrogation he had a Vfib event and AICD delivered shock prior to CPR. He will admitted and undergo normothermic protocol. Cardiology is aware of this admission.  Hospital Course by problem list:   VFib Arrest in Setting of Ischemic Cardiomyopathy s/p CPR with AICD Shock: He was admitted after having a VFib cardiac arrest at his ALF on 4/14. He received CPR for 15 minutes and upon interrogation of his AICD was found to have been shocked  prior to CPR. He was placed on normothermic protocol and did well. He underwent cardiac cath on 4/20 which revealed no changes from his prior cath in 2009. He was doing well by time of discharge. He was discharged to SNF on his home Lasix, ASA, Atorvastatin, and Coreg. He was instructed to resume his home Lisinopril on 4/23 since he received contrast during his cardiac cath late on 4/20. He will have follow up in the [facility] on 5/4.   Discharge Vitals:   BP 129/65 mmHg  Pulse 67  Temp(Src) 98.5 F (36.9 C) (Oral)  Resp 18  Ht 5\' 7"  (1.702 m)  Wt 155 lb (70.308 kg)  BMI 24.27 kg/m2  SpO2 100% Physical Exam  Constitutional: He is oriented to person, place, and time. He appears well-nourished. No distress.  Elderly man, smiling.  HENT:  Head: Normocephalic and atraumatic.  Eyes: EOM are normal. Pupils are equal, round, and reactive to light.  Neck: Normal range of motion. No JVD present.  Cardiovascular: Normal rate and regular rhythm.  systolic murmur  Respiratory: Effort normal and breath sounds normal. No  respiratory distress. He has no wheezes.  GI: Soft. Bowel sounds are normal.  No supra pubic tenderness  Genitourinary:  Has some tenderness when I examined his foley cath site. No discharge or lesions noted. No CVA tenderness.  Musculoskeletal: Normal range of motion. He exhibits no edema or tenderness.  Neurological: He is alert and oriented to person, place, and time. No cranial nerve deficit.  Skin: Skin is warm and dry. He is not diaphoretic.   Discharge Labs:  Results for orders placed or performed during the hospital encounter of 01/07/16 (from the past 24 hour(s))  Urinalysis, Routine w reflex microscopic (not at [facility])     Status: Abnormal   Collection Time: 01/13/16  2:48 PM  Result Value Ref Range   Color, Urine YELLOW YELLOW   APPearance CLEAR CLEAR   Specific Gravity, Urine 1.018 1.005 - 1.030   pH 8.5 (H) 5.0 - 8.0   Glucose, UA NEGATIVE NEGATIVE mg/dL   Hgb urine dipstick SMALL (A) NEGATIVE   Bilirubin Urine NEGATIVE NEGATIVE   Ketones, ur NEGATIVE NEGATIVE mg/dL   Protein, ur >300 (A) NEGATIVE mg/dL   Nitrite NEGATIVE NEGATIVE   Leukocytes, UA SMALL (A) NEGATIVE  Urine microscopic-add on     Status: Abnormal   Collection Time: 01/13/16  2:48 PM  Result Value Ref Range   Squamous Epithelial / LPF NONE SEEN NONE SEEN   WBC, UA 6-30 0 - 5 WBC/hpf   RBC / HPF 6-30 0 - 5 RBC/hpf   Bacteria, UA RARE (A) NONE SEEN   Casts HYALINE CASTS (A) NEGATIVE  Glucose, capillary     Status: Abnormal   Collection Time: 01/13/16  5:07 PM  Result Value Ref Range   Glucose-Capillary 108 (H) 65 - 99 mg/dL  Glucose, capillary     Status: Abnormal   Collection Time: 01/13/16  9:08 PM  Result Value Ref Range   Glucose-Capillary 143 (H) 65 - 99 mg/dL  Basic metabolic panel     Status: Abnormal   Collection Time: 01/14/16  3:20 AM  Result Value Ref Range   Sodium 140 135 - 145 mmol/L   Potassium 4.5 3.5 - 5.1 mmol/L   Chloride 106 101 - 111 mmol/L   CO2 25 22 - 32 mmol/L   Glucose,  Bld 158 (H) 65 - 99 mg/dL   BUN 27 (H) 6 - 20 mg/dL  Creatinine, Ser 1.86 (H) 0.61 - 1.24 mg/dL   Calcium 9.3 8.9 - 10.3 mg/dL   GFR calc non Af Amer 32 (L) >60 mL/min   GFR calc Af Amer 37 (L) >60 mL/min   Anion gap 9 5 - 15  Glucose, capillary     Status: Abnormal   Collection Time: 01/14/16  6:24 AM  Result Value Ref Range   Glucose-Capillary 137 (H) 65 - 99 mg/dL  Glucose, capillary     Status: Abnormal   Collection Time: 01/14/16 11:46 AM  Result Value Ref Range   Glucose-Capillary 198 (H) 65 - 99 mg/dL    Signed: Juliet Rude, MD 01/14/2016, 1:48 PM    Services Ordered on Discharge: None Equipment Ordered on Discharge: None

## 2016-01-14 NOTE — Progress Notes (Signed)
Pt being discharged home via wheelchair with family. Pt alert and oriented x4. VSS. Pt c/o no pain at this time. No signs of respiratory distress. Education complete and care plans resolved. IV removed with catheter intact and pt tolerated well. Pt sent home with chronic foley usage.Pt c/o coughing "chest pain". GI cocktail given and relief obtained. No further issues at this time. Pt to follow up with PCP. Leanne Chang, RN

## 2016-01-14 NOTE — Progress Notes (Signed)
Subjective: No acute overnight events. Patient denies any CP or SOB. Denies having any dysuria. No other complaints.    Objective: Vital signs in last 24 hours: Filed Vitals:   01/13/16 1902 01/13/16 2055 01/14/16 0333 01/14/16 1012  BP: 159/59 141/51 127/59 129/65  Pulse: 67 72 73 67  Temp:  99.4 F (37.4 C) 98.5 F (36.9 C)   TempSrc:  Oral Oral   Resp: 18 18 18    Height:      Weight:   70.308 kg (155 lb)   SpO2:  98% 97% 100%   Weight change: -3.765 kg (-8 lb 4.8 oz)  Intake/Output Summary (Last 24 hours) at 01/14/16 1303 Last data filed at 01/14/16 0535  Gross per 24 hour  Intake      3 ml  Output    675 ml  Net   -672 ml   Physical Exam  Constitutional: He is oriented to person, place, and time. No distress.  HENT:  Head: Normocephalic and atraumatic.  Eyes: EOM are normal.  Neck: Normal range of motion. No JVD present.  Cardiovascular: Normal rate, regular rhythm and intact distal pulses.   systolic murmur  Respiratory: Effort normal and breath sounds normal. No respiratory distress. He has no wheezes. He has no rales.  GI: Soft. Bowel sounds are normal. He exhibits no distension. There is no tenderness.  No supra pubic tenderness  Musculoskeletal: He exhibits no edema or tenderness.  Neurological: He is alert and oriented to person, place, and time.  Skin: Skin is warm and dry. He is not diaphoretic.   Lab Results: Basic Metabolic Panel:  Recent Labs Lab 01/08/16 0530 01/09/16 0245 01/10/16 0240  01/13/16 0218 01/14/16 0320  NA 139 133* 134*  < > 141 140  K 5.3* 5.2* 4.6  < > 4.5 4.5  CL 104 96* 96*  < > 105 106  CO2 27 29 29   < > 27 25  GLUCOSE 114* 133* 79  < > 109* 158*  BUN 68* 59* 51*  < > 32* 27*  CREATININE 4.21* 3.70* 3.19*  < > 2.02* 1.86*  CALCIUM 9.8 8.9 8.9  < > 9.2 9.3  MG 2.2  --   --   --  1.8  --   PHOS 2.6 3.0 3.5  --   --   --   < > = values in this interval not displayed. Liver Function Tests:  Recent Labs Lab  01/10/16 0240 01/13/16 0218  AST  --  30  ALT  --  23  ALKPHOS  --  39  BILITOT  --  0.9  PROT  --  6.0*  ALBUMIN 2.6* 2.3*   CBC:  Recent Labs Lab 01/11/16 0240 01/13/16 0218  WBC 12.0* 9.8  NEUTROABS 7.7  --   HGB 9.8* 9.9*  HCT 29.9* 31.1*  MCV 90.3 89.9  PLT 187 238   Cardiac Enzymes:  Recent Labs Lab 01/07/16 1800 01/07/16 2355 01/08/16 0530  TROPONINI 0.15* 0.20* 0.18*    Recent Labs Lab 01/13/16 0810 01/13/16 1126 01/13/16 1707 01/13/16 2108 01/14/16 0624 01/14/16 1146  GLUCAP 124* 170* 108* 143* 137* 198*  Coagulation:  Recent Labs Lab 01/07/16 1800  LABPROT 16.5*  INR 1.32  Urinalysis:  Recent Labs Lab 01/07/16 1837 01/13/16 1448  COLORURINE YELLOW YELLOW  LABSPEC 1.014 1.018  PHURINE 5.5 8.5*  GLUCOSEU NEGATIVE NEGATIVE  HGBUR MODERATE* SMALL*  BILIRUBINUR NEGATIVE NEGATIVE  KETONESUR NEGATIVE NEGATIVE  PROTEINUR NEGATIVE >300*  NITRITE NEGATIVE  NEGATIVE  LEUKOCYTESUR LARGE* SMALL*   Misc. Labs:   Micro Results: Recent Results (from the past 240 hour(s))  MRSA PCR Screening     Status: None   Collection Time: 01/07/16  5:15 PM  Result Value Ref Range Status   MRSA by PCR NEGATIVE NEGATIVE Final    Comment:        The GeneXpert MRSA Assay (FDA approved for NASAL specimens only), is one component of a comprehensive MRSA colonization surveillance program. It is not intended to diagnose MRSA infection nor to guide or monitor treatment for MRSA infections.    Studies/Results: No results found. Medications: I have reviewed the patient's current medications. Scheduled Meds: . aspirin EC  81 mg Oral Daily  . atorvastatin  40 mg Oral q1800  . carvedilol  6.25 mg Oral BID WC  . feeding supplement (ENSURE ENLIVE)  237 mL Oral Q24H  . heparin  5,000 Units Subcutaneous Q8H  . insulin aspart  0-5 Units Subcutaneous QHS  . insulin aspart  0-9 Units Subcutaneous TID WC  . insulin glargine  10 Units Subcutaneous QHS  .  pantoprazole  40 mg Oral Daily  . simvastatin  40 mg Oral Daily  . sodium chloride flush  3 mL Intravenous Q12H  . tamsulosin  0.8 mg Oral QHS   Continuous Infusions:  PRN Meds:.sodium chloride, acetaminophen, acetaminophen, bisacodyl, docusate sodium, ondansetron (ZOFRAN) IV, oxyCODONE, sodium chloride flush Assessment/Plan: Principal Problem:   Cardiac arrest ([place]) Active Problems:   AKI (acute kidney injury) ([place])   CAD in native artery   Chronic CHF (congestive heart failure) ([facility])   Cardiomyopathy, ischemic   DM II (diabetes mellitus, type II), controlled ([place])   Urinary retention   Dysuria  Vfib arrest with hx of ischemic cardiomyopathy s/p CPR and AICD shock  -appreciate card input: continue monitoring on tele -limited dye heart cath to denite coronary anatomy  Ischemic CHF EF 20-25% s/p AICD -resume home lasix  -resume home Lisinopril  -continueue coreg 6.25mg  BID per cards -cont asa -Lipitor 40mg  daily  AKI on [name] 4 (resolved) - AKI when he came in with crt in 5's. His b/l crt is about 2-2.5. Initial rise was likely 2/2 cardiac arrest + lisinopril. Scr 1.8 now.  -resume home lasix -resume home Lisinopril   Chronic foley for chronic urinary retention- He is not febrile. Leukocytosis has resolved. Repeat UA showing small amount of leukocytes but nitrite negative. Patient denies having any dysuria at present. No suprapubic tenderness on exam.  -Leave the foley in for now as he does have chronic urinary retention.  -Patient needs to follow-up with urology as outpatient.  -continue flomax for now  Uncontrolled DM II - last hgba1c on 2/17 was 9.7. - on lantus 30 at home, ordered 10mg  here + SSI  Dispo: Patient is stable and will likely be discharged to home today.   The patient does have a current PCP (No primary care provider on file.) and does need an [facility] hospital follow-up appointment after discharge.  The patient does have transportation limitations that hinder  transportation to clinic appointments.  .Services Needed at time of discharge: Y = Yes, Blank = No PT:   OT:   RN:   Equipment:   Other:     LOS: 7 days   Shela Leff, MD 01/14/2016, 1:03 PM

## 2016-01-14 NOTE — Progress Notes (Signed)
Physical Therapy Treatment Patient Details Name: [name] MRN: [id number] DOB: March 11, 1931 Today's Date: 01/14/2016    History of Present Illness [name] , long term [facility] cardiology pt who has known CHF and has AICD in place who was in his usual state of health and at his [facility] when he was found with bradycardia and agonal breathing. He was coded x 15 minutes with CPR. Noted by AICD interrogation he had a Vfib event and AICD delivered shock prior to CPR. Other PMHx-DM, CAD/MI, CHF, PVD    PT Comments    Pt moving well and without A.  Question if this is near pt's baseline level of function.  Pt does reside at a [facility], not an ALF as previously thought.  Pt would benefit from [facility] to A with ADLs and homemaking tasks at D/C along with HHPT for home safety eval.  Will continue to follow if remains on acute.    Follow Up Recommendations  Home health PT;Supervision - Intermittent ([facility])     Equipment Recommendations  None recommended by PT    Recommendations for Other Services       Precautions / Restrictions Precautions Precautions: Fall Restrictions Weight Bearing Restrictions: No    Mobility  Bed Mobility               General bed mobility comments: pt sitting in recliner  Transfers Overall transfer level: Needs assistance Equipment used: Rolling walker (2 wheeled) Transfers: Sit to/from Stand Sit to Stand: Supervision         General transfer comment: pt able to perform without A, but does require increased time.  S for safety.    Ambulation/Gait Ambulation/Gait assistance: Supervision Ambulation Distance (Feet): 300 Feet Assistive device: Rolling walker (2 wheeled) Gait Pattern/deviations: Step-through pattern;Decreased stride length;Trunk flexed     General Gait Details: pt demonstrates good safety with mobility.  pt able to start and stop, perform head turns, and negotiate around obstacles during ambulation.     Stairs            Wheelchair Mobility    Modified Rankin (Stroke Patients Only)       Balance Overall balance assessment: Needs assistance Sitting-balance support: No upper extremity supported;Feet supported Sitting balance-Leahy Scale: Good     Standing balance support: Bilateral upper extremity supported;Single extremity supported;During functional activity Standing balance-Leahy Scale: Fair                      Cognition Arousal/Alertness: Awake/alert Behavior During Therapy: WFL for tasks assessed/performed Overall Cognitive Status: Within Functional Limits for tasks assessed                      Exercises      General Comments        Pertinent Vitals/Pain Pain Assessment: No/denies pain    Home Living                      Prior Function            PT Goals (current goals can now be found in the care plan section) Acute Rehab PT Goals Patient Stated Goal: be able to walk "down the hall" again PT Goal Formulation: With patient Time For Goal Achievement: 01/24/16 Potential to Achieve Goals: Good Progress towards PT goals: Progressing toward goals    Frequency  Min 3X/week    PT Plan Current plan remains appropriate    Co-evaluation  End of Session Equipment Utilized During Treatment: Gait belt Activity Tolerance: Patient tolerated treatment well Patient left: in chair;with call bell/phone within reach     Time: 1122-1149 PT Time Calculation (min) (ACUTE ONLY): 27 min  Charges:  $Gait Training: 23-37 mins                    G CodesCatarina Hartshorn, [place] 01/14/2016, 12:12 PM

## 2016-01-15 DIAGNOSIS — N183 Chronic kidney disease, stage 3 (moderate): Secondary | ICD-10-CM | POA: Diagnosis not present

## 2016-01-15 DIAGNOSIS — E1122 Type 2 diabetes mellitus with diabetic chronic kidney disease: Secondary | ICD-10-CM | POA: Diagnosis not present

## 2016-01-15 DIAGNOSIS — R339 Retention of urine, unspecified: Secondary | ICD-10-CM | POA: Diagnosis not present

## 2016-01-15 DIAGNOSIS — E1151 Type 2 diabetes mellitus with diabetic peripheral angiopathy without gangrene: Secondary | ICD-10-CM | POA: Diagnosis not present

## 2016-01-15 DIAGNOSIS — I130 Hypertensive heart and chronic kidney disease with heart failure and stage 1 through stage 4 chronic kidney disease, or unspecified chronic kidney disease: Secondary | ICD-10-CM | POA: Diagnosis not present

## 2016-01-15 DIAGNOSIS — I255 Ischemic cardiomyopathy: Secondary | ICD-10-CM | POA: Diagnosis not present

## 2016-01-15 DIAGNOSIS — I252 Old myocardial infarction: Secondary | ICD-10-CM | POA: Diagnosis not present

## 2016-01-15 DIAGNOSIS — I5043 Acute on chronic combined systolic (congestive) and diastolic (congestive) heart failure: Secondary | ICD-10-CM | POA: Diagnosis not present

## 2016-01-15 DIAGNOSIS — I2510 Atherosclerotic heart disease of native coronary artery without angina pectoris: Secondary | ICD-10-CM | POA: Diagnosis not present

## 2016-01-17 ENCOUNTER — Encounter: Payer: Self-pay | Admitting: Internal Medicine

## 2016-01-17 ENCOUNTER — Other Ambulatory Visit: Payer: Self-pay | Admitting: Internal Medicine

## 2016-01-17 DIAGNOSIS — N183 Chronic kidney disease, stage 3 (moderate): Secondary | ICD-10-CM | POA: Diagnosis not present

## 2016-01-17 DIAGNOSIS — I255 Ischemic cardiomyopathy: Secondary | ICD-10-CM | POA: Diagnosis not present

## 2016-01-17 DIAGNOSIS — R339 Retention of urine, unspecified: Secondary | ICD-10-CM | POA: Diagnosis not present

## 2016-01-17 DIAGNOSIS — I5043 Acute on chronic combined systolic (congestive) and diastolic (congestive) heart failure: Secondary | ICD-10-CM | POA: Diagnosis not present

## 2016-01-17 DIAGNOSIS — I252 Old myocardial infarction: Secondary | ICD-10-CM | POA: Diagnosis not present

## 2016-01-17 DIAGNOSIS — I130 Hypertensive heart and chronic kidney disease with heart failure and stage 1 through stage 4 chronic kidney disease, or unspecified chronic kidney disease: Secondary | ICD-10-CM | POA: Diagnosis not present

## 2016-01-17 DIAGNOSIS — E1151 Type 2 diabetes mellitus with diabetic peripheral angiopathy without gangrene: Secondary | ICD-10-CM | POA: Diagnosis not present

## 2016-01-17 DIAGNOSIS — I2510 Atherosclerotic heart disease of native coronary artery without angina pectoris: Secondary | ICD-10-CM | POA: Diagnosis not present

## 2016-01-17 DIAGNOSIS — E1122 Type 2 diabetes mellitus with diabetic chronic kidney disease: Secondary | ICD-10-CM | POA: Diagnosis not present

## 2016-01-17 NOTE — Telephone Encounter (Signed)
Asked to refill pantoprazole.  Indication is unclear from problem list.  Possibly as a GI protectant since patient is taking ASA 81 mg daily.  Will refill a 90 day supply and ask Dr. Evette Doffing to review indication and document and refill as he sees appropriate.

## 2016-01-19 ENCOUNTER — Other Ambulatory Visit: Payer: Self-pay

## 2016-01-19 ENCOUNTER — Emergency Department (HOSPITAL_COMMUNITY)
Admission: EM | Admit: 2016-01-19 | Discharge: 2016-01-19 | Disposition: A | Discharge status: Home / Self Care | Payer: Commercial Managed Care - HMO | Attending: Emergency Medicine | Admitting: Emergency Medicine

## 2016-01-19 ENCOUNTER — Emergency Department (HOSPITAL_COMMUNITY): Payer: Commercial Managed Care - HMO

## 2016-01-19 ENCOUNTER — Encounter (HOSPITAL_COMMUNITY): Payer: Self-pay

## 2016-01-19 DIAGNOSIS — I2510 Atherosclerotic heart disease of native coronary artery without angina pectoris: Secondary | ICD-10-CM | POA: Insufficient documentation

## 2016-01-19 DIAGNOSIS — Z8673 Personal history of transient ischemic attack (TIA), and cerebral infarction without residual deficits: Secondary | ICD-10-CM | POA: Insufficient documentation

## 2016-01-19 DIAGNOSIS — E161 Other hypoglycemia: Secondary | ICD-10-CM | POA: Diagnosis not present

## 2016-01-19 DIAGNOSIS — Z7982 Long term (current) use of aspirin: Secondary | ICD-10-CM | POA: Insufficient documentation

## 2016-01-19 DIAGNOSIS — Z9861 Coronary angioplasty status: Secondary | ICD-10-CM | POA: Insufficient documentation

## 2016-01-19 DIAGNOSIS — E785 Hyperlipidemia, unspecified: Secondary | ICD-10-CM | POA: Diagnosis not present

## 2016-01-19 DIAGNOSIS — Z9889 Other specified postprocedural states: Secondary | ICD-10-CM | POA: Diagnosis not present

## 2016-01-19 DIAGNOSIS — H269 Unspecified cataract: Secondary | ICD-10-CM | POA: Insufficient documentation

## 2016-01-19 DIAGNOSIS — I129 Hypertensive chronic kidney disease with stage 1 through stage 4 chronic kidney disease, or unspecified chronic kidney disease: Secondary | ICD-10-CM | POA: Insufficient documentation

## 2016-01-19 DIAGNOSIS — I252 Old myocardial infarction: Secondary | ICD-10-CM | POA: Diagnosis not present

## 2016-01-19 DIAGNOSIS — T1490 Injury, unspecified: Secondary | ICD-10-CM | POA: Diagnosis not present

## 2016-01-19 DIAGNOSIS — I5022 Chronic systolic (congestive) heart failure: Secondary | ICD-10-CM | POA: Insufficient documentation

## 2016-01-19 DIAGNOSIS — Z86010 Personal history of colonic polyps: Secondary | ICD-10-CM | POA: Insufficient documentation

## 2016-01-19 DIAGNOSIS — R531 Weakness: Secondary | ICD-10-CM | POA: Diagnosis not present

## 2016-01-19 DIAGNOSIS — I509 Heart failure, unspecified: Secondary | ICD-10-CM | POA: Insufficient documentation

## 2016-01-19 DIAGNOSIS — Z794 Long term (current) use of insulin: Secondary | ICD-10-CM | POA: Insufficient documentation

## 2016-01-19 DIAGNOSIS — Z862 Personal history of diseases of the blood and blood-forming organs and certain disorders involving the immune mechanism: Secondary | ICD-10-CM | POA: Diagnosis not present

## 2016-01-19 DIAGNOSIS — E119 Type 2 diabetes mellitus without complications: Secondary | ICD-10-CM | POA: Diagnosis not present

## 2016-01-19 DIAGNOSIS — Z79899 Other long term (current) drug therapy: Secondary | ICD-10-CM | POA: Insufficient documentation

## 2016-01-19 DIAGNOSIS — Z87438 Personal history of other diseases of male genital organs: Secondary | ICD-10-CM | POA: Insufficient documentation

## 2016-01-19 DIAGNOSIS — R918 Other nonspecific abnormal finding of lung field: Secondary | ICD-10-CM | POA: Diagnosis not present

## 2016-01-19 DIAGNOSIS — N189 Chronic kidney disease, unspecified: Secondary | ICD-10-CM | POA: Insufficient documentation

## 2016-01-19 DIAGNOSIS — E162 Hypoglycemia, unspecified: Secondary | ICD-10-CM | POA: Diagnosis not present

## 2016-01-19 DIAGNOSIS — M1611 Unilateral primary osteoarthritis, right hip: Secondary | ICD-10-CM | POA: Insufficient documentation

## 2016-01-19 DIAGNOSIS — Z950 Presence of cardiac pacemaker: Secondary | ICD-10-CM | POA: Insufficient documentation

## 2016-01-19 LAB — CBC WITH DIFFERENTIAL/PLATELET
BASOS ABS: 0 10*3/uL (ref 0.0–0.1)
Basophils Relative: 0 %
EOS ABS: 0.2 10*3/uL (ref 0.0–0.7)
EOS PCT: 2 %
HCT: 35.3 % — ABNORMAL LOW (ref 39.0–52.0)
Hemoglobin: 11.4 g/dL — ABNORMAL LOW (ref 13.0–17.0)
LYMPHS PCT: 23 %
Lymphs Abs: 2.4 10*3/uL (ref 0.7–4.0)
MCH: 29.5 pg (ref 26.0–34.0)
MCHC: 32.3 g/dL (ref 30.0–36.0)
MCV: 91.2 fL (ref 78.0–100.0)
Monocytes Absolute: 0.9 10*3/uL (ref 0.1–1.0)
Monocytes Relative: 9 %
Neutro Abs: 7 10*3/uL (ref 1.7–7.7)
Neutrophils Relative %: 66 %
PLATELETS: 330 10*3/uL (ref 150–400)
RBC: 3.87 MIL/uL — AB (ref 4.22–5.81)
RDW: 15.5 % (ref 11.5–15.5)
WBC: 10.4 10*3/uL (ref 4.0–10.5)

## 2016-01-19 LAB — COMPREHENSIVE METABOLIC PANEL
ALT: 23 U/L (ref 17–63)
AST: 35 U/L (ref 15–41)
Albumin: 3.2 g/dL — ABNORMAL LOW (ref 3.5–5.0)
Alkaline Phosphatase: 45 U/L (ref 38–126)
Anion gap: 10 (ref 5–15)
BILIRUBIN TOTAL: 0.4 mg/dL (ref 0.3–1.2)
BUN: 30 mg/dL — AB (ref 6–20)
CO2: 27 mmol/L (ref 22–32)
Calcium: 9.5 mg/dL (ref 8.9–10.3)
Chloride: 104 mmol/L (ref 101–111)
Creatinine, Ser: 2.18 mg/dL — ABNORMAL HIGH (ref 0.61–1.24)
GFR, EST AFRICAN AMERICAN: 30 mL/min — AB (ref >60)
GFR, EST NON AFRICAN AMERICAN: 26 mL/min — AB (ref >60)
Glucose, Bld: 117 mg/dL — ABNORMAL HIGH (ref 65–99)
POTASSIUM: 4 mmol/L (ref 3.5–5.1)
Sodium: 141 mmol/L (ref 135–145)
TOTAL PROTEIN: 7.5 g/dL (ref 6.5–8.1)

## 2016-01-19 LAB — PROTIME-INR
INR: 1.27 (ref 0.00–1.49)
Prothrombin Time: 16.1 seconds — ABNORMAL HIGH (ref 11.6–15.2)

## 2016-01-19 LAB — APTT: aPTT: 34 seconds (ref 24–37)

## 2016-01-19 LAB — TROPONIN I: TROPONIN I: 0.05 ng/mL — AB (ref <0.031)

## 2016-01-19 LAB — ETHANOL

## 2016-01-19 MED ORDER — SODIUM CHLORIDE 0.9 % IV BOLUS (SEPSIS)
500.0000 mL | Freq: Once | INTRAVENOUS | Status: AC
  Administered 2016-01-19: 500 mL via INTRAVENOUS

## 2016-01-19 MED ORDER — SODIUM CHLORIDE 0.9 % IV SOLN: 100.0000 mL/h | INTRAVENOUS | Status: DC

## 2016-01-19 NOTE — ED Notes (Signed)
Pt. Walked in the hallway. Pt. sts he feels great. EDP made aware. Pt. Given food and drink at this time.

## 2016-01-19 NOTE — Discharge Instructions (Signed)
As discussed, your evaluation today has been largely reassuring.  But, it is important that you monitor your condition carefully, and do not hesitate to return to the ED if you develop new, or concerning changes in your condition. ? ?Otherwise, please follow-up with your physician for appropriate ongoing care. ? ?

## 2016-01-19 NOTE — ED Notes (Signed)
Pt presents with onset of weakness, inability to stand, slid down on floor.  CBG was 48 with D10 given with repeat: 229 (1135)

## 2016-01-19 NOTE — ED Provider Notes (Signed)
CSN: [id number]     Arrival date & time 01/19/16  1204 History   First MD Initiated Contact with Patient 01/19/16 1205     Chief Complaint  Patient presents with  . Weakness     (Consider location/radiation/quality/duration/timing/severity/associated sxs/prior Treatment) HPI  Patient presents with concern of weakness. Just prior to arrival, the patient was sitting on The couch, felt his body become weak, particularly in the lower extremities, and he slipped from the couch to the floor. Currently the patient states that he feels better, denies weakness or He also denies pain. He states that he has been generally well since recent admission. He states that he takes all medication as directed. Patient has a very notable history of recent cardiac arrest. Patient has a defibrillator in place. No recent medication changes per Per report, the patient's blood sugar on the scene was 48.   Past Medical History  Diagnosis Date  . Cataracts, bilateral   . Chronic systolic CHF (congestive heart failure) ([place])     a. ischemic CM EF 15-20%;  b. s/p AICD 05/24/04;  c. Echo 7/06: EF 30-40%, mild reduced RVSF  . CAD (coronary artery disease)     a. s/p AMI, s/p PTCA & stent of cx 12/04;  b. LHC (5/05): Proximal LAD 100% with bridging collaterals (CTO), proximal circumflex 20%, mid circumflex 95% ISR, proximal-mid RCA 75%, EF 20% >>PCI: 3.0 x 28 mm Cypher DES to the mid CFX  . Diverticulosis of colon   . Hyperlipidemia   . PVD (peripheral vascular disease) ([place])     s/p L carotid PTCA/stent 2004  . Transient ischemic attack   . Erectile dysfunction   . Hyperkalemia 08/2008    K=5.7   . BBB (bundle branch block)     right  . HTN (hypertension)   . Elevated PSA   . Adenomatous colon polyp 02/14/2012  . ICD (implantable cardiac defibrillator) in place 12-25-2012    MDT CRTD upgrade by Dr Lovena Le  . Pacemaker   . Type II diabetes mellitus ([place])   . Myocardial infarction ([place]) 1990  . Arthritis      "right hip" (12/25/2012)  . Carotid stenosis     a. s/p L carotid stent 2004;  b. Carotid US (09/2014): Bilateral ICA 1-39%, left ECA >59%, normal subclavian bilaterally, occluded left vertebral >> FU 2 years  . Pericardial effusion     Echocardiogram (09/2014): EF 25% with distal anterior, distal inferior, distal lateral and apical akinesis, grade 1 diastolic dysfunction, very mild aortic stenosis (mean 7 mmHg) - this may be depressed due to low EF (2-D images suggest mild to moderate aortic stenosis), large pericardial effusion, no RA collapse  . Presence of permanent cardiac pacemaker   . AICD (automatic cardioverter/defibrillator) present   . Myocardial infarction ([place])   . Coronary artery disease   . Hypertension   . Dysrhythmia   . Peripheral vascular disease ([place])   . CHF (congestive heart failure) ([place])   . Diabetes mellitus without complication ([place])   . Chronic kidney disease   . Anemia    Past Surgical History  Procedure Laterality Date  . Cardiac defibrillator placement  05/24/2004    Implantation of a MDT single-chamber defibrillator  . Carotid stent  09/11/2003    Percutaneous transluminal angioplasty and stent placement of the left internal carotid artery.  . Biv icd upgrade  12/25/2012    MDT CRTD upgrade by Dr Lovena Le for ischemic cardiomyopathy and worsening conduction system disease  .  Cataract extraction w/ intraocular lens  implant, bilateral Bilateral ~ 2010  . Cardiac catheterization  06/2003,  01/2004  . Coronary angioplasty with stent placement  1990    "2" (12/25/2012)  . Bi-ventricular implantable cardioverter defibrillator upgrade N/A 12/25/2012    Procedure: BI-VENTRICULAR IMPLANTABLE CARDIOVERTER DEFIBRILLATOR UPGRADE;  Surgeon: Evans Lance, MD;  Location: [facility] CATH LAB;  Service: Cardiovascular;  Laterality: N/A;  . Lead revision N/A 12/25/2012    Procedure: LEAD REVISION;  Surgeon: Evans Lance, MD;  Location: [facility] CATH LAB;  Service: Cardiovascular;  Laterality:  N/A;  . Insert / replace / remove pacemaker    . Cardiac catheterization N/A 01/13/2016    Procedure: Left Heart Cath and Coronary Angiography;  Surgeon: Jettie Booze, MD;  Location: [place] CV LAB;  Service: Cardiovascular;  Laterality: N/A;   Family History  Problem Relation Age of Onset  . Diabetes Mother   . Diabetes Brother   . Heart attack Neg Hx   . Stroke Neg Hx    Social History  Substance Use Topics  . Smoking status: Former Smoker    Types: Cigarettes    Quit date: 12/27/1967  . Smokeless tobacco: Never Used  . Alcohol Use: No     Comment: 12/25/2012 "quit all alcohol 60 yr ago"    Review of Systems  Constitutional:       Per HPI, otherwise negative  HENT:       Per HPI, otherwise negative  Respiratory:       Per HPI, otherwise negative  Cardiovascular:       Per HPI, otherwise negative  Gastrointestinal: Negative for vomiting.  Endocrine:       Negative aside from HPI  Genitourinary:       Neg aside from HPI   Musculoskeletal:       Per HPI, otherwise negative  Skin: Negative.   Neurological: Positive for weakness. Negative for syncope.      Allergies  Review of patient's allergies indicates no known allergies.  Home Medications   Prior to Admission medications   Medication Sig Start Date End Date Taking? Authorizing Provider  ACCU-CHEK AVIVA PLUS test strip USE TO CHECK BLOOD SUGARS THREE TIMES DAILY TO FOUR TIMES DAILY 11/24/15  Yes Axel Filler, MD  albuterol (PROVENTIL HFA;VENTOLIN HFA) 108 (90 Base) MCG/ACT inhaler Inhale 1-2 puffs into the lungs every 4 (four) hours as needed for wheezing or shortness of breath.   Yes Historical Provider, MD  albuterol (VENTOLIN HFA) 108 (90 BASE) MCG/ACT inhaler Inhale 1-2 puffs into the lungs every 4 (four) hours as needed for wheezing or shortness of breath. 01/05/15  Yes [name], MD  aspirin EC 81 MG tablet Take 81 mg by mouth daily.   Yes Historical Provider, MD  atorvastatin (LIPITOR)  40 MG tablet Take 1 tablet (40 mg total) by mouth daily at 6 PM. 01/14/16  Yes Shela Leff, MD  B-D ULTRAFINE III SHORT PEN 31G X 8 MM MISC USE AS DIRECTED ONCE DAILY FOR INSULIN INJECTION 09/10/15  Yes Axel Filler, MD  Blood Glucose Monitoring Suppl (ACCU-CHEK AVIVA PLUS) w/Device KIT Please use 3 to 4 times daily to check blood glucose. diag code E11.9. Insulin dependent 11/23/15  Yes Axel Filler, MD  carvedilol (COREG) 25 MG tablet TAKE 1 TABLET BY MOUTH TWICE DAILY WITH A MEAL 12/10/15  Yes Axel Filler, MD  furosemide (LASIX) 40 MG tablet Take 1 tablet (40 mg total) by mouth 2 (two) times  daily. 11/22/15  Yes Juluis Mire, MD  Insulin Glargine (LANTUS SOLOSTAR) 100 UNIT/ML Solostar Pen Inject 30 Units into the skin at bedtime. 05/07/15  Yes Maryellen Pile, MD  Lancets (ACCU-CHEK MULTICLIX) lancets USE THREE TIMES DAILY TO FOUR TIMES DAILY TO CHECK BLOOD SUGAR 11/24/15  Yes Axel Filler, MD  Lancets Misc. (ACCU-CHEK FASTCLIX LANCET) KIT Please use to check blood sugar 3 to 4 times daily. diag code E11.9. Insulin dependent 11/23/15  Yes Axel Filler, MD  lisinopril (PRINIVIL,ZESTRIL) 5 MG tablet Take 1 tablet (5 mg total) by mouth daily. 05/07/15  Yes Maryellen Pile, MD  pantoprazole (PROTONIX) 40 MG tablet Take 1 tablet (40 mg total) by mouth daily. 01/17/16  Yes Oval Linsey, MD  simvastatin (ZOCOR) 40 MG tablet TAKE 1 TABLET BY MOUTH DAILY 09/29/15  Yes Axel Filler, MD  tamsulosin (FLOMAX) 0.4 MG CAPS capsule Take 0.8 mg by mouth at bedtime.   Yes Historical Provider, MD  aspirin EC 81 MG tablet Take 81 mg by mouth daily.    Historical Provider, MD  furosemide (LASIX) 40 MG tablet Take 40 mg by mouth 2 (two) times daily.    Historical Provider, MD  insulin glargine (LANTUS) 100 UNIT/ML injection Inject 30 Units into the skin at bedtime.    Historical Provider, MD  lisinopril (PRINIVIL,ZESTRIL) 5 MG tablet Take 5 mg by mouth daily.     Historical Provider, MD   BP 96/56 mmHg  Pulse 62  Temp(Src) 97.8 F (36.6 C) (Oral)  Resp 20  Ht '5\' 10"'$  (1.778 m)  Wt 159 lb (72.122 kg)  BMI 22.81 kg/m2  SpO2 100% Physical Exam  Constitutional: He is oriented to person, place, and time. He appears well-developed. No distress.  HENT:  Head: Normocephalic and atraumatic.  Eyes: Conjunctivae and EOM are normal.  Cardiovascular: Normal rate and regular rhythm.   Pulmonary/Chest: Effort normal. No stridor. No respiratory distress.    Abdominal: He exhibits no distension.  Musculoskeletal: He exhibits no edema.  Neurological: He is alert and oriented to person, place, and time.  Skin: Skin is warm and dry.  Psychiatric: He has a normal mood and affect.  Nursing note and vitals reviewed.   ED Course  Procedures (including critical care time) Labs Review Labs Reviewed  COMPREHENSIVE METABOLIC PANEL - Abnormal; Notable for the following:    Glucose, Bld 117 (*)    BUN 30 (*)    Creatinine, Ser 2.18 (*)    Albumin 3.2 (*)    GFR calc non Af Amer 26 (*)    GFR calc Af Amer 30 (*)    All other components within normal limits  CBC WITH DIFFERENTIAL/PLATELET - Abnormal; Notable for the following:    RBC 3.87 (*)    Hemoglobin 11.4 (*)    HCT 35.3 (*)    All other components within normal limits  TROPONIN I - Abnormal; Notable for the following:    Troponin I 0.05 (*)    All other components within normal limits  PROTIME-INR - Abnormal; Notable for the following:    Prothrombin Time 16.1 (*)    All other components within normal limits  APTT  ETHANOL  URINALYSIS, ROUTINE W REFLEX MICROSCOPIC (NOT AT [facility])    Imaging Review Dg Chest 2 View  01/19/2016  CLINICAL DATA:  82 year old male with a history of weakness EXAM: CHEST - 2 VIEW COMPARISON:  11/12/2015, abdominal CT 11/17/2015 FINDINGS: Cardiomediastinal silhouette unchanged, with cardiomegaly. Unchanged position of left chest wall cardiac  pacing device/AICD. Changes  of prior PTCA. Low lung volumes with opacification of the retrocardiac region. Blunting of the costophrenic sulcus on the lateral view. Coarsened interstitial markings bilaterally.  No pneumothorax. Hepatic flexure interposed between the diaphragm and liver, similar to comparison CT. IMPRESSION: Left basilar opacity, potentially a combination of pleural effusion, atelectasis, consolidation. Persisting cardiomegaly. Persisting pericardial effusion from the prior CT cannot be excluded. Atherosclerosis. Unchanged cardiac pacing device/AICD. Signed, [name]. [name], DO [facility] Radiology Electronically Signed   By: [name] D.O.   On: 01/19/2016 12:53   I have personally reviewed and evaluated these images and lab results as part of my medical decision-making.   EKG Interpretation   Date/Time:  Wednesday January 19 2016 12:11:58 EDT Ventricular Rate:  65 PR Interval:  166 QRS Duration: 163 QT Interval:  477 QTC Calculation: 496 R Axis:   -110 Text Interpretation:  Sinus rhythm Left atrial enlargement Right bundle  branch block Lateral infarct, recent Artifact Abnormal ekg Confirmed by  Carmin Muskrat  MD [id number]) on 01/19/2016 12:18:27 PM      3:39 PM Patient ambulatory, in no distress, no dysfunction. All labs reviewed with the patient and his family member. Patient does have slightly abnormal troponin, but this is declining since his recent admission, seemingly appropriately. Chest x-ray slightly abnormal, though also not remarkable. Vital signs remained stable.  MDM  Patient presents after recent hospitalization, now with concern of weakness. Here, the patient is awake and alert, neurologically intact, in no distress, smiling, interactive appropriately. Patient is ambulatory, and after having food, fluids, has no ongoing weakness. Some suspicion for medication effects, no indication for further admission, emergent intervention. With  his improvement he was discharged in stable condition to follow-up with primary care.   Carmin Muskrat, MD 01/19/16 1540

## 2016-01-20 DIAGNOSIS — N183 Chronic kidney disease, stage 3 (moderate): Secondary | ICD-10-CM | POA: Diagnosis not present

## 2016-01-20 DIAGNOSIS — I2510 Atherosclerotic heart disease of native coronary artery without angina pectoris: Secondary | ICD-10-CM | POA: Diagnosis not present

## 2016-01-20 DIAGNOSIS — Z8674 Personal history of sudden cardiac arrest: Secondary | ICD-10-CM | POA: Diagnosis not present

## 2016-01-20 DIAGNOSIS — I130 Hypertensive heart and chronic kidney disease with heart failure and stage 1 through stage 4 chronic kidney disease, or unspecified chronic kidney disease: Secondary | ICD-10-CM | POA: Diagnosis not present

## 2016-01-20 DIAGNOSIS — E1151 Type 2 diabetes mellitus with diabetic peripheral angiopathy without gangrene: Secondary | ICD-10-CM | POA: Diagnosis not present

## 2016-01-20 DIAGNOSIS — I5043 Acute on chronic combined systolic (congestive) and diastolic (congestive) heart failure: Secondary | ICD-10-CM | POA: Diagnosis not present

## 2016-01-20 DIAGNOSIS — R339 Retention of urine, unspecified: Secondary | ICD-10-CM | POA: Diagnosis not present

## 2016-01-20 DIAGNOSIS — I255 Ischemic cardiomyopathy: Secondary | ICD-10-CM | POA: Diagnosis not present

## 2016-01-20 DIAGNOSIS — E1122 Type 2 diabetes mellitus with diabetic chronic kidney disease: Secondary | ICD-10-CM | POA: Diagnosis not present

## 2016-01-21 ENCOUNTER — Encounter: Payer: Self-pay | Admitting: Physician Assistant

## 2016-01-21 ENCOUNTER — Ambulatory Visit (INDEPENDENT_AMBULATORY_CARE_PROVIDER_SITE_OTHER): Payer: Commercial Managed Care - HMO | Admitting: Physician Assistant

## 2016-01-21 VITALS — BP 82/50 | HR 64 | Ht 70.0 in | Wt 161.0 lb

## 2016-01-21 DIAGNOSIS — I5022 Chronic systolic (congestive) heart failure: Secondary | ICD-10-CM

## 2016-01-21 DIAGNOSIS — I10 Essential (primary) hypertension: Secondary | ICD-10-CM

## 2016-01-21 DIAGNOSIS — I959 Hypotension, unspecified: Secondary | ICD-10-CM | POA: Diagnosis not present

## 2016-01-21 MED ORDER — CARVEDILOL 12.5 MG PO TABS: 12.5000 mg | ORAL_TABLET | Freq: Two times a day (BID) | ORAL | Status: DC

## 2016-01-21 NOTE — Progress Notes (Signed)
Cardiology Office Note   Date:  01/21/2016   ID:  [name], DOB [birth year], MRN [id number]  PCP:  Axel Filler, MD  Cardiologist:  Dr Brayton Mars, PA-C   Chief Complaint  Patient presents with  . Follow-up    No symptoms today    History of Present Illness: [name] is a 82 y.o. male with a history of CAD (last PCI noted 2005), ICM, CHF, DM, HTN, HLD, PVD with left carotid stent 2004, and CRI (baseline Cr 2.0), admitted 04/14 after VF arrest. K+ 6.9, Cr 5.89. Cath when Cr back to baseline w/ LAD 100% (chronic), o/w non-obs dz. EF 30-35% by echo.  [name] presents for Post hospital follow-up  Since discharge from the hospital, [name] has done well. He is tracking his weight and there has been no significant change. He is also tracking his blood sugars every morning. He has had no chest pain. He has a little bit of lower extremity edema, but he says he is not waking up with this. He denies orthopnea or PND. He is increasing his activity and has not had any chest pain.  He was in the ER on 04/26 with weakness, saying he slipped from the couch to the floor. His systolic blood pressure was 96 at that time.  He is compliant with his medications and is tolerating them well. His blood pressure is low in the office today with a systolic of 82, but he is asymptomatic with this. He has no other issues or concerns.   Past Medical History  Diagnosis Date  . Cataracts, bilateral   . Chronic systolic CHF (congestive heart failure) ([place])     a. ischemic CM EF 15-20%;  b. s/p AICD 05/24/04;  c. Echo 7/06: EF 30-40%, mild reduced RVSF, d. Echo 12/2015 EF 35-40%  . CAD (coronary artery disease)     a. s/p AMI, s/p PTCA & stent of cx 12/04;  b. LHC (5/05): Proximal LAD 100% with bridging collaterals (CTO), proximal circumflex 20%, mid circumflex 95% ISR, proximal-mid RCA 75%, EF 20% >>PCI: 3.0 x 28 mm Cypher DES to the mid CFX  . Diverticulosis of  colon   . Hyperlipidemia   . PVD (peripheral vascular disease) ([place])     s/p L carotid PTCA/stent 2004  . Transient ischemic attack   . Erectile dysfunction   . Hyperkalemia 08/2008    K=5.7   . BBB (bundle branch block)     right  . HTN (hypertension)   . Elevated PSA   . Adenomatous colon polyp 02/14/2012  . ICD (implantable cardiac defibrillator) in place 12-25-2012    MDT CRTD upgrade by Dr Lovena Le  . Pacemaker   . Type II diabetes mellitus ([place])   . Myocardial infarction ([place]) 1990  . Arthritis     "right hip" (12/25/2012)  . Carotid stenosis     a. s/p L carotid stent 2004;  b. Carotid US (09/2014): Bilateral ICA 1-39%, left ECA >59%, normal subclavian bilaterally, occluded left vertebral >> FU 2 years  . Pericardial effusion     Echocardiogram (09/2014): EF 25% with distal anterior, distal inferior, distal lateral and apical akinesis, grade 1 diastolic dysfunction, very mild aortic stenosis (mean 7 mmHg) - this may be depressed due to low EF (2-D images suggest mild to moderate aortic stenosis), large pericardial effusion, no RA collapse  . Presence of permanent cardiac pacemaker   . AICD (automatic cardioverter/defibrillator) present   .  Myocardial infarction ([place])   . Coronary artery disease   . Hypertension   . Dysrhythmia   . Peripheral vascular disease ([place])   . CHF (congestive heart failure) ([place])   . Diabetes mellitus without complication ([place])   . Chronic kidney disease   . Anemia     Past Surgical History  Procedure Laterality Date  . Cardiac defibrillator placement  05/24/2004    Implantation of a MDT single-chamber defibrillator  . Carotid stent  09/11/2003    Percutaneous transluminal angioplasty and stent placement of the left internal carotid artery.  . Biv icd upgrade  12/25/2012    MDT CRTD upgrade by Dr Lovena Le for ischemic cardiomyopathy and worsening conduction system disease  . Cataract extraction w/ intraocular lens  implant, bilateral Bilateral ~ 2010  .  Cardiac catheterization  06/2003,  01/2004  . Coronary angioplasty with stent placement  1990    "2" (12/25/2012)  . Bi-ventricular implantable cardioverter defibrillator upgrade N/A 12/25/2012    Procedure: BI-VENTRICULAR IMPLANTABLE CARDIOVERTER DEFIBRILLATOR UPGRADE;  Surgeon: Evans Lance, MD;  Location: [facility] CATH LAB;  Service: Cardiovascular;  Laterality: N/A;  . Lead revision N/A 12/25/2012    Procedure: LEAD REVISION;  Surgeon: Evans Lance, MD;  Location: [facility] CATH LAB;  Service: Cardiovascular;  Laterality: N/A;  . Insert / replace / remove pacemaker    . Cardiac catheterization N/A 01/13/2016    Procedure: Left Heart Cath and Coronary Angiography;  Surgeon: Jettie Booze, MD;  Location: [place] CV LAB;  Service: Cardiovascular;  Laterality: N/A;    Current Outpatient Prescriptions  Medication Sig Dispense Refill  . ACCU-CHEK AVIVA PLUS test strip USE TO CHECK BLOOD SUGARS THREE TIMES DAILY TO FOUR TIMES DAILY 300 each 6  . acetaminophen (TYLENOL) 500 MG tablet Take 500 mg by mouth every 6 (six) hours as needed.    Marland Kitchen aspirin EC 81 MG tablet Take 81 mg by mouth daily.    Marland Kitchen atorvastatin (LIPITOR) 40 MG tablet Take 1 tablet (40 mg total) by mouth daily at 6 PM. 90 tablet 0  . B-D ULTRAFINE III SHORT PEN 31G X 8 MM MISC USE AS DIRECTED ONCE DAILY FOR INSULIN INJECTION 100 each 0  . Blood Glucose Monitoring Suppl (ACCU-CHEK AVIVA PLUS) w/Device KIT Please use 3 to 4 times daily to check blood glucose. diag code E11.9. Insulin dependent 1 kit 0  . carvedilol (COREG) 25 MG tablet TAKE 1 TABLET BY MOUTH TWICE DAILY WITH A MEAL 180 tablet 0  . furosemide (LASIX) 40 MG tablet Take 1 tablet (40 mg total) by mouth 2 (two) times daily. 180 tablet 3  . Insulin Glargine (LANTUS SOLOSTAR) 100 UNIT/ML Solostar Pen Inject 30 Units into the skin at bedtime. 15 mL 11  . Lancets (ACCU-CHEK MULTICLIX) lancets USE THREE TIMES DAILY TO FOUR TIMES DAILY TO CHECK BLOOD SUGAR 306 each 6  . Lancets Misc.  (ACCU-CHEK FASTCLIX LANCET) KIT Please use to check blood sugar 3 to 4 times daily. diag code E11.9. Insulin dependent 1 kit 0  . lisinopril (PRINIVIL,ZESTRIL) 5 MG tablet Take 1 tablet (5 mg total) by mouth daily. 30 tablet 5  . Multiple Vitamins-Minerals (CENTRUM SILVER ADULT 50+) TABS Take by mouth daily.    . pantoprazole (PROTONIX) 40 MG tablet Take 1 tablet (40 mg total) by mouth daily. 90 tablet 0  . tamsulosin (FLOMAX) 0.4 MG CAPS capsule Take 0.8 mg by mouth at bedtime.     No current facility-administered medications for this visit.  Allergies:   Review of patient's allergies indicates no known allergies.    Social History:  The patient  reports that he quit smoking about 48 years ago. His smoking use included Cigarettes. He has never used smokeless tobacco. He reports that he does not drink alcohol or use illicit drugs.   Family History:  The patient's family history includes Diabetes in his brother and mother. There is no history of Heart attack or Stroke.    ROS:  Please see the history of present illness. All other systems are reviewed and negative.    PHYSICAL EXAM: VS:  Ht '5\' 10"'$  (1.778 m)  Wt 161 lb (73.029 kg)  BMI 23.10 kg/m2 , BMI Body mass index is 23.1 kg/(m^2). GEN: Well nourished, well developed, male in no acute distress HEENT: normal for age, poor dentition Neck: no JVD, no carotid bruit, no masses Cardiac: RRR; no murmur, no rubs, or gallops Respiratory:  clear to auscultation bilaterally, normal work of breathing GI: soft, nontender, nondistended, + BS MS: no deformity or atrophy; trace pedal edema; distal pulses are 2+ in all 4 extremities Skin: warm and dry, no rash Neuro:  Strength and sensation are intact Psych: euthymic mood, full affect   EKG:  EKG is ordered today. The ekg ordered today demonstrates sinus rhythm with ventricular pacing   Recent Labs: 11/11/2015: B Natriuretic Peptide 383.0* 01/13/2016: Magnesium 1.8 01/19/2016: ALT 23; BUN  30*; Creatinine, Ser 2.18*; Hemoglobin 11.4*; Platelets 330; Potassium 4.0; Sodium 141    Lipid Panel    Component Value Date/Time   CHOL 133 11/11/2015 0354   TRIG 85 11/11/2015 0354   HDL 55 11/11/2015 0354   CHOLHDL 2.4 11/11/2015 0354   VLDL 17 11/11/2015 0354   LDLCALC 61 11/11/2015 0354     Wt Readings from Last 3 Encounters:  01/21/16 161 lb (73.029 kg)  01/19/16 159 lb (72.122 kg)  01/14/16 155 lb (70.308 kg)     Other studies Reviewed: Additional studies/ records that were reviewed today include: Hospital records and testing.  ASSESSMENT AND PLAN:  1.  Chronic systolic CHF: His weight is up a couple pounds from when he was in the ED 2 days ago for weakness. His volume status appears good in his BUN and creatinine at that time were at his baseline. No change to his Lasix dose, continue ACE inhibitor and beta blocker  2. CAD: He is having no ischemic symptoms. He is on baby aspirin, statin, beta blocker and ACE inhibitor.  3. Hypotension: He is on lisinopril 5 mg, Lasix 40 mg twice a day and Coreg 25 mg twice a day. His volume status appears stable so I will not change the Lasix dose. The lisinopril is not a high dose. We will decrease the Coreg to 12.5 mg twice a day. Follow him for symptoms. This is possibly the reason for the weakness for which she was seen in the emergency room 2 days ago.   Current medicines are reviewed at length with the patient today.  The patient does not have concerns regarding medicines.  The following changes have been made:  Decrease Coreg  Labs/ tests ordered today include: None    Disposition:   FU with Dr. Lovena Le in 3 months or as scheduled   Signed, Lenoard Aden  01/21/2016 8:56 AM    [place] Phone: [phone]; Fax: [phone]  This note was written with the assistance of speech recognition software. Please excuse any transcriptional errors.

## 2016-01-21 NOTE — Patient Instructions (Addendum)
Medication Instructions:  Your physician has recommended you make the following change in your medication: 1- DECREASE carvedilol to 12.5 mg by mouth two times per day.  Labwork: None   Testing/Procedures: none  Follow-Up: Your physician recommends that you schedule a follow-up appointment in: 3 months with Dr Lovena Le.   Any Other Special Instructions Will Be Listed Below (If Applicable).     If you need a refill on your cardiac medications before your next appointment, please call your pharmacy.

## 2016-01-24 DIAGNOSIS — Z8674 Personal history of sudden cardiac arrest: Secondary | ICD-10-CM | POA: Diagnosis not present

## 2016-01-24 DIAGNOSIS — I130 Hypertensive heart and chronic kidney disease with heart failure and stage 1 through stage 4 chronic kidney disease, or unspecified chronic kidney disease: Secondary | ICD-10-CM | POA: Diagnosis not present

## 2016-01-24 DIAGNOSIS — R339 Retention of urine, unspecified: Secondary | ICD-10-CM | POA: Diagnosis not present

## 2016-01-24 DIAGNOSIS — E1151 Type 2 diabetes mellitus with diabetic peripheral angiopathy without gangrene: Secondary | ICD-10-CM | POA: Diagnosis not present

## 2016-01-24 DIAGNOSIS — N183 Chronic kidney disease, stage 3 (moderate): Secondary | ICD-10-CM | POA: Diagnosis not present

## 2016-01-24 DIAGNOSIS — I2510 Atherosclerotic heart disease of native coronary artery without angina pectoris: Secondary | ICD-10-CM | POA: Diagnosis not present

## 2016-01-24 DIAGNOSIS — I255 Ischemic cardiomyopathy: Secondary | ICD-10-CM | POA: Diagnosis not present

## 2016-01-24 DIAGNOSIS — I5043 Acute on chronic combined systolic (congestive) and diastolic (congestive) heart failure: Secondary | ICD-10-CM | POA: Diagnosis not present

## 2016-01-24 DIAGNOSIS — E1122 Type 2 diabetes mellitus with diabetic chronic kidney disease: Secondary | ICD-10-CM | POA: Diagnosis not present

## 2016-01-25 ENCOUNTER — Telehealth: Payer: Self-pay

## 2016-01-25 ENCOUNTER — Telehealth: Payer: Self-pay | Admitting: Student in an Organized Health Care Education/Training Program

## 2016-01-25 DIAGNOSIS — N183 Chronic kidney disease, stage 3 (moderate): Secondary | ICD-10-CM | POA: Diagnosis not present

## 2016-01-25 DIAGNOSIS — I255 Ischemic cardiomyopathy: Secondary | ICD-10-CM | POA: Diagnosis not present

## 2016-01-25 DIAGNOSIS — E1122 Type 2 diabetes mellitus with diabetic chronic kidney disease: Secondary | ICD-10-CM | POA: Diagnosis not present

## 2016-01-25 DIAGNOSIS — E1151 Type 2 diabetes mellitus with diabetic peripheral angiopathy without gangrene: Secondary | ICD-10-CM | POA: Diagnosis not present

## 2016-01-25 DIAGNOSIS — Z8674 Personal history of sudden cardiac arrest: Secondary | ICD-10-CM | POA: Diagnosis not present

## 2016-01-25 DIAGNOSIS — I2510 Atherosclerotic heart disease of native coronary artery without angina pectoris: Secondary | ICD-10-CM | POA: Diagnosis not present

## 2016-01-25 DIAGNOSIS — I130 Hypertensive heart and chronic kidney disease with heart failure and stage 1 through stage 4 chronic kidney disease, or unspecified chronic kidney disease: Secondary | ICD-10-CM | POA: Diagnosis not present

## 2016-01-25 DIAGNOSIS — R339 Retention of urine, unspecified: Secondary | ICD-10-CM | POA: Diagnosis not present

## 2016-01-25 DIAGNOSIS — I5043 Acute on chronic combined systolic (congestive) and diastolic (congestive) heart failure: Secondary | ICD-10-CM | POA: Diagnosis not present

## 2016-01-25 NOTE — Telephone Encounter (Signed)
Thank you Leigh.. I agree with your recommendations

## 2016-01-25 NOTE — Telephone Encounter (Signed)
Please call back Crystal from [facility].

## 2016-01-25 NOTE — Telephone Encounter (Signed)
APPT. REMINDER CALL, LMTCB °

## 2016-01-25 NOTE — Telephone Encounter (Signed)
Spoke with Crystal, RN with [facility]- she wanted to let us know that she has been unsuccessful in re-engaging patient in [facility] CM services since his admissions in April.  She wanted to be sure patient didn't fall through the cracks and was being followed appropriately so to best manage his chronic conditions and avoid additional hospitalizations.  Advised pt to be seen on 01/27/16 and had kept his cardiology follow up- MD to assess and be in touch with any additional concerns.  I will try to touch base with patient at [facility] to confirm his telephone number and interest in CM services with [facility]  FYI

## 2016-01-26 DIAGNOSIS — E1122 Type 2 diabetes mellitus with diabetic chronic kidney disease: Secondary | ICD-10-CM | POA: Diagnosis not present

## 2016-01-26 DIAGNOSIS — I130 Hypertensive heart and chronic kidney disease with heart failure and stage 1 through stage 4 chronic kidney disease, or unspecified chronic kidney disease: Secondary | ICD-10-CM | POA: Diagnosis not present

## 2016-01-26 DIAGNOSIS — R339 Retention of urine, unspecified: Secondary | ICD-10-CM | POA: Diagnosis not present

## 2016-01-26 DIAGNOSIS — E1151 Type 2 diabetes mellitus with diabetic peripheral angiopathy without gangrene: Secondary | ICD-10-CM | POA: Diagnosis not present

## 2016-01-26 DIAGNOSIS — Z8674 Personal history of sudden cardiac arrest: Secondary | ICD-10-CM | POA: Diagnosis not present

## 2016-01-26 DIAGNOSIS — I255 Ischemic cardiomyopathy: Secondary | ICD-10-CM | POA: Diagnosis not present

## 2016-01-26 DIAGNOSIS — I2510 Atherosclerotic heart disease of native coronary artery without angina pectoris: Secondary | ICD-10-CM | POA: Diagnosis not present

## 2016-01-26 DIAGNOSIS — I5043 Acute on chronic combined systolic (congestive) and diastolic (congestive) heart failure: Secondary | ICD-10-CM | POA: Diagnosis not present

## 2016-01-26 DIAGNOSIS — N183 Chronic kidney disease, stage 3 (moderate): Secondary | ICD-10-CM | POA: Diagnosis not present

## 2016-01-26 NOTE — Telephone Encounter (Signed)
VO to continue HH PT 2/week x 3 weeks  OK? PT Kendra with [facility] called requesting to continue strengthening and safety with patient.  She reports he is always reachable and compliant with their services.

## 2016-01-26 NOTE — Telephone Encounter (Signed)
VO given.

## 2016-01-26 NOTE — Telephone Encounter (Signed)
I agree with the order... Thank you

## 2016-01-27 ENCOUNTER — Observation Stay (HOSPITAL_COMMUNITY)
Admission: EM | Admit: 2016-01-27 | Discharge: 2016-01-28 | Disposition: A | Discharge status: Home / Self Care | Payer: Commercial Managed Care - HMO | Attending: Internal Medicine | Admitting: Internal Medicine

## 2016-01-27 ENCOUNTER — Ambulatory Visit: Payer: Commercial Managed Care - HMO | Admitting: Internal Medicine

## 2016-01-27 ENCOUNTER — Emergency Department (HOSPITAL_COMMUNITY): Payer: Commercial Managed Care - HMO

## 2016-01-27 ENCOUNTER — Encounter (HOSPITAL_COMMUNITY): Payer: Self-pay | Admitting: Emergency Medicine

## 2016-01-27 DIAGNOSIS — R0683 Snoring: Secondary | ICD-10-CM | POA: Diagnosis not present

## 2016-01-27 DIAGNOSIS — I313 Pericardial effusion (noninflammatory): Secondary | ICD-10-CM | POA: Diagnosis not present

## 2016-01-27 DIAGNOSIS — R918 Other nonspecific abnormal finding of lung field: Secondary | ICD-10-CM | POA: Insufficient documentation

## 2016-01-27 DIAGNOSIS — E785 Hyperlipidemia, unspecified: Secondary | ICD-10-CM | POA: Insufficient documentation

## 2016-01-27 DIAGNOSIS — Z7982 Long term (current) use of aspirin: Secondary | ICD-10-CM | POA: Diagnosis not present

## 2016-01-27 DIAGNOSIS — E11649 Type 2 diabetes mellitus with hypoglycemia without coma: Secondary | ICD-10-CM | POA: Diagnosis not present

## 2016-01-27 DIAGNOSIS — R531 Weakness: Secondary | ICD-10-CM | POA: Diagnosis not present

## 2016-01-27 DIAGNOSIS — I5022 Chronic systolic (congestive) heart failure: Secondary | ICD-10-CM | POA: Diagnosis not present

## 2016-01-27 DIAGNOSIS — Z833 Family history of diabetes mellitus: Secondary | ICD-10-CM | POA: Insufficient documentation

## 2016-01-27 DIAGNOSIS — I6523 Occlusion and stenosis of bilateral carotid arteries: Secondary | ICD-10-CM | POA: Diagnosis not present

## 2016-01-27 DIAGNOSIS — I739 Peripheral vascular disease, unspecified: Secondary | ICD-10-CM | POA: Insufficient documentation

## 2016-01-27 DIAGNOSIS — Z950 Presence of cardiac pacemaker: Secondary | ICD-10-CM | POA: Diagnosis not present

## 2016-01-27 DIAGNOSIS — Z955 Presence of coronary angioplasty implant and graft: Secondary | ICD-10-CM | POA: Diagnosis not present

## 2016-01-27 DIAGNOSIS — Z95810 Presence of automatic (implantable) cardiac defibrillator: Secondary | ICD-10-CM | POA: Diagnosis not present

## 2016-01-27 DIAGNOSIS — Z79899 Other long term (current) drug therapy: Secondary | ICD-10-CM | POA: Diagnosis not present

## 2016-01-27 DIAGNOSIS — I454 Nonspecific intraventricular block: Secondary | ICD-10-CM | POA: Insufficient documentation

## 2016-01-27 DIAGNOSIS — K5790 Diverticulosis of intestine, part unspecified, without perforation or abscess without bleeding: Secondary | ICD-10-CM | POA: Insufficient documentation

## 2016-01-27 DIAGNOSIS — Z794 Long term (current) use of insulin: Secondary | ICD-10-CM | POA: Diagnosis not present

## 2016-01-27 DIAGNOSIS — Z9842 Cataract extraction status, left eye: Secondary | ICD-10-CM | POA: Insufficient documentation

## 2016-01-27 DIAGNOSIS — R262 Difficulty in walking, not elsewhere classified: Secondary | ICD-10-CM | POA: Diagnosis not present

## 2016-01-27 DIAGNOSIS — I2510 Atherosclerotic heart disease of native coronary artery without angina pectoris: Secondary | ICD-10-CM | POA: Diagnosis not present

## 2016-01-27 DIAGNOSIS — Z87891 Personal history of nicotine dependence: Secondary | ICD-10-CM | POA: Diagnosis not present

## 2016-01-27 DIAGNOSIS — R300 Dysuria: Secondary | ICD-10-CM | POA: Insufficient documentation

## 2016-01-27 DIAGNOSIS — R310 Gross hematuria: Secondary | ICD-10-CM | POA: Insufficient documentation

## 2016-01-27 DIAGNOSIS — R972 Elevated prostate specific antigen [PSA]: Secondary | ICD-10-CM | POA: Diagnosis not present

## 2016-01-27 DIAGNOSIS — E1122 Type 2 diabetes mellitus with diabetic chronic kidney disease: Secondary | ICD-10-CM | POA: Diagnosis not present

## 2016-01-27 DIAGNOSIS — R402421 Glasgow coma scale score 9-12, in the field [EMT or ambulance]: Secondary | ICD-10-CM | POA: Diagnosis not present

## 2016-01-27 DIAGNOSIS — N368 Other specified disorders of urethra: Secondary | ICD-10-CM | POA: Insufficient documentation

## 2016-01-27 DIAGNOSIS — M1611 Unilateral primary osteoarthritis, right hip: Secondary | ICD-10-CM | POA: Diagnosis not present

## 2016-01-27 DIAGNOSIS — Z8673 Personal history of transient ischemic attack (TIA), and cerebral infarction without residual deficits: Secondary | ICD-10-CM | POA: Diagnosis not present

## 2016-01-27 DIAGNOSIS — I255 Ischemic cardiomyopathy: Secondary | ICD-10-CM | POA: Insufficient documentation

## 2016-01-27 DIAGNOSIS — R0602 Shortness of breath: Secondary | ICD-10-CM | POA: Diagnosis not present

## 2016-01-27 DIAGNOSIS — E119 Type 2 diabetes mellitus without complications: Secondary | ICD-10-CM

## 2016-01-27 DIAGNOSIS — I252 Old myocardial infarction: Secondary | ICD-10-CM | POA: Diagnosis not present

## 2016-01-27 DIAGNOSIS — I130 Hypertensive heart and chronic kidney disease with heart failure and stage 1 through stage 4 chronic kidney disease, or unspecified chronic kidney disease: Secondary | ICD-10-CM | POA: Insufficient documentation

## 2016-01-27 DIAGNOSIS — E162 Hypoglycemia, unspecified: Secondary | ICD-10-CM | POA: Diagnosis present

## 2016-01-27 DIAGNOSIS — I517 Cardiomegaly: Secondary | ICD-10-CM | POA: Diagnosis not present

## 2016-01-27 DIAGNOSIS — Z9841 Cataract extraction status, right eye: Secondary | ICD-10-CM | POA: Diagnosis not present

## 2016-01-27 DIAGNOSIS — E43 Unspecified severe protein-calorie malnutrition: Secondary | ICD-10-CM | POA: Diagnosis not present

## 2016-01-27 DIAGNOSIS — N184 Chronic kidney disease, stage 4 (severe): Secondary | ICD-10-CM | POA: Insufficient documentation

## 2016-01-27 LAB — URINALYSIS, ROUTINE W REFLEX MICROSCOPIC
Bilirubin Urine: NEGATIVE
Glucose, UA: NEGATIVE mg/dL
KETONES UR: NEGATIVE mg/dL
NITRITE: NEGATIVE
PROTEIN: 100 mg/dL — AB
Specific Gravity, Urine: 1.01 (ref 1.005–1.030)
pH: 7 (ref 5.0–8.0)

## 2016-01-27 LAB — CBC WITH DIFFERENTIAL/PLATELET
Basophils Absolute: 0.1 10*3/uL (ref 0.0–0.1)
Basophils Relative: 1 %
EOS ABS: 0.2 10*3/uL (ref 0.0–0.7)
Eosinophils Relative: 2 %
HEMATOCRIT: 37.5 % — AB (ref 39.0–52.0)
HEMOGLOBIN: 11.9 g/dL — AB (ref 13.0–17.0)
LYMPHS ABS: 2.4 10*3/uL (ref 0.7–4.0)
Lymphocytes Relative: 23 %
MCH: 30 pg (ref 26.0–34.0)
MCHC: 31.7 g/dL (ref 30.0–36.0)
MCV: 94.5 fL (ref 78.0–100.0)
MONOS PCT: 11 %
Monocytes Absolute: 1.1 10*3/uL — ABNORMAL HIGH (ref 0.1–1.0)
NEUTROS PCT: 63 %
Neutro Abs: 6.6 10*3/uL (ref 1.7–7.7)
Platelets: 302 10*3/uL (ref 150–400)
RBC: 3.97 MIL/uL — ABNORMAL LOW (ref 4.22–5.81)
RDW: 15.8 % — ABNORMAL HIGH (ref 11.5–15.5)
WBC: 10.5 10*3/uL (ref 4.0–10.5)

## 2016-01-27 LAB — COMPREHENSIVE METABOLIC PANEL
ALBUMIN: 3.2 g/dL — AB (ref 3.5–5.0)
ALK PHOS: 47 U/L (ref 38–126)
ALT: 14 U/L — ABNORMAL LOW (ref 17–63)
AST: 22 U/L (ref 15–41)
Anion gap: 10 (ref 5–15)
BILIRUBIN TOTAL: 0.5 mg/dL (ref 0.3–1.2)
BUN: 47 mg/dL — AB (ref 6–20)
CO2: 25 mmol/L (ref 22–32)
CREATININE: 2.68 mg/dL — AB (ref 0.61–1.24)
Calcium: 9.3 mg/dL (ref 8.9–10.3)
Chloride: 105 mmol/L (ref 101–111)
GFR calc Af Amer: 24 mL/min — ABNORMAL LOW (ref >60)
GFR calc non Af Amer: 20 mL/min — ABNORMAL LOW (ref >60)
Glucose, Bld: 125 mg/dL — ABNORMAL HIGH (ref 65–99)
Potassium: 4.9 mmol/L (ref 3.5–5.1)
Sodium: 140 mmol/L (ref 135–145)
Total Protein: 7.2 g/dL (ref 6.5–8.1)

## 2016-01-27 LAB — I-STAT CG4 LACTIC ACID, ED
Lactic Acid, Venous: 0.95 mmol/L (ref 0.5–2.0)
Lactic Acid, Venous: 1.11 mmol/L (ref 0.5–2.0)

## 2016-01-27 LAB — CBG MONITORING, ED
GLUCOSE-CAPILLARY: 112 mg/dL — AB (ref 65–99)
GLUCOSE-CAPILLARY: 24 mg/dL — AB (ref 65–99)
Glucose-Capillary: 113 mg/dL — ABNORMAL HIGH (ref 65–99)

## 2016-01-27 LAB — URINE MICROSCOPIC-ADD ON: SQUAMOUS EPITHELIAL / LPF: NONE SEEN

## 2016-01-27 LAB — I-STAT ARTERIAL BLOOD GAS, ED
ACID-BASE DEFICIT: 1 mmol/L (ref 0.0–2.0)
Bicarbonate: 24.9 mEq/L — ABNORMAL HIGH (ref 20.0–24.0)
O2 Saturation: 93 %
PCO2 ART: 45.6 mmHg — AB (ref 35.0–45.0)
TCO2: 26 mmol/L (ref 0–100)
pH, Arterial: 7.346 — ABNORMAL LOW (ref 7.350–7.450)
pO2, Arterial: 70 mmHg — ABNORMAL LOW (ref 80.0–100.0)

## 2016-01-27 LAB — GLUCOSE, CAPILLARY: Glucose-Capillary: 180 mg/dL — ABNORMAL HIGH (ref 65–99)

## 2016-01-27 MED ORDER — SODIUM CHLORIDE 0.9% FLUSH: 3.0000 mL | INTRAVENOUS | Status: DC | PRN

## 2016-01-27 MED ORDER — SODIUM CHLORIDE 0.9% FLUSH
3.0000 mL | Freq: Two times a day (BID) | INTRAVENOUS | Status: DC
  Administered 2016-01-28: 3 mL via INTRAVENOUS

## 2016-01-27 MED ORDER — CARVEDILOL 12.5 MG PO TABS
12.5000 mg | ORAL_TABLET | Freq: Two times a day (BID) | ORAL | Status: DC
  Administered 2016-01-27 – 2016-01-28 (×2): 12.5 mg via ORAL
  Filled 2016-01-27 (×2): qty 1

## 2016-01-27 MED ORDER — LIDOCAINE HCL 2 % EX GEL
1.0000 "application " | Freq: Once | CUTANEOUS | Status: AC
  Administered 2016-01-27: 1 via URETHRAL
  Filled 2016-01-27: qty 5

## 2016-01-27 MED ORDER — SODIUM CHLORIDE 0.9 % IV BOLUS (SEPSIS)
1000.0000 mL | Freq: Once | INTRAVENOUS | Status: AC
  Administered 2016-01-27: 1000 mL via INTRAVENOUS

## 2016-01-27 MED ORDER — SODIUM CHLORIDE 0.9 % IV BOLUS (SEPSIS): 250.0000 mL | Freq: Once | INTRAVENOUS | Status: DC

## 2016-01-27 MED ORDER — HEPARIN SODIUM (PORCINE) 5000 UNIT/ML IJ SOLN
5000.0000 [IU] | Freq: Three times a day (TID) | INTRAMUSCULAR | Status: DC
Start: 2016-01-27 — End: 2016-01-28
  Administered 2016-01-27 – 2016-01-28 (×3): 5000 [IU] via SUBCUTANEOUS
  Filled 2016-01-27 (×2): qty 1

## 2016-01-27 MED ORDER — FUROSEMIDE 40 MG PO TABS
40.0000 mg | ORAL_TABLET | Freq: Two times a day (BID) | ORAL | Status: DC
  Administered 2016-01-27 – 2016-01-28 (×2): 40 mg via ORAL
  Filled 2016-01-27 (×2): qty 1

## 2016-01-27 MED ORDER — ASPIRIN EC 81 MG PO TBEC
81.0000 mg | DELAYED_RELEASE_TABLET | Freq: Every day | ORAL | Status: DC
  Administered 2016-01-28: 81 mg via ORAL
  Filled 2016-01-27: qty 1

## 2016-01-27 MED ORDER — LISINOPRIL 5 MG PO TABS
5.0000 mg | ORAL_TABLET | Freq: Every day | ORAL | Status: DC
  Filled 2016-01-27 (×2): qty 1

## 2016-01-27 MED ORDER — HYDROCORTISONE 1 % EX CREA
TOPICAL_CREAM | Freq: Three times a day (TID) | CUTANEOUS | Status: DC | PRN
  Administered 2016-01-28: 1 via TOPICAL
  Filled 2016-01-27: qty 28

## 2016-01-27 MED ORDER — TAMSULOSIN HCL 0.4 MG PO CAPS
0.8000 mg | ORAL_CAPSULE | Freq: Every day | ORAL | Status: DC
  Administered 2016-01-27: 0.8 mg via ORAL
  Filled 2016-01-27: qty 2

## 2016-01-27 MED ORDER — SODIUM CHLORIDE 0.9% FLUSH: 3.0000 mL | Freq: Two times a day (BID) | INTRAVENOUS | Status: DC

## 2016-01-27 MED ORDER — PANTOPRAZOLE SODIUM 40 MG PO TBEC
40.0000 mg | DELAYED_RELEASE_TABLET | Freq: Every day | ORAL | Status: DC
Start: 2016-01-27 — End: 2016-01-28
  Administered 2016-01-28: 40 mg via ORAL
  Filled 2016-01-27: qty 1

## 2016-01-27 MED ORDER — ENSURE ENLIVE PO LIQD
237.0000 mL | Freq: Two times a day (BID) | ORAL | Status: DC
  Administered 2016-01-28: 237 mL via ORAL

## 2016-01-27 MED ORDER — ACETAMINOPHEN 500 MG PO TABS
1000.0000 mg | ORAL_TABLET | Freq: Every day | ORAL | Status: DC
  Administered 2016-01-27: 1000 mg via ORAL
  Filled 2016-01-27: qty 2

## 2016-01-27 MED ORDER — DEXTROSE 50 % IV SOLN
1.0000 | Freq: Once | INTRAVENOUS | Status: AC
  Administered 2016-01-27: 50 mL via INTRAVENOUS

## 2016-01-27 MED ORDER — DEXTROSE-NACL 5-0.45 % IV SOLN
INTRAVENOUS | Status: DC
  Administered 2016-01-27: 11:00:00 via INTRAVENOUS

## 2016-01-27 MED ORDER — ATORVASTATIN CALCIUM 40 MG PO TABS
40.0000 mg | ORAL_TABLET | Freq: Every day | ORAL | Status: DC
  Administered 2016-01-27: 40 mg via ORAL
  Filled 2016-01-27: qty 1

## 2016-01-27 MED FILL — Medication: Qty: 1 | Status: AC

## 2016-01-27 NOTE — ED Notes (Signed)
All belongings cut off patient, placed in belonging bag and given to sister. Watch and phone given to sister

## 2016-01-27 NOTE — ED Notes (Signed)
Sister at bedside Patient is alert oriented

## 2016-01-27 NOTE — ED Notes (Signed)
[facility] notified @ 1055

## 2016-01-27 NOTE — ED Notes (Signed)
Patient given a [place] sandwich to eat

## 2016-01-27 NOTE — H&P (Signed)
Date: 01/27/2016               Patient Name:  [name] MRN: [id number]  DOB: 08/23/1931 Age / Sex: 82 y.o., male   PCP: Axel Filler, MD         Medical Service: Internal Medicine Teaching Service         Attending Physician: Dr. Aldine Contes, MD    First Contact: Dr. Blane Ohara Pager: [phone]  Second Contact: Dr. Jacques Earthly Pager: [phone]       After Hours (After 5p/  First Contact Pager: [phone]  weekends / holidays): Second Contact Pager: [phone]   Chief Complaint: Hypoglycemia  History of Present Illness: [name] is an 82 year old with CAD and ischemic CM with ICD in place, [name]4, T2DM, PVD with L carotid stent 2004, and recent hospitalization for V. fib arrest s/p ROSC after CPR with AICD shock (01/13/16) who presents with hypoglycemia. He was in his usual state of health until this morning, when his sister came to pick him up for his clinic appointment and could not wake him up. She states he was snoring, breathing normally, but since she could not wake him EMS was called and CBG was checked which was in the 20s. He was given dextrose and quickly regained consciousness. He does not recall any events this morning, but does recall taking his insulin 30 units before going to bed last night. He did not eat much last night. There was no witnessed seizure-like activity or any other symptoms, including diaphoresis, dizziness, vision changes, chest pain, shortness of breath, palpitations, nausea, vomiting, diarrhea, or any focal weakness or numbness. He denies recent illness or sick contacts. He says that he had another recent episode of hypoglycemia about a week ago, and felt generalized weakness and difficulty walking at a time.  Of note, for the past hour, since she has been in the ED, he has noted significant pain and burning around his urethra at site of his urinary catheter. There is also been some blood-tinged urine in his Foley bag, which is new. He denies  any other urinary symptoms at this time.  Meds: Current Facility-Administered Medications  Medication Dose Route Frequency Provider Last Rate Last Dose  . dextrose 5 %-0.45 % sodium chloride infusion   Intravenous Continuous [name], MD 125 mL/hr at 01/27/16 1104     Current Outpatient Prescriptions  Medication Sig Dispense Refill  . acetaminophen (TYLENOL) 500 MG tablet Take 1,000 mg by mouth at bedtime.     Marland Kitchen aspirin EC 81 MG tablet Take 81 mg by mouth daily.    Marland Kitchen atorvastatin (LIPITOR) 40 MG tablet Take 1 tablet (40 mg total) by mouth daily at 6 PM. 90 tablet 0  . carvedilol (COREG) 12.5 MG tablet Take 1 tablet (12.5 mg total) by mouth 2 (two) times daily. 180 tablet 3  . furosemide (LASIX) 40 MG tablet Take 1 tablet (40 mg total) by mouth 2 (two) times daily. 180 tablet 3  . Insulin Glargine (LANTUS SOLOSTAR) 100 UNIT/ML Solostar Pen Inject 30 Units into the skin at bedtime. 15 mL 11  . lisinopril (PRINIVIL,ZESTRIL) 5 MG tablet Take 1 tablet (5 mg total) by mouth daily. 30 tablet 5  . Multiple Vitamins-Minerals (CENTRUM SILVER ADULT 50+) TABS Take by mouth daily.    . pantoprazole (PROTONIX) 40 MG tablet Take 1 tablet (40 mg total) by mouth daily. 90 tablet 0  . tamsulosin (FLOMAX) 0.4 MG CAPS capsule Take 0.8 mg by  mouth at bedtime.    Marland Kitchen ACCU-CHEK AVIVA PLUS test strip USE TO CHECK BLOOD SUGARS THREE TIMES DAILY TO FOUR TIMES DAILY 300 each 6  . B-D ULTRAFINE III SHORT PEN 31G X 8 MM MISC USE AS DIRECTED ONCE DAILY FOR INSULIN INJECTION 100 each 0  . Blood Glucose Monitoring Suppl (ACCU-CHEK AVIVA PLUS) w/Device KIT Please use 3 to 4 times daily to check blood glucose. diag code E11.9. Insulin dependent 1 kit 0  . Lancets (ACCU-CHEK MULTICLIX) lancets USE THREE TIMES DAILY TO FOUR TIMES DAILY TO CHECK BLOOD SUGAR 306 each 6  . Lancets Misc. (ACCU-CHEK FASTCLIX LANCET) KIT Please use to check blood sugar 3 to 4 times daily. diag code E11.9. Insulin dependent 1 kit 0     Allergies: Allergies as of 01/27/2016  . (No Known Allergies)   Past Medical History  Diagnosis Date  . Cataracts, bilateral   . Chronic systolic CHF (congestive heart failure) ([place])     a. ischemic CM EF 15-20%;  b. s/p AICD 05/24/04;  c. Echo 7/06: EF 30-40%, mild reduced RVSF, d. Echo 12/2015 EF 35-40%  . CAD (coronary artery disease)     a. s/p AMI, s/p PTCA & stent of cx 12/04;  b. LHC (5/05): Proximal LAD 100% with bridging collaterals (CTO), proximal circumflex 20%, mid circumflex 95% ISR, proximal-mid RCA 75%, EF 20% >>PCI: 3.0 x 28 mm Cypher DES to the mid CFX  . Diverticulosis of colon   . Hyperlipidemia   . PVD (peripheral vascular disease) ([place])     s/p L carotid PTCA/stent 2004  . Transient ischemic attack   . Erectile dysfunction   . Hyperkalemia 08/2008    K=5.7   . BBB (bundle branch block)     right  . HTN (hypertension)   . Elevated PSA   . Adenomatous colon polyp 02/14/2012  . ICD (implantable cardiac defibrillator) in place 12-25-2012    MDT CRTD upgrade by Dr Lovena Le  . Pacemaker   . Type II diabetes mellitus ([place])   . Myocardial infarction ([place]) 1990  . Arthritis     "right hip" (12/25/2012)  . Carotid stenosis     a. s/p L carotid stent 2004;  b. Carotid US (09/2014): Bilateral ICA 1-39%, left ECA >59%, normal subclavian bilaterally, occluded left vertebral >> FU 2 years  . Pericardial effusion     Echocardiogram (09/2014): EF 25% with distal anterior, distal inferior, distal lateral and apical akinesis, grade 1 diastolic dysfunction, very mild aortic stenosis (mean 7 mmHg) - this may be depressed due to low EF (2-D images suggest mild to moderate aortic stenosis), large pericardial effusion, no RA collapse  . Presence of permanent cardiac pacemaker   . AICD (automatic cardioverter/defibrillator) present   . Myocardial infarction ([place])   . Coronary artery disease   . Hypertension   . Dysrhythmia   . Peripheral vascular disease ([place])   . CHF (congestive  heart failure) ([place])   . Diabetes mellitus without complication ([place])   . Chronic kidney disease   . Anemia    Past Surgical History  Procedure Laterality Date  . Cardiac defibrillator placement  05/24/2004    Implantation of a MDT single-chamber defibrillator  . Carotid stent  09/11/2003    Percutaneous transluminal angioplasty and stent placement of the left internal carotid artery.  . Biv icd upgrade  12/25/2012    MDT CRTD upgrade by Dr Lovena Le for ischemic cardiomyopathy and worsening conduction system disease  . Cataract extraction w/  intraocular lens  implant, bilateral Bilateral ~ 2010  . Cardiac catheterization  06/2003,  01/2004  . Coronary angioplasty with stent placement  1990    "2" (12/25/2012)  . Bi-ventricular implantable cardioverter defibrillator upgrade N/A 12/25/2012    Procedure: BI-VENTRICULAR IMPLANTABLE CARDIOVERTER DEFIBRILLATOR UPGRADE;  Surgeon: Evans Lance, MD;  Location: [facility] CATH LAB;  Service: Cardiovascular;  Laterality: N/A;  . Lead revision N/A 12/25/2012    Procedure: LEAD REVISION;  Surgeon: Evans Lance, MD;  Location: [facility] CATH LAB;  Service: Cardiovascular;  Laterality: N/A;  . Insert / replace / remove pacemaker    . Cardiac catheterization N/A 01/13/2016    Procedure: Left Heart Cath and Coronary Angiography;  Surgeon: Jettie Booze, MD;  Location: [facility] CV LAB;  Service: Cardiovascular;  Laterality: N/A;   Family History  Problem Relation Age of Onset  . Diabetes Mother   . Diabetes Brother   . Heart attack Neg Hx   . Stroke Neg Hx    Social History   Social History  . Marital Status: Widowed    Spouse Name: N/A  . Number of Children: N/A  . Years of Education: 12   Occupational History  . retired    Social History Main Topics  . Smoking status: Former Smoker    Types: Cigarettes    Quit date: 12/27/1967  . Smokeless tobacco: Never Used  . Alcohol Use: No     Comment: 12/25/2012 "quit all alcohol 60 yr ago"  . Drug Use: No  .  Sexual Activity: Not on file   Other Topics Concern  . Not on file   Social History Narrative   ** Merged History Encounter **       Single, 2 adult children, daughter in [place], son in [place]: Pertinent items noted in HPI and remainder of comprehensive ROS otherwise negative.  Physical Exam: Blood pressure 142/84, pulse 83, temperature 97.5 F (36.4 C), temperature source Rectal, resp. rate 29, weight 162 lb (73.483 kg), SpO2 100 %.   Gen: Well-appearing, alert and oriented to person, place, and time HEENT: Oropharynx clear without erythema or exudate.  Neck: No cervical LAD, no thyromegaly or nodules, no JVD noted. CV: Distant heart sounds. Normal rate, regular rhythm, no murmurs, rubs, or gallops. Pacemaker present. Pulmonary: Normal effort, CTA bilaterally, no crackles or wheezes Abdominal: Soft, non-tender, non-distended, without rebound, guarding, or masses. No suprapubic tenderness noted. Extremities: Distal pulses 2+ in upper and lower extremities bilaterally, no tenderness, erythema or edema Neuro: CN II-XII grossly intact, no focal weakness or sensory deficits noted. Arcus senilis present bilaterally. Skin: No atypical appearing moles. No rashes  Lab results: Basic Metabolic Panel:  Recent Labs  01/27/16 1127  NA 140  K 4.9  CL 105  CO2 25  GLUCOSE 125*  BUN 47*  CREATININE 2.68*  CALCIUM 9.3   Liver Function Tests:  Recent Labs  01/27/16 1127  AST 22  ALT 14*  ALKPHOS 47  BILITOT 0.5  PROT 7.2  ALBUMIN 3.2*   CBC:  Recent Labs  01/27/16 1127  WBC 10.5  NEUTROABS 6.6  HGB 11.9*  HCT 37.5*  MCV 94.5  PLT 302   CBG:  Recent Labs  01/27/16 1052 01/27/16 1102 01/27/16 1444  GLUCAP 24* 112* 113*   Urinalysis:  Recent Labs  01/27/16 1233  COLORURINE YELLOW  LABSPEC 1.010  PHURINE 7.0  GLUCOSEU NEGATIVE  HGBUR SMALL*  BILIRUBINUR NEGATIVE  KETONESUR NEGATIVE  PROTEINUR 100*  NITRITE NEGATIVE   LEUKOCYTESUR LARGE*   Imaging results:  Dg Chest Port 1 View  01/27/2016  CLINICAL DATA:  Respiratory distress EXAM: PORTABLE CHEST 1 VIEW COMPARISON:  Or/26/17 FINDINGS: Defibrillator is again noted. The cardiac shadow is mildly enlarged. The lungs are well aerated bilaterally. Some increased density is noted in the medial right lung base consistent with early infiltrate/ atelectasis. No other focal abnormality is noted. IMPRESSION: Right basilar changes consistent with early infiltrate or atelectasis. Electronically Signed   By: [name] M.D.   On: 01/27/2016 11:24   Assessment & Plan by Problem: Active Problems:   Hypoglycemia 1. Hypoglycemia - difficult to wake up this morning in the setting of CBG of 24, without any other apparent symptoms or causes. While his unpredictable and generally decreased appetite in the setting of consistent insulin use is the most likely culprit, given his chronic indwelling Foley, infections such as a UTI could contribute to his hypoglycemia. However, besides urinary irritation acutely after EMS transport, he denies any urinary symptoms or fever, and his UA is negative for nitrites although she does have many bacteria and leukocytes as well as yeast which is suggestive of chronic colonization of his Foley. He is responding well to dextrose, his CBGs are now over 100 and he is fully alert, oriented, and conversant at bedside. -D5 half-normal saline at 125 mL per hour initially; will discontinue continuous fluids at this time due to his cardiac history. Patient has no signs of volume overload at this time -Start diet now -CBG checks every 3 hours -Can give D50 when necessary -EKG -Telemetry -Follow-up CBC, BMP -Hold home lantus for now; SSI  2. Urethral irritation-sustained a bladder stretch injury with urinary retention back in February this year requiring Foley placement and has had in place since. He does have a history of dysuria associated with his Foley  in the past, and was scheduled to have his Foley changed at urology visit 2 weeks ago, but there was a scheduling error so he has not seen them and is rescheduled to see them in 2 weeks. It appears that he is had this Foley in for roughly 6 weeks. Currently, his only complaint is intermittent pain around his urethra as well as hematuria from the Foley, both are which are new since coming to the ED. This is likely due to urethral irritation during EMS transport. Again, low suspicion for urinary tract infection at this time, as discussed above. -Apply topical lidocaine around the catheter site -Consider exchanging catheter in the a.m. -Continue tamsulosin  3. CAD with ischemic cardiomyopathy -Continue home aspirin, statin, carvedilol, furosemide, lisinopril  Dispo: Disposition is deferred at this time, awaiting improvement of current medical problems. Anticipated discharge in approximately 1-2 day(s).   The patient does have a current PCP [name], MD) and does need an [facility] hospital follow-up appointment after discharge.  The patient does have transportation limitations that hinder transportation to clinic appointments.  Signed: Norval Gable, MD 01/27/2016, 4:26 PM

## 2016-01-27 NOTE — Code Documentation (Signed)
Pt to ER via [facility] unresponsive. Found laying on couch by sister this morning. Pt was in hospital 3 weeks prior with cardiac arrest. Per EMS patient does have gag reflex. Pt being bagged on arrival. Remains unresponsive. CBG 62, patient is diabetic. VS per EMS - 160/70, HR 80 & regular. 20 g to [facility]. MD at bedside on arrival.

## 2016-01-27 NOTE — ED Provider Notes (Signed)
CSN: [id number]     Arrival date & time 01/27/16  1042 History   First MD Initiated Contact with Patient 01/27/16 1050     Chief Complaint  Patient presents with  . Hypoglycemia     (Consider location/radiation/quality/duration/timing/severity/associated sxs/prior Treatment) HPI 82 y.o. male with a hx of DM, CHF and a recent V. Fib arrest with CPR and ICD discharge presents to the ED after he was again found to be unresponsive this morning by family. He was found to have a pulse and was breathing, though slowly on his own. EMS found him to be minimally responsive only to painful stimuli, hypoglycemic with a glucose of 62 on scene. He had NSR on the monitor, however, and required no ACLS interventions. His airway was supported with NPA and NRB. They transported him in to the ED for further evaluation. On arrival the patient was the same with slow agonal breaths, and unresponsive to voice, but withdrawing to pain.   Level 5 caveat as history was limited to that provided by the EMS providers and chart review.   Past Medical History  Diagnosis Date  . Cataracts, bilateral   . Chronic systolic CHF (congestive heart failure) ([place])     a. ischemic CM EF 15-20%;  b. s/p AICD 05/24/04;  c. Echo 7/06: EF 30-40%, mild reduced RVSF, d. Echo 12/2015 EF 35-40%  . CAD (coronary artery disease)     a. s/p AMI, s/p PTCA & stent of cx 12/04;  b. LHC (5/05): Proximal LAD 100% with bridging collaterals (CTO), proximal circumflex 20%, mid circumflex 95% ISR, proximal-mid RCA 75%, EF 20% >>PCI: 3.0 x 28 mm Cypher DES to the mid CFX  . Diverticulosis of colon   . Hyperlipidemia   . PVD (peripheral vascular disease) ([place])     s/p L carotid PTCA/stent 2004  . Transient ischemic attack   . Erectile dysfunction   . Hyperkalemia 08/2008    K=5.7   . BBB (bundle branch block)     right  . HTN (hypertension)   . Elevated PSA   . Adenomatous colon polyp 02/14/2012  . ICD (implantable cardiac defibrillator) in  place 12-25-2012    MDT CRTD upgrade by Dr Lovena Le  . Pacemaker   . Type II diabetes mellitus ([place])   . Myocardial infarction ([place]) 1990  . Arthritis     "right hip" (12/25/2012)  . Carotid stenosis     a. s/p L carotid stent 2004;  b. Carotid US (09/2014): Bilateral ICA 1-39%, left ECA >59%, normal subclavian bilaterally, occluded left vertebral >> FU 2 years  . Pericardial effusion     Echocardiogram (09/2014): EF 25% with distal anterior, distal inferior, distal lateral and apical akinesis, grade 1 diastolic dysfunction, very mild aortic stenosis (mean 7 mmHg) - this may be depressed due to low EF (2-D images suggest mild to moderate aortic stenosis), large pericardial effusion, no RA collapse  . Presence of permanent cardiac pacemaker   . AICD (automatic cardioverter/defibrillator) present   . Myocardial infarction ([place])   . Coronary artery disease   . Hypertension   . Dysrhythmia   . Peripheral vascular disease ([place])   . CHF (congestive heart failure) ([place])   . Diabetes mellitus without complication ([place])   . Chronic kidney disease   . Anemia    Past Surgical History  Procedure Laterality Date  . Cardiac defibrillator placement  05/24/2004    Implantation of a MDT single-chamber defibrillator  . Carotid stent  09/11/2003  Percutaneous transluminal angioplasty and stent placement of the left internal carotid artery.  . Biv icd upgrade  12/25/2012    MDT CRTD upgrade by Dr Lovena Le for ischemic cardiomyopathy and worsening conduction system disease  . Cataract extraction w/ intraocular lens  implant, bilateral Bilateral ~ 2010  . Cardiac catheterization  06/2003,  01/2004  . Coronary angioplasty with stent placement  1990    "2" (12/25/2012)  . Bi-ventricular implantable cardioverter defibrillator upgrade N/A 12/25/2012    Procedure: BI-VENTRICULAR IMPLANTABLE CARDIOVERTER DEFIBRILLATOR UPGRADE;  Surgeon: Evans Lance, MD;  Location[facility] CATH LAB;  Service: Cardiovascular;  Laterality: N/A;   . Lead revision N/A 12/25/2012    Procedure: LEAD REVISION;  Surgeon: Evans Lance, MD;  Location: [facility] CATH LAB;  Service: Cardiovascular;  Laterality: N/A;  . Insert / replace / remove pacemaker    . Cardiac catheterization N/A 01/13/2016    Procedure: Left Heart Cath and Coronary Angiography;  Surgeon: Jettie Booze, MD;  Location: [place] [facility] CV LAB;  Service: Cardiovascular;  Laterality: N/A;   Family History  Problem Relation Age of Onset  . Diabetes Mother   . Diabetes Brother   . Heart attack Neg Hx   . Stroke Neg Hx    Social History  Substance Use Topics  . Smoking status: Former Smoker    Types: Cigarettes    Quit date: 12/27/1967  . Smokeless tobacco: Never Used  . Alcohol Use: No     Comment: 12/25/2012 "quit all alcohol 60 yr ago"    Review of Systems  Unable to perform ROS: Patient unresponsive      Allergies  Review of patient's allergies indicates no known allergies.  Home Medications   Prior to Admission medications   Medication Sig Start Date End Date Taking? Authorizing Provider  acetaminophen (TYLENOL) 500 MG tablet Take 1,000 mg by mouth at bedtime.    Yes Historical Provider, MD  aspirin EC 81 MG tablet Take 81 mg by mouth daily.   Yes Historical Provider, MD  atorvastatin (LIPITOR) 40 MG tablet Take 1 tablet (40 mg total) by mouth daily at 6 PM. 01/14/16  Yes Shela Leff, MD  carvedilol (COREG) 12.5 MG tablet Take 1 tablet (12.5 mg total) by mouth 2 (two) times daily. 01/21/16  Yes Rhonda G Barrett, PA-C  furosemide (LASIX) 40 MG tablet Take 1 tablet (40 mg total) by mouth 2 (two) times daily. 11/22/15  Yes Marjan Rabbani, MD  Insulin Glargine (LANTUS SOLOSTAR) 100 UNIT/ML Solostar Pen Inject 30 Units into the skin at bedtime. 05/07/15  Yes Maryellen Pile, MD  lisinopril (PRINIVIL,ZESTRIL) 5 MG tablet Take 1 tablet (5 mg total) by mouth daily. 05/07/15  Yes Maryellen Pile, MD  Multiple Vitamins-Minerals (CENTRUM SILVER ADULT 50+) TABS Take by  mouth daily.   Yes Historical Provider, MD  pantoprazole (PROTONIX) 40 MG tablet Take 1 tablet (40 mg total) by mouth daily. 01/17/16  Yes Oval Linsey, MD  tamsulosin (FLOMAX) 0.4 MG CAPS capsule Take 0.8 mg by mouth at bedtime.   Yes Historical Provider, MD  ACCU-CHEK AVIVA PLUS test strip USE TO CHECK BLOOD SUGARS THREE TIMES DAILY TO FOUR TIMES DAILY 11/24/15   Axel Filler, MD  B-D ULTRAFINE III SHORT PEN 31G X 8 MM MISC USE AS DIRECTED ONCE DAILY FOR INSULIN INJECTION 09/10/15   Axel Filler, MD  Blood Glucose Monitoring Suppl (ACCU-CHEK AVIVA PLUS) w/Device KIT Please use 3 to 4 times daily to check blood glucose. diag code E11.9. Insulin  dependent 11/23/15   Axel Filler, MD  Lancets (ACCU-CHEK MULTICLIX) lancets USE THREE TIMES DAILY TO FOUR TIMES DAILY TO CHECK BLOOD SUGAR 11/24/15   Axel Filler, MD  Lancets Misc. (ACCU-CHEK FASTCLIX LANCET) KIT Please use to check blood sugar 3 to 4 times daily. diag code E11.9. Insulin dependent 11/23/15   Axel Filler, MD   BP 142/84 mmHg  Pulse 83  Temp(Src) 97.5 F (36.4 C) (Rectal)  Resp 29  Ht _0  (1.778 m)  Wt 67.813 kg  BMI 21.45 kg/m2  SpO2 100% Physical Exam  Constitutional: He appears lethargic. He appears cachectic. He appears ill. He appears distressed.  HENT:  Head: Normocephalic and atraumatic.  Nose: Nose normal.  Mouth/Throat: Oropharynx is clear and moist.  Eyes: Conjunctivae are normal. Pupils are equal, round, and reactive to light.  Cardiovascular: Normal rate, regular rhythm, normal heart sounds and intact distal pulses.   Pulmonary/Chest: Effort normal. He has rhonchi.  Abdominal: Soft. He exhibits no distension. There is no tenderness.  Genitourinary:  Chronic in dwelling foley.  Musculoskeletal: He exhibits no edema or tenderness.  Neurological: He appears lethargic. GCS eye subscore is 1. GCS verbal subscore is 1. GCS motor subscore is 4.  Skin: Skin is warm and dry. No  rash noted.  Nursing note and vitals reviewed.   ED Course  Procedures (including critical care time) Labs Review Labs Reviewed  COMPREHENSIVE METABOLIC PANEL - Abnormal; Notable for the following:    Glucose, Bld 125 (*)    BUN 47 (*)    Creatinine, Ser 2.68 (*)    Albumin 3.2 (*)    ALT 14 (*)    GFR calc non Af Amer 20 (*)    GFR calc Af Amer 24 (*)    All other components within normal limits  CBC WITH DIFFERENTIAL/PLATELET - Abnormal; Notable for the following:    RBC 3.97 (*)    Hemoglobin 11.9 (*)    HCT 37.5 (*)    RDW 15.8 (*)    Monocytes Absolute 1.1 (*)    All other components within normal limits  URINALYSIS, ROUTINE W REFLEX MICROSCOPIC (NOT AT [facility]) - Abnormal; Notable for the following:    APPearance CLOUDY (*)    Hgb urine dipstick SMALL (*)    Protein, ur 100 (*)    Leukocytes, UA LARGE (*)    All other components within normal limits  URINE MICROSCOPIC-ADD ON - Abnormal; Notable for the following:    Bacteria, UA MANY (*)    All other components within normal limits  GLUCOSE, CAPILLARY - Abnormal; Notable for the following:    Glucose-Capillary 180 (*)    All other components within normal limits  I-STAT ARTERIAL BLOOD GAS, ED - Abnormal; Notable for the following:    pH, Arterial 7.346 (*)    pCO2 arterial 45.6 (*)    pO2, Arterial 70.0 (*)    Bicarbonate 24.9 (*)    All other components within normal limits  CBG MONITORING, ED - Abnormal; Notable for the following:    Glucose-Capillary 24 (*)    All other components within normal limits  CBG MONITORING, ED - Abnormal; Notable for the following:    Glucose-Capillary 112 (*)    All other components within normal limits  CBG MONITORING, ED - Abnormal; Notable for the following:    Glucose-Capillary 113 (*)    All other components within normal limits  CULTURE, BLOOD (ROUTINE X 2)  CULTURE, BLOOD (ROUTINE X 2)  URINE CULTURE  BASIC METABOLIC PANEL  I-STAT CG4 LACTIC ACID, ED  I-STAT CG4 LACTIC  ACID, ED    Imaging Review Dg Chest Port 1 View  01/27/2016  CLINICAL DATA:  Respiratory distress EXAM: PORTABLE CHEST 1 VIEW COMPARISON:  Or/26/17 FINDINGS: Defibrillator is again noted. The cardiac shadow is mildly enlarged. The lungs are well aerated bilaterally. Some increased density is noted in the medial right lung base consistent with early infiltrate/ atelectasis. No other focal abnormality is noted. IMPRESSION: Right basilar changes consistent with early infiltrate or atelectasis. Electronically Signed   By: [name] M.D.   On: 01/27/2016 11:24   I have personally reviewed and evaluated these images and lab results as part of my medical decision-making.   EKG Interpretation None      MDM  82 y.o. male with recent V. Fib arrest presents to the ED after he was again found unresponsive at home. En route he was found to be slightly hypoglycemic in the 60's, on arrival he was found to be persistently unresponsive GCS of 6. Sepsis labs were drawn given unknown etiology of his significant AMS. While equipment was being set up for intubation a POC glucose was checked and he was found to be profoundly hypoglycemic with a glucose of 24. He was given an amp of d50 as well as IV fluids and gradually impoved. He then began answering questions, waking up, breathing on his own, and returning to his neurologic baseline. He complained of no pain or any significant abnormalities upon waking, He stated that he had not eaten anything yet today but took his normally rx'd medication. He denies any recollection of the events leading to his presenting to the hospital. He denies any chest pain, SOB, N, V. He was started on a d5 gtt and was fed once he returned fully to his baseline mental status. Labs were drawn and returned showing AKI with Cr 2.68, BUN 47. ABG was done just before he awoke and showed pH 7.34/45.6/70.0/24.9. Normal lactate. CXR showed right basilar atelectasis vs early PNA, felt to be likely  atelectasis. EKG was done and showed V paced rhythm similar to prior with no acute evidence of ischemia. Given the patient's precipitous recovery with return to normal blood glucose, but in light of the patient's significant past medical history the decision was made to admit the patient for further care and assessment.   Final diagnoses:  Hypoglycemia      .  Zenovia Jarred, DO 01/27/16 2111  [name], MD 01/28/16 4827  [name], MD 02/26/16 [phone]

## 2016-01-27 NOTE — Progress Notes (Signed)
Bladder scan= >999 MD paged

## 2016-01-27 NOTE — ED Notes (Signed)
Urine was taken from closest port of foley bag, he came with foley already in

## 2016-01-27 NOTE — ED Notes (Signed)
Pt's CBG result was 113. Informed Claiborne Billings - RN.

## 2016-01-27 NOTE — ED Notes (Signed)
Patient c/o burning at penis, urine is now blood tinged . Lunch tray given

## 2016-01-28 DIAGNOSIS — N368 Other specified disorders of urethra: Secondary | ICD-10-CM

## 2016-01-28 DIAGNOSIS — E162 Hypoglycemia, unspecified: Secondary | ICD-10-CM | POA: Diagnosis not present

## 2016-01-28 DIAGNOSIS — E43 Unspecified severe protein-calorie malnutrition: Secondary | ICD-10-CM | POA: Insufficient documentation

## 2016-01-28 DIAGNOSIS — Z960 Presence of urogenital implants: Secondary | ICD-10-CM

## 2016-01-28 DIAGNOSIS — E11649 Type 2 diabetes mellitus with hypoglycemia without coma: Secondary | ICD-10-CM | POA: Diagnosis not present

## 2016-01-28 LAB — BASIC METABOLIC PANEL
Anion gap: 14 (ref 5–15)
BUN: 42 mg/dL — AB (ref 6–20)
CHLORIDE: 107 mmol/L (ref 101–111)
CO2: 22 mmol/L (ref 22–32)
Calcium: 9.4 mg/dL (ref 8.9–10.3)
Creatinine, Ser: 2.32 mg/dL — ABNORMAL HIGH (ref 0.61–1.24)
GFR calc Af Amer: 28 mL/min — ABNORMAL LOW (ref >60)
GFR calc non Af Amer: 24 mL/min — ABNORMAL LOW (ref >60)
GLUCOSE: 102 mg/dL — AB (ref 65–99)
POTASSIUM: 4.6 mmol/L (ref 3.5–5.1)
Sodium: 143 mmol/L (ref 135–145)

## 2016-01-28 LAB — GLUCOSE, CAPILLARY
GLUCOSE-CAPILLARY: 132 mg/dL — AB (ref 65–99)
GLUCOSE-CAPILLARY: 97 mg/dL (ref 65–99)
GLUCOSE-CAPILLARY: 172 mg/dL — AB (ref 65–99)
Glucose-Capillary: 167 mg/dL — ABNORMAL HIGH (ref 65–99)

## 2016-01-28 LAB — BLOOD CULTURE ID PANEL (REFLEXED)
ACINETOBACTER BAUMANNII: NOT DETECTED
CANDIDA GLABRATA: NOT DETECTED
Candida albicans: NOT DETECTED
Candida krusei: NOT DETECTED
Candida parapsilosis: NOT DETECTED
Candida tropicalis: NOT DETECTED
Carbapenem resistance: NOT DETECTED
ENTEROBACTER CLOACAE COMPLEX: NOT DETECTED
ESCHERICHIA COLI: NOT DETECTED
Enterobacteriaceae species: NOT DETECTED
Enterococcus species: NOT DETECTED
HAEMOPHILUS INFLUENZAE: NOT DETECTED
Klebsiella oxytoca: NOT DETECTED
Klebsiella pneumoniae: NOT DETECTED
LISTERIA MONOCYTOGENES: NOT DETECTED
METHICILLIN RESISTANCE: NOT DETECTED
NEISSERIA MENINGITIDIS: NOT DETECTED
Proteus species: NOT DETECTED
Pseudomonas aeruginosa: NOT DETECTED
SERRATIA MARCESCENS: NOT DETECTED
STAPHYLOCOCCUS AUREUS BCID: NOT DETECTED
STAPHYLOCOCCUS SPECIES: DETECTED — AB
STREPTOCOCCUS SPECIES: NOT DETECTED
Streptococcus agalactiae: NOT DETECTED
Streptococcus pneumoniae: NOT DETECTED
Streptococcus pyogenes: NOT DETECTED
VANCOMYCIN RESISTANCE: NOT DETECTED

## 2016-01-28 LAB — URINE CULTURE

## 2016-01-28 MED ORDER — INSULIN GLARGINE 100 UNIT/ML SOLOSTAR PEN: 15.0000 [IU] | PEN_INJECTOR | Freq: Every day | SUBCUTANEOUS | Status: DC

## 2016-01-28 NOTE — Progress Notes (Signed)
Initial Nutrition Assessment  DOCUMENTATION CODES:   Non-severe (moderate) malnutrition in context of chronic illness, Severe malnutrition in context of acute illness/injury  INTERVENTION:   -Continue Ensure Enlive po BID, each supplement provides 350 kcal and 20 grams of protein  NUTRITION DIAGNOSIS:   Malnutrition related to acute illness as evidenced by moderate depletion of body fat, severe depletion of muscle mass, percent weight loss.  GOAL:   Patient will meet greater than or equal to 90% of their needs  MONITOR:   PO intake, Supplement acceptance, Labs, Weight trends, Skin, I & O's  REASON FOR ASSESSMENT:   Malnutrition Screening Tool    ASSESSMENT:   [name] is an 82 year old with CAD and ischemic CM with ICD in place, [name]4, T2DM, PVD with L carotid stent 2004, and recent hospitalization for V. fib arrest s/p ROSC after CPR with AICD shock (01/13/16) who presents with hypoglycemia. He was in his usual state of health until this morning, when his sister came to pick him up for his clinic appointment and could not wake him up  Pt admitted with hypoglycemia.   Hx obtained from pt, who was sitting in recliner at time of visit. He reports that he lives independently at home, but his sister checks on him. He reports he has a great appetite and consumes 3 meals per day- he cooks breakfast and supper for himself at eats at [employer] daily for lunch. Pt also reports he has been consuming 1-2 Ensure supplements at home daily.   Pt reports UBW is 195# and that "weight just started falling off of me". He was unable to provide further details for weight hx. Reviewed wt hx, which reveals pt is usually about 180#. Pt has experienced a 17.3% wt loss over the past year and a 13.3% wt loss over the past 3 months (which is significant for time frame).   Nutrition-Focused physical exam completed. Findings are mild to moderate fat depletion, moderate to severe muscle depletion, and no  edema. Pt reports he fairly sedentary, but is able to move around his apartment without difficulty.   Pt reports concern over hypoglycemic episode, which he suspects is due to "the insulin being too strong for me". Pt was able to teach back to this RD about importance of blood sugar monitoring and was to correct hypoglycemia. Encouraged pt have sources of simple carbohydrates (orange juice, honey, hard candy, etc) on hand at all times to help prevent further hypoglycemic episodes.   Labs reviewed: CBGS: 97-116. Last Hgb A1c: 9.7 on 11/11/15.   Diet Order:  Diet Carb Modified Fluid consistency:: Thin; Room service appropriate?: Yes  Skin:  Reviewed, no issues  Last BM:  PTA  Height:   Ht Readings from Last 1 Encounters:  01/27/16 5\' 10"  (1.778 m)    Weight:   Wt Readings from Last 1 Encounters:  01/28/16 143 lb 11.8 oz (65.2 kg)    Ideal Body Weight:  75.5 kg  BMI:  Body mass index is 20.62 kg/(m^2).  Estimated Nutritional Needs:   Kcal:  [id number]  Protein:  85-95 grams  Fluid:  1.7-1.9 L  EDUCATION NEEDS:   Education needs addressed  [name] A. Jimmye Norman, RD, LDN, CDE Pager: [phone] After hours Pager: [phone]

## 2016-01-28 NOTE — Progress Notes (Signed)
Discharge instructions given to pt and family,both verbalized an understanding, pt discharged via wheelchair by volunteer services

## 2016-01-28 NOTE — Progress Notes (Signed)
Subjective: [name] had an episode of urinary retention overnight, which resolved with . This morning, he feels much better. He has no more urethral irritation or abdominal pain. He is fully alert and oriented and has had no further episodes of hypoglycemia.  Objective: Vital signs in last 24 hours: Filed Vitals:   01/27/16 2151 01/28/16 0540 01/28/16 0612 01/28/16 0949  BP: 164/81 80/39 93/42  108/52  Pulse: 88 72 68 88  Temp: 98.4 F (36.9 C) 99.1 F (37.3 C)    TempSrc: Oral Oral    Resp:  20    Height:      Weight:  143 lb 11.8 oz (65.2 kg)    SpO2: 98% 98%     Weight change:   Intake/Output Summary (Last 24 hours) at 01/28/16 1332 Last data filed at 01/28/16 1022  Gross per 24 hour  Intake    240 ml  Output   1900 ml  Net  -1660 ml     Gen: Well-appearing, alert and oriented to person, place, and time HEENT: Oropharynx clear without erythema or exudate.  Neck: No cervical LAD, no thyromegaly or nodules, no JVD noted. CV: Distant heart sounds. Normal rate, regular rhythm, no murmurs, rubs, or gallops. Pacemaker present. Pulmonary: Normal effort, CTA bilaterally, no crackles or wheezes Abdominal: Soft, non-tender, non-distended, without rebound, guarding, or masses. No suprapubic tenderness noted. Extremities: Distal pulses 2+ in upper and lower extremities bilaterally, no tenderness, erythema or edema Neuro: CN II-XII grossly intact, no focal weakness or sensory deficits noted. Arcus senilis present bilaterally. Skin: No atypical appearing moles. No rashes  Lab Results: Basic Metabolic Panel:  Recent Labs Lab 01/27/16 1127 01/28/16 0623  NA 140 143  K 4.9 4.6  CL 105 107  CO2 25 22  GLUCOSE 125* 102*  BUN 47* 42*  CREATININE 2.68* 2.32*  CALCIUM 9.3 9.4   CBG:  Recent Labs Lab 01/27/16 1444 01/27/16 2005 01/27/16 2356 01/28/16 0415 01/28/16 0800 01/28/16 1147  GLUCAP 113* 180* 167* 132* 97 172*   Assessment/Plan: 1. Hypoglycemia - difficult  to wake up this morning in the setting of CBG of 24, without any other apparent symptoms or causes. While his unpredictable and generally decreased appetite in the setting of consistent insulin use is the most likely culprit, given his chronic indwelling Foley, infections such as a UTI could contribute to his hypoglycemia. However, besides urinary irritation acutely after EMS transport, he denies any urinary symptoms or fever, and his UA is negative for nitrites although she does have many bacteria and leukocytes as well as yeast which is suggestive of chronic colonization of his Foley. He is responding well to dextrose, his CBGs are now over 100 and he is fully alert, oriented, and conversant at bedside.  -Will discharge on 1/2 home lantus dose. Now 15U QHS -Close follow-up in clinic next week  2. Urethral irritation-sustained a bladder stretch injury with urinary retention back in February this year requiring Foley placement and has had in place since. He does have a history of dysuria associated with his Foley in the past, and was scheduled to have his Foley changed at urology visit 2 weeks ago, but there was a scheduling error so he has not seen them and is rescheduled to see them in 2 weeks. It appears that he is had this Foley in for roughly 6 weeks. Currently, his only complaint is intermittent pain around his urethra as well as hematuria from the Foley, both are which are new since  coming to the ED. This is likely due to urethral irritation during EMS transport. Again, low suspicion for urinary tract infection at this time, as discussed above. Did have retention after tugging at catheter, now resolved with flushing.  -Topical lidocaine PRN -Uro follow-up in 2 weeks  Dispo: Disposition is deferred at this time, awaiting improvement of current medical problems.  Anticipated discharge this afternoon.  The patient does have a current PCP [name], MD) and does need an [facility] hospital  follow-up appointment after discharge.  The patient does not have transportation limitations that hinder transportation to clinic appointments.    Norval Gable, MD 01/28/2016, 1:32 PM

## 2016-01-30 LAB — CULTURE, BLOOD (ROUTINE X 2)

## 2016-01-30 NOTE — Discharge Summary (Signed)
Name: [name] MRN: [id number] DOB: 1930/12/13 82 y.o. PCP: Axel Filler, MD  Date of Admission: 01/27/2016 10:42 AM Date of Discharge: 01/30/2016 Attending Physician: No att. providers found  Discharge Diagnosis: 1. Hypoglycemia 2. Urethral irritation from chronic Foley catheter  Active Problems:   Hypoglycemia   Protein-calorie malnutrition, severe  Discharge Medications:   Medication List    TAKE these medications        ACCU-CHEK AVIVA PLUS test strip  Generic drug:  glucose blood  USE TO CHECK BLOOD SUGARS THREE TIMES DAILY TO FOUR TIMES DAILY     ACCU-CHEK AVIVA PLUS w/Device Kit  Please use 3 to 4 times daily to check blood glucose. diag code E11.9. Insulin dependent     ACCU-CHEK FASTCLIX LANCET Kit  Please use to check blood sugar 3 to 4 times daily. diag code E11.9. Insulin dependent     accu-chek multiclix lancets  USE THREE TIMES DAILY TO FOUR TIMES DAILY TO CHECK BLOOD SUGAR     acetaminophen 500 MG tablet  Commonly known as:  TYLENOL  Take 1,000 mg by mouth at bedtime.     aspirin EC 81 MG tablet  Take 81 mg by mouth daily.     atorvastatin 40 MG tablet  Commonly known as:  LIPITOR  Take 1 tablet (40 mg total) by mouth daily at 6 PM.     B-D ULTRAFINE III SHORT PEN 31G X 8 MM Misc  Generic drug:  Insulin Pen Needle  USE AS DIRECTED ONCE DAILY FOR INSULIN INJECTION     carvedilol 12.5 MG tablet  Commonly known as:  COREG  Take 1 tablet (12.5 mg total) by mouth 2 (two) times daily.     CENTRUM SILVER ADULT 50+ Tabs  Take by mouth daily.     furosemide 40 MG tablet  Commonly known as:  LASIX  Take 1 tablet (40 mg total) by mouth 2 (two) times daily.     Insulin Glargine 100 UNIT/ML Solostar Pen  Commonly known as:  LANTUS SOLOSTAR  Inject 15 Units into the skin at bedtime.     lisinopril 5 MG tablet  Commonly known as:  PRINIVIL,ZESTRIL  Take 1 tablet (5 mg total) by mouth daily.     pantoprazole 40 MG tablet  Commonly  known as:  PROTONIX  Take 1 tablet (40 mg total) by mouth daily.     tamsulosin 0.4 MG Caps capsule  Commonly known as:  FLOMAX  Take 0.8 mg by mouth at bedtime.        Disposition and follow-up:   RobertEsiquio E [name] was discharged from [facility] in Good condition.  At the hospital follow up visit please address:  1.  Is he having any symptoms of recurrent hypoglycemia? Is he eating better/using supplements (Ensure)? Is his urethral irritation still improved?  2.  Labs / imaging needed at time of follow-up: A1c, CBG  3.  Pending labs/ test needing follow-up: None  Follow-up Appointments:   Discharge Instructions:     Discharge Instructions    Diet Carb Modified    Complete by:  As directed      Increase activity slowly    Complete by:  As directed            Consultations:    Procedures Performed:  Dg Chest 2 View  01/19/2016  CLINICAL DATA:  82 year old male with a history of weakness EXAM: CHEST - 2 VIEW COMPARISON:  11/12/2015, abdominal CT 11/17/2015  FINDINGS: Cardiomediastinal silhouette unchanged, with cardiomegaly. Unchanged position of left chest wall cardiac pacing device/AICD. Changes of prior PTCA. Low lung volumes with opacification of the retrocardiac region. Blunting of the costophrenic sulcus on the lateral view. Coarsened interstitial markings bilaterally.  No pneumothorax. Hepatic flexure interposed between the diaphragm and liver, similar to comparison CT. IMPRESSION: Left basilar opacity, potentially a combination of pleural effusion, atelectasis, consolidation. Persisting cardiomegaly. Persisting pericardial effusion from the prior CT cannot be excluded. Atherosclerosis. Unchanged cardiac pacing device/AICD. Signed, [name]. [name], DO [facility] Electronically Signed   By: [name] D.O.   On: 01/19/2016 12:53   Ct Head Wo Contrast  01/07/2016  CLINICAL DATA:  Cardiac  arrest. EXAM: CT HEAD WITHOUT CONTRAST TECHNIQUE: Contiguous axial images were obtained from the base of the skull through the vertex without intravenous contrast. COMPARISON:  None. FINDINGS: Brain: No evidence of an acute infarct, acute hemorrhage, mass lesion, mass effect or hydrocephalus. Atrophy. Moderate. Moderate periventricular low attenuation. Apparent low-attenuation in the left basal ganglia (series 2, image 15), possibly due to volume averaging with the adjacent ventricle. Vascular: No hyperdense vessel or unexpected calcification. Skull: Negative for fracture or focal lesion. Sinuses/Orbits: Scattered mucosal thickening in the paranasal sinuses. No air-fluid levels. Mastoid air cells are clear. Other: None. IMPRESSION: 1. No acute intracranial abnormality. 2. Atrophy and chronic microvascular white matter ischemic changes. Electronically Signed   By: [name] M.D.   On: 01/07/2016 17:10   Dg Chest Port 1 View  01/27/2016  CLINICAL DATA:  Respiratory distress EXAM: PORTABLE CHEST 1 VIEW COMPARISON:  Or/26/17 FINDINGS: Defibrillator is again noted. The cardiac shadow is mildly enlarged. The lungs are well aerated bilaterally. Some increased density is noted in the medial right lung base consistent with early infiltrate/ atelectasis. No other focal abnormality is noted. IMPRESSION: Right basilar changes consistent with early infiltrate or atelectasis. Electronically Signed   By: [name] M.D.   On: 01/27/2016 11:24   Dg Chest Port 1 View  01/09/2016  CLINICAL DATA:  82 year old male with history of acute respiratory failure. EXAM: PORTABLE CHEST 1 VIEW COMPARISON:  Chest x-ray 01/08/2016. FINDINGS: An endotracheal tube is in place with tip 3.7 cm above the carina. A nasogastric tube is seen extending into the stomach, however, the tip of the nasogastric tube extends below the lower margin of the image. Left-sided biventricular pacemaker/AICD with lead tips projecting over the expected  location of the right atrium, right ventricular apex and overlying the left ventricle via the coronary sinus and coronary veins. Lung volumes are normal. Patchy opacities are noted throughout the mid to lower lungs bilaterally, which are new compared to the prior study. Probable small left pleural effusion. Mild cardiomegaly. Upper mediastinal contours are within normal limits. Atherosclerosis in the thoracic aorta. IMPRESSION: 1. Support apparatus, as above. 2. Interval development of patchy ill-defined opacities throughout the mid to lower lungs bilaterally, concerning for sequela of aspiration or developing infection. Some component of underlying atelectasis is also likely present. 3. Probable small left pleural effusion. 4. Mild cardiomegaly. Electronically Signed   By: Vinnie Langton M.D.   On: 01/09/2016 07:49   Dg Chest Port 1 View  01/08/2016  CLINICAL DATA:  82 year old male with acute respiratory failure EXAM: PORTABLE CHEST 1 VIEW COMPARISON:  Prior chest x-ray 01/07/2016 FINDINGS: The endotracheal tube is 2.4 cm above the carina. A nasogastric tube is present. The tip is coiled in the gastric fundus. Left subclavian approach biventricular  cardiac rhythm maintenance device. Leads project over the right atrium, right ventricle and overlying the left ventricle. External defibrillator pads project over the chest. Stable cardiomegaly with left heart enlargement. Metallic stent projects over the coronary artery. No pneumothorax, pleural effusion or pulmonary edema. Persistently low inspiratory volumes. IMPRESSION: 1. Interval placement of a nasogastric tube which is coiled in the gastric fundus. 2. Otherwise, stable and satisfactory support apparatus. 3. Persistent low inspiratory volumes with perhaps mild bibasilar atelectasis. 4. Cardiomegaly without evidence of failure. Electronically Signed   By: Jacqulynn Cadet M.D.   On: 01/08/2016 07:11   Dg Chest Portable 1 View  01/07/2016  CLINICAL DATA:   Intubation after cardiac arrest EXAM: PORTABLE CHEST 1 VIEW COMPARISON:  None. FINDINGS: Endotracheal tube tip is halfway between the expected location of the carina and the clavicular heads. Cardiomegaly with biventricular ICD/ pacer and coronary stent. Negative aortic and hilar contours. Left basilar density is likely from the cardiomegaly. There is no edema, consolidation, effusion, or pneumothorax. Asymmetric density at the right apex is likely osseous. Anticipate follow-up radiographs. No visible rib fracture. IMPRESSION: 1. Unremarkable positioning of the endotracheal tube. 2. Cardiomegaly without failure.  Biventricular ICD/pacer. 3. Asymmetric density at the right apex is favored osseous. After convalescence recommend two-view study. Electronically Signed   By: Monte Fantasia M.D.   On: 01/07/2016 12:55   Dg Abd Portable 1v  01/07/2016  CLINICAL DATA:  Patient status post OG tube placement. EXAM: PORTABLE ABDOMEN - 1 VIEW COMPARISON:  Abdominal radiograph 01/07/2016 FINDINGS: Enteric tube tip and side-port project over the stomach. Nonobstructed bowel gas pattern. Lumbar spine degenerative changes. IMPRESSION: Enteric tube tip and side-port project over the stomach. Electronically Signed   By: Lovey Newcomer M.D.   On: 01/07/2016 20:28   Dg Abd Portable 1v  01/07/2016  CLINICAL DATA:  Feeding tube placement EXAM: PORTABLE ABDOMEN - 1 VIEW COMPARISON:  None. FINDINGS: Feeding tube appears coiled in the esophagus. Tip not seen. Bowel gas pattern normal. IMPRESSION: Feeding tube coiled in esophagus with tip not seen. Bowel gas pattern normal. These results will be called to the ordering clinician or representative by the Radiologist Assistant, and communication documented in the PACS or zVision Dashboard. Electronically Signed   By: Lowella Grip III M.D.   On: 01/07/2016 18:53    2D Echo: None  Cardiac Cath: None  Admission HPI: Mr. Borner is an 82 year old with CAD and ischemic CM with ICD in  place, [name]4, T2DM, PVD with L carotid stent 2004, and recent hospitalization for V. fib arrest s/p ROSC after CPR with AICD shock (01/13/16) who presents with hypoglycemia. He was in his usual state of health until this morning, when his sister came to pick him up for his clinic appointment and could not wake him up. She states he was snoring, breathing normally, but since she could not wake him EMS was called and CBG was checked which was in the 20s. He was given dextrose and quickly regained consciousness. He does not recall any events this morning, but does recall taking his insulin 30 units before going to bed last night. He did not eat much last night. There was no witnessed seizure-like activity or any other symptoms, including diaphoresis, dizziness, vision changes, chest pain, shortness of breath, palpitations, nausea, vomiting, diarrhea, or any focal weakness or numbness. He denies recent illness or sick contacts. He says that he had another recent episode of hypoglycemia about a week ago, and felt generalized weakness and difficulty walking  at a time.  Of note, for the past hour, since she has been in the ED, he has noted significant pain and burning around his urethra at site of his urinary catheter. There is also been some blood-tinged urine in his Foley bag, which is new. He denies any other urinary symptoms at this time.   Hospital Course by problem list: Active Problems:   Hypoglycemia   Protein-calorie malnutrition, severe   1. Hypoglycemia - His unpredictable and generally decreased appetite in the setting of consistent insulin use was felt to be the most likely culprit, but given his chronic indwelling Foley, infections such as a UTI could have contributed to his hypoglycemia. However, besides urinary irritation acutely after EMS transport, he denied any urinary symptoms or fever, and his UA was negative for nitrites although he does have many bacteria and leukocytes as well as yeast which  is suggestive of chronic colonization of his Foley. He responded well to dextrose and a regular diet, his CBGs were over 100 and he was fully alert, oriented, and conversant at time of evaluation. He was observed overnight without recurrent hypoglycemia and discharged the next day on half his previous Lantus dose, now 15U QHS.  2. Urethral irritation from chronic Foley catheter - His only complaint at initial evaluation was intermittent pain around his urethra as well as hematuria from the Foley, both are which were new since coming to the ED. This was likely due to urethral irritation during EMS transport. Again, low suspicion for urinary tract infection at this time, as discussed above. He also had retention after tugging at the catheter, which resolved with flushing and topical Lidocaine. His blood cultures did grow coagulase-negative Staph in a single bottle, which was felt to be a contaminant given his lack of any infectious symptoms. He is to get his catheter exchanged/removed at his urology follow-up on 02/10/16.  Discharge Vitals:   BP 108/52 mmHg  Pulse 88  Temp(Src) 99.1 F (37.3 C) (Oral)  Resp 20  Ht 5' 10" (1.778 m)  Wt 143 lb 11.8 oz (65.2 kg)  BMI 20.62 kg/m2  SpO2 98%  Discharge Labs:  No results found for this or any previous visit (from the past 24 hour(s)).  Signed: Norval Gable, MD 01/30/2016, 1:39 PM    Services Ordered on Discharge: None Equipment Ordered on Discharge: None

## 2016-01-31 DIAGNOSIS — R339 Retention of urine, unspecified: Secondary | ICD-10-CM | POA: Diagnosis not present

## 2016-01-31 DIAGNOSIS — N183 Chronic kidney disease, stage 3 (moderate): Secondary | ICD-10-CM | POA: Diagnosis not present

## 2016-01-31 DIAGNOSIS — E1122 Type 2 diabetes mellitus with diabetic chronic kidney disease: Secondary | ICD-10-CM | POA: Diagnosis not present

## 2016-01-31 DIAGNOSIS — I2510 Atherosclerotic heart disease of native coronary artery without angina pectoris: Secondary | ICD-10-CM | POA: Diagnosis not present

## 2016-01-31 DIAGNOSIS — I130 Hypertensive heart and chronic kidney disease with heart failure and stage 1 through stage 4 chronic kidney disease, or unspecified chronic kidney disease: Secondary | ICD-10-CM | POA: Diagnosis not present

## 2016-01-31 DIAGNOSIS — I5043 Acute on chronic combined systolic (congestive) and diastolic (congestive) heart failure: Secondary | ICD-10-CM | POA: Diagnosis not present

## 2016-01-31 DIAGNOSIS — Z8674 Personal history of sudden cardiac arrest: Secondary | ICD-10-CM | POA: Diagnosis not present

## 2016-01-31 DIAGNOSIS — I255 Ischemic cardiomyopathy: Secondary | ICD-10-CM | POA: Diagnosis not present

## 2016-01-31 DIAGNOSIS — E1151 Type 2 diabetes mellitus with diabetic peripheral angiopathy without gangrene: Secondary | ICD-10-CM | POA: Diagnosis not present

## 2016-02-01 DIAGNOSIS — Z8674 Personal history of sudden cardiac arrest: Secondary | ICD-10-CM | POA: Diagnosis not present

## 2016-02-01 DIAGNOSIS — E1151 Type 2 diabetes mellitus with diabetic peripheral angiopathy without gangrene: Secondary | ICD-10-CM | POA: Diagnosis not present

## 2016-02-01 DIAGNOSIS — R339 Retention of urine, unspecified: Secondary | ICD-10-CM | POA: Diagnosis not present

## 2016-02-01 DIAGNOSIS — I130 Hypertensive heart and chronic kidney disease with heart failure and stage 1 through stage 4 chronic kidney disease, or unspecified chronic kidney disease: Secondary | ICD-10-CM | POA: Diagnosis not present

## 2016-02-01 DIAGNOSIS — I255 Ischemic cardiomyopathy: Secondary | ICD-10-CM | POA: Diagnosis not present

## 2016-02-01 DIAGNOSIS — I2510 Atherosclerotic heart disease of native coronary artery without angina pectoris: Secondary | ICD-10-CM | POA: Diagnosis not present

## 2016-02-01 DIAGNOSIS — N183 Chronic kidney disease, stage 3 (moderate): Secondary | ICD-10-CM | POA: Diagnosis not present

## 2016-02-01 DIAGNOSIS — E1122 Type 2 diabetes mellitus with diabetic chronic kidney disease: Secondary | ICD-10-CM | POA: Diagnosis not present

## 2016-02-01 DIAGNOSIS — I5043 Acute on chronic combined systolic (congestive) and diastolic (congestive) heart failure: Secondary | ICD-10-CM | POA: Diagnosis not present

## 2016-02-01 LAB — CULTURE, BLOOD (ROUTINE X 2): CULTURE: NO GROWTH

## 2016-02-02 ENCOUNTER — Ambulatory Visit (INDEPENDENT_AMBULATORY_CARE_PROVIDER_SITE_OTHER): Payer: Commercial Managed Care - HMO

## 2016-02-02 ENCOUNTER — Telehealth: Payer: Self-pay | Admitting: Student in an Organized Health Care Education/Training Program

## 2016-02-02 DIAGNOSIS — I5022 Chronic systolic (congestive) heart failure: Secondary | ICD-10-CM | POA: Diagnosis not present

## 2016-02-02 DIAGNOSIS — Z95810 Presence of automatic (implantable) cardiac defibrillator: Secondary | ICD-10-CM

## 2016-02-02 NOTE — Telephone Encounter (Signed)
APT. REMINDER CALL, NO ANSWER °

## 2016-02-03 ENCOUNTER — Encounter: Payer: Self-pay | Admitting: Internal Medicine

## 2016-02-03 ENCOUNTER — Ambulatory Visit (INDEPENDENT_AMBULATORY_CARE_PROVIDER_SITE_OTHER): Payer: Commercial Managed Care - HMO | Admitting: Internal Medicine

## 2016-02-03 ENCOUNTER — Telehealth: Payer: Self-pay

## 2016-02-03 VITALS — BP 129/58 | HR 66 | Temp 97.7°F | Wt 160.8 lb

## 2016-02-03 DIAGNOSIS — H9311 Tinnitus, right ear: Secondary | ICD-10-CM

## 2016-02-03 DIAGNOSIS — E119 Type 2 diabetes mellitus without complications: Secondary | ICD-10-CM

## 2016-02-03 DIAGNOSIS — Z794 Long term (current) use of insulin: Secondary | ICD-10-CM | POA: Diagnosis not present

## 2016-02-03 DIAGNOSIS — N183 Chronic kidney disease, stage 3 (moderate): Secondary | ICD-10-CM | POA: Diagnosis not present

## 2016-02-03 DIAGNOSIS — R339 Retention of urine, unspecified: Secondary | ICD-10-CM | POA: Diagnosis not present

## 2016-02-03 DIAGNOSIS — I130 Hypertensive heart and chronic kidney disease with heart failure and stage 1 through stage 4 chronic kidney disease, or unspecified chronic kidney disease: Secondary | ICD-10-CM | POA: Diagnosis not present

## 2016-02-03 DIAGNOSIS — E1151 Type 2 diabetes mellitus with diabetic peripheral angiopathy without gangrene: Secondary | ICD-10-CM | POA: Diagnosis not present

## 2016-02-03 DIAGNOSIS — I5043 Acute on chronic combined systolic (congestive) and diastolic (congestive) heart failure: Secondary | ICD-10-CM | POA: Diagnosis not present

## 2016-02-03 DIAGNOSIS — I2510 Atherosclerotic heart disease of native coronary artery without angina pectoris: Secondary | ICD-10-CM | POA: Diagnosis not present

## 2016-02-03 DIAGNOSIS — E1122 Type 2 diabetes mellitus with diabetic chronic kidney disease: Secondary | ICD-10-CM | POA: Diagnosis not present

## 2016-02-03 DIAGNOSIS — H9319 Tinnitus, unspecified ear: Secondary | ICD-10-CM | POA: Insufficient documentation

## 2016-02-03 DIAGNOSIS — Z960 Presence of urogenital implants: Secondary | ICD-10-CM | POA: Diagnosis not present

## 2016-02-03 DIAGNOSIS — I255 Ischemic cardiomyopathy: Secondary | ICD-10-CM | POA: Diagnosis not present

## 2016-02-03 DIAGNOSIS — Z7982 Long term (current) use of aspirin: Secondary | ICD-10-CM

## 2016-02-03 DIAGNOSIS — E0800 Diabetes mellitus due to underlying condition with hyperosmolarity without nonketotic hyperglycemic-hyperosmolar coma (NKHHC): Secondary | ICD-10-CM

## 2016-02-03 DIAGNOSIS — Z8674 Personal history of sudden cardiac arrest: Secondary | ICD-10-CM | POA: Diagnosis not present

## 2016-02-03 LAB — GLUCOSE, CAPILLARY: GLUCOSE-CAPILLARY: 137 mg/dL — AB (ref 65–99)

## 2016-02-03 LAB — POCT GLYCOSYLATED HEMOGLOBIN (HGB A1C): Hemoglobin A1C: 7.5

## 2016-02-03 NOTE — Assessment & Plan Note (Signed)
For the last week, he has had right-sided tenderness. I'm concerned this is vascular in origin given his severe atherosclerotic disease. He has been taking his aspirin 81 mg daily and furosemide 40 mg twice daily as prescribed; the former of course can cause tenderness, and the latter can cause ototoxicity. I don't think we should stop either of these medications. He denies any vertigo or ear fullness suggestive of Mnire's. I have referred him to audiology for formal testing, as this is quite distressful to him.

## 2016-02-03 NOTE — Progress Notes (Signed)
Patient ID: [name], male   DOB: 1931-04-22, 82 y.o.   MRN: [id number] [facility] Subjective:   Patient ID: [name] male   DOB: [birth year] 82 y.o.   MRN: [id number]  HPI: [name] is a 82 y.o. male with A relevant history of ischemic heart failure with reduced ejection fraction status-post percutaneous intervention, type 2 diabetes, and urinary retention from benign prostatic hypertrophy, here for hospital follow-up and evaluation of tinnitus.  Hospital follow-up: He was hospitalized last week for a hypoglycemic episode. He was previously taking Lantus 30 units, and was very somnolent one morning; he was found to have a CBG of 35. During hospitalization, his Lantus was held, and he clinically improved. He was discharged on Lantus 15 units nightly. Since then, he has not been checking his blood sugars, but he has not felt any hypoglycemic symptoms.  Urinary retention from benign prostatic hypertrophy: He continues to wear his Foley catheter, and has a urology follow-up appointment on May 18. They asked him to take Bactrim for 3 days before that appointment. He denies any suprapubic abdominal pain or fevers.  Right-ear tinnitus: Since he left the hospital, he has had ringing in his right ear. He is taking aspirin '81mg'$  daily and has not changed his dose of furosemide. He denies any hearing loss or vertigo. He denies any unilateral facial numbness or unilateral weakness anywhere in his body.  He is not smoking and I have reviewed his medications with him today.  Review of Systems  Constitutional: Negative for fever, chills, weight loss and malaise/fatigue.  HENT: Positive for hearing loss and tinnitus. Negative for ear discharge, ear pain and sore throat.   Respiratory: Negative for shortness of breath.   Cardiovascular: Negative for chest pain, orthopnea and leg swelling.  Gastrointestinal: Negative for abdominal pain.  Genitourinary: Negative  for hematuria.  Skin: Negative for rash.  Neurological: Negative for headaches.     Objective:  Physical Exam: Filed Vitals:   02/03/16 0913  BP: 129/58  Pulse: 66  Temp: 97.7 F (36.5 C)  TempSrc: Oral  Weight: 160 lb 12.8 oz (72.938 kg)  SpO2: 100%   General: very friendly elderly black man with cowboy hat resting in chair comfortably, appropriately conversational HEENT: no scleral icterus, extra-ocular muscles intact, oropharynx without lesions Cardiac: distant heart sounds but regular rate and rhythm, no rubs, murmurs or gallops Pulm: breathing well, clear to auscultation bilaterally Abd: bowel sounds normal, soft, nondistended, non-tender Ext: warm and well perfused, with trace pedal edema to mid-shins Lymph: no cervical or supraclavicular lymphadenopathy Neuro: alert and oriented X3, cranial nerves II-XII grossly intact, moving all extremities well  Assessment & Plan:  Case discussed with Dr. Dareen Piano  Diabetes mellitus [facility]) Since he was hospitalized for hyperglycemia, he dropped his Lantus dose from 30-15 units nightly, and he has had no more hypoglycemic episodes. He has not been checking his morning sugars, so I have asked him to do start checking. His hemoglobin A1c was much improved today at 7.5. He'll continue Lantus 15U nightly and follow-up in 1 month for further titration. I think his a1c goal should be less than 8 given his hypoglycemic episodes.  Tinnitus For the last week, he has had right-sided tenderness. I'm concerned this is vascular in origin given his severe atherosclerotic disease. He has been taking his aspirin 81 mg daily and furosemide 40 mg twice daily as prescribed; the former of course can cause tenderness, and the latter can cause  ototoxicity. I don't think we should stop either of these medications. He denies any vertigo or ear fullness suggestive of Mnire's. I have referred him to audiology for formal testing, as this is quite distressful to  him.  Urinary retention He still has his Foley catheter in place, and is scheduled to see urology on May 18 for a voiding trial. He is on tamsulosin 0.4 mg daily, denies any orthostatic hypotension. He does not have any suprapubic abdominal pain or fevers suggestive of infection. His urologist asked him to take Bactrim 3 days before the voiding trial; I advised the patient to call them to ensure this is renally dosed as his EGFR is 27.    Other Orders Orders Placed This Encounter  Procedures  . Glucose, capillary  . POCT HgB A1C (CPT [phone])   Follow Up: Return in 1-3 months, for with Dr. Evette Doffing for general follow-up and goals of care discussion.

## 2016-02-03 NOTE — Telephone Encounter (Signed)
Remote ICM transmission received.  Attempted patient call and unable to leave a message due to voice mail has not been set up.

## 2016-02-03 NOTE — Assessment & Plan Note (Signed)
Since he was hospitalized for hyperglycemia, he dropped his Lantus dose from 30-15 units nightly, and he has had no more hypoglycemic episodes. He has not been checking his morning sugars, so I have asked him to do start checking. His hemoglobin A1c was much improved today at 7.5. He'll continue Lantus 15U nightly and follow-up in 1 month for further titration. I think his a1c goal should be less than 8 given his hypoglycemic episodes.

## 2016-02-03 NOTE — Assessment & Plan Note (Signed)
He still has his Foley catheter in place, and is scheduled to see urology on May 18 for a voiding trial. He is on tamsulosin 0.4 mg daily, denies any orthostatic hypotension. He does not have any suprapubic abdominal pain or fevers suggestive of infection. His urologist asked him to take Bactrim 3 days before the voiding trial; I advised the patient to call them to ensure this is renally dosed as his EGFR is 27.

## 2016-02-03 NOTE — Progress Notes (Addendum)
EPIC Encounter for ICM Monitoring  Patient Name: [name] is a 82 y.o. male Date: 02/03/2016 Primary Care Physican: Axel Filler, MD Primary Cardiologist: [place] Electrophysiologist: Lovena Le Dry Weight: unknown  Bi-V Pacing 99.6%      In the past month, have you:  1. Gained more than 2 pounds in a day or more than 5 pounds in a week? N/A  2. Had changes in your medications (with verification of current medications)? N/A  3. Had more shortness of breath than is usual for you? N/A  4. Limited your activity because of shortness of breath? NA  5. Not been able to sleep because of shortness of breath? N/A  6. Had increased swelling in your feet, ankles, legs or stomach area? N/A  7. Had symptoms of dehydration (dizziness, dry mouth, increased thirst, decreased urine output) N/A  8. Had changes in sodium restriction? N/A  9. Been compliant with medication? N/A  ICM trend: 3 month view for 02/02/2016   ICM trend: 1 year view for 02/02/2016   Follow-up plan: ICM clinic phone appointment 02/09/2016.  Attempted 2 calls to patient and no answer and unable to leave message.    FLUID LEVELS:  Optivol thoracic impedance decreased 12/18/2015 to 02/02/2016 suggesting fluid accumulation and fluid index > threshold.     Unable to reach patient for decreased thoracic impedance and will repeat transmission on 02/09/2016.   [name], RN, CCM 02/03/2016 1:20 PM   Peter M Martinique, MD   Sent: Sun Feb 06, 2016 8:59 AM    To: [name], RN        Message     I do not follow this patient.        Peter Martinique MD, [facility]

## 2016-02-03 NOTE — Progress Notes (Signed)
Internal Medicine Clinic Attending  Case discussed with Dr. Flores at the time of the visit.  We reviewed the resident's history and exam and pertinent patient test results.  I agree with the assessment, diagnosis, and plan of care documented in the resident's note. 

## 2016-02-04 NOTE — Addendum Note (Signed)
Addended by: Carlota Raspberry on: 02/04/2016 04:25 PM   Modules accepted: Orders

## 2016-02-04 NOTE — Telephone Encounter (Signed)
Attempted ICM follow up call to patient and no answer.

## 2016-02-08 ENCOUNTER — Other Ambulatory Visit: Payer: Self-pay

## 2016-02-08 ENCOUNTER — Other Ambulatory Visit: Payer: Self-pay | Admitting: Student in an Organized Health Care Education/Training Program

## 2016-02-08 DIAGNOSIS — E1122 Type 2 diabetes mellitus with diabetic chronic kidney disease: Secondary | ICD-10-CM | POA: Diagnosis not present

## 2016-02-08 DIAGNOSIS — I255 Ischemic cardiomyopathy: Secondary | ICD-10-CM | POA: Diagnosis not present

## 2016-02-08 DIAGNOSIS — I130 Hypertensive heart and chronic kidney disease with heart failure and stage 1 through stage 4 chronic kidney disease, or unspecified chronic kidney disease: Secondary | ICD-10-CM | POA: Diagnosis not present

## 2016-02-08 DIAGNOSIS — I2510 Atherosclerotic heart disease of native coronary artery without angina pectoris: Secondary | ICD-10-CM | POA: Diagnosis not present

## 2016-02-08 DIAGNOSIS — R339 Retention of urine, unspecified: Secondary | ICD-10-CM | POA: Diagnosis not present

## 2016-02-08 DIAGNOSIS — E1151 Type 2 diabetes mellitus with diabetic peripheral angiopathy without gangrene: Secondary | ICD-10-CM | POA: Diagnosis not present

## 2016-02-08 DIAGNOSIS — N183 Chronic kidney disease, stage 3 (moderate): Secondary | ICD-10-CM | POA: Diagnosis not present

## 2016-02-08 DIAGNOSIS — Z8674 Personal history of sudden cardiac arrest: Secondary | ICD-10-CM | POA: Diagnosis not present

## 2016-02-08 DIAGNOSIS — I5043 Acute on chronic combined systolic (congestive) and diastolic (congestive) heart failure: Secondary | ICD-10-CM | POA: Diagnosis not present

## 2016-02-08 NOTE — Progress Notes (Signed)
Attempted call to patient regarding Dr Tanna Furry recommendation.  No answer and voice mail box is full, unable to leave a message.

## 2016-02-08 NOTE — Telephone Encounter (Signed)
Patient at the pharmacy asking for his medications, but pharmacy does not have any to be filled. Patient unsure of what medicines he needs. Reviewed med list an unsure of what to send for refill if anything.

## 2016-02-08 NOTE — Telephone Encounter (Signed)
Please call back Ulice Dash from [employer].

## 2016-02-08 NOTE — Progress Notes (Signed)
Evans Lance, MD   Sent: Tue Feb 08, 2016 11:04 AM    To: [name], RN        Message     Increase lasix to 80 bid for 3 days then back to 40 bid. GT    ----- Message -----

## 2016-02-09 ENCOUNTER — Other Ambulatory Visit: Payer: Self-pay | Admitting: Cardiology

## 2016-02-09 ENCOUNTER — Ambulatory Visit (INDEPENDENT_AMBULATORY_CARE_PROVIDER_SITE_OTHER): Payer: Commercial Managed Care - HMO

## 2016-02-09 DIAGNOSIS — Z95810 Presence of automatic (implantable) cardiac defibrillator: Secondary | ICD-10-CM

## 2016-02-09 DIAGNOSIS — I5022 Chronic systolic (congestive) heart failure: Secondary | ICD-10-CM

## 2016-02-09 NOTE — Telephone Encounter (Signed)
REFILL 

## 2016-02-10 ENCOUNTER — Other Ambulatory Visit: Payer: Self-pay

## 2016-02-10 DIAGNOSIS — R3914 Feeling of incomplete bladder emptying: Secondary | ICD-10-CM | POA: Diagnosis not present

## 2016-02-10 DIAGNOSIS — N401 Enlarged prostate with lower urinary tract symptoms: Secondary | ICD-10-CM | POA: Diagnosis not present

## 2016-02-10 NOTE — Progress Notes (Signed)
EPIC Encounter for ICM Monitoring  Patient Name: [name] is a 82 y.o. male Date: 02/10/2016 Primary Care Physican: Axel Filler, MD Primary Cardiologist: Lovena Le Electrophysiologist: Lovena Le Dry Weight: unknown  Bi-V Pacing 99.9%      In the past month, have you:  1. Gained more than 2 pounds in a day or more than 5 pounds in a week? no  2. Had changes in your medications (with verification of current medications)? no  3. Had more shortness of breath than is usual for you? no  4. Limited your activity because of shortness of breath? no  5. Not been able to sleep because of shortness of breath? no  6. Had increased swelling in your feet, ankles, legs or stomach area? no  7. Had symptoms of dehydration (dizziness, dry mouth, increased thirst, decreased urine output) no  8. Had changes in sodium restriction? no  9. Been compliant with medication? Yes  ICM trend: 3 month view for 02/09/2016   ICM trend: 1 year view for 02/09/2016   Follow-up plan: ICM clinic phone appointment 02/16/2016.    FLUID LEVELS:  Optivol thoracic impedance decreased 12/20/2015 to 02/09/2016 suggesting fluid accumulation.    SYMPTOMS:   None.  He denied any symptoms such as weight gain of 3 pounds overnight or 5 pounds within a week, SOB and/or lower extremity swelling and advised to report any of these symptoms to the office.      Attempted to provide patient with Dr Tanna Furry recommendation given on 02/08/2016 to increase Furosemide to 80 mg bid and then resume prescribed dosage to 40 mg bid (see ICM transmission note on 02/02/2016) but patient reported he was unable to adjust meds and did not know his medications.  He reported he takes from the bottles and puts in a pill box as indicated by a home health nurse that is no longer coming to the home.  Asked patient if he could read and he stated yes but his memory is bad.  He denied having any family or friends that could assist him with  medications.  Patient unable to increase the Furosemide at this time.  He reported needing home assistance.  Referral made to [facility] for medication assistance.    Call back to patient to advise referral has been made to [facility] for medication assistance and will determine if he is eligible.  Patient has recall appointment with Dr Lovena Le in March 2017 and advised Dr Lovena Le has an appointment tomorrow at [facility].  He agreed to the appointment and stated his brother would drive him. Advised to bring all of his med bottles to his appointment.   He reported in earlier conversation today that he had no family for medication assistance.  Patient has appointment on 02/11/2016 at [place], RN, CCM 02/10/2016 8:39 AM

## 2016-02-10 NOTE — Patient Outreach (Signed)
[facility]) Care Management  02/10/2016  [name] [birth year] [id number]   Telephone Screen  Referral Date: 02/10/16 Referral Source: MD office( Dr. Evette Doffing) Referral Reason: " medication assistance"   Outreach attempt # 1 to patient. No answer at present. Voicemail box full and unable to leave message.    Plan: RN CM will make outreach attempt to patient within a week.   [name], RN,BSN,CCM [place] [facility] Telephonic Care Management Coordinator Direct Phone: [phone] Toll Free: [phone] Fax: [phone]

## 2016-02-11 ENCOUNTER — Other Ambulatory Visit: Payer: Self-pay

## 2016-02-11 ENCOUNTER — Encounter: Payer: Self-pay | Admitting: Internal Medicine

## 2016-02-11 ENCOUNTER — Ambulatory Visit (INDEPENDENT_AMBULATORY_CARE_PROVIDER_SITE_OTHER): Payer: Commercial Managed Care - HMO | Admitting: Internal Medicine

## 2016-02-11 VITALS — BP 92/54 | HR 66 | Ht 70.0 in | Wt 158.6 lb

## 2016-02-11 DIAGNOSIS — Z95810 Presence of automatic (implantable) cardiac defibrillator: Secondary | ICD-10-CM | POA: Diagnosis not present

## 2016-02-11 DIAGNOSIS — Z8674 Personal history of sudden cardiac arrest: Secondary | ICD-10-CM | POA: Diagnosis not present

## 2016-02-11 DIAGNOSIS — E1122 Type 2 diabetes mellitus with diabetic chronic kidney disease: Secondary | ICD-10-CM | POA: Diagnosis not present

## 2016-02-11 DIAGNOSIS — R339 Retention of urine, unspecified: Secondary | ICD-10-CM | POA: Diagnosis not present

## 2016-02-11 DIAGNOSIS — I5022 Chronic systolic (congestive) heart failure: Secondary | ICD-10-CM

## 2016-02-11 DIAGNOSIS — I472 Ventricular tachycardia, unspecified: Secondary | ICD-10-CM

## 2016-02-11 DIAGNOSIS — I2510 Atherosclerotic heart disease of native coronary artery without angina pectoris: Secondary | ICD-10-CM | POA: Diagnosis not present

## 2016-02-11 DIAGNOSIS — E1151 Type 2 diabetes mellitus with diabetic peripheral angiopathy without gangrene: Secondary | ICD-10-CM | POA: Diagnosis not present

## 2016-02-11 DIAGNOSIS — I130 Hypertensive heart and chronic kidney disease with heart failure and stage 1 through stage 4 chronic kidney disease, or unspecified chronic kidney disease: Secondary | ICD-10-CM | POA: Diagnosis not present

## 2016-02-11 DIAGNOSIS — I509 Heart failure, unspecified: Secondary | ICD-10-CM

## 2016-02-11 DIAGNOSIS — I5043 Acute on chronic combined systolic (congestive) and diastolic (congestive) heart failure: Secondary | ICD-10-CM | POA: Diagnosis not present

## 2016-02-11 DIAGNOSIS — I255 Ischemic cardiomyopathy: Secondary | ICD-10-CM | POA: Diagnosis not present

## 2016-02-11 DIAGNOSIS — N183 Chronic kidney disease, stage 3 (moderate): Secondary | ICD-10-CM | POA: Diagnosis not present

## 2016-02-11 LAB — CUP PACEART INCLINIC DEVICE CHECK
Battery Voltage: 2.97 V
Brady Statistic AS VP Percent: 91.84 %
Brady Statistic AS VS Percent: 0.02 %
Brady Statistic RA Percent Paced: 8.14 %
HIGH POWER IMPEDANCE MEASURED VALUE: 57 Ohm
Implantable Lead Implant Date: 20140402
Implantable Lead Location: 753858
Implantable Lead Model: 419488
Implantable Lead Model: 6935
Lead Channel Impedance Value: 399 Ohm
Lead Channel Impedance Value: 475 Ohm
Lead Channel Pacing Threshold Amplitude: 0.75 V
Lead Channel Pacing Threshold Amplitude: 0.75 V
Lead Channel Pacing Threshold Pulse Width: 0.4 ms
Lead Channel Pacing Threshold Pulse Width: 0.6 ms
Lead Channel Setting Pacing Amplitude: 1.5 V
Lead Channel Setting Pacing Amplitude: 2 V
Lead Channel Setting Pacing Amplitude: 2.5 V
Lead Channel Setting Pacing Pulse Width: 0.4 ms
Lead Channel Setting Pacing Pulse Width: 0.6 ms
MDC IDC LEAD IMPLANT DT: 20140402
MDC IDC LEAD IMPLANT DT: 20140402
MDC IDC LEAD LOCATION: 753859
MDC IDC LEAD LOCATION: 753860
MDC IDC MSMT BATTERY REMAINING LONGEVITY: 59 mo
MDC IDC MSMT LEADCHNL LV IMPEDANCE VALUE: 285 Ohm
MDC IDC MSMT LEADCHNL LV IMPEDANCE VALUE: 551 Ohm
MDC IDC MSMT LEADCHNL LV IMPEDANCE VALUE: 703 Ohm
MDC IDC MSMT LEADCHNL RA IMPEDANCE VALUE: 418 Ohm
MDC IDC MSMT LEADCHNL RA PACING THRESHOLD AMPLITUDE: 0.75 V
MDC IDC MSMT LEADCHNL RA PACING THRESHOLD PULSEWIDTH: 0.4 ms
MDC IDC MSMT LEADCHNL RA SENSING INTR AMPL: 5 mV
MDC IDC MSMT LEADCHNL RV SENSING INTR AMPL: 18.75 mV
MDC IDC SESS DTM: 20170519133817
MDC IDC SET LEADCHNL RV SENSING SENSITIVITY: 0.3 mV
MDC IDC STAT BRADY AP VP PERCENT: 8.13 %
MDC IDC STAT BRADY AP VS PERCENT: 0.01 %
MDC IDC STAT BRADY RV PERCENT PACED: 99.66 %

## 2016-02-11 MED ORDER — CARVEDILOL 12.5 MG PO TABS: 6.2500 mg | ORAL_TABLET | Freq: Two times a day (BID) | ORAL | Status: DC

## 2016-02-11 NOTE — Patient Instructions (Signed)
Medication Instructions:  Your physician has recommended you make the following change in your medication: 1) Decrease Carvedilol to 6.25 mg twice daily   Labwork: None ordered   Testing/Procedures: None ordered   Follow-Up: Your physician wants you to follow-up as scheduled with Dr Lovena Le   Any Other Special Instructions Will Be Listed Below (If Applicable).     If you need a refill on your cardiac medications before your next appointment, please call your pharmacy.

## 2016-02-11 NOTE — Patient Outreach (Signed)
Unsuccessful attempt was made to contact patient via telephone to schedule appointment for home visit. Outgoing message indicated mailbox was full, unable to leave HIPPA compliant message.  Plan: Make another attempt to call patient next week

## 2016-02-11 NOTE — Progress Notes (Signed)
HPI [name] returns today for followup. He is a pleasant 82 yo man with a h/o an ischemic cardiomyopathy, chronic systolic heart failure, ejection fraction 20%, complete heart block, status post biventricular ICD. In the interim, the patient has been stable. He underwent ICD generator removal and upgrade to a biventricular device over 2 years ago. He denies chest pain, syncope, or ICD shock. He has class II congestive heart failure. He has mild peripheral edema. He admits to dietary indiscretion. He was found to have a pericardial effusion of uncertain etiology. This has been followed conservatively. He was in the hospital several weeks ago with respiratory failure. He has had a slow improvement and is still quite sedentary. No Known Allergies   Current Outpatient Prescriptions  Medication Sig Dispense Refill  . acetaminophen (TYLENOL) 500 MG tablet Take 1,000 mg by mouth at bedtime.     Marland Kitchen aspirin EC 81 MG tablet Take 81 mg by mouth daily.    Marland Kitchen atorvastatin (LIPITOR) 40 MG tablet Take 1 tablet (40 mg total) by mouth daily at 6 PM. 90 tablet 0  . B-D ULTRAFINE III SHORT PEN 31G X 8 MM MISC USE AS DIRECTED ONCE DAILY FOR INSULIN INJECTION 100 each 3  . Blood Glucose Monitoring Suppl (ACCU-CHEK AVIVA PLUS) w/Device KIT Please use 3 to 4 times daily to check blood glucose. diag code E11.9. Insulin dependent 1 kit 0  . carvedilol (COREG) 12.5 MG tablet Take 1 tablet (12.5 mg total) by mouth 2 (two) times daily. 180 tablet 3  . furosemide (LASIX) 40 MG tablet TAKE 1 TABLET BY MOUTH TWICE DAILY 180 tablet 0  . Insulin Glargine (LANTUS SOLOSTAR) 100 UNIT/ML Solostar Pen Inject 15 Units into the skin at bedtime. 15 mL 11  . Lancets (ACCU-CHEK MULTICLIX) lancets USE THREE TIMES DAILY TO FOUR TIMES DAILY TO CHECK BLOOD SUGAR 306 each 6  . Lancets Misc. (ACCU-CHEK FASTCLIX LANCET) KIT Please use to check blood sugar 3 to 4 times daily. diag code E11.9. Insulin dependent 1 kit 0  . lisinopril  (PRINIVIL,ZESTRIL) 5 MG tablet Take 1 tablet (5 mg total) by mouth daily. 30 tablet 5  . Multiple Vitamins-Minerals (CENTRUM SILVER ADULT 50+) TABS Take by mouth daily.    . pantoprazole (PROTONIX) 40 MG tablet Take 1 tablet (40 mg total) by mouth daily. 90 tablet 0  . tamsulosin (FLOMAX) 0.4 MG CAPS capsule Take 0.8 mg by mouth at bedtime.    Marland Kitchen ACCU-CHEK AVIVA PLUS test strip USE TO CHECK BLOOD SUGARS THREE TIMES DAILY TO FOUR TIMES DAILY 300 each 6   No current facility-administered medications for this visit.     Past Medical History  Diagnosis Date  . Cataracts, bilateral   . Chronic systolic CHF (congestive heart failure) ([place])     a. ischemic CM EF 15-20%;  b. s/p AICD 05/24/04;  c. Echo 7/06: EF 30-40%, mild reduced RVSF, d. Echo 12/2015 EF 35-40%  . CAD (coronary artery disease)     a. s/p AMI, s/p PTCA & stent of cx 12/04;  b. LHC (5/05): Proximal LAD 100% with bridging collaterals (CTO), proximal circumflex 20%, mid circumflex 95% ISR, proximal-mid RCA 75%, EF 20% >>PCI: 3.0 x 28 mm Cypher DES to the mid CFX  . Diverticulosis of colon   . Hyperlipidemia   . PVD (peripheral vascular disease) ([place])     s/p L carotid PTCA/stent 2004  . Transient ischemic attack   . Erectile dysfunction   . Hyperkalemia 08/2008  K=5.7   . BBB (bundle branch block)     right  . HTN (hypertension)   . Elevated PSA   . Adenomatous colon polyp 02/14/2012  . ICD (implantable cardiac defibrillator) in place 12-25-2012    MDT CRTD upgrade by Dr Lovena Le  . Pacemaker   . Type II diabetes mellitus ([place])   . Myocardial infarction ([place]) 1990  . Arthritis     "right hip" (12/25/2012)  . Carotid stenosis     a. s/p L carotid stent 2004;  b. Carotid US (09/2014): Bilateral ICA 1-39%, left ECA >59%, normal subclavian bilaterally, occluded left vertebral >> FU 2 years  . Pericardial effusion     Echocardiogram (09/2014): EF 25% with distal anterior, distal inferior, distal lateral and apical akinesis, grade 1  diastolic dysfunction, very mild aortic stenosis (mean 7 mmHg) - this may be depressed due to low EF (2-D images suggest mild to moderate aortic stenosis), large pericardial effusion, no RA collapse  . Presence of permanent cardiac pacemaker   . AICD (automatic cardioverter/defibrillator) present   . Myocardial infarction ([place])   . Coronary artery disease   . Hypertension   . Dysrhythmia   . Peripheral vascular disease ([place])   . CHF (congestive heart failure) ([place])   . Diabetes mellitus without complication ([place])   . Chronic kidney disease   . Anemia     ROS:   All systems reviewed and negative except as noted in the HPI.   Past Surgical History  Procedure Laterality Date  . Cardiac defibrillator placement  05/24/2004    Implantation of a MDT single-chamber defibrillator  . Carotid stent  09/11/2003    Percutaneous transluminal angioplasty and stent placement of the left internal carotid artery.  . Biv icd upgrade  12/25/2012    MDT CRTD upgrade by Dr Lovena Le for ischemic cardiomyopathy and worsening conduction system disease  . Cataract extraction w/ intraocular lens  implant, bilateral Bilateral ~ 2010  . Cardiac catheterization  06/2003,  01/2004  . Coronary angioplasty with stent placement  1990    "2" (12/25/2012)  . Bi-ventricular implantable cardioverter defibrillator upgrade N/A 12/25/2012    Procedure: BI-VENTRICULAR IMPLANTABLE CARDIOVERTER DEFIBRILLATOR UPGRADE;  Surgeon: Evans Lance, MD;  Location: [facility] CATH LAB;  Service: Cardiovascular;  Laterality: N/A;  . Lead revision N/A 12/25/2012    Procedure: LEAD REVISION;  Surgeon: Evans Lance, MD;  Location[facility] CATH LAB;  Service: Cardiovascular;  Laterality: N/A;  . Insert / replace / remove pacemaker    . Cardiac catheterization N/A 01/13/2016    Procedure: Left Heart Cath and Coronary Angiography;  Surgeon: Jettie Booze, MD;  Location: [facility] CV LAB;  Service: Cardiovascular;  Laterality: N/A;     Family History   Problem Relation Age of Onset  . Diabetes Mother   . Diabetes Brother   . Heart attack Neg Hx   . Stroke Neg Hx      Social History   Social History  . Marital Status: Widowed    Spouse Name: N/A  . Number of Children: N/A  . Years of Education: 12   Occupational History  . retired    Social History Main Topics  . Smoking status: Former Smoker    Types: Cigarettes    Quit date: 12/27/1967  . Smokeless tobacco: Never Used  . Alcohol Use: No     Comment: 12/25/2012 "quit all alcohol 60 yr ago"  . Drug Use: No  . Sexual Activity: Not on file  Other Topics Concern  . Not on file   Social History Narrative   ** Merged History Encounter **       Single, 2 adult children, daughter in [place], son in [place]     BP 92/54 mmHg  Pulse 66  Ht '5\' 10"'$  (1.778 m)  Wt 158 lb 9.6 oz (71.94 kg)  BMI 22.76 kg/m2  SpO2 99%  Physical Exam:  Stable but chronically ill appearing 82 year old man, NAD HEENT: Unremarkable Neck:  7 cm JVD, no thyromegally Lungs:  Clear with no wheezes, rales, or rhonchi. HEART:  Regular rate rhythm, no murmurs, no rubs, no clicks Abd:  soft, positive bowel sounds, no organomegally, no rebound, no guarding Ext:  2 plus pulses, 2+ peripheral edema, no cyanosis, no clubbing Skin:  No rashes no nodules Neuro:  CN II through XII intact, motor grossly intact   DEVICE  Normal device function.  See PaceArt for details.   Assess/Plan: 1. Chronic systolic heart failure - he is sedentary and I have asked him to reduce his salt intake. He will reduce his dose of Coreg to 6.25 bid as his blood pressure is low. 2. CAD - he has had no progression since his last cath. He denies angina. 3. VT - he has had no recurrent VT therapies since his last admit for VT. No change in meds. 4. ICD - his Medtronic BiV ICD is working normally. Will follow.  Mikle Bosworth.D.

## 2016-02-11 NOTE — Patient Outreach (Signed)
[name]) Care Management  02/11/2016  [name] 28-Jul-1931 [id number]   Telephone Screen  Referral Date: 02/10/16 Referral Source: MD office( Dr. Evette Doffing) Referral Reason: " medication assistance"    Incoming call from patient. Screening completed.  Social: Patient states he resides in his senior living apartment alone. He has a sister that lives nearby and assists when needed. He is independent with his ADLs and IADLs. He drives himself to his appts or his sister takes him. He denies any recent falls. DME in the home include rollator,walker and cane.   Conditions: Patient has h/o atherosclerotic disease, CHF, benign prostatic hypertrophy, CHF and DM. He has foley catheter in place and reports possible removal on next week. Patient states he is weighing only about once a week. Ocassional edema at times. He reports that he is monitoring his blood sugars every am. Last cbg was 150. Patient with recent hospitalization from 01/27/16-01/30/16 for hypoglycemia per records. Last A1C was 7.5 per chart review.  Medications: Patient states he is taking about 8 meds. He denies any issues with affordability. He does report that he is having difficulty managing meds. Patient states that he self fills his med planner weekly. However, he voices concerns regarding him not really knowing his med and forgetting at times to take meds.   Consent: Patient gave verbal consent for [facility] services.  Plan: RN CM will notify [name] administrative assistant of case status. RN CM will send [name] community referral for TOC. RN CM will send [name] pharmacy referral for medication management and adherence assistance.   [name], RN,BSN,CCM [place] [facility] Telephonic Care Management Coordinator Direct Phone: [phone] Toll Free: [phone] Fax: [phone]

## 2016-02-11 NOTE — Patient Outreach (Signed)
[facility]) Care Management  02/11/2016  [name] Jul 25, 1931 [id number]  Care Coordination   Telephone Screen  Referral Date: 02/10/16 Referral Source: MD office( Dr. Evette Doffing) Referral Reason: " medication assistance"   Voicemail message received from [place] short at [facility] advising that patient was currently in the office for appt. Staff inquiring if okay to provide patient with RN CM contact info as RN CM has been trying to contact patient. Return call placed. Advised that it was okay to provide patient with RN CM direct number to contact. Staff voices some concerns regarding med adherence and possibly patient remembering or understanding how to take meds. Advised that [facility] services can assist with this and same special med planners that patient may benefit from.   Plan: RN CM will await patient's call and make outreach attempt to patient if no response from him within a week.   [name], RN,BSN,CCM [place] Management Telephonic Care Management Coordinator Direct Phone: [phone] Toll Free: [phone] Fax: [phone]

## 2016-02-14 ENCOUNTER — Other Ambulatory Visit: Payer: Self-pay

## 2016-02-14 ENCOUNTER — Ambulatory Visit: Payer: Self-pay

## 2016-02-14 MED ORDER — ALBUTEROL SULFATE HFA 108 (90 BASE) MCG/ACT IN AERS: 2.0000 | INHALATION_SPRAY | Freq: Four times a day (QID) | RESPIRATORY_TRACT | Status: DC | PRN

## 2016-02-15 ENCOUNTER — Other Ambulatory Visit: Payer: Self-pay

## 2016-02-15 DIAGNOSIS — I5041 Acute combined systolic (congestive) and diastolic (congestive) heart failure: Secondary | ICD-10-CM

## 2016-02-15 NOTE — Progress Notes (Signed)
This encounter was created in error - please disregard.

## 2016-02-15 NOTE — Patient Outreach (Signed)
Initial telephone contact for community care coordination. Patient identified himself by providing his name, date of birth and address. Patient was referred to [facility] care for chronic disease education, community referral and medication compliance.    Referral made to [facility] for medication compliance and education. Patient and this RNCM agreed to home visit next week for creation of case management care plan, chronic disease education and assessment of need for community care coordination.  Patient has difficult time hearing over the telephone so call was limted

## 2016-02-15 NOTE — Patient Outreach (Signed)
This RNCM received message from [place], [facility] CM with request to call patient. Unsuccessful attempt made 3 times to reach patient at [phone]. Outgoing message indicated mailbox was full and unable to leave message.

## 2016-02-16 ENCOUNTER — Telehealth: Payer: Self-pay | Admitting: Cardiology

## 2016-02-16 NOTE — Telephone Encounter (Signed)
Attempted to confirm remote transmission with pt. No answer and was unable to leave a message.   

## 2016-02-17 DIAGNOSIS — R338 Other retention of urine: Secondary | ICD-10-CM | POA: Diagnosis not present

## 2016-02-17 DIAGNOSIS — N1370 Vesicoureteral-reflux, unspecified: Secondary | ICD-10-CM | POA: Diagnosis not present

## 2016-02-17 DIAGNOSIS — R972 Elevated prostate specific antigen [PSA]: Secondary | ICD-10-CM | POA: Diagnosis not present

## 2016-02-17 DIAGNOSIS — N401 Enlarged prostate with lower urinary tract symptoms: Secondary | ICD-10-CM | POA: Diagnosis not present

## 2016-02-18 ENCOUNTER — Telehealth: Payer: Self-pay | Admitting: Internal Medicine

## 2016-02-18 NOTE — Progress Notes (Signed)
No ICM remote transmission received 02/16/2016.  Next ICM remote transmission 03/07/2016.

## 2016-02-18 NOTE — Telephone Encounter (Signed)
[name] would like a note stating weather it is okay to go through a metal detector.  Please call patient when ready to be picked up.

## 2016-02-19 ENCOUNTER — Other Ambulatory Visit: Payer: Self-pay | Admitting: Internal Medicine

## 2016-02-22 ENCOUNTER — Encounter: Payer: Self-pay | Admitting: *Deleted

## 2016-02-22 ENCOUNTER — Other Ambulatory Visit: Payer: Self-pay

## 2016-02-22 NOTE — Patient Outreach (Signed)
[facility]) Care Management  02/22/2016  [name] 03/11/1931 [id number]   Arrived at patient's home for this scheduled home visit. Knocked several times without success in getting answer to the door. Made several telephone attempts to contact patient, however, attempts were unsuccessful. Advised maintenance for apartment complex that patient did not answer door, appointment had been scheduled from last week. Maintenance personnel went into patient's apartment, came back out to inform this RNCM patient as not in his apartment.  Message left on patient's door with this RNCM's contact information as his outgoing message indicates his voicemail was full. Will make another attempt to contact patient in June 2017

## 2016-02-22 NOTE — Telephone Encounter (Signed)
Patient walked in office stating that he needed a letter saying that he could walk through a metal detector because he [place] this [place].   Called Medtr[employer] tech support to get specific recommendations.   Tech support stated that patient is able to walk through the detector at a normal pace without the fear of oversensing EMI. Tech support also stated that patient may be wanded as well.   Letter with recommendations given to patient.

## 2016-02-23 ENCOUNTER — Other Ambulatory Visit: Payer: Self-pay | Admitting: Pharmacist

## 2016-02-23 NOTE — Patient Outreach (Signed)
[name]  02/23/2016  [name] [birth year] [id number]   [name] is an 82 yo who was referred to [place] for medication [name].  Phone call to patient to schedule initial home visit.  There was no answer.  I was unable to leave a voicemail as patient's voicemail box was full.  I will make a second outreach attempt within one week.    Elisabeth Most, Pharm.D. Pharmacy Resident [place] [phone]

## 2016-02-25 ENCOUNTER — Other Ambulatory Visit: Payer: Self-pay | Admitting: Pharmacist

## 2016-02-25 NOTE — Patient Outreach (Signed)
[facility] [facility]) Care Management  02/25/2016  [name] [birth year] [id number]   [name] is an 82 yo who was referred to [place] for medication management.  Phone call to patient to schedule initial home visit.  There was no answer.  I was unable to leave a voicemail as patient's voicemail box was full.  I will make a third outreach attempt within one week.    Elisabeth Most, Pharm.D. Pharmacy Resident [place] [phone]

## 2016-02-28 ENCOUNTER — Encounter: Payer: Self-pay | Admitting: Pharmacist

## 2016-02-28 ENCOUNTER — Other Ambulatory Visit: Payer: Self-pay | Admitting: Pharmacist

## 2016-02-28 NOTE — Patient Outreach (Signed)
[facility]) Care Management  02/28/2016  GAZA LOREDO 1931/01/21 [id number]   Kieffer Adi is an 82 yo who was referred to [facility] for medication management.  Phone call to patient to schedule initial home visit.  There was no answer.  I was unable to leave a voicemail as patient's voicemail box is full.  I will send an unsuccessful outreach letter per protocol as I have made three attempts to reach patient without success.     Elisabeth Most, Pharm.D. Pharmacy Resident [place] [phone]

## 2016-02-29 DIAGNOSIS — D3102 Benign neoplasm of left conjunctiva: Secondary | ICD-10-CM | POA: Diagnosis not present

## 2016-02-29 DIAGNOSIS — Z961 Presence of intraocular lens: Secondary | ICD-10-CM | POA: Diagnosis not present

## 2016-02-29 DIAGNOSIS — H16143 Punctate keratitis, bilateral: Secondary | ICD-10-CM | POA: Diagnosis not present

## 2016-02-29 DIAGNOSIS — D210 Benign neoplasm of connective and other soft tissue of head, face and neck: Secondary | ICD-10-CM | POA: Diagnosis not present

## 2016-03-07 ENCOUNTER — Ambulatory Visit (INDEPENDENT_AMBULATORY_CARE_PROVIDER_SITE_OTHER): Payer: Commercial Managed Care - HMO

## 2016-03-07 ENCOUNTER — Other Ambulatory Visit: Payer: Self-pay

## 2016-03-07 DIAGNOSIS — Z95810 Presence of automatic (implantable) cardiac defibrillator: Secondary | ICD-10-CM

## 2016-03-07 DIAGNOSIS — I5022 Chronic systolic (congestive) heart failure: Secondary | ICD-10-CM

## 2016-03-07 NOTE — Progress Notes (Signed)
EPIC Encounter for ICM Monitoring  Patient Name: [name] is a 82 y.o. male Date: 03/07/2016 Primary Care Physican: Axel Filler, MD Primary Cardiologist: Lovena Le Electrophysiologist: Lovena Le Dry Weight: unknown   Bi-V Pacing 99.9%      In the past month, have you:  1. Gained more than 2 pounds in a day or more than 5 pounds in a week? N/A  2. Had changes in your medications (with verification of current medications)? N/A  3. Had more shortness of breath than is usual for you? N/A  4. Limited your activity because of shortness of breath? N/A  5. Not been able to sleep because of shortness of breath? N/A  6. Had increased swelling in your feet or ankles? N/A  7. Had symptoms of dehydration (dizziness, dry mouth, increased thirst, decreased urine output) N/A  8. Had changes in sodium restriction? N/A  9. Been compliant with medication? N/A   ICM trend: 3 month view for 03/07/2016   ICM trend: 1 year view for 03/07/2016   Follow-up plan: ICM clinic phone appointment on 05/23/2016 since he has office appointment with Dr Lovena Le for defib check on 04/21/2016.  Attempted call to patient and unable to reach.  Transmission reviewed.  Thoracic impedance continues below reference line from suggesting fluid accumulation but there is improvement in the last month.   Patient was referred to [facility] for assistance with medications but he has not responded to calls and did not keep home visit appointment.     [name], RN, CCM 03/07/2016 9:33 AM

## 2016-03-07 NOTE — Patient Outreach (Signed)
Attempted to outreach patient at home at [phone] with no success. Unable to leave HIPAA compliant voicemail because patient's voicemail box is full.   Loletta Specter, RN, BSN, CCM

## 2016-03-08 ENCOUNTER — Other Ambulatory Visit: Payer: Self-pay

## 2016-03-09 NOTE — Patient Outreach (Addendum)
Attempted to outreach patient at home at [phone] with no success. Unable to leave HIPAA compliant voicemail because patient's voicemail box is full.   Will refer patient back to [place]  for further follow-up.  [place] R. Tarek Cravens, RN, BSN, [place] Management Coordinator [phone]

## 2016-03-13 ENCOUNTER — Other Ambulatory Visit: Payer: Self-pay | Admitting: Pharmacist

## 2016-03-13 ENCOUNTER — Encounter: Payer: Self-pay | Admitting: Pharmacist

## 2016-03-13 NOTE — Patient Outreach (Addendum)
[facility]) Care Management  [place]   03/13/2016  DELFINO FRIESEN Mar 26, 1931 [id number]  Subjective: Dempsey Ahonen is an 82 yo who was referred to [place] for medication management.  I made three unsuccessful attempts to contact the patient via telephone and mailed a patient outreach letter on 02/28/16 with no response.  I will close pharmacy program per protocol at this time.    Objective:  Medication review completed using patient's medication list in the [facility] [place] [facility].    Encounter Medications: Outpatient Encounter Prescriptions as of 03/13/2016  Medication Sig  . ACCU-CHEK AVIVA PLUS test strip USE TO CHECK BLOOD SUGARS THREE TIMES DAILY TO FOUR TIMES DAILY  . acetaminophen (TYLENOL) 500 MG tablet Take 1,000 mg by mouth at bedtime.   Marland Kitchen albuterol (PROVENTIL HFA;VENTOLIN HFA) 108 (90 Base) MCG/ACT inhaler Inhale 2 puffs into the lungs every 6 (six) hours as needed for wheezing or shortness of breath.  Marland Kitchen aspirin EC 81 MG tablet Take 81 mg by mouth daily.  Marland Kitchen atorvastatin (LIPITOR) 40 MG tablet Take 1 tablet (40 mg total) by mouth daily at 6 PM.  . B-D ULTRAFINE III SHORT PEN 31G X 8 MM MISC USE AS DIRECTED ONCE DAILY FOR INSULIN INJECTION  . Blood Glucose Monitoring Suppl (ACCU-CHEK AVIVA PLUS) w/Device KIT Please use 3 to 4 times daily to check blood glucose. diag code E11.9. Insulin dependent  . carvedilol (COREG) 12.5 MG tablet Take 0.5 tablets (6.25 mg total) by mouth 2 (two) times daily.  . furosemide (LASIX) 40 MG tablet TAKE 1 TABLET BY MOUTH TWICE DAILY  . Insulin Glargine (LANTUS SOLOSTAR) 100 UNIT/ML Solostar Pen Inject 15 Units into the skin at bedtime.  . Lancets (ACCU-CHEK MULTICLIX) lancets USE THREE TIMES DAILY TO FOUR TIMES DAILY TO CHECK BLOOD SUGAR  . Lancets Misc. (ACCU-CHEK FASTCLIX LANCET) KIT Please use to check blood sugar 3 to 4 times daily. diag code E11.9. Insulin dependent  . lisinopril  (PRINIVIL,ZESTRIL) 5 MG tablet TAKE 1 TABLET BY MOUTH DAILY  . Multiple Vitamins-Minerals (CENTRUM SILVER ADULT 50+) TABS Take by mouth daily.  . pantoprazole (PROTONIX) 40 MG tablet Take 1 tablet (40 mg total) by mouth daily.  . tamsulosin (FLOMAX) 0.4 MG CAPS capsule Take 0.8 mg by mouth at bedtime.   No facility-administered encounter medications on file as of 03/13/2016.    Functional Status: In your present state of health, do you have any difficulty performing the following activities: 02/03/2016 01/27/2016  Hearing? [name]  Vision? N N  Difficulty concentrating or making decisions? N N  Walking or climbing stairs? Y Y  Dressing or bathing? N N  Doing errands, shopping? N Y    Fall/Depression Screening: PHQ 2/9 Scores 02/11/2016 02/03/2016 11/22/2015 05/07/2015 04/14/2015 04/06/2015 12/03/2014  PHQ - 2 Score 0 0 0 0 0 0 0    Assessment:  Drugs sorted by system:  Neurologic/Psychologic: none  Cardiovascular: aspirin, atorvastatin, carvedilol, furosemide, lisinopril  Pulmonary/Allergy: albuterol HFA  Gastrointestinal: pantoprazole  Endocrine: Lantus  Renal: tamsulosin  Topical: none  Pain: acetaminophen  Vitamins/Minerals: multivitamin  Infectious Diseases: none  Miscellaneous: none   Duplications in therapy: none noted Gaps in therapy: none noted - patient is on aspirin, ACE inhibitor, beta-blocker, and high intensity statin therapy Medications to avoid in the elderly: pantoprazole (risk of Clostridium difficile infection and bone loss and fractures) Drug interactions: none noted   Plan: 1.  I will send a letter to patient's PCP to  request pantoprazole be switched to a H2 blocker such as ranitidine, renally adjusted, if clinically appropriate.   2.  I will close pharmacy program as patient has withdrawn from program.  I will update [facility] CMRN Loni Muse.     Elisabeth Most, Pharm.D. Pharmacy Resident [place] [phone]

## 2016-03-15 ENCOUNTER — Other Ambulatory Visit: Payer: Self-pay | Admitting: Student in an Organized Health Care Education/Training Program

## 2016-03-21 ENCOUNTER — Ambulatory Visit: Payer: Self-pay

## 2016-03-23 ENCOUNTER — Emergency Department (HOSPITAL_COMMUNITY)
Admission: EM | Admit: 2016-03-23 | Discharge: 2016-03-23 | Disposition: A | Discharge status: Home / Self Care | Payer: Commercial Managed Care - HMO | Attending: Emergency Medicine | Admitting: Emergency Medicine

## 2016-03-23 ENCOUNTER — Emergency Department (HOSPITAL_COMMUNITY): Payer: Commercial Managed Care - HMO

## 2016-03-23 ENCOUNTER — Encounter (HOSPITAL_COMMUNITY): Payer: Self-pay | Admitting: Emergency Medicine

## 2016-03-23 DIAGNOSIS — I2510 Atherosclerotic heart disease of native coronary artery without angina pectoris: Secondary | ICD-10-CM | POA: Diagnosis not present

## 2016-03-23 DIAGNOSIS — E785 Hyperlipidemia, unspecified: Secondary | ICD-10-CM | POA: Diagnosis not present

## 2016-03-23 DIAGNOSIS — Z7982 Long term (current) use of aspirin: Secondary | ICD-10-CM | POA: Diagnosis not present

## 2016-03-23 DIAGNOSIS — R197 Diarrhea, unspecified: Secondary | ICD-10-CM | POA: Diagnosis not present

## 2016-03-23 DIAGNOSIS — Z79899 Other long term (current) drug therapy: Secondary | ICD-10-CM | POA: Insufficient documentation

## 2016-03-23 DIAGNOSIS — I130 Hypertensive heart and chronic kidney disease with heart failure and stage 1 through stage 4 chronic kidney disease, or unspecified chronic kidney disease: Secondary | ICD-10-CM | POA: Insufficient documentation

## 2016-03-23 DIAGNOSIS — E1122 Type 2 diabetes mellitus with diabetic chronic kidney disease: Secondary | ICD-10-CM | POA: Diagnosis not present

## 2016-03-23 DIAGNOSIS — K5900 Constipation, unspecified: Secondary | ICD-10-CM | POA: Diagnosis not present

## 2016-03-23 DIAGNOSIS — I252 Old myocardial infarction: Secondary | ICD-10-CM | POA: Diagnosis not present

## 2016-03-23 DIAGNOSIS — N189 Chronic kidney disease, unspecified: Secondary | ICD-10-CM | POA: Diagnosis not present

## 2016-03-23 DIAGNOSIS — Z87891 Personal history of nicotine dependence: Secondary | ICD-10-CM | POA: Diagnosis not present

## 2016-03-23 DIAGNOSIS — I5022 Chronic systolic (congestive) heart failure: Secondary | ICD-10-CM | POA: Diagnosis not present

## 2016-03-23 DIAGNOSIS — Z950 Presence of cardiac pacemaker: Secondary | ICD-10-CM | POA: Diagnosis not present

## 2016-03-23 LAB — CBC WITH DIFFERENTIAL/PLATELET
BASOS PCT: 0 %
Basophils Absolute: 0 10*3/uL (ref 0.0–0.1)
EOS ABS: 0.1 10*3/uL (ref 0.0–0.7)
Eosinophils Relative: 1 %
HEMATOCRIT: 38.8 % — AB (ref 39.0–52.0)
Hemoglobin: 12.5 g/dL — ABNORMAL LOW (ref 13.0–17.0)
Lymphocytes Relative: 24 %
Lymphs Abs: 2.5 10*3/uL (ref 0.7–4.0)
MCH: 29.3 pg (ref 26.0–34.0)
MCHC: 32.2 g/dL (ref 30.0–36.0)
MCV: 90.9 fL (ref 78.0–100.0)
MONO ABS: 1 10*3/uL (ref 0.1–1.0)
MONOS PCT: 9 %
Neutro Abs: 6.9 10*3/uL (ref 1.7–7.7)
Neutrophils Relative %: 66 %
Platelets: 212 10*3/uL (ref 150–400)
RBC: 4.27 MIL/uL (ref 4.22–5.81)
RDW: 14.5 % (ref 11.5–15.5)
WBC: 10.6 10*3/uL — ABNORMAL HIGH (ref 4.0–10.5)

## 2016-03-23 LAB — COMPREHENSIVE METABOLIC PANEL
ALBUMIN: 4 g/dL (ref 3.5–5.0)
ALK PHOS: 68 U/L (ref 38–126)
ALT: 19 U/L (ref 17–63)
AST: 23 U/L (ref 15–41)
Anion gap: 7 (ref 5–15)
BUN: 35 mg/dL — ABNORMAL HIGH (ref 6–20)
CALCIUM: 10 mg/dL (ref 8.9–10.3)
CO2: 32 mmol/L (ref 22–32)
CREATININE: 1.87 mg/dL — AB (ref 0.61–1.24)
Chloride: 96 mmol/L — ABNORMAL LOW (ref 101–111)
GFR calc Af Amer: 37 mL/min — ABNORMAL LOW (ref >60)
GFR calc non Af Amer: 32 mL/min — ABNORMAL LOW (ref >60)
GLUCOSE: 347 mg/dL — AB (ref 65–99)
Potassium: 4.7 mmol/L (ref 3.5–5.1)
SODIUM: 135 mmol/L (ref 135–145)
Total Bilirubin: 0.7 mg/dL (ref 0.3–1.2)
Total Protein: 7.9 g/dL (ref 6.5–8.1)

## 2016-03-23 NOTE — ED Notes (Addendum)
Per PTAR. Pt reports constipation for the past 2 days. Took milk of magnesia last night which caused 3 liquid BM and resolved constipation. Pt still wanted to come in today to be checked out. Only complaint with PTAR was rectal pain. Pt complain in triage that he has had ongoing diarrhea.

## 2016-03-23 NOTE — Discharge Instructions (Signed)

## 2016-03-23 NOTE — ED Provider Notes (Signed)
CSN: [id number]     Arrival date & time 03/23/16  1111 History   First MD Initiated Contact with Patient 03/23/16 1333     Chief Complaint  Patient presents with  . Constipation     Patient is a 82 y.o. male presenting with constipation. The history is provided by the patient. No language interpreter was used.  Constipation  JAHRON HUNSINGER is a 82 y.o. male who presents to the Emergency Department complaining of Constipation. He reports feeling constipated for the last 2 days. He took milk of magnesia last night and developed severe diarrhea. She reports multiple liquid stools and he has had bowel incontinence. He denies any chest pain or shortness of breath, fevers, nausea, vomiting, change in urinary output. No recent antibiotic use.  Past Medical History  Diagnosis Date  . Cataracts, bilateral   . Chronic systolic CHF (congestive heart failure) ([place])     a. ischemic CM EF 15-20%;  b. s/p AICD 05/24/04;  c. Echo 7/06: EF 30-40%, mild reduced RVSF, d. Echo 12/2015 EF 35-40%  . CAD (coronary artery disease)     a. s/p AMI, s/p PTCA & stent of cx 12/04;  b. LHC (5/05): Proximal LAD 100% with bridging collaterals (CTO), proximal circumflex 20%, mid circumflex 95% ISR, proximal-mid RCA 75%, EF 20% >>PCI: 3.0 x 28 mm Cypher DES to the mid CFX  . Diverticulosis of colon   . Hyperlipidemia   . PVD (peripheral vascular disease) ([place])     s/p L carotid PTCA/stent 2004  . Transient ischemic attack   . Erectile dysfunction   . Hyperkalemia 08/2008    K=5.7   . BBB (bundle branch block)     right  . HTN (hypertension)   . Elevated PSA   . Adenomatous colon polyp 02/14/2012  . ICD (implantable cardiac defibrillator) in place 12-25-2012    MDT CRTD upgrade by Dr Lovena Le  . Pacemaker   . Type II diabetes mellitus ([place])   . Myocardial infarction ([place]) 1990  . Arthritis     "right hip" (12/25/2012)  . Carotid stenosis     a. s/p L carotid stent 2004;  b. Carotid US (09/2014): Bilateral ICA 1-39%,  left ECA >59%, normal subclavian bilaterally, occluded left vertebral >> FU 2 years  . Pericardial effusion     Echocardiogram (09/2014): EF 25% with distal anterior, distal inferior, distal lateral and apical akinesis, grade 1 diastolic dysfunction, very mild aortic stenosis (mean 7 mmHg) - this may be depressed due to low EF (2-D images suggest mild to moderate aortic stenosis), large pericardial effusion, no RA collapse  . Presence of permanent cardiac pacemaker   . AICD (automatic cardioverter/defibrillator) present   . Myocardial infarction ([place])   . Coronary artery disease   . Hypertension   . Dysrhythmia   . Peripheral vascular disease ([place])   . CHF (congestive heart failure) ([place])   . Diabetes mellitus without complication ([place])   . Chronic kidney disease   . Anemia    Past Surgical History  Procedure Laterality Date  . Cardiac defibrillator placement  05/24/2004    Implantation of a MDT single-chamber defibrillator  . Carotid stent  09/11/2003    Percutaneous transluminal angioplasty and stent placement of the left internal carotid artery.  . Biv icd upgrade  12/25/2012    MDT CRTD upgrade by Dr Lovena Le for ischemic cardiomyopathy and worsening conduction system disease  . Cataract extraction w/ intraocular lens  implant, bilateral Bilateral ~ 2010  .  Cardiac catheterization  06/2003,  01/2004  . Coronary angioplasty with stent placement  1990    "2" (12/25/2012)  . Bi-ventricular implantable cardioverter defibrillator upgrade N/A 12/25/2012    Procedure: BI-VENTRICULAR IMPLANTABLE CARDIOVERTER DEFIBRILLATOR UPGRADE;  Surgeon: Evans Lance, MD;  Location: [facility] CATH LAB;  Service: Cardiovascular;  Laterality: N/A;  . Lead revision N/A 12/25/2012    Procedure: LEAD REVISION;  Surgeon: Evans Lance, MD;  Location: [facility] CATH LAB;  Service: Cardiovascular;  Laterality: N/A;  . Insert / replace / remove pacemaker    . Cardiac catheterization N/A 01/13/2016    Procedure: Left Heart Cath and  Coronary Angiography;  Surgeon: Jettie Booze, MD;  Location: [place] CV LAB;  Service: Cardiovascular;  Laterality: N/A;   Family History  Problem Relation Age of Onset  . Diabetes Mother   . Diabetes Brother   . Heart attack Neg Hx   . Stroke Neg Hx    Social History  Substance Use Topics  . Smoking status: Former Smoker    Types: Cigarettes    Quit date: 12/27/1967  . Smokeless tobacco: Never Used  . Alcohol Use: No     Comment: 12/25/2012 "quit all alcohol 60 yr ago"    Review of Systems  Gastrointestinal: Positive for constipation.  All other systems reviewed and are negative.     Allergies  Review of patient's allergies indicates no known allergies.  Home Medications   Prior to Admission medications   Medication Sig Start Date End Date Taking? Authorizing Provider  ACCU-CHEK AVIVA PLUS test strip USE TO CHECK BLOOD SUGARS THREE TIMES DAILY TO FOUR TIMES DAILY 11/24/15  Yes Axel Filler, MD  acetaminophen (TYLENOL) 500 MG tablet Take 500 mg by mouth 2 (two) times daily.    Yes Historical Provider, MD  aspirin EC 81 MG tablet Take 81 mg by mouth daily.   Yes Historical Provider, MD  B-D ULTRAFINE III SHORT PEN 31G X 8 MM MISC USE AS DIRECTED ONCE DAILY FOR INSULIN INJECTION 02/08/16  Yes Oval Linsey, MD  Blood Glucose Monitoring Suppl (ACCU-CHEK AVIVA PLUS) w/Device KIT Please use 3 to 4 times daily to check blood glucose. diag code E11.9. Insulin dependent 11/23/15  Yes Axel Filler, MD  carvedilol (COREG) 12.5 MG tablet Take 0.5 tablets (6.25 mg total) by mouth 2 (two) times daily. 02/11/16  Yes Evans Lance, MD  furosemide (LASIX) 40 MG tablet TAKE 1 TABLET BY MOUTH TWICE DAILY 02/09/16  Yes Peter M Martinique, MD  Insulin Glargine (LANTUS SOLOSTAR) 100 UNIT/ML Solostar Pen Inject 15 Units into the skin at bedtime. 01/28/16  Yes Norval Gable, MD  Lancets (ACCU-CHEK MULTICLIX) lancets USE THREE TIMES DAILY TO FOUR TIMES DAILY TO CHECK BLOOD SUGAR  11/24/15  Yes Axel Filler, MD  Lancets Misc. (ACCU-CHEK FASTCLIX LANCET) KIT Please use to check blood sugar 3 to 4 times daily. diag code E11.9. Insulin dependent 11/23/15  Yes Axel Filler, MD  lisinopril (PRINIVIL,ZESTRIL) 5 MG tablet TAKE 1 TABLET BY MOUTH DAILY 02/22/16  Yes Axel Filler, MD  Multiple Vitamins-Minerals (CENTRUM SILVER ADULT 50+) TABS Take 1 tablet by mouth daily.    Yes Historical Provider, MD  pantoprazole (PROTONIX) 40 MG tablet Take 1 tablet (40 mg total) by mouth daily. 01/17/16  Yes Oval Linsey, MD  simvastatin (ZOCOR) 40 MG tablet Take 40 mg by mouth daily. 03/20/16  Yes Historical Provider, MD  tamsulosin (FLOMAX) 0.4 MG CAPS capsule Take 0.8 mg by  mouth at bedtime.   Yes Historical Provider, MD  VENTOLIN HFA 108 (90 Base) MCG/ACT inhaler INHALE 2 PUFFS INTO THE LUNGS EVERY 6 HOURS AS NEEDED FOR WHEEZING OR SHORTNESS OF BREATH 03/15/16  Yes Axel Filler, MD  atorvastatin (LIPITOR) 40 MG tablet Take 1 tablet (40 mg total) by mouth daily at 6 PM. Patient not taking: Reported on 03/23/2016 01/14/16   Shela Leff, MD   BP 133/65 mmHg  Pulse 66  Temp(Src) 98 F (36.7 C) (Oral)  Resp 18  Ht 5' 10" (1.778 m)  Wt 170 lb (77.111 kg)  BMI 24.39 kg/m2  SpO2 100% Physical Exam  Constitutional: He is oriented to person, place, and time. He appears well-developed and well-nourished.  HENT:  Head: Normocephalic and atraumatic.  Cardiovascular: Normal rate and regular rhythm.   No murmur heard. Pulmonary/Chest: Effort normal and breath sounds normal. No respiratory distress.  Abdominal: Soft. There is no tenderness. There is no rebound and no guarding.  Genitourinary:  Nontender rectal examination. There is a small fecal impaction that is ballotable but cannot be reached on examination. Brown stool.  Musculoskeletal: He exhibits no edema or tenderness.  Neurological: He is alert and oriented to person, place, and time.  Skin: Skin is  warm and dry.  Psychiatric: He has a normal mood and affect. His behavior is normal.  Nursing note and vitals reviewed.   ED Course  Procedures (including critical care time) Labs Review Labs Reviewed  COMPREHENSIVE METABOLIC PANEL - Abnormal; Notable for the following:    Chloride 96 (*)    Glucose, Bld 347 (*)    BUN 35 (*)    Creatinine, Ser 1.87 (*)    GFR calc non Af Amer 32 (*)    GFR calc Af Amer 37 (*)    All other components within normal limits  CBC WITH DIFFERENTIAL/PLATELET - Abnormal; Notable for the following:    WBC 10.6 (*)    Hemoglobin 12.5 (*)    HCT 38.8 (*)    All other components within normal limits    Imaging Review Dg Abd 1 View  03/23/2016  CLINICAL DATA:  Diarrhea, had constipation for 2 days and took milk of magnesia which caused 3 liquid bowel movements, history CHF, coronary artery disease post MI, type II diabetes mellitus, former smoker EXAM: ABDOMEN - 1 VIEW COMPARISON:  CT abdomen and pelvis 11/17/2015 FINDINGS: Slightly increased stool in rectum. Bowel gas pattern otherwise normal. No bowel dilatation or bowel wall thickening. Multilevel degenerative disc disease changes of the lumbar spine. Pacemaker leads project over RIGHT ventricle. IMPRESSION: Slightly increased stool in rectum; otherwise normal bowel gas pattern. Electronically Signed   By: Lavonia Dana M.D.   On: 03/23/2016 14:50   I have personally reviewed and evaluated these images and lab results as part of my medical decision-making.   EKG Interpretation None      MDM   Final diagnoses:  Constipation, unspecified constipation type    Patient here for evaluation of constipation followed by liquid stools. He has a fecal impaction in the emergency Department. Unable to disimpact manually but following enema patient has good relief of symptoms and passed a large stool ball. Plan to DC home with continued bowel care. Labs similar compared to priors. Outpatient follow-up and return  precautions discussed.    Quintella Reichert, MD 03/24/16 1239

## 2016-04-03 ENCOUNTER — Other Ambulatory Visit: Payer: Self-pay

## 2016-04-03 NOTE — Patient Outreach (Signed)
Unsuccessful attempt made to contact patient for community care coordination needs. Will discharge patient from my caseload due to inability to maintain contact.    Discharge letters sent to patient and primary care physician

## 2016-04-12 DIAGNOSIS — H16143 Punctate keratitis, bilateral: Secondary | ICD-10-CM | POA: Diagnosis not present

## 2016-04-12 DIAGNOSIS — D210 Benign neoplasm of connective and other soft tissue of head, face and neck: Secondary | ICD-10-CM | POA: Diagnosis not present

## 2016-04-12 DIAGNOSIS — Z961 Presence of intraocular lens: Secondary | ICD-10-CM | POA: Diagnosis not present

## 2016-04-12 DIAGNOSIS — D3102 Benign neoplasm of left conjunctiva: Secondary | ICD-10-CM | POA: Diagnosis not present

## 2016-04-14 ENCOUNTER — Other Ambulatory Visit: Payer: Self-pay | Admitting: Internal Medicine

## 2016-04-17 ENCOUNTER — Emergency Department (HOSPITAL_COMMUNITY)
Admission: EM | Admit: 2016-04-17 | Discharge: 2016-04-17 | Disposition: A | Discharge status: Home / Self Care | Payer: Commercial Managed Care - HMO | Attending: Emergency Medicine | Admitting: Emergency Medicine

## 2016-04-17 ENCOUNTER — Encounter (HOSPITAL_COMMUNITY): Payer: Self-pay | Admitting: Emergency Medicine

## 2016-04-17 ENCOUNTER — Emergency Department (HOSPITAL_COMMUNITY): Payer: Commercial Managed Care - HMO

## 2016-04-17 DIAGNOSIS — Z794 Long term (current) use of insulin: Secondary | ICD-10-CM | POA: Insufficient documentation

## 2016-04-17 DIAGNOSIS — K5900 Constipation, unspecified: Secondary | ICD-10-CM | POA: Diagnosis not present

## 2016-04-17 DIAGNOSIS — I252 Old myocardial infarction: Secondary | ICD-10-CM | POA: Diagnosis not present

## 2016-04-17 DIAGNOSIS — N183 Chronic kidney disease, stage 3 (moderate): Secondary | ICD-10-CM | POA: Diagnosis not present

## 2016-04-17 DIAGNOSIS — Z950 Presence of cardiac pacemaker: Secondary | ICD-10-CM | POA: Insufficient documentation

## 2016-04-17 DIAGNOSIS — E1122 Type 2 diabetes mellitus with diabetic chronic kidney disease: Secondary | ICD-10-CM | POA: Diagnosis not present

## 2016-04-17 DIAGNOSIS — K5909 Other constipation: Secondary | ICD-10-CM | POA: Diagnosis not present

## 2016-04-17 DIAGNOSIS — Z955 Presence of coronary angioplasty implant and graft: Secondary | ICD-10-CM | POA: Insufficient documentation

## 2016-04-17 DIAGNOSIS — Z87891 Personal history of nicotine dependence: Secondary | ICD-10-CM | POA: Diagnosis not present

## 2016-04-17 DIAGNOSIS — I130 Hypertensive heart and chronic kidney disease with heart failure and stage 1 through stage 4 chronic kidney disease, or unspecified chronic kidney disease: Secondary | ICD-10-CM | POA: Insufficient documentation

## 2016-04-17 DIAGNOSIS — Z7982 Long term (current) use of aspirin: Secondary | ICD-10-CM | POA: Insufficient documentation

## 2016-04-17 DIAGNOSIS — I2510 Atherosclerotic heart disease of native coronary artery without angina pectoris: Secondary | ICD-10-CM | POA: Insufficient documentation

## 2016-04-17 DIAGNOSIS — Z79899 Other long term (current) drug therapy: Secondary | ICD-10-CM | POA: Diagnosis not present

## 2016-04-17 DIAGNOSIS — I5022 Chronic systolic (congestive) heart failure: Secondary | ICD-10-CM | POA: Diagnosis not present

## 2016-04-17 LAB — COMPREHENSIVE METABOLIC PANEL
ALBUMIN: 3.8 g/dL (ref 3.5–5.0)
ALT: 11 U/L — ABNORMAL LOW (ref 17–63)
ANION GAP: 9 (ref 5–15)
AST: 17 U/L (ref 15–41)
Alkaline Phosphatase: 55 U/L (ref 38–126)
BUN: 37 mg/dL — AB (ref 6–20)
CHLORIDE: 98 mmol/L — AB (ref 101–111)
CO2: 28 mmol/L (ref 22–32)
Calcium: 10.3 mg/dL (ref 8.9–10.3)
Creatinine, Ser: 2.08 mg/dL — ABNORMAL HIGH (ref 0.61–1.24)
GFR calc Af Amer: 32 mL/min — ABNORMAL LOW (ref >60)
GFR, EST NON AFRICAN AMERICAN: 28 mL/min — AB (ref >60)
Glucose, Bld: 325 mg/dL — ABNORMAL HIGH (ref 65–99)
POTASSIUM: 4.1 mmol/L (ref 3.5–5.1)
Sodium: 135 mmol/L (ref 135–145)
TOTAL PROTEIN: 7.4 g/dL (ref 6.5–8.1)
Total Bilirubin: 1 mg/dL (ref 0.3–1.2)

## 2016-04-17 LAB — CBC WITH DIFFERENTIAL/PLATELET
BASOS ABS: 0.1 10*3/uL (ref 0.0–0.1)
Basophils Relative: 1 %
EOS PCT: 2 %
Eosinophils Absolute: 0.2 10*3/uL (ref 0.0–0.7)
HEMATOCRIT: 40.2 % (ref 39.0–52.0)
HEMOGLOBIN: 13.4 g/dL (ref 13.0–17.0)
LYMPHS ABS: 2.7 10*3/uL (ref 0.7–4.0)
LYMPHS PCT: 35 %
MCH: 30.9 pg (ref 26.0–34.0)
MCHC: 33.3 g/dL (ref 30.0–36.0)
MCV: 92.6 fL (ref 78.0–100.0)
Monocytes Absolute: 0.9 10*3/uL (ref 0.1–1.0)
Monocytes Relative: 12 %
NEUTROS ABS: 3.9 10*3/uL (ref 1.7–7.7)
NEUTROS PCT: 50 %
Platelets: 141 10*3/uL — ABNORMAL LOW (ref 150–400)
RBC: 4.34 MIL/uL (ref 4.22–5.81)
RDW: 13.8 % (ref 11.5–15.5)
WBC: 7.7 10*3/uL (ref 4.0–10.5)

## 2016-04-17 LAB — I-STAT CG4 LACTIC ACID, ED: Lactic Acid, Venous: 1.14 mmol/L (ref 0.5–1.9)

## 2016-04-17 LAB — LIPASE, BLOOD: Lipase: 27 U/L (ref 11–51)

## 2016-04-17 MED ORDER — DOCUSATE SODIUM 100 MG PO CAPS: 100.0000 mg | ORAL_CAPSULE | Freq: Two times a day (BID) | ORAL | 0 refills | Status: DC

## 2016-04-17 MED ORDER — SODIUM CHLORIDE 0.9 % IV BOLUS (SEPSIS): 500.0000 mL | Freq: Once | INTRAVENOUS | Status: DC

## 2016-04-17 MED ORDER — POLYETHYLENE GLYCOL 3350 17 G PO PACK: 17.0000 g | PACK | Freq: Every day | ORAL | 0 refills | Status: DC | PRN

## 2016-04-17 NOTE — Discharge Instructions (Signed)
You were seen in the emergency department for constipation. I recommend starting Colace twice daily to prevent constipation. You should take MiraLAX once a day for the next several days until he started to have loose bowel movements, then stop. It is important to drink plenty of water while taking MiraLAX. Follow up with your doctor.

## 2016-04-17 NOTE — ED Notes (Signed)
Pt in the restroom after feeling like he needed to have a BM.

## 2016-04-17 NOTE — ED Triage Notes (Signed)
C/o constipation -- last BM last week. Seen at [facility] 3 weeks ago for same- states was given an enema with good results. Has not tried any OTC meds today.

## 2016-04-17 NOTE — ED Provider Notes (Signed)
[facility] DEPT Provider Note   CSN: [id number] Arrival date & time: 04/17/16  9937  First Provider Contact:  First MD Initiated Contact with Patient 04/17/16 [phone]        History   Chief Complaint Chief Complaint  Patient presents with  . Constipation    HPI [name] is a 82 y.o. male.  The history is provided by the patient and medical records.  Abdominal Pain   This is a new problem. The current episode started more than 1 week ago. The problem occurs constantly. The problem has not changed since onset.Associated with: constipation. The pain is located in the rectum. The pain is at a severity of 4/10. The pain is moderate. Associated symptoms include constipation. Pertinent negatives include anorexia, fever, diarrhea, nausea, vomiting, dysuria and headaches. Nothing aggravates the symptoms. The symptoms are relieved by bowel activity. Past workup does not include GI consult. His past medical history does not include irritable bowel syndrome.    Past Medical History:  Diagnosis Date  . Adenomatous colon polyp 02/14/2012  . AICD (automatic cardioverter/defibrillator) present   . Anemia   . Arthritis    "right hip" (12/25/2012)  . BBB (bundle branch block)    right  . CAD (coronary artery disease)    a. s/p AMI, s/p PTCA & stent of cx 12/04;  b. LHC (5/05): Proximal LAD 100% with bridging collaterals (CTO), proximal circumflex 20%, mid circumflex 95% ISR, proximal-mid RCA 75%, EF 20% >>PCI: 3.0 x 28 mm Cypher DES to the mid CFX  . Carotid stenosis    a. s/p L carotid stent 2004;  b. Carotid US (09/2014): Bilateral ICA 1-39%, left ECA >59%, normal subclavian bilaterally, occluded left vertebral >> FU 2 years  . Cataracts, bilateral   . CHF (congestive heart failure) ([place])   . Chronic kidney disease   . Chronic systolic CHF (congestive heart failure) (H[facility])    a. ischemic CM EF 15-20%;  b. s/p AICD 05/24/04;  c. Echo 7/06: EF 30-40%, mild reduced RVSF, d. Echo 12/2015 EF  35-40%  . Coronary artery disease   . Diabetes mellitus without complication ([place])   . Diverticulosis of colon   . Dysrhythmia   . Elevated PSA   . Erectile dysfunction   . HTN (hypertension)   . Hyperkalemia 08/2008   K=5.7   . Hyperlipidemia   . Hypertension   . ICD (implantable cardiac defibrillator) in place 12-25-2012   MDT CRTD upgrade by Dr Lovena Le  . Myocardial infarction ([place]) 1990  . Myocardial infarction ([place])   . Pacemaker   . Pericardial effusion    Echocardiogram (09/2014): EF 25% with distal anterior, distal inferior, distal lateral and apical akinesis, grade 1 diastolic dysfunction, very mild aortic stenosis (mean 7 mmHg) - this may be depressed due to low EF (2-D images suggest mild to moderate aortic stenosis), large pericardial effusion, no RA collapse  . Peripheral vascular disease ([place])   . Presence of permanent cardiac pacemaker   . PVD (peripheral vascular disease) ([place])    s/p L carotid PTCA/stent 2004  . Transient ischemic attack   . Type II diabetes mellitus [facility])     Patient Active Problem List   Diagnosis Date Noted  . Tinnitus 02/03/2016  . Protein-calorie malnutrition, severe 01/28/2016  . Hypoglycemia 01/27/2016  . Urinary retention 01/13/2016  . Healthcare maintenance 11/22/2015  . Aortic stenosis 11/11/2015  . Numbness on left side 04/14/2015  . [name] (chronic kidney disease) stage 3, GFR 30-59 ml/min  12/04/2014  . Pericardial effusion 11/27/2014  . HTN (hypertension) 11/02/2014  . Seasonal allergic rhinitis 02/14/2012  . Adenomatous colon polyp 02/14/2012  . Automatic implantable cardioverter-defibrillator in situ 01/16/2012  . PSA elevation 06/22/2011  . LOW BACK PAIN SYNDROME 05/12/2009  . Coronary atherosclerosis 12/25/2007  . Ischemic Cardiomyopathy 12/25/2007  . Chronic systolic congestive heart failure ([place]) 12/25/2007  . Hyperlipidemia 01/02/2007  . Peripheral vascular disease ([place]) 01/02/2007  . Carotid stenosis 09/11/2003  . Type  2 diabetes mellitus with peripheral vascular disease ([place]) 01/01/1989    Past Surgical History:  Procedure Laterality Date  . BI-VENTRICULAR IMPLANTABLE CARDIOVERTER DEFIBRILLATOR UPGRADE N/A 12/25/2012   Procedure: BI-VENTRICULAR IMPLANTABLE CARDIOVERTER DEFIBRILLATOR UPGRADE;  Surgeon: Evans Lance, MD;  Location: [facility] CATH LAB;  Service: Cardiovascular;  Laterality: N/A;  . BIV ICD UPGRADE  12/25/2012   MDT CRTD upgrade by Dr Lovena Le for ischemic cardiomyopathy and worsening conduction system disease  . CARDIAC CATHETERIZATION  06/2003,  01/2004  . CARDIAC CATHETERIZATION N/A 01/13/2016   Procedure: Left Heart Cath and Coronary Angiography;  Surgeon: Jettie Booze, MD;  Location: [facility] CV LAB;  Service: Cardiovascular;  Laterality: N/A;  . CARDIAC DEFIBRILLATOR PLACEMENT  05/24/2004   Implantation of a MDT single-chamber defibrillator  . CAROTID STENT  09/11/2003   Percutaneous transluminal angioplasty and stent placement of the left internal carotid artery.  Marland Kitchen CATARACT EXTRACTION W/ INTRAOCULAR LENS  IMPLANT, BILATERAL Bilateral ~ 2010  . CORONARY ANGIOPLASTY WITH STENT PLACEMENT  1990   "2" (12/25/2012)  . INSERT / REPLACE / REMOVE PACEMAKER    . LEAD REVISION N/A 12/25/2012   Procedure: LEAD REVISION;  Surgeon: Evans Lance, MD;  Location: [facility] CATH LAB;  Service: Cardiovascular;  Laterality: N/A;       Home Medications    Prior to Admission medications   Medication Sig Start Date End Date Taking? Authorizing Provider  ACCU-CHEK AVIVA PLUS test strip USE TO CHECK BLOOD SUGARS THREE TIMES DAILY TO FOUR TIMES DAILY 11/24/15   Axel Filler, MD  acetaminophen (TYLENOL) 500 MG tablet Take 500 mg by mouth 2 (two) times daily.     Historical Provider, MD  aspirin EC 81 MG tablet Take 81 mg by mouth daily.    Historical Provider, MD  atorvastatin (LIPITOR) 40 MG tablet TAKE 1 TABLET(40 MG) BY MOUTH DAILY AT 6 PM 04/14/16   Axel Filler, MD  B-D ULTRAFINE III SHORT PEN  31G X 8 MM MISC USE AS DIRECTED ONCE DAILY FOR INSULIN INJECTION 02/08/16   Oval Linsey, MD  Blood Glucose Monitoring Suppl (ACCU-CHEK AVIVA PLUS) w/Device KIT Please use 3 to 4 times daily to check blood glucose. diag code E11.9. Insulin dependent 11/23/15   Axel Filler, MD  carvedilol (COREG) 12.5 MG tablet Take 0.5 tablets (6.25 mg total) by mouth 2 (two) times daily. 02/11/16   Evans Lance, MD  docusate sodium (COLACE) 100 MG capsule Take 1 capsule (100 mg total) by mouth every 12 (twelve) hours. 04/17/16   Duffy Bruce, MD  furosemide (LASIX) 40 MG tablet TAKE 1 TABLET BY MOUTH TWICE DAILY 02/09/16   Peter M Martinique, MD  Insulin Glargine (LANTUS SOLOSTAR) 100 UNIT/ML Solostar Pen Inject 15 Units into the skin at bedtime. 01/28/16   Norval Gable, MD  Lancets (ACCU-CHEK MULTICLIX) lancets USE THREE TIMES DAILY TO FOUR TIMES DAILY TO CHECK BLOOD SUGAR 11/24/15   Axel Filler, MD  Lancets Misc. (ACCU-CHEK FASTCLIX LANCET) KIT Please use to check  blood sugar 3 to 4 times daily. diag code E11.9. Insulin dependent 11/23/15   Axel Filler, MD  lisinopril (PRINIVIL,ZESTRIL) 5 MG tablet TAKE 1 TABLET BY MOUTH DAILY 02/22/16   Axel Filler, MD  Multiple Vitamins-Minerals (CENTRUM SILVER ADULT 50+) TABS Take 1 tablet by mouth daily.     Historical Provider, MD  pantoprazole (PROTONIX) 40 MG tablet Take 1 tablet (40 mg total) by mouth daily. 01/17/16   Oval Linsey, MD  polyethylene glycol [facility] Floria Raveling) packet Take 17 g by mouth daily as needed for severe constipation. 04/17/16   Duffy Bruce, MD  simvastatin (ZOCOR) 40 MG tablet Take 40 mg by mouth daily. 03/20/16   Historical Provider, MD  tamsulosin (FLOMAX) 0.4 MG CAPS capsule Take 0.8 mg by mouth at bedtime.    Historical Provider, MD  VENTOLIN HFA 108 (90 Base) MCG/ACT inhaler INHALE 2 PUFFS INTO THE LUNGS EVERY 6 HOURS AS NEEDED FOR WHEEZING OR SHORTNESS OF BREATH 03/15/16   Axel Filler, MD     Family History Family History  Problem Relation Age of Onset  . Diabetes Mother   . Diabetes Brother   . Heart attack Neg Hx   . Stroke Neg Hx     Social History Social History  Substance Use Topics  . Smoking status: Former Smoker    Types: Cigarettes    Quit date: 12/27/1967  . Smokeless tobacco: Never Used  . Alcohol use No     Comment: 12/25/2012 "quit all alcohol 60 yr ago"     Allergies   Review of patient's allergies indicates no known allergies.   Review of Systems Review of Systems  Constitutional: Negative for chills, fatigue and fever.  HENT: Negative for congestion and rhinorrhea.   Eyes: Negative for visual disturbance.  Respiratory: Negative for cough and shortness of breath.   Cardiovascular: Negative for chest pain.  Gastrointestinal: Positive for abdominal pain and constipation. Negative for anorexia, diarrhea, nausea, rectal pain and vomiting.  Genitourinary: Negative for dysuria and flank pain.  Musculoskeletal: Negative for neck pain.  Skin: Positive for wound. Negative for rash.  Neurological: Negative for syncope, weakness and headaches.     Physical Exam Updated Vital Signs BP 157/63 (BP Location: Right Arm)   Pulse 60   Temp 98.1 F (36.7 C) (Oral)   Resp 20   Ht '5\' 10"'$  (1.778 m)   Wt 170 lb (77.1 kg)   SpO2 100%   BMI 24.39 kg/m   Physical Exam  Constitutional: He appears well-developed and well-nourished. No distress.  HENT:  Head: Normocephalic.  Mouth/Throat: Oropharynx is clear and moist. No oropharyngeal exudate.  Eyes: Conjunctivae are normal. Pupils are equal, round, and reactive to light.  Neck: Normal range of motion. Neck supple.  Cardiovascular: Normal rate, regular rhythm and normal heart sounds.  Exam reveals no friction rub.   No murmur heard. Pulmonary/Chest: Effort normal and breath sounds normal. No respiratory distress. He has no wheezes. He has no rales.  Abdominal: Soft. Bowel sounds are normal. He exhibits  no distension. There is tenderness (mild, LLQ). There is no rebound and no guarding.  Genitourinary:  Genitourinary Comments: Normal rectal tone. No blood per rectum. Superficial, stage I/II decubitus ulcer noted on sacrum. No surrounding erythema or drainage. No fistulae.  Musculoskeletal: He exhibits no edema.  Neurological: He is alert. He exhibits normal muscle tone.  Nursing note and vitals reviewed.    ED Treatments / Results  Labs (all labs ordered are listed, but  only abnormal results are displayed) Labs Reviewed  COMPREHENSIVE METABOLIC PANEL - Abnormal; Notable for the following:       Result Value   Chloride 98 (*)    Glucose, Bld 325 (*)    BUN 37 (*)    Creatinine, Ser 2.08 (*)    ALT 11 (*)    GFR calc non Af Amer 28 (*)    GFR calc Af Amer 32 (*)    All other components within normal limits  CBC WITH DIFFERENTIAL/PLATELET - Abnormal; Notable for the following:    Platelets 141 (*)    All other components within normal limits  LIPASE, BLOOD  I-STAT CG4 LACTIC ACID, ED    EKG  EKG Interpretation None       Radiology Ct Abdomen Pelvis Wo Contrast  Result Date: 04/17/2016 CLINICAL DATA:  Patient with constipation for multiple weeks. EXAM: CT ABDOMEN AND PELVIS WITHOUT CONTRAST TECHNIQUE: Multidetector CT imaging of the abdomen and pelvis was performed following the standard protocol without IV contrast. COMPARISON:  Abdomen pelvic CT 11/17/2015. FINDINGS: Lower chest: Normal heart size. Small pericardial effusion. Dependent atelectasis within the left lower lobe. No pleural effusion. Hepatobiliary: The liver is normal in size and contour. Gallbladder is unremarkable. Pancreas: Unremarkable Spleen: Unremarkable Adrenals/Urinary Tract: Normal adrenal glands. Unchanged 3.7 cm cyst within the interpolar region of the left kidney. Unchanged 1.8 cm cyst within the superior pole of the left kidney. Urinary bladder is unremarkable. Stomach/Bowel: Large amount of stool  within the sigmoid colon and rectum. A normal morphology of stomach. No evidence for bowel obstruction. No free fluid or free intraperitoneal air. Vascular/Lymphatic: Aortic vascular calcifications. No retroperitoneal lymphadenopathy. Other: Prostate is enlarged.  Fat containing right inguinal hernia. Musculoskeletal: Lumbar spine degenerative changes. No aggressive or acute appearing osseous lesions. IMPRESSION: No acute process within the abdomen or pelvis. Large amount of stool within the sigmoid colon and rectum as can be seen with constipation. Aortic vascular calcifications. Left basilar atelectasis and or scarring. Electronically Signed   By: Lovey Newcomer M.D.   On: 04/17/2016 12:52   Procedures Procedures (including critical care time)  Medications Ordered in ED Medications - No data to display   Initial Impression / Assessment and Plan / ED Course  I have reviewed the triage vital signs and the nursing notes.  Pertinent labs & imaging results that were available during my care of the patient were reviewed by me and considered in my medical decision making (see chart for details).  Clinical Course    82 yo M with PMHx of chronic constipation, HTN, HLD, CAD, DM2, who p/w acute on chronic constipation. Seen several weeks ago for similar sx, resolved with enema. On arrival, VSS and WNL. Exam overall re-assuring. Labs obtained and are similar to baseline. CT scan obtained given recurrent constipation, abdominal pain and age - reviewed, shows no acute abnormality beyond constipation. Enema subsequently given with +BM and resolution of sx. Given reassuring labs, CT scan, and resolution of sx with BM, will d/c home. Will start on colace and miralax PRN. Pt in agreement. Return precautions given.  Final Clinical Impressions(s) / ED Diagnoses   Final diagnoses:  Other constipation    New Prescriptions Discharge Medication List as of 04/17/2016  3:09 PM    START taking these medications    Details  docusate sodium (COLACE) 100 MG capsule Take 1 capsule (100 mg total) by mouth every 12 (twelve) hours., Starting Mon 04/17/2016, Print    polyethylene glycol (MIRALAX /  GLYCOLAX) packet Take 17 g by mouth daily as needed for severe constipation., Starting Mon 04/17/2016, Print         Duffy Bruce, MD 04/17/16 2130

## 2016-04-17 NOTE — ED Notes (Signed)
Placed patient in a GOWN AND ON THE MONITOR

## 2016-04-17 NOTE — ED Notes (Signed)
Patient transported to CT 

## 2016-04-21 ENCOUNTER — Other Ambulatory Visit: Payer: Self-pay | Admitting: *Deleted

## 2016-04-21 ENCOUNTER — Other Ambulatory Visit: Payer: Self-pay | Admitting: Internal Medicine

## 2016-04-21 ENCOUNTER — Encounter (INDEPENDENT_AMBULATORY_CARE_PROVIDER_SITE_OTHER): Payer: Commercial Managed Care - HMO | Admitting: Internal Medicine

## 2016-04-21 MED ORDER — PANTOPRAZOLE SODIUM 40 MG PO TBEC
40.0000 mg | DELAYED_RELEASE_TABLET | Freq: Every day | ORAL | 0 refills | Status: DC
Start: 2016-04-21 — End: 2016-07-17

## 2016-04-21 NOTE — Telephone Encounter (Signed)
Has appt with pcp on 05/21/16.[name]7/28/20179:43 AM

## 2016-04-25 ENCOUNTER — Encounter: Payer: Self-pay | Admitting: Internal Medicine

## 2016-05-04 ENCOUNTER — Encounter (HOSPITAL_COMMUNITY): Payer: Self-pay | Admitting: Emergency Medicine

## 2016-05-04 ENCOUNTER — Ambulatory Visit (HOSPITAL_COMMUNITY)
Admission: EM | Admit: 2016-05-04 | Discharge: 2016-05-04 | Disposition: A | Discharge status: Home / Self Care | Payer: Commercial Managed Care - HMO | Attending: Emergency Medicine | Admitting: Emergency Medicine

## 2016-05-04 DIAGNOSIS — K5900 Constipation, unspecified: Secondary | ICD-10-CM | POA: Diagnosis not present

## 2016-05-04 MED ORDER — POLYETHYLENE GLYCOL 3350 17 G PO PACK: 17.0000 g | PACK | Freq: Every day | ORAL | 0 refills | Status: DC

## 2016-05-04 MED ORDER — SENNOSIDES-DOCUSATE SODIUM 8.6-50 MG PO TABS: 2.0000 | ORAL_TABLET | Freq: Every day | ORAL | 2 refills | Status: DC

## 2016-05-04 NOTE — Discharge Instructions (Signed)
1. Drink at least 8 large glasses of water daily and continue regular activities as                 tolerated.  °2. With each subsequent day with dealing with the constipation, please continue all the                ones that was/were started the previous day and add 1 new thing each day ° A 1 cup of prune juice and orange juice (warmed) orally twice daily ° B. mirlax 17 grams in large glass of water and follow with another glass of water                 up to twice daily. ° C.senna-S 2 orally up to twice daily ° D.mil of magnesia 30 ml orally up to twice daily ° E. Fleet enema rectally as needed ° °3. Once your bowel has moved, you can cut down on the above to regular bowl habit. Most people should have a movement every 1-2 days °4. NOTE: Do not start a fiber supplement during an episode of constipation. ° °

## 2016-05-04 NOTE — ED Triage Notes (Signed)
The patient presented to the [facility] with a complaint of rectal pain that occurs when he tries to have a bowel movement. He stated that it started yesterday.

## 2016-05-04 NOTE — ED Provider Notes (Signed)
CSN: [id number]     Arrival date & time 05/04/16  1145 History   First MD Initiated Contact with Patient 05/04/16 1248     Chief Complaint  Patient presents with  . Rectal Pain   (Consider location/radiation/quality/duration/timing/severity/associated sxs/prior Treatment) HPI 82 y/o male with rectal pain for the last 3 days. Wants to know if he has hemorrhoid, or constipation. Has a hx of constipation. tx about 3 weeks ago for constipation with fleets enema.  No blood in stool. Just painful to have a movement.  Past Medical History:  Diagnosis Date  . Adenomatous colon polyp 02/14/2012  . AICD (automatic cardioverter/defibrillator) present   . Anemia   . Arthritis    "right hip" (12/25/2012)  . BBB (bundle branch block)    right  . CAD (coronary artery disease)    a. s/p AMI, s/p PTCA & stent of cx 12/04;  b. LHC (5/05): Proximal LAD 100% with bridging collaterals (CTO), proximal circumflex 20%, mid circumflex 95% ISR, proximal-mid RCA 75%, EF 20% >>PCI: 3.0 x 28 mm Cypher DES to the mid CFX  . Carotid stenosis    a. s/p L carotid stent 2004;  b. Carotid US (09/2014): Bilateral ICA 1-39%, left ECA >59%, normal subclavian bilaterally, occluded left vertebral >> FU 2 years  . Cataracts, bilateral   . CHF (congestive heart failure) ([place])   . Chronic kidney disease   . Chronic systolic CHF (congestive heart failure) (HCC)    a. ischemic CM EF 15-20%;  b. s/p AICD 05/24/04;  c. Echo 7/06: EF 30-40%, mild reduced RVSF, d. Echo 12/2015 EF 35-40%  . Coronary artery disease   . Diabetes mellitus without complication ([place])   . Diverticulosis of colon   . Dysrhythmia   . Elevated PSA   . Erectile dysfunction   . HTN (hypertension)   . Hyperkalemia 08/2008   K=5.7   . Hyperlipidemia   . Hypertension   . ICD (implantable cardiac defibrillator) in place 12-25-2012   MDT CRTD upgrade by Dr Lovena Le  . Myocardial infarction ([place]) 1990  . Myocardial infarction ([place])   . Pacemaker   . Pericardial  effusion    Echocardiogram (09/2014): EF 25% with distal anterior, distal inferior, distal lateral and apical akinesis, grade 1 diastolic dysfunction, very mild aortic stenosis (mean 7 mmHg) - this may be depressed due to low EF (2-D images suggest mild to moderate aortic stenosis), large pericardial effusion, no RA collapse  . Peripheral vascular disease ([place])   . Presence of permanent cardiac pacemaker   . PVD (peripheral vascular disease) ([place])    s/p L carotid PTCA/stent 2004  . Transient ischemic attack   . Type II diabetes mellitus ([place])    Past Surgical History:  Procedure Laterality Date  . BI-VENTRICULAR IMPLANTABLE CARDIOVERTER DEFIBRILLATOR UPGRADE N/A 12/25/2012   Procedure: BI-VENTRICULAR IMPLANTABLE CARDIOVERTER DEFIBRILLATOR UPGRADE;  Surgeon: Evans Lance, MD;  Location: [facility] CATH LAB;  Service: Cardiovascular;  Laterality: N/A;  . BIV ICD UPGRADE  12/25/2012   MDT CRTD upgrade by Dr Lovena Le for ischemic cardiomyopathy and worsening conduction system disease  . CARDIAC CATHETERIZATION  06/2003,  01/2004  . CARDIAC CATHETERIZATION N/A 01/13/2016   Procedure: Left Heart Cath and Coronary Angiography;  Surgeon: Jettie Booze, MD;  Location: [place] CV LAB;  Service: Cardiovascular;  Laterality: N/A;  . CARDIAC DEFIBRILLATOR PLACEMENT  05/24/2004   Implantation of a MDT single-chamber defibrillator  . CAROTID STENT  09/11/2003   Percutaneous transluminal angioplasty and stent placement  of the left internal carotid artery.  Marland Kitchen CATARACT EXTRACTION W/ INTRAOCULAR LENS  IMPLANT, BILATERAL Bilateral ~ 2010  . CORONARY ANGIOPLASTY WITH STENT PLACEMENT  1990   "2" (12/25/2012)  . INSERT / REPLACE / REMOVE PACEMAKER    . LEAD REVISION N/A 12/25/2012   Procedure: LEAD REVISION;  Surgeon: Evans Lance, MD;  Location: [facility] CATH LAB;  Service: Cardiovascular;  Laterality: N/A;   Family History  Problem Relation Age of Onset  . Diabetes Mother   . Diabetes Brother   . Heart attack Neg Hx    . Stroke Neg Hx    Social History  Substance Use Topics  . Smoking status: Former Smoker    Types: Cigarettes    Quit date: 12/27/1967  . Smokeless tobacco: Never Used  . Alcohol use No     Comment: 12/25/2012 "quit all alcohol 60 yr ago"    Review of Systems  Denies: HEADACHE, NAUSEA, ABDOMINAL PAIN, CHEST PAIN, CONGESTION, DYSURIA, SHORTNESS OF BREATH  Allergies  Review of patient's allergies indicates no known allergies.  Home Medications   Prior to Admission medications   Medication Sig Start Date End Date Taking? Authorizing Provider  ACCU-CHEK AVIVA PLUS test strip USE TO CHECK BLOOD SUGARS THREE TIMES DAILY TO FOUR TIMES DAILY 11/24/15   Axel Filler, MD  acetaminophen (TYLENOL) 500 MG tablet Take 500 mg by mouth 2 (two) times daily.     Historical Provider, MD  aspirin EC 81 MG tablet Take 81 mg by mouth daily.    Historical Provider, MD  atorvastatin (LIPITOR) 40 MG tablet TAKE 1 TABLET(40 MG) BY MOUTH DAILY AT 6 PM 04/14/16   Axel Filler, MD  atorvastatin (LIPITOR) 40 MG tablet TAKE 1 TABLET(40 MG) BY MOUTH DAILY AT 6 PM 04/24/16   Axel Filler, MD  B-D ULTRAFINE III SHORT PEN 31G X 8 MM MISC USE AS DIRECTED ONCE DAILY FOR INSULIN INJECTION 02/08/16   Oval Linsey, MD  Blood Glucose Monitoring Suppl (ACCU-CHEK AVIVA PLUS) w/Device KIT Please use 3 to 4 times daily to check blood glucose. diag code E11.9. Insulin dependent 11/23/15   Axel Filler, MD  carvedilol (COREG) 12.5 MG tablet Take 0.5 tablets (6.25 mg total) by mouth 2 (two) times daily. 02/11/16   Evans Lance, MD  docusate sodium (COLACE) 100 MG capsule Take 1 capsule (100 mg total) by mouth every 12 (twelve) hours. 04/17/16   Duffy Bruce, MD  furosemide (LASIX) 40 MG tablet TAKE 1 TABLET BY MOUTH TWICE DAILY 02/09/16   Peter M Martinique, MD  Insulin Glargine (LANTUS SOLOSTAR) 100 UNIT/ML Solostar Pen Inject 15 Units into the skin at bedtime. 01/28/16   Norval Gable, MD  Lancets  (ACCU-CHEK MULTICLIX) lancets USE THREE TIMES DAILY TO FOUR TIMES DAILY TO CHECK BLOOD SUGAR 11/24/15   Axel Filler, MD  Lancets Misc. (ACCU-CHEK FASTCLIX LANCET) KIT Please use to check blood sugar 3 to 4 times daily. diag code E11.9. Insulin dependent 11/23/15   Axel Filler, MD  lisinopril (PRINIVIL,ZESTRIL) 5 MG tablet TAKE 1 TABLET BY MOUTH DAILY 02/22/16   Axel Filler, MD  Multiple Vitamins-Minerals (CENTRUM SILVER ADULT 50+) TABS Take 1 tablet by mouth daily.     Historical Provider, MD  pantoprazole (PROTONIX) 40 MG tablet Take 1 tablet (40 mg total) by mouth daily. 04/21/16   Axel Filler, MD  polyethylene glycol [facility]ing) packet Take 17 g by mouth daily as needed for severe constipation.  04/17/16   Duffy Bruce, MD  polyethylene glycol [facility]) packet Take 17 g by mouth daily. 05/04/16   Konrad Felix, PA  senna-docusate (SENOKOT-S) 8.6-50 MG tablet Take 2 tablets by mouth daily. 05/04/16   Konrad Felix, PA  simvastatin (ZOCOR) 40 MG tablet Take 40 mg by mouth daily. 03/20/16   Historical Provider, MD  tamsulosin (FLOMAX) 0.4 MG CAPS capsule Take 0.8 mg by mouth at bedtime.    Historical Provider, MD  VENTOLIN HFA 108 (90 Base) MCG/ACT inhaler INHALE 2 PUFFS INTO THE LUNGS EVERY 6 HOURS AS NEEDED FOR WHEEZING OR SHORTNESS OF BREATH 03/15/16   Axel Filler, MD   Meds Ordered and Administered this Visit  Medications - No data to display  BP 100/55 (BP Location: Left Arm)   Pulse 72   Temp 98.3 F (36.8 C) (Oral)   Resp 16   SpO2 100%  No data found.   Physical Exam NURSES NOTES AND VITAL SIGNS REVIEWED. CONSTITUTIONAL: Well developed, well nourished, no acute distress HEENT: normocephalic, atraumatic EYES: Conjunctiva normal NECK:normal ROM, supple, no adenopathy PULMONARY:No respiratory distress, normal effort ABDOMINAL: Soft, ND, NT BS+, No CVAT, rectal done with patients permission. Reveals large stool bolus in rectal  vault. Prostate without tenderness.no external hemorrhoid noted.  MUSCULOSKELETAL: Normal ROM of all extremities,  SKIN: warm and dry without rash PSYCHIATRIC: Mood and affect, behavior are normal  Urgent Care Course   Clinical Course    Procedures (including critical care time)  Labs Review Labs Reviewed - No data to display  Imaging Review No results found.   Visual Acuity Review  Right Eye Distance:   Left Eye Distance:   Bilateral Distance:    Right Eye Near:   Left Eye Near:    Bilateral Near:         MDM   1. Constipation, unspecified constipation type     Patient is reassured that there are no issues that require transfer to higher level of care at this time or additional tests. Patient is advised to continue home symptomatic treatment. Patient is advised that if there are new or worsening symptoms to attend the emergency department, contact primary care provider, or return to UC. Instructions of care provided discharged home in stable condition.    THIS NOTE WAS GENERATED USING A VOICE RECOGNITION SOFTWARE PROGRAM. ALL REASONABLE EFFORTS  WERE MADE TO PROOFREAD THIS DOCUMENT FOR ACCURACY.  I have verbally reviewed the discharge instructions with the patient. A printed AVS was given to the patient.  All questions were answered prior to discharge.      Konrad Felix, PA 05/04/16 [phone]

## 2016-05-19 ENCOUNTER — Telehealth: Payer: Self-pay | Admitting: Student in an Organized Health Care Education/Training Program

## 2016-05-19 NOTE — Telephone Encounter (Signed)
APT. REMINDER CALL, NO ANSWER °

## 2016-05-22 ENCOUNTER — Encounter: Payer: Self-pay | Admitting: Student in an Organized Health Care Education/Training Program

## 2016-05-22 ENCOUNTER — Ambulatory Visit: Payer: Self-pay | Admitting: Student in an Organized Health Care Education/Training Program

## 2016-05-25 NOTE — Progress Notes (Signed)
ICM remote transmission rescheduled to 06/08/2016 due to waiting on new monitor.

## 2016-06-07 DIAGNOSIS — B309 Viral conjunctivitis, unspecified: Secondary | ICD-10-CM | POA: Diagnosis not present

## 2016-06-07 DIAGNOSIS — Z961 Presence of intraocular lens: Secondary | ICD-10-CM | POA: Diagnosis not present

## 2016-06-08 ENCOUNTER — Telehealth: Payer: Self-pay | Admitting: Cardiology

## 2016-06-08 NOTE — Telephone Encounter (Signed)
Attempted to confirm remote transmission with pt. No answer and was unable to leave a message.   

## 2016-06-16 ENCOUNTER — Other Ambulatory Visit: Payer: Self-pay | Admitting: Student in an Organized Health Care Education/Training Program

## 2016-06-16 NOTE — Progress Notes (Signed)
No ICM remote transmission received for 06/08/2016 and rescheduled for 07/04/2016.

## 2016-06-16 NOTE — Telephone Encounter (Signed)
He has been changed to atorvastatin and should not be taking simvastatin.

## 2016-06-23 DIAGNOSIS — Z961 Presence of intraocular lens: Secondary | ICD-10-CM | POA: Diagnosis not present

## 2016-06-23 DIAGNOSIS — B309 Viral conjunctivitis, unspecified: Secondary | ICD-10-CM | POA: Diagnosis not present

## 2016-06-26 ENCOUNTER — Other Ambulatory Visit: Payer: Self-pay | Admitting: Student in an Organized Health Care Education/Training Program

## 2016-06-28 DIAGNOSIS — N2581 Secondary hyperparathyroidism of renal origin: Secondary | ICD-10-CM | POA: Diagnosis not present

## 2016-06-28 DIAGNOSIS — D631 Anemia in chronic kidney disease: Secondary | ICD-10-CM | POA: Diagnosis not present

## 2016-06-28 DIAGNOSIS — N184 Chronic kidney disease, stage 4 (severe): Secondary | ICD-10-CM | POA: Diagnosis not present

## 2016-07-04 ENCOUNTER — Telehealth: Payer: Self-pay | Admitting: Cardiology

## 2016-07-04 ENCOUNTER — Ambulatory Visit (INDEPENDENT_AMBULATORY_CARE_PROVIDER_SITE_OTHER): Payer: Commercial Managed Care - HMO

## 2016-07-04 DIAGNOSIS — I5022 Chronic systolic (congestive) heart failure: Secondary | ICD-10-CM | POA: Diagnosis not present

## 2016-07-04 DIAGNOSIS — Z95810 Presence of automatic (implantable) cardiac defibrillator: Secondary | ICD-10-CM

## 2016-07-04 NOTE — Telephone Encounter (Signed)
Attempted to confirm remote transmission with pt. No answer and was unable to leave a message.   

## 2016-07-06 ENCOUNTER — Telehealth: Payer: Self-pay

## 2016-07-06 NOTE — Progress Notes (Signed)
EPIC Encounter for ICM Monitoring  Patient Name: [name] is a 82 y.o. male Date: 07/06/2016 Primary Care Physican: Axel Filler, MD Primary Cardiologist: Lovena Le Electrophysiologist: Lovena Le Dry Weight:  unknown Bi-V Pacing:  94.3%       Attempted ICM call and unable to reach.  Transmission reviewed.   Thoracic impedance normal.    Follow-up plan: ICM clinic phone appointment on 08/15/2016.  Copy of ICM check sent to device physician.   ICM trend: 07/06/2016       [name], RN 07/06/2016 10:49 AM

## 2016-07-06 NOTE — Telephone Encounter (Signed)
Remote ICM transmission received.  Attempted patient call and mail box is full. 

## 2016-07-11 ENCOUNTER — Encounter: Payer: Self-pay | Admitting: Internal Medicine

## 2016-07-11 ENCOUNTER — Ambulatory Visit (INDEPENDENT_AMBULATORY_CARE_PROVIDER_SITE_OTHER): Payer: Commercial Managed Care - HMO | Admitting: Internal Medicine

## 2016-07-11 VITALS — BP 84/62 | HR 69 | Ht 70.0 in | Wt 136.6 lb

## 2016-07-11 DIAGNOSIS — Z95810 Presence of automatic (implantable) cardiac defibrillator: Secondary | ICD-10-CM | POA: Diagnosis not present

## 2016-07-11 DIAGNOSIS — I5022 Chronic systolic (congestive) heart failure: Secondary | ICD-10-CM

## 2016-07-11 NOTE — Progress Notes (Signed)
HPI [name] returns today for followup. He is a pleasant 82 yo man with a h/o an ischemic cardiomyopathy, chronic systolic heart failure, ejection fraction 20%, complete heart block, status post biventricular ICD. In the interim, the patient has been stable. He was very sick in the spring and had a VF arrest but is much improved now. He has had a slow improvement and is still quite sedentary. He has gained some of his weight back.  No Known Allergies   Current Outpatient Prescriptions  Medication Sig Dispense Refill  . ACCU-CHEK AVIVA PLUS test strip USE TO CHECK BLOOD SUGARS THREE TIMES DAILY TO FOUR TIMES DAILY 300 each 6  . acetaminophen (TYLENOL) 500 MG tablet Take 500 mg by mouth 2 (two) times daily.     Marland Kitchen aspirin EC 81 MG tablet Take 81 mg by mouth daily.    Marland Kitchen atorvastatin (LIPITOR) 40 MG tablet TAKE 1 TABLET(40 MG) BY MOUTH DAILY AT 6 PM 90 tablet 2  . B-D ULTRAFINE III SHORT PEN 31G X 8 MM MISC USE AS DIRECTED ONCE DAILY FOR INSULIN INJECTION 100 each 3  . Blood Glucose Monitoring Suppl (ACCU-CHEK AVIVA PLUS) w/Device KIT Please use 3 to 4 times daily to check blood glucose. diag code E11.9. Insulin dependent 1 kit 0  . carvedilol (COREG) 12.5 MG tablet Take 0.5 tablets (6.25 mg total) by mouth 2 (two) times daily. 180 tablet 3  . docusate sodium (COLACE) 100 MG capsule Take 1 capsule (100 mg total) by mouth every 12 (twelve) hours. 60 capsule 0  . fluorometholone (FML) 0.1 % ophthalmic suspension Place 1 drop into both eyes as directed.    . furosemide (LASIX) 40 MG tablet TAKE 1 TABLET BY MOUTH TWICE DAILY 180 tablet 0  . Insulin Glargine (LANTUS SOLOSTAR) 100 UNIT/ML Solostar Pen Inject 15 Units into the skin at bedtime. 15 mL 11  . Lancets (ACCU-CHEK MULTICLIX) lancets USE THREE TIMES DAILY TO FOUR TIMES DAILY TO CHECK BLOOD SUGAR 306 each 6  . Lancets Misc. (ACCU-CHEK FASTCLIX LANCET) KIT Please use to check blood sugar 3 to 4 times daily. diag code E11.9. Insulin dependent 1 kit  0  . Multiple Vitamins-Minerals (CENTRUM SILVER ADULT 50+) TABS Take 1 tablet by mouth daily.     . pantoprazole (PROTONIX) 40 MG tablet Take 1 tablet (40 mg total) by mouth daily. 90 tablet 0  . polyethylene glycol (MIRALAX / GLYCOLAX) packet Take 17 g by mouth daily as needed for severe constipation. 14 each 0  . senna-docusate (SENOKOT-S) 8.6-50 MG tablet Take 2 tablets by mouth daily. 30 tablet 2  . tamsulosin (FLOMAX) 0.4 MG CAPS capsule Take 0.8 mg by mouth at bedtime.    . VENTOLIN HFA 108 (90 Base) MCG/ACT inhaler INHALE 2 PUFFS INTO THE LUNGS EVERY 6 HOURS AS NEEDED FOR WHEEZING OR SHORTNESS OF BREATH 18 g 5   No current facility-administered medications for this visit.      Past Medical History:  Diagnosis Date  . Adenomatous colon polyp 02/14/2012  . BBB (bundle branch block)    right  . Carotid stenosis    a. s/p L carotid stent 2004;  b. Carotid US (09/2014): Bilateral ICA 1-39%, left ECA >59%, normal subclavian bilaterally, occluded left vertebral >> FU 2 years  . Chronic kidney disease   . Chronic systolic CHF (congestive heart failure) (HCC)    a. ischemic CM EF 15-20%;  b. s/p AICD 05/24/04;  c. Echo 7/06: EF 30-40%, mild reduced RVSF, d.  Echo 12/2015 EF 35-40%  . Diverticulosis of colon   . Dysrhythmia   . Elevated PSA   . HTN (hypertension)   . Hyperlipidemia   . ICD (implantable cardiac defibrillator) in place 12-25-2012   MDT CRTD upgrade by Dr Lovena Le  . Myocardial infarction 1990  . Pericardial effusion    Echocardiogram (09/2014): EF 25% with distal anterior, distal inferior, distal lateral and apical akinesis, grade 1 diastolic dysfunction, very mild aortic stenosis (mean 7 mmHg) - this may be depressed due to low EF (2-D images suggest mild to moderate aortic stenosis), large pericardial effusion, no RA collapse  . Peripheral vascular disease ([place])   . PVD (peripheral vascular disease) ([place])    s/p L carotid PTCA/stent 2004  . Transient ischemic attack   . Type  II diabetes mellitus (HCC)     ROS:   All systems reviewed and negative except as noted in the HPI.   Past Surgical History:  Procedure Laterality Date  . BI-VENTRICULAR IMPLANTABLE CARDIOVERTER DEFIBRILLATOR UPGRADE N/A 12/25/2012   Procedure: BI-VENTRICULAR IMPLANTABLE CARDIOVERTER DEFIBRILLATOR UPGRADE;  Surgeon: Evans Lance, MD;  Location: [facility] CATH LAB;  Service: Cardiovascular;  Laterality: N/A;  . BIV ICD UPGRADE  12/25/2012   MDT CRTD upgrade by Dr Lovena Le for ischemic cardiomyopathy and worsening conduction system disease  . CARDIAC CATHETERIZATION  06/2003,  01/2004  . CARDIAC CATHETERIZATION N/A 01/13/2016   Procedure: Left Heart Cath and Coronary Angiography;  Surgeon: Jettie Booze, MD;  Location: [place] CV LAB;  Service: Cardiovascular;  Laterality: N/A;  . CARDIAC DEFIBRILLATOR PLACEMENT  05/24/2004   Implantation of a MDT single-chamber defibrillator  . CAROTID STENT  09/11/2003   Percutaneous transluminal angioplasty and stent placement of the left internal carotid artery.  Marland Kitchen CATARACT EXTRACTION W/ INTRAOCULAR LENS  IMPLANT, BILATERAL Bilateral ~ 2010  . CORONARY ANGIOPLASTY WITH STENT PLACEMENT  1990   "2" (12/25/2012)  . INSERT / REPLACE / REMOVE PACEMAKER    . LEAD REVISION N/A 12/25/2012   Procedure: LEAD REVISION;  Surgeon: Evans Lance, MD;  Location[facility] CATH LAB;  Service: Cardiovascular;  Laterality: N/A;     Family History  Problem Relation Age of Onset  . Diabetes Mother   . Diabetes Brother   . Heart attack Neg Hx   . Stroke Neg Hx      Social History   Social History  . Marital status: Widowed    Spouse name: N/A  . Number of children: N/A  . Years of education: 58   Occupational History  . retired    Social History Main Topics  . Smoking status: Former Smoker    Types: Cigarettes    Quit date: 12/27/1967  . Smokeless tobacco: Never Used  . Alcohol use No     Comment: 12/25/2012 "quit all alcohol 60 yr ago"  . Drug use: No  . Sexual  activity: Not on file   Other Topics Concern  . Not on file   Social History Narrative   ** Merged History Encounter **       Single, 2 adult children, daughter in [place], son in [place]     BP (!) 84/62   Pulse 69   Ht _0  (1.778 m)   Wt 136 lb 9.6 oz (62 kg)   BMI 19.60 kg/m   Physical Exam:  Stable but chronically ill appearing 82 year old man, NAD HEENT: Unremarkable Neck:  7 cm JVD, no thyromegally Lungs:  Clear with no wheezes, rales,  or rhonchi. HEART:  Regular rate rhythm, no murmurs, no rubs, no clicks Abd:  soft, positive bowel sounds, no organomegally, no rebound, no guarding Ext:  2 plus pulses, 2+ peripheral edema, no cyanosis, no clubbing Skin:  No rashes no nodules Neuro:  CN II through XII intact, motor grossly intact  ECG - NSR with Biv pacing  DEVICE  Normal device function.  See PaceArt for details.   Assess/Plan: 1. Chronic systolic heart failure - he is sedentary and I have asked him to reduce his salt intake. He will continue his dose of Coreg to 6.25 bid. His blood pressure is a little low. 2. CAD - he has had no progression since his last cath. He denies angina. 3. VT - he has had no recurrent VT therapies since his last admit for VT. No change in meds. 4. ICD - his Medtronic BiV ICD is working normally. Will follow.  Mikle Bosworth.D.

## 2016-07-11 NOTE — Patient Instructions (Signed)
Medication Instructions:  Your physician recommends that you continue on your current medications as directed. Please refer to the Current Medication list given to you today.   Labwork: None Ordered   Testing/Procedures: None Ordered   Follow-Up: Your physician wants you to follow-up in: 6 months with Dr. Lovena Le.  You will receive a reminder letter in the mail two months in advance. If you don't receive a letter, please call our office to schedule the follow-up appointment.  Remote monitoring is used to monitor your ICD from home. This monitoring reduces the number of office visits required to check your device to one time per year. It allows [place] to keep an eye on the functioning of your device to ensure it is working properly. You are scheduled for a device check from home on 10/10/16. You may send your transmission at any time that day. If you have a wireless device, the transmission will be sent automatically. After your physician reviews your transmission, you will receive a postcard with your next transmission date.     Any Other Special Instructions Will Be Listed Below (If Applicable).     If you need a refill on your cardiac medications before your next appointment, please call your pharmacy.

## 2016-07-17 ENCOUNTER — Other Ambulatory Visit: Payer: Self-pay | Admitting: Student in an Organized Health Care Education/Training Program

## 2016-07-17 ENCOUNTER — Emergency Department (HOSPITAL_COMMUNITY)
Admission: EM | Admit: 2016-07-17 | Discharge: 2016-07-17 | Disposition: A | Discharge status: Home / Self Care | Payer: Commercial Managed Care - HMO | Attending: Emergency Medicine | Admitting: Emergency Medicine

## 2016-07-17 ENCOUNTER — Encounter (HOSPITAL_COMMUNITY): Payer: Self-pay | Admitting: Emergency Medicine

## 2016-07-17 DIAGNOSIS — Z7982 Long term (current) use of aspirin: Secondary | ICD-10-CM | POA: Insufficient documentation

## 2016-07-17 DIAGNOSIS — I252 Old myocardial infarction: Secondary | ICD-10-CM | POA: Diagnosis not present

## 2016-07-17 DIAGNOSIS — Z955 Presence of coronary angioplasty implant and graft: Secondary | ICD-10-CM | POA: Insufficient documentation

## 2016-07-17 DIAGNOSIS — K5900 Constipation, unspecified: Secondary | ICD-10-CM | POA: Diagnosis present

## 2016-07-17 DIAGNOSIS — N183 Chronic kidney disease, stage 3 (moderate): Secondary | ICD-10-CM | POA: Insufficient documentation

## 2016-07-17 DIAGNOSIS — Z87891 Personal history of nicotine dependence: Secondary | ICD-10-CM | POA: Insufficient documentation

## 2016-07-17 DIAGNOSIS — Z95810 Presence of automatic (implantable) cardiac defibrillator: Secondary | ICD-10-CM | POA: Diagnosis not present

## 2016-07-17 DIAGNOSIS — E1122 Type 2 diabetes mellitus with diabetic chronic kidney disease: Secondary | ICD-10-CM | POA: Diagnosis not present

## 2016-07-17 DIAGNOSIS — Z794 Long term (current) use of insulin: Secondary | ICD-10-CM | POA: Insufficient documentation

## 2016-07-17 DIAGNOSIS — K5641 Fecal impaction: Secondary | ICD-10-CM | POA: Diagnosis not present

## 2016-07-17 DIAGNOSIS — I130 Hypertensive heart and chronic kidney disease with heart failure and stage 1 through stage 4 chronic kidney disease, or unspecified chronic kidney disease: Secondary | ICD-10-CM | POA: Diagnosis not present

## 2016-07-17 DIAGNOSIS — I5022 Chronic systolic (congestive) heart failure: Secondary | ICD-10-CM | POA: Insufficient documentation

## 2016-07-17 NOTE — Discharge Instructions (Signed)
Continue taking stool softener and Miralax daily.  Follow up with your primary care provider for discussion of today's diagnosis.  Return to ER for new or worsening symptoms, any additional concerns.

## 2016-07-17 NOTE — ED Notes (Signed)
Pt ambulated to restroom from room, tolerated well. 

## 2016-07-17 NOTE — ED Provider Notes (Signed)
[place] DEPT Provider Note   CSN: [id number] Arrival date & time: 07/17/16  4193     History   Chief Complaint Chief Complaint  Patient presents with  . Constipation    HPI [name] is a 82 y.o. male.  The history is provided by the patient and medical records. No language interpreter was used.  Constipation   Pertinent negatives include no abdominal pain and no dysuria.   [name] is a 82 y.o. male  with a PMH of DM, [name], PVD, HTN, HLD, CHF who presents to the Emergency Department complaining of achy rectal pain that began while having a bowel movement at 4am this morning. Patient states he did have a soft non-bloody bowel movement and pain improved following BM. No pain at present, however patient states his rectum still feels "full". Patient denies abdominal pain, nausea, vomiting, back pain, fevers. Patient takes stool softener and Miralax daily but no other meds taken prior to arrival for symptoms.   Past Medical History:  Diagnosis Date  . Adenomatous colon polyp 02/14/2012  . BBB (bundle branch block)    right  . Carotid stenosis    a. s/p L carotid stent 2004;  b. Carotid US (09/2014): Bilateral ICA 1-39%, left ECA >59%, normal subclavian bilaterally, occluded left vertebral >> FU 2 years  . Chronic kidney disease   . Chronic systolic CHF (congestive heart failure) (HCC)    a. ischemic CM EF 15-20%;  b. s/p AICD 05/24/04;  c. Echo 7/06: EF 30-40%, mild reduced RVSF, d. Echo 12/2015 EF 35-40%  . Diverticulosis of colon   . Dysrhythmia   . Elevated PSA   . HTN (hypertension)   . Hyperlipidemia   . ICD (implantable cardiac defibrillator) in place 12-25-2012   MDT CRTD upgrade by Dr Lovena Le  . Myocardial infarction 1990  . Pericardial effusion    Echocardiogram (09/2014): EF 25% with distal anterior, distal inferior, distal lateral and apical akinesis, grade 1 diastolic dysfunction, very mild aortic stenosis (mean 7 mmHg) - this may be depressed due to  low EF (2-D images suggest mild to moderate aortic stenosis), large pericardial effusion, no RA collapse  . Peripheral vascular disease ([place])   . PVD (peripheral vascular disease) ([place])    s/p L carotid PTCA/stent 2004  . Transient ischemic attack   . Type II diabetes mellitus [facility])     Patient Active Problem List   Diagnosis Date Noted  . Urinary retention 01/13/2016  . Healthcare maintenance 11/22/2015  . [name] (chronic kidney disease) stage 3, GFR 30-59 ml/min 12/04/2014  . Pericardial effusion 11/27/2014  . Automatic implantable cardioverter-defibrillator in situ 01/16/2012  . Coronary atherosclerosis 12/25/2007  . Chronic systolic congestive heart failure ([place]) 12/25/2007  . Hyperlipidemia 01/02/2007  . Type 2 diabetes mellitus with peripheral vascular disease ([place]) 01/01/1989    Past Surgical History:  Procedure Laterality Date  . BI-VENTRICULAR IMPLANTABLE CARDIOVERTER DEFIBRILLATOR UPGRADE N/A 12/25/2012   Procedure: BI-VENTRICULAR IMPLANTABLE CARDIOVERTER DEFIBRILLATOR UPGRADE;  Surgeon: Evans Lance, MD;  Location: [facility] CATH LAB;  Service: Cardiovascular;  Laterality: N/A;  . BIV ICD UPGRADE  12/25/2012   MDT CRTD upgrade by Dr Lovena Le for ischemic cardiomyopathy and worsening conduction system disease  . CARDIAC CATHETERIZATION  06/2003,  01/2004  . CARDIAC CATHETERIZATION N/A 01/13/2016   Procedure: Left Heart Cath and Coronary Angiography;  Surgeon: Jettie Booze, MD;  Location: [place] CV LAB;  Service: Cardiovascular;  Laterality: N/A;  . CARDIAC DEFIBRILLATOR PLACEMENT  05/24/2004  Implantation of a MDT single-chamber defibrillator  . CAROTID STENT  09/11/2003   Percutaneous transluminal angioplasty and stent placement of the left internal carotid artery.  Marland Kitchen CATARACT EXTRACTION W/ INTRAOCULAR LENS  IMPLANT, BILATERAL Bilateral ~ 2010  . CORONARY ANGIOPLASTY WITH STENT PLACEMENT  1990   "2" (12/25/2012)  . INSERT / REPLACE / REMOVE PACEMAKER    . LEAD REVISION N/A  12/25/2012   Procedure: LEAD REVISION;  Surgeon: Marinus Maw, MD;  Location: [facility] CATH LAB;  Service: Cardiovascular;  Laterality: N/A;       Home Medications    Prior to Admission medications   Medication Sig Start Date End Date Taking? Authorizing Provider  acetaminophen (TYLENOL) 500 MG tablet Take 500 mg by mouth 2 (two) times daily.    Yes Historical Provider, MD  aspirin EC 81 MG tablet Take 81 mg by mouth daily.   Yes Historical Provider, MD  atorvastatin (LIPITOR) 40 MG tablet TAKE 1 TABLET(40 MG) BY MOUTH DAILY AT 6 PM 04/14/16  Yes Tyson Alias, MD  carvedilol (COREG) 12.5 MG tablet Take 0.5 tablets (6.25 mg total) by mouth 2 (two) times daily. 02/11/16  Yes Marinus Maw, MD  docusate sodium (COLACE) 100 MG capsule Take 1 capsule (100 mg total) by mouth every 12 (twelve) hours. 04/17/16  Yes Shaune Pollack, MD  fluorometholone (FML) 0.1 % ophthalmic suspension Place 1 drop into both eyes 2 (two) times daily.  06/23/16  Yes Historical Provider, MD  furosemide (LASIX) 40 MG tablet TAKE 1 TABLET BY MOUTH TWICE DAILY 02/09/16  Yes Peter M Sw[place], MD  Insulin Glargine (LANTUS SOLOSTAR) 100 UNIT/ML Solostar Pen Inject 15 Units into the skin at bedtime. 01/28/16  Yes Darrick Huntsman, MD  Multiple Vitamins-Minerals (CENTRUM SILVER ADULT 50+) TABS Take 1 tablet by mouth daily.    Yes Historical Provider, MD  pantoprazole (PROTONIX) 40 MG tablet Take 1 tablet (40 mg total) by mouth daily. 04/21/16  Yes Tyson Alias, MD  polyethylene glycol [facility] Ethelene Hal) packet Take 17 g by mouth daily as needed for severe constipation. 04/17/16  Yes Shaune Pollack, MD  senna-docusate (SENOKOT-S) 8.6-50 MG tablet Take 2 tablets by mouth daily. 05/04/16  Yes Tharon Aquas, PA  tamsulosin (FLOMAX) 0.4 MG CAPS capsule Take 0.8 mg by mouth at bedtime.   Yes Historical Provider, MD  VENTOLIN HFA 108 (90 Base) MCG/ACT inhaler INHALE 2 PUFFS INTO THE LUNGS EVERY 6 HOURS AS NEEDED FOR WHEEZING OR  SHORTNESS OF BREATH 03/15/16  Yes Tyson Alias, MD  ACCU-CHEK AVIVA PLUS test strip USE TO CHECK BLOOD SUGARS THREE TIMES DAILY TO FOUR TIMES DAILY 11/24/15   Tyson Alias, MD  B-D ULTRAFINE III SHORT PEN 31G X 8 MM MISC USE AS DIRECTED ONCE DAILY FOR INSULIN INJECTION 02/08/16   Doneen Poisson, MD  Blood Glucose Monitoring Suppl (ACCU-CHEK AVIVA PLUS) w/Device KIT Please use 3 to 4 times daily to check blood glucose. diag code E11.9. Insulin dependent 11/23/15   Tyson Alias, MD  Lancets (ACCU-CHEK MULTICLIX) lancets USE THREE TIMES DAILY TO FOUR TIMES DAILY TO CHECK BLOOD SUGAR 11/24/15   Tyson Alias, MD  Lancets Misc. (ACCU-CHEK FASTCLIX LANCET) KIT Please use to check blood sugar 3 to 4 times daily. diag code E11.9. Insulin dependent 11/23/15   Tyson Alias, MD    Family History Family History  Problem Relation Age of Onset  . Diabetes Mother   . Diabetes Brother   . Heart  attack Neg Hx   . Stroke Neg Hx     Social History Social History  Substance Use Topics  . Smoking status: Former Smoker    Types: Cigarettes    Quit date: 12/27/1967  . Smokeless tobacco: Never Used  . Alcohol use No     Comment: 12/25/2012 "quit all alcohol 60 yr ago"     Allergies   Review of patient's allergies indicates no known allergies.   Review of Systems Review of Systems  Constitutional: Negative for fever.  HENT: Negative for congestion.   Eyes: Negative for visual disturbance.  Respiratory: Negative for shortness of breath.   Cardiovascular: Negative.   Gastrointestinal: Positive for constipation. Negative for abdominal pain, blood in stool, diarrhea, nausea and vomiting.  Genitourinary: Negative for dysuria.  Musculoskeletal: Negative for back pain.  Skin: Negative for color change.  Neurological: Negative for headaches.     Physical Exam Updated Vital Signs BP 100/68   Pulse 74   Resp 20   SpO2 100%   Physical Exam  Constitutional: He is  oriented to person, place, and time. He appears well-developed and well-nourished. No distress.  HENT:  Head: Normocephalic and atraumatic.  Cardiovascular: Normal rate, regular rhythm and normal heart sounds.   No murmur heard. Pulmonary/Chest: Effort normal and breath sounds normal. No respiratory distress. He has no wheezes. He has no rales. He exhibits no tenderness.  Abdominal:  Soft, NT, ND. Bowel sounds + x 4.   Genitourinary: Rectal exam shows no fissure and anal tone normal.  Genitourinary Comments: Fecal impaction.   Musculoskeletal:  No midline or paraspinal T/L spine tenderness.   Neurological: He is alert and oriented to person, place, and time.  Skin: Skin is warm and dry.  Nursing note and vitals reviewed.    ED Treatments / Results  Labs (all labs ordered are listed, but only abnormal results are displayed) Labs Reviewed - No data to display  EKG  EKG Interpretation None       Radiology No results found.  Procedures Fecal disimpaction Date/Time: 07/17/2016 6:48 AM Performed by: Seward Speck Authorized by: Seward Speck  Consent: Verbal consent obtained. Risks and benefits: risks, benefits and alternatives were discussed Consent given by: patient Patient understanding: patient states understanding of the procedure being performed Patient identity confirmed: verbally with patient Patient tolerance: Patient tolerated the procedure well with no immediate complications Comments: Digital disimpaction performed with moderate amount of soft light brown stool.     (including critical care time)  Medications Ordered in ED Medications - No data to display   Initial Impression / Assessment and Plan / ED Course  I have reviewed the triage vital signs and the nursing notes.  Pertinent labs & imaging results that were available during my care of the patient were reviewed by me and considered in my medical decision making (see chart for  details).  Clinical Course   [name] is a 82 y.o. male who presents to ED for rectal pain that began this morning while having a bowel movement. No abdominal pain or tenderness. No back pain or tenderness. Rectal exam with fecal impaction. Manual disimpaction performed at bedside as dictated above. Patient feels improved following procedure. Continue Miralax and stool softener. PCP follow up encouraged. Reasons to return to ED discussed and all questions answered.   Patient seen by and discussed with Dr. Dina Rich who agrees with treatment plan.   Final Clinical Impressions(s) / ED Diagnoses   Final diagnoses:  Fecal impaction [facility])    New Prescriptions New Prescriptions   No medications on file     [facility] Cheo Selvey, PA-C 07/17/16 [place], MD 07/19/16 [phone]

## 2016-07-17 NOTE — ED Triage Notes (Signed)
Per pt, he ate heavy food last night, woke up this morning with difficulty having a bowel movement. Pt states it was painful.

## 2016-07-26 DIAGNOSIS — B309 Viral conjunctivitis, unspecified: Secondary | ICD-10-CM | POA: Diagnosis not present

## 2016-07-26 DIAGNOSIS — Z961 Presence of intraocular lens: Secondary | ICD-10-CM | POA: Diagnosis not present

## 2016-08-15 ENCOUNTER — Telehealth: Payer: Self-pay

## 2016-08-15 ENCOUNTER — Ambulatory Visit (INDEPENDENT_AMBULATORY_CARE_PROVIDER_SITE_OTHER): Payer: Commercial Managed Care - HMO

## 2016-08-15 DIAGNOSIS — Z95810 Presence of automatic (implantable) cardiac defibrillator: Secondary | ICD-10-CM | POA: Diagnosis not present

## 2016-08-15 DIAGNOSIS — I5022 Chronic systolic (congestive) heart failure: Secondary | ICD-10-CM

## 2016-08-15 NOTE — Progress Notes (Signed)
EPIC Encounter for ICM Monitoring  Patient Name: [name] is a 82 y.o. male Date: 08/15/2016 Primary Care Physican: Axel Filler, MD Primary Cardiologist:Taylor Electrophysiologist: Lovena Le Dry Weight:     unknown Bi-V Pacing:  93.1%       Attempted ICM call and unable to reach.   Transmission reviewed.   Thoracic impedance normal   Follow-up plan: ICM clinic phone appointment on 09/15/2016.  Copy of ICM check sent to physician.   ICM trend: 08/15/2016       Robert Billings, RN 08/15/2016 12:46 PM

## 2016-08-15 NOTE — Telephone Encounter (Signed)
Remote ICM transmission received.  Attempted patient call and no answer 

## 2016-08-17 ENCOUNTER — Encounter (HOSPITAL_COMMUNITY): Payer: Self-pay | Admitting: Pharmacy Technician

## 2016-08-17 ENCOUNTER — Emergency Department (HOSPITAL_COMMUNITY)
Admission: EM | Admit: 2016-08-17 | Discharge: 2016-08-18 | Disposition: A | Discharge status: Home / Self Care | Payer: Commercial Managed Care - HMO | Attending: Emergency Medicine | Admitting: Emergency Medicine

## 2016-08-17 DIAGNOSIS — Z794 Long term (current) use of insulin: Secondary | ICD-10-CM | POA: Diagnosis not present

## 2016-08-17 DIAGNOSIS — I130 Hypertensive heart and chronic kidney disease with heart failure and stage 1 through stage 4 chronic kidney disease, or unspecified chronic kidney disease: Secondary | ICD-10-CM | POA: Diagnosis not present

## 2016-08-17 DIAGNOSIS — I5022 Chronic systolic (congestive) heart failure: Secondary | ICD-10-CM | POA: Diagnosis not present

## 2016-08-17 DIAGNOSIS — E1122 Type 2 diabetes mellitus with diabetic chronic kidney disease: Secondary | ICD-10-CM | POA: Insufficient documentation

## 2016-08-17 DIAGNOSIS — K5641 Fecal impaction: Secondary | ICD-10-CM | POA: Insufficient documentation

## 2016-08-17 DIAGNOSIS — I252 Old myocardial infarction: Secondary | ICD-10-CM | POA: Insufficient documentation

## 2016-08-17 DIAGNOSIS — Z79899 Other long term (current) drug therapy: Secondary | ICD-10-CM | POA: Diagnosis not present

## 2016-08-17 DIAGNOSIS — Z955 Presence of coronary angioplasty implant and graft: Secondary | ICD-10-CM | POA: Diagnosis not present

## 2016-08-17 DIAGNOSIS — Z87891 Personal history of nicotine dependence: Secondary | ICD-10-CM | POA: Insufficient documentation

## 2016-08-17 DIAGNOSIS — N183 Chronic kidney disease, stage 3 (moderate): Secondary | ICD-10-CM | POA: Diagnosis not present

## 2016-08-17 DIAGNOSIS — Z7982 Long term (current) use of aspirin: Secondary | ICD-10-CM | POA: Diagnosis not present

## 2016-08-17 DIAGNOSIS — K6289 Other specified diseases of anus and rectum: Secondary | ICD-10-CM | POA: Diagnosis present

## 2016-08-17 NOTE — ED Provider Notes (Signed)
[place] DEPT Provider Note   CSN: [id number] Arrival date & time: 08/17/16  1538     History   Chief Complaint Chief Complaint  Patient presents with  . Rectal Problems    HPI [name] is a 82 y.o. male.  The history is provided by the patient.  Male GU Problem  Primary symptoms comment: rectal pain. This is a new problem. The current episode started 12 to 24 hours ago. The problem occurs constantly. The problem has not changed since onset.The symptoms occur during defecation. Associated symptoms include constipation (intermittent). Pertinent negatives include no anorexia. There has been no fever. Treatments tried: miralax. The treatment provided no relief.    Past Medical History:  Diagnosis Date  . Adenomatous colon polyp 02/14/2012  . BBB (bundle branch block)    right  . Carotid stenosis    a. s/p L carotid stent 2004;  b. Carotid US (09/2014): Bilateral ICA 1-39%, left ECA >59%, normal subclavian bilaterally, occluded left vertebral >> FU 2 years  . Chronic kidney disease   . Chronic systolic CHF (congestive heart failure) (HCC)    a. ischemic CM EF 15-20%;  b. s/p AICD 05/24/04;  c. Echo 7/06: EF 30-40%, mild reduced RVSF, d. Echo 12/2015 EF 35-40%  . Diverticulosis of colon   . Dysrhythmia   . Elevated PSA   . HTN (hypertension)   . Hyperlipidemia   . ICD (implantable cardiac defibrillator) in place 12-25-2012   MDT CRTD upgrade by Dr Lovena Le  . Myocardial infarction 1990  . Pericardial effusion    Echocardiogram (09/2014): EF 25% with distal anterior, distal inferior, distal lateral and apical akinesis, grade 1 diastolic dysfunction, very mild aortic stenosis (mean 7 mmHg) - this may be depressed due to low EF (2-D images suggest mild to moderate aortic stenosis), large pericardial effusion, no RA collapse  . Peripheral vascular disease ([place])   . PVD (peripheral vascular disease) ([place])    s/p L carotid PTCA/stent 2004  . Transient ischemic attack   .  Type II diabetes mellitus [facility])     Patient Active Problem List   Diagnosis Date Noted  . Urinary retention 01/13/2016  . Healthcare maintenance 11/22/2015  . [name] (chronic kidney disease) stage 3, GFR 30-59 ml/min 12/04/2014  . Pericardial effusion 11/27/2014  . Automatic implantable cardioverter-defibrillator in situ 01/16/2012  . Coronary atherosclerosis 12/25/2007  . Chronic systolic congestive heart failure ([place]) 12/25/2007  . Hyperlipidemia 01/02/2007  . Type 2 diabetes mellitus with peripheral vascular disease ([place]) 01/01/1989    Past Surgical History:  Procedure Laterality Date  . BI-VENTRICULAR IMPLANTABLE CARDIOVERTER DEFIBRILLATOR UPGRADE N/A 12/25/2012   Procedure: BI-VENTRICULAR IMPLANTABLE CARDIOVERTER DEFIBRILLATOR UPGRADE;  Surgeon: Evans Lance, MD;  Location: [facility] CATH LAB;  Service: Cardiovascular;  Laterality: N/A;  . BIV ICD UPGRADE  12/25/2012   MDT CRTD upgrade by Dr Lovena Le for ischemic cardiomyopathy and worsening conduction system disease  . CARDIAC CATHETERIZATION  06/2003,  01/2004  . CARDIAC CATHETERIZATION N/A 01/13/2016   Procedure: Left Heart Cath and Coronary Angiography;  Surgeon: Jettie Booze, MD;  Location: [place] CV LAB;  Service: Cardiovascular;  Laterality: N/A;  . CARDIAC DEFIBRILLATOR PLACEMENT  05/24/2004   Implantation of a MDT single-chamber defibrillator  . CAROTID STENT  09/11/2003   Percutaneous transluminal angioplasty and stent placement of the left internal carotid artery.  Marland Kitchen CATARACT EXTRACTION W/ INTRAOCULAR LENS  IMPLANT, BILATERAL Bilateral ~ 2010  . CORONARY ANGIOPLASTY WITH STENT PLACEMENT  1990   "2" (  12/25/2012)  . INSERT / REPLACE / REMOVE PACEMAKER    . LEAD REVISION N/A 12/25/2012   Procedure: LEAD REVISION;  Surgeon: Evans Lance, MD;  Location: [facility] CATH LAB;  Service: Cardiovascular;  Laterality: N/A;       Home Medications    Prior to Admission medications   Medication Sig Start Date End Date Taking?  Authorizing Provider  ACCU-CHEK AVIVA PLUS test strip USE TO CHECK BLOOD SUGARS THREE TIMES DAILY TO FOUR TIMES DAILY 11/24/15   Axel Filler, MD  acetaminophen (TYLENOL) 500 MG tablet Take 500 mg by mouth 2 (two) times daily.     Historical Provider, MD  aspirin EC 81 MG tablet Take 81 mg by mouth daily.    Historical Provider, MD  atorvastatin (LIPITOR) 40 MG tablet TAKE 1 TABLET(40 MG) BY MOUTH DAILY AT 6 PM 04/14/16   Axel Filler, MD  B-D ULTRAFINE III SHORT PEN 31G X 8 MM MISC USE AS DIRECTED ONCE DAILY FOR INSULIN INJECTION 02/08/16   Oval Linsey, MD  Blood Glucose Monitoring Suppl (ACCU-CHEK AVIVA PLUS) w/Device KIT Please use 3 to 4 times daily to check blood glucose. diag code E11.9. Insulin dependent 11/23/15   Axel Filler, MD  carvedilol (COREG) 12.5 MG tablet Take 0.5 tablets (6.25 mg total) by mouth 2 (two) times daily. 02/11/16   Evans Lance, MD  docusate sodium (COLACE) 100 MG capsule Take 1 capsule (100 mg total) by mouth every 12 (twelve) hours. 04/17/16   Duffy Bruce, MD  fluorometholone (FML) 0.1 % ophthalmic suspension Place 1 drop into both eyes 2 (two) times daily.  06/23/16   Historical Provider, MD  furosemide (LASIX) 40 MG tablet TAKE 1 TABLET BY MOUTH TWICE DAILY 02/09/16   Peter M Martinique, MD  Insulin Glargine (LANTUS SOLOSTAR) 100 UNIT/ML Solostar Pen Inject 15 Units into the skin at bedtime. 01/28/16   Norval Gable, MD  Lancets (ACCU-CHEK MULTICLIX) lancets USE THREE TIMES DAILY TO FOUR TIMES DAILY TO CHECK BLOOD SUGAR 11/24/15   Axel Filler, MD  Lancets Misc. (ACCU-CHEK FASTCLIX LANCET) KIT Please use to check blood sugar 3 to 4 times daily. diag code E11.9. Insulin dependent 11/23/15   Axel Filler, MD  Multiple Vitamins-Minerals (CENTRUM SILVER ADULT 50+) TABS Take 1 tablet by mouth daily.     Historical Provider, MD  pantoprazole (PROTONIX) 40 MG tablet TAKE 1 TABLET(40 MG) BY MOUTH DAILY 07/18/16   Axel Filler,  MD  polyethylene glycol [facility] Floria Raveling) packet Take 17 g by mouth daily as needed for severe constipation. 04/17/16   Duffy Bruce, MD  senna-docusate (SENOKOT-S) 8.6-50 MG tablet Take 2 tablets by mouth daily. 05/04/16   Konrad Felix, PA  tamsulosin (FLOMAX) 0.4 MG CAPS capsule Take 0.8 mg by mouth at bedtime.    Historical Provider, MD  VENTOLIN HFA 108 (90 Base) MCG/ACT inhaler INHALE 2 PUFFS INTO THE LUNGS EVERY 6 HOURS AS NEEDED FOR WHEEZING OR SHORTNESS OF BREATH 03/15/16   Axel Filler, MD    Family History Family History  Problem Relation Age of Onset  . Diabetes Mother   . Diabetes Brother   . Heart attack Neg Hx   . Stroke Neg Hx     Social History Social History  Substance Use Topics  . Smoking status: Former Smoker    Types: Cigarettes    Quit date: 12/27/1967  . Smokeless tobacco: Never Used  . Alcohol use No  Comment: 12/25/2012 "quit all alcohol 60 yr ago"     Allergies   Patient has no known allergies.   Review of Systems Review of Systems  Gastrointestinal: Positive for constipation (intermittent). Negative for anorexia.  All other systems reviewed and are negative.    Physical Exam Updated Vital Signs BP 160/79   Pulse 64   Temp 97.6 F (36.4 C) (Oral)   Resp 14   Ht '5\' 10"'$  (1.778 m)   Wt 150 lb (68 kg)   SpO2 100%   BMI 21.52 kg/m   Physical Exam  Constitutional: He is oriented to person, place, and time. He appears well-developed and well-nourished. No distress.  HENT:  Head: Normocephalic and atraumatic.  Nose: Nose normal.  Eyes: Conjunctivae are normal.  Neck: Neck supple. No tracheal deviation present.  Cardiovascular: Normal rate and regular rhythm.   Pulmonary/Chest: Effort normal. No respiratory distress.  Abdominal: Soft. He exhibits no distension.  Genitourinary: Rectum normal.  Genitourinary Comments: Impacted stool ball  Neurological: He is alert and oriented to person, place, and time.  Skin: Skin is warm  and dry.  Psychiatric: He has a normal mood and affect.     ED Treatments / Results  Labs (all labs ordered are listed, but only abnormal results are displayed) Labs Reviewed - No data to display  EKG  EKG Interpretation None       Radiology No results found.  Procedures Procedures (including critical care time)  Medications Ordered in ED Medications - No data to display   Initial Impression / Assessment and Plan / ED Course  I have reviewed the triage vital signs and the nursing notes.  Pertinent labs & imaging results that were available during my care of the patient were reviewed by me and considered in my medical decision making (see chart for details).  Clinical Course     82 y.o. male presents with rectal pain. Has impacted stool ball on exam which was manually disimpacted and Pt able to have bowel movement and feeling better. Plan to follow up with PCP as needed and return precautions discussed for worsening or new concerning symptoms.   Final Clinical Impressions(s) / ED Diagnoses   Final diagnoses:  Fecal impaction in rectum [facility])    New Prescriptions New Prescriptions   No medications on file     [name], MD 08/18/16 [phone]

## 2016-08-17 NOTE — ED Triage Notes (Signed)
Pt presents to the ED with reports of taking miralax yesterday and today he started having rectal pain when having a BM. States he is still having pain just sitting here. Pt reports his BM was soft and denies any blood in his stool.

## 2016-09-15 ENCOUNTER — Ambulatory Visit (INDEPENDENT_AMBULATORY_CARE_PROVIDER_SITE_OTHER): Payer: Commercial Managed Care - HMO

## 2016-09-15 ENCOUNTER — Telehealth: Payer: Self-pay

## 2016-09-15 DIAGNOSIS — Z95810 Presence of automatic (implantable) cardiac defibrillator: Secondary | ICD-10-CM

## 2016-09-15 DIAGNOSIS — I5022 Chronic systolic (congestive) heart failure: Secondary | ICD-10-CM | POA: Diagnosis not present

## 2016-09-15 NOTE — Progress Notes (Signed)
EPIC Encounter for ICM Monitoring  Patient Name: [name] is a 82 y.o. male Date: 09/15/2016 Primary Care Physican: Axel Filler, MD Primary Cardiologist:Taylor Electrophysiologist: Druscilla Brownie Weight:unknown Bi-V Pacing:  91.7%                                                         Attempted ICM call and unable to reach.  Left detailed message regarding transmission.  Transmission reviewed.   Thoracic impedance normal   Recommendations:  Left direct ICM number and encouraged to call for fluid symptoms.    Follow-up plan: ICM clinic phone appointment on 10/16/2016.  Copy of ICM check sent to device physician.   ICM trend: 09/05/2016       [name], RN 09/15/2016 8:34 AM

## 2016-09-15 NOTE — Telephone Encounter (Signed)
Remote ICM transmission received.  Attempted patient call and left detailed message regarding transmission and next ICM scheduled for 10/16/2016.  Advised to return call for any fluid symptoms or questions.

## 2016-10-16 ENCOUNTER — Ambulatory Visit (INDEPENDENT_AMBULATORY_CARE_PROVIDER_SITE_OTHER): Payer: Medicare HMO | Admitting: *Deleted

## 2016-10-16 DIAGNOSIS — I472 Ventricular tachycardia, unspecified: Secondary | ICD-10-CM

## 2016-10-16 DIAGNOSIS — I5022 Chronic systolic (congestive) heart failure: Secondary | ICD-10-CM

## 2016-10-16 DIAGNOSIS — Z95810 Presence of automatic (implantable) cardiac defibrillator: Secondary | ICD-10-CM

## 2016-10-16 NOTE — Progress Notes (Signed)
EPIC Encounter for ICM Monitoring  Patient Name: [name] is a 83 y.o. male Date: 10/16/2016 Primary Care Physican: Axel Filler, MD Primary Cardiologist:Taylor Electrophysiologist: Druscilla Brownie Weight:unknown Bi-V Pacing:  95.5%       Heart Failure questions reviewed, pt asymptomatic   Thoracic impedance close to baseline normal .  Recommendations: No changes. Discussed limiting dietary salt intake to 2000 mg/day and fluid intake to < 2 liters per day. Encouraged to call for fluid symptoms.  Follow-up plan: ICM clinic phone appointment on 11/16/2016.  Copy of ICM check sent to device physician.   3 month ICM trend: 10/16/2016   1 Year ICM trend:      [name], RN 10/16/2016 3:28 PM

## 2016-10-16 NOTE — Progress Notes (Signed)
Remote ICD transmission.   

## 2016-10-17 LAB — CUP PACEART REMOTE DEVICE CHECK
Battery Remaining Longevity: 48 mo
Battery Voltage: 2.96 V
Brady Statistic AP VP Percent: 21.42 %
Brady Statistic AP VS Percent: 0.27 %
Brady Statistic AS VS Percent: 2.44 %
Brady Statistic RV Percent Paced: 95.46 %
HighPow Impedance: 56 Ohm
Implantable Lead Implant Date: 20140402
Implantable Lead Implant Date: 20140402
Implantable Lead Location: 753858
Implantable Lead Location: 753860
Implantable Lead Model: 4194
Implantable Lead Model: 5076
Implantable Lead Model: 6935
Implantable Pulse Generator Implant Date: 20140402
Lead Channel Impedance Value: 399 Ohm
Lead Channel Impedance Value: 475 Ohm
Lead Channel Impedance Value: 551 Ohm
Lead Channel Pacing Threshold Amplitude: 0.75 V
Lead Channel Pacing Threshold Amplitude: 1 V
Lead Channel Sensing Intrinsic Amplitude: 22 mV
Lead Channel Sensing Intrinsic Amplitude: 22 mV
Lead Channel Sensing Intrinsic Amplitude: 4.125 mV
Lead Channel Sensing Intrinsic Amplitude: 4.125 mV
Lead Channel Setting Pacing Amplitude: 1.5 V
Lead Channel Setting Pacing Amplitude: 2.5 V
Lead Channel Setting Pacing Pulse Width: 0.4 ms
Lead Channel Setting Pacing Pulse Width: 0.6 ms
MDC IDC LEAD IMPLANT DT: 20140402
MDC IDC LEAD LOCATION: 753859
MDC IDC MSMT LEADCHNL LV IMPEDANCE VALUE: 228 Ohm
MDC IDC MSMT LEADCHNL LV IMPEDANCE VALUE: 456 Ohm
MDC IDC MSMT LEADCHNL LV PACING THRESHOLD PULSEWIDTH: 0.6 ms
MDC IDC MSMT LEADCHNL RA PACING THRESHOLD AMPLITUDE: 0.625 V
MDC IDC MSMT LEADCHNL RA PACING THRESHOLD PULSEWIDTH: 0.4 ms
MDC IDC MSMT LEADCHNL RV IMPEDANCE VALUE: 399 Ohm
MDC IDC MSMT LEADCHNL RV PACING THRESHOLD PULSEWIDTH: 0.4 ms
MDC IDC SESS DTM: 20180122051807
MDC IDC SET LEADCHNL RA PACING AMPLITUDE: 2 V
MDC IDC SET LEADCHNL RV SENSING SENSITIVITY: 0.3 mV
MDC IDC STAT BRADY AS VP PERCENT: 75.86 %
MDC IDC STAT BRADY RA PERCENT PACED: 21.16 %

## 2016-10-18 ENCOUNTER — Encounter: Payer: Self-pay | Admitting: Cardiology

## 2016-10-19 ENCOUNTER — Ambulatory Visit (HOSPITAL_COMMUNITY)
Admission: EM | Admit: 2016-10-19 | Discharge: 2016-10-19 | Disposition: A | Discharge status: Home / Self Care | Payer: Medicare HMO | Attending: Family Medicine | Admitting: Family Medicine

## 2016-10-19 ENCOUNTER — Encounter (HOSPITAL_COMMUNITY): Payer: Self-pay | Admitting: Emergency Medicine

## 2016-10-19 DIAGNOSIS — R05 Cough: Secondary | ICD-10-CM | POA: Diagnosis not present

## 2016-10-19 DIAGNOSIS — R059 Cough, unspecified: Secondary | ICD-10-CM

## 2016-10-19 DIAGNOSIS — J209 Acute bronchitis, unspecified: Secondary | ICD-10-CM

## 2016-10-19 MED ORDER — CEPHALEXIN 500 MG PO CAPS: 500.0000 mg | ORAL_CAPSULE | Freq: Two times a day (BID) | ORAL | 0 refills | Status: DC

## 2016-10-19 MED ORDER — BENZONATATE 100 MG PO CAPS: 100.0000 mg | ORAL_CAPSULE | Freq: Three times a day (TID) | ORAL | 0 refills | Status: DC

## 2016-10-19 NOTE — ED Provider Notes (Signed)
CSN: [id number]     Arrival date & time 10/19/16  1155 History   None    Chief Complaint  Patient presents with  . Cough  . Abscess   (Consider location/radiation/quality/duration/timing/severity/associated sxs/prior Treatment) Patient c/o bump or cyst on buttocks and productive cough   The history is provided by the patient.  Cough  Cough characteristics:  Productive Sputum characteristics:  Yellow Severity:  Moderate Onset quality:  Sudden Duration:  1 week Timing:  Constant Progression:  Unchanged Chronicity:  New Smoker: no   Context: upper respiratory infection and weather changes   Relieved by:  Nothing Worsened by:  Nothing Ineffective treatments:  None tried Abscess    Past Medical History:  Diagnosis Date  . Adenomatous colon polyp 02/14/2012  . BBB (bundle branch block)    right  . Carotid stenosis    a. s/p L carotid stent 2004;  b. Carotid US (09/2014): Bilateral ICA 1-39%, left ECA >59%, normal subclavian bilaterally, occluded left vertebral >> FU 2 years  . Chronic kidney disease   . Chronic systolic CHF (congestive heart failure) (HCC)    a. ischemic CM EF 15-20%;  b. s/p AICD 05/24/04;  c. Echo 7/06: EF 30-40%, mild reduced RVSF, d. Echo 12/2015 EF 35-40%  . Diverticulosis of colon   . Dysrhythmia   . Elevated PSA   . HTN (hypertension)   . Hyperlipidemia   . ICD (implantable cardiac defibrillator) in place 12-25-2012   MDT CRTD upgrade by Dr Ladona Ridgel  . Myocardial infarction 1990  . Pericardial effusion    Echocardiogram (09/2014): EF 25% with distal anterior, distal inferior, distal lateral and apical akinesis, grade 1 diastolic dysfunction, very mild aortic stenosis (mean 7 mmHg) - this may be depressed due to low EF (2-D images suggest mild to moderate aortic stenosis), large pericardial effusion, no RA collapse  . Peripheral vascular disease (HCC)   . PVD (peripheral vascular disease) (HCC)    s/p L carotid PTCA/stent 2004  . Transient ischemic attack    . Type II diabetes mellitus (HCC)    Past Surgical History:  Procedure Laterality Date  . BI-VENTRICULAR IMPLANTABLE CARDIOVERTER DEFIBRILLATOR UPGRADE N/A 12/25/2012   Procedure: BI-VENTRICULAR IMPLANTABLE CARDIOVERTER DEFIBRILLATOR UPGRADE;  Surgeon: Marinus Maw, MD;  Location[facility] C[facility] LAB;  Service: Cardiovascular;  Laterality: N/A;  . BIV ICD UPGRADE  12/25/2012   MDT CRTD upgrade by Dr Ladona Ridgel for ischemic cardiomyopathy and worsening conduction system disease  . CARDIAC CATHETERIZATION  06/2003,  01/2004  . CARDIAC CATHETERIZATION N/A 01/13/2016   Procedure: Left Heart Cath and Coronary Angiography;  Surgeon: Corky Crafts, MD;  Location: [facility] INVASIVE CV LAB;  Service: Cardiovascular;  Laterality: N/A;  . CARDIAC DEFIBRILLATOR PLACEMENT  05/24/2004   Implantation of a MDT single-chamber defibrillator  . CAROTID STENT  09/11/2003   Percutaneous transluminal angioplasty and stent placement of the left internal carotid artery.  Marland Kitchen CATARACT EXTRACTION W/ INTRAOCULAR LENS  IMPLANT, BILATERAL Bilateral ~ 2010  . CORONARY ANGIOPLASTY WITH STENT PLACEMENT  1990   "2" (12/25/2012)  . INSERT / REPLACE / REMOVE PACEMAKER    . LEAD REVISION N/A 12/25/2012   Procedure: LEAD REVISION;  Surgeon: Marinus Maw, MD;  Location: [facility] CATH LAB;  Service: Cardiovascular;  Laterality: N/A;   Family History  Problem Relation Age of Onset  . Diabetes Mother   . Diabetes Brother   . Heart attack Neg Hx   . Stroke Neg Hx    Social History  Substance Use  Topics  . Smoking status: Former Smoker    Types: Cigarettes    Quit date: 12/27/1967  . Smokeless tobacco: Never Used  . Alcohol use No     Comment: 12/25/2012 "quit all alcohol 60 yr ago"    Review of Systems  Constitutional: Negative.   HENT: Negative.   Eyes: Negative.   Respiratory: Positive for cough.   Cardiovascular: Negative.   Gastrointestinal: Negative.   Endocrine: Negative.   Genitourinary: Negative.   Musculoskeletal: Negative.    Allergic/Immunologic: Negative.   Neurological: Negative.   Hematological: Negative.   Psychiatric/Behavioral: Negative.     Allergies  Patient has no known allergies.  Home Medications   Prior to Admission medications   Medication Sig Start Date End Date Taking? Authorizing Provider  aspirin EC 81 MG tablet Take 81 mg by mouth daily.   Yes Historical Provider, MD  atorvastatin (LIPITOR) 40 MG tablet TAKE 1 TABLET(40 MG) BY MOUTH DAILY AT 6 PM 04/14/16  Yes Axel Filler, MD  B-D ULTRAFINE III SHORT PEN 31G X 8 MM MISC USE AS DIRECTED ONCE DAILY FOR INSULIN INJECTION 02/08/16  Yes Oval Linsey, MD  carvedilol (COREG) 12.5 MG tablet Take 0.5 tablets (6.25 mg total) by mouth 2 (two) times daily. 02/11/16  Yes Evans Lance, MD  docusate sodium (COLACE) 100 MG capsule Take 1 capsule (100 mg total) by mouth every 12 (twelve) hours. 04/17/16  Yes Duffy Bruce, MD  fluorometholone (FML) 0.1 % ophthalmic suspension Place 1 drop into both eyes 2 (two) times daily.  06/23/16  Yes Historical Provider, MD  furosemide (LASIX) 40 MG tablet TAKE 1 TABLET BY MOUTH TWICE DAILY 02/09/16  Yes Peter M Martinique, MD  Insulin Glargine (LANTUS SOLOSTAR) 100 UNIT/ML Solostar Pen Inject 15 Units into the skin at bedtime. 01/28/16  Yes Norval Gable, MD  Multiple Vitamins-Minerals (CENTRUM SILVER ADULT 50+) TABS Take 1 tablet by mouth daily.    Yes Historical Provider, MD  pantoprazole (PROTONIX) 40 MG tablet TAKE 1 TABLET(40 MG) BY MOUTH DAILY 07/18/16  Yes Axel Filler, MD  polyethylene glycol [facility] / GLYCOLAX) packet Take 17 g by mouth daily as needed for severe constipation. 04/17/16  Yes Duffy Bruce, MD  senna-docusate (SENOKOT-S) 8.6-50 MG tablet Take 2 tablets by mouth daily. 05/04/16  Yes Konrad Felix, PA  tamsulosin (FLOMAX) 0.4 MG CAPS capsule Take 0.8 mg by mouth at bedtime.   Yes Historical Provider, MD  VENTOLIN HFA 108 (90 Base) MCG/ACT inhaler INHALE 2 PUFFS INTO THE LUNGS  EVERY 6 HOURS AS NEEDED FOR WHEEZING OR SHORTNESS OF BREATH 03/15/16  Yes Axel Filler, MD  ACCU-CHEK AVIVA PLUS test strip USE TO CHECK BLOOD SUGARS THREE TIMES DAILY TO FOUR TIMES DAILY 11/24/15   Axel Filler, MD  acetaminophen (TYLENOL) 500 MG tablet Take 500 mg by mouth 2 (two) times daily.     Historical Provider, MD  benzonatate (TESSALON) 100 MG capsule Take 1 capsule (100 mg total) by mouth every 8 (eight) hours. 10/19/16   Lysbeth Penner, FNP  Blood Glucose Monitoring Suppl (ACCU-CHEK AVIVA PLUS) w/Device KIT Please use 3 to 4 times daily to check blood glucose. diag code E11.9. Insulin dependent 11/23/15   Axel Filler, MD  cephALEXin (KEFLEX) 500 MG capsule Take 1 capsule (500 mg total) by mouth 2 (two) times daily. 10/19/16   Lysbeth Penner, FNP  Lancets (ACCU-CHEK MULTICLIX) lancets USE THREE TIMES DAILY TO FOUR TIMES DAILY TO CHECK BLOOD SUGAR  11/24/15   Axel Filler, MD  Lancets Misc. (ACCU-CHEK FASTCLIX LANCET) KIT Please use to check blood sugar 3 to 4 times daily. diag code E11.9. Insulin dependent 11/23/15   Axel Filler, MD   Meds Ordered and Administered this Visit  Medications - No data to display  BP (!) 90/48 (BP Location: Left Arm)   Pulse 69   Temp 98.6 F (37 C) (Oral)   Resp 20   SpO2 100%  No data found.   Physical Exam  Constitutional: He appears well-developed and well-nourished.  HENT:  Head: Normocephalic and atraumatic.  Right Ear: External ear normal.  Left Ear: External ear normal.  Mouth/Throat: Oropharynx is clear and moist.  Eyes: Conjunctivae and EOM are normal. Pupils are equal, round, and reactive to light.  Neck: Normal range of motion. Neck supple.  Cardiovascular: Normal rate, regular rhythm and normal heart sounds.   Pulmonary/Chest: Effort normal and breath sounds normal.  Skin:  Wound or abscess left upper buttocks   Nursing note and vitals reviewed.   Urgent Care Course     Procedures  (including critical care time)  Labs Review Labs Reviewed - No data to display  Imaging Review No results found.   Visual Acuity Review  Right Eye Distance:   Left Eye Distance:   Bilateral Distance:    Right Eye Near:   Left Eye Near:    Bilateral Near:         MDM   1. Acute bronchitis, unspecified organism   2. Cough    Explained the abscess has popped and drained and is healing well and therefore resolving.  Keflex '500mg'$  one po bid x 10 days #20 Tessalon Perles '100mg'$  one po tid prn #21      Lysbeth Penner, FNP 10/19/16 1337

## 2016-10-19 NOTE — ED Triage Notes (Signed)
Here for prod cough onset 2 days  Denies fevers, chills  Also c/o abscess on tailbone onset last night... Denies drainage  A&O x4... NAD

## 2016-10-22 ENCOUNTER — Telehealth: Payer: Self-pay | Admitting: Student in an Organized Health Care Education/Training Program

## 2016-10-24 ENCOUNTER — Telehealth: Payer: Self-pay | Admitting: Cardiology

## 2016-10-24 NOTE — Telephone Encounter (Signed)
Pt called and stated that he heard an alert tone from his ICD. Instructed pt to send a remote transmission w/ his home monitor. Pt verbalized understanding.

## 2016-10-24 NOTE — Telephone Encounter (Signed)
Called pt back and let him know that his transmission was received, and there were no alerts, he still had 3 years left of battery life and no episodes were transmitted. Pt voiced understanding.

## 2016-10-26 NOTE — Telephone Encounter (Signed)
Patient scheduled in first available slot with Dr. Evette Doffing on 12-11-16 @ 8:45 am.  Appt card mailed.

## 2016-11-14 ENCOUNTER — Emergency Department (HOSPITAL_COMMUNITY)
Admission: EM | Admit: 2016-11-14 | Discharge: 2016-11-14 | Disposition: A | Discharge status: Home / Self Care | Payer: Medicare HMO | Attending: Emergency Medicine | Admitting: Emergency Medicine

## 2016-11-14 ENCOUNTER — Encounter (HOSPITAL_COMMUNITY): Payer: Self-pay

## 2016-11-14 DIAGNOSIS — I2510 Atherosclerotic heart disease of native coronary artery without angina pectoris: Secondary | ICD-10-CM | POA: Diagnosis not present

## 2016-11-14 DIAGNOSIS — Z955 Presence of coronary angioplasty implant and graft: Secondary | ICD-10-CM | POA: Diagnosis not present

## 2016-11-14 DIAGNOSIS — Z87891 Personal history of nicotine dependence: Secondary | ICD-10-CM | POA: Insufficient documentation

## 2016-11-14 DIAGNOSIS — Z794 Long term (current) use of insulin: Secondary | ICD-10-CM | POA: Diagnosis not present

## 2016-11-14 DIAGNOSIS — K5641 Fecal impaction: Secondary | ICD-10-CM | POA: Diagnosis not present

## 2016-11-14 DIAGNOSIS — I252 Old myocardial infarction: Secondary | ICD-10-CM | POA: Insufficient documentation

## 2016-11-14 DIAGNOSIS — I5022 Chronic systolic (congestive) heart failure: Secondary | ICD-10-CM | POA: Insufficient documentation

## 2016-11-14 DIAGNOSIS — E1122 Type 2 diabetes mellitus with diabetic chronic kidney disease: Secondary | ICD-10-CM | POA: Insufficient documentation

## 2016-11-14 DIAGNOSIS — I130 Hypertensive heart and chronic kidney disease with heart failure and stage 1 through stage 4 chronic kidney disease, or unspecified chronic kidney disease: Secondary | ICD-10-CM | POA: Insufficient documentation

## 2016-11-14 DIAGNOSIS — Z95810 Presence of automatic (implantable) cardiac defibrillator: Secondary | ICD-10-CM | POA: Diagnosis not present

## 2016-11-14 DIAGNOSIS — K6289 Other specified diseases of anus and rectum: Secondary | ICD-10-CM | POA: Diagnosis present

## 2016-11-14 DIAGNOSIS — N183 Chronic kidney disease, stage 3 (moderate): Secondary | ICD-10-CM | POA: Diagnosis not present

## 2016-11-14 DIAGNOSIS — Z7982 Long term (current) use of aspirin: Secondary | ICD-10-CM | POA: Insufficient documentation

## 2016-11-14 DIAGNOSIS — Z8673 Personal history of transient ischemic attack (TIA), and cerebral infarction without residual deficits: Secondary | ICD-10-CM | POA: Insufficient documentation

## 2016-11-14 MED ORDER — LIDOCAINE HCL (PF) 1 % IJ SOLN
30.0000 mL | Freq: Once | INTRAMUSCULAR | Status: DC
  Filled 2016-11-14: qty 30

## 2016-11-14 MED ORDER — FLEET ENEMA 7-19 GM/118ML RE ENEM
1.0000 | ENEMA | Freq: Once | RECTAL | Status: AC
  Administered 2016-11-14: 1 via RECTAL
  Filled 2016-11-14: qty 1

## 2016-11-14 MED ORDER — SODIUM CHLORIDE 0.9 % IV BOLUS (SEPSIS): 1000.0000 mL | Freq: Once | INTRAVENOUS | Status: DC

## 2016-11-14 MED ORDER — POLYETHYLENE GLYCOL 3350 17 G PO PACK: 17.0000 g | PACK | Freq: Every day | ORAL | 0 refills | Status: DC

## 2016-11-14 NOTE — ED Provider Notes (Signed)
[facility] DEPT Provider Note   CSN: [id number] Arrival date & time: 11/14/16  0941     History   Chief Complaint Chief Complaint  Patient presents with  . Abscess    HPI [name] is a 83 y.o. male.  HPI Arrives complaining that it hurts to go to the bathroom and he thinks he might have a "spot" - points to his bottom. Pt is not a good historian. No fevers, chills, vomiting, diarrhea. No bm in 2 days. Hx of fecal impaction. Has done miralax in past but does not know what he takes. States sx moderate, hurts to sit on bottom.   Past Medical History:  Diagnosis Date  . Adenomatous colon polyp 02/14/2012  . BBB (bundle branch block)    right  . Carotid stenosis    a. s/p L carotid stent 2004;  b. Carotid US (09/2014): Bilateral ICA 1-39%, left ECA >59%, normal subclavian bilaterally, occluded left vertebral >> FU 2 years  . Chronic kidney disease   . Chronic systolic CHF (congestive heart failure) (HCC)    a. ischemic CM EF 15-20%;  b. s/p AICD 05/24/04;  c. Echo 7/06: EF 30-40%, mild reduced RVSF, d. Echo 12/2015 EF 35-40%  . Diverticulosis of colon   . Dysrhythmia   . Elevated PSA   . HTN (hypertension)   . Hyperlipidemia   . ICD (implantable cardiac defibrillator) in place 12-25-2012   MDT CRTD upgrade by Dr Lovena Le  . Myocardial infarction 1990  . Pericardial effusion    Echocardiogram (09/2014): EF 25% with distal anterior, distal inferior, distal lateral and apical akinesis, grade 1 diastolic dysfunction, very mild aortic stenosis (mean 7 mmHg) - this may be depressed due to low EF (2-D images suggest mild to moderate aortic stenosis), large pericardial effusion, no RA collapse  . Peripheral vascular disease ([place])   . PVD (peripheral vascular disease) ([place])    s/p L carotid PTCA/stent 2004  . Transient ischemic attack   . Type II diabetes mellitus [facility])     Patient Active Problem List   Diagnosis Date Noted  . Urinary retention 01/13/2016  . Healthcare  maintenance 11/22/2015  . [name] (chronic kidney disease) stage 3, GFR 30-59 ml/min 12/04/2014  . Pericardial effusion 11/27/2014  . Automatic implantable cardioverter-defibrillator in situ 01/16/2012  . Coronary atherosclerosis 12/25/2007  . Chronic systolic congestive heart failure ([place]) 12/25/2007  . Hyperlipidemia 01/02/2007  . Type 2 diabetes mellitus with peripheral vascular disease ([place]) 01/01/1989    Past Surgical History:  Procedure Laterality Date  . BI-VENTRICULAR IMPLANTABLE CARDIOVERTER DEFIBRILLATOR UPGRADE N/A 12/25/2012   Procedure: BI-VENTRICULAR IMPLANTABLE CARDIOVERTER DEFIBRILLATOR UPGRADE;  Surgeon: Evans Lance, MD;  Location: [facility] CATH LAB;  Service: Cardiovascular;  Laterality: N/A;  . BIV ICD UPGRADE  12/25/2012   MDT CRTD upgrade by Dr Lovena Le for ischemic cardiomyopathy and worsening conduction system disease  . CARDIAC CATHETERIZATION  06/2003,  01/2004  . CARDIAC CATHETERIZATION N/A 01/13/2016   Procedure: Left Heart Cath and Coronary Angiography;  Surgeon: Jettie Booze, MD;  Location: [facility] [place] CV LAB;  Service: Cardiovascular;  Laterality: N/A;  . CARDIAC DEFIBRILLATOR PLACEMENT  05/24/2004   Implantation of a MDT single-chamber defibrillator  . CAROTID STENT  09/11/2003   Percutaneous transluminal angioplasty and stent placement of the left internal carotid artery.  Marland Kitchen CATARACT EXTRACTION W/ INTRAOCULAR LENS  IMPLANT, BILATERAL Bilateral ~ 2010  . CORONARY ANGIOPLASTY WITH STENT PLACEMENT  1990   "2" (12/25/2012)  . INSERT / REPLACE /  REMOVE PACEMAKER    . LEAD REVISION N/A 12/25/2012   Procedure: LEAD REVISION;  Surgeon: Evans Lance, MD;  Location: [facility] CATH LAB;  Service: Cardiovascular;  Laterality: N/A;       Home Medications    Prior to Admission medications   Medication Sig Start Date End Date Taking? Authorizing Provider  ACCU-CHEK AVIVA PLUS test strip USE TO CHECK BLOOD SUGARS THREE TIMES DAILY TO FOUR TIMES DAILY 11/24/15   Axel Filler, MD  acetaminophen (TYLENOL) 500 MG tablet Take 500 mg by mouth 2 (two) times daily.     Historical Provider, MD  aspirin EC 81 MG tablet Take 81 mg by mouth daily.    Historical Provider, MD  atorvastatin (LIPITOR) 40 MG tablet TAKE 1 TABLET(40 MG) BY MOUTH DAILY AT 6 PM 04/14/16   Axel Filler, MD  B-D ULTRAFINE III SHORT PEN 31G X 8 MM MISC USE AS DIRECTED ONCE DAILY FOR INSULIN INJECTION 02/08/16   Oval Linsey, MD  benzonatate (TESSALON) 100 MG capsule Take 1 capsule (100 mg total) by mouth every 8 (eight) hours. 10/19/16   Lysbeth Penner, FNP  Blood Glucose Monitoring Suppl (ACCU-CHEK AVIVA PLUS) w/Device KIT Please use 3 to 4 times daily to check blood glucose. diag code E11.9. Insulin dependent 11/23/15   Axel Filler, MD  carvedilol (COREG) 12.5 MG tablet Take 0.5 tablets (6.25 mg total) by mouth 2 (two) times daily. 02/11/16   Evans Lance, MD  cephALEXin (KEFLEX) 500 MG capsule Take 1 capsule (500 mg total) by mouth 2 (two) times daily. 10/19/16   Lysbeth Penner, FNP  docusate sodium (COLACE) 100 MG capsule Take 1 capsule (100 mg total) by mouth every 12 (twelve) hours. 04/17/16   Duffy Bruce, MD  fluorometholone (FML) 0.1 % ophthalmic suspension Place 1 drop into both eyes 2 (two) times daily.  06/23/16   Historical Provider, MD  furosemide (LASIX) 40 MG tablet TAKE 1 TABLET BY MOUTH TWICE DAILY 02/09/16   Peter M Martinique, MD  Insulin Glargine (LANTUS SOLOSTAR) 100 UNIT/ML Solostar Pen Inject 15 Units into the skin at bedtime. 01/28/16   Norval Gable, MD  Lancets (ACCU-CHEK MULTICLIX) lancets USE THREE TIMES DAILY TO FOUR TIMES DAILY TO CHECK BLOOD SUGAR 11/24/15   Axel Filler, MD  Lancets Misc. (ACCU-CHEK FASTCLIX LANCET) KIT Please use to check blood sugar 3 to 4 times daily. diag code E11.9. Insulin dependent 11/23/15   Axel Filler, MD  Multiple Vitamins-Minerals (CENTRUM SILVER ADULT 50+) TABS Take 1 tablet by mouth daily.     Historical  Provider, MD  pantoprazole (PROTONIX) 40 MG tablet TAKE 1 TABLET(40 MG) BY MOUTH DAILY 10/23/16   Axel Filler, MD  polyethylene glycol [facility]) packet Take 17 g by mouth daily. 11/14/16   Karma Greaser, MD  senna-docusate (SENOKOT-S) 8.6-50 MG tablet Take 2 tablets by mouth daily. 05/04/16   Konrad Felix, PA  tamsulosin (FLOMAX) 0.4 MG CAPS capsule Take 0.8 mg by mouth at bedtime.    Historical Provider, MD  VENTOLIN HFA 108 (90 Base) MCG/ACT inhaler INHALE 2 PUFFS INTO THE LUNGS EVERY 6 HOURS AS NEEDED FOR WHEEZING OR SHORTNESS OF BREATH 03/15/16   Axel Filler, MD    Family History Family History  Problem Relation Age of Onset  . Diabetes Mother   . Diabetes Brother   . Heart attack Neg Hx   . Stroke Neg Hx     Social History Social  History  Substance Use Topics  . Smoking status: Former Smoker    Types: Cigarettes    Quit date: 12/27/1967  . Smokeless tobacco: Never Used  . Alcohol use No     Comment: 12/25/2012 "quit all alcohol 60 yr ago"     Allergies   Patient has no known allergies.   Review of Systems Review of Systems  Constitutional: Negative for fever.  Allergic/Immunologic: Negative for immunocompromised state.  All other systems reviewed and are negative.    Physical Exam Updated Vital Signs BP 114/64 (BP Location: Right Arm)   Pulse (!) 59   Temp 98.3 F (36.8 C) (Oral)   Resp 16   SpO2 100%   Physical Exam  Constitutional: He appears well-developed. No distress.  cachectic  HENT:  Head: Normocephalic and atraumatic.  Left Ear: External ear normal.  Eyes: Conjunctivae are normal. Pupils are equal, round, and reactive to light. Right eye exhibits no discharge. Left eye exhibits no discharge.  Neck: Normal range of motion. Neck supple.  Cardiovascular: Normal rate and regular rhythm.   No murmur heard. Pulmonary/Chest: Effort normal and breath sounds normal. No respiratory distress.  Abdominal: Soft. Bowel sounds are  normal. He exhibits no distension and no mass. There is no tenderness. There is no rebound and no guarding. No hernia.  Genitourinary:  Genitourinary Comments: Rectal stool in vault wo melena, gross blood, hemorrhoid Has site in sacrum that is scared from previous I&D, wo fluctuance and [place] wo cellulitis or abscess  Musculoskeletal: He exhibits no edema.  Neurological: He is alert.  Skin: Skin is warm. He is not diaphoretic.  Psychiatric: He has a normal mood and affect.  Nursing note and vitals reviewed.    ED Treatments / Results  Labs (all labs ordered are listed, but only abnormal results are displayed) Labs Reviewed - No data to display  EKG  EKG Interpretation None       Radiology No results found. EMERGENCY DEPARTMENT US SOFT TISSUE INTERPRETATION "Study: Limited Soft Tissue Ultrasound"  INDICATIONS: Pain Multiple views of the body part were obtained in real-time with a multi-frequency linear probe  PERFORMED BY: Myself IMAGES ARCHIVED?: Yes SIDE:Midline BODY PART:Lower back INTERPRETATION:  No abcess noted, No cellulitis noted and Normal soft tissue ultrasound     Procedures Procedures (including critical care time)  Medications Ordered in ED Medications  sodium phosphate (FLEET) 7-19 GM/118ML enema 1 enema (1 enema Rectal Given 11/14/16 1110)     Initial Impression / Assessment and Plan / ED Course  I have reviewed the triage vital signs and the nursing notes.  Pertinent labs & imaging results that were available during my care of the patient were reviewed by me and considered in my medical decision making (see chart for details).     Pt impacted, this is not new. However, no N/V or diarrhea or abdominal pain to suggest severe impaction, SBO. Doubt SBO or acute abdomen, megacolon. Initial bp from poor cuff fitting. Repeat wnls. No fever, tachy - not septic. No abscess on POCUS. Miralax and enema for impaction. Fu with pcp. Strict return precautions. Dc  in good condition.   Final Clinical Impressions(s) / ED Diagnoses   Final diagnoses:  Fecal impaction (HCC)    New Prescriptions New Prescriptions   POLYETHYLENE GLYCOL (MIRALAX) PACKET    Take 17 g by mouth daily.     Karma Greaser, MD 11/14/16 1114    Merrily Pew, MD 11/15/16 1500

## 2016-11-14 NOTE — ED Triage Notes (Signed)
Patient here with abscess to buttocks for 2 days, pain with sitting

## 2016-11-14 NOTE — ED Notes (Signed)
On examination the patient does not have an abcess, on rectal exam he has some hard stool

## 2016-11-14 NOTE — ED Notes (Signed)
Pt was able to go to the bathroom

## 2016-11-16 ENCOUNTER — Telehealth: Payer: Self-pay | Admitting: Cardiology

## 2016-11-16 NOTE — Telephone Encounter (Signed)
Attempted to confirm remote transmission with pt. No answer and was unable to leave a message.   

## 2016-11-20 DIAGNOSIS — B309 Viral conjunctivitis, unspecified: Secondary | ICD-10-CM | POA: Diagnosis not present

## 2016-11-20 DIAGNOSIS — Z961 Presence of intraocular lens: Secondary | ICD-10-CM | POA: Diagnosis not present

## 2016-11-27 NOTE — Progress Notes (Signed)
No ICM remote transmission received for 11/16/2016 and next ICM transmission scheduled for 12/18/2016.

## 2016-12-08 ENCOUNTER — Telehealth: Payer: Self-pay | Admitting: Student in an Organized Health Care Education/Training Program

## 2016-12-08 NOTE — Telephone Encounter (Signed)
APT. REMINDER CALL, NO ANSWER, NO VOICEMAIL °

## 2016-12-11 ENCOUNTER — Telehealth: Payer: Self-pay

## 2016-12-11 ENCOUNTER — Encounter: Payer: Self-pay | Admitting: Student in an Organized Health Care Education/Training Program

## 2016-12-11 ENCOUNTER — Ambulatory Visit (INDEPENDENT_AMBULATORY_CARE_PROVIDER_SITE_OTHER): Payer: Medicare HMO | Admitting: Student in an Organized Health Care Education/Training Program

## 2016-12-11 VITALS — BP 101/49 | HR 72 | Temp 98.1°F | Ht 68.0 in | Wt 120.4 lb

## 2016-12-11 DIAGNOSIS — Z7189 Other specified counseling: Secondary | ICD-10-CM | POA: Diagnosis not present

## 2016-12-11 DIAGNOSIS — Z794 Long term (current) use of insulin: Secondary | ICD-10-CM

## 2016-12-11 DIAGNOSIS — R634 Abnormal weight loss: Secondary | ICD-10-CM | POA: Diagnosis not present

## 2016-12-11 DIAGNOSIS — E1151 Type 2 diabetes mellitus with diabetic peripheral angiopathy without gangrene: Secondary | ICD-10-CM | POA: Diagnosis not present

## 2016-12-11 DIAGNOSIS — E43 Unspecified severe protein-calorie malnutrition: Secondary | ICD-10-CM

## 2016-12-11 DIAGNOSIS — Z7982 Long term (current) use of aspirin: Secondary | ICD-10-CM

## 2016-12-11 DIAGNOSIS — R339 Retention of urine, unspecified: Secondary | ICD-10-CM

## 2016-12-11 DIAGNOSIS — Z8674 Personal history of sudden cardiac arrest: Secondary | ICD-10-CM | POA: Diagnosis not present

## 2016-12-11 DIAGNOSIS — E1122 Type 2 diabetes mellitus with diabetic chronic kidney disease: Secondary | ICD-10-CM

## 2016-12-11 DIAGNOSIS — Z87891 Personal history of nicotine dependence: Secondary | ICD-10-CM

## 2016-12-11 DIAGNOSIS — I5022 Chronic systolic (congestive) heart failure: Secondary | ICD-10-CM | POA: Diagnosis not present

## 2016-12-11 DIAGNOSIS — N183 Chronic kidney disease, stage 3 unspecified: Secondary | ICD-10-CM

## 2016-12-11 DIAGNOSIS — K5909 Other constipation: Secondary | ICD-10-CM

## 2016-12-11 DIAGNOSIS — Z681 Body mass index (BMI) 19 or less, adult: Secondary | ICD-10-CM | POA: Diagnosis not present

## 2016-12-11 LAB — POCT GLYCOSYLATED HEMOGLOBIN (HGB A1C): HEMOGLOBIN A1C: 9.5

## 2016-12-11 LAB — GLUCOSE, CAPILLARY: Glucose-Capillary: 178 mg/dL — ABNORMAL HIGH (ref 65–99)

## 2016-12-11 NOTE — Assessment & Plan Note (Signed)
Hemoglobin A1c 9.5%. His goal is around 8% given his advanced age and comorbidities. He has struggled with hypoglycemia in the past. Plan is to increase Lantus from 15 units to 20 units nightly. Not a good candidate for metformin given weight loss and renal function. Not a good candidate for sulfonylurea given history of hypoglycemia.

## 2016-12-11 NOTE — Assessment & Plan Note (Signed)
We spent 10 minutes discussing advance care planning today. The patient is clear that he would accept every intervention necessary to keep him alive for as long as possible. Last spring he made it through a V. fib arrest requiring CPR and intubation. Even after that experience the patient says that he would go through it again if that was necessary to stay alive.

## 2016-12-11 NOTE — Assessment & Plan Note (Signed)
Patient with heart failure with reduced ejection fraction to 20%, had a severe illness last spring with V. fib arrest. On exam today he appears euvolemic, though is losing weight at an alarming rate. He follows with the heart failure clinic. I'm getting continue his current regimen of furosemide 40 mg daily, carvedilol, aspirin, and atorvastatin.

## 2016-12-11 NOTE — Progress Notes (Signed)
Assessment and Plan:  See Encounters tab for problem-based medical decision making.   __________________________________________________________  HPI:  83 year old man here for follow-up of diabetes. Patient has a complicated medical history of ischemic chronic heart failure with reduced ejection fraction to 30%. He hadn't admission in April 2017 with a V. fib arrest and survived the post arrest protocol. He spent some time at a subacute rehabilitation center. Had one more hospitalization for hypoglycemia due to insulin dosing. It has been about 10 months since she seen [place] in the internal medicine clinic. Apparently he went to visit his sister in [place] and was staying with her for many months. He has had 6 visits to the emergency department for severe constipation and fecal impaction. Currently he reports using daily MiraLAX has been very helpful. Now he says the constipation is resolved. His largest complaint today is of unintentional weight loss. He tells me that he's having difficulty with his clothes fitting him anymore. He reports having a good appetite and eating 3 meals a day. Reports good access to food. Lives in an assisted living facility and has most of his meals provided for him. Reports good compliance with his other medications. Denies any chest pain, orthopnea, or dyspnea with exertion. No presyncope. No fevers or chills or night sweats. Continues to have difficulty with urine retention and follows with [facility].  __________________________________________________________  Problem List: Patient Active Problem List   Diagnosis Date Noted  . Malnutrition ([place]) 12/11/2016    Priority: High  . Urinary retention 01/13/2016    Priority: High  . [name] (chronic kidney disease) stage 3, GFR 30-59 ml/min 12/04/2014    Priority: High  . Chronic systolic congestive heart failure ([place]) 12/25/2007    Priority: High  . Type 2 diabetes mellitus with peripheral vascular disease ([place])  01/01/1989    Priority: High  . Coronary atherosclerosis 12/25/2007    Priority: Medium  . Advanced care planning/counseling discussion 12/11/2016    Priority: Low  . Healthcare maintenance 11/22/2015    Priority: Low  . Automatic implantable cardioverter-defibrillator in situ 01/16/2012    Priority: Low  . Hyperlipidemia 01/02/2007    Priority: Low    Medications: Reconciled today in Epic __________________________________________________________  Physical Exam:  Vital Signs: Vitals:   12/11/16 0833  BP: (!) 101/49  Pulse: 72  Temp: 98.1 F (36.7 C)  TempSrc: Oral  SpO2: 98%  Weight: 120 lb 6.4 oz (54.6 kg)  Height: 5\' 8"  (1.727 m)    Gen: Chronically ill appearing, frail older man Neck: No cervical LAD, No thyromegaly or nodules, No JVD. CV: RRR, no murmurs Pulm: Normal effort, CTA throughout, no wheezing Abd: Soft, NT, ND, no organomegaly.  Ext: Warm, 1+ pitting edema bilaterally, normal joints. Skin: No atypical appearing moles. No rashes

## 2016-12-11 NOTE — Assessment & Plan Note (Signed)
Baseline GFR around 30. He is established with [facility]. Plan to check BMP today.

## 2016-12-11 NOTE — Patient Instructions (Addendum)
1. Increase your Lantus to 20 units every night.   2. I am going to try to prescribe you protein shakes to help with your weight loss.   3. I filled out your PCS form so you can get more help at home.

## 2016-12-11 NOTE — Assessment & Plan Note (Addendum)
Patient presents with a dramatic weight loss of 30 pounds  in 4 months. on exam he has diminished muscle mass. He reports good appetite and good access to food. Has 3 meals a day. Lives in an assisted living facility and reports that his meals are provided for him by [facility] [employer]. Denies fevers or night sweats. He has chronic constipation and fecal impaction recently, making colon cancer a potential source of occult malignancy. He is a long-time nonsmoker. I'm not sure what the cause of his weight losses but it does portend a poor prognosis given his advanced heart failure. Plan to check comprehensive metabolic panel today and a pre-albumin. We talked about ordering the patient nutritional supplements including ensure. Plan for CT chest/abdomen/pelvis to rule out malignancy as the cause of this weight loss.  I think he is too frail to undergo colonoscopy, potentially could do a sigmoidoscopy in the CT scan is unrevealing.

## 2016-12-12 ENCOUNTER — Encounter: Payer: Self-pay | Admitting: Student in an Organized Health Care Education/Training Program

## 2016-12-12 LAB — CMP14 + ANION GAP
ALBUMIN: 3.5 g/dL (ref 3.5–4.7)
ALT: 17 IU/L (ref 0–44)
ANION GAP: 17 mmol/L (ref 10.0–18.0)
AST: 18 IU/L (ref 0–40)
Albumin/Globulin Ratio: 1.1 — ABNORMAL LOW (ref 1.2–2.2)
Alkaline Phosphatase: 75 IU/L (ref 39–117)
BILIRUBIN TOTAL: 0.3 mg/dL (ref 0.0–1.2)
BUN/Creatinine Ratio: 20 (ref 10–24)
BUN: 33 mg/dL — AB (ref 8–27)
CALCIUM: 9.2 mg/dL (ref 8.6–10.2)
CO2: 25 mmol/L (ref 18–29)
Chloride: 96 mmol/L (ref 96–106)
Creatinine, Ser: 1.68 mg/dL — ABNORMAL HIGH (ref 0.76–1.27)
GFR calc Af Amer: 43 mL/min/{1.73_m2} — ABNORMAL LOW (ref >59)
GFR, EST NON AFRICAN AMERICAN: 37 mL/min/{1.73_m2} — AB (ref >59)
GLUCOSE: 206 mg/dL — AB (ref 65–99)
Globulin, Total: 3.3 g/dL (ref 1.5–4.5)
POTASSIUM: 4.3 mmol/L (ref 3.5–5.2)
Sodium: 138 mmol/L (ref 134–144)
TOTAL PROTEIN: 6.8 g/dL (ref 6.0–8.5)

## 2016-12-12 LAB — PREALBUMIN: PREALBUMIN: 18 mg/dL (ref 9–32)

## 2016-12-18 ENCOUNTER — Ambulatory Visit (INDEPENDENT_AMBULATORY_CARE_PROVIDER_SITE_OTHER): Payer: Medicare HMO

## 2016-12-18 DIAGNOSIS — I5022 Chronic systolic (congestive) heart failure: Secondary | ICD-10-CM | POA: Diagnosis not present

## 2016-12-18 DIAGNOSIS — Z95810 Presence of automatic (implantable) cardiac defibrillator: Secondary | ICD-10-CM

## 2016-12-18 NOTE — Progress Notes (Signed)
EPIC Encounter for ICM Monitoring  Patient Name: [name] is a 83 y.o. male Date: 12/18/2016 Primary Care Physican: Axel Filler, MD Primary Cardiologist:Taylor Electrophysiologist: Druscilla Brownie Weight:unknown Bi-V Pacing:  96%         Heart Failure questions reviewed, pt asymptomatic   Thoracic impedance normal.  Prescribed and confirmed dosage: Furosemide 40 mg 1 tablet twice a day.   Recommendations: No changes. Reminded to limit dietary salt intake to 2000 mg/day and fluid intake to < 2 liters/day. Encouraged to call for fluid symptoms.  Follow-up plan: ICM clinic phone appointment on 01/18/2017.  Copy of ICM check sent to device physician.   3 month ICM trend: 12/18/2016   1 Year ICM trend:      [name], RN 12/18/2016 2:21 PM

## 2016-12-26 ENCOUNTER — Ambulatory Visit (HOSPITAL_COMMUNITY): Admission: RE | Admit: 2016-12-26 | Payer: Medicare HMO | Source: Ambulatory Visit

## 2017-01-17 ENCOUNTER — Emergency Department (HOSPITAL_COMMUNITY)
Admission: EM | Admit: 2017-01-17 | Discharge: 2017-01-17 | Disposition: A | Discharge status: Home / Self Care | Payer: Medicare HMO | Attending: Emergency Medicine | Admitting: Emergency Medicine

## 2017-01-17 ENCOUNTER — Encounter (HOSPITAL_COMMUNITY): Payer: Self-pay

## 2017-01-17 DIAGNOSIS — Z7982 Long term (current) use of aspirin: Secondary | ICD-10-CM | POA: Diagnosis not present

## 2017-01-17 DIAGNOSIS — I252 Old myocardial infarction: Secondary | ICD-10-CM | POA: Insufficient documentation

## 2017-01-17 DIAGNOSIS — R339 Retention of urine, unspecified: Secondary | ICD-10-CM | POA: Diagnosis not present

## 2017-01-17 DIAGNOSIS — Z87891 Personal history of nicotine dependence: Secondary | ICD-10-CM | POA: Insufficient documentation

## 2017-01-17 DIAGNOSIS — N183 Chronic kidney disease, stage 3 (moderate): Secondary | ICD-10-CM | POA: Insufficient documentation

## 2017-01-17 DIAGNOSIS — M549 Dorsalgia, unspecified: Secondary | ICD-10-CM | POA: Diagnosis not present

## 2017-01-17 DIAGNOSIS — K5900 Constipation, unspecified: Secondary | ICD-10-CM | POA: Diagnosis not present

## 2017-01-17 DIAGNOSIS — N410 Acute prostatitis: Secondary | ICD-10-CM

## 2017-01-17 DIAGNOSIS — I130 Hypertensive heart and chronic kidney disease with heart failure and stage 1 through stage 4 chronic kidney disease, or unspecified chronic kidney disease: Secondary | ICD-10-CM | POA: Diagnosis not present

## 2017-01-17 DIAGNOSIS — E1122 Type 2 diabetes mellitus with diabetic chronic kidney disease: Secondary | ICD-10-CM | POA: Insufficient documentation

## 2017-01-17 DIAGNOSIS — I5022 Chronic systolic (congestive) heart failure: Secondary | ICD-10-CM | POA: Insufficient documentation

## 2017-01-17 DIAGNOSIS — R300 Dysuria: Secondary | ICD-10-CM | POA: Diagnosis present

## 2017-01-17 DIAGNOSIS — Z950 Presence of cardiac pacemaker: Secondary | ICD-10-CM | POA: Insufficient documentation

## 2017-01-17 DIAGNOSIS — Z955 Presence of coronary angioplasty implant and graft: Secondary | ICD-10-CM | POA: Diagnosis not present

## 2017-01-17 LAB — URINALYSIS, ROUTINE W REFLEX MICROSCOPIC
Bilirubin Urine: NEGATIVE
Glucose, UA: 150 mg/dL — AB
Hgb urine dipstick: NEGATIVE
Ketones, ur: NEGATIVE mg/dL
NITRITE: NEGATIVE
PH: 6 (ref 5.0–8.0)
Protein, ur: NEGATIVE mg/dL
Specific Gravity, Urine: 1.01 (ref 1.005–1.030)

## 2017-01-17 LAB — CBG MONITORING, ED: Glucose-Capillary: 275 mg/dL — ABNORMAL HIGH (ref 65–99)

## 2017-01-17 MED ORDER — POLYETHYLENE GLYCOL 3350 17 G PO PACK: 17.0000 g | PACK | Freq: Every day | ORAL | 0 refills | Status: DC

## 2017-01-17 MED ORDER — SULFAMETHOXAZOLE-TRIMETHOPRIM 800-160 MG PO TABS: 1.0000 | ORAL_TABLET | Freq: Two times a day (BID) | ORAL | 0 refills | Status: DC

## 2017-01-17 NOTE — ED Triage Notes (Signed)
Per EMS- Patient resides at [facility]. Patient c/o lower back pain, last BM 5 days ago, and burning when urinating.

## 2017-01-17 NOTE — ED Notes (Signed)
Bed: NU27 Expected date:  Expected time:  Means of arrival:  Comments: EMS/back pain/constipation

## 2017-01-17 NOTE — ED Notes (Signed)
PT[facility] called for transportation to [facility] at [address] [place], [place]

## 2017-01-17 NOTE — ED Provider Notes (Signed)
[facility] DEPT Provider Note   CSN: [id number] Arrival date & time: 01/17/17  6759     History   Chief Complaint Chief Complaint  Patient presents with  . Back Pain  . Constipation  . Dysuria    HPI [name] is a 83 y.o. male.  HPI Patient presents with constipation and some rectal pain some dysuria. No abdominal pain. No fevers. States that it burned when he had to urinate. Also burned when he had to "sing sing". No fevers or chills. No nausea vomiting. No penile discharge. Does have a history of constipation. Has a pacemaker but is not on anticoagulation. Pain is dull.   Past Medical History:  Diagnosis Date  . Adenomatous colon polyp 02/14/2012  . BBB (bundle branch block)    right  . Carotid stenosis    a. s/p L carotid stent 2004;  b. Carotid US (09/2014): Bilateral ICA 1-39%, left ECA >59%, normal subclavian bilaterally, occluded left vertebral >> FU 2 years  . Chronic kidney disease   . Chronic systolic CHF (congestive heart failure) (HCC)    a. ischemic CM EF 15-20%;  b. s/p AICD 05/24/04;  c. Echo 7/06: EF 30-40%, mild reduced RVSF, d. Echo 12/2015 EF 35-40%  . Diverticulosis of colon   . Dysrhythmia   . Elevated PSA   . HTN (hypertension)   . Hyperlipidemia   . ICD (implantable cardiac defibrillator) in place 12-25-2012   MDT CRTD upgrade by Dr Lovena Le  . Myocardial infarction ([place]) 1990  . Pericardial effusion    Echocardiogram (09/2014): EF 25% with distal anterior, distal inferior, distal lateral and apical akinesis, grade 1 diastolic dysfunction, very mild aortic stenosis (mean 7 mmHg) - this may be depressed due to low EF (2-D images suggest mild to moderate aortic stenosis), large pericardial effusion, no RA collapse  . Peripheral vascular disease ([place])   . PVD (peripheral vascular disease) ([place])    s/p L carotid PTCA/stent 2004  . Transient ischemic attack   . Type II diabetes mellitus [facility])     Patient Active Problem List   Diagnosis Date  Noted  . Weight loss 12/11/2016  . Advanced care planning/counseling discussion 12/11/2016  . Urinary retention 01/13/2016  . Healthcare maintenance 11/22/2015  . [name] (chronic kidney disease) stage 3, GFR 30-59 ml/min 12/04/2014  . Automatic implantable cardioverter-defibrillator in situ 01/16/2012  . Coronary atherosclerosis 12/25/2007  . Chronic systolic congestive heart failure ([place]) 12/25/2007  . Hyperlipidemia 01/02/2007  . Type 2 diabetes mellitus with peripheral vascular disease ([place]) 01/01/1989    Past Surgical History:  Procedure Laterality Date  . BI-VENTRICULAR IMPLANTABLE CARDIOVERTER DEFIBRILLATOR UPGRADE N/A 12/25/2012   Procedure: BI-VENTRICULAR IMPLANTABLE CARDIOVERTER DEFIBRILLATOR UPGRADE;  Surgeon: Evans Lance, MD;  Location: [facility] CATH LAB;  Service: Cardiovascular;  Laterality: N/A;  . BIV ICD UPGRADE  12/25/2012   MDT CRTD upgrade by Dr Lovena Le for ischemic cardiomyopathy and worsening conduction system disease  . CARDIAC CATHETERIZATION  06/2003,  01/2004  . CARDIAC CATHETERIZATION N/A 01/13/2016   Procedure: Left Heart Cath and Coronary Angiography;  Surgeon: Jettie Booze, MD;  Location: [facility] CV LAB;  Service: Cardiovascular;  Laterality: N/A;  . CARDIAC DEFIBRILLATOR PLACEMENT  05/24/2004   Implantation of a MDT single-chamber defibrillator  . CAROTID STENT  09/11/2003   Percutaneous transluminal angioplasty and stent placement of the left internal carotid artery.  Marland Kitchen CATARACT EXTRACTION W/ INTRAOCULAR LENS  IMPLANT, BILATERAL Bilateral ~ 2010  . CORONARY ANGIOPLASTY WITH STENT PLACEMENT  1990   "2" (12/25/2012)  . INSERT / REPLACE / REMOVE PACEMAKER    . LEAD REVISION N/A 12/25/2012   Procedure: LEAD REVISION;  Surgeon: Evans Lance, MD;  Location: [facility] CATH LAB;  Service: Cardiovascular;  Laterality: N/A;       Home Medications    Prior to Admission medications   Medication Sig Start Date End Date Taking? Authorizing Provider  aspirin EC 81 MG  tablet Take 81 mg by mouth daily.   Yes Historical Provider, MD  atorvastatin (LIPITOR) 40 MG tablet TAKE 1 TABLET(40 MG) BY MOUTH DAILY AT 6 PM 04/14/16  Yes Axel Filler, MD  carvedilol (COREG) 12.5 MG tablet Take 0.5 tablets (6.25 mg total) by mouth 2 (two) times daily. Patient taking differently: Take 12.5 mg by mouth 2 (two) times daily.  02/11/16  Yes Evans Lance, MD  fluorometholone (FML) 0.1 % ophthalmic suspension Place 2 drops into both eyes daily.  06/23/16  Yes Historical Provider, MD  furosemide (LASIX) 40 MG tablet TAKE 1 TABLET BY MOUTH TWICE DAILY 02/09/16  Yes Peter M Martinique, MD  Insulin Glargine (LANTUS SOLOSTAR) 100 UNIT/ML Solostar Pen Inject 15 Units into the skin at bedtime. Patient taking differently: Inject 20 Units into the skin at bedtime.  01/28/16  Yes Norval Gable, MD  lactose free nutrition (BOOST) LIQD Take 237 mLs by mouth daily.   Yes Historical Provider, MD  pantoprazole (PROTONIX) 40 MG tablet TAKE 1 TABLET(40 MG) BY MOUTH DAILY 10/23/16  Yes Axel Filler, MD  tamsulosin (FLOMAX) 0.4 MG CAPS capsule Take 0.8 mg by mouth at bedtime.   Yes Historical Provider, MD  VENTOLIN HFA 108 (90 Base) MCG/ACT inhaler INHALE 2 PUFFS INTO THE LUNGS EVERY 6 HOURS AS NEEDED FOR WHEEZING OR SHORTNESS OF BREATH 03/15/16  Yes Axel Filler, MD  ACCU-CHEK AVIVA PLUS test strip USE TO CHECK BLOOD SUGARS THREE TIMES DAILY TO FOUR TIMES DAILY 11/24/15   Axel Filler, MD  B-D ULTRAFINE III SHORT PEN 31G X 8 MM MISC USE AS DIRECTED ONCE DAILY FOR INSULIN INJECTION 02/08/16   Oval Linsey, MD  Blood Glucose Monitoring Suppl (ACCU-CHEK AVIVA PLUS) w/Device KIT Please use 3 to 4 times daily to check blood glucose. diag code E11.9. Insulin dependent 11/23/15   Axel Filler, MD  Lancets (ACCU-CHEK MULTICLIX) lancets USE THREE TIMES DAILY TO FOUR TIMES DAILY TO CHECK BLOOD SUGAR 11/24/15   Axel Filler, MD  polyethylene glycol [facility]) packet Take  17 g by mouth daily. 01/17/17   Davonna Belling, MD  senna-docusate (SENOKOT-S) 8.6-50 MG tablet Take 2 tablets by mouth daily. Patient not taking: Reported on 01/17/2017 05/04/16   Konrad Felix, PA  sulfamethoxazole-trimethoprim (BACTRIM DS,SEPTRA DS) 800-160 MG tablet Take 1 tablet by mouth 2 (two) times daily. 01/17/17   Davonna Belling, MD    Family History Family History  Problem Relation Age of Onset  . Diabetes Mother   . Diabetes Brother   . Heart attack Neg Hx   . Stroke Neg Hx     Social History Social History  Substance Use Topics  . Smoking status: Former Smoker    Types: Cigarettes    Quit date: 12/27/1967  . Smokeless tobacco: Never Used  . Alcohol use No     Comment: 12/25/2012 "quit all alcohol 60 yr ago"     Allergies   Patient has no known allergies.   Review of Systems Review of Systems  Constitutional: Negative for  appetite change.  HENT: Negative for congestion.   Respiratory: Negative for shortness of breath.   Cardiovascular: Negative for chest pain.  Gastrointestinal: Positive for constipation and rectal pain. Negative for abdominal pain and vomiting.  Genitourinary: Positive for dysuria.  Musculoskeletal: Negative for back pain.  Neurological: Negative for syncope.  Hematological: Negative for adenopathy.  Psychiatric/Behavioral: Negative for confusion.     Physical Exam Updated Vital Signs BP (!) 133/56   Pulse 68   Temp 97.4 F (36.3 C)   Resp 16   Ht _0  (1.778 m)   Wt 160 lb (72.6 kg)   SpO2 100%   BMI 22.96 kg/m   Physical Exam  Constitutional: He appears well-developed.  HENT:  Head: Atraumatic.  Eyes: Pupils are equal, round, and reactive to light.  Neck: Neck supple.  Cardiovascular: Normal rate.   Pulmonary/Chest: Effort normal.  Abdominal: There is no tenderness.  Musculoskeletal: He exhibits no edema.  Neurological: He is alert.  Skin: Skin is warm. Capillary refill takes less than 2 seconds.  Psychiatric: He  has a normal mood and affect.  Rectal exam showed stool without fecal impaction. There was some anterior tenderness potentially prostatitis.   ED Treatments / Results  Labs (all labs ordered are listed, but only abnormal results are displayed) Labs Reviewed  URINALYSIS, ROUTINE W REFLEX MICROSCOPIC - Abnormal; Notable for the following:       Result Value   APPearance HAZY (*)    Glucose, UA 150 (*)    Leukocytes, UA LARGE (*)    Bacteria, UA MANY (*)    Squamous Epithelial / LPF 0-5 (*)    All other components within normal limits  CBG MONITORING, ED - Abnormal; Notable for the following:    Glucose-Capillary 275 (*)    All other components within normal limits  URINE CULTURE    EKG  EKG Interpretation None       Radiology No results found.  Procedures Procedures (including critical care time)  Medications Ordered in ED Medications - No data to display   Initial Impression / Assessment and Plan / ED Course  I have reviewed the triage vital signs and the nursing notes.  Pertinent labs & imaging results that were available during my care of the patient were reviewed by me and considered in my medical decision making (see chart for details).     Patient with dysuria. Also some constipation. 9 abdominal exam. Sugar mildly elevated. Urine shows infection. With anterior pain on rectal exam will treat as prostatitis. We'll also give him relax. Discharge home.  Final Clinical Impressions(s) / ED Diagnoses   Final diagnoses:  Acute prostatitis  Constipation, unspecified constipation type    New Prescriptions Discharge Medication List as of 01/17/2017  2:05 PM    START taking these medications   Details  sulfamethoxazole-trimethoprim (BACTRIM DS,SEPTRA DS) 800-160 MG tablet Take 1 tablet by mouth 2 (two) times daily., Starting Wed 01/17/2017, Print         Davonna Belling, MD 01/17/17 [phone]

## 2017-01-18 ENCOUNTER — Ambulatory Visit (INDEPENDENT_AMBULATORY_CARE_PROVIDER_SITE_OTHER): Payer: Medicare HMO

## 2017-01-18 DIAGNOSIS — Z95810 Presence of automatic (implantable) cardiac defibrillator: Secondary | ICD-10-CM

## 2017-01-18 DIAGNOSIS — I5022 Chronic systolic (congestive) heart failure: Secondary | ICD-10-CM

## 2017-01-18 NOTE — Progress Notes (Signed)
EPIC Encounter for ICM Monitoring  Patient Name: [name] is a 83 y.o. male Date: 01/18/2017 Primary Care Physican: Axel Filler, MD Primary Cardiologist:Taylor Electrophysiologist: Druscilla Brownie Weight:unknown Bi-V Pacing: 96%                Heart Failure questions reviewed, pt asymptomatic.  Patient had ER visit on 01/17/2017.    Thoracic impedance abnormal suggesting fluid accumulation .  Prescribed dosage: Furosemide 40 mg 1 tablet twice a day.   Labs: 12/11/2016 Creatinine 1.68, BUN 33, Potassium 4.3, Sodium 138, EGFR 37-43 04/17/2016 Creatinine 2.08, BUN 37, Potassium 4.1, Sodium 135, EGFR 28-32   Recommendations: No changes. Follow-up plan: ICM clinic phone appointment on 01/25/2017 to recheck fluid levels.    Copy of ICM check sent to Dr Lovena Le for review and if any recommendations will call him back.    3 month ICM trend: 01/18/2017   1 Year ICM trend:      [name], RN 01/18/2017 1:05 PM

## 2017-01-19 LAB — URINE CULTURE: Culture: >=100000 — AB

## 2017-01-19 NOTE — Telephone Encounter (Signed)
error 

## 2017-01-20 ENCOUNTER — Telehealth: Payer: Self-pay

## 2017-01-20 NOTE — Telephone Encounter (Signed)
Post ED Visit - Positive Culture Follow-up  Culture report reviewed by antimicrobial stewardship pharmacist:  []  Elenor Quinones, Pharm.D. []  Heide Guile, Pharm.D., BCPS AQ-ID []  Parks Neptune, Pharm.D., BCPS []  Alycia Rossetti, Pharm.D., BCPS []  [place], Pharm.D., BCPS, AAHIVP []  Legrand Como, Pharm.D., BCPS, AAHIVP []  Salome Arnt, PharmD, BCPS [x]  Dimitri Ped, PharmD, BCPS []  Vincenza Hews, PharmD, BCPS  Positive urine culture Treated with Sulfamethoxazole-Trimethoprim, organism sensitive to the same and no further patient follow-up is required at this time.  Genia Del 01/20/2017, 10:24 AM

## 2017-01-25 ENCOUNTER — Ambulatory Visit (INDEPENDENT_AMBULATORY_CARE_PROVIDER_SITE_OTHER): Payer: Medicare HMO

## 2017-01-25 DIAGNOSIS — Z95810 Presence of automatic (implantable) cardiac defibrillator: Secondary | ICD-10-CM

## 2017-01-25 DIAGNOSIS — I5022 Chronic systolic (congestive) heart failure: Secondary | ICD-10-CM

## 2017-01-26 ENCOUNTER — Telehealth: Payer: Self-pay

## 2017-01-26 NOTE — Progress Notes (Signed)
EPIC Encounter for ICM Monitoring  Patient Name: [name] is a 83 y.o. male Date: 01/26/2017 Primary Care Physican: Axel Filler, MD Primary Cardiologist:Taylor Electrophysiologist: Druscilla Brownie Weight:unknown Bi-V Pacing: 99.7%       Attempted call to patient and unable to reach.   Transmission reviewed.    Thoracic impedance returned to normal.  Prescribed dosage: Furosemide 40 mg 1 tablet twice a day.   Labs: 12/11/2016 Creatinine 1.68, BUN 33, Potassium 4.3, Sodium 138, EGFR 37-43 04/17/2016 Creatinine 2.08, BUN 37, Potassium 4.1, Sodium 135, EGFR 28-32   Recommendations: NONE - Unable to reach patient   Follow-up plan: ICM clinic phone appointment on 02/20/2017.    Copy of ICM check sent to device physician.   3 month ICM trend: 01/25/2017   1 Year ICM trend:      [name], RN 01/26/2017 9:35 AM

## 2017-01-26 NOTE — Telephone Encounter (Signed)
Remote ICM transmission received.  Attempted patient call and no answer 

## 2017-02-03 ENCOUNTER — Other Ambulatory Visit: Payer: Self-pay | Admitting: Physician Assistant

## 2017-02-06 ENCOUNTER — Encounter (HOSPITAL_COMMUNITY): Payer: Self-pay | Admitting: Nurse Practitioner

## 2017-02-06 ENCOUNTER — Emergency Department (HOSPITAL_COMMUNITY)
Admission: EM | Admit: 2017-02-06 | Discharge: 2017-02-06 | Disposition: A | Discharge status: Home / Self Care | Payer: Medicare HMO | Attending: Emergency Medicine | Admitting: Emergency Medicine

## 2017-02-06 DIAGNOSIS — I130 Hypertensive heart and chronic kidney disease with heart failure and stage 1 through stage 4 chronic kidney disease, or unspecified chronic kidney disease: Secondary | ICD-10-CM | POA: Diagnosis not present

## 2017-02-06 DIAGNOSIS — Z7982 Long term (current) use of aspirin: Secondary | ICD-10-CM | POA: Diagnosis not present

## 2017-02-06 DIAGNOSIS — N183 Chronic kidney disease, stage 3 (moderate): Secondary | ICD-10-CM | POA: Insufficient documentation

## 2017-02-06 DIAGNOSIS — K5641 Fecal impaction: Secondary | ICD-10-CM | POA: Insufficient documentation

## 2017-02-06 DIAGNOSIS — Z79899 Other long term (current) drug therapy: Secondary | ICD-10-CM | POA: Diagnosis not present

## 2017-02-06 DIAGNOSIS — I252 Old myocardial infarction: Secondary | ICD-10-CM | POA: Diagnosis not present

## 2017-02-06 DIAGNOSIS — K6289 Other specified diseases of anus and rectum: Secondary | ICD-10-CM | POA: Diagnosis present

## 2017-02-06 DIAGNOSIS — Z87891 Personal history of nicotine dependence: Secondary | ICD-10-CM | POA: Diagnosis not present

## 2017-02-06 DIAGNOSIS — E1122 Type 2 diabetes mellitus with diabetic chronic kidney disease: Secondary | ICD-10-CM | POA: Diagnosis not present

## 2017-02-06 DIAGNOSIS — K5900 Constipation, unspecified: Secondary | ICD-10-CM | POA: Diagnosis not present

## 2017-02-06 DIAGNOSIS — Z95810 Presence of automatic (implantable) cardiac defibrillator: Secondary | ICD-10-CM | POA: Diagnosis not present

## 2017-02-06 DIAGNOSIS — I5022 Chronic systolic (congestive) heart failure: Secondary | ICD-10-CM | POA: Diagnosis not present

## 2017-02-06 DIAGNOSIS — Z794 Long term (current) use of insulin: Secondary | ICD-10-CM | POA: Diagnosis not present

## 2017-02-06 LAB — POC OCCULT BLOOD, ED: FECAL OCCULT BLD: NEGATIVE

## 2017-02-06 MED ORDER — POLYETHYLENE GLYCOL 3350 17 G PO PACK: 17.0000 g | PACK | Freq: Every day | ORAL | 0 refills | Status: DC

## 2017-02-06 MED ORDER — DOCUSATE SODIUM 100 MG PO CAPS: 100.0000 mg | ORAL_CAPSULE | Freq: Two times a day (BID) | ORAL | 0 refills | Status: DC

## 2017-02-06 MED ORDER — SORBITOL 70 % SOLN
960.0000 mL | TOPICAL_OIL | Freq: Once | ORAL | Status: AC
  Administered 2017-02-06: 960 mL via RECTAL
  Filled 2017-02-06: qty 240

## 2017-02-06 MED ORDER — TAMSULOSIN HCL 0.4 MG PO CAPS: 0.4000 mg | ORAL_CAPSULE | Freq: Every day | ORAL | 0 refills | Status: DC

## 2017-02-06 MED ORDER — ATORVASTATIN CALCIUM 40 MG PO TABS: 40.0000 mg | ORAL_TABLET | Freq: Every day | ORAL | 0 refills | Status: DC

## 2017-02-06 NOTE — ED Notes (Signed)
Enema completed.  At end of enema small amount brown colored watery return.  MD at bedside and digital stimulation done, moderate amount formed stool returned.  Pt sitting on bedside commode.

## 2017-02-06 NOTE — ED Provider Notes (Signed)
[facility] DEPT Provider Note   CSN: [id number] Arrival date & time: 02/06/17  1235     History   Chief Complaint Chief Complaint  Patient presents with  . Rectal Pain    HPI [name] is a 83 y.o. male.  HPI Patient reports he gets rectal pain when he has to poop. He reports that he has to strain really hard and that it hurts a lot in his anal region. He reports only very small amounts of stool come out at a time. He tries taking MiraLAX daily. He denies abdominal pain. He denies vomiting. Constipation has been an ongoing problem. No fever or chills. He denies pain burning or urgency with urination. Past Medical History:  Diagnosis Date  . Adenomatous colon polyp 02/14/2012  . BBB (bundle branch block)    right  . Carotid stenosis    a. s/p L carotid stent 2004;  b. Carotid US (09/2014): Bilateral ICA 1-39%, left ECA >59%, normal subclavian bilaterally, occluded left vertebral >> FU 2 years  . Chronic kidney disease   . Chronic systolic CHF (congestive heart failure) (HCC)    a. ischemic CM EF 15-20%;  b. s/p AICD 05/24/04;  c. Echo 7/06: EF 30-40%, mild reduced RVSF, d. Echo 12/2015 EF 35-40%  . Diverticulosis of colon   . Dysrhythmia   . Elevated PSA   . HTN (hypertension)   . Hyperlipidemia   . ICD (implantable cardiac defibrillator) in place 12-25-2012   MDT CRTD upgrade by Dr Lovena Le  . Myocardial infarction ([place]) 1990  . Pericardial effusion    Echocardiogram (09/2014): EF 25% with distal anterior, distal inferior, distal lateral and apical akinesis, grade 1 diastolic dysfunction, very mild aortic stenosis (mean 7 mmHg) - this may be depressed due to low EF (2-D images suggest mild to moderate aortic stenosis), large pericardial effusion, no RA collapse  . Peripheral vascular disease ([place])   . PVD (peripheral vascular disease) ([place])    s/p L carotid PTCA/stent 2004  . Transient ischemic attack   . Type II diabetes mellitus [facility])     Patient Active Problem  List   Diagnosis Date Noted  . Weight loss 12/11/2016  . Advanced care planning/counseling discussion 12/11/2016  . Urinary retention 01/13/2016  . Healthcare maintenance 11/22/2015  . [name] (chronic kidney disease) stage 3, GFR 30-59 ml/min 12/04/2014  . Automatic implantable cardioverter-defibrillator in situ 01/16/2012  . Coronary atherosclerosis 12/25/2007  . Chronic systolic congestive heart failure ([place]) 12/25/2007  . Hyperlipidemia 01/02/2007  . Type 2 diabetes mellitus with peripheral vascular disease ([place]) 01/01/1989    Past Surgical History:  Procedure Laterality Date  . BI-VENTRICULAR IMPLANTABLE CARDIOVERTER DEFIBRILLATOR UPGRADE N/A 12/25/2012   Procedure: BI-VENTRICULAR IMPLANTABLE CARDIOVERTER DEFIBRILLATOR UPGRADE;  Surgeon: Evans Lance, MD;  Location[facility] CATH LAB;  Service: Cardiovascular;  Laterality: N/A;  . BIV ICD UPGRADE  12/25/2012   MDT CRTD upgrade by Dr Lovena Le for ischemic cardiomyopathy and worsening conduction system disease  . CARDIAC CATHETERIZATION  06/2003,  01/2004  . CARDIAC CATHETERIZATION N/A 01/13/2016   Procedure: Left Heart Cath and Coronary Angiography;  Surgeon: Jettie Booze, MD;  Location: [facility] CV LAB;  Service: Cardiovascular;  Laterality: N/A;  . CARDIAC DEFIBRILLATOR PLACEMENT  05/24/2004   Implantation of a MDT single-chamber defibrillator  . CAROTID STENT  09/11/2003   Percutaneous transluminal angioplasty and stent placement of the left internal carotid artery.  Marland Kitchen CATARACT EXTRACTION W/ INTRAOCULAR LENS  IMPLANT, BILATERAL Bilateral ~ 2010  .  CORONARY ANGIOPLASTY WITH STENT PLACEMENT  1990   "2" (12/25/2012)  . INSERT / REPLACE / REMOVE PACEMAKER    . LEAD REVISION N/A 12/25/2012   Procedure: LEAD REVISION;  Surgeon: Evans Lance, MD;  Location: [facility] CATH LAB;  Service: Cardiovascular;  Laterality: N/A;       Home Medications    Prior to Admission medications   Medication Sig Start Date End Date Taking? Authorizing Provider    ACCU-CHEK AVIVA PLUS test strip USE TO CHECK BLOOD SUGARS THREE TIMES DAILY TO FOUR TIMES DAILY 11/24/15  Yes Axel Filler, MD  aspirin EC 81 MG tablet Take 81 mg by mouth daily.   Yes [provider]  atorvastatin (LIPITOR) 40 MG tablet TAKE 1 TABLET(40 MG) BY MOUTH DAILY AT 6 PM 04/14/16  Yes Axel Filler, MD  B-D ULTRAFINE III SHORT PEN 31G X 8 MM MISC USE AS DIRECTED ONCE DAILY FOR INSULIN INJECTION 02/08/16  Yes Oval Linsey, MD  Blood Glucose Monitoring Suppl (ACCU-CHEK AVIVA PLUS) w/Device KIT Please use 3 to 4 times daily to check blood glucose. diag code E11.9. Insulin dependent 11/23/15  Yes Axel Filler, MD  carvedilol (COREG) 12.5 MG tablet TAKE 1 TABLET(12.5 MG) BY MOUTH TWICE DAILY 02/05/17  Yes Barrett, Evelene Croon, PA-C  fluorometholone (FML) 0.1 % ophthalmic suspension Place 2 drops into both eyes daily.  06/23/16  Yes [provider]  furosemide (LASIX) 40 MG tablet TAKE 1 TABLET BY MOUTH TWICE DAILY 02/09/16  Yes Martinique, Peter M, MD  Insulin Glargine (LANTUS SOLOSTAR) 100 UNIT/ML Solostar Pen Inject 15 Units into the skin at bedtime. Patient taking differently: Inject 20 Units into the skin at bedtime.  01/28/16  Yes Norval Gable, MD  lactose free nutrition (BOOST) LIQD Take 237 mLs by mouth daily.   Yes [provider]  Lancets (ACCU-CHEK MULTICLIX) lancets USE THREE TIMES DAILY TO FOUR TIMES DAILY TO CHECK BLOOD SUGAR 11/24/15  Yes Axel Filler, MD  pantoprazole (PROTONIX) 40 MG tablet TAKE 1 TABLET(40 MG) BY MOUTH DAILY 10/23/16  Yes Axel Filler, MD  polyethylene glycol [facility]) packet Take 17 g by mouth daily. 01/17/17  Yes Davonna Belling, MD  senna-docusate (SENOKOT-S) 8.6-50 MG tablet Take 2 tablets by mouth daily. 05/04/16  Yes Konrad Felix, PA  tamsulosin (FLOMAX) 0.4 MG CAPS capsule Take 0.8 mg by mouth at bedtime.   Yes [provider]  VENTOLIN HFA 108 (90 Base) MCG/ACT inhaler INHALE  2 PUFFS INTO THE LUNGS EVERY 6 HOURS AS NEEDED FOR WHEEZING OR SHORTNESS OF BREATH 03/15/16  Yes Axel Filler, MD  atorvastatin (LIPITOR) 40 MG tablet Take 1 tablet (40 mg total) by mouth daily. 02/06/17   Charlesetta Shanks, MD  docusate sodium (COLACE) 100 MG capsule Take 1 capsule (100 mg total) by mouth every 12 (twelve) hours. 02/06/17   Charlesetta Shanks, MD  polyethylene glycol (MIRALAX / GLYCOLAX) packet Take 17 g by mouth daily. 02/06/17   Charlesetta Shanks, MD  sulfamethoxazole-trimethoprim (BACTRIM DS,SEPTRA DS) 800-160 MG tablet Take 1 tablet by mouth 2 (two) times daily. Patient not taking: Reported on 02/06/2017 01/17/17   Davonna Belling, MD  tamsulosin (FLOMAX) 0.4 MG CAPS capsule Take 1 capsule (0.4 mg total) by mouth daily. 02/06/17   Charlesetta Shanks, MD    Family History Family History  Problem Relation Age of Onset  . Diabetes Mother   . Diabetes Brother   . Heart attack Neg Hx   . Stroke Neg  Hx     Social History Social History  Substance Use Topics  . Smoking status: Former Smoker    Types: Cigarettes    Quit date: 12/27/1967  . Smokeless tobacco: Never Used  . Alcohol use No     Comment: 12/25/2012 "quit all alcohol 60 yr ago"     Allergies   Patient has no known allergies.   Review of Systems Review of Systems 10 Systems reviewed and are negative for acute change except as noted in the HPI.   Physical Exam Updated Vital Signs BP (!) 98/55   Pulse (!) 58   Temp 97.6 F (36.4 C) (Oral)   Resp 18   SpO2 100%   Physical Exam  Constitutional: He is oriented to person, place, and time.  Patient is alert and nontoxic. No respiratory distress. Mental status clear. Very thin.  HENT:  Head: Normocephalic and atraumatic.  Mouth/Throat: Oropharynx is clear and moist.  Eyes: EOM are normal.  Cardiovascular: Normal rate, regular rhythm, normal heart sounds and intact distal pulses.   Distant heart sounds.  Pulmonary/Chest: Effort normal and breath sounds  normal.  Abdominal: Soft. He exhibits no distension. There is no tenderness. There is no guarding.  Genitourinary:  Genitourinary Comments: Digital rectal exam reveals large stool impaction. Stool is brown, no blood no melena.  Musculoskeletal: Normal range of motion.  Neurological: He is alert and oriented to person, place, and time. No cranial nerve deficit. He exhibits normal muscle tone. Coordination normal.  Skin: Skin is warm and dry.  Psychiatric: He has a normal mood and affect.     ED Treatments / Results  Labs (all labs ordered are listed, but only abnormal results are displayed) Labs Reviewed  POC OCCULT BLOOD, ED    EKG  EKG Interpretation None       Radiology No results found.  Procedures Procedures (including critical care time) Patient manually  disimpacted of large amount of firm stool after administration of SMOG enema Medications Ordered in ED Medications  sorbitol, milk of mag, mineral oil, glycerin (SMOG) enema (960 mLs Rectal Given 02/06/17 2208)     Initial Impression / Assessment and Plan / ED Course  I have reviewed the triage vital signs and the nursing notes.  Pertinent labs & imaging results that were available during my care of the patient were reviewed by me and considered in my medical decision making (see chart for details).     Final Clinical Impressions(s) / ED Diagnoses   Final diagnoses:  Fecal impaction (HCC)  Constipation, unspecified constipation type  After disimpaction, patient very large bowel movement and felt very much improved. Patient was advised to take daily Colace and daily MiraLAX for the next week. He may then resume daily Colace and MiraLAX every third day.  New Prescriptions New Prescriptions   ATORVASTATIN (LIPITOR) 40 MG TABLET    Take 1 tablet (40 mg total) by mouth daily.   DOCUSATE SODIUM (COLACE) 100 MG CAPSULE    Take 1 capsule (100 mg total) by mouth every 12 (twelve) hours.   POLYETHYLENE GLYCOL  (MIRALAX / GLYCOLAX) PACKET    Take 17 g by mouth daily.   TAMSULOSIN (FLOMAX) 0.4 MG CAPS CAPSULE    Take 1 capsule (0.4 mg total) by mouth daily.     Charlesetta Shanks, MD 02/06/17 2302

## 2017-02-06 NOTE — ED Notes (Signed)
Pt passed large amount of stool.  States "I feel much better now, I need to call a cab to come take me home now".  Pt is getting dressed, MD made aware, will discharge pt.

## 2017-02-06 NOTE — Discharge Instructions (Signed)
Take Colace twice every day. Take MiraLAX every day for the next 5 days. Once he had a large bowel movement, take the MiraLAX every third day if there is been no bowel movement all taking Colace.

## 2017-02-06 NOTE — ED Triage Notes (Signed)
Pt presents with c/o rectal pain. The pain began last night while he was having a bowel movement. He reports the pain has persisted since and is worse every time he tries to have a bowel movement. He is taking a stool softener but continues to feel he has to strain to have a bowel movement.

## 2017-02-20 ENCOUNTER — Telehealth: Payer: Self-pay

## 2017-02-20 ENCOUNTER — Ambulatory Visit (INDEPENDENT_AMBULATORY_CARE_PROVIDER_SITE_OTHER): Payer: Medicare HMO

## 2017-02-20 DIAGNOSIS — I5022 Chronic systolic (congestive) heart failure: Secondary | ICD-10-CM | POA: Diagnosis not present

## 2017-02-20 DIAGNOSIS — Z95810 Presence of automatic (implantable) cardiac defibrillator: Secondary | ICD-10-CM | POA: Diagnosis not present

## 2017-02-20 NOTE — Telephone Encounter (Signed)
Remote ICM transmission received.  Attempted patient call and left detailed message regarding transmission and next ICM scheduled for 03/23/2017.  Advised to return call for any fluid symptoms or questions.

## 2017-02-20 NOTE — Progress Notes (Signed)
EPIC Encounter for ICM Monitoring  Patient Name: [name] is a 83 y.o. male Date: 02/20/2017 Primary Care Physican: Axel Filler, MD Primary Cardiologist:Taylor Electrophysiologist: Druscilla Brownie Weight:unknown Bi-V Pacing: 97.4%            Attempted call to patient and unable to reach.  Left detailed message regarding transmission.  Transmission reviewed.    Thoracic impedance close to baseline.   Prescribed dosage: Furosemide 40 mg 1 tablet twice a day.   Labs: 12/11/2016 Creatinine 1.68, BUN 33, Potassium 4.3, Sodium 138, EGFR 37-43 04/17/2016 Creatinine 2.08, BUN 37, Potassium 4.1, Sodium 135, EGFR 28-32   Recommendations: Left voice mail with ICM number and encouraged to call for fluid symptoms.  Follow-up plan: ICM clinic phone appointment on 03/23/2017.  Patient was due to make an office appt to see Dr Lovena Le in April.  Will advise him at next contact.    Copy of ICM check sent to device physician.   3 month ICM trend: 02/20/2017   1 Year ICM trend:      [name], RN 02/20/2017 10:49 AM

## 2017-02-26 ENCOUNTER — Other Ambulatory Visit: Payer: Self-pay | Admitting: Student in an Organized Health Care Education/Training Program

## 2017-03-05 ENCOUNTER — Other Ambulatory Visit: Payer: Self-pay | Admitting: Student in an Organized Health Care Education/Training Program

## 2017-03-19 ENCOUNTER — Ambulatory Visit: Payer: Medicare HMO | Admitting: Student in an Organized Health Care Education/Training Program

## 2017-03-19 ENCOUNTER — Encounter: Payer: Self-pay | Admitting: Student in an Organized Health Care Education/Training Program

## 2017-03-23 ENCOUNTER — Ambulatory Visit (INDEPENDENT_AMBULATORY_CARE_PROVIDER_SITE_OTHER): Payer: Medicare HMO | Admitting: *Deleted

## 2017-03-23 ENCOUNTER — Telehealth: Payer: Self-pay

## 2017-03-23 DIAGNOSIS — Z95810 Presence of automatic (implantable) cardiac defibrillator: Secondary | ICD-10-CM

## 2017-03-23 DIAGNOSIS — I472 Ventricular tachycardia, unspecified: Secondary | ICD-10-CM

## 2017-03-23 DIAGNOSIS — I5022 Chronic systolic (congestive) heart failure: Secondary | ICD-10-CM

## 2017-03-23 NOTE — Telephone Encounter (Signed)
Remote ICM transmission received.  Attempted patient call and no message

## 2017-03-23 NOTE — Progress Notes (Signed)
Remote ICD transmission.   

## 2017-03-23 NOTE — Progress Notes (Signed)
EPIC Encounter for ICM Monitoring  Patient Name: [name] is a 83 y.o. male Date: 03/23/2017 Primary Care Physican: Axel Filler, MD Primary Cardiologist:Taylor Electrophysiologist: Druscilla Brownie Weight:unknown Bi-V Pacing: 95.1%      Attempted call to patient and unable to reach.    Transmission reviewed.    Thoracic impedance normal.  Prescribed dosage: Furosemide 40 mg 1 tablet twice a day.   Labs: 12/11/2016 Creatinine 1.68, BUN 33, Potassium 4.3, Sodium 138, EGFR 37-43 04/17/2016 Creatinine 2.08, BUN 37, Potassium 4.1, Sodium 135, EGFR 28-32  Recommendations: NONE - Unable to reach patient   Follow-up plan: ICM clinic phone appointment on 04/23/2017.   Copy of ICM check sent to device physician.   3 month ICM trend: 03/23/2017   1 Year ICM trend:      [name], RN 03/23/2017 11:04 AM

## 2017-03-27 ENCOUNTER — Encounter: Payer: Self-pay | Admitting: Cardiology

## 2017-04-09 LAB — CUP PACEART REMOTE DEVICE CHECK
Battery Voltage: 2.96 V
Brady Statistic AP VP Percent: 33.18 %
Brady Statistic AS VP Percent: 64.39 %
Brady Statistic RA Percent Paced: 32.64 %
Date Time Interrogation Session: 20180629083727
HIGH POWER IMPEDANCE MEASURED VALUE: 53 Ohm
Implantable Lead Implant Date: 20140402
Implantable Lead Location: 753859
Implantable Lead Location: 753860
Implantable Lead Model: 4194
Implantable Lead Model: 5076
Lead Channel Impedance Value: 228 Ohm
Lead Channel Impedance Value: 399 Ohm
Lead Channel Impedance Value: 418 Ohm
Lead Channel Impedance Value: 551 Ohm
Lead Channel Pacing Threshold Amplitude: 0.625 V
Lead Channel Pacing Threshold Amplitude: 0.625 V
Lead Channel Pacing Threshold Pulse Width: 0.4 ms
Lead Channel Pacing Threshold Pulse Width: 0.6 ms
Lead Channel Sensing Intrinsic Amplitude: 2.625 mV
Lead Channel Setting Pacing Amplitude: 2 V
Lead Channel Setting Pacing Amplitude: 2.5 V
Lead Channel Setting Pacing Pulse Width: 0.4 ms
Lead Channel Setting Sensing Sensitivity: 0.3 mV
MDC IDC LEAD IMPLANT DT: 20140402
MDC IDC LEAD IMPLANT DT: 20140402
MDC IDC LEAD LOCATION: 753858
MDC IDC MSMT BATTERY REMAINING LONGEVITY: 37 mo
MDC IDC MSMT LEADCHNL LV PACING THRESHOLD AMPLITUDE: 1.125 V
MDC IDC MSMT LEADCHNL RA IMPEDANCE VALUE: 399 Ohm
MDC IDC MSMT LEADCHNL RA SENSING INTR AMPL: 2.625 mV
MDC IDC MSMT LEADCHNL RV IMPEDANCE VALUE: 475 Ohm
MDC IDC MSMT LEADCHNL RV PACING THRESHOLD PULSEWIDTH: 0.4 ms
MDC IDC MSMT LEADCHNL RV SENSING INTR AMPL: 16 mV
MDC IDC MSMT LEADCHNL RV SENSING INTR AMPL: 16 mV
MDC IDC PG IMPLANT DT: 20140402
MDC IDC SET LEADCHNL LV PACING AMPLITUDE: 1.75 V
MDC IDC SET LEADCHNL LV PACING PULSEWIDTH: 0.6 ms
MDC IDC STAT BRADY AP VS PERCENT: 0.37 %
MDC IDC STAT BRADY AS VS PERCENT: 2.07 %
MDC IDC STAT BRADY RV PERCENT PACED: 95.11 %

## 2017-04-10 ENCOUNTER — Other Ambulatory Visit: Payer: Self-pay | Admitting: Internal Medicine

## 2017-04-10 MED ORDER — FUROSEMIDE 40 MG PO TABS: 40.0000 mg | ORAL_TABLET | Freq: Two times a day (BID) | ORAL | 0 refills | Status: DC

## 2017-04-16 DIAGNOSIS — Z961 Presence of intraocular lens: Secondary | ICD-10-CM | POA: Diagnosis not present

## 2017-04-16 DIAGNOSIS — B309 Viral conjunctivitis, unspecified: Secondary | ICD-10-CM | POA: Diagnosis not present

## 2017-04-21 ENCOUNTER — Emergency Department (HOSPITAL_COMMUNITY): Payer: Medicare HMO

## 2017-04-21 ENCOUNTER — Inpatient Hospital Stay (HOSPITAL_COMMUNITY)
Admission: EM | Admit: 2017-04-21 | Discharge: 2017-04-25 | DRG: 872 | Disposition: A | Discharge status: Skilled Nursing Facility | Payer: Medicare HMO | Attending: Internal Medicine | Admitting: Internal Medicine

## 2017-04-21 DIAGNOSIS — I951 Orthostatic hypotension: Secondary | ICD-10-CM | POA: Diagnosis present

## 2017-04-21 DIAGNOSIS — J439 Emphysema, unspecified: Secondary | ICD-10-CM | POA: Diagnosis not present

## 2017-04-21 DIAGNOSIS — R739 Hyperglycemia, unspecified: Secondary | ICD-10-CM

## 2017-04-21 DIAGNOSIS — E119 Type 2 diabetes mellitus without complications: Secondary | ICD-10-CM | POA: Diagnosis not present

## 2017-04-21 DIAGNOSIS — I4510 Unspecified right bundle-branch block: Secondary | ICD-10-CM | POA: Diagnosis present

## 2017-04-21 DIAGNOSIS — Z87898 Personal history of other specified conditions: Secondary | ICD-10-CM

## 2017-04-21 DIAGNOSIS — R531 Weakness: Secondary | ICD-10-CM | POA: Diagnosis not present

## 2017-04-21 DIAGNOSIS — R64 Cachexia: Secondary | ICD-10-CM | POA: Diagnosis present

## 2017-04-21 DIAGNOSIS — E785 Hyperlipidemia, unspecified: Secondary | ICD-10-CM | POA: Diagnosis not present

## 2017-04-21 DIAGNOSIS — I2510 Atherosclerotic heart disease of native coronary artery without angina pectoris: Secondary | ICD-10-CM | POA: Diagnosis present

## 2017-04-21 DIAGNOSIS — W08XXXA Fall from other furniture, initial encounter: Secondary | ICD-10-CM | POA: Diagnosis present

## 2017-04-21 DIAGNOSIS — Z7982 Long term (current) use of aspirin: Secondary | ICD-10-CM | POA: Diagnosis not present

## 2017-04-21 DIAGNOSIS — R748 Abnormal levels of other serum enzymes: Secondary | ICD-10-CM | POA: Diagnosis present

## 2017-04-21 DIAGNOSIS — I6523 Occlusion and stenosis of bilateral carotid arteries: Secondary | ICD-10-CM | POA: Diagnosis present

## 2017-04-21 DIAGNOSIS — Z681 Body mass index (BMI) 19 or less, adult: Secondary | ICD-10-CM | POA: Diagnosis not present

## 2017-04-21 DIAGNOSIS — E871 Hypo-osmolality and hyponatremia: Secondary | ICD-10-CM | POA: Diagnosis not present

## 2017-04-21 DIAGNOSIS — N183 Chronic kidney disease, stage 3 (moderate): Secondary | ICD-10-CM | POA: Diagnosis not present

## 2017-04-21 DIAGNOSIS — E1151 Type 2 diabetes mellitus with diabetic peripheral angiopathy without gangrene: Secondary | ICD-10-CM | POA: Diagnosis present

## 2017-04-21 DIAGNOSIS — I130 Hypertensive heart and chronic kidney disease with heart failure and stage 1 through stage 4 chronic kidney disease, or unspecified chronic kidney disease: Secondary | ICD-10-CM | POA: Diagnosis present

## 2017-04-21 DIAGNOSIS — E1165 Type 2 diabetes mellitus with hyperglycemia: Secondary | ICD-10-CM | POA: Diagnosis present

## 2017-04-21 DIAGNOSIS — I252 Old myocardial infarction: Secondary | ICD-10-CM | POA: Diagnosis not present

## 2017-04-21 DIAGNOSIS — N179 Acute kidney failure, unspecified: Secondary | ICD-10-CM | POA: Diagnosis present

## 2017-04-21 DIAGNOSIS — N390 Urinary tract infection, site not specified: Secondary | ICD-10-CM | POA: Diagnosis not present

## 2017-04-21 DIAGNOSIS — R2689 Other abnormalities of gait and mobility: Secondary | ICD-10-CM | POA: Diagnosis not present

## 2017-04-21 DIAGNOSIS — I255 Ischemic cardiomyopathy: Secondary | ICD-10-CM | POA: Diagnosis present

## 2017-04-21 DIAGNOSIS — Z794 Long term (current) use of insulin: Secondary | ICD-10-CM | POA: Diagnosis not present

## 2017-04-21 DIAGNOSIS — L98491 Non-pressure chronic ulcer of skin of other sites limited to breakdown of skin: Secondary | ICD-10-CM | POA: Diagnosis not present

## 2017-04-21 DIAGNOSIS — I10 Essential (primary) hypertension: Secondary | ICD-10-CM | POA: Diagnosis not present

## 2017-04-21 DIAGNOSIS — E1122 Type 2 diabetes mellitus with diabetic chronic kidney disease: Secondary | ICD-10-CM | POA: Diagnosis not present

## 2017-04-21 DIAGNOSIS — E11649 Type 2 diabetes mellitus with hypoglycemia without coma: Secondary | ICD-10-CM | POA: Diagnosis not present

## 2017-04-21 DIAGNOSIS — Z95810 Presence of automatic (implantable) cardiac defibrillator: Secondary | ICD-10-CM

## 2017-04-21 DIAGNOSIS — L8990 Pressure ulcer of unspecified site, unspecified stage: Secondary | ICD-10-CM | POA: Insufficient documentation

## 2017-04-21 DIAGNOSIS — R634 Abnormal weight loss: Secondary | ICD-10-CM | POA: Diagnosis not present

## 2017-04-21 DIAGNOSIS — L89159 Pressure ulcer of sacral region, unspecified stage: Secondary | ICD-10-CM

## 2017-04-21 DIAGNOSIS — M6281 Muscle weakness (generalized): Secondary | ICD-10-CM | POA: Diagnosis not present

## 2017-04-21 DIAGNOSIS — R7989 Other specified abnormal findings of blood chemistry: Secondary | ICD-10-CM

## 2017-04-21 DIAGNOSIS — K219 Gastro-esophageal reflux disease without esophagitis: Secondary | ICD-10-CM | POA: Diagnosis not present

## 2017-04-21 DIAGNOSIS — I509 Heart failure, unspecified: Secondary | ICD-10-CM | POA: Diagnosis not present

## 2017-04-21 DIAGNOSIS — Z87891 Personal history of nicotine dependence: Secondary | ICD-10-CM | POA: Diagnosis not present

## 2017-04-21 DIAGNOSIS — R7881 Bacteremia: Secondary | ICD-10-CM | POA: Diagnosis present

## 2017-04-21 DIAGNOSIS — L89152 Pressure ulcer of sacral region, stage 2: Secondary | ICD-10-CM | POA: Diagnosis present

## 2017-04-21 DIAGNOSIS — Z8673 Personal history of transient ischemic attack (TIA), and cerebral infarction without residual deficits: Secondary | ICD-10-CM | POA: Diagnosis not present

## 2017-04-21 DIAGNOSIS — B9620 Unspecified Escherichia coli [E. coli] as the cause of diseases classified elsewhere: Secondary | ICD-10-CM | POA: Diagnosis not present

## 2017-04-21 DIAGNOSIS — R339 Retention of urine, unspecified: Secondary | ICD-10-CM | POA: Diagnosis not present

## 2017-04-21 DIAGNOSIS — I5022 Chronic systolic (congestive) heart failure: Secondary | ICD-10-CM | POA: Diagnosis not present

## 2017-04-21 DIAGNOSIS — A419 Sepsis, unspecified organism: Principal | ICD-10-CM | POA: Diagnosis present

## 2017-04-21 DIAGNOSIS — R509 Fever, unspecified: Secondary | ICD-10-CM | POA: Diagnosis not present

## 2017-04-21 DIAGNOSIS — B999 Unspecified infectious disease: Secondary | ICD-10-CM | POA: Diagnosis not present

## 2017-04-21 DIAGNOSIS — S0990XA Unspecified injury of head, initial encounter: Secondary | ICD-10-CM | POA: Diagnosis not present

## 2017-04-21 DIAGNOSIS — E108 Type 1 diabetes mellitus with unspecified complications: Secondary | ICD-10-CM | POA: Diagnosis not present

## 2017-04-21 DIAGNOSIS — E11622 Type 2 diabetes mellitus with other skin ulcer: Secondary | ICD-10-CM | POA: Diagnosis not present

## 2017-04-21 LAB — URINALYSIS, ROUTINE W REFLEX MICROSCOPIC
Bilirubin Urine: NEGATIVE
GLUCOSE, UA: NEGATIVE mg/dL
Ketones, ur: 15 mg/dL — AB
NITRITE: NEGATIVE
PH: 5.5 (ref 5.0–8.0)
Protein, ur: 100 mg/dL — AB
SPECIFIC GRAVITY, URINE: 1.025 (ref 1.005–1.030)

## 2017-04-21 LAB — CBC
HEMATOCRIT: 38.9 % — AB (ref 39.0–52.0)
HEMOGLOBIN: 13.1 g/dL (ref 13.0–17.0)
MCH: 29.6 pg (ref 26.0–34.0)
MCHC: 33.7 g/dL (ref 30.0–36.0)
MCV: 88 fL (ref 78.0–100.0)
Platelets: 144 10*3/uL — ABNORMAL LOW (ref 150–400)
RBC: 4.42 MIL/uL (ref 4.22–5.81)
RDW: 13.3 % (ref 11.5–15.5)
WBC: 17.3 10*3/uL — AB (ref 4.0–10.5)

## 2017-04-21 LAB — COMPREHENSIVE METABOLIC PANEL
ALK PHOS: 71 U/L (ref 38–126)
ALT: 22 U/L (ref 17–63)
AST: 24 U/L (ref 15–41)
Albumin: 3.1 g/dL — ABNORMAL LOW (ref 3.5–5.0)
Anion gap: 10 (ref 5–15)
BUN: 51 mg/dL — AB (ref 6–20)
CALCIUM: 9.1 mg/dL (ref 8.9–10.3)
CHLORIDE: 92 mmol/L — AB (ref 101–111)
CO2: 28 mmol/L (ref 22–32)
CREATININE: 2.75 mg/dL — AB (ref 0.61–1.24)
GFR, EST AFRICAN AMERICAN: 23 mL/min — AB (ref >60)
GFR, EST NON AFRICAN AMERICAN: 20 mL/min — AB (ref >60)
Glucose, Bld: 332 mg/dL — ABNORMAL HIGH (ref 65–99)
Potassium: 4 mmol/L (ref 3.5–5.1)
Sodium: 130 mmol/L — ABNORMAL LOW (ref 135–145)
Total Bilirubin: 0.7 mg/dL (ref 0.3–1.2)
Total Protein: 7.2 g/dL (ref 6.5–8.1)

## 2017-04-21 LAB — APTT: APTT: 39 s — AB (ref 24–36)

## 2017-04-21 LAB — I-STAT TROPONIN, ED: Troponin i, poc: 0.1 ng/mL (ref 0.00–0.08)

## 2017-04-21 LAB — I-STAT CG4 LACTIC ACID, ED: Lactic Acid, Venous: 2.17 mmol/L (ref 0.5–1.9)

## 2017-04-21 LAB — PROTIME-INR
INR: 1.42
Prothrombin Time: 17.5 seconds — ABNORMAL HIGH (ref 11.4–15.2)

## 2017-04-21 LAB — DIFFERENTIAL
BASOS ABS: 0 10*3/uL (ref 0.0–0.1)
Basophils Relative: 0 %
Eosinophils Absolute: 0 10*3/uL (ref 0.0–0.7)
Eosinophils Relative: 0 %
LYMPHS ABS: 0.6 10*3/uL — AB (ref 0.7–4.0)
Lymphocytes Relative: 4 %
MONOS PCT: 9 %
Monocytes Absolute: 1.6 10*3/uL — ABNORMAL HIGH (ref 0.1–1.0)
NEUTROS ABS: 15.1 10*3/uL — AB (ref 1.7–7.7)
Neutrophils Relative %: 87 %

## 2017-04-21 LAB — URINALYSIS, MICROSCOPIC (REFLEX)

## 2017-04-21 MED ORDER — DEXTROSE 5 % IV SOLN
1.0000 g | Freq: Once | INTRAVENOUS | Status: AC
  Administered 2017-04-21: 1 g via INTRAVENOUS
  Filled 2017-04-21: qty 10

## 2017-04-21 MED ORDER — SODIUM CHLORIDE 0.9 % IV SOLN: 1000.0000 mL | INTRAVENOUS | Status: DC

## 2017-04-21 MED ORDER — SODIUM CHLORIDE 0.9 % IV BOLUS (SEPSIS)
1000.0000 mL | Freq: Once | INTRAVENOUS | Status: AC
  Administered 2017-04-21: 1000 mL via INTRAVENOUS

## 2017-04-21 MED ORDER — SODIUM CHLORIDE 0.9 % IV BOLUS (SEPSIS)
250.0000 mL | Freq: Once | INTRAVENOUS | Status: AC
  Administered 2017-04-21: 250 mL via INTRAVENOUS

## 2017-04-21 MED ORDER — SODIUM CHLORIDE 0.9 % IV BOLUS (SEPSIS): 1000.0000 mL | Freq: Once | INTRAVENOUS | Status: DC

## 2017-04-21 NOTE — ED Notes (Signed)
ED Provider at bedside. 

## 2017-04-21 NOTE — ED Triage Notes (Signed)
Patient arrived with EMS from home lost his balance and fell this evening , denies pain or injury , CBG= 353 - he has not taken his medications/insulin this evening , he has a low grade temp. T=100.4 by EMS .

## 2017-04-21 NOTE — ED Provider Notes (Signed)
[facility] DEPT Provider Note   CSN: [id number] Arrival date & time: 04/21/17  2025     History   Chief Complaint Chief Complaint  Patient presents with  . Fall    Gen. Weakness    HPI [name] is a 83 y.o. male.  HPI Patient presents to the emergency room for evaluation of weakness. She states when he woke up this morning he went to stand and his legs felt weak and gave way on him. A family member helped him onto his couch. Patient states he has been on the couch sitting there most the day today. He continues to feel weak in his legs. He denies any trouble with any fevers or chills. No vomiting or diarrhea. No dysuria. No headache or trouble with his speech. Past Medical History:  Diagnosis Date  . Adenomatous colon polyp 02/14/2012  . BBB (bundle branch block)    right  . Carotid stenosis    a. s/p L carotid stent 2004;  b. Carotid US (09/2014): Bilateral ICA 1-39%, left ECA >59%, normal subclavian bilaterally, occluded left vertebral >> FU 2 years  . Chronic kidney disease   . Chronic systolic CHF (congestive heart failure) (HCC)    a. ischemic CM EF 15-20%;  b. s/p AICD 05/24/04;  c. Echo 7/06: EF 30-40%, mild reduced RVSF, d. Echo 12/2015 EF 35-40%  . Diverticulosis of colon   . Dysrhythmia   . Elevated PSA   . HTN (hypertension)   . Hyperlipidemia   . ICD (implantable cardiac defibrillator) in place 12-25-2012   MDT CRTD upgrade by Dr Lovena Le  . Myocardial infarction ([place]) 1990  . Pericardial effusion    Echocardiogram (09/2014): EF 25% with distal anterior, distal inferior, distal lateral and apical akinesis, grade 1 diastolic dysfunction, very mild aortic stenosis (mean 7 mmHg) - this may be depressed due to low EF (2-D images suggest mild to moderate aortic stenosis), large pericardial effusion, no RA collapse  . Peripheral vascular disease ([place])   . PVD (peripheral vascular disease) ([place])    s/p L carotid PTCA/stent 2004  . Transient ischemic attack   .  Type II diabetes mellitus [facility])     Patient Active Problem List   Diagnosis Date Noted  . Weight loss 12/11/2016  . Advanced care planning/counseling discussion 12/11/2016  . Urinary retention 01/13/2016  . Healthcare maintenance 11/22/2015  . [name] (chronic kidney disease) stage 3, GFR 30-59 ml/min 12/04/2014  . Automatic implantable cardioverter-defibrillator in situ 01/16/2012  . Coronary atherosclerosis 12/25/2007  . Chronic systolic congestive heart failure ([place]) 12/25/2007  . Hyperlipidemia 01/02/2007  . Type 2 diabetes mellitus with peripheral vascular disease ([place]) 01/01/1989    Past Surgical History:  Procedure Laterality Date  . BI-VENTRICULAR IMPLANTABLE CARDIOVERTER DEFIBRILLATOR UPGRADE N/A 12/25/2012   Procedure: BI-VENTRICULAR IMPLANTABLE CARDIOVERTER DEFIBRILLATOR UPGRADE;  Surgeon: Evans Lance, MD;  Location: [facility] CATH LAB;  Service: Cardiovascular;  Laterality: N/A;  . BIV ICD UPGRADE  12/25/2012   MDT CRTD upgrade by Dr Lovena Le for ischemic cardiomyopathy and worsening conduction system disease  . CARDIAC CATHETERIZATION  06/2003,  01/2004  . CARDIAC CATHETERIZATION N/A 01/13/2016   Procedure: Left Heart Cath and Coronary Angiography;  Surgeon: Jettie Booze, MD;  Location: [facility] CV LAB;  Service: Cardiovascular;  Laterality: N/A;  . CARDIAC DEFIBRILLATOR PLACEMENT  05/24/2004   Implantation of a MDT single-chamber defibrillator  . CAROTID STENT  09/11/2003   Percutaneous transluminal angioplasty and stent placement of the left internal carotid artery.  Marland Kitchen  CATARACT EXTRACTION W/ INTRAOCULAR LENS  IMPLANT, BILATERAL Bilateral ~ 2010  . CORONARY ANGIOPLASTY WITH STENT PLACEMENT  1990   "2" (12/25/2012)  . INSERT / REPLACE / REMOVE PACEMAKER    . LEAD REVISION N/A 12/25/2012   Procedure: LEAD REVISION;  Surgeon: Evans Lance, MD;  Location: [facility] CATH LAB;  Service: Cardiovascular;  Laterality: N/A;       Home Medications    Prior to Admission medications     Medication Sig Start Date End Date Taking? Authorizing Provider  aspirin EC 81 MG tablet Take 81 mg by mouth daily.   Yes [provider]  atorvastatin (LIPITOR) 40 MG tablet TAKE 1 TABLET(40 MG) BY MOUTH DAILY AT 6 PM 04/14/16  Yes Axel Filler, MD  carvedilol (COREG) 12.5 MG tablet TAKE 1 TABLET(12.5 MG) BY MOUTH TWICE DAILY 02/05/17  Yes Barrett, Evelene Croon, PA-C  docusate sodium (COLACE) 100 MG capsule Take 1 capsule (100 mg total) by mouth every 12 (twelve) hours. 02/06/17  Yes Pfeiffer, Jeannie Done, MD  fluorometholone (FML) 0.1 % ophthalmic suspension Place 2 drops into both eyes daily.  06/23/16  Yes [provider]  furosemide (LASIX) 40 MG tablet Take 1 tablet (40 mg total) by mouth 2 (two) times daily. 04/10/17  Yes Evans Lance, MD  Insulin Glargine (LANTUS SOLOSTAR) 100 UNIT/ML Solostar Pen Inject 15 Units into the skin at bedtime. Patient taking differently: Inject 20 Units into the skin at bedtime.  01/28/16  Yes Norval Gable, MD  lactose free nutrition (BOOST) LIQD Take 237 mLs by mouth daily.   Yes [provider]  pantoprazole (PROTONIX) 40 MG tablet TAKE 1 TABLET(40 MG) BY MOUTH DAILY 02/26/17  Yes Axel Filler, MD  polyethylene glycol [name] / GLYCOLAX) packet Take 17 g by mouth daily. 02/06/17  Yes Pfeiffer, Jeannie Done, MD  senna-docusate (SENOKOT-S) 8.6-50 MG tablet Take 2 tablets by mouth daily. 05/04/16  Yes Konrad Felix, PA  tamsulosin (FLOMAX) 0.4 MG CAPS capsule Take 1 capsule (0.4 mg total) by mouth daily. Patient taking differently: Take 0.4 mg by mouth daily after supper.  02/06/17  Yes Charlesetta Shanks, MD  VENTOLIN HFA 108 (90 Base) MCG/ACT inhaler INHALE 2 PUFFS INTO THE LUNGS EVERY 6 HOURS AS NEEDED FOR WHEEZING OR SHORTNESS OF BREATH 03/15/16  Yes Axel Filler, MD  ACCU-CHEK AVIVA PLUS test strip USE TO CHECK BLOOD SUGARS THREE TIMES DAILY TO FOUR TIMES DAILY Patient not taking: Reported on 04/21/2017 11/24/15   Axel Filler, MD  polyethylene glycol [facility]) packet Take 17 g by mouth daily. Patient not taking: Reported on 04/21/2017 01/17/17   Davonna Belling, MD    Family History Family History  Problem Relation Age of Onset  . Diabetes Mother   . Diabetes Brother   . Heart attack Neg Hx   . Stroke Neg Hx     Social History Social History  Substance Use Topics  . Smoking status: Former Smoker    Types: Cigarettes    Quit date: 12/27/1967  . Smokeless tobacco: Never Used  . Alcohol use No     Comment: 12/25/2012 "quit all alcohol 60 yr ago"     Allergies   Patient has no known allergies.   Review of Systems Review of Systems  All other systems reviewed and are negative.    Physical Exam Updated Vital Signs BP (!) 130/54   Pulse 74   Temp 100.2 F (37.9 C)   Resp 15   Ht  1.778 m (5\' 10" )   SpO2 99%   Physical Exam  Constitutional: He is oriented to person, place, and time. He appears well-developed and well-nourished. No distress.  Elderly  HENT:  Head: Normocephalic and atraumatic.  Right Ear: External ear normal.  Left Ear: External ear normal.  Mouth/Throat: Oropharynx is clear and moist.  Eyes: Conjunctivae are normal. Right eye exhibits no discharge. Left eye exhibits no discharge. No scleral icterus.  Neck: Neck supple. No tracheal deviation present.  Cardiovascular: Normal rate, regular rhythm and intact distal pulses.   Pulmonary/Chest: Effort normal and breath sounds normal. No stridor. No respiratory distress. He has no wheezes. He has no rales.  Abdominal: Soft. Bowel sounds are normal. He exhibits no distension. There is no tenderness. There is no rebound and no guarding.  Musculoskeletal: He exhibits no edema or tenderness.  Neurological: He is alert and oriented to person, place, and time. No cranial nerve deficit (no facial droop, extraocular movements intact, no slurred speech) or sensory deficit. He exhibits normal muscle tone. He displays no seizure  activity. Coordination normal.  No pronator drift bilateral upper extrem, able to hold both legs off bed for 5 seconds, sensation intact in all extremities, no visual field cuts, no left or right sided neglect, normal finger-nose exam bilaterally, no nystagmus noted; generalized weakness   Skin: Skin is warm and dry. No rash noted.  Psychiatric: He has a normal mood and affect.  Nursing note and vitals reviewed.    ED Treatments / Results  Labs (all labs ordered are listed, but only abnormal results are displayed) Labs Reviewed  PROTIME-INR - Abnormal; Notable for the following:       Result Value   Prothrombin Time 17.5 (*)    All other components within normal limits  APTT - Abnormal; Notable for the following:    aPTT 39 (*)    All other components within normal limits  CBC - Abnormal; Notable for the following:    WBC 17.3 (*)    HCT 38.9 (*)    Platelets 144 (*)    All other components within normal limits  DIFFERENTIAL - Abnormal; Notable for the following:    Neutro Abs 15.1 (*)    Lymphs Abs 0.6 (*)    Monocytes Absolute 1.6 (*)    All other components within normal limits  COMPREHENSIVE METABOLIC PANEL - Abnormal; Notable for the following:    Sodium 130 (*)    Chloride 92 (*)    Glucose, Bld 332 (*)    BUN 51 (*)    Creatinine, Ser 2.75 (*)    Albumin 3.1 (*)    GFR calc non Af Amer 20 (*)    GFR calc Af Amer 23 (*)    All other components within normal limits  URINALYSIS, ROUTINE W REFLEX MICROSCOPIC - Abnormal; Notable for the following:    APPearance TURBID (*)    Hgb urine dipstick LARGE (*)    Ketones, ur 15 (*)    Protein, ur 100 (*)    Leukocytes, UA MODERATE (*)    All other components within normal limits  URINALYSIS, MICROSCOPIC (REFLEX) - Abnormal; Notable for the following:    Bacteria, UA MANY (*)    Squamous Epithelial / LPF 6-30 (*)    All other components within normal limits  I-STAT TROPONIN, ED - Abnormal; Notable for the following:     Troponin i, poc 0.10 (*)    All other components within normal limits  I-STAT CG4  LACTIC ACID, ED - Abnormal; Notable for the following:    Lactic Acid, Venous 2.17 (*)    All other components within normal limits  CULTURE, BLOOD (ROUTINE X 2)  CULTURE, BLOOD (ROUTINE X 2)  URINE CULTURE    EKG  EKG Interpretation  Date/Time:  Saturday April 21 2017 20:32:10 EDT Ventricular Rate:  79 PR Interval:    QRS Duration: 179 QT Interval:  447 QTC Calculation: 513 R Axis:   -102 Text Interpretation:  Sinus rhythm Right bundle branch block No significant change since last tracing Confirmed by Dorie Rank [phone]) on 04/21/2017 8:46:27 PM       Radiology Ct Head Wo Contrast  Result Date: 04/21/2017 CLINICAL DATA:  Status post fall. EXAM: CT HEAD WITHOUT CONTRAST TECHNIQUE: Contiguous axial images were obtained from the base of the skull through the vertex without intravenous contrast. COMPARISON:  04/14/2015 FINDINGS: Brain: No evidence of acute infarction, hemorrhage, hydrocephalus, extra-axial collection or mass lesion/mass effect. Moderate brain parenchymal volume loss and chronic microvascular white matter ischemic changes. Chronic hyperdense appearance of the dura, which likely represents dural calcifications. Subtle subarachnoid hemorrhage would be difficult to exclude in this setting. Vascular: Calcific atherosclerotic disease at the skullbase. Skull: Normal. Negative for fracture or focal lesion. Sinuses/Orbits: No acute finding. Other: None. IMPRESSION: No acute intracranial abnormality. Moderate brain parenchymal atrophy and chronic microvascular disease. Chronic hyperdense appearance of the dura, which likely represents dural calcifications. Subtle subarachnoid hemorrhage would be difficult to exclude in this setting. Electronically Signed   By: Fidela Salisbury M.D.   On: 04/21/2017 21:26    Procedures .Critical Care Performed by: Dorie Rank Authorized by: Dorie Rank   Critical  care provider statement:    Critical care time (minutes):  30   Critical care was time spent personally by me on the following activities:  Discussions with consultants, evaluation of patient's response to treatment, examination of patient, ordering and performing treatments and interventions, ordering and review of laboratory studies, ordering and review of radiographic studies, pulse oximetry, re-evaluation of patient's condition, obtaining history from patient or surrogate and review of old charts   (including critical care time)  Medications Ordered in ED Medications  cefTRIAXone (ROCEPHIN) 1 g in dextrose 5 % 50 mL IVPB (1 g Intravenous New Bag/Given 04/21/17 2319)  sodium chloride 0.9 % bolus 1,000 mL (1,000 mLs Intravenous New Bag/Given 04/21/17 2318)    And  sodium chloride 0.9 % bolus 1,000 mL (1,000 mLs Intravenous New Bag/Given 04/21/17 2318)    And  sodium chloride 0.9 % bolus 250 mL (250 mLs Intravenous New Bag/Given 04/21/17 2318)     Initial Impression / Assessment and Plan / ED Course  I have reviewed the triage vital signs and the nursing notes.  Pertinent labs & imaging results that were available during my care of the patient were reviewed by me and considered in my medical decision making (see chart for details).  Clinical Course as of Apr 22 2335  Sat Apr 21, 2017  2243 Urine appears very cloudy, material at the end of the urine stream looks like pus.  WBC is elevated.  Pt has a low grade temp.  Suspect UTI, infection causing his weakness.  Orthostatic vital signs noted.  Will give Iv fluids.  Start on abx.  [JK]  2313 BP remains stable. Lactic acid elevated but less than 4.  Doubt sepsis.  IV hydration ordered.  Will admit for further treatment.  [JK]  2334 Troponin elevated.  No EKG  changes.  May be related to his acute kidney injury  [JK]    Clinical Course User Index [JK] Dorie Rank, MD   Patient presented to the emergency room for evaluation of generalized  weakness. Patient was noted to have an elevated temperature. His laboratory workup is notable for acute kidney injury associated with urinary tract infection. His troponin is slightly elevated but I doubt acute cardiac ischemia. Patient was treated with IV fluids and antibiotics. Plan on admission to hospital for further treatment.   Final Clinical Impressions(s) / ED Diagnoses   Final diagnoses:  Complicated UTI (urinary tract infection)  Weakness  Acute kidney injury (HCC)      Dorie Rank, MD 04/21/17 2336

## 2017-04-22 DIAGNOSIS — N179 Acute kidney failure, unspecified: Secondary | ICD-10-CM

## 2017-04-22 DIAGNOSIS — I951 Orthostatic hypotension: Secondary | ICD-10-CM

## 2017-04-22 DIAGNOSIS — E1151 Type 2 diabetes mellitus with diabetic peripheral angiopathy without gangrene: Secondary | ICD-10-CM | POA: Diagnosis present

## 2017-04-22 DIAGNOSIS — N183 Chronic kidney disease, stage 3 (moderate): Secondary | ICD-10-CM | POA: Diagnosis present

## 2017-04-22 DIAGNOSIS — I130 Hypertensive heart and chronic kidney disease with heart failure and stage 1 through stage 4 chronic kidney disease, or unspecified chronic kidney disease: Secondary | ICD-10-CM | POA: Diagnosis present

## 2017-04-22 DIAGNOSIS — B9620 Unspecified Escherichia coli [E. coli] as the cause of diseases classified elsewhere: Secondary | ICD-10-CM | POA: Diagnosis not present

## 2017-04-22 DIAGNOSIS — R339 Retention of urine, unspecified: Secondary | ICD-10-CM

## 2017-04-22 DIAGNOSIS — Z8673 Personal history of transient ischemic attack (TIA), and cerebral infarction without residual deficits: Secondary | ICD-10-CM | POA: Diagnosis not present

## 2017-04-22 DIAGNOSIS — I5022 Chronic systolic (congestive) heart failure: Secondary | ICD-10-CM | POA: Diagnosis present

## 2017-04-22 DIAGNOSIS — Z681 Body mass index (BMI) 19 or less, adult: Secondary | ICD-10-CM | POA: Diagnosis not present

## 2017-04-22 DIAGNOSIS — E871 Hypo-osmolality and hyponatremia: Secondary | ICD-10-CM

## 2017-04-22 DIAGNOSIS — N390 Urinary tract infection, site not specified: Secondary | ICD-10-CM | POA: Diagnosis present

## 2017-04-22 DIAGNOSIS — R778 Other specified abnormalities of plasma proteins: Secondary | ICD-10-CM | POA: Insufficient documentation

## 2017-04-22 DIAGNOSIS — E1122 Type 2 diabetes mellitus with diabetic chronic kidney disease: Secondary | ICD-10-CM | POA: Diagnosis not present

## 2017-04-22 DIAGNOSIS — A419 Sepsis, unspecified organism: Secondary | ICD-10-CM | POA: Diagnosis present

## 2017-04-22 DIAGNOSIS — I2510 Atherosclerotic heart disease of native coronary artery without angina pectoris: Secondary | ICD-10-CM | POA: Diagnosis present

## 2017-04-22 DIAGNOSIS — E11622 Type 2 diabetes mellitus with other skin ulcer: Secondary | ICD-10-CM | POA: Diagnosis not present

## 2017-04-22 DIAGNOSIS — I255 Ischemic cardiomyopathy: Secondary | ICD-10-CM | POA: Diagnosis present

## 2017-04-22 DIAGNOSIS — L89159 Pressure ulcer of sacral region, unspecified stage: Secondary | ICD-10-CM

## 2017-04-22 DIAGNOSIS — R748 Abnormal levels of other serum enzymes: Secondary | ICD-10-CM | POA: Diagnosis present

## 2017-04-22 DIAGNOSIS — Z95810 Presence of automatic (implantable) cardiac defibrillator: Secondary | ICD-10-CM | POA: Diagnosis not present

## 2017-04-22 DIAGNOSIS — Z794 Long term (current) use of insulin: Secondary | ICD-10-CM | POA: Diagnosis not present

## 2017-04-22 DIAGNOSIS — E785 Hyperlipidemia, unspecified: Secondary | ICD-10-CM | POA: Diagnosis present

## 2017-04-22 DIAGNOSIS — L98491 Non-pressure chronic ulcer of skin of other sites limited to breakdown of skin: Secondary | ICD-10-CM | POA: Diagnosis not present

## 2017-04-22 DIAGNOSIS — R739 Hyperglycemia, unspecified: Secondary | ICD-10-CM

## 2017-04-22 DIAGNOSIS — I6523 Occlusion and stenosis of bilateral carotid arteries: Secondary | ICD-10-CM | POA: Diagnosis present

## 2017-04-22 DIAGNOSIS — I509 Heart failure, unspecified: Secondary | ICD-10-CM | POA: Diagnosis not present

## 2017-04-22 DIAGNOSIS — R7989 Other specified abnormal findings of blood chemistry: Secondary | ICD-10-CM

## 2017-04-22 DIAGNOSIS — L89152 Pressure ulcer of sacral region, stage 2: Secondary | ICD-10-CM | POA: Diagnosis present

## 2017-04-22 DIAGNOSIS — I252 Old myocardial infarction: Secondary | ICD-10-CM | POA: Diagnosis not present

## 2017-04-22 DIAGNOSIS — R531 Weakness: Secondary | ICD-10-CM | POA: Diagnosis present

## 2017-04-22 DIAGNOSIS — Z87891 Personal history of nicotine dependence: Secondary | ICD-10-CM | POA: Diagnosis not present

## 2017-04-22 DIAGNOSIS — R64 Cachexia: Secondary | ICD-10-CM | POA: Diagnosis present

## 2017-04-22 DIAGNOSIS — I4510 Unspecified right bundle-branch block: Secondary | ICD-10-CM | POA: Diagnosis present

## 2017-04-22 DIAGNOSIS — Z7982 Long term (current) use of aspirin: Secondary | ICD-10-CM | POA: Diagnosis not present

## 2017-04-22 DIAGNOSIS — W08XXXA Fall from other furniture, initial encounter: Secondary | ICD-10-CM | POA: Diagnosis present

## 2017-04-22 DIAGNOSIS — L8990 Pressure ulcer of unspecified site, unspecified stage: Secondary | ICD-10-CM | POA: Insufficient documentation

## 2017-04-22 LAB — BLOOD CULTURE ID PANEL (REFLEXED)
ACINETOBACTER BAUMANNII: NOT DETECTED
CANDIDA ALBICANS: NOT DETECTED
CANDIDA GLABRATA: NOT DETECTED
CANDIDA KRUSEI: NOT DETECTED
CANDIDA PARAPSILOSIS: NOT DETECTED
CARBAPENEM RESISTANCE: NOT DETECTED
Candida tropicalis: NOT DETECTED
ENTEROBACTERIACEAE SPECIES: DETECTED — AB
ENTEROCOCCUS SPECIES: NOT DETECTED
ESCHERICHIA COLI: DETECTED — AB
Enterobacter cloacae complex: NOT DETECTED
Haemophilus influenzae: NOT DETECTED
KLEBSIELLA OXYTOCA: NOT DETECTED
KLEBSIELLA PNEUMONIAE: NOT DETECTED
LISTERIA MONOCYTOGENES: NOT DETECTED
Neisseria meningitidis: NOT DETECTED
PSEUDOMONAS AERUGINOSA: NOT DETECTED
Proteus species: NOT DETECTED
STAPHYLOCOCCUS AUREUS BCID: NOT DETECTED
STREPTOCOCCUS PNEUMONIAE: NOT DETECTED
STREPTOCOCCUS PYOGENES: NOT DETECTED
Serratia marcescens: NOT DETECTED
Staphylococcus species: NOT DETECTED
Streptococcus agalactiae: NOT DETECTED
Streptococcus species: NOT DETECTED

## 2017-04-22 LAB — BASIC METABOLIC PANEL
Anion gap: 13 (ref 5–15)
Anion gap: 9 (ref 5–15)
BUN: 55 mg/dL — ABNORMAL HIGH (ref 6–20)
BUN: 51 mg/dL — AB (ref 6–20)
CALCIUM: 9.1 mg/dL (ref 8.9–10.3)
CO2: 24 mmol/L (ref 22–32)
CO2: 24 mmol/L (ref 22–32)
CREATININE: 2.54 mg/dL — AB (ref 0.61–1.24)
CREATININE: 2.66 mg/dL — AB (ref 0.61–1.24)
Calcium: 8.4 mg/dL — ABNORMAL LOW (ref 8.9–10.3)
Chloride: 92 mmol/L — ABNORMAL LOW (ref 101–111)
Chloride: 100 mmol/L — ABNORMAL LOW (ref 101–111)
GFR calc non Af Amer: 21 mL/min — ABNORMAL LOW (ref >60)
GFR, EST AFRICAN AMERICAN: 24 mL/min — AB (ref >60)
GFR, EST AFRICAN AMERICAN: 25 mL/min — AB (ref >60)
GFR, EST NON AFRICAN AMERICAN: 22 mL/min — AB (ref >60)
Glucose, Bld: 300 mg/dL — ABNORMAL HIGH (ref 65–99)
Glucose, Bld: 175 mg/dL — ABNORMAL HIGH (ref 65–99)
Potassium: 5.2 mmol/L — ABNORMAL HIGH (ref 3.5–5.1)
Potassium: 3.8 mmol/L (ref 3.5–5.1)
SODIUM: 133 mmol/L — AB (ref 135–145)
Sodium: 129 mmol/L — ABNORMAL LOW (ref 135–145)

## 2017-04-22 LAB — GLUCOSE, CAPILLARY
GLUCOSE-CAPILLARY: 294 mg/dL — AB (ref 65–99)
GLUCOSE-CAPILLARY: 212 mg/dL — AB (ref 65–99)
GLUCOSE-CAPILLARY: 44 mg/dL — AB (ref 65–99)
Glucose-Capillary: 139 mg/dL — ABNORMAL HIGH (ref 65–99)
Glucose-Capillary: 140 mg/dL — ABNORMAL HIGH (ref 65–99)
Glucose-Capillary: 46 mg/dL — ABNORMAL LOW (ref 65–99)
Glucose-Capillary: 56 mg/dL — ABNORMAL LOW (ref 65–99)
Glucose-Capillary: 60 mg/dL — ABNORMAL LOW (ref 65–99)
Glucose-Capillary: 166 mg/dL — ABNORMAL HIGH (ref 65–99)
Glucose-Capillary: 191 mg/dL — ABNORMAL HIGH (ref 65–99)

## 2017-04-22 LAB — OSMOLALITY, URINE: OSMOLALITY UR: 326 mosm/kg (ref 300–900)

## 2017-04-22 LAB — CBC
HEMATOCRIT: 34.6 % — AB (ref 39.0–52.0)
Hemoglobin: 11.9 g/dL — ABNORMAL LOW (ref 13.0–17.0)
MCH: 29.9 pg (ref 26.0–34.0)
MCHC: 34.4 g/dL (ref 30.0–36.0)
MCV: 86.9 fL (ref 78.0–100.0)
PLATELETS: 139 10*3/uL — AB (ref 150–400)
RBC: 3.98 MIL/uL — ABNORMAL LOW (ref 4.22–5.81)
RDW: 13.3 % (ref 11.5–15.5)
WBC: 17.7 10*3/uL — AB (ref 4.0–10.5)

## 2017-04-22 LAB — TROPONIN I
TROPONIN I: 0.12 ng/mL — AB (ref <0.03)
Troponin I: 0.12 ng/mL (ref <0.03)
Troponin I: 0.14 ng/mL (ref <0.03)

## 2017-04-22 LAB — LACTIC ACID, PLASMA: Lactic Acid, Venous: 2 mmol/L (ref 0.5–1.9)

## 2017-04-22 LAB — OSMOLALITY: Osmolality: 308 mOsm/kg — ABNORMAL HIGH (ref 275–295)

## 2017-04-22 LAB — SODIUM, URINE, RANDOM: Sodium, Ur: 18 mmol/L

## 2017-04-22 MED ORDER — ORAL CARE MOUTH RINSE
15.0000 mL | Freq: Two times a day (BID) | OROMUCOSAL | Status: DC
  Administered 2017-04-22 – 2017-04-25 (×7): 15 mL via OROMUCOSAL

## 2017-04-22 MED ORDER — DEXTROSE 5 % IV SOLN
1.0000 g | INTRAVENOUS | Status: DC
  Filled 2017-04-22: qty 10

## 2017-04-22 MED ORDER — SENNOSIDES-DOCUSATE SODIUM 8.6-50 MG PO TABS
2.0000 | ORAL_TABLET | Freq: Every day | ORAL | Status: DC
  Administered 2017-04-22 – 2017-04-25 (×3): 2 via ORAL
  Filled 2017-04-22 (×3): qty 2

## 2017-04-22 MED ORDER — ACETAMINOPHEN 650 MG RE SUPP: 650.0000 mg | Freq: Four times a day (QID) | RECTAL | Status: DC | PRN

## 2017-04-22 MED ORDER — CARVEDILOL 12.5 MG PO TABS
12.5000 mg | ORAL_TABLET | Freq: Two times a day (BID) | ORAL | Status: DC
  Administered 2017-04-22 – 2017-04-25 (×8): 12.5 mg via ORAL
  Filled 2017-04-22 (×9): qty 1

## 2017-04-22 MED ORDER — SODIUM CHLORIDE 0.9 % IV BOLUS (SEPSIS): 250.0000 mL | Freq: Once | INTRAVENOUS | Status: DC

## 2017-04-22 MED ORDER — POLYETHYLENE GLYCOL 3350 17 G PO PACK
17.0000 g | PACK | Freq: Every day | ORAL | Status: DC
  Administered 2017-04-22 – 2017-04-25 (×3): 17 g via ORAL
  Filled 2017-04-22 (×3): qty 1

## 2017-04-22 MED ORDER — INSULIN GLARGINE 100 UNIT/ML ~~LOC~~ SOLN
10.0000 [IU] | Freq: Every day | SUBCUTANEOUS | Status: DC
  Administered 2017-04-22: 10 [IU] via SUBCUTANEOUS
  Filled 2017-04-22: qty 0.1

## 2017-04-22 MED ORDER — INSULIN ASPART 100 UNIT/ML ~~LOC~~ SOLN
0.0000 [IU] | Freq: Three times a day (TID) | SUBCUTANEOUS | Status: DC
  Administered 2017-04-22 (×2): 1 [IU] via SUBCUTANEOUS
  Administered 2017-04-24 (×2): 2 [IU] via SUBCUTANEOUS
  Administered 2017-04-24: 1 [IU] via SUBCUTANEOUS
  Administered 2017-04-25: 7 [IU] via SUBCUTANEOUS
  Administered 2017-04-25 (×2): 5 [IU] via SUBCUTANEOUS

## 2017-04-22 MED ORDER — TAMSULOSIN HCL 0.4 MG PO CAPS: 0.4000 mg | ORAL_CAPSULE | Freq: Every day | ORAL | Status: DC

## 2017-04-22 MED ORDER — INSULIN GLARGINE 100 UNIT/ML ~~LOC~~ SOLN
5.0000 [IU] | Freq: Once | SUBCUTANEOUS | Status: AC
  Administered 2017-04-22: 5 [IU] via SUBCUTANEOUS
  Filled 2017-04-22: qty 0.05

## 2017-04-22 MED ORDER — ACETAMINOPHEN 325 MG PO TABS
650.0000 mg | ORAL_TABLET | Freq: Four times a day (QID) | ORAL | Status: DC | PRN
  Administered 2017-04-22 – 2017-04-25 (×4): 650 mg via ORAL
  Filled 2017-04-22 (×4): qty 2

## 2017-04-22 MED ORDER — DEXTROSE 50 % IV SOLN
25.0000 mL | INTRAVENOUS | Status: AC
  Administered 2017-04-22: 25 mL via INTRAVENOUS

## 2017-04-22 MED ORDER — FLUOROMETHOLONE 0.1 % OP SUSP
2.0000 [drp] | Freq: Every day | OPHTHALMIC | Status: DC
  Administered 2017-04-22 – 2017-04-25 (×4): 2 [drp] via OPHTHALMIC
  Filled 2017-04-22: qty 5

## 2017-04-22 MED ORDER — SODIUM CHLORIDE 0.9 % IV BOLUS (SEPSIS): 500.0000 mL | Freq: Once | INTRAVENOUS | Status: DC

## 2017-04-22 MED ORDER — DOCUSATE SODIUM 100 MG PO CAPS
100.0000 mg | ORAL_CAPSULE | Freq: Two times a day (BID) | ORAL | Status: DC
  Administered 2017-04-22 – 2017-04-25 (×7): 100 mg via ORAL
  Filled 2017-04-22 (×8): qty 1

## 2017-04-22 MED ORDER — DEXTROSE 50 % IV SOLN
INTRAVENOUS | Status: AC
  Filled 2017-04-22: qty 50

## 2017-04-22 MED ORDER — DEXTROSE 5 % IV SOLN
2.0000 g | INTRAVENOUS | Status: DC
  Administered 2017-04-22 – 2017-04-24 (×2): 2 g via INTRAVENOUS
  Filled 2017-04-22 (×3): qty 2

## 2017-04-22 MED ORDER — HEPARIN SODIUM (PORCINE) 5000 UNIT/ML IJ SOLN
5000.0000 [IU] | Freq: Three times a day (TID) | INTRAMUSCULAR | Status: DC
  Administered 2017-04-22 – 2017-04-25 (×9): 5000 [IU] via SUBCUTANEOUS
  Filled 2017-04-22 (×10): qty 1

## 2017-04-22 MED ORDER — PANTOPRAZOLE SODIUM 40 MG PO TBEC
40.0000 mg | DELAYED_RELEASE_TABLET | Freq: Every day | ORAL | Status: DC
  Administered 2017-04-22 – 2017-04-25 (×4): 40 mg via ORAL
  Filled 2017-04-22 (×4): qty 1

## 2017-04-22 MED ORDER — INSULIN ASPART 100 UNIT/ML ~~LOC~~ SOLN
0.0000 [IU] | Freq: Every day | SUBCUTANEOUS | Status: DC
  Administered 2017-04-22: 3 [IU] via SUBCUTANEOUS

## 2017-04-22 MED ORDER — ASPIRIN EC 81 MG PO TBEC
81.0000 mg | DELAYED_RELEASE_TABLET | Freq: Every day | ORAL | Status: DC
  Administered 2017-04-22 – 2017-04-25 (×4): 81 mg via ORAL
  Filled 2017-04-22 (×4): qty 1

## 2017-04-22 MED ORDER — ATORVASTATIN CALCIUM 40 MG PO TABS
40.0000 mg | ORAL_TABLET | Freq: Every day | ORAL | Status: DC
  Administered 2017-04-22 – 2017-04-25 (×4): 40 mg via ORAL
  Filled 2017-04-22 (×5): qty 1

## 2017-04-22 MED ORDER — SODIUM CHLORIDE 0.9 % IV SOLN
INTRAVENOUS | Status: DC
  Administered 2017-04-22: 05:00:00 via INTRAVENOUS

## 2017-04-22 MED ORDER — INSULIN GLARGINE 100 UNIT/ML ~~LOC~~ SOLN
15.0000 [IU] | Freq: Every day | SUBCUTANEOUS | Status: DC
  Administered 2017-04-22: 15 [IU] via SUBCUTANEOUS
  Filled 2017-04-22: qty 0.15

## 2017-04-22 MED ORDER — ENSURE ENLIVE PO LIQD
237.0000 mL | Freq: Every day | ORAL | Status: DC
  Administered 2017-04-22 – 2017-04-25 (×4): 237 mL via ORAL

## 2017-04-22 MED ORDER — INSULIN ASPART 100 UNIT/ML ~~LOC~~ SOLN
2.0000 [IU] | Freq: Three times a day (TID) | SUBCUTANEOUS | Status: DC
  Administered 2017-04-22 (×2): 2 [IU] via SUBCUTANEOUS

## 2017-04-22 NOTE — H&P (Signed)
Date: 04/22/2017               Patient Name:  [name] MRN: [id number]  DOB: April 26, 1931 Age / Sex: 83 y.o., male   PCP: Axel Filler, MD         Medical Service: Internal Medicine Teaching Service         Attending Physician: Dr. Sid Falcon, MD    First Contact: Dr. Tarri Abernethy Pager: [phone]  Second Contact: Dr. Beau Fanny Pager: [phone]       After Hours (After 5p/  First Contact Pager: [phone]  weekends / holidays): Second Contact Pager: [phone]   Chief Complaint: Weakness  History of Present Illness: 83 yo male with pmh of ischemic chronic CHF s/p ICD placement 2005,CAD, [name] stage 3, cachexia with suspicion for malignancy,  T2DM poorly controlled, Pt states that since 7am this morning he has felt weak.  He tried to get up from sitting on the couch and fell back onto the couch. He says he felt a little dizzy after the event but denies shortness of breath, chest pain, vision changes.  He stayed on the couch for the remainder of the day before deciding to come to the ED.  He admits to having a headache the last two days, he took an advil this morning.  In the ED, the patient was found to have a temp 100.4, and on urinalysis showed TNTC WBCs, ketones  Many bacteria, pt was started on ceftriaxone IV.  Pt denies dysuria but has noticed decreased frequency today, and had an episode of incontinence in the ED.  Pt denies nausea vomiting diarrhea.    Meds:  Current Meds  Medication Sig  . aspirin EC 81 MG tablet Take 81 mg by mouth daily.  Marland Kitchen atorvastatin (LIPITOR) 40 MG tablet TAKE 1 TABLET(40 MG) BY MOUTH DAILY AT 6 PM  . carvedilol (COREG) 12.5 MG tablet TAKE 1 TABLET(12.5 MG) BY MOUTH TWICE DAILY  . docusate sodium (COLACE) 100 MG capsule Take 1 capsule (100 mg total) by mouth every 12 (twelve) hours.  . fluorometholone (FML) 0.1 % ophthalmic suspension Place 2 drops into both eyes daily.   . furosemide (LASIX) 40 MG tablet Take 1 tablet (40 mg total) by mouth 2 (two)  times daily.  . Insulin Glargine (LANTUS SOLOSTAR) 100 UNIT/ML Solostar Pen Inject 15 Units into the skin at bedtime. (Patient taking differently: Inject 20 Units into the skin at bedtime. )  . lactose free nutrition (BOOST) LIQD Take 237 mLs by mouth daily.  . pantoprazole (PROTONIX) 40 MG tablet TAKE 1 TABLET(40 MG) BY MOUTH DAILY  . polyethylene glycol (MIRALAX / GLYCOLAX) packet Take 17 g by mouth daily.  Marland Kitchen senna-docusate (SENOKOT-S) 8.6-50 MG tablet Take 2 tablets by mouth daily.  . tamsulosin (FLOMAX) 0.4 MG CAPS capsule Take 1 capsule (0.4 mg total) by mouth daily. (Patient taking differently: Take 0.4 mg by mouth daily after supper. )  . VENTOLIN HFA 108 (90 Base) MCG/ACT inhaler INHALE 2 PUFFS INTO THE LUNGS EVERY 6 HOURS AS NEEDED FOR WHEEZING OR SHORTNESS OF BREATH     Allergies: Allergies as of 04/21/2017  . (No Known Allergies)   Past Medical History:  Diagnosis Date  . Adenomatous colon polyp 02/14/2012  . BBB (bundle branch block)    right  . Carotid stenosis    a. s/p L carotid stent 2004;  b. Carotid US (09/2014): Bilateral ICA 1-39%, left ECA >59%, normal subclavian bilaterally, occluded left  vertebral >> FU 2 years  . Chronic kidney disease   . Chronic systolic CHF (congestive heart failure) (HCC)    a. ischemic CM EF 15-20%;  b. s/p AICD 05/24/04;  c. Echo 7/06: EF 30-40%, mild reduced RVSF, d. Echo 12/2015 EF 35-40%  . Diverticulosis of colon   . Dysrhythmia   . Elevated PSA   . HTN (hypertension)   . Hyperlipidemia   . ICD (implantable cardiac defibrillator) in place 12-25-2012   MDT CRTD upgrade by Dr Lovena Le  . Myocardial infarction ([place]) 1990  . Pericardial effusion    Echocardiogram (09/2014): EF 25% with distal anterior, distal inferior, distal lateral and apical akinesis, grade 1 diastolic dysfunction, very mild aortic stenosis (mean 7 mmHg) - this may be depressed due to low EF (2-D images suggest mild to moderate aortic stenosis), large pericardial effusion,  no RA collapse  . Peripheral vascular disease ([place])   . PVD (peripheral vascular disease) ([place])    s/p L carotid PTCA/stent 2004  . Transient ischemic attack   . Type II diabetes mellitus (HCC)     Family History:  Family History  Problem Relation Age of Onset  . Diabetes Mother   . Diabetes Brother   . Heart attack Neg Hx   . Stroke Neg Hx     Social History:  Social History   Social History  . Marital status: Widowed    Spouse name: N/A  . Number of children: N/A  . Years of education: 83   Occupational History  . retired    Social History Main Topics  . Smoking status: Former Smoker    Types: Cigarettes    Quit date: 12/27/1967  . Smokeless tobacco: Never Used  . Alcohol use No     Comment: 12/25/2012 "quit all alcohol 60 yr ago"  . Drug use: No  . Sexual activity: Not on file   Other Topics Concern  . Not on file   Social History Narrative   ** Merged History Encounter **       Single, 2 adult children, daughter in [place], son in [place]: Review of Systems  Constitutional: Positive for chills. Negative for fever and weight loss.  HENT: Negative for ear pain, hearing loss, sore throat and tinnitus.   Eyes: Negative for blurred vision, double vision and photophobia.  Cardiovascular: Negative for chest pain, palpitations and leg swelling.  Gastrointestinal: Negative for abdominal pain, blood in stool, constipation, diarrhea, heartburn, nausea and vomiting.  Genitourinary: Positive for frequency (decreased today). Negative for dysuria, flank pain and urgency.  Musculoskeletal: Positive for back pain. Negative for myalgias and neck pain.  Skin: Negative for rash.  Neurological: Positive for dizziness and headaches. Negative for sensory change, speech change, focal weakness, seizures and loss of consciousness.  Psychiatric/Behavioral: Negative for depression and suicidal ideas.    Physical Exam: Blood pressure (!) 90/42, pulse 75,  temperature 98.5 F (36.9 C), temperature source Oral, resp. rate 15, height 5\' 10"  (1.778 m), weight 129 lb (58.5 kg), SpO2 96 %. Physical Exam  Constitutional: Vital signs are normal. He appears cachectic. He is cooperative. He is easily aroused.  Non-toxic appearance. No distress.  HENT:  Head: Normocephalic and atraumatic.  Eyes: Pupils are equal, round, and reactive to light. EOM are normal.  Neck: No tracheal deviation present. No thyromegaly present.  Cardiovascular: Normal rate and regular rhythm.   Abdominal: Soft. Bowel sounds are normal. He exhibits no distension. There is no  tenderness.  Musculoskeletal:       Lumbar back: He exhibits tenderness and pain.       Back:  Stage 2 pressure ulcer on sacral region  Neurological: He is alert and easily aroused.    EKG: personally reviewed my interpretation is sinus rhythm, RBBB  CXR: personally reviewed my interpretation is N/A  Assessment & Plan by Problem: Principal Problem:   Urinary tract infection Active Problems:   Type 2 diabetes mellitus with peripheral vascular disease ([place])   Urinary retention   Elevated troponin   Elevated lactic acid level   Orthostatic hypotension   Hyponatremia   Hyperglycemia   Pressure injury of skin   Sacral pressure ulcer  83 yo male with pmh of ischemic chronic CHF s/p ICD placement 2005,CAD, [name] stage 3, cachexia with suspicion for malignancy,  T2DM poorly controlled, presents with weakness, chills, fever, urinary retention/incontinence beginning this morning.   UTI: Pt has history of urinary retention, according to pt just began again today but could have been going on longer, pts blood glucose also likely poorly controlled which predisposes him to developing UTI's  -Continue Rocephin IV 1g Qd -Urine Culture pending  Hyperglycemia: Insulin dependent T2DM poorly controlled, pt prescribed 15 units qday, pt taking 6 units BID -home dose of lantus 15 -humalog 3 units  Urinary  Retention: acute on chronic issue pt claims began this morning -I and O cath as needed -Serial bladder scans to monitor for retention  Elevated Lactic Acid: Pts blood pressures have remained his baseline throughout stay, could be a number of factors elevating his lactate I.e. dehydration, hyperglycemia,  -IV fluids -Abx -Trending down last reading 2.0 from 2.17  Elevated Troponin: mildly elevated at 0.1, pt asymptomatic, has poor renal function and ICD in place, likely has chronic troponin leak.  -ECG unchanged from last, no ST changes, existing RBBB -serial troponins -slight raise in second troponin to 0.12  Hyponatremia: serum sodium corrected for glucose = 132, wide differential for this particular patient, poor nutrition, CHF, Infection, Malignancy -IV Normal saline for resuscitation -Pt not symptomatic   Orthostatic hypotension: patient likely fluid deplete, has poor cardiac reserve, possible autonomic nerve dysfunction 2/2 poorly controlled diabets -gentle fluid resuscitation -pt received 2 1/2 L normal saline so far   Stage 2 pressure ulcer: sacral ulcer tender to palpation, patient complaining of pain -nursing orders to turn patient periodically -keep pt dry and continent with serial bladder checks and in and out caths as needed  Dispo: Admit patient to Inpatient with expected length of stay greater than 2 midnights.  Signed: Katherine Roan, MD 04/22/2017, 6:54 AM  Vickki Muff MD PGY-1 Internal Medicine Pager # [phone]

## 2017-04-22 NOTE — Progress Notes (Signed)
Subjective: Patient denies having any complaints and wants to go home. Denies having any dysuria. States he was not urinating much before coming into the hospital. Denies having any CP. Discussed the plan with him and he agrees.   Objective:  Vital signs in last 24 hours: Vitals:   04/22/17 0100 04/22/17 0447 04/22/17 0500 04/22/17 1100  BP: 132/63 (!) 93/40 (!) 90/42 (!) 94/50  Pulse: 88 79 75 77  Resp: 16 15  16   Temp: 99 F (37.2 C) 98.5 F (36.9 C)  98.8 F (37.1 C)  TempSrc: Oral Oral  Oral  SpO2: 100% 96%  98%  Weight: 129 lb (58.5 kg)     Height: 5\' 10"  (1.778 m)      Physical Exam  Constitutional: No distress.  Cachectic elderly gentleman   Cardiovascular: Normal rate, regular rhythm and intact distal pulses.   Pulmonary/Chest: Effort normal and breath sounds normal. No respiratory distress. He has no wheezes. He has no rales.  Abdominal: Soft. Bowel sounds are normal. He exhibits no distension. There is no tenderness.  Musculoskeletal: He exhibits no edema.  Neurological: He is alert.  Skin: Skin is warm and dry.  Sacral pressure ulcer with dressing in place. Site does not appear infected.     Assessment/Plan:  Principal Problem:   Urinary tract infection Active Problems:   Type 2 diabetes mellitus with peripheral vascular disease ([place])   Urinary retention   Elevated troponin   Elevated lactic acid level   Orthostatic hypotension   Hyponatremia   Hyperglycemia   Pressure injury of skin   Sacral pressure ulcer   Weakness  83 yo male with pmh of ischemic chronic CHF s/p ICD placement 2005, CAD, [name] stage 3, cachexia with suspicion for malignancy, T2DM poorly controlled, presents with weakness, chills, fever, urinary retention/incontinence.    Sepsis 2/2 urinary source: Was febrile on admission, afebrile since then. WBC 17.3. UA suggestive of infection but not clean catch (has squamous epithelial cells). Does have a sacral pressure ulcer but does not  appear to be the source. Lactate trended down. Already received >2L boluses in the ED. He has a hx of CHF with EF 30-35%. Will hold off giving any more IVF at this time. BP soft but maintaining MAP in 70s.  -Continue Rocephin -Urine Culture pending -Give small boluses if MAP <65 -PT/ OT eval   Poorly controlled IDDM: A1c 9.7 in 10/2015. CBGs in 100s at present.  -Repeat A1c pending  -Home dose of lantus 15 -Novolog 2 u TID -SSI-S  Urinary Retention: acute on chronic issue pt claims began this morning -I and O cath as needed -Bladder scans to monitor for retention -Strict intake and output   Elevated Troponin: Trops flat (0.12>0.14>0.12). EKG with prior RBBB and no acute ischemic changes. Patient asymptomatic, likely in the setting of poor baseline renal function and possible demand ischemia from sepsis.   Hyponatremia: Resolved. Corrected Na 134-135.   Orthostatic hypotension: In the setting of dehydration, poor cardiac reserve, possible autonomic nerve dysfunction 2/2 poorly controlled diabetes. Already received >2L boluses in the ED.  -Repeat orthostatics in am   Stage 2 pressure ulcer: No signs of infection at this time.  -nursing orders to turn patient periodically -keep pt dry and continent with bladder checks and in and out caths as needed  Weight loss: This has been a concern for his PCP for a few months. Weight recorded on admission does not seem accurate. -Weigh patient again  -Will order CT  chest, abdomen, and pelvis w/ contrast to evaluate for malignancy  -250cc NS bolus before CT and 250cc bolus after CT  Dispo: Anticipated discharge in approximately 1-2 day(s).   Shela Leff, MD 04/22/2017, 2:21 PM Pager: [phone]

## 2017-04-22 NOTE — Progress Notes (Signed)
CRITICAL VALUE ALERT  Critical Value:  Troponin 0.12  Date & Time Notied:  9611/64 at [place]  Provider Notified: Shan Levans, MD  Orders Received/Actions taken: No new orders at this time per MD

## 2017-04-22 NOTE — ED Notes (Signed)
Admitting MD at the bedside.  

## 2017-04-22 NOTE — Progress Notes (Signed)
Hypoglycemic Event  CBG: 1740  Treatment: 1 tube dextrose gel and 1/2amp D50 IV  Symptoms: cold and clammy.  Follow-up CBG: Time: 6435 CBG Result: 166  Possible Reasons for Event: inadequate PO intake  Comments/MD notified: No    [name] [name]

## 2017-04-22 NOTE — Progress Notes (Signed)
CRITICAL VALUE ALERT  Critical Value:  Lactic acid 2.0  Date & Time Notied:  0340  Provider Notified: W[place]  Orders Received/Actions taken: No new orders at this time

## 2017-04-22 NOTE — Progress Notes (Signed)
Pt arrived to 6E20. VS stable, denied chest pain and SOB, no signs of acute distress.  Pt identified properly, alert and oriented x 4. Cardiac monitor and continuous pulse oxymetry started. Oriented to room and equipment, instructed to use call bell for assistance, call light within reach. Pt high fall risk, low bed and floor mats in place.  Will continue to monitor pt and treat per MD orders.

## 2017-04-22 NOTE — Progress Notes (Addendum)
Pt BP 90/42, pt alert and oriented. MD notified Md gave instruction to monitor pt at this time.

## 2017-04-22 NOTE — ED Notes (Signed)
Report attempted, RN to call back. 

## 2017-04-22 NOTE — Progress Notes (Signed)
PHARMACY - PHYSICIAN COMMUNICATION CRITICAL VALUE ALERT - BLOOD CULTURE IDENTIFICATION (BCID)  Results for orders placed or performed during the hospital encounter of 04/21/17  Blood Culture ID Panel (Reflexed) (Collected: 04/21/2017 11:15 PM)  Result Value Ref Range   Enterococcus species NOT DETECTED NOT DETECTED   Listeria monocytogenes NOT DETECTED NOT DETECTED   Staphylococcus species NOT DETECTED NOT DETECTED   Staphylococcus aureus NOT DETECTED NOT DETECTED   Streptococcus species NOT DETECTED NOT DETECTED   Streptococcus agalactiae NOT DETECTED NOT DETECTED   Streptococcus pneumoniae NOT DETECTED NOT DETECTED   Streptococcus pyogenes NOT DETECTED NOT DETECTED   Acinetobacter baumannii NOT DETECTED NOT DETECTED   Enterobacteriaceae species DETECTED (A) NOT DETECTED   Enterobacter cloacae complex NOT DETECTED NOT DETECTED   Escherichia coli DETECTED (A) NOT DETECTED   Klebsiella oxytoca NOT DETECTED NOT DETECTED   Klebsiella pneumoniae NOT DETECTED NOT DETECTED   Proteus species NOT DETECTED NOT DETECTED   Serratia marcescens NOT DETECTED NOT DETECTED   Carbapenem resistance NOT DETECTED NOT DETECTED   Haemophilus influenzae NOT DETECTED NOT DETECTED   Neisseria meningitidis NOT DETECTED NOT DETECTED   Pseudomonas aeruginosa NOT DETECTED NOT DETECTED   Candida albicans NOT DETECTED NOT DETECTED   Candida glabrata NOT DETECTED NOT DETECTED   Candida krusei NOT DETECTED NOT DETECTED   Candida parapsilosis NOT DETECTED NOT DETECTED   Candida tropicalis NOT DETECTED NOT DETECTED    2/2 blood cultures with GNR, BCID with EColi. On CTX 1g q24h at present.   Name of physician (or Provider) Contacted: Dr. Marlowe Sax - text paged  Changes to prescribed antibiotics required: Increase CTX to 2g q24h  [name] 04/22/2017  3:20 PM

## 2017-04-23 ENCOUNTER — Inpatient Hospital Stay (HOSPITAL_COMMUNITY): Payer: Medicare HMO

## 2017-04-23 ENCOUNTER — Encounter (HOSPITAL_COMMUNITY): Payer: Self-pay | Admitting: *Deleted

## 2017-04-23 DIAGNOSIS — R7881 Bacteremia: Secondary | ICD-10-CM | POA: Diagnosis present

## 2017-04-23 DIAGNOSIS — N390 Urinary tract infection, site not specified: Secondary | ICD-10-CM

## 2017-04-23 LAB — BASIC METABOLIC PANEL
ANION GAP: 7 (ref 5–15)
BUN: 64 mg/dL — ABNORMAL HIGH (ref 6–20)
CALCIUM: 8.9 mg/dL (ref 8.9–10.3)
CO2: 27 mmol/L (ref 22–32)
Chloride: 99 mmol/L — ABNORMAL LOW (ref 101–111)
Creatinine, Ser: 2.59 mg/dL — ABNORMAL HIGH (ref 0.61–1.24)
GFR calc non Af Amer: 21 mL/min — ABNORMAL LOW (ref >60)
GFR, EST AFRICAN AMERICAN: 25 mL/min — AB (ref >60)
Glucose, Bld: 118 mg/dL — ABNORMAL HIGH (ref 65–99)
Potassium: 4.4 mmol/L (ref 3.5–5.1)
Sodium: 133 mmol/L — ABNORMAL LOW (ref 135–145)

## 2017-04-23 LAB — HEMOGLOBIN A1C
HEMOGLOBIN A1C: 12.1 % — AB (ref 4.8–5.6)
MEAN PLASMA GLUCOSE: 301 mg/dL

## 2017-04-23 LAB — CBC
HEMATOCRIT: 40.3 % (ref 39.0–52.0)
HEMOGLOBIN: 13.7 g/dL (ref 13.0–17.0)
MCH: 29.7 pg (ref 26.0–34.0)
MCHC: 34 g/dL (ref 30.0–36.0)
MCV: 87.4 fL (ref 78.0–100.0)
Platelets: 144 10*3/uL — ABNORMAL LOW (ref 150–400)
RBC: 4.61 MIL/uL (ref 4.22–5.81)
RDW: 13.5 % (ref 11.5–15.5)
WBC: 13.5 10*3/uL — AB (ref 4.0–10.5)

## 2017-04-23 LAB — GLUCOSE, CAPILLARY
GLUCOSE-CAPILLARY: 112 mg/dL — AB (ref 65–99)
GLUCOSE-CAPILLARY: 116 mg/dL — AB (ref 65–99)
Glucose-Capillary: 77 mg/dL (ref 65–99)
Glucose-Capillary: 162 mg/dL — ABNORMAL HIGH (ref 65–99)

## 2017-04-23 MED ORDER — IOPAMIDOL (ISOVUE-300) INJECTION 61%
INTRAVENOUS | Status: AC
  Filled 2017-04-23: qty 30

## 2017-04-23 MED ORDER — INSULIN GLARGINE 100 UNIT/ML ~~LOC~~ SOLN
10.0000 [IU] | Freq: Every day | SUBCUTANEOUS | Status: DC
  Administered 2017-04-23 – 2017-04-24 (×2): 10 [IU] via SUBCUTANEOUS
  Filled 2017-04-23 (×3): qty 0.1

## 2017-04-23 MED ORDER — GLUCERNA SHAKE PO LIQD
237.0000 mL | Freq: Two times a day (BID) | ORAL | Status: DC
  Administered 2017-04-23 – 2017-04-25 (×3): 237 mL via ORAL

## 2017-04-23 NOTE — Evaluation (Signed)
Physical Therapy Evaluation Patient Details Name: [name] MRN: [id number] DOB: [birth year] Today's Date: 04/23/2017   History of Present Illness  83 y/o male admitted with bacteremia due to e. coli after having fall at home due to weakness.  Pt is awaiting CT of pelvis due to suspected malignancy. PMH: DM, CAD/MI, CHF, PVD, CHF  Clinical Impression  Pt admitted with above diagnosis. Pt currently with functional limitations due to the deficits listed below (see PT Problem List).  Pt will benefit from skilled PT to increase their independence and safety with mobility to allow discharge to the venue listed below.  Pt would benefit from short term SNF at this time.  Pt is in agreement.  Pt pleasant and cooperative throughout session.  Pt was unaware he had BM in the bed.  Orthostatics (asymptomatic) Supine 82/56 Sitting 73/43 Standing 85/39     Follow Up Recommendations SNF;Supervision for mobility/OOB    Equipment Recommendations  None recommended by PT    Recommendations for Other Services       Precautions / Restrictions Precautions Precautions: Fall Restrictions Weight Bearing Restrictions: No      Mobility  Bed Mobility Overal bed mobility: Needs Assistance Bed Mobility: Supine to Sit     Supine to sit: Mod assist;+2 for physical assistance     General bed mobility comments: Pt pulled on 1 therapist arm to stabilize and then also had assistant with bed pad to get hips turned. Pt unaware that he had BM in the bed.  Transfers Overall transfer level: Needs assistance   Transfers: Sit to/from Stand;Stand Pivot Transfers Sit to Stand: Min assist;+2 physical assistance Stand pivot transfers: Min assist;+2 physical assistance       General transfer comment: Flexed posture with use of RW  Ambulation/Gait             General Gait Details: deferred  Stairs            Wheelchair Mobility    Modified Rankin (Stroke Patients Only)       Balance  Overall balance assessment: History of Falls;Needs assistance   Sitting balance-Leahy Scale: Fair       Standing balance-Leahy Scale: Poor Standing balance comment: Pt able to briefly stand with unilateral support.                             Pertinent Vitals/Pain Pain Assessment: Faces Faces Pain Scale: Hurts little more Pain Location: Buttocks Pain Descriptors / Indicators: Grimacing Pain Intervention(s): Monitored during session;Repositioned    Home Living Family/patient expects to be discharged to:: Private residence Living Arrangements: Alone   Type of Home: Apartment Home Access: Ramped entrance     Home Layout: One level Home Equipment: Hand held shower head;Walker - 2 wheels;Walker - 4 wheels Agricultural consultant)      Prior Function Level of Independence: Independent         Comments: Reports performing ADLs and IADLs     Hand Dominance   Dominant Hand: Right    Extremity/Trunk Assessment   Upper Extremity Assessment Upper Extremity Assessment: Defer to OT evaluation    Lower Extremity Assessment Lower Extremity Assessment: Generalized weakness    Cervical / Trunk Assessment Cervical / Trunk Assessment: Normal  Communication   Communication: HOH  Cognition Arousal/Alertness: Awake/alert Behavior During Therapy: WFL for tasks assessed/performed Overall Cognitive Status: Within Functional Limits for tasks assessed  General Comments General comments (skin integrity, edema, etc.): Pt incontinent of stool in bed and also in standing.  Pt also with urinary incontience in standing (condom cath had fallen off).    Exercises     Assessment/Plan    PT Assessment Patient needs continued PT services  PT Problem List Decreased strength;Decreased activity tolerance;Decreased balance;Decreased mobility       PT Treatment Interventions DME instruction;Gait training;Functional mobility  training;Therapeutic activities;Therapeutic exercise;Balance training;Patient/family education    PT Goals (Current goals can be found in the Care Plan section)  Acute Rehab PT Goals Patient Stated Goal: Pt agreeable to go to rehab PT Goal Formulation: With patient Time For Goal Achievement: 05/07/17 Potential to Achieve Goals: Good    Frequency Min 3X/week   Barriers to discharge Decreased caregiver support      Co-evaluation PT/OT/SLP Co-Evaluation/Treatment: Yes Reason for Co-Treatment: For patient/therapist safety PT goals addressed during session: Mobility/safety with mobility;Balance         AM-PAC PT "6 Clicks" Daily Activity  Outcome Measure Difficulty turning over in bed (including adjusting bedclothes, sheets and blankets)?: Total Difficulty moving from lying on back to sitting on the side of the bed? : Total Difficulty sitting down on and standing up from a chair with arms (e.g., wheelchair, bedside commode, etc,.)?: Total Help needed moving to and from a bed to chair (including a wheelchair)?: A Little Help needed walking in hospital room?: A Little Help needed climbing 3-5 steps with a railing? : A Lot 6 Click Score: 11    End of Session   Activity Tolerance: Patient tolerated treatment well Patient left: in chair;with call bell/phone within reach;with chair alarm set Nurse Communication: Mobility status PT Visit Diagnosis: Unsteadiness on feet (R26.81);Difficulty in walking, not elsewhere classified (R26.2);History of falling (Z91.81)    Time: 4709-2957 PT Time Calculation (min) (ACUTE ONLY): 32 min   Charges:   PT Evaluation $PT Eval Moderate Complexity: 1 Procedure     PT G Codes:        Laurisa Sahakian L. Tamala Julian, [place] Pager [phone] 04/23/2017   Galen Manila 04/23/2017, 10:28 AM

## 2017-04-23 NOTE — Progress Notes (Signed)
Internal Medicine Attending:   I saw and examined the patient. I reviewed the resident's note and I agree with the resident's findings and plan as documented in the resident's note.  Patient feels well this morning with no new complaints. He does have persistent pain in the sacral area at the site of his sacral decubitus. Patient was admitted with increased weakness likely secondary to sepsis from urinary source. Urine culture growing greater than 100,000 gram-negative rods and blood cultures were positive for Escherichia coli. We will follow-up the sensitivities. Continue with ceftriaxone for now. Leukocytosis is slowly improving. PT eval appreciated. Patient will need placement in SNF for rehabilitation on discharge. Patient was noted to have an episode of hypoglycemia overnight. We'll decrease his home Lantus to 10 units and discontinue scheduled NovoLog with meals but continue with sliding scale insulin. No further workup for now.

## 2017-04-23 NOTE — Progress Notes (Signed)
   Subjective: [name] is doing well this AM. He is having significant pain in his sacral area, and denies any recent falls/trauma. Discussed that he does have Tylenol ordered and he may ask for it if the pain becomes worse. He denies any fevers, chills, palpitations, abdominal pain, dysuria. Discussed the plan of waiting on culture sensitivities, and getting PT/OT evaluation. He is agreeable with no questions or concerns.   He is very independent and lives on his own. States he is able to perform all his ADLs. Acknowledges generalized weakness. Agreeable with the plan for PT/OT evaluation.   Objective: Vital signs in last 24 hours: Vitals:   04/22/17 0500 04/22/17 1100 04/22/17 2100 04/23/17 0447  BP: (!) 90/42 (!) 94/50 (!) 109/36 (!) 108/47  Pulse: 75 77 68 73  Resp:  16 18 17   Temp:  98.8 F (37.1 C) 98.1 F (36.7 C) 99 F (37.2 C)  TempSrc:  Oral Oral Oral  SpO2:  98% 100% 100%  Weight:   127 lb (57.6 kg)   Height:       Physical Exam  Constitutional: He is oriented to person, place, and time.  Frail, elderly appearing gentleman  HENT:  Head: Normocephalic and atraumatic.  Eyes: Pupils are equal, round, and reactive to light. Conjunctivae are normal.  Cardiovascular: Normal rate, regular rhythm, normal heart sounds and intact distal pulses.   Pulmonary/Chest: Effort normal and breath sounds normal. He has no wheezes. He has no rales.  Abdominal: Soft. Bowel sounds are normal. There is no tenderness. There is no rebound and no guarding.  Musculoskeletal: He exhibits no edema.  Appears to have contractures in the UEs and LEs  Neurological: He is alert and oriented to person, place, and time.   Assessment/Plan: Principal Problem:   Bacteremia due to Escherichia coli Active Problems:   Type 2 diabetes mellitus with peripheral vascular disease ([place])   Urinary retention   Urinary tract infection   Elevated troponin   Elevated lactic acid level   Orthostatic hypotension  Hyponatremia   Hyperglycemia   Pressure injury of skin   Sacral pressure ulcer   Weakness  1. E. Coli Bacteremia 2/2 UTI - Possibly due to stage II sacral pressure ulcer and bowel incontinence  - Lactate trending down from 2.17 -> 2.0 - Leukocytosis improving  - Culture sensitivities pending, continue ceftriaxone   2. UTI - Urine cultures + E. Coli  - Sensitivities pending, continue ceftriaxone   3. Generalized Weakness - States he is independent with ADLs - Has sacral ulcer  - PT/OT evaluation, will likely need placement   4. Poorly controlled IDDM - A1c 9.7 in 10/2015  - Repeat A1c pending  - Episode of hypoglycemia overnight  - Continue home Lantus 15 units HS - Will discontinue Novolog 2 units TID, continue SSI-S  5. AKI on [name]  - Baseline Cr ~2.0 - 2.10 - Cr 2.59 this AM, continue to trend  6. Hyponatremia  - Na 133 this AM  - Continue to monitor   Dispo: Anticipated discharge in approximately >1 day(s).   Ina Homes, MD 04/23/2017, 7:47 AM My Pager: [phone]

## 2017-04-23 NOTE — Progress Notes (Signed)
Initial Nutrition Assessment  DOCUMENTATION CODES:   Underweight, Severe malnutrition in context of chronic illness  INTERVENTION:   -Provide Glucerna Shake po BID, each supplement provides 220 kcal and 10 grams of protein -Encourage PO intake -RD will continue to monitor for needs  NUTRITION DIAGNOSIS:   Malnutrition (severe) related to chronic illness, wound healing (diabetes) as evidenced by percent weight loss, moderate depletion of body fat, severe depletion of muscle mass.  GOAL:   Patient will meet greater than or equal to 90% of their needs  MONITOR:   PO intake, Supplement acceptance, Labs, Weight trends, Skin, I & O's  REASON FOR ASSESSMENT:   Malnutrition Screening Tool    ASSESSMENT:   83yo man with PMH of ICM, CHF with ICD placement, CAD, [name] stage 3, Cachexia suspected for malignancy, DM2 who presented with acute weakness.  Further symptoms include some mild dizziness.  He denied any SOB, chest pain, vision changes.  He has had a headache and some urinary complaints including increased frequency and one episode of incontinence.  He has had no dysuria.  He is very reticent and requests to go home, he would not give [place] much more history than this.    Patient in room eating lunch meal of pot roast, mashed potatoes and applesauce. Pt states he eats well at home and likes to cook for himself. Pt states he ate a good breakfast this morning but could not recall what he had. Pt denies swallowing or chewing difficulties. Pt states he drinks 1 Boost daily at home. Encouraged pt to consume more than 1 supplement a day given his increased protein needs. Will switch to Glucerna shakes BID.   Per chart review, pt has lost 48 lb since 04/17/16 (25% wt loss x 1 year which is significant for time frame). Nutrition-Focused physical exam completed. Findings are moderate fat depletion, severe muscle depletion, and no edema.   Medications: Colace capsule BID, Protonix tablet daily,  Miralax packet daily, Senokot-S tablet daily Labs reviewed: CBGs: 77-112 Low Na GFR: 25 HgbA1c: 12.1  Diet Order:  Diet Heart Room service appropriate? Yes; Fluid consistency: Thin  Skin:  Wound (see comment) (Pressure injury: Stage 2 sacrum)  Last BM:  PTA  Height:   Ht Readings from Last 1 Encounters:  04/22/17 5\' 10"  (1.778 m)    Weight:   Wt Readings from Last 1 Encounters:  04/22/17 127 lb (57.6 kg)    Ideal Body Weight:  75.5 kg  BMI:  Body mass index is 18.22 kg/m.  Estimated Nutritional Needs:   Kcal:  1700-1900  Protein:  80-90g  Fluid:  1.9L/day  EDUCATION NEEDS:   No education needs identified at this time  Clayton Bibles, MS, RD, LDN Pager: [phone] After Hours Pager: [phone]

## 2017-04-23 NOTE — Evaluation (Signed)
Occupational Therapy Evaluation Patient Details Name: [name] MRN: [id number] DOB: Nov 30, 1930 Today's Date: 04/23/2017    History of Present Illness 83 y/o male admitted with bacteremia due to e. coli after having fall at home due to weakness.  Pt is awaiting CT of pelvis due to suspected malignancy. PMH: DM, CAD/MI, CHF, PVD, CHF   Clinical Impression   PTA, pt reports that he was living alone and was independent. Pt currently requiring Max A for LB ADLs and toileting. Pt would benefit form acute OT to increase occupational performance and facilitate safe dc. Recommend dc to SNF for further OT to increase pt safety and independence with ADLs and functional mobility.     Follow Up Recommendations  SNF;Supervision/Assistance - 24 hour    Equipment Recommendations  Other (comment) (Defer to next venue)    Recommendations for Other Services PT consult     Precautions / Restrictions Precautions Precautions: Fall Restrictions Weight Bearing Restrictions: No      Mobility Bed Mobility Overal bed mobility: Needs Assistance Bed Mobility: Supine to Sit     Supine to sit: Mod assist;+2 for physical assistance     General bed mobility comments: Pt pulled on 1 therapist arm to stabilize and then also had assistant with bed pad to get hips turned. Pt unaware that he had BM in the bed.  Transfers Overall transfer level: Needs assistance   Transfers: Sit to/from Stand;Stand Pivot Transfers Sit to Stand: Min assist;+2 physical assistance Stand pivot transfers: Min assist;+2 physical assistance       General transfer comment: Flexed posture with use of RW    Balance Overall balance assessment: History of Falls;Needs assistance Sitting-balance support: No upper extremity supported;Feet supported Sitting balance-Leahy Scale: Fair     Standing balance support: During functional activity;Single extremity supported Standing balance-Leahy Scale: Poor Standing balance  comment: Pt able to briefly stand with unilateral support.                           ADL either performed or assessed with clinical judgement   ADL Overall ADL's : Needs assistance/impaired Eating/Feeding: Set up;Sitting   Grooming: Set up;Sitting   Upper Body Bathing: Minimal assistance;Sitting   Lower Body Bathing: Maximal assistance;Sit to/from stand   Upper Body Dressing : Minimal assistance;Sitting Upper Body Dressing Details (indicate cue type and reason): don new gown Lower Body Dressing: Maximal assistance;Sit to/from stand   Toilet Transfer: Minimal assistance;+2 for physical assistance;Stand-pivot;RW (Simuated to recliner)   Toileting- Clothing Manipulation and Hygiene: Maximal assistance;Sit to/from stand;Cueing for sequencing Toileting - Clothing Manipulation Details (indicate cue type and reason): Max A for peri care and cleaning after BM.      Functional mobility during ADLs: Minimal assistance;Rolling walker;+2 for physical assistance General ADL Comments: Pt performing LB ADLs and toileting at Max A level. Pt demonstrating decreased functional performance from reported baseline of independent.      Vision         Perception     Praxis      Pertinent Vitals/Pain Pain Assessment: Faces Faces Pain Scale: Hurts little more Pain Location: Buttocks Pain Descriptors / Indicators: Constant;Grimacing Pain Intervention(s): Monitored during session;Repositioned     Hand Dominance Right   Extremity/Trunk Assessment Upper Extremity Assessment Upper Extremity Assessment: Generalized weakness   Lower Extremity Assessment Lower Extremity Assessment: Defer to PT evaluation   Cervical / Trunk Assessment Cervical / Trunk Assessment: Normal   Communication Communication Communication: [facility]  Cognition Arousal/Alertness: Awake/alert Behavior During Therapy: WFL for tasks assessed/performed Overall Cognitive Status: Within Functional Limits for tasks  assessed                                 General Comments: Unsure of cognition and no family/friends present to confirm baseline status. Pt unaware that he soiled himself in bed (stool).    General Comments  Pt incontinent of stool in bed and also in standing. Pt also with urinary incontience in standing (condom cath had fallen off). BP 82/56 supine; 73/43 sitting eob; 85/39 standing    Exercises     Shoulder Instructions      Home Living Family/patient expects to be discharged to:: Private residence Living Arrangements: Alone   Type of Home: Apartment Home Access: Ramped entrance     Home Layout: One level     Bathroom Shower/Tub: Tub/shower unit;Curtain   Biochemist, clinical: Standard     Home Equipment: Hand held shower head (Rollator)          Prior Functioning/Environment Level of Independence: Independent        Comments: Reports performing ADLs and IADLs        OT Problem List: Decreased strength;Decreased range of motion;Decreased activity tolerance;Impaired balance (sitting and/or standing);Decreased cognition;Decreased safety awareness;Decreased knowledge of use of DME or AE      OT Treatment/Interventions: Self-care/ADL training;Therapeutic exercise;Energy conservation;DME and/or AE instruction;Therapeutic activities;Patient/family education    OT Goals(Current goals can be found in the care plan section) Acute Rehab OT Goals Patient Stated Goal: Pt agreeable to go to rehab OT Goal Formulation: With patient Time For Goal Achievement: 05/07/17 Potential to Achieve Goals: Good ADL Goals Pt Will Perform Grooming: with min guard assist;standing Pt Will Perform Lower Body Bathing: with min guard assist;sit to/from stand Pt Will Perform Lower Body Dressing: with min guard assist;sit to/from stand Pt Will Transfer to Toilet: with min guard assist;bedside commode;ambulating Pt Will Perform Toileting - Clothing Manipulation and hygiene: with min  guard assist;sit to/from stand  OT Frequency: Min 2X/week   Barriers to D/C:            Co-evaluation PT/OT/SLP Co-Evaluation/Treatment: Yes Reason for Co-Treatment: For patient/therapist safety PT goals addressed during session: Mobility/safety with mobility OT goals addressed during session: ADL's and self-care      AM-PAC PT "6 Clicks" Daily Activity     Outcome Measure Help from another person eating meals?: None Help from another person taking care of personal grooming?: A Little Help from another person toileting, which includes using toliet, bedpan, or urinal?: A Lot Help from another person bathing (including washing, rinsing, drying)?: A Lot Help from another person to put on and taking off regular upper body clothing?: A Little Help from another person to put on and taking off regular lower body clothing?: A Lot 6 Click Score: 16   End of Session Equipment Utilized During Treatment: Rolling walker Nurse Communication: Mobility status;Other (comment) (Cath off. incontinence)  Activity Tolerance: Patient tolerated treatment well Patient left: in chair;with call bell/phone within reach;with chair alarm set  OT Visit Diagnosis: Unsteadiness on feet (R26.81);Other abnormalities of gait and mobility (R26.89);Muscle weakness (generalized) (M62.81);Pain                Time: 5397-6734 OT Time Calculation (min): 39 min Charges:  OT General Charges $OT Visit: 1 Procedure OT Evaluation $OT Eval Moderate Complexity: 1 Procedure OT Treatments $Self Care/Home Management :  8-22 mins G-Codes:     [place], OTR/L Acute Rehab Pager: [phone] Office: [place] 04/23/2017, 3:46 PM

## 2017-04-23 NOTE — Consult Note (Addendum)
[employer] Nurse wound consult note Reason for Consult: Consult requested for sacrum.  Pt states this location was sore prior to admission. He is very thin with a protruding bone in this site. Wound type: Sacrum/gluteal fold with white macerated skin and partial thickness fissure; 1X.1X.1cm.  This is NOT a pressure injury; appearance is consistent with moisture associated skin damage.  Pt is frequently incontinent of stool and urine; it is difficult to keep the affected area from becoming soiled. He has a condom cath in place and diarrhea was cleaned off his skin; it is painful to touch.. Pressure Injury POA: This is a partial thickness fissure that was present on admission, NOT a pressure injury. Drainage (amount, consistency, odor) Small amt tan drainage  Dressing procedure/placement/frequency: Foam dressing to protect and promote healing. Please re-consult if further assistance is needed.  Thank-you,  Julien Girt MSN, [place], [place], [place], [place]

## 2017-04-24 LAB — CBC
HCT: 33.5 % — ABNORMAL LOW (ref 39.0–52.0)
Hemoglobin: 11.3 g/dL — ABNORMAL LOW (ref 13.0–17.0)
MCH: 29.4 pg (ref 26.0–34.0)
MCHC: 33.7 g/dL (ref 30.0–36.0)
MCV: 87 fL (ref 78.0–100.0)
Platelets: 145 10*3/uL — ABNORMAL LOW (ref 150–400)
RBC: 3.85 MIL/uL — ABNORMAL LOW (ref 4.22–5.81)
RDW: 13.7 % (ref 11.5–15.5)
WBC: 11 10*3/uL — ABNORMAL HIGH (ref 4.0–10.5)

## 2017-04-24 LAB — URINE CULTURE

## 2017-04-24 LAB — GLUCOSE, CAPILLARY
GLUCOSE-CAPILLARY: 199 mg/dL — AB (ref 65–99)
GLUCOSE-CAPILLARY: 210 mg/dL — AB (ref 65–99)
Glucose-Capillary: 130 mg/dL — ABNORMAL HIGH (ref 65–99)
Glucose-Capillary: 231 mg/dL — ABNORMAL HIGH (ref 65–99)

## 2017-04-24 LAB — BASIC METABOLIC PANEL
ANION GAP: 6 (ref 5–15)
BUN: 56 mg/dL — ABNORMAL HIGH (ref 6–20)
CO2: 27 mmol/L (ref 22–32)
Calcium: 8.5 mg/dL — ABNORMAL LOW (ref 8.9–10.3)
Chloride: 98 mmol/L — ABNORMAL LOW (ref 101–111)
Creatinine, Ser: 2.1 mg/dL — ABNORMAL HIGH (ref 0.61–1.24)
GFR calc non Af Amer: 27 mL/min — ABNORMAL LOW (ref >60)
GFR, EST AFRICAN AMERICAN: 32 mL/min — AB (ref >60)
Glucose, Bld: 199 mg/dL — ABNORMAL HIGH (ref 65–99)
POTASSIUM: 4.3 mmol/L (ref 3.5–5.1)
Sodium: 131 mmol/L — ABNORMAL LOW (ref 135–145)

## 2017-04-24 LAB — CULTURE, BLOOD (ROUTINE X 2)
Special Requests: ADEQUATE
Special Requests: ADEQUATE

## 2017-04-24 NOTE — Discharge Summary (Signed)
Name: BREYDON SENTERS MRN: [id number] DOB: [birth year] 83 y.o. PCP: Axel Filler, MD  Date of Admission: 04/21/2017  8:25 PM Date of Discharge: 04/25/2017 Attending Physician: Sid Falcon, MD  Discharge Diagnosis: 1. E. Coli Bacteremia 2/2 UTI. 2. UTI. 3. Sacral Ulcer. 4. Generalized Weakness. 5. [name]. 6. Insulin-Dependent Diabetes Mellitus.  Principal Problem:   Bacteremia due to Escherichia coli Active Problems:   Type 2 diabetes mellitus with peripheral vascular disease ([place])   Urinary retention   Urinary tract infection   Pressure injury of skin   Sacral pressure ulcer   Weakness  Discharge Medications: Allergies as of 04/25/2017   No Known Allergies     Medication List    STOP taking these medications   docusate sodium 100 MG capsule Commonly known as:  COLACE     TAKE these medications   ACCU-CHEK AVIVA PLUS test strip Generic drug:  glucose blood USE TO CHECK BLOOD SUGARS THREE TIMES DAILY TO FOUR TIMES DAILY   aspirin EC 81 MG tablet Take 81 mg by mouth daily.   atorvastatin 40 MG tablet Commonly known as:  LIPITOR TAKE 1 TABLET(40 MG) BY MOUTH DAILY AT 6 PM   carvedilol 12.5 MG tablet Commonly known as:  COREG TAKE 1 TABLET(12.5 MG) BY MOUTH TWICE DAILY   cefpodoxime 200 MG tablet Commonly known as:  VANTIN Take 1 tablet (200 mg total) by mouth daily.   fluorometholone 0.1 % ophthalmic suspension Commonly known as:  FML Place 2 drops into both eyes daily.   furosemide 40 MG tablet Commonly known as:  LASIX Take 1 tablet (40 mg total) by mouth 2 (two) times daily.   Insulin Glargine 100 UNIT/ML Solostar Pen Commonly known as:  LANTUS SOLOSTAR Inject 15 Units into the skin at bedtime. What changed:  how much to take   lactose free nutrition Liqd Take 237 mLs by mouth daily.   pantoprazole 40 MG tablet Commonly known as:  PROTONIX TAKE 1 TABLET(40 MG) BY MOUTH DAILY   polyethylene glycol packet Commonly known as:  MIRALAX  / GLYCOLAX Take 17 g by mouth daily. What changed:  Another medication with the same name was removed. Continue taking this medication, and follow the directions you see here.   senna-docusate 8.6-50 MG tablet Commonly known as:  Senokot-S Take 2 tablets by mouth daily.   tamsulosin 0.4 MG Caps capsule Commonly known as:  FLOMAX Take 1 capsule (0.4 mg total) by mouth daily. What changed:  when to take this   VENTOLIN HFA 108 (90 Base) MCG/ACT inhaler Generic drug:  albuterol INHALE 2 PUFFS INTO THE LUNGS EVERY 6 HOURS AS NEEDED FOR WHEEZING OR SHORTNESS OF BREATH       Disposition and follow-up:   Mr.Ceasar E Zaugg was discharged from [facility] in Stable condition.  At the hospital follow up visit please address:  1.  Please adjust Mr. Williamsen insulin regimen. He will likely need prandial insulin and an increase in basal insulin.  Please ensure he continues to take his antibiotics until 05/06/2017.  Please ensure his sacral ulcer is kept clean and dry: Dressing procedure/placement/frequency- Foam dressing to protect and promote healing..  Please ensure he makes his follow up appointment on 05/02/2017    2.  Labs / imaging needed at time of follow-up: N/A  3.  Pending labs/ test needing follow-up: N/A  Follow-up Appointments:   Hospital Course by problem list: Principal Problem:   Bacteremia due to Escherichia coli Active  Problems:   Type 2 diabetes mellitus with peripheral vascular disease ([facility])   Urinary retention   Urinary tract infection   Pressure injury of skin   Sacral pressure ulcer   Weakness   1. E. Coli Bacteremia 2/2 UTI. Mr. Rosas is a 82 y.o. Male with a PMHx significant for HFrEF, [name] Stage 3, and uncontrolled DM who presented to the ED with generalized weakness and dizziness of 1 days duration. In the ED he was found to be febrile and have a leukocytosis. UA revealed numerous WBC, ketones, and bacteria. Cultures subsequently grew E.  Coli. Blood cultures were obtained and he was started on Ceftriaxone. Blood cultures grew E. Coli as well. His leukocytosis trended down and he remained afebrile for the remained of his hospitalization. Sensitivities illustrated pan-sensitive E. Coli with the exception of lower sensitivity to amoxicillin. He received 3 days of Ceftriaxone before being switched to Cefpodoxime 200mg  daily for an additional 11 days on discharge (a total of 14 days of antibiotic therapy).   2. UTI. On presentation Mr. Cannedy' UA was found to have numerous WBCs, ketones, and multiple bacteria. Cultures subsequently grew E. Coli, that was pan-sensitive with the exception of lower sensitivity to amoxicillin. He received 3 days of Ceftriaxone before being switched to Cefpodoxime 200mg  daily for an additional 11 days (a total of 14 days of antibiotic therapy).   3. Sacral Ulcer. Mr. Huezo had a sacral wound on presentation that was causing a lot of discomfort. Wound care was consulted. They did not think this is a pressure ulcer, but is more consistent with maceration due to incontinence of bowel and bladder. They recommended frequent dressing changes and further care post-discharge.  4. Generalized Weakness. Mr. Earhart presented with generalized weakness of approximately 1 days duration. He states that he was independent in all ADLs prior to admission; however, his sacral ulcer and low muscle mass would argue against this. PT/OT was consulted. They recommended SNF placement due to safety concerns with ambulation.    5. [name]. Mr. Osuna has Stage 3 [name]. On presentation his creatinine was stable and consistent with baseline. He was given IVF. We recommend continued follow-up and avoidance of nephrotoxic drugs.   6. Insulin-Dependent Diabetes Mellitus. Mr. Buffa' A1c was >12 on presentation. His home regimen includes Lantus 20 units QHS. This will need to be increase and prandial insulin will likely need to be added.   Discharge  Vitals:   BP (!) 126/52 (BP Location: Right Arm)   Pulse 66   Temp 97.7 F (36.5 C) (Oral)   Resp 18   Ht 5\' 10"  (1.778 m)   Wt 126 lb (57.2 kg)   SpO2 100%   BMI 18.08 kg/m   Pertinent Labs, Studies, and Procedures:  Ct Head W/Out Contrast  IMPRESSION: No acute intracranial abnormality.  Moderate brain parenchymal atrophy and chronic microvascular disease.  Chronic hyperdense appearance of the dura, which likely represents dural calcifications. Subtle subarachnoid hemorrhage would be difficult to exclude in this setting.  CT Chest W/Out Contrast & CT Abdomen Pelvis W/Out Contrast IMPRESSION: 1. Mild bibasilar collapse/consolidation in the lower lobes with tiny bilateral pleural effusions. 2. Stable appearance probable cyst left kidney. 3. Potential circumferential wall thickening distal rectum, not well evaluated on this noncontrast study. 4. Small volume ascites with diffuse body wall edema. 5.  Emphysema. (ICD10-J43.9) 6.  Aortic Atherosclerois (ICD10-170.0)  Discharge Instructions: Discharge Instructions    Diet - low sodium heart healthy    Complete by:  As  directed    Discharge instructions    Complete by:  As directed    -Please continue antibiotics- Cefpodoxime/Vantin 200 mg daily until 05/06/2017 for a total of 14 day antibiotic course.  -Please keep his sacral wound clean and dry with appropriate dressings  -If able, please adjust Mr. Mehra insulin regimen according to blood sugars. He will likely need an increase of his Lantus insulin and additional prandial insulin for better diabetes control -Follow up with PCP, Dr. Evette Doffing, at the [place] on Aug 8th at 10:45 am   Increase activity slowly    Complete by:  As directed       Signed: Tawny Asal, MD 04/25/2017, 9:51 AM   My Pager: [phone]

## 2017-04-24 NOTE — Progress Notes (Signed)
   Subjective: [name] is doing well over the interval. Voicing concerns about the "knot" and pain on his buttock. Discussed that wound care would be by to continue treatment and it would likely hurt until it is healed. Discussed the need for rehab at a SNF, he is agreeable but states he needs to pay bills at his house. No questions or concerns today.   Objective: Vital signs in last 24 hours: Vitals:   04/23/17 1741 04/23/17 2041 04/24/17 0604 04/24/17 0914  BP: (!) 119/59 (!) 107/41 (!) 109/54 (!) 94/44  Pulse: 91 66 67 65  Resp: 18 18 18 18   Temp: 98.5 F (36.9 C) 98.3 F (36.8 C) 98.1 F (36.7 C) 98.4 F (36.9 C)  TempSrc: Oral Oral Oral Oral  SpO2: 100% 98% 97% 99%  Weight:  128 lb (58.1 kg)    Height:       Physical Exam  Constitutional: He is oriented to person, place, and time.  HENT:  Head: Normocephalic and atraumatic.  Eyes: Pupils are equal, round, and reactive to light. Conjunctivae are normal.  Cardiovascular: Intact distal pulses.   Difficult to auscultate, no apparent murmurs or rubs  Pulmonary/Chest: Effort normal and breath sounds normal. He has no wheezes. He has no rales.  Abdominal: Soft. Bowel sounds are normal. He exhibits no distension. There is no tenderness.  Musculoskeletal: He exhibits no edema.  Neurological: He is alert and oriented to person, place, and time.  Skin: Skin is warm and dry.   Assessment/Plan: Principal Problem:   Bacteremia due to Escherichia coli Active Problems:   Type 2 diabetes mellitus with peripheral vascular disease ([place])   Urinary retention   Urinary tract infection   Elevated troponin   Elevated lactic acid level   Orthostatic hypotension   Hyponatremia   Hyperglycemia   Pressure injury of skin   Sacral pressure ulcer   Weakness  1. E. Coli Bacteremia 2/2 UTI - Possibly due to stage II sacral pressure ulcer and bowel incontinence  - Leukocytosis improving, continue trend  - E. Coli pan-sensitive with  exception to Ampicillin  - Continue Ceftriaxone for time being   - Will start oral therapy with Keflex 250mg  BID on discharge   2. UTI - E. Coli pan-sensitive with exception to Ampicillin  - Continue Ceftriaxone for time being   - Will start oral therapy with Keflex 250mg  BID on discharge   3. Sacral Ulcer  - Wound care does not think this is a pressure ulcer, more consistent with maceration due to incontinence of bowel and bladder  - Cleaned and dressed  - Will need dressing and care on discharge   4. Generalized Weakness - States he is independent with ADLs - Has sacral ulcer  - PT/OT evaluation, will likely need placement   5. Poorly controlled IDDM - Continue Lantus 10 units HS - Continue SSI-S  6. [name] - Avoid nephrotoxic drugs - Continue to monitor Cr  Dispo: Anticipated discharge in approximately 0-1 day(s).   Robert Homes, MD 04/24/2017, 11:12 AM My Pager: [phone]

## 2017-04-24 NOTE — NC FL2 (Signed)
[place] LEVEL OF CARE SCREENING TOOL     IDENTIFICATION  Patient Name: [name] Birthdate: [birth year] Sex: male Admission Date (Current Location): 04/21/2017  [place] and [place] Number:  Kathleen Argue [id number] [place] and Address:  [facility] [place] [address], [place], [place] [address]      Provider Number: [id number]  Attending Physician Name and Address:  Sid Falcon, MD  Relative Name and Phone Number:  [name] [phone], V[place] Brother [phone]     Current Level of Care: Hospital Recommended Level of Care: [place] Prior Approval Number:    Date Approved/Denied:   PASRR Number: [id number] (Eff. 11/17/15)  Discharge Plan: SNF    Current Diagnoses: Patient Active Problem List   Diagnosis Date Noted  . Bacteremia due to Escherichia coli 04/23/2017  . Elevated troponin 04/22/2017  . Pressure injury of skin 04/22/2017  . Sacral pressure ulcer 04/22/2017  . Weakness   . Urinary tract infection 04/21/2017  . Weight loss 12/11/2016  . Advanced care planning/counseling discussion 12/11/2016  . Urinary retention 01/13/2016  . Acute kidney injury ([place])   . Healthcare maintenance 11/22/2015  . [name] (chronic kidney disease) stage 3, GFR 30-59 ml/min 12/04/2014  . Automatic implantable cardioverter-defibrillator in situ 01/16/2012  . Coronary atherosclerosis 12/25/2007  . Chronic systolic congestive heart failure ([place]) 12/25/2007  . Hyperlipidemia 01/02/2007  . Type 2 diabetes mellitus with peripheral vascular disease ([place]) 01/01/1989    Orientation RESPIRATION BLADDER Height & Weight     Self, Time, Place  Normal Incontinent, External catheter (Catheter placed 04/23/17) Weight: 128 lb (58.1 kg) Height:  5\' 10"  (177.8 cm)  BEHAVIORAL SYMPTOMS/MOOD NEUROLOGICAL BOWEL NUTRITION STATUS      Incontinent Diet (Heart healthy)  AMBULATORY STATUS COMMUNICATION OF NEEDS Skin    Total Care (Ambulation deferred during PT eval on 7/30) Verbally Other (Comment) (Pressure injury to mid sacrum, covered with foam mepilex )                       Personal Care Assistance Level of Assistance  Bathing, Feeding, Dressing Bathing Assistance: Maximum assistance (upper body-min assist & lower body mod assist) Feeding assistance: Limited assistance (Needs assistance with set-up) Dressing Assistance: Maximum assistance (upper body-min assist & lower body-mod assist)     Functional Limitations Info  Sight, Hearing, Speech Sight Info: Adequate Hearing Info: Adequate Speech Info: Adequate    SPECIAL CARE FACTORS FREQUENCY  PT (By licensed PT), OT (By licensed OT)     PT Frequency: Evaluated 7/30 and a minimum of 3X per week therapy recommended OT Frequency: Evaluated 7/30 and a minimum of 2X per week therapy recommended            Contractures Contractures Info: Not present    Additional Factors Info  Code Status, Allergies, Insulin Sliding Scale Code Status Info: Full Allergies Info: No known allergies   Insulin Sliding Scale Info: 0-9 Units 3X a day with meals       Current Medications (04/24/2017):  This is the current hospital active medication list Current Facility-Administered Medications  Medication Dose Route Frequency Provider Last Rate Last Dose  . acetaminophen (TYLENOL) tablet 650 mg  650 mg Oral Q6H PRN Ledell Noss, MD   650 mg at 04/24/17 3403   Or  . acetaminophen (TYLENOL) suppository 650 mg  650 mg Rectal Q6H PRN Ledell Noss, MD      . aspirin EC tablet 81 mg  81 mg  Oral Daily Ledell Noss, MD   81 mg at 04/24/17 0856  . atorvastatin (LIPITOR) tablet 40 mg  40 mg Oral q1800 Ledell Noss, MD   40 mg at 04/23/17 1806  . carvedilol (COREG) tablet 12.5 mg  12.5 mg Oral BID WC Ledell Noss, MD   12.5 mg at 04/24/17 0856  . cefTRIAXone (ROCEPHIN) 2 g in dextrose 5 % 50 mL IVPB  2 g Intravenous Q24H [name], [facility]   Stopped at 04/24/17 0104  .  docusate sodium (COLACE) capsule 100 mg  100 mg Oral Q12H Ledell Noss, MD   100 mg at 04/24/17 0856  . feeding supplement (ENSURE ENLIVE) (ENSURE ENLIVE) liquid 237 mL  237 mL Oral Daily Ledell Noss, MD   237 mL at 04/24/17 1134  . feeding supplement (GLUCERNA SHAKE) (GLUCERNA SHAKE) liquid 237 mL  237 mL Oral BID BM Gilles Chiquito B, MD   237 mL at 04/24/17 1134  . fluorometholone (FML) 0.1 % ophthalmic suspension 2 drop  2 drop Both Eyes Daily Ledell Noss, MD   2 drop at 04/24/17 1134  . heparin injection 5,000 Units  5,000 Units Subcutaneous Q8H Ledell Noss, MD   5,000 Units at 04/24/17 [phone]  . insulin aspart (novoLOG) injection 0-9 Units  0-9 Units Subcutaneous TID WC Ledell Noss, MD   2 Units at 04/24/17 1302  . insulin glargine (LANTUS) injection 10 Units  10 Units Subcutaneous QHS Ina Homes, MD   10 Units at 04/23/17 2131  . MEDLINE mouth rinse  15 mL Mouth Rinse BID Gilles Chiquito B, MD   15 mL at 04/24/17 1135  . pantoprazole (PROTONIX) EC tablet 40 mg  40 mg Oral Daily Ledell Noss, MD   40 mg at 04/24/17 0856  . polyethylene glycol (MIRALAX / GLYCOLAX) packet 17 g  17 g Oral Daily Ledell Noss, MD   17 g at 04/24/17 0855  . senna-docusate (Senokot-S) tablet 2 tablet  2 tablet Oral Daily Ledell Noss, MD   2 tablet at 04/24/17 [phone]  . sodium chloride 0.9 % bolus 250 mL  250 mL Intravenous Once Shela Leff, MD      . sodium chloride 0.9 % bolus 500 mL  500 mL Intravenous Once Shela Leff, MD         Discharge Medications: Please see discharge summary for a list of discharge medications.  Relevant Imaging Results:  Relevant Lab Results:   Additional Information ss#[id number].  Sable Feil, LCSW

## 2017-04-24 NOTE — Progress Notes (Signed)
Internal Medicine Attending:   I saw and examined the patient. I reviewed the resident's note and I agree with the resident's findings and plan as documented in the resident's note.  Patient feels well today with no new complaints. He does have persistent pain over his his sacral area where he has his wound. Wound care follow-up and recommendations appreciated. Continue with dressings per wound care. Patient was initially admitted for worsening weakness and was found to have sepsis secondary to urinary source. His blood cultures and urine culture grew Escherichia coli which was sensitive to ceftriaxone. Patient was evaluated by PT and was recommended to go to SNF for rehabilitation. Resident discussed antibiotics with ID pharmacist and it was recommended that patient be discharged on oral Cefpodoxime to complete a 14 day course. No further workup for now. Patient is stable for discharge to SNF when bed is available.

## 2017-04-24 NOTE — Clinical Social Work Note (Signed)
Assessment completed with patient (full assessment to follow) today and clinical information transmitted to facilities in [facility]. Patient requesting [facility] for rehab. Facility contacted and can accept patient once insurance authorization received [facility]) CSW will send clinicals to [facility] this evening and follow-up with [facility] on Wednesday morning.  Charleton Deyoung Givens, MSW, LCSW Licensed Clinical Social Worker [place] [phone]

## 2017-04-25 ENCOUNTER — Encounter (HOSPITAL_COMMUNITY): Payer: Self-pay

## 2017-04-25 ENCOUNTER — Telehealth: Payer: Self-pay

## 2017-04-25 DIAGNOSIS — Z95810 Presence of automatic (implantable) cardiac defibrillator: Secondary | ICD-10-CM

## 2017-04-25 DIAGNOSIS — B999 Unspecified infectious disease: Secondary | ICD-10-CM | POA: Diagnosis not present

## 2017-04-25 DIAGNOSIS — K219 Gastro-esophageal reflux disease without esophagitis: Secondary | ICD-10-CM | POA: Diagnosis not present

## 2017-04-25 DIAGNOSIS — L98491 Non-pressure chronic ulcer of skin of other sites limited to breakdown of skin: Secondary | ICD-10-CM

## 2017-04-25 DIAGNOSIS — I4510 Unspecified right bundle-branch block: Secondary | ICD-10-CM

## 2017-04-25 DIAGNOSIS — B9620 Unspecified Escherichia coli [E. coli] as the cause of diseases classified elsewhere: Secondary | ICD-10-CM

## 2017-04-25 DIAGNOSIS — R339 Retention of urine, unspecified: Secondary | ICD-10-CM | POA: Diagnosis not present

## 2017-04-25 DIAGNOSIS — R2689 Other abnormalities of gait and mobility: Secondary | ICD-10-CM | POA: Diagnosis not present

## 2017-04-25 DIAGNOSIS — E11622 Type 2 diabetes mellitus with other skin ulcer: Secondary | ICD-10-CM | POA: Diagnosis not present

## 2017-04-25 DIAGNOSIS — I2510 Atherosclerotic heart disease of native coronary artery without angina pectoris: Secondary | ICD-10-CM

## 2017-04-25 DIAGNOSIS — N183 Chronic kidney disease, stage 3 (moderate): Secondary | ICD-10-CM

## 2017-04-25 DIAGNOSIS — L89323 Pressure ulcer of left buttock, stage 3: Secondary | ICD-10-CM | POA: Diagnosis not present

## 2017-04-25 DIAGNOSIS — E785 Hyperlipidemia, unspecified: Secondary | ICD-10-CM | POA: Diagnosis not present

## 2017-04-25 DIAGNOSIS — I255 Ischemic cardiomyopathy: Secondary | ICD-10-CM

## 2017-04-25 DIAGNOSIS — E1151 Type 2 diabetes mellitus with diabetic peripheral angiopathy without gangrene: Secondary | ICD-10-CM | POA: Diagnosis not present

## 2017-04-25 DIAGNOSIS — E119 Type 2 diabetes mellitus without complications: Secondary | ICD-10-CM | POA: Diagnosis not present

## 2017-04-25 DIAGNOSIS — E1122 Type 2 diabetes mellitus with diabetic chronic kidney disease: Secondary | ICD-10-CM | POA: Diagnosis not present

## 2017-04-25 DIAGNOSIS — N390 Urinary tract infection, site not specified: Secondary | ICD-10-CM | POA: Diagnosis not present

## 2017-04-25 DIAGNOSIS — M6281 Muscle weakness (generalized): Secondary | ICD-10-CM | POA: Diagnosis not present

## 2017-04-25 DIAGNOSIS — I10 Essential (primary) hypertension: Secondary | ICD-10-CM | POA: Diagnosis not present

## 2017-04-25 DIAGNOSIS — I509 Heart failure, unspecified: Secondary | ICD-10-CM

## 2017-04-25 DIAGNOSIS — R7881 Bacteremia: Secondary | ICD-10-CM | POA: Diagnosis not present

## 2017-04-25 DIAGNOSIS — E1165 Type 2 diabetes mellitus with hyperglycemia: Secondary | ICD-10-CM | POA: Diagnosis not present

## 2017-04-25 DIAGNOSIS — Z794 Long term (current) use of insulin: Secondary | ICD-10-CM

## 2017-04-25 DIAGNOSIS — N179 Acute kidney failure, unspecified: Secondary | ICD-10-CM | POA: Diagnosis not present

## 2017-04-25 LAB — BASIC METABOLIC PANEL
Anion gap: 7 (ref 5–15)
BUN: 56 mg/dL — ABNORMAL HIGH (ref 6–20)
CALCIUM: 8.6 mg/dL — AB (ref 8.9–10.3)
CO2: 26 mmol/L (ref 22–32)
CREATININE: 1.92 mg/dL — AB (ref 0.61–1.24)
Chloride: 98 mmol/L — ABNORMAL LOW (ref 101–111)
GFR calc non Af Amer: 30 mL/min — ABNORMAL LOW (ref >60)
GFR, EST AFRICAN AMERICAN: 35 mL/min — AB (ref >60)
GLUCOSE: 261 mg/dL — AB (ref 65–99)
Potassium: 4.4 mmol/L (ref 3.5–5.1)
Sodium: 131 mmol/L — ABNORMAL LOW (ref 135–145)

## 2017-04-25 LAB — GLUCOSE, CAPILLARY
GLUCOSE-CAPILLARY: 259 mg/dL — AB (ref 65–99)
Glucose-Capillary: 240 mg/dL — ABNORMAL HIGH (ref 65–99)
Glucose-Capillary: 302 mg/dL — ABNORMAL HIGH (ref 65–99)

## 2017-04-25 LAB — CBC
HCT: 34.9 % — ABNORMAL LOW (ref 39.0–52.0)
Hemoglobin: 11.8 g/dL — ABNORMAL LOW (ref 13.0–17.0)
MCH: 29.4 pg (ref 26.0–34.0)
MCHC: 33.8 g/dL (ref 30.0–36.0)
MCV: 86.8 fL (ref 78.0–100.0)
PLATELETS: 120 10*3/uL — AB (ref 150–400)
RBC: 4.02 MIL/uL — ABNORMAL LOW (ref 4.22–5.81)
RDW: 13.7 % (ref 11.5–15.5)
WBC: 10.5 10*3/uL (ref 4.0–10.5)

## 2017-04-25 MED ORDER — CEFPODOXIME PROXETIL 200 MG PO TABS
200.0000 mg | ORAL_TABLET | Freq: Every day | ORAL | Status: DC
  Administered 2017-04-25: 200 mg via ORAL
  Filled 2017-04-25 (×2): qty 1

## 2017-04-25 MED ORDER — CEFPODOXIME PROXETIL 200 MG PO TABS: 200.0000 mg | ORAL_TABLET | Freq: Every day | ORAL | 0 refills | Status: AC

## 2017-04-25 NOTE — Consult Note (Signed)
           [facility] CM Primary Care Navigator  04/25/2017  [name] Feb 05, 1931 [id number]   Martin Majestic tosee patient at the bedside yesterday afternoon to identify possible discharge needs but he was fast asleep and no family members seen in the room.  Will attempt to follow-upwith patient at another time once available.  Patient was admitted with bacteremia due to E. Coli after having fall at home related to weakness. Patient's chart reveals that he will be discharged to SNF (skilled nursing facility) for rehabilitation per PT/ OT recommendation. Inpatient social worker still in the process of arranging for skilled nursing facility for patient.  For questions, please contact:  Dannielle Huh, BSN, RN- [facility] Primary Care Navigator  Telephone: [phone] [place]

## 2017-04-25 NOTE — Care Management Note (Signed)
Case Management Note  Patient Details  Name: [name] MRN: [id number] Date of Birth: Nov 30, 1930  Subjective/Objective:  Pt admitted on 04/21/17 with E. Coli Bacteremia secondary to UTI.  PTA, pt resided at home alone; hx of fall at home due to weakness.                Action/Plan: PT/OT recommending SNF for short term rehab at discharge.  CSW consulted to facilitate discharge to SNF upon medical stability.    Expected Discharge Date:  04/25/17               Expected Discharge Plan:  Skilled Nursing Facility  In-House Referral:  Clinical Social Work  Discharge planning Services  CM Consult  Post Acute Care Choice:    Choice offered to:     DME Arranged:    DME Agency:     HH Arranged:    [place] Agency:     Status of Service:  Completed, signed off  If discussed at [employer], dates discussed:    Additional Comments:  04/25/17 J. Sinahi Knights, RN, BSN Pt medically stable for discharge today.  Plan dc to SNF, per CSW arrangements.   Reinaldo Raddle, RN, BSN  Trauma/Neuro ICU Case Manager [phone]

## 2017-04-25 NOTE — Progress Notes (Signed)
   Subjective: Mr. Corter reports continuing to feel improved since admission with only complaints of discomfort related to his sacral wound which requires frequent repositioning during the night. He expresses understanding and approval of the plan to discharge to a skilled nursing facility for extra assistance.   Objective: Vital signs in last 24 hours: Vitals:   04/24/17 0914 04/24/17 1649 04/24/17 2048 04/25/17 0604  BP: (!) 94/44 (!) 105/56 (!) 117/57 (!) 121/58  Pulse: 65 69 70 70  Resp: 18 17 17 17   Temp: 98.4 F (36.9 C) 98.3 F (36.8 C) 98.1 F (36.7 C) 98.4 F (36.9 C)  TempSrc: Oral Oral Oral Oral  SpO2: 99% 98% 99% 100%  Weight:   126 lb (57.2 kg)   Height:       Physical Exam  Constitutional: He is oriented to person, place, and time.  Elderly frail gentleman in no acute distress   HENT:  Head: Normocephalic and atraumatic.  Cardiovascular: Normal rate and intact distal pulses.   No apparent murmurs or rubs  Pulmonary/Chest: Effort normal. No respiratory distress.  Bibasilar crackles  Abdominal: Soft. He exhibits no distension. There is no tenderness.  Musculoskeletal: He exhibits no edema.  Neurological: He is alert and oriented to person, place, and time.  Skin: Skin is warm and dry.  Sacral dressing in place    Assessment/Plan: Principal Problem:   Bacteremia due to Escherichia coli Active Problems:   Type 2 diabetes mellitus with peripheral vascular disease ([place])   Urinary retention   Urinary tract infection   Pressure injury of skin   Sacral pressure ulcer   Weakness  1. E. Coli Bacteremia 2/2 UTI - UTI possibly due to stage II sacral pressure ulcer, bowel incontinence  - Leukocytosis improved, 10.5 today - E. Coli pan-sensitive with exception to Ampicillin  - Transition to PO Cefpodoxime 200 mg daily per ID pharmacy recs   2. UTI - E. Coli pan-sensitive with exception to Ampicillin  - PO Cefpodoxime as above   3. Sacral Ulcer  - Wound care  does not think this is a pressure ulcer, more consistent with maceration due to incontinence of bowel and bladder  - Cleaned and dressed  - Will need dressing and care on discharge at SNF   4. Generalized Weakness - States he is independent with ADLs - Has sacral ulcer  - PT/OT evaluation recommended DC to SNF   5. Poorly controlled IDDM - Continue Lantus 10 units HS - Continue SSI-S  6. [name] - Avoid nephrotoxic drugs - Continue to monitor Cr  Dispo: Anticipated discharge when SNF bed available   Tawny Asal, MD 04/25/2017, 9:15 AM My Pager: [phone]

## 2017-04-25 NOTE — Telephone Encounter (Signed)
Hospital TOC per Dr Juleen China, discharge 04/25/2017, appt 05/02/2017 @10 :45.

## 2017-04-25 NOTE — Clinical Social Work Placement (Addendum)
   CLINICAL SOCIAL WORK PLACEMENT  NOTE 04/25/17 - DISCHARGED TO [place] VIA AMBULANCE  Date:  04/25/2017  Patient Details  Name: [name] MRN: [id number] Date of Birth: Nov 26, 1930  Clinical Social Work is seeking post-discharge placement for this patient at the   level of care (*CSW will initial, date and re-position this form in  chart as items are completed):   No - Patient/family provided with [facility] Work Department's list of facilities offering this level of care within the geographic area requested by the patient (or if unable, by the patient's family). Patient provided CSW with facility preference   Yes - Patient/family informed of their freedom to choose among providers that offer the needed level of care, that participate in Medicare, Medicaid or managed care program needed by the patient, have an available bed and are willing to accept the patient.   No - Patient/family informed of [facility]'s ownership interest in [facility] and [facility], as well as of the fact that they are under no obligation to receive care at these facilities.  PASRR submitted to EDS on       PASRR number received on       Existing PASRR number confirmed on  04/24/17  FL2 transmitted to all facilities in geographic area requested by pt/family on  04/24/17     FL2 transmitted to all facilities within larger geographic area on       Patient informed that his/her managed care company has contracts with or will negotiate with certain facilities, including the following:         04/24/17 - Patient/family informed of bed offers received.  Patient chooses bed at  [facility]     Physician recommends and patient chooses bed at      Patient to be transferred to  [facility] on  04/25/17.  Patient to be transferred to facility by  ambulance     Patient family notified on  04/25/17 of transfer.  Name of family member notified:   Francis Gaines ([phone]), contacted at 5:36 pm regarding discharge and ambulance transport.     PHYSICIAN       Additional Comment:  04/25/17 - Received insurance authorization from [place] with [employer].  Authorization number - [id number], effective 8/1 - 8/8. Clinicals due 8/7 - faxed to [phone]. Juliann Pulse, facility liaison contacted and provided with authorization information.   _______________________________________________ Sable Feil, LCSW 04/25/2017, 1:55 PM

## 2017-04-25 NOTE — Progress Notes (Signed)
[name] to be D/C'd Skilled nursing facility per MD order.  Discussed prescriptions and follow up appointments with the patient. Prescriptions given to patient, medication list explained in detail. Pt verbalized understanding.  Allergies as of 04/25/2017   No Known Allergies     Medication List    STOP taking these medications   docusate sodium 100 MG capsule Commonly known as:  COLACE     TAKE these medications   ACCU-CHEK AVIVA PLUS test strip Generic drug:  glucose blood USE TO CHECK BLOOD SUGARS THREE TIMES DAILY TO FOUR TIMES DAILY   aspirin EC 81 MG tablet Take 81 mg by mouth daily.   atorvastatin 40 MG tablet Commonly known as:  LIPITOR TAKE 1 TABLET(40 MG) BY MOUTH DAILY AT 6 PM   carvedilol 12.5 MG tablet Commonly known as:  COREG TAKE 1 TABLET(12.5 MG) BY MOUTH TWICE DAILY   cefpodoxime 200 MG tablet Commonly known as:  VANTIN Take 1 tablet (200 mg total) by mouth daily.   fluorometholone 0.1 % ophthalmic suspension Commonly known as:  FML Place 2 drops into both eyes daily.   furosemide 40 MG tablet Commonly known as:  LASIX Take 1 tablet (40 mg total) by mouth 2 (two) times daily.   Insulin Glargine 100 UNIT/ML Solostar Pen Commonly known as:  LANTUS SOLOSTAR Inject 15 Units into the skin at bedtime. What changed:  how much to take   lactose free nutrition Liqd Take 237 mLs by mouth daily.   pantoprazole 40 MG tablet Commonly known as:  PROTONIX TAKE 1 TABLET(40 MG) BY MOUTH DAILY   polyethylene glycol packet Commonly known as:  MIRALAX / GLYCOLAX Take 17 g by mouth daily. What changed:  Another medication with the same name was removed. Continue taking this medication, and follow the directions you see here.   senna-docusate 8.6-50 MG tablet Commonly known as:  Senokot-S Take 2 tablets by mouth daily.   tamsulosin 0.4 MG Caps capsule Commonly known as:  FLOMAX Take 1 capsule (0.4 mg total) by mouth daily. What changed:  when to take  this   VENTOLIN HFA 108 (90 Base) MCG/ACT inhaler Generic drug:  albuterol INHALE 2 PUFFS INTO THE LUNGS EVERY 6 HOURS AS NEEDED FOR WHEEZING OR SHORTNESS OF BREATH       Vitals:   04/25/17 0900 04/25/17 1800  BP: (!) 126/52 125/62  Pulse: 66 66  Resp: 18 18  Temp: 97.7 F (36.5 C) 98.2 F (36.8 C)    Skin clean, dry and intact without evidence of skin break down, no evidence of skin tears noted. IV catheter discontinued intact. Site without signs and symptoms of complications. Dressing and pressure applied. Pt denies pain at this time. No complaints noted.  An After Visit Summary was printed and given to the patient. Patient escorted via stretcher, and D/C home via private PETAR to [employer].  Haywood Lasso BSN, RN [facility] Phone [phone]

## 2017-04-25 NOTE — Progress Notes (Signed)
Physical Therapy Treatment Patient Details Name: [name] MRN: [id number] DOB: July 07, 1931 Today's Date: 04/25/2017    History of Present Illness 83 y/o male admitted with bacteremia due to e. coli after having fall at home due to weakness.  Pt is awaiting CT of pelvis due to suspected malignancy. PMH: DM, CAD/MI, CHF, PVD, CHF    PT Comments    Pt scheduled to go to SNF today.  Pt with improved sit > stand, but declined ambulation.  Pt was appreaciative of standing therex.   Follow Up Recommendations  SNF;Supervision for mobility/OOB     Equipment Recommendations  None recommended by PT    Recommendations for Other Services       Precautions / Restrictions Precautions Precautions: Fall Restrictions Weight Bearing Restrictions: No    Mobility  Bed Mobility         Supine to sit: Min assist     General bed mobility comments: MIN A to get trunk upright  Transfers Overall transfer level: Needs assistance Equipment used: Rolling walker (2 wheeled) Transfers: Sit to/from Stand Sit to Stand: Min assist         General transfer comment: MIN A to power up  Ambulation/Gait             General Gait Details: Pt declined   Stairs            Wheelchair Mobility    Modified Rankin (Stroke Patients Only)       Balance   Sitting-balance support: No upper extremity supported;Feet supported Sitting balance-Leahy Scale: Good     Standing balance support: Bilateral upper extremity supported Standing balance-Leahy Scale: Poor Standing balance comment: Pt stood at [employer] for therex and standing tolerance                            Cognition Arousal/Alertness: Awake/alert Behavior During Therapy: WFL for tasks assessed/performed Overall Cognitive Status: Within Functional Limits for tasks assessed                                        Exercises General Exercises - Lower Extremity Hip Flexion/Marching:  Strengthening;Both;20 reps;Standing Heel Raises: Strengthening;10 reps;Standing Mini-Sqauts: Strengthening;20 reps;Standing    General Comments        Pertinent Vitals/Pain Pain Assessment: No/denies pain    Home Living                      Prior Function            PT Goals (current goals can now be found in the care plan section) Acute Rehab PT Goals Patient Stated Goal: Pt agreeable to go to rehab PT Goal Formulation: With patient Time For Goal Achievement: 05/07/17 Potential to Achieve Goals: Good Progress towards PT goals: Progressing toward goals    Frequency    Min 3X/week      PT Plan Current plan remains appropriate    Co-evaluation              AM-PAC PT "6 Clicks" Daily Activity  Outcome Measure  Difficulty turning over in bed (including adjusting bedclothes, sheets and blankets)?: A Little Difficulty moving from lying on back to sitting on the side of the bed? : Total Difficulty sitting down on and standing up from a chair with arms (e.g., wheelchair, bedside commode, etc,.)?: Total Help needed  moving to and from a bed to chair (including a wheelchair)?: A Little Help needed walking in hospital room?: A Little Help needed climbing 3-5 steps with a railing? : A Lot 6 Click Score: 13    End of Session Equipment Utilized During Treatment: Gait belt Activity Tolerance: Patient tolerated treatment well Patient left: in bed;with call bell/phone within reach;with bed alarm set   PT Visit Diagnosis: Unsteadiness on feet (R26.81);Difficulty in walking, not elsewhere classified (R26.2);History of falling (Z91.81)     Time: 8871-9597 PT Time Calculation (min) (ACUTE ONLY): 13 min  Charges:  $Therapeutic Exercise: 8-22 mins                    G Codes:       Quintin Hjort L. Tamala Julian, [place] Pager [phone] 04/25/2017    Galen Manila 04/25/2017, 3:01 PM

## 2017-04-25 NOTE — Clinical Social Work Note (Signed)
Clinical Social Work Assessment  Patient Details  Name: [name] MRN: [id number] Date of Birth: [birth year]  Date of referral:  04/25/17               Reason for consult:  Facility Placement                Permission sought to share information with:  Family Supports Permission granted to share information::  Yes, Verbal Permission Granted  Name::     [name]  Agency::     Relationship::  Sister  Contact Information:  [phone]  Housing/Transportation Living arrangements for the past 2 months:  Apartment Source of Information:  Patient Patient Interpreter Needed:  None Criminal Activity/Legal Involvement Pertinent to Current Situation/Hospitalization:  No - Comment as needed Significant Relationships:  Siblings Lives with:  Self Do you feel safe going back to the place where you live?  No (Patient agreeable to ST rehab to get stronger before going home) Need for family participation in patient care:  Yes (Comment)  Care giving concerns:  Patient expressed no concerns regarding his current living situation or the need for assistance.   Social Worker assessment / plan:  CSW talked with patient at the bedside regarding his discharge disposition and the recommendation of ST rehab. [name] was sitting up in a chair at bedside and was napping, but awakened easily and was willing to talk with CSW. [name] reported that he lives alone at [place] bring the residents lunch every day. Patient appears to enjoy his housing and reported that he does his own house cleaning.  CSW talked with patient regarding ST rehab and when asked, has never been to a facility for rehab, but has visited friends in facilities.  Patient was aware of the recommendation of ST rehab and mentioned [facility] as he has visited that facility before. [name] gave CSW permission to contact his sister [name] regarding his discharge. Call made to sister and she is in  agreement with rehab for her brother and also requested [facility].   Employment status:  Retired Forensic scientist:  Medicaid [place], Programmer, applications [facility]) PT Recommendations:  [place] / Referral to community resources:  [place] (Patient and sister provided CSW with facility preference)  Patient/Family's Response to care:  No concerns expressed by patient or sister regarding care during hospitalization.  Patient/Family's Understanding of and Emotional Response to Diagnosis, Current Treatment, and Prognosis:  Not discussed.  Emotional Assessment Appearance:  Appears stated age Attitude/Demeanor/Rapport:  Other (Appropriate) Affect (typically observed):  Appropriate, Pleasant Orientation:  Oriented to Self, Oriented to Place, Oriented to  Time, Fluctuating Orientation (Suspected and/or reported [place]) Alcohol / Substance use:  Tobacco Use, Alcohol Use, Illicit Drugs (Patient reported that he quit smoking and drinking many years ago and does not use illicit drugs) Psych involvement (Current and /or in the community):  No (Comment)  Discharge Needs  Concerns to be addressed:  Discharge Planning Concerns Readmission within the last 30 days:  No Current discharge risk:  None Barriers to Discharge:  No Barriers Identified   Sable Feil, [place] 04/25/2017, 2:05 PM

## 2017-04-25 NOTE — Progress Notes (Signed)
Report called to Nurse at [facility]. Pt will be transported via transport to facility.

## 2017-04-26 DIAGNOSIS — L89323 Pressure ulcer of left buttock, stage 3: Secondary | ICD-10-CM | POA: Diagnosis not present

## 2017-05-01 DIAGNOSIS — E119 Type 2 diabetes mellitus without complications: Secondary | ICD-10-CM | POA: Diagnosis not present

## 2017-05-01 DIAGNOSIS — I10 Essential (primary) hypertension: Secondary | ICD-10-CM | POA: Diagnosis not present

## 2017-05-01 DIAGNOSIS — E785 Hyperlipidemia, unspecified: Secondary | ICD-10-CM | POA: Diagnosis not present

## 2017-05-02 ENCOUNTER — Ambulatory Visit: Payer: Medicare HMO

## 2017-05-03 DIAGNOSIS — L89323 Pressure ulcer of left buttock, stage 3: Secondary | ICD-10-CM | POA: Diagnosis not present

## 2017-05-04 DIAGNOSIS — N390 Urinary tract infection, site not specified: Secondary | ICD-10-CM | POA: Diagnosis not present

## 2017-05-04 DIAGNOSIS — R7881 Bacteremia: Secondary | ICD-10-CM | POA: Diagnosis not present

## 2017-05-04 DIAGNOSIS — I10 Essential (primary) hypertension: Secondary | ICD-10-CM | POA: Diagnosis not present

## 2017-05-04 DIAGNOSIS — E1165 Type 2 diabetes mellitus with hyperglycemia: Secondary | ICD-10-CM | POA: Diagnosis not present

## 2017-05-04 NOTE — Progress Notes (Signed)
No ICM remote transmission received for 04/20/2017 and next ICM transmission scheduled for 05/15/2017.

## 2017-05-05 ENCOUNTER — Other Ambulatory Visit: Payer: Self-pay | Admitting: Physician Assistant

## 2017-05-08 ENCOUNTER — Other Ambulatory Visit: Payer: Self-pay | Admitting: *Deleted

## 2017-05-08 ENCOUNTER — Other Ambulatory Visit: Payer: Self-pay | Admitting: Internal Medicine

## 2017-05-08 DIAGNOSIS — N183 Chronic kidney disease, stage 3 (moderate): Secondary | ICD-10-CM | POA: Diagnosis not present

## 2017-05-08 DIAGNOSIS — I130 Hypertensive heart and chronic kidney disease with heart failure and stage 1 through stage 4 chronic kidney disease, or unspecified chronic kidney disease: Secondary | ICD-10-CM | POA: Diagnosis not present

## 2017-05-08 DIAGNOSIS — Z872 Personal history of diseases of the skin and subcutaneous tissue: Secondary | ICD-10-CM | POA: Diagnosis not present

## 2017-05-08 DIAGNOSIS — I5022 Chronic systolic (congestive) heart failure: Secondary | ICD-10-CM | POA: Diagnosis not present

## 2017-05-08 DIAGNOSIS — E1122 Type 2 diabetes mellitus with diabetic chronic kidney disease: Secondary | ICD-10-CM | POA: Diagnosis not present

## 2017-05-08 DIAGNOSIS — R339 Retention of urine, unspecified: Secondary | ICD-10-CM | POA: Diagnosis not present

## 2017-05-08 DIAGNOSIS — J439 Emphysema, unspecified: Secondary | ICD-10-CM | POA: Diagnosis not present

## 2017-05-08 DIAGNOSIS — E1151 Type 2 diabetes mellitus with diabetic peripheral angiopathy without gangrene: Secondary | ICD-10-CM

## 2017-05-08 DIAGNOSIS — E1152 Type 2 diabetes mellitus with diabetic peripheral angiopathy with gangrene: Secondary | ICD-10-CM | POA: Diagnosis not present

## 2017-05-08 DIAGNOSIS — I2510 Atherosclerotic heart disease of native coronary artery without angina pectoris: Secondary | ICD-10-CM | POA: Diagnosis not present

## 2017-05-08 NOTE — Patient Outreach (Signed)
[facility]) Care Management  05/08/2017  [name] 04-06-1931 [id number]  [employer] referral; patient discharged from [facility]  05/06/2017 to home.  Per medical record review: PCP-Dr. Lalla Brothers at [facility] stay at [facility]-7/28-04/25/2017 Dx: E coli bacteremia 2nd to UTI Other diagnoses:DM2-insulin dependent, CAD, ischemic cardiomyopathy, Chronic Heart Failure, ICD, [name] 3. Went to SNF for rehabilitation following hospital stay.  Last A1c level 12.1 on 04/22/2017.  Telephone call to patient who was advised of reason for call  & [facility] care management services, [place] verification received.  Patient voices that he lives alone in senior apartment at [facility]. States he was discharged from skilled facility to home August 12th.  States he takes his last antibiotic pill today. Voices he has control of passing his urine again & no blood is in urine. States he has " boil on buttock that burst' that required dressing change. States home health nurse came today and changed dressing & come back tomorrow. States he did not know name of agency.  Voice other medical conditions include diabetes, heart disease ( has defibrillator).   Patient states he had prescriptions filled today at [facility] . States he had new prescription for Novolog insulin & eyedrops. Patient states he was not sure of insulin dosage but would look on instruction sheet which is could not find currently. States he will call [facility] if he can't locate instruction sheet. Patient states he already takes Lantus in the PM. Voices that he is not checking blood sugar (states he does not have meter). States his diabetes is under control (last A!C was 12.1 on 04/22/2017).   Patient voices that he uses walker currently. States he uses bus to get to MD appointments. Has not made follow up appointment with primary care provider. Patient  advised of making appointment following inpatient admission. Phone number provided with PCP phone number & advised that he would call to make appointment. States already has appointment scheduled to se cardiologist 9/10 -Dr. Lovena Le.   Patient states he lives alone but has a friend that he can call on if needed.   Assessment: Patient has recent discharge to home from [facility] 8/12 Hospital diagnosis E Coli bacteremia 2nd UTI Uncontrolled diabetes-A1c 12.1 Patient is not checking blood sugar-does not have meter Taking Lantus; Novolog added to regimen Patient is falls risk -using walker Lives alone. Diabetes is not under control. Needs physical review of medications that patient is taking since SNF discharge Recommend Community care coordinator referral. Patient consents to [facility] care management referral. Has home health care for wound care.  Plan> Send to care management assistant to assign to [facility] care coordinator for complex case management.   Sherrin Daisy, RN BSN [facility] Management Coordinator [facility] Care Management  [phone]

## 2017-05-09 NOTE — Telephone Encounter (Signed)
No answer, recently disch from snf

## 2017-05-11 ENCOUNTER — Encounter: Payer: Self-pay | Admitting: *Deleted

## 2017-05-11 ENCOUNTER — Other Ambulatory Visit: Payer: Self-pay

## 2017-05-11 ENCOUNTER — Telehealth: Payer: Self-pay | Admitting: Student in an Organized Health Care Education/Training Program

## 2017-05-11 MED ORDER — ACCU-CHEK AVIVA PLUS W/DEVICE KIT
PACK | 0 refills | Status: DC
Start: 2017-05-11 — End: 2018-05-06

## 2017-05-11 NOTE — Telephone Encounter (Signed)
[place], REQUEST VERBAL ORDER

## 2017-05-11 NOTE — Telephone Encounter (Signed)
Return call to [facility]. 1) Request verbal order for "Nursing - twice a week x 2 weeks then once a week x 2 weeks" - vo given, let me know if not ok. 2) Pt needs Accu-chek meter rx sent to [facility]. Thanks

## 2017-05-11 NOTE — Telephone Encounter (Signed)
Great. Thank you.

## 2017-05-14 ENCOUNTER — Other Ambulatory Visit: Payer: Self-pay

## 2017-05-14 NOTE — Patient Outreach (Signed)
     Unsuccessful attempt made to contact patient via telephone for community care coordination.  Will make another attempt in the next 28 days.

## 2017-05-14 NOTE — Patient Outreach (Signed)
[facility]) Care Management  05/14/2017   [name] 10/05/1930 [id number]  Subjective:  I think I have everything straight now. I am getting around good  Objective:  Male 83 years old recently discharged from SNF after acute care discharge. Patient has multiple chronic illnesses  Current Medications:  Current Outpatient Prescriptions  Medication Sig Dispense Refill  . ACCU-CHEK AVIVA PLUS test strip USE TO CHECK BLOOD SUGARS THREE TIMES DAILY TO FOUR TIMES DAILY (Patient not taking: Reported on 04/21/2017) 300 each 6  . aspirin EC 81 MG tablet Take 81 mg by mouth daily.    Marland Kitchen atorvastatin (LIPITOR) 40 MG tablet TAKE 1 TABLET(40 MG) BY MOUTH DAILY AT 6 PM 90 tablet 2  . B-D ULTRAFINE III SHORT PEN 31G X 8 MM MISC USE AS DIRECTED ONCE DAILY FOR INSULIN INJECTION 100 each 0  . Blood Glucose Monitoring Suppl (ACCU-CHEK AVIVA PLUS) w/Device KIT Check finger stick glucose once daily 1 kit 0  . carvedilol (COREG) 12.5 MG tablet TAKE 1 TABLET BY MOUTH TWICE DAILY 180 tablet 0  . fluorometholone (FML) 0.1 % ophthalmic suspension Place 2 drops into both eyes daily.     . furosemide (LASIX) 40 MG tablet Take 1 tablet (40 mg total) by mouth 2 (two) times daily. 180 tablet 0  . Insulin Glargine (LANTUS SOLOSTAR) 100 UNIT/ML Solostar Pen Inject 15 Units into the skin at bedtime. (Patient taking differently: Inject 20 Units into the skin at bedtime. ) 15 mL 11  . lactose free nutrition (BOOST) LIQD Take 237 mLs by mouth daily.    . pantoprazole (PROTONIX) 40 MG tablet TAKE 1 TABLET(40 MG) BY MOUTH DAILY 90 tablet 0  . polyethylene glycol (MIRALAX / GLYCOLAX) packet Take 17 g by mouth daily. 14 each 0  . senna-docusate (SENOKOT-S) 8.6-50 MG tablet Take 2 tablets by mouth daily. 30 tablet 2  . tamsulosin (FLOMAX) 0.4 MG CAPS capsule Take 1 capsule (0.4 mg total) by mouth daily. (Patient taking differently: Take 0.4 mg by mouth daily after supper. ) 30 capsule 0  . VENTOLIN HFA  108 (90 Base) MCG/ACT inhaler INHALE 2 PUFFS INTO THE LUNGS EVERY 6 HOURS AS NEEDED FOR WHEEZING OR SHORTNESS OF BREATH 18 g 5   No current facility-administered medications for this visit.     Functional Status:  In your present state of health, do you have any difficulty performing the following activities: 04/23/2017 04/22/2017  Hearing? [name]  Vision? N N  Difficulty concentrating or making decisions? N N  Walking or climbing stairs? Y Y  Dressing or bathing? Y Y  Doing errands, shopping? Y -  Some recent data might be hidden    Fall/Depression Screening: Fall Risk  05/08/2017 12/11/2016 02/11/2016  Falls in the past year? Yes No No  Number falls in past yr: 1 - -  Injury with Fall? No - -  Risk for fall due to : - Impaired balance/gait;Medication side effect -  Follow up - - -   PHQ 2/9 Scores 05/08/2017 12/11/2016 02/11/2016 02/03/2016 11/22/2015 05/07/2015 04/14/2015  PHQ - 2 Score 0 0 0 0 0 0 0   Fall Risk  05/14/2017 05/08/2017 12/11/2016 02/11/2016 02/03/2016  Falls in the past year? Yes Yes No No No  Number falls in past yr: 1 1 - - -  Injury with Fall? No No - - -  Risk for fall due to : History of fall(s);Impaired balance/gait;Impaired mobility;Impaired vision;Medication side effect - Impaired balance/gait;Medication side effect - -  Follow up Follow up appointment - - - -   Depression screen [name] 2/9 05/14/2017 05/08/2017 12/11/2016 02/11/2016 02/03/2016  Decreased Interest 0 0 0 0 0  Down, Depressed, Hopeless 0 0 0 0 0  PHQ - 2 Score 0 0 0 0 0  Some recent data might be hidden   Assessment:  Patient referred to [name] for disease management, referral for community resources, assessment of need for medication educationn. Patient lives alone in senior independent apartments. Patient's sister, [name] is his primary support person. Patient reports living independently in his apartment.   Plan:  Home visit in the next 21 days for community care coordination  and assess need for community referrals

## 2017-05-15 ENCOUNTER — Ambulatory Visit (INDEPENDENT_AMBULATORY_CARE_PROVIDER_SITE_OTHER): Payer: Medicare HMO

## 2017-05-15 ENCOUNTER — Telehealth: Payer: Self-pay

## 2017-05-15 DIAGNOSIS — N183 Chronic kidney disease, stage 3 (moderate): Secondary | ICD-10-CM | POA: Diagnosis not present

## 2017-05-15 DIAGNOSIS — J439 Emphysema, unspecified: Secondary | ICD-10-CM | POA: Diagnosis not present

## 2017-05-15 DIAGNOSIS — I5022 Chronic systolic (congestive) heart failure: Secondary | ICD-10-CM

## 2017-05-15 DIAGNOSIS — E1122 Type 2 diabetes mellitus with diabetic chronic kidney disease: Secondary | ICD-10-CM | POA: Diagnosis not present

## 2017-05-15 DIAGNOSIS — I2510 Atherosclerotic heart disease of native coronary artery without angina pectoris: Secondary | ICD-10-CM | POA: Diagnosis not present

## 2017-05-15 DIAGNOSIS — I130 Hypertensive heart and chronic kidney disease with heart failure and stage 1 through stage 4 chronic kidney disease, or unspecified chronic kidney disease: Secondary | ICD-10-CM | POA: Diagnosis not present

## 2017-05-15 DIAGNOSIS — Z872 Personal history of diseases of the skin and subcutaneous tissue: Secondary | ICD-10-CM | POA: Diagnosis not present

## 2017-05-15 DIAGNOSIS — Z95810 Presence of automatic (implantable) cardiac defibrillator: Secondary | ICD-10-CM

## 2017-05-15 DIAGNOSIS — E1152 Type 2 diabetes mellitus with diabetic peripheral angiopathy with gangrene: Secondary | ICD-10-CM | POA: Diagnosis not present

## 2017-05-15 DIAGNOSIS — R339 Retention of urine, unspecified: Secondary | ICD-10-CM | POA: Diagnosis not present

## 2017-05-15 NOTE — Progress Notes (Signed)
EPIC Encounter for ICM Monitoring  Patient Name: [name] is a 83 y.o. male Date: 05/15/2017 Primary Care Physican: Axel Filler, MD Primary Cardiologist:Taylor Electrophysiologist: Druscilla Brownie Weight:unknown Bi-V Pacing: 97.8%            Attempted call to patient and unable to reach.  Left message to return call.  Transmission reviewed.    Thoracic impedance abnormal suggesting fluid accumulation since 04/21/2017 which correlates with hospital admission and starting to trend toward baseline.   Prescribed dosage: Furosemide 40 mg 1 tablet twice a day.   Labs: 04/25/2017 Creatinine 1.92, BUN 56, Potassium 4.4, Sodium 131, EGFR 30-35 04/24/2017 Creatinine 2.10, BUN 56, Potassium 4.3, Sodium 131, EGFR 27-32  04/23/2017 Creatinine 2.59, BUN 64, Potassium 4.4, Sodium 133, EGFR 21-25  04/22/2017 Creatinine 2.54, BUN 51, Potassium 3.8, Sodium 133, EGFR 22-25  04/21/2017 Creatinine 2.75, BUN 51, Potassium 4.0, Sodium 130, EGFR 20-23  12/11/2016 Creatinine 1.68, BUN 33, Potassium 4.3, Sodium 138, EGFR 37-43 04/17/2016 Creatinine 2.08, BUN 37, Potassium 4.1, Sodium 135, EGFR 28-32  Recommendations: NONE - Unable to reach patient   Follow-up plan: ICM clinic phone appointment on 05/18/2017 to recheck fluid levels (manual send).  Office appointment scheduled 06/04/2017 with Dr. Lovena Le.  Copy of ICM check sent to Dr Lovena Le device physician.   3 month ICM trend: 05/15/2017   1 Year ICM trend:      [name], RN 05/15/2017 11:42 AM

## 2017-05-15 NOTE — Telephone Encounter (Signed)
Remote ICM transmission received.  Attempted patient call and left message to return call.   

## 2017-05-17 DIAGNOSIS — Z872 Personal history of diseases of the skin and subcutaneous tissue: Secondary | ICD-10-CM | POA: Diagnosis not present

## 2017-05-17 DIAGNOSIS — I130 Hypertensive heart and chronic kidney disease with heart failure and stage 1 through stage 4 chronic kidney disease, or unspecified chronic kidney disease: Secondary | ICD-10-CM | POA: Diagnosis not present

## 2017-05-17 DIAGNOSIS — R339 Retention of urine, unspecified: Secondary | ICD-10-CM | POA: Diagnosis not present

## 2017-05-17 DIAGNOSIS — J439 Emphysema, unspecified: Secondary | ICD-10-CM | POA: Diagnosis not present

## 2017-05-17 DIAGNOSIS — E1152 Type 2 diabetes mellitus with diabetic peripheral angiopathy with gangrene: Secondary | ICD-10-CM | POA: Diagnosis not present

## 2017-05-17 DIAGNOSIS — N183 Chronic kidney disease, stage 3 (moderate): Secondary | ICD-10-CM | POA: Diagnosis not present

## 2017-05-17 DIAGNOSIS — I5022 Chronic systolic (congestive) heart failure: Secondary | ICD-10-CM | POA: Diagnosis not present

## 2017-05-17 DIAGNOSIS — I2510 Atherosclerotic heart disease of native coronary artery without angina pectoris: Secondary | ICD-10-CM | POA: Diagnosis not present

## 2017-05-17 DIAGNOSIS — E1122 Type 2 diabetes mellitus with diabetic chronic kidney disease: Secondary | ICD-10-CM | POA: Diagnosis not present

## 2017-05-18 ENCOUNTER — Telehealth: Payer: Self-pay

## 2017-05-18 NOTE — Telephone Encounter (Signed)
LMOVM requesting that pt send manual transmission 

## 2017-05-18 NOTE — Telephone Encounter (Signed)
Error

## 2017-05-22 ENCOUNTER — Other Ambulatory Visit: Payer: Self-pay

## 2017-05-22 DIAGNOSIS — Z872 Personal history of diseases of the skin and subcutaneous tissue: Secondary | ICD-10-CM | POA: Diagnosis not present

## 2017-05-22 DIAGNOSIS — R339 Retention of urine, unspecified: Secondary | ICD-10-CM | POA: Diagnosis not present

## 2017-05-22 DIAGNOSIS — E1152 Type 2 diabetes mellitus with diabetic peripheral angiopathy with gangrene: Secondary | ICD-10-CM | POA: Diagnosis not present

## 2017-05-22 DIAGNOSIS — I130 Hypertensive heart and chronic kidney disease with heart failure and stage 1 through stage 4 chronic kidney disease, or unspecified chronic kidney disease: Secondary | ICD-10-CM | POA: Diagnosis not present

## 2017-05-22 DIAGNOSIS — N183 Chronic kidney disease, stage 3 (moderate): Secondary | ICD-10-CM | POA: Diagnosis not present

## 2017-05-22 DIAGNOSIS — J439 Emphysema, unspecified: Secondary | ICD-10-CM | POA: Diagnosis not present

## 2017-05-22 DIAGNOSIS — I5022 Chronic systolic (congestive) heart failure: Secondary | ICD-10-CM | POA: Diagnosis not present

## 2017-05-22 DIAGNOSIS — I2510 Atherosclerotic heart disease of native coronary artery without angina pectoris: Secondary | ICD-10-CM | POA: Diagnosis not present

## 2017-05-22 DIAGNOSIS — E1122 Type 2 diabetes mellitus with diabetic chronic kidney disease: Secondary | ICD-10-CM | POA: Diagnosis not present

## 2017-05-22 NOTE — Patient Outreach (Signed)
[facility]) Care Management   05/22/2017  [name] August 24, 1931 [id number]  [name] is an 83 y.o. male  Subjective:  I need someone to help me with cleaning the house. I have friends to take me where I need to go sometimes ifthey are not busy. I have all my medicine, I take it like I should\  Objective:   ROS Thin elderly, well groomed gentleman, very pleasant.   Physical Exam ROS  Encounter Medications:   Outpatient Encounter Prescriptions as of 05/22/2017  Medication Sig  . ACCU-CHEK AVIVA PLUS test strip USE TO CHECK BLOOD SUGARS THREE TIMES DAILY TO FOUR TIMES DAILY (Patient not taking: Reported on 04/21/2017)  . aspirin EC 81 MG tablet Take 81 mg by mouth daily.  Marland Kitchen atorvastatin (LIPITOR) 40 MG tablet TAKE 1 TABLET(40 MG) BY MOUTH DAILY AT 6 PM  . B-D ULTRAFINE III SHORT PEN 31G X 8 MM MISC USE AS DIRECTED ONCE DAILY FOR INSULIN INJECTION  . Blood Glucose Monitoring Suppl (ACCU-CHEK AVIVA PLUS) w/Device KIT Check finger stick glucose once daily  . carvedilol (COREG) 12.5 MG tablet TAKE 1 TABLET BY MOUTH TWICE DAILY  . fluorometholone (FML) 0.1 % ophthalmic suspension Place 2 drops into both eyes daily.   . furosemide (LASIX) 40 MG tablet Take 1 tablet (40 mg total) by mouth 2 (two) times daily.  . Insulin Glargine (LANTUS SOLOSTAR) 100 UNIT/ML Solostar Pen Inject 15 Units into the skin at bedtime. (Patient taking differently: Inject 20 Units into the skin at bedtime. )  . lactose free nutrition (BOOST) LIQD Take 237 mLs by mouth daily.  . pantoprazole (PROTONIX) 40 MG tablet TAKE 1 TABLET(40 MG) BY MOUTH DAILY  . polyethylene glycol (MIRALAX / GLYCOLAX) packet Take 17 g by mouth daily.  Marland Kitchen senna-docusate (SENOKOT-S) 8.6-50 MG tablet Take 2 tablets by mouth daily.  . tamsulosin (FLOMAX) 0.4 MG CAPS capsule Take 1 capsule (0.4 mg total) by mouth daily. (Patient taking differently: Take 0.4 mg by mouth daily after supper. )  . VENTOLIN HFA 108 (90  Base) MCG/ACT inhaler INHALE 2 PUFFS INTO THE LUNGS EVERY 6 HOURS AS NEEDED FOR WHEEZING OR SHORTNESS OF BREATH   No facility-administered encounter medications on file as of 05/22/2017.     Functional Status:   In your present state of health, do you have any difficulty performing the following activities: 05/22/2017 04/23/2017  Hearing? [name]  Vision? Y N  Difficulty concentrating or making decisions? N N  Walking or climbing stairs? Y Y  Dressing or bathing? Y Y  Doing errands, shopping? [name]  Preparing Food and eating ? N -  Using the Toilet? N -  In the past six months, have you accidently leaked urine? Y -  Do you have problems with loss of bowel control? N -  Managing your Medications? N -  Managing your Finances? N -  Housekeeping or managing your Housekeeping? Y -  Some recent data might be hidden    Fall/Depression Screening:    Fall Risk  05/14/2017 05/08/2017 12/11/2016  Falls in the past year? Yes Yes No  Number falls in past yr: 1 1 -  Injury with Fall? No No -  Risk for fall due to : History of fall(s);Impaired balance/gait;Impaired mobility;Impaired vision;Medication side effect - Impaired balance/gait;Medication side effect  Follow up Follow up appointment - -   PHQ 2/9 Scores 05/14/2017 05/08/2017 12/11/2016 02/11/2016 02/03/2016 11/22/2015 05/07/2015  PHQ - 2 Score 0 0 0  0 0 0 0   Assessment:   Patient and this RNCM met to assess his needs for community care coordination. Patient expresses having an appointment with his primary care physician September 10. Patient reports having transportation, however, transportation is unreliable. Patient also needing assistance with light house cleaning, patient reports having full medicaid. Patient and this RNCM discussed the [place] Medicaid PCS Program and the process for applying for the services. Patient and this RNCM reviewed his case management care plan, updated.  Plan: Assist patient with application for the  Medicaid PCS   Program. Referral to [facility] LCSW for assistance with low income transportation application.

## 2017-05-24 DIAGNOSIS — I2510 Atherosclerotic heart disease of native coronary artery without angina pectoris: Secondary | ICD-10-CM | POA: Diagnosis not present

## 2017-05-24 DIAGNOSIS — E1122 Type 2 diabetes mellitus with diabetic chronic kidney disease: Secondary | ICD-10-CM | POA: Diagnosis not present

## 2017-05-24 DIAGNOSIS — I5022 Chronic systolic (congestive) heart failure: Secondary | ICD-10-CM | POA: Diagnosis not present

## 2017-05-24 DIAGNOSIS — N183 Chronic kidney disease, stage 3 (moderate): Secondary | ICD-10-CM | POA: Diagnosis not present

## 2017-05-24 DIAGNOSIS — E1152 Type 2 diabetes mellitus with diabetic peripheral angiopathy with gangrene: Secondary | ICD-10-CM | POA: Diagnosis not present

## 2017-05-24 DIAGNOSIS — J439 Emphysema, unspecified: Secondary | ICD-10-CM | POA: Diagnosis not present

## 2017-05-24 DIAGNOSIS — Z872 Personal history of diseases of the skin and subcutaneous tissue: Secondary | ICD-10-CM | POA: Diagnosis not present

## 2017-05-24 DIAGNOSIS — R339 Retention of urine, unspecified: Secondary | ICD-10-CM | POA: Diagnosis not present

## 2017-05-24 DIAGNOSIS — I130 Hypertensive heart and chronic kidney disease with heart failure and stage 1 through stage 4 chronic kidney disease, or unspecified chronic kidney disease: Secondary | ICD-10-CM | POA: Diagnosis not present

## 2017-05-25 NOTE — Progress Notes (Signed)
No ICM remote transmission received for 05/18/2017 and next ICM transmission scheduled for 07/05/2017 since patient has in office defib check with Dr Lovena Le on 06/04/17.[name]

## 2017-05-31 ENCOUNTER — Other Ambulatory Visit: Payer: Self-pay | Admitting: Student in an Organized Health Care Education/Training Program

## 2017-05-31 DIAGNOSIS — I5022 Chronic systolic (congestive) heart failure: Secondary | ICD-10-CM | POA: Diagnosis not present

## 2017-05-31 DIAGNOSIS — R339 Retention of urine, unspecified: Secondary | ICD-10-CM | POA: Diagnosis not present

## 2017-05-31 DIAGNOSIS — E1122 Type 2 diabetes mellitus with diabetic chronic kidney disease: Secondary | ICD-10-CM | POA: Diagnosis not present

## 2017-05-31 DIAGNOSIS — I130 Hypertensive heart and chronic kidney disease with heart failure and stage 1 through stage 4 chronic kidney disease, or unspecified chronic kidney disease: Secondary | ICD-10-CM | POA: Diagnosis not present

## 2017-05-31 DIAGNOSIS — J439 Emphysema, unspecified: Secondary | ICD-10-CM | POA: Diagnosis not present

## 2017-05-31 DIAGNOSIS — N183 Chronic kidney disease, stage 3 (moderate): Secondary | ICD-10-CM | POA: Diagnosis not present

## 2017-05-31 DIAGNOSIS — E1152 Type 2 diabetes mellitus with diabetic peripheral angiopathy with gangrene: Secondary | ICD-10-CM | POA: Diagnosis not present

## 2017-05-31 DIAGNOSIS — Z872 Personal history of diseases of the skin and subcutaneous tissue: Secondary | ICD-10-CM | POA: Diagnosis not present

## 2017-05-31 DIAGNOSIS — I2510 Atherosclerotic heart disease of native coronary artery without angina pectoris: Secondary | ICD-10-CM | POA: Diagnosis not present

## 2017-06-01 ENCOUNTER — Emergency Department (HOSPITAL_COMMUNITY)
Admission: EM | Admit: 2017-06-01 | Discharge: 2017-06-01 | Disposition: A | Discharge status: Home / Self Care | Payer: Medicare HMO | Attending: Emergency Medicine | Admitting: Emergency Medicine

## 2017-06-01 ENCOUNTER — Encounter (HOSPITAL_COMMUNITY): Payer: Self-pay | Admitting: *Deleted

## 2017-06-01 DIAGNOSIS — K625 Hemorrhage of anus and rectum: Secondary | ICD-10-CM

## 2017-06-01 DIAGNOSIS — Z79899 Other long term (current) drug therapy: Secondary | ICD-10-CM | POA: Diagnosis not present

## 2017-06-01 DIAGNOSIS — I130 Hypertensive heart and chronic kidney disease with heart failure and stage 1 through stage 4 chronic kidney disease, or unspecified chronic kidney disease: Secondary | ICD-10-CM | POA: Diagnosis not present

## 2017-06-01 DIAGNOSIS — Z87891 Personal history of nicotine dependence: Secondary | ICD-10-CM | POA: Insufficient documentation

## 2017-06-01 DIAGNOSIS — N183 Chronic kidney disease, stage 3 (moderate): Secondary | ICD-10-CM | POA: Insufficient documentation

## 2017-06-01 DIAGNOSIS — Z794 Long term (current) use of insulin: Secondary | ICD-10-CM | POA: Diagnosis not present

## 2017-06-01 DIAGNOSIS — Z7982 Long term (current) use of aspirin: Secondary | ICD-10-CM | POA: Insufficient documentation

## 2017-06-01 DIAGNOSIS — K5641 Fecal impaction: Secondary | ICD-10-CM | POA: Insufficient documentation

## 2017-06-01 DIAGNOSIS — E1159 Type 2 diabetes mellitus with other circulatory complications: Secondary | ICD-10-CM | POA: Diagnosis not present

## 2017-06-01 DIAGNOSIS — I5022 Chronic systolic (congestive) heart failure: Secondary | ICD-10-CM | POA: Diagnosis not present

## 2017-06-01 DIAGNOSIS — Z95810 Presence of automatic (implantable) cardiac defibrillator: Secondary | ICD-10-CM | POA: Insufficient documentation

## 2017-06-01 LAB — COMPREHENSIVE METABOLIC PANEL
ALT: 18 U/L (ref 17–63)
AST: 29 U/L (ref 15–41)
Albumin: 3.4 g/dL — ABNORMAL LOW (ref 3.5–5.0)
Alkaline Phosphatase: 90 U/L (ref 38–126)
Anion gap: 9 (ref 5–15)
BUN: 34 mg/dL — ABNORMAL HIGH (ref 6–20)
CO2: 29 mmol/L (ref 22–32)
Calcium: 9.9 mg/dL (ref 8.9–10.3)
Chloride: 101 mmol/L (ref 101–111)
Creatinine, Ser: 2.1 mg/dL — ABNORMAL HIGH (ref 0.61–1.24)
GFR calc Af Amer: 32 mL/min — ABNORMAL LOW (ref >60)
GFR calc non Af Amer: 27 mL/min — ABNORMAL LOW (ref >60)
Glucose, Bld: 252 mg/dL — ABNORMAL HIGH (ref 65–99)
Potassium: 4.7 mmol/L (ref 3.5–5.1)
Sodium: 139 mmol/L (ref 135–145)
Total Bilirubin: 0.7 mg/dL (ref 0.3–1.2)
Total Protein: 7.4 g/dL (ref 6.5–8.1)

## 2017-06-01 LAB — CBC
HCT: 36.7 % — ABNORMAL LOW (ref 39.0–52.0)
Hemoglobin: 11.9 g/dL — ABNORMAL LOW (ref 13.0–17.0)
MCH: 29.8 pg (ref 26.0–34.0)
MCHC: 32.4 g/dL (ref 30.0–36.0)
MCV: 92 fL (ref 78.0–100.0)
Platelets: 189 10*3/uL (ref 150–400)
RBC: 3.99 MIL/uL — ABNORMAL LOW (ref 4.22–5.81)
RDW: 14.6 % (ref 11.5–15.5)
WBC: 8.1 10*3/uL (ref 4.0–10.5)

## 2017-06-01 LAB — ABO/RH: ABO/RH(D): O POS

## 2017-06-01 LAB — TYPE AND SCREEN
ABO/RH(D): O POS
Antibody Screen: NEGATIVE

## 2017-06-01 MED ORDER — POLYETHYLENE GLYCOL 3350 17 G PO PACK: 34.0000 g | PACK | Freq: Once | ORAL | Status: DC

## 2017-06-01 MED ORDER — SODIUM CHLORIDE 0.9 % IV BOLUS (SEPSIS)
500.0000 mL | Freq: Once | INTRAVENOUS | Status: AC
  Administered 2017-06-01: 500 mL via INTRAVENOUS

## 2017-06-01 NOTE — ED Notes (Signed)
Patient had large BM, cleaned up, pt states feels much better and has no pain.

## 2017-06-01 NOTE — ED Provider Notes (Signed)
[facility]EM[facility]Y DEPT Provider Note   CSN: [id number] Arrival date & time: 06/01/17  0928     History   Chief Complaint Chief Complaint  Patient presents with  . Rectal Bleeding    HPI [name] is a 83 y.o. male.  HPI   83-year-old male with rectal bleeding and rectal pain with bowel movements. Onset yesterday. Denies any abdominal pain. He noticed a small amount of bright red blood mixed in with his stool. No melena. No dizziness, lightheadedness or shortness of breath. Nares blood thinning medication.  Past Medical History:  Diagnosis Date  . Adenomatous colon polyp 02/14/2012  . BBB (bundle branch block)    right  . Carotid stenosis    a. s/p L carotid stent 2004;  b. Carotid US (09/2014): Bilateral ICA 1-39%, left ECA >59%, normal subclavian bilaterally, occluded left vertebral >> FU 2 years  . Chronic kidney disease   . Chronic systolic CHF (congestive heart failure) (HCC)    a. ischemic CM EF 15-20%;  b. s/p AICD 05/24/04;  c. Echo 7/06: EF 30-40%, mild reduced RVSF, d. Echo 12/2015 EF 35-40%  . Diverticulosis of colon   . Dysrhythmia   . Elevated PSA   . HTN (hypertension)   . Hyperlipidemia   . ICD (implantable cardiac defibrillator) in place 12-25-2012   MDT CRTD upgrade by Dr Taylor  . Myocardial infarction (HCC) 1990  . Pericardial effusion    Echocardiogram (09/2014): EF 25% with distal anterior, distal inferior, distal lateral and apical akinesis, grade 1 diastolic dysfunction, very mild aortic stenosis (mean 7 mmHg) - this may be depressed due to low EF (2-D images suggest mild to moderate aortic stenosis), large pericardial effusion, no RA collapse  . Peripheral vascular disease (HCC)   . PVD (peripheral vascular disease) (HCC)    s/p L carotid PTCA/stent 2004  . Transient ischemic attack   . Type II diabetes mellitus (HCC)     Patient Active Problem List   Diagnosis Date Noted  . Bacteremia due to Escherichia coli 04/23/2017  . Elevated troponin  04/22/2017  . Pressure injury of skin 04/22/2017  . Sacral pressure ulcer 04/22/2017  . Weakness   . Complicated UTI (urinary tract infection) 04/21/2017  . Weight loss 12/11/2016  . Advanced care planning/counseling discussion 12/11/2016  . Urinary retention 01/13/2016  . Acute kidney injury (HCC)   . Healthcare maintenance 11/22/2015  . [name] (chronic kidney disease) stage 3, GFR 30-59 ml/min 12/04/2014  . Automatic implantable cardioverter-defibrillator in situ 01/16/2012  . Coronary atherosclerosis 12/25/2007  . Chronic systolic congestive heart failure (HCC) 12/25/2007  . Hyperlipidemia 01/02/2007  . Type 2 diabetes mellitus with peripheral vascular disease (HCC) 01/01/1989    Past Surgical History:  Procedure Laterality Date  . BI-VENTRICULAR IMPLANTABLE CARDIOVERTER DEFIBRILLATOR UPGRADE N/A 12/25/2012   Procedure: BI-VENTRICULAR IMPLANTABLE CARDIOVERTER DEFIBRILLATOR UPGRADE;  Surgeon: Gregg W Taylor, MD;  Location: [facility] CATH [facility];  Service: Cardiovascular;  Laterality: N/A;  . BIV ICD UPGRADE  12/25/2012   MDT CRTD upgrade by Dr Taylor for ischemic cardiomyopathy and worsening conduction system disease  . CARDIAC CATHETERIZATION  06/2003,  01/2004  . CARDIAC CATHETERIZATION N/A 01/13/2016   Procedure: Left Heart Cath and Coronary Angiography;  Surgeon: Jayadeep S Varanasi, MD;  Location: [facility] INVASIVE CV LAB;  Service: Cardiovascular;  Laterality: N/A;  . CARDIAC DEFIBRILLATOR PLACEMENT  05/24/2004   Implantation of a MDT single-chamber defibrillator  . CAROTID STENT  09/11/2003   Percutaneous transluminal angioplasty and stent placement of the   left internal carotid artery.  Marland Kitchen CATARACT EXTRACTION W/ INTRAOCULAR LENS  IMPLANT, BILATERAL Bilateral ~ 2010  . CORONARY ANGIOPLASTY WITH STENT PLACEMENT  1990   "2" (12/25/2012)  . INSERT / REPLACE / REMOVE PACEMAKER    . LEAD REVISION N/A 12/25/2012   Procedure: LEAD REVISION;  Surgeon: Evans Lance, MD;  Location: [facility] CATH LAB;  Service:  Cardiovascular;  Laterality: N/A;       Home Medications    Prior to Admission medications   Medication Sig Start Date End Date Taking? Authorizing Provider  ACCU-CHEK AVIVA PLUS test strip USE TO CHECK BLOOD SUGARS THREE TIMES DAILY TO FOUR TIMES DAILY Patient not taking: Reported on 04/21/2017 11/24/15   Axel Filler, MD  aspirin EC 81 MG tablet Take 81 mg by mouth daily.    [provider]  atorvastatin (LIPITOR) 40 MG tablet TAKE 1 TABLET(40 MG) BY MOUTH DAILY AT 6 PM 04/14/16   Axel Filler, MD  B-D ULTRAFINE III SHORT PEN 31G X 8 MM MISC USE AS DIRECTED ONCE DAILY FOR INSULIN INJECTION 05/08/17   Axel Filler, MD  Blood Glucose Monitoring Suppl (ACCU-CHEK AVIVA PLUS) w/Device KIT Check finger stick glucose once daily 05/11/17   Axel Filler, MD  carvedilol (COREG) 12.5 MG tablet TAKE 1 TABLET BY MOUTH TWICE DAILY 05/07/17   Evans Lance, MD  fluorometholone (FML) 0.1 % ophthalmic suspension Place 2 drops into both eyes daily.  06/23/16   [provider]  furosemide (LASIX) 40 MG tablet Take 1 tablet (40 mg total) by mouth 2 (two) times daily. 04/10/17   Evans Lance, MD  Insulin Glargine (LANTUS SOLOSTAR) 100 UNIT/ML Solostar Pen Inject 15 Units into the skin at bedtime. Patient taking differently: Inject 20 Units into the skin at bedtime.  01/28/16   Norval Gable, MD  lactose free nutrition (BOOST) LIQD Take 237 mLs by mouth daily.    [provider]  pantoprazole (PROTONIX) 40 MG tablet TAKE 1 TABLET(40 MG) BY MOUTH DAILY 05/31/17   Axel Filler, MD  polyethylene glycol [facility] / Floria Raveling) packet Take 17 g by mouth daily. 02/06/17   Charlesetta Shanks, MD  senna-docusate (SENOKOT-S) 8.6-50 MG tablet Take 2 tablets by mouth daily. 05/04/16   Konrad Felix, PA  tamsulosin (FLOMAX) 0.4 MG CAPS capsule Take 1 capsule (0.4 mg total) by mouth daily. Patient taking differently: Take 0.4 mg by mouth daily after  supper.  02/06/17   Charlesetta Shanks, MD  VENTOLIN HFA 108 (90 Base) MCG/ACT inhaler INHALE 2 PUFFS INTO THE LUNGS EVERY 6 HOURS AS NEEDED FOR WHEEZING OR SHORTNESS OF BREATH 03/15/16   Axel Filler, MD    Family History Family History  Problem Relation Age of Onset  . Diabetes Mother   . Diabetes Brother   . Heart attack Neg Hx   . Stroke Neg Hx     Social History Social History  Substance Use Topics  . Smoking status: Former Smoker    Types: Cigarettes    Quit date: 12/27/1967  . Smokeless tobacco: Never Used  . Alcohol use No     Comment: 12/25/2012 "quit all alcohol 60 yr ago"     Allergies   Patient has no known allergies.   Review of Systems Review of Systems  All systems reviewed and negative, other than as noted in HPI.  Physical Exam Updated Vital Signs BP (!) 85/50 (BP Location: Left Arm)   Pulse 77   Temp  98.1 F (36.7 C) (Oral)   Resp 16   SpO2 98%   Physical Exam  Constitutional: He appears well-developed and well-nourished. No distress.  HENT:  Head: Normocephalic and atraumatic.  Eyes: Conjunctivae are normal. Right eye exhibits no discharge. Left eye exhibits no discharge.  Neck: Neck supple.  Cardiovascular: Normal rate, regular rhythm and normal heart sounds.  Exam reveals no gallop and no friction rub.   No murmur heard. Pulmonary/Chest: Effort normal and breath sounds normal. No respiratory distress.  Abdominal: Soft. He exhibits no distension. There is no tenderness.  Genitourinary:  Genitourinary Comments: No lesions noted. Large amount of stool impacted in rectum. Light brown in color. Digitally disimpacted. No gross blood.  Musculoskeletal: He exhibits no edema or tenderness.  Neurological: He is alert.  Skin: Skin is warm and dry.  Psychiatric: He has a normal mood and affect. His behavior is normal. Thought content normal.  Nursing note and vitals reviewed.    ED Treatments / Results  Labs (all labs ordered are listed, but  only abnormal results are displayed) Labs Reviewed  COMPREHENSIVE METABOLIC PANEL - Abnormal; Notable for the following:       Result Value   Glucose, Bld 252 (*)    BUN 34 (*)    Creatinine, Ser 2.10 (*)    Albumin 3.4 (*)    GFR calc non Af Amer 27 (*)    GFR calc Af Amer 32 (*)    All other components within normal limits  CBC - Abnormal; Notable for the following:    RBC 3.99 (*)    Hemoglobin 11.9 (*)    HCT 36.7 (*)    All other components within normal limits  POC OCCULT BLOOD, ED  TYPE AND SCREEN  ABO/RH    EKG  EKG Interpretation None       Radiology No results found.  Procedures Fecal disimpaction Date/Time: 06/01/2017 11:30 AM Performed by: Virgel Manifold Authorized by: Virgel Manifold  Consent: Verbal consent obtained. Consent given by: patient Site marked: the operative site was marked Local anesthesia used: no  Anesthesia: Local anesthesia used: no  Sedation: Patient sedated: no Patient tolerance: Patient tolerated the procedure well with no immediate complications Comments: Digital disimpaction of large amount of inspissated brown stool.    (including critical care time)  Medications Ordered in ED Medications  polyethylene glycol (MIRALAX / GLYCOLAX) packet 34 g (not administered)     Initial Impression / Assessment and Plan / ED Course  I have reviewed the triage vital signs and the nursing notes.  Pertinent labs & imaging results that were available during my care of the patient were reviewed by me and considered in my medical decision making (see chart for details).     83 year old male with rectal pain and rectal bleeding. He describes a small amount of bright red blood. On my exam I cannot appreciate any gross blood. There are no hemorrhoids or other concerning lesions. He did have a feculent packs and though. He was digitally disimpacted and given an enema. He reports much improvement of his symptoms after he had a large bowel  movement. No bleeding was noted with this. I suspect blood noticed earlier may have been from straining. I doubts significant GI bleed. He is hemodynamically stable. His H&H is okay. Diet & bowel regimen was discussed as well as return precautions.  Final Clinical Impressions(s) / ED Diagnoses   Final diagnoses:  Fecal impaction in rectum [facility])  Rectal bleeding  New Prescriptions New Prescriptions   No medications on file     , , MD 06/01/17 1313  

## 2017-06-01 NOTE — ED Notes (Signed)
MD at bedside. 

## 2017-06-01 NOTE — ED Notes (Signed)
Pt left before discharge instructions given.

## 2017-06-01 NOTE — ED Triage Notes (Signed)
Pt reports noticing blood in stools. Since last night passing blood and having pain with bowel movements. Denies passing clots or being on blood thinners. bp is 85/50 at triage.

## 2017-06-04 ENCOUNTER — Encounter: Payer: Self-pay | Admitting: Student in an Organized Health Care Education/Training Program

## 2017-06-04 ENCOUNTER — Other Ambulatory Visit: Payer: Self-pay | Admitting: Licensed Clinical Social Worker

## 2017-06-04 ENCOUNTER — Ambulatory Visit (INDEPENDENT_AMBULATORY_CARE_PROVIDER_SITE_OTHER): Payer: Medicare HMO | Admitting: Student in an Organized Health Care Education/Training Program

## 2017-06-04 ENCOUNTER — Ambulatory Visit (INDEPENDENT_AMBULATORY_CARE_PROVIDER_SITE_OTHER): Payer: Medicare HMO | Admitting: Internal Medicine

## 2017-06-04 ENCOUNTER — Encounter: Payer: Self-pay | Admitting: Internal Medicine

## 2017-06-04 VITALS — BP 123/75 | HR 75 | Temp 97.6°F | Ht 68.0 in | Wt 127.9 lb

## 2017-06-04 VITALS — BP 130/59 | HR 77 | Ht 68.0 in | Wt 130.2 lb

## 2017-06-04 DIAGNOSIS — E1151 Type 2 diabetes mellitus with diabetic peripheral angiopathy without gangrene: Secondary | ICD-10-CM

## 2017-06-04 DIAGNOSIS — R634 Abnormal weight loss: Secondary | ICD-10-CM

## 2017-06-04 DIAGNOSIS — Z95810 Presence of automatic (implantable) cardiac defibrillator: Secondary | ICD-10-CM | POA: Diagnosis not present

## 2017-06-04 DIAGNOSIS — K5900 Constipation, unspecified: Secondary | ICD-10-CM

## 2017-06-04 DIAGNOSIS — I5022 Chronic systolic (congestive) heart failure: Secondary | ICD-10-CM

## 2017-06-04 LAB — CUP PACEART INCLINIC DEVICE CHECK
Brady Statistic AP VP Percent: 20.86 %
Brady Statistic AP VS Percent: 0.24 %
Brady Statistic AS VP Percent: 76.66 %
Brady Statistic AS VS Percent: 2.23 %
Brady Statistic RV Percent Paced: 95.92 %
Date Time Interrogation Session: 20180910164709
HighPow Impedance: 51 Ohm
Implantable Lead Implant Date: 20140402
Implantable Lead Implant Date: 20140402
Implantable Lead Location: 753858
Implantable Lead Location: 753860
Implantable Lead Model: 5076
Lead Channel Impedance Value: 228 Ohm
Lead Channel Impedance Value: 399 Ohm
Lead Channel Impedance Value: 399 Ohm
Lead Channel Impedance Value: 475 Ohm
Lead Channel Impedance Value: 532 Ohm
Lead Channel Pacing Threshold Amplitude: 0.75 V
Lead Channel Pacing Threshold Amplitude: 0.75 V
Lead Channel Pacing Threshold Pulse Width: 0.4 ms
Lead Channel Pacing Threshold Pulse Width: 0.6 ms
Lead Channel Sensing Intrinsic Amplitude: 4.375 mV
Lead Channel Setting Pacing Amplitude: 2 V
Lead Channel Setting Sensing Sensitivity: 0.3 mV
MDC IDC LEAD IMPLANT DT: 20140402
MDC IDC LEAD LOCATION: 753859
MDC IDC MSMT BATTERY REMAINING LONGEVITY: 32 mo
MDC IDC MSMT BATTERY VOLTAGE: 2.95 V
MDC IDC MSMT LEADCHNL RV IMPEDANCE VALUE: 361 Ohm
MDC IDC MSMT LEADCHNL RV PACING THRESHOLD AMPLITUDE: 0.75 V
MDC IDC MSMT LEADCHNL RV PACING THRESHOLD PULSEWIDTH: 0.4 ms
MDC IDC MSMT LEADCHNL RV SENSING INTR AMPL: 13.75 mV
MDC IDC PG IMPLANT DT: 20140402
MDC IDC SET LEADCHNL LV PACING AMPLITUDE: 1.5 V
MDC IDC SET LEADCHNL LV PACING PULSEWIDTH: 0.6 ms
MDC IDC SET LEADCHNL RV PACING AMPLITUDE: 2.5 V
MDC IDC SET LEADCHNL RV PACING PULSEWIDTH: 0.4 ms
MDC IDC STAT BRADY RA PERCENT PACED: 20.67 %

## 2017-06-04 MED ORDER — INSULIN ASPART 100 UNIT/ML ~~LOC~~ SOLN: SUBCUTANEOUS | 3 refills | Status: DC

## 2017-06-04 NOTE — Patient Outreach (Signed)
[facility]) Care Management  06/04/2017  DAM ASHRAF 07/01/1931 [id number]  Assessment-CSW completed outreach attempt today. CSW unable to reach patient successfully. CSW left a HIPPA compliant voice message encouraging patient to return call once available.  Plan-CSW will await return call or complete an additional outreach if needed.  Eula Fried, BSW, MSW, [email or web address] Phone: [phone] Fax: [phone]

## 2017-06-04 NOTE — Assessment & Plan Note (Signed)
Over the last 2 years he's had a pretty dramatic unintentional weight loss, current weight is 127 pounds with a BMI of 19. A CT of his chest abdomen and pelvis did not reveal a malignancy as the source. He has chronically elevated PSA and follows with urology. This could be multifactorial from his advanced heart failure, poorly controlled diabetes, and [name]. 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 For now his functional status at home is independent he's doing fairly well. We are going to continue with nutritional supplements and close follow up.

## 2017-06-04 NOTE — Progress Notes (Signed)
HPI [name] returns today for followup. He is a pleasant 83 yo man with a h/o an ischemic cardiomyopathy, chronic systolic heart failure, ejection fraction 20%, complete heart block, status post biventricular ICD. In the interim, the patient has had multiple problems including UTI's, fecal impaction and CHF. No ICD shocks. He was very sick over a year ago and had a VF arrest but is improved now. He still quite sedentary. He has not gained some his weight back. No Known Allergies   Current Outpatient Prescriptions  Medication Sig Dispense Refill  . ACCU-CHEK AVIVA PLUS test strip USE TO CHECK BLOOD SUGARS THREE TIMES DAILY TO FOUR TIMES DAILY 300 each 6  . aspirin EC 81 MG tablet Take 81 mg by mouth daily.    Marland Kitchen atorvastatin (LIPITOR) 40 MG tablet TAKE 1 TABLET(40 MG) BY MOUTH DAILY AT 6 PM 90 tablet 2  . B-D ULTRAFINE III SHORT PEN 31G X 8 MM MISC USE AS DIRECTED ONCE DAILY FOR INSULIN INJECTION 100 each 0  . Blood Glucose Monitoring Suppl (ACCU-CHEK AVIVA PLUS) w/Device KIT Check finger stick glucose once daily 1 kit 0  . carvedilol (COREG) 12.5 MG tablet TAKE 1 TABLET BY MOUTH TWICE DAILY 180 tablet 0  . fluorometholone (FML) 0.1 % ophthalmic suspension Place 2 drops into both eyes daily.     . furosemide (LASIX) 40 MG tablet Take 1 tablet (40 mg total) by mouth 2 (two) times daily. 180 tablet 0  . insulin aspart (NOVOLOG) 100 UNIT/ML injection Per sliding scale instructions 10 mL 3  . insulin glargine (LANTUS) 100 UNIT/ML injection Inject 20 Units into the skin at bedtime.    . lactose free nutrition (BOOST) LIQD Take 237 mLs by mouth daily.    . pantoprazole (PROTONIX) 40 MG tablet TAKE 1 TABLET(40 MG) BY MOUTH DAILY 90 tablet 0  . polyethylene glycol (MIRALAX / GLYCOLAX) packet Take 17 g by mouth daily. 14 each 0  . senna-docusate (SENOKOT-S) 8.6-50 MG tablet Take 2 tablets by mouth daily. 30 tablet 2  . tamsulosin (FLOMAX) 0.4 MG CAPS capsule Take 0.4 mg by mouth daily after  supper.    . VENTOLIN HFA 108 (90 Base) MCG/ACT inhaler INHALE 2 PUFFS INTO THE LUNGS EVERY 6 HOURS AS NEEDED FOR WHEEZING OR SHORTNESS OF BREATH 18 g 5   No current facility-administered medications for this visit.      Past Medical History:  Diagnosis Date  . Adenomatous colon polyp 02/14/2012  . BBB (bundle branch block)    right  . Carotid stenosis    a. s/p L carotid stent 2004;  b. Carotid US (09/2014): Bilateral ICA 1-39%, left ECA >59%, normal subclavian bilaterally, occluded left vertebral >> FU 2 years  . Chronic kidney disease   . Chronic systolic CHF (congestive heart failure) (HCC)    a. ischemic CM EF 15-20%;  b. s/p AICD 05/24/04;  c. Echo 7/06: EF 30-40%, mild reduced RVSF, d. Echo 12/2015 EF 35-40%  . Elevated PSA   . HTN (hypertension)   . Hyperlipidemia   . ICD (implantable cardiac defibrillator) in place 12-25-2012   MDT CRTD upgrade by Dr Lovena Le  . Myocardial infarction ([place]) 1990  . Pericardial effusion    Echocardiogram (09/2014): EF 25% with distal anterior, distal inferior, distal lateral and apical akinesis, grade 1 diastolic dysfunction, very mild aortic stenosis (mean 7 mmHg) - this may be depressed due to low EF (2-D images suggest mild to moderate aortic stenosis), large  pericardial effusion, no RA collapse  . PVD (peripheral vascular disease) ([place])    s/p L carotid PTCA/stent 2004  . Transient ischemic attack   . Type II diabetes mellitus (HCC)     ROS:   All systems reviewed and negative except as noted in the HPI.   Past Surgical History:  Procedure Laterality Date  . BI-VENTRICULAR IMPLANTABLE CARDIOVERTER DEFIBRILLATOR UPGRADE N/A 12/25/2012   Procedure: BI-VENTRICULAR IMPLANTABLE CARDIOVERTER DEFIBRILLATOR UPGRADE;  Surgeon: Evans Lance, MD;  Location: [facility] CATH LAB;  Service: Cardiovascular;  Laterality: N/A;  . BIV ICD UPGRADE  12/25/2012   MDT CRTD upgrade by Dr Lovena Le for ischemic cardiomyopathy and worsening conduction system disease  .  CARDIAC CATHETERIZATION  06/2003,  01/2004  . CARDIAC CATHETERIZATION N/A 01/13/2016   Procedure: Left Heart Cath and Coronary Angiography;  Surgeon: Jettie Booze, MD;  Location: [facility] CV LAB;  Service: Cardiovascular;  Laterality: N/A;  . CARDIAC DEFIBRILLATOR PLACEMENT  05/24/2004   Implantation of a MDT single-chamber defibrillator  . CAROTID STENT  09/11/2003   Percutaneous transluminal angioplasty and stent placement of the left internal carotid artery.  Marland Kitchen CATARACT EXTRACTION W/ INTRAOCULAR LENS  IMPLANT, BILATERAL Bilateral ~ 2010  . CORONARY ANGIOPLASTY WITH STENT PLACEMENT  1990   "2" (12/25/2012)  . INSERT / REPLACE / REMOVE PACEMAKER    . LEAD REVISION N/A 12/25/2012   Procedure: LEAD REVISION;  Surgeon: Evans Lance, MD;  Location: [facility] CATH LAB;  Service: Cardiovascular;  Laterality: N/A;     Family History  Problem Relation Age of Onset  . Diabetes Mother   . Diabetes Brother   . Heart attack Neg Hx   . Stroke Neg Hx      Social History   Social History  . Marital status: Widowed    Spouse name: N/A  . Number of children: N/A  . Years of education: 71   Occupational History  . retired    Social History Main Topics  . Smoking status: Former Smoker    Types: Cigarettes    Quit date: 12/27/1967  . Smokeless tobacco: Never Used  . Alcohol use No     Comment: 12/25/2012 "quit all alcohol 60 yr ago"  . Drug use: No  . Sexual activity: Not on file   Other Topics Concern  . Not on file   Social History Narrative   ** Merged History Encounter **       Single, 2 adult children, daughter in [place], son in [place]     BP (!) 130/59   Pulse 77   Ht '5\' 8"'$  (1.727 m)   Wt 130 lb 3.2 oz (59.1 kg)   SpO2 96%   BMI 19.80 kg/m   Physical Exam:  Cachectic but not unwell appearing NAD HEENT: Unremarkable Neck:  7 cm JVD, no thyromegally Lymphatics:  No adenopathy Back:  No CVA tenderness Lungs:  Clear with no wheezes HEART:  Regular rate rhythm,  no murmurs, no rubs, no clicks Abd:  soft, positive bowel sounds, no organomegally, no rebound, no guarding Ext:  2 plus pulses, no edema, no cyanosis, no clubbing Skin:  No rashes no nodules Neuro:  CN II through XII intact, motor grossly intact  DEVICE  Normal device function.  See PaceArt for details. Optivol up  Assess/Plan: 1. Chronic systolic heart failure - his symptoms are class 2B. He will continue his current meds.  2. VF/VT - he has had no additonal arrhythmias since I saw him last.  No change in meds. 3. ICD - his Medtronic device appears to be working normally. Will follow. 4. Cachexia - his appetite is improving although his weight has not improved. I have encouraged him to eat more high density foods.  Mikle Bosworth.D.

## 2017-06-04 NOTE — Assessment & Plan Note (Signed)
Patient with some significant intermittent constipation and impaction, the [facility] emergency department about once a month for this issue. CT scan of his abdomen showed thickening of his rectum. Last screening colonoscopy was in 2010 and normal except for one sessile polyp which was removed. Given his symptoms of weight loss and recurrent constipation, I offered him a repeat referral to [facility] for at least a sigmoidoscopy to rule out colon cancer. Patient would prefer watchful waiting for now, he is very frail. We talked about increasing MiraLAX to 3 times a day as needed if he becomes constipated again and reiterated the importance of daily senna.

## 2017-06-04 NOTE — Progress Notes (Signed)
   Assessment and Plan:  See Encounters tab for problem-based medical decision making.   __________________________________________________________  HPI:   82 year old man here for follow-up of diabetes. It has been about 10 months since I have seen him in clinic. He was hospitalized in July for a couple kidded urinary tract infection related to his chronic bladder outlet obstruction which was complicated by Escherichia coli bacteremia. He completed a course of antibiotics and was discharged to a skilled nursing facility for rehabilitation. He was therefore about 3 weeks and then discharged back to home. He reports doing very well at home over the last 1 month. He is independent in all activities of daily living but relies on his sister to help him with transportation, shopping, doctor's appointments, etc. He reports good compliance with his medications, though he admits that he does get confused sometimes. He was started on a sliding scale short acting insulin at the rehabilitation center. He says he only uses it when he needs it, however he is not checking his blood sugars at home. Says is only done it once in the last 3 weeks. I think he is waiting until he feels symptomatically hyperglycemic before even checking his blood sugar. He reports eating normal diet at home, lunch is his largest meal. He recently started boost supplements which she takes 1 a day. Denies any chest pain or dyspnea on exertion. He sleeping well, lies flat at night. No PND or orthopnea. We talked about his chronic issues with constipation. He says normally he has 2 bowel movements a day which are usually loose. About once a month he'll go several days without having any bowel movements and then gets disimpacted in the emergency department. Denies any abdominal pain. No nausea or vomiting. Takes MiraLAX every day with good compliance.  __________________________________________________________  Problem List: Patient Active  Problem List   Diagnosis Date Noted  . Weight loss 12/11/2016    Priority: High  . Urinary retention 01/13/2016    Priority: High  . [name] (chronic kidney disease) stage 3, GFR 30-59 ml/min 12/04/2014    Priority: High  . Chronic systolic congestive heart failure ([place]) 12/25/2007    Priority: High  . Type 2 diabetes mellitus with peripheral vascular disease ([place]) 01/01/1989    Priority: High  . Constipation 03/10/2009    Priority: Medium  . Coronary atherosclerosis 12/25/2007    Priority: Medium  . Advanced care planning/counseling discussion 12/11/2016    Priority: Low  . Healthcare maintenance 11/22/2015    Priority: Low  . Automatic implantable cardioverter-defibrillator in situ 01/16/2012    Priority: Low  . Hyperlipidemia 01/02/2007    Priority: Low    Medications: Reconciled today in Epic __________________________________________________________  Physical Exam:  Vital Signs: Vitals:   06/04/17 1018  BP: 123/75  Pulse: 75  Temp: 97.6 F (36.4 C)  TempSrc: Oral  SpO2: 100%  Weight: 127 lb 14.4 oz (58 kg)  Height: 5\' 8"  (1.727 m)    Gen: Chronically ill appearing man, very thin and frail CV: RRR, distant heart sounds Pulm: Normal effort, CTA throughout, no wheezing Abd: Soft, Scaphoid, NT, ND. Ext: Warm, no edema, normal joints Skin: No atypical appearing moles. No rashes. Stasis changes on both legs.

## 2017-06-04 NOTE — Patient Instructions (Addendum)
Medication Instructions:  Your physician recommends that you continue on your current medications as directed. Please refer to the Current Medication list given to you today.   Labwork: None ordered  Testing/Procedures: None ordered  Follow-Up: Your physician wants you to follow-up in: One Year with Dr. Lovena Le. You will receive a reminder letter in the mail two months in advance. If you don't receive a letter, please call our office to schedule the follow-up appointment.  Remote monitoring is used to monitor your ICD from home. This monitoring reduces the number of office visits required to check your device to one time per year. It allows [place] to keep an eye on the functioning of your device to ensure it is working properly. You are scheduled for a device check from home on 07/05/2017. You may send your transmission at any time that day. If you have a wireless device, the transmission will be sent automatically. After your physician reviews your transmission, you will receive a postcard with your next transmission date. .  Any Other Special Instructions Will Be Listed Below (If Applicable).     If you need a refill on your cardiac medications before your next appointment, please call your pharmacy.

## 2017-06-04 NOTE — Assessment & Plan Note (Signed)
Last A1c 12% in July, a little too early to recheck today. He brought his meter, but he had no readings stored in it. I was treating him with Lantus 20 units daily, the rehabilitation center after his last hospitalization also started him on a NovoLog sliding scale which he has not used very much at home. Encouraged patient to check blood sugars at least once a day after lunch which is his largest meal and then use the sliding scale for additional coverage if his blood sugars over 150. We will continue with the Lantus 20 units daily. Follow-up in 2 months and bring his meter so we can look for highs and lows. He does have a history of hypoglycemia and [name] which limits our other medication options.

## 2017-06-04 NOTE — Patient Instructions (Signed)
1. Check your blood sugar once a day after lunch. Use the Novolog short acting insulin if your sugar is above 150.   2. Come back in 2 months so we can check on your blood sugars.  3. For your bowels, continue senna and miralax every day. If you become constipated, take miralax 2 or 3 more times until you have a bowel movement. If this keeps happening, we can set you up with a GI doctor appointment.

## 2017-06-05 ENCOUNTER — Other Ambulatory Visit: Payer: Self-pay | Admitting: Licensed Clinical Social Worker

## 2017-06-05 NOTE — Patient Outreach (Signed)
[facility]) Care Management  06/05/2017  [name] December 19, 1930 [id number]  Assessment-CSW completed second outreach attempt today. CSW unable to reach patient successfully. CSW left another HIPPA compliant voice message encouraging patient to return call once available in order to assist with social work needs.  Plan-CSW will await return call or complete an additional outreach if needed.  Eula Fried, BSW, MSW, [email or web address] Phone: [phone] Fax: [phone]

## 2017-06-06 DIAGNOSIS — Z961 Presence of intraocular lens: Secondary | ICD-10-CM | POA: Diagnosis not present

## 2017-06-06 DIAGNOSIS — Z794 Long term (current) use of insulin: Secondary | ICD-10-CM | POA: Diagnosis not present

## 2017-06-06 DIAGNOSIS — E119 Type 2 diabetes mellitus without complications: Secondary | ICD-10-CM | POA: Diagnosis not present

## 2017-06-06 DIAGNOSIS — H04123 Dry eye syndrome of bilateral lacrimal glands: Secondary | ICD-10-CM | POA: Diagnosis not present

## 2017-06-07 ENCOUNTER — Other Ambulatory Visit: Payer: Self-pay | Admitting: Licensed Clinical Social Worker

## 2017-06-07 DIAGNOSIS — I2510 Atherosclerotic heart disease of native coronary artery without angina pectoris: Secondary | ICD-10-CM | POA: Diagnosis not present

## 2017-06-07 DIAGNOSIS — E1152 Type 2 diabetes mellitus with diabetic peripheral angiopathy with gangrene: Secondary | ICD-10-CM | POA: Diagnosis not present

## 2017-06-07 DIAGNOSIS — J439 Emphysema, unspecified: Secondary | ICD-10-CM | POA: Diagnosis not present

## 2017-06-07 DIAGNOSIS — R339 Retention of urine, unspecified: Secondary | ICD-10-CM | POA: Diagnosis not present

## 2017-06-07 DIAGNOSIS — E1122 Type 2 diabetes mellitus with diabetic chronic kidney disease: Secondary | ICD-10-CM | POA: Diagnosis not present

## 2017-06-07 DIAGNOSIS — I5022 Chronic systolic (congestive) heart failure: Secondary | ICD-10-CM | POA: Diagnosis not present

## 2017-06-07 DIAGNOSIS — N183 Chronic kidney disease, stage 3 (moderate): Secondary | ICD-10-CM | POA: Diagnosis not present

## 2017-06-07 DIAGNOSIS — I130 Hypertensive heart and chronic kidney disease with heart failure and stage 1 through stage 4 chronic kidney disease, or unspecified chronic kidney disease: Secondary | ICD-10-CM | POA: Diagnosis not present

## 2017-06-07 DIAGNOSIS — Z872 Personal history of diseases of the skin and subcutaneous tissue: Secondary | ICD-10-CM | POA: Diagnosis not present

## 2017-06-07 NOTE — Patient Outreach (Signed)
[facility]) Care Management  06/07/2017  [name] [birth year] [id number]  Assessment-CSW completed third and final outreach attempt today. CSW unable to reach patient successfully. CSW left a HIPPA compliant voice message encouraging patient to return call once available.  Plan-CSW will now mail outreach barrier letter to patient and wait 10 business days to hear back from patient before completing case closure.   Eula Fried, BSW, MSW, [email or web address] Phone: [phone] Fax: [phone]

## 2017-06-09 ENCOUNTER — Other Ambulatory Visit: Payer: Self-pay | Admitting: Student in an Organized Health Care Education/Training Program

## 2017-06-20 ENCOUNTER — Other Ambulatory Visit: Payer: Self-pay

## 2017-06-20 NOTE — Patient Outreach (Signed)
[facility]) Care Management  06/20/2017  DAISHAUN AYRE 1931/03/06 [id number]   RNCM received referral on 9/26 - for follow up. 83 year old with history of heart failure, DM, [name], constipation. RNCM called to scheduled appointment for home visit. Client reports no issues or concerns.   Plan: home visit scheduled in 2 weeks.  Thea Silversmith, RN, MSN, [place] Coordinator Cell: [phone]

## 2017-06-27 ENCOUNTER — Other Ambulatory Visit: Payer: Self-pay | Admitting: Licensed Clinical Social Worker

## 2017-06-27 NOTE — Patient Outreach (Signed)
[facility]) Care Management  06/27/2017  DILYN SMILES 16-Dec-1930 [id number]  Assessment- CSW will close case at this time. CSW has been unable to establish contact with patient and has mailed outreach barrier letter and waited 10 business days without hearing back.  Plan-CSW will notify [facility] RNCM of social work discharge.   Eula Fried, BSW, MSW, [email or web address] Phone: [phone] Fax: [phone]

## 2017-07-04 ENCOUNTER — Other Ambulatory Visit: Payer: Self-pay

## 2017-07-04 ENCOUNTER — Ambulatory Visit: Payer: Self-pay

## 2017-07-04 NOTE — Patient Outreach (Signed)
[facility]) Care Management  07/04/2017  [name] Aug 18, 1931 [id number]   Home visit scheduled. RNCM called prior to visit. No answer. HIPPA compliant message left.   Client lives in a locked independent living facility. RNCM was unable to get in. RNCM attempted to call several times while at the facility, but was unsuccessful. No answer. HIPPA compliant message left.  Plan: outreach to client within the next week.  Thea Silversmith, RN, MSN, [place] Coordinator Cell: [phone]

## 2017-07-05 ENCOUNTER — Telehealth: Payer: Self-pay | Admitting: Cardiology

## 2017-07-05 ENCOUNTER — Encounter: Payer: Medicare HMO | Admitting: *Deleted

## 2017-07-05 NOTE — Telephone Encounter (Signed)
LMOVM reminding pt to send remote transmission.   

## 2017-07-06 ENCOUNTER — Encounter: Payer: Self-pay | Admitting: Cardiology

## 2017-07-07 ENCOUNTER — Other Ambulatory Visit: Payer: Self-pay | Admitting: Internal Medicine

## 2017-07-10 ENCOUNTER — Other Ambulatory Visit: Payer: Self-pay

## 2017-07-10 DIAGNOSIS — E113313 Type 2 diabetes mellitus with moderate nonproliferative diabetic retinopathy with macular edema, bilateral: Secondary | ICD-10-CM | POA: Diagnosis not present

## 2017-07-10 DIAGNOSIS — Z961 Presence of intraocular lens: Secondary | ICD-10-CM | POA: Diagnosis not present

## 2017-07-10 DIAGNOSIS — B308 Other viral conjunctivitis: Secondary | ICD-10-CM | POA: Diagnosis not present

## 2017-07-10 DIAGNOSIS — H35033 Hypertensive retinopathy, bilateral: Secondary | ICD-10-CM | POA: Diagnosis not present

## 2017-07-10 NOTE — Patient Outreach (Signed)
[facility]) Care Management  07/10/2017  PHILLIPE CLEMON 02/25/1931 [id number]   Care Coordination-RNCM attempted home visit last week-Unable to reach client. Call today to follow up. No answer. HIPPA compliant message left.  Thea Silversmith, RN, MSN, [place] Coordinator Cell: [phone]

## 2017-07-12 ENCOUNTER — Other Ambulatory Visit: Payer: Self-pay

## 2017-07-12 NOTE — Patient Outreach (Signed)
[facility]) Care Management  07/12/2017  AZRIEL JAKOB 1931-07-21 [id number]   Care Coordination-RNCM attempted home visit last week-Unable to reach client. Call today to follow up. No answer. HIPPA compliant message left. RNCM attempted one home visit and two telephonic attempts with no response.  Plan: per policy/procedure, RNCM will send outreach letter.  Thea Silversmith, RN, MSN, [place] Coordinator Cell: [phone]

## 2017-07-19 NOTE — Progress Notes (Signed)
No ICM remote transmission received for 07/10/2017 and next ICM transmission scheduled for 08/02/2017.

## 2017-07-31 DIAGNOSIS — Z961 Presence of intraocular lens: Secondary | ICD-10-CM | POA: Diagnosis not present

## 2017-07-31 DIAGNOSIS — B309 Viral conjunctivitis, unspecified: Secondary | ICD-10-CM | POA: Diagnosis not present

## 2017-07-31 DIAGNOSIS — E113293 Type 2 diabetes mellitus with mild nonproliferative diabetic retinopathy without macular edema, bilateral: Secondary | ICD-10-CM | POA: Diagnosis not present

## 2017-07-31 DIAGNOSIS — H35033 Hypertensive retinopathy, bilateral: Secondary | ICD-10-CM | POA: Diagnosis not present

## 2017-08-02 ENCOUNTER — Ambulatory Visit (INDEPENDENT_AMBULATORY_CARE_PROVIDER_SITE_OTHER): Payer: Medicare HMO

## 2017-08-02 DIAGNOSIS — Z95810 Presence of automatic (implantable) cardiac defibrillator: Secondary | ICD-10-CM

## 2017-08-02 DIAGNOSIS — I5022 Chronic systolic (congestive) heart failure: Secondary | ICD-10-CM | POA: Diagnosis not present

## 2017-08-03 ENCOUNTER — Telehealth: Payer: Self-pay

## 2017-08-03 NOTE — Telephone Encounter (Signed)
Remote ICM transmission received.  Attempted call to patient and left detailed message per DPR regarding transmission and next ICM scheduled for 09/04/2017.  Advised to return call for any fluid symptoms or questions.

## 2017-08-03 NOTE — Progress Notes (Signed)
EPIC Encounter for ICM Monitoring  Patient Name: [name] is a 83 y.o. male Date: 08/03/2017 Primary Care Physican: Axel Filler, MD Primary Cardiologist:Taylor Electrophysiologist: Druscilla Brownie Weight:unknown Bi-V Pacing: 95.8%      Attempted call to patient and unable to reach.  Left detailed message regarding transmission.  Transmission reviewed. Marland Kitchen   Optivol: Thoracic impedance trending just below baseline.  Prescribed dosage: Furosemide 40 mg 1 tablet twice a day.   Labs: 06/01/2017 Creatinine 2.10, BUN 34, Potassium 4.7, Sodium 139, EGFR 27-32 04/25/2017 Creatinine 1.92, BUN 56, Potassium 4.4, Sodium 131, EGFR 30-35 04/24/2017 Creatinine 2.10, BUN 56, Potassium 4.3, Sodium 131, EGFR 27-32  04/23/2017 Creatinine 2.59, BUN 64, Potassium 4.4, Sodium 133, EGFR 21-25  04/22/2017 Creatinine 2.54, BUN 51, Potassium 3.8, Sodium 133, EGFR 22-25  04/21/2017 Creatinine 2.75, BUN 51, Potassium 4.0, Sodium 130, EGFR 20-23  12/11/2016 Creatinine 1.68, BUN 33, Potassium 4.3, Sodium 138, EGFR 37-43 04/17/2016 Creatinine 2.08, BUN 37, Potassium 4.1, Sodium 135, EGFR 28-32  Recommendations: Left voice mail with ICM number and encouraged to call if experiencing any fluid symptoms.  Follow-up plan: ICM clinic phone appointment on 09/04/2017.    Copy of ICM check sent to Dr. Lovena Le.   3 month ICM trend: 08/02/2017    1 Year ICM trend:       [name], RN 08/03/2017 11:56 AM

## 2017-08-06 ENCOUNTER — Ambulatory Visit (INDEPENDENT_AMBULATORY_CARE_PROVIDER_SITE_OTHER): Payer: Medicare HMO | Admitting: Student in an Organized Health Care Education/Training Program

## 2017-08-06 ENCOUNTER — Encounter: Payer: Self-pay | Admitting: Student in an Organized Health Care Education/Training Program

## 2017-08-06 ENCOUNTER — Other Ambulatory Visit: Payer: Self-pay

## 2017-08-06 ENCOUNTER — Emergency Department (HOSPITAL_COMMUNITY)
Admission: EM | Admit: 2017-08-06 | Discharge: 2017-08-06 | Disposition: A | Discharge status: Home / Self Care | Payer: Medicare HMO | Attending: Emergency Medicine | Admitting: Emergency Medicine

## 2017-08-06 ENCOUNTER — Encounter (HOSPITAL_COMMUNITY): Payer: Self-pay

## 2017-08-06 VITALS — BP 151/78 | HR 79 | Temp 98.2°F | Ht 68.0 in | Wt 128.3 lb

## 2017-08-06 DIAGNOSIS — Z950 Presence of cardiac pacemaker: Secondary | ICD-10-CM | POA: Diagnosis not present

## 2017-08-06 DIAGNOSIS — Z79899 Other long term (current) drug therapy: Secondary | ICD-10-CM | POA: Diagnosis not present

## 2017-08-06 DIAGNOSIS — Z87891 Personal history of nicotine dependence: Secondary | ICD-10-CM | POA: Diagnosis not present

## 2017-08-06 DIAGNOSIS — I130 Hypertensive heart and chronic kidney disease with heart failure and stage 1 through stage 4 chronic kidney disease, or unspecified chronic kidney disease: Secondary | ICD-10-CM | POA: Diagnosis not present

## 2017-08-06 DIAGNOSIS — E119 Type 2 diabetes mellitus without complications: Secondary | ICD-10-CM | POA: Diagnosis not present

## 2017-08-06 DIAGNOSIS — Z794 Long term (current) use of insulin: Secondary | ICD-10-CM

## 2017-08-06 DIAGNOSIS — Z7982 Long term (current) use of aspirin: Secondary | ICD-10-CM | POA: Diagnosis not present

## 2017-08-06 DIAGNOSIS — K5900 Constipation, unspecified: Secondary | ICD-10-CM

## 2017-08-06 DIAGNOSIS — I5022 Chronic systolic (congestive) heart failure: Secondary | ICD-10-CM | POA: Insufficient documentation

## 2017-08-06 DIAGNOSIS — R634 Abnormal weight loss: Secondary | ICD-10-CM

## 2017-08-06 DIAGNOSIS — I252 Old myocardial infarction: Secondary | ICD-10-CM | POA: Insufficient documentation

## 2017-08-06 DIAGNOSIS — Z955 Presence of coronary angioplasty implant and graft: Secondary | ICD-10-CM | POA: Insufficient documentation

## 2017-08-06 DIAGNOSIS — E1151 Type 2 diabetes mellitus with diabetic peripheral angiopathy without gangrene: Secondary | ICD-10-CM | POA: Diagnosis not present

## 2017-08-06 DIAGNOSIS — N183 Chronic kidney disease, stage 3 (moderate): Secondary | ICD-10-CM | POA: Insufficient documentation

## 2017-08-06 DIAGNOSIS — R040 Epistaxis: Secondary | ICD-10-CM | POA: Diagnosis not present

## 2017-08-06 DIAGNOSIS — N189 Chronic kidney disease, unspecified: Secondary | ICD-10-CM

## 2017-08-06 LAB — POCT GLYCOSYLATED HEMOGLOBIN (HGB A1C): HEMOGLOBIN A1C: 9.6

## 2017-08-06 LAB — GLUCOSE, CAPILLARY: GLUCOSE-CAPILLARY: 215 mg/dL — AB (ref 65–99)

## 2017-08-06 MED ORDER — GLUCOSE BLOOD VI STRP: ORAL_STRIP | 5 refills | Status: DC

## 2017-08-06 MED ORDER — INSULIN GLARGINE 100 UNIT/ML ~~LOC~~ SOLN: 25.0000 [IU] | Freq: Every day | SUBCUTANEOUS | 5 refills | Status: DC

## 2017-08-06 NOTE — Assessment & Plan Note (Signed)
Hemoglobin A1c 9.6%.  This is an improvement from last reading of 12%.  However still above goal.  Patient has not been checking blood sugars at home because he cannot afford the test strips.  Says that there is a $50 co-pay on them.  Currently uses Lantus 20 units daily.  His chronic kidney disease prevents the use of metformin.  Plan for now is to increase Lantus to 25 units daily.  I am going to work on finding a test strip that is better covered by his insurance so that he can check glucose levels at least twice daily.  Then I would like to add back mealtime coverage with NovoLog.

## 2017-08-06 NOTE — Progress Notes (Signed)
   Assessment and Plan:  See Encounters tab for problem-based medical decision making.   __________________________________________________________  HPI:   83 year old man here for follow-up of diabetes.  Patient reports doing very well at home.  He reports compliance with Lantus 20 units daily.  He reports he is not compliant with checking his blood sugars at home because he cannot afford his test strips.  Also stopped using NovoLog about 3 weeks ago, around the same time he stopped checking blood sugars.  Denies any episodes of hyper or hypoglycemia.  No recent visits to the emergency department.  Reports his constipation is much improved.  No fevers or chills, chest pain, dyspnea on exertion.  He reports good appetite, says that he went to [facility] yesterday and had 6 pancakes with eggs and tea.  He reports eating a lot of food, also using boost several times a day.  He was by himself and is independent his activities of daily living.  He uses the bus for transportation.  He has a girlfriend, currently not sexually active because of erectile dysfunction.   __________________________________________________________  Problem List: Patient Active Problem List   Diagnosis Date Noted  . Weight loss 12/11/2016    Priority: High  . Urinary retention 01/13/2016    Priority: High  . [name] (chronic kidney disease) stage 3, GFR 30-59 ml/min (HCC) 12/04/2014    Priority: High  . Chronic systolic congestive heart failure ([place]) 12/25/2007    Priority: High  . Type 2 diabetes mellitus with peripheral vascular disease ([place]) 01/01/1989    Priority: High  . Constipation 03/10/2009    Priority: Medium  . Coronary atherosclerosis 12/25/2007    Priority: Medium  . Dry eye syndrome of both eyes 06/06/2017    Priority: Low  . Advanced care planning/counseling discussion 12/11/2016    Priority: Low  . Healthcare maintenance 11/22/2015    Priority: Low  . Automatic implantable cardioverter-defibrillator in  situ 01/16/2012    Priority: Low  . Hyperlipidemia 01/02/2007    Priority: Low    Medications: Reconciled today in Epic __________________________________________________________  Physical Exam:  Vital Signs: Vitals:   08/06/17 1001  BP: (!) 151/78  Pulse: 79  Temp: 98.2 F (36.8 C)  TempSrc: Oral  SpO2: 100%  Weight: 128 lb 4.8 oz (58.2 kg)  Height: 5\' 8"  (1.727 m)    Gen: Chronically ill-appearing man, no distress CV: RRR, distant heart sounds Pulm: Normal effort, CTA throughout, no wheezing Abd: Soft, NT, ND, very thin Ext: Warm, trace bilateral edema, normal joints

## 2017-08-06 NOTE — Assessment & Plan Note (Signed)
Weight today is 128 pounds, up 1 pound from our last visit 2 months ago.  BMI stable around 19.  On exam he appears very frail and thin.  He has good access to food, says he eats 3 meals a day, listed several of his last meals for me.  Also uses boost supplements a couple times a day, though the cost is somewhat limiting.  We have done pan imaging and have not found an underlying malignancy.  Instead we are doing watchful waiting.

## 2017-08-06 NOTE — Assessment & Plan Note (Signed)
This is been a significant problem in the past leading to many emergency department visits and manual disimpaction.  Currently he is doing much better with an aggressive regimen of daily MiraLAX.  I advised continuing with this bowel regimen and letting us know soon if he has any problems.

## 2017-08-06 NOTE — Patient Outreach (Signed)
[facility]) Care Management  08/06/2017  CLIVE PARCEL December 22, 1930 [id number]   RNCM attempted to reach client numerous times with no response. Outreach letter sent with no response.   RNCM noted client presented to emergency room due to nose bleed. RNCM called to follow up with no answer. HIPPA compliant message left.  Plan: if no return call will close case.   Thea Silversmith, RN, MSN, [place] Coordinator Cell: [phone]

## 2017-08-06 NOTE — ED Triage Notes (Signed)
Per Pt, Pt was living IM Clinic downstairs when he walked to the bus stop. While waiting for the bus, pt stated that his nose started bleeding. Pt no longer having nose bleed at this time.

## 2017-08-06 NOTE — ED Provider Notes (Signed)
[place] EMERGENCY DEPARTMENT Provider Note   CSN: [id number] Arrival date & time: 08/06/17  1139     History   Chief Complaint Chief Complaint  Patient presents with  . Epistaxis    HPI Robert SAUTTER is a 83 y.o. male.  HPI  83 year old male presents with a right-sided nosebleed.  He states he was walking to the bus stop and as he was getting out of the bus someone pointed out that the right side of his nose bleeding.  He states it was trickling.  No nasal pain or trauma.  No lightheadedness, headache, or recent illness.  He states he put some paper towel in his nose and it stopped after a maximum of 2 or 3 minutes.  He takes a baby aspirin but no other anticoagulants.  No bleeding since arrival to the ER while in the  Past Medical History:  Diagnosis Date  . Adenomatous colon polyp 02/14/2012  . BBB (bundle branch block)    right  . Carotid stenosis    a. s/p L carotid stent 2004;  b. Carotid US (09/2014): Bilateral ICA 1-39%, left ECA >59%, normal subclavian bilaterally, occluded left vertebral >> FU 2 years  . Chronic kidney disease   . Chronic systolic CHF (congestive heart failure) (HCC)    a. ischemic CM EF 15-20%;  b. s/p AICD 05/24/04;  c. Echo 7/06: EF 30-40%, mild reduced RVSF, d. Echo 12/2015 EF 35-40%  . Elevated PSA   . HTN (hypertension)   . Hyperlipidemia   . ICD (implantable cardiac defibrillator) in place 12-25-2012   MDT CRTD upgrade by Dr Lovena Le  . Myocardial infarction ([place]) 1990  . Pericardial effusion    Echocardiogram (09/2014): EF 25% with distal anterior, distal inferior, distal lateral and apical akinesis, grade 1 diastolic dysfunction, very mild aortic stenosis (mean 7 mmHg) - this may be depressed due to low EF (2-D images suggest mild to moderate aortic stenosis), large pericardial effusion, no RA collapse  . PVD (peripheral vascular disease) ([place])    s/p L carotid PTCA/stent 2004  . Transient ischemic attack   . Type II  diabetes mellitus [facility])     Patient Active Problem List   Diagnosis Date Noted  . Dry eye syndrome of both eyes 06/06/2017  . Weight loss 12/11/2016  . Advanced care planning/counseling discussion 12/11/2016  . Urinary retention 01/13/2016  . Healthcare maintenance 11/22/2015  . [name] (chronic kidney disease) stage 3, GFR 30-59 ml/min (HCC) 12/04/2014  . Automatic implantable cardioverter-defibrillator in situ 01/16/2012  . Constipation 03/10/2009  . Coronary atherosclerosis 12/25/2007  . Chronic systolic congestive heart failure ([place]) 12/25/2007  . Hyperlipidemia 01/02/2007  . Type 2 diabetes mellitus with peripheral vascular disease ([place]) 01/01/1989    Past Surgical History:  Procedure Laterality Date  . BIV ICD UPGRADE  12/25/2012   MDT CRTD upgrade by Dr Lovena Le for ischemic cardiomyopathy and worsening conduction system disease  . CARDIAC CATHETERIZATION  06/2003,  01/2004  . CARDIAC DEFIBRILLATOR PLACEMENT  05/24/2004   Implantation of a MDT single-chamber defibrillator  . CAROTID STENT  09/11/2003   Percutaneous transluminal angioplasty and stent placement of the left internal carotid artery.  Marland Kitchen CATARACT EXTRACTION W/ INTRAOCULAR LENS  IMPLANT, BILATERAL Bilateral ~ 2010  . CORONARY ANGIOPLASTY WITH STENT PLACEMENT  1990   "2" (12/25/2012)  . INSERT / REPLACE / REMOVE PACEMAKER         Home Medications    Prior to Admission medications   Medication  Sig Start Date End Date Taking? Authorizing Provider  aspirin EC 81 MG tablet Take 81 mg by mouth daily.    [provider]  atorvastatin (LIPITOR) 40 MG tablet TAKE 1 TABLET(40 MG) BY MOUTH DAILY AT 6 PM 04/14/16   Axel Filler, MD  B-D ULTRAFINE III SHORT PEN 31G X 8 MM MISC USE AS DIRECTED ONCE DAILY FOR INSULIN INJECTION 05/08/17   Axel Filler, MD  Blood Glucose Monitoring Suppl (ACCU-CHEK AVIVA PLUS) w/Device KIT Check finger stick glucose once daily 05/11/17   Axel Filler, MD    carvedilol (COREG) 12.5 MG tablet TAKE 1 TABLET BY MOUTH TWICE DAILY 05/07/17   Evans Lance, MD  fluorometholone (FML) 0.1 % ophthalmic suspension Place 2 drops into both eyes daily.  06/23/16   [provider]  furosemide (LASIX) 40 MG tablet TAKE 1 TABLET BY MOUTH TWICE DAILY 07/09/17   Evans Lance, MD  glucose blood (ACCU-CHEK AVIVA PLUS) test strip Check blood sugar up to 3 times a day 08/06/17   Axel Filler, MD  insulin glargine (LANTUS) 100 UNIT/ML injection Inject 0.25 mLs (25 Units total) at bedtime into the skin. 08/06/17   Axel Filler, MD  lactose free nutrition (BOOST) LIQD Take 237 mLs by mouth daily.    [provider]  pantoprazole (PROTONIX) 40 MG tablet TAKE 1 TABLET(40 MG) BY MOUTH DAILY 05/31/17   Axel Filler, MD  polyethylene glycol [facility] / Floria Raveling) packet Take 17 g by mouth daily. 02/06/17   Charlesetta Shanks, MD  senna-docusate (SENOKOT-S) 8.6-50 MG tablet Take 2 tablets by mouth daily. 05/04/16   Konrad Felix, PA  tamsulosin (FLOMAX) 0.4 MG CAPS capsule Take 0.4 mg by mouth daily after supper.    [provider]  VENTOLIN HFA 108 (90 Base) MCG/ACT inhaler INHALE 2 PUFFS INTO THE LUNGS EVERY 6 HOURS AS NEEDED FOR WHEEZING OR SHORTNESS OF BREATH 03/15/16   Axel Filler, MD    Family History Family History  Problem Relation Age of Onset  . Diabetes Mother   . Diabetes Brother   . Heart attack Neg Hx   . Stroke Neg Hx     Social History Social History   Tobacco Use  . Smoking status: Former Smoker    Types: Cigarettes    Last attempt to quit: 12/27/1967    Years since quitting: 49.6  . Smokeless tobacco: Never Used  Substance Use Topics  . Alcohol use: No    Alcohol/week: 0.0 oz    Comment: 12/25/2012 "quit all alcohol 60 yr ago"  . Drug use: No     Allergies   Patient has no known allergies.   Review of Systems Review of Systems  HENT: Positive for nosebleeds. Negative for sore  throat.   Respiratory: Negative for shortness of breath.   Cardiovascular: Negative for chest pain.  Neurological: Negative for dizziness, light-headedness and headaches.  All other systems reviewed and are negative.    Physical Exam Updated Vital Signs BP (!) 143/85 (BP Location: Right Arm)   Pulse 86   Temp (!) 97.5 F (36.4 C) (Oral)   Resp 16   Ht _0  (1.727 m)   Wt 58.1 kg (128 lb)   SpO2 100%   BMI 19.46 kg/m   Physical Exam  Constitutional: He appears well-developed and well-nourished.  HENT:  Head: Normocephalic and atraumatic.  Right Ear: External ear normal.  Left Ear: External ear normal.  Nose: Nose normal. No  nose lacerations, sinus tenderness, nasal deformity, septal deviation or nasal septal hematoma. No epistaxis.  Small residual clot in the distal right nare, when this is removed there is no recurrent bleeding or obvious source of bleeding  Eyes: Right eye exhibits no discharge. Left eye exhibits no discharge.  Neck: Neck supple.  Pulmonary/Chest: Effort normal.  Musculoskeletal: He exhibits no edema.  Neurological: He is alert.  Skin: Skin is warm and dry.  Nursing note and vitals reviewed.    ED Treatments / Results  Labs (all labs ordered are listed, but only abnormal results are displayed) Labs Reviewed - No data to display  EKG  EKG Interpretation None       Radiology No results found.  Procedures Procedures (including critical care time)  Medications Ordered in ED Medications - No data to display   Initial Impression / Assessment and Plan / ED Course  I have reviewed the triage vital signs and the nursing notes.  Pertinent labs & imaging results that were available during my care of the patient were reviewed by me and considered in my medical decision making (see chart for details).     No further or recurrent epistaxis.  No clear source or obvious area to cauterize.  No recurrence of bleeding.  His vital signs are reviewed  and unremarkable.  No hypertensive emergency.  He appears stable for discharge home, counseled on best way to help stop nosebleeds and discussed return precautions.  Final Clinical Impressions(s) / ED Diagnoses   Final diagnoses:  Right-sided epistaxis    ED Discharge Orders    None       Sherwood Gambler, MD 08/06/17 1212

## 2017-08-06 NOTE — Assessment & Plan Note (Signed)
Patient with chronic heart failure with reduced ejection fraction 20%, currently doing well.  Follows up with the cardiology clinic and has remote monitoring.  Plan is to continue with carvedilol 12.5 mg twice daily, furosemide 40 mg daily, aspirin and atorvastatin.

## 2017-08-06 NOTE — Patient Instructions (Signed)
Increase Lantus to 25 units once a day.

## 2017-08-08 ENCOUNTER — Other Ambulatory Visit: Payer: Self-pay | Admitting: Internal Medicine

## 2017-08-10 ENCOUNTER — Other Ambulatory Visit: Payer: Self-pay

## 2017-08-10 NOTE — Patient Outreach (Signed)
[facility]) Care Management  08/10/2017  [name] [birth year] [id number]   RNCM attempted to reach client numerous times with no response. Outreach letter sent with no response.   RNCM noted client presented to emergency room due to nose bleed. RNCM called to follow up with no answer. HIPPA compliant message left. No return response from any attempts at outreach.  Plan: close case.  Thea Silversmith, RN, MSN, [place] Coordinator Cell: [phone]

## 2017-08-27 ENCOUNTER — Other Ambulatory Visit: Payer: Self-pay | Admitting: Student in an Organized Health Care Education/Training Program

## 2017-08-29 IMAGING — DX DG CHEST 1V PORT
1 series · 1 of 1 positions shown · non-contrast
Comparison: None.

CLINICAL DATA: Intubation after cardiac arrest

EXAM:
PORTABLE CHEST 1 VIEW

[chest ap]
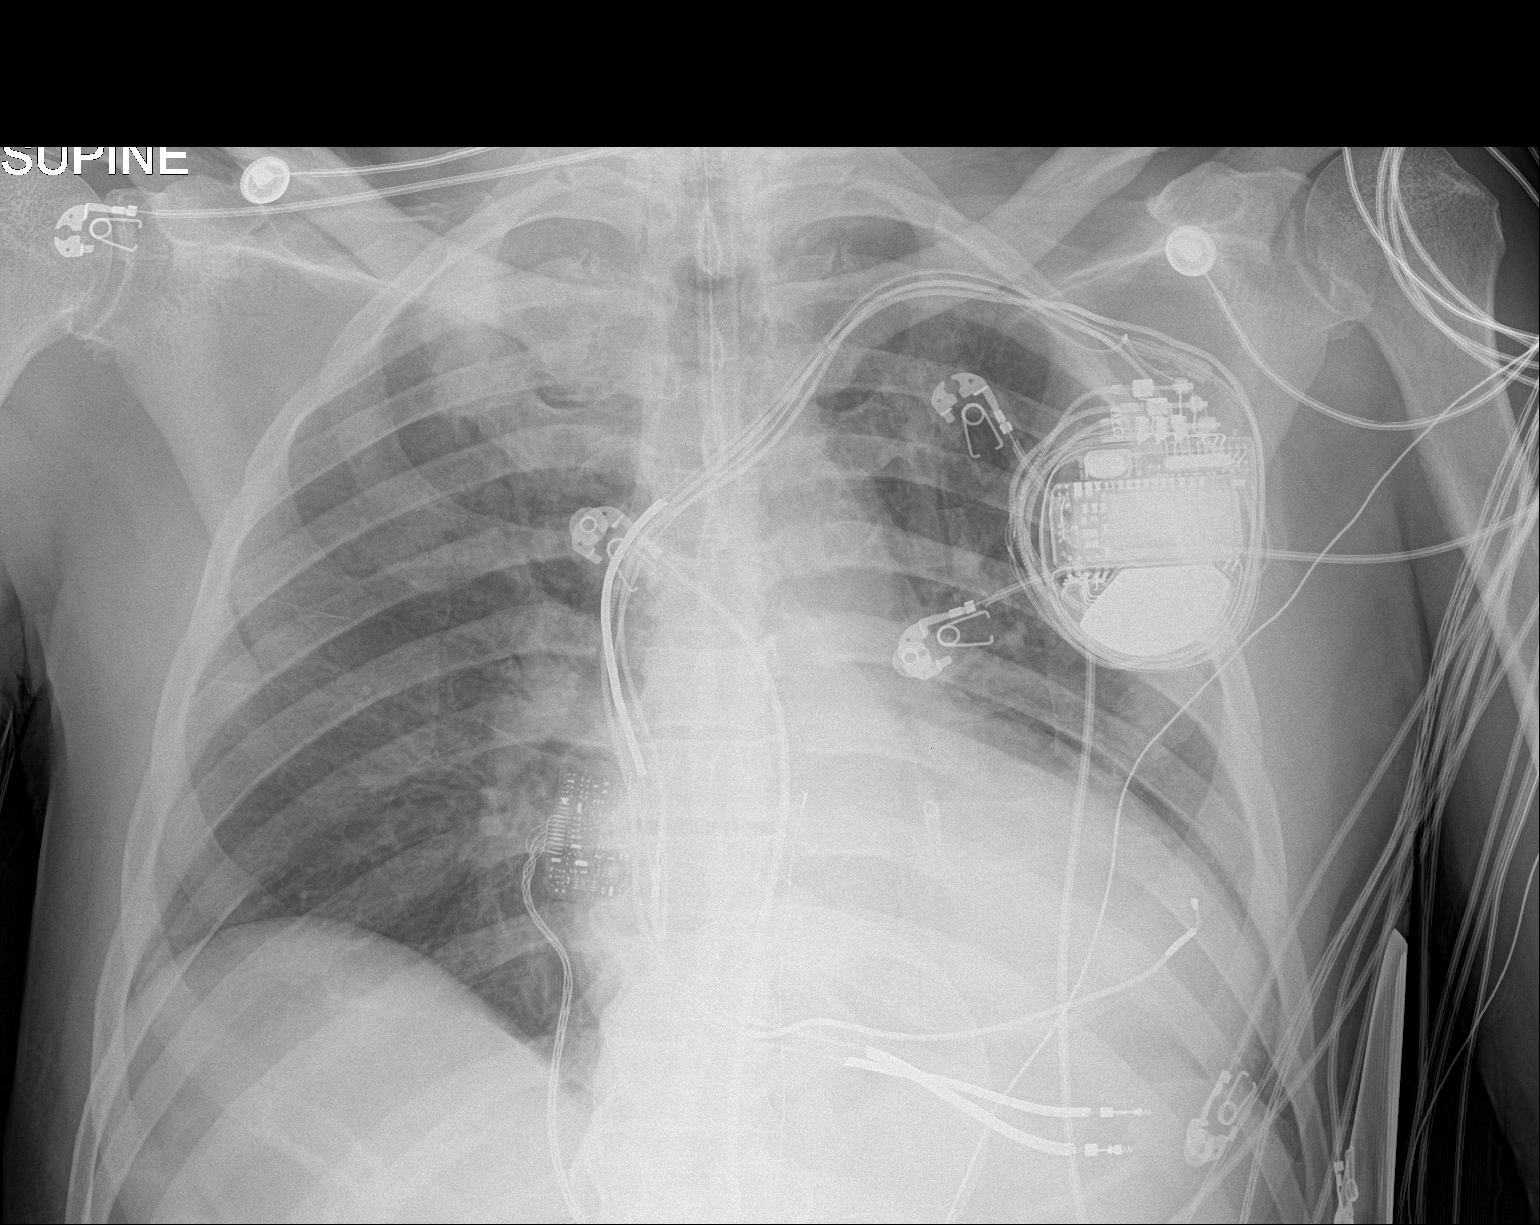

[1 of 1 positions shown; findings below may reference images not displayed]

FINDINGS: Endotracheal tube tip is halfway between the expected location of
the carina and the clavicular heads.

Cardiomegaly with biventricular ICD/ pacer and coronary stent.
Negative aortic and hilar contours. Left basilar density is likely
from the cardiomegaly. There is no edema, consolidation, effusion,
or pneumothorax. Asymmetric density at the right apex is likely
osseous. Anticipate follow-up radiographs. No visible rib fracture.
IMPRESSION: 1. Unremarkable positioning of the endotracheal tube.
2. Cardiomegaly without failure.  Biventricular ICD/pacer.
3. Asymmetric density at the right apex is favored osseous. After
convalescence recommend two-view study.

## 2017-08-29 IMAGING — CR DG ABD PORTABLE 1V
1 series · 1 of 1 positions shown · non-contrast
Comparison: Abdominal radiograph 01/07/2016

CLINICAL DATA: Patient status post OG tube placement.

EXAM:
PORTABLE ABDOMEN - 1 VIEW

[AP]
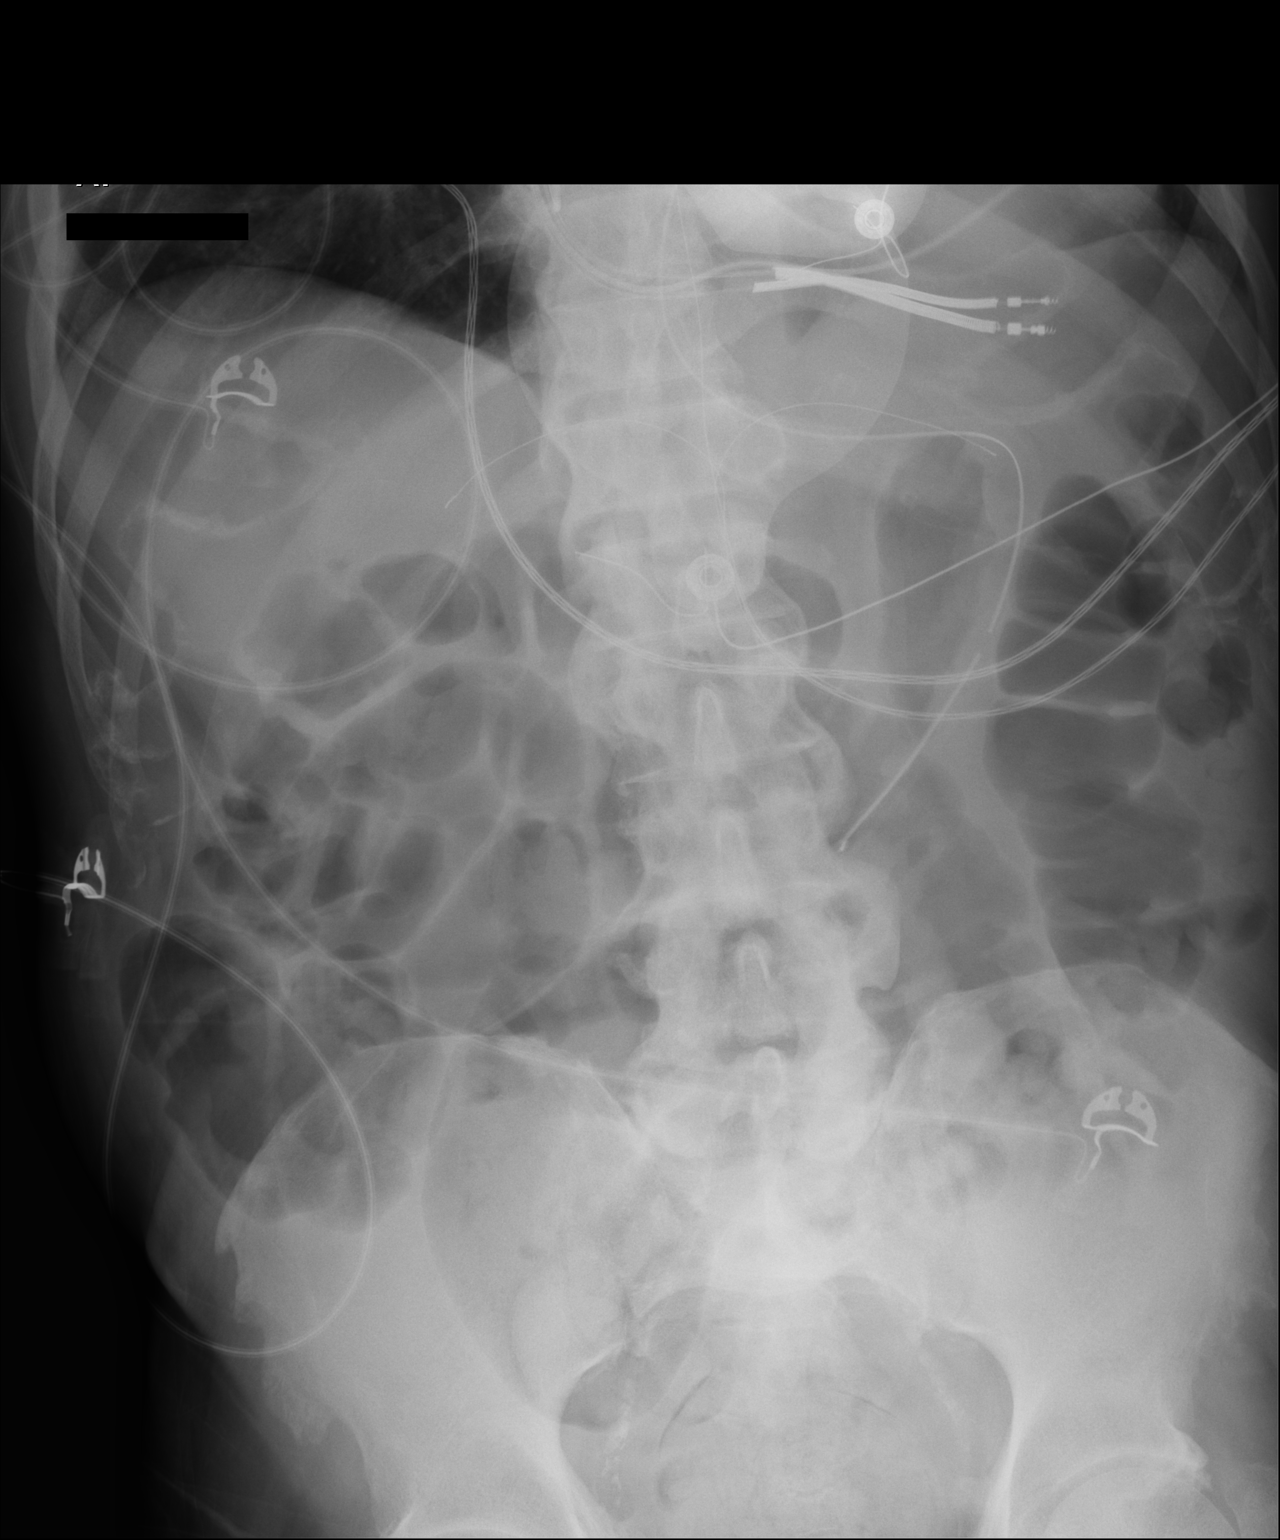

[1 of 1 positions shown; findings below may reference images not displayed]

FINDINGS: Enteric tube tip and side-port project over the stomach.
Nonobstructed bowel gas pattern. Lumbar spine degenerative changes.
IMPRESSION: Enteric tube tip and side-port project over the stomach.

## 2017-08-30 IMAGING — DX DG CHEST 1V PORT
1 series · 1 of 1 positions shown · non-contrast
Comparison: Prior chest x-ray 01/07/2016

CLINICAL DATA: 85-year-old male with acute respiratory failure

EXAM:
PORTABLE CHEST 1 VIEW

[chest ap]
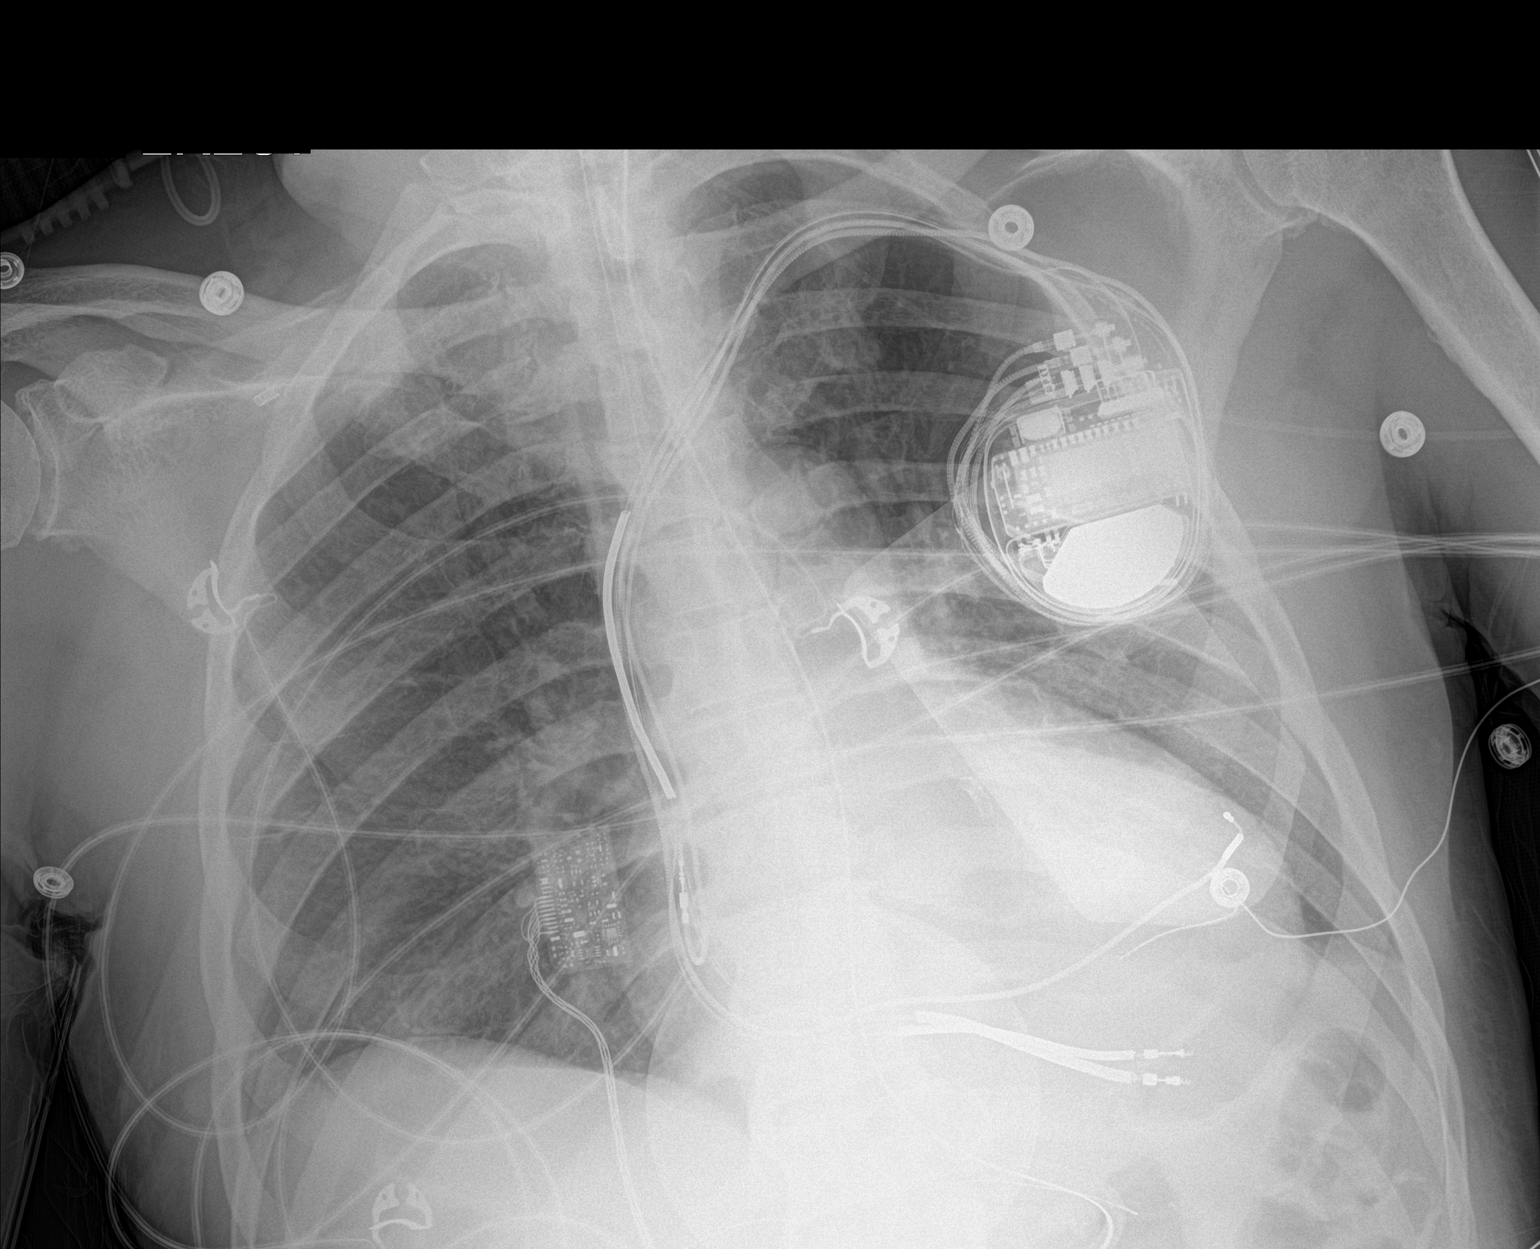

[1 of 1 positions shown; findings below may reference images not displayed]

FINDINGS: The endotracheal tube is 2.4 cm above the carina. A nasogastric tube
is present. The tip is coiled in the gastric fundus. Left subclavian
approach biventricular cardiac rhythm maintenance device. Leads
project over the right atrium, right ventricle and overlying the
left ventricle. External defibrillator pads project over the chest.
Stable cardiomegaly with left heart enlargement. Metallic stent
projects over the coronary artery. No pneumothorax, pleural effusion
or pulmonary edema. Persistently low inspiratory volumes.
IMPRESSION: 1. Interval placement of a nasogastric tube which is coiled in the
gastric fundus.
2. Otherwise, stable and satisfactory support apparatus.
3. Persistent low inspiratory volumes with perhaps mild bibasilar
atelectasis.
4. Cardiomegaly without evidence of failure.

## 2017-08-31 IMAGING — CR DG CHEST 1V PORT
1 series · 1 of 1 positions shown · non-contrast
Comparison: Chest x-ray 01/08/2016.

CLINICAL DATA: 85-year-old male with history of acute respiratory
failure.

EXAM:
PORTABLE CHEST 1 VIEW

[AP]
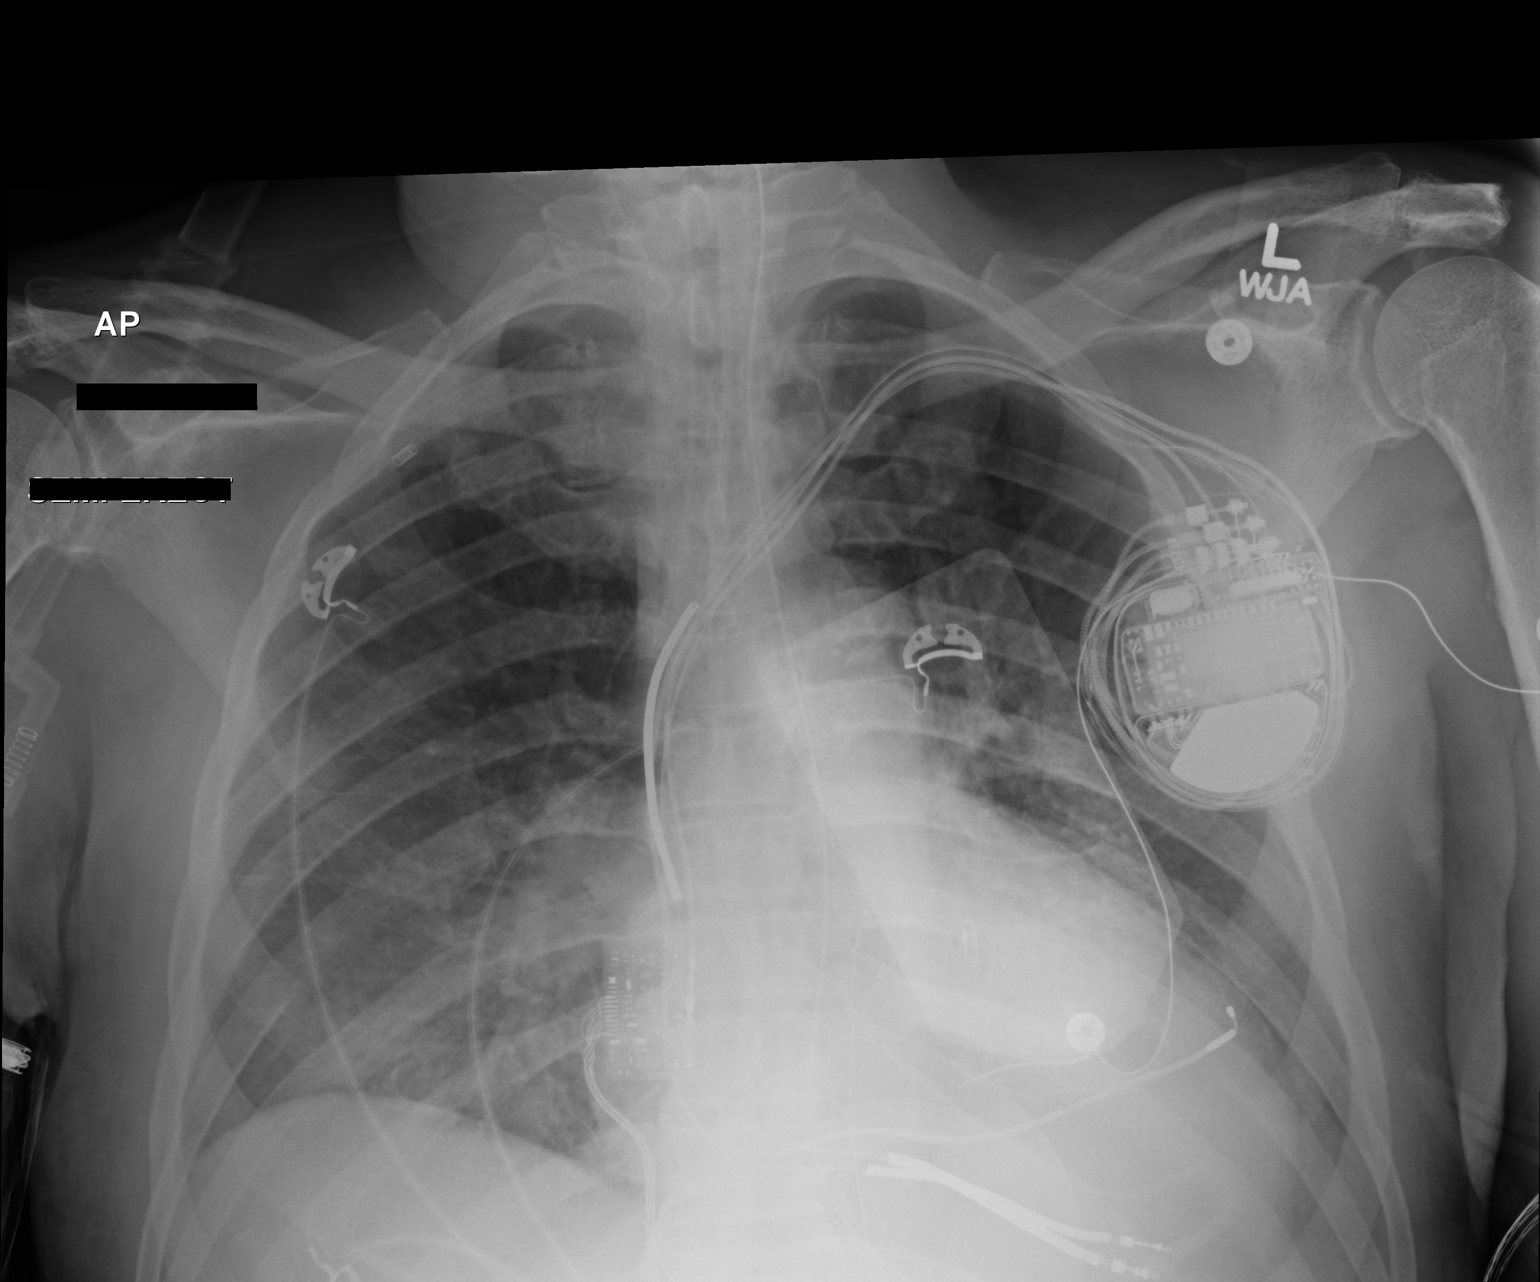

[1 of 1 positions shown; findings below may reference images not displayed]

FINDINGS: An endotracheal tube is in place with tip 3.7 cm above the carina. A
nasogastric tube is seen extending into the stomach, however, the
tip of the nasogastric tube extends below the lower margin of the
image. Left-sided biventricular pacemaker/AICD with lead tips
projecting over the expected location of the right atrium, right
ventricular apex and overlying the left ventricle via the coronary
sinus and coronary veins. Lung volumes are normal. Patchy opacities
are noted throughout the mid to lower lungs bilaterally, which are
new compared to the prior study. Probable small left pleural
effusion. Mild cardiomegaly. Upper mediastinal contours are within
normal limits. Atherosclerosis in the thoracic aorta.
IMPRESSION: 1. Support apparatus, as above.
2. Interval development of patchy ill-defined opacities throughout
the mid to lower lungs bilaterally, concerning for sequela of
aspiration or developing infection. Some component of underlying
atelectasis is also likely present.
3. Probable small left pleural effusion.
4. Mild cardiomegaly.

## 2017-09-04 ENCOUNTER — Ambulatory Visit (INDEPENDENT_AMBULATORY_CARE_PROVIDER_SITE_OTHER): Payer: Medicare HMO

## 2017-09-04 DIAGNOSIS — Z95810 Presence of automatic (implantable) cardiac defibrillator: Secondary | ICD-10-CM | POA: Diagnosis not present

## 2017-09-04 DIAGNOSIS — I5022 Chronic systolic (congestive) heart failure: Secondary | ICD-10-CM | POA: Diagnosis not present

## 2017-09-07 NOTE — Progress Notes (Signed)
EPIC Encounter for ICM Monitoring  Patient Name: STEPFON [name] is a 83 y.o. male Date: 09/07/2017 Primary Care Physican: Axel Filler, MD Primary Cardiologist:Taylor Electrophysiologist: Druscilla Brownie Weight:unknown Bi-V Pacing: 93.6%      Transmission reviewed   Thoracic impedance normal  Prescribed dosage: Furosemide 40 mg 1 tablet twice a day.   Labs: 06/01/2017 Creatinine 2.10, BUN 34, Potassium 4.7, Sodium 139, EGFR 27-32 04/25/2017 Creatinine1.92, BUN56, Potassium4.4, Sodium131, EGFR30-35 04/24/2017 Creatinine2.10, BUN56, Potassium4.3, Sodium131, [id number]  04/23/2017 Creatinine2.59, BUN64, Potassium4.4, Sodium133, [id number]  04/22/2017 Creatinine2.54, BUN51, Potassium3.8, GOTLXB262, [id number]  04/21/2017 Creatinine2.75, BUN51, Potassium4.0, Sodium130, [id number] 12/11/2016 Creatinine 1.68, BUN 33, Potassium 4.3, Sodium 138, EGFR 37-43 04/17/2016 Creatinine 2.08, BUN 37, Potassium 4.1, Sodium 135, EGFR 28-32  Recommendations: No changes.  Follow-up plan: ICM clinic phone appointment on 10/05/2017.   Copy of ICM check sent to Dr. Lovena Le.   3 month ICM trend: 09/04/2017    1 Year ICM trend:       [name], RN 09/07/2017 5:37 PM

## 2017-09-13 ENCOUNTER — Encounter (HOSPITAL_COMMUNITY): Payer: Self-pay | Admitting: Emergency Medicine

## 2017-09-13 ENCOUNTER — Emergency Department (HOSPITAL_COMMUNITY)
Admission: EM | Admit: 2017-09-13 | Discharge: 2017-09-13 | Disposition: A | Discharge status: Home / Self Care | Payer: Medicare HMO | Attending: Emergency Medicine | Admitting: Emergency Medicine

## 2017-09-13 DIAGNOSIS — N183 Chronic kidney disease, stage 3 (moderate): Secondary | ICD-10-CM | POA: Insufficient documentation

## 2017-09-13 DIAGNOSIS — Z95810 Presence of automatic (implantable) cardiac defibrillator: Secondary | ICD-10-CM | POA: Insufficient documentation

## 2017-09-13 DIAGNOSIS — E785 Hyperlipidemia, unspecified: Secondary | ICD-10-CM | POA: Insufficient documentation

## 2017-09-13 DIAGNOSIS — I252 Old myocardial infarction: Secondary | ICD-10-CM | POA: Diagnosis not present

## 2017-09-13 DIAGNOSIS — Z794 Long term (current) use of insulin: Secondary | ICD-10-CM | POA: Diagnosis not present

## 2017-09-13 DIAGNOSIS — K5900 Constipation, unspecified: Secondary | ICD-10-CM | POA: Diagnosis not present

## 2017-09-13 DIAGNOSIS — Z7982 Long term (current) use of aspirin: Secondary | ICD-10-CM | POA: Diagnosis not present

## 2017-09-13 DIAGNOSIS — I5022 Chronic systolic (congestive) heart failure: Secondary | ICD-10-CM | POA: Diagnosis not present

## 2017-09-13 DIAGNOSIS — E119 Type 2 diabetes mellitus without complications: Secondary | ICD-10-CM | POA: Insufficient documentation

## 2017-09-13 DIAGNOSIS — Z79899 Other long term (current) drug therapy: Secondary | ICD-10-CM | POA: Insufficient documentation

## 2017-09-13 DIAGNOSIS — I130 Hypertensive heart and chronic kidney disease with heart failure and stage 1 through stage 4 chronic kidney disease, or unspecified chronic kidney disease: Secondary | ICD-10-CM | POA: Diagnosis not present

## 2017-09-13 MED ORDER — POLYETHYLENE GLYCOL 3350 17 G PO PACK: 17.0000 g | PACK | Freq: Every day | ORAL | 0 refills | Status: DC

## 2017-09-13 NOTE — ED Notes (Signed)
Pt verbalized understanding of discharge instructions and denies any further questions at this time.   

## 2017-09-13 NOTE — ED Triage Notes (Signed)
Pt states today he was unable to have a BM and has a lot of pressure in pain in his bottom but unable to pass any stool. Pt states he did have a normal BM yesterday. Denies any pain in abd.

## 2017-09-13 NOTE — ED Provider Notes (Signed)
[facility] EMERGENCY DEPARTMENT Provider Note   CSN: [id number] Arrival date & time: 09/13/17  1031     History   Chief Complaint Chief Complaint  Patient presents with  . Constipation    HPI [name] is a 83 y.o. male.  HPI 83 year old male who reports fecal impaction today.  States this is painful near his rectum.  Denies abdominal pain.  Denies nausea vomiting.  Symptoms are mild.  He did have a bowel movement yesterday without any issues.  No blood in his stool.  He has had constipation before but states he ran out of his MiraLAX.   Past Medical History:  Diagnosis Date  . Adenomatous colon polyp 02/14/2012  . BBB (bundle branch block)    right  . Carotid stenosis    a. s/p L carotid stent 2004;  b. Carotid US (09/2014): Bilateral ICA 1-39%, left ECA >59%, normal subclavian bilaterally, occluded left vertebral >> FU 2 years  . Chronic kidney disease   . Chronic systolic CHF (congestive heart failure) (HCC)    a. ischemic CM EF 15-20%;  b. s/p AICD 05/24/04;  c. Echo 7/06: EF 30-40%, mild reduced RVSF, d. Echo 12/2015 EF 35-40%  . Elevated PSA   . HTN (hypertension)   . Hyperlipidemia   . ICD (implantable cardiac defibrillator) in place 12-25-2012   MDT CRTD upgrade by Dr Lovena Le  . Myocardial infarction ([place]) 1990  . Pericardial effusion    Echocardiogram (09/2014): EF 25% with distal anterior, distal inferior, distal lateral and apical akinesis, grade 1 diastolic dysfunction, very mild aortic stenosis (mean 7 mmHg) - this may be depressed due to low EF (2-D images suggest mild to moderate aortic stenosis), large pericardial effusion, no RA collapse  . PVD (peripheral vascular disease) ([place])    s/p L carotid PTCA/stent 2004  . Transient ischemic attack   . Type II diabetes mellitus [facility])     Patient Active Problem List   Diagnosis Date Noted  . Dry eye syndrome of both eyes 06/06/2017  . Weight loss 12/11/2016  . Advanced care  planning/counseling discussion 12/11/2016  . Urinary retention 01/13/2016  . Healthcare maintenance 11/22/2015  . [name] (chronic kidney disease) stage 3, GFR 30-59 ml/min (HCC) 12/04/2014  . Automatic implantable cardioverter-defibrillator in situ 01/16/2012  . Constipation 03/10/2009  . Coronary atherosclerosis 12/25/2007  . Chronic systolic congestive heart failure ([place]) 12/25/2007  . Hyperlipidemia 01/02/2007  . Type 2 diabetes mellitus with peripheral vascular disease ([place]) 01/01/1989    Past Surgical History:  Procedure Laterality Date  . BI-VENTRICULAR IMPLANTABLE CARDIOVERTER DEFIBRILLATOR UPGRADE N/A 12/25/2012   Procedure: BI-VENTRICULAR IMPLANTABLE CARDIOVERTER DEFIBRILLATOR UPGRADE;  Surgeon: Evans Lance, MD;  Location: [facility] CATH LAB;  Service: Cardiovascular;  Laterality: N/A;  . BIV ICD UPGRADE  12/25/2012   MDT CRTD upgrade by Dr Lovena Le for ischemic cardiomyopathy and worsening conduction system disease  . CARDIAC CATHETERIZATION  06/2003,  01/2004  . CARDIAC CATHETERIZATION N/A 01/13/2016   Procedure: Left Heart Cath and Coronary Angiography;  Surgeon: Jettie Booze, MD;  Location: [place] CV LAB;  Service: Cardiovascular;  Laterality: N/A;  . CARDIAC DEFIBRILLATOR PLACEMENT  05/24/2004   Implantation of a MDT single-chamber defibrillator  . CAROTID STENT  09/11/2003   Percutaneous transluminal angioplasty and stent placement of the left internal carotid artery.  Marland Kitchen CATARACT EXTRACTION W/ INTRAOCULAR LENS  IMPLANT, BILATERAL Bilateral ~ 2010  . CORONARY ANGIOPLASTY WITH STENT PLACEMENT  1990   "2" (12/25/2012)  .  INSERT / REPLACE / REMOVE PACEMAKER    . LEAD REVISION N/A 12/25/2012   Procedure: LEAD REVISION;  Surgeon: Evans Lance, MD;  Location: [facility] CATH LAB;  Service: Cardiovascular;  Laterality: N/A;       Home Medications    Prior to Admission medications   Medication Sig Start Date End Date Taking? Authorizing Provider  aspirin EC 81 MG tablet Take 81 mg by  mouth daily.    [provider]  atorvastatin (LIPITOR) 40 MG tablet TAKE 1 TABLET(40 MG) BY MOUTH DAILY AT 6 PM 04/14/16   Axel Filler, MD  B-D ULTRAFINE III SHORT PEN 31G X 8 MM MISC USE AS DIRECTED ONCE DAILY FOR INSULIN INJECTION 05/08/17   Axel Filler, MD  Blood Glucose Monitoring Suppl (ACCU-CHEK AVIVA PLUS) w/Device KIT Check finger stick glucose once daily 05/11/17   Axel Filler, MD  carvedilol (COREG) 12.5 MG tablet TAKE 1 TABLET BY MOUTH TWICE DAILY 08/08/17   Evans Lance, MD  fluorometholone (FML) 0.1 % ophthalmic suspension Place 2 drops into both eyes daily.  06/23/16   [provider]  furosemide (LASIX) 40 MG tablet TAKE 1 TABLET BY MOUTH TWICE DAILY 07/09/17   Evans Lance, MD  glucose blood (ACCU-CHEK AVIVA PLUS) test strip Check blood sugar up to 3 times a day 08/06/17   Axel Filler, MD  insulin glargine (LANTUS) 100 UNIT/ML injection Inject 0.25 mLs (25 Units total) at bedtime into the skin. 08/06/17   Axel Filler, MD  lactose free nutrition (BOOST) LIQD Take 237 mLs by mouth daily.    [provider]  pantoprazole (PROTONIX) 40 MG tablet TAKE 1 TABLET(40 MG) BY MOUTH DAILY 08/27/17   Axel Filler, MD  polyethylene glycol [facility] Floria Raveling) packet Take 17 g by mouth daily. 09/13/17   Jola Schmidt, MD  senna-docusate (SENOKOT-S) 8.6-50 MG tablet Take 2 tablets by mouth daily. 05/04/16   Konrad Felix, PA  tamsulosin (FLOMAX) 0.4 MG CAPS capsule Take 0.4 mg by mouth daily after supper.    [provider]  VENTOLIN HFA 108 (90 Base) MCG/ACT inhaler INHALE 2 PUFFS INTO THE LUNGS EVERY 6 HOURS AS NEEDED FOR WHEEZING OR SHORTNESS OF BREATH 03/15/16   Axel Filler, MD    Family History Family History  Problem Relation Age of Onset  . Diabetes Mother   . Diabetes Brother   . Heart attack Neg Hx   . Stroke Neg Hx     Social History Social History   Tobacco Use    . Smoking status: Former Smoker    Types: Cigarettes    Last attempt to quit: 12/27/1967    Years since quitting: 49.7  . Smokeless tobacco: Never Used  Substance Use Topics  . Alcohol use: No    Alcohol/week: 0.0 oz    Comment: 12/25/2012 "quit all alcohol 60 yr ago"  . Drug use: No     Allergies   Patient has no known allergies.   Review of Systems Review of Systems  All other systems reviewed and are negative.    Physical Exam Updated Vital Signs BP 130/70   Pulse 62   Temp 98.1 F (36.7 C)   Resp 17   SpO2 100%   Physical Exam  Constitutional: He is oriented to person, place, and time. He appears well-developed and well-nourished.  HENT:  Head: Normocephalic.  Eyes: EOM are normal.  Neck: Normal range of motion.  Cardiovascular: Normal rate.  Pulmonary/Chest: Effort normal.  Abdominal: Soft. He exhibits no distension.  Genitourinary:  Genitourinary Comments: Fecal impaction encountered but too high up to disimpact  Musculoskeletal: Normal range of motion.  Neurological: He is alert and oriented to person, place, and time.  Psychiatric: He has a normal mood and affect.  Nursing note and vitals reviewed.    ED Treatments / Results  Labs (all labs ordered are listed, but only abnormal results are displayed) Labs Reviewed - No data to display  EKG  EKG Interpretation None       Radiology No results found.  Procedures Procedures (including critical care time)  Medications Ordered in ED Medications - No data to display   Initial Impression / Assessment and Plan / ED Course  I have reviewed the triage vital signs and the nursing notes.  Pertinent labs & imaging results that were available during my care of the patient were reviewed by me and considered in my medical decision making (see chart for details).     Fecal disimpaction.  Home with instructions to take enemas and MiraLAX.  Primary care follow-up.  Patient understands return to the ER  for new or worsening symptoms  Final Clinical Impressions(s) / ED Diagnoses   Final diagnoses:  Constipation, unspecified constipation type    ED Discharge Orders        Ordered    polyethylene glycol (MIRALAX / GLYCOLAX) packet  Daily     09/13/17 1258       Jola Schmidt, MD 09/13/17 1303

## 2017-09-13 NOTE — ED Notes (Signed)
ED Provider at bedside. 

## 2017-10-05 ENCOUNTER — Ambulatory Visit (INDEPENDENT_AMBULATORY_CARE_PROVIDER_SITE_OTHER): Payer: Medicare HMO

## 2017-10-05 DIAGNOSIS — I5022 Chronic systolic (congestive) heart failure: Secondary | ICD-10-CM | POA: Diagnosis not present

## 2017-10-05 DIAGNOSIS — Z95810 Presence of automatic (implantable) cardiac defibrillator: Secondary | ICD-10-CM | POA: Diagnosis not present

## 2017-10-05 NOTE — Progress Notes (Signed)
EPIC Encounter for ICM Monitoring  Patient Name: [name] is a 84 y.o. male Date: 10/05/2017 Primary Care Physican: Axel Filler, MD Primary Cardiologist:Taylor Electrophysiologist: Druscilla Brownie Weight:unknown Bi-V Pacing: 95.5%       Heart Failure questions reviewed, pt asymptomatic.   Thoracic impedance normal.  Prescribed dosage: Furosemide 40 mg 1 tablet twice a day.   Labs: 06/01/2017 Creatinine 2.10, BUN 34, Potassium 4.7, Sodium 139, EGFR 27-32 04/25/2017 Creatinine1.92, BUN56, Potassium4.4, Sodium131, EGFR30-35 04/24/2017 Creatinine2.10, BUN56, Potassium4.3, Sodium131, [id number]  04/23/2017 Creatinine2.59, BUN64, Potassium4.4, Sodium133, [id number]  04/22/2017 Creatinine2.54, BUN51, Potassium3.8, TDVVOH607, PXTG62-69  04/21/2017 Creatinine2.75, BUN51, Potassium4.0, Sodium130, [id number] 12/11/2016 Creatinine 1.68, BUN 33, Potassium 4.3, Sodium 138, EGFR 37-43 04/17/2016 Creatinine 2.08, BUN 37, Potassium 4.1, Sodium 135, EGFR 28-32  Recommendations: No changes.  Encouraged to call for fluid symptoms.  Follow-up plan: ICM clinic phone appointment on 11/06/2017.    Copy of ICM check sent to Dr. Lovena Le.   3 month ICM trend: 10/05/2017    1 Year ICM trend:       [name], RN 10/05/2017 11:16 AM

## 2017-10-09 ENCOUNTER — Other Ambulatory Visit: Payer: Self-pay | Admitting: Student in an Organized Health Care Education/Training Program

## 2017-10-09 DIAGNOSIS — E1151 Type 2 diabetes mellitus with diabetic peripheral angiopathy without gangrene: Secondary | ICD-10-CM

## 2017-11-06 ENCOUNTER — Ambulatory Visit (INDEPENDENT_AMBULATORY_CARE_PROVIDER_SITE_OTHER): Payer: Medicare HMO

## 2017-11-06 DIAGNOSIS — H35033 Hypertensive retinopathy, bilateral: Secondary | ICD-10-CM | POA: Diagnosis not present

## 2017-11-06 DIAGNOSIS — I5022 Chronic systolic (congestive) heart failure: Secondary | ICD-10-CM

## 2017-11-06 DIAGNOSIS — E113293 Type 2 diabetes mellitus with mild nonproliferative diabetic retinopathy without macular edema, bilateral: Secondary | ICD-10-CM | POA: Diagnosis not present

## 2017-11-06 DIAGNOSIS — Z95810 Presence of automatic (implantable) cardiac defibrillator: Secondary | ICD-10-CM | POA: Diagnosis not present

## 2017-11-06 DIAGNOSIS — B309 Viral conjunctivitis, unspecified: Secondary | ICD-10-CM | POA: Diagnosis not present

## 2017-11-06 DIAGNOSIS — Z961 Presence of intraocular lens: Secondary | ICD-10-CM | POA: Diagnosis not present

## 2017-11-06 NOTE — Progress Notes (Signed)
EPIC Encounter for ICM Monitoring  Patient Name: [name] is a 84 y.o. male Date: 11/06/2017 Primary Care Physican: Axel Filler, MD Primary Cardiologist:Taylor Electrophysiologist: Lovena Le Dry Weight:150 lbs Bi-V Pacing: 96.9%         Heart Failure questions reviewed, pt asymptomatic.   Thoracic impedance normal.  Prescribed dosage: Furosemide 40 mg 1 tablet twice a day.   Labs: 06/01/2017 Creatinine 2.10, BUN 34, Potassium 4.7, Sodium 139, EGFR 27-32 04/25/2017 Creatinine1.92, BUN56, Potassium4.4, Sodium131, EGFR30-35 04/24/2017 Creatinine2.10, BUN56, Potassium4.3, Sodium131, [id number]  04/23/2017 Creatinine2.59, BUN64, Potassium4.4, Sodium133, [id number]  04/22/2017 Creatinine2.54, BUN51, Potassium3.8, HYWVPX106, [id number]  04/21/2017 Creatinine2.75, BUN51, Potassium4.0, Sodium130, EVOJ50-09 12/11/2016 Creatinine 1.68, BUN 33, Potassium 4.3, Sodium 138, EGFR 37-43 04/17/2016 Creatinine 2.08, BUN 37, Potassium 4.1, Sodium 135, EGFR 28-32  Recommendations: No changes.   Encouraged to call for fluid symptoms.  Follow-up plan: ICM clinic phone appointment on 12/07/2017.    Copy of ICM check sent to Dr. Lovena Le.   3 month ICM trend: 11/05/2017    1 Year ICM trend:       [name], RN 11/06/2017 9:20 AM

## 2017-11-12 ENCOUNTER — Ambulatory Visit (INDEPENDENT_AMBULATORY_CARE_PROVIDER_SITE_OTHER): Payer: Medicare HMO | Admitting: Student in an Organized Health Care Education/Training Program

## 2017-11-12 ENCOUNTER — Encounter: Payer: Self-pay | Admitting: Student in an Organized Health Care Education/Training Program

## 2017-11-12 VITALS — BP 117/63 | HR 73 | Temp 98.2°F | Wt 131.6 lb

## 2017-11-12 DIAGNOSIS — R339 Retention of urine, unspecified: Secondary | ICD-10-CM | POA: Diagnosis not present

## 2017-11-12 DIAGNOSIS — R634 Abnormal weight loss: Secondary | ICD-10-CM | POA: Diagnosis not present

## 2017-11-12 DIAGNOSIS — N400 Enlarged prostate without lower urinary tract symptoms: Secondary | ICD-10-CM

## 2017-11-12 DIAGNOSIS — Z79899 Other long term (current) drug therapy: Secondary | ICD-10-CM

## 2017-11-12 DIAGNOSIS — Z794 Long term (current) use of insulin: Secondary | ICD-10-CM

## 2017-11-12 DIAGNOSIS — N183 Chronic kidney disease, stage 3 unspecified: Secondary | ICD-10-CM

## 2017-11-12 DIAGNOSIS — H9193 Unspecified hearing loss, bilateral: Secondary | ICD-10-CM | POA: Diagnosis not present

## 2017-11-12 DIAGNOSIS — E1151 Type 2 diabetes mellitus with diabetic peripheral angiopathy without gangrene: Secondary | ICD-10-CM

## 2017-11-12 DIAGNOSIS — E1122 Type 2 diabetes mellitus with diabetic chronic kidney disease: Secondary | ICD-10-CM

## 2017-11-12 DIAGNOSIS — H900 Conductive hearing loss, bilateral: Secondary | ICD-10-CM

## 2017-11-12 DIAGNOSIS — K5900 Constipation, unspecified: Secondary | ICD-10-CM

## 2017-11-12 DIAGNOSIS — Z681 Body mass index (BMI) 19 or less, adult: Secondary | ICD-10-CM | POA: Diagnosis not present

## 2017-11-12 DIAGNOSIS — H9311 Tinnitus, right ear: Secondary | ICD-10-CM

## 2017-11-12 DIAGNOSIS — Z87448 Personal history of other diseases of urinary system: Secondary | ICD-10-CM

## 2017-11-12 DIAGNOSIS — H9190 Unspecified hearing loss, unspecified ear: Secondary | ICD-10-CM | POA: Insufficient documentation

## 2017-11-12 LAB — GLUCOSE, CAPILLARY: Glucose-Capillary: 327 mg/dL — ABNORMAL HIGH (ref 65–99)

## 2017-11-12 LAB — POCT GLYCOSYLATED HEMOGLOBIN (HGB A1C): Hemoglobin A1C: 9.7

## 2017-11-12 MED ORDER — INSULIN GLARGINE 100 UNIT/ML ~~LOC~~ SOLN: 30.0000 [IU] | Freq: Every day | SUBCUTANEOUS | 5 refills | Status: DC

## 2017-11-12 NOTE — Assessment & Plan Note (Addendum)
Weight is stable at 131 pounds, up from 128 pounds in November.  BMI stable at about 19.5.  Still appears very thin and frail. Plan to check TSH today, especially given constipation. His nutrition is improving.  We are working on diabetes control. Malignancy workup otherwise has been reassuring so far.

## 2017-11-12 NOTE — Assessment & Plan Note (Signed)
Continues to have difficult to control diabetes with an A1c over 9%.  No home glucose logs because he did not know how to work his home meter, we went over it with him today now he feels confident he will be able to take home readings.  His poor renal function limits our therapeutic options.  Plan is to increase long-acting insulin from 25 units daily up to 30 units once daily.  I asked him to check his fingerstick glucose several times a week and bring into next visit in 3 months.  We may need to add some amount of mealtime coverage at that time.  Goal hemoglobin A1c is 8% given his advanced age and overall fragility.

## 2017-11-12 NOTE — Patient Instructions (Signed)
1. Increase Insulin to 30 units once a day ( you may take this at night if easier for you).   2. I have referred you to the ENT clinic for your right ear ringing.

## 2017-11-12 NOTE — Progress Notes (Signed)
   Assessment and Plan:  See Encounters tab for problem-based medical decision making.   __________________________________________________________  HPI:   84 year old man here for follow-up of diabetes.  Patient lives by himself, though he has a girlfriend who gives him support.  He reports good compliance with his medications, says he injects himself with insulin 25 units every day.  He says he draws it up himself.  Has not been checking home glucose because he thinks his meter is broken.  We were able to show him how to operate the meter today in clinic.  1 visits to emergency departments for constipation.  Says that this is been much improved since using MiraLAX every day.  Says his appetite is good, eats 3 meals a day.  Denies any fevers or chills.  No chest pain or pressure.  No dyspnea on exertion.  Reports good compliance with his other medications.  He does report an annoying humming in his right ear, says that his hearing overall is decreased but he still gets by.  No pain or discharge from the ear.  Denies headaches.   __________________________________________________________  Problem List: Patient Active Problem List   Diagnosis Date Noted  . Weight loss 12/11/2016    Priority: High  . Urinary retention 01/13/2016    Priority: High  . [name] (chronic kidney disease) stage 3, GFR 30-59 ml/min (HCC) 12/04/2014    Priority: High  . Chronic systolic congestive heart failure ([place]) 12/25/2007    Priority: High  . Type 2 diabetes mellitus with peripheral vascular disease ([place]) 01/01/1989    Priority: High  . Constipation 03/10/2009    Priority: Medium  . Coronary atherosclerosis 12/25/2007    Priority: Medium  . Dry eye syndrome of both eyes 06/06/2017    Priority: Low  . Advanced care planning/counseling discussion 12/11/2016    Priority: Low  . Healthcare maintenance 11/22/2015    Priority: Low  . Automatic implantable cardioverter-defibrillator in situ 01/16/2012   Priority: Low  . Hyperlipidemia 01/02/2007    Priority: Low  . Hearing loss 11/12/2017    Medications: Reconciled today in Epic __________________________________________________________  Physical Exam:  Vital Signs: Vitals:   11/12/17 0826  BP: 117/63  Pulse: 73  Temp: 98.2 F (36.8 C)  TempSrc: Oral  SpO2: 100%  Weight: 131 lb 9.6 oz (59.7 kg)    Gen: Frail, chronically ill appearing, NAD Neck: No cervical LAD, No thyromegaly or nodules, No JVD. CV: RRR, no murmurs Pulm: Normal effort, CTA throughout, no wheezing Ext: Warm, no edema, normal joints

## 2017-11-12 NOTE — Assessment & Plan Note (Signed)
Patient with BPH and history of bladder outlet obstruction.  Lower urinary tract symptoms are currently stable.  Doing well on tamsulosin, I agree with continuing at 0.8 mg daily.  He last saw a urologist in May 2017.  I will repeat a PSA today, expecting it to be somewhat elevated around 8, if it is dramatically higher than that we will send him back to urology for consideration of biopsy.

## 2017-11-12 NOTE — Assessment & Plan Note (Signed)
Plan to recheck BMP today.

## 2017-11-12 NOTE — Assessment & Plan Note (Signed)
History and exam are consistent with bilateral conductive hearing loss.  He reports a very bothersome tinnitus in his right ear.  No symptoms of vertigo.  Plan is to refer him to ENT to consider hearing aids.  I do not think there is anything we can do for the tinnitus other than reassurance.

## 2017-11-13 LAB — BMP8+ANION GAP
Anion Gap: 14 mmol/L (ref 10.0–18.0)
BUN / CREAT RATIO: 23 (ref 10–24)
BUN: 50 mg/dL — ABNORMAL HIGH (ref 8–27)
CHLORIDE: 96 mmol/L (ref 96–106)
CO2: 27 mmol/L (ref 20–29)
Calcium: 10.5 mg/dL — ABNORMAL HIGH (ref 8.6–10.2)
Creatinine, Ser: 2.13 mg/dL — ABNORMAL HIGH (ref 0.76–1.27)
GFR calc Af Amer: 32 mL/min/{1.73_m2} — ABNORMAL LOW (ref >59)
GFR calc non Af Amer: 28 mL/min/{1.73_m2} — ABNORMAL LOW (ref >59)
GLUCOSE: 326 mg/dL — AB (ref 65–99)
POTASSIUM: 4.8 mmol/L (ref 3.5–5.2)
SODIUM: 137 mmol/L (ref 134–144)

## 2017-11-13 LAB — PSA: Prostate Specific Ag, Serum: 10.5 ng/mL — ABNORMAL HIGH (ref 0.0–4.0)

## 2017-11-13 LAB — TSH: TSH: 2.78 u[IU]/mL (ref 0.450–4.500)

## 2017-11-14 ENCOUNTER — Encounter: Payer: Self-pay | Admitting: Student in an Organized Health Care Education/Training Program

## 2017-11-25 ENCOUNTER — Other Ambulatory Visit: Payer: Self-pay | Admitting: Student in an Organized Health Care Education/Training Program

## 2017-12-07 ENCOUNTER — Ambulatory Visit (INDEPENDENT_AMBULATORY_CARE_PROVIDER_SITE_OTHER): Payer: Medicare HMO

## 2017-12-07 ENCOUNTER — Telehealth: Payer: Self-pay

## 2017-12-07 DIAGNOSIS — Z95810 Presence of automatic (implantable) cardiac defibrillator: Secondary | ICD-10-CM

## 2017-12-07 DIAGNOSIS — I5022 Chronic systolic (congestive) heart failure: Secondary | ICD-10-CM | POA: Diagnosis not present

## 2017-12-07 NOTE — Progress Notes (Signed)
EPIC Encounter for ICM Monitoring  Patient Name: [name] is a 84 y.o. male Date: 12/07/2017 Primary Care Physican: Axel Filler, MD Primary Cardiologist:Taylor Electrophysiologist: Druscilla Brownie Weight:Previous weight 150 lbs Bi-V Pacing: 96.9%      Attempted call to patient and unable to reach.  Left detailed message regarding transmission.  Transmission reviewed.    Thoracic impedance normal.  Prescribed dosage: Furosemide 40 mg 1 tablet twice a day.   Labs: 11/12/2017 Creatinine 2.13, BUN 50, Potassium 4.8, Sodium 137, EGFR 28-32 06/01/2017 Creatinine 2.10, BUN 34, Potassium 4.7, Sodium 139, EGFR 27-32 04/25/2017 Creatinine1.92, BUN56, Potassium4.4, Sodium131, EGFR30-35 04/24/2017 Creatinine2.10, BUN56, Potassium4.3, Sodium131, PGFQ42-10  04/23/2017 Creatinine2.59, BUN64, Potassium4.4, Sodium133, [id number]  04/22/2017 Creatinine2.54, BUN51, Potassium3.8, Sodium133, [id number]  04/21/2017 Creatinine2.75, BUN51, Potassium4.0, Sodium130, [id number] 12/11/2016 Creatinine 1.68, BUN 33, Potassium 4.3, Sodium 138, EGFR 37-43 04/17/2016 Creatinine 2.08, BUN 37, Potassium 4.1, Sodium 135, EGFR 28-32  Recommendations: Left voice mail with ICM number and encouraged to call if experiencing any fluid symptoms.  Follow-up plan: ICM clinic phone appointment on 01/07/2018.    Copy of ICM check sent to Dr. Lovena Le.  3 month ICM trend: 12/07/2017    1 Year ICM trend:       [name], RN 12/07/2017 10:47 AM

## 2017-12-07 NOTE — Telephone Encounter (Signed)
Remote ICM transmission received.  Attempted call to patient and left detailed message per DPR regarding transmission and next ICM scheduled for 01/07/2018.  Advised to return call for any fluid symptoms or questions.

## 2017-12-11 DIAGNOSIS — H9311 Tinnitus, right ear: Secondary | ICD-10-CM | POA: Diagnosis not present

## 2017-12-11 DIAGNOSIS — H903 Sensorineural hearing loss, bilateral: Secondary | ICD-10-CM | POA: Diagnosis not present

## 2018-01-07 ENCOUNTER — Ambulatory Visit (INDEPENDENT_AMBULATORY_CARE_PROVIDER_SITE_OTHER): Payer: Medicare HMO

## 2018-01-07 DIAGNOSIS — Z95810 Presence of automatic (implantable) cardiac defibrillator: Secondary | ICD-10-CM | POA: Diagnosis not present

## 2018-01-07 DIAGNOSIS — I5022 Chronic systolic (congestive) heart failure: Secondary | ICD-10-CM | POA: Diagnosis not present

## 2018-01-08 NOTE — Progress Notes (Signed)
EPIC Encounter for ICM Monitoring  Patient Name: [name] is a 84 y.o. male Date: 01/08/2018 Primary Care Physican: Axel Filler, MD Primary Cardiologist:Taylor Electrophysiologist: Lovena Le Dry Weight:150 lbs Bi-V Pacing: 96.9%         Heart Failure questions reviewed, pt asymptomatic.   Thoracic impedance normal.  Prescribed dosage: Furosemide 40 mg 1 tablet twice a day.   Labs: 11/12/2017 Creatinine 2.13, BUN 50, Potassium 4.8, Sodium 137, EGFR 28-32 06/01/2017 Creatinine 2.10, BUN 34, Potassium 4.7, Sodium 139, EGFR 27-32 04/25/2017 Creatinine1.92, BUN56, Potassium4.4, Sodium131, EGFR30-35 04/24/2017 Creatinine2.10, BUN56, Potassium4.3, Sodium131, [id number]  04/23/2017 Creatinine2.59, BUN64, Potassium4.4, Sodium133, [id number]  04/22/2017 Creatinine2.54, BUN51, Potassium3.8, Sodium133, [id number]  04/21/2017 Creatinine2.75, BUN51, Potassium4.0, Sodium130, [id number] 12/11/2016 Creatinine 1.68, BUN 33, Potassium 4.3, Sodium 138, EGFR 37-43 04/17/2016 Creatinine 2.08, BUN 37, Potassium 4.1, Sodium 135, EGFR 28-32  Recommendations: No changes.  Encouraged to call for fluid symptoms.  Follow-up plan: ICM clinic phone appointment on 02/07/2018.    Copy of ICM check sent to Dr. Lovena Le.   3 month ICM trend: 01/07/2018    1 Year ICM trend:       [name], RN 01/08/2018 2:39 PM

## 2018-01-26 ENCOUNTER — Other Ambulatory Visit: Payer: Self-pay | Admitting: Student in an Organized Health Care Education/Training Program

## 2018-01-26 DIAGNOSIS — E1151 Type 2 diabetes mellitus with diabetic peripheral angiopathy without gangrene: Secondary | ICD-10-CM

## 2018-01-28 ENCOUNTER — Other Ambulatory Visit: Payer: Self-pay | Admitting: Student in an Organized Health Care Education/Training Program

## 2018-01-28 DIAGNOSIS — E1151 Type 2 diabetes mellitus with diabetic peripheral angiopathy without gangrene: Secondary | ICD-10-CM

## 2018-02-05 DIAGNOSIS — B308 Other viral conjunctivitis: Secondary | ICD-10-CM | POA: Diagnosis not present

## 2018-02-05 DIAGNOSIS — H35033 Hypertensive retinopathy, bilateral: Secondary | ICD-10-CM | POA: Insufficient documentation

## 2018-02-05 DIAGNOSIS — Z794 Long term (current) use of insulin: Secondary | ICD-10-CM | POA: Diagnosis not present

## 2018-02-05 DIAGNOSIS — Z961 Presence of intraocular lens: Secondary | ICD-10-CM | POA: Diagnosis not present

## 2018-02-05 DIAGNOSIS — E113293 Type 2 diabetes mellitus with mild nonproliferative diabetic retinopathy without macular edema, bilateral: Secondary | ICD-10-CM | POA: Insufficient documentation

## 2018-02-07 ENCOUNTER — Ambulatory Visit (INDEPENDENT_AMBULATORY_CARE_PROVIDER_SITE_OTHER): Payer: Medicare HMO

## 2018-02-07 DIAGNOSIS — I5022 Chronic systolic (congestive) heart failure: Secondary | ICD-10-CM

## 2018-02-07 DIAGNOSIS — Z95810 Presence of automatic (implantable) cardiac defibrillator: Secondary | ICD-10-CM

## 2018-02-08 ENCOUNTER — Telehealth: Payer: Self-pay

## 2018-02-08 NOTE — Progress Notes (Signed)
EPIC Encounter for ICM Monitoring  Patient Name: [name] is a 84 y.o. male Date: 02/08/2018 Primary Care Physican: Axel Filler, MD Primary Cardiologist:Taylor Electrophysiologist: Druscilla Brownie Weight:Previous weight 150 lbs Bi-V Pacing: 96.8%       Attempted call to patient and unable to reach.  Left detailed message, per DPR, regarding transmission.  Transmission reviewed.    Thoracic impedance slightly below baseline suggesting fluid accumulation.  Prescribed dosage: Furosemide 40 mg 1 tablet twice a day.   Labs: 02/18/2019Creatinine 2.13, BUN50, Potassium 4.8, Sodium 137, EGFR 28-32 06/01/2017 Creatinine 2.10, BUN 34, Potassium 4.7, Sodium 139, EGFR 27-32 04/25/2017 Creatinine1.92, BUN56, Potassium4.4, Sodium131, EGFR30-35 04/24/2017 Creatinine2.10, BUN56, Potassium4.3, Sodium131, [id number]  04/23/2017 Creatinine2.59, BUN64, Potassium4.4, Sodium133, [id number]  04/22/2017 Creatinine2.54, BUN51, Potassium3.8, Sodium133, [id number]  04/21/2017 Creatinine2.75, BUN51, Potassium4.0, Sodium130, UVQQ24-11 12/11/2016 Creatinine 1.68, BUN 33, Potassium 4.3, Sodium 138, EGFR 37-43  Recommendations: Left voice mail with ICM number and encouraged to call if experiencing any fluid symptoms.  Follow-up plan: ICM clinic phone appointment on 02/19/2018 to recheck fluid levels.    Copy of ICM check sent to Dr. Lovena Le.   3 month ICM trend: 02/07/2018    1 Year ICM trend:       [name], RN 02/08/2018 7:53 AM

## 2018-02-08 NOTE — Telephone Encounter (Signed)
Remote ICM transmission received.  Attempted call to patient and left detailed message, per DPR, regarding transmission and next ICM scheduled for 02/19/2018.  Advised to return call for any fluid symptoms or questions.

## 2018-02-19 ENCOUNTER — Ambulatory Visit (INDEPENDENT_AMBULATORY_CARE_PROVIDER_SITE_OTHER): Payer: Self-pay

## 2018-02-19 ENCOUNTER — Telehealth: Payer: Self-pay

## 2018-02-19 DIAGNOSIS — I5022 Chronic systolic (congestive) heart failure: Secondary | ICD-10-CM

## 2018-02-19 DIAGNOSIS — Z95810 Presence of automatic (implantable) cardiac defibrillator: Secondary | ICD-10-CM

## 2018-02-19 NOTE — Progress Notes (Signed)
EPIC Encounter for ICM Monitoring  Patient Name: [name] is a 84 y.o. male Date: 02/19/2018 Primary Care Physican: Axel Filler, MD Primary Cardiologist:Taylor Electrophysiologist: Lovena Le Dry Weight:Previous weight 150 lbs Bi-V Pacing: 93.7%      Attempted call to patient and unable to reach.    Transmission reviewed.    Thoracic impedance normal.  Prescribed dosage: Furosemide 40 mg 1 tablet twice a day.   Labs: 02/18/2019Creatinine 2.13, BUN50, Potassium 4.8, Sodium 137, EGFR 28-32 06/01/2017 Creatinine 2.10, BUN 34, Potassium 4.7, Sodium 139, EGFR 27-32 04/25/2017 Creatinine1.92, BUN56, Potassium4.4, Sodium131, EGFR30-35 04/24/2017 Creatinine2.10, BUN56, Potassium4.3, Sodium131, [id number]  04/23/2017 Creatinine2.59, BUN64, Potassium4.4, Sodium133, [id number]  04/22/2017 Creatinine2.54, BUN51, Potassium3.8, Sodium133, [id number]  04/21/2017 Creatinine2.75, BUN51, Potassium4.0, Sodium130, [id number] 12/11/2016 Creatinine 1.68, BUN 33, Potassium 4.3, Sodium 138, EGFR 37-43  Recommendations:  NONE - Unable to reach.  Follow-up plan: ICM clinic phone appointment on 03/21/2018.    Copy of ICM check sent to Dr. Lovena Le.   3 month ICM trend: 02/19/2018    1 Year ICM trend:       [name], RN 02/19/2018 12:53 PM

## 2018-02-19 NOTE — Telephone Encounter (Signed)
Remote ICM transmission received.  Attempted call to patient and no answer   

## 2018-02-22 ENCOUNTER — Other Ambulatory Visit: Payer: Self-pay | Admitting: Student in an Organized Health Care Education/Training Program

## 2018-02-25 ENCOUNTER — Ambulatory Visit: Payer: Medicare HMO | Admitting: Student in an Organized Health Care Education/Training Program

## 2018-03-15 ENCOUNTER — Encounter: Payer: Self-pay | Admitting: Physician Assistant

## 2018-03-21 ENCOUNTER — Ambulatory Visit (INDEPENDENT_AMBULATORY_CARE_PROVIDER_SITE_OTHER): Payer: Medicare HMO

## 2018-03-21 DIAGNOSIS — I5022 Chronic systolic (congestive) heart failure: Secondary | ICD-10-CM

## 2018-03-21 DIAGNOSIS — Z95810 Presence of automatic (implantable) cardiac defibrillator: Secondary | ICD-10-CM

## 2018-03-21 NOTE — Progress Notes (Signed)
EPIC Encounter for ICM Monitoring  Patient Name: [name] is a 84 y.o. male Date: 03/21/2018 Primary Care Physican: Axel Filler, MD Primary Cardiologist:Taylor Electrophysiologist: Lovena Le Dry Weight:150 lbs Bi-V Pacing: 93.7%       Heart Failure questions reviewed, pt asymptomatic.   Thoracic impedance normal.  Prescribed dosage: Furosemide 40 mg 1 tablet twice a day.   Labs: 02/18/2019Creatinine 2.13, BUN50, Potassium 4.8, Sodium 137, EGFR 28-32 06/01/2017 Creatinine 2.10, BUN 34, Potassium 4.7, Sodium 139, EGFR 27-32 04/25/2017 Creatinine1.92, BUN56, Potassium4.4, Sodium131, EGFR30-35 04/24/2017 Creatinine2.10, BUN56, Potassium4.3, Sodium131, [id number]  04/23/2017 Creatinine2.59, BUN64, Potassium4.4, Sodium133, [id number]  04/22/2017 Creatinine2.54, BUN51, Potassium3.8, Sodium133, [id number]  04/21/2017 Creatinine2.75, BUN51, Potassium4.0, Sodium130, [id number] 12/11/2016 Creatinine 1.68, BUN 33, Potassium 4.3, Sodium 138, EGFR 37-43  Recommendations: No changes. Encouraged to call for fluid symptoms.  Follow-up plan: ICM clinic phone appointment on 05/06/2018 and has a defib office appointment scheduled 04/03/2018 with Robert Bjork, PA.  Recall appt 06/04/2018 with Dr. Lovena Le.  Copy of ICM check sent to Dr. Lovena Le.   3 month ICM trend: 03/21/2018    1 Year ICM trend:       [name], RN 03/21/2018 10:21 AM

## 2018-03-22 ENCOUNTER — Encounter: Payer: Self-pay | Admitting: Physician Assistant

## 2018-04-01 ENCOUNTER — Ambulatory Visit: Payer: Medicare HMO | Admitting: Student in an Organized Health Care Education/Training Program

## 2018-04-01 ENCOUNTER — Encounter: Payer: Self-pay | Admitting: Student in an Organized Health Care Education/Training Program

## 2018-04-03 ENCOUNTER — Ambulatory Visit: Payer: Medicare HMO | Admitting: Physician Assistant

## 2018-04-12 DIAGNOSIS — B309 Viral conjunctivitis, unspecified: Secondary | ICD-10-CM | POA: Diagnosis not present

## 2018-04-12 DIAGNOSIS — Z961 Presence of intraocular lens: Secondary | ICD-10-CM | POA: Diagnosis not present

## 2018-04-12 DIAGNOSIS — E113293 Type 2 diabetes mellitus with mild nonproliferative diabetic retinopathy without macular edema, bilateral: Secondary | ICD-10-CM | POA: Diagnosis not present

## 2018-04-12 DIAGNOSIS — H35033 Hypertensive retinopathy, bilateral: Secondary | ICD-10-CM | POA: Diagnosis not present

## 2018-04-22 DIAGNOSIS — B309 Viral conjunctivitis, unspecified: Secondary | ICD-10-CM | POA: Diagnosis not present

## 2018-05-02 ENCOUNTER — Other Ambulatory Visit: Payer: Self-pay | Admitting: Internal Medicine

## 2018-05-06 ENCOUNTER — Encounter (INDEPENDENT_AMBULATORY_CARE_PROVIDER_SITE_OTHER): Payer: Self-pay

## 2018-05-06 ENCOUNTER — Encounter: Payer: Self-pay | Admitting: Student in an Organized Health Care Education/Training Program

## 2018-05-06 ENCOUNTER — Ambulatory Visit (INDEPENDENT_AMBULATORY_CARE_PROVIDER_SITE_OTHER): Payer: Medicare HMO | Admitting: Student in an Organized Health Care Education/Training Program

## 2018-05-06 VITALS — BP 140/76 | HR 80 | Temp 98.1°F | Wt 136.2 lb

## 2018-05-06 DIAGNOSIS — Z7982 Long term (current) use of aspirin: Secondary | ICD-10-CM | POA: Diagnosis not present

## 2018-05-06 DIAGNOSIS — E1151 Type 2 diabetes mellitus with diabetic peripheral angiopathy without gangrene: Secondary | ICD-10-CM

## 2018-05-06 DIAGNOSIS — E1122 Type 2 diabetes mellitus with diabetic chronic kidney disease: Secondary | ICD-10-CM | POA: Diagnosis not present

## 2018-05-06 DIAGNOSIS — Z79899 Other long term (current) drug therapy: Secondary | ICD-10-CM | POA: Diagnosis not present

## 2018-05-06 DIAGNOSIS — Z8674 Personal history of sudden cardiac arrest: Secondary | ICD-10-CM | POA: Diagnosis not present

## 2018-05-06 DIAGNOSIS — I5022 Chronic systolic (congestive) heart failure: Secondary | ICD-10-CM | POA: Diagnosis not present

## 2018-05-06 DIAGNOSIS — N183 Chronic kidney disease, stage 3 unspecified: Secondary | ICD-10-CM

## 2018-05-06 DIAGNOSIS — R634 Abnormal weight loss: Secondary | ICD-10-CM

## 2018-05-06 DIAGNOSIS — D638 Anemia in other chronic diseases classified elsewhere: Secondary | ICD-10-CM | POA: Diagnosis not present

## 2018-05-06 DIAGNOSIS — Z794 Long term (current) use of insulin: Secondary | ICD-10-CM | POA: Diagnosis not present

## 2018-05-06 LAB — POCT GLYCOSYLATED HEMOGLOBIN (HGB A1C): Hemoglobin A1C: 8.5 % — AB (ref 4.0–5.6)

## 2018-05-06 LAB — GLUCOSE, CAPILLARY: Glucose-Capillary: 201 mg/dL — ABNORMAL HIGH (ref 70–99)

## 2018-05-06 MED ORDER — INSULIN GLARGINE 100 UNIT/ML ~~LOC~~ SOLN: 35.0000 [IU] | Freq: Every day | SUBCUTANEOUS | 5 refills | Status: DC

## 2018-05-06 MED ORDER — ACCU-CHEK AVIVA PLUS W/DEVICE KIT: PACK | 0 refills | Status: AC

## 2018-05-06 MED ORDER — INSULIN PEN NEEDLE 31G X 8 MM MISC: 3 refills | Status: AC

## 2018-05-06 NOTE — Progress Notes (Signed)
   Assessment and Plan:  See Encounters tab for problem-based medical decision making.   __________________________________________________________  HPI:   84 year old man here for follow-up of diabetes.  Patient reports doing well at home over the last 6 months.  Reports his functional status is improved.  He is putting on weight.  Went to a family reunion this weekend, ate a lot of food, has a lot of good support.  Walks around his building, about 3 blocks a day, without chest pain or shortness of breath.  No falls at home.  Reports good compliance with his medications including his insulin pen.  Denies any hypoglycemia symptoms.  No fevers or chills.  Constipation is much improved with his laxative.  __________________________________________________________  Problem List: Patient Active Problem List   Diagnosis Date Noted  . Weight loss 12/11/2016    Priority: High  . Urinary retention 01/13/2016    Priority: High  . [name] (chronic kidney disease) stage 3, GFR 30-59 ml/min ([facility]) 12/04/2014    Priority: High  . Chronic systolic congestive heart failure ([place]) 12/25/2007    Priority: High  . Type 2 diabetes mellitus with peripheral vascular disease ([place]) 01/01/1989    Priority: High  . Constipation 03/10/2009    Priority: Medium  . Coronary atherosclerosis 12/25/2007    Priority: Medium  . Hypertensive retinopathy of both eyes 02/05/2018    Priority: Low  . Mild nonproliferative diabetic retinopathy of both eyes without macular edema associated with type 2 diabetes mellitus ([place]) 02/05/2018    Priority: Low  . Sensorineural hearing loss (SNHL), bilateral 12/11/2017    Priority: Low  . Dry eye syndrome of both eyes 06/06/2017    Priority: Low  . Advanced care planning/counseling discussion 12/11/2016    Priority: Low  . Healthcare maintenance 11/22/2015    Priority: Low  . Automatic implantable cardioverter-defibrillator in situ 01/16/2012    Priority: Low  . Hyperlipidemia  01/02/2007    Priority: Low    Medications: Reconciled today in Epic __________________________________________________________  Physical Exam:  Vital Signs: Vitals:   05/06/18 1140 05/06/18 1202  BP: (!) 159/77 140/76  Pulse: 81 80  Temp: 98.1 F (36.7 C)   TempSrc: Oral   SpO2: 100%   Weight: 136 lb 3.2 oz (61.8 kg)     Gen: Well appearing, NAD CV: RRR, no murmurs, distant heart sounds Pulm: Normal effort, CTA throughout, no wheezing Abd: Soft, NT, ND. Ext: Warm, no edema. Skin: Small epidermoid cyst on his sacrum causing him pain, but no drainage or erythema.

## 2018-05-06 NOTE — Assessment & Plan Note (Signed)
Hemoglobin A1c improved to 8.5% from 9.7% 6 months ago.  Plan to increase Lantus from 30 units up to 35 units once daily.  Overall he is doing much better.  Goal A1c around 8%.

## 2018-05-06 NOTE — Progress Notes (Signed)
No ICM remote transmission received for 05/06/2018 and next ICM transmission scheduled for 05/13/2018.

## 2018-05-06 NOTE — Assessment & Plan Note (Addendum)
Patient with chronic systolic heart failure with ejection fraction around 30-35% and history of V. fib arrest.  Currently stable.  Appears to be euvolemic today.  No signs of edema.  Weight is up, but I think this is a good thing related to functional status and not volume.  Plan to continue with Lasix 40 mg twice daily, carvedilol 12.5 mg twice daily, aspirin and atorvastatin.  Follow-up with heart failure clinic as needed.  Continue with home monitoring.

## 2018-05-06 NOTE — Assessment & Plan Note (Signed)
Weight is improving nicely, up 5 pounds over the last 6 months.  Appetite is good.  Functional status is excellent.

## 2018-05-06 NOTE — Patient Instructions (Signed)
Use Eucerin Cream on your legs daily.

## 2018-05-06 NOTE — Assessment & Plan Note (Signed)
Plan to check BMP today.  GFR historically has been right on the borderline at 30.

## 2018-05-07 ENCOUNTER — Encounter: Payer: Self-pay | Admitting: Student in an Organized Health Care Education/Training Program

## 2018-05-07 LAB — BMP8+ANION GAP
ANION GAP: 16 mmol/L (ref 10.0–18.0)
BUN/Creatinine Ratio: 16 (ref 10–24)
BUN: 28 mg/dL — ABNORMAL HIGH (ref 8–27)
CALCIUM: 10.2 mg/dL (ref 8.6–10.2)
CO2: 23 mmol/L (ref 20–29)
CREATININE: 1.8 mg/dL — AB (ref 0.76–1.27)
Chloride: 102 mmol/L (ref 96–106)
GFR calc Af Amer: 39 mL/min/{1.73_m2} — ABNORMAL LOW (ref >59)
GFR calc non Af Amer: 34 mL/min/{1.73_m2} — ABNORMAL LOW (ref >59)
Glucose: 201 mg/dL — ABNORMAL HIGH (ref 65–99)
Potassium: 4.8 mmol/L (ref 3.5–5.2)
Sodium: 141 mmol/L (ref 134–144)

## 2018-05-07 LAB — IRON AND TIBC
Iron Saturation: 32 % (ref 15–55)
Iron: 88 ug/dL (ref 38–169)
Total Iron Binding Capacity: 278 ug/dL (ref 250–450)
UIBC: 190 ug/dL (ref 111–343)

## 2018-05-07 LAB — CBC
HEMOGLOBIN: 12.7 g/dL — AB (ref 13.0–17.7)
Hematocrit: 39.6 % (ref 37.5–51.0)
MCH: 29.8 pg (ref 26.6–33.0)
MCHC: 32.1 g/dL (ref 31.5–35.7)
MCV: 93 fL (ref 79–97)
Platelets: 223 10*3/uL (ref 150–450)
RBC: 4.26 x10E6/uL (ref 4.14–5.80)
RDW: 13.9 % (ref 12.3–15.4)
WBC: 6.2 10*3/uL (ref 3.4–10.8)

## 2018-05-07 LAB — FERRITIN: FERRITIN: 192 ng/mL (ref 30–400)

## 2018-05-13 ENCOUNTER — Ambulatory Visit (INDEPENDENT_AMBULATORY_CARE_PROVIDER_SITE_OTHER): Payer: Medicare HMO

## 2018-05-13 DIAGNOSIS — Z95810 Presence of automatic (implantable) cardiac defibrillator: Secondary | ICD-10-CM | POA: Diagnosis not present

## 2018-05-13 DIAGNOSIS — I5022 Chronic systolic (congestive) heart failure: Secondary | ICD-10-CM

## 2018-05-13 NOTE — Progress Notes (Signed)
EPIC Encounter for ICM Monitoring  Patient Name: [name] is a 84 y.o. male Date: 05/13/2018 Primary Care Physican: Axel Filler, MD Primary Cardiologist:Taylor Electrophysiologist: Lovena Le Dry Weight:145 lbs Bi-V Pacing: 96.2%       Heart Failure questions reviewed, pt asymptomatic.  Denies any fluid symptoms.   Thoracic impedance abnormal suggesting fluid accumulation starting 04/23/2018.  Prescribed dosage: Furosemide 40 mg 1 tablet twice a day.   Labs: 05/06/2018 Creatinine 1.80, BUN 28, Potassium 4.4, Sodium 141, EGFR 34-39 02/18/2019Creatinine 2.13, BUN50, Potassium 4.8, Sodium 137, EGFR 28-32 06/01/2017 Creatinine 2.10, BUN 34, Potassium 4.7, Sodium 139, EGFR 27-32 04/25/2017 Creatinine1.92, BUN56, Potassium4.4, Sodium131, EGFR30-35 04/24/2017 Creatinine2.10, BUN56, Potassium4.3, Sodium131, [id number]  04/23/2017 Creatinine2.59, BUN64, Potassium4.4, Sodium133, [id number]  04/22/2017 Creatinine2.54, BUN51, Potassium3.8, Sodium133, [id number]  04/21/2017 Creatinine2.75, BUN51, Potassium4.0, Sodium130, [id number] 12/11/2016 Creatinine 1.68, BUN 33, Potassium 4.3, Sodium 138, EGFR 37-43  Recommendations:  Advised to decrease salt intake.  Encouraged to call for fluid symptoms.  Follow-up plan: ICM clinic phone appointment on 05/20/2018 (manual send) to recheck fluid levels.   Recall appt 06/04/2018 with Dr. Lovena Le.  Copy of ICM check sent to Dr. Lovena Le.   3 month ICM trend: 05/13/2018    1 Year ICM trend:       [name], RN 05/13/2018 10:54 AM

## 2018-05-22 NOTE — Progress Notes (Signed)
No ICM remote transmission received for 05/20/2018 and next ICM transmission scheduled for 06/20/2018.

## 2018-05-30 ENCOUNTER — Ambulatory Visit (INDEPENDENT_AMBULATORY_CARE_PROVIDER_SITE_OTHER): Payer: Medicare HMO | Admitting: *Deleted

## 2018-05-30 DIAGNOSIS — I472 Ventricular tachycardia, unspecified: Secondary | ICD-10-CM

## 2018-05-30 DIAGNOSIS — I5022 Chronic systolic (congestive) heart failure: Secondary | ICD-10-CM | POA: Diagnosis not present

## 2018-05-30 NOTE — Progress Notes (Signed)
Remote ICD transmission.   

## 2018-06-10 ENCOUNTER — Encounter: Payer: Self-pay | Admitting: Student in an Organized Health Care Education/Training Program

## 2018-06-10 DIAGNOSIS — D638 Anemia in other chronic diseases classified elsewhere: Secondary | ICD-10-CM | POA: Insufficient documentation

## 2018-06-20 ENCOUNTER — Ambulatory Visit (INDEPENDENT_AMBULATORY_CARE_PROVIDER_SITE_OTHER): Payer: Medicare HMO

## 2018-06-20 DIAGNOSIS — Z95810 Presence of automatic (implantable) cardiac defibrillator: Secondary | ICD-10-CM | POA: Diagnosis not present

## 2018-06-20 DIAGNOSIS — I5022 Chronic systolic (congestive) heart failure: Secondary | ICD-10-CM | POA: Diagnosis not present

## 2018-06-20 NOTE — Progress Notes (Signed)
EPIC Encounter for ICM Monitoring  Patient Name: [name] is a 84 y.o. male Date: 06/20/2018 Primary Care Physican: Axel Filler, MD Primary Cardiologist:Taylor Electrophysiologist: Lovena Le Dry Weight:150 lbs Bi-V Pacing: 96.7%       Heart Failure questions reviewed, pt asymptomatic.   Thoracic impedance normal.  Prescribed: Furosemide 40 mg 1 tablet twice a day.   Labs: 05/06/2018 Creatinine 1.80, BUN 28, Potassium 4.4, Sodium 141, EGFR 34-39 02/18/2019Creatinine 2.13, BUN50, Potassium 4.8, Sodium 137, EGFR 28-32  Recommendations: No changes.   Encouraged to call for fluid symptoms.  Follow-up plan: ICM clinic phone appointment on 07/22/2018.    Copy of ICM check sent to Dr. Lovena Le.   3 month ICM trend: 06/20/2018    1 Year ICM trend:       [name], RN 06/20/2018 3:40 PM

## 2018-06-25 LAB — CUP PACEART REMOTE DEVICE CHECK
Battery Voltage: 2.94 V
Brady Statistic AP VS Percent: 0.62 %
Brady Statistic AS VP Percent: 55.01 %
Brady Statistic RA Percent Paced: 41.29 %
Date Time Interrogation Session: 20190905052307
HIGH POWER IMPEDANCE MEASURED VALUE: 55 Ohm
Implantable Lead Implant Date: 20140402
Implantable Lead Location: 753859
Implantable Lead Location: 753860
Implantable Pulse Generator Implant Date: 20140402
Lead Channel Impedance Value: 456 Ohm
Lead Channel Impedance Value: 475 Ohm
Lead Channel Pacing Threshold Amplitude: 0.75 V
Lead Channel Pacing Threshold Amplitude: 0.875 V
Lead Channel Pacing Threshold Pulse Width: 0.4 ms
Lead Channel Pacing Threshold Pulse Width: 0.4 ms
Lead Channel Pacing Threshold Pulse Width: 0.6 ms
Lead Channel Sensing Intrinsic Amplitude: 3.25 mV
Lead Channel Sensing Intrinsic Amplitude: 3.25 mV
Lead Channel Setting Pacing Amplitude: 2 V
Lead Channel Setting Pacing Amplitude: 2.5 V
Lead Channel Setting Pacing Pulse Width: 0.4 ms
MDC IDC LEAD IMPLANT DT: 20140402
MDC IDC LEAD IMPLANT DT: 20140402
MDC IDC LEAD LOCATION: 753858
MDC IDC MSMT BATTERY REMAINING LONGEVITY: 25 mo
MDC IDC MSMT LEADCHNL LV IMPEDANCE VALUE: 247 Ohm
MDC IDC MSMT LEADCHNL LV IMPEDANCE VALUE: 608 Ohm
MDC IDC MSMT LEADCHNL RA IMPEDANCE VALUE: 456 Ohm
MDC IDC MSMT LEADCHNL RA PACING THRESHOLD AMPLITUDE: 0.875 V
MDC IDC MSMT LEADCHNL RV IMPEDANCE VALUE: 418 Ohm
MDC IDC MSMT LEADCHNL RV SENSING INTR AMPL: 19.5 mV
MDC IDC MSMT LEADCHNL RV SENSING INTR AMPL: 19.5 mV
MDC IDC SET LEADCHNL LV PACING AMPLITUDE: 1.5 V
MDC IDC SET LEADCHNL LV PACING PULSEWIDTH: 0.6 ms
MDC IDC SET LEADCHNL RV SENSING SENSITIVITY: 0.3 mV
MDC IDC STAT BRADY AP VP PERCENT: 41.72 %
MDC IDC STAT BRADY AS VS PERCENT: 2.65 %
MDC IDC STAT BRADY RV PERCENT PACED: 93.7 %

## 2018-07-01 ENCOUNTER — Other Ambulatory Visit: Payer: Self-pay | Admitting: Internal Medicine

## 2018-07-22 ENCOUNTER — Other Ambulatory Visit: Payer: Self-pay | Admitting: Internal Medicine

## 2018-07-22 ENCOUNTER — Telehealth: Payer: Self-pay

## 2018-07-22 ENCOUNTER — Ambulatory Visit (INDEPENDENT_AMBULATORY_CARE_PROVIDER_SITE_OTHER): Payer: Medicare HMO

## 2018-07-22 ENCOUNTER — Other Ambulatory Visit: Payer: Self-pay | Admitting: Student in an Organized Health Care Education/Training Program

## 2018-07-22 DIAGNOSIS — Z95810 Presence of automatic (implantable) cardiac defibrillator: Secondary | ICD-10-CM | POA: Diagnosis not present

## 2018-07-22 DIAGNOSIS — I5022 Chronic systolic (congestive) heart failure: Secondary | ICD-10-CM

## 2018-07-22 NOTE — Telephone Encounter (Signed)
Remote ICM transmission received.  Attempted call to patient regarding ICM remote transmission and no answer, no voice mail set up

## 2018-07-22 NOTE — Progress Notes (Signed)
EPIC Encounter for ICM Monitoring  Patient Name: [name] is a 84 y.o. male Date: 07/22/2018 Primary Care Physican: Axel Filler, MD Primary Cardiologist:Taylor Electrophysiologist: Lovena Le Dry Weight:Previous weight 150lbs Bi-V Pacing: 97.4%      Attempted call to patient and unable to reach.    Transmission reviewed.    Thoracic impedance normal.   Prescribed: Furosemide 40 mg 1 tablet twice a day.   Labs: 05/06/2018 Creatinine 1.80, BUN 28, Potassium 4.4, Sodium 141, EGFR 34-39 02/18/2019Creatinine 2.13, BUN50, Potassium 4.8, Sodium 137, EGFR 28-32  Recommendations: Unable to reach.  Follow-up plan: ICM clinic phone appointment on 08/29/2018.   Office appointment scheduled 09/02/2018 with Dr. Lovena Le (must keep for refills).    Copy of ICM check sent to Dr. Lovena Le.   3 month ICM trend: 07/22/2018    1 Year ICM trend:       [name], RN 07/22/2018 9:41 AM

## 2018-08-29 ENCOUNTER — Ambulatory Visit (INDEPENDENT_AMBULATORY_CARE_PROVIDER_SITE_OTHER): Payer: Medicare HMO

## 2018-08-29 DIAGNOSIS — I5022 Chronic systolic (congestive) heart failure: Secondary | ICD-10-CM

## 2018-08-29 DIAGNOSIS — I472 Ventricular tachycardia, unspecified: Secondary | ICD-10-CM

## 2018-08-29 DIAGNOSIS — Z95810 Presence of automatic (implantable) cardiac defibrillator: Secondary | ICD-10-CM

## 2018-08-29 NOTE — Progress Notes (Signed)
Remote ICD transmission.   

## 2018-08-29 NOTE — Progress Notes (Signed)
EPIC Encounter for ICM Monitoring  Patient Name: [name] is a 84 y.o. male Date: 08/29/2018 Primary Care Physican: Axel Filler, MD Primary Cardiologist:Taylor Electrophysiologist: Santina Evans Pacing: 97.2%  Last Weight: 150lbs Today's Weight:  unknown       Heart Failure questions reviewed, pt asymptomatic.   Thoracic impedance normal.   Prescribed: Furosemide 40 mg 1 tablet twice a day.   Labs: 05/06/2018 Creatinine 1.80, BUN 28, Potassium 4.4, Sodium 141, EGFR 34-39 02/18/2019Creatinine 2.13, BUN50, Potassium 4.8, Sodium 137, EGFR 28-32  Recommendations: No changes.    Encouraged to call for fluid symptoms.  Follow-up plan: ICM clinic phone appointment on 10/10/2018.   Office appointment scheduled 09/02/2018 with Dr. Lovena Le.    Copy of ICM check sent to Dr. Lovena Le.   3 month ICM trend: 08/29/2018    1 Year ICM trend:       [name], RN 08/29/2018 2:34 PM

## 2018-09-02 ENCOUNTER — Telehealth: Payer: Self-pay

## 2018-09-02 ENCOUNTER — Encounter: Payer: Medicare HMO | Admitting: Internal Medicine

## 2018-09-02 NOTE — Telephone Encounter (Signed)
Advised Pt is 4:00 appt for today is cancelled.  Advised Pt to call this nurse if he can come in before 3:00 pm.  Otherwise will call Pt to reschedule tomorrow.  Left this nurse direct # to call back.

## 2018-09-02 NOTE — Telephone Encounter (Signed)
Left CVM for Pt's sister requesting Pt call office.

## 2018-09-02 NOTE — Telephone Encounter (Signed)
CVM left per DPR.  Advised Pt needed to cancel appt time scheduled for today.  Pt could come in earlier today, or could reschedule for another day.  Left this nurse name and # for call back.

## 2018-09-02 NOTE — Telephone Encounter (Signed)
Spoke with Pt's brother.  Advised needed to reschedule appt for today-Pt can come in earlier or schedule to another day.  Brother will attempt to call Pt.

## 2018-09-03 ENCOUNTER — Encounter: Payer: Self-pay | Admitting: Cardiology

## 2018-09-10 ENCOUNTER — Encounter: Payer: Medicare HMO | Admitting: Internal Medicine

## 2018-09-29 ENCOUNTER — Other Ambulatory Visit: Payer: Self-pay | Admitting: Internal Medicine

## 2018-10-10 ENCOUNTER — Ambulatory Visit (INDEPENDENT_AMBULATORY_CARE_PROVIDER_SITE_OTHER): Payer: Medicare HMO

## 2018-10-10 ENCOUNTER — Telehealth: Payer: Self-pay

## 2018-10-10 DIAGNOSIS — Z95810 Presence of automatic (implantable) cardiac defibrillator: Secondary | ICD-10-CM | POA: Diagnosis not present

## 2018-10-10 DIAGNOSIS — I5022 Chronic systolic (congestive) heart failure: Secondary | ICD-10-CM | POA: Diagnosis not present

## 2018-10-10 NOTE — Telephone Encounter (Signed)
Remote ICM transmission received.  Attempted call to patient regarding ICM remote transmission and no message.  °

## 2018-10-10 NOTE — Progress Notes (Signed)
EPIC Encounter for ICM Monitoring  Patient Name: [name] is a 85 y.o. male Date: 10/10/2018 Primary Care Physican: Axel Filler, MD Primary Cardiologist:Taylor Electrophysiologist: Santina Evans Pacing: 94.7%   Last Weight: 150lbs Today's Weight:  unknown                                                   Attempted call to patient.   Transmission reviewed.    Thoracic impedance normal.   Prescribed: Furosemide 40 mg 1 tablet twice a day.   Labs: 05/06/2018 Creatinine 1.80, BUN 28, Potassium 4.4, Sodium 141, EGFR 34-39 02/18/2019Creatinine 2.13, BUN50, Potassium 4.8, Sodium 137, EGFR 28-32  Recommendations: Unable to reach.  Follow-up plan: ICM clinic phone appointment on 11/11/2018.    Copy of ICM check sent to Dr. Lovena Le.   3 month ICM trend: 10/10/2018    1 Year ICM trend:       [name], RN 10/10/2018 2:59 PM

## 2018-10-13 LAB — CUP PACEART REMOTE DEVICE CHECK
Battery Remaining Longevity: 24 mo
Battery Voltage: 2.93 V
Brady Statistic AP VS Percent: 0.28 %
Brady Statistic AS VS Percent: 1.24 %
HIGH POWER IMPEDANCE MEASURED VALUE: 65 Ohm
Implantable Lead Implant Date: 20140402
Implantable Lead Location: 753858
Implantable Lead Model: 4194
Implantable Lead Model: 6935
Implantable Pulse Generator Implant Date: 20140402
Lead Channel Impedance Value: 285 Ohm
Lead Channel Impedance Value: 456 Ohm
Lead Channel Impedance Value: 532 Ohm
Lead Channel Pacing Threshold Amplitude: 0.875 V
Lead Channel Pacing Threshold Pulse Width: 0.4 ms
Lead Channel Pacing Threshold Pulse Width: 0.6 ms
Lead Channel Sensing Intrinsic Amplitude: 19.25 mV
Lead Channel Sensing Intrinsic Amplitude: 19.25 mV
Lead Channel Setting Pacing Amplitude: 1.5 V
Lead Channel Setting Pacing Amplitude: 2.5 V
Lead Channel Setting Pacing Pulse Width: 0.4 ms
Lead Channel Setting Pacing Pulse Width: 0.6 ms
Lead Channel Setting Sensing Sensitivity: 0.3 mV
MDC IDC LEAD IMPLANT DT: 20140402
MDC IDC LEAD IMPLANT DT: 20140402
MDC IDC LEAD LOCATION: 753859
MDC IDC LEAD LOCATION: 753860
MDC IDC MSMT LEADCHNL LV IMPEDANCE VALUE: 532 Ohm
MDC IDC MSMT LEADCHNL LV IMPEDANCE VALUE: 646 Ohm
MDC IDC MSMT LEADCHNL RA IMPEDANCE VALUE: 456 Ohm
MDC IDC MSMT LEADCHNL RA PACING THRESHOLD AMPLITUDE: 1 V
MDC IDC MSMT LEADCHNL RA SENSING INTR AMPL: 4.5 mV
MDC IDC MSMT LEADCHNL RA SENSING INTR AMPL: 4.5 mV
MDC IDC MSMT LEADCHNL RV PACING THRESHOLD AMPLITUDE: 0.75 V
MDC IDC MSMT LEADCHNL RV PACING THRESHOLD PULSEWIDTH: 0.4 ms
MDC IDC SESS DTM: 20191205094226
MDC IDC SET LEADCHNL RA PACING AMPLITUDE: 2 V
MDC IDC STAT BRADY AP VP PERCENT: 44.94 %
MDC IDC STAT BRADY AS VP PERCENT: 53.54 %
MDC IDC STAT BRADY RA PERCENT PACED: 44.76 %
MDC IDC STAT BRADY RV PERCENT PACED: 97.15 %

## 2018-10-17 ENCOUNTER — Encounter: Payer: Self-pay | Admitting: *Deleted

## 2018-10-29 ENCOUNTER — Other Ambulatory Visit: Payer: Self-pay | Admitting: Internal Medicine

## 2018-11-05 DIAGNOSIS — B309 Viral conjunctivitis, unspecified: Secondary | ICD-10-CM | POA: Diagnosis not present

## 2018-11-05 DIAGNOSIS — Z794 Long term (current) use of insulin: Secondary | ICD-10-CM | POA: Diagnosis not present

## 2018-11-05 DIAGNOSIS — H35033 Hypertensive retinopathy, bilateral: Secondary | ICD-10-CM | POA: Diagnosis not present

## 2018-11-05 DIAGNOSIS — E113293 Type 2 diabetes mellitus with mild nonproliferative diabetic retinopathy without macular edema, bilateral: Secondary | ICD-10-CM | POA: Diagnosis not present

## 2018-11-05 DIAGNOSIS — Z961 Presence of intraocular lens: Secondary | ICD-10-CM | POA: Diagnosis not present

## 2018-11-11 ENCOUNTER — Ambulatory Visit (INDEPENDENT_AMBULATORY_CARE_PROVIDER_SITE_OTHER): Payer: Medicare HMO

## 2018-11-11 DIAGNOSIS — I5022 Chronic systolic (congestive) heart failure: Secondary | ICD-10-CM | POA: Diagnosis not present

## 2018-11-11 DIAGNOSIS — Z95810 Presence of automatic (implantable) cardiac defibrillator: Secondary | ICD-10-CM | POA: Diagnosis not present

## 2018-11-12 NOTE — Progress Notes (Signed)
EPIC Encounter for ICM Monitoring  Patient Name: [name] is a 85 y.o. male Date: 11/12/2018 Primary Care Physican: Axel Filler, MD Primary Cardiologist:Taylor Electrophysiologist: Santina Evans Pacing: 99.2% Last Weight:150lbs Today's Weight: 165 lbs   Spoke with patient and he reported he is doing very well.  Denies fluid symptoms.   Thoracic impedance normal.   Prescribed:Furosemide 40 mg 1 tablet twice a day.   Labs: 05/06/2018 Creatinine 1.80, BUN 28, Potassium 4.4, Sodium 141, EGFR 34-39 02/18/2019Creatinine 2.13, BUN50, Potassium 4.8, Sodium 137, EGFR 28-32  Recommendations: Encouraged to call for fluid symptoms.  Follow-up plan: ICM clinic phone appointment on 12/16/2018  Copy of ICM check sent to Dr. Lovena Le  3 month ICM trend: 11/11/2018    1 Year ICM trend:       [name], RN 11/12/2018 3:36 PM

## 2018-11-28 ENCOUNTER — Encounter: Payer: Medicare HMO | Admitting: *Deleted

## 2018-11-28 ENCOUNTER — Other Ambulatory Visit: Payer: Self-pay | Admitting: Internal Medicine

## 2018-12-06 ENCOUNTER — Encounter: Payer: Self-pay | Admitting: Cardiology

## 2018-12-06 NOTE — Progress Notes (Signed)
Letter  

## 2018-12-16 ENCOUNTER — Other Ambulatory Visit: Payer: Self-pay

## 2018-12-16 ENCOUNTER — Ambulatory Visit (INDEPENDENT_AMBULATORY_CARE_PROVIDER_SITE_OTHER): Payer: Medicare HMO

## 2018-12-16 DIAGNOSIS — I5022 Chronic systolic (congestive) heart failure: Secondary | ICD-10-CM | POA: Diagnosis not present

## 2018-12-16 DIAGNOSIS — Z95810 Presence of automatic (implantable) cardiac defibrillator: Secondary | ICD-10-CM | POA: Diagnosis not present

## 2018-12-17 ENCOUNTER — Other Ambulatory Visit: Payer: Self-pay

## 2018-12-17 ENCOUNTER — Ambulatory Visit (INDEPENDENT_AMBULATORY_CARE_PROVIDER_SITE_OTHER): Payer: Medicare HMO | Admitting: *Deleted

## 2018-12-17 DIAGNOSIS — I472 Ventricular tachycardia, unspecified: Secondary | ICD-10-CM

## 2018-12-17 NOTE — Progress Notes (Signed)
EPIC Encounter for ICM Monitoring  Patient Name: [name] is a 85 y.o. male Date: 12/17/2018 Primary Care Physican: Vincent, Duncan Thomas, MD Primary Cardiologist:Taylor Electrophysiologist: Taylor Bi-V Pacing: 94.9% 11/11/2018 Weight: 165 lbs 12/17/2018 Weight: 160 lbs  Spoke with patient and he is asymptomatic.  Denies fluid symptoms.   Thoracic impedance normal.   Prescribed:Furosemide 40 mg 1 tablet twice a day.   Labs: 05/06/2018 Creatinine 1.80, BUN 28, Potassium 4.4, Sodium 141, EGFR 34-39 02/18/2019Creatinine 2.13, BUN50, Potassium 4.8, Sodium 137, EGFR 28-32  Recommendations:Encouraged to call for fluid symptoms.  Follow-up plan: ICM clinic phone appointment on 01/20/2019  Copy of ICM check sent to Dr.Taylor  3 month ICM trend: 12/16/2018    1 Year ICM trend:       Laurie S Short, RN 12/17/2018 9:07 AM   

## 2018-12-19 ENCOUNTER — Other Ambulatory Visit: Payer: Self-pay

## 2018-12-19 ENCOUNTER — Other Ambulatory Visit: Payer: Self-pay | Admitting: Internal Medicine

## 2018-12-19 MED ORDER — FUROSEMIDE 40 MG PO TABS: 40.0000 mg | ORAL_TABLET | Freq: Two times a day (BID) | ORAL | 0 refills | Status: DC

## 2018-12-19 MED ORDER — CARVEDILOL 12.5 MG PO TABS: 12.5000 mg | ORAL_TABLET | Freq: Two times a day (BID) | ORAL | 0 refills | Status: DC

## 2018-12-19 NOTE — Telephone Encounter (Signed)
furosemide (LASIX) 40 MG tablet   carvedilol (COREG) 12.5 MG tablet, refill  Request @  [facility], [address] AT [address] [phone] (Phone) [phone] (Fax)

## 2018-12-19 NOTE — Telephone Encounter (Signed)
Pt has not been seen since 2018, per Claiborne Billings, RN, to refill pt's medication furosemide, 3 months supply and send a note stating that pt is in need of an appointment. Please address

## 2018-12-21 LAB — CUP PACEART REMOTE DEVICE CHECK
Battery Remaining Longevity: 21 mo
Battery Voltage: 2.92 V
Brady Statistic AP VP Percent: 51.08 %
Brady Statistic AP VS Percent: 0.6 %
Brady Statistic AS VP Percent: 46.67 %
Brady Statistic AS VS Percent: 1.65 %
Brady Statistic RA Percent Paced: 50.84 %
Brady Statistic RV Percent Paced: 95.4 %
Date Time Interrogation Session: 20200323052307
HighPow Impedance: 55 Ohm
Implantable Lead Implant Date: 20140402
Implantable Lead Implant Date: 20140402
Implantable Lead Implant Date: 20140402
Implantable Lead Location: 753858
Implantable Lead Location: 753859
Implantable Lead Location: 753860
Implantable Lead Model: 4194
Implantable Lead Model: 5076
Implantable Lead Model: 6935
Implantable Pulse Generator Implant Date: 20140402
Lead Channel Impedance Value: 247 Ohm
Lead Channel Impedance Value: 399 Ohm
Lead Channel Impedance Value: 418 Ohm
Lead Channel Impedance Value: 475 Ohm
Lead Channel Impedance Value: 513 Ohm
Lead Channel Pacing Threshold Amplitude: 0.75 V
Lead Channel Pacing Threshold Amplitude: 0.875 V
Lead Channel Pacing Threshold Pulse Width: 0.4 ms
Lead Channel Pacing Threshold Pulse Width: 0.6 ms
Lead Channel Sensing Intrinsic Amplitude: 19.375 mV
Lead Channel Sensing Intrinsic Amplitude: 19.375 mV
Lead Channel Sensing Intrinsic Amplitude: 2.75 mV
Lead Channel Sensing Intrinsic Amplitude: 2.75 mV
Lead Channel Setting Pacing Amplitude: 1.5 V
Lead Channel Setting Pacing Amplitude: 2 V
Lead Channel Setting Pacing Amplitude: 2.5 V
Lead Channel Setting Pacing Pulse Width: 0.4 ms
Lead Channel Setting Sensing Sensitivity: 0.3 mV
MDC IDC MSMT LEADCHNL LV IMPEDANCE VALUE: 608 Ohm
MDC IDC MSMT LEADCHNL RA PACING THRESHOLD AMPLITUDE: 0.875 V
MDC IDC MSMT LEADCHNL RA PACING THRESHOLD PULSEWIDTH: 0.4 ms
MDC IDC SET LEADCHNL LV PACING PULSEWIDTH: 0.6 ms

## 2018-12-23 ENCOUNTER — Encounter: Payer: Self-pay | Admitting: Cardiology

## 2018-12-23 NOTE — Progress Notes (Signed)
Remote ICD transmission.   

## 2019-01-20 ENCOUNTER — Ambulatory Visit (INDEPENDENT_AMBULATORY_CARE_PROVIDER_SITE_OTHER): Payer: Medicare HMO

## 2019-01-20 ENCOUNTER — Other Ambulatory Visit: Payer: Self-pay

## 2019-01-20 DIAGNOSIS — I5022 Chronic systolic (congestive) heart failure: Secondary | ICD-10-CM

## 2019-01-20 DIAGNOSIS — Z95810 Presence of automatic (implantable) cardiac defibrillator: Secondary | ICD-10-CM | POA: Diagnosis not present

## 2019-01-21 ENCOUNTER — Other Ambulatory Visit: Payer: Self-pay | Admitting: *Deleted

## 2019-01-21 MED ORDER — CARVEDILOL 12.5 MG PO TABS: 12.5000 mg | ORAL_TABLET | Freq: Two times a day (BID) | ORAL | 0 refills | Status: DC

## 2019-01-21 NOTE — Progress Notes (Signed)
EPIC Encounter for ICM Monitoring  Patient Name: [name] is a 85 y.o. male Date: 01/21/2019 Primary Care Physican: Axel Filler, MD Primary Cardiologist:Taylor Electrophysiologist: Santina Evans Pacing: 96.5% 01/21/2019 Weight: 170 lbs  Spoke with patient and he is asymptomatic. Denies fluid symptoms.  Thoracic impedance trending just below baseline normal.   Prescribed:Furosemide 40 mg 1 tablet twice a day.   Labs: 05/06/2018 Creatinine 1.80, BUN 28, Potassium 4.4, Sodium 141, EGFR 34-39 02/18/2019Creatinine 2.13, BUN50, Potassium 4.8, Sodium 137, EGFR 28-32  Recommendations: Encouraged to call for fluid symptoms. Message sent to Dr Tanna Furry scheduler to call patient for phone/video visit.  Follow-up plan: ICM clinic phone appointment on 02/24/2019.  Copy of ICM check sent to Dr.Taylor  3 month ICM trend: 01/20/2019    1 Year ICM trend:       [name], RN 01/21/2019 8:37 AM

## 2019-02-04 ENCOUNTER — Telehealth: Payer: Self-pay | Admitting: Internal Medicine

## 2019-02-04 NOTE — Telephone Encounter (Signed)
New message   [facility] for pt to call back and change appt and time on 05.19.20 with Dr. Lovena Le to virtual. Will offer pt doxemity video if access to a smart phone, will offer phone call if no smart phone. When pt returns call please reach out via secure chat, I will speak to pt.

## 2019-02-06 ENCOUNTER — Telehealth: Payer: Self-pay | Admitting: Internal Medicine

## 2019-02-06 NOTE — Telephone Encounter (Signed)
New message   [facility] for pt to call back and change appt on 05.19.20 to virtual. Will offer doxemity video if pt has a smart phone, or just a phone call if pt does not have a smart phone. If pt returns call, please reach out via secure chat. I will speak to pt.

## 2019-02-10 NOTE — Telephone Encounter (Signed)
Follow up    Spoke to pt about appt tomorrow, he will change to a phone visit with Dr. Lovena Le. Pt number is listed in appt notes.        Virtual Visit Pre-Appointment Phone Call  "(Name), I am calling you today to discuss your upcoming appointment. We are currently trying to limit exposure to the virus that causes COVID-19 by seeing patients at home rather than in the office."  1. "What is the BEST phone number to call the day of the visit?" - include this in appointment notes  2. Do you have or have access to (through a family member/friend) a smartphone with video capability that we can use for your visit?" a. If yes - list this number in appt notes as cell (if different from BEST phone #) and list the appointment type as a VIDEO visit in appointment notes b. If no - list the appointment type as a PHONE visit in appointment notes  3. Confirm consent - "In the setting of the current Covid19 crisis, you are scheduled for a (phone or video) visit with your provider on (date) at (time).  Just as we do with many in-office visits, in order for you to participate in this visit, we must obtain consent.  If you'd like, I can send this to your mychart (if signed up) or email for you to review.  Otherwise, I can obtain your verbal consent now.  All virtual visits are billed to your insurance company just like a normal visit would be.  By agreeing to a virtual visit, we'd like you to understand that the technology does not allow for your provider to perform an examination, and thus may limit your provider's ability to fully assess your condition. If your provider identifies any concerns that need to be evaluated in person, we will make arrangements to do so.  Finally, though the technology is pretty good, we cannot assure that it will always work on either your or our end, and in the setting of a video visit, we may have to convert it to a phone-only visit.  In either situation, we cannot ensure that we  have a secure connection.  Are you willing to proceed?" STAFF: Did the patient verbally acknowledge consent to telehealth visit? Document YES/NO here: YES  4. Advise patient to be prepared - "Two hours prior to your appointment, go ahead and check your blood pressure, pulse, oxygen saturation, and your weight (if you have the equipment to check those) and write them all down. When your visit starts, your provider will ask you for this information. If you have an Apple Watch or Kardia device, please plan to have heart rate information ready on the day of your appointment. Please have a pen and paper handy nearby the day of the visit as well."  5. Give patient instructions for MyChart download to smartphone OR Doximity/Doxy.me as below if video visit (depending on what platform provider is using)  6. Inform patient they will receive a phone call 15 minutes prior to their appointment time (may be from unknown caller ID) so they should be prepared to answer    TELEPHONE CALL NOTE  [name] has been deemed a candidate for a follow-up tele-health visit to limit community exposure during the Covid-19 pandemic. I spoke with the patient via phone to ensure availability of phone/video source, confirm preferred email & phone number, and discuss instructions and expectations.  I reminded [name] to be prepared with  any vital sign and/or heart rhythm information that could potentially be obtained via home monitoring, at the time of his visit. I reminded [name] to expect a phone call prior to his visit.  [place] Harriette Ohara 02/10/2019 9:40 AM   INSTRUCTIONS FOR DOWNLOADING THE MYCHART APP TO SMARTPHONE  - The patient must first make sure to have activated MyChart and know their login information - If Apple, go to [employer] and type in MyChart in the search bar and download the app. If Android, ask patient to go to [employer] and type in [place] in the search bar and download  the app. The app is free but as with any other app downloads, their phone may require them to verify saved payment information or Apple/Android password.  - The patient will need to then log into the app with their MyChart username and password, and select [facility] as their healthcare provider to link the account. When it is time for your visit, go to the MyChart app, find appointments, and click Begin Video Visit. Be sure to Select Allow for your device to access the Microphone and Camera for your visit. You will then be connected, and your provider will be with you shortly.  **If they have any issues connecting, or need assistance please contact MyChart service desk [phone])**  **If using a computer, in order to ensure the best quality for their visit they will need to use either of the following Internet Browsers: [employer], or Google Chrome**  IF USING DOXIMITY or DOXY.ME - The patient will receive a link just prior to their visit by text.     FULL LENGTH CONSENT FOR TELE-HEALTH VISIT   I hereby voluntarily request, consent and authorize [facility] and its employed or contracted physicians, physician assistants, nurse practitioners or other licensed health care professionals (the Practitioner), to provide me with telemedicine health care services (the Services") as deemed necessary by the treating Practitioner. I acknowledge and consent to receive the Services by the Practitioner via telemedicine. I understand that the telemedicine visit will involve communicating with the Practitioner through live audiovisual communication technology and the disclosure of certain medical information by electronic transmission. I acknowledge that I have been given the opportunity to request an in-person assessment or other available alternative prior to the telemedicine visit and am voluntarily participating in the telemedicine visit.  I understand that I have the right to withhold  or withdraw my consent to the use of telemedicine in the course of my care at any time, without affecting my right to future care or treatment, and that the Practitioner or I may terminate the telemedicine visit at any time. I understand that I have the right to inspect all information obtained and/or recorded in the course of the telemedicine visit and may receive copies of available information for a reasonable fee.  I understand that some of the potential risks of receiving the Services via telemedicine include:   Delay or interruption in medical evaluation due to technological equipment failure or disruption;  Information transmitted may not be sufficient (e.g. poor resolution of images) to allow for appropriate medical decision making by the Practitioner; and/or   In rare instances, security protocols could fail, causing a breach of personal health information.  Furthermore, I acknowledge that it is my responsibility to provide information about my medical history, conditions and care that is complete and accurate to the best of my ability. I acknowledge that Practitioner's advice, recommendations, and/or  decision may be based on factors not within their control, such as incomplete or inaccurate data provided by me or distortions of diagnostic images or specimens that may result from electronic transmissions. I understand that the practice of medicine is not an exact science and that Practitioner makes no warranties or guarantees regarding treatment outcomes. I acknowledge that I will receive a copy of this consent concurrently upon execution via email to the email address I last provided but may also request a printed copy by calling the office of [facility].    I understand that my insurance will be billed for this visit.   I have read or had this consent read to me.  I understand the contents of this consent, which adequately explains the benefits and risks of the Services being provided  via telemedicine.   I have been provided ample opportunity to ask questions regarding this consent and the Services and have had my questions answered to my satisfaction.  I give my informed consent for the services to be provided through the use of telemedicine in my medical care  By participating in this telemedicine visit I agree to the above.

## 2019-02-11 ENCOUNTER — Other Ambulatory Visit: Payer: Self-pay

## 2019-02-11 ENCOUNTER — Telehealth (INDEPENDENT_AMBULATORY_CARE_PROVIDER_SITE_OTHER): Payer: Medicare HMO | Admitting: Internal Medicine

## 2019-02-11 DIAGNOSIS — I472 Ventricular tachycardia, unspecified: Secondary | ICD-10-CM

## 2019-02-11 DIAGNOSIS — I5022 Chronic systolic (congestive) heart failure: Secondary | ICD-10-CM | POA: Diagnosis not present

## 2019-02-11 DIAGNOSIS — Z95810 Presence of automatic (implantable) cardiac defibrillator: Secondary | ICD-10-CM

## 2019-02-11 NOTE — Progress Notes (Signed)
Electrophysiology TeleHealth Note   Due to national recommendations of social distancing due to COVID 19, an audio/video telehealth visit is felt to be most appropriate for this patient at this time.  See MyChart message from today for the patient's consent to telehealth for [facility].   Date:  02/11/2019   ID:  [name], DOB 10-09-1930, MRN [id number]  Location: patient's home  Provider location: [address], [place]  Evaluation Performed: Follow-up visit  PCP:  Axel Filler, MD  Cardiologist:  No primary care provider on file.  Electrophysiologist:  Dr Lovena Le  Chief Complaint:  "I'm doing ok for an old guy"  History of Present Illness:    [name] is a 84 y.o. male who presents via audio/video conferencing for a telehealth visit today. He is a pleasant 85 yo man with chronic systolic heart failure, s/p ICD insertion. He has a h/o VT. He has been gaining weight and is eating much better. Since last being seen in our clinic, he denies syncope or any ICD shocks. The patient is otherwise without complaint today.  The patient denies symptoms of fevers, chills, cough, or new SOB worrisome for COVID 19.  Past Medical History:  Diagnosis Date  . Adenomatous colon polyp 02/14/2012  . BBB (bundle branch block)    right  . Carotid stenosis    a. s/p L carotid stent 2004;  b. Carotid US (09/2014): Bilateral ICA 1-39%, left ECA >59%, normal subclavian bilaterally, occluded left vertebral >> FU 2 years  . Chronic kidney disease   . Chronic systolic CHF (congestive heart failure) (HCC)    a. ischemic CM EF 15-20%;  b. s/p AICD 05/24/04;  c. Echo 7/06: EF 30-40%, mild reduced RVSF, d. Echo 12/2015 EF 35-40%  . Elevated PSA   . HTN (hypertension)   . Hyperlipidemia   . ICD (implantable cardiac defibrillator) in place 12-25-2012   MDT CRTD upgrade by Dr Lovena Le  . Myocardial infarction ([facility]) 1990  . Pericardial effusion    Echocardiogram (09/2014):  EF 25% with distal anterior, distal inferior, distal lateral and apical akinesis, grade 1 diastolic dysfunction, very mild aortic stenosis (mean 7 mmHg) - this may be depressed due to low EF (2-D images suggest mild to moderate aortic stenosis), large pericardial effusion, no RA collapse  . PVD (peripheral vascular disease) ([facility])    s/p L carotid PTCA/stent 2004  . Transient ischemic attack   . Type II diabetes mellitus ([facility])     Past Surgical History:  Procedure Laterality Date  . BI-VENTRICULAR IMPLANTABLE CARDIOVERTER DEFIBRILLATOR UPGRADE N/A 12/25/2012   Procedure: BI-VENTRICULAR IMPLANTABLE CARDIOVERTER DEFIBRILLATOR UPGRADE;  Surgeon: Evans Lance, MD;  Location: [facility] CATH LAB;  Service: Cardiovascular;  Laterality: N/A;  . BIV ICD UPGRADE  12/25/2012   MDT CRTD upgrade by Dr Lovena Le for ischemic cardiomyopathy and worsening conduction system disease  . CARDIAC CATHETERIZATION  06/2003,  01/2004  . CARDIAC CATHETERIZATION N/A 01/13/2016   Procedure: Left Heart Cath and Coronary Angiography;  Surgeon: Jettie Booze, MD;  Location: [facility] CV LAB;  Service: Cardiovascular;  Laterality: N/A;  . CARDIAC DEFIBRILLATOR PLACEMENT  05/24/2004   Implantation of a MDT single-chamber defibrillator  . CAROTID STENT  09/11/2003   Percutaneous transluminal angioplasty and stent placement of the left internal carotid artery.  Marland Kitchen CATARACT EXTRACTION W/ INTRAOCULAR LENS  IMPLANT, BILATERAL Bilateral ~ 2010  . CORONARY ANGIOPLASTY WITH STENT PLACEMENT  1990   "2" (12/25/2012)  .  INSERT / REPLACE / REMOVE PACEMAKER    . LEAD REVISION N/A 12/25/2012   Procedure: LEAD REVISION;  Surgeon: Evans Lance, MD;  Location: [facility] CATH LAB;  Service: Cardiovascular;  Laterality: N/A;    Current Outpatient Medications  Medication Sig Dispense Refill  . aspirin EC 81 MG tablet Take 81 mg by mouth daily.    Marland Kitchen atorvastatin (LIPITOR) 40 MG tablet TAKE 1 TABLET(40 MG) BY MOUTH DAILY AT 6 PM 90 tablet 2  . Blood  Glucose Monitoring Suppl (ACCU-CHEK AVIVA PLUS) w/Device KIT Check finger stick glucose once daily 1 kit 0  . carvedilol (COREG) 12.5 MG tablet Take 1 tablet (12.5 mg total) by mouth 2 (two) times daily with a meal. Please make overdue appt with Dr. Lovena Le before anymore refills. 2nd attempt 60 tablet 0  . fluorometholone (FML) 0.1 % ophthalmic suspension Place 2 drops into both eyes daily.     . furosemide (LASIX) 40 MG tablet Take 1 tablet (40 mg total) by mouth 2 (two) times daily. Please make overdue appt with Dr. Lovena Le for future refills. 3rd attempt 180 tablet 0  . glucose blood (ACCU-CHEK AVIVA PLUS) test strip Check blood sugar up to 3 times a day 300 each 5  . insulin glargine (LANTUS) 100 UNIT/ML injection Inject 0.35 mLs (35 Units total) into the skin at bedtime. 10 mL 5  . Insulin Pen Needle (B-D ULTRAFINE III SHORT PEN) 31G X 8 MM MISC USE ONCE DAILY WITH LANTUS PEN 100 each 3  . ofloxacin (OCUFLOX) 0.3 % ophthalmic solution INT 1 GTT IN OU QID  3  . pantoprazole (PROTONIX) 40 MG tablet TAKE 1 TABLET(40 MG) BY MOUTH DAILY 90 tablet 1  . polyethylene glycol (MIRALAX / GLYCOLAX) packet Take 17 g by mouth daily. 14 each 0  . senna-docusate (SENOKOT-S) 8.6-50 MG tablet Take 2 tablets by mouth daily. 30 tablet 2  . tamsulosin (FLOMAX) 0.4 MG CAPS capsule Take 0.8 mg by mouth daily after supper.    . VENTOLIN HFA 108 (90 Base) MCG/ACT inhaler INHALE 2 PUFFS INTO THE LUNGS EVERY 6 HOURS AS NEEDED FOR WHEEZING OR SHORTNESS OF BREATH 18 g 5   No current facility-administered medications for this visit.     Allergies:   Patient has no known allergies.   Social History:  The patient  reports that he quit smoking about 51 years ago. His smoking use included cigarettes. He has never used smokeless tobacco. He reports that he does not drink alcohol or use drugs.   Family History:  The patient's family history includes Diabetes in his brother and mother.   ROS:  Please see the history of  present illness.   All other systems are personally reviewed and negative.    Exam:    Vital Signs:  There were no vitals taken for this visit. BP - 120/80, P - 65, Wt. - 170 lb   Labs/Other Tests and Data Reviewed:    Recent Labs: 05/06/2018: BUN 28; Creatinine, Ser 1.80; Hemoglobin 12.7; Platelets 223; Potassium 4.8; Sodium 141   Wt Readings from Last 3 Encounters:  05/06/18 136 lb 3.2 oz (61.8 kg)  11/12/17 131 lb 9.6 oz (59.7 kg)  08/06/17 128 lb (58.1 kg)     Other studies personally reviewed: Last device remote is reviewed from [facility] PDF dated 12/17/18 which reveals normal device function, no arrhythmias    ASSESSMENT & PLAN:    1.  VT - he has had no recurrent episodes. He will  continue his current meds. 2. Chronic systolic heart failure - his symptoms are much improved. He is encouraged to maintain a low sodium diet. 3. ICD - his device is working normally. We will recheck in several months. 4. COVID 19 screen The patient denies symptoms of COVID 19 at this time.  The importance of social distancing was discussed today.  Follow-up:  9/20 Next remote: 6/20  Current medicines are reviewed at length with the patient today.   The patient does not have concerns regarding his medicines.  The following changes were made today:  none  Labs/ tests ordered today include: none No orders of the defined types were placed in this encounter.    Patient Risk:  after full review of this patients clinical status, I feel that they are at moderate risk at this time.  Today, I have spent 25 minutes with the patient with telehealth technology discussing all of the above.    Signed, Cristopher Peru, MD  02/11/2019 9:24 AM     [place] [place] [address] [phone] (office) [phone] (fax)

## 2019-02-24 ENCOUNTER — Ambulatory Visit (INDEPENDENT_AMBULATORY_CARE_PROVIDER_SITE_OTHER): Payer: Medicare HMO

## 2019-02-24 DIAGNOSIS — Z95810 Presence of automatic (implantable) cardiac defibrillator: Secondary | ICD-10-CM

## 2019-02-24 DIAGNOSIS — I5022 Chronic systolic (congestive) heart failure: Secondary | ICD-10-CM

## 2019-02-26 NOTE — Progress Notes (Signed)
EPIC Encounter for ICM Monitoring  Patient Name: [name] is a 85 y.o. male Date: 02/26/2019 Primary Care Physican: Vincent, Duncan Thomas, MD Primary Cardiologist:Taylor Electrophysiologist: Taylor Bi-V Pacing: 98.3% 01/21/2019 Weight: 170 lbs  Transmission reviewed.  Optivol Thoracic impedance normal.   Prescribed:Furosemide 40 mg 1 tablet twice a day.   Labs: 05/06/2018 Creatinine 1.80, BUN 28, Potassium 4.4, Sodium 141, EGFR 34-39 02/18/2019Creatinine 2.13, BUN50, Potassium 4.8, Sodium 137, EGFR 28-32  Recommendations: Encouraged to call for fluid symptoms.   Follow-up plan: ICM clinic phone appointment on7/02/2019.  Copy of ICM check sent to Dr.Taylor   3 month ICM trend: 02/24/2019    1 Year ICM trend:        S , RN 02/26/2019 1:52 PM   

## 2019-03-03 ENCOUNTER — Other Ambulatory Visit: Payer: Self-pay

## 2019-03-03 ENCOUNTER — Ambulatory Visit: Payer: Medicare HMO | Admitting: Pharmacist

## 2019-03-03 ENCOUNTER — Ambulatory Visit (INDEPENDENT_AMBULATORY_CARE_PROVIDER_SITE_OTHER): Payer: Medicare HMO | Admitting: Student in an Organized Health Care Education/Training Program

## 2019-03-03 ENCOUNTER — Encounter: Payer: Self-pay | Admitting: Student in an Organized Health Care Education/Training Program

## 2019-03-03 VITALS — BP 112/65 | HR 65 | Temp 97.9°F | Wt 124.3 lb

## 2019-03-03 DIAGNOSIS — E1151 Type 2 diabetes mellitus with diabetic peripheral angiopathy without gangrene: Secondary | ICD-10-CM | POA: Diagnosis not present

## 2019-03-03 DIAGNOSIS — Z794 Long term (current) use of insulin: Secondary | ICD-10-CM

## 2019-03-03 DIAGNOSIS — N183 Chronic kidney disease, stage 3 unspecified: Secondary | ICD-10-CM

## 2019-03-03 DIAGNOSIS — J029 Acute pharyngitis, unspecified: Secondary | ICD-10-CM

## 2019-03-03 DIAGNOSIS — E1122 Type 2 diabetes mellitus with diabetic chronic kidney disease: Secondary | ICD-10-CM | POA: Diagnosis not present

## 2019-03-03 DIAGNOSIS — R339 Retention of urine, unspecified: Secondary | ICD-10-CM | POA: Diagnosis not present

## 2019-03-03 DIAGNOSIS — R634 Abnormal weight loss: Secondary | ICD-10-CM

## 2019-03-03 DIAGNOSIS — Z79899 Other long term (current) drug therapy: Secondary | ICD-10-CM | POA: Diagnosis not present

## 2019-03-03 DIAGNOSIS — J028 Acute pharyngitis due to other specified organisms: Secondary | ICD-10-CM | POA: Diagnosis not present

## 2019-03-03 LAB — POCT GLYCOSYLATED HEMOGLOBIN (HGB A1C): Hemoglobin A1C: 8.1 % — AB (ref 4.0–5.6)

## 2019-03-03 LAB — GLUCOSE, CAPILLARY: Glucose-Capillary: 169 mg/dL — ABNORMAL HIGH (ref 70–99)

## 2019-03-03 MED ORDER — INSULIN GLARGINE 100 UNIT/ML SOLOSTAR PEN: 20.0000 [IU] | PEN_INJECTOR | Freq: Every day | SUBCUTANEOUS | 0 refills | Status: DC

## 2019-03-03 MED ORDER — ATORVASTATIN CALCIUM 40 MG PO TABS: ORAL_TABLET | ORAL | 3 refills | Status: DC

## 2019-03-03 MED ORDER — SEMAGLUTIDE 3 MG PO TABS: 1.0000 | ORAL_TABLET | Freq: Every day | ORAL | 0 refills | Status: DC

## 2019-03-03 NOTE — Assessment & Plan Note (Signed)
Mild viral pharyngitis with accompanying hoarseness today.  Only 4 days in duration.  No other symptoms to suggest COVID-19.  Plan is for supportive care.  If he has persistence hoarseness, will refer to ENT for laryngoscopy.

## 2019-03-03 NOTE — Patient Instructions (Signed)
You are doing great at home.  Blood sugars look good.  We will check your blood work today and I will send you a letter with the results.

## 2019-03-03 NOTE — Progress Notes (Signed)
   Assessment and Plan:  See Encounters tab for problem-based medical decision making.   __________________________________________________________  HPI:   85 year old man here for follow-up of diabetes.  Doing very well at home. Denies any adverse side effects to his medications.  Recently started on oral semaglutide which she is tolerating well.  Reports excellent exertional capacity.  Says that he walks around his building frequently.  Lives in assisted living facility.  No chronic cases at facility recently.  He has not been tested for COVID-19.  No recent illnesses.  Uses insulin every night.  Does not really check his blood sugars as he does not like sticking his fingers.  Says he eats what he wants, though continues to have significant weight loss.  __________________________________________________________  Problem List: Patient Active Problem List   Diagnosis Date Noted  . Weight loss 12/11/2016    Priority: High  . Urinary retention 01/13/2016    Priority: High  . [name] (chronic kidney disease) stage 3, GFR 30-59 ml/min (HCC) 12/04/2014    Priority: High  . Chronic systolic congestive heart failure ([facility]) 12/25/2007    Priority: High  . Type 2 diabetes mellitus with peripheral vascular disease ([place]) 01/01/1989    Priority: High  . Constipation 03/10/2009    Priority: Medium  . Coronary atherosclerosis 12/25/2007    Priority: Medium  . Hypertensive retinopathy of both eyes 02/05/2018    Priority: Low  . Mild nonproliferative diabetic retinopathy of both eyes without macular edema associated with type 2 diabetes mellitus ([place]) 02/05/2018    Priority: Low  . Sensorineural hearing loss (SNHL), bilateral 12/11/2017    Priority: Low  . Dry eye syndrome of both eyes 06/06/2017    Priority: Low  . Advanced care planning/counseling discussion 12/11/2016    Priority: Low  . Healthcare maintenance 11/22/2015    Priority: Low  . Automatic implantable cardioverter-defibrillator  in situ 01/16/2012    Priority: Low  . Hyperlipidemia 01/02/2007    Priority: Low  . Viral pharyngitis 03/03/2019    Medications: Reconciled today in Epic __________________________________________________________  Physical Exam:  Vital Signs: Vitals:   03/03/19 0935  BP: 112/65  Pulse: 65  Temp: 97.9 F (36.6 C)  TempSrc: Oral  Weight: 124 lb 4.8 oz (56.4 kg)    Gen: Thin and frail appearing elderly gentleman ENT: OP clear mild erythema of the posterior oropharynx, no thrush Eyes: normal racial melanosis, mild injection of both sclera Neck: No cervical LAD, No thyromegaly or nodules, No JVD. CV: RRR, no murmurs Pulm: Normal effort, CTA throughout, no wheezing Ext: Warm, no edema, normal joints  PVR 28ml

## 2019-03-03 NOTE — Assessment & Plan Note (Signed)
Hemoglobin A1c 8.1%, much improved from last year.  Continue with Lantus insulin 35 units at nights.  We recently added on oral semaglutide which he is tolerating well, we will continue to uptitrate this medication monthly as he tolerates.  Monitoring his weight loss carefully, current weight is 124 pounds.  Foot exam okay today.  Will check urine microalbumin.

## 2019-03-03 NOTE — Assessment & Plan Note (Signed)
Stable with a GFR just a little over 30.  Plan to recheck BMP today, every 6 months after that.

## 2019-03-03 NOTE — Progress Notes (Signed)
Patient was seen today in a co-visit. Patient is being initiated on GLP-1 agonist for CV benefit per PCP approval. Patient is adamant about switching from injectable DM medication to oral. Investigated coverage for oral semaglutide (Rybelsus)---[facility] pharmacist quoted $3 copay. Will initiate Rybelsus 3 mg daily and reduce insulin incrementally while titrating Rybelsus each month. Patient education was provided and he verbalized understanding. Also sent prescriptions to [facility] requesting further pharmacist-patient education at point of sale.  Will follow up with patient via telehealth in 1 week and at least monthly during therapy changes. See documentation under Dr. Autumn Patty visit for details.

## 2019-03-03 NOTE — Assessment & Plan Note (Signed)
History of bladder outlet obstruction requiring indwelling catheter in 2017.  Symptomatically doing well.  Most likely due to BPH.  Continue treatment with Flomax 0.8 mg daily.  Postvoid residual today was fine.

## 2019-03-04 ENCOUNTER — Other Ambulatory Visit: Payer: Self-pay | Admitting: Internal Medicine

## 2019-03-10 ENCOUNTER — Other Ambulatory Visit: Payer: Self-pay | Admitting: *Deleted

## 2019-03-11 MED ORDER — PANTOPRAZOLE SODIUM 40 MG PO TBEC: DELAYED_RELEASE_TABLET | ORAL | 1 refills | Status: DC

## 2019-03-14 ENCOUNTER — Telehealth: Payer: Self-pay | Admitting: Pharmacist

## 2019-03-14 NOTE — Progress Notes (Signed)
Patient was contacted for follow up DM medication management after visit on 03/03/2019. At that visit, Lantus (insulin glargine) dose was decreased from 35 to 20 units and Rybelsus (oral semaglutide) was initiated per PCP approval.  Today, patient reports no concerns, home BG in the 130s. He correctly states what he is taking for DM medication management and was advised to contact clinic if questions or concerns arise. Will notify PCP and plan to contact patient in 2 weeks for further evaluation of semaglutide titration and Lantus taper depending on patient response. No changes were made today. Patient verbalized understanding by repeat back.

## 2019-03-18 ENCOUNTER — Encounter: Payer: Medicare HMO | Admitting: *Deleted

## 2019-03-19 ENCOUNTER — Telehealth: Payer: Self-pay

## 2019-03-19 NOTE — Telephone Encounter (Signed)
Left message for patient to remind of missed remote transmission.  

## 2019-03-27 ENCOUNTER — Telehealth: Payer: Self-pay | Admitting: *Deleted

## 2019-03-27 ENCOUNTER — Other Ambulatory Visit: Payer: Self-pay

## 2019-03-27 ENCOUNTER — Ambulatory Visit (HOSPITAL_COMMUNITY)
Admission: EM | Admit: 2019-03-27 | Discharge: 2019-03-27 | Disposition: A | Discharge status: Home / Self Care | Payer: Medicare HMO | Attending: Family Medicine | Admitting: Family Medicine

## 2019-03-27 ENCOUNTER — Encounter (HOSPITAL_COMMUNITY): Payer: Self-pay

## 2019-03-27 DIAGNOSIS — E119 Type 2 diabetes mellitus without complications: Secondary | ICD-10-CM | POA: Diagnosis not present

## 2019-03-27 DIAGNOSIS — R1013 Epigastric pain: Secondary | ICD-10-CM

## 2019-03-27 DIAGNOSIS — R112 Nausea with vomiting, unspecified: Secondary | ICD-10-CM

## 2019-03-27 LAB — GLUCOSE, CAPILLARY: Glucose-Capillary: 117 mg/dL — ABNORMAL HIGH (ref 70–99)

## 2019-03-27 MED ORDER — ONDANSETRON 4 MG PO TBDP: 4.0000 mg | ORAL_TABLET | Freq: Three times a day (TID) | ORAL | 0 refills | Status: DC | PRN

## 2019-03-27 MED ORDER — ALUM & MAG HYDROXIDE-SIMETH 200-200-20 MG/5ML PO SUSP: 15.0000 mL | Freq: Four times a day (QID) | ORAL | 0 refills | Status: DC | PRN

## 2019-03-27 MED ORDER — ALUM & MAG HYDROXIDE-SIMETH 200-200-20 MG/5ML PO SUSP
ORAL | Status: AC
  Filled 2019-03-27: qty 30

## 2019-03-27 MED ORDER — ALUM & MAG HYDROXIDE-SIMETH 200-200-20 MG/5ML PO SUSP
30.0000 mL | Freq: Once | ORAL | Status: AC
  Administered 2019-03-27: 30 mL via ORAL

## 2019-03-27 MED ORDER — LIDOCAINE VISCOUS HCL 2 % MT SOLN
15.0000 mL | Freq: Once | OROMUCOSAL | Status: AC
  Administered 2019-03-27: 15 mL via ORAL

## 2019-03-27 MED ORDER — LIDOCAINE VISCOUS HCL 2 % MT SOLN
OROMUCOSAL | Status: AC
  Filled 2019-03-27: qty 15

## 2019-03-27 NOTE — Telephone Encounter (Signed)
I agree. Thank you.

## 2019-03-27 NOTE — Discharge Instructions (Signed)
We gave you a Gi cocktail today- lidocaine and Maalox  Continue zofran as needed for nausea- dissolves under tongue Maalox as needed for further indigestion  Drink plenty of fluids Attempt to eat some solid foods today  Follow up here or emergency room if developing increased pain, unable to keep liquids/solids down or chest pain, fevers, shortness of breath

## 2019-03-27 NOTE — Telephone Encounter (Signed)
An older male calls and states she is calling for pt, states he cannot call he is so sick, "throwing up for 2 days, all day, all night" not keeping anything down, does not know about fevers, states pt cannot talk on phone. She is advised to call 911, states she cant do that pt said no, she will bring him to hospital ED. She is advised to do so asap

## 2019-03-27 NOTE — ED Triage Notes (Signed)
Patient presents to Urgent Care with complaints of emesis yesterday. Patient reports he has not had any vomiting today. Pt is hard of hearing.

## 2019-03-28 NOTE — ED Provider Notes (Signed)
[place]    CSN: [id number] Arrival date & time: 03/27/19  1318      History   Chief Complaint Chief Complaint  Patient presents with  . Emesis    HPI [name] is a 85 y.o. male history of [name], systolic CHF, hypertension, hyperlipidemia, CAD, DM type II, presenting today for evaluation of nausea vomiting and abdominal pain.  Patient states that yesterday he had multiple episodes of nausea and vomiting.  States that it began after eating his precooked meals.  He lives in an apartment.  He states that the vomiting has not persisted into today, but has had continued nausea discomfort in his upper stomach as well as a sore throat/difficulty talking.  He takes daily Protonix for reflux.  Denies chest pain or shortness of breath.  Denies changes in bowel movements, denies diarrhea or constipation.  Denies fevers chills or body aches.  Denies any URI symptoms of congestion cough or sore throat.  HPI  Past Medical History:  Diagnosis Date  . Adenomatous colon polyp 02/14/2012  . BBB (bundle branch block)    right  . Carotid stenosis    a. s/p L carotid stent 2004;  b. Carotid US (09/2014): Bilateral ICA 1-39%, left ECA >59%, normal subclavian bilaterally, occluded left vertebral >> FU 2 years  . Chronic kidney disease   . Chronic systolic CHF (congestive heart failure) (HCC)    a. ischemic CM EF 15-20%;  b. s/p AICD 05/24/04;  c. Echo 7/06: EF 30-40%, mild reduced RVSF, d. Echo 12/2015 EF 35-40%  . Elevated PSA   . HTN (hypertension)   . Hyperlipidemia   . ICD (implantable cardiac defibrillator) in place 12-25-2012   MDT CRTD upgrade by Dr Lovena Le  . Myocardial infarction ([place]) 1990  . Pericardial effusion    Echocardiogram (09/2014): EF 25% with distal anterior, distal inferior, distal lateral and apical akinesis, grade 1 diastolic dysfunction, very mild aortic stenosis (mean 7 mmHg) - this may be depressed due to low EF (2-D images suggest mild to moderate aortic  stenosis), large pericardial effusion, no RA collapse  . PVD (peripheral vascular disease) ([place])    s/p L carotid PTCA/stent 2004  . Transient ischemic attack   . Type II diabetes mellitus [facility])     Patient Active Problem List   Diagnosis Date Noted  . Viral pharyngitis 03/03/2019  . Hypertensive retinopathy of both eyes 02/05/2018  . Mild nonproliferative diabetic retinopathy of both eyes without macular edema associated with type 2 diabetes mellitus ([place]) 02/05/2018  . Sensorineural hearing loss (SNHL), bilateral 12/11/2017  . Dry eye syndrome of both eyes 06/06/2017  . Weight loss 12/11/2016  . Advanced care planning/counseling discussion 12/11/2016  . Urinary retention 01/13/2016  . Healthcare maintenance 11/22/2015  . [name] (chronic kidney disease) stage 3, GFR 30-59 ml/min (HCC) 12/04/2014  . Automatic implantable cardioverter-defibrillator in situ 01/16/2012  . Constipation 03/10/2009  . Coronary atherosclerosis 12/25/2007  . Chronic systolic congestive heart failure ([place]) 12/25/2007  . Hyperlipidemia 01/02/2007  . Type 2 diabetes mellitus with peripheral vascular disease ([place]) 01/01/1989    Past Surgical History:  Procedure Laterality Date  . BI-VENTRICULAR IMPLANTABLE CARDIOVERTER DEFIBRILLATOR UPGRADE N/A 12/25/2012   Procedure: BI-VENTRICULAR IMPLANTABLE CARDIOVERTER DEFIBRILLATOR UPGRADE;  Surgeon: Evans Lance, MD;  Location: [facility] CATH LAB;  Service: Cardiovascular;  Laterality: N/A;  . BIV ICD UPGRADE  12/25/2012   MDT CRTD upgrade by Dr Lovena Le for ischemic cardiomyopathy and worsening conduction system disease  . CARDIAC CATHETERIZATION  06/2003,  01/2004  . CARDIAC CATHETERIZATION N/A 01/13/2016   Procedure: Left Heart Cath and Coronary Angiography;  Surgeon: Jettie Booze, MD;  Location: [place] CV LAB;  Service: Cardiovascular;  Laterality: N/A;  . CARDIAC DEFIBRILLATOR PLACEMENT  05/24/2004   Implantation of a MDT single-chamber defibrillator  . CAROTID STENT   09/11/2003   Percutaneous transluminal angioplasty and stent placement of the left internal carotid artery.  Marland Kitchen CATARACT EXTRACTION W/ INTRAOCULAR LENS  IMPLANT, BILATERAL Bilateral ~ 2010  . CORONARY ANGIOPLASTY WITH STENT PLACEMENT  1990   "2" (12/25/2012)  . INSERT / REPLACE / REMOVE PACEMAKER    . LEAD REVISION N/A 12/25/2012   Procedure: LEAD REVISION;  Surgeon: Evans Lance, MD;  Location: [facility] CATH LAB;  Service: Cardiovascular;  Laterality: N/A;       Home Medications    Prior to Admission medications   Medication Sig Start Date End Date Taking? Authorizing Provider  aspirin EC 81 MG tablet Take 81 mg by mouth daily.   Yes [provider]  atorvastatin (LIPITOR) 40 MG tablet TAKE 1 TABLET(40 MG) BY MOUTH DAILY AT 6 PM 03/03/19  Yes Axel Filler, MD  Blood Glucose Monitoring Suppl (ACCU-CHEK AVIVA PLUS) w/Device KIT Check finger stick glucose once daily 05/06/18  Yes Axel Filler, MD  carvedilol (COREG) 12.5 MG tablet Take 1 tablet (12.5 mg total) by mouth 2 (two) times daily with a meal. 03/05/19  Yes Axel Filler, MD  furosemide (LASIX) 40 MG tablet Take 1 tablet (40 mg total) by mouth 2 (two) times daily. Please make overdue appt with Dr. Lovena Le for future refills. 3rd attempt 12/19/18  Yes Evans Lance, MD  glucose blood (ACCU-CHEK AVIVA PLUS) test strip Check blood sugar up to 3 times a day 08/06/17  Yes Axel Filler, MD  Insulin Pen Needle (B-D ULTRAFINE III SHORT PEN) 31G X 8 MM MISC USE ONCE DAILY WITH LANTUS PEN 05/06/18  Yes Axel Filler, MD  ofloxacin (OCUFLOX) 0.3 % ophthalmic solution INT 1 GTT IN OU QID 04/30/18  Yes [provider]  pantoprazole (PROTONIX) 40 MG tablet TAKE 1 TABLET(40 MG) BY MOUTH DAILY 03/11/19  Yes Axel Filler, MD  Semaglutide (RYBELSUS) 3 MG TABS Take 1 tablet by mouth daily. 03/03/19  Yes Axel Filler, MD  tamsulosin (FLOMAX) 0.4 MG CAPS capsule Take 0.8 mg by mouth  daily after supper.   Yes [provider]  alum & mag hydroxide-simeth (MAALOX/MYLANTA) 200-200-20 MG/5ML suspension Take 15 mLs by mouth every 6 (six) hours as needed for indigestion or heartburn. 03/27/19   Proctor Carriker C, PA-C  fluorometholone (FML) 0.1 % ophthalmic suspension Place 2 drops into both eyes daily.  06/23/16   [provider]  Insulin Glargine (LANTUS SOLOSTAR) 100 UNIT/ML Solostar Pen Inject 20 Units into the skin daily. 03/03/19   Axel Filler, MD  ondansetron (ZOFRAN-ODT) 4 MG disintegrating tablet Take 1 tablet (4 mg total) by mouth every 8 (eight) hours as needed for nausea or vomiting. 03/27/19   Azari Janssens C, PA-C  polyethylene glycol (MIRALAX / GLYCOLAX) packet Take 17 g by mouth daily. 09/13/17   Jola Schmidt, MD  senna-docusate (SENOKOT-S) 8.6-50 MG tablet Take 2 tablets by mouth daily. 05/04/16   Konrad Felix, PA  VENTOLIN HFA 108 (90 Base) MCG/ACT inhaler INHALE 2 PUFFS INTO THE LUNGS EVERY 6 HOURS AS NEEDED FOR WHEEZING OR SHORTNESS OF BREATH 03/15/16   Axel Filler, MD  Family History Family History  Problem Relation Age of Onset  . Diabetes Mother   . Healthy Father   . Diabetes Brother   . Heart attack Neg Hx   . Stroke Neg Hx     Social History Social History   Tobacco Use  . Smoking status: Former Smoker    Types: Cigarettes    Quit date: 12/27/1967    Years since quitting: 51.2  . Smokeless tobacco: Never Used  Substance Use Topics  . Alcohol use: No    Alcohol/week: 0.0 standard drinks    Comment: 12/25/2012 "quit all alcohol 60 yr ago"  . Drug use: No     Allergies   Patient has no known allergies.   Review of Systems Review of Systems  Constitutional: Negative for activity change, appetite change, chills, fatigue and fever.  HENT: Negative for congestion, ear pain, rhinorrhea, sinus pressure, sore throat and trouble swallowing.   Eyes: Negative for discharge and redness.  Respiratory: Negative  for cough, chest tightness and shortness of breath.   Cardiovascular: Negative for chest pain.  Gastrointestinal: Positive for abdominal pain, nausea and vomiting. Negative for diarrhea.  Musculoskeletal: Negative for myalgias.  Skin: Negative for rash.  Neurological: Negative for dizziness, light-headedness and headaches.     Physical Exam Triage Vital Signs ED Triage Vitals  Enc Vitals Group     BP 03/27/19 1351 104/69     Pulse Rate 03/27/19 1351 88     Resp 03/27/19 1351 16     Temp 03/27/19 1351 98.3 F (36.8 C)     Temp Source 03/27/19 1351 Oral     SpO2 03/27/19 1351 100 %     Weight --      Height --      Head Circumference --      Peak Flow --      Pain Score 03/27/19 1346 4     Pain Loc --      Pain Edu? --      Excl. in [place]? --    No data found.  Updated Vital Signs BP 104/69 (BP Location: Right Arm)   Pulse 88   Temp 98.3 F (36.8 C) (Oral)   Resp 16   SpO2 100%   Visual Acuity Right Eye Distance:   Left Eye Distance:   Bilateral Distance:    Right Eye Near:   Left Eye Near:    Bilateral Near:     Physical Exam Vitals signs and nursing note reviewed.  Constitutional:      Appearance: He is well-developed.     Comments: Thin elderly male, no acute distress  HENT:     Head: Normocephalic and atraumatic.     Mouth/Throat:     Comments: Oral mucosa pink and moist, no tonsillar enlargement or exudate. Posterior pharynx patent and nonerythematous, no uvula deviation or swelling. Normal phonation. Tongue has black discoloration to it Eyes:     Conjunctiva/sclera: Conjunctivae normal.     Pupils: Pupils are equal, round, and reactive to light.  Neck:     Musculoskeletal: Neck supple.  Cardiovascular:     Rate and Rhythm: Normal rate and regular rhythm.     Heart sounds: No murmur.  Pulmonary:     Effort: Pulmonary effort is normal. No respiratory distress.     Breath sounds: Normal breath sounds.     Comments: Breathing comfortably at rest,  CTABL, no wheezing, rales or other adventitious sounds auscultated  Abdominal:     Palpations: Abdomen  is soft.     Tenderness: There is no abdominal tenderness.     Comments: Soft, nondistended, nontender light deep palpation throughout abdomen  Skin:    General: Skin is warm and dry.  Neurological:     Mental Status: He is alert.      UC Treatments / Results  Labs (all labs ordered are listed, but only abnormal results are displayed) Labs Reviewed  GLUCOSE, CAPILLARY - Abnormal; Notable for the following components:      Result Value   Glucose-Capillary 117 (*)    All other components within normal limits  CBG MONITORING, ED    EKG   Radiology No results found.  Procedures Procedures (including critical care time)  Medications Ordered in UC Medications  alum & mag hydroxide-simeth (MAALOX/MYLANTA) 200-200-20 MG/5ML suspension 30 mL (30 mLs Oral Given 03/27/19 1416)    And  lidocaine (XYLOCAINE) 2 % viscous mouth solution 15 mL (15 mLs Oral Given 03/27/19 1416)  alum & mag hydroxide-simeth (MAALOX/MYLANTA) 200-200-20 MG/5ML suspension (has no administration in time range)  lidocaine (XYLOCAINE) 2 % viscous mouth solution (has no administration in time range)    Initial Impression / Assessment and Plan / UC Course  I have reviewed the triage vital signs and the nursing notes.  Pertinent labs & imaging results that were available during my care of the patient were reviewed by me and considered in my medical decision making (see chart for details).    1 day of nausea and vomiting, vomiting has resolved.  Vital signs stable.  Possible viral gastroenteritis.  Continues to have dental discomfort today, provided GI cocktail with relief.  Most likely related to reflux from vomiting yesterday.  Will have continue Protonix, supplement with Maalox as needed.  Zofran as needed for nausea, push fluids, slowly transition diet.  Continue to monitor discomfort and symptoms, follow-up if  not returning to normal oral intake.Discussed strict return precautions. Patient verbalized understanding and is agreeable with plan.  Final Clinical Impressions(s) / UC Diagnoses   Final diagnoses:  Epigastric pain  Nausea and vomiting, intractability of vomiting not specified, unspecified vomiting type     Discharge Instructions     We gave you a Gi cocktail today- lidocaine and Maalox  Continue zofran as needed for nausea- dissolves under tongue Maalox as needed for further indigestion  Drink plenty of fluids Attempt to eat some solid foods today  Follow up here or emergency room if developing increased pain, unable to keep liquids/solids down or chest pain, fevers, shortness of breath   ED Prescriptions    Medication Sig Dispense Auth. Provider   ondansetron (ZOFRAN-ODT) 4 MG disintegrating tablet Take 1 tablet (4 mg total) by mouth every 8 (eight) hours as needed for nausea or vomiting. 20 tablet Zonia Caplin C, PA-C   alum & mag hydroxide-simeth (MAALOX/MYLANTA) 200-200-20 MG/5ML suspension Take 15 mLs by mouth every 6 (six) hours as needed for indigestion or heartburn. 355 mL Oumar Marcott C, PA-C     Controlled Substance Prescriptions  Controlled Substance Registry consulted? Not Applicable   Janith Lima, [place] 03/28/19 1329

## 2019-03-31 ENCOUNTER — Ambulatory Visit (INDEPENDENT_AMBULATORY_CARE_PROVIDER_SITE_OTHER): Payer: Medicare HMO

## 2019-03-31 DIAGNOSIS — Z95810 Presence of automatic (implantable) cardiac defibrillator: Secondary | ICD-10-CM | POA: Diagnosis not present

## 2019-03-31 DIAGNOSIS — I5022 Chronic systolic (congestive) heart failure: Secondary | ICD-10-CM | POA: Diagnosis not present

## 2019-04-01 NOTE — Progress Notes (Signed)
EPIC Encounter for ICM Monitoring  Patient Name: [name] is a 85 y.o. male Date: 04/01/2019 Primary Care Physican: Vincent, Duncan Thomas, MD Primary Cardiologist:Taylor Electrophysiologist: Taylor Bi-V Pacing: 99.9% 03/03/2019 Weight:124lbs (office visit)  Transmission reviewed.  Optivol Thoracic impedancenormal.   Prescribed:Furosemide 40 mg 1 tablet twice a day.   Labs: 05/06/2018 Creatinine 1.80, BUN 28, Potassium 4.4, Sodium 141, EGFR 34-39 02/18/2019Creatinine 2.13, BUN50, Potassium 4.8, Sodium 137, EGFR 28-32  Recommendations: None  Follow-up plan: ICM clinic phone appointment on8/06/2019.  Copy of ICM check sent to Dr.Taylor   3 month ICM trend: 03/31/2019    1 Year ICM trend:        S , RN 04/01/2019 1:34 PM   

## 2019-04-04 ENCOUNTER — Telehealth: Payer: Self-pay | Admitting: Pharmacist

## 2019-04-04 DIAGNOSIS — E1151 Type 2 diabetes mellitus with diabetic peripheral angiopathy without gangrene: Secondary | ICD-10-CM

## 2019-04-04 MED ORDER — XULTOPHY 100-3.6 UNIT-MG/ML ~~LOC~~ SOPN: 20.0000 [IU] | PEN_INJECTOR | Freq: Every day | SUBCUTANEOUS | 3 refills | Status: DC

## 2019-04-04 NOTE — Telephone Encounter (Signed)
Contacted patient for follow up DM medication management. Last week, patient had severe epigastric pain requiring care. He states he is doing better today and is interested in trying a different GLP-1 agonist for tolerability. Discontinued semaglutide (Rybelsus) and initiated liraglutide in combination with basal insulin (Xultophy). Will follow up with patient in 1-2 weeks to evaluate response.  Overall, patient reports controlled home BG around 118-119 fasting, no BG > 200, none < 70. No symptoms of concern today. Advised patient to contact clinic if concerns arise and he verbalized understanding. Will notify PCP of findings.

## 2019-04-14 ENCOUNTER — Other Ambulatory Visit: Payer: Self-pay | Admitting: Internal Medicine

## 2019-04-17 ENCOUNTER — Telehealth: Payer: Self-pay

## 2019-04-22 NOTE — Progress Notes (Signed)
Unable to reach patient for follow up DM medication management.

## 2019-05-05 ENCOUNTER — Ambulatory Visit (INDEPENDENT_AMBULATORY_CARE_PROVIDER_SITE_OTHER): Payer: Medicare HMO

## 2019-05-05 DIAGNOSIS — Z95810 Presence of automatic (implantable) cardiac defibrillator: Secondary | ICD-10-CM

## 2019-05-05 DIAGNOSIS — I5022 Chronic systolic (congestive) heart failure: Secondary | ICD-10-CM

## 2019-05-06 ENCOUNTER — Telehealth: Payer: Self-pay

## 2019-05-06 ENCOUNTER — Ambulatory Visit (INDEPENDENT_AMBULATORY_CARE_PROVIDER_SITE_OTHER): Payer: Medicare HMO | Admitting: *Deleted

## 2019-05-06 DIAGNOSIS — I472 Ventricular tachycardia, unspecified: Secondary | ICD-10-CM

## 2019-05-06 DIAGNOSIS — Z961 Presence of intraocular lens: Secondary | ICD-10-CM | POA: Diagnosis not present

## 2019-05-06 DIAGNOSIS — I5022 Chronic systolic (congestive) heart failure: Secondary | ICD-10-CM

## 2019-05-06 DIAGNOSIS — H35033 Hypertensive retinopathy, bilateral: Secondary | ICD-10-CM | POA: Diagnosis not present

## 2019-05-06 DIAGNOSIS — E113293 Type 2 diabetes mellitus with mild nonproliferative diabetic retinopathy without macular edema, bilateral: Secondary | ICD-10-CM | POA: Diagnosis not present

## 2019-05-06 DIAGNOSIS — B309 Viral conjunctivitis, unspecified: Secondary | ICD-10-CM | POA: Diagnosis not present

## 2019-05-06 LAB — CUP PACEART REMOTE DEVICE CHECK
Battery Remaining Longevity: 16 mo
Battery Voltage: 2.9 V
Brady Statistic AP VP Percent: 19.82 %
Brady Statistic AP VS Percent: 0.01 %
Brady Statistic AS VP Percent: 80.13 %
Brady Statistic AS VS Percent: 0.04 %
Brady Statistic RA Percent Paced: 19.79 %
Brady Statistic RV Percent Paced: 99.75 %
Date Time Interrogation Session: 20200810041710
HighPow Impedance: 55 Ohm
Implantable Lead Implant Date: 20140402
Implantable Lead Implant Date: 20140402
Implantable Lead Implant Date: 20140402
Implantable Lead Location: 753858
Implantable Lead Location: 753859
Implantable Lead Location: 753860
Implantable Lead Model: 4194
Implantable Lead Model: 5076
Implantable Lead Model: 6935
Implantable Pulse Generator Implant Date: 20140402
Lead Channel Impedance Value: 228 Ohm
Lead Channel Impedance Value: 399 Ohm
Lead Channel Impedance Value: 418 Ohm
Lead Channel Impedance Value: 456 Ohm
Lead Channel Impedance Value: 475 Ohm
Lead Channel Impedance Value: 532 Ohm
Lead Channel Pacing Threshold Amplitude: 0.75 V
Lead Channel Pacing Threshold Amplitude: 0.875 V
Lead Channel Pacing Threshold Amplitude: 1 V
Lead Channel Pacing Threshold Pulse Width: 0.4 ms
Lead Channel Pacing Threshold Pulse Width: 0.4 ms
Lead Channel Pacing Threshold Pulse Width: 0.6 ms
Lead Channel Sensing Intrinsic Amplitude: 17 mV
Lead Channel Sensing Intrinsic Amplitude: 17 mV
Lead Channel Sensing Intrinsic Amplitude: 2.375 mV
Lead Channel Sensing Intrinsic Amplitude: 2.375 mV
Lead Channel Setting Pacing Amplitude: 1.5 V
Lead Channel Setting Pacing Amplitude: 2 V
Lead Channel Setting Pacing Amplitude: 2.5 V
Lead Channel Setting Pacing Pulse Width: 0.4 ms
Lead Channel Setting Pacing Pulse Width: 0.6 ms
Lead Channel Setting Sensing Sensitivity: 0.3 mV

## 2019-05-06 LAB — HM DIABETES EYE EXAM

## 2019-05-06 NOTE — Progress Notes (Signed)
EPIC Encounter for ICM Monitoring  Patient Name: [name] is a 85 y.o. male Date: 05/06/2019 Primary Care Physican: Axel Filler, MD Primary Cardiologist:Taylor Electrophysiologist: Santina Evans Pacing: 99.8% 03/03/2019 Weight:124lbs (office visit)  Attempted patient call and unable to reach.  Transmission reviewed.  OptivolThoracic impedance trending slightly below baselinenormal.   Prescribed:Furosemide 40 mg 1 tablet twice a day.   Labs: 05/06/2018 Creatinine 1.80, BUN 28, Potassium 4.4, Sodium 141, EGFR 34-39 02/18/2019Creatinine 2.13, BUN50, Potassium 4.8, Sodium 137, EGFR 28-32  Recommendations: None  Follow-up plan: ICM clinic phone appointment on10/08/2019.  Copy of ICM check sent to Dr.Taylor  3 month ICM trend: 05/05/2019    1 Year ICM trend:       [name], RN 05/06/2019 1:04 PM

## 2019-05-06 NOTE — Telephone Encounter (Signed)
Remote ICM transmission received.  Attempted call to patient regarding ICM remote transmission and no answer.  

## 2019-05-15 DIAGNOSIS — H0235 Blepharochalasis left lower eyelid: Secondary | ICD-10-CM | POA: Diagnosis not present

## 2019-05-15 DIAGNOSIS — H0234 Blepharochalasis left upper eyelid: Secondary | ICD-10-CM | POA: Diagnosis not present

## 2019-05-15 DIAGNOSIS — H0231 Blepharochalasis right upper eyelid: Secondary | ICD-10-CM | POA: Diagnosis not present

## 2019-05-15 DIAGNOSIS — H0232 Blepharochalasis right lower eyelid: Secondary | ICD-10-CM | POA: Diagnosis not present

## 2019-05-15 DIAGNOSIS — H04223 Epiphora due to insufficient drainage, bilateral lacrimal glands: Secondary | ICD-10-CM | POA: Diagnosis not present

## 2019-05-15 DIAGNOSIS — H04553 Acquired stenosis of bilateral nasolacrimal duct: Secondary | ICD-10-CM | POA: Diagnosis not present

## 2019-05-15 DIAGNOSIS — D487 Neoplasm of uncertain behavior of other specified sites: Secondary | ICD-10-CM | POA: Diagnosis not present

## 2019-05-16 NOTE — Progress Notes (Signed)
Remote ICD transmission.   

## 2019-05-19 DIAGNOSIS — E162 Hypoglycemia, unspecified: Secondary | ICD-10-CM | POA: Diagnosis not present

## 2019-05-19 DIAGNOSIS — R0902 Hypoxemia: Secondary | ICD-10-CM | POA: Diagnosis not present

## 2019-05-19 DIAGNOSIS — E161 Other hypoglycemia: Secondary | ICD-10-CM | POA: Diagnosis not present

## 2019-05-19 DIAGNOSIS — R4020 Unspecified coma: Secondary | ICD-10-CM | POA: Diagnosis not present

## 2019-05-19 DIAGNOSIS — R404 Transient alteration of awareness: Secondary | ICD-10-CM | POA: Diagnosis not present

## 2019-05-20 ENCOUNTER — Emergency Department (HOSPITAL_COMMUNITY): Payer: Medicare HMO

## 2019-05-20 ENCOUNTER — Other Ambulatory Visit: Payer: Self-pay

## 2019-05-20 ENCOUNTER — Inpatient Hospital Stay (HOSPITAL_COMMUNITY)
Admission: EM | Admit: 2019-05-20 | Discharge: 2019-05-27 | DRG: 637 | Disposition: A | Discharge status: Skilled Nursing Facility | Payer: Medicare HMO | Attending: Internal Medicine | Admitting: Internal Medicine

## 2019-05-20 DIAGNOSIS — I252 Old myocardial infarction: Secondary | ICD-10-CM

## 2019-05-20 DIAGNOSIS — G9349 Other encephalopathy: Secondary | ICD-10-CM | POA: Diagnosis present

## 2019-05-20 DIAGNOSIS — Z7982 Long term (current) use of aspirin: Secondary | ICD-10-CM

## 2019-05-20 DIAGNOSIS — M2550 Pain in unspecified joint: Secondary | ICD-10-CM | POA: Diagnosis not present

## 2019-05-20 DIAGNOSIS — E162 Hypoglycemia, unspecified: Secondary | ICD-10-CM | POA: Diagnosis present

## 2019-05-20 DIAGNOSIS — I350 Nonrheumatic aortic (valve) stenosis: Secondary | ICD-10-CM | POA: Diagnosis present

## 2019-05-20 DIAGNOSIS — Z955 Presence of coronary angioplasty implant and graft: Secondary | ICD-10-CM

## 2019-05-20 DIAGNOSIS — R4020 Unspecified coma: Secondary | ICD-10-CM | POA: Diagnosis not present

## 2019-05-20 DIAGNOSIS — E1122 Type 2 diabetes mellitus with diabetic chronic kidney disease: Secondary | ICD-10-CM | POA: Diagnosis not present

## 2019-05-20 DIAGNOSIS — N179 Acute kidney failure, unspecified: Secondary | ICD-10-CM | POA: Diagnosis present

## 2019-05-20 DIAGNOSIS — R4182 Altered mental status, unspecified: Secondary | ICD-10-CM | POA: Diagnosis not present

## 2019-05-20 DIAGNOSIS — E1139 Type 2 diabetes mellitus with other diabetic ophthalmic complication: Secondary | ICD-10-CM | POA: Diagnosis not present

## 2019-05-20 DIAGNOSIS — Z79899 Other long term (current) drug therapy: Secondary | ICD-10-CM

## 2019-05-20 DIAGNOSIS — Z86010 Personal history of colonic polyps: Secondary | ICD-10-CM

## 2019-05-20 DIAGNOSIS — N183 Chronic kidney disease, stage 3 unspecified: Secondary | ICD-10-CM | POA: Diagnosis present

## 2019-05-20 DIAGNOSIS — Z794 Long term (current) use of insulin: Secondary | ICD-10-CM

## 2019-05-20 DIAGNOSIS — Z95810 Presence of automatic (implantable) cardiac defibrillator: Secondary | ICD-10-CM | POA: Diagnosis not present

## 2019-05-20 DIAGNOSIS — Z87891 Personal history of nicotine dependence: Secondary | ICD-10-CM

## 2019-05-20 DIAGNOSIS — I2510 Atherosclerotic heart disease of native coronary artery without angina pectoris: Secondary | ICD-10-CM | POA: Diagnosis not present

## 2019-05-20 DIAGNOSIS — R339 Retention of urine, unspecified: Secondary | ICD-10-CM | POA: Diagnosis not present

## 2019-05-20 DIAGNOSIS — E161 Other hypoglycemia: Secondary | ICD-10-CM | POA: Diagnosis not present

## 2019-05-20 DIAGNOSIS — Z20828 Contact with and (suspected) exposure to other viral communicable diseases: Secondary | ICD-10-CM | POA: Diagnosis present

## 2019-05-20 DIAGNOSIS — G9340 Encephalopathy, unspecified: Secondary | ICD-10-CM | POA: Diagnosis present

## 2019-05-20 DIAGNOSIS — I454 Nonspecific intraventricular block: Secondary | ICD-10-CM | POA: Diagnosis present

## 2019-05-20 DIAGNOSIS — I130 Hypertensive heart and chronic kidney disease with heart failure and stage 1 through stage 4 chronic kidney disease, or unspecified chronic kidney disease: Secondary | ICD-10-CM | POA: Diagnosis not present

## 2019-05-20 DIAGNOSIS — Z03818 Encounter for observation for suspected exposure to other biological agents ruled out: Secondary | ICD-10-CM | POA: Diagnosis not present

## 2019-05-20 DIAGNOSIS — R634 Abnormal weight loss: Secondary | ICD-10-CM | POA: Diagnosis not present

## 2019-05-20 DIAGNOSIS — N401 Enlarged prostate with lower urinary tract symptoms: Secondary | ICD-10-CM | POA: Diagnosis not present

## 2019-05-20 DIAGNOSIS — E785 Hyperlipidemia, unspecified: Secondary | ICD-10-CM | POA: Diagnosis present

## 2019-05-20 DIAGNOSIS — Z833 Family history of diabetes mellitus: Secondary | ICD-10-CM

## 2019-05-20 DIAGNOSIS — E11649 Type 2 diabetes mellitus with hypoglycemia without coma: Secondary | ICD-10-CM | POA: Diagnosis not present

## 2019-05-20 DIAGNOSIS — R319 Hematuria, unspecified: Secondary | ICD-10-CM | POA: Diagnosis not present

## 2019-05-20 DIAGNOSIS — T8383XA Hemorrhage of genitourinary prosthetic devices, implants and grafts, initial encounter: Secondary | ICD-10-CM | POA: Diagnosis not present

## 2019-05-20 DIAGNOSIS — Z8673 Personal history of transient ischemic attack (TIA), and cerebral infarction without residual deficits: Secondary | ICD-10-CM

## 2019-05-20 DIAGNOSIS — Z7401 Bed confinement status: Secondary | ICD-10-CM | POA: Diagnosis not present

## 2019-05-20 DIAGNOSIS — Z681 Body mass index (BMI) 19 or less, adult: Secondary | ICD-10-CM | POA: Diagnosis not present

## 2019-05-20 DIAGNOSIS — Y846 Urinary catheterization as the cause of abnormal reaction of the patient, or of later complication, without mention of misadventure at the time of the procedure: Secondary | ICD-10-CM | POA: Diagnosis not present

## 2019-05-20 DIAGNOSIS — R918 Other nonspecific abnormal finding of lung field: Secondary | ICD-10-CM | POA: Diagnosis not present

## 2019-05-20 DIAGNOSIS — I5020 Unspecified systolic (congestive) heart failure: Secondary | ICD-10-CM | POA: Diagnosis not present

## 2019-05-20 DIAGNOSIS — I509 Heart failure, unspecified: Secondary | ICD-10-CM | POA: Diagnosis not present

## 2019-05-20 DIAGNOSIS — E11319 Type 2 diabetes mellitus with unspecified diabetic retinopathy without macular edema: Secondary | ICD-10-CM | POA: Diagnosis not present

## 2019-05-20 DIAGNOSIS — I255 Ischemic cardiomyopathy: Secondary | ICD-10-CM | POA: Diagnosis present

## 2019-05-20 DIAGNOSIS — I5022 Chronic systolic (congestive) heart failure: Secondary | ICD-10-CM | POA: Diagnosis not present

## 2019-05-20 DIAGNOSIS — E860 Dehydration: Secondary | ICD-10-CM | POA: Diagnosis present

## 2019-05-20 DIAGNOSIS — R310 Gross hematuria: Secondary | ICD-10-CM | POA: Diagnosis not present

## 2019-05-20 DIAGNOSIS — G9341 Metabolic encephalopathy: Secondary | ICD-10-CM | POA: Diagnosis not present

## 2019-05-20 DIAGNOSIS — T383X1A Poisoning by insulin and oral hypoglycemic [antidiabetic] drugs, accidental (unintentional), initial encounter: Secondary | ICD-10-CM | POA: Diagnosis present

## 2019-05-20 DIAGNOSIS — R9389 Abnormal findings on diagnostic imaging of other specified body structures: Secondary | ICD-10-CM

## 2019-05-20 DIAGNOSIS — E1151 Type 2 diabetes mellitus with diabetic peripheral angiopathy without gangrene: Secondary | ICD-10-CM | POA: Diagnosis present

## 2019-05-20 DIAGNOSIS — R52 Pain, unspecified: Secondary | ICD-10-CM | POA: Diagnosis not present

## 2019-05-20 DIAGNOSIS — N390 Urinary tract infection, site not specified: Secondary | ICD-10-CM | POA: Diagnosis not present

## 2019-05-20 DIAGNOSIS — E43 Unspecified severe protein-calorie malnutrition: Secondary | ICD-10-CM | POA: Diagnosis present

## 2019-05-20 DIAGNOSIS — N289 Disorder of kidney and ureter, unspecified: Secondary | ICD-10-CM

## 2019-05-20 DIAGNOSIS — K219 Gastro-esophageal reflux disease without esophagitis: Secondary | ICD-10-CM | POA: Diagnosis not present

## 2019-05-20 DIAGNOSIS — I10 Essential (primary) hypertension: Secondary | ICD-10-CM | POA: Diagnosis not present

## 2019-05-20 DIAGNOSIS — R338 Other retention of urine: Secondary | ICD-10-CM | POA: Diagnosis present

## 2019-05-20 DIAGNOSIS — M6281 Muscle weakness (generalized): Secondary | ICD-10-CM | POA: Diagnosis not present

## 2019-05-20 DIAGNOSIS — R404 Transient alteration of awareness: Secondary | ICD-10-CM | POA: Diagnosis not present

## 2019-05-20 LAB — CBG MONITORING, ED
Glucose-Capillary: 93 mg/dL (ref 70–99)
Glucose-Capillary: 43 mg/dL — CL (ref 70–99)
Glucose-Capillary: 92 mg/dL (ref 70–99)
Glucose-Capillary: 41 mg/dL — CL (ref 70–99)
Glucose-Capillary: 93 mg/dL (ref 70–99)
Glucose-Capillary: 81 mg/dL (ref 70–99)

## 2019-05-20 LAB — COMPREHENSIVE METABOLIC PANEL
ALT: 14 U/L (ref 0–44)
AST: 32 U/L (ref 15–41)
Albumin: 3.7 g/dL (ref 3.5–5.0)
Alkaline Phosphatase: 55 U/L (ref 38–126)
Anion gap: 13 (ref 5–15)
BUN: 39 mg/dL — ABNORMAL HIGH (ref 8–23)
CO2: 23 mmol/L (ref 22–32)
Calcium: 9.6 mg/dL (ref 8.9–10.3)
Chloride: 100 mmol/L (ref 98–111)
Creatinine, Ser: 2.52 mg/dL — ABNORMAL HIGH (ref 0.61–1.24)
GFR calc Af Amer: 26 mL/min — ABNORMAL LOW (ref >60)
GFR calc non Af Amer: 22 mL/min — ABNORMAL LOW (ref >60)
Glucose, Bld: 72 mg/dL (ref 70–99)
Potassium: 4.7 mmol/L (ref 3.5–5.1)
Sodium: 136 mmol/L (ref 135–145)
Total Bilirubin: 0.8 mg/dL (ref 0.3–1.2)
Total Protein: 7.7 g/dL (ref 6.5–8.1)

## 2019-05-20 LAB — POCT I-STAT 7, (LYTES, BLD GAS, ICA,H+H)
Acid-Base Excess: 3 mmol/L — ABNORMAL HIGH (ref 0.0–2.0)
Bicarbonate: 26.9 mmol/L (ref 20.0–28.0)
Calcium, Ion: 1.26 mmol/L (ref 1.15–1.40)
HCT: 44 % (ref 39.0–52.0)
Hemoglobin: 15 g/dL (ref 13.0–17.0)
O2 Saturation: 91 %
Potassium: 3.4 mmol/L — ABNORMAL LOW (ref 3.5–5.1)
Sodium: 136 mmol/L (ref 135–145)
TCO2: 28 mmol/L (ref 22–32)
pCO2 arterial: 38.3 mmHg (ref 32.0–48.0)
pH, Arterial: 7.454 — ABNORMAL HIGH (ref 7.350–7.450)
pO2, Arterial: 58 mmHg — ABNORMAL LOW (ref 83.0–108.0)

## 2019-05-20 LAB — URINALYSIS, ROUTINE W REFLEX MICROSCOPIC
Bacteria, UA: NONE SEEN
Bilirubin Urine: NEGATIVE
Glucose, UA: NEGATIVE mg/dL
Ketones, ur: NEGATIVE mg/dL
Leukocytes,Ua: NEGATIVE
Nitrite: NEGATIVE
Protein, ur: NEGATIVE mg/dL
RBC / HPF: >50 RBC/hpf — ABNORMAL HIGH (ref 0–5)
Specific Gravity, Urine: 1.008 (ref 1.005–1.030)
pH: 7 (ref 5.0–8.0)

## 2019-05-20 LAB — CBC WITH DIFFERENTIAL/PLATELET
Abs Immature Granulocytes: 0.03 10*3/uL (ref 0.00–0.07)
Basophils Absolute: 0 10*3/uL (ref 0.0–0.1)
Basophils Relative: 0 %
Eosinophils Absolute: 0 10*3/uL (ref 0.0–0.5)
Eosinophils Relative: 0 %
HCT: 47.9 % (ref 39.0–52.0)
Hemoglobin: 15.6 g/dL (ref 13.0–17.0)
Immature Granulocytes: 0 %
Lymphocytes Relative: 6 %
Lymphs Abs: 0.7 10*3/uL (ref 0.7–4.0)
MCH: 29.4 pg (ref 26.0–34.0)
MCHC: 32.6 g/dL (ref 30.0–36.0)
MCV: 90.4 fL (ref 80.0–100.0)
Monocytes Absolute: 0.5 10*3/uL (ref 0.1–1.0)
Monocytes Relative: 5 %
Neutro Abs: 9.2 10*3/uL — ABNORMAL HIGH (ref 1.7–7.7)
Neutrophils Relative %: 89 %
Platelets: 181 10*3/uL (ref 150–400)
RBC: 5.3 MIL/uL (ref 4.22–5.81)
RDW: 14.5 % (ref 11.5–15.5)
WBC: 10.4 10*3/uL (ref 4.0–10.5)
nRBC: 0 % (ref 0.0–0.2)

## 2019-05-20 MED ORDER — PROMETHAZINE HCL 25 MG PO TABS: 12.5000 mg | ORAL_TABLET | Freq: Four times a day (QID) | ORAL | Status: DC | PRN

## 2019-05-20 MED ORDER — DEXTROSE 5 % IV SOLN: INTRAVENOUS | Status: AC

## 2019-05-20 MED ORDER — SODIUM CHLORIDE 0.9 % IV SOLN: 1.0000 g | INTRAVENOUS | Status: DC

## 2019-05-20 MED ORDER — ATORVASTATIN CALCIUM 40 MG PO TABS
40.0000 mg | ORAL_TABLET | Freq: Every day | ORAL | Status: DC
  Administered 2019-05-21 – 2019-05-26 (×6): 40 mg via ORAL
  Filled 2019-05-20 (×6): qty 1

## 2019-05-20 MED ORDER — DEXTROSE 50 % IV SOLN
50.0000 mL | Freq: Once | INTRAVENOUS | Status: AC
  Administered 2019-05-20: 18:00:00 50 mL via INTRAVENOUS
  Filled 2019-05-20: qty 50

## 2019-05-20 MED ORDER — SENNOSIDES-DOCUSATE SODIUM 8.6-50 MG PO TABS: 1.0000 | ORAL_TABLET | Freq: Every evening | ORAL | Status: DC | PRN

## 2019-05-20 MED ORDER — ACETAMINOPHEN 325 MG PO TABS
650.0000 mg | ORAL_TABLET | Freq: Four times a day (QID) | ORAL | Status: DC | PRN
  Administered 2019-05-23 – 2019-05-26 (×4): 650 mg via ORAL
  Filled 2019-05-20 (×4): qty 2

## 2019-05-20 MED ORDER — SODIUM CHLORIDE 0.9 % IV SOLN
1.0000 g | Freq: Once | INTRAVENOUS | Status: AC
  Administered 2019-05-21: 1 g via INTRAVENOUS
  Filled 2019-05-20: qty 10

## 2019-05-20 MED ORDER — HEPARIN SODIUM (PORCINE) 5000 UNIT/ML IJ SOLN
5000.0000 [IU] | Freq: Three times a day (TID) | INTRAMUSCULAR | Status: DC
  Administered 2019-05-21 – 2019-05-25 (×13): 5000 [IU] via SUBCUTANEOUS
  Filled 2019-05-20 (×12): qty 1

## 2019-05-20 MED ORDER — ALBUTEROL SULFATE (2.5 MG/3ML) 0.083% IN NEBU: 3.0000 mL | INHALATION_SOLUTION | Freq: Four times a day (QID) | RESPIRATORY_TRACT | Status: DC | PRN

## 2019-05-20 MED ORDER — DEXTROSE 5 % IV SOLN
Freq: Once | INTRAVENOUS | Status: AC
  Administered 2019-05-20: 21:00:00 via INTRAVENOUS

## 2019-05-20 MED ORDER — ASPIRIN EC 81 MG PO TBEC
81.0000 mg | DELAYED_RELEASE_TABLET | Freq: Every day | ORAL | Status: DC
  Administered 2019-05-22 – 2019-05-27 (×6): 81 mg via ORAL
  Filled 2019-05-20 (×6): qty 1

## 2019-05-20 MED ORDER — TAMSULOSIN HCL 0.4 MG PO CAPS
0.8000 mg | ORAL_CAPSULE | Freq: Every day | ORAL | Status: DC
  Administered 2019-05-21 – 2019-05-26 (×6): 0.8 mg via ORAL
  Filled 2019-05-20 (×6): qty 2

## 2019-05-20 MED ORDER — PANTOPRAZOLE SODIUM 40 MG PO TBEC
40.0000 mg | DELAYED_RELEASE_TABLET | Freq: Every day | ORAL | Status: DC
  Administered 2019-05-22 – 2019-05-27 (×6): 40 mg via ORAL
  Filled 2019-05-20 (×6): qty 1

## 2019-05-20 MED ORDER — SODIUM CHLORIDE 0.9 % IV SOLN: 500.0000 mg | INTRAVENOUS | Status: DC

## 2019-05-20 MED ORDER — ACETAMINOPHEN 650 MG RE SUPP: 650.0000 mg | Freq: Four times a day (QID) | RECTAL | Status: DC | PRN

## 2019-05-20 MED ORDER — DEXTROSE 50 % IV SOLN
1.0000 | Freq: Once | INTRAVENOUS | Status: AC
  Administered 2019-05-20 – 2019-05-21 (×2): 50 mL via INTRAVENOUS
  Filled 2019-05-20: qty 50

## 2019-05-20 MED ORDER — SODIUM CHLORIDE 0.9 % IV SOLN
500.0000 mg | INTRAVENOUS | Status: DC
  Administered 2019-05-21: 03:00:00 500 mg via INTRAVENOUS
  Filled 2019-05-20: qty 500

## 2019-05-20 NOTE — ED Provider Notes (Addendum)
[place] EMERGENCY DEPARTMENT Provider Note   CSN: [id number] Arrival date & time: 05/20/19  1719     History   Chief Complaint Chief Complaint  Patient presents with  . Hypoglycemia    HPI [name] is a 85 y.o. male.     HPI   He presents for evaluation of low blood sugar.  He came by EMS.  He is unable to give any history.  Level 5 caveat-altered mental status    Past Medical History:  Diagnosis Date  . Adenomatous colon polyp 02/14/2012  . BBB (bundle branch block)    right  . Carotid stenosis    a. s/p L carotid stent 2004;  b. Carotid US (09/2014): Bilateral ICA 1-39%, left ECA >59%, normal subclavian bilaterally, occluded left vertebral >> FU 2 years  . Chronic kidney disease   . Chronic systolic CHF (congestive heart failure) (HCC)    a. ischemic CM EF 15-20%;  b. s/p AICD 05/24/04;  c. Echo 7/06: EF 30-40%, mild reduced RVSF, d. Echo 12/2015 EF 35-40%  . Elevated PSA   . HTN (hypertension)   . Hyperlipidemia   . ICD (implantable cardiac defibrillator) in place 12-25-2012   MDT CRTD upgrade by Dr Lovena Le  . Myocardial infarction ([place]) 1990  . Pericardial effusion    Echocardiogram (09/2014): EF 25% with distal anterior, distal inferior, distal lateral and apical akinesis, grade 1 diastolic dysfunction, very mild aortic stenosis (mean 7 mmHg) - this may be depressed due to low EF (2-D images suggest mild to moderate aortic stenosis), large pericardial effusion, no RA collapse  . PVD (peripheral vascular disease) ([place])    s/p L carotid PTCA/stent 2004  . Transient ischemic attack   . Type II diabetes mellitus [facility])     Patient Active Problem List   Diagnosis Date Noted  . Viral pharyngitis 03/03/2019  . Hypertensive retinopathy of both eyes 02/05/2018  . Mild nonproliferative diabetic retinopathy of both eyes without macular edema associated with type 2 diabetes mellitus ([place]) 02/05/2018  . Sensorineural hearing loss (SNHL),  bilateral 12/11/2017  . Dry eye syndrome of both eyes 06/06/2017  . Weight loss 12/11/2016  . Advanced care planning/counseling discussion 12/11/2016  . Urinary retention 01/13/2016  . Healthcare maintenance 11/22/2015  . [name] (chronic kidney disease) stage 3, GFR 30-59 ml/min (HCC) 12/04/2014  . Automatic implantable cardioverter-defibrillator in situ 01/16/2012  . Constipation 03/10/2009  . Coronary atherosclerosis 12/25/2007  . Chronic systolic congestive heart failure ([place]) 12/25/2007  . Hyperlipidemia 01/02/2007  . Type 2 diabetes mellitus with peripheral vascular disease ([place]) 01/01/1989    Past Surgical History:  Procedure Laterality Date  . BI-VENTRICULAR IMPLANTABLE CARDIOVERTER DEFIBRILLATOR UPGRADE N/A 12/25/2012   Procedure: BI-VENTRICULAR IMPLANTABLE CARDIOVERTER DEFIBRILLATOR UPGRADE;  Surgeon: Evans Lance, MD;  Location: [facility] CATH LAB;  Service: Cardiovascular;  Laterality: N/A;  . BIV ICD UPGRADE  12/25/2012   MDT CRTD upgrade by Dr Lovena Le for ischemic cardiomyopathy and worsening conduction system disease  . CARDIAC CATHETERIZATION  06/2003,  01/2004  . CARDIAC CATHETERIZATION N/A 01/13/2016   Procedure: Left Heart Cath and Coronary Angiography;  Surgeon: Jettie Booze, MD;  Location: [place] CV LAB;  Service: Cardiovascular;  Laterality: N/A;  . CARDIAC DEFIBRILLATOR PLACEMENT  05/24/2004   Implantation of a MDT single-chamber defibrillator  . CAROTID STENT  09/11/2003   Percutaneous transluminal angioplasty and stent placement of the left internal carotid artery.  Marland Kitchen CATARACT EXTRACTION W/ INTRAOCULAR LENS  IMPLANT, BILATERAL Bilateral ~  2010  . [place]   "2" (12/25/2012)  . INSERT / REPLACE / REMOVE PACEMAKER    . LEAD REVISION N/A 12/25/2012   Procedure: LEAD REVISION;  Surgeon: Evans Lance, MD;  Location: [facility] CATH LAB;  Service: Cardiovascular;  Laterality: N/A;        Home Medications    Prior to Admission  medications   Medication Sig Start Date End Date Taking? Authorizing Provider  alum & mag hydroxide-simeth (MAALOX/MYLANTA) 200-200-20 MG/5ML suspension Take 15 mLs by mouth every 6 (six) hours as needed for indigestion or heartburn. 03/27/19   Wieters, Hallie C, PA-C  aspirin EC 81 MG tablet Take 81 mg by mouth daily.    [provider]  atorvastatin (LIPITOR) 40 MG tablet TAKE 1 TABLET(40 MG) BY MOUTH DAILY AT 6 PM 03/03/19   Axel Filler, MD  Blood Glucose Monitoring Suppl (ACCU-CHEK AVIVA PLUS) w/Device KIT Check finger stick glucose once daily 05/06/18   Axel Filler, MD  carvedilol (COREG) 12.5 MG tablet Take 1 tablet (12.5 mg total) by mouth 2 (two) times daily with a meal. 03/05/19   Axel Filler, MD  fluorometholone (FML) 0.1 % ophthalmic suspension Place 2 drops into both eyes daily.  06/23/16   [provider]  furosemide (LASIX) 40 MG tablet Take 1 tablet (40 mg total) by mouth 2 (two) times daily. 04/14/19   Evans Lance, MD  glucose blood (ACCU-CHEK AVIVA PLUS) test strip Check blood sugar up to 3 times a day 08/06/17   Axel Filler, MD  Insulin Degludec-Liraglutide (XULTOPHY) 100-3.6 UNIT-MG/ML SOPN Inject 20 Units into the skin daily. 04/04/19   Axel Filler, MD  Insulin Pen Needle (B-D ULTRAFINE III SHORT PEN) 31G X 8 MM MISC USE ONCE DAILY WITH LANTUS PEN 05/06/18   Axel Filler, MD  ofloxacin (OCUFLOX) 0.3 % ophthalmic solution INT 1 GTT IN OU QID 04/30/18   [provider]  ondansetron (ZOFRAN-ODT) 4 MG disintegrating tablet Take 1 tablet (4 mg total) by mouth every 8 (eight) hours as needed for nausea or vomiting. 03/27/19   Wieters, Hallie C, PA-C  pantoprazole (PROTONIX) 40 MG tablet TAKE 1 TABLET(40 MG) BY MOUTH DAILY 03/11/19   Axel Filler, MD  polyethylene glycol [facility] / Floria Raveling) packet Take 17 g by mouth daily. 09/13/17   Jola Schmidt, MD  senna-docusate (SENOKOT-S) 8.6-50 MG tablet  Take 2 tablets by mouth daily. 05/04/16   Konrad Felix, PA  tamsulosin (FLOMAX) 0.4 MG CAPS capsule Take 0.8 mg by mouth daily after supper.    [provider]  VENTOLIN HFA 108 (90 Base) MCG/ACT inhaler INHALE 2 PUFFS INTO THE LUNGS EVERY 6 HOURS AS NEEDED FOR WHEEZING OR SHORTNESS OF BREATH 03/15/16   Axel Filler, MD    Family History Family History  Problem Relation Age of Onset  . Diabetes Mother   . Healthy Father   . Diabetes Brother   . Heart attack Neg Hx   . Stroke Neg Hx     Social History Social History   Tobacco Use  . Smoking status: Former Smoker    Types: Cigarettes    Quit date: 12/27/1967    Years since quitting: 51.4  . Smokeless tobacco: Never Used  Substance Use Topics  . Alcohol use: No    Alcohol/week: 0.0 standard drinks    Comment: 12/25/2012 "quit all alcohol 60 yr ago"  . Drug use: No  Allergies   Patient has no known allergies.   Review of Systems Review of Systems  Unable to perform ROS: Mental status change     Physical Exam Updated Vital Signs BP (!) 91/55   Pulse 94   Temp (!) 97.5 F (36.4 C) (Oral)   Resp (!) 25   SpO2 96%   Physical Exam Vitals signs and nursing note reviewed.  Constitutional:      General: He is in acute distress.     Appearance: He is well-developed. He is ill-appearing. He is not toxic-appearing or diaphoretic.     Comments: Elderly, frail, under nourished.  HENT:     Head: Normocephalic and atraumatic.     Right Ear: External ear normal.     Left Ear: External ear normal.  Eyes:     Conjunctiva/sclera: Conjunctivae normal.     Pupils: Pupils are equal, round, and reactive to light.  Neck:     Musculoskeletal: Normal range of motion and neck supple.     Trachea: Phonation normal.  Cardiovascular:     Rate and Rhythm: Regular rhythm. Tachycardia present.     Heart sounds: Normal heart sounds.  Pulmonary:     Effort: Pulmonary effort is normal. No respiratory distress.      Breath sounds: Normal breath sounds. No stridor.  Abdominal:     General: There is distension.     Palpations: Abdomen is soft. There is mass (Start with urinary bladder swelling.).     Tenderness: There is abdominal tenderness. There is no rebound.     Hernia: No hernia is present.  Musculoskeletal: Normal range of motion.  Skin:    General: Skin is warm and dry.  Neurological:     Mental Status: He is alert.     Cranial Nerves: No cranial nerve deficit.     Sensory: No sensory deficit.     Motor: No abnormal muscle tone.     Coordination: Coordination normal.     Comments: Alert and responsive but not cooperative.  He is moaning continually.  Psychiatric:     Comments: Agitated      ED Treatments / Results  Labs (all labs ordered are listed, but only abnormal results are displayed) Labs Reviewed  CBC WITH DIFFERENTIAL/PLATELET - Abnormal; Notable for the following components:      Result Value   Neutro Abs 9.2 (*)    All other components within normal limits  URINALYSIS, ROUTINE W REFLEX MICROSCOPIC - Abnormal; Notable for the following components:   APPearance HAZY (*)    Hgb urine dipstick LARGE (*)    RBC / HPF >50 (*)    All other components within normal limits  COMPREHENSIVE METABOLIC PANEL - Abnormal; Notable for the following components:   BUN 39 (*)    Creatinine, Ser 2.52 (*)    GFR calc non Af Amer 22 (*)    GFR calc Af Amer 26 (*)    All other components within normal limits  CBG MONITORING, ED - Abnormal; Notable for the following components:   Glucose-Capillary 43 (*)    All other components within normal limits  CBG MONITORING, ED - Abnormal; Notable for the following components:   Glucose-Capillary 41 (*)    All other components within normal limits  SARS CORONAVIRUS 2 (TAT 6-12 HRS)  BLOOD GAS, VENOUS  CBG MONITORING, ED  CBG MONITORING, ED    EKG None  Radiology Dg Chest Port 1 View  Result Date: 05/20/2019 CLINICAL DATA:  Confusion.  EXAM: PORTABLE CHEST 1 VIEW COMPARISON:  Radiographs of Jan 27, 2016. FINDINGS: The heart size and mediastinal contours are within normal limits. Left-sided pacemaker is unchanged in position. No pneumothorax or pleural effusion is noted. Left midlung and right basilar opacities are noted concerning for possible pneumonia. The visualized skeletal structures are unremarkable. IMPRESSION: Bilateral lung opacities as described above, concerning for pneumonia. Followup PA and lateral chest X-ray is recommended in 3-4 weeks following trial of antibiotic therapy to ensure resolution and exclude underlying malignancy. Electronically Signed   By: Marijo Conception M.D.   On: 05/20/2019 19:14    Procedures .Critical Care Performed by: Daleen Bo, MD Authorized by: Daleen Bo, MD   Critical care provider statement:    Critical care time (minutes):  35   Critical care start time:  05/20/2019 7:15 PM   Critical care end time:  05/20/2019 9:00 PM   Critical care time was exclusive of:  Separately billable procedures and treating other patients   Critical care was necessary to treat or prevent imminent or life-threatening deterioration of the following conditions:  Metabolic crisis   Critical care was time spent personally by me on the following activities:  Blood draw for specimens, development of treatment plan with patient or surrogate, discussions with consultants, evaluation of patient's response to treatment, examination of patient, obtaining history from patient or surrogate, ordering and performing treatments and interventions, ordering and review of laboratory studies, pulse oximetry, re-evaluation of patient's condition, review of old charts and ordering and review of radiographic studies   (including critical care time)  Medications Ordered in ED Medications  dextrose 50 % solution 50 mL (50 mLs Intravenous Given 05/20/19 1828)  dextrose 50 % solution 50 mL (50 mLs Intravenous Given 05/20/19 2049)   dextrose 5 % solution ( Intravenous New Bag/Given 05/20/19 2047)     Initial Impression / Assessment and Plan / ED Course  I have reviewed the triage vital signs and the nursing notes.  Pertinent labs & imaging results that were available during my care of the patient were reviewed by me and considered in my medical decision making (see chart for details).  Clinical Course as of May 19 2100  Tue May 20, 2019  1924 Bladder scan shows 926 cc of urine in the urinary bladder.  Foley catheter ordered.   [EW]  1925 Normal  CBC with Differential(!) [EW]  1925 Low, D50 given  POC CBG, ED(!!) [EW]  2004 I had a discussion with the patient sister who knows him well, but has not seen him in a few days.  Patient lives independently, manages own medicines and food, and usually does not well.  He apparently has a girlfriend that he see some times, the sister thinks she may have been with him and called the ambulance today.  Patient told his sister yesterday that he took 34 units of his insulin at bedtime the night before, and his sister thinks he may have taken another 34 units today.  His prescribed dose is 14 units.  Therefore he is taking too much insulin.  The sister feels like the patient is sometimes confused and may have inadvertently overdosed on insulin.  She does think he has been losing weight because not eating well for the last 6 months.  He is not sure when he last saw his physician.   [EW]  2023 Patient continues to be altered, and essentially nonverbal and not cooperative.  No recent CBG at 1937 was normal.  Will  repeat and pain CT imaging head to evaluate for intracranial abnormalities.   [EW]  2043 He is hypoglycemic again, will start D5 half-normal saline at 125 mL/h.   [EW]  2059 Possible infiltrates, CHF, image reviewed by me  DG Chest [facility] [EW]    Clinical Course User Index [EW] Daleen Bo, MD       Patient Vitals for the past 24 hrs:  BP Temp Temp src Pulse Resp  SpO2  05/20/19 2045 (!) 91/55 - - 94 (!) 25 96 %  05/20/19 2015 95/63 - - 92 17 95 %  05/20/19 1945 105/77 - - 94 (!) 31 95 %  05/20/19 1915 (!) 91/58 - - 85 (!) 24 92 %  05/20/19 1845 (!) 107/59 - - 95 18 98 %  05/20/19 1830 105/73 - - 83 (!) 24 (!) 86 %  05/20/19 1815 (!) 84/55 - - 81 (!) 24 91 %  05/20/19 1800 (!) 85/53 - - 84 (!) 30 92 %  05/20/19 1745 (!) 163/68 - - 85 (!) 21 93 %  05/20/19 1730 132/74 - - 92 (!) 29 (!) 86 %  05/20/19 1728 129/77 (!) 97.5 F (36.4 C) Oral 91 - (!) 88 %     Medical Decision Making: Hypoglycemia, recurrent, likely related to inappropriate insulin dosing.  Urinary retention, nonspecific, improved with Foley catheterization.  Patient continues to be hypoglycemic and likely has been inappropriately taking too much insulin.  CT imaging ordered to evaluate for intracranial abnormality.  IV glucose support given multiple doses and drip.  Arrangements made for admission.  CRITICAL CARE- yes Performed by: Daleen Bo   Nursing Notes Reviewed/ Care Coordinated Applicable Imaging Reviewed Interpretation of Laboratory Data incorporated into ED treatment   8:45 PM-Consult complete with internal medicine resident. Patient case explained and discussed.  She agrees to admit patient for further evaluation and treatment. Call ended at 8:59 PM  Plan: Admit   Final Clinical Impressions(s) / ED Diagnoses   Final diagnoses:  Hypoglycemia  Urinary retention  Altered mental status, unspecified altered mental status type  Renal insufficiency  Abnormal chest x-ray    ED Discharge Orders    None       Daleen Bo, MD 05/20/19 2101    Daleen Bo, MD 05/27/19 1049

## 2019-05-20 NOTE — H&P (Addendum)
Date: 05/21/2019               Patient Name:  [name] MRN: [id number]  DOB: 1931/04/26 Age / Sex: 85 y.o., male   PCP: Axel Filler, MD         Medical Service: Internal Medicine Teaching Service         Attending Physician: Dr. Rebeca Alert Raynaldo Opitz, MD    First Contact: Dr. Ronnald Ramp Pager: [phone]  Second Contact: Dr. Shan Levans Pager: [phone]        After Hours (After 5p/  First Contact Pager: [phone]  weekends / holidays): Second Contact Pager: [phone]   Chief Complaint: AMS/hypoglycemia  History of Present Illness: This is a 85 year old male with a past medical history significant for coronary artery disease, hyperlipidemia, chronic systolic heart failure (status post ICD placement), type 2 diabetes with associated retinopathy, [name] 3, urinary retention.  Patient's sister is primary historian. Patient arrived to the ED via EMS due to hypoglycemia.  Upon arrival to ED, patient was unresponsive.  Initial CBG 40.  Per EMS, is typically verbal and alert.  Patient received amp of D 50 in ED.  This did temporarily normalized the glucose however patient became hypoglycemic again and required initiation of D5 half normal saline to maintain appropriate glucose levels. Patient was bladder scanned upon arrival as well, and was found to have bladder volume of 926 mL.  Foley was placed for that. ED notes indicate that provider spoke to patient's sister reports that the patient lives independently and does not require any assistance with ADLs.  According to these notes, patient did verbalize to his sister that he took 34 units of insulin at bedtime on day prior to admission as well as 34 units on day of admission.  Patient is only supposed to be taking 14 units once a day.  The sister also does endorse concerns about occasional confusion and the patient.  She also notes that he has been losing some weight and had poor appetite over the past 6 months.  Meds:  No outpatient  medications have been marked as taking for the 05/20/19 encounter [facility] Encounter).  coreg 12.5mg  bid Lasix 40mg  bid Insulin degludec-liraglutide 20U daily lipitor 40mg  daily flomax 0.8mg  daily Ventolin HFA 108 (90 base) mcg/act inhaler 2 puffs q6h prn   Allergies: Allergies as of 05/20/2019  . (No Known Allergies)   Past Medical History:  Diagnosis Date  . Adenomatous colon polyp 02/14/2012  . BBB (bundle branch block)    right  . Carotid stenosis    a. s/p L carotid stent 2004;  b. Carotid US (09/2014): Bilateral ICA 1-39%, left ECA >59%, normal subclavian bilaterally, occluded left vertebral >> FU 2 years  . Chronic kidney disease   . Chronic systolic CHF (congestive heart failure) (HCC)    a. ischemic CM EF 15-20%;  b. s/p AICD 05/24/04;  c. Echo 7/06: EF 30-40%, mild reduced RVSF, d. Echo 12/2015 EF 35-40%  . Elevated PSA   . HTN (hypertension)   . Hyperlipidemia   . ICD (implantable cardiac defibrillator) in place 12-25-2012   MDT CRTD upgrade by Dr Lovena Le  . Myocardial infarction ([place]) 1990  . Pericardial effusion    Echocardiogram (09/2014): EF 25% with distal anterior, distal inferior, distal lateral and apical akinesis, grade 1 diastolic dysfunction, very mild aortic stenosis (mean 7 mmHg) - this may be depressed due to low EF (2-D images suggest mild to moderate aortic stenosis), large pericardial effusion,  no RA collapse  . PVD (peripheral vascular disease) ([place])    s/p L carotid PTCA/stent 2004  . Transient ischemic attack   . Type II diabetes mellitus (HCC)     Family History:  Family History  Problem Relation Age of Onset  . Diabetes Mother   . Healthy Father   . Diabetes Brother   . Heart attack Neg Hx   . Stroke Neg Hx      Social History: resides at home independently per patient's sister   Review of Systems: Unable to be obtained due to patient's encephalopathy  Physical Exam: Blood pressure (!) 95/56, pulse 96, temperature 98.1 F (36.7 C),  temperature source Oral, resp. rate 16, weight 53.2 kg, SpO2 94 %.  GENERAL: chronically ill appearing. cachetic appearing. Poor hygeine.  HEENT: head atraumatic. No conjunctival injection. Nares patent. Tongue appears dry. CARDIAC: heart RRR. No peripheral edema. No JVD appreciated PULMONARY: acyanotic. Scattered rhonchi throughout. No wheezes. Weaned O2 to 2L with O2 sats maintaining >94% ABDOMEN: Nondistended.  NEURO: arousable to voice. Does not follow commands. PERRL.  EOMs appear intact. No facial asymmetry appreciated. Corneal reflex intact. Tongue appears midline. Moving all 4 extremities.  SKIN: skin tear to left elbow. Scar above pacemaker PSYCH: encephalopathic   CXR: personally reviewed my interpretation is opacities in left middle lobe and right base that could be consistent with pneumonia.  Formal read suggests follow-up after opacities clear up to rule out mass  CT head: mild to moderate age related atrophy and chronic microvascular ischemic changes. Probable small right frontal meningioma that is slightly increased in size when compared to prior CT.   Assessment & Plan by Problem: Active Problems:   Type 2 diabetes mellitus with peripheral vascular disease (HCC)   Chronic systolic congestive heart failure (HCC)   Urinary retention   Acute encephalopathy  Altered mental status. Likely attributable to hypoglycemia however still unclear.  Appears the patient was hypoglycemic when he presented to the ED-- blood sugar 43.  He was given an amp of D50 blood sugar was rechecked however was still low so another amp of D50 was given.  Blood sugar initially responded however he is continuing to struggle with hypoglycemia so D5 was started in the ED.   Sister did express some concerns that patient may be struggling with some memory issues so further assessment of the altered mental status will be appropriate once hypoglycemia is corrected. -CBC without evidence of leukocytosis -Chest  x-ray with findings that could be consistent with pneumonia.  Patient did have diffuse scattered rhonchi.    - CT head neg for acute changes. Right frontal meningioma slightly increased in size from prior imaging however unlikely to account for current state Plan: -will continue rocephin/azithromycin empirically for lung opacities. Given lack of leukocytosis or fever, pneumonia is questionable. Will repeat CBC in AM to evaluate for presence of leukocytosis. -UDS -TSH -B12, folate   Hypoglycemia/Type 2 diabetes.  A1c 8.1 3 months ago. Appears that patient was previously on degludec-liraglutide 34U daily however this was recently decreased to 20U daily. Unable to ascertain with any certainty how much patient has been taking due to his encephalopathic state. Hypoglycemia 2/2 medication overdose vs malnutrition -Should assure patient understands medication dosing prior to d/c -Will hold anti-hyperglycemic agents -hourly glucose checks -D5NS @125mL /hr  Urinary retention/hematuria.  Bladder scan in ED revealed 926 mL.  UA significant for large amount of blood. No nitrites or bacteria. Hgb stable. No clots visible in foley bag. Etiology still  unclear. History of bladder outlet obstruction in 2017 requiring foley. Was believed to be secondary to BPH at that time. Unclear if patient has been taking prescribed flomax. Hematuria 2/2 trauma vs BPH vs malignancy vs stool burden. Does have significant smoking history. Upward trending PSA--10.5 in 2019. Also has history of constipation which, when significant, can cause a temporary outlet obstruction.  -avoid anticholinergics -will need voiding trials with bladder scans once more coherent. May need to consider urology ref or further imaging.  Acute on Chronic kidney disease stage III.  GFR 22.  Creatinine 2.52. Difficult to ascertain baseline but was 1.8 last August. No proteinuria on today's UA. Slight decline in renal function likely attributable to urinary  retention. Will continue to evaluate. -avoid nephrotoxic agents  Coronary artery disease: 3 vessel CAD s/p stent placement in mid circumflex 2004 s/p drug eluding stent placement 2005. Cath 2017: 40% stenosis of circumflex, LAD occluded chronically with collaterals present, 50% stenosis of mid RCA.   Systolic heart failure s/p ICD placement 2013. EF 30-35% with mild stenosis of aortic valve on last echo in 2017. Does not appear to be fluid up today. Will have to monitor closely for s/s of fluid overload as he is requiring dextrose IVF  Hyperlipidemia. Continue lipitor 40mg  daily once able to take po meds Hypertension: home medications- coreg 12.5mg  bid, lasix 40mg  bid. Hold antihypertensives until blood pressure stablizes.  Weight loss.. Weight 128 in 2018-->131(2019)-->124 (02/2019). Malnutrition vs malignant process. Previously worked up for this by PCP. Last colonoscopy 2010--28mm tubular adenoma. CT noncontrast abdomen in 2018 did note some possible rectal wall thickening however was difficult to visualize due to lack of contrast.  Consider nutrition consult.  CODE STATUS: FULL Diet: npo IV fluids: D5NS @75mL /hr GI prophylaxis: protonix. Phenergan for nausea. DVT for prophylaxis: heparin 5000U q8h ABX: rocephin + azithromycin    Dispo: Admit patient to Inpatient with expected length of stay greater than 2 midnights.  Signed: Mitzi Hansen, MD 05/21/2019, 2:43 AM  Pager: [phone]

## 2019-05-20 NOTE — Progress Notes (Signed)
Patient arrived to unit at approx. 2345.   Spoke with pharmacy at [place] to confirm ok to give Rocephin IVPB, override warning appeared when med scanned. Pharmacy stated ok to give.  Continuous pulse ox initiated at 0135.   paged IM at [place]. Spoke with MD Darrick Meigs and informed CBG 57 and no current orders for POCT for CBG. Also informed, D5 scheduled to be discontinued at 0400. Expressed concern about need for closer monitoring of CBG given hypoglycemic episodes and running continuous D5 infusion. Will follow up with new orders. Will continue to monitor.  47mL D50 given at 0148 per standing orders for hypoglycemia. Immediately prior to administering D50 patient aroused with touch and able to follow some commands. Patient shook hand and squeezed RN finger upon command and when asked to shake head yes with RN demonstration, patient shook head yes. Patient also winked a number of times as if to communicate understanding when asked questions. Patient did not offer any verbal responses when asked questions.  Observed only approx 50 mL urine amber in foley bag at 0415. Blood sediment at bottom.  Aroused patient and able to follow command to shake hand, squeeze fingers and stick out tongue then returned to sleep. Paged Int Med at [phone] informed of concerns about minimal urine output. Foley flush ordered, suggested revision escalating level of care to progressive care and continuing D5.   Paged Int Med at [phone]. Callback at 0617 informed CBG 83 with 125 D5 and not yet adjusted to new order of 75 ml/hr. Informed to maintain 125 ml/hr. Informed awaiting supplies to irrigate foley. Patient able to follow commands shake hand, stick out tongue and squeeze, opens eyes spontaneously and with RN voice. Informed approx 149mL in foley bag.   [phone] patient stated name and birthday upon inquiry. Able to nod gesture appropriately about pain. Difficulty stating words when asked.   Paged Int Med Darrick Meigs at [phone], informed  patient now able to state name & birthday, alert. Mild coarse expiratory crackles in bilateral upper lobes.

## 2019-05-20 NOTE — ED Triage Notes (Signed)
Came in via EMS; from home; EMS reported 2nd incident today of low blood sugar. Pt was unresponsive upon EMS arrival w/ spontaneous breathing; initial glucose of 40 and was given D10. Per ems; pt is usually verbal. Pt is noted to have uncomprehensible / gurgly voice.

## 2019-05-20 NOTE — ED Notes (Signed)
Attempted report 

## 2019-05-21 ENCOUNTER — Inpatient Hospital Stay (HOSPITAL_COMMUNITY): Payer: Medicare HMO

## 2019-05-21 DIAGNOSIS — E11649 Type 2 diabetes mellitus with hypoglycemia without coma: Secondary | ICD-10-CM | POA: Diagnosis present

## 2019-05-21 DIAGNOSIS — N401 Enlarged prostate with lower urinary tract symptoms: Secondary | ICD-10-CM

## 2019-05-21 DIAGNOSIS — I5022 Chronic systolic (congestive) heart failure: Secondary | ICD-10-CM | POA: Diagnosis present

## 2019-05-21 DIAGNOSIS — E1122 Type 2 diabetes mellitus with diabetic chronic kidney disease: Secondary | ICD-10-CM | POA: Diagnosis present

## 2019-05-21 DIAGNOSIS — E43 Unspecified severe protein-calorie malnutrition: Secondary | ICD-10-CM | POA: Diagnosis present

## 2019-05-21 DIAGNOSIS — I350 Nonrheumatic aortic (valve) stenosis: Secondary | ICD-10-CM | POA: Diagnosis present

## 2019-05-21 DIAGNOSIS — R338 Other retention of urine: Secondary | ICD-10-CM | POA: Diagnosis present

## 2019-05-21 DIAGNOSIS — G9349 Other encephalopathy: Secondary | ICD-10-CM | POA: Diagnosis present

## 2019-05-21 DIAGNOSIS — R4182 Altered mental status, unspecified: Secondary | ICD-10-CM | POA: Diagnosis present

## 2019-05-21 DIAGNOSIS — T383X1A Poisoning by insulin and oral hypoglycemic [antidiabetic] drugs, accidental (unintentional), initial encounter: Secondary | ICD-10-CM | POA: Diagnosis present

## 2019-05-21 DIAGNOSIS — I2510 Atherosclerotic heart disease of native coronary artery without angina pectoris: Secondary | ICD-10-CM | POA: Diagnosis present

## 2019-05-21 DIAGNOSIS — E860 Dehydration: Secondary | ICD-10-CM | POA: Diagnosis present

## 2019-05-21 DIAGNOSIS — E785 Hyperlipidemia, unspecified: Secondary | ICD-10-CM | POA: Diagnosis present

## 2019-05-21 DIAGNOSIS — I130 Hypertensive heart and chronic kidney disease with heart failure and stage 1 through stage 4 chronic kidney disease, or unspecified chronic kidney disease: Secondary | ICD-10-CM | POA: Diagnosis present

## 2019-05-21 DIAGNOSIS — Y846 Urinary catheterization as the cause of abnormal reaction of the patient, or of later complication, without mention of misadventure at the time of the procedure: Secondary | ICD-10-CM | POA: Diagnosis not present

## 2019-05-21 DIAGNOSIS — I255 Ischemic cardiomyopathy: Secondary | ICD-10-CM | POA: Diagnosis present

## 2019-05-21 DIAGNOSIS — G9341 Metabolic encephalopathy: Secondary | ICD-10-CM

## 2019-05-21 DIAGNOSIS — R634 Abnormal weight loss: Secondary | ICD-10-CM | POA: Diagnosis not present

## 2019-05-21 DIAGNOSIS — Z95810 Presence of automatic (implantable) cardiac defibrillator: Secondary | ICD-10-CM | POA: Diagnosis not present

## 2019-05-21 DIAGNOSIS — R339 Retention of urine, unspecified: Secondary | ICD-10-CM | POA: Diagnosis not present

## 2019-05-21 DIAGNOSIS — N179 Acute kidney failure, unspecified: Secondary | ICD-10-CM | POA: Diagnosis present

## 2019-05-21 DIAGNOSIS — R310 Gross hematuria: Secondary | ICD-10-CM | POA: Diagnosis not present

## 2019-05-21 DIAGNOSIS — T8383XA Hemorrhage of genitourinary prosthetic devices, implants and grafts, initial encounter: Secondary | ICD-10-CM | POA: Diagnosis not present

## 2019-05-21 DIAGNOSIS — N183 Chronic kidney disease, stage 3 (moderate): Secondary | ICD-10-CM | POA: Diagnosis present

## 2019-05-21 DIAGNOSIS — Z681 Body mass index (BMI) 19 or less, adult: Secondary | ICD-10-CM | POA: Diagnosis not present

## 2019-05-21 DIAGNOSIS — I454 Nonspecific intraventricular block: Secondary | ICD-10-CM | POA: Diagnosis present

## 2019-05-21 DIAGNOSIS — E11319 Type 2 diabetes mellitus with unspecified diabetic retinopathy without macular edema: Secondary | ICD-10-CM | POA: Diagnosis present

## 2019-05-21 DIAGNOSIS — E1151 Type 2 diabetes mellitus with diabetic peripheral angiopathy without gangrene: Secondary | ICD-10-CM | POA: Diagnosis present

## 2019-05-21 DIAGNOSIS — Z20828 Contact with and (suspected) exposure to other viral communicable diseases: Secondary | ICD-10-CM | POA: Diagnosis present

## 2019-05-21 DIAGNOSIS — R319 Hematuria, unspecified: Secondary | ICD-10-CM

## 2019-05-21 LAB — URINALYSIS, ROUTINE W REFLEX MICROSCOPIC
Bilirubin Urine: NEGATIVE
Glucose, UA: NEGATIVE mg/dL
Ketones, ur: NEGATIVE mg/dL
Nitrite: NEGATIVE
Protein, ur: 100 mg/dL — AB
RBC / HPF: >50 RBC/hpf — ABNORMAL HIGH (ref 0–5)
Specific Gravity, Urine: 1.013 (ref 1.005–1.030)
WBC, UA: >50 WBC/hpf — ABNORMAL HIGH (ref 0–5)
pH: 5 (ref 5.0–8.0)

## 2019-05-21 LAB — GLUCOSE, CAPILLARY
Glucose-Capillary: 111 mg/dL — ABNORMAL HIGH (ref 70–99)
Glucose-Capillary: 57 mg/dL — ABNORMAL LOW (ref 70–99)
Glucose-Capillary: 122 mg/dL — ABNORMAL HIGH (ref 70–99)
Glucose-Capillary: 110 mg/dL — ABNORMAL HIGH (ref 70–99)
Glucose-Capillary: 83 mg/dL (ref 70–99)
Glucose-Capillary: 83 mg/dL (ref 70–99)
Glucose-Capillary: 75 mg/dL (ref 70–99)
Glucose-Capillary: 152 mg/dL — ABNORMAL HIGH (ref 70–99)
Glucose-Capillary: 59 mg/dL — ABNORMAL LOW (ref 70–99)
Glucose-Capillary: 130 mg/dL — ABNORMAL HIGH (ref 70–99)
Glucose-Capillary: 120 mg/dL — ABNORMAL HIGH (ref 70–99)
Glucose-Capillary: 107 mg/dL — ABNORMAL HIGH (ref 70–99)
Glucose-Capillary: 117 mg/dL — ABNORMAL HIGH (ref 70–99)
Glucose-Capillary: 131 mg/dL — ABNORMAL HIGH (ref 70–99)
Glucose-Capillary: 150 mg/dL — ABNORMAL HIGH (ref 70–99)
Glucose-Capillary: 236 mg/dL — ABNORMAL HIGH (ref 70–99)
Glucose-Capillary: 262 mg/dL — ABNORMAL HIGH (ref 70–99)
Glucose-Capillary: 222 mg/dL — ABNORMAL HIGH (ref 70–99)

## 2019-05-21 LAB — BASIC METABOLIC PANEL
Anion gap: 13 (ref 5–15)
BUN: 41 mg/dL — ABNORMAL HIGH (ref 8–23)
CO2: 25 mmol/L (ref 22–32)
Calcium: 9.1 mg/dL (ref 8.9–10.3)
Chloride: 98 mmol/L (ref 98–111)
Creatinine, Ser: 2.26 mg/dL — ABNORMAL HIGH (ref 0.61–1.24)
GFR calc Af Amer: 29 mL/min — ABNORMAL LOW (ref >60)
GFR calc non Af Amer: 25 mL/min — ABNORMAL LOW (ref >60)
Glucose, Bld: 76 mg/dL (ref 70–99)
Potassium: 3.9 mmol/L (ref 3.5–5.1)
Sodium: 136 mmol/L (ref 135–145)

## 2019-05-21 LAB — CBC
HCT: 42.2 % (ref 39.0–52.0)
Hemoglobin: 13.9 g/dL (ref 13.0–17.0)
MCH: 29.6 pg (ref 26.0–34.0)
MCHC: 32.9 g/dL (ref 30.0–36.0)
MCV: 90 fL (ref 80.0–100.0)
Platelets: 164 10*3/uL (ref 150–400)
RBC: 4.69 MIL/uL (ref 4.22–5.81)
RDW: 14.6 % (ref 11.5–15.5)
WBC: 15 10*3/uL — ABNORMAL HIGH (ref 4.0–10.5)
nRBC: 0 % (ref 0.0–0.2)

## 2019-05-21 LAB — MRSA PCR SCREENING: MRSA by PCR: NEGATIVE

## 2019-05-21 LAB — SARS CORONAVIRUS 2 (TAT 6-24 HRS): SARS Coronavirus 2: NEGATIVE

## 2019-05-21 LAB — TSH: TSH: 0.986 u[IU]/mL (ref 0.350–4.500)

## 2019-05-21 LAB — VITAMIN B12: Vitamin B-12: 329 pg/mL (ref 180–914)

## 2019-05-21 MED ORDER — RESOURCE THICKENUP CLEAR PO POWD
ORAL | Status: DC | PRN
  Filled 2019-05-21: qty 125

## 2019-05-21 MED ORDER — DEXTROSE 50 % IV SOLN
INTRAVENOUS | Status: AC
  Administered 2019-05-21: 50 mL via INTRAVENOUS
  Filled 2019-05-21: qty 50

## 2019-05-21 MED ORDER — DEXTROSE 10 % IV SOLN
INTRAVENOUS | Status: DC
  Administered 2019-05-21: 600 mL via INTRAVENOUS

## 2019-05-21 MED ORDER — DEXTROSE 50 % IV SOLN
INTRAVENOUS | Status: AC
  Filled 2019-05-21: qty 50

## 2019-05-21 MED ORDER — ADULT MULTIVITAMIN W/MINERALS CH
1.0000 | ORAL_TABLET | Freq: Every day | ORAL | Status: DC
  Administered 2019-05-21 – 2019-05-27 (×7): 1 via ORAL
  Filled 2019-05-21 (×7): qty 1

## 2019-05-21 MED ORDER — DEXTROSE 5 % IV SOLN: INTRAVENOUS | Status: DC

## 2019-05-21 MED ORDER — DEXTROSE 50 % IV SOLN
1.0000 | Freq: Once | INTRAVENOUS | Status: AC
  Administered 2019-05-21: 02:00:00 50 mL via INTRAVENOUS

## 2019-05-21 MED ORDER — ENSURE ENLIVE PO LIQD
237.0000 mL | Freq: Three times a day (TID) | ORAL | Status: DC
  Administered 2019-05-21 – 2019-05-27 (×14): 237 mL via ORAL

## 2019-05-21 MED ORDER — SODIUM CHLORIDE 0.9 % IV SOLN
INTRAVENOUS | Status: DC | PRN
  Administered 2019-05-21: 250 mL via INTRAVENOUS

## 2019-05-21 MED ORDER — DEXTROSE 10 % IV SOLN
INTRAVENOUS | Status: AC
  Administered 2019-05-21: 1000 mL via INTRAVENOUS

## 2019-05-21 MED ORDER — DEXTROSE 50 % IV SOLN: 50.0000 mL | Freq: Once | INTRAVENOUS | Status: DC

## 2019-05-21 NOTE — Progress Notes (Signed)
   05/21/19 0800  SLP Visit Information  SLP Received On 05/21/19  General Information  HPI This is a 85 year old male with a past medical history significant for coronary artery disease, hyperlipidemia, chronic systolic heart failure (status post ICD placement), type 2 diabetes with associated retinopathy, [name] 3, urinary retention. Patient arrived to the ED via EMS due to hypoglycemia.  CXR opacities in left middle lobe and right base that could be consistent with pneumonia.  Formal read suggests follow-up after opacities clear up to rule out mass  Type of Study Bedside Swallow Evaluation  Previous Swallow Assessment none  Diet Prior to this Study NPO  Temperature Spikes Noted No  History of Recent Intubation No  Behavior/Cognition Alert;Cooperative  Patient Positioning Upright in bed  Baseline Vocal Quality Hoarse  Volitional Cough Strong;Wet;Congested  Volitional Swallow Able to elicit  Oral Assessment (Complete on admission/transfer/change in patient condition)  Does patient have any of the following "high(er) risk" factors? None of the above  Does patient have any of the following "at risk" factors? Oxygen therapy - cannula, mask, simple oxygen devices  Patient is AT RISK Order set for Adult Oral Care Protocol initiated -  "At Risk Patients" option selected (see row information)  Ice Chips  Ice chips NT  Thin Liquid  Thin Liquid Impaired  Presentation Straw;Cup  Oral Phase Impairments Reduced labial seal  Pharyngeal  Phase Impairments Multiple swallows;Wet Vocal Quality;Cough - Immediate;Throat Clearing - Immediate  Nectar Thick Liquid  Nectar Thick Liquid NT  Puree  Puree Impaired  Presentation Spoon  Pharyngeal Phase Impairments Multiple swallows;Wet Vocal Quality;Cough - Immediate  Solid  Solid NT  SLP Assessment  Clinical Impression Statement (ACUTE ONLY) Pt demosntrates signs of aspiration with trials of thin and puree textures. Given adequate arousal but muliple  swallows, throat clearing, belching and hoarse wet vocal quality, suspect a baseline/chronic dysphagia. Pt denies difficulty but does not seem aware of his behaviors during swallowing. Will proceed with MBS for instrumental assessment of swallowing.   SLP Visit Diagnosis Dysphagia, unspecified (R13.10)  Impact on safety and function Severe aspiration risk;Risk for inadequate nutrition/hydration  Swallow Evaluation Recommendations  SLP Diet Recommendations NPO  Treatment Plan  Treatment Recommendations Defer until completion of intrumental exam  SLP Time Calculation  SLP Start Time (ACUTE ONLY) 1010  SLP Stop Time (ACUTE ONLY) 1020  SLP Time Calculation (min) (ACUTE ONLY) 10 min  SLP Evaluations  $ SLP Speech Visit 1 Visit  SLP Evaluations  $BSS Swallow 1 Procedure

## 2019-05-21 NOTE — Progress Notes (Signed)
MD was informed that patient's CBG now is 236; patient is still on Dextrose 10% @ 93ml/hr and he is eating his diner at this time. Will continue to monitor.

## 2019-05-21 NOTE — Progress Notes (Signed)
[name] is a 85 y.o. male patient admitted from ED awake, alert - oriented  X 4 - no acute distress noted.  VSS - Blood pressure (!) 88/41, pulse 91, temperature 98 F (36.7 C), temperature source Axillary, resp. rate 14, weight 53.2 kg, SpO2 95 %.    IV in place, occlusive dsg intact without redness.   Orientation to room, and floor completed with information packet given to patient/family.  Patient declined safety video at this time.  Admission INP armband ID verified with patient/family, and in place.    SR up x 2, fall assessment complete; patient not speaking therefore bed alarm on and bed in lowest position.  Call light within reach, difficult to demonstrate understanding given limited verbal communication. Skin, clean-dry- intact without evidence of bruising, or skin tears.    Skin to skin check completed by two RNs; skin integrity documented. No evidence of skin break down noted on exam.    Will cont to eval and treat per MD orders.  Howard Pouch, RN 05/21/2019 9:39 AM

## 2019-05-21 NOTE — Progress Notes (Signed)
Initial Nutrition Assessment  DOCUMENTATION CODES:   Severe malnutrition in context of chronic illness, Underweight  INTERVENTION:   -MVI with minerals daily -Ensure Enlive po TID, each supplement provides 350 kcal and 20 grams of protein -Magic cup TID with meals, each supplement provides 290 kcal and 9 grams of protein -Hormel Shake TID with meals, each supplement provides 520 kcals and 22 grams protein  NUTRITION DIAGNOSIS:   Severe Malnutrition related to chronic illness(heart failure, [name], DM) as evidenced by severe fat depletion, severe muscle depletion.  GOAL:   Patient will meet greater than or equal to 90% of their needs  MONITOR:   Diet advancement, Labs, Weight trends, Skin, I & O's, Supplement acceptance, PO intake  REASON FOR ASSESSMENT:   Consult Assessment of nutrition requirement/status  ASSESSMENT:   This is a 85 year old male with a past medical history significant for coronary artery disease, hyperlipidemia, chronic systolic heart failure (status post ICD placement), type 2 diabetes with associated retinopathy, [name] 3, urinary retention.  Patient's sister is primary historian.Patient arrived to the ED via EMS due to hypoglycemia.  Upon arrival to ED, patient was unresponsive.  Initial CBG 40.  Per EMS, is typically verbal and alert.  Patient received amp of D 50 in ED.  This did temporarily normalized the glucose however patient became hypoglycemic again and required initiation of D5 half normal saline to maintain appropriate glucose levels.  Pt admitted with AMS and hypoglycemia.   Reviewed I/O's: -1 L x 24 hours  UOP: 1 L x 24 hours  Case discussed with RN, who reports improvement in CBGS since initiation D10. SLP has evaluated pt; plan to keep NPO until instrumental testing done. ADDENDUM: Pt s/p MBSS and advanced to dysphagia 1 diet with nectar thick liquids.   Pt pleasant, however, unable to provide significant history. He is able to tell this RD that  he is in the hospital, but unable to provide any nutrition history.   Noted pt has experienced a 5.7% wt loss over the past 3 months. While this is not significant for time frame, it is concerning given advanced aged and hypoglycemia.   Medications reviewed and include dextrose 10% infusion @ 75 ml/hr.   Lab Results  Component Value Date   HGBA1C 8.1 (A) 03/03/2019   PTA DM medications are 20 units insulin degludec-liraglutide daily. Per ADA's Standards of Medical Care of Diabetes, glycemic targets for older adults who have multiple co-morbidities, cognitive impairments, and functional dependence should be less stringent (Hgb A1c <8.0-8.5).   Labs reviewed: CBGS: 59-75 (inpatient orders for glycemic control are none).   NUTRITION - FOCUSED PHYSICAL EXAM:    Most Recent Value  Orbital Region  Severe depletion  Upper Arm Region  Severe depletion  Thoracic and Lumbar Region  Severe depletion  Buccal Region  Severe depletion  Temple Region  Severe depletion  Clavicle Bone Region  Severe depletion  Clavicle and Acromion Bone Region  Severe depletion  Scapular Bone Region  Severe depletion  Dorsal Hand  Severe depletion  Patellar Region  Severe depletion  Anterior Thigh Region  Severe depletion  Posterior Calf Region  Severe depletion  Edema (RD Assessment)  None  Hair  Reviewed  Eyes  Reviewed  Mouth  Reviewed  Skin  Reviewed  Nails  Reviewed       Diet Order:   Diet Order            DIET - DYS 1 Room service appropriate? Yes; Fluid consistency: Nectar Thick  Diet effective now              EDUCATION NEEDS:   Not appropriate for education at this time  Skin:  Skin Assessment: Reviewed RN Assessment  Last BM:  05/21/19  Height:   Ht Readings from Last 1 Encounters:  05/21/19 5\' 8"  (1.727 m)    Weight:   Wt Readings from Last 1 Encounters:  05/21/19 53.2 kg    Ideal Body Weight:  70 kg  BMI:  Body mass index is 17.83 kg/m.  Estimated Nutritional Needs:    Kcal:  1600-1800  Protein:  70-85 grams  Fluid:  > 1.6 L    [name] A. Jimmye Norman, RD, LDN, [place] Registered Dietitian II Certified Diabetes Care and Education Specialist Pager: [phone] After hours Pager: [phone]

## 2019-05-21 NOTE — Progress Notes (Signed)
Subjective: Pt seen at the bedside this morning. Remains lethargic, but will arouse to voice. Nods that he is not nauseated or in any pain. Following commands on physical exam.  Objective:  Vital signs in last 24 hours: Vitals:   05/21/19 0757 05/21/19 1000 05/21/19 1135 05/21/19 1413  BP: (!) 88/41 (!) 90/46 (!) 97/51   Pulse: 91 81 83   Resp: 17 19 17    Temp: 98 F (36.7 C) 98.6 F (37 C) 97.9 F (36.6 C)   TempSrc: Axillary Axillary Axillary   SpO2: 95% 99% 97%   Weight:    53.2 kg  Height:    5\' 8"  (1.727 m)   Physical Exam Vitals signs and nursing note reviewed.  Constitutional:      General: He is not in acute distress.    Appearance: He is cachectic. He is ill-appearing (chronically).  Cardiovascular:     Rate and Rhythm: Normal rate and regular rhythm.     Heart sounds: Normal heart sounds.  Pulmonary:     Effort: Pulmonary effort is normal. No respiratory distress.     Breath sounds: Normal breath sounds.  Abdominal:     General: Abdomen is flat. Bowel sounds are normal.  Musculoskeletal:     Right lower leg: No edema.     Left lower leg: No edema.  Neurological:     Mental Status: He is lethargic.     Comments: Will follow commands.    Assessment/Plan:  [name] is a 85 year old M with significant PMH of CAD, hyperlipidemia, CHF s/p ICD placement, Type II DM with retinopathy, [name] 3, and urinary retention who presented for altered mental status thought to be attributed to hypoglycemia but will need further assessment when blood sugar corrected.  Altered mental status Hypoglycemia/Type 2 diabetes - A1c 8.1 on 02/2019 Hypoglycemic with blood sugar 43 on presentation. Blood sugar initially responded to amps of D50 however continued to struggle with hypoglycemia so now on D10 infusion. CT head negative for acute changes. Afebrile, without leukocytosis. Pt previously on degludec-liraglutide 34 units daily, but recently decreased to 20 units daily. Unable to  ascertain with any certainty how much patient has been taking due to his AMS - q1h CBGs - 63mL/hr D10   Weight loss - malnutrition vs malignancy ? Weight 128lbs (2018) > 333(5456) > 124 (02/2019) Colonoscopy in 2010 showed 80mm tubular adenoma and CT noncontrast abdomen in 2018 with some possible rectal wall thickening however difficult to visualize with lack of contrast.   - will need further investigation when pt more alert - consider nutrition consult   Hematuria - secondary to trauma from foley vs BPH vs malignancy vs constipation  Urinary retention History of obstruction in 2017 thought to be secondary to BPH. UA with blood and no nitrites or bacteria. Unclear if pt taking tamsulosin at home. Chronically elevated PSA though uptrending to 10.5 in 2019. - voiding trials and bladder scans when pt more alert - will continue to monitor, may need to consider urology consult or further imaging   Acute on Chronic kidney disease stage III - ?baseline, Cr 1.8 04/2019 Cr 2.52 on admission - decline in renal function likely secondary to urinary retention - continue to monitoring with BMPs   Systolic heart failure s/p ICD placement 2013 Echo in 2017 with EF 30-35% and mild stenosis of aortic valve - does not appear to be volume up today (no elevated JVP, crackles, or LE edema)  - monitor closely due to  need for dextrose containing fluids - will keep on D10 to try and decrease volume needed to treat pt's hypoglycemia   Coronary artery disease - 3 vessel CAD s/p stent placement in mid circumflex 2004 and s/p drug eluding stent placement 2005 Cath in 2017 with 40% stenosis of circumflex, LAD occluded chronically with collaterals present, and 50% stenosis of mid RCA.    Hyperlipidemia.  - continue lipitor 40mg  daily when able to take PO  HTN - holding home antihypertensives (carvedilol12.5mg  BID and Lasix 40mg  BID) due to hypotension   Diet - Dysphagia 1 - nectar thick  liquids Fluids - 80mL/hr D10 DVT ppx - heparin 5000 units subQ q8h CODE STATUS - FULL CODE   Dispo: Anticipated discharge in approximately 2-3 day(s).   Robert Horns, MD 05/21/2019, 3:12 PM Pager: [phone]

## 2019-05-21 NOTE — Progress Notes (Signed)
Modified Barium Swallow Progress Note  Patient Details  Name: [name] MRN: [id number] Date of Birth: 13-Dec-1930  Today's Date: 05/21/2019  Modified Barium Swallow completed.  Full report located under Chart Review in the Imaging Section.  Brief recommendations include the following:  Clinical Impression  Pt is an 85 year old male admitted for a hypoglycemic episode, but also found to have bialteral infiltrates. Instrumental assessment revealed significant oropharyngeal dysphagia with oral deficits incuding piecemeal swallowing due to decreased bolus cohesion. Transit at times can be more passive than active and thus timing of initiation is inconsistent and leads to decreased swallow efficiency. There is also decreased base of tongue retraction and pharyngeal peristalsis. There is typically severe vallecular and pyriform residue post swallow with frank penetration at times with pt sensation. Throat clearing and multiple swallows are part of pts baseline swallowing compensations to protect airway. Despite this, given the quantity of residue, particularly after solids are given, there are sensed aspiration events with thin liquids. It appears that pt tolerates an unmodified diet, but given acute impairment, pna and signs of deconditioning and poor nutrition, will modify diet to puree and nectar thick liquids to reduce aspiration and also reduce severity of pharyngeal residue during admission.  A chin tuck was highly beneficial in reducing vallecular residue. Recommend pt tuck his chin and sips from a straw.    Swallow Evaluation Recommendations       SLP Diet Recommendations: Dysphagia 1 (Puree) solids;Nectar thick liquid   Liquid Administration via: Cup;Straw           Compensations: Slow rate;Small sips/bites;Multiple dry swallows after each bite/sip;Follow solids with liquid;Clear throat intermittently;Effortful swallow;Chin tuck   Postural Changes: Remain semi-upright after after  feeds/meals (Comment)   Oral Care Recommendations: Oral care BID      [name], MA CCC-SLP  Acute Rehabilitation Services Pager [phone] Office [phone]   [name], [name] 05/21/2019,2:27 PM

## 2019-05-21 NOTE — Progress Notes (Signed)
Bladder scan - 101 ml. Will continue to monitor.

## 2019-05-21 NOTE — Progress Notes (Signed)
09:30 o'clock: CBG rechecked 152. Patient drowsy; responds to voice; denied pain. Will continue to monitor.

## 2019-05-21 NOTE — Progress Notes (Signed)
MEWS score 1; we will continue to monitor patient and check VS Q 4 hrs per protocol (patient - Progressive level of care)

## 2019-05-21 NOTE — Progress Notes (Signed)
Per patient's request. Patient's sister [name] was called and informed about his condition. All questions were answered. Will continue to monitor.

## 2019-05-21 NOTE — Progress Notes (Signed)
08:42 o'clock - patient received Dextrose 50% - 50 ml per standing order for CBG 59. Patient responsive to verbal stimulation; able to verbalize his name and he denied any pain. Skin - cold and clammy. MD was notified and also Dextrose 10% IV was started at 75 ml/hr per order. Will continue to monitor.

## 2019-05-21 NOTE — Evaluation (Signed)
Physical Therapy Evaluation Patient Details Name: [name] MRN: [id number] DOB: 05/18/1931 Today's Date: 05/21/2019   History of Present Illness  This is a 85 year old male with a past medical history significant for coronary artery disease, hyperlipidemia, chronic systolic heart failure (status post ICD placement), type 2 diabetes with associated retinopathy, [name] 3, urinary retention. Patient arrived to the ED via EMS due to hypoglycemia.  CXR opacities in left middle lobe and right base that could be consistent with pneumonia.  Clinical Impression   Pt admitted with above diagnosis. Unsure exactly of prior level of function and home situation; He did state he suse a RW to walk; presents to PT with decr activity tolerance, decr functional mobility; Noted concerns re: safety at home, especially with managing meds; Will order OT per protocol;  Pt currently with functional limitations due to the deficits listed below (see PT Problem List). Pt will benefit from skilled PT to increase their independence and safety with mobility to allow discharge to the venue listed below.       Follow Up Recommendations SNF;Supervision/Assistance - 24 hour;Other (comment)(Will consider back to apt if he progresses well and can show safety with managing ADLs)    Equipment Recommendations  None recommended by PT(but must verify that he has RW)    Recommendations for Other Services OT consult(will order per protocol)     Precautions / Restrictions Precautions Precautions: Fall      Mobility  Bed Mobility Overal bed mobility: Needs Assistance Bed Mobility: Supine to Sit     Supine to sit: Min guard     General bed mobility comments: Slow moving and used bedrails; no physical assist needed  Transfers Overall transfer level: Needs assistance Equipment used: Rolling walker (2 wheeled) Transfers: Sit to/from Stand Sit to Stand: Min assist         General transfer comment: Min assist to  steady RW  Ambulation/Gait Ambulation/Gait assistance: Herbalist (Feet): 15 Feet Assistive device: Rolling walker (2 wheeled) Gait Pattern/deviations: Step-through pattern;Decreased step length - right;Decreased step length - left Gait velocity: quite slow   General Gait Details: Cues to self-monitor for activity tolerance  Stairs            Wheelchair Mobility    Modified Rankin (Stroke Patients Only)       Balance Overall balance assessment: Needs assistance           Standing balance-Leahy Scale: Poor                               Pertinent Vitals/Pain Pain Assessment: No/denies pain    Home Living Family/patient expects to be discharged to:: Private residence Living Arrangements: Alone Available Help at Discharge: (unsure; meals are delivered from cafeteria) Type of Home: Apartment(Independent living) Home Access: Ramped entrance     Home Layout: One level Home Equipment: Walker - 4 wheels;Hand held shower head Additional Comments: Not sure how reliable a historian he is    Prior Function Level of Independence: Independent with assistive device(s)         Comments: Tells [place] he has a cane and RW; Tells [place] he cooks for himself, but then says they deliver meals to his apt -- not sure of reliability     Hand Dominance   Dominant Hand: Right    Extremity/Trunk Assessment   Upper Extremity Assessment Upper Extremity Assessment: Generalized weakness    Lower Extremity Assessment Lower Extremity  Assessment: Generalized weakness       Communication   Communication: HOH(did not talk much)  Cognition Arousal/Alertness: Awake/alert Behavior During Therapy: WFL for tasks assessed/performed Overall Cognitive Status: No family/caregiver present to determine baseline cognitive functioning                                        General Comments General comments (skin integrity, edema, etc.): Session  conducted on Room Air and O2 sats remained greater tahn or equal to 95%    Exercises     Assessment/Plan    PT Assessment Patient needs continued PT services  PT Problem List Decreased strength;Decreased activity tolerance;Decreased mobility;Decreased balance;Decreased coordination;Decreased cognition;Decreased knowledge of use of DME;Decreased safety awareness;Decreased knowledge of precautions       PT Treatment Interventions DME instruction;Gait training;Functional mobility training;Therapeutic activities;Therapeutic exercise;Balance training;Cognitive remediation;Patient/family education    PT Goals (Current goals can be found in the Care Plan section)  Acute Rehab PT Goals Patient Stated Goal: did not state, but agreeable to getting OOB PT Goal Formulation: With patient Time For Goal Achievement: 06/04/19 Potential to Achieve Goals: Good    Frequency Min 2X/week   Barriers to discharge        Co-evaluation               AM-PAC PT "6 Clicks" Mobility  Outcome Measure Help needed turning from your back to your side while in a flat bed without using bedrails?: None Help needed moving from lying on your back to sitting on the side of a flat bed without using bedrails?: None Help needed moving to and from a bed to a chair (including a wheelchair)?: A Little Help needed standing up from a chair using your arms (e.g., wheelchair or bedside chair)?: A Little Help needed to walk in hospital room?: A Little Help needed climbing 3-5 steps with a railing? : A Lot 6 Click Score: 19    End of Session Equipment Utilized During Treatment: Gait belt Activity Tolerance: Patient tolerated treatment well Patient left: in chair;with call bell/phone within reach;with chair alarm set Nurse Communication: Mobility status PT Visit Diagnosis: Unsteadiness on feet (R26.81);Other abnormalities of gait and mobility (R26.89);Muscle weakness (generalized) (M62.81)    Time: 4782-9562 PT  Time Calculation (min) (ACUTE ONLY): 23 min   Charges:   PT Evaluation $PT Eval Moderate Complexity: 1 Mod PT Treatments $Gait Training: 8-22 mins        Roney Marion, PT  Acute Rehabilitation Services Pager [phone] Office [place] 05/21/2019, 4:54 PM

## 2019-05-21 NOTE — Progress Notes (Signed)
  Date: 05/21/2019  Patient name: [name]  Medical record number: [id number]  Date of birth: 01-Mar-1931   I have seen and evaluated this patient and I have discussed the plan of care with the house staff. Please see their note for complete details. I concur with their findings with the following additions/corrections:   85 yo man with a history of [name] 3, T2DM, HFrEF, and CAD brought in by EMS for altered mental status and hypoglycemia.  The exact history of what occurred at home is unclear as the EMS run sheet is not available, he is unable to provide significant history at this time, and his sister was not present at the time.  He had recently been seen in our clinic for adjustment of his diabetes medications, and Lantus was replaced with xultophy.  There is some concern that he was continuing to take both.  On arrival, his glucose was 40 and he was unresponsive.  After administration of dextrose, his mental status improved.  On my exam, he was asleep but awakened to voice and was able to follow commands and nod yes and no in response to questions.  He is quite frail and cachectic.  His mucous membranes were slightly dry, but with a normal JVP.  The remainder of his exam was normal.  Significant test results include: ABG 7.45/38/58/28 Creatinine 2.5 (baseline 1.8-2) TSH 0.99 UA >50 WBCs, negative nitrites CT head with no acute changes CXR with increased bilateral interstitial opacities, no clear consolidation on my read  In summary, this is an 85 year old man with [name] 3, T2DM, HFrEF, and CAD admitted with acute encephalopathy due to hypoglycemia.  No signs of acute infection on exam, although he does have pyuria.  Most likely medication error, as his sister is concerned he was taking too high dose of insulin and may have continued Lantus in addition to xultophy. -We will check urine culture given pyuria and inability to describe symptoms -Continue IV dextrose to maintain glucose,  every hour CBG -Suspect hypoglycemic effect will be prolonged, especially if his overdose was on insulin degludec, may need to remain on IV dextrose for more than 24 hours  He will not be safe for discharge until he has demonstrated at least 24 hours without hypoglycemia off of IV dextrose  [name], M.D., Ph.D. 05/21/2019, 4:02 PM

## 2019-05-22 LAB — BASIC METABOLIC PANEL
Anion gap: 10 (ref 5–15)
BUN: 48 mg/dL — ABNORMAL HIGH (ref 8–23)
CO2: 24 mmol/L (ref 22–32)
Calcium: 8.9 mg/dL (ref 8.9–10.3)
Chloride: 97 mmol/L — ABNORMAL LOW (ref 98–111)
Creatinine, Ser: 2.33 mg/dL — ABNORMAL HIGH (ref 0.61–1.24)
GFR calc Af Amer: 28 mL/min — ABNORMAL LOW (ref >60)
GFR calc non Af Amer: 24 mL/min — ABNORMAL LOW (ref >60)
Glucose, Bld: 128 mg/dL — ABNORMAL HIGH (ref 70–99)
Potassium: 4.7 mmol/L (ref 3.5–5.1)
Sodium: 131 mmol/L — ABNORMAL LOW (ref 135–145)

## 2019-05-22 LAB — CBC
HCT: 38.4 % — ABNORMAL LOW (ref 39.0–52.0)
Hemoglobin: 12.7 g/dL — ABNORMAL LOW (ref 13.0–17.0)
MCH: 29.4 pg (ref 26.0–34.0)
MCHC: 33.1 g/dL (ref 30.0–36.0)
MCV: 88.9 fL (ref 80.0–100.0)
Platelets: 175 10*3/uL (ref 150–400)
RBC: 4.32 MIL/uL (ref 4.22–5.81)
RDW: 14.4 % (ref 11.5–15.5)
WBC: 14.6 10*3/uL — ABNORMAL HIGH (ref 4.0–10.5)
nRBC: 0 % (ref 0.0–0.2)

## 2019-05-22 LAB — GLUCOSE, CAPILLARY
Glucose-Capillary: 106 mg/dL — ABNORMAL HIGH (ref 70–99)
Glucose-Capillary: 118 mg/dL — ABNORMAL HIGH (ref 70–99)
Glucose-Capillary: 121 mg/dL — ABNORMAL HIGH (ref 70–99)
Glucose-Capillary: 126 mg/dL — ABNORMAL HIGH (ref 70–99)
Glucose-Capillary: 148 mg/dL — ABNORMAL HIGH (ref 70–99)
Glucose-Capillary: 174 mg/dL — ABNORMAL HIGH (ref 70–99)
Glucose-Capillary: 194 mg/dL — ABNORMAL HIGH (ref 70–99)
Glucose-Capillary: 217 mg/dL — ABNORMAL HIGH (ref 70–99)
Glucose-Capillary: 227 mg/dL — ABNORMAL HIGH (ref 70–99)

## 2019-05-22 LAB — FOLATE RBC
Folate, Hemolysate: 305 ng/mL
Folate, RBC: 693 ng/mL (ref >498)
Hematocrit: 44 % (ref 37.5–51.0)

## 2019-05-22 MED ORDER — SODIUM CHLORIDE 0.9 % IV SOLN
INTRAVENOUS | Status: AC
  Administered 2019-05-22: 10:00:00 1000 mL via INTRAVENOUS

## 2019-05-22 NOTE — Progress Notes (Signed)
Subjective: Pt seen at the bedside this morning. Sitting up in the bed and finishing breakfast. States he just remembers waking up in the hospital and was told he sugar was low. He says that he has been injecting 20 units twice a day, nodding that it was Lantus. Denies taking Xultophy, but remembers a new medication being added last month.  Objective:  Vital signs in last 24 hours: Vitals:   05/21/19 1535 05/21/19 2132 05/22/19 0009 05/22/19 0441  BP: (!) 91/50 (!) 107/52 (!) 103/54 (!) 96/58  Pulse: 85 88 86 79  Resp: 18 16 17 18   Temp: 98.2 F (36.8 C) 98.4 F (36.9 C) 98 F (36.7 C) 98 F (36.7 C)  TempSrc: Oral   Axillary  SpO2: 98% 97% 100% 97%  Weight:    57.4 kg  Height:       Physical Exam Vitals signs and nursing note reviewed.  Constitutional:      Appearance: He is cachectic.  Neck:     Vascular: No JVD.  Cardiovascular:     Rate and Rhythm: Normal rate and regular rhythm.     Heart sounds: Normal heart sounds. No murmur. No friction rub. No gallop.   Pulmonary:     Effort: Pulmonary effort is normal. No tachypnea or respiratory distress.  Abdominal:     General: Abdomen is flat. Bowel sounds are increased.     Tenderness: There is no abdominal tenderness.  Genitourinary:    Comments: 150cc of yellow urine in Foley bag  Musculoskeletal:     Right lower leg: No edema.     Left lower leg: No edema.  Neurological:     Mental Status: He is alert.      Assessment/Plan:  [name] is a 85 year old M with significant PMH of CAD, hyperlipidemia, CHF s/p ICD placement, Type II DM with retinopathy, [name] 3, and urinary retention who presented for altered mental status likely from hypoglycemia attributed to insulin overdose at home.  Altered mental status Hypoglycemia/Type 2 diabetes - A1c 8.1 on 02/2019 Hypoglycemic with blood sugar 43 on presentation. Blood sugar initially responded to amps of D50 however continued to struggle with hypoglycemia so transitioned to  D10 infusion. CT head negative for acute changes. Afebrile, without leukocytosis. Pt previously on Lantus 34 units daily but decreased to 20 units daily per note on 6/18, and then discontinued for initiation of insulin degludec-liraglutide (Xultophy) 20 units on 7/10.  - pt stating he takes 20 units twice a day, though still unclear what injectable his is using - q1h CBGs - D10 drip stopped at 1800 on 8/27, would like to see no hypoglycemic episodes for 24hr from then - will need education at discharge on appropriate diabetes regimen   Weight loss - malnutrition vs malignancy ? Weight 128lbs (2018) > 131(2019) > 124 (02/2019) Colonoscopy in 2010 showed 48mm tubular adenoma and CT noncontrast abdomen in 2018 with some possible rectal wall thickening however difficult to visualize with lack of contrast.   - eating meals here with supplement shakes - can continue to follow and work-up outpatient - PT recommending SNF and pt agreeable at this time, consult to social work placed   Hematuria - secondary to trauma from foley vs BPH vs malignancy vs constipation  Urinary retention History of obstruction in 2017 thought to be secondary to BPH. UA with blood and no nitrites or bacteria. Unclear if pt taking tamsulosin at home. Chronically elevated PSA though uptrending to 10.5 in 2019. -  gross hematuria resolved today with yellow urine in Foley - will leave Foley in to monitor urine output and then try to do voiding trials and bladder scans tomorrow - will continue to monitor, may need to consider urology consult or further imaging   Acute on Chronic kidney disease stage III - ?baseline, Cr 1.8 04/2019 Cr 2.52 on admission and 2.33 today - decline in renal function likely secondary to urinary retention - will give fluid bolus over the day - continue to monitoring with BMPs   Systolic heart failure s/p ICD placement 2013 Echo in 2017 with EF 30-35% and mild stenosis of aortic valve - does not  appear to be volume up today (no elevated JVP, crackles, or LE edema)  - continue to monitor closely due to need for dextrose containing fluids   Coronary artery disease - 3 vessel CAD s/p stent placement in mid circumflex 2004 and s/p drug eluding stent placement 2005 Cath in 2017 with 40% stenosis of circumflex, LAD occluded chronically with collaterals present, and 50% stenosis of mid RCA.   Hyperlipidemia.  - continue lipitor 40mg  PO daily  HTN  - holding home antihypertensives (carvedilol12.5mg  BID and Lasix 40mg  BID) due to soft pressures  Diet - Dysphagia 1 - nectar thick liquids Fluids - 162mL/hr NS for 10 hours DVT ppx - heparin 5000 units subQ q8h CODE STATUS - FULL CODE   Dispo: Anticipated discharge in approximately 1-2 day(s).   Robert Horns, MD 05/22/2019, 6:57 AM Pager: [phone]

## 2019-05-22 NOTE — Evaluation (Signed)
Occupational Therapy Evaluation Patient Details Name: [name] MRN: [id number] DOB: [birth year] Today's Date: 05/22/2019    History of Present Illness This is a 85 year old male with a past medical history significant for coronary artery disease, hyperlipidemia, chronic systolic heart failure (status post ICD placement), type 2 diabetes with associated retinopathy, [name] 3, urinary retention. Patient arrived to the ED via EMS due to hypoglycemia.  CXR opacities in left middle lobe and right base that could be consistent with pneumonia.   Clinical Impression   PTA: pt living alone and independent. Pt given Pillbox Test, but pt unable to complete. OT attempting Short Blessed Test, but pt became agitated. Pt requiring assist with ADL in standing with set-up to minA for UB ADL and maxA for LB ADL in standing as pt's balance is very poor. Pt appears to have little awareness of new deficits in balance and attempting to stand without assist. Pt a bit impulsive at times, but follows commands. Pt limited by weakness, poor mobility, decreased cognition and increased need for ADL assist. Pt would benefit from continued OT skilled services for ADL, mobility and safety in SNF setting. OT following acutely.    Follow Up Recommendations  SNF;Supervision/Assistance - 24 hour    Equipment Recommendations  3 in 1 bedside commode    Recommendations for Other Services       Precautions / Restrictions Precautions Precautions: Fall Restrictions Weight Bearing Restrictions: No      Mobility Bed Mobility Overal bed mobility: Needs Assistance Bed Mobility: Supine to Sit     Supine to sit: Min guard     General bed mobility comments: Slow moving and used bedrails; no physical assist needed  Transfers Overall transfer level: Needs assistance Equipment used: Rolling walker (2 wheeled) Transfers: Sit to/from Stand Sit to Stand: Min assist         General transfer comment: Min assist to steady  RW    Balance Overall balance assessment: Needs assistance Sitting-balance support: Feet supported Sitting balance-Leahy Scale: Fair     Standing balance support: Bilateral upper extremity supported Standing balance-Leahy Scale: Poor                             ADL either performed or assessed with clinical judgement   ADL Overall ADL's : Needs assistance/impaired Eating/Feeding: Modified independent;Sitting   Grooming: Min guard;Standing;Cueing for safety;Cueing for sequencing   Upper Body Bathing: Minimal assistance;Sitting   Lower Body Bathing: Maximal assistance;Cueing for safety;Cueing for sequencing;Sitting/lateral leans;Sit to/from stand Lower Body Bathing Details (indicate cue type and reason): Pt did not attempt in standing due to poor balance Upper Body Dressing : Set up;Sitting   Lower Body Dressing: Maximal assistance;Cueing for safety;Cueing for sequencing;Sitting/lateral leans;Sit to/from stand   Toilet Transfer: Minimal assistance;Stand-pivot;RW   Toileting- Clothing Manipulation and Hygiene: Maximal assistance;Cueing for safety;Cueing for sequencing;Sitting/lateral lean;Sit to/from stand Toileting - Clothing Manipulation Details (indicate cue type and reason): Did not attempt in standing due to poor balance     Functional mobility during ADLs: Moderate assistance;Rolling walker;Cueing for safety;Cueing for sequencing General ADL Comments: Pt requires cues for ADL and mobility due to poor strength, activity tolerance and decreased sustained attention     Vision Baseline Vision/History: No visual deficits Vision Assessment?: No apparent visual deficits;Vision impaired- to be further tested in functional context Additional Comments: Pt stating "I can see fine" and then "this print on the (medication) labels now I can't see that."  Perception     Praxis      Pertinent Vitals/Pain Pain Assessment: No/denies pain     Hand Dominance Right    Extremity/Trunk Assessment Upper Extremity Assessment Upper Extremity Assessment: Generalized weakness   Lower Extremity Assessment Lower Extremity Assessment: Generalized weakness   Cervical / Trunk Assessment Cervical / Trunk Assessment: Normal   Communication Communication Communication: HOH   Cognition Arousal/Alertness: Awake/alert Behavior During Therapy: WFL for tasks assessed/performed Overall Cognitive Status: No family/caregiver present to determine baseline cognitive functioning                                 General Comments: Pt given Pillbox Test, but pt unable to complete. halfway through reading the medication labels, pt stating "I can't see it." Task adapted to pt and pt able to point to where 2 medications would go after repeating the label with accuracy in 2/4 trials. Pt unable to complete task- failing pillbox test as of now. OT attempting Short Blessed Test, but pt became agitated.   General Comments  O2 sats >90% on RA    Exercises     Shoulder Instructions      Home Living Family/patient expects to be discharged to:: Private residence Living Arrangements: Alone Available Help at Discharge: Available PRN/intermittently Type of Home: Apartment Home Access: Ramped entrance     Home Layout: One level     Bathroom Shower/Tub: Tub/shower unit         Home Equipment: Environmental consultant - 4 wheels;Hand held shower head   Additional Comments: Not sure how reliable a historian he is      Prior Functioning/Environment Level of Independence: Independent with assistive device(s)        Comments: Tells [place] he has a cane and RW; Tells [place] he cooks for himself, but then says they deliver meals to his apt -- not sure of reliability        OT Problem List: Decreased strength;Decreased activity tolerance;Impaired balance (sitting and/or standing);Decreased coordination;Decreased safety awareness      OT Treatment/Interventions: Self-care/ADL  training;Therapeutic exercise;Neuromuscular education;Energy conservation;DME and/or AE instruction;Therapeutic activities;Patient/family education;Balance training    OT Goals(Current goals can be found in the care plan section) Acute Rehab OT Goals Patient Stated Goal: "I want to get OOB." OT Goal Formulation: With patient Time For Goal Achievement: 06/05/19 Potential to Achieve Goals: Good ADL Goals Pt Will Perform Grooming: with modified independence Pt Will Perform Lower Body Dressing: with modified independence;sit to/from stand Pt Will Perform Toileting - Clothing Manipulation and hygiene: with modified independence;sitting/lateral leans;sit to/from stand Additional ADL Goal #1: Pt will attend to higher level cognitive task x10 mins of sustained attention with 1-2 verbal cues to assist.  OT Frequency: Min 2X/week   Barriers to D/C: Decreased caregiver support  lives alone       Co-evaluation              AM-PAC OT "6 Clicks" Daily Activity     Outcome Measure Help from another person eating meals?: None Help from another person taking care of personal grooming?: A Lot Help from another person toileting, which includes using toliet, bedpan, or urinal?: A Lot Help from another person bathing (including washing, rinsing, drying)?: A Lot Help from another person to put on and taking off regular upper body clothing?: A Lot Help from another person to put on and taking off regular lower body clothing?: A Lot 6 Click Score: 14  End of Session Equipment Utilized During Treatment: Gait belt;Rolling walker Nurse Communication: Mobility status  Activity Tolerance: Patient tolerated treatment well Patient left: in chair;with call bell/phone within reach;with chair alarm set  OT Visit Diagnosis: Unsteadiness on feet (R26.81);Muscle weakness (generalized) (M62.81)                Time: 1100-1148 OT Time Calculation (min): 48 min Charges:  OT General Charges $OT Visit: 1  Visit OT Evaluation $OT Eval Moderate Complexity: 1 Mod OT Treatments $Self Care/Home Management : 8-22 mins $Neuromuscular Re-education: 8-22 mins  Darryl Nestle) Marsa Aris OTR/L Acute Rehabilitation Services Pager: [phone] Office: [place] 05/22/2019, 4:41 PM

## 2019-05-22 NOTE — Progress Notes (Signed)
  Date: 05/22/2019  Patient name: [name]  Medical record number: [id number]  Date of birth: Oct 26, 1930   I have seen and evaluated this patient and I have discussed the plan of care with the house staff. Please see their note for complete details. I concur with their findings with the following additions/corrections:   Much more alert and interactive today, was able to eat breakfast.  Glucose has normalized and he has been able to come off of the dextrose drip.  He does not remember exactly what he took, but reports taking Lantus 20 units twice daily.  He does not remember taking xultophy.  His creatinine continues to be elevated today at 2.3, it appears his baseline may be 1.8.  His JVP is flat and he has no signs of edema, we will give 1 L NS slowly and monitor his response.  He did have pyuria and hematuria on his initial UA, we are awaiting urine culture, but he denies any dysuria prior to admission.  PT/OT are recommending SNF for him, and he is agreeable at this time.  We will discuss with his family and, with their permission, proceed with finding placement for him.  [name], M.D., Ph.D. 05/22/2019, 2:02 PM

## 2019-05-23 LAB — CBC
HCT: 35.4 % — ABNORMAL LOW (ref 39.0–52.0)
Hemoglobin: 11.6 g/dL — ABNORMAL LOW (ref 13.0–17.0)
MCH: 29.2 pg (ref 26.0–34.0)
MCHC: 32.8 g/dL (ref 30.0–36.0)
MCV: 89.2 fL (ref 80.0–100.0)
Platelets: 137 10*3/uL — ABNORMAL LOW (ref 150–400)
RBC: 3.97 MIL/uL — ABNORMAL LOW (ref 4.22–5.81)
RDW: 14.5 % (ref 11.5–15.5)
WBC: 12.7 10*3/uL — ABNORMAL HIGH (ref 4.0–10.5)
nRBC: 0 % (ref 0.0–0.2)

## 2019-05-23 LAB — BASIC METABOLIC PANEL
Anion gap: 8 (ref 5–15)
BUN: 51 mg/dL — ABNORMAL HIGH (ref 8–23)
CO2: 25 mmol/L (ref 22–32)
Calcium: 8.8 mg/dL — ABNORMAL LOW (ref 8.9–10.3)
Chloride: 103 mmol/L (ref 98–111)
Creatinine, Ser: 2.17 mg/dL — ABNORMAL HIGH (ref 0.61–1.24)
GFR calc Af Amer: 31 mL/min — ABNORMAL LOW (ref >60)
GFR calc non Af Amer: 27 mL/min — ABNORMAL LOW (ref >60)
Glucose, Bld: 203 mg/dL — ABNORMAL HIGH (ref 70–99)
Potassium: 4 mmol/L (ref 3.5–5.1)
Sodium: 136 mmol/L (ref 135–145)

## 2019-05-23 LAB — GLUCOSE, CAPILLARY
Glucose-Capillary: 154 mg/dL — ABNORMAL HIGH (ref 70–99)
Glucose-Capillary: 198 mg/dL — ABNORMAL HIGH (ref 70–99)
Glucose-Capillary: 200 mg/dL — ABNORMAL HIGH (ref 70–99)
Glucose-Capillary: 204 mg/dL — ABNORMAL HIGH (ref 70–99)
Glucose-Capillary: 212 mg/dL — ABNORMAL HIGH (ref 70–99)
Glucose-Capillary: 217 mg/dL — ABNORMAL HIGH (ref 70–99)

## 2019-05-23 LAB — URINE CULTURE: Culture: 20000 — AB

## 2019-05-23 MED ORDER — INSULIN ASPART 100 UNIT/ML ~~LOC~~ SOLN: 0.0000 [IU] | Freq: Every day | SUBCUTANEOUS | Status: DC

## 2019-05-23 MED ORDER — OFLOXACIN 0.3 % OP SOLN: 1.0000 [drp] | Freq: Four times a day (QID) | OPHTHALMIC | Status: DC

## 2019-05-23 MED ORDER — INSULIN ASPART 100 UNIT/ML ~~LOC~~ SOLN
0.0000 [IU] | Freq: Three times a day (TID) | SUBCUTANEOUS | Status: DC
  Administered 2019-05-23 – 2019-05-24 (×5): 3 [IU] via SUBCUTANEOUS
  Administered 2019-05-25: 18:00:00 1 [IU] via SUBCUTANEOUS
  Administered 2019-05-25: 09:00:00 3 [IU] via SUBCUTANEOUS
  Administered 2019-05-25: 13:00:00 1 [IU] via SUBCUTANEOUS
  Administered 2019-05-26: 13:00:00 2 [IU] via SUBCUTANEOUS
  Administered 2019-05-26 – 2019-05-27 (×2): 1 [IU] via SUBCUTANEOUS

## 2019-05-23 MED ORDER — OFLOXACIN 0.3 % OP SOLN
1.0000 [drp] | Freq: Four times a day (QID) | OPHTHALMIC | Status: DC
  Administered 2019-05-23 – 2019-05-27 (×18): 1 [drp] via OPHTHALMIC
  Filled 2019-05-23: qty 5

## 2019-05-23 NOTE — Care Management Important Message (Signed)
Important Message  Patient Details  Name: [name] MRN: [id number] Date of Birth: Aug 31, 1931   Medicare Important Message Given:  Yes     [name] 05/23/2019, 1:21 PM

## 2019-05-23 NOTE — Progress Notes (Signed)
  Speech Language Pathology Treatment: Dysphagia  Patient Details Name: [name] MRN: [id number] DOB: Jul 13, 1931 Today's Date: 05/23/2019 Time: 1100-1110 SLP Time Calculation (min) (ACUTE ONLY): 10 min  Assessment / Plan / Recommendation Clinical Impression  Pt has been eating well. Provided min verbal cue for chin tuck and pt carried out throughout session. Decreased signs of aspiration though of course signs of dysphagia persist including multiple swallows and belching. Pt is happy with puree food. Encouraged him to recognize the benefit of a chin tuck and continue this position will all intake. If he were to d/c home expect pt to resume thin liquids, but hopefull use of chin tuck will be carried over.    HPI HPI: This is a 85 year old male with a past medical history significant for coronary artery disease, hyperlipidemia, chronic systolic heart failure (status post ICD placement), type 2 diabetes with associated retinopathy, [name] 3, urinary retention. Patient arrived to the ED via EMS due to hypoglycemia.  CXR opacities in left middle lobe and right base that could be consistent with pneumonia.  Formal read suggests follow-up after opacities clear up to rule out mass      SLP Plan  Continue with current plan of care       Recommendations  Diet recommendations: Dysphagia 1 (puree);Nectar-thick liquid Liquids provided via: Cup;Straw Medication Administration: Whole meds with liquid Supervision: Patient able to self feed Compensations: Slow rate;Small sips/bites;Multiple dry swallows after each bite/sip;Follow solids with liquid;Clear throat intermittently;Effortful swallow;Chin tuck Postural Changes and/or Swallow Maneuvers: Seated upright 90 degrees                Follow up Recommendations: Skilled Nursing facility SLP Visit Diagnosis: Dysphagia, unspecified (R13.10) Plan: Continue with current plan of care       GO               Robert Baltimore, MA [place] Pager [phone] Office [phone]  Lynann Beaver 05/23/2019, 1:09 PM

## 2019-05-23 NOTE — Progress Notes (Addendum)
Subjective: Pt seen at the bedside this morning. Feeling well. Endorses that his appetite is good and he is eating a lot in the hospital.  Objective:  Vital signs in last 24 hours: Vitals:   05/22/19 1623 05/22/19 2027 05/23/19 0023 05/23/19 0451  BP: (!) 109/57 (!) 115/57 113/66 (!) 104/52  Pulse: 84 87 77 82  Resp: 18 16 16 16   Temp: 98.6 F (37 C) 99.2 F (37.3 C) 98.6 F (37 C) 98.2 F (36.8 C)  TempSrc: Oral Oral Oral Oral  SpO2: 98% 100% 98% 99%  Weight:      Height:       Physical Exam Constitutional:      General: He is not in acute distress.    Appearance: He is cachectic.  Cardiovascular:     Rate and Rhythm: Normal rate and regular rhythm.     Heart sounds: Normal heart sounds.  Pulmonary:     Effort: Pulmonary effort is normal. No tachypnea or respiratory distress.     Breath sounds: Normal breath sounds.  Abdominal:     General: Abdomen is flat. Bowel sounds are normal.  Musculoskeletal:     Right lower leg: No edema.     Left lower leg: No edema.  Neurological:     Mental Status: He is alert.    Assessment/Plan: Mr. Mckesson is a 85 year old M with significant PMH of CAD, hyperlipidemia, CHF s/p ICD placement, Type II DM with retinopathy, [name] 3, and urinary retention who presented for altered mental status likely from hypoglycemia attributed to insulin overdose at home.   Altered mental status Hypoglycemia/Type 2 diabetes - A1c 8.1 on 02/2019 Hypoglycemic withblood sugar 43 on presentation. Blood sugar initially responded to amps of D50 however continued to struggle with hypoglycemia so transitioned to D10 infusion. CT head negative for acute changes. Afebrile, without leukocytosis. Pt previously on Lantus 34 units daily but decreased to 20 units daily per note on 6/18, and then discontinued for initiation of insulin degludec-liraglutide (Xultophy) 20 units on 7/10. Pt stating he takes 20 units twice a day, though still unclear what injectable his is  using. - off D10 drip without any hypoglycemic episodes - will add HS and mealtime sensitive sliding scale - will need education at discharge on appropriate diabetes regimen   Weight loss - malnutrition vs malignancy ? Weight 128lbs (2018) > 384(6659) > 124 (02/2019) Colonoscopy in 2010 showed 22mm tubular adenoma and CT noncontrast abdomen in 2018 with some possible rectal wall thickening however difficult to visualize with lack of contrast. - good appetite and eating meals here with supplement shakes - can continue to follow and work-up outpatient - PT recommending SNF and pt agreeable at this time, consult to social work placed   Urinary retention History of obstruction in 2017 thought to be secondary to BPH. UA with blood and no nitrites or bacteria.Unclear if pt taking tamsulosin at home. Chronically elevated PSA though uptrending to 10.5 in 2019. - gross hematuria resolved - will leave Foley in to monitor urine output and avoid repetitive insertions as it is likely difficult and traumatic with pt's BPH - can attempt voiding trials and bladder scans before SNF placement - will continue to monitor, may need to consider urology consultor further imaging   Acute onChronic kidney disease stage III - ?baseline, Cr 1.8 04/2019 Cr 2.52 on admission and 2.1 today. Decline in renal function on admission likely secondary to urinary retention and dehydration as it improved initially with Foley  and again with fluid bolus yesterday - continue to monitoring with BMPs   Systolic heart failures/p ICD placement 2013 Echo in 2017 with EF 30-35% and mild stenosis of aortic valve. Does not appear to be volume up today even with fluid yesterday. - continue to monitor   Coronary artery disease - 3 vessel CAD s/p stent placement in mid circumflex 2004 and s/p drug eluding stent placement 2005 Cath in 2017 with 40% stenosis of circumflex, LAD occluded chronically with collaterals present,  and 50% stenosis of mid RCA.  Hyperlipidemia. - continue lipitor 40mg  PO daily  HTN  - holding home antihypertensives (carvedilol12.5mg  BID and Lasix 40mg  BID)  Diet - dysphagia 1 - nectar thick liquids Fluids - none DVT ppx -heparin 5000 units subQ q8h CODE STATUS -FULL CODE  Dispo: Anticipated discharge pending SNF placement  Ladona Horns, MD 05/23/2019, 6:52 AM Pager: [phone]

## 2019-05-23 NOTE — Progress Notes (Signed)
  Date: 05/23/2019  Patient name: [name]  Medical record number: [id number]  Date of birth: Jan 26, 1931   I have seen and evaluated this patient and I have discussed the plan of care with the house staff. Please see their note for complete details. I concur with their findings with the following additions/corrections:   85 year old man admitted for severe hypoglycemia with unresponsiveness, likely related to accidental medication overdose.  Glucose has stabilized off of IV dextrose.  We will cautiously resume low-dose insulin today.  Holding off on resuming liraglutide until his renal function stabilizes.  PT and OT have recommended SNF, he indicated he was willing to consider returning to the same SNF he had used previously.  [name], M.D., Ph.D. 05/23/2019, 6:02 PM

## 2019-05-24 DIAGNOSIS — I130 Hypertensive heart and chronic kidney disease with heart failure and stage 1 through stage 4 chronic kidney disease, or unspecified chronic kidney disease: Secondary | ICD-10-CM

## 2019-05-24 DIAGNOSIS — Z960 Presence of urogenital implants: Secondary | ICD-10-CM

## 2019-05-24 DIAGNOSIS — Z955 Presence of coronary angioplasty implant and graft: Secondary | ICD-10-CM

## 2019-05-24 DIAGNOSIS — I2510 Atherosclerotic heart disease of native coronary artery without angina pectoris: Secondary | ICD-10-CM

## 2019-05-24 DIAGNOSIS — R1310 Dysphagia, unspecified: Secondary | ICD-10-CM

## 2019-05-24 DIAGNOSIS — R339 Retention of urine, unspecified: Secondary | ICD-10-CM

## 2019-05-24 DIAGNOSIS — E785 Hyperlipidemia, unspecified: Secondary | ICD-10-CM

## 2019-05-24 DIAGNOSIS — E1122 Type 2 diabetes mellitus with diabetic chronic kidney disease: Secondary | ICD-10-CM

## 2019-05-24 DIAGNOSIS — E11319 Type 2 diabetes mellitus with unspecified diabetic retinopathy without macular edema: Secondary | ICD-10-CM

## 2019-05-24 DIAGNOSIS — Z79899 Other long term (current) drug therapy: Secondary | ICD-10-CM

## 2019-05-24 DIAGNOSIS — N183 Chronic kidney disease, stage 3 (moderate): Secondary | ICD-10-CM

## 2019-05-24 DIAGNOSIS — E11649 Type 2 diabetes mellitus with hypoglycemia without coma: Principal | ICD-10-CM

## 2019-05-24 DIAGNOSIS — R4182 Altered mental status, unspecified: Secondary | ICD-10-CM

## 2019-05-24 DIAGNOSIS — R634 Abnormal weight loss: Secondary | ICD-10-CM

## 2019-05-24 DIAGNOSIS — Z95810 Presence of automatic (implantable) cardiac defibrillator: Secondary | ICD-10-CM

## 2019-05-24 DIAGNOSIS — N179 Acute kidney failure, unspecified: Secondary | ICD-10-CM

## 2019-05-24 DIAGNOSIS — Z794 Long term (current) use of insulin: Secondary | ICD-10-CM

## 2019-05-24 DIAGNOSIS — I5020 Unspecified systolic (congestive) heart failure: Secondary | ICD-10-CM

## 2019-05-24 LAB — CBC
HCT: 34.8 % — ABNORMAL LOW (ref 39.0–52.0)
Hemoglobin: 11.4 g/dL — ABNORMAL LOW (ref 13.0–17.0)
MCH: 29.5 pg (ref 26.0–34.0)
MCHC: 32.8 g/dL (ref 30.0–36.0)
MCV: 89.9 fL (ref 80.0–100.0)
Platelets: 155 10*3/uL (ref 150–400)
RBC: 3.87 MIL/uL — ABNORMAL LOW (ref 4.22–5.81)
RDW: 14.6 % (ref 11.5–15.5)
WBC: 12.6 10*3/uL — ABNORMAL HIGH (ref 4.0–10.5)
nRBC: 0 % (ref 0.0–0.2)

## 2019-05-24 LAB — BASIC METABOLIC PANEL
Anion gap: 8 (ref 5–15)
BUN: 44 mg/dL — ABNORMAL HIGH (ref 8–23)
CO2: 24 mmol/L (ref 22–32)
Calcium: 9 mg/dL (ref 8.9–10.3)
Chloride: 106 mmol/L (ref 98–111)
Creatinine, Ser: 1.81 mg/dL — ABNORMAL HIGH (ref 0.61–1.24)
GFR calc Af Amer: 38 mL/min — ABNORMAL LOW (ref >60)
GFR calc non Af Amer: 33 mL/min — ABNORMAL LOW (ref >60)
Glucose, Bld: 168 mg/dL — ABNORMAL HIGH (ref 70–99)
Potassium: 3.9 mmol/L (ref 3.5–5.1)
Sodium: 138 mmol/L (ref 135–145)

## 2019-05-24 LAB — GLUCOSE, CAPILLARY
Glucose-Capillary: 228 mg/dL — ABNORMAL HIGH (ref 70–99)
Glucose-Capillary: 234 mg/dL — ABNORMAL HIGH (ref 70–99)
Glucose-Capillary: 203 mg/dL — ABNORMAL HIGH (ref 70–99)
Glucose-Capillary: 158 mg/dL — ABNORMAL HIGH (ref 70–99)

## 2019-05-24 MED ORDER — INSULIN ASPART 100 UNIT/ML ~~LOC~~ SOLN
3.0000 [IU] | Freq: Three times a day (TID) | SUBCUTANEOUS | Status: DC
  Administered 2019-05-24 – 2019-05-26 (×7): 3 [IU] via SUBCUTANEOUS

## 2019-05-24 NOTE — Progress Notes (Signed)
Subjective: Pt seen at the bedside this morning. States he is feeling well, and his appetite is good. Asking about when he will be able to go to rehab. Is having some irritation with the Foley catheter.  Objective:  Vital signs in last 24 hours: Vitals:   05/24/19 0104 05/24/19 0511 05/24/19 0531 05/24/19 0837  BP: (!) 117/57  (!) 130/57 (!) 101/44  Pulse: 80  90 (!) 40  Resp: 16  16 14   Temp: 98.8 F (37.1 C)  99.1 F (37.3 C) 98.4 F (36.9 C)  TempSrc: Oral  Oral Oral  SpO2: 98%  97% 99%  Weight:  58.6 kg    Height:       Physical Exam Constitutional:      General: He is not in acute distress.    Appearance: He is cachectic.  Cardiovascular:     Rate and Rhythm: Normal rate and regular rhythm.     Heart sounds: Normal heart sounds.  Pulmonary:     Effort: Pulmonary effort is normal. No tachypnea or respiratory distress.     Breath sounds: Normal breath sounds.  Abdominal:     General: Abdomen is flat. Bowel sounds are normal.  Genitourinary:    Comments: Urine clear and yellow in Foley bag. Musculoskeletal:     Right lower leg: No edema.     Left lower leg: No edema.  Neurological:     Mental Status: He is alert.    Assessment/Plan: Mr. Karner is a 85 year old M with significant PMH of CAD, hyperlipidemia, CHF s/p ICD placement, Type II DM with retinopathy, [name] 3, and urinary retention who presented for altered mental status likely from hypoglycemia attributed to insulin overdose at home.   Altered mental status Hypoglycemia/Type 2 diabetes - A1c 8.1 on 02/2019 Hypoglycemic withblood sugar 43 on presentation. Blood sugar initially responded to amps of D50 however continued to struggle with hypoglycemia so transitioned to D10 infusion. CT head negative for acute changes. Pt previously on Lantus 34 units daily but decreased to 20 units daily per note on 6/18, and then discontinued for initiation of insulin degludec-liraglutide (Xultophy) 20 units on 7/10. Pt stating  he takes 20 units twice a day, though still unclear what injectable his is using. - no further hypoglycemic episodes in the hospital - on HS and mealtime sensitive sliding scale - will need education at discharge on appropriate diabetes regimen   Weight loss - malnutrition vs malignancy ? being followed outpatient Weight 128lbs (2018) > 131(2019) > 124 (02/2019) - good appetite and eating meals here with supplement shakes - can continue to follow outpatient - PT recommending SNF and pt agreeable at this time, consult to social work placed   Urinary retention - Foley placed 8/25 History of obstruction in 2017 thought to be secondary to BPH. UA with blood and no nitrites or bacteria.Unclear if pt taking tamsulosin at home. Chronically elevated PSA though uptrending to 10.5 in 2019. Gross hematuria now resolved. - will leave Foley in to monitor urine output and avoid repetitive insertions as it is likely difficult and traumatic with pt's BPH - can attempt voiding trials and bladder scans before SNF placement - will continue to monitor, may need to consider urology consultor further imaging   Acute onChronic kidney disease stage III - ?baseline, Cr 1.8 04/2019 Cr 2.52 on admission. Decline in renal function on admission likely secondary to urinary retention and dehydration as it improved initially with Foley and again with fluid bolus yesterday -  continues to improve to 1.81 today - continue to monitoring with BMPs   Systolic heart failures/p ICD placement 2013 Echo in 2017 with EF 30-35% and mild stenosis of aortic valve. - continue to monitor pt's fluid status   Coronary artery disease - 3 vessel CAD s/p stent placement in mid circumflex 2004 and s/p drug eluding stent placement 2005 Cath in 2017 with 40% stenosis of circumflex, LAD occluded chronically with collaterals present, and 50% stenosis of mid RCA.  Hyperlipidemia. - continue lipitor 40mg  PO daily  HTN  -  holding home antihypertensives (carvedilol12.5mg  BID and Lasix 40mg  BID)   Diet - dysphagia 1 - nectar thick liquids Fluids - none DVT ppx -heparin 5000 units subQ q8h CODE STATUS -FULL CODE  Dispo: Anticipated discharge pending SNF placement    Ladona Horns, MD 05/24/2019, 9:58 AM Pager: [phone]

## 2019-05-24 NOTE — Progress Notes (Signed)
  Date: 05/24/2019  Patient name: [name]  Medical record number: [id number]  Date of birth: 04/14/1931        I have seen and evaluated this patient and I have discussed the plan of care with the house staff. Please see their note for complete details. I concur with their findings.  Bartholomew Crews, MD 05/24/2019, 2:17 PM

## 2019-05-25 LAB — CBC
HCT: 33.8 % — ABNORMAL LOW (ref 39.0–52.0)
Hemoglobin: 10.9 g/dL — ABNORMAL LOW (ref 13.0–17.0)
MCH: 29.1 pg (ref 26.0–34.0)
MCHC: 32.2 g/dL (ref 30.0–36.0)
MCV: 90.4 fL (ref 80.0–100.0)
Platelets: 173 10*3/uL (ref 150–400)
RBC: 3.74 MIL/uL — ABNORMAL LOW (ref 4.22–5.81)
RDW: 14.8 % (ref 11.5–15.5)
WBC: 12.2 10*3/uL — ABNORMAL HIGH (ref 4.0–10.5)
nRBC: 0 % (ref 0.0–0.2)

## 2019-05-25 LAB — BASIC METABOLIC PANEL
Anion gap: 8 (ref 5–15)
BUN: 43 mg/dL — ABNORMAL HIGH (ref 8–23)
CO2: 26 mmol/L (ref 22–32)
Calcium: 9 mg/dL (ref 8.9–10.3)
Chloride: 107 mmol/L (ref 98–111)
Creatinine, Ser: 1.65 mg/dL — ABNORMAL HIGH (ref 0.61–1.24)
GFR calc Af Amer: 43 mL/min — ABNORMAL LOW (ref >60)
GFR calc non Af Amer: 37 mL/min — ABNORMAL LOW (ref >60)
Glucose, Bld: 162 mg/dL — ABNORMAL HIGH (ref 70–99)
Potassium: 4 mmol/L (ref 3.5–5.1)
Sodium: 141 mmol/L (ref 135–145)

## 2019-05-25 LAB — GLUCOSE, CAPILLARY
Glucose-Capillary: 229 mg/dL — ABNORMAL HIGH (ref 70–99)
Glucose-Capillary: 141 mg/dL — ABNORMAL HIGH (ref 70–99)
Glucose-Capillary: 144 mg/dL — ABNORMAL HIGH (ref 70–99)
Glucose-Capillary: 168 mg/dL — ABNORMAL HIGH (ref 70–99)

## 2019-05-25 MED ORDER — ENOXAPARIN SODIUM 30 MG/0.3ML ~~LOC~~ SOLN
30.0000 mg | SUBCUTANEOUS | Status: DC
  Administered 2019-05-25 – 2019-05-26 (×2): 30 mg via SUBCUTANEOUS
  Filled 2019-05-25 (×2): qty 0.3

## 2019-05-25 NOTE — NC FL2 (Signed)
[place] LEVEL OF CARE SCREENING TOOL     IDENTIFICATION  Patient Name: [name] Birthdate: [birth year] Sex: male Admission Date (Current Location): 05/20/2019  [facility] [place] Number:  Herbalist and Address:  [facility], [place] [address] [place], [place] [address]      Provider Number: [id number]  Attending Physician Name and Address:  Oda Kilts, MD  Relative Name and Phone Number:       Current Level of Care: Hospital Recommended Level of Care: [place] Prior Approval Number:    Date Approved/Denied:   PASRR Number: [id number]  Discharge Plan: SNF    Current Diagnoses: Patient Active Problem List   Diagnosis Date Noted  . Protein-calorie malnutrition, severe 05/21/2019  . Acute encephalopathy 05/20/2019  . Hypertensive retinopathy of both eyes 02/05/2018  . Mild nonproliferative diabetic retinopathy of both eyes without macular edema associated with type 2 diabetes mellitus ([place]) 02/05/2018  . Sensorineural hearing loss (SNHL), bilateral 12/11/2017  . Dry eye syndrome of both eyes 06/06/2017  . Weight loss 12/11/2016  . Advanced care planning/counseling discussion 12/11/2016  . Hypoglycemia 01/27/2016  . Urinary retention 01/13/2016  . Healthcare maintenance 11/22/2015  . [name] (chronic kidney disease) stage 3, GFR 30-59 ml/min (HCC) 12/04/2014  . Automatic implantable cardioverter-defibrillator in situ 01/16/2012  . Constipation 03/10/2009  . Coronary atherosclerosis 12/25/2007  . Chronic systolic congestive heart failure ([place]) 12/25/2007  . Hyperlipidemia 01/02/2007  . Type 2 diabetes mellitus with peripheral vascular disease ([place]) 01/01/1989    Orientation RESPIRATION BLADDER Height & Weight     Self, Place, Situation  Normal Incontinent, Indwelling catheter Weight: 132 lb 15 oz (60.3 kg) Height:  5\' 8"  (172.7 cm)(08/06/17)  BEHAVIORAL SYMPTOMS/MOOD NEUROLOGICAL BOWEL  NUTRITION STATUS      Continent Diet(see DC summary)  AMBULATORY STATUS COMMUNICATION OF NEEDS Skin   Limited Assist Verbally Normal                       Personal Care Assistance Level of Assistance  Bathing, Dressing, Feeding Bathing Assistance: Limited assistance Feeding assistance: Independent Dressing Assistance: Limited assistance     Functional Limitations Info  Sight, Hearing, Speech Sight Info: Impaired Hearing Info: Impaired Speech Info: Adequate    SPECIAL CARE FACTORS FREQUENCY  PT (By licensed PT), OT (By licensed OT), Speech therapy     PT Frequency: 5x/wk OT Frequency: 5x/wk     Speech Therapy Frequency: 5x/wk      Contractures Contractures Info: Not present    Additional Factors Info  Code Status, Allergies, Insulin Sliding Scale Code Status Info: Full Allergies Info: NKA   Insulin Sliding Scale Info: 0-9 units 3x/day with meals; 3 units 3x/day with meals; 0-5 units daily at bed       Current Medications (05/25/2019):  This is the current hospital active medication list Current Facility-Administered Medications  Medication Dose Route Frequency Provider Last Rate Last Dose  . 0.9 %  sodium chloride infusion   Intravenous PRN Oda Kilts, MD 10 mL/hr at 05/21/19 0215 250 mL at 05/21/19 0215  . acetaminophen (TYLENOL) tablet 650 mg  650 mg Oral Q6H PRN Chundi, Verne Spurr, MD   650 mg at 05/25/19 2952   Or  . acetaminophen (TYLENOL) suppository 650 mg  650 mg Rectal Q6H PRN Chundi, Vahini, MD      . albuterol (PROVENTIL) (2.5 MG/3ML) 0.083% nebulizer solution 3 mL  3 mL Inhalation Q6H PRN  Lars Mage, MD      . aspirin EC tablet 81 mg  81 mg Oral Daily Chundi, Vahini, MD   81 mg at 05/25/19 0852  . atorvastatin (LIPITOR) tablet 40 mg  40 mg Oral q1800 Chundi, Vahini, MD   40 mg at 05/24/19 1841  . enoxaparin (LOVENOX) injection 30 mg  30 mg Subcutaneous Q24H Winfrey, William B, MD      . feeding supplement (ENSURE ENLIVE) (ENSURE ENLIVE)  liquid 237 mL  237 mL Oral TID BM Oda Kilts, MD   237 mL at 05/24/19 2126  . insulin aspart (novoLOG) injection 0-5 Units  0-5 Units Subcutaneous QHS Guadlupe Spanish B, MD      . insulin aspart (novoLOG) injection 0-9 Units  0-9 Units Subcutaneous TID WC Katherine Roan, MD   1 Units at 05/25/19 1308  . insulin aspart (novoLOG) injection 3 Units  3 Units Subcutaneous TID WC Katherine Roan, MD   3 Units at 05/25/19 1308  . multivitamin with minerals tablet 1 tablet  1 tablet Oral Daily Oda Kilts, MD   1 tablet at 05/25/19 [phone]  . ofloxacin (OCUFLOX) 0.3 % ophthalmic solution 1 drop  1 drop Both Eyes QID Oda Kilts, MD   1 drop at 05/25/19 1309  . pantoprazole (PROTONIX) EC tablet 40 mg  40 mg Oral Daily Chundi, Vahini, MD   40 mg at 05/25/19 0852  . promethazine (PHENERGAN) tablet 12.5 mg  12.5 mg Oral Q6H PRN Chundi, Vahini, MD      . Resource ThickenUp Clear   Oral PRN Oda Kilts, MD      . senna-docusate (Senokot-S) tablet 1 tablet  1 tablet Oral QHS PRN Chundi, Vahini, MD      . tamsulosin (FLOMAX) capsule 0.8 mg  0.8 mg Oral QPC supper Chundi, Verne Spurr, MD   0.8 mg at 05/24/19 1841     Discharge Medications: Please see discharge summary for a list of discharge medications.  Relevant Imaging Results:  Relevant Lab Results:   Additional Information SS#: [id number]  Geralynn Ochs, LCSW

## 2019-05-25 NOTE — TOC Initial Note (Signed)
Transition of Care [facility]) - Initial/Assessment Note    Patient Details  Name: [name] MRN: [id number] Date of Birth: January 22, 1931  Transition of Care [facility]) CM/SW Contact:    Geralynn Ochs, LCSW Phone Number: 05/25/2019, 2:31 PM  Clinical Narrative:   CSW met with patient to discuss discharge plans. Patient agreeable to SNF, says he's [facility] before and would like to go back there. CSW to follow up on bed availability at [facility] and initiate insurance authorization process.                 Expected Discharge Plan: Skilled Nursing Facility Barriers to Discharge: Continued Medical Work up, Ship broker   Patient Goals and CMS Choice Patient states their goals for this hospitalization and ongoing recovery are:: to get to rehab soon CMS Medicare.gov Compare Post Acute Care list provided to:: Patient Choice offered to / list presented to : Patient  Expected Discharge Plan and Services Expected Discharge Plan: [place] Choice: [place] Living arrangements for the past 2 months: Apartment                                      Prior Living Arrangements/Services Living arrangements for the past 2 months: Apartment Lives with:: Self Patient language and need for interpreter reviewed:: No Do you feel safe going back to the place where you live?: Yes      Need for Family Participation in Patient Care: No (Comment) Care giver support system in place?: No (comment) Current home services: DME Criminal Activity/Legal Involvement Pertinent to Current Situation/Hospitalization: No - Comment as needed  Activities of Daily Living      Permission Sought/Granted Permission sought to share information with : Chartered certified accountant granted to share information with : Yes, Verbal Permission Granted     Permission granted to share info w AGENCY: SNF        Emotional  Assessment Appearance:: Appears stated age Attitude/Demeanor/Rapport: Engaged Affect (typically observed): Pleasant Orientation: : Oriented to Self, Oriented to Place, Oriented to Situation Alcohol / Substance Use: Not Applicable Psych Involvement: No (comment)  Admission diagnosis:  Urinary retention [R33.9] Hypoglycemia [E16.2] Renal insufficiency [N28.9] Abnormal chest x-ray [R93.89] Altered mental status, unspecified altered mental status type [R41.82] Patient Active Problem List   Diagnosis Date Noted  . Protein-calorie malnutrition, severe 05/21/2019  . Acute encephalopathy 05/20/2019  . Hypertensive retinopathy of both eyes 02/05/2018  . Mild nonproliferative diabetic retinopathy of both eyes without macular edema associated with type 2 diabetes mellitus ([place]) 02/05/2018  . Sensorineural hearing loss (SNHL), bilateral 12/11/2017  . Dry eye syndrome of both eyes 06/06/2017  . Weight loss 12/11/2016  . Advanced care planning/counseling discussion 12/11/2016  . Hypoglycemia 01/27/2016  . Urinary retention 01/13/2016  . Healthcare maintenance 11/22/2015  . [name] (chronic kidney disease) stage 3, GFR 30-59 ml/min (HCC) 12/04/2014  . Automatic implantable cardioverter-defibrillator in situ 01/16/2012  . Constipation 03/10/2009  . Coronary atherosclerosis 12/25/2007  . Chronic systolic congestive heart failure ([place]) 12/25/2007  . Hyperlipidemia 01/02/2007  . Type 2 diabetes mellitus with peripheral vascular disease ([place]) 01/01/1989   PCP:  Axel Filler, MD Pharmacy:   [facility] [place], [place] AT [address] [place] [address] Phone: [phone] Fax:  [phone]     Social Determinants of Health (SDOH) Interventions    Readmission Risk Interventions No flowsheet data found.

## 2019-05-25 NOTE — Progress Notes (Signed)
   Subjective: Pt reports some eye pain and asks for his eye drops.  He has no other complaints today, no dyspnea, appetite is good  Objective:  Vital signs in last 24 hours: Vitals:   05/24/19 2026 05/25/19 0448 05/25/19 0558 05/25/19 0828  BP: 128/62 115/65  (!) 107/57  Pulse: 82 79  77  Resp: 18 18  17   Temp: 98.9 F (37.2 C) 98.4 F (36.9 C)  98.2 F (36.8 C)  TempSrc: Oral Oral  Oral  SpO2: 100% 100%  99%  Weight:   60.3 kg   Height:          Assessment/Plan: [name] is a 85 year old M with significant PMH of CAD, hyperlipidemia, CHF s/p ICD placement, Type II DM with retinopathy, [name] 3, and urinary retention who presented for altered mental status likely from hypoglycemia attributed to insulin overdose at home.   Altered mental status Hypoglycemia/Type 2 diabetes - A1c 8.1 on 02/2019 Hypoglycemic withblood sugar 43 on presentation. Blood sugar initially responded to amps of D50 however continued to struggle with hypoglycemia so transitioned to D10 infusion. CT head negative for acute changes. Pt previously on Lantus 34 units daily but decreased to 20 units daily per note on 6/18, and then discontinued for initiation of insulin degludec-liraglutide (Xultophy) 20 units on 7/10. Pt stating he takes 20 units twice a day, though still unclear what injectable his is using. No further hypoglycemic episodes in the hospital -Continue SSI and mealtime -will need education at discharge on appropriate diabetes regimen   Weight loss - malnutrition vs malignancy ? being followed outpatient Weight 128lbs (2018) > 131(2019) > 124 (02/2019)PT recommending SNF and pt agreeable at this time, consult to social work placed - continue supplement shakes - can continue to follow outpatient   Urinary retention - Foley placed 8/25 History of obstruction in 2017 thought to be secondary to BPH. UA with blood and no nitrites or bacteria.Unclear if pt taking tamsulosin at home. Chronically  elevated PSA though uptrending to 10.5 in 2019. Gross hematuria now resolved. will leave Foley in to monitor urine output and avoid repetitive insertions as it is likely difficult and traumatic with pt's BPH - can consider attempt voiding trials and bladder scans before SNF placement if pt remains here a while - outpt urology follow up on d/c   Acute onChronic kidney disease stage III - ?baseline, Cr 1.8 04/2019 Cr 2.52 on admission. Decline in renal function on admission likely secondary to urinary retention and dehydration as it improved initially with Foley and IVF - continues to improve    Systolic heart failures/p ICD placement 2013 Echo in 2017 with EF 30-35% and mild stenosis of aortic valve. - continue to monitor pt's fluid status   Coronary artery disease - 3 vessel CAD s/p stent placement in mid circumflex 2004 and s/p drug eluding stent placement 2005 Cath in 2017 with 40% stenosis of circumflex, LAD occluded chronically with collaterals present, and 50% stenosis of mid RCA.  Hyperlipidemia. - continue lipitor 40mg  PO daily  HTN  - holding home antihypertensives (carvedilol12.5mg  BID and Lasix 40mg  BID)   Diet - dysphagia 1 - nectar thick liquids Fluids - none DVT ppx -lovenox 40 q24  CODE STATUS -FULL CODE  Dispo: Anticipated discharge pending SNF placement    Robert Roan, MD 05/25/2019, 9:12 AM

## 2019-05-26 LAB — BASIC METABOLIC PANEL
Anion gap: 7 (ref 5–15)
BUN: 38 mg/dL — ABNORMAL HIGH (ref 8–23)
CO2: 26 mmol/L (ref 22–32)
Calcium: 9.3 mg/dL (ref 8.9–10.3)
Chloride: 110 mmol/L (ref 98–111)
Creatinine, Ser: 1.66 mg/dL — ABNORMAL HIGH (ref 0.61–1.24)
GFR calc Af Amer: 43 mL/min — ABNORMAL LOW (ref >60)
GFR calc non Af Amer: 37 mL/min — ABNORMAL LOW (ref >60)
Glucose, Bld: 184 mg/dL — ABNORMAL HIGH (ref 70–99)
Potassium: 4.2 mmol/L (ref 3.5–5.1)
Sodium: 143 mmol/L (ref 135–145)

## 2019-05-26 LAB — CBC
HCT: 33.4 % — ABNORMAL LOW (ref 39.0–52.0)
Hemoglobin: 10.8 g/dL — ABNORMAL LOW (ref 13.0–17.0)
MCH: 29.6 pg (ref 26.0–34.0)
MCHC: 32.3 g/dL (ref 30.0–36.0)
MCV: 91.5 fL (ref 80.0–100.0)
Platelets: 195 10*3/uL (ref 150–400)
RBC: 3.65 MIL/uL — ABNORMAL LOW (ref 4.22–5.81)
RDW: 15.2 % (ref 11.5–15.5)
WBC: 11.1 10*3/uL — ABNORMAL HIGH (ref 4.0–10.5)
nRBC: 0 % (ref 0.0–0.2)

## 2019-05-26 LAB — GLUCOSE, CAPILLARY
Glucose-Capillary: 139 mg/dL — ABNORMAL HIGH (ref 70–99)
Glucose-Capillary: 154 mg/dL — ABNORMAL HIGH (ref 70–99)
Glucose-Capillary: 97 mg/dL (ref 70–99)
Glucose-Capillary: 193 mg/dL — ABNORMAL HIGH (ref 70–99)

## 2019-05-26 LAB — SARS CORONAVIRUS 2 BY RT PCR (HOSPITAL ORDER, PERFORMED IN ~~LOC~~ HOSPITAL LAB): SARS Coronavirus 2: NEGATIVE

## 2019-05-26 MED ORDER — INSULIN GLARGINE 100 UNIT/ML ~~LOC~~ SOLN
5.0000 [IU] | Freq: Every day | SUBCUTANEOUS | Status: DC
  Administered 2019-05-26: 23:00:00 5 [IU] via SUBCUTANEOUS
  Filled 2019-05-26 (×2): qty 0.05

## 2019-05-26 MED ORDER — MAGNESIUM SULFATE (LAXATIVE) PO GRAN: 10.0000 g | GRANULES | ORAL | Status: DC | PRN

## 2019-05-26 NOTE — TOC Progression Note (Addendum)
Transition of Care [facility]) - Progression Note    Patient Details  Name: NYAL SCHACHTER MRN: [id number] Date of Birth: 1931/07/24  Transition of Care [facility]) CM/SW [place], LCSW Phone Number: 05/26/2019, 9:46 AM  Clinical Narrative:    10:30am-[facility] able to accept patient and will start insurance process. CSW paged MD for updated COVID test.   9:40am-[facility] checking to see if they can accept patient. If so, they will start insurance authorization process (could take 24-48 hrs). Patient will require an updated COVID test 24-48 hrs prior to discharge. PT scheduled to work with patient today, which will be needed for insurance.    Expected Discharge Plan: [place] Barriers to Discharge: Continued Medical Work up, Ship broker  Expected Discharge Plan and Services Expected Discharge Plan: [place] Choice: [place] arrangements for the past 2 months: Apartment                                       Social Determinants of Health (SDOH) Interventions    Readmission Risk Interventions No flowsheet data found.

## 2019-05-26 NOTE — Progress Notes (Signed)
  Date: 05/26/2019  Patient name: [name]  Medical record number: [id number]  Date of birth: 05-Feb-1931   I have seen and evaluated this patient and I have discussed the plan of care with the house staff. Please see their note for complete details. I concur with their findings with the following additions/corrections:   85 year old man admitted with unresponsiveness and hypoglycemia, likely related to unintentional medication overdose.  Now doing well off of dextrose drip, slowly resuming insulin.  Discussed with Dr. Evette Doffing, his PCP, we will keep him off of liraglutide for now as he is unlikely to have significant cardiovascular benefits from it and certainly does not need weight loss.  Given his short acting insulin requirements over the last couple days, will resume a small amount of Lantus today.  Trial of Foley removal and postvoid residual today for his urinary retention.  Anticipate discharge to SNF soon when a bed is available.  [name], M.D., Ph.D. 05/26/2019, 3:14 PM

## 2019-05-26 NOTE — Discharge Summary (Addendum)
Name: [name] MRN: [id number] DOB: 16-Aug-1931 85 y.o. PCP: Axel Filler, MD  Date of Admission: 05/20/2019  5:22 PM Date of Discharge: 05/27/19 Attending Physician: Sid Falcon, MD  Discharge Diagnosis: 1. Altered mental status due to hypoglycemia 2. Urinary retention  Discharge Medications: Allergies as of 05/27/2019   No Known Allergies     Medication List    STOP taking these medications   carvedilol 12.5 MG tablet Commonly known as: COREG   Rybelsus 3 MG Tabs Generic drug: Semaglutide   Xultophy 100-3.6 UNIT-MG/ML Sopn Generic drug: Insulin Degludec-Liraglutide     TAKE these medications   Accu-Chek Aviva Plus w/Device Kit Check finger stick glucose once daily   alum & mag hydroxide-simeth 200-200-20 MG/5ML suspension Commonly known as: MAALOX/MYLANTA Take 15 mLs by mouth every 6 (six) hours as needed for indigestion or heartburn.   aspirin EC 81 MG tablet Take 81 mg by mouth daily.   atorvastatin 40 MG tablet Commonly known as: LIPITOR TAKE 1 TABLET(40 MG) BY MOUTH DAILY AT 6 PM   fluorometholone 0.1 % ophthalmic suspension Commonly known as: FML Place 2 drops into both eyes daily.   furosemide 40 MG tablet Commonly known as: LASIX Take 1 tablet (40 mg total) by mouth 2 (two) times daily.   glucose blood test strip Commonly known as: Accu-Chek Aviva Plus Check blood sugar up to 3 times a day   insulin glargine 100 UNIT/ML injection Commonly known as: LANTUS Inject 0.07 mLs (7 Units total) into the skin daily. What changed: how much to take   Insulin Pen Needle 31G X 8 MM Misc Commonly known as: B-D ULTRAFINE III SHORT PEN USE ONCE DAILY WITH LANTUS PEN   ofloxacin 0.3 % ophthalmic solution Commonly known as: OCUFLOX Place 1 drop into both eyes 4 (four) times daily.   ondansetron 4 MG disintegrating tablet Commonly known as: ZOFRAN-ODT Take 1 tablet (4 mg total) by mouth every 8 (eight) hours as needed for nausea or  vomiting.   pantoprazole 40 MG tablet Commonly known as: PROTONIX TAKE 1 TABLET(40 MG) BY MOUTH DAILY   polyethylene glycol 17 g packet Commonly known as: MIRALAX / GLYCOLAX Take 17 g by mouth daily.   senna-docusate 8.6-50 MG tablet Commonly known as: Senokot-S Take 2 tablets by mouth daily.   tamsulosin 0.4 MG Caps capsule Commonly known as: FLOMAX Take 0.8 mg by mouth daily after supper.   Ventolin HFA 108 (90 Base) MCG/ACT inhaler Generic drug: albuterol INHALE 2 PUFFS INTO THE LUNGS EVERY 6 HOURS AS NEEDED FOR WHEEZING OR SHORTNESS OF BREATH What changed: See the new instructions.       Disposition and follow-up:   [name] was discharged from [facility] in Good condition.  At the hospital follow up visit please address:  Hypoglycemia - Pt stated he was taking 20 units in the morning and evening, though was not able to name the injectable he used (lantus vs Xultophy). Weaned off D10 infusion without any further hypoglycemic episodes, and transitioned to sliding scale with meals. Discharged to SNF with regimen of 7 units lantus.   Urinary Retention/AKI - Cr 2.52 and bladder scan of 978m residual on admission. Foley placed on 8/26. Cr improved with Foley placement and gentle IV fluids. Failed voiding trial on 8/31. Going to SNF with Foley. Needs outpatient urology consult.   Malnutrition/weight loss - Weight on admission stable from one year ago. Appetite good while in the hospital, eating majority  of meals and supplement shakes. Placed on dysphagia 1 due to acute impairment on MBS and poor nutritional status.   2.  Labs / imaging needed at time of follow-up: NONE  3.  Pending labs/ test needing follow-up: NONE  Follow-up Appointments: Contact information for after-discharge care    Destination    [facility] Preferred SNF .   Service: Skilled Nursing Contact information: [address] [place]  [place] [facility] Course by problem list: 1. Altered Mental Status secondary to Hypoglycemia [name] is a 85 year old M with significant PMH of CAD, hyperlipidemia, CHF s/p ICD placement, Type II DM with retinopathy, [name] 3, and urinary retention who presented for altered mental status. Afebrile, no leukocytosis. CT head negative for acute abnormalities. UA with small leukocyte esterase, negative nitrites, and few bacteria.   His glucose on presentation was 43. Blood sugar initially responded to amps of D50, however the pt continued to struggle with hypoglycemia after admission and was begun on a D10 infusion on 8/26. The drip was titrated off on the evening of 8/27, and he had no more hypoglycemic episodes during the hospitalization. Sliding scale mealtime and HS insulin was restarted on 8/28. Pt required 15 units on 8/29, and 10 units on 8/30. On 8/31, 5 units lantus HS with sliding scale was begun. Pt was discharged to SNF with an insulin regimen of 7 units Lantus.  Of note, pt's outpatient insulin regiment was previously Lantus34 units daily but decreased to 20 units daily per note on 6/18, and then discontinued for initiation of insulin degludec-liraglutide (Xultophy)20 units on 7/10.Pt stating he takes 20 units twice a day, though could not name or identify what injectable his was using.  2. Urinary Retention and AKI Bladder scan on admission in the ED showed 911m residual. UA, collected from Foley bag, significant for large Hgb, >50 RBCs, 100 protein, >50 WBCs, and few bacteria. Hematuria thought to be secondary to trauma from Foley due to BPH. No clots seen in the Foley bag, Hgb stable, and gross hematuria resolved on day 2 of hospitalization. Uncertain if pt was taking his prescribed tamsulosin at home. Tamsulosin 0.'8mg'$  daily was continued throughout the hospitalization  Cr was elevated to 2.52 on admission. After Foley insertion, Cr mildly improved 2.52 >  2.26 > 2.33. Given slow 1L bolus over the course of the day on 8/27. Cr then continued to improve 2.33 > 2.17 > 1.81 > 1.65. Decline in renal function attributed to BPH and dehydration as it improved initially with Foley and then again with fluid bolus. Foley was kept to allow time for bladder to decompress and to avoid repetitive insertions as it is likely traumatic and difficult to place because of pt's BPH. Voiding trial attempted on 8/31. Patient failed voiding trial and Foley placed on 9/1.   Discharge Vitals:   BP (!) 144/76   Pulse 94   Temp 98 F (36.7 C) (Oral)   Resp 18   Ht '5\' 8"'$  (1.727 m) Comment: 08/06/17  Wt 54.5 kg   SpO2 100%   BMI 18.27 kg/m   Pertinent Labs, Studies, and Procedures:  CBC Latest Ref Rng & Units 05/26/2019 05/25/2019 05/24/2019  WBC 4.0 - 10.5 K/uL 11.1(H) 12.2(H) 12.6(H)  Hemoglobin 13.0 - 17.0 g/dL 10.8(L) 10.9(L) 11.4(L)  Hematocrit 39.0 - 52.0 % 33.4(L) 33.8(L) 34.8(L)  Platelets 150 - 400 K/uL 195 173 155  BMP Latest Ref Rng & Units 05/26/2019 05/25/2019 05/24/2019  Glucose 70 - 99 mg/dL 184(H) 162(H) 168(H)  BUN 8 - 23 mg/dL 38(H) 43(H) 44(H)  Creatinine 0.61 - 1.24 mg/dL 1.66(H) 1.65(H) 1.81(H)  BUN/Creat Ratio 10 - 24 - - -  Sodium 135 - 145 mmol/L 143 141 138  Potassium 3.5 - 5.1 mmol/L 4.2 4.0 3.9  Chloride 98 - 111 mmol/L 110 107 106  CO2 22 - 32 mmol/L '26 26 24  '$ Calcium 8.9 - 10.3 mg/dL 9.3 9.0 9.0   CBG (last 3)  Recent Labs    05/26/19 2119 05/27/19 0750 05/27/19 1221  GLUCAP 193* 97 141*   Urinalysis    Component Value Date/Time   COLORURINE AMBER (A) 05/21/2019 1523   APPEARANCEUR CLOUDY (A) 05/21/2019 1523   LABSPEC 1.013 05/21/2019 1523   PHURINE 5.0 05/21/2019 1523   GLUCOSEU NEGATIVE 05/21/2019 1523   GLUCOSEU 500 (A) 05/12/2009 2130   HGBUR LARGE (A) 05/21/2019 1523   BILIRUBINUR NEGATIVE 05/21/2019 1523   KETONESUR NEGATIVE 05/21/2019 1523   PROTEINUR 100 (A) 05/21/2019 1523   UROBILINOGEN 1.0 01/16/2015 1630    NITRITE NEGATIVE 05/21/2019 1523   LEUKOCYTESUR SMALL (A) 05/21/2019 1523   Results for orders placed or performed during the hospital encounter of 05/20/19  SARS CORONAVIRUS 2 (TAT 6-12 HRS) Nasal Swab Aptima Multi Swab     Status: None   Collection Time: 05/20/19  8:44 PM   Specimen: Aptima Multi Swab; Nasal Swab  Result Value Ref Range Status   SARS Coronavirus 2 NEGATIVE NEGATIVE Final    Comment: (NOTE) SARS-CoV-2 target nucleic acids are NOT DETECTED. The SARS-CoV-2 RNA is generally detectable in upper and lower respiratory specimens during the acute phase of infection. Negative results do not preclude SARS-CoV-2 infection, do not rule out co-infections with other pathogens, and should not be used as the sole basis for treatment or other patient management decisions. Negative results must be combined with clinical observations, patient history, and epidemiological information. The expected result is Negative. Fact Sheet for Patients: SugarRoll.be Fact Sheet for Healthcare Providers: https://www.woods-mathews.com/ This test is not yet approved or cleared by the [place] FDA and  has been authorized for detection and/or diagnosis of SARS-CoV-2 by FDA under an Emergency Use Authorization (EUA). This EUA will remain  in effect (meaning this test can be used) for the duration of the COVID-19 declaration under Section 56 4(b)(1) of the Act, 21 U.S.C. section 360bbb-3(b)(1), unless the authorization is terminated or revoked sooner. Performed at [facility], [place] [address] [place], [place] [address]   MRSA PCR Screening     Status: None   Collection Time: 05/21/19  9:53 AM   Specimen: Nasopharyngeal  Result Value Ref Range Status   MRSA by PCR NEGATIVE NEGATIVE Final    Comment:        The GeneXpert MRSA Assay (FDA approved for NASAL specimens only), is one component of a comprehensive MRSA colonization surveillance  program. It is not intended to diagnose MRSA infection nor to guide or monitor treatment for MRSA infections. Performed at [facility], [place] [address] [place], [place] [address]   Culture, Urine     Status: Abnormal   Collection Time: 05/21/19  5:04 PM   Specimen: Urine, Random  Result Value Ref Range Status   Specimen Description URINE, RANDOM  Final   Special Requests   Final    NONE Performed at [facility], [place],  [place] [address]    Culture 20,000 COLONIES/mL ENTEROCOCCUS FAECALIS (A)  Final   Report Status 05/23/2019 FINAL  Final   Organism ID, Bacteria ENTEROCOCCUS FAECALIS (A)  Final      Susceptibility   Enterococcus faecalis - MIC*    AMPICILLIN <=2 SENSITIVE Sensitive     LEVOFLOXACIN 1 SENSITIVE Sensitive     NITROFURANTOIN <=16 SENSITIVE Sensitive     VANCOMYCIN 1 SENSITIVE Sensitive     * 20,000 COLONIES/mL ENTEROCOCCUS FAECALIS  SARS Coronavirus 2 [facility] order, Performed in [facility] hospital lab) Nasopharyngeal Nasopharyngeal Swab     Status: None   Collection Time: 05/26/19  2:40 PM   Specimen: Nasopharyngeal Swab  Result Value Ref Range Status   SARS Coronavirus 2 NEGATIVE NEGATIVE Final    Comment: (NOTE) If result is NEGATIVE SARS-CoV-2 target nucleic acids are NOT DETECTED. The SARS-CoV-2 RNA is generally detectable in upper and lower  respiratory specimens during the acute phase of infection. The lowest  concentration of SARS-CoV-2 viral copies this assay can detect is 250  copies / mL. A negative result does not preclude SARS-CoV-2 infection  and should not be used as the sole basis for treatment or other  patient management decisions.  A negative result may occur with  improper specimen collection / handling, submission of specimen other  than nasopharyngeal swab, presence of viral mutation(s) within the  areas targeted by this assay, and inadequate number of viral copies  (<250 copies / mL). A negative result  must be combined with clinical  observations, patient history, and epidemiological information. If result is POSITIVE SARS-CoV-2 target nucleic acids are DETECTED. The SARS-CoV-2 RNA is generally detectable in upper and lower  respiratory specimens dur ing the acute phase of infection.  Positive  results are indicative of active infection with SARS-CoV-2.  Clinical  correlation with patient history and other diagnostic information is  necessary to determine patient infection status.  Positive results do  not rule out bacterial infection or co-infection with other viruses. If result is PRESUMPTIVE POSTIVE SARS-CoV-2 nucleic acids MAY BE PRESENT.   A presumptive positive result was obtained on the submitted specimen  and confirmed on repeat testing.  While 2019 novel coronavirus  (SARS-CoV-2) nucleic acids may be present in the submitted sample  additional confirmatory testing may be necessary for epidemiological  and / or clinical management purposes  to differentiate between  SARS-CoV-2 and other Sarbecovirus currently known to infect humans.  If clinically indicated additional testing with an alternate test  methodology [phone]) is advised. The SARS-CoV-2 RNA is generally  detectable in upper and lower respiratory sp ecimens during the acute  phase of infection. The expected result is Negative. Fact Sheet for Patients:  StrictlyIdeas.no Fact Sheet for Healthcare Providers: BankingDealers.co.za This test is not yet approved or cleared by the [place] FDA and has been authorized for detection and/or diagnosis of SARS-CoV-2 by FDA under an Emergency Use Authorization (EUA).  This EUA will remain in effect (meaning this test can be used) for the duration of the COVID-19 declaration under Section 564(b)(1) of the Act, 21 U.S.C. section 360bbb-3(b)(1), unless the authorization is terminated or revoked sooner. Performed at [facility], [place] [address], [place], [place] [address]    CXR 05/20/2019 CLINICAL DATA:  Confusion.  EXAM: PORTABLE CHEST 1 VIEW  COMPARISON:  Radiographs of Jan 27, 2016.  FINDINGS: The heart size and mediastinal contours are within normal limits. Left-sided pacemaker is unchanged in position. No pneumothorax or  pleural effusion is noted. Left midlung and right basilar opacities are noted concerning for possible pneumonia. The visualized skeletal structures are unremarkable.  IMPRESSION: Bilateral lung opacities as described above, concerning for pneumonia. Followup PA and lateral chest X-ray is recommended in 3-4 weeks following trial of antibiotic therapy to ensure resolution and exclude underlying malignancy.  CT Head 05/20/2019 CLINICAL DATA:  85 year old male with altered mental status.  EXAM: CT HEAD WITHOUT CONTRAST  TECHNIQUE: Contiguous axial images were obtained from the base of the skull through the vertex without intravenous contrast.  COMPARISON:  Head CT dated 04/21/2017  FINDINGS: Brain: Mild to moderate age-related atrophy and chronic microvascular ischemic changes. Faint subcentimeter hypodense focus in the left basal ganglia similar to prior CT, likely an old lacunar infarct. There is no acute intracranial hemorrhage. No mass effect or midline shift. A small lenticular appearing dural based lesion from the right frontal calvarium (series 2, image 24) is suboptimally characterized but was present on the CT of 2018 most likely a meningioma. This measures approximately 5 x 13 mm, slightly increased since the prior CT. No extra-axial fluid collection.  Vascular: No hyperdense vessel or unexpected calcification.  Skull: Normal. Negative for fracture or focal lesion.  Sinuses/Orbits: No acute finding.  Other: None  IMPRESSION: 1. No acute intracranial hemorrhage. 2. Mild to moderate age-related atrophy and chronic microvascular ischemic  changes. 3. Probable small right frontal meningioma, slightly increased in size compared to the prior CT.  Modified Barium Swallow 05/21/2019 Clinical Impression - Pt is an 85 year old male admitted for a hypoglycemic episode, but also found to have bialteral infiltrates. Instrumental assessment revealed significant oropharyngeal dysphagia with oral deficits incuding piecemeal swallowing due to decreased bolus cohesion. Transit at times can be more passive than active and thus timing of initiation is inconsistent and leads to decreased swallow efficiency. There is also decreased base of tongue retraction and pharyngeal peristalsis. There is typically severe vallecular and pyriform residue post swallow with frank penetration at times with pt sensation. Throat clearing and multiple swallows are part of pts baseline swallowing compensations to protect airway. Despite this, given the quantity of residue, particularly after solids are given, there are sensed aspiration events with thin liquids. It appears that pt tolerates an unmodified diet, but given acute impairment, pna and signs of deconditioning and poor nutrition, will modify diet to puree and nectar thick liquids to reduce aspiration and also reduce severity of pharyngeal residue during admission.  A chin tuck was highly beneficial in reducing vallecular residue. Recommend pt tuck his chin and sips from a straw.    Discharge Instructions: Discharge Instructions    Call MD for:  difficulty breathing, headache or visual disturbances   Complete by: As directed    Call MD for:  extreme fatigue   Complete by: As directed    Call MD for:  hives   Complete by: As directed    Call MD for:  persistant dizziness or light-headedness   Complete by: As directed    Call MD for:  persistant nausea and vomiting   Complete by: As directed    Call MD for:  redness, tenderness, or signs of infection (pain, swelling, redness, odor or green/yellow discharge around  incision site)   Complete by: As directed    Call MD for:  severe uncontrolled pain   Complete by: As directed    Call MD for:  temperature >100.4   Complete by: As directed    Diet - low sodium  heart healthy   Complete by: As directed    Discharge instructions   Complete by: As directed    Mr. Doell,  I am glad that you are feeling better.  You were admitted for some confusion found to be due to a low blood sugar levels.  This may be due to your diabetes medications.   We have made the following adjustments:  -Discontinue semaglutide -Discontinue Xultophy -Change the dose of Lantus from 20 units daily to 7 units daily -Continue to monitor blood sugar levels -Please keep a close eye on your blood sugar levels and if they go <60 please contact your physician  Please stop taking your carvedilol for now, your blood pressures were normal while admitted.  Follow-up with your primary care physician to see if this needs to be restarted. You have BPH and have a foley catheter in place, you will need follow-up with urology for treatment   Increase activity slowly   Complete by: As directed       Signed:  Tamsen Snider, MD PGY1  [phone]

## 2019-05-26 NOTE — Progress Notes (Signed)
Subjective: Pt seen at the bedside this morning. Feeling well, and requesting for a warm bath to soak his feet. Eating breakfast endorsing a good appetite. Denies any acute concerns.   Objective:  Vital signs in last 24 hours: Vitals:   05/25/19 1748 05/25/19 2055 05/26/19 0540 05/26/19 0553  BP: (!) 127/59 (!) 122/49 127/68   Pulse: 78 70 76   Resp: 16 18 16    Temp: 98.2 F (36.8 C) 98 F (36.7 C) 98.6 F (37 C)   TempSrc: Oral Oral Oral   SpO2: 100% 99% 98%   Weight:    61 kg  Height:       Physical Exam Vitals signs and nursing note reviewed.  Constitutional:      General: He is not in acute distress.    Appearance: He is cachectic.  Cardiovascular:     Rate and Rhythm: Normal rate and regular rhythm.     Pulses: Normal pulses.  Pulmonary:     Effort: Pulmonary effort is normal. No respiratory distress.     Breath sounds: Normal breath sounds.  Abdominal:     General: Abdomen is flat. Bowel sounds are normal.  Musculoskeletal:     Right lower leg: No edema.     Left lower leg: No edema.  Skin:    Comments: Feet quite dry. Toenails overgrown. No gross deformities, skin breakdown, or erythema.   Neurological:     Mental Status: He is alert.     Assessment/Plan: Mr. Edmiston is a 85 year old M with significant PMH of CAD, hyperlipidemia, CHF s/p ICD placement, Type II DM with retinopathy, [name] 3, and urinary retention who presented for altered mental status likely from hypoglycemia attributed to insulin overdose at home.   Altered mental status Hypoglycemia/Type 2 diabetes - A1c 8.1 on 02/2019 Hypoglycemic withblood sugar 43 on presentation. Blood sugar initially responded to amps of D50 however continued to struggle with hypoglycemia so transitioned to D10 infusion. CT head negative for acute changes. Pt previously on Lantus 34 units daily but decreased to 20 units daily per note on 6/18, and then discontinued for initiation of insulin degludec-liraglutide (Xultophy)  20 units on 7/10. Pt stating he takes 20 units twice a day, though still unclear what injectable his is using. - no further hypoglycemic episodes in the hospital Insulin regimen   - sliding scale - HS and mealtime sensitive  - 3 units novolog with meals - will need education at discharge on appropriate diabetes regimen   Weight loss - malnutrition vs malignancy ? being followed outpatient Weight 128lbs (2018) > 131(2019) > 124 (02/2019) - good appetite and eating meals here with supplement shakes - can continue to follow outpatient - PT recommending SNF and pt agreeable at this time, consult to social work placed - pt can bathe feet in warm bath with epsom salts    Urinary retention - Foley placed 8/25 History of obstruction in 2017 thought to be secondary to BPH. UA with blood and no nitrites or bacteria.Unclear if pt taking tamsulosin at home. Chronically elevated PSA though uptrending to 10.5 in 2019. Gross hematuria now resolved. - will remove Foley today and attempt voiding trials and bladder scans - may need to consider urology consultor further imaging   Acute onChronic kidney disease stage III - ?baseline, Cr 1.8 04/2019 Cr 2.52 on admission. Decline in renal function on admission likely secondary to urinary retention and dehydration as it improved initially with Foley and fluid bolus  - continues  to improve to 1.66 today - continue to monitoring with BMPs   Systolic heart failures/p ICD placement 2013 Echo in 2017 with EF 30-35% and mild stenosis of aortic valve. - pt appears to be euvolemic   Coronary artery disease - 3 vessel CAD s/p stent placement in mid circumflex 2004 and s/p drug eluding stent placement 2005 Cath in 2017 with 40% stenosis of circumflex, LAD occluded chronically with collaterals present, and 50% stenosis of mid RCA.  Hyperlipidemia. - continue lipitor 40mg  PO daily  HTN  - holding home antihypertensives (carvedilol12.5mg  BID and  Lasix 40mg  BID)   Diet - dysphagia 1 - nectar thick liquids Fluids - none DVT ppx -heparin 5000 units subQ q8h CODE STATUS -FULL CODE   Dispo: Anticipated discharge pending SNF placement    Ladona Horns, MD 05/26/2019, 6:44 AM Pager: [phone]

## 2019-05-26 NOTE — Progress Notes (Signed)
Physical Therapy Treatment Patient Details Name: [name] MRN: [id number] DOB: 19-Mar-1931 Today's Date: 05/26/2019    History of Present Illness This is a 85 year old male with a past medical history significant for coronary artery disease, hyperlipidemia, chronic systolic heart failure (status post ICD placement), type 2 diabetes with associated retinopathy, [name] 3, urinary retention. Patient arrived to the ED via EMS due to hypoglycemia.  CXR opacities in left middle lobe and right base that could be consistent with pneumonia.    PT Comments    Pt was able to walk a short trip with cues for pacing and safety, then O2 sats were checked with pt demonstrating 97% after walk on room air.  He is however unsafe alone, tending to turn with wide base and having a stumbly presentation.  He is weak at the knees, and worked on LE resisted ex after walk to reinforce mm control there.  Follow acutely for these needs and progress as tolerated to shorten the need to stay in rehab.   Follow Up Recommendations  SNF;Supervision/Assistance - 24 hour;Other (comment)     Equipment Recommendations  None recommended by PT    Recommendations for Other Services OT consult     Precautions / Restrictions Precautions Precautions: Fall Restrictions Weight Bearing Restrictions: No    Mobility  Bed Mobility Overal bed mobility: Needs Assistance             General bed mobility comments: up in chair when PT arrived  Transfers Overall transfer level: Needs assistance Equipment used: Rolling walker (2 wheeled) Transfers: Sit to/from Stand Sit to Stand: Min assist         General transfer comment: needs cues for all transitions for hand placement  Ambulation/Gait Ambulation/Gait assistance: Min guard Gait Distance (Feet): 80 Feet Assistive device: Rolling walker (2 wheeled) Gait Pattern/deviations: Step-through pattern;Decreased stride length;Wide base of support;Shuffle Gait velocity:  reduced Gait velocity interpretation: <1.8 ft/sec, indicate of risk for recurrent falls General Gait Details: pt is not demonstrating an awareness of his limits of endurance   Stairs             Wheelchair Mobility    Modified Rankin (Stroke Patients Only)       Balance Overall balance assessment: Needs assistance Sitting-balance support: Feet supported Sitting balance-Leahy Scale: Fair     Standing balance support: Bilateral upper extremity supported Standing balance-Leahy Scale: Poor Standing balance comment: requires help to sit forward on the chair                            Cognition Arousal/Alertness: Awake/alert Behavior During Therapy: WFL for tasks assessed/performed Overall Cognitive Status: No family/caregiver present to determine baseline cognitive functioning                                 General Comments: pt is not following PT questions well but states he can hear fine      Exercises General Exercises - Lower Extremity Ankle Circles/Pumps: AROM;AAROM;Both;5 reps Long Arc Quad: Strengthening;Both;10 reps Heel Slides: Strengthening;Both;10 reps    General Comments General comments (skin integrity, edema, etc.): pt is having a struggle to walk with RW, requires help to set limits and to be aware of his SOB and sats      Pertinent Vitals/Pain Pain Assessment: No/denies pain    Home Living  Prior Function            PT Goals (current goals can now be found in the care plan section) Acute Rehab PT Goals Patient Stated Goal: to walk a little, states he can hear Progress towards PT goals: Progressing toward goals    Frequency    Min 2X/week      PT Plan Current plan remains appropriate    Co-evaluation              AM-PAC PT "6 Clicks" Mobility   Outcome Measure  Help needed turning from your back to your side while in a flat bed without using bedrails?: None Help  needed moving from lying on your back to sitting on the side of a flat bed without using bedrails?: A Little Help needed moving to and from a bed to a chair (including a wheelchair)?: A Little Help needed standing up from a chair using your arms (e.g., wheelchair or bedside chair)?: A Little Help needed to walk in hospital room?: A Little Help needed climbing 3-5 steps with a railing? : A Lot 6 Click Score: 18    End of Session Equipment Utilized During Treatment: Gait belt Activity Tolerance: Patient tolerated treatment well;Patient limited by fatigue;Treatment limited secondary to medical complications (Comment) Patient left: in chair;with call bell/phone within reach;with chair alarm set Nurse Communication: Mobility status PT Visit Diagnosis: Unsteadiness on feet (R26.81);Other abnormalities of gait and mobility (R26.89);Muscle weakness (generalized) (M62.81)     Time: 6734-1937 PT Time Calculation (min) (ACUTE ONLY): 26 min  Charges:  $Gait Training: 8-22 mins $Therapeutic Exercise: 8-22 mins                   Ramond Dial 05/26/2019, 5:27 PM   Mee Hives, PT MS Acute Rehab Dept. Number: [place] and [place]

## 2019-05-27 DIAGNOSIS — N401 Enlarged prostate with lower urinary tract symptoms: Secondary | ICD-10-CM | POA: Diagnosis not present

## 2019-05-27 DIAGNOSIS — L97819 Non-pressure chronic ulcer of other part of right lower leg with unspecified severity: Secondary | ICD-10-CM | POA: Diagnosis not present

## 2019-05-27 DIAGNOSIS — N179 Acute kidney failure, unspecified: Secondary | ICD-10-CM | POA: Diagnosis not present

## 2019-05-27 DIAGNOSIS — I2510 Atherosclerotic heart disease of native coronary artery without angina pectoris: Secondary | ICD-10-CM | POA: Diagnosis not present

## 2019-05-27 DIAGNOSIS — R634 Abnormal weight loss: Secondary | ICD-10-CM | POA: Diagnosis not present

## 2019-05-27 DIAGNOSIS — K219 Gastro-esophageal reflux disease without esophagitis: Secondary | ICD-10-CM | POA: Diagnosis not present

## 2019-05-27 DIAGNOSIS — M2550 Pain in unspecified joint: Secondary | ICD-10-CM | POA: Diagnosis not present

## 2019-05-27 DIAGNOSIS — E161 Other hypoglycemia: Secondary | ICD-10-CM | POA: Diagnosis not present

## 2019-05-27 DIAGNOSIS — E11319 Type 2 diabetes mellitus with unspecified diabetic retinopathy without macular edema: Secondary | ICD-10-CM | POA: Diagnosis not present

## 2019-05-27 DIAGNOSIS — E785 Hyperlipidemia, unspecified: Secondary | ICD-10-CM | POA: Diagnosis not present

## 2019-05-27 DIAGNOSIS — Z7401 Bed confinement status: Secondary | ICD-10-CM | POA: Diagnosis not present

## 2019-05-27 DIAGNOSIS — R339 Retention of urine, unspecified: Secondary | ICD-10-CM | POA: Diagnosis not present

## 2019-05-27 DIAGNOSIS — E11649 Type 2 diabetes mellitus with hypoglycemia without coma: Secondary | ICD-10-CM | POA: Diagnosis not present

## 2019-05-27 DIAGNOSIS — N183 Chronic kidney disease, stage 3 (moderate): Secondary | ICD-10-CM | POA: Diagnosis not present

## 2019-05-27 DIAGNOSIS — I5020 Unspecified systolic (congestive) heart failure: Secondary | ICD-10-CM | POA: Diagnosis not present

## 2019-05-27 DIAGNOSIS — R4182 Altered mental status, unspecified: Secondary | ICD-10-CM | POA: Diagnosis not present

## 2019-05-27 DIAGNOSIS — I5022 Chronic systolic (congestive) heart failure: Secondary | ICD-10-CM | POA: Diagnosis not present

## 2019-05-27 DIAGNOSIS — R1312 Dysphagia, oropharyngeal phase: Secondary | ICD-10-CM | POA: Diagnosis not present

## 2019-05-27 DIAGNOSIS — M6281 Muscle weakness (generalized): Secondary | ICD-10-CM | POA: Diagnosis not present

## 2019-05-27 DIAGNOSIS — Z681 Body mass index (BMI) 19 or less, adult: Secondary | ICD-10-CM

## 2019-05-27 DIAGNOSIS — E1139 Type 2 diabetes mellitus with other diabetic ophthalmic complication: Secondary | ICD-10-CM | POA: Diagnosis not present

## 2019-05-27 DIAGNOSIS — E1122 Type 2 diabetes mellitus with diabetic chronic kidney disease: Secondary | ICD-10-CM | POA: Diagnosis not present

## 2019-05-27 DIAGNOSIS — I10 Essential (primary) hypertension: Secondary | ICD-10-CM | POA: Diagnosis not present

## 2019-05-27 DIAGNOSIS — I130 Hypertensive heart and chronic kidney disease with heart failure and stage 1 through stage 4 chronic kidney disease, or unspecified chronic kidney disease: Secondary | ICD-10-CM | POA: Diagnosis not present

## 2019-05-27 DIAGNOSIS — I509 Heart failure, unspecified: Secondary | ICD-10-CM | POA: Diagnosis not present

## 2019-05-27 DIAGNOSIS — N390 Urinary tract infection, site not specified: Secondary | ICD-10-CM | POA: Diagnosis not present

## 2019-05-27 DIAGNOSIS — E162 Hypoglycemia, unspecified: Secondary | ICD-10-CM | POA: Diagnosis not present

## 2019-05-27 LAB — GLUCOSE, CAPILLARY
Glucose-Capillary: 97 mg/dL (ref 70–99)
Glucose-Capillary: 141 mg/dL — ABNORMAL HIGH (ref 70–99)

## 2019-05-27 MED ORDER — ENSURE ENLIVE PO LIQD
237.0000 mL | Freq: Two times a day (BID) | ORAL | Status: DC
  Administered 2019-05-27: 237 mL via ORAL

## 2019-05-27 MED ORDER — INSULIN GLARGINE 100 UNIT/ML ~~LOC~~ SOLN: 7.0000 [IU] | Freq: Every day | SUBCUTANEOUS | 11 refills | Status: DC

## 2019-05-27 NOTE — Progress Notes (Signed)
Subjective: Pt up in the chair this morning. Feeling well except for his feet. He would like a foot soak and a toenail trim.    Objective:  Vital signs in last 24 hours: Vitals:   05/27/19 0004 05/27/19 0416 05/27/19 0425 05/27/19 0749  BP: 115/82 126/69  140/72  Pulse: 82 82  83  Resp:    16  Temp: 99.1 F (37.3 C) 97.8 F (36.6 C)  98.7 F (37.1 C)  TempSrc: Oral Oral  Oral  SpO2: 98% 99%  98%  Weight:   54.5 kg   Height:       Physical Exam Vitals signs and nursing note reviewed.  Constitutional:      General: He is not in acute distress.    Appearance: He is cachectic.  Cardiovascular:     Rate and Rhythm: Normal rate and regular rhythm.     Pulses: Normal pulses.  Pulmonary:     Effort: Pulmonary effort is normal. No respiratory distress.     Breath sounds: Normal breath sounds.  Abdominal:     General: Abdomen is flat. Bowel sounds are normal.  Musculoskeletal:     Right lower leg: No edema.     Left lower leg: No edema.  Skin:    Comments: Feet quite dry. Toenails overgrown. No gross deformities, skin breakdown, or erythema.   Neurological:     Mental Status: He is alert and oriented to person, place, and time.     Assessment/Plan: [name] is a 85 year old M with significant PMH of CAD, hyperlipidemia, CHF s/p ICD placement, Type II DM with retinopathy, [name] 3, and urinary retention who presented for altered mental status likely from hypoglycemia attributed to insulin overdose at home.   Altered mental status Hypoglycemia/Type 2 diabetes - A1c 8.1 on 02/2019 Hypoglycemic withblood sugar 43 on presentation. Blood sugar initially responded to amps of D50 however continued to struggle with hypoglycemia so transitioned to D10 infusion. CT head negative for acute changes. Pt previously on Lantus 34 units daily but decreased to 20 units daily per note on 6/18, and then discontinued for initiation of insulin degludec-liraglutide (Xultophy) 20 units on 7/10. Pt  stating he takes 20 units twice a day, though still unclear what injectable his is using.  No further hypoglycemic episodes in hospital. Patient on sliding scale HS and mealtime. Started on Lantus 5 units on 8/31. Blood glucose 141 on day of discharge to SNF. Will send patient out on 7 units Lantus.   Urinary retention - Foley placed 8/25 History of obstruction in 2017 thought to be secondary to BPH. UA with blood and no nitrites or bacteria.Unclear if pt taking tamsulosin at home. Chronically elevated PSA though uptrending to 10.5 in 2019. Gross hematuria now resolved. Removed Foley 8/31 and failed voiding trial.  - Place Foley  - outpatient urology follow up  Acute onChronic kidney disease stage III - ?baseline, Cr 1.8 04/2019 Cr 2.52 on admission.   Decline in renal function on admission likely secondary to urinary retention and dehydration as it improved with Foley and fluids.  Systolic heart failures/p ICD placement 2013 Echo in 2017 with EF 30-35% and mild stenosis of aortic valve.  Coronary artery disease - 3 vessel CAD s/p stent placement in mid circumflex 2004 and s/p drug eluding stent placement 2005 Cath in 2017 with 40% stenosis of circumflex, LAD occluded chronically with collaterals present, and 50% stenosis of mid RCA.  Hyperlipidemia. - continue lipitor 40mg  PO daily  HTN  - holding home antihypertensives (carvedilol12.5mg  BID and Lasix 40mg  BID)   Diet - dysphagia 1 - nectar thick liquids Fluids - none DVT ppx -heparin 5000 units subQ q8h CODE STATUS -FULL CODE   Dispo: Anticipated discharge today to SNF   Tamsen Snider, MD PGY1  [phone]

## 2019-05-27 NOTE — Discharge Instructions (Signed)
[name],  I am glad that you are feeling better.  You were admitted for some confusion found to be due to a low blood sugar levels.  This may be due to your diabetes medications.   We have made the following adjustments:  -Discontinue semaglutide -Discontinue Xultophy -Change the dose of Lantus from 20 units daily to 7 units daily -Continue to monitor blood sugar levels -Please keep a close eye on your blood sugar levels and if they go <60 please contact your physician  Please stop taking your carvedilol for now, your blood pressures were normal while admitted.  Follow-up with your primary care physician to see if this needs to be restarted. You have BPH and have a foley catheter in place, you will need follow-up with urology for treatment

## 2019-05-27 NOTE — Progress Notes (Signed)
  Speech Language Pathology Treatment: Dysphagia  Patient Details Name: [name] MRN: [id number] DOB: 17-Nov-1930 Today's Date: 05/27/2019 Time: 4496-7591 SLP Time Calculation (min) (ACUTE ONLY): 10 min  Assessment / Plan / Recommendation Clinical Impression  Pt exhibited immediate wet cough with initial sip of nectar thick liquid.  Pt did not recall use of chin tuck.  SLP asked if pt recalled strategies, which he endorsed prior to initial sip.  Following verbal instruction to use chin tuck, pt continued use throughout session.  Intermittent throat clear/wet exhalation sounds with nectar thick liquid noted.  Pt tolerated puree with no clinical s/s of aspiration.  Recommend continuing puree diet with nectar thick liquid with use of chin tuck. Please verbally cue pt to utilize chin tuck prior to giving liquids.    HPI HPI: This is a 85 year old male with a past medical history significant for coronary artery disease, hyperlipidemia, chronic systolic heart failure (status post ICD placement), type 2 diabetes with associated retinopathy, [name] 3, urinary retention. Patient arrived to the ED via EMS due to hypoglycemia.  CXR opacities in left middle lobe and right base that could be consistent with pneumonia.  Formal read suggests follow-up after opacities clear up to rule out mass      SLP Plan  Continue with current plan of care       Recommendations  Diet recommendations: Dysphagia 1 (puree);Nectar-thick liquid Liquids provided via: Straw Medication Administration: Whole meds with liquid Supervision: Intermittent supervision to cue for compensatory strategies Compensations: Slow rate;Small sips/bites;Multiple dry swallows after each bite/sip;Follow solids with liquid;Clear throat intermittently;Effortful swallow;Chin tuck Postural Changes and/or Swallow Maneuvers: Seated upright 90 degrees                Oral Care Recommendations: Oral care BID Follow up Recommendations: Skilled  Nursing facility SLP Visit Diagnosis: Dysphagia, unspecified (R13.10) Plan: Continue with current plan of care       [place], [place], [place] Office: [phone] 05/27/2019, 12:58 PM

## 2019-05-27 NOTE — TOC Progression Note (Signed)
Transition of Care [facility]) - Progression Note    Patient Details  Name: [name] MRN: [id number] Date of Birth: [birth year]  Transition of Care [facility]) CM/SW [place], LCSW Phone Number: 05/27/2019, 2:17 PM  Clinical Narrative:    Kathleen Argue [facility] has received insurance approval for discharge today. Patient aware.    Expected Discharge Plan: [place] Barriers to Discharge: Continued Medical Work up, Ship broker  Expected Discharge Plan and Services Expected Discharge Plan: [place] Choice: [place] arrangements for the past 2 months: Apartment Expected Discharge Date: 05/27/19                                     Social Determinants of Health (SDOH) Interventions    Readmission Risk Interventions No flowsheet data found.

## 2019-05-27 NOTE — Progress Notes (Signed)
Patient discharged to [facility] report Called to nurse [place]. AVS printed and sent with patient. Discharged via [place].

## 2019-05-27 NOTE — Care Management Important Message (Signed)
Important Message  Patient Details  Name: [name] MRN: [id number] Date of Birth: 10/11/1930   Medicare Important Message Given:  Yes     [name] 05/27/2019, 2:06 PM

## 2019-05-27 NOTE — Progress Notes (Signed)
Patient has been unable to void since foley cathter removal on day shift.Previous shift In and out cath patient for 500 cc of urine prior to shift change report.Patient recently bladder scanned shows greater than 346 called Dr. Darrick Meigs. Dr. Darrick Meigs does not want patient in and out cath this shift.Per MD plan to wait and let day team handle in morning.

## 2019-05-27 NOTE — Progress Notes (Signed)
  Date: 05/27/2019  Patient name: [name]  Medical record number: [id number]  Date of birth: 09/27/1930        I have seen and evaluated this patient and I have discussed the plan of care with the house staff. Please see Dr. Lonzo Candy note for complete details. I concur with his findings and plan.   Planned discharge today.  Needs outpatient urology follow up to address foley.    Sid Falcon, MD 05/27/2019, 8:36 PM

## 2019-05-27 NOTE — TOC Progression Note (Signed)
Transition of Care [facility]) - Progression Note    Patient Details  Name: [name] MRN: [id number] Date of Birth: 10-30-1930  Transition of Care [facility]) CM/SW [place], LCSW Phone Number: 05/27/2019, 9:02 AM  Clinical Narrative:    CSW sent updated PT note and negative COVID test to [facility]. They are still waiting on insurance approval.    Expected Discharge Plan: [place] Barriers to Discharge: Continued Medical Work up, Ship broker  Expected Discharge Plan and Services Expected Discharge Plan: [place] Choice: [place] arrangements for the past 2 months: Apartment                                       Social Determinants of Health (SDOH) Interventions    Readmission Risk Interventions No flowsheet data found.

## 2019-05-27 NOTE — TOC Transition Note (Signed)
Transition of Care [facility]) - CM/SW Discharge Note   Patient Details  Name: [name] MRN: [id number] Date of Birth: [birth year]  Transition of Care [facility]) CM/SW Contact:  Benard Halsted, LCSW Phone Number: 05/27/2019, 2:18 PM   Clinical Narrative:    Patient will DC to: [facility] Anticipated DC date: 05/27/19 Family notified: Patient declined Transport by: Corey Harold   Per MD patient ready for DC to [facility]. RN, patient, patient's family, and facility notified of DC. Discharge Summary and FL2 sent to facility. RN to call report prior to discharge [phone] Room 118p). DC packet on chart. Ambulance transport requested for patient.   CSW will sign off for now as social work intervention is no longer needed. Please consult [place] again if new needs arise.  Cedric Fishman, LCSW Clinical Social Worker [phone]    Final next level of care: Skilled Nursing Facility Barriers to Discharge: No Barriers Identified   Patient Goals and CMS Choice Patient states their goals for this hospitalization and ongoing recovery are:: to get to rehab soon CMS Medicare.gov Compare Post Acute Care list provided to:: Patient Choice offered to / list presented to : Patient  Discharge Placement   Existing PASRR number confirmed : 05/27/19          Patient [facility] bed at: [facility] Patient to be transferred to facility by: [place] Name of family member notified: Patient notifying family Patient and family notified of of transfer: 05/27/19  Discharge Plan and Services     Post Acute Care Choice: [place]          DME Arranged: N/A DME Agency: NA         HH Agency: NA        Social Determinants of Health (SDOH) Interventions     Readmission Risk Interventions No flowsheet data found.

## 2019-05-27 NOTE — Progress Notes (Addendum)
Nutrition Follow-up  DOCUMENTATION CODES:   Severe malnutrition in context of chronic illness, Underweight  INTERVENTION:   -Decrease Ensure Enlive po to BID, each supplement provides 350 kcal and 20 grams of protein -Continue MVI with minerals daily -Continue Magic cup TID with meals, each supplement provides 290 kcal and 9 grams of protein -D/c Hormel Shake TID with meals, each supplement provides 520 kcals and 22 grams protein  NUTRITION DIAGNOSIS:   Severe Malnutrition related to chronic illness(heart failure, [name], DM) as evidenced by severe fat depletion, severe muscle depletion.  Ongoing  GOAL:   Patient will meet greater than or equal to 90% of their needs  Progressing   MONITOR:   Diet advancement, Labs, Weight trends, Skin, I & O's, Supplement acceptance, PO intake  REASON FOR ASSESSMENT:   Consult Assessment of nutrition requirement/status  ASSESSMENT:   This is a 85 year old male with a past medical history significant for coronary artery disease, hyperlipidemia, chronic systolic heart failure (status post ICD placement), type 2 diabetes with associated retinopathy, [name] 3, urinary retention.  Patient's sister is primary historian.Patient arrived to the ED via EMS due to hypoglycemia.  Upon arrival to ED, patient was unresponsive.  Initial CBG 40.  Per EMS, is typically verbal and alert.  Patient received amp of D 50 in ED.  This did temporarily normalized the glucose however patient became hypoglycemic again and required initiation of D5 half normal saline to maintain appropriate glucose levels.  8/26- s/p MBSS- advanced to dysphagia 1 diet with nectar thick liquids  Reviewed I/O's: -750 ml x 24 hours and -1.9 L since admission  UOP: 750 ml x 24 hours  Pt resting quietly in recliner chair at time of visit. RD did not disturb. Noted unopened Ensure bottle on sink. Pt accepting Ensure supplements approximately 50% of the time.   Pt enjoying pureed foods and has  a good appetite. Noted meal completion 75-100%.   Per CSW notes, plan to d/c to SNF once bed is available. Pt medically ready for discharge.   Labs reviewed: CBGS: 97-193 (inpatient orders for glycemic control are 0-5 units insulin aspart q HS, 0-9 units insulin aspart TID with meals, and 5 units insulin glargine q HS).   Diet Order:   Diet Order            DIET - DYS 1 Room service appropriate? No; Fluid consistency: Nectar Thick  Diet effective now              EDUCATION NEEDS:   Not appropriate for education at this time  Skin:  Skin Assessment: Reviewed RN Assessment  Last BM:  05/25/19  Height:   Ht Readings from Last 1 Encounters:  05/21/19 5\' 8"  (1.727 m)    Weight:   Wt Readings from Last 1 Encounters:  05/27/19 54.5 kg    Ideal Body Weight:  70 kg  BMI:  Body mass index is 18.27 kg/m.  Estimated Nutritional Needs:   Kcal:  1600-1800  Protein:  70-85 grams  Fluid:  > 1.6 L    Rickiya Picariello A. Jimmye Norman, RD, LDN, [place] Registered Dietitian II Certified Diabetes Care and Education Specialist Pager: [phone] After hours Pager: [phone]

## 2019-05-27 NOTE — Progress Notes (Signed)
Occupational Therapy Treatment Patient Details Name: [name] MRN: [id number] DOB: [birth year] Today's Date: 05/27/2019    History of present illness This is a 85 year old male with a past medical history significant for coronary artery disease, hyperlipidemia, chronic systolic heart failure (status post ICD placement), type 2 diabetes with associated retinopathy, [name] 3, urinary retention. Patient arrived to the ED via EMS due to hypoglycemia.  CXR opacities in left middle lobe and right base that could be consistent with pneumonia.   OT comments  Pt supine in bed but transferred into recliner chair with RW and min guard for safety. Pt given the Trail Making Test Parts A and B. Pt was given verbal directions as well as OT demonstrating what pt was expected to perform on test. Pt returned demonstrations on sample test to show understanding. Pt needing 4 mins and 20 seconds to complete Part A which draws line from 1-25 in order. Pt required increased time for visual scanning and sequencing. Pt was able to attend to this task with cuing for correct number. This test takes 90 seconds on average to complete. Pt attempting Part B which involves drawing lines to to alternating numbers and letters that correspond ( I.e, 1-a-2-b-3 etc). Pt was unable to sequence, problem solve, and attend to this task after 2 minutes pt has only arrived at letter B and was unsure of what to do next. Tested stopped at this time as pt having increased frustration. Pt needs continued therapy to address cognition and functional deficits.   Follow Up Recommendations  SNF;Supervision/Assistance - 24 hour    Equipment Recommendations  Other (comment)(defer to next venue of care)       Precautions / Restrictions Precautions Precautions: Fall       Mobility Bed Mobility Overal bed mobility: Needs Assistance Bed Mobility: Supine to Sit     Supine to sit: Min guard        Transfers Overall transfer level: Needs  assistance Equipment used: Rolling walker (2 wheeled) Transfers: Sit to/from Stand Sit to Stand: Min guard         General transfer comment: needs cues for all transitions for hand placement    Balance Overall balance assessment: Needs assistance Sitting-balance support: Feet supported Sitting balance-Leahy Scale: Fair     Standing balance support: Bilateral upper extremity supported Standing balance-Leahy Scale: Poor Standing balance comment: reliance of RW            ADL either performed or assessed with clinical judgement        Vision Baseline Vision/History: No visual deficits            Cognition Arousal/Alertness: Awake/alert Behavior During Therapy: WFL for tasks assessed/performed Overall Cognitive Status: No family/caregiver present to determine baseline cognitive functioning       General Comments: mod multimodal cuing for cognition                   Pertinent Vitals/ Pain       Pain Assessment: No/denies pain         Frequency  Min 2X/week        Progress Toward Goals  OT Goals(current goals can now be found in the care plan section)  Progress towards OT goals: Progressing toward goals  Acute Rehab OT Goals Patient Stated Goal: to get better OT Goal Formulation: With patient Time For Goal Achievement: 06/10/19 Potential to Achieve Goals: Good  Plan Discharge plan remains appropriate       AM-PAC OT "  6 Clicks" Daily Activity     Outcome Measure   Help from another person eating meals?: None Help from another person taking care of personal grooming?: A Lot Help from another person toileting, which includes using toliet, bedpan, or urinal?: A Lot Help from another person bathing (including washing, rinsing, drying)?: A Lot Help from another person to put on and taking off regular upper body clothing?: A Lot Help from another person to put on and taking off regular lower body clothing?: A Lot 6 Click Score: 14    End of  Session Equipment Utilized During Treatment: Rolling walker  OT Visit Diagnosis: Unsteadiness on feet (R26.81);Muscle weakness (generalized) (M62.81)   Activity Tolerance Patient tolerated treatment well   Patient Left in chair;with call bell/phone within reach;with chair alarm set           Time: 1062-6948 OT Time Calculation (min): 25 min  Charges: OT General Charges $OT Visit: 1 Visit OT Treatments $Therapeutic Activity: 23-37 mins   Brindley Madarang P, MS, OTR/L 05/27/2019, 11:44 AM

## 2019-06-03 ENCOUNTER — Encounter: Payer: Medicare HMO | Admitting: Internal Medicine

## 2019-06-11 DIAGNOSIS — M6281 Muscle weakness (generalized): Secondary | ICD-10-CM | POA: Diagnosis not present

## 2019-06-11 DIAGNOSIS — E11649 Type 2 diabetes mellitus with hypoglycemia without coma: Secondary | ICD-10-CM | POA: Diagnosis not present

## 2019-06-11 DIAGNOSIS — N401 Enlarged prostate with lower urinary tract symptoms: Secondary | ICD-10-CM | POA: Diagnosis not present

## 2019-06-11 DIAGNOSIS — N179 Acute kidney failure, unspecified: Secondary | ICD-10-CM | POA: Diagnosis not present

## 2019-06-11 DIAGNOSIS — R339 Retention of urine, unspecified: Secondary | ICD-10-CM | POA: Diagnosis not present

## 2019-06-11 DIAGNOSIS — R1312 Dysphagia, oropharyngeal phase: Secondary | ICD-10-CM | POA: Diagnosis not present

## 2019-06-12 DIAGNOSIS — L97819 Non-pressure chronic ulcer of other part of right lower leg with unspecified severity: Secondary | ICD-10-CM | POA: Diagnosis not present

## 2019-06-12 DIAGNOSIS — R339 Retention of urine, unspecified: Secondary | ICD-10-CM | POA: Diagnosis not present

## 2019-06-12 DIAGNOSIS — I2510 Atherosclerotic heart disease of native coronary artery without angina pectoris: Secondary | ICD-10-CM | POA: Diagnosis not present

## 2019-06-12 DIAGNOSIS — E11649 Type 2 diabetes mellitus with hypoglycemia without coma: Secondary | ICD-10-CM | POA: Diagnosis not present

## 2019-06-12 DIAGNOSIS — I10 Essential (primary) hypertension: Secondary | ICD-10-CM | POA: Diagnosis not present

## 2019-06-12 DIAGNOSIS — M6281 Muscle weakness (generalized): Secondary | ICD-10-CM | POA: Diagnosis not present

## 2019-06-12 DIAGNOSIS — I509 Heart failure, unspecified: Secondary | ICD-10-CM | POA: Diagnosis not present

## 2019-06-12 DIAGNOSIS — E785 Hyperlipidemia, unspecified: Secondary | ICD-10-CM | POA: Diagnosis not present

## 2019-06-12 DIAGNOSIS — K219 Gastro-esophageal reflux disease without esophagitis: Secondary | ICD-10-CM | POA: Diagnosis not present

## 2019-06-16 DIAGNOSIS — C320 Malignant neoplasm of glottis: Secondary | ICD-10-CM | POA: Diagnosis not present

## 2019-06-16 DIAGNOSIS — R1312 Dysphagia, oropharyngeal phase: Secondary | ICD-10-CM | POA: Diagnosis not present

## 2019-06-16 DIAGNOSIS — I509 Heart failure, unspecified: Secondary | ICD-10-CM | POA: Diagnosis not present

## 2019-06-16 DIAGNOSIS — N401 Enlarged prostate with lower urinary tract symptoms: Secondary | ICD-10-CM | POA: Diagnosis not present

## 2019-06-16 DIAGNOSIS — E1122 Type 2 diabetes mellitus with diabetic chronic kidney disease: Secondary | ICD-10-CM | POA: Diagnosis not present

## 2019-06-16 DIAGNOSIS — N183 Chronic kidney disease, stage 3 unspecified: Secondary | ICD-10-CM | POA: Diagnosis not present

## 2019-06-16 DIAGNOSIS — S81812D Laceration without foreign body, left lower leg, subsequent encounter: Secondary | ICD-10-CM | POA: Diagnosis not present

## 2019-06-16 DIAGNOSIS — R338 Other retention of urine: Secondary | ICD-10-CM | POA: Diagnosis not present

## 2019-06-16 DIAGNOSIS — I130 Hypertensive heart and chronic kidney disease with heart failure and stage 1 through stage 4 chronic kidney disease, or unspecified chronic kidney disease: Secondary | ICD-10-CM | POA: Diagnosis not present

## 2019-06-16 DIAGNOSIS — K5900 Constipation, unspecified: Secondary | ICD-10-CM | POA: Diagnosis not present

## 2019-06-16 DIAGNOSIS — E11319 Type 2 diabetes mellitus with unspecified diabetic retinopathy without macular edema: Secondary | ICD-10-CM | POA: Diagnosis not present

## 2019-06-16 DIAGNOSIS — I5022 Chronic systolic (congestive) heart failure: Secondary | ICD-10-CM | POA: Diagnosis not present

## 2019-06-17 DIAGNOSIS — E1122 Type 2 diabetes mellitus with diabetic chronic kidney disease: Secondary | ICD-10-CM | POA: Diagnosis not present

## 2019-06-17 DIAGNOSIS — S81812D Laceration without foreign body, left lower leg, subsequent encounter: Secondary | ICD-10-CM | POA: Diagnosis not present

## 2019-06-17 DIAGNOSIS — R338 Other retention of urine: Secondary | ICD-10-CM | POA: Diagnosis not present

## 2019-06-17 DIAGNOSIS — N183 Chronic kidney disease, stage 3 unspecified: Secondary | ICD-10-CM | POA: Diagnosis not present

## 2019-06-17 DIAGNOSIS — I130 Hypertensive heart and chronic kidney disease with heart failure and stage 1 through stage 4 chronic kidney disease, or unspecified chronic kidney disease: Secondary | ICD-10-CM | POA: Diagnosis not present

## 2019-06-17 DIAGNOSIS — I509 Heart failure, unspecified: Secondary | ICD-10-CM | POA: Diagnosis not present

## 2019-06-17 DIAGNOSIS — R1312 Dysphagia, oropharyngeal phase: Secondary | ICD-10-CM | POA: Diagnosis not present

## 2019-06-17 DIAGNOSIS — E11319 Type 2 diabetes mellitus with unspecified diabetic retinopathy without macular edema: Secondary | ICD-10-CM | POA: Diagnosis not present

## 2019-06-17 DIAGNOSIS — N401 Enlarged prostate with lower urinary tract symptoms: Secondary | ICD-10-CM | POA: Diagnosis not present

## 2019-06-18 ENCOUNTER — Other Ambulatory Visit: Payer: Self-pay

## 2019-06-18 ENCOUNTER — Ambulatory Visit (INDEPENDENT_AMBULATORY_CARE_PROVIDER_SITE_OTHER): Payer: Medicare HMO | Admitting: Internal Medicine

## 2019-06-18 DIAGNOSIS — E1151 Type 2 diabetes mellitus with diabetic peripheral angiopathy without gangrene: Secondary | ICD-10-CM

## 2019-06-18 DIAGNOSIS — R339 Retention of urine, unspecified: Secondary | ICD-10-CM | POA: Diagnosis not present

## 2019-06-18 DIAGNOSIS — I5020 Unspecified systolic (congestive) heart failure: Secondary | ICD-10-CM

## 2019-06-18 DIAGNOSIS — N189 Chronic kidney disease, unspecified: Secondary | ICD-10-CM

## 2019-06-18 DIAGNOSIS — E1122 Type 2 diabetes mellitus with diabetic chronic kidney disease: Secondary | ICD-10-CM | POA: Diagnosis not present

## 2019-06-18 DIAGNOSIS — R531 Weakness: Secondary | ICD-10-CM

## 2019-06-18 NOTE — Progress Notes (Signed)
   [facility] Telephone Encounter  Reason for call:   This telephone encounter was created for [name] on 06/18/2019 for the following purpose/cc HFU T2DM, Urinary retention.   Pertinent Data:  Past Medical History:  Diagnosis Date  . Adenomatous colon polyp 02/14/2012  . BBB (bundle branch block)    right  . Carotid stenosis    a. s/p L carotid stent 2004;  b. Carotid US (09/2014): Bilateral ICA 1-39%, left ECA >59%, normal subclavian bilaterally, occluded left vertebral >> FU 2 years  . Chronic kidney disease   . Chronic systolic CHF (congestive heart failure) (HCC)    a. ischemic CM EF 15-20%;  b. s/p AICD 05/24/04;  c. Echo 7/06: EF 30-40%, mild reduced RVSF, d. Echo 12/2015 EF 35-40%  . Elevated PSA   . HTN (hypertension)   . Hyperlipidemia   . ICD (implantable cardiac defibrillator) in place 12-25-2012   MDT CRTD upgrade by Dr Lovena Le  . Myocardial infarction ([place]) 1990  . Pericardial effusion    Echocardiogram (09/2014): EF 25% with distal anterior, distal inferior, distal lateral and apical akinesis, grade 1 diastolic dysfunction, very mild aortic stenosis (mean 7 mmHg) - this may be depressed due to low EF (2-D images suggest mild to moderate aortic stenosis), large pericardial effusion, no RA collapse  . PVD (peripheral vascular disease) ([place])    s/p L carotid PTCA/stent 2004  . Transient ischemic attack   . Type II diabetes mellitus (HCC)   ROS: Pulmonary: pt denies increased work of breathing, shortness of breath,  Cardiac: pt denies palpitations, chest pain,   Abdominal: pt denies abdominal pain, nausea, vomiting, or diarrhea   Assessment / Plan / Recommendations:   Hypoglycemia: no hypoglycemic episodes.  Mainly staying between 100-150.  Has a nurse checking on it.      Urinary retention: Foley removed Friday at urology office and urinating well  As always, pt is advised that if symptoms worsen or new symptoms arise, they  should go to an urgent care facility or to to ER for further evaluation.   Consent and Medical Decision Making:   Patient discussed with Dr. Lynnae January  This is a telephone encounter between [name] and Vickki Muff on 06/18/2019 for hypoglycemia, urinary retention, generalized weakness. The visit was conducted with the patient located at home and Vickki Muff at [facility]. The patient's identity was confirmed using their DOB and current address. The patient has consented to being evaluated through a telephone encounter and understands the associated risks (an examination cannot be done and the patient may need to come in for an appointment) / benefits (allows the patient to remain at home, decreasing exposure to coronavirus). I personally spent 16 minutes on medical discussion.

## 2019-06-19 ENCOUNTER — Encounter: Payer: Self-pay | Admitting: Internal Medicine

## 2019-06-19 NOTE — Assessment & Plan Note (Signed)
Foley removed Friday at urology office and urinating well without issues.    -continue urology follow up

## 2019-06-19 NOTE — Assessment & Plan Note (Signed)
Patient having trouble executing his ADL's such as cooking, bathing.  He has had a gradual decline in his function status and just returned from a stay in a skilled nursing facility due to generalized weakness.  He has multiple contributing comorbidities such as T2DM, [name], heart failure with reduced ejection fraction.    -Will order a home health aide to assist the patient

## 2019-06-20 ENCOUNTER — Telehealth: Payer: Self-pay

## 2019-06-20 DIAGNOSIS — N401 Enlarged prostate with lower urinary tract symptoms: Secondary | ICD-10-CM | POA: Diagnosis not present

## 2019-06-20 DIAGNOSIS — E1122 Type 2 diabetes mellitus with diabetic chronic kidney disease: Secondary | ICD-10-CM | POA: Diagnosis not present

## 2019-06-20 DIAGNOSIS — I130 Hypertensive heart and chronic kidney disease with heart failure and stage 1 through stage 4 chronic kidney disease, or unspecified chronic kidney disease: Secondary | ICD-10-CM | POA: Diagnosis not present

## 2019-06-20 DIAGNOSIS — S81812D Laceration without foreign body, left lower leg, subsequent encounter: Secondary | ICD-10-CM | POA: Diagnosis not present

## 2019-06-20 DIAGNOSIS — N183 Chronic kidney disease, stage 3 unspecified: Secondary | ICD-10-CM | POA: Diagnosis not present

## 2019-06-20 DIAGNOSIS — R1312 Dysphagia, oropharyngeal phase: Secondary | ICD-10-CM | POA: Diagnosis not present

## 2019-06-20 DIAGNOSIS — E11319 Type 2 diabetes mellitus with unspecified diabetic retinopathy without macular edema: Secondary | ICD-10-CM | POA: Diagnosis not present

## 2019-06-20 DIAGNOSIS — I509 Heart failure, unspecified: Secondary | ICD-10-CM | POA: Diagnosis not present

## 2019-06-20 DIAGNOSIS — R338 Other retention of urine: Secondary | ICD-10-CM | POA: Diagnosis not present

## 2019-06-20 NOTE — Telephone Encounter (Signed)
I have put in a community Message for [facility] who the patient has used before to see if they are able to take him for [facility] care [place], [place] C9/25/202010:19 AM

## 2019-06-23 ENCOUNTER — Telehealth: Payer: Self-pay | Admitting: *Deleted

## 2019-06-23 DIAGNOSIS — R1312 Dysphagia, oropharyngeal phase: Secondary | ICD-10-CM | POA: Diagnosis not present

## 2019-06-23 DIAGNOSIS — R338 Other retention of urine: Secondary | ICD-10-CM | POA: Diagnosis not present

## 2019-06-23 DIAGNOSIS — S81812D Laceration without foreign body, left lower leg, subsequent encounter: Secondary | ICD-10-CM | POA: Diagnosis not present

## 2019-06-23 DIAGNOSIS — I130 Hypertensive heart and chronic kidney disease with heart failure and stage 1 through stage 4 chronic kidney disease, or unspecified chronic kidney disease: Secondary | ICD-10-CM | POA: Diagnosis not present

## 2019-06-23 DIAGNOSIS — I509 Heart failure, unspecified: Secondary | ICD-10-CM | POA: Diagnosis not present

## 2019-06-23 DIAGNOSIS — E1122 Type 2 diabetes mellitus with diabetic chronic kidney disease: Secondary | ICD-10-CM | POA: Diagnosis not present

## 2019-06-23 DIAGNOSIS — N183 Chronic kidney disease, stage 3 unspecified: Secondary | ICD-10-CM | POA: Diagnosis not present

## 2019-06-23 DIAGNOSIS — N401 Enlarged prostate with lower urinary tract symptoms: Secondary | ICD-10-CM | POA: Diagnosis not present

## 2019-06-23 DIAGNOSIS — E11319 Type 2 diabetes mellitus with unspecified diabetic retinopathy without macular edema: Secondary | ICD-10-CM | POA: Diagnosis not present

## 2019-06-23 NOTE — Progress Notes (Signed)
Internal Medicine Clinic Attending  Case discussed with Dr. Winfrey  at the time of the visit.  We reviewed the resident's history and exam and pertinent patient test results.  I agree with the assessment, diagnosis, and plan of care documented in the resident's note.  

## 2019-06-23 NOTE — Telephone Encounter (Signed)
[facility] ask for 2x week for 4 weeks for dyshagia VO given do you agree?

## 2019-06-24 DIAGNOSIS — N183 Chronic kidney disease, stage 3 unspecified: Secondary | ICD-10-CM | POA: Diagnosis not present

## 2019-06-24 DIAGNOSIS — N401 Enlarged prostate with lower urinary tract symptoms: Secondary | ICD-10-CM | POA: Diagnosis not present

## 2019-06-24 DIAGNOSIS — E1122 Type 2 diabetes mellitus with diabetic chronic kidney disease: Secondary | ICD-10-CM | POA: Diagnosis not present

## 2019-06-24 DIAGNOSIS — I130 Hypertensive heart and chronic kidney disease with heart failure and stage 1 through stage 4 chronic kidney disease, or unspecified chronic kidney disease: Secondary | ICD-10-CM | POA: Diagnosis not present

## 2019-06-24 DIAGNOSIS — I509 Heart failure, unspecified: Secondary | ICD-10-CM | POA: Diagnosis not present

## 2019-06-24 DIAGNOSIS — R1312 Dysphagia, oropharyngeal phase: Secondary | ICD-10-CM | POA: Diagnosis not present

## 2019-06-24 DIAGNOSIS — E11319 Type 2 diabetes mellitus with unspecified diabetic retinopathy without macular edema: Secondary | ICD-10-CM | POA: Diagnosis not present

## 2019-06-24 DIAGNOSIS — S81812D Laceration without foreign body, left lower leg, subsequent encounter: Secondary | ICD-10-CM | POA: Diagnosis not present

## 2019-06-24 DIAGNOSIS — R338 Other retention of urine: Secondary | ICD-10-CM | POA: Diagnosis not present

## 2019-06-24 NOTE — Telephone Encounter (Signed)
    after reviewing this patient it seems as if the visit was only by telephone. medicare now requires note to be video as well. can this patient have a video visit with MD?   Previous Messages  ----- Message -----  From: Judge Stall, NT  Sent: 06/20/2019 10:15 AM EDT  To: Danny Lawless  Subject: home health order                 Good morning this patient has used your company before would you all be able to take him [place], [place] C9/25/202010:17 AM

## 2019-06-24 NOTE — Telephone Encounter (Signed)
Yes, thank you.

## 2019-06-24 NOTE — Telephone Encounter (Signed)
I am happy to see the patient however is helpful.

## 2019-06-26 DIAGNOSIS — S81812D Laceration without foreign body, left lower leg, subsequent encounter: Secondary | ICD-10-CM | POA: Diagnosis not present

## 2019-06-26 DIAGNOSIS — N183 Chronic kidney disease, stage 3 unspecified: Secondary | ICD-10-CM | POA: Diagnosis not present

## 2019-06-26 DIAGNOSIS — I509 Heart failure, unspecified: Secondary | ICD-10-CM | POA: Diagnosis not present

## 2019-06-26 DIAGNOSIS — R338 Other retention of urine: Secondary | ICD-10-CM | POA: Diagnosis not present

## 2019-06-26 DIAGNOSIS — E11319 Type 2 diabetes mellitus with unspecified diabetic retinopathy without macular edema: Secondary | ICD-10-CM | POA: Diagnosis not present

## 2019-06-26 DIAGNOSIS — R1312 Dysphagia, oropharyngeal phase: Secondary | ICD-10-CM | POA: Diagnosis not present

## 2019-06-26 DIAGNOSIS — N401 Enlarged prostate with lower urinary tract symptoms: Secondary | ICD-10-CM | POA: Diagnosis not present

## 2019-06-26 DIAGNOSIS — I130 Hypertensive heart and chronic kidney disease with heart failure and stage 1 through stage 4 chronic kidney disease, or unspecified chronic kidney disease: Secondary | ICD-10-CM | POA: Diagnosis not present

## 2019-06-26 DIAGNOSIS — E1122 Type 2 diabetes mellitus with diabetic chronic kidney disease: Secondary | ICD-10-CM | POA: Diagnosis not present

## 2019-06-26 NOTE — Telephone Encounter (Signed)
I called to the patients home today because we were trying to get home health for him.I spoke to the son who was with him.The Patient has [name]. He is receiving PT and [place] service and he has a Education officer, museum all in place and a home health aid [place], [place] C10/1/20204:27 PM

## 2019-06-27 DIAGNOSIS — N401 Enlarged prostate with lower urinary tract symptoms: Secondary | ICD-10-CM | POA: Diagnosis not present

## 2019-06-27 DIAGNOSIS — R338 Other retention of urine: Secondary | ICD-10-CM | POA: Diagnosis not present

## 2019-06-27 DIAGNOSIS — N183 Chronic kidney disease, stage 3 unspecified: Secondary | ICD-10-CM | POA: Diagnosis not present

## 2019-06-27 DIAGNOSIS — I130 Hypertensive heart and chronic kidney disease with heart failure and stage 1 through stage 4 chronic kidney disease, or unspecified chronic kidney disease: Secondary | ICD-10-CM | POA: Diagnosis not present

## 2019-06-27 DIAGNOSIS — E1122 Type 2 diabetes mellitus with diabetic chronic kidney disease: Secondary | ICD-10-CM | POA: Diagnosis not present

## 2019-06-27 DIAGNOSIS — R1312 Dysphagia, oropharyngeal phase: Secondary | ICD-10-CM | POA: Diagnosis not present

## 2019-06-27 DIAGNOSIS — I509 Heart failure, unspecified: Secondary | ICD-10-CM | POA: Diagnosis not present

## 2019-06-27 DIAGNOSIS — E11319 Type 2 diabetes mellitus with unspecified diabetic retinopathy without macular edema: Secondary | ICD-10-CM | POA: Diagnosis not present

## 2019-06-27 DIAGNOSIS — S81812D Laceration without foreign body, left lower leg, subsequent encounter: Secondary | ICD-10-CM | POA: Diagnosis not present

## 2019-06-30 DIAGNOSIS — N183 Chronic kidney disease, stage 3 unspecified: Secondary | ICD-10-CM | POA: Diagnosis not present

## 2019-06-30 DIAGNOSIS — E11319 Type 2 diabetes mellitus with unspecified diabetic retinopathy without macular edema: Secondary | ICD-10-CM | POA: Diagnosis not present

## 2019-06-30 DIAGNOSIS — N401 Enlarged prostate with lower urinary tract symptoms: Secondary | ICD-10-CM | POA: Diagnosis not present

## 2019-06-30 DIAGNOSIS — R1312 Dysphagia, oropharyngeal phase: Secondary | ICD-10-CM | POA: Diagnosis not present

## 2019-06-30 DIAGNOSIS — E1122 Type 2 diabetes mellitus with diabetic chronic kidney disease: Secondary | ICD-10-CM | POA: Diagnosis not present

## 2019-06-30 DIAGNOSIS — R338 Other retention of urine: Secondary | ICD-10-CM | POA: Diagnosis not present

## 2019-06-30 DIAGNOSIS — I509 Heart failure, unspecified: Secondary | ICD-10-CM | POA: Diagnosis not present

## 2019-06-30 DIAGNOSIS — S81812D Laceration without foreign body, left lower leg, subsequent encounter: Secondary | ICD-10-CM | POA: Diagnosis not present

## 2019-06-30 DIAGNOSIS — I130 Hypertensive heart and chronic kidney disease with heart failure and stage 1 through stage 4 chronic kidney disease, or unspecified chronic kidney disease: Secondary | ICD-10-CM | POA: Diagnosis not present

## 2019-07-01 ENCOUNTER — Encounter: Payer: Self-pay | Admitting: Podiatry

## 2019-07-01 ENCOUNTER — Ambulatory Visit (INDEPENDENT_AMBULATORY_CARE_PROVIDER_SITE_OTHER): Payer: Medicare HMO | Admitting: Podiatry

## 2019-07-01 ENCOUNTER — Other Ambulatory Visit: Payer: Self-pay

## 2019-07-01 VITALS — BP 146/91 | HR 91

## 2019-07-01 DIAGNOSIS — S81812D Laceration without foreign body, left lower leg, subsequent encounter: Secondary | ICD-10-CM | POA: Diagnosis not present

## 2019-07-01 DIAGNOSIS — N401 Enlarged prostate with lower urinary tract symptoms: Secondary | ICD-10-CM | POA: Diagnosis not present

## 2019-07-01 DIAGNOSIS — E1151 Type 2 diabetes mellitus with diabetic peripheral angiopathy without gangrene: Secondary | ICD-10-CM

## 2019-07-01 DIAGNOSIS — R338 Other retention of urine: Secondary | ICD-10-CM | POA: Diagnosis not present

## 2019-07-01 DIAGNOSIS — I509 Heart failure, unspecified: Secondary | ICD-10-CM | POA: Diagnosis not present

## 2019-07-01 DIAGNOSIS — M79675 Pain in left toe(s): Secondary | ICD-10-CM

## 2019-07-01 DIAGNOSIS — E11319 Type 2 diabetes mellitus with unspecified diabetic retinopathy without macular edema: Secondary | ICD-10-CM | POA: Diagnosis not present

## 2019-07-01 DIAGNOSIS — E1122 Type 2 diabetes mellitus with diabetic chronic kidney disease: Secondary | ICD-10-CM | POA: Diagnosis not present

## 2019-07-01 DIAGNOSIS — R1312 Dysphagia, oropharyngeal phase: Secondary | ICD-10-CM | POA: Diagnosis not present

## 2019-07-01 DIAGNOSIS — B351 Tinea unguium: Secondary | ICD-10-CM | POA: Diagnosis not present

## 2019-07-01 DIAGNOSIS — M79674 Pain in right toe(s): Secondary | ICD-10-CM

## 2019-07-01 DIAGNOSIS — N183 Chronic kidney disease, stage 3 unspecified: Secondary | ICD-10-CM | POA: Diagnosis not present

## 2019-07-01 DIAGNOSIS — I130 Hypertensive heart and chronic kidney disease with heart failure and stage 1 through stage 4 chronic kidney disease, or unspecified chronic kidney disease: Secondary | ICD-10-CM | POA: Diagnosis not present

## 2019-07-02 DIAGNOSIS — E11319 Type 2 diabetes mellitus with unspecified diabetic retinopathy without macular edema: Secondary | ICD-10-CM | POA: Diagnosis not present

## 2019-07-02 DIAGNOSIS — I509 Heart failure, unspecified: Secondary | ICD-10-CM | POA: Diagnosis not present

## 2019-07-02 DIAGNOSIS — E1122 Type 2 diabetes mellitus with diabetic chronic kidney disease: Secondary | ICD-10-CM | POA: Diagnosis not present

## 2019-07-02 DIAGNOSIS — I130 Hypertensive heart and chronic kidney disease with heart failure and stage 1 through stage 4 chronic kidney disease, or unspecified chronic kidney disease: Secondary | ICD-10-CM | POA: Diagnosis not present

## 2019-07-02 DIAGNOSIS — R338 Other retention of urine: Secondary | ICD-10-CM | POA: Diagnosis not present

## 2019-07-02 DIAGNOSIS — N401 Enlarged prostate with lower urinary tract symptoms: Secondary | ICD-10-CM | POA: Diagnosis not present

## 2019-07-02 DIAGNOSIS — S81812D Laceration without foreign body, left lower leg, subsequent encounter: Secondary | ICD-10-CM | POA: Diagnosis not present

## 2019-07-02 DIAGNOSIS — R1312 Dysphagia, oropharyngeal phase: Secondary | ICD-10-CM | POA: Diagnosis not present

## 2019-07-02 DIAGNOSIS — N183 Chronic kidney disease, stage 3 unspecified: Secondary | ICD-10-CM | POA: Diagnosis not present

## 2019-07-03 DIAGNOSIS — N183 Chronic kidney disease, stage 3 unspecified: Secondary | ICD-10-CM | POA: Diagnosis not present

## 2019-07-03 DIAGNOSIS — E1122 Type 2 diabetes mellitus with diabetic chronic kidney disease: Secondary | ICD-10-CM | POA: Diagnosis not present

## 2019-07-03 DIAGNOSIS — N401 Enlarged prostate with lower urinary tract symptoms: Secondary | ICD-10-CM | POA: Diagnosis not present

## 2019-07-03 DIAGNOSIS — R1312 Dysphagia, oropharyngeal phase: Secondary | ICD-10-CM | POA: Diagnosis not present

## 2019-07-03 DIAGNOSIS — S81812D Laceration without foreign body, left lower leg, subsequent encounter: Secondary | ICD-10-CM | POA: Diagnosis not present

## 2019-07-03 DIAGNOSIS — R338 Other retention of urine: Secondary | ICD-10-CM | POA: Diagnosis not present

## 2019-07-03 DIAGNOSIS — I130 Hypertensive heart and chronic kidney disease with heart failure and stage 1 through stage 4 chronic kidney disease, or unspecified chronic kidney disease: Secondary | ICD-10-CM | POA: Diagnosis not present

## 2019-07-03 DIAGNOSIS — I509 Heart failure, unspecified: Secondary | ICD-10-CM | POA: Diagnosis not present

## 2019-07-03 DIAGNOSIS — E11319 Type 2 diabetes mellitus with unspecified diabetic retinopathy without macular edema: Secondary | ICD-10-CM | POA: Diagnosis not present

## 2019-07-07 ENCOUNTER — Telehealth: Payer: Self-pay | Admitting: *Deleted

## 2019-07-07 ENCOUNTER — Other Ambulatory Visit: Payer: Self-pay

## 2019-07-07 ENCOUNTER — Ambulatory Visit (INDEPENDENT_AMBULATORY_CARE_PROVIDER_SITE_OTHER): Payer: Medicare HMO

## 2019-07-07 ENCOUNTER — Telehealth: Payer: Self-pay

## 2019-07-07 DIAGNOSIS — E1122 Type 2 diabetes mellitus with diabetic chronic kidney disease: Secondary | ICD-10-CM | POA: Diagnosis not present

## 2019-07-07 DIAGNOSIS — N183 Chronic kidney disease, stage 3 unspecified: Secondary | ICD-10-CM | POA: Diagnosis not present

## 2019-07-07 DIAGNOSIS — Z95810 Presence of automatic (implantable) cardiac defibrillator: Secondary | ICD-10-CM | POA: Diagnosis not present

## 2019-07-07 DIAGNOSIS — I130 Hypertensive heart and chronic kidney disease with heart failure and stage 1 through stage 4 chronic kidney disease, or unspecified chronic kidney disease: Secondary | ICD-10-CM | POA: Diagnosis not present

## 2019-07-07 DIAGNOSIS — R338 Other retention of urine: Secondary | ICD-10-CM | POA: Diagnosis not present

## 2019-07-07 DIAGNOSIS — N401 Enlarged prostate with lower urinary tract symptoms: Secondary | ICD-10-CM | POA: Diagnosis not present

## 2019-07-07 DIAGNOSIS — I5022 Chronic systolic (congestive) heart failure: Secondary | ICD-10-CM

## 2019-07-07 DIAGNOSIS — I509 Heart failure, unspecified: Secondary | ICD-10-CM | POA: Diagnosis not present

## 2019-07-07 DIAGNOSIS — S81812D Laceration without foreign body, left lower leg, subsequent encounter: Secondary | ICD-10-CM | POA: Diagnosis not present

## 2019-07-07 DIAGNOSIS — R1312 Dysphagia, oropharyngeal phase: Secondary | ICD-10-CM | POA: Diagnosis not present

## 2019-07-07 DIAGNOSIS — E11319 Type 2 diabetes mellitus with unspecified diabetic retinopathy without macular edema: Secondary | ICD-10-CM | POA: Diagnosis not present

## 2019-07-07 NOTE — Telephone Encounter (Signed)
Ok. Yes, I can place a referral if needed. Thanks!

## 2019-07-07 NOTE — Telephone Encounter (Signed)
   [facility] Pre-operative Risk Assessment    Request for surgical clearance:  1. What type of surgery is being performed? Lesion excision left lower eyelid   2. When is this surgery scheduled? 07/21/19   3. What type of clearance is required (medical clearance vs. Pharmacy clearance to hold med vs. Both)? Pharmacy  4. Are there any medications that need to be held prior to surgery and how long? Aspirin   5. Practice name and name of physician performing surgery? [facility]   6. What is your office phone number [phone]    7.   What is your office fax number [phone]  8.   Anesthesia type (None, local, MAC, general) ? MAC   [name] 07/07/2019, 1:59 PM  _________________________________________________________________   (provider comments below)

## 2019-07-07 NOTE — Telephone Encounter (Signed)
Dr. Lovena Le  [name]  He has an upcoming procedure, lesion excision of left lower eyelid scheduled for 07/21/2019 and the surgical team is asking for ASA holding recommendations.  You last saw the patient in follow-up on 02/11/2019.  He has a history of chronic systolic HF s/p ICD implantation, carotid stenosis s/p left carotid stenting 2004, [name], HTN, [name]   Please send your recommendations to the pre-op pool.   Thank you [name]

## 2019-07-07 NOTE — Telephone Encounter (Signed)
Speech therapist calls from pt's home and states pt's voice over the time she has been seeing him is becoming more hoarse and he c/o about his throat. She is advised to call dr bates office and report this as pt sees dr bates in the past for problems. She is agreeable and advised if needed to call [facility] back. She had originally called for a referral to an ENT.

## 2019-07-08 ENCOUNTER — Telehealth: Payer: Self-pay

## 2019-07-08 DIAGNOSIS — I509 Heart failure, unspecified: Secondary | ICD-10-CM | POA: Diagnosis not present

## 2019-07-08 DIAGNOSIS — E11319 Type 2 diabetes mellitus with unspecified diabetic retinopathy without macular edema: Secondary | ICD-10-CM | POA: Diagnosis not present

## 2019-07-08 DIAGNOSIS — N401 Enlarged prostate with lower urinary tract symptoms: Secondary | ICD-10-CM | POA: Diagnosis not present

## 2019-07-08 DIAGNOSIS — R1312 Dysphagia, oropharyngeal phase: Secondary | ICD-10-CM | POA: Diagnosis not present

## 2019-07-08 DIAGNOSIS — S81812D Laceration without foreign body, left lower leg, subsequent encounter: Secondary | ICD-10-CM | POA: Diagnosis not present

## 2019-07-08 DIAGNOSIS — N183 Chronic kidney disease, stage 3 unspecified: Secondary | ICD-10-CM | POA: Diagnosis not present

## 2019-07-08 DIAGNOSIS — I130 Hypertensive heart and chronic kidney disease with heart failure and stage 1 through stage 4 chronic kidney disease, or unspecified chronic kidney disease: Secondary | ICD-10-CM | POA: Diagnosis not present

## 2019-07-08 DIAGNOSIS — E1122 Type 2 diabetes mellitus with diabetic chronic kidney disease: Secondary | ICD-10-CM | POA: Diagnosis not present

## 2019-07-08 DIAGNOSIS — R338 Other retention of urine: Secondary | ICD-10-CM | POA: Diagnosis not present

## 2019-07-08 NOTE — Telephone Encounter (Signed)
Per Dr. Jarold Motto may hold aspirin as required by surgeon for eye surgery.

## 2019-07-08 NOTE — Progress Notes (Signed)
EPIC Encounter for ICM Monitoring  Patient Name: [name] is a 85 y.o. male Date: 07/08/2019 Primary Care Physican: Axel Filler, MD Primary Cardiologist:Taylor Electrophysiologist: Santina Evans Pacing: 98.4% 05/27/2019 Weight:120lbs(office visit)  Attempted patient call and unable to reach.  Transmission reviewed.  OptivolThoracic impedance suggestive of possible ongoing fluid accumulation since 06/16/2019.   Prescribed:Furosemide 40 mg 1 tablet twice a day.   Labs: 05/26/2019 Creatinine 1.66, BUN 38, Potassium 4.2, Sodium 143, GFR 37-43 05/25/2019 Creatinine 1.65, BUN 43, Potassium 4.0, Sodium 141, GFR 37-43  05/24/2019 Creatinine 1.81, BUN 44, Potassium 3.9, Sodium 138, GFR 33-38  05/23/2019 Creatinine 2.17, BUN 51, Potassium 4.0, Sodium 136, GFR 27-31  05/22/2019 Creatinine 2.33, BUN 48, Potassium 4.7, Sodium 131, GFR 24-28  05/21/2019 Creatinine 2.26, BUN 41, Potassium 3.9, Sodium 136, GFR 25-29  A complete set of results can be found in Results Review.  Recommendations: Unable to reach.    Follow-up plan: ICM clinic phone appointment on 07/15/2019 to recheck fluid levels.   91 day device clinic remote transmission 08/06/2019.    Copy of ICM check sent to Dr. Lovena Le.   3 month ICM trend: 07/07/2019    1 Year ICM trend:       [name], RN 07/08/2019 1:27 PM

## 2019-07-08 NOTE — Telephone Encounter (Signed)
Remote ICM transmission received.  Attempted call to patient regarding ICM remote transmission and no answer.  

## 2019-07-09 DIAGNOSIS — R338 Other retention of urine: Secondary | ICD-10-CM | POA: Diagnosis not present

## 2019-07-09 DIAGNOSIS — E1122 Type 2 diabetes mellitus with diabetic chronic kidney disease: Secondary | ICD-10-CM | POA: Diagnosis not present

## 2019-07-09 DIAGNOSIS — I130 Hypertensive heart and chronic kidney disease with heart failure and stage 1 through stage 4 chronic kidney disease, or unspecified chronic kidney disease: Secondary | ICD-10-CM | POA: Diagnosis not present

## 2019-07-09 DIAGNOSIS — I509 Heart failure, unspecified: Secondary | ICD-10-CM | POA: Diagnosis not present

## 2019-07-09 DIAGNOSIS — N183 Chronic kidney disease, stage 3 unspecified: Secondary | ICD-10-CM | POA: Diagnosis not present

## 2019-07-09 DIAGNOSIS — E11319 Type 2 diabetes mellitus with unspecified diabetic retinopathy without macular edema: Secondary | ICD-10-CM | POA: Diagnosis not present

## 2019-07-09 DIAGNOSIS — S81812D Laceration without foreign body, left lower leg, subsequent encounter: Secondary | ICD-10-CM | POA: Diagnosis not present

## 2019-07-09 DIAGNOSIS — R1312 Dysphagia, oropharyngeal phase: Secondary | ICD-10-CM | POA: Diagnosis not present

## 2019-07-09 DIAGNOSIS — N401 Enlarged prostate with lower urinary tract symptoms: Secondary | ICD-10-CM | POA: Diagnosis not present

## 2019-07-10 DIAGNOSIS — Z87891 Personal history of nicotine dependence: Secondary | ICD-10-CM | POA: Diagnosis not present

## 2019-07-10 DIAGNOSIS — R1312 Dysphagia, oropharyngeal phase: Secondary | ICD-10-CM | POA: Diagnosis not present

## 2019-07-10 DIAGNOSIS — C320 Malignant neoplasm of glottis: Secondary | ICD-10-CM | POA: Insufficient documentation

## 2019-07-10 DIAGNOSIS — I130 Hypertensive heart and chronic kidney disease with heart failure and stage 1 through stage 4 chronic kidney disease, or unspecified chronic kidney disease: Secondary | ICD-10-CM | POA: Diagnosis not present

## 2019-07-10 DIAGNOSIS — R1313 Dysphagia, pharyngeal phase: Secondary | ICD-10-CM | POA: Insufficient documentation

## 2019-07-10 DIAGNOSIS — S81812D Laceration without foreign body, left lower leg, subsequent encounter: Secondary | ICD-10-CM | POA: Diagnosis not present

## 2019-07-10 DIAGNOSIS — E11319 Type 2 diabetes mellitus with unspecified diabetic retinopathy without macular edema: Secondary | ICD-10-CM | POA: Diagnosis not present

## 2019-07-10 DIAGNOSIS — E1122 Type 2 diabetes mellitus with diabetic chronic kidney disease: Secondary | ICD-10-CM | POA: Diagnosis not present

## 2019-07-10 DIAGNOSIS — R490 Dysphonia: Secondary | ICD-10-CM | POA: Insufficient documentation

## 2019-07-10 DIAGNOSIS — Z972 Presence of dental prosthetic device (complete) (partial): Secondary | ICD-10-CM | POA: Diagnosis not present

## 2019-07-10 DIAGNOSIS — R338 Other retention of urine: Secondary | ICD-10-CM | POA: Diagnosis not present

## 2019-07-10 DIAGNOSIS — J343 Hypertrophy of nasal turbinates: Secondary | ICD-10-CM | POA: Diagnosis not present

## 2019-07-10 DIAGNOSIS — I509 Heart failure, unspecified: Secondary | ICD-10-CM | POA: Diagnosis not present

## 2019-07-10 DIAGNOSIS — N401 Enlarged prostate with lower urinary tract symptoms: Secondary | ICD-10-CM | POA: Diagnosis not present

## 2019-07-10 DIAGNOSIS — J387 Other diseases of larynx: Secondary | ICD-10-CM | POA: Diagnosis not present

## 2019-07-10 DIAGNOSIS — N183 Chronic kidney disease, stage 3 unspecified: Secondary | ICD-10-CM | POA: Diagnosis not present

## 2019-07-14 ENCOUNTER — Other Ambulatory Visit: Payer: Self-pay | Admitting: Internal Medicine

## 2019-07-15 ENCOUNTER — Ambulatory Visit (INDEPENDENT_AMBULATORY_CARE_PROVIDER_SITE_OTHER): Payer: Medicare HMO

## 2019-07-15 DIAGNOSIS — Z95810 Presence of automatic (implantable) cardiac defibrillator: Secondary | ICD-10-CM

## 2019-07-15 DIAGNOSIS — I5022 Chronic systolic (congestive) heart failure: Secondary | ICD-10-CM

## 2019-07-16 ENCOUNTER — Other Ambulatory Visit: Payer: Self-pay | Admitting: Otolaryngology

## 2019-07-16 ENCOUNTER — Other Ambulatory Visit: Payer: Self-pay

## 2019-07-16 DIAGNOSIS — C329 Malignant neoplasm of larynx, unspecified: Secondary | ICD-10-CM

## 2019-07-16 NOTE — Progress Notes (Signed)
Subjective:   Patient ID: [name], male   DOB: 85 y.o.   MRN: [id number]   HPI 85 year old male presents the office today for concerns of thick, painful, elongated toenails that he cannot trim himself.  He does have a history of diabetes with vascular disease.  He has been in rehab for wounds on his legs for the last 1 month.  He is currently being treated for this by another physician.   Review of Systems  All other systems reviewed and are negative.  Past Medical History:  Diagnosis Date  . Adenomatous colon polyp 02/14/2012  . BBB (bundle branch block)    right  . Carotid stenosis    a. s/p L carotid stent 2004;  b. Carotid US (09/2014): Bilateral ICA 1-39%, left ECA >59%, normal subclavian bilaterally, occluded left vertebral >> FU 2 years  . Chronic kidney disease   . Chronic systolic CHF (congestive heart failure) (HCC)    a. ischemic CM EF 15-20%;  b. s/p AICD 05/24/04;  c. Echo 7/06: EF 30-40%, mild reduced RVSF, d. Echo 12/2015 EF 35-40%  . Elevated PSA   . HTN (hypertension)   . Hyperlipidemia   . ICD (implantable cardiac defibrillator) in place 12-25-2012   MDT CRTD upgrade by Dr Lovena Le  . Myocardial infarction ([place]) 1990  . Pericardial effusion    Echocardiogram (09/2014): EF 25% with distal anterior, distal inferior, distal lateral and apical akinesis, grade 1 diastolic dysfunction, very mild aortic stenosis (mean 7 mmHg) - this may be depressed due to low EF (2-D images suggest mild to moderate aortic stenosis), large pericardial effusion, no RA collapse  . PVD (peripheral vascular disease) ([place])    s/p L carotid PTCA/stent 2004  . Transient ischemic attack   . Type II diabetes mellitus ([place])     Past Surgical History:  Procedure Laterality Date  . BI-VENTRICULAR IMPLANTABLE CARDIOVERTER DEFIBRILLATOR UPGRADE N/A 12/25/2012   Procedure: BI-VENTRICULAR IMPLANTABLE CARDIOVERTER DEFIBRILLATOR UPGRADE;  Surgeon: Evans Lance, MD;  Location: [facility] CATH LAB;  Service:  Cardiovascular;  Laterality: N/A;  . BIV ICD UPGRADE  12/25/2012   MDT CRTD upgrade by Dr Lovena Le for ischemic cardiomyopathy and worsening conduction system disease  . CARDIAC CATHETERIZATION  06/2003,  01/2004  . CARDIAC CATHETERIZATION N/A 01/13/2016   Procedure: Left Heart Cath and Coronary Angiography;  Surgeon: Jettie Booze, MD;  Location: [facility] CV LAB;  Service: Cardiovascular;  Laterality: N/A;  . CARDIAC DEFIBRILLATOR PLACEMENT  05/24/2004   Implantation of a MDT single-chamber defibrillator  . CAROTID STENT  09/11/2003   Percutaneous transluminal angioplasty and stent placement of the left internal carotid artery.  Marland Kitchen CATARACT EXTRACTION W/ INTRAOCULAR LENS  IMPLANT, BILATERAL Bilateral ~ 2010  . CORONARY ANGIOPLASTY WITH STENT PLACEMENT  1990   "2" (12/25/2012)  . INSERT / REPLACE / REMOVE PACEMAKER    . LEAD REVISION N/A 12/25/2012   Procedure: LEAD REVISION;  Surgeon: Evans Lance, MD;  Location[facility] CATH LAB;  Service: Cardiovascular;  Laterality: N/A;     Current Outpatient Medications:  .  alum & mag hydroxide-simeth (MAALOX/MYLANTA) 200-200-20 MG/5ML suspension, Take 15 mLs by mouth every 6 (six) hours as needed for indigestion or heartburn., Disp: 355 mL, Rfl: 0 .  aspirin EC 81 MG tablet, Take 81 mg by mouth daily., Disp: , Rfl:  .  atorvastatin (LIPITOR) 40 MG tablet, TAKE 1 TABLET(40 MG) BY MOUTH DAILY AT 6 PM, Disp: 90 tablet, Rfl: 3 .  Blood Glucose Monitoring  Suppl (ACCU-CHEK AVIVA PLUS) w/Device KIT, Check finger stick glucose once daily, Disp: 1 kit, Rfl: 0 .  fluorometholone (FML) 0.1 % ophthalmic suspension, Place 2 drops into both eyes daily. , Disp: , Rfl:  .  furosemide (LASIX) 40 MG tablet, Take 1 tablet (40 mg total) by mouth 2 (two) times daily., Disp: 180 tablet, Rfl: 3 .  glucose blood (ACCU-CHEK AVIVA PLUS) test strip, Check blood sugar up to 3 times a day, Disp: 300 each, Rfl: 5 .  insulin glargine (LANTUS) 100 UNIT/ML injection, Inject 0.07 mLs (7  Units total) into the skin daily., Disp: 10 mL, Rfl: 11 .  Insulin Pen Needle (B-D ULTRAFINE III SHORT PEN) 31G X 8 MM MISC, USE ONCE DAILY WITH LANTUS PEN, Disp: 100 each, Rfl: 3 .  ofloxacin (OCUFLOX) 0.3 % ophthalmic solution, Place 1 drop into both eyes 4 (four) times daily., Disp: , Rfl:  .  ondansetron (ZOFRAN-ODT) 4 MG disintegrating tablet, Take 1 tablet (4 mg total) by mouth every 8 (eight) hours as needed for nausea or vomiting., Disp: 20 tablet, Rfl: 0 .  pantoprazole (PROTONIX) 40 MG tablet, TAKE 1 TABLET(40 MG) BY MOUTH DAILY, Disp: 90 tablet, Rfl: 1 .  polyethylene glycol (MIRALAX / GLYCOLAX) packet, Take 17 g by mouth daily., Disp: 14 each, Rfl: 0 .  senna-docusate (SENOKOT-S) 8.6-50 MG tablet, Take 2 tablets by mouth daily., Disp: 30 tablet, Rfl: 2 .  tamsulosin (FLOMAX) 0.4 MG CAPS capsule, Take 0.8 mg by mouth daily after supper., Disp: , Rfl:  .  VENTOLIN HFA 108 (90 Base) MCG/ACT inhaler, INHALE 2 PUFFS INTO THE LUNGS EVERY 6 HOURS AS NEEDED FOR WHEEZING OR SHORTNESS OF BREATH (Patient taking differently: Inhale 2 puffs into the lungs every 6 (six) hours as needed for wheezing or shortness of breath. ), Disp: 18 g, Rfl: 5  No Known Allergies        Objective:  Physical Exam  General: AAO x3, NAD  Dermatological: Nails are hypertrophic, dystrophic, brittle, discolored, elongated 10. No surrounding redness or drainage. Tenderness nails 1-5 bilaterally. No open lesions or pre-ulcerative lesions are identified today.   Vascular: DP, PT pulses decreased.  Neruologic: Sensation decreased with Semmes Weinstein monofilament  Musculoskeletal: No gross boney pedal deformities bilateral.      Assessment:   Symptomatic onychomycosis, PAD     Plan:  -Treatment options discussed including all alternatives, risks, and complications -Etiology of symptoms were discussed -Nails debrided 10 without complications or bleeding. -Daily foot inspection -Follow-up in 3 months or  sooner if any problems arise. In the meantime, encouraged to call the office with any questions, concerns, change in symptoms.   Celesta Gentile, DPM

## 2019-07-17 ENCOUNTER — Telehealth: Payer: Self-pay | Admitting: Student in an Organized Health Care Education/Training Program

## 2019-07-17 DIAGNOSIS — E11319 Type 2 diabetes mellitus with unspecified diabetic retinopathy without macular edema: Secondary | ICD-10-CM | POA: Diagnosis not present

## 2019-07-17 DIAGNOSIS — R338 Other retention of urine: Secondary | ICD-10-CM | POA: Diagnosis not present

## 2019-07-17 DIAGNOSIS — I130 Hypertensive heart and chronic kidney disease with heart failure and stage 1 through stage 4 chronic kidney disease, or unspecified chronic kidney disease: Secondary | ICD-10-CM | POA: Diagnosis not present

## 2019-07-17 DIAGNOSIS — R1312 Dysphagia, oropharyngeal phase: Secondary | ICD-10-CM | POA: Diagnosis not present

## 2019-07-17 DIAGNOSIS — N401 Enlarged prostate with lower urinary tract symptoms: Secondary | ICD-10-CM | POA: Diagnosis not present

## 2019-07-17 DIAGNOSIS — S81812D Laceration without foreign body, left lower leg, subsequent encounter: Secondary | ICD-10-CM | POA: Diagnosis not present

## 2019-07-17 DIAGNOSIS — N183 Chronic kidney disease, stage 3 unspecified: Secondary | ICD-10-CM | POA: Diagnosis not present

## 2019-07-17 DIAGNOSIS — I509 Heart failure, unspecified: Secondary | ICD-10-CM | POA: Diagnosis not present

## 2019-07-17 DIAGNOSIS — E1122 Type 2 diabetes mellitus with diabetic chronic kidney disease: Secondary | ICD-10-CM | POA: Diagnosis not present

## 2019-07-17 NOTE — Telephone Encounter (Signed)
Pls contact pt wife [phone]

## 2019-07-17 NOTE — Telephone Encounter (Signed)
Sister of pt calls and is quite concerned because of upcoming procedures. She would like to speak to dr Evette Doffing today if possible. Triage reassured her and assured her that the message would be given to dr Evette Doffing. Please call [phone] [name]

## 2019-07-17 NOTE — Telephone Encounter (Signed)
I spoke with patient's sister. I think it is ok to go ahead with eye surgery on Monday, should be low risk procedure. Though he is fragile due to advanced age and chronic illness, he is medically stable right now. If he is doing well after surgery, also ok to proceed with ENT biopsy on Friday. It is important to rule out throat malignancy given the changes in his voice. Sister understands, will call us mid next week if he is having trouble recovering.

## 2019-07-18 ENCOUNTER — Telehealth: Payer: Self-pay

## 2019-07-18 NOTE — Progress Notes (Signed)
EPIC Encounter for ICM Monitoring  Patient Name: [name] is a 85 y.o. male Date: 07/18/2019 Primary Care Physican: Axel Filler, MD Primary Cardiologist:Taylor Electrophysiologist: Santina Evans Pacing: 98% 05/27/2019 Weight:120lbs(office visit)  Attempted patient call and unable to reach.Transmission reviewed.  OptivolThoracic impedancesuggestive of possible ongoing fluid accumulation since 06/16/2019.   Prescribed:Furosemide 40 mg 1 tablet twice a day.   Labs: 05/26/2019 Creatinine 1.66, BUN 38, Potassium 4.2, Sodium 143, GFR 37-43 05/25/2019 Creatinine 1.65, BUN 43, Potassium 4.0, Sodium 141, GFR 37-43  05/24/2019 Creatinine 1.81, BUN 44, Potassium 3.9, Sodium 138, GFR 33-38  05/23/2019 Creatinine 2.17, BUN 51, Potassium 4.0, Sodium 136, GFR 27-31  05/22/2019 Creatinine 2.33, BUN 48, Potassium 4.7, Sodium 131, GFR 24-28  05/21/2019 Creatinine 2.26, BUN 41, Potassium 3.9, Sodium 136, GFR 25-29  A complete set of results can be found in Results Review.  Recommendations: Unable to reach.    Follow-up plan: ICM clinic phone appointment on 08/06/2019 to recheck fluid levels.   91 day device clinic remote transmission 08/06/2019.    Copy of ICM check sent to Dr. Lovena Le.   3 month ICM trend: 07/15/2019    1 Year ICM trend:       [name], RN 07/18/2019 1:34 PM

## 2019-07-18 NOTE — Telephone Encounter (Signed)
Remote ICM transmission received.  Attempted call to patient regarding ICM remote transmission and no answer.  

## 2019-07-21 ENCOUNTER — Other Ambulatory Visit: Payer: Self-pay | Admitting: Ophthalmology

## 2019-07-21 DIAGNOSIS — H04223 Epiphora due to insufficient drainage, bilateral lacrimal glands: Secondary | ICD-10-CM | POA: Diagnosis not present

## 2019-07-21 DIAGNOSIS — D3102 Benign neoplasm of left conjunctiva: Secondary | ICD-10-CM | POA: Diagnosis not present

## 2019-07-21 DIAGNOSIS — H02132 Senile ectropion of right lower eyelid: Secondary | ICD-10-CM | POA: Diagnosis not present

## 2019-07-21 DIAGNOSIS — H02105 Unspecified ectropion of left lower eyelid: Secondary | ICD-10-CM | POA: Diagnosis not present

## 2019-07-21 DIAGNOSIS — D487 Neoplasm of uncertain behavior of other specified sites: Secondary | ICD-10-CM | POA: Diagnosis not present

## 2019-07-21 DIAGNOSIS — H04553 Acquired stenosis of bilateral nasolacrimal duct: Secondary | ICD-10-CM | POA: Diagnosis not present

## 2019-07-21 DIAGNOSIS — H0234 Blepharochalasis left upper eyelid: Secondary | ICD-10-CM | POA: Diagnosis not present

## 2019-07-21 DIAGNOSIS — H0231 Blepharochalasis right upper eyelid: Secondary | ICD-10-CM | POA: Diagnosis not present

## 2019-07-21 DIAGNOSIS — H0232 Blepharochalasis right lower eyelid: Secondary | ICD-10-CM | POA: Diagnosis not present

## 2019-07-21 DIAGNOSIS — B309 Viral conjunctivitis, unspecified: Secondary | ICD-10-CM | POA: Diagnosis not present

## 2019-07-21 DIAGNOSIS — H02135 Senile ectropion of left lower eyelid: Secondary | ICD-10-CM | POA: Diagnosis not present

## 2019-07-22 ENCOUNTER — Encounter (HOSPITAL_COMMUNITY)
Admission: RE | Admit: 2019-07-22 | Discharge: 2019-07-22 | Disposition: A | Discharge status: Home / Self Care | Payer: Medicare HMO | Source: Ambulatory Visit | Attending: Otolaryngology | Admitting: Otolaryngology

## 2019-07-22 ENCOUNTER — Other Ambulatory Visit: Payer: Self-pay | Admitting: Otolaryngology

## 2019-07-22 ENCOUNTER — Other Ambulatory Visit: Payer: Self-pay

## 2019-07-22 ENCOUNTER — Encounter (HOSPITAL_COMMUNITY): Payer: Self-pay | Admitting: *Deleted

## 2019-07-22 ENCOUNTER — Inpatient Hospital Stay (HOSPITAL_COMMUNITY): Admission: RE | Admit: 2019-07-22 | Payer: Medicare HMO | Source: Ambulatory Visit

## 2019-07-22 DIAGNOSIS — Z01818 Encounter for other preprocedural examination: Secondary | ICD-10-CM | POA: Insufficient documentation

## 2019-07-22 DIAGNOSIS — E118 Type 2 diabetes mellitus with unspecified complications: Secondary | ICD-10-CM | POA: Diagnosis not present

## 2019-07-22 LAB — CBC
HCT: 36.5 % — ABNORMAL LOW (ref 39.0–52.0)
Hemoglobin: 11.3 g/dL — ABNORMAL LOW (ref 13.0–17.0)
MCH: 29.6 pg (ref 26.0–34.0)
MCHC: 31 g/dL (ref 30.0–36.0)
MCV: 95.5 fL (ref 80.0–100.0)
Platelets: 214 10*3/uL (ref 150–400)
RBC: 3.82 MIL/uL — ABNORMAL LOW (ref 4.22–5.81)
RDW: 15.4 % (ref 11.5–15.5)
WBC: 6.4 10*3/uL (ref 4.0–10.5)
nRBC: 0 % (ref 0.0–0.2)

## 2019-07-22 LAB — BASIC METABOLIC PANEL
Anion gap: 8 (ref 5–15)
BUN: 31 mg/dL — ABNORMAL HIGH (ref 8–23)
CO2: 23 mmol/L (ref 22–32)
Calcium: 9.4 mg/dL (ref 8.9–10.3)
Chloride: 105 mmol/L (ref 98–111)
Creatinine, Ser: 2.04 mg/dL — ABNORMAL HIGH (ref 0.61–1.24)
GFR calc Af Amer: 33 mL/min — ABNORMAL LOW (ref >60)
GFR calc non Af Amer: 29 mL/min — ABNORMAL LOW (ref >60)
Glucose, Bld: 138 mg/dL — ABNORMAL HIGH (ref 70–99)
Potassium: 4.7 mmol/L (ref 3.5–5.1)
Sodium: 136 mmol/L (ref 135–145)

## 2019-07-22 LAB — GLUCOSE, CAPILLARY: Glucose-Capillary: 140 mg/dL — ABNORMAL HIGH (ref 70–99)

## 2019-07-22 LAB — HEMOGLOBIN A1C
Hgb A1c MFr Bld: 6.8 % — ABNORMAL HIGH (ref 4.8–5.6)
Mean Plasma Glucose: 148.46 mg/dL

## 2019-07-22 NOTE — Pre-Procedure Instructions (Addendum)
[name]  07/22/2019     Your procedure is scheduled on October 30.  Report to [facility], Main Entrance or Entrance "A" at 0530 A.M.   Call this number if you have problems the morning of surgery: [phone]  This is the number for the Pre- Surgical Desk.   Remember:  Do not eat or drink after midnight Thursday, October 29   Take these medicines the morning of surgery with A SIP OF WATER : pantoprazole (PROTONIX)  Use Eye Drops. Take if needed: VENTOLIN HFA inhaler, bring it with you    If CBG is greater than 70 take 3 Units of Lantus      How to Manage Your Diabetes Before and After Surgery  Why is it important to control my blood sugar before and after surgery? . Improving blood sugar levels before and after surgery helps healing and can limit problems. . A way of improving blood sugar control is eating a healthy diet by: o  Eating less sugar and carbohydrates o  Increasing activity/exercise o  Talking with your doctor about reaching your blood sugar goals . High blood sugars (greater than 180 mg/dL) can raise your risk of infections and slow your recovery, so you will need to focus on controlling your diabetes during the weeks before surgery. . Make sure that the doctor who takes care of your diabetes knows about your planned surgery including the date and location.  How do I manage my blood sugar before surgery? . Check your blood sugar at least 4 times a day, starting 2 days before surgery, to make sure that the level is not too high or low. o Check your blood sugar the morning of your surgery when you wake up and every 2 hours until you get to the Short Stay unit. . If your blood sugar is less than 70 mg/dL, you will need to treat for low blood sugar: o Do not take insulin. o Treat a low blood sugar (less than 70 mg/dL) with  cup of clear juice (cranberry or apple), 4 glucose tablets, OR glucose gel. Recheck blood sugar in 15 minutes after  treatment (to make sure it is greater than 70 mg/dL). If your blood sugar is not greater than 70 mg/dL on recheck, call [phone] o  for further instructions. . Report your blood sugar to the short stay nurse when you get to Short Stay.  . If you are admitted to the hospital after surgery: o Your blood sugar will be checked by the staff and you will probably be given insulin after surgery (instead of oral diabetes medicines) to make sure you have good blood sugar levels. o The goal for blood sugar control after surgery is 80-180 mg/d .   Marland Kitchen The day of surgery, do not take other diabetes injectables, including Byetta (exenatide), Bydureon (exenatide ER), Victoza (liraglutide), or Trulicity (dulaglutide).   [facility]- Preparing For Surgery  Before surgery, you can play an important role. Because skin is not sterile, your skin needs to be as free of germs as possible. You can reduce the number of germs on your skin by washing with CHG (chlorahexidine gluconate) Soap before surgery.  CHG is an antiseptic cleaner which kills germs and bonds with the skin to continue killing germs even after washing.    Oral Hygiene is also important to reduce your risk of infection.  Remember - BRUSH YOUR TEETH THE MORNING OF SURGERY WITH YOUR REGULAR TOOTHPASTE  Please  do not use if you have an allergy to CHG or antibacterial soaps. If your skin becomes reddened/irritated stop using the CHG.  Do not shave (including legs and underarms) for at least 48 hours prior to first CHG shower. It is OK to shave your face.  Please follow these instructions carefully.   1. Shower the NIGHT BEFORE SURGERY and the MORNING OF SURGERY with CHG.   2. If you chose to wash your hair, wash your hair first as usual with your normal shampoo.  3. After you shampoo, wash your face and private area with the soap you use at home, then rinse your hair and body thoroughly to remove the shampoo and soap.   4. Use CHG as you would  any other liquid soap. You can apply CHG directly to the skin and wash gently with a scrungie or a clean washcloth.   Apply the CHG Soap to your body ONLY FROM THE NECK DOWN.  Do not use on open wounds or open sores. Avoid contact with your eyes, ears, mouth and genitals (private parts). 5. Wash thoroughly, paying special attention to the area where your surgery will be performed.  6. Thoroughly rinse your body with warm water from the neck down.  7. DO NOT shower/wash with your normal soap after using and rinsing off the CHG Soap.  8. Pat yourself dry with a CLEAN TOWEL.  9. Wear CLEAN PAJAMAS to bed the night before surgery, wear comfortable clothes the morning of surgery  10. Place CLEAN SHEETS on your bed the night of your first shower and DO NOT SLEEP WITH PETS. 11.  Day of Surgery: Shower as instructed above. Do not wear lotions, powders, or colonges, or deodorant. Please wear clean clothes to the hospital/surgery center.   Remember to brush your teeth WITH YOUR REGULAR TOOTHPASTE.            Do not wear jewelry           Men may shave face and neck.  Do not bring valuables to the hospital.  [facility] is not responsible for any belongings or valuables.  Contacts, dentures or bridgework may not be worn into surgery.  Leave your suitcase in the car.  After surgery it may be brought to your room.  For patients admitted to the hospital, discharge time will be determined by your treatment team.  Patients discharged the day of surgery will not be allowed to drive home.   Please read over the following fact sheets that you were given. Pain Booklet, Coughing and deepdeep breathingSurgical Site Infections.

## 2019-07-22 NOTE — Progress Notes (Addendum)
PCP - Dr Peyton Bottoms.[facility]   Cardiologist - Dr Lovena Le  Chest x-ray - na  EKG - 07/22/2019  Pacemaker/ICD- I faxed orders to the [facility] and informed [employer] reps that we will let them know if they are needed the day of surgery. Stress Test - no  ECHO - 2017  Cardiac Cath - 2017  Sleep Study - no CPAP - no  LABS-CBC, Bmp  ASA- 81mg , patient was not given instructions, I call Dr Redmond Baseman scheduler ,Ms Edsel Petrin and left a message asking the office to call patient with instructions.  ERAS-no  HA1C-  Fasting Blood Sugar -110 Checks Blood Sugar __2___ times a day  Anesthesia-Creatine 2..0 from 1.6, patient states he does not see a nephrologist  Pt denies having chest pain, sob, or fever at this time. All instructions explained to the pt, with a verbal understanding of the material. Pt agrees to go over the instructions while at home for a better understanding. Pt also instructed to self quarantine after being tested for COVID-19. Patient will be tested today.The opportunity to ask questions was provided.  I called Ms Edsel Petrin today and asked that the w/  be changed in the consent, as it is not an approved abbreviation.  Mr. Vorndran is severely HOH. Patient said that he will probably have his sister sign consent. Mr Daywalt also said that his sister [name] has [place] of [place] and his Living Will, Ms Jimmye Norman denied having it, but she took information to fill out.

## 2019-07-23 ENCOUNTER — Encounter (HOSPITAL_COMMUNITY): Payer: Self-pay | Admitting: Physician Assistant

## 2019-07-23 NOTE — Progress Notes (Addendum)
Anesthesia Chart Review: Follows with cardiology, Dr Lovena Le, for CAD s/p PCI and chronic systolic heart failure s/p ICD insertion. He has a h/o VT. Last seen 02/11/19, doing well at that time, no changes to therapy. He was cleared by Dr. Lovena Le to hold ASA for surgery. Last remote 05/05/19 showed normal device function.   Cleared by PCP 07/17/19. Per note "I think it is ok to go ahead with eye surgery on Monday, should be low risk procedure. Though he is fragile due to advanced age and chronic illness, he is medically stable right now. If he is doing well after surgery, also ok to proceed with ENT biopsy on Friday. It is important to rule out throat malignancy given the changes in his voice."  Hx of CKDIII, baseline creatinine ~2.0. Preop labs show creatinine 2.04. DMII well controlled with A1c 6.8.  Cath 01/13/16:  Mid Cx to Dist Cx , 40 % instent restenosis.  Chronically occluded LAD. Left to left collaterals.  Mid RCA lesion, 50% stenosed.  Mildly increased LVEDP.   No significant change from 2009 cath.  Circumflex stent still patent with mild instent restenosis.  Continue medical therapy.  Gentle hydration post cath to preserve renal function.   TTE 01/09/16: - Left ventricle: The cavity size was normal. Wall thickness was   normal. Systolic function was moderately to severely reduced. The   estimated ejection fraction was in the range of 30% to 35%.   Akinesis of the mid-apicalanteroseptal, anterior, inferior, and   apical myocardium; consistent with infarction in the distribution   of the left anterior descending coronary artery. Doppler   parameters are consistent with abnormal left ventricular   relaxation (grade 1 diastolic dysfunction). Acoustic contrast   opacification revealed no evidence ofthrombus. - Ventricular septum: Septal motion showed paradox. These changes   are consistent with right ventricular pacing. - Aortic valve: There was mild stenosis. Valve area (VTI): 1.26  cm^2. Valve area (Vmax): 1.41 cm^2. Valve area (Vmean): 1.25   cm^2. - Pericardium, extracardiac: A trivial pericardial effusion was   identified posterior to the heart.     Wynonia Musty [facility] Short Stay Center/Anesthesiology Phone [phone] 07/23/2019 12:08 PM

## 2019-07-23 NOTE — Anesthesia Preprocedure Evaluation (Deleted)
Anesthesia Evaluation    Airway        Dental   Pulmonary former smoker,           Cardiovascular hypertension,      Neuro/Psych    GI/Hepatic   Endo/Other  diabetes  Renal/GU      Musculoskeletal   Abdominal   Peds  Hematology   Anesthesia Other Findings   Reproductive/Obstetrics                             Anesthesia Physical Anesthesia Plan  ASA:   Anesthesia Plan:    Post-op Pain Management:    Induction:   PONV Risk Score and Plan:   Airway Management Planned:   Additional Equipment:   Intra-op Plan:   Post-operative Plan:   Informed Consent:   Plan Discussed with:   Anesthesia Plan Comments: (Follows with cardiology, Dr Lovena Le, for CAD s/p PCI and chronic systolic heart failure s/p ICD insertion. He has a h/o VT. Last seen 02/11/19, doing well at that time, no changes to therapy. He was cleared by Dr. Lovena Le to hold ASA for surgery. Last remote 05/05/19 showed normal device function.   Cleared by PCP 07/17/19. Per note "I think it is ok to go ahead with eye surgery on Monday, should be low risk procedure. Though he is fragile due to advanced age and chronic illness, he is medically stable right now. If he is doing well after surgery, also ok to proceed with ENT biopsy on Friday. It is important to rule out throat malignancy given the changes in his voice."  Hx of CKDIII, baseline creatinine ~2.0. Preop labs show creatinine 2.04. DMII well controlled with A1c 6.8.  Cath 01/13/16:  Mid Cx to Dist Cx , 40 % instent restenosis.  Chronically occluded LAD. Left to left collaterals.  Mid RCA lesion, 50% stenosed.  Mildly increased LVEDP.   No significant change from 2009 cath.  Circumflex stent still patent with mild instent restenosis.  Continue medical therapy.  Gentle hydration post cath to preserve renal function.   TTE 01/09/16: - Left ventricle: The cavity size was  normal. Wall thickness was   normal. Systolic function was moderately to severely reduced. The   estimated ejection fraction was in the range of 30% to 35%.   Akinesis of the mid-apicalanteroseptal, anterior, inferior, and   apical myocardium; consistent with infarction in the distribution   of the left anterior descending coronary artery. Doppler   parameters are consistent with abnormal left ventricular   relaxation (grade 1 diastolic dysfunction). Acoustic contrast   opacification revealed no evidence ofthrombus. - Ventricular septum: Septal motion showed paradox. These changes   are consistent with right ventricular pacing. - Aortic valve: There was mild stenosis. Valve area (VTI): 1.26   cm^2. Valve area (Vmax): 1.41 cm^2. Valve area (Vmean): 1.25   cm^2. - Pericardium, extracardiac: A trivial pericardial effusion was   identified posterior to the heart.)       Anesthesia Quick Evaluation

## 2019-07-24 ENCOUNTER — Other Ambulatory Visit (HOSPITAL_COMMUNITY)
Admission: RE | Admit: 2019-07-24 | Discharge: 2019-07-24 | Disposition: A | Discharge status: Home / Self Care | Payer: Medicare HMO | Source: Ambulatory Visit | Attending: Otolaryngology | Admitting: Otolaryngology

## 2019-07-24 DIAGNOSIS — Z20828 Contact with and (suspected) exposure to other viral communicable diseases: Secondary | ICD-10-CM | POA: Insufficient documentation

## 2019-07-24 DIAGNOSIS — Z01812 Encounter for preprocedural laboratory examination: Secondary | ICD-10-CM | POA: Insufficient documentation

## 2019-07-24 LAB — SARS CORONAVIRUS 2 (TAT 6-24 HRS): SARS Coronavirus 2: NEGATIVE

## 2019-07-25 ENCOUNTER — Encounter (HOSPITAL_COMMUNITY): Admission: RE | Payer: Self-pay | Source: Home / Self Care

## 2019-07-25 ENCOUNTER — Ambulatory Visit (INDEPENDENT_AMBULATORY_CARE_PROVIDER_SITE_OTHER)
Admission: EM | Admit: 2019-07-25 | Discharge: 2019-07-25 | Disposition: A | Discharge status: Home / Self Care | Payer: Medicare HMO | Source: Home / Self Care | Attending: Family Medicine | Admitting: Family Medicine

## 2019-07-25 ENCOUNTER — Other Ambulatory Visit: Payer: Self-pay

## 2019-07-25 ENCOUNTER — Telehealth: Payer: Self-pay | Admitting: *Deleted

## 2019-07-25 ENCOUNTER — Ambulatory Visit (HOSPITAL_COMMUNITY): Admission: RE | Admit: 2019-07-25 | Payer: Medicare HMO | Source: Home / Self Care | Admitting: Otolaryngology

## 2019-07-25 ENCOUNTER — Encounter (HOSPITAL_COMMUNITY): Payer: Self-pay

## 2019-07-25 DIAGNOSIS — K6289 Other specified diseases of anus and rectum: Secondary | ICD-10-CM | POA: Diagnosis not present

## 2019-07-25 HISTORY — DX: Pneumonia, unspecified organism: J18.9

## 2019-07-25 HISTORY — DX: Personal history of urinary calculi: Z87.442

## 2019-07-25 SURGERY — MICROLARYNGOSCOPY
Anesthesia: General

## 2019-07-25 MED ORDER — HYDROCORTISONE (PERIANAL) 1 % EX CREA: 1.0000 "application " | TOPICAL_CREAM | Freq: Two times a day (BID) | CUTANEOUS | 0 refills | Status: DC

## 2019-07-25 MED ORDER — LIDOCAINE HCL URETHRAL/MUCOSAL 2 % EX GEL: 1.0000 "application " | CUTANEOUS | 0 refills | Status: DC | PRN

## 2019-07-25 NOTE — Discharge Instructions (Signed)
Use moist wipes instead of paper After cleaning, apply hydrocortisone 2 x a day until pain improves May use the lidocaine for pain as often as needed Call your PCP for follow up

## 2019-07-25 NOTE — Telephone Encounter (Signed)
rtc to [facility], HHN states pt had a skin tear on his shin so they wanted a VO for assess and treat just in case he needed assist with it, informed her pt is in urg care and would need imc f/u so triage will try to make contact this pm w/ pt and will assess both rectal pain and skin tear on shin. She will call back mon pm

## 2019-07-25 NOTE — Telephone Encounter (Signed)
Nurse from [facility] calling for verbal orders.

## 2019-07-25 NOTE — ED Provider Notes (Signed)
[place]    CSN: [id number] Arrival date & time: 07/25/19  1153      History   Chief Complaint Chief Complaint  Patient presents with  . rectal pain    HPI [name] is a 85 y.o. male.   HPI  This is an 85 year old gentleman who is here for rectal pain.  Apparently he was scheduled to have a laryngoscopy today with possible biopsy because of vocal cord dysfunction/changes.  He canceled because of the rectal pain.  He states the rectal pain started yesterday.  He denies constipation.  Diarrhea.  Blood in bowels.  He states that it hurts when he has a bowel movement, even hurts at rest.  Denies any prior problem of rectal problems.  Denies hemorrhoids. Patient is an insulin-dependent diabetic with multiple medical problems including chronic kidney disease, atherosclerotic vascular disease, recent significant weight loss with malnutrition.  Past Medical History:  Diagnosis Date  . Adenomatous colon polyp 02/14/2012  . AICD (automatic cardioverter/defibrillator) present    Medtronic   . BBB (bundle branch block)    right  . Carotid stenosis    a. s/p L carotid stent 2004;  b. Carotid US (09/2014): Bilateral ICA 1-39%, left ECA >59%, normal subclavian bilaterally, occluded left vertebral >> FU 2 years  . Chronic kidney disease   . Chronic systolic CHF (congestive heart failure) (HCC)    a. ischemic CM EF 15-20%;  b. s/p AICD 05/24/04;  c. Echo 7/06: EF 30-40%, mild reduced RVSF, d. Echo 12/2015 EF 35-40%  . Elevated PSA   . History of kidney stones    x 1  . HTN (hypertension)   . Hyperlipidemia   . ICD (implantable cardiac defibrillator) in place 12-25-2012   MDT CRTD upgrade by Dr Lovena Le  . Myocardial infarction ([place]) 1990  . Pericardial effusion    Echocardiogram (09/2014): EF 25% with distal anterior, distal inferior, distal lateral and apical akinesis, grade 1 diastolic dysfunction, very mild aortic stenosis (mean 7 mmHg) - this may be depressed due to low  EF (2-D images suggest mild to moderate aortic stenosis), large pericardial effusion, no RA collapse  . Pneumonia   . Presence of permanent cardiac pacemaker    Medtronic  . PVD (peripheral vascular disease) ([place])    s/p L carotid PTCA/stent 2004  . Transient ischemic attack   . Type II diabetes mellitus ([place])    type II    Patient Active Problem List   Diagnosis Date Noted  . Protein-calorie malnutrition, severe 05/21/2019  . Acute encephalopathy 05/20/2019  . Hypertensive retinopathy of both eyes 02/05/2018  . Mild nonproliferative diabetic retinopathy of both eyes without macular edema associated with type 2 diabetes mellitus ([place]) 02/05/2018  . Sensorineural hearing loss (SNHL), bilateral 12/11/2017  . Dry eye syndrome of both eyes 06/06/2017  . Weakness generalized   . Weight loss 12/11/2016  . Advanced care planning/counseling discussion 12/11/2016  . Hypoglycemia 01/27/2016  . Urinary retention 01/13/2016  . Healthcare maintenance 11/22/2015  . [name] (chronic kidney disease) stage 3, GFR 30-59 ml/min (HCC) 12/04/2014  . Automatic implantable cardioverter-defibrillator in situ 01/16/2012  . Constipation 03/10/2009  . Coronary atherosclerosis 12/25/2007  . Chronic systolic congestive heart failure ([place]) 12/25/2007  . Hyperlipidemia 01/02/2007  . Type 2 diabetes mellitus with peripheral vascular disease ([place]) 01/01/1989    Past Surgical History:  Procedure Laterality Date  . BI-VENTRICULAR IMPLANTABLE CARDIOVERTER DEFIBRILLATOR UPGRADE N/A 12/25/2012   Procedure: BI-VENTRICULAR IMPLANTABLE CARDIOVERTER DEFIBRILLATOR UPGRADE;  Surgeon:  Evans Lance, MD;  Location: [facility] CATH LAB;  Service: Cardiovascular;  Laterality: N/A;  . BIV ICD UPGRADE  12/25/2012   MDT CRTD upgrade by Dr Lovena Le for ischemic cardiomyopathy and worsening conduction system disease  . CARDIAC CATHETERIZATION  06/2003,  01/2004  . CARDIAC CATHETERIZATION N/A 01/13/2016   Procedure: Left Heart Cath and Coronary  Angiography;  Surgeon: Jettie Booze, MD;  Location: [facility] CV LAB;  Service: Cardiovascular;  Laterality: N/A;  . CARDIAC DEFIBRILLATOR PLACEMENT  05/24/2004   Implantation of a MDT single-chamber defibrillator  . CAROTID STENT  09/11/2003   Percutaneous transluminal angioplasty and stent placement of the left internal carotid artery.  Marland Kitchen CATARACT EXTRACTION W/ INTRAOCULAR LENS  IMPLANT, BILATERAL Bilateral ~ 2010  . CORONARY ANGIOPLASTY WITH STENT PLACEMENT  1990   "2" (12/25/2012)  . CYSTOSCOPY    . INSERT / REPLACE / REMOVE PACEMAKER    . LEAD REVISION N/A 12/25/2012   Procedure: LEAD REVISION;  Surgeon: Evans Lance, MD;  Location: [facility] CATH LAB;  Service: Cardiovascular;  Laterality: N/A;       Home Medications    Prior to Admission medications   Medication Sig Start Date End Date Taking? Authorizing Provider  alum & mag hydroxide-simeth (MAALOX/MYLANTA) 200-200-20 MG/5ML suspension Take 15 mLs by mouth every 6 (six) hours as needed for indigestion or heartburn. 03/27/19   Wieters, Hallie C, PA-C  aspirin EC 81 MG tablet Take 81 mg by mouth daily.    [provider]  atorvastatin (LIPITOR) 40 MG tablet TAKE 1 TABLET(40 MG) BY MOUTH DAILY AT 6 PM Patient taking differently: Take 40 mg by mouth daily at 6 PM.  03/03/19   Axel Filler, MD  Blood Glucose Monitoring Suppl (ACCU-CHEK AVIVA PLUS) w/Device KIT Check finger stick glucose once daily 05/06/18   Axel Filler, MD  carvedilol (COREG) 12.5 MG tablet Take 12.5 mg by mouth daily before breakfast.    [provider]  furosemide (LASIX) 40 MG tablet Take 1 tablet (40 mg total) by mouth 2 (two) times daily. Patient taking differently: Take 20 mg by mouth 2 (two) times daily.  04/14/19   Evans Lance, MD  glucose blood (ACCU-CHEK AVIVA PLUS) test strip Check blood sugar up to 3 times a day 08/06/17   Axel Filler, MD  Hydrocortisone, Perianal, (PROCTO-PAK) 1 % CREA Apply 1 application  topically 2 (two) times daily. 07/25/19   Raylene Everts, MD  insulin glargine (LANTUS) 100 UNIT/ML injection Inject 0.07 mLs (7 Units total) into the skin daily. 05/27/19   Madalyn Rob, MD  Insulin Pen Needle (B-D ULTRAFINE III SHORT PEN) 31G X 8 MM MISC USE ONCE DAILY WITH LANTUS PEN 05/06/18   Axel Filler, MD  lidocaine (XYLOCAINE) 2 % jelly Apply 1 application topically as needed. 07/25/19   Raylene Everts, MD  neomycin-polymyxin b-dexamethasone (MAXITROL) 3.5-10000-0.1 OINT  07/17/19   [provider]  pantoprazole (PROTONIX) 40 MG tablet TAKE 1 TABLET(40 MG) BY MOUTH DAILY Patient taking differently: Take 40 mg by mouth daily.  03/11/19   Axel Filler, MD  polyethylene glycol [facility] / Floria Raveling) packet Take 17 g by mouth daily. Patient taking differently: Take 17 g by mouth daily as needed for mild constipation.  09/13/17   Jola Schmidt, MD  senna-docusate (SENOKOT-S) 8.6-50 MG tablet Take 2 tablets by mouth daily. Patient taking differently: Take 2 tablets by mouth daily as needed for mild constipation.  05/04/16   Saralyn Pilar,  Pilar Grammes, PA  tamsulosin (FLOMAX) 0.4 MG CAPS capsule Take 0.8 mg by mouth at bedtime.     [provider]  VENTOLIN HFA 108 (90 Base) MCG/ACT inhaler INHALE 2 PUFFS INTO THE LUNGS EVERY 6 HOURS AS NEEDED FOR WHEEZING OR SHORTNESS OF BREATH Patient taking differently: Inhale 2 puffs into the lungs every 6 (six) hours as needed for wheezing or shortness of breath.  03/15/16   Axel Filler, MD    Family History Family History  Problem Relation Age of Onset  . Diabetes Mother   . Healthy Father   . Diabetes Brother   . Heart attack Neg Hx   . Stroke Neg Hx     Social History Social History   Tobacco Use  . Smoking status: Former Smoker    Types: Cigarettes    Quit date: 12/27/1967    Years since quitting: 51.6  . Smokeless tobacco: Never Used  Substance Use Topics  . Alcohol use: No    Alcohol/week: 0.0  standard drinks    Comment: 12/25/2012 "quit all alcohol 60 yr ago"  . Drug use: No     Allergies   Patient has no known allergies.   Review of Systems Review of Systems  Constitutional: Positive for appetite change and unexpected weight change. Negative for chills and fever.  HENT: Positive for voice change. Negative for ear pain and sore throat.   Eyes: Negative for pain and visual disturbance.  Respiratory: Negative for cough and shortness of breath.   Cardiovascular: Negative for chest pain and palpitations.  Gastrointestinal: Positive for rectal pain. Negative for abdominal pain, blood in stool, constipation, diarrhea and vomiting.  Genitourinary: Negative for dysuria and hematuria.  Musculoskeletal: Negative for arthralgias and back pain.  Skin: Negative for color change and rash.  Neurological: Positive for weakness. Negative for seizures and syncope.  All other systems reviewed and are negative.    Physical Exam Triage Vital Signs ED Triage Vitals  Enc Vitals Group     BP 07/25/19 1211 112/71     Pulse Rate 07/25/19 1211 90     Resp 07/25/19 1211 16     Temp 07/25/19 1211 98.3 F (36.8 C)     Temp Source 07/25/19 1211 Oral     SpO2 07/25/19 1211 98 %     Weight --      Height --      Head Circumference --      Peak Flow --      Pain Score 07/25/19 1213 10     Pain Loc --      Pain Edu? --      Excl. in [place]? --    No data found.  Updated Vital Signs BP 112/71 (BP Location: Left Arm)   Pulse 90   Temp 98.3 F (36.8 C) (Oral)   Resp 16   SpO2 98%      Physical Exam Constitutional:      General: He is not in acute distress.    Appearance: He is well-developed. He is ill-appearing.     Comments: Hard of hearing.  Slow to answer some questions.  Very thin.  Frail and debilitated.  reQuires assistance to stand from wheelchair  HENT:     Head: Normocephalic and atraumatic.  Eyes:     Conjunctiva/sclera: Conjunctivae normal.     Pupils: Pupils are equal,  round, and reactive to light.  Neck:     Musculoskeletal: Normal range of motion.  Cardiovascular:  Rate and Rhythm: Normal rate and regular rhythm.     Heart sounds: Murmur present.  Pulmonary:     Effort: Pulmonary effort is normal. No respiratory distress.     Comments: Crackles in bases.  Poor inspiration Abdominal:     General: There is no distension.     Palpations: Abdomen is soft.  Genitourinary:    Comments: Patient is in depends.  He has placed hand towels inside the diaper.  He has fecal incontinence.  After cleaning the area, rectum has poor tone, mild erythema at the superior edge (near tailbone).  No bleeding.  No fissure or hemorrhoid identified Musculoskeletal: Normal range of motion.  Skin:    General: Skin is warm and dry.  Neurological:     Mental Status: He is alert.      UC Treatments / Results  Labs (all labs ordered are listed, but only abnormal results are displayed) Labs Reviewed - No data to display  EKG   Radiology No results found.  Procedures Procedures (including critical care time)  Medications Ordered in UC Medications - No data to display  Initial Impression / Assessment and Plan / UC Course  I have reviewed the triage vital signs and the nursing notes.  Pertinent labs & imaging results that were available during my care of the patient were reviewed by me and considered in my medical decision making (see chart for details).     Rectal pain.  Some irritation of the skin seen but no frank fissure identified. Final Clinical Impressions(s) / UC Diagnoses   Final diagnoses:  Rectal pain     Discharge Instructions     Use moist wipes instead of paper After cleaning, apply hydrocortisone 2 x a day until pain improves May use the lidocaine for pain as often as needed Call your PCP for follow up   ED Prescriptions    Medication Sig Dispense Auth. Provider   Hydrocortisone, Perianal, (PROCTO-PAK) 1 % CREA Apply 1 application  topically 2 (two) times daily. 28 g Raylene Everts, MD   lidocaine (XYLOCAINE) 2 % jelly Apply 1 application topically as needed. 30 mL Raylene Everts, MD     PDMP not reviewed this encounter.   Raylene Everts, MD 07/25/19 [phone]

## 2019-07-25 NOTE — ED Triage Notes (Signed)
Pt presents to UC w/ c/o rectal pain since yesterday. Pt denies bleeding, constipation, or diarrhea.

## 2019-07-27 ENCOUNTER — Encounter (HOSPITAL_COMMUNITY): Payer: Self-pay

## 2019-07-27 ENCOUNTER — Inpatient Hospital Stay (HOSPITAL_COMMUNITY)
Admission: EM | Admit: 2019-07-27 | Discharge: 2019-07-31 | DRG: 725 | Disposition: A | Discharge status: Home Health | Payer: Medicare HMO | Attending: Internal Medicine | Admitting: Internal Medicine

## 2019-07-27 ENCOUNTER — Emergency Department (HOSPITAL_COMMUNITY): Payer: Medicare HMO

## 2019-07-27 ENCOUNTER — Other Ambulatory Visit: Payer: Self-pay

## 2019-07-27 DIAGNOSIS — E785 Hyperlipidemia, unspecified: Secondary | ICD-10-CM | POA: Diagnosis present

## 2019-07-27 DIAGNOSIS — Z794 Long term (current) use of insulin: Secondary | ICD-10-CM

## 2019-07-27 DIAGNOSIS — Z8673 Personal history of transient ischemic attack (TIA), and cerebral infarction without residual deficits: Secondary | ICD-10-CM

## 2019-07-27 DIAGNOSIS — N401 Enlarged prostate with lower urinary tract symptoms: Secondary | ICD-10-CM | POA: Diagnosis not present

## 2019-07-27 DIAGNOSIS — R338 Other retention of urine: Secondary | ICD-10-CM | POA: Diagnosis not present

## 2019-07-27 DIAGNOSIS — Z95810 Presence of automatic (implantable) cardiac defibrillator: Secondary | ICD-10-CM

## 2019-07-27 DIAGNOSIS — N1339 Other hydronephrosis: Secondary | ICD-10-CM | POA: Diagnosis present

## 2019-07-27 DIAGNOSIS — H903 Sensorineural hearing loss, bilateral: Secondary | ICD-10-CM | POA: Diagnosis present

## 2019-07-27 DIAGNOSIS — N400 Enlarged prostate without lower urinary tract symptoms: Secondary | ICD-10-CM

## 2019-07-27 DIAGNOSIS — E43 Unspecified severe protein-calorie malnutrition: Secondary | ICD-10-CM | POA: Diagnosis not present

## 2019-07-27 DIAGNOSIS — I5022 Chronic systolic (congestive) heart failure: Secondary | ICD-10-CM | POA: Diagnosis not present

## 2019-07-27 DIAGNOSIS — Z20828 Contact with and (suspected) exposure to other viral communicable diseases: Secondary | ICD-10-CM | POA: Diagnosis not present

## 2019-07-27 DIAGNOSIS — K6289 Other specified diseases of anus and rectum: Secondary | ICD-10-CM | POA: Diagnosis present

## 2019-07-27 DIAGNOSIS — E11649 Type 2 diabetes mellitus with hypoglycemia without coma: Secondary | ICD-10-CM | POA: Diagnosis not present

## 2019-07-27 DIAGNOSIS — R159 Full incontinence of feces: Secondary | ICD-10-CM | POA: Diagnosis present

## 2019-07-27 DIAGNOSIS — Z7982 Long term (current) use of aspirin: Secondary | ICD-10-CM

## 2019-07-27 DIAGNOSIS — E113293 Type 2 diabetes mellitus with mild nonproliferative diabetic retinopathy without macular edema, bilateral: Secondary | ICD-10-CM | POA: Diagnosis present

## 2019-07-27 DIAGNOSIS — K5909 Other constipation: Secondary | ICD-10-CM | POA: Diagnosis present

## 2019-07-27 DIAGNOSIS — I130 Hypertensive heart and chronic kidney disease with heart failure and stage 1 through stage 4 chronic kidney disease, or unspecified chronic kidney disease: Secondary | ICD-10-CM | POA: Diagnosis present

## 2019-07-27 DIAGNOSIS — I252 Old myocardial infarction: Secondary | ICD-10-CM | POA: Diagnosis not present

## 2019-07-27 DIAGNOSIS — I2510 Atherosclerotic heart disease of native coronary artery without angina pectoris: Secondary | ICD-10-CM | POA: Diagnosis present

## 2019-07-27 DIAGNOSIS — N1330 Unspecified hydronephrosis: Secondary | ICD-10-CM | POA: Diagnosis present

## 2019-07-27 DIAGNOSIS — E1122 Type 2 diabetes mellitus with diabetic chronic kidney disease: Secondary | ICD-10-CM | POA: Diagnosis present

## 2019-07-27 DIAGNOSIS — K5900 Constipation, unspecified: Secondary | ICD-10-CM | POA: Diagnosis not present

## 2019-07-27 DIAGNOSIS — Z87442 Personal history of urinary calculi: Secondary | ICD-10-CM

## 2019-07-27 DIAGNOSIS — Z833 Family history of diabetes mellitus: Secondary | ICD-10-CM

## 2019-07-27 DIAGNOSIS — Z79899 Other long term (current) drug therapy: Secondary | ICD-10-CM

## 2019-07-27 DIAGNOSIS — E11319 Type 2 diabetes mellitus with unspecified diabetic retinopathy without macular edema: Secondary | ICD-10-CM | POA: Diagnosis present

## 2019-07-27 DIAGNOSIS — Z681 Body mass index (BMI) 19 or less, adult: Secondary | ICD-10-CM

## 2019-07-27 DIAGNOSIS — K529 Noninfective gastroenteritis and colitis, unspecified: Secondary | ICD-10-CM

## 2019-07-27 DIAGNOSIS — N179 Acute kidney failure, unspecified: Secondary | ICD-10-CM | POA: Diagnosis not present

## 2019-07-27 DIAGNOSIS — I255 Ischemic cardiomyopathy: Secondary | ICD-10-CM | POA: Diagnosis present

## 2019-07-27 DIAGNOSIS — Z95828 Presence of other vascular implants and grafts: Secondary | ICD-10-CM

## 2019-07-27 DIAGNOSIS — R490 Dysphonia: Secondary | ICD-10-CM | POA: Diagnosis present

## 2019-07-27 DIAGNOSIS — N1832 Chronic kidney disease, stage 3b: Secondary | ICD-10-CM | POA: Diagnosis present

## 2019-07-27 DIAGNOSIS — Z03818 Encounter for observation for suspected exposure to other biological agents ruled out: Secondary | ICD-10-CM | POA: Diagnosis not present

## 2019-07-27 DIAGNOSIS — E1151 Type 2 diabetes mellitus with diabetic peripheral angiopathy without gangrene: Secondary | ICD-10-CM | POA: Diagnosis present

## 2019-07-27 DIAGNOSIS — Z955 Presence of coronary angioplasty implant and graft: Secondary | ICD-10-CM

## 2019-07-27 DIAGNOSIS — Z87891 Personal history of nicotine dependence: Secondary | ICD-10-CM

## 2019-07-27 DIAGNOSIS — Z86010 Personal history of colonic polyps: Secondary | ICD-10-CM

## 2019-07-27 MED ORDER — ACETAMINOPHEN 500 MG PO TABS
1000.0000 mg | ORAL_TABLET | Freq: Once | ORAL | Status: AC
  Administered 2019-07-28: 1000 mg via ORAL
  Filled 2019-07-27: qty 2

## 2019-07-27 NOTE — ED Triage Notes (Signed)
Pt returns today with pain in rectal area. Pt states that the pain has gotten worse since Friday.

## 2019-07-28 ENCOUNTER — Emergency Department (HOSPITAL_COMMUNITY): Payer: Medicare HMO

## 2019-07-28 ENCOUNTER — Other Ambulatory Visit: Payer: Self-pay

## 2019-07-28 ENCOUNTER — Telehealth: Payer: Self-pay

## 2019-07-28 ENCOUNTER — Encounter (HOSPITAL_COMMUNITY): Payer: Self-pay | Admitting: Emergency Medicine

## 2019-07-28 DIAGNOSIS — E11319 Type 2 diabetes mellitus with unspecified diabetic retinopathy without macular edema: Secondary | ICD-10-CM | POA: Diagnosis present

## 2019-07-28 DIAGNOSIS — N1832 Chronic kidney disease, stage 3b: Secondary | ICD-10-CM | POA: Diagnosis present

## 2019-07-28 DIAGNOSIS — I5022 Chronic systolic (congestive) heart failure: Secondary | ICD-10-CM | POA: Diagnosis present

## 2019-07-28 DIAGNOSIS — N1330 Unspecified hydronephrosis: Secondary | ICD-10-CM | POA: Diagnosis present

## 2019-07-28 DIAGNOSIS — E1151 Type 2 diabetes mellitus with diabetic peripheral angiopathy without gangrene: Secondary | ICD-10-CM | POA: Diagnosis present

## 2019-07-28 DIAGNOSIS — N400 Enlarged prostate without lower urinary tract symptoms: Secondary | ICD-10-CM | POA: Diagnosis not present

## 2019-07-28 DIAGNOSIS — E1122 Type 2 diabetes mellitus with diabetic chronic kidney disease: Secondary | ICD-10-CM | POA: Diagnosis present

## 2019-07-28 DIAGNOSIS — I130 Hypertensive heart and chronic kidney disease with heart failure and stage 1 through stage 4 chronic kidney disease, or unspecified chronic kidney disease: Secondary | ICD-10-CM | POA: Diagnosis present

## 2019-07-28 DIAGNOSIS — K5909 Other constipation: Secondary | ICD-10-CM | POA: Diagnosis present

## 2019-07-28 DIAGNOSIS — N179 Acute kidney failure, unspecified: Secondary | ICD-10-CM | POA: Diagnosis present

## 2019-07-28 DIAGNOSIS — N401 Enlarged prostate with lower urinary tract symptoms: Secondary | ICD-10-CM | POA: Diagnosis present

## 2019-07-28 DIAGNOSIS — R490 Dysphonia: Secondary | ICD-10-CM | POA: Diagnosis present

## 2019-07-28 DIAGNOSIS — N1339 Other hydronephrosis: Secondary | ICD-10-CM | POA: Diagnosis present

## 2019-07-28 DIAGNOSIS — E785 Hyperlipidemia, unspecified: Secondary | ICD-10-CM | POA: Diagnosis present

## 2019-07-28 DIAGNOSIS — I2510 Atherosclerotic heart disease of native coronary artery without angina pectoris: Secondary | ICD-10-CM | POA: Diagnosis present

## 2019-07-28 DIAGNOSIS — R338 Other retention of urine: Secondary | ICD-10-CM | POA: Diagnosis present

## 2019-07-28 DIAGNOSIS — I252 Old myocardial infarction: Secondary | ICD-10-CM | POA: Diagnosis not present

## 2019-07-28 DIAGNOSIS — I255 Ischemic cardiomyopathy: Secondary | ICD-10-CM | POA: Diagnosis present

## 2019-07-28 DIAGNOSIS — K6289 Other specified diseases of anus and rectum: Secondary | ICD-10-CM | POA: Diagnosis present

## 2019-07-28 DIAGNOSIS — Z681 Body mass index (BMI) 19 or less, adult: Secondary | ICD-10-CM | POA: Diagnosis not present

## 2019-07-28 DIAGNOSIS — E43 Unspecified severe protein-calorie malnutrition: Secondary | ICD-10-CM | POA: Diagnosis present

## 2019-07-28 DIAGNOSIS — E113293 Type 2 diabetes mellitus with mild nonproliferative diabetic retinopathy without macular edema, bilateral: Secondary | ICD-10-CM | POA: Diagnosis present

## 2019-07-28 DIAGNOSIS — R159 Full incontinence of feces: Secondary | ICD-10-CM | POA: Diagnosis present

## 2019-07-28 DIAGNOSIS — E11649 Type 2 diabetes mellitus with hypoglycemia without coma: Secondary | ICD-10-CM | POA: Diagnosis not present

## 2019-07-28 DIAGNOSIS — Z20828 Contact with and (suspected) exposure to other viral communicable diseases: Secondary | ICD-10-CM | POA: Diagnosis present

## 2019-07-28 DIAGNOSIS — H903 Sensorineural hearing loss, bilateral: Secondary | ICD-10-CM | POA: Diagnosis present

## 2019-07-28 DIAGNOSIS — K5900 Constipation, unspecified: Secondary | ICD-10-CM | POA: Diagnosis not present

## 2019-07-28 LAB — GLUCOSE, CAPILLARY
Glucose-Capillary: 88 mg/dL (ref 70–99)
Glucose-Capillary: 83 mg/dL (ref 70–99)
Glucose-Capillary: 120 mg/dL — ABNORMAL HIGH (ref 70–99)

## 2019-07-28 LAB — URINALYSIS, ROUTINE W REFLEX MICROSCOPIC
Bilirubin Urine: NEGATIVE
Glucose, UA: NEGATIVE mg/dL
Hgb urine dipstick: NEGATIVE
Ketones, ur: 5 mg/dL — AB
Leukocytes,Ua: NEGATIVE
Nitrite: NEGATIVE
Protein, ur: NEGATIVE mg/dL
Specific Gravity, Urine: 1.012 (ref 1.005–1.030)
pH: 5 (ref 5.0–8.0)

## 2019-07-28 LAB — I-STAT CHEM 8, ED
BUN: 42 mg/dL — ABNORMAL HIGH (ref 8–23)
Calcium, Ion: 1.3 mmol/L (ref 1.15–1.40)
Chloride: 105 mmol/L (ref 98–111)
Creatinine, Ser: 2.2 mg/dL — ABNORMAL HIGH (ref 0.61–1.24)
Glucose, Bld: 133 mg/dL — ABNORMAL HIGH (ref 70–99)
HCT: 44 % (ref 39.0–52.0)
Hemoglobin: 15 g/dL (ref 13.0–17.0)
Potassium: 4.9 mmol/L (ref 3.5–5.1)
Sodium: 140 mmol/L (ref 135–145)
TCO2: 27 mmol/L (ref 22–32)

## 2019-07-28 LAB — CBC WITH DIFFERENTIAL/PLATELET
Abs Immature Granulocytes: 0.02 10*3/uL (ref 0.00–0.07)
Basophils Absolute: 0.1 10*3/uL (ref 0.0–0.1)
Basophils Relative: 1 %
Eosinophils Absolute: 0.1 10*3/uL (ref 0.0–0.5)
Eosinophils Relative: 1 %
HCT: 43.9 % (ref 39.0–52.0)
Hemoglobin: 13.5 g/dL (ref 13.0–17.0)
Immature Granulocytes: 0 %
Lymphocytes Relative: 19 %
Lymphs Abs: 1.3 10*3/uL (ref 0.7–4.0)
MCH: 29.3 pg (ref 26.0–34.0)
MCHC: 30.8 g/dL (ref 30.0–36.0)
MCV: 95.2 fL (ref 80.0–100.0)
Monocytes Absolute: 0.7 10*3/uL (ref 0.1–1.0)
Monocytes Relative: 10 %
Neutro Abs: 4.9 10*3/uL (ref 1.7–7.7)
Neutrophils Relative %: 69 %
Platelets: 249 10*3/uL (ref 150–400)
RBC: 4.61 MIL/uL (ref 4.22–5.81)
RDW: 15 % (ref 11.5–15.5)
WBC: 7.1 10*3/uL (ref 4.0–10.5)
nRBC: 0 % (ref 0.0–0.2)

## 2019-07-28 LAB — LACTIC ACID, PLASMA: Lactic Acid, Venous: 1.1 mmol/L (ref 0.5–1.9)

## 2019-07-28 LAB — SARS CORONAVIRUS 2 (TAT 6-24 HRS): SARS Coronavirus 2: NEGATIVE

## 2019-07-28 MED ORDER — PIPERACILLIN-TAZOBACTAM 3.375 G IVPB 30 MIN
3.3750 g | Freq: Once | INTRAVENOUS | Status: AC
  Administered 2019-07-28: 3.375 g via INTRAVENOUS
  Filled 2019-07-28: qty 50

## 2019-07-28 MED ORDER — MAGNESIUM CITRATE PO SOLN: 1.0000 | Freq: Once | ORAL | Status: DC | PRN

## 2019-07-28 MED ORDER — POLYETHYLENE GLYCOL 3350 17 G PO PACK
17.0000 g | PACK | Freq: Every day | ORAL | Status: DC
  Administered 2019-07-28 – 2019-07-31 (×2): 17 g via ORAL
  Filled 2019-07-28 (×3): qty 1

## 2019-07-28 MED ORDER — TAMSULOSIN HCL 0.4 MG PO CAPS
0.8000 mg | ORAL_CAPSULE | Freq: Every day | ORAL | Status: DC
  Administered 2019-07-28 – 2019-07-30 (×3): 0.8 mg via ORAL
  Filled 2019-07-28 (×3): qty 2

## 2019-07-28 MED ORDER — FOLIC ACID 1 MG PO TABS
1.0000 mg | ORAL_TABLET | Freq: Every day | ORAL | Status: DC
  Administered 2019-07-28 – 2019-07-31 (×4): 1 mg via ORAL
  Filled 2019-07-28 (×4): qty 1

## 2019-07-28 MED ORDER — ATORVASTATIN CALCIUM 40 MG PO TABS
40.0000 mg | ORAL_TABLET | Freq: Every day | ORAL | Status: DC
  Administered 2019-07-28 – 2019-07-30 (×3): 40 mg via ORAL
  Filled 2019-07-28 (×3): qty 1

## 2019-07-28 MED ORDER — ONDANSETRON HCL 4 MG/2ML IJ SOLN: 4.0000 mg | Freq: Four times a day (QID) | INTRAMUSCULAR | Status: DC | PRN

## 2019-07-28 MED ORDER — SODIUM CHLORIDE 0.9 % IV SOLN
2.0000 g | INTRAVENOUS | Status: DC
  Filled 2019-07-28: qty 20

## 2019-07-28 MED ORDER — METRONIDAZOLE IN NACL 5-0.79 MG/ML-% IV SOLN
500.0000 mg | Freq: Three times a day (TID) | INTRAVENOUS | Status: DC
  Filled 2019-07-28: qty 100

## 2019-07-28 MED ORDER — HEPARIN SODIUM (PORCINE) 5000 UNIT/ML IJ SOLN
5000.0000 [IU] | Freq: Two times a day (BID) | INTRAMUSCULAR | Status: DC
  Administered 2019-07-28 – 2019-07-31 (×7): 5000 [IU] via SUBCUTANEOUS
  Filled 2019-07-28 (×6): qty 1

## 2019-07-28 MED ORDER — SENNOSIDES-DOCUSATE SODIUM 8.6-50 MG PO TABS
2.0000 | ORAL_TABLET | Freq: Every day | ORAL | Status: DC
  Administered 2019-07-28 – 2019-07-31 (×3): 2 via ORAL
  Filled 2019-07-28 (×3): qty 2

## 2019-07-28 MED ORDER — RESOURCE THICKENUP CLEAR PO POWD
ORAL | Status: DC | PRN
  Filled 2019-07-28 (×2): qty 125

## 2019-07-28 MED ORDER — ASPIRIN EC 81 MG PO TBEC
81.0000 mg | DELAYED_RELEASE_TABLET | Freq: Every day | ORAL | Status: DC
  Administered 2019-07-28 – 2019-07-31 (×4): 81 mg via ORAL
  Filled 2019-07-28 (×4): qty 1

## 2019-07-28 MED ORDER — HYDROCORTISONE (PERIANAL) 1 % EX CREA
1.0000 "application " | TOPICAL_CREAM | Freq: Two times a day (BID) | CUTANEOUS | Status: DC
  Administered 2019-07-28 (×2): 1 via CUTANEOUS
  Filled 2019-07-28 (×3): qty 1

## 2019-07-28 MED ORDER — SORBITOL 70 % SOLN
960.0000 mL | TOPICAL_OIL | Freq: Once | ORAL | Status: AC
  Administered 2019-07-28: 960 mL via RECTAL
  Filled 2019-07-28: qty 473

## 2019-07-28 MED ORDER — CARVEDILOL 6.25 MG PO TABS
6.2500 mg | ORAL_TABLET | Freq: Two times a day (BID) | ORAL | Status: DC
  Administered 2019-07-28 – 2019-07-31 (×6): 6.25 mg via ORAL
  Filled 2019-07-28 (×6): qty 1

## 2019-07-28 MED ORDER — INSULIN ASPART 100 UNIT/ML ~~LOC~~ SOLN
0.0000 [IU] | Freq: Three times a day (TID) | SUBCUTANEOUS | Status: DC
  Administered 2019-07-29 – 2019-07-30 (×2): 1 [IU] via SUBCUTANEOUS
  Administered 2019-07-30: 5 [IU] via SUBCUTANEOUS
  Administered 2019-07-31: 13:00:00 2 [IU] via SUBCUTANEOUS
  Administered 2019-07-31: 08:00:00 1 [IU] via SUBCUTANEOUS

## 2019-07-28 MED ORDER — ACETAMINOPHEN 325 MG PO TABS
650.0000 mg | ORAL_TABLET | Freq: Four times a day (QID) | ORAL | Status: DC | PRN
  Administered 2019-07-28 – 2019-07-30 (×3): 650 mg via ORAL
  Filled 2019-07-28 (×4): qty 2

## 2019-07-28 MED ORDER — MAGNESIUM CITRATE PO SOLN
1.0000 | Freq: Once | ORAL | Status: AC
  Administered 2019-07-28: 1 via ORAL
  Filled 2019-07-28: qty 296

## 2019-07-28 MED ORDER — CHLORHEXIDINE GLUCONATE CLOTH 2 % EX PADS
6.0000 | MEDICATED_PAD | Freq: Every day | CUTANEOUS | Status: DC
  Administered 2019-07-28 – 2019-07-31 (×4): 6 via TOPICAL

## 2019-07-28 MED ORDER — INSULIN GLARGINE 100 UNIT/ML ~~LOC~~ SOLN
5.0000 [IU] | Freq: Every day | SUBCUTANEOUS | Status: DC
  Administered 2019-07-28: 5 [IU] via SUBCUTANEOUS
  Filled 2019-07-28 (×2): qty 0.05

## 2019-07-28 MED ORDER — TAMSULOSIN HCL 0.4 MG PO CAPS
0.4000 mg | ORAL_CAPSULE | ORAL | Status: AC
  Administered 2019-07-28: 0.4 mg via ORAL
  Filled 2019-07-28: qty 1

## 2019-07-28 MED ORDER — LIDOCAINE HCL URETHRAL/MUCOSAL 2 % EX GEL
1.0000 "application " | CUTANEOUS | Status: DC | PRN
  Administered 2019-07-28 – 2019-07-29 (×2): 1 via TOPICAL
  Filled 2019-07-28 (×3): qty 30

## 2019-07-28 MED ORDER — PANTOPRAZOLE SODIUM 40 MG PO TBEC
40.0000 mg | DELAYED_RELEASE_TABLET | Freq: Every day | ORAL | Status: DC
  Administered 2019-07-28 – 2019-07-31 (×4): 40 mg via ORAL
  Filled 2019-07-28 (×4): qty 1

## 2019-07-28 MED ORDER — SODIUM CHLORIDE 0.9% FLUSH
3.0000 mL | Freq: Two times a day (BID) | INTRAVENOUS | Status: DC
  Administered 2019-07-28 – 2019-07-31 (×7): 3 mL via INTRAVENOUS

## 2019-07-28 MED ORDER — ALUM & MAG HYDROXIDE-SIMETH 200-200-20 MG/5ML PO SUSP: 15.0000 mL | Freq: Four times a day (QID) | ORAL | Status: DC | PRN

## 2019-07-28 MED ORDER — ONDANSETRON HCL 4 MG PO TABS: 4.0000 mg | ORAL_TABLET | Freq: Four times a day (QID) | ORAL | Status: DC | PRN

## 2019-07-28 MED ORDER — ALBUTEROL SULFATE (2.5 MG/3ML) 0.083% IN NEBU: 3.0000 mL | INHALATION_SOLUTION | Freq: Four times a day (QID) | RESPIRATORY_TRACT | Status: DC | PRN

## 2019-07-28 MED ORDER — INSULIN GLARGINE 100 UNIT/ML ~~LOC~~ SOLN: 5.0000 [IU] | Freq: Every day | SUBCUTANEOUS | Status: DC

## 2019-07-28 NOTE — ED Notes (Signed)
ED TO INPATIENT HANDOFF REPORT  Name/Age/Gender [name] 85 y.o. male  Code Status Code Status History    Date Active Date Inactive Code Status Order ID Comments User Context   05/20/2019 2119 05/27/2019 1907 Full Code [id number]  Lars Mage, MD ED   04/22/2017 0155 04/25/2017 2154 Full Code [id number]  Ledell Noss, MD Inpatient   01/27/2016 1725 01/28/2016 1754 Full Code [id number]  Francesca Oman, DO Inpatient   01/07/2016 1749 01/10/2016 0950 Full Code [id number]  Collene Gobble, MD Inpatient   11/11/2015 0236 11/15/2015 2024 Full Code [id number]  Juluis Mire, MD ED   01/16/2015 2046 01/17/2015 1642 Full Code [id number]  Juliet Rude, MD Inpatient   08/28/2014 2045 08/29/2014 1800 Full Code [id number]  Jones Bales, MD Inpatient   Advance Care Planning Activity      Home/SNF/Other Nursing Home  Chief Complaint Rectum Pain   Level of Care/Admitting Diagnosis ED Disposition    ED Disposition Condition [place]: [facility] [[id number]]  Level of Care: Med-Surg [16]  Covid Evaluation: Asymptomatic Screening Protocol (No Symptoms)  Diagnosis: Hydronephrosis [591.ICD-9-CM]  Admitting Physician: Shela Leff [[id number]]  Attending Physician: Shela Leff [[id number]]  PT Class (Do Not Modify): Observation [104]  PT Acc Code (Do Not Modify): Observation [10022]       Medical History Past Medical History:  Diagnosis Date  . Adenomatous colon polyp 02/14/2012  . AICD (automatic cardioverter/defibrillator) present    Medtronic   . BBB (bundle branch block)    right  . Carotid stenosis    a. s/p L carotid stent 2004;  b. Carotid US (09/2014): Bilateral ICA 1-39%, left ECA >59%, normal subclavian bilaterally, occluded left vertebral >> FU 2 years  . Chronic kidney disease   . Chronic systolic CHF (congestive heart failure) (HCC)    a. ischemic CM EF 15-20%;  b. s/p AICD 05/24/04;  c. Echo 7/06: EF 30-40%, mild reduced RVSF, d. Echo  12/2015 EF 35-40%  . Elevated PSA   . History of kidney stones    x 1  . HTN (hypertension)   . Hyperlipidemia   . ICD (implantable cardiac defibrillator) in place 12-25-2012   MDT CRTD upgrade by Dr Lovena Le  . Myocardial infarction ([place]) 1990  . Pericardial effusion    Echocardiogram (09/2014): EF 25% with distal anterior, distal inferior, distal lateral and apical akinesis, grade 1 diastolic dysfunction, very mild aortic stenosis (mean 7 mmHg) - this may be depressed due to low EF (2-D images suggest mild to moderate aortic stenosis), large pericardial effusion, no RA collapse  . Pneumonia   . Presence of permanent cardiac pacemaker    Medtronic  . PVD (peripheral vascular disease) ([place])    s/p L carotid PTCA/stent 2004  . Transient ischemic attack   . Type II diabetes mellitus ([place])    type II    Allergies No Known Allergies  IV Location/Drains/Wounds Patient Lines/Drains/Airways Status   Active Line/Drains/Airways    Name:   Placement date:   Placement time:   Site:   Days:   Peripheral IV 07/28/19 Right Forearm   07/28/19    0258    Forearm   less than 1   Urethral Catheter Evan Cheek Straight-tip 16 Fr.   05/27/19    0645    Straight-tip   62   Urethral Catheter Lehman Prom, K EMT Straight-tip;Latex 16 Fr.   07/28/19    0245    Straight-tip;Latex  less than 1   Pressure Ulcer 11/17/15 Stage II -  Partial thickness loss of dermis presenting as a shallow open ulcer with a red, pink wound bed without slough. Covered with Foam Mepilex   11/17/15    1110     1349   Pressure Injury 04/22/17 Stage II -  Partial thickness loss of dermis presenting as a shallow open ulcer with a red, pink wound bed without slough. this is a partial thickness fissure in the gluteal cleft related to moisture   04/22/17    0302     827          Labs/Imaging Results for orders placed or performed during the hospital encounter of 07/27/19 (from the past 48 hour(s))  CBC with Differential/Platelet      Status: None   Collection Time: 07/28/19 12:59 AM  Result Value Ref Range   WBC 7.1 4.0 - 10.5 K/uL   RBC 4.61 4.22 - 5.81 MIL/uL   Hemoglobin 13.5 13.0 - 17.0 g/dL   HCT 43.9 39.0 - 52.0 %   MCV 95.2 80.0 - 100.0 fL   MCH 29.3 26.0 - 34.0 pg   MCHC 30.8 30.0 - 36.0 g/dL   RDW 15.0 11.5 - 15.5 %   Platelets 249 150 - 400 K/uL   nRBC 0.0 0.0 - 0.2 %   Neutrophils Relative % 69 %   Neutro Abs 4.9 1.7 - 7.7 K/uL   Lymphocytes Relative 19 %   Lymphs Abs 1.3 0.7 - 4.0 K/uL   Monocytes Relative 10 %   Monocytes Absolute 0.7 0.1 - 1.0 K/uL   Eosinophils Relative 1 %   Eosinophils Absolute 0.1 0.0 - 0.5 K/uL   Basophils Relative 1 %   Basophils Absolute 0.1 0.0 - 0.1 K/uL   Immature Granulocytes 0 %   Abs Immature Granulocytes 0.02 0.00 - 0.07 K/uL    Comment: Performed at [facility], [place] [address], [place], [place] [address]  I-stat chem 8, ED (not at [facility] or [facility])     Status: Abnormal   Collection Time: 07/28/19  1:15 AM  Result Value Ref Range   Sodium 140 135 - 145 mmol/L   Potassium 4.9 3.5 - 5.1 mmol/L   Chloride 105 98 - 111 mmol/L   BUN 42 (H) 8 - 23 mg/dL   Creatinine, Ser 2.20 (H) 0.61 - 1.24 mg/dL   Glucose, Bld 133 (H) 70 - 99 mg/dL   Calcium, Ion 1.30 1.15 - 1.40 mmol/L   TCO2 27 22 - 32 mmol/L   Hemoglobin 15.0 13.0 - 17.0 g/dL   HCT 44.0 39.0 - 52.0 %  Urinalysis, Routine w reflex microscopic     Status: Abnormal   Collection Time: 07/28/19  2:29 AM  Result Value Ref Range   Color, Urine YELLOW YELLOW   APPearance CLEAR CLEAR   Specific Gravity, Urine 1.012 1.005 - 1.030   pH 5.0 5.0 - 8.0   Glucose, UA NEGATIVE NEGATIVE mg/dL   Hgb urine dipstick NEGATIVE NEGATIVE   Bilirubin Urine NEGATIVE NEGATIVE   Ketones, ur 5 (A) NEGATIVE mg/dL   Protein, ur NEGATIVE NEGATIVE mg/dL   Nitrite NEGATIVE NEGATIVE   Leukocytes,Ua NEGATIVE NEGATIVE    Comment: Performed at [facility] [address] [place], [place] [address]   Lactic acid, plasma     Status: None   Collection Time: 07/28/19  2:29 AM  Result Value Ref Range   Lactic Acid, Venous 1.1 0.5 -  1.9 mmol/L    Comment: Performed at [facility], [place] [address], [place], [place] [address]   Dg Abd Acute 2+v W 1v Chest  Result Date: 07/28/2019 CLINICAL DATA:  Constipation EXAM: DG ABDOMEN ACUTE W/ 1V CHEST COMPARISON:  CT 04/23/2017 FINDINGS: Left AICD in place.  Mild cardiomegaly.  Lungs clear.  No effusions. Mild diffuse gaseous distention of bowel could reflect ileus. No free air. No organomegaly or suspicious calcification. Degenerative changes in the lumbar spine and hips. No acute bony abnormality. IMPRESSION: Mild diffuse gaseous distention of bowel may reflect ileus. No acute cardiopulmonary disease. Electronically Signed   By: Rolm Baptise M.D.   On: 07/28/2019 00:11   Ct Renal Stone Study  Result Date: 07/28/2019 CLINICAL DATA:  Rectal pain EXAM: CT ABDOMEN AND PELVIS WITHOUT CONTRAST TECHNIQUE: Multidetector CT imaging of the abdomen and pelvis was performed following the standard protocol without IV contrast. COMPARISON:  April 23, 2017 FINDINGS: Lower chest: The lung bases are clear.The heart size is normal. There is a small pericardial effusion. Hepatobiliary: The liver is normal. Cholelithiasis without acute inflammation.There is no biliary ductal dilation. Pancreas: Normal contours without ductal dilatation. No peripancreatic fluid collection. Spleen: No splenic laceration or hematoma. Adrenals/Urinary Tract: --Adrenal glands: No adrenal hemorrhage. --Right kidney/ureter: There is mild right-sided hydroureteronephrosis. The right kidney is somewhat atrophic in comparison to the left. --Left kidney/ureter: There is mild left-sided hydroureteronephrosis to the level of the urinary bladder. --Urinary bladder: The urinary bladder demonstrates significant distension and mild bladder wall thickening. Stomach/Bowel: --Stomach/Duodenum: No  hiatal hernia or other gastric abnormality. Normal duodenal course and caliber. --Small bowel: No dilatation or inflammation. --Colon: There is a large amount of stool throughout the colon. There is a large amount of stool in the rectum with rectal wall thickening and mild adjacent fat stranding. This appearance is similar to prior study. --Appendix: Normal. Vascular/Lymphatic: Atherosclerotic calcification is present within the non-aneurysmal abdominal aorta, without hemodynamically significant stenosis. --No retroperitoneal lymphadenopathy. --No mesenteric lymphadenopathy. --No pelvic or inguinal lymphadenopathy. Reproductive: There is severe prostatomegaly. Other: No ascites or free air. The abdominal wall is normal. Musculoskeletal. No acute displaced fractures. IMPRESSION: 1. Mild bilateral hydroureteronephrosis that appears to be secondary to a distended urinary bladder. There is severe prostatomegaly likely causing bladder outlet obstruction. 2. Large amount of stool throughout the colon. There is rectal wall thickening which may be secondary to proctitis or stercoral colitis. This is similar to prior study in 2018. 3.  Aortic Atherosclerosis (ICD10-I70.0). Electronically Signed   By: Constance Holster M.D.   On: 07/28/2019 02:08    Pending Labs Unresulted Labs (From admission, onward)    Start     Ordered   07/28/19 0216  SARS CORONAVIRUS 2 (TAT 6-24 HRS) Nasopharyngeal Nasopharyngeal Swab  (Asymptomatic/Tier 2 Patients Labs)  Once,   STAT    Question Answer Comment  Is this test for diagnosis or screening Screening   Symptomatic for COVID-19 as defined by CDC No   Hospitalized for COVID-19 No   Admitted to ICU for COVID-19 No   Previously tested for COVID-19 Yes   Resident in a congregate (group) care setting No   Employed in healthcare setting No      07/28/19 0215   07/28/19 0214  Lactic acid, plasma  Now then every 2 hours,   STAT     07/28/19 0213          Vitals/Pain Today's  Vitals   07/27/19 1956 07/28/19 0249 07/28/19 0249 07/28/19  0339  BP: (!) 164/78 (!) 164/81 (!) 164/81 99/60  Pulse: 88 80 80 71  Resp: 17  19 14   Temp:   97.6 F (36.4 C)   TempSrc:   Oral   SpO2: 100% 100% 100% 100%  PainSc:        Isolation Precautions No active isolations  Medications Medications  acetaminophen (TYLENOL) tablet 1,000 mg (1,000 mg Oral Given 07/28/19 0045)  tamsulosin (FLOMAX) capsule 0.4 mg (0.4 mg Oral Given 07/28/19 0204)  piperacillin-tazobactam (ZOSYN) IVPB 3.375 g (0 g Intravenous Stopped 07/28/19 0336)    Mobility walks with device

## 2019-07-28 NOTE — ED Provider Notes (Signed)
[place] DEPT Provider Note   CSN: [id number] Arrival date & time: 07/27/19  1644     History   Chief Complaint Chief Complaint  Patient presents with   Rectal Pain    HPI EZRIEL BOFFA is a 85 y.o. male.     The history is provided by the patient.  Illness Location:  Rectum Quality:  Painful Severity:  Severe Onset quality:  Gradual Duration:  6 days Timing:  Constant Progression:  Worsening Chronicity:  New Context:  Elderly with enlarged prostate and cardiac disease Relieved by:  Nothing Worsened by:  Time Ineffective treatments:  None tried Associated symptoms: no abdominal pain, no chest pain, no congestion, no cough, no diarrhea, no fever, no loss of consciousness, no shortness of breath and no wheezing     Past Medical History:  Diagnosis Date   Adenomatous colon polyp 02/14/2012   AICD (automatic cardioverter/defibrillator) present    Medtronic    BBB (bundle branch block)    right   Carotid stenosis    a. s/p L carotid stent 2004;  b. Carotid US (09/2014): Bilateral ICA 1-39%, left ECA >59%, normal subclavian bilaterally, occluded left vertebral >> FU 2 years   Chronic kidney disease    Chronic systolic CHF (congestive heart failure) ([place])    a. ischemic CM EF 15-20%;  b. s/p AICD 05/24/04;  c. Echo 7/06: EF 30-40%, mild reduced RVSF, d. Echo 12/2015 EF 35-40%   Elevated PSA    History of kidney stones    x 1   HTN (hypertension)    Hyperlipidemia    ICD (implantable cardiac defibrillator) in place 12-25-2012   MDT CRTD upgrade by Dr Lovena Le   Myocardial infarction [facility]) 1990   Pericardial effusion    Echocardiogram (09/2014): EF 25% with distal anterior, distal inferior, distal lateral and apical akinesis, grade 1 diastolic dysfunction, very mild aortic stenosis (mean 7 mmHg) - this may be depressed due to low EF (2-D images suggest mild to moderate aortic stenosis), large pericardial effusion, no RA  collapse   Pneumonia    Presence of permanent cardiac pacemaker    Medtronic   PVD (peripheral vascular disease) ([place])    s/p L carotid PTCA/stent 2004   Transient ischemic attack    Type II diabetes mellitus ([place])    type II    Patient Active Problem List   Diagnosis Date Noted   Protein-calorie malnutrition, severe 05/21/2019   Acute encephalopathy 05/20/2019   Hypertensive retinopathy of both eyes 02/05/2018   Mild nonproliferative diabetic retinopathy of both eyes without macular edema associated with type 2 diabetes mellitus ([place]) 02/05/2018   Sensorineural hearing loss (SNHL), bilateral 12/11/2017   Dry eye syndrome of both eyes 06/06/2017   Weakness generalized    Weight loss 12/11/2016   Advanced care planning/counseling discussion 12/11/2016   Hypoglycemia 01/27/2016   Urinary retention 01/13/2016   Healthcare maintenance 11/22/2015   [name] (chronic kidney disease) stage 3, GFR 30-59 ml/min (HCC) 12/04/2014   Automatic implantable cardioverter-defibrillator in situ 01/16/2012   Constipation 03/10/2009   Coronary atherosclerosis 09/73/5329   Chronic systolic congestive heart failure ([place]) 12/25/2007   Hyperlipidemia 01/02/2007   Type 2 diabetes mellitus with peripheral vascular disease ([place]) 01/01/1989    Past Surgical History:  Procedure Laterality Date   BI-VENTRICULAR IMPLANTABLE CARDIOVERTER DEFIBRILLATOR UPGRADE N/A 12/25/2012   Procedure: BI-VENTRICULAR IMPLANTABLE CARDIOVERTER DEFIBRILLATOR UPGRADE;  Surgeon: Evans Lance, MD;  Location: [facility] CATH LAB;  Service: Cardiovascular;  Laterality: N/A;  BIV ICD UPGRADE  12/25/2012   MDT CRTD upgrade by Dr Lovena Le for ischemic cardiomyopathy and worsening conduction system disease   CARDIAC CATHETERIZATION  06/2003,  01/2004   CARDIAC CATHETERIZATION N/A 01/13/2016   Procedure: Left Heart Cath and Coronary Angiography;  Surgeon: Jettie Booze, MD;  Location: [place] CV LAB;  Service:  Cardiovascular;  Laterality: N/A;   CARDIAC DEFIBRILLATOR PLACEMENT  05/24/2004   Implantation of a MDT single-chamber defibrillator   CAROTID STENT  09/11/2003   Percutaneous transluminal angioplasty and stent placement of the left internal carotid artery.   CATARACT EXTRACTION W/ INTRAOCULAR LENS  IMPLANT, BILATERAL Bilateral ~ 2010   [place]   "2" (12/25/2012)   CYSTOSCOPY     INSERT / REPLACE / REMOVE PACEMAKER     LEAD REVISION N/A 12/25/2012   Procedure: LEAD REVISION;  Surgeon: Evans Lance, MD;  Location: [facility] CATH LAB;  Service: Cardiovascular;  Laterality: N/A;        Home Medications    Prior to Admission medications   Medication Sig Start Date End Date Taking? Authorizing Provider  alum & mag hydroxide-simeth (MAALOX/MYLANTA) 200-200-20 MG/5ML suspension Take 15 mLs by mouth every 6 (six) hours as needed for indigestion or heartburn. 03/27/19  Yes Wieters, Hallie C, PA-C  aspirin EC 81 MG tablet Take 81 mg by mouth daily.   Yes [provider]  atorvastatin (LIPITOR) 40 MG tablet TAKE 1 TABLET(40 MG) BY MOUTH DAILY AT 6 PM Patient taking differently: Take 40 mg by mouth daily at 6 PM.  03/03/19  Yes Axel Filler, MD  Blood Glucose Monitoring Suppl (ACCU-CHEK AVIVA PLUS) w/Device KIT Check finger stick glucose once daily 05/06/18  Yes Axel Filler, MD  carvedilol (COREG) 12.5 MG tablet Take 12.5 mg by mouth daily before breakfast.   Yes [provider]  furosemide (LASIX) 40 MG tablet Take 1 tablet (40 mg total) by mouth 2 (two) times daily. Patient taking differently: Take 20 mg by mouth 2 (two) times daily.  04/14/19  Yes Evans Lance, MD  glucose blood (ACCU-CHEK AVIVA PLUS) test strip Check blood sugar up to 3 times a day 08/06/17  Yes Axel Filler, MD  Hydrocortisone, Perianal, (PROCTO-PAK) 1 % CREA Apply 1 application topically 2 (two) times daily. 07/25/19  Yes Raylene Everts, MD   insulin glargine (LANTUS) 100 UNIT/ML injection Inject 0.07 mLs (7 Units total) into the skin daily. 05/27/19  Yes Madalyn Rob, MD  Insulin Pen Needle (B-D ULTRAFINE III SHORT PEN) 31G X 8 MM MISC USE ONCE DAILY WITH LANTUS PEN 05/06/18  Yes Axel Filler, MD  lidocaine (XYLOCAINE) 2 % jelly Apply 1 application topically as needed. Patient taking differently: Apply 1 application topically as needed (wound).  07/25/19  Yes Raylene Everts, MD  pantoprazole (PROTONIX) 40 MG tablet TAKE 1 TABLET(40 MG) BY MOUTH DAILY Patient taking differently: Take 40 mg by mouth daily.  03/11/19  Yes Axel Filler, MD  polyethylene glycol [facility] / Floria Raveling) packet Take 17 g by mouth daily. Patient taking differently: Take 17 g by mouth daily as needed for mild constipation.  09/13/17  Yes Jola Schmidt, MD  senna-docusate (SENOKOT-S) 8.6-50 MG tablet Take 2 tablets by mouth daily. Patient taking differently: Take 2 tablets by mouth daily as needed for mild constipation.  05/04/16  Yes Konrad Felix, PA  tamsulosin (FLOMAX) 0.4 MG CAPS capsule Take 0.8 mg by mouth at bedtime.  Yes [provider]  VENTOLIN HFA 108 (90 Base) MCG/ACT inhaler INHALE 2 PUFFS INTO THE LUNGS EVERY 6 HOURS AS NEEDED FOR WHEEZING OR SHORTNESS OF BREATH Patient taking differently: Inhale 2 puffs into the lungs every 6 (six) hours as needed for wheezing or shortness of breath.  03/15/16  Yes Axel Filler, MD    Family History Family History  Problem Relation Age of Onset   Diabetes Mother    Healthy Father    Diabetes Brother    Heart attack Neg Hx    Stroke Neg Hx     Social History Social History   Tobacco Use   Smoking status: Former Smoker    Types: Cigarettes    Quit date: 12/27/1967    Years since quitting: 51.6   Smokeless tobacco: Never Used  Substance Use Topics   Alcohol use: No    Alcohol/week: 0.0 standard drinks    Comment: 12/25/2012 "quit all alcohol 60 yr ago"     Drug use: No     Allergies   Patient has no known allergies.   Review of Systems Review of Systems  Constitutional: Negative for fever.  HENT: Negative for congestion.   Eyes: Negative for visual disturbance.  Respiratory: Negative for cough, shortness of breath and wheezing.   Cardiovascular: Negative for chest pain.  Gastrointestinal: Negative for abdominal pain, anal bleeding and diarrhea.  Genitourinary: Positive for difficulty urinating.  Musculoskeletal: Negative for arthralgias.  Neurological: Negative for dizziness and loss of consciousness.  Psychiatric/Behavioral: Negative for agitation.  All other systems reviewed and are negative.    Physical Exam Updated Vital Signs BP (!) 164/81 (BP Location: Left Arm)    Pulse 80    Temp 97.6 F (36.4 C) (Oral)    Resp 19    SpO2 100%   Physical Exam Vitals signs and nursing note reviewed.  Constitutional:      General: He is not in acute distress.    Appearance: He is normal weight.  HENT:     Head: Normocephalic and atraumatic.     Nose: Nose normal.  Eyes:     Conjunctiva/sclera: Conjunctivae normal.     Pupils: Pupils are equal, round, and reactive to light.  Neck:     Musculoskeletal: Normal range of motion and neck supple.  Cardiovascular:     Rate and Rhythm: Normal rate and regular rhythm.     Pulses: Normal pulses.     Heart sounds: Normal heart sounds.  Pulmonary:     Effort: Pulmonary effort is normal.     Breath sounds: Normal breath sounds.  Abdominal:     General: Bowel sounds are normal. There is distension.     Tenderness: There is no guarding.  Musculoskeletal: Normal range of motion.  Skin:    General: Skin is warm and dry.     Capillary Refill: Capillary refill takes less than 2 seconds.  Neurological:     General: No focal deficit present.     Mental Status: He is alert and oriented to person, place, and time.  Psychiatric:        Mood and Affect: Mood normal.        Behavior:  Behavior normal.      ED Treatments / Results  Labs (all labs ordered are listed, but only abnormal results are displayed) Results for orders placed or performed during the hospital encounter of 07/27/19  CBC with Differential/Platelet  Result Value Ref Range   WBC 7.1 4.0 - 10.5  K/uL   RBC 4.61 4.22 - 5.81 MIL/uL   Hemoglobin 13.5 13.0 - 17.0 g/dL   HCT 43.9 39.0 - 52.0 %   MCV 95.2 80.0 - 100.0 fL   MCH 29.3 26.0 - 34.0 pg   MCHC 30.8 30.0 - 36.0 g/dL   RDW 15.0 11.5 - 15.5 %   Platelets 249 150 - 400 K/uL   nRBC 0.0 0.0 - 0.2 %   Neutrophils Relative % 69 %   Neutro Abs 4.9 1.7 - 7.7 K/uL   Lymphocytes Relative 19 %   Lymphs Abs 1.3 0.7 - 4.0 K/uL   Monocytes Relative 10 %   Monocytes Absolute 0.7 0.1 - 1.0 K/uL   Eosinophils Relative 1 %   Eosinophils Absolute 0.1 0.0 - 0.5 K/uL   Basophils Relative 1 %   Basophils Absolute 0.1 0.0 - 0.1 K/uL   Immature Granulocytes 0 %   Abs Immature Granulocytes 0.02 0.00 - 0.07 K/uL  Urinalysis, Routine w reflex microscopic  Result Value Ref Range   Color, Urine YELLOW YELLOW   APPearance CLEAR CLEAR   Specific Gravity, Urine 1.012 1.005 - 1.030   pH 5.0 5.0 - 8.0   Glucose, UA NEGATIVE NEGATIVE mg/dL   Hgb urine dipstick NEGATIVE NEGATIVE   Bilirubin Urine NEGATIVE NEGATIVE   Ketones, ur 5 (A) NEGATIVE mg/dL   Protein, ur NEGATIVE NEGATIVE mg/dL   Nitrite NEGATIVE NEGATIVE   Leukocytes,Ua NEGATIVE NEGATIVE  Lactic acid, plasma  Result Value Ref Range   Lactic Acid, Venous 1.1 0.5 - 1.9 mmol/L  I-stat chem 8, ED (not at [facility] or [facility])  Result Value Ref Range   Sodium 140 135 - 145 mmol/L   Potassium 4.9 3.5 - 5.1 mmol/L   Chloride 105 98 - 111 mmol/L   BUN 42 (H) 8 - 23 mg/dL   Creatinine, Ser 2.20 (H) 0.61 - 1.24 mg/dL   Glucose, Bld 133 (H) 70 - 99 mg/dL   Calcium, Ion 1.30 1.15 - 1.40 mmol/L   TCO2 27 22 - 32 mmol/L   Hemoglobin 15.0 13.0 - 17.0 g/dL   HCT 44.0 39.0 - 52.0 %   Dg Abd Acute 2+v W 1v Chest  Result  Date: 07/28/2019 CLINICAL DATA:  Constipation EXAM: DG ABDOMEN ACUTE W/ 1V CHEST COMPARISON:  CT 04/23/2017 FINDINGS: Left AICD in place.  Mild cardiomegaly.  Lungs clear.  No effusions. Mild diffuse gaseous distention of bowel could reflect ileus. No free air. No organomegaly or suspicious calcification. Degenerative changes in the lumbar spine and hips. No acute bony abnormality. IMPRESSION: Mild diffuse gaseous distention of bowel may reflect ileus. No acute cardiopulmonary disease. Electronically Signed   By: Rolm Baptise M.D.   On: 07/28/2019 00:11   Ct Renal Stone Study  Result Date: 07/28/2019 CLINICAL DATA:  Rectal pain EXAM: CT ABDOMEN AND PELVIS WITHOUT CONTRAST TECHNIQUE: Multidetector CT imaging of the abdomen and pelvis was performed following the standard protocol without IV contrast. COMPARISON:  April 23, 2017 FINDINGS: Lower chest: The lung bases are clear.The heart size is normal. There is a small pericardial effusion. Hepatobiliary: The liver is normal. Cholelithiasis without acute inflammation.There is no biliary ductal dilation. Pancreas: Normal contours without ductal dilatation. No peripancreatic fluid collection. Spleen: No splenic laceration or hematoma. Adrenals/Urinary Tract: --Adrenal glands: No adrenal hemorrhage. --Right kidney/ureter: There is mild right-sided hydroureteronephrosis. The right kidney is somewhat atrophic in comparison to the left. --Left kidney/ureter: There is mild left-sided hydroureteronephrosis to the level of the urinary bladder. --  Urinary bladder: The urinary bladder demonstrates significant distension and mild bladder wall thickening. Stomach/Bowel: --Stomach/Duodenum: No hiatal hernia or other gastric abnormality. Normal duodenal course and caliber. --Small bowel: No dilatation or inflammation. --Colon: There is a large amount of stool throughout the colon. There is a large amount of stool in the rectum with rectal wall thickening and mild adjacent fat  stranding. This appearance is similar to prior study. --Appendix: Normal. Vascular/Lymphatic: Atherosclerotic calcification is present within the non-aneurysmal abdominal aorta, without hemodynamically significant stenosis. --No retroperitoneal lymphadenopathy. --No mesenteric lymphadenopathy. --No pelvic or inguinal lymphadenopathy. Reproductive: There is severe prostatomegaly. Other: No ascites or free air. The abdominal wall is normal. Musculoskeletal. No acute displaced fractures. IMPRESSION: 1. Mild bilateral hydroureteronephrosis that appears to be secondary to a distended urinary bladder. There is severe prostatomegaly likely causing bladder outlet obstruction. 2. Large amount of stool throughout the colon. There is rectal wall thickening which may be secondary to proctitis or stercoral colitis. This is similar to prior study in 2018. 3.  Aortic Atherosclerosis (ICD10-I70.0). Electronically Signed   By: Constance Holster M.D.   On: 07/28/2019 02:08    EKG None  Radiology Dg Abd Acute 2+v W 1v Chest  Result Date: 07/28/2019 CLINICAL DATA:  Constipation EXAM: DG ABDOMEN ACUTE W/ 1V CHEST COMPARISON:  CT 04/23/2017 FINDINGS: Left AICD in place.  Mild cardiomegaly.  Lungs clear.  No effusions. Mild diffuse gaseous distention of bowel could reflect ileus. No free air. No organomegaly or suspicious calcification. Degenerative changes in the lumbar spine and hips. No acute bony abnormality. IMPRESSION: Mild diffuse gaseous distention of bowel may reflect ileus. No acute cardiopulmonary disease. Electronically Signed   By: Rolm Baptise M.D.   On: 07/28/2019 00:11   Ct Renal Stone Study  Result Date: 07/28/2019 CLINICAL DATA:  Rectal pain EXAM: CT ABDOMEN AND PELVIS WITHOUT CONTRAST TECHNIQUE: Multidetector CT imaging of the abdomen and pelvis was performed following the standard protocol without IV contrast. COMPARISON:  April 23, 2017 FINDINGS: Lower chest: The lung bases are clear.The heart size is  normal. There is a small pericardial effusion. Hepatobiliary: The liver is normal. Cholelithiasis without acute inflammation.There is no biliary ductal dilation. Pancreas: Normal contours without ductal dilatation. No peripancreatic fluid collection. Spleen: No splenic laceration or hematoma. Adrenals/Urinary Tract: --Adrenal glands: No adrenal hemorrhage. --Right kidney/ureter: There is mild right-sided hydroureteronephrosis. The right kidney is somewhat atrophic in comparison to the left. --Left kidney/ureter: There is mild left-sided hydroureteronephrosis to the level of the urinary bladder. --Urinary bladder: The urinary bladder demonstrates significant distension and mild bladder wall thickening. Stomach/Bowel: --Stomach/Duodenum: No hiatal hernia or other gastric abnormality. Normal duodenal course and caliber. --Small bowel: No dilatation or inflammation. --Colon: There is a large amount of stool throughout the colon. There is a large amount of stool in the rectum with rectal wall thickening and mild adjacent fat stranding. This appearance is similar to prior study. --Appendix: Normal. Vascular/Lymphatic: Atherosclerotic calcification is present within the non-aneurysmal abdominal aorta, without hemodynamically significant stenosis. --No retroperitoneal lymphadenopathy. --No mesenteric lymphadenopathy. --No pelvic or inguinal lymphadenopathy. Reproductive: There is severe prostatomegaly. Other: No ascites or free air. The abdominal wall is normal. Musculoskeletal. No acute displaced fractures. IMPRESSION: 1. Mild bilateral hydroureteronephrosis that appears to be secondary to a distended urinary bladder. There is severe prostatomegaly likely causing bladder outlet obstruction. 2. Large amount of stool throughout the colon. There is rectal wall thickening which may be secondary to proctitis or stercoral colitis. This is similar to prior study in  2018. 3.  Aortic Atherosclerosis (ICD10-I70.0). Electronically  Signed   By: Constance Holster M.D.   On: 07/28/2019 02:08    Procedures Procedures (including critical care time)  Medications Ordered in ED Medications  acetaminophen (TYLENOL) tablet 1,000 mg (1,000 mg Oral Given 07/28/19 0045)  tamsulosin (FLOMAX) capsule 0.4 mg (0.4 mg Oral Given 07/28/19 0204)  piperacillin-tazobactam (ZOSYN) IVPB 3.375 g (3.375 g Intravenous New Bag/Given 07/28/19 0259)     Will admit for stercoral colitis and ileus/ARF and hydro secondary to bladder outlet instruction   Final Clinical Impressions(s) / ED Diagnoses   Final diagnoses:  Constipation, unspecified constipation type  Enlarged prostate  Acute urinary retention  Acute kidney injury [facility])    Admit to medicine   Matson Welch, MD 07/28/19 7588

## 2019-07-28 NOTE — Telephone Encounter (Signed)
[name] with [facility] requesting to speak with a nurse. Please call back.

## 2019-07-28 NOTE — Evaluation (Signed)
Physical Therapy Evaluation Patient Details Name: [name] MRN: [id number] DOB: 04/23/1931 Today's Date: 07/28/2019   History of Present Illness  85 yo male admitted with rectal pain, AKI. Hx of CAD, CHF, DM, retinopathy, [name], BBB, carotid stenosis, BPH, ICD, PVD, TIA, ongoing rectal pain  Clinical Impression  On eval, pt Supv for rolling in bed. He declined sitting EOB/OOB activity 2* pain. Made RN aware of pt's request for better pain control. Will follow and progress activity as tolerated. Will continue to assess/update d/c needs/recommendations.     Follow Up Recommendations Home health PT;SNF(depending on progress)    Equipment Recommendations  None recommended by PT    Recommendations for Other Services       Precautions / Restrictions Precautions Precautions: Fall Restrictions Weight Bearing Restrictions: No      Mobility  Bed Mobility Overal bed mobility: Needs Assistance Bed Mobility: Rolling Rolling: Supervision            Transfers                 General transfer comment: NT-pt declined OOB 2* pain  Ambulation/Gait                Stairs            Wheelchair Mobility    Modified Rankin (Stroke Patients Only)       Balance                                             Pertinent Vitals/Pain Pain Assessment: 0-10 Pain Score: 9  Pain Location: rectum Pain Intervention(s): Patient requesting pain meds-RN notified    Home Living Family/patient expects to be discharged to:: Private residence Living Arrangements: Alone Available Help at Discharge: Available PRN/intermittently Type of Home: Apartment Home Access: Ramped entrance     Home Layout: One level Home Equipment: Walker - 4 wheels;Cane - quad      Prior Function Level of Independence: Independent with assistive device(s)         Comments: pt stated he uses his large base quad cane for ambulation. ADLs/meals without assistance      Hand Dominance        Extremity/Trunk Assessment   Upper Extremity Assessment Upper Extremity Assessment: Overall WFL for tasks assessed    Lower Extremity Assessment Lower Extremity Assessment: Generalized weakness    Cervical / Trunk Assessment Cervical / Trunk Assessment: Normal  Communication   Communication: HOH  Cognition Arousal/Alertness: Awake/alert Behavior During Therapy: WFL for tasks assessed/performed Overall Cognitive Status: Within Functional Limits for tasks assessed                                 General Comments: [facility]eras [facility]      General Comments      Exercises     Assessment/Plan    PT Assessment Patient needs continued PT services  PT Problem List Decreased mobility;Pain;Decreased balance;Decreased knowledge of use of DME;Decreased activity tolerance       PT Treatment Interventions DME instruction;Gait training;Therapeutic exercise;Therapeutic activities;Patient/family education;Functional mobility training;Balance training    PT Goals (Current goals can be found in the Care Plan section)  Acute Rehab PT Goals Patient Stated Goal: improved pain control PT Goal Formulation: With patient Time For Goal Achievement: 08/11/19 Potential to Achieve Goals: Good  Frequency Min 3X/week   Barriers to discharge        Co-evaluation               AM-PAC PT "6 Clicks" Mobility  Outcome Measure Help needed turning from your back to your side while in a flat bed without using bedrails?: A Little Help needed moving from lying on your back to sitting on the side of a flat bed without using bedrails?: A Little Help needed moving to and from a bed to a chair (including a wheelchair)?: A Little Help needed standing up from a chair using your arms (e.g., wheelchair or bedside chair)?: A Little Help needed to walk in hospital room?: A Little Help needed climbing 3-5 steps with a railing? : A Lot 6 Click Score: 17    End  of Session   Activity Tolerance: Patient limited by pain Patient left: in bed;with call bell/phone within reach;with bed alarm set   PT Visit Diagnosis: Pain;Other abnormalities of gait and mobility (R26.89) Pain - part of body: (rectum)    Time: 1427-6701 PT Time Calculation (min) (ACUTE ONLY): 12 min   Charges:   PT Evaluation $PT Eval Moderate Complexity: [place], PT Acute Rehabilitation Services Pager: [phone] Office: [phone]

## 2019-07-28 NOTE — Telephone Encounter (Signed)
Pt is inpt at [facility] starting 11/1

## 2019-07-28 NOTE — H&P (Addendum)
History and Physical    [name]:[id number] DOB: Apr 16, 1931 DOA: 07/27/2019  PCP: Axel Filler, MD   Patient coming from: Home  I have personally briefly reviewed patient's old medical records in [place]  Chief Complaint: Rectal Pain  HPI: [name] is a 85 y.o. male with medical history significant of CAD/ICMP, CHF s/p AICD,  Carotid disease s/p stent, Hx of nephrolithiasis, HTN, HLD, T2DM/IDDM c/b retinopathy and nephropathy, [name], and TIA who presents with ongoing rectal pain.  Patient reports he was at his most recent baseline state of health on Friday when he began to have worsening rectal pain.  He states he has not had a bowel movement since Friday.  He did visit his PCP on Friday and noted to have fecal incontinence and prescribed topical agents to assist with pain/irritation.  He states he has been taking his medications including his bowel regimen but had not noticed any out-put except for some clear liquid (but denies any stool output).  He was recently admitted to [facility] in September and had a foley catheter placed and was discharged to a SNF with an indwelling foley catheter after a failed voiding trial.  He has a long standing hx of BPH and chronic urinary retention and did have the catheter removed in Urology clinic after discharge.  He states he was voiding well up until Friday when he began to have the severe rectal pain.  He has not been able to urinate appropriately since then.    Chronic issues, patient has had hoarse voice and ongoing weight loss.  He reports good PO intake and was followed previously by nutrition.  He underwent evaluation with ENT in ambulatory setting and was noted to have abnormal vocal cord, was scheduled for direct laryngoscopy and potential biopsy this past Friday but rescheduled due to current symptoms of pain.  He denies any fevers, chills, chest pain, sob, cough.  He has not had any nausea or vomiting.   Review  of Systems: As per HPI otherwise 10 point review of systems negative.    Past Medical History:  Diagnosis Date   Adenomatous colon polyp 02/14/2012   AICD (automatic cardioverter/defibrillator) present    Medtronic    BBB (bundle branch block)    right   Carotid stenosis    a. s/p L carotid stent 2004;  b. Carotid US (09/2014): Bilateral ICA 1-39%, left ECA >59%, normal subclavian bilaterally, occluded left vertebral >> FU 2 years   Chronic kidney disease    Chronic systolic CHF (congestive heart failure) ([place])    a. ischemic CM EF 15-20%;  b. s/p AICD 05/24/04;  c. Echo 7/06: EF 30-40%, mild reduced RVSF, d. Echo 12/2015 EF 35-40%   Elevated PSA    History of kidney stones    x 1   HTN (hypertension)    Hyperlipidemia    ICD (implantable cardiac defibrillator) in place 12-25-2012   MDT CRTD upgrade by Dr Lovena Le   Myocardial infarction [facility]) 1990   Pericardial effusion    Echocardiogram (09/2014): EF 25% with distal anterior, distal inferior, distal lateral and apical akinesis, grade 1 diastolic dysfunction, very mild aortic stenosis (mean 7 mmHg) - this may be depressed due to low EF (2-D images suggest mild to moderate aortic stenosis), large pericardial effusion, no RA collapse   Pneumonia    Presence of permanent cardiac pacemaker    Medtronic   PVD (peripheral vascular disease) ([place])    s/p L carotid  PTCA/stent 2004   Transient ischemic attack    Type II diabetes mellitus ([place])    type II    Past Surgical History:  Procedure Laterality Date   BI-VENTRICULAR IMPLANTABLE CARDIOVERTER DEFIBRILLATOR UPGRADE N/A 12/25/2012   Procedure: BI-VENTRICULAR IMPLANTABLE CARDIOVERTER DEFIBRILLATOR UPGRADE;  Surgeon: Evans Lance, MD;  Location[facility] C[facility] LAB;  Service: Cardiovascular;  Laterality: N/A;   BIV ICD UPGRADE  12/25/2012   MDT CRTD upgrade by Dr Lovena Le for ischemic cardiomyopathy and worsening conduction system disease   CARDIAC CATHETERIZATION  06/2003,  01/2004     CARDIAC CATHETERIZATION N/A 01/13/2016   Procedure: Left Heart Cath and Coronary Angiography;  Surgeon: Jettie Booze, MD;  Location: [facility] CV LAB;  Service: Cardiovascular;  Laterality: N/A;   CARDIAC DEFIBRILLATOR PLACEMENT  05/24/2004   Implantation of a MDT single-chamber defibrillator   CAROTID STENT  09/11/2003   Percutaneous transluminal angioplasty and stent placement of the left internal carotid artery.   CATARACT EXTRACTION W/ INTRAOCULAR LENS  IMPLANT, BILATERAL Bilateral ~ 2010   [place]   "2" (12/25/2012)   CYSTOSCOPY     INSERT / REPLACE / REMOVE PACEMAKER     LEAD REVISION N/A 12/25/2012   Procedure: LEAD REVISION;  Surgeon: Evans Lance, MD;  Location: [facility] CATH LAB;  Service: Cardiovascular;  Laterality: N/A;     reports that he quit smoking about 51 years ago. His smoking use included cigarettes. He has never used smokeless tobacco. He reports that he does not drink alcohol or use drugs.  No Known Allergies  Family History  Problem Relation Age of Onset   Diabetes Mother    Healthy Father    Diabetes Brother    Heart attack Neg Hx    Stroke Neg Hx      Prior to Admission medications   Medication Sig Start Date End Date Taking? Authorizing Provider  alum & mag hydroxide-simeth (MAALOX/MYLANTA) 200-200-20 MG/5ML suspension Take 15 mLs by mouth every 6 (six) hours as needed for indigestion or heartburn. 03/27/19  Yes Wieters, Hallie C, PA-C  aspirin EC 81 MG tablet Take 81 mg by mouth daily.   Yes [provider]  atorvastatin (LIPITOR) 40 MG tablet TAKE 1 TABLET(40 MG) BY MOUTH DAILY AT 6 PM Patient taking differently: Take 40 mg by mouth daily at 6 PM.  03/03/19  Yes Axel Filler, MD  Blood Glucose Monitoring Suppl (ACCU-CHEK AVIVA PLUS) w/Device KIT Check finger stick glucose once daily 05/06/18  Yes Axel Filler, MD  carvedilol (COREG) 12.5 MG tablet Take 12.5 mg by mouth daily  before breakfast.   Yes [provider]  furosemide (LASIX) 40 MG tablet Take 1 tablet (40 mg total) by mouth 2 (two) times daily. Patient taking differently: Take 20 mg by mouth 2 (two) times daily.  04/14/19  Yes Evans Lance, MD  glucose blood (ACCU-CHEK AVIVA PLUS) test strip Check blood sugar up to 3 times a day 08/06/17  Yes Axel Filler, MD  Hydrocortisone, Perianal, (PROCTO-PAK) 1 % CREA Apply 1 application topically 2 (two) times daily. 07/25/19  Yes Raylene Everts, MD  insulin glargine (LANTUS) 100 UNIT/ML injection Inject 0.07 mLs (7 Units total) into the skin daily. 05/27/19  Yes Madalyn Rob, MD  Insulin Pen Needle (B-D ULTRAFINE III SHORT PEN) 31G X 8 MM MISC USE ONCE DAILY WITH LANTUS PEN 05/06/18  Yes Axel Filler, MD  lidocaine (XYLOCAINE) 2 % jelly Apply 1  application topically as needed. Patient taking differently: Apply 1 application topically as needed (wound).  07/25/19  Yes Raylene Everts, MD  pantoprazole (PROTONIX) 40 MG tablet TAKE 1 TABLET(40 MG) BY MOUTH DAILY Patient taking differently: Take 40 mg by mouth daily.  03/11/19  Yes Axel Filler, MD  polyethylene glycol [facility] / Floria Raveling) packet Take 17 g by mouth daily. Patient taking differently: Take 17 g by mouth daily as needed for mild constipation.  09/13/17  Yes Jola Schmidt, MD  senna-docusate (SENOKOT-S) 8.6-50 MG tablet Take 2 tablets by mouth daily. Patient taking differently: Take 2 tablets by mouth daily as needed for mild constipation.  05/04/16  Yes Konrad Felix, PA  tamsulosin (FLOMAX) 0.4 MG CAPS capsule Take 0.8 mg by mouth at bedtime.    Yes [provider]  VENTOLIN HFA 108 (90 Base) MCG/ACT inhaler INHALE 2 PUFFS INTO THE LUNGS EVERY 6 HOURS AS NEEDED FOR WHEEZING OR SHORTNESS OF BREATH Patient taking differently: Inhale 2 puffs into the lungs every 6 (six) hours as needed for wheezing or shortness of breath.  03/15/16  Yes Axel Filler, MD    Physical Exam: Vitals:   07/28/19 0700 07/28/19 0715 07/28/19 0730 07/28/19 0745  BP:   129/66   Pulse: 75 67 73 67  Resp: 17     Temp:      TempSrc:      SpO2: 99% 100% 100% 100%    Constitutional: NAD, calm, comfortable Vitals:   07/28/19 0700 07/28/19 0715 07/28/19 0730 07/28/19 0745  BP:   129/66   Pulse: 75 67 73 67  Resp: 17     Temp:      TempSrc:      SpO2: 99% 100% 100% 100%   General: pleasant, thin, chronically ill appearing, hard of hearing Eyes: EOMI, anicteric sclera ENMT: Mucous membranes are moist. Posterior pharynx clear of any exudate or lesions.  Hoarse voice.   Neck: normal, supple, no masses, no thyromegaly Respiratory: clear to auscultation bilaterally, no wheezing, no crackles. Normal respiratory effort. No accessory muscle use.  Cardiovascular: Regular rate and rhythm, no murmurs / rubs / gallops. Trace edema.  AICD present and easily visible. Abdomen: no tenderness, no masses palpated. No hepatosplenomegaly. Bowel sounds hypoactive. Musculoskeletal: no clubbing / cyanosis. No joint deformity upper and lower extremities. Good ROM, no contractures. Normal muscle tone.  Skin: no rashes, lesions, ulcers. No induration Neurologic: CN 2-12 grossly intact. Motor and sensory exam grossly intact. Psychiatric: Normal judgment and insight. Alert and oriented x 3. Normal mood.    Labs on Admission: I have personally reviewed following labs and imaging studies  CBC: Recent Labs  Lab 07/22/19 1230 07/28/19 0059 07/28/19 0115  WBC 6.4 7.1  --   NEUTROABS  --  4.9  --   HGB 11.3* 13.5 15.0  HCT 36.5* 43.9 44.0  MCV 95.5 95.2  --   PLT 214 249  --    Basic Metabolic Panel: Recent Labs  Lab 07/22/19 1230 07/28/19 0115  NA 136 140  K 4.7 4.9  CL 105 105  CO2 23  --   GLUCOSE 138* 133*  BUN 31* 42*  CREATININE 2.04* 2.20*  CALCIUM 9.4  --    GFR: Estimated Creatinine Clearance: 19.7 mL/min (A) (by C-G formula based on SCr of 2.2  mg/dL (H)). Liver Function Tests: No results for input(s): AST, ALT, ALKPHOS, BILITOT, PROT, ALBUMIN in the last 168 hours. No results for input(s): LIPASE, AMYLASE in  the last 168 hours. No results for input(s): AMMONIA in the last 168 hours. Coagulation Profile: No results for input(s): INR, PROTIME in the last 168 hours. Cardiac Enzymes: No results for input(s): CKTOTAL, CKMB, CKMBINDEX, TROPONINI in the last 168 hours. BNP (last 3 results) No results for input(s): PROBNP in the last 8760 hours. HbA1C: No results for input(s): HGBA1C in the last 72 hours. CBG: Recent Labs  Lab 07/22/19 1113  GLUCAP 140*   Lipid Profile: No results for input(s): CHOL, HDL, LDLCALC, TRIG, CHOLHDL, LDLDIRECT in the last 72 hours. Thyroid Function Tests: No results for input(s): TSH, T4TOTAL, FREET4, T3FREE, THYROIDAB in the last 72 hours. Anemia Panel: No results for input(s): VITAMINB12, FOLATE, FERRITIN, TIBC, IRON, RETICCTPCT in the last 72 hours. Urine analysis:    Component Value Date/Time   COLORURINE YELLOW 07/28/2019 0229   APPEARANCEUR CLEAR 07/28/2019 0229   LABSPEC 1.012 07/28/2019 0229   PHURINE 5.0 07/28/2019 0229   GLUCOSEU NEGATIVE 07/28/2019 0229   GLUCOSEU 500 (A) 05/12/2009 2130   HGBUR NEGATIVE 07/28/2019 0229   BILIRUBINUR NEGATIVE 07/28/2019 0229   KETONESUR 5 (A) 07/28/2019 0229   PROTEINUR NEGATIVE 07/28/2019 0229   UROBILINOGEN 1.0 01/16/2015 1630   NITRITE NEGATIVE 07/28/2019 0229   LEUKOCYTESUR NEGATIVE 07/28/2019 0229    Radiological Exams on Admission: Dg Abd Acute 2+v W 1v Chest  Result Date: 07/28/2019 CLINICAL DATA:  Constipation EXAM: DG ABDOMEN ACUTE W/ 1V CHEST COMPARISON:  CT 04/23/2017 FINDINGS: Left AICD in place.  Mild cardiomegaly.  Lungs clear.  No effusions. Mild diffuse gaseous distention of bowel could reflect ileus. No free air. No organomegaly or suspicious calcification. Degenerative changes in the lumbar spine and hips. No acute bony  abnormality. IMPRESSION: Mild diffuse gaseous distention of bowel may reflect ileus. No acute cardiopulmonary disease. Electronically Signed   By: Rolm Baptise M.D.   On: 07/28/2019 00:11   Ct Renal Stone Study  Result Date: 07/28/2019 CLINICAL DATA:  Rectal pain EXAM: CT ABDOMEN AND PELVIS WITHOUT CONTRAST TECHNIQUE: Multidetector CT imaging of the abdomen and pelvis was performed following the standard protocol without IV contrast. COMPARISON:  April 23, 2017 FINDINGS: Lower chest: The lung bases are clear.The heart size is normal. There is a small pericardial effusion. Hepatobiliary: The liver is normal. Cholelithiasis without acute inflammation.There is no biliary ductal dilation. Pancreas: Normal contours without ductal dilatation. No peripancreatic fluid collection. Spleen: No splenic laceration or hematoma. Adrenals/Urinary Tract: --Adrenal glands: No adrenal hemorrhage. --Right kidney/ureter: There is mild right-sided hydroureteronephrosis. The right kidney is somewhat atrophic in comparison to the left. --Left kidney/ureter: There is mild left-sided hydroureteronephrosis to the level of the urinary bladder. --Urinary bladder: The urinary bladder demonstrates significant distension and mild bladder wall thickening. Stomach/Bowel: --Stomach/Duodenum: No hiatal hernia or other gastric abnormality. Normal duodenal course and caliber. --Small bowel: No dilatation or inflammation. --Colon: There is a large amount of stool throughout the colon. There is a large amount of stool in the rectum with rectal wall thickening and mild adjacent fat stranding. This appearance is similar to prior study. --Appendix: Normal. Vascular/Lymphatic: Atherosclerotic calcification is present within the non-aneurysmal abdominal aorta, without hemodynamically significant stenosis. --No retroperitoneal lymphadenopathy. --No mesenteric lymphadenopathy. --No pelvic or inguinal lymphadenopathy. Reproductive: There is severe  prostatomegaly. Other: No ascites or free air. The abdominal wall is normal. Musculoskeletal. No acute displaced fractures. IMPRESSION: 1. Mild bilateral hydroureteronephrosis that appears to be secondary to a distended urinary bladder. There is severe prostatomegaly likely causing bladder outlet obstruction. 2. Large  amount of stool throughout the colon. There is rectal wall thickening which may be secondary to proctitis or stercoral colitis. This is similar to prior study in 2018. 3.  Aortic Atherosclerosis (ICD10-I70.0). Electronically Signed   By: Constance Holster M.D.   On: 07/28/2019 02:08    Assessment/Plan ZAHARI XIANG is a 85 y.o. male with medical history significant of CAD/ICMP, CHF s/p AICD,  Carotid disease s/p stent, Hx of nephrolithiasis, HTN, HLD, T2DM/IDDM c/b retinopathy and nephropathy, [name], and TIA who presents with ongoing rectal pain  # AKI # Chronic urinary retention # BPH # Mild bilateral hydroureteronephrosis - Creatinine has steadily risen 1.66 (8/31) -> 2.04 (10/27) -> 2.20 (11/2 - Admission) - patient presenting with rectal pain and noted to have large stool burden despite home bowel regimen.  He had been admitted [facility] 2 months prior with urinary retention and AKI at that time and had been discharged to a SNF with a foley and subsequently had it removed with Urology in clinic. - CT obtain (non-contrast) which revealed distended urinary bladder and severe prostatomegaly and large stool burden; patient also had mild bilateral hydroureteronephrosis - patient noted to have distended bladder and urinary retention on arrival, underwent placement of foley - discussed with Urology, Dr. Claudia Desanctis, to discuss, but given known hx of BPH and chronic urinary retention, distended bladder and successful placement of foley catheter, would continue management now s/p bladder decompression, with foley and flomax given that level of pathology is the bladder.  Given his hx, she  recommended maintaining foley catheter during hospitalization and would not remove prior to a week and they would perform voiding trial in clinic - out-patient follow-up with urology - also anticipate improvement with management of constipation - monitor I/O - avoid nephrotoxic agents  # Constipation #  Possible Stercoral colitis - patient with ongoing constipation and now severe rectal pain; will trial aggressive bowel regimen, encourage mobilization and replete electrolytes accordingly - received a dose of Zosyn - Discussed with Dr. Paulita Fujita [facility]) and concurred with aggressive bowel regimen; will request manual disimpaction as this would assist with enema.  Ordered for stool softener, laxative and SMOG enema.  After d/w Dr. Paulita Fujita and given lack of systemic symptoms, no fever or leukocytosis, will hold off on antibiotics for now.  If above regimen fails would reach out to [facility] again, they may consider trial of disimpaction with C-scope but success with this is guarded  # Abnormal imaging - plain film with gaseous distension (no UGI abnormality on CT), but no upper GI symptoms per patient tolerating PO over weekend - will have SLP eval, and trial diet, if development of any obstructive symptoms, strict NPO, bowel rest  # CAD/ICMP # Chronic systolic CHF s/p AICD - appears euvolemic and no cp - Echo EF 30-35 in April 2017 - continue aspirin, atorvastatin, carvedilol (changed home dose of 12.5 mg daily to 6.25 mg BID)  # IDDM/T2DM - reduced lantus dose to 5 U daily (titrate up to 7 U home dose if BS at goal) - ISS low dose   # Abnormal vocal vord - patient with concerning findings to R. Vocal Fold concerning for malignancy; planned for out-patient direct laryngoscopy with biopsy and esophagoscopy - patient follows with Dr. Melida Quitter, ENT  # Severe Protein Calorie Malnutrition - consulted Nutrition and SLP (for swallow eval and dietary and supplement recommendations) -  PT/OT  # HLD - continue atorvastatin  # HTN - continue carvedilol  DVT prophylaxis:  SQH Code Status: Full Code Family Communication: Patient reports son would be decision maker, but friend [name] [phone]) Disposition Plan: pending Admission status: inpatient   Truddie Hidden MD [facility] Pager [phone]  If 7PM-7AM, please contact night-coverage [email or web address] Password [email or web address]  07/28/2019, 8:10 AM

## 2019-07-28 NOTE — ED Notes (Signed)
Bladder Scan >650

## 2019-07-28 NOTE — ED Notes (Signed)
Pt ambulated to restroom and back and stated "I had a very large bowel movement and do not need an enema. My bottom is too sore for that anyways".

## 2019-07-28 NOTE — Evaluation (Signed)
Clinical/Bedside Swallow Evaluation Patient Details  Name: [name] MRN: [id number] Date of Birth: 07/30/1931  Today's Date: 07/28/2019 Time: SLP Start Time (ACUTE ONLY): 32 SLP Stop Time (ACUTE ONLY): 1400 SLP Time Calculation (min) (ACUTE ONLY): 25 min  Past Medical History:  Past Medical History:  Diagnosis Date  . Adenomatous colon polyp 02/14/2012  . AICD (automatic cardioverter/defibrillator) present    Medtronic   . BBB (bundle branch block)    right  . Carotid stenosis    a. s/p L carotid stent 2004;  b. Carotid US (09/2014): Bilateral ICA 1-39%, left ECA >59%, normal subclavian bilaterally, occluded left vertebral >> FU 2 years  . Chronic kidney disease   . Chronic systolic CHF (congestive heart failure) (HCC)    a. ischemic CM EF 15-20%;  b. s/p AICD 05/24/04;  c. Echo 7/06: EF 30-40%, mild reduced RVSF, d. Echo 12/2015 EF 35-40%  . Elevated PSA   . History of kidney stones    x 1  . HTN (hypertension)   . Hyperlipidemia   . ICD (implantable cardiac defibrillator) in place 12-25-2012   MDT CRTD upgrade by Dr Lovena Le  . Myocardial infarction ([place]) 1990  . Pericardial effusion    Echocardiogram (09/2014): EF 25% with distal anterior, distal inferior, distal lateral and apical akinesis, grade 1 diastolic dysfunction, very mild aortic stenosis (mean 7 mmHg) - this may be depressed due to low EF (2-D images suggest mild to moderate aortic stenosis), large pericardial effusion, no RA collapse  . Pneumonia   . Presence of permanent cardiac pacemaker    Medtronic  . PVD (peripheral vascular disease) ([place])    s/p L carotid PTCA/stent 2004  . Transient ischemic attack   . Type II diabetes mellitus ([place])    type II   Past Surgical History:  Past Surgical History:  Procedure Laterality Date  . BI-VENTRICULAR IMPLANTABLE CARDIOVERTER DEFIBRILLATOR UPGRADE N/A 12/25/2012   Procedure: BI-VENTRICULAR IMPLANTABLE CARDIOVERTER DEFIBRILLATOR UPGRADE;  Surgeon: Evans Lance, MD;   Location: [facility] CATH LAB;  Service: Cardiovascular;  Laterality: N/A;  . BIV ICD UPGRADE  12/25/2012   MDT CRTD upgrade by Dr Lovena Le for ischemic cardiomyopathy and worsening conduction system disease  . CARDIAC CATHETERIZATION  06/2003,  01/2004  . CARDIAC CATHETERIZATION N/A 01/13/2016   Procedure: Left Heart Cath and Coronary Angiography;  Surgeon: Jettie Booze, MD;  Location: [facility] CV LAB;  Service: Cardiovascular;  Laterality: N/A;  . CARDIAC DEFIBRILLATOR PLACEMENT  05/24/2004   Implantation of a MDT single-chamber defibrillator  . CAROTID STENT  09/11/2003   Percutaneous transluminal angioplasty and stent placement of the left internal carotid artery.  Marland Kitchen CATARACT EXTRACTION W/ INTRAOCULAR LENS  IMPLANT, BILATERAL Bilateral ~ 2010  . CORONARY ANGIOPLASTY WITH STENT PLACEMENT  1990   "2" (12/25/2012)  . CYSTOSCOPY    . INSERT / REPLACE / REMOVE PACEMAKER    . LEAD REVISION N/A 12/25/2012   Procedure: LEAD REVISION;  Surgeon: Evans Lance, MD;  Location: [facility] CATH LAB;  Service: Cardiovascular;  Laterality: N/A;   HPI:  pt is an 85 yo admitted with constipation, rectal pain.  Pt has h/o chronic constipation and now with possible ileus per imaging study and lungs are clear.  He was admitted in August 2020 to [facility] with hypoglycemia and found to have pna. Hoarse voice noted in Sept for which pt saw an ENT. He was to have a biopsy taken from right vocal fold by ENT on day of admit but unable  due to bottom pain.  MBS completed 04/2009 showed aspiration of thin with sensation and significant residuals.  Advised diet was dys1/nectar and pt has been consuming said diet since that time per pt.   Assessment / Plan / Recommendation Clinical Impression  Patient has h/o dysphagia with aspiration and pharyngeal retention. Given concern for bowel issues, known h/o aspiration and premorbid diet - SLP only tested clears nectar thick and jello.  Pt without overt indication of aspiration but appears with  inconsistent swallow triggering suspected.  He inconsistently tucks his chin and regardless is not coughing during po (audible aspiration of thin on MBS).  Of note, pt reflexively conducted multiple swallows and throat clearing during MBS which was effective to help him compensate for his dysphagia.  Recommend to restart clear diet - nectar thick and SlP follow up. Suspect pt may have mild worsening of dysphagia if vocal fold findings/abnormalities are new since Sept 2020.  Pt denies voice hoarseness prior to pna. SLP Visit Diagnosis: Dysphagia, oropharyngeal phase (R13.12)    Aspiration Risk  Mild aspiration risk    Diet Recommendation Nectar-thick liquid(clears, nectar)   Liquid Administration via: Straw Medication Administration: Whole meds with puree Supervision: Patient able to self feed Compensations: Slow rate;Small sips/bites;Chin tuck Postural Changes: Seated upright at 90 degrees;Remain upright for at least 30 minutes after po intake    Other  Recommendations Oral Care Recommendations: Oral care BID Other Recommendations: Order thickener from pharmacy   Follow up Recommendations        Frequency and Duration min 2x/week  1 week       Prognosis Prognosis for Safe Diet Advancement: Good      Swallow Study   General HPI: pt is an 85 yo admitted with constipation, rectal pain.  Pt has h/o chronic constipation and now with possible ileus per imaging study and lungs are clear.  He was admitted in August 2020 to [facility] with hypoglycemia and found to have pna. Hoarse voice noted in Sept for which pt saw an ENT. He was to have a biopsy taken from right vocal fold by ENT on day of admit but unable due to bottom pain.  MBS completed 04/2009 showed aspiration of thin with sensation and significant residuals.  Advised diet was dys1/nectar and pt has been consuming said diet since that time per pt. Type of Study: Bedside Swallow Evaluation Previous Swallow Assessment: mbs - see HPI Diet  Prior to this Study: NPO Temperature Spikes Noted: No Respiratory Status: Room air History of Recent Intubation: No Behavior/Cognition: Alert;Cooperative;Pleasant mood(HOH) Oral Cavity Assessment: Within Functional Limits Oral Care Completed by SLP: No Oral Cavity - Dentition: Edentulous Vision: Functional for self-feeding Self-Feeding Abilities: Able to feed self Patient Positioning: Upright in bed Baseline Vocal Quality: Hoarse(per ENT note, pt requiring vocal fold biopsy due to concerning finding on right vocal fold) Volitional Cough: Cognitively unable to elicit Volitional Swallow: Able to elicit    Oral/Motor/Sensory Function Overall Oral Motor/Sensory Function: Within functional limits   Ice Chips Ice chips: Not tested   Thin Liquid Thin Liquid: Not tested Other Comments: due to h/o aspiration on mbs and current medical status    Nectar Thick Nectar Thick Liquid: Impaired Oral Phase Impairments: Reduced lingual movement/coordination Pharyngeal Phase Impairments: Throat Clearing - Immediate;Throat Clearing - Delayed Other Comments: chin tuck posture observed to be inconsistently produced, however pt without indication of aspiration with small boluses even in head neutral position   Honey Thick Honey Thick Liquid: Not tested   Puree  Puree: Within functional limits Presentation: Spoon;Self Fed   Solid     Solid: Not tested Other Comments: pt for clears per Md if approved for diet      Macario Golds 07/28/2019,2:14 PM    Luanna Salk, [facility] SLP [place] Pager [phone] Office [phone]

## 2019-07-29 DIAGNOSIS — N1330 Unspecified hydronephrosis: Secondary | ICD-10-CM

## 2019-07-29 LAB — BASIC METABOLIC PANEL
Anion gap: 7 (ref 5–15)
BUN: 39 mg/dL — ABNORMAL HIGH (ref 8–23)
CO2: 26 mmol/L (ref 22–32)
Calcium: 9.4 mg/dL (ref 8.9–10.3)
Chloride: 107 mmol/L (ref 98–111)
Creatinine, Ser: 2.22 mg/dL — ABNORMAL HIGH (ref 0.61–1.24)
GFR calc Af Amer: 30 mL/min — ABNORMAL LOW (ref >60)
GFR calc non Af Amer: 26 mL/min — ABNORMAL LOW (ref >60)
Glucose, Bld: 76 mg/dL (ref 70–99)
Potassium: 4.1 mmol/L (ref 3.5–5.1)
Sodium: 140 mmol/L (ref 135–145)

## 2019-07-29 LAB — CBC
HCT: 37.7 % — ABNORMAL LOW (ref 39.0–52.0)
Hemoglobin: 11.7 g/dL — ABNORMAL LOW (ref 13.0–17.0)
MCH: 29.5 pg (ref 26.0–34.0)
MCHC: 31 g/dL (ref 30.0–36.0)
MCV: 95 fL (ref 80.0–100.0)
Platelets: 271 10*3/uL (ref 150–400)
RBC: 3.97 MIL/uL — ABNORMAL LOW (ref 4.22–5.81)
RDW: 14.9 % (ref 11.5–15.5)
WBC: 8.1 10*3/uL (ref 4.0–10.5)
nRBC: 0 % (ref 0.0–0.2)

## 2019-07-29 LAB — GLUCOSE, CAPILLARY
Glucose-Capillary: 40 mg/dL — CL (ref 70–99)
Glucose-Capillary: 44 mg/dL — CL (ref 70–99)
Glucose-Capillary: 77 mg/dL (ref 70–99)
Glucose-Capillary: 115 mg/dL — ABNORMAL HIGH (ref 70–99)
Glucose-Capillary: 134 mg/dL — ABNORMAL HIGH (ref 70–99)
Glucose-Capillary: 64 mg/dL — ABNORMAL LOW (ref 70–99)
Glucose-Capillary: 133 mg/dL — ABNORMAL HIGH (ref 70–99)

## 2019-07-29 MED ORDER — GLUCOSE 4 G PO CHEW
CHEWABLE_TABLET | ORAL | Status: AC
  Administered 2019-07-29: 08:00:00
  Filled 2019-07-29: qty 1

## 2019-07-29 MED ORDER — BOOST / RESOURCE BREEZE PO LIQD CUSTOM
1.0000 | Freq: Three times a day (TID) | ORAL | Status: DC
  Administered 2019-07-29 – 2019-07-31 (×5): 1 via ORAL

## 2019-07-29 MED ORDER — HYDROCORTISONE 1 % EX CREA
1.0000 "application " | TOPICAL_CREAM | Freq: Two times a day (BID) | CUTANEOUS | Status: DC
  Administered 2019-07-29 – 2019-07-31 (×5): 1 via TOPICAL
  Filled 2019-07-29: qty 28

## 2019-07-29 MED FILL — Hydrocortisone Cream 1%: CUTANEOUS | Qty: 28 | Status: AC

## 2019-07-29 NOTE — Progress Notes (Signed)
Inpatient Diabetes Program Recommendations  AACE/ADA: New Consensus Statement on Inpatient Glycemic Control (2015)  Target Ranges:  Prepandial:   less than 140 mg/dL      Peak postprandial:   less than 180 mg/dL (1-2 hours)      Critically ill patients:  140 - 180 mg/dL   Results for MARCOANTONIO, LEGAULT (MRN [id number]) as of 07/29/2019 07:53  Ref. Range 07/28/2019 11:34 07/28/2019 16:54 07/28/2019 21:03 07/29/2019 07:39  Glucose-Capillary Latest Ref Range: 70 - 99 mg/dL 88  5 units LANTUS given at 10:30am 83 120 (H) 40 (LL)     Admit with: Rectal Pain/ Acute Kidney Injury/ Possible Stercoral colitis  History: DM, CHF, [name]  Home DM Meds: Lantus 7 units Daily  Current Orders: Lantus 5 units Daily      Novolog Sensitive Correction Scale/ SSI (0-9 units) TID AC       MD- Note patient received 5 units Lantus yesterday at 10:30am.  Hypoglycemic this AM (CBG 40)  Please consider stopping Lantus for now.  Continue Novolog SSI     --Will follow patient during hospitalization--  Wyn Quaker RN, MSN, CDE Diabetes Coordinator Inpatient Glycemic Control Team Team Pager: [phone] (8a-5p)

## 2019-07-29 NOTE — Evaluation (Signed)
Occupational Therapy Evaluation Patient Details Name: [name] MRN: [id number] DOB: 12/08/1930 Today's Date: 07/29/2019    History of Present Illness 85 yo male admitted with rectal pain, AKI. Hx of CAD, CHF, DM, retinopathy, [name], BBB, carotid stenosis, BPH, ICD, PVD, TIA, ongoing rectal pain   Clinical Impression   Pt was admitted for the above.  Unsure of reliability of information provided:  See cognitive section. Per pt, he lives alone and has a friend assist with groceries only.  Pt needs mod A for LB adls, hygiene and could get up to chair with min guard assist. Will follow in acute setting with the goals listed below.    Follow Up Recommendations  Home health OT;SNF(vs, depending on progress)    Equipment Recommendations  (tba further)    Recommendations for Other Services       Precautions / Restrictions Precautions Precautions: Fall Restrictions Weight Bearing Restrictions: No      Mobility Bed Mobility               General bed mobility comments: supervision for OOB with use of rail and HOB raised  Transfers                 General transfer comment: min guard with [facility] for safety    Balance                                           ADL either performed or assessed with clinical judgement   ADL Overall ADL's : Needs assistance/impaired Eating/Feeding: Supervision/ safety Eating/Feeding Details (indicate cue type and reason): nectar thick liquids with strategies Grooming: Wash/dry face;Set up   Upper Body Bathing: Set up   Lower Body Bathing: Moderate assistance   Upper Body Dressing : Set up   Lower Body Dressing: Moderate assistance   Toilet Transfer: Min guard;Stand-pivot(chair with [employer])   Toileting- Clothing Manipulation and Hygiene: Maximal assistance         General ADL Comments: pt was incontinent x bowel. Assisted him with cleaning up and transferred to chair.  Pillow on seat cushion for comfort.   Pt states he uses a sock aide but can generally get pants on; could not reach far enough today     Vision         Perception     Praxis      Pertinent Vitals/Pain Pain Assessment: Faces Faces Pain Scale: Hurts a little bit Pain Location: rectum Pain Intervention(s): Limited activity within patient's tolerance;Repositioned;Monitored during session     Hand Dominance     Extremity/Trunk Assessment Upper Extremity Assessment Upper Extremity Assessment: Overall WFL for tasks assessed           Communication Communication Communication: HOH   Cognition Arousal/Alertness: Awake/alert Behavior During Therapy: WFL for tasks assessed/performed                                   General Comments: unsure of cognition. Pt stated he had called for assistance after being incontinent x bowel, then said, he can't feel it here, but could feel it at home   General Comments       Exercises     Shoulder Instructions      Home Living Family/patient expects to be discharged to:: Private residence Living Arrangements: Alone   Type  of Home: Apartment             Bathroom Shower/Tub: Teacher, early years/pre: Standard     Home Equipment: Environmental consultant - 4 wheels;Cane - quad   Additional Comments: uses sock aide      Prior Functioning/Environment Level of Independence: Independent with assistive device(s)        Comments: pt stated he uses his large base quad cane for ambulation. ADLs/meals without assistance.  Friend either takes him grocery shopping or does it for him        OT Problem List: Decreased strength;Decreased activity tolerance;Impaired balance (sitting and/or standing);Pain;Decreased knowledge of use of DME or AE(? cognition, tba further)      OT Treatment/Interventions: Self-care/ADL training;DME and/or AE instruction;Patient/family education;Balance training;Therapeutic activities;Energy conservation    OT Goals(Current goals can  be found in the care plan section) Acute Rehab OT Goals Patient Stated Goal: none stated; agreeable to OOB OT Goal Formulation: With patient Time For Goal Achievement: 08/12/19 Potential to Achieve Goals: Good ADL Goals Pt Will Transfer to Toilet: bedside commode;ambulating;with supervision Pt Will Perform Toileting - Clothing Manipulation and hygiene: with modified independence;sit to/from stand Additional ADL Goal #1: pt will gather clothes at supervision level and complete adl with ae as needed without supervision  OT Frequency: Min 2X/week   Barriers to D/C:            Co-evaluation              AM-PAC OT "6 Clicks" Daily Activity     Outcome Measure Help from another person eating meals?: A Little Help from another person taking care of personal grooming?: A Little Help from another person toileting, which includes using toliet, bedpan, or urinal?: A Lot Help from another person bathing (including washing, rinsing, drying)?: A Lot Help from another person to put on and taking off regular upper body clothing?: A Little Help from another person to put on and taking off regular lower body clothing?: A Lot 6 Click Score: 15   End of Session Nurse Communication: (pt up in chair)  Activity Tolerance: Patient tolerated treatment well Patient left: in chair;with call bell/phone within reach  OT Visit Diagnosis: Unsteadiness on feet (R26.81)                Time: 6160-7371 OT Time Calculation (min): 20 min Charges:  OT General Charges $OT Visit: 1 Visit OT Evaluation $OT Eval Low Complexity: [place], OTR/L Acute Rehabilitation Services [phone] [facility] pager [phone] office 07/29/2019  [place] 07/29/2019, 11:34 AM

## 2019-07-29 NOTE — Progress Notes (Signed)
Initial Nutrition Assessment  DOCUMENTATION CODES:   Underweight, Severe malnutrition in context of chronic illness  INTERVENTION:   -Boost Breeze po TID, each supplement provides 250 kcal and 9 grams of protein -thickened  -Once diet is advanced, recommend Glucerna Shake po TID, each supplement provides 220 kcal and 10 grams of protein  NUTRITION DIAGNOSIS:   Severe Malnutrition related to chronic illness(DM, [name], CHF) as evidenced by severe fat depletion, severe muscle depletion, percent weight loss.  GOAL:   Patient will meet greater than or equal to 90% of their needs  MONITOR:   PO intake, Supplement acceptance, Labs, Weight trends, Diet advancement, I & O's  REASON FOR ASSESSMENT:   Consult Assessment of nutrition requirement/status, Malnutrition Eval  ASSESSMENT:   85 y.o. male with medical history significant of CAD/ICMP, CHF s/p AICD,  Carotid disease s/p stent, Hx of nephrolithiasis, HTN, HLD, T2DM/IDDM c/b retinopathy and nephropathy, [name], and TIA who presents with ongoing rectal pain.  Patient reports feeling hungry and just finished some chicken broth which he enjoyed. Pt is HOH but was able to state he has not been eating well recently.  Pt is interested in trying Boost Breeze supplements, thickened to nectar thick consistency per SLP recommendations.   Pt reports a UBW of 272 lbs (uncertain when he last weighed this much). Most recently he states he has weighed around 130 lbs. Current weight is 116 lbs. Pt has had a 9% wt loss x 5 months, significant for time frame.  I/Os: -1.2L since admit UOP: 600 ml so far today  Medications: Folic acid tablet Labs reviewed: CBGs: 77-115  NUTRITION - FOCUSED PHYSICAL EXAM:    Most Recent Value  Orbital Region  Severe depletion  Upper Arm Region  Severe depletion  Thoracic and Lumbar Region  Unable to assess  Buccal Region  Severe depletion  Temple Region  Severe depletion  Clavicle Bone Region  Severe depletion   Clavicle and Acromion Bone Region  Severe depletion  Scapular Bone Region  Severe depletion  Dorsal Hand  Severe depletion  Patellar Region  Severe depletion  Anterior Thigh Region  Severe depletion  Posterior Calf Region  Severe depletion  Edema (RD Assessment)  None       Diet Order:   Diet Order            Diet clear liquid Room service appropriate? Yes; Fluid consistency: Nectar Thick  Diet effective now              EDUCATION NEEDS:   No education needs have been identified at this time  Skin:  Skin Assessment: Reviewed RN Assessment  Last BM:  11/2-type 7  Height:   Ht Readings from Last 1 Encounters:  07/28/19 5\' 11"  (1.803 m)    Weight:   Wt Readings from Last 1 Encounters:  07/29/19 52.8 kg    Ideal Body Weight:  78.1 kg  BMI:  Body mass index is 16.23 kg/m.  Estimated Nutritional Needs:   Kcal:  1600-1800  Protein:  70-80g  Fluid:  1.6L/day   Clayton Bibles, MS, RD, LDN Inpatient Clinical Dietitian Pager: [phone] After Hours Pager: [phone]

## 2019-07-29 NOTE — Progress Notes (Signed)
Hypoglycemic Event  CBG: 64  Treatment: 15 grams of carbs  Symptoms: None  Follow-up CBG: Time:2225 CBG Result:133  Possible Reasons for Event: Decreased intake  Comments/MD notified:    Salvatore Poe

## 2019-07-29 NOTE — Progress Notes (Signed)
PROGRESS NOTE  [name] AST:[id number] DOB: [birth year] DOA: 07/27/2019 PCP: Axel Filler, MD  Hospital Course/Subjective: [name] is a 85 y.o. male with medical history significant of CAD/ICMP, CHF s/p AICD,  Carotid disease s/p stent, Hx of nephrolithiasis, HTN, HLD, T2DM/IDDM c/b retinopathy and nephropathy, [name], and TIA who presents with ongoing rectal pain. Treating his acute on chronic renal failure and has had a bowel movement this AM after aggressive bowel regimen and is feeling better today. Case was also discussed with urology, foley catheter in place and will remain there until outpatient urology follow up.  # AKI # Chronic urinary retention # BPH # Mild bilateral hydroureteronephrosis - Creatinine has steadily risen 1.66 (8/31) -> 2.04 (10/27) -> 2.20 (11/2 - Admission) - patient presenting with rectal pain and noted to have large stool burden despite home bowel regimen. Now has had BM. - CT obtain (non-contrast) which revealed distended urinary bladder and severe prostatomegaly and large stool burden; patient also had mild bilateral hydroureteronephrosis which should be improving now that bladder has been decompressed with foley - admitting MD discussed with Urology, Dr. Claudia Desanctis, to discuss, but given known hx of BPH and chronic urinary retention, distended bladder and successful placement of foley catheter, would continue management now s/p bladder decompression, with foley and flomax given that level of pathology is the bladder.  Given his hx, she recommended maintaining foley catheter during hospitalization and would not remove prior to a week and they would perform voiding trial in clinic - out-patient follow-up with urology - also anticipate improvement with management of constipation - monitor I/O - avoid nephrotoxic agents  # Constipation #  Possible Stercoral colitis - patient with ongoing constipation and now severe rectal pain; will trial  aggressive bowel regimen, encourage mobilization and replete electrolytes accordingly - received a dose of Zosyn - admitting MD Discussed with Dr. Paulita Fujita [facility]) and concurred with aggressive bowel regimen; will request manual disimpaction as this would assist with enema.  Ordered for stool softener, laxative and SMOG enema.  After d/w Dr. Paulita Fujita and given lack of systemic symptoms, no fever or leukocytosis, will hold off on antibiotics for now.  If above regimen fails would reach out to [facility] again, they may consider trial of disimpaction with C-scope but success with this is guarded  # Abnormal imaging - plain film with gaseous distension (no UGI abnormality on CT), but no upper GI symptoms per patient tolerating PO over weekend - undewent SLP evaluation and is on appropriate diet at this time  # CAD/ICMP # Chronic systolic CHF s/p AICD - appears euvolemic and no cp - Echo EF 30-35 in April 2017 - continue aspirin, atorvastatin, carvedilol (changed home dose of 12.5 mg daily to 6.25 mg BID)  # IDDM/T2DM - reduced lantus dose to 5 U daily (titrate up to 7 U home dose if BS at goal) - ISS low dose   # Abnormal vocal vord - patient with concerning findings to R. Vocal Fold concerning for malignancy; planned for out-patient direct laryngoscopy with biopsy and esophagoscopy - patient follows with Dr. Melida Quitter, ENT  # Severe Protein Calorie Malnutrition - consulted Nutrition and SLP (for swallow eval and dietary and supplement recommendations) - PT/OT  # HLD - continue atorvastatin  # HTN - continue carvedilol  DVT prophylaxis: SQH Code Status: Full Code Family Communication: Patient reports son would be decision maker, [name] [phone]) Disposition Plan: pending Admission status: inpatient    Objective: Vitals:  07/28/19 2057 07/28/19 2101 07/29/19 0342 07/29/19 0433  BP: (!) 96/48 100/62  128/65  Pulse: 64   66  Resp: 18   12  Temp: 97.7 F (36.5 C)    97.7 F (36.5 C)  TempSrc: Oral   Oral  SpO2: 98%   100%  Weight:   50.9 kg 52.8 kg  Height:        Intake/Output Summary (Last 24 hours) at 07/29/2019 0913 Last data filed at 07/29/2019 0600 Gross per 24 hour  Intake 120 ml  Output 625 ml  Net -505 ml   Filed Weights   07/29/19 0342 07/29/19 0433  Weight: 50.9 kg 52.8 kg     Exam: General:  Alert, oriented, thin elderly male comfortable Eyes: EOMI, clear sclerea Neck: supple, no masses, trachea mildline  Cardiovascular: RRR, no murmurs or rubs, no peripheral edema  Respiratory: clear to auscultation bilaterally, no wheezes, no crackles  Abdomen: soft, nontender, nondistended, normal bowel tones heard  Skin: dry, no rashes  GU: foley in place with dark yellow urine Musculoskeletal: no joint effusions, normal range of motion  Psychiatric: appropriate affect, normal speech  Neurologic: extraocular muscles intact, clear speech, moving all extremities with intact sensorium    Data Reviewed: CBC: Recent Labs  Lab 07/22/19 1230 07/28/19 0059 07/28/19 0115 07/29/19 0335  WBC 6.4 7.1  --  8.1  NEUTROABS  --  4.9  --   --   HGB 11.3* 13.5 15.0 11.7*  HCT 36.5* 43.9 44.0 37.7*  MCV 95.5 95.2  --  95.0  PLT 214 249  --  161   Basic Metabolic Panel: Recent Labs  Lab 07/22/19 1230 07/28/19 0115 07/29/19 0335  NA 136 140 140  K 4.7 4.9 4.1  CL 105 105 107  CO2 23  --  26  GLUCOSE 138* 133* 76  BUN 31* 42* 39*  CREATININE 2.04* 2.20* 2.22*  CALCIUM 9.4  --  9.4   GFR: Estimated Creatinine Clearance: 17.8 mL/min (A) (by C-G formula based on SCr of 2.22 mg/dL (H)). Liver Function Tests: No results for input(s): AST, ALT, ALKPHOS, BILITOT, PROT, ALBUMIN in the last 168 hours. No results for input(s): LIPASE, AMYLASE in the last 168 hours. No results for input(s): AMMONIA in the last 168 hours. Coagulation Profile: No results for input(s): INR, PROTIME in the last 168 hours. Cardiac Enzymes: No results for  input(s): CKTOTAL, CKMB, CKMBINDEX, TROPONINI in the last 168 hours. BNP (last 3 results) No results for input(s): PROBNP in the last 8760 hours. HbA1C: No results for input(s): HGBA1C in the last 72 hours. CBG: Recent Labs  Lab 07/28/19 1654 07/28/19 2103 07/29/19 0739 07/29/19 0801 07/29/19 0835  GLUCAP 83 120* 40* 44* 77   Lipid Profile: No results for input(s): CHOL, HDL, LDLCALC, TRIG, CHOLHDL, LDLDIRECT in the last 72 hours. Thyroid Function Tests: No results for input(s): TSH, T4TOTAL, FREET4, T3FREE, THYROIDAB in the last 72 hours. Anemia Panel: No results for input(s): VITAMINB12, FOLATE, FERRITIN, TIBC, IRON, RETICCTPCT in the last 72 hours. Urine analysis:    Component Value Date/Time   COLORURINE YELLOW 07/28/2019 0229   APPEARANCEUR CLEAR 07/28/2019 0229   LABSPEC 1.012 07/28/2019 0229   PHURINE 5.0 07/28/2019 0229   GLUCOSEU NEGATIVE 07/28/2019 0229   GLUCOSEU 500 (A) 05/12/2009 2130   HGBUR NEGATIVE 07/28/2019 0229   BILIRUBINUR NEGATIVE 07/28/2019 0229   KETONESUR 5 (A) 07/28/2019 0229   PROTEINUR NEGATIVE 07/28/2019 0229   UROBILINOGEN 1.0 01/16/2015 1630   NITRITE NEGATIVE  07/28/2019 0229   LEUKOCYTESUR NEGATIVE 07/28/2019 0229   Sepsis Labs: @LABRCNTIP (procalcitonin:4,lacticidven:4)  ) Recent Results (from the past 240 hour(s))  SARS CORONAVIRUS 2 (TAT 6-24 HRS) Nasopharyngeal Nasopharyngeal Swab     Status: None   Collection Time: 07/24/19 10:16 AM   Specimen: Nasopharyngeal Swab  Result Value Ref Range Status   SARS Coronavirus 2 NEGATIVE NEGATIVE Final    Comment: (NOTE) SARS-CoV-2 target nucleic acids are NOT DETECTED. The SARS-CoV-2 RNA is generally detectable in upper and lower respiratory specimens during the acute phase of infection. Negative results do not preclude SARS-CoV-2 infection, do not rule out co-infections with other pathogens, and should not be used as the sole basis for treatment or other patient management decisions.  Negative results must be combined with clinical observations, patient history, and epidemiological information. The expected result is Negative. Fact Sheet for Patients: SugarRoll.be Fact Sheet for Healthcare Providers: https://www.woods-mathews.com/ This test is not yet approved or cleared by the [place] FDA and  has been authorized for detection and/or diagnosis of SARS-CoV-2 by FDA under an Emergency Use Authorization (EUA). This EUA will remain  in effect (meaning this test can be used) for the duration of the COVID-19 declaration under Section 56 4(b)(1) of the Act, 21 U.S.C. section 360bbb-3(b)(1), unless the authorization is terminated or revoked sooner. Performed at [facility], [place] [address] [place], [place] [address]   SARS CORONAVIRUS 2 (TAT 6-24 HRS) Nasopharyngeal Nasopharyngeal Swab     Status: None   Collection Time: 07/28/19  2:29 AM   Specimen: Nasopharyngeal Swab  Result Value Ref Range Status   SARS Coronavirus 2 NEGATIVE NEGATIVE Final    Comment: (NOTE) SARS-CoV-2 target nucleic acids are NOT DETECTED. The SARS-CoV-2 RNA is generally detectable in upper and lower respiratory specimens during the acute phase of infection. Negative results do not preclude SARS-CoV-2 infection, do not rule out co-infections with other pathogens, and should not be used as the sole basis for treatment or other patient management decisions. Negative results must be combined with clinical observations, patient history, and epidemiological information. The expected result is Negative. Fact Sheet for Patients: SugarRoll.be Fact Sheet for Healthcare Providers: https://www.woods-mathews.com/ This test is not yet approved or cleared by the [place] FDA and  has been authorized for detection and/or diagnosis of SARS-CoV-2 by FDA under an Emergency Use Authorization (EUA). This EUA will  remain  in effect (meaning this test can be used) for the duration of the COVID-19 declaration under Section 56 4(b)(1) of the Act, 21 U.S.C. section 360bbb-3(b)(1), unless the authorization is terminated or revoked sooner. Performed at [facility], [place] [address], [place], [place] [address]      Studies: No results found.  Scheduled Meds: . aspirin EC  81 mg Oral Daily  . atorvastatin  40 mg Oral q1800  . carvedilol  6.25 mg Oral BID WC  . Chlorhexidine Gluconate Cloth  6 each Topical Daily  . folic acid  1 mg Oral Daily  . heparin  5,000 Units Subcutaneous q12n4p  . hydrocortisone cream  1 application Topical BID  . insulin aspart  0-9 Units Subcutaneous TID WC  . insulin glargine  5 Units Subcutaneous Daily  . pantoprazole  40 mg Oral Daily  . polyethylene glycol  17 g Oral Daily  . senna-docusate  2 tablet Oral Daily  . sodium chloride flush  3 mL Intravenous Q12H  . tamsulosin  0.8 mg Oral QHS    Continuous Infusions:  LOS: 1 day   Time spent: 23 minutes  Murad Staples Marry Guan, MD [facility] Pager [phone]  If 7PM-7AM, please contact night-coverage [email or web address] Password [facility] 07/29/2019, 9:13 AM

## 2019-07-29 NOTE — Progress Notes (Signed)
CRITICAL VALUE ALERT  Critical Value: cCBG 40 and 44 after orange juice.  Date & Time Notied: 07/29/2019 1100  Provider Notified: Dr Renaee Munda  Orders Received/Actions taken:

## 2019-07-30 LAB — BASIC METABOLIC PANEL
Anion gap: 6 (ref 5–15)
BUN: 28 mg/dL — ABNORMAL HIGH (ref 8–23)
CO2: 26 mmol/L (ref 22–32)
Calcium: 8.9 mg/dL (ref 8.9–10.3)
Chloride: 101 mmol/L (ref 98–111)
Creatinine, Ser: 1.79 mg/dL — ABNORMAL HIGH (ref 0.61–1.24)
GFR calc Af Amer: 39 mL/min — ABNORMAL LOW (ref >60)
GFR calc non Af Amer: 34 mL/min — ABNORMAL LOW (ref >60)
Glucose, Bld: 256 mg/dL — ABNORMAL HIGH (ref 70–99)
Potassium: 4.8 mmol/L (ref 3.5–5.1)
Sodium: 133 mmol/L — ABNORMAL LOW (ref 135–145)

## 2019-07-30 LAB — GLUCOSE, CAPILLARY
Glucose-Capillary: 105 mg/dL — ABNORMAL HIGH (ref 70–99)
Glucose-Capillary: 252 mg/dL — ABNORMAL HIGH (ref 70–99)
Glucose-Capillary: 126 mg/dL — ABNORMAL HIGH (ref 70–99)
Glucose-Capillary: 119 mg/dL — ABNORMAL HIGH (ref 70–99)

## 2019-07-30 NOTE — Progress Notes (Signed)
Occupational Therapy Treatment Patient Details Name: [name] MRN: [id number] DOB: [birth year] Today's Date: 07/30/2019    History of present illness 85 yo male admitted with rectal pain, AKI. Hx of CAD, CHF, DM, retinopathy, [name], BBB, carotid stenosis, BPH, ICD, PVD, TIA, ongoing rectal pain   OT comments  Pt ambulated to bathroom; min guard for safety and cues for safety when opening door as he kept only one hand on walker and one on door when backing up.  Min A for thoroughness with hygiene.    Follow Up Recommendations  Home health OT;Supervision - Intermittent, initial assistance for mobility during adls    Equipment Recommendations  None recommended by OT    Recommendations for Other Services      Precautions / Restrictions Precautions Precautions: Fall Precaution Comments: pt denies falls in past 1 year Restrictions Weight Bearing Restrictions: No       Mobility Bed Mobility by PT Overal bed mobility: Modified Independent Bed Mobility: Supine to Sit     Supine to sit: Modified independent (Device/Increase time);HOB elevated     General bed mobility comments: used bedrail, HOB up 25*  Transfers Overall transfer level: Needs assistance Equipment used: Rolling walker (2 wheeled) Transfers: Sit to/from Stand Sit to Stand: Supervision         General transfer comment: for safety    Balance Overall balance assessment: Modified Independent                                         ADL either performed or assessed with clinical judgement   ADL       Grooming: Wash/dry hands;Supervision/safety;Standing                   Toilet Transfer: Min guard;Ambulation;Comfort height toilet;RW   Toileting- Clothing Manipulation and Hygiene: Minimal assistance;Sit to/from stand         General ADL Comments: pt again incontinent x bowel. He states he wears depends at home. Min A for thoroughness with hygiene. Cues for safety with RW  when opening bath room door.  Pt uses sock aide at home:  mod A for LB dressing--assisted with changing today     Vision       Perception     Praxis      Cognition Arousal/Alertness: Awake/alert Behavior During Therapy: WFL for tasks assessed/performed Overall Cognitive Status: Within Functional Limits for tasks assessed                                          Exercises    Shoulder Instructions       General Comments      Pertinent Vitals/ Pain       Pain Assessment: Faces Faces Pain Scale: Hurts even more Pain Location: anus Pain Descriptors / Indicators: Sore Pain Intervention(s): Repositioned  Home Living                                          Prior Functioning/Environment              Frequency  Min 2X/week        Progress Toward Goals  OT Goals(current goals can now be  found in the care plan section)  Progress towards OT goals: Progressing toward goals  Acute Rehab OT Goals Patient Stated Goal: improved pain control  Plan      Co-evaluation                 AM-PAC OT "6 Clicks" Daily Activity     Outcome Measure   Help from another person eating meals?: A Little Help from another person taking care of personal grooming?: A Little Help from another person toileting, which includes using toliet, bedpan, or urinal?: A Little Help from another person bathing (including washing, rinsing, drying)?: A Little Help from another person to put on and taking off regular upper body clothing?: A Little Help from another person to put on and taking off regular lower body clothing?: A Lot 6 Click Score: 17    End of Session    OT Visit Diagnosis: Unsteadiness on feet (R26.81)   Activity Tolerance Patient tolerated treatment well   Patient Left in chair;with call bell/phone within reach;with chair alarm set   Nurse Communication          Time: 1450-1506 OT Time Calculation (min): 16 min  Charges:  OT General Charges $OT Visit: 1 Visit OT Treatments $Self Care/Home Management : 8-22 mins  Lesle Chris, OTR/L Acute Rehabilitation Services [phone] [facility] pager [phone] office 07/30/2019   Vaness Jelinski 07/30/2019, 4:32 PM

## 2019-07-30 NOTE — Progress Notes (Signed)
Physical Therapy Treatment Patient Details Name: [name] MRN: [id number] DOB: [birth year] Today's Date: 07/30/2019    History of Present Illness 85 yo male admitted with rectal pain, AKI. Hx of CAD, CHF, DM, retinopathy, [name], BBB, carotid stenosis, BPH, ICD, PVD, TIA, ongoing rectal pain    PT Comments    Pt is progressing well with mobility, he ambulated 180' with RW, no loss of balance. Pt lives alone, stated he has a son, sister, and neighbor that can assist him if needed. He denies falls in the past 1 year and stated he has a friend that has been assisting him with transportation to get groceries.    Follow Up Recommendations  Home health PT     Equipment Recommendations  None recommended by PT    Recommendations for Other Services       Precautions / Restrictions Precautions Precautions: Fall Precaution Comments: pt denies falls in past 1 year Restrictions Weight Bearing Restrictions: No    Mobility  Bed Mobility Overal bed mobility: Modified Independent Bed Mobility: Supine to Sit     Supine to sit: Modified independent (Device/Increase time);HOB elevated     General bed mobility comments: used bedrail, HOB up 25*  Transfers Overall transfer level: Needs assistance Equipment used: Rolling walker (2 wheeled) Transfers: Sit to/from Stand Sit to Stand: Supervision         General transfer comment: supervision for safety, no LOB  Ambulation/Gait Ambulation/Gait assistance: Supervision Gait Distance (Feet): 180 Feet Assistive device: Rolling walker (2 wheeled) Gait Pattern/deviations: WFL(Within Functional Limits) Gait velocity: WFL   General Gait Details: steady with RW, no loss of balance   Stairs             Wheelchair Mobility    Modified Rankin (Stroke Patients Only)       Balance Overall balance assessment: Modified Independent                                          Cognition Arousal/Alertness:  Awake/alert Behavior During Therapy: WFL for tasks assessed/performed Overall Cognitive Status: Within Functional Limits for tasks assessed                                        Exercises General Exercises - Upper Extremity Shoulder Flexion: AROM;Both;10 reps;Seated General Exercises - Lower Extremity Ankle Circles/Pumps: AROM;Both;10 reps;Seated Long Arc Quad: AROM;Both;10 reps;Seated Hip Flexion/Marching: AROM;Both;10 reps;Seated    General Comments        Pertinent Vitals/Pain Faces Pain Scale: Hurts even more Pain Location: anus Pain Descriptors / Indicators: Sore Pain Intervention(s): Other (comment)(cleaned up stool and applied hydrocortizone cream to rectal area)    Home Living                      Prior Function            PT Goals (current goals can now be found in the care plan section) Acute Rehab PT Goals Patient Stated Goal: improved pain control PT Goal Formulation: With patient Time For Goal Achievement: 08/11/19 Potential to Achieve Goals: Good Progress towards PT goals: Progressing toward goals    Frequency    Min 3X/week      PT Plan Current plan remains appropriate    Co-evaluation  AM-PAC PT "6 Clicks" Mobility   Outcome Measure  Help needed turning from your back to your side while in a flat bed without using bedrails?: A Little Help needed moving from lying on your back to sitting on the side of a flat bed without using bedrails?: A Little Help needed moving to and from a bed to a chair (including a wheelchair)?: None Help needed standing up from a chair using your arms (e.g., wheelchair or bedside chair)?: None Help needed to walk in hospital room?: None Help needed climbing 3-5 steps with a railing? : A Little 6 Click Score: 21    End of Session Equipment Utilized During Treatment: Gait belt Activity Tolerance: Patient tolerated treatment well Patient left: with call bell/phone within  reach;in chair Nurse Communication: Mobility status PT Visit Diagnosis: Pain;Other abnormalities of gait and mobility (R26.89) Pain - part of body: (rectum)     Time: 6378-5885 PT Time Calculation (min) (ACUTE ONLY): 19 min  Charges:  $Gait Training: 8-22 mins                     Blondell Reveal Kistler PT 07/30/2019  Acute Rehabilitation Services Pager [phone] Office [phone]

## 2019-07-30 NOTE — Progress Notes (Addendum)
PROGRESS NOTE  [name] NFA:[id number] DOB: 12-Mar-1931 DOA: 07/27/2019 PCP: Axel Filler, MD  Hospital Course/Subjective: [name] is a 85 y.o. male with medical history significant of CAD/ICMP, CHF s/p AICD,  Carotid disease s/p stent, Hx of nephrolithiasis, HTN, HLD, T2DM/IDDM c/b retinopathy and nephropathy, [name], and TIA who presents with ongoing rectal pain. Treating his acute on chronic renal failure and has had a bowel movement after aggressive bowel regimen and is feeling better today. Case was also discussed with urology, foley catheter in place and will remain there until outpatient urology follow up.  Patient continues to do well, will recheck renal function in the morning, will advance his diet to full liquid today, and if continues to do well may be discharged home soon.  # AKI -in the setting of [name] 3B # Chronic urinary retention # BPH # Mild bilateral hydroureteronephrosis - Creatinine has steadily risen 1.66 (8/31) -> 2.04 (10/27) -> 2.20 (11/2 - Admission), will recheck this afternoon. - patient presenting with rectal pain and noted to have large stool burden despite home bowel regimen. Now has had BM. - CT obtain (non-contrast) which revealed distended urinary bladder and severe prostatomegaly and large stool burden; patient also had mild bilateral hydroureteronephrosis which should be improving now that bladder has been decompressed with foley - admitting MD discussed with Urology, Dr. Claudia Desanctis, to discuss, but given known hx of BPH and chronic urinary retention, distended bladder and successful placement of foley catheter, would continue management now s/p bladder decompression, with foley and flomax given that level of pathology is the bladder.  Given his hx, she recommended maintaining foley catheter during hospitalization and would not remove prior to a week and they would perform voiding trial in clinic - out-patient follow-up with urology - also  anticipate improvement with management of constipation - monitor I/O - avoid nephrotoxic agents  # Constipation #  Possible Stercoral colitis - patient with ongoing constipation and now severe rectal pain; will trial aggressive bowel regimen, encourage mobilization and replete electrolytes accordingly - received a dose of Zosyn - admitting MD Discussed with Dr. Paulita Fujita [facility]) and concurred with aggressive bowel regimen; will request manual disimpaction as this would assist with enema.  Ordered for stool softener, laxative and SMOG enema.  After d/w Dr. Paulita Fujita and given lack of systemic symptoms, no fever or leukocytosis, will hold off on antibiotics for now.  -Patient now seems to be tolerating a clear liquid diet, will advance to full liquid today, nectar thick.  If he continues to do well can advance diet again in the morning and discharged from the hospital  # Abnormal imaging - plain film with gaseous distension (no UGI abnormality on CT), but no upper GI symptoms per patient tolerating PO over weekend - undewent SLP evaluation and is on appropriate diet at this time  # CAD/ICMP # Chronic systolic CHF s/p AICD - appears euvolemic and no cp - Echo EF 30-35 in April 2017 - continue aspirin, atorvastatin, carvedilol (changed home dose of 12.5 mg daily to 6.25 mg BID)  # IDDM/T2DM -Initially his dose of Lantus was reduced - ISS low dose  -Due to some hypoglycemia last evening, have discontinued Lantus  # Abnormal vocal vord - patient with concerning findings to R. Vocal Fold concerning for malignancy; planned for out-patient direct laryngoscopy with biopsy and esophagoscopy - patient follows with Dr. Melida Quitter, ENT  # Severe Protein Calorie Malnutrition - consulted Nutrition and SLP (for swallow eval and  dietary and supplement recommendations) - PT/OT  # HLD - continue atorvastatin  # HTN - continue carvedilol  DVT prophylaxis: SQH Code Status: Full Code  Family Communication: Patient reports son would be decision maker, [name] [phone]) Disposition Plan:  Likely discharge from the hospital in the next 24 hours.  Has been seen by physical therapy who recommends home health PT, the patient does live in an independent living facility. Admission status: inpatient    Objective: Vitals:   07/29/19 0433 07/29/19 1335 07/29/19 2121 07/30/19 0419  BP: 128/65 102/62 122/64 (!) 112/56  Pulse: 66 74 66 65  Resp: 12 18 14 14   Temp: 97.7 F (36.5 C)  97.7 F (36.5 C) 97.8 F (36.6 C)  TempSrc: Oral  Oral Oral  SpO2: 100% 99% 100% 100%  Weight: 52.8 kg     Height:        Intake/Output Summary (Last 24 hours) at 07/30/2019 1105 Last data filed at 07/30/2019 0755 Gross per 24 hour  Intake 1380 ml  Output 900 ml  Net 480 ml   Filed Weights   07/29/19 0342 07/29/19 0433  Weight: 50.9 kg 52.8 kg     Exam: General:  Alert, oriented, thin elderly male comfortable Eyes: EOMI, clear sclerea Neck: supple, no masses, trachea mildline  Cardiovascular: RRR, no murmurs or rubs, no peripheral edema  Respiratory: clear to auscultation bilaterally, no wheezes, no crackles  Abdomen: soft, nontender, nondistended, normal bowel tones heard  Skin: dry, no rashes  GU: foley in place with dark yellow urine Musculoskeletal: no joint effusions, normal range of motion  Psychiatric: appropriate affect, normal speech  Neurologic: extraocular muscles intact, clear speech, moving all extremities with intact sensorium    Data Reviewed: CBC: Recent Labs  Lab 07/28/19 0059 07/28/19 0115 07/29/19 0335  WBC 7.1  --  8.1  NEUTROABS 4.9  --   --   HGB 13.5 15.0 11.7*  HCT 43.9 44.0 37.7*  MCV 95.2  --  95.0  PLT 249  --  540   Basic Metabolic Panel: Recent Labs  Lab 07/28/19 0115 07/29/19 0335  NA 140 140  K 4.9 4.1  CL 105 107  CO2  --  26  GLUCOSE 133* 76  BUN 42* 39*  CREATININE 2.20* 2.22*  CALCIUM  --  9.4   GFR: Estimated Creatinine  Clearance: 17.8 mL/min (A) (by C-G formula based on SCr of 2.22 mg/dL (H)). Liver Function Tests: No results for input(s): AST, ALT, ALKPHOS, BILITOT, PROT, ALBUMIN in the last 168 hours. No results for input(s): LIPASE, AMYLASE in the last 168 hours. No results for input(s): AMMONIA in the last 168 hours. Coagulation Profile: No results for input(s): INR, PROTIME in the last 168 hours. Cardiac Enzymes: No results for input(s): CKTOTAL, CKMB, CKMBINDEX, TROPONINI in the last 168 hours. BNP (last 3 results) No results for input(s): PROBNP in the last 8760 hours. HbA1C: No results for input(s): HGBA1C in the last 72 hours. CBG: Recent Labs  Lab 07/29/19 1132 07/29/19 1631 07/29/19 2119 07/29/19 2222 07/30/19 0734  GLUCAP 115* 134* 64* 133* 105*   Lipid Profile: No results for input(s): CHOL, HDL, LDLCALC, TRIG, CHOLHDL, LDLDIRECT in the last 72 hours. Thyroid Function Tests: No results for input(s): TSH, T4TOTAL, FREET4, T3FREE, THYROIDAB in the last 72 hours. Anemia Panel: No results for input(s): VITAMINB12, FOLATE, FERRITIN, TIBC, IRON, RETICCTPCT in the last 72 hours. Urine analysis:    Component Value Date/Time   COLORURINE YELLOW 07/28/2019 0229   APPEARANCEUR  CLEAR 07/28/2019 0229   LABSPEC 1.012 07/28/2019 0229   PHURINE 5.0 07/28/2019 0229   GLUCOSEU NEGATIVE 07/28/2019 0229   GLUCOSEU 500 (A) 05/12/2009 2130   HGBUR NEGATIVE 07/28/2019 0229   BILIRUBINUR NEGATIVE 07/28/2019 0229   KETONESUR 5 (A) 07/28/2019 0229   PROTEINUR NEGATIVE 07/28/2019 0229   UROBILINOGEN 1.0 01/16/2015 1630   NITRITE NEGATIVE 07/28/2019 0229   LEUKOCYTESUR NEGATIVE 07/28/2019 0229   Sepsis Labs: @LABRCNTIP (procalcitonin:4,lacticidven:4)  ) Recent Results (from the past 240 hour(s))  SARS CORONAVIRUS 2 (TAT 6-24 HRS) Nasopharyngeal Nasopharyngeal Swab     Status: None   Collection Time: 07/24/19 10:16 AM   Specimen: Nasopharyngeal Swab  Result Value Ref Range Status   SARS  Coronavirus 2 NEGATIVE NEGATIVE Final    Comment: (NOTE) SARS-CoV-2 target nucleic acids are NOT DETECTED. The SARS-CoV-2 RNA is generally detectable in upper and lower respiratory specimens during the acute phase of infection. Negative results do not preclude SARS-CoV-2 infection, do not rule out co-infections with other pathogens, and should not be used as the sole basis for treatment or other patient management decisions. Negative results must be combined with clinical observations, patient history, and epidemiological information. The expected result is Negative. Fact Sheet for Patients: SugarRoll.be Fact Sheet for Healthcare Providers: https://www.woods-mathews.com/ This test is not yet approved or cleared by the [place] FDA and  has been authorized for detection and/or diagnosis of SARS-CoV-2 by FDA under an Emergency Use Authorization (EUA). This EUA will remain  in effect (meaning this test can be used) for the duration of the COVID-19 declaration under Section 56 4(b)(1) of the Act, 21 U.S.C. section 360bbb-3(b)(1), unless the authorization is terminated or revoked sooner. Performed at [facility], [place] [address] [place], [place] [address]   SARS CORONAVIRUS 2 (TAT 6-24 HRS) Nasopharyngeal Nasopharyngeal Swab     Status: None   Collection Time: 07/28/19  2:29 AM   Specimen: Nasopharyngeal Swab  Result Value Ref Range Status   SARS Coronavirus 2 NEGATIVE NEGATIVE Final    Comment: (NOTE) SARS-CoV-2 target nucleic acids are NOT DETECTED. The SARS-CoV-2 RNA is generally detectable in upper and lower respiratory specimens during the acute phase of infection. Negative results do not preclude SARS-CoV-2 infection, do not rule out co-infections with other pathogens, and should not be used as the sole basis for treatment or other patient management decisions. Negative results must be combined with clinical observations,  patient history, and epidemiological information. The expected result is Negative. Fact Sheet for Patients: SugarRoll.be Fact Sheet for Healthcare Providers: https://www.woods-mathews.com/ This test is not yet approved or cleared by the [place] FDA and  has been authorized for detection and/or diagnosis of SARS-CoV-2 by FDA under an Emergency Use Authorization (EUA). This EUA will remain  in effect (meaning this test can be used) for the duration of the COVID-19 declaration under Section 56 4(b)(1) of the Act, 21 U.S.C. section 360bbb-3(b)(1), unless the authorization is terminated or revoked sooner. Performed at [facility], [place] [address] [place], [place] [address]      Studies: No results found.  Scheduled Meds: . aspirin EC  81 mg Oral Daily  . atorvastatin  40 mg Oral q1800  . carvedilol  6.25 mg Oral BID WC  . Chlorhexidine Gluconate Cloth  6 each Topical Daily  . feeding supplement  1 Container Oral TID BM  . folic acid  1 mg Oral Daily  . heparin  5,000 Units Subcutaneous q12n4p  . hydrocortisone cream  1 application Topical BID  . insulin aspart  0-9 Units Subcutaneous TID WC  . pantoprazole  40 mg Oral Daily  . polyethylene glycol  17 g Oral Daily  . senna-docusate  2 tablet Oral Daily  . sodium chloride flush  3 mL Intravenous Q12H  . tamsulosin  0.8 mg Oral QHS    Continuous Infusions:   LOS: 2 days   Time spent: 23 minutes  Mir Marry Guan, MD [facility] Pager [phone]  If 7PM-7AM, please contact night-coverage [email or web address] Password [email or web address] 07/30/2019, 11:05 AM

## 2019-07-31 DIAGNOSIS — N179 Acute kidney failure, unspecified: Secondary | ICD-10-CM

## 2019-07-31 DIAGNOSIS — N400 Enlarged prostate without lower urinary tract symptoms: Secondary | ICD-10-CM

## 2019-07-31 DIAGNOSIS — K5900 Constipation, unspecified: Secondary | ICD-10-CM

## 2019-07-31 DIAGNOSIS — R338 Other retention of urine: Secondary | ICD-10-CM

## 2019-07-31 LAB — CBC
HCT: 34.4 % — ABNORMAL LOW (ref 39.0–52.0)
Hemoglobin: 10.8 g/dL — ABNORMAL LOW (ref 13.0–17.0)
MCH: 29.8 pg (ref 26.0–34.0)
MCHC: 31.4 g/dL (ref 30.0–36.0)
MCV: 94.8 fL (ref 80.0–100.0)
Platelets: 215 10*3/uL (ref 150–400)
RBC: 3.63 MIL/uL — ABNORMAL LOW (ref 4.22–5.81)
RDW: 15 % (ref 11.5–15.5)
WBC: 8.1 10*3/uL (ref 4.0–10.5)
nRBC: 0 % (ref 0.0–0.2)

## 2019-07-31 LAB — BASIC METABOLIC PANEL
Anion gap: 5 (ref 5–15)
BUN: 23 mg/dL (ref 8–23)
CO2: 25 mmol/L (ref 22–32)
Calcium: 8.6 mg/dL — ABNORMAL LOW (ref 8.9–10.3)
Chloride: 104 mmol/L (ref 98–111)
Creatinine, Ser: 1.54 mg/dL — ABNORMAL HIGH (ref 0.61–1.24)
GFR calc Af Amer: 47 mL/min — ABNORMAL LOW (ref >60)
GFR calc non Af Amer: 40 mL/min — ABNORMAL LOW (ref >60)
Glucose, Bld: 121 mg/dL — ABNORMAL HIGH (ref 70–99)
Potassium: 4.6 mmol/L (ref 3.5–5.1)
Sodium: 134 mmol/L — ABNORMAL LOW (ref 135–145)

## 2019-07-31 LAB — GLUCOSE, CAPILLARY
Glucose-Capillary: 148 mg/dL — ABNORMAL HIGH (ref 70–99)
Glucose-Capillary: 175 mg/dL — ABNORMAL HIGH (ref 70–99)

## 2019-07-31 MED ORDER — CARVEDILOL 6.25 MG PO TABS: 6.2500 mg | ORAL_TABLET | Freq: Two times a day (BID) | ORAL | 1 refills | Status: DC

## 2019-07-31 MED ORDER — POLYETHYLENE GLYCOL 3350 17 G PO PACK: 17.0000 g | PACK | Freq: Every day | ORAL | 0 refills | Status: DC

## 2019-07-31 MED ORDER — DULCOLAX 5 MG PO TBEC: 10.0000 mg | DELAYED_RELEASE_TABLET | Freq: Every day | ORAL | 1 refills | Status: DC

## 2019-07-31 NOTE — Discharge Summary (Addendum)
Physician Discharge Summary  JERRETT BALDINGER WCB:[id number] DOB: 07-Jul-1931 DOA: 07/27/2019  PCP: Axel Filler, MD  Admit date: 07/27/2019 Discharge date: 07/31/2019  Admitted From: Independent living Disposition: Independent living  Recommendations for Outpatient Follow-up:  1. Follow up with PCP in 1-2 weeks 2. Please obtain BMP/CBC in one week  Home Health PT Equipment/Devices: Foley catheter Discharge Condition: Stable and improved CODE STATUS full code  diet recommendation: Cardiac Brief/Interim Summary:85 y.o.malewith medical history significant ofCAD/ICMP, CHF s/p AICD, Carotid disease s/p stent, Hx of nephrolithiasis, HTN, HLD, T2DM/IDDM c/b retinopathy and nephropathy, [name], and TIA who presents with ongoing rectal pain. Treating his acute on chronic renal failure and has had a bowel movement after aggressive bowel regimen and is feeling better today. Case was also discussed with urology, foley catheter in place and will remain there until outpatient urology follow up.  Discharge Diagnoses:  Active Problems:   Hydronephrosis  # AKI -in the setting of [name] 3B #Chronic urinary retention # BPH # Mild bilateral hydroureteronephrosis - Creatinine has steadily risen 1.66 (8/31) -> 2.04 (10/27) -> 2.20 (11/2 - Admission) - patient presenting with rectal pain and noted to have large stool burden despite home bowel regimen he had bowel movements with aggressive bowel regime. - CT obtain (non-contrast) which revealed distended urinary bladder and severe prostatomegaly and large stool burden; patient also had mild bilateral hydroureteronephrosis which should be improving now that bladder has been decompressed with foley -admitting MD discussed withUrology, Dr. Claudia Desanctis, to discuss, but given known hx of BPH and chronic urinary retention, distended bladder and successful placement of foley catheter, would continue management now s/p bladder decompression, with foley and flomax  given that level of pathology is the bladder. Given his hx, she recommended maintaining foley catheter during hospitalization and would not remove prior to a week and they would perform voiding trial in clinic - out-patient follow-up with urology - also anticipate improvement with management of constipation -Creatinine on the day of discharge is 1.54.   # Constipation #PossibleStercoral colitis -admitting MD Discussed with Dr. Paulita Fujita [facility]) and concurred with aggressive bowel regimen; stool softener, laxative, and smog enema.  # Abnormal imaging - plain film with gaseous distension (no UGI abnormality on CT), but no upper GI symptoms per patient tolerating PO over weekend - undewent SLP evaluation and is on appropriate diet at this time  # CAD/ICMP # Chronic systolic CHF s/p AICD - appears euvolemic and no cp - Echo EF 30-35 in April 2017 - continue aspirin, atorvastatin, carvedilol (changed home dose of 12.5 mg daily to 6.25 mg BID)  # IDDM/T2DM continue Lantus  # Abnormal vocal vord - patient with concerning findings to R. Vocal Fold concerning for malignancy; planned for out-patient direct laryngoscopy with biopsy and esophagoscopy - patient follows with Dr. Melida Quitter, ENT  # Severe Protein Calorie Malnutrition dietary recommended boost breeze 3 times a day and Glucerna shake 3 times a day  # HLD - continue atorvastatin  # HTN - continue carvedilol and Lasix  #Generalized deconditioning and generalized weakness-patient seen by physical therapy recommending home PT.  Home PT to assist with gait training, transfer training, stair training.  Patient is unsafe ambulation due to balance issues.  And unable to leave home without assistance.  Pressure Ulcer 11/17/15 Stage II -  Partial thickness loss of dermis presenting as a shallow open ulcer with a red, pink wound bed without slough. Covered with Foam Mepilex (Active)  11/17/15 1110  Location: Sacrum  Location  Orientation:   Staging: Stage II -  Partial thickness loss of dermis presenting as a shallow open ulcer with a red, pink wound bed without slough.  Wound Description (Comments): Covered with Foam Mepilex  Present on Admission: Yes     Pressure Injury 04/22/17 Stage II -  Partial thickness loss of dermis presenting as a shallow open ulcer with a red, pink wound bed without slough. this is a partial thickness fissure in the gluteal cleft related to moisture (Active)  04/22/17 0302  Location: Sacrum  Location Orientation: Mid  Staging: Stage II -  Partial thickness loss of dermis presenting as a shallow open ulcer with a red, pink wound bed without slough.  Wound Description (Comments): this is a partial thickness fissure in the gluteal cleft related to moisture  Present on Admission: Yes      Nutrition Problem: Severe Malnutrition Etiology: chronic illness(DM, [name], CHF)    Signs/Symptoms: severe fat depletion, severe muscle depletion, percent weight loss Percent weight loss: 9 %(x 5 months)     Interventions: Boost Breeze  Estimated body mass index is 16.23 kg/m as calculated from the following:   Height as of this encounter: '5\' 11"'$  (1.803 m).   Weight as of this encounter: 52.8 kg.  Discharge Instructions  Discharge Instructions    Call MD for:  difficulty breathing, headache or visual disturbances   Complete by: As directed    Call MD for:  persistant nausea and vomiting   Complete by: As directed    Diet - low sodium heart healthy   Complete by: As directed    Increase activity slowly   Complete by: As directed      Allergies as of 07/31/2019   No Known Allergies     Medication List    TAKE these medications   Accu-Chek Aviva Plus w/Device Kit Check finger stick glucose once daily   alum & mag hydroxide-simeth [phone] MG/5ML suspension Commonly known as: MAALOX/MYLANTA Take 15 mLs by mouth every 6 (six) hours as needed for indigestion or heartburn.    aspirin EC 81 MG tablet Take 81 mg by mouth daily.   atorvastatin 40 MG tablet Commonly known as: LIPITOR TAKE 1 TABLET(40 MG) BY MOUTH DAILY AT 6 PM What changed:   how much to take  how to take this  when to take this  additional instructions   carvedilol 6.25 MG tablet Commonly known as: COREG Take 1 tablet (6.25 mg total) by mouth 2 (two) times daily with a meal. What changed:   medication strength  how much to take  when to take this   Dulcolax 5 MG EC tablet Generic drug: bisacodyl Take 2 tablets (10 mg total) by mouth daily.   furosemide 40 MG tablet Commonly known as: LASIX Take 1 tablet (40 mg total) by mouth 2 (two) times daily. What changed: how much to take   glucose blood test strip Commonly known as: Accu-Chek Aviva Plus Check blood sugar up to 3 times a day   Hydrocortisone (Perianal) 1 % Crea Commonly known as: Procto-Pak Apply 1 application topically 2 (two) times daily.   insulin glargine 100 UNIT/ML injection Commonly known as: LANTUS Inject 0.07 mLs (7 Units total) into the skin daily.   Insulin Pen Needle 31G X 8 MM Misc Commonly known as: B-D ULTRAFINE III SHORT PEN USE ONCE DAILY WITH LANTUS PEN   lidocaine 2 % jelly Commonly known as: XYLOCAINE Apply 1 application topically as needed. What changed: reasons to take  this   pantoprazole 40 MG tablet Commonly known as: PROTONIX TAKE 1 TABLET(40 MG) BY MOUTH DAILY What changed:   how much to take  how to take this  when to take this  additional instructions   polyethylene glycol 17 g packet Commonly known as: MIRALAX / GLYCOLAX Take 17 g by mouth daily. What changed:   when to take this  reasons to take this   senna-docusate 8.6-50 MG tablet Commonly known as: Senokot-S Take 2 tablets by mouth daily. What changed:   when to take this  reasons to take this   tamsulosin 0.4 MG Caps capsule Commonly known as: FLOMAX Take 0.8 mg by mouth at bedtime.    Ventolin HFA 108 (90 Base) MCG/ACT inhaler Generic drug: albuterol INHALE 2 PUFFS INTO THE LUNGS EVERY 6 HOURS AS NEEDED FOR WHEEZING OR SHORTNESS OF BREATH What changed: See the new instructions.      Follow-up Information    Axel Filler, MD Follow up.   Specialty: Internal Medicine Contact information: [place] [address] [phone]          No Known Allergies  Consultations: None  Procedures/Studies: Dg Abd Acute 2+v W 1v Chest  Result Date: 07/28/2019 CLINICAL DATA:  Constipation EXAM: DG ABDOMEN ACUTE W/ 1V CHEST COMPARISON:  CT 04/23/2017 FINDINGS: Left AICD in place.  Mild cardiomegaly.  Lungs clear.  No effusions. Mild diffuse gaseous distention of bowel could reflect ileus. No free air. No organomegaly or suspicious calcification. Degenerative changes in the lumbar spine and hips. No acute bony abnormality. IMPRESSION: Mild diffuse gaseous distention of bowel may reflect ileus. No acute cardiopulmonary disease. Electronically Signed   By: Rolm Baptise M.D.   On: 07/28/2019 00:11   Ct Renal Stone Study  Result Date: 07/28/2019 CLINICAL DATA:  Rectal pain EXAM: CT ABDOMEN AND PELVIS WITHOUT CONTRAST TECHNIQUE: Multidetector CT imaging of the abdomen and pelvis was performed following the standard protocol without IV contrast. COMPARISON:  April 23, 2017 FINDINGS: Lower chest: The lung bases are clear.The heart size is normal. There is a small pericardial effusion. Hepatobiliary: The liver is normal. Cholelithiasis without acute inflammation.There is no biliary ductal dilation. Pancreas: Normal contours without ductal dilatation. No peripancreatic fluid collection. Spleen: No splenic laceration or hematoma. Adrenals/Urinary Tract: --Adrenal glands: No adrenal hemorrhage. --Right kidney/ureter: There is mild right-sided hydroureteronephrosis. The right kidney is somewhat atrophic in comparison to the left. --Left kidney/ureter: There is mild  left-sided hydroureteronephrosis to the level of the urinary bladder. --Urinary bladder: The urinary bladder demonstrates significant distension and mild bladder wall thickening. Stomach/Bowel: --Stomach/Duodenum: No hiatal hernia or other gastric abnormality. Normal duodenal course and caliber. --Small bowel: No dilatation or inflammation. --Colon: There is a large amount of stool throughout the colon. There is a large amount of stool in the rectum with rectal wall thickening and mild adjacent fat stranding. This appearance is similar to prior study. --Appendix: Normal. Vascular/Lymphatic: Atherosclerotic calcification is present within the non-aneurysmal abdominal aorta, without hemodynamically significant stenosis. --No retroperitoneal lymphadenopathy. --No mesenteric lymphadenopathy. --No pelvic or inguinal lymphadenopathy. Reproductive: There is severe prostatomegaly. Other: No ascites or free air. The abdominal wall is normal. Musculoskeletal. No acute displaced fractures. IMPRESSION: 1. Mild bilateral hydroureteronephrosis that appears to be secondary to a distended urinary bladder. There is severe prostatomegaly likely causing bladder outlet obstruction. 2. Large amount of stool throughout the colon. There is rectal wall thickening which may be secondary to proctitis or stercoral colitis. This is  similar to prior study in 2018. 3.  Aortic Atherosclerosis (ICD10-I70.0). Electronically Signed   By: Constance Holster M.D.   On: 07/28/2019 02:08    (Echo, Carotid, EGD, Colonoscopy, ERCP)    Subjective:  Patient sitting up in chair Foley catheter in place had bowel movements overnight no new complaints today staff overnight has no reported issues  Foley draining clear urine Discharge Exam: Vitals:   07/30/19 2137 07/31/19 0414  BP: 116/60 126/61  Pulse: 70 70  Resp: 14 14  Temp: 98.8 F (37.1 C) 98.6 F (37 C)  SpO2: 100% 100%   Vitals:   07/30/19 0419 07/30/19 1321 07/30/19 2137 07/31/19  0414  BP: (!) 112/56 123/66 116/60 126/61  Pulse: 65 70 70 70  Resp: '14 16 14 14  '$ Temp: 97.8 F (36.6 C) 98.7 F (37.1 C) 98.8 F (37.1 C) 98.6 F (37 C)  TempSrc: Oral Oral Oral Oral  SpO2: 100% 100% 100% 100%  Weight:      Height:        General: Pt is alert, awake, not in acute distress Cardiovascular: RRR, S1/S2 +, no rubs, no gallops Respiratory: CTA bilaterally, no wheezing, no rhonchi Abdominal: Soft, NT, ND, bowel sounds + Extremities: no edema, no cyanosis    The results of significant diagnostics from this hospitalization (including imaging, microbiology, ancillary and laboratory) are listed below for reference.     Microbiology: Recent Results (from the past 240 hour(s))  SARS CORONAVIRUS 2 (TAT 6-24 HRS) Nasopharyngeal Nasopharyngeal Swab     Status: None   Collection Time: 07/24/19 10:16 AM   Specimen: Nasopharyngeal Swab  Result Value Ref Range Status   SARS Coronavirus 2 NEGATIVE NEGATIVE Final    Comment: (NOTE) SARS-CoV-2 target nucleic acids are NOT DETECTED. The SARS-CoV-2 RNA is generally detectable in upper and lower respiratory specimens during the acute phase of infection. Negative results do not preclude SARS-CoV-2 infection, do not rule out co-infections with other pathogens, and should not be used as the sole basis for treatment or other patient management decisions. Negative results must be combined with clinical observations, patient history, and epidemiological information. The expected result is Negative. Fact Sheet for Patients: SugarRoll.be Fact Sheet for Healthcare Providers: https://www.woods-.com/ This test is not yet approved or cleared by the [place] FDA and  has been authorized for detection and/or diagnosis of SARS-CoV-2 by FDA under an Emergency Use Authorization (EUA). This EUA will remain  in effect (meaning this test can be used) for the duration of the COVID-19  declaration under Section 56 4(b)(1) of the Act, 21 U.S.C. section 360bbb-3(b)(1), unless the authorization is terminated or revoked sooner. Performed at [facility], [place] [address], [place], [place] [address]   SARS CORONAVIRUS 2 (TAT 6-24 HRS) Nasopharyngeal Nasopharyngeal Swab     Status: None   Collection Time: 07/28/19  2:29 AM   Specimen: Nasopharyngeal Swab  Result Value Ref Range Status   SARS Coronavirus 2 NEGATIVE NEGATIVE Final    Comment: (NOTE) SARS-CoV-2 target nucleic acids are NOT DETECTED. The SARS-CoV-2 RNA is generally detectable in upper and lower respiratory specimens during the acute phase of infection. Negative results do not preclude SARS-CoV-2 infection, do not rule out co-infections with other pathogens, and should not be used as the sole basis for treatment or other patient management decisions. Negative results must be combined with clinical observations, patient history, and epidemiological information. The expected result is Negative. Fact Sheet for Patients: SugarRoll.be Fact Sheet for Healthcare Providers: https://www.woods-.com/  This test is not yet approved or cleared by the [place] and  has been authorized for detection and/or diagnosis of SARS-CoV-2 by FDA under an Emergency Use Authorization (EUA). This EUA will remain  in effect (meaning this test can be used) for the duration of the COVID-19 declaration under Section 56 4(b)(1) of the Act, 21 U.S.C. section 360bbb-3(b)(1), unless the authorization is terminated or revoked sooner. Performed at [facility], [place] [address] [place], [place] [address]      Labs: BNP (last 3 results) No results for input(s): BNP in the last 8760 hours. Basic Metabolic Panel: Recent Labs  Lab 07/28/19 0115 07/29/19 0335 07/30/19 1204 07/31/19 0342  NA 140 140 133* 134*  K 4.9 4.1 4.8 4.6  CL 105 107 101 104  CO2  --  '26 26 25   '$ GLUCOSE 133* 76 256* 121*  BUN 42* 39* 28* 23  CREATININE 2.20* 2.22* 1.79* 1.54*  CALCIUM  --  9.4 8.9 8.6*   Liver Function Tests: No results for input(s): AST, ALT, ALKPHOS, BILITOT, PROT, ALBUMIN in the last 168 hours. No results for input(s): LIPASE, AMYLASE in the last 168 hours. No results for input(s): AMMONIA in the last 168 hours. CBC: Recent Labs  Lab 07/28/19 0059 07/28/19 0115 07/29/19 0335 07/31/19 0342  WBC 7.1  --  8.1 8.1  NEUTROABS 4.9  --   --   --   HGB 13.5 15.0 11.7* 10.8*  HCT 43.9 44.0 37.7* 34.4*  MCV 95.2  --  95.0 94.8  PLT 249  --  271 215   Cardiac Enzymes: No results for input(s): CKTOTAL, CKMB, CKMBINDEX, TROPONINI in the last 168 hours. BNP: Invalid input(s): POCBNP CBG: Recent Labs  Lab 07/30/19 0734 07/30/19 1149 07/30/19 1709 07/30/19 2139 07/31/19 0741  GLUCAP 105* 252* 126* 119* 148*   D-Dimer No results for input(s): DDIMER in the last 72 hours. Hgb A1c No results for input(s): HGBA1C in the last 72 hours. Lipid Profile No results for input(s): CHOL, HDL, LDLCALC, TRIG, CHOLHDL, LDLDIRECT in the last 72 hours. Thyroid function studies No results for input(s): TSH, T4TOTAL, T3FREE, THYROIDAB in the last 72 hours.  Invalid input(s): FREET3 Anemia work up No results for input(s): VITAMINB12, FOLATE, FERRITIN, TIBC, IRON, RETICCTPCT in the last 72 hours. Urinalysis    Component Value Date/Time   COLORURINE YELLOW 07/28/2019 0229   APPEARANCEUR CLEAR 07/28/2019 0229   LABSPEC 1.012 07/28/2019 0229   PHURINE 5.0 07/28/2019 0229   GLUCOSEU NEGATIVE 07/28/2019 0229   GLUCOSEU 500 (A) 05/12/2009 2130   HGBUR NEGATIVE 07/28/2019 0229   BILIRUBINUR NEGATIVE 07/28/2019 0229   KETONESUR 5 (A) 07/28/2019 0229   PROTEINUR NEGATIVE 07/28/2019 0229   UROBILINOGEN 1.0 01/16/2015 1630   NITRITE NEGATIVE 07/28/2019 0229   LEUKOCYTESUR NEGATIVE 07/28/2019 0229   Sepsis Labs Invalid input(s): PROCALCITONIN,  WBC,   LACTICIDVEN Microbiology Recent Results (from the past 240 hour(s))  SARS CORONAVIRUS 2 (TAT 6-24 HRS) Nasopharyngeal Nasopharyngeal Swab     Status: None   Collection Time: 07/24/19 10:16 AM   Specimen: Nasopharyngeal Swab  Result Value Ref Range Status   SARS Coronavirus 2 NEGATIVE NEGATIVE Final    Comment: (NOTE) SARS-CoV-2 target nucleic acids are NOT DETECTED. The SARS-CoV-2 RNA is generally detectable in upper and lower respiratory specimens during the acute phase of infection. Negative results do not preclude SARS-CoV-2 infection, do not rule out co-infections with other pathogens, and should not be used as the sole basis  for treatment or other patient management decisions. Negative results must be combined with clinical observations, patient history, and epidemiological information. The expected result is Negative. Fact Sheet for Patients: SugarRoll.be Fact Sheet for Healthcare Providers: https://www.woods-[employer].com/ This test is not yet approved or cleared by the [place] FDA and  has been authorized for detection and/or diagnosis of SARS-CoV-2 by FDA under an Emergency Use Authorization (EUA). This EUA will remain  in effect (meaning this test can be used) for the duration of the COVID-19 declaration under Section 56 4(b)(1) of the Act, 21 U.S.C. section 360bbb-3(b)(1), unless the authorization is terminated or revoked sooner. Performed at [facility], [place] [address] [place], [place] [address]   SARS CORONAVIRUS 2 (TAT 6-24 HRS) Nasopharyngeal Nasopharyngeal Swab     Status: None   Collection Time: 07/28/19  2:29 AM   Specimen: Nasopharyngeal Swab  Result Value Ref Range Status   SARS Coronavirus 2 NEGATIVE NEGATIVE Final    Comment: (NOTE) SARS-CoV-2 target nucleic acids are NOT DETECTED. The SARS-CoV-2 RNA is generally detectable in upper and lower respiratory specimens during the acute phase of infection.  Negative results do not preclude SARS-CoV-2 infection, do not rule out co-infections with other pathogens, and should not be used as the sole basis for treatment or other patient management decisions. Negative results must be combined with clinical observations, patient history, and epidemiological information. The expected result is Negative. Fact Sheet for Patients: SugarRoll.be Fact Sheet for Healthcare Providers: https://www.woods-[employer]rakash [employer].com/ This test is not yet approved or cleared by the [place] FDA and  has been authorized for detection and/or diagnosis of SARS-CoV-2 by FDA under an Emergency Use Authorization (EUA). This EUA will remain  in effect (meaning this test can be used) for the duration of the COVID-19 declaration under Section 56 4(b)(1) of the Act, 21 U.S.C. section 360bbb-3(b)(1), unless the authorization is terminated or revoked sooner. Performed at [facility], [place] [address], [place],  [address]      Time coordinating discharge:  39 minutes  SIGNED:   Georgette Shell, MD  [facility] 07/31/2019, 9:34 AM Pager   If 7PM-7AM, please contact night-coverage [email or web address] Password [email or web address]

## 2019-07-31 NOTE — TOC Progression Note (Addendum)
Transition of Care [facility]) - Progression Note    Patient Details  Name: [name] MRN: [id number] Date of Birth: [birth year]  [facility] CM/SW Contact  Leeroy Cha, RN Phone Number: 07/31/2019, 10:55 AM  Clinical Narrative:    [facility]c orders sent to [facility] for consideration tcf-Karen with [facility]-will do hhc.        Expected Discharge Plan and Services           Expected Discharge Date: 07/31/19                                     Social Determinants of Health (SDOH) Interventions    Readmission Risk Interventions No flowsheet data found.

## 2019-07-31 NOTE — Progress Notes (Signed)
Discharge instructions given to patient. Patient had no questions. Patient sister will be here at 1pm to pick him up. NT or writer will wheel patient out once family is here

## 2019-07-31 NOTE — Care Management Important Message (Signed)
Important Message  Patient Details IM Letter given to Velva Harman RN to present to the Patient Name: [name] MRN: [id number] Date of Birth: [birth year]   Medicare Important Message Given:  Yes     Kerin Salen 07/31/2019, 12:40 PM

## 2019-08-01 DIAGNOSIS — I252 Old myocardial infarction: Secondary | ICD-10-CM | POA: Diagnosis not present

## 2019-08-01 DIAGNOSIS — R1310 Dysphagia, unspecified: Secondary | ICD-10-CM | POA: Diagnosis not present

## 2019-08-01 DIAGNOSIS — I2510 Atherosclerotic heart disease of native coronary artery without angina pectoris: Secondary | ICD-10-CM | POA: Diagnosis not present

## 2019-08-01 DIAGNOSIS — E1122 Type 2 diabetes mellitus with diabetic chronic kidney disease: Secondary | ICD-10-CM | POA: Diagnosis not present

## 2019-08-01 DIAGNOSIS — K5900 Constipation, unspecified: Secondary | ICD-10-CM | POA: Diagnosis not present

## 2019-08-01 DIAGNOSIS — I130 Hypertensive heart and chronic kidney disease with heart failure and stage 1 through stage 4 chronic kidney disease, or unspecified chronic kidney disease: Secondary | ICD-10-CM | POA: Diagnosis not present

## 2019-08-01 DIAGNOSIS — N183 Chronic kidney disease, stage 3 unspecified: Secondary | ICD-10-CM | POA: Diagnosis not present

## 2019-08-01 DIAGNOSIS — I5022 Chronic systolic (congestive) heart failure: Secondary | ICD-10-CM | POA: Diagnosis not present

## 2019-08-01 DIAGNOSIS — K6289 Other specified diseases of anus and rectum: Secondary | ICD-10-CM | POA: Diagnosis not present

## 2019-08-04 ENCOUNTER — Telehealth: Payer: Self-pay

## 2019-08-04 NOTE — Telephone Encounter (Signed)
Agree 

## 2019-08-04 NOTE — Telephone Encounter (Signed)
Cecelia with [facility] called and is requesting VO for PT to assist with gait training, transfer training/stair training, ambulation and balance: 1X for 1 week 2X for 2 weeks  VO given.  Will forward to PCP for agreement or denial. Thank you, SChaplin, RN,BSN

## 2019-08-05 DIAGNOSIS — I130 Hypertensive heart and chronic kidney disease with heart failure and stage 1 through stage 4 chronic kidney disease, or unspecified chronic kidney disease: Secondary | ICD-10-CM | POA: Diagnosis not present

## 2019-08-05 DIAGNOSIS — K5900 Constipation, unspecified: Secondary | ICD-10-CM | POA: Diagnosis not present

## 2019-08-05 DIAGNOSIS — N183 Chronic kidney disease, stage 3 unspecified: Secondary | ICD-10-CM | POA: Diagnosis not present

## 2019-08-05 DIAGNOSIS — I252 Old myocardial infarction: Secondary | ICD-10-CM | POA: Diagnosis not present

## 2019-08-05 DIAGNOSIS — I5022 Chronic systolic (congestive) heart failure: Secondary | ICD-10-CM | POA: Diagnosis not present

## 2019-08-05 DIAGNOSIS — R1310 Dysphagia, unspecified: Secondary | ICD-10-CM | POA: Diagnosis not present

## 2019-08-05 DIAGNOSIS — K6289 Other specified diseases of anus and rectum: Secondary | ICD-10-CM | POA: Diagnosis not present

## 2019-08-05 DIAGNOSIS — I2510 Atherosclerotic heart disease of native coronary artery without angina pectoris: Secondary | ICD-10-CM | POA: Diagnosis not present

## 2019-08-05 DIAGNOSIS — E1122 Type 2 diabetes mellitus with diabetic chronic kidney disease: Secondary | ICD-10-CM | POA: Diagnosis not present

## 2019-08-06 ENCOUNTER — Encounter: Payer: Medicare HMO | Admitting: *Deleted

## 2019-08-07 ENCOUNTER — Telehealth: Payer: Self-pay

## 2019-08-07 DIAGNOSIS — I5022 Chronic systolic (congestive) heart failure: Secondary | ICD-10-CM | POA: Diagnosis not present

## 2019-08-07 DIAGNOSIS — N183 Chronic kidney disease, stage 3 unspecified: Secondary | ICD-10-CM | POA: Diagnosis not present

## 2019-08-07 DIAGNOSIS — K5900 Constipation, unspecified: Secondary | ICD-10-CM | POA: Diagnosis not present

## 2019-08-07 DIAGNOSIS — I2510 Atherosclerotic heart disease of native coronary artery without angina pectoris: Secondary | ICD-10-CM | POA: Diagnosis not present

## 2019-08-07 DIAGNOSIS — E1122 Type 2 diabetes mellitus with diabetic chronic kidney disease: Secondary | ICD-10-CM | POA: Diagnosis not present

## 2019-08-07 DIAGNOSIS — R1310 Dysphagia, unspecified: Secondary | ICD-10-CM | POA: Diagnosis not present

## 2019-08-07 DIAGNOSIS — I252 Old myocardial infarction: Secondary | ICD-10-CM | POA: Diagnosis not present

## 2019-08-07 DIAGNOSIS — I130 Hypertensive heart and chronic kidney disease with heart failure and stage 1 through stage 4 chronic kidney disease, or unspecified chronic kidney disease: Secondary | ICD-10-CM | POA: Diagnosis not present

## 2019-08-07 DIAGNOSIS — K6289 Other specified diseases of anus and rectum: Secondary | ICD-10-CM | POA: Diagnosis not present

## 2019-08-07 NOTE — Telephone Encounter (Signed)
Left message for patient to remind of missed remote transmission. LL 

## 2019-08-08 ENCOUNTER — Telehealth: Payer: Self-pay

## 2019-08-08 DIAGNOSIS — I130 Hypertensive heart and chronic kidney disease with heart failure and stage 1 through stage 4 chronic kidney disease, or unspecified chronic kidney disease: Secondary | ICD-10-CM | POA: Diagnosis not present

## 2019-08-08 DIAGNOSIS — I509 Heart failure, unspecified: Secondary | ICD-10-CM | POA: Diagnosis not present

## 2019-08-08 DIAGNOSIS — N183 Chronic kidney disease, stage 3 unspecified: Secondary | ICD-10-CM | POA: Diagnosis not present

## 2019-08-08 DIAGNOSIS — E1122 Type 2 diabetes mellitus with diabetic chronic kidney disease: Secondary | ICD-10-CM | POA: Diagnosis not present

## 2019-08-08 DIAGNOSIS — R338 Other retention of urine: Secondary | ICD-10-CM | POA: Diagnosis not present

## 2019-08-08 DIAGNOSIS — E11319 Type 2 diabetes mellitus with unspecified diabetic retinopathy without macular edema: Secondary | ICD-10-CM | POA: Diagnosis not present

## 2019-08-08 DIAGNOSIS — S81812D Laceration without foreign body, left lower leg, subsequent encounter: Secondary | ICD-10-CM | POA: Diagnosis not present

## 2019-08-08 DIAGNOSIS — R1312 Dysphagia, oropharyngeal phase: Secondary | ICD-10-CM | POA: Diagnosis not present

## 2019-08-08 DIAGNOSIS — N401 Enlarged prostate with lower urinary tract symptoms: Secondary | ICD-10-CM | POA: Diagnosis not present

## 2019-08-08 NOTE — Telephone Encounter (Signed)
I agree with the home care nursing and foley care. I would want him evaluated before prescribing an antibiotic. [name] is fragile when it comes to medication changes. It can also be tricky figuring out if someone has a catheter associated infection, or just irritation.

## 2019-08-08 NOTE — Telephone Encounter (Signed)
Received TC from Vicente Males a physical therapist from [place].  States she is with the patient now for PT and noted the pt has an indwelling urinary catheter.  PT states urine looks cloudy and pt c/o "burning with urination".  Vicente Males is requesting an ABX for a UTI, states she is unable to collect urine specimen since she is not a [employer] scientist.  VO given for home care nursing assessment and/or foley catheter care.   Will forward to PCP for agreement or denial.  SChaplin, RN,BSN

## 2019-08-11 DIAGNOSIS — E11319 Type 2 diabetes mellitus with unspecified diabetic retinopathy without macular edema: Secondary | ICD-10-CM | POA: Diagnosis not present

## 2019-08-11 DIAGNOSIS — N183 Chronic kidney disease, stage 3 unspecified: Secondary | ICD-10-CM | POA: Diagnosis not present

## 2019-08-11 DIAGNOSIS — R1312 Dysphagia, oropharyngeal phase: Secondary | ICD-10-CM | POA: Diagnosis not present

## 2019-08-11 DIAGNOSIS — R338 Other retention of urine: Secondary | ICD-10-CM | POA: Diagnosis not present

## 2019-08-11 DIAGNOSIS — I130 Hypertensive heart and chronic kidney disease with heart failure and stage 1 through stage 4 chronic kidney disease, or unspecified chronic kidney disease: Secondary | ICD-10-CM | POA: Diagnosis not present

## 2019-08-11 DIAGNOSIS — N401 Enlarged prostate with lower urinary tract symptoms: Secondary | ICD-10-CM | POA: Diagnosis not present

## 2019-08-11 DIAGNOSIS — I509 Heart failure, unspecified: Secondary | ICD-10-CM | POA: Diagnosis not present

## 2019-08-11 DIAGNOSIS — S81812D Laceration without foreign body, left lower leg, subsequent encounter: Secondary | ICD-10-CM | POA: Diagnosis not present

## 2019-08-11 DIAGNOSIS — E1122 Type 2 diabetes mellitus with diabetic chronic kidney disease: Secondary | ICD-10-CM | POA: Diagnosis not present

## 2019-08-12 ENCOUNTER — Telehealth: Payer: Self-pay | Admitting: Student in an Organized Health Care Education/Training Program

## 2019-08-12 ENCOUNTER — Other Ambulatory Visit (HOSPITAL_COMMUNITY)
Admission: RE | Admit: 2019-08-12 | Discharge: 2019-08-12 | Disposition: A | Discharge status: Home / Self Care | Payer: Medicare HMO | Source: Ambulatory Visit | Attending: Otolaryngology | Admitting: Otolaryngology

## 2019-08-12 DIAGNOSIS — N183 Chronic kidney disease, stage 3 unspecified: Secondary | ICD-10-CM | POA: Diagnosis not present

## 2019-08-12 DIAGNOSIS — I509 Heart failure, unspecified: Secondary | ICD-10-CM | POA: Diagnosis not present

## 2019-08-12 DIAGNOSIS — I130 Hypertensive heart and chronic kidney disease with heart failure and stage 1 through stage 4 chronic kidney disease, or unspecified chronic kidney disease: Secondary | ICD-10-CM | POA: Diagnosis not present

## 2019-08-12 DIAGNOSIS — E11319 Type 2 diabetes mellitus with unspecified diabetic retinopathy without macular edema: Secondary | ICD-10-CM | POA: Diagnosis not present

## 2019-08-12 DIAGNOSIS — Z01812 Encounter for preprocedural laboratory examination: Secondary | ICD-10-CM | POA: Insufficient documentation

## 2019-08-12 DIAGNOSIS — S81812D Laceration without foreign body, left lower leg, subsequent encounter: Secondary | ICD-10-CM | POA: Diagnosis not present

## 2019-08-12 DIAGNOSIS — R1312 Dysphagia, oropharyngeal phase: Secondary | ICD-10-CM | POA: Diagnosis not present

## 2019-08-12 DIAGNOSIS — R338 Other retention of urine: Secondary | ICD-10-CM | POA: Diagnosis not present

## 2019-08-12 DIAGNOSIS — Z20828 Contact with and (suspected) exposure to other viral communicable diseases: Secondary | ICD-10-CM | POA: Diagnosis not present

## 2019-08-12 DIAGNOSIS — N401 Enlarged prostate with lower urinary tract symptoms: Secondary | ICD-10-CM | POA: Diagnosis not present

## 2019-08-12 DIAGNOSIS — E1122 Type 2 diabetes mellitus with diabetic chronic kidney disease: Secondary | ICD-10-CM | POA: Diagnosis not present

## 2019-08-12 NOTE — Telephone Encounter (Signed)
Yes, UA and culture please given symptoms of dysuria. Can you also check if he still has a foley catheter in place please? Is he going to see urology soon for a voiding trial?

## 2019-08-12 NOTE — Telephone Encounter (Signed)
[facility] nurse RN Martin Majestic, calling to report patient has burning with urination, cloudy urine with a temperature of 99.1.  Please call back.

## 2019-08-12 NOTE — Telephone Encounter (Signed)
[name] returned call. She will obtain U/A and culture tomorrow. States patient does have foley catheter and she thinks pt has appt with urology either 11/19 or 08/15/2019. Hubbard Hartshorn, BSN, RN-BC

## 2019-08-12 NOTE — Telephone Encounter (Addendum)
Returned call to [place]. No answer. Left message on VM requesting return call. Hubbard Hartshorn, RN, BSN

## 2019-08-12 NOTE — Progress Notes (Signed)
No ICM remote transmission received for 08/06/2019 and next ICM transmission scheduled for 09/01/2019.

## 2019-08-13 ENCOUNTER — Telehealth: Payer: Self-pay | Admitting: *Deleted

## 2019-08-13 DIAGNOSIS — I130 Hypertensive heart and chronic kidney disease with heart failure and stage 1 through stage 4 chronic kidney disease, or unspecified chronic kidney disease: Secondary | ICD-10-CM | POA: Diagnosis not present

## 2019-08-13 DIAGNOSIS — E11319 Type 2 diabetes mellitus with unspecified diabetic retinopathy without macular edema: Secondary | ICD-10-CM | POA: Diagnosis not present

## 2019-08-13 DIAGNOSIS — R1312 Dysphagia, oropharyngeal phase: Secondary | ICD-10-CM | POA: Diagnosis not present

## 2019-08-13 DIAGNOSIS — E1122 Type 2 diabetes mellitus with diabetic chronic kidney disease: Secondary | ICD-10-CM | POA: Diagnosis not present

## 2019-08-13 DIAGNOSIS — N183 Chronic kidney disease, stage 3 unspecified: Secondary | ICD-10-CM | POA: Diagnosis not present

## 2019-08-13 DIAGNOSIS — I509 Heart failure, unspecified: Secondary | ICD-10-CM | POA: Diagnosis not present

## 2019-08-13 DIAGNOSIS — S81812D Laceration without foreign body, left lower leg, subsequent encounter: Secondary | ICD-10-CM | POA: Diagnosis not present

## 2019-08-13 DIAGNOSIS — R338 Other retention of urine: Secondary | ICD-10-CM | POA: Diagnosis not present

## 2019-08-13 DIAGNOSIS — N401 Enlarged prostate with lower urinary tract symptoms: Secondary | ICD-10-CM | POA: Diagnosis not present

## 2019-08-13 LAB — NOVEL CORONAVIRUS, NAA (HOSP ORDER, SEND-OUT TO REF LAB; TAT 18-24 HRS): SARS-CoV-2, NAA: NOT DETECTED

## 2019-08-13 NOTE — Telephone Encounter (Signed)
Received fax request from [facility] for PCS and FL2. Patient had telephone visit with Dr. Shan Levans on 06/18/2019 where need for PCS was discussed. Have placed PCS form in Dr. Maudie Flakes box for completion and signature. Will Place FL2 form in PCP's box for completion. Hubbard Hartshorn, BSN, RN-BC

## 2019-08-13 NOTE — Telephone Encounter (Signed)
FL2 form completed by PCP. There is no return fax number to send this to. Call placed to [place], Amy. No answer and VM is full. Unable to leave message. Hubbard Hartshorn, BSN, RN-BC

## 2019-08-14 ENCOUNTER — Encounter: Payer: Self-pay | Admitting: Internal Medicine

## 2019-08-14 DIAGNOSIS — I130 Hypertensive heart and chronic kidney disease with heart failure and stage 1 through stage 4 chronic kidney disease, or unspecified chronic kidney disease: Secondary | ICD-10-CM | POA: Diagnosis not present

## 2019-08-14 DIAGNOSIS — I509 Heart failure, unspecified: Secondary | ICD-10-CM | POA: Diagnosis not present

## 2019-08-14 DIAGNOSIS — E11319 Type 2 diabetes mellitus with unspecified diabetic retinopathy without macular edema: Secondary | ICD-10-CM | POA: Diagnosis not present

## 2019-08-14 DIAGNOSIS — R972 Elevated prostate specific antigen [PSA]: Secondary | ICD-10-CM | POA: Diagnosis not present

## 2019-08-14 DIAGNOSIS — R1312 Dysphagia, oropharyngeal phase: Secondary | ICD-10-CM | POA: Diagnosis not present

## 2019-08-14 DIAGNOSIS — R338 Other retention of urine: Secondary | ICD-10-CM | POA: Diagnosis not present

## 2019-08-14 DIAGNOSIS — N472 Paraphimosis: Secondary | ICD-10-CM | POA: Diagnosis not present

## 2019-08-14 DIAGNOSIS — N401 Enlarged prostate with lower urinary tract symptoms: Secondary | ICD-10-CM | POA: Diagnosis not present

## 2019-08-14 DIAGNOSIS — N183 Chronic kidney disease, stage 3 unspecified: Secondary | ICD-10-CM | POA: Diagnosis not present

## 2019-08-14 DIAGNOSIS — N130 Hydronephrosis with ureteropelvic junction obstruction: Secondary | ICD-10-CM | POA: Diagnosis not present

## 2019-08-14 DIAGNOSIS — E1122 Type 2 diabetes mellitus with diabetic chronic kidney disease: Secondary | ICD-10-CM | POA: Diagnosis not present

## 2019-08-14 DIAGNOSIS — S81812D Laceration without foreign body, left lower leg, subsequent encounter: Secondary | ICD-10-CM | POA: Diagnosis not present

## 2019-08-14 NOTE — Progress Notes (Signed)
I was unable to reach patient by phone.  I called the contacts that are listed that we can speak to, I called Ms Jimmye Norman is patient's sister- no answer x 2., I left a message to call me. I called girl friend x 2 - could not accept call. I called patient's brother, orlo, brickle said he would try to get another from their sister, Ms Jimmye Norman, I informed him that I  had left  said he would try to get another from his sister, I called x 2 I left a message on voice mail.   Mr jd mccaster, he could not reach anyone either.  I left Mr Eleno Weimar arrive at [place] entrance at --0730  , register in the [place].  I instructed patient, if you take Insulin to take  1/2 dose 3 units tonight or in am, if that is when you take it-if CBG > that 70. I instructed patient to check CBG after awaking and every 2 hours until arrival  to the hospital.  I Instructed patient if CBG is less than 70 to drink1/2 cup of a clear juice. Recheck CBG in 15 minutes then call pre- op desk at [phone] for further instructions. If scheduled to receive Insulin, do not take Insulin  DO NOT eat or drink anything after midnight.  I instructed the patient to take the following medications in the am with just enough water to get them down:  I asked patient to not wear any lotions, powders, cologne, jewelry, piercing, make-up or nail polish.  Wear clean clothes. Brush teeth. I informed patient that there will need to be a driver and someone to stay with him/her for the first 24 hours after surgery.   I instructed  patient to call [phone], in the am if there were any questions or problems.  I instructed Mr Colglazier via voice message if he has any questions or does not under stand information to call [phone], failure to follow instructions could result in surgery being cancelled.

## 2019-08-14 NOTE — Telephone Encounter (Signed)
Completed and signed Request for Independent Assessment for [place] of Medical Need faxed to [place] at [phone].  Karie Skowron L [place]11/19/20201:27 PM

## 2019-08-15 ENCOUNTER — Ambulatory Visit (HOSPITAL_COMMUNITY): Payer: Medicare HMO | Admitting: Certified Registered Nurse Anesthetist

## 2019-08-15 ENCOUNTER — Encounter (HOSPITAL_COMMUNITY)
Admission: RE | Disposition: A | Discharge status: Home / Self Care | Payer: Self-pay | Source: Home / Self Care | Attending: Otolaryngology

## 2019-08-15 ENCOUNTER — Ambulatory Visit (HOSPITAL_COMMUNITY)
Admission: RE | Admit: 2019-08-15 | Discharge: 2019-08-15 | Disposition: A | Discharge status: Home / Self Care | Payer: Medicare HMO | Attending: Otolaryngology | Admitting: Otolaryngology

## 2019-08-15 ENCOUNTER — Other Ambulatory Visit: Payer: Self-pay

## 2019-08-15 ENCOUNTER — Encounter (HOSPITAL_COMMUNITY): Payer: Self-pay

## 2019-08-15 DIAGNOSIS — I5022 Chronic systolic (congestive) heart failure: Secondary | ICD-10-CM | POA: Insufficient documentation

## 2019-08-15 DIAGNOSIS — Z794 Long term (current) use of insulin: Secondary | ICD-10-CM | POA: Diagnosis not present

## 2019-08-15 DIAGNOSIS — Z95810 Presence of automatic (implantable) cardiac defibrillator: Secondary | ICD-10-CM | POA: Insufficient documentation

## 2019-08-15 DIAGNOSIS — Z8673 Personal history of transient ischemic attack (TIA), and cerebral infarction without residual deficits: Secondary | ICD-10-CM | POA: Diagnosis not present

## 2019-08-15 DIAGNOSIS — E1122 Type 2 diabetes mellitus with diabetic chronic kidney disease: Secondary | ICD-10-CM | POA: Diagnosis not present

## 2019-08-15 DIAGNOSIS — Z7982 Long term (current) use of aspirin: Secondary | ICD-10-CM | POA: Diagnosis not present

## 2019-08-15 DIAGNOSIS — I2510 Atherosclerotic heart disease of native coronary artery without angina pectoris: Secondary | ICD-10-CM | POA: Insufficient documentation

## 2019-08-15 DIAGNOSIS — R490 Dysphonia: Secondary | ICD-10-CM | POA: Diagnosis present

## 2019-08-15 DIAGNOSIS — N189 Chronic kidney disease, unspecified: Secondary | ICD-10-CM | POA: Insufficient documentation

## 2019-08-15 DIAGNOSIS — I130 Hypertensive heart and chronic kidney disease with heart failure and stage 1 through stage 4 chronic kidney disease, or unspecified chronic kidney disease: Secondary | ICD-10-CM | POA: Insufficient documentation

## 2019-08-15 DIAGNOSIS — C329 Malignant neoplasm of larynx, unspecified: Secondary | ICD-10-CM | POA: Diagnosis not present

## 2019-08-15 DIAGNOSIS — C320 Malignant neoplasm of glottis: Secondary | ICD-10-CM | POA: Diagnosis not present

## 2019-08-15 DIAGNOSIS — E785 Hyperlipidemia, unspecified: Secondary | ICD-10-CM | POA: Diagnosis not present

## 2019-08-15 DIAGNOSIS — Z79899 Other long term (current) drug therapy: Secondary | ICD-10-CM | POA: Diagnosis not present

## 2019-08-15 DIAGNOSIS — Z955 Presence of coronary angioplasty implant and graft: Secondary | ICD-10-CM | POA: Insufficient documentation

## 2019-08-15 DIAGNOSIS — I252 Old myocardial infarction: Secondary | ICD-10-CM | POA: Diagnosis not present

## 2019-08-15 DIAGNOSIS — N183 Chronic kidney disease, stage 3 unspecified: Secondary | ICD-10-CM | POA: Diagnosis not present

## 2019-08-15 HISTORY — PX: DIRECT LARYNGOSCOPY: SHX5326

## 2019-08-15 HISTORY — PX: RIGID ESOPHAGOSCOPY: SHX5226

## 2019-08-15 LAB — BASIC METABOLIC PANEL
Anion gap: 11 (ref 5–15)
BUN: 37 mg/dL — ABNORMAL HIGH (ref 8–23)
CO2: 25 mmol/L (ref 22–32)
Calcium: 9.7 mg/dL (ref 8.9–10.3)
Chloride: 104 mmol/L (ref 98–111)
Creatinine, Ser: 1.92 mg/dL — ABNORMAL HIGH (ref 0.61–1.24)
GFR calc Af Amer: 36 mL/min — ABNORMAL LOW (ref >60)
GFR calc non Af Amer: 31 mL/min — ABNORMAL LOW (ref >60)
Glucose, Bld: 137 mg/dL — ABNORMAL HIGH (ref 70–99)
Potassium: 4.5 mmol/L (ref 3.5–5.1)
Sodium: 140 mmol/L (ref 135–145)

## 2019-08-15 LAB — GLUCOSE, CAPILLARY: Glucose-Capillary: 117 mg/dL — ABNORMAL HIGH (ref 70–99)

## 2019-08-15 LAB — CBC
HCT: 36.7 % — ABNORMAL LOW (ref 39.0–52.0)
Hemoglobin: 11.5 g/dL — ABNORMAL LOW (ref 13.0–17.0)
MCH: 29.3 pg (ref 26.0–34.0)
MCHC: 31.3 g/dL (ref 30.0–36.0)
MCV: 93.4 fL (ref 80.0–100.0)
Platelets: 273 10*3/uL (ref 150–400)
RBC: 3.93 MIL/uL — ABNORMAL LOW (ref 4.22–5.81)
RDW: 14.6 % (ref 11.5–15.5)
WBC: 9.6 10*3/uL (ref 4.0–10.5)
nRBC: 0 % (ref 0.0–0.2)

## 2019-08-15 SURGERY — LARYNGOSCOPY, DIRECT
Anesthesia: General | Site: Mouth

## 2019-08-15 MED ORDER — PHENYLEPHRINE HCL-NACL 10-0.9 MG/250ML-% IV SOLN
INTRAVENOUS | Status: DC | PRN
  Administered 2019-08-15: 40 ug/min via INTRAVENOUS

## 2019-08-15 MED ORDER — LACTATED RINGERS IV SOLN
INTRAVENOUS | Status: DC | PRN
  Administered 2019-08-15: 10:00:00 via INTRAVENOUS

## 2019-08-15 MED ORDER — OXYMETAZOLINE HCL 0.05 % NA SOLN
NASAL | Status: AC
  Filled 2019-08-15: qty 30

## 2019-08-15 MED ORDER — PROPOFOL 10 MG/ML IV BOLUS
INTRAVENOUS | Status: DC | PRN
  Administered 2019-08-15: 80 mg via INTRAVENOUS
  Administered 2019-08-15: 30 mg via INTRAVENOUS

## 2019-08-15 MED ORDER — OXYCODONE HCL 5 MG/5ML PO SOLN: 5.0000 mg | Freq: Once | ORAL | Status: DC | PRN

## 2019-08-15 MED ORDER — SUCCINYLCHOLINE CHLORIDE 20 MG/ML IJ SOLN
INTRAMUSCULAR | Status: DC | PRN
  Administered 2019-08-15: 100 mg via INTRAVENOUS

## 2019-08-15 MED ORDER — FENTANYL CITRATE (PF) 250 MCG/5ML IJ SOLN
INTRAMUSCULAR | Status: AC
  Filled 2019-08-15: qty 5

## 2019-08-15 MED ORDER — DEXAMETHASONE SODIUM PHOSPHATE 10 MG/ML IJ SOLN
INTRAMUSCULAR | Status: DC | PRN
  Administered 2019-08-15: 10 mg via INTRAVENOUS

## 2019-08-15 MED ORDER — LACTATED RINGERS IV SOLN
INTRAVENOUS | Status: DC
  Administered 2019-08-15: 10:00:00 via INTRAVENOUS

## 2019-08-15 MED ORDER — SUCCINYLCHOLINE CHLORIDE 200 MG/10ML IV SOSY
PREFILLED_SYRINGE | INTRAVENOUS | Status: AC
  Filled 2019-08-15: qty 10

## 2019-08-15 MED ORDER — PHENYLEPHRINE 40 MCG/ML (10ML) SYRINGE FOR IV PUSH (FOR BLOOD PRESSURE SUPPORT)
PREFILLED_SYRINGE | INTRAVENOUS | Status: DC | PRN
  Administered 2019-08-15 (×2): 40 ug via INTRAVENOUS

## 2019-08-15 MED ORDER — LIDOCAINE 2% (20 MG/ML) 5 ML SYRINGE
INTRAMUSCULAR | Status: DC | PRN
  Administered 2019-08-15: 100 mg via INTRAVENOUS

## 2019-08-15 MED ORDER — FENTANYL CITRATE (PF) 100 MCG/2ML IJ SOLN: 25.0000 ug | INTRAMUSCULAR | Status: DC | PRN

## 2019-08-15 MED ORDER — FENTANYL CITRATE (PF) 250 MCG/5ML IJ SOLN
INTRAMUSCULAR | Status: DC | PRN
  Administered 2019-08-15 (×2): 50 ug via INTRAVENOUS

## 2019-08-15 MED ORDER — OXYCODONE HCL 5 MG PO TABS: 5.0000 mg | ORAL_TABLET | Freq: Once | ORAL | Status: DC | PRN

## 2019-08-15 MED ORDER — ONDANSETRON HCL 4 MG/2ML IJ SOLN
INTRAMUSCULAR | Status: AC
  Filled 2019-08-15: qty 2

## 2019-08-15 MED ORDER — PROPOFOL 10 MG/ML IV BOLUS
INTRAVENOUS | Status: AC
  Filled 2019-08-15: qty 20

## 2019-08-15 MED ORDER — ONDANSETRON HCL 4 MG/2ML IJ SOLN: 4.0000 mg | Freq: Once | INTRAMUSCULAR | Status: DC | PRN

## 2019-08-15 MED ORDER — EPINEPHRINE HCL (NASAL) 0.1 % NA SOLN
NASAL | Status: AC
  Filled 2019-08-15: qty 30

## 2019-08-15 MED ORDER — ONDANSETRON HCL 4 MG/2ML IJ SOLN
INTRAMUSCULAR | Status: DC | PRN
  Administered 2019-08-15: 4 mg via INTRAVENOUS

## 2019-08-15 MED ORDER — LIDOCAINE-EPINEPHRINE 1 %-1:100000 IJ SOLN
INTRAMUSCULAR | Status: AC
  Filled 2019-08-15: qty 1

## 2019-08-15 MED ORDER — LIDOCAINE 2% (20 MG/ML) 5 ML SYRINGE
INTRAMUSCULAR | Status: AC
  Filled 2019-08-15: qty 5

## 2019-08-15 MED ORDER — OXYMETAZOLINE HCL 0.05 % NA SOLN
NASAL | Status: DC | PRN
  Administered 2019-08-15: 1

## 2019-08-15 SURGICAL SUPPLY — 33 items
BALLN PULM 15 16.5 18 X 75CM (BALLOONS)
BALLN PULM 15 16.5 18X75 (BALLOONS)
BALLOON PULM 15 16.5 18X75 (BALLOONS) IMPLANT
BLADE SURG 15 STRL LF DISP TIS (BLADE) IMPLANT
BLADE SURG 15 STRL SS (BLADE)
CANISTER SUCT 3000ML PPV (MISCELLANEOUS) ×3 IMPLANT
CONT SPEC 4OZ CLIKSEAL STRL BL (MISCELLANEOUS) IMPLANT
COVER BACK TABLE 60X90IN (DRAPES) ×3 IMPLANT
COVER MAYO STAND STRL (DRAPES) ×3 IMPLANT
COVER WAND RF STERILE (DRAPES) ×3 IMPLANT
DRAPE HALF SHEET 40X57 (DRAPES) ×3 IMPLANT
GAUZE 4X4 16PLY RFD (DISPOSABLE) ×3 IMPLANT
GAUZE SPONGE 4X4 12PLY STRL (GAUZE/BANDAGES/DRESSINGS) ×3 IMPLANT
GLOVE BIO SURGEON STRL SZ7.5 (GLOVE) ×3 IMPLANT
GOWN STRL REUS W/ TWL LRG LVL3 (GOWN DISPOSABLE) ×2 IMPLANT
GOWN STRL REUS W/TWL LRG LVL3 (GOWN DISPOSABLE) ×6
GUARD TEETH (MISCELLANEOUS) ×3 IMPLANT
KIT BASIN OR (CUSTOM PROCEDURE TRAY) ×3 IMPLANT
KIT TURNOVER KIT B (KITS) ×3 IMPLANT
NDL HYPO 25GX1X1/2 BEV (NEEDLE) IMPLANT
NDL TRANS ORAL INJECTION (NEEDLE) IMPLANT
NEEDLE HYPO 25GX1X1/2 BEV (NEEDLE) IMPLANT
NEEDLE TRANS ORAL INJECTION (NEEDLE) IMPLANT
NS IRRIG 1000ML POUR BTL (IV SOLUTION) ×3 IMPLANT
PAD ARMBOARD 7.5X6 YLW CONV (MISCELLANEOUS) ×6 IMPLANT
PATTIES SURGICAL .5 X3 (DISPOSABLE) IMPLANT
POSITIONER HEAD DONUT 9IN (MISCELLANEOUS) IMPLANT
SOL ANTI FOG 6CC (MISCELLANEOUS) ×1 IMPLANT
SOLUTION ANTI FOG 6CC (MISCELLANEOUS) ×2
SURGILUBE 2OZ TUBE FLIPTOP (MISCELLANEOUS) ×3 IMPLANT
TOWEL GREEN STERILE FF (TOWEL DISPOSABLE) ×6 IMPLANT
TUBE CONNECTING 12'X1/4 (SUCTIONS) ×1
TUBE CONNECTING 12X1/4 (SUCTIONS) ×2 IMPLANT

## 2019-08-15 NOTE — Op Note (Signed)
NAME: [name], [name] MEDICAL RECORD [id number] ACCOUNT [id number] DATE OF BIRTH:06/09/1931 FACILITY: [facility] LOCATION: [facility] PHYSICIAN:Johneric Mcfadden D. Shanicka Oldenkamp, MD  OPERATIVE REPORT  DATE OF PROCEDURE:  08/15/2019  PREOPERATIVE DIAGNOSIS:  Right vocal cord cancer and hoarseness.  POSTOPERATIVE DIAGNOSIS:  Right vocal cord cancer and hoarseness.  PROCEDURE:   1.  Microdirect laryngoscopy with biopsy. 2.  Rigid esophagoscopy.  SURGEON:  Melida Quitter, MD  ANESTHESIA:  General endotracheal anesthesia.  COMPLICATIONS:  None.  INDICATIONS:  The patient is an 85 year old male with hoarseness for the last couple of months and some difficulty swallowing.  He was found to have an irregular appearing right vocal fold in the office and presents to the operating room for management  for biopsy.  FINDINGS:  The entire right vocal fold is irregular with a whitish mass appearance.  This also involves the anterior commissure, but does not extend on to the left vocal cord.  The abnormal tissue extends laterally into the ventricle out of view.  There  were no other lesions seen on laryngoscopy and esophagoscopy.  DESCRIPTION OF PROCEDURE:  The patient was identified in the holding room, informed consent having been obtained with discussion of risks, benefits and alternatives, the patient was brought to the operative suite and table in supine position.  Anesthesia  was induced and the patient was intubated by the anesthesia team without difficulty.  The patient was given intravenous steroids during the case.  The eyes were taped closed and the bed was turned 90 degrees from anesthesia.  A head wrap was placed  around the patient's head and a damp gauze was placed over the upper gum.  A Stortz laryngoscope was then inserted and used to view the various areas of the pharynx and larynx including the vallecula, piriform sinuses, hypopharynx, and endolarynx.  The  laryngoscope was placed in suspension on the  Mayo stand using a Lewy arm with the vocal folds in view.  A photograph was made with a 0 degree telescope.  A few biopsies were then taken from the right vocal fold using cup forceps.  The larynx was  carefully examined with the findings noted above.  After this was completed, the laryngoscope was taken out of suspension from the patient's mouth.  A cervical esophagoscope was then placed through the mouth and passed down the lumen keeping the lumen in  view.  It was then carefully backed out, examining the esophagus while suctioning secretions.  This was then removed.  The patient was then returned to anesthesia for wakeup and was extubated in the recovery room in stable condition.  TN/NUANCE  D:08/15/2019 T:08/15/2019 JOB:[id number]

## 2019-08-15 NOTE — Anesthesia Procedure Notes (Signed)
Procedure Name: Intubation Date/Time: 08/15/2019 9:53 AM Performed by: Amadeo Garnet, CRNA Pre-anesthesia Checklist: Patient identified, Emergency Drugs available, Suction available, Patient being monitored and Timeout performed Patient Re-evaluated:Patient Re-evaluated prior to induction Oxygen Delivery Method: Circle system utilized Preoxygenation: Pre-oxygenation with 100% oxygen Induction Type: IV induction Ventilation: Mask ventilation without difficulty Laryngoscope Size: Mac and 4 Grade View: Grade I Tube size: 6.5 mm Airway Equipment and Method: Stylet Placement Confirmation: positive ETCO2,  ETT inserted through vocal cords under direct vision and breath sounds checked- equal and bilateral Secured at: 23 cm Tube secured with: Tape Dental Injury: Teeth and Oropharynx as per pre-operative assessment

## 2019-08-15 NOTE — Brief Op Note (Signed)
08/15/2019  10:15 AM  PATIENT:  [name]  85 y.o. male  PRE-OPERATIVE DIAGNOSIS:  LARYNGEAL CANCER  POST-OPERATIVE DIAGNOSIS:  LARYNGEAL CANCER  PROCEDURE:  Procedure(s): MICRODIRECT LARYNGOSCOPY WITH BIOPSY (N/A) RIGID ESOPHAGOSCOPY (N/A)  SURGEON:  Surgeon(s) and Role:    * Melida Quitter, MD - Primary  PHYSICIAN ASSISTANT:   ASSISTANTS: none   ANESTHESIA:   general  EBL: Minimal  BLOOD ADMINISTERED:none  DRAINS: none   LOCAL MEDICATIONS USED:  NONE  SPECIMEN:  Source of Specimen:  Right vocal fold  DISPOSITION OF SPECIMEN:  PATHOLOGY  COUNTS:  YES  TOURNIQUET:  * No tourniquets in log *  DICTATION: .Other Dictation: Dictation Number [id number]  PLAN OF CARE: Discharge to home after PACU  PATIENT DISPOSITION:  PACU - hemodynamically stable.   Delay start of Pharmacological VTE agent (>24hrs) due to surgical blood loss or risk of bleeding: no

## 2019-08-15 NOTE — Anesthesia Postprocedure Evaluation (Signed)
Anesthesia Post Note  Patient: BUFFORD HELMS  Procedure(s) Performed: MICRODIRECT LARYNGOSCOPY WITH BIOPSY (N/A Mouth) RIGID ESOPHAGOSCOPY (N/A Mouth)     Patient location during evaluation: PACU Anesthesia Type: General Level of consciousness: awake and alert Pain management: pain level controlled Vital Signs Assessment: post-procedure vital signs reviewed and stable Respiratory status: spontaneous breathing, nonlabored ventilation and respiratory function stable Cardiovascular status: blood pressure returned to baseline and stable Postop Assessment: no apparent nausea or vomiting Anesthetic complications: no    Last Vitals:  Vitals:   08/15/19 1100 08/15/19 1130  BP: (!) 126/55 135/78  Pulse: 64   Resp: 19   Temp:    SpO2: 100%     Last Pain:  Vitals:   08/15/19 1130  TempSrc:   PainSc: 0-No pain                 Lidia Collum

## 2019-08-15 NOTE — Telephone Encounter (Signed)
Received call from [facility] with [facility] requesting clarification on APS status. Explained that portion of the form was completed by [facility]. To PCP's knowledge there is no APS involvement. Hubbard Hartshorn, BSN, RN-BC

## 2019-08-15 NOTE — Telephone Encounter (Signed)
Sharyn Lull, SW with [facility] called regarding patient. State she notified APS several days ago as patient is living in an unsafe, unclean living environment and is unable to care for himself. States he leaves pull-ups filed with urine and feces lying on the floor as he does not have strength to dispose of them. There is no food in the home and he relies on sister or son to bring him meals. Son works 12 h/day 6 days/week. Patient is wanting to transition to ALF. Sharyn Lull thinks he needs higher level of care (SNF). Patient's family is resistant to this and feels patient should go to rehab instead. States FL2 can be faxed to her at [phone]. This has been done. Hubbard Hartshorn, BSN, RN-BC

## 2019-08-15 NOTE — Telephone Encounter (Signed)
Correct, I have no information about the circumstances of that issue.

## 2019-08-15 NOTE — Anesthesia Preprocedure Evaluation (Addendum)
Anesthesia Evaluation  Patient identified by MRN, date of birth, ID band Patient awake    Reviewed: Allergy & Precautions, NPO status , Patient's Chart, lab work & pertinent test results  History of Anesthesia Complications Negative for: history of anesthetic complications  Airway Mallampati: II  TM Distance: >3 FB Neck ROM: Full    Dental  (+) Edentulous Upper, Edentulous Lower   Pulmonary neg pulmonary ROS, former smoker,    Pulmonary exam normal        Cardiovascular hypertension, Pt. on medications and Pt. on home beta blockers + CAD, + Past MI, + Peripheral Vascular Disease and +CHF  Normal cardiovascular exam+ dysrhythmias Ventricular Tachycardia + Cardiac Defibrillator      Neuro/Psych Carotid stenosis s/p stent TIAnegative psych ROS   GI/Hepatic Neg liver ROS, GERD  ,  Endo/Other  diabetes, Type 2, Insulin Dependent  Renal/GU Renal InsufficiencyRenal disease  negative genitourinary   Musculoskeletal negative musculoskeletal ROS (+)   Abdominal   Peds  Hematology negative hematology ROS (+)   Anesthesia Other Findings  Mid Cx to Dist Cx , 40 % instent restenosis.  Chronically occluded LAD. Left to left collaterals.  Mid RCA lesion, 50% stenosed.  Mildly increased LVEDP.   No significant change from 2009 cath.  Circumflex stent still patent with mild instent restenosis.  Continue medical therapy.  Gentle hydration post cath to preserve renal function.   Reproductive/Obstetrics                            Anesthesia Physical Anesthesia Plan  ASA: III  Anesthesia Plan: General   Post-op Pain Management:    Induction: Intravenous  PONV Risk Score and Plan: 2 and Ondansetron, Dexamethasone and Treatment may vary due to age or medical condition  Airway Management Planned: Oral ETT  Additional Equipment: None  Intra-op Plan:   Post-operative Plan: Extubation in  OR  Informed Consent: I have reviewed the patients History and Physical, chart, labs and discussed the procedure including the risks, benefits and alternatives for the proposed anesthesia with the patient or authorized representative who has indicated his/her understanding and acceptance.       Plan Discussed with:   Anesthesia Plan Comments:        Anesthesia Quick Evaluation

## 2019-08-15 NOTE — H&P (Signed)
[name] is an 85 y.o. male.   Chief Complaint: Hoarseness, laryngeal cancer HPI: 85 year old male with hoarseness since early September but no throat pain.  He has had difficulty swallowing and has lost 40 pounds since September.  Past Medical History:  Diagnosis Date  . Adenomatous colon polyp 02/14/2012  . AICD (automatic cardioverter/defibrillator) present    Medtronic   . BBB (bundle branch block)    right  . Carotid stenosis    a. s/p L carotid stent 2004;  b. Carotid US (09/2014): Bilateral ICA 1-39%, left ECA >59%, normal subclavian bilaterally, occluded left vertebral >> FU 2 years  . Chronic kidney disease   . Chronic systolic CHF (congestive heart failure) (HCC)    a. ischemic CM EF 15-20%;  b. s/p AICD 05/24/04;  c. Echo 7/06: EF 30-40%, mild reduced RVSF, d. Echo 12/2015 EF 35-40%  . Elevated PSA   . History of kidney stones    x 1  . HTN (hypertension)   . Hyperlipidemia   . ICD (implantable cardiac defibrillator) in place 12-25-2012   MDT CRTD upgrade by Dr Lovena Le  . Myocardial infarction ([place]) 1990  . Pericardial effusion    Echocardiogram (09/2014): EF 25% with distal anterior, distal inferior, distal lateral and apical akinesis, grade 1 diastolic dysfunction, very mild aortic stenosis (mean 7 mmHg) - this may be depressed due to low EF (2-D images suggest mild to moderate aortic stenosis), large pericardial effusion, no RA collapse  . Pneumonia   . Presence of permanent cardiac pacemaker    Medtronic  . PVD (peripheral vascular disease) ([place])    s/p L carotid PTCA/stent 2004  . Transient ischemic attack   . Type II diabetes mellitus ([place])    type II    Past Surgical History:  Procedure Laterality Date  . BI-VENTRICULAR IMPLANTABLE CARDIOVERTER DEFIBRILLATOR UPGRADE N/A 12/25/2012   Procedure: BI-VENTRICULAR IMPLANTABLE CARDIOVERTER DEFIBRILLATOR UPGRADE;  Surgeon: Evans Lance, MD;  Location: [facility] CATH LAB;  Service: Cardiovascular;  Laterality: N/A;  . BIV  ICD UPGRADE  12/25/2012   MDT CRTD upgrade by Dr Lovena Le for ischemic cardiomyopathy and worsening conduction system disease  . CARDIAC CATHETERIZATION  06/2003,  01/2004  . CARDIAC CATHETERIZATION N/A 01/13/2016   Procedure: Left Heart Cath and Coronary Angiography;  Surgeon: Jettie Booze, MD;  Location: [facility] CV LAB;  Service: Cardiovascular;  Laterality: N/A;  . CARDIAC DEFIBRILLATOR PLACEMENT  05/24/2004   Implantation of a MDT single-chamber defibrillator  . CAROTID STENT  09/11/2003   Percutaneous transluminal angioplasty and stent placement of the left internal carotid artery.  Marland Kitchen CATARACT EXTRACTION W/ INTRAOCULAR LENS  IMPLANT, BILATERAL Bilateral ~ 2010  . CORONARY ANGIOPLASTY WITH STENT PLACEMENT  1990   "2" (12/25/2012)  . CYSTOSCOPY    . INSERT / REPLACE / REMOVE PACEMAKER    . LEAD REVISION N/A 12/25/2012   Procedure: LEAD REVISION;  Surgeon: Evans Lance, MD;  Location: [facility] CATH LAB;  Service: Cardiovascular;  Laterality: N/A;    Family History  Problem Relation Age of Onset  . Diabetes Mother   . Healthy Father   . Diabetes Brother   . Heart attack Neg Hx   . Stroke Neg Hx    Social History:  reports that he quit smoking about 51 years ago. His smoking use included cigarettes. He has never used smokeless tobacco. He reports that he does not drink alcohol or use drugs.  Allergies: No Known Allergies  Medications Prior to  Admission  Medication Sig Dispense Refill  . aspirin EC 81 MG tablet Take 81 mg by mouth daily.    Marland Kitchen atorvastatin (LIPITOR) 40 MG tablet TAKE 1 TABLET(40 MG) BY MOUTH DAILY AT 6 PM (Patient taking differently: Take 40 mg by mouth daily at 6 PM. ) 90 tablet 3  . bisacodyl (DULCOLAX) 5 MG EC tablet Take 2 tablets (10 mg total) by mouth daily. 30 tablet 1  . carvedilol (COREG) 6.25 MG tablet Take 1 tablet (6.25 mg total) by mouth 2 (two) times daily with a meal. 60 tablet 1  . alum & mag hydroxide-simeth (MAALOX/MYLANTA) 200-200-20 MG/5ML suspension  Take 15 mLs by mouth every 6 (six) hours as needed for indigestion or heartburn. 355 mL 0  . Blood Glucose Monitoring Suppl (ACCU-CHEK AVIVA PLUS) w/Device KIT Check finger stick glucose once daily 1 kit 0  . furosemide (LASIX) 40 MG tablet Take 1 tablet (40 mg total) by mouth 2 (two) times daily. (Patient taking differently: Take 20 mg by mouth 2 (two) times daily. ) 180 tablet 3  . glucose blood (ACCU-CHEK AVIVA PLUS) test strip Check blood sugar up to 3 times a day 300 each 5  . Hydrocortisone, Perianal, (PROCTO-PAK) 1 % CREA Apply 1 application topically 2 (two) times daily. 28 g 0  . insulin glargine (LANTUS) 100 UNIT/ML injection Inject 0.07 mLs (7 Units total) into the skin daily. 10 mL 11  . Insulin Pen Needle (B-D ULTRAFINE III SHORT PEN) 31G X 8 MM MISC USE ONCE DAILY WITH LANTUS PEN 100 each 3  . lidocaine (XYLOCAINE) 2 % jelly Apply 1 application topically as needed. (Patient taking differently: Apply 1 application topically as needed (wound). ) 30 mL 0  . pantoprazole (PROTONIX) 40 MG tablet TAKE 1 TABLET(40 MG) BY MOUTH DAILY (Patient taking differently: Take 40 mg by mouth daily. ) 90 tablet 1  . polyethylene glycol (MIRALAX / GLYCOLAX) 17 g packet Take 17 g by mouth daily. 14 each 0  . senna-docusate (SENOKOT-S) 8.6-50 MG tablet Take 2 tablets by mouth daily. (Patient taking differently: Take 2 tablets by mouth daily as needed for mild constipation. ) 30 tablet 2  . tamsulosin (FLOMAX) 0.4 MG CAPS capsule Take 0.8 mg by mouth at bedtime.     . VENTOLIN HFA 108 (90 Base) MCG/ACT inhaler INHALE 2 PUFFS INTO THE LUNGS EVERY 6 HOURS AS NEEDED FOR WHEEZING OR SHORTNESS OF BREATH (Patient taking differently: Inhale 2 puffs into the lungs every 6 (six) hours as needed for wheezing or shortness of breath. ) 18 g 5    Results for orders placed or performed during the hospital encounter of 08/15/19 (from the past 48 hour(s))  Glucose, capillary     Status: Abnormal   Collection Time: 08/15/19   8:50 AM  Result Value Ref Range   Glucose-Capillary 117 (H) 70 - 99 mg/dL   Comment 1 Notify RN    Comment 2 Document in Chart    No results found.  Review of Systems  All other systems reviewed and are negative.   Blood pressure (!) 157/65, pulse 73, temperature 97.9 F (36.6 C), temperature source Oral, resp. rate 20, height '5\' 11"'$  (1.803 m), weight 54 kg, SpO2 100 %. Physical Exam  Constitutional: He is oriented to person, place, and time. He appears well-developed and well-nourished. No distress.  HENT:  Head: Normocephalic and atraumatic.  Right Ear: External ear normal.  Left Ear: External ear normal.  Nose: Nose normal.  Mouth/Throat: Oropharynx  is clear and moist.  Rough hoarseness.  Eyes: Pupils are equal, round, and reactive to light. Conjunctivae and EOM are normal.  Neck: Normal range of motion. Neck supple.  Cardiovascular: Normal rate.  Respiratory: Effort normal.  Neurological: He is alert and oriented to person, place, and time. No cranial nerve deficit.  Skin: Skin is warm and dry.  Psychiatric: He has a normal mood and affect. His behavior is normal. Judgment and thought content normal.     Assessment/Plan Hoarseness, dysphagia, and right vocal cord cancer  To OR for direct laryngoscopy and esophagoscopy.  Melida Quitter, MD 08/15/2019, 9:06 AM

## 2019-08-15 NOTE — Transfer of Care (Signed)
Immediate Anesthesia Transfer of Care Note  Patient: [name]  Procedure(s) Performed: Mayra Neer LARYNGOSCOPY WITH BIOPSY (N/A Mouth) RIGID ESOPHAGOSCOPY (N/A Mouth)  Patient Location: PACU  Anesthesia Type:General  Level of Consciousness: awake, alert  and oriented  Airway & Oxygen Therapy: Patient Spontanous Breathing and Patient connected to face mask oxygen  Post-op Assessment: Report given to RN, Post -op Vital signs reviewed and stable and Patient moving all extremities  Post vital signs: Reviewed and stable  Last Vitals:  Vitals Value Taken Time  BP 127/48 08/15/19 1038  Temp 36.5 C 08/15/19 1036  Pulse 65 08/15/19 1040  Resp 16 08/15/19 1040  SpO2 100 % 08/15/19 1040  Vitals shown include unvalidated device data.  Last Pain:  Vitals:   08/15/19 1036  TempSrc:   PainSc: (P) Asleep         Complications: No apparent anesthesia complications

## 2019-08-16 ENCOUNTER — Encounter (HOSPITAL_COMMUNITY): Payer: Self-pay | Admitting: Otolaryngology

## 2019-08-18 ENCOUNTER — Other Ambulatory Visit: Payer: Self-pay | Admitting: Urology

## 2019-08-18 ENCOUNTER — Encounter: Payer: Self-pay | Admitting: *Deleted

## 2019-08-18 DIAGNOSIS — N183 Chronic kidney disease, stage 3 unspecified: Secondary | ICD-10-CM | POA: Diagnosis not present

## 2019-08-18 DIAGNOSIS — N130 Hydronephrosis with ureteropelvic junction obstruction: Secondary | ICD-10-CM

## 2019-08-18 DIAGNOSIS — R1312 Dysphagia, oropharyngeal phase: Secondary | ICD-10-CM | POA: Diagnosis not present

## 2019-08-18 DIAGNOSIS — N401 Enlarged prostate with lower urinary tract symptoms: Secondary | ICD-10-CM | POA: Diagnosis not present

## 2019-08-18 DIAGNOSIS — I5022 Chronic systolic (congestive) heart failure: Secondary | ICD-10-CM | POA: Diagnosis not present

## 2019-08-18 DIAGNOSIS — E1122 Type 2 diabetes mellitus with diabetic chronic kidney disease: Secondary | ICD-10-CM | POA: Diagnosis not present

## 2019-08-18 DIAGNOSIS — K5900 Constipation, unspecified: Secondary | ICD-10-CM | POA: Diagnosis not present

## 2019-08-18 DIAGNOSIS — R338 Other retention of urine: Secondary | ICD-10-CM | POA: Diagnosis not present

## 2019-08-18 DIAGNOSIS — E11319 Type 2 diabetes mellitus with unspecified diabetic retinopathy without macular edema: Secondary | ICD-10-CM | POA: Diagnosis not present

## 2019-08-18 DIAGNOSIS — I130 Hypertensive heart and chronic kidney disease with heart failure and stage 1 through stage 4 chronic kidney disease, or unspecified chronic kidney disease: Secondary | ICD-10-CM | POA: Diagnosis not present

## 2019-08-18 LAB — SURGICAL PATHOLOGY

## 2019-08-18 NOTE — Telephone Encounter (Signed)
Ok. Thank you for the update

## 2019-08-19 DIAGNOSIS — E1122 Type 2 diabetes mellitus with diabetic chronic kidney disease: Secondary | ICD-10-CM | POA: Diagnosis not present

## 2019-08-19 DIAGNOSIS — K6289 Other specified diseases of anus and rectum: Secondary | ICD-10-CM | POA: Diagnosis not present

## 2019-08-19 DIAGNOSIS — N401 Enlarged prostate with lower urinary tract symptoms: Secondary | ICD-10-CM | POA: Diagnosis not present

## 2019-08-19 DIAGNOSIS — I130 Hypertensive heart and chronic kidney disease with heart failure and stage 1 through stage 4 chronic kidney disease, or unspecified chronic kidney disease: Secondary | ICD-10-CM | POA: Diagnosis not present

## 2019-08-19 DIAGNOSIS — N183 Chronic kidney disease, stage 3 unspecified: Secondary | ICD-10-CM | POA: Diagnosis not present

## 2019-08-19 DIAGNOSIS — I252 Old myocardial infarction: Secondary | ICD-10-CM | POA: Diagnosis not present

## 2019-08-19 DIAGNOSIS — R338 Other retention of urine: Secondary | ICD-10-CM | POA: Diagnosis not present

## 2019-08-19 DIAGNOSIS — I5022 Chronic systolic (congestive) heart failure: Secondary | ICD-10-CM | POA: Diagnosis not present

## 2019-08-19 DIAGNOSIS — R1310 Dysphagia, unspecified: Secondary | ICD-10-CM | POA: Diagnosis not present

## 2019-08-19 DIAGNOSIS — R1312 Dysphagia, oropharyngeal phase: Secondary | ICD-10-CM | POA: Diagnosis not present

## 2019-08-19 DIAGNOSIS — K5900 Constipation, unspecified: Secondary | ICD-10-CM | POA: Diagnosis not present

## 2019-08-19 DIAGNOSIS — E11319 Type 2 diabetes mellitus with unspecified diabetic retinopathy without macular edema: Secondary | ICD-10-CM | POA: Diagnosis not present

## 2019-08-19 DIAGNOSIS — I2510 Atherosclerotic heart disease of native coronary artery without angina pectoris: Secondary | ICD-10-CM | POA: Diagnosis not present

## 2019-08-20 DIAGNOSIS — I5022 Chronic systolic (congestive) heart failure: Secondary | ICD-10-CM | POA: Diagnosis not present

## 2019-08-20 DIAGNOSIS — N183 Chronic kidney disease, stage 3 unspecified: Secondary | ICD-10-CM | POA: Diagnosis not present

## 2019-08-20 DIAGNOSIS — E1122 Type 2 diabetes mellitus with diabetic chronic kidney disease: Secondary | ICD-10-CM | POA: Diagnosis not present

## 2019-08-20 DIAGNOSIS — R1312 Dysphagia, oropharyngeal phase: Secondary | ICD-10-CM | POA: Diagnosis not present

## 2019-08-20 DIAGNOSIS — N401 Enlarged prostate with lower urinary tract symptoms: Secondary | ICD-10-CM | POA: Diagnosis not present

## 2019-08-20 DIAGNOSIS — K5900 Constipation, unspecified: Secondary | ICD-10-CM | POA: Diagnosis not present

## 2019-08-20 DIAGNOSIS — I130 Hypertensive heart and chronic kidney disease with heart failure and stage 1 through stage 4 chronic kidney disease, or unspecified chronic kidney disease: Secondary | ICD-10-CM | POA: Diagnosis not present

## 2019-08-20 DIAGNOSIS — R338 Other retention of urine: Secondary | ICD-10-CM | POA: Diagnosis not present

## 2019-08-20 DIAGNOSIS — E11319 Type 2 diabetes mellitus with unspecified diabetic retinopathy without macular edema: Secondary | ICD-10-CM | POA: Diagnosis not present

## 2019-08-22 ENCOUNTER — Telehealth: Payer: Self-pay | Admitting: Cardiology

## 2019-08-22 NOTE — Telephone Encounter (Signed)
Patient was returning a call about overdue pacemaker transmission.  He was just in the hospital and just got the message.  He says that his home pacemaker box is old and no longer working.  He is not having any issues related to his pacemaker.  Our office is not open today. I will send a message to the device clinic to have them call the patient on Monday to figure out how to arrange for his interrogation and possible needing new equipment.

## 2019-08-25 ENCOUNTER — Telehealth: Payer: Self-pay

## 2019-08-25 NOTE — Telephone Encounter (Signed)
LMOVM for pt to call me about his monitor.

## 2019-08-25 NOTE — Telephone Encounter (Signed)
See phone note opened 08/25/19.

## 2019-08-25 NOTE — Telephone Encounter (Signed)
Original note:   Robert Perch, NP routed conversation to [employer] 3 days ago    Robert Perch, NP 3 days ago     Patient was returning a call about overdue pacemaker transmission.  He was just in the hospital and just got the message.  He says that his home pacemaker box is old and no longer working.  He is not having any issues related to his pacemaker.  Our office is not open today. I will send a message to the device clinic to have them call the patient on Monday to figure out how to arrange for his interrogation and possible needing new equipment.

## 2019-08-26 ENCOUNTER — Other Ambulatory Visit: Payer: Medicare HMO

## 2019-08-26 DIAGNOSIS — E1122 Type 2 diabetes mellitus with diabetic chronic kidney disease: Secondary | ICD-10-CM | POA: Diagnosis not present

## 2019-08-26 DIAGNOSIS — N183 Chronic kidney disease, stage 3 unspecified: Secondary | ICD-10-CM | POA: Diagnosis not present

## 2019-08-26 DIAGNOSIS — I130 Hypertensive heart and chronic kidney disease with heart failure and stage 1 through stage 4 chronic kidney disease, or unspecified chronic kidney disease: Secondary | ICD-10-CM | POA: Diagnosis not present

## 2019-08-26 DIAGNOSIS — N401 Enlarged prostate with lower urinary tract symptoms: Secondary | ICD-10-CM | POA: Diagnosis not present

## 2019-08-26 DIAGNOSIS — R338 Other retention of urine: Secondary | ICD-10-CM | POA: Diagnosis not present

## 2019-08-26 DIAGNOSIS — K5900 Constipation, unspecified: Secondary | ICD-10-CM | POA: Diagnosis not present

## 2019-08-26 DIAGNOSIS — I5022 Chronic systolic (congestive) heart failure: Secondary | ICD-10-CM | POA: Diagnosis not present

## 2019-08-26 DIAGNOSIS — E11319 Type 2 diabetes mellitus with unspecified diabetic retinopathy without macular edema: Secondary | ICD-10-CM | POA: Diagnosis not present

## 2019-08-26 DIAGNOSIS — R1312 Dysphagia, oropharyngeal phase: Secondary | ICD-10-CM | POA: Diagnosis not present

## 2019-08-26 NOTE — Telephone Encounter (Signed)
LMOVM

## 2019-08-27 ENCOUNTER — Ambulatory Visit
Admission: RE | Admit: 2019-08-27 | Discharge: 2019-08-27 | Disposition: A | Discharge status: Home / Self Care | Payer: Medicare HMO | Source: Ambulatory Visit | Attending: Urology | Admitting: Urology

## 2019-08-27 DIAGNOSIS — N130 Hydronephrosis with ureteropelvic junction obstruction: Secondary | ICD-10-CM

## 2019-08-27 DIAGNOSIS — N281 Cyst of kidney, acquired: Secondary | ICD-10-CM | POA: Diagnosis not present

## 2019-08-27 DIAGNOSIS — N1330 Unspecified hydronephrosis: Secondary | ICD-10-CM | POA: Diagnosis not present

## 2019-08-28 DIAGNOSIS — N183 Chronic kidney disease, stage 3 unspecified: Secondary | ICD-10-CM | POA: Diagnosis not present

## 2019-08-28 DIAGNOSIS — E1122 Type 2 diabetes mellitus with diabetic chronic kidney disease: Secondary | ICD-10-CM | POA: Diagnosis not present

## 2019-08-28 DIAGNOSIS — I130 Hypertensive heart and chronic kidney disease with heart failure and stage 1 through stage 4 chronic kidney disease, or unspecified chronic kidney disease: Secondary | ICD-10-CM | POA: Diagnosis not present

## 2019-08-28 DIAGNOSIS — N401 Enlarged prostate with lower urinary tract symptoms: Secondary | ICD-10-CM | POA: Diagnosis not present

## 2019-08-28 DIAGNOSIS — R338 Other retention of urine: Secondary | ICD-10-CM | POA: Diagnosis not present

## 2019-08-28 DIAGNOSIS — I5022 Chronic systolic (congestive) heart failure: Secondary | ICD-10-CM | POA: Diagnosis not present

## 2019-08-28 DIAGNOSIS — E11319 Type 2 diabetes mellitus with unspecified diabetic retinopathy without macular edema: Secondary | ICD-10-CM | POA: Diagnosis not present

## 2019-08-28 DIAGNOSIS — K5900 Constipation, unspecified: Secondary | ICD-10-CM | POA: Diagnosis not present

## 2019-08-28 DIAGNOSIS — R1312 Dysphagia, oropharyngeal phase: Secondary | ICD-10-CM | POA: Diagnosis not present

## 2019-08-28 NOTE — Telephone Encounter (Signed)
I spoke with the pt and ordered him a new home remote monitor.

## 2019-08-29 DIAGNOSIS — R338 Other retention of urine: Secondary | ICD-10-CM | POA: Diagnosis not present

## 2019-08-29 DIAGNOSIS — N183 Chronic kidney disease, stage 3 unspecified: Secondary | ICD-10-CM | POA: Diagnosis not present

## 2019-08-29 DIAGNOSIS — I5022 Chronic systolic (congestive) heart failure: Secondary | ICD-10-CM | POA: Diagnosis not present

## 2019-08-29 DIAGNOSIS — E11319 Type 2 diabetes mellitus with unspecified diabetic retinopathy without macular edema: Secondary | ICD-10-CM | POA: Diagnosis not present

## 2019-08-29 DIAGNOSIS — R1312 Dysphagia, oropharyngeal phase: Secondary | ICD-10-CM | POA: Diagnosis not present

## 2019-08-29 DIAGNOSIS — N401 Enlarged prostate with lower urinary tract symptoms: Secondary | ICD-10-CM | POA: Diagnosis not present

## 2019-08-29 DIAGNOSIS — K5900 Constipation, unspecified: Secondary | ICD-10-CM | POA: Diagnosis not present

## 2019-08-29 DIAGNOSIS — I130 Hypertensive heart and chronic kidney disease with heart failure and stage 1 through stage 4 chronic kidney disease, or unspecified chronic kidney disease: Secondary | ICD-10-CM | POA: Diagnosis not present

## 2019-08-29 DIAGNOSIS — E1122 Type 2 diabetes mellitus with diabetic chronic kidney disease: Secondary | ICD-10-CM | POA: Diagnosis not present

## 2019-09-01 ENCOUNTER — Ambulatory Visit: Payer: Medicare HMO

## 2019-09-01 ENCOUNTER — Ambulatory Visit: Payer: Medicare HMO | Admitting: Student in an Organized Health Care Education/Training Program

## 2019-09-02 ENCOUNTER — Telehealth: Payer: Self-pay

## 2019-09-02 NOTE — Telephone Encounter (Signed)
Left message for patient to remind of missed remote transmission.  

## 2019-09-03 DIAGNOSIS — R338 Other retention of urine: Secondary | ICD-10-CM | POA: Diagnosis not present

## 2019-09-03 DIAGNOSIS — N281 Cyst of kidney, acquired: Secondary | ICD-10-CM | POA: Diagnosis not present

## 2019-09-03 DIAGNOSIS — R972 Elevated prostate specific antigen [PSA]: Secondary | ICD-10-CM | POA: Diagnosis not present

## 2019-09-03 NOTE — Telephone Encounter (Signed)
New monitor shipped 08/29/19 per [facility].

## 2019-09-04 ENCOUNTER — Ambulatory Visit (INDEPENDENT_AMBULATORY_CARE_PROVIDER_SITE_OTHER): Payer: Medicare HMO | Admitting: *Deleted

## 2019-09-04 DIAGNOSIS — R338 Other retention of urine: Secondary | ICD-10-CM | POA: Diagnosis not present

## 2019-09-04 DIAGNOSIS — R1312 Dysphagia, oropharyngeal phase: Secondary | ICD-10-CM | POA: Diagnosis not present

## 2019-09-04 DIAGNOSIS — I130 Hypertensive heart and chronic kidney disease with heart failure and stage 1 through stage 4 chronic kidney disease, or unspecified chronic kidney disease: Secondary | ICD-10-CM | POA: Diagnosis not present

## 2019-09-04 DIAGNOSIS — E11319 Type 2 diabetes mellitus with unspecified diabetic retinopathy without macular edema: Secondary | ICD-10-CM | POA: Diagnosis not present

## 2019-09-04 DIAGNOSIS — K5900 Constipation, unspecified: Secondary | ICD-10-CM | POA: Diagnosis not present

## 2019-09-04 DIAGNOSIS — E1122 Type 2 diabetes mellitus with diabetic chronic kidney disease: Secondary | ICD-10-CM | POA: Diagnosis not present

## 2019-09-04 DIAGNOSIS — Z95810 Presence of automatic (implantable) cardiac defibrillator: Secondary | ICD-10-CM

## 2019-09-04 DIAGNOSIS — N183 Chronic kidney disease, stage 3 unspecified: Secondary | ICD-10-CM | POA: Diagnosis not present

## 2019-09-04 DIAGNOSIS — N401 Enlarged prostate with lower urinary tract symptoms: Secondary | ICD-10-CM | POA: Diagnosis not present

## 2019-09-04 DIAGNOSIS — I5022 Chronic systolic (congestive) heart failure: Secondary | ICD-10-CM | POA: Diagnosis not present

## 2019-09-04 LAB — CUP PACEART REMOTE DEVICE CHECK
Battery Remaining Longevity: 11 mo
Battery Remaining Longevity: 11 mo
Battery Voltage: 2.87 V
Battery Voltage: 2.87 V
Brady Statistic AP VP Percent: 36.24 %
Brady Statistic AP VP Percent: 4.27 %
Brady Statistic AP VS Percent: 0.34 %
Brady Statistic AP VS Percent: 0.01 %
Brady Statistic AS VP Percent: 62.07 %
Brady Statistic AS VP Percent: 95.7 %
Brady Statistic AS VS Percent: 1.35 %
Brady Statistic AS VS Percent: 0.03 %
Brady Statistic RA Percent Paced: 36.2 %
Brady Statistic RA Percent Paced: 4.27 %
Brady Statistic RV Percent Paced: 97.01 %
Brady Statistic RV Percent Paced: 99.83 %
Date Time Interrogation Session: 20201209201342
Date Time Interrogation Session: 20201210115937
HighPow Impedance: 49 Ohm
HighPow Impedance: 49 Ohm
Implantable Lead Implant Date: 20140402
Implantable Lead Implant Date: 20140402
Implantable Lead Implant Date: 20140402
Implantable Lead Implant Date: 20140402
Implantable Lead Implant Date: 20140402
Implantable Lead Implant Date: 20140402
Implantable Lead Location: 753858
Implantable Lead Location: 753859
Implantable Lead Location: 753860
Implantable Lead Location: 753858
Implantable Lead Location: 753859
Implantable Lead Location: 753860
Implantable Lead Model: 4194
Implantable Lead Model: 5076
Implantable Lead Model: 6935
Implantable Lead Model: 4194
Implantable Lead Model: 5076
Implantable Lead Model: 6935
Implantable Pulse Generator Implant Date: 20140402
Implantable Pulse Generator Implant Date: 20140402
Lead Channel Impedance Value: 190 Ohm
Lead Channel Impedance Value: 361 Ohm
Lead Channel Impedance Value: 361 Ohm
Lead Channel Impedance Value: 399 Ohm
Lead Channel Impedance Value: 475 Ohm
Lead Channel Impedance Value: 475 Ohm
Lead Channel Impedance Value: 190 Ohm
Lead Channel Impedance Value: 304 Ohm
Lead Channel Impedance Value: 342 Ohm
Lead Channel Impedance Value: 361 Ohm
Lead Channel Impedance Value: 418 Ohm
Lead Channel Impedance Value: 418 Ohm
Lead Channel Pacing Threshold Amplitude: 0.875 V
Lead Channel Pacing Threshold Amplitude: 0.875 V
Lead Channel Pacing Threshold Amplitude: 1 V
Lead Channel Pacing Threshold Amplitude: 0.875 V
Lead Channel Pacing Threshold Amplitude: 0.875 V
Lead Channel Pacing Threshold Amplitude: 1.125 V
Lead Channel Pacing Threshold Pulse Width: 0.4 ms
Lead Channel Pacing Threshold Pulse Width: 0.4 ms
Lead Channel Pacing Threshold Pulse Width: 0.6 ms
Lead Channel Pacing Threshold Pulse Width: 0.4 ms
Lead Channel Pacing Threshold Pulse Width: 0.4 ms
Lead Channel Pacing Threshold Pulse Width: 0.6 ms
Lead Channel Sensing Intrinsic Amplitude: 1.875 mV
Lead Channel Sensing Intrinsic Amplitude: 1.875 mV
Lead Channel Sensing Intrinsic Amplitude: 19.625 mV
Lead Channel Sensing Intrinsic Amplitude: 19.625 mV
Lead Channel Sensing Intrinsic Amplitude: 19.625 mV
Lead Channel Sensing Intrinsic Amplitude: 19.625 mV
Lead Channel Sensing Intrinsic Amplitude: 2.125 mV
Lead Channel Sensing Intrinsic Amplitude: 2.125 mV
Lead Channel Setting Pacing Amplitude: 1.5 V
Lead Channel Setting Pacing Amplitude: 2 V
Lead Channel Setting Pacing Amplitude: 2.5 V
Lead Channel Setting Pacing Amplitude: 1.75 V
Lead Channel Setting Pacing Amplitude: 2 V
Lead Channel Setting Pacing Amplitude: 2.5 V
Lead Channel Setting Pacing Pulse Width: 0.4 ms
Lead Channel Setting Pacing Pulse Width: 0.6 ms
Lead Channel Setting Pacing Pulse Width: 0.4 ms
Lead Channel Setting Pacing Pulse Width: 0.6 ms
Lead Channel Setting Sensing Sensitivity: 0.3 mV
Lead Channel Setting Sensing Sensitivity: 0.3 mV

## 2019-09-04 NOTE — Telephone Encounter (Signed)
Transmission received 09-04-2019. Pt was scheduled an appointment.

## 2019-09-04 NOTE — Telephone Encounter (Signed)
Transmission received. Normal device function, no episodes  Chanetta Marshall, NP 09/04/2019 12:38 PM

## 2019-09-05 DIAGNOSIS — I130 Hypertensive heart and chronic kidney disease with heart failure and stage 1 through stage 4 chronic kidney disease, or unspecified chronic kidney disease: Secondary | ICD-10-CM | POA: Diagnosis not present

## 2019-09-05 DIAGNOSIS — N401 Enlarged prostate with lower urinary tract symptoms: Secondary | ICD-10-CM | POA: Diagnosis not present

## 2019-09-05 DIAGNOSIS — E1122 Type 2 diabetes mellitus with diabetic chronic kidney disease: Secondary | ICD-10-CM | POA: Diagnosis not present

## 2019-09-05 DIAGNOSIS — N183 Chronic kidney disease, stage 3 unspecified: Secondary | ICD-10-CM | POA: Diagnosis not present

## 2019-09-05 DIAGNOSIS — K5900 Constipation, unspecified: Secondary | ICD-10-CM | POA: Diagnosis not present

## 2019-09-05 DIAGNOSIS — R338 Other retention of urine: Secondary | ICD-10-CM | POA: Diagnosis not present

## 2019-09-05 DIAGNOSIS — I5022 Chronic systolic (congestive) heart failure: Secondary | ICD-10-CM | POA: Diagnosis not present

## 2019-09-05 DIAGNOSIS — R1312 Dysphagia, oropharyngeal phase: Secondary | ICD-10-CM | POA: Diagnosis not present

## 2019-09-05 DIAGNOSIS — E11319 Type 2 diabetes mellitus with unspecified diabetic retinopathy without macular edema: Secondary | ICD-10-CM | POA: Diagnosis not present

## 2019-09-08 DIAGNOSIS — R338 Other retention of urine: Secondary | ICD-10-CM | POA: Diagnosis not present

## 2019-09-08 DIAGNOSIS — N183 Chronic kidney disease, stage 3 unspecified: Secondary | ICD-10-CM | POA: Diagnosis not present

## 2019-09-08 DIAGNOSIS — I5022 Chronic systolic (congestive) heart failure: Secondary | ICD-10-CM | POA: Diagnosis not present

## 2019-09-08 DIAGNOSIS — E1122 Type 2 diabetes mellitus with diabetic chronic kidney disease: Secondary | ICD-10-CM | POA: Diagnosis not present

## 2019-09-08 DIAGNOSIS — E11319 Type 2 diabetes mellitus with unspecified diabetic retinopathy without macular edema: Secondary | ICD-10-CM | POA: Diagnosis not present

## 2019-09-08 DIAGNOSIS — R1312 Dysphagia, oropharyngeal phase: Secondary | ICD-10-CM | POA: Diagnosis not present

## 2019-09-08 DIAGNOSIS — K5900 Constipation, unspecified: Secondary | ICD-10-CM | POA: Diagnosis not present

## 2019-09-08 DIAGNOSIS — N401 Enlarged prostate with lower urinary tract symptoms: Secondary | ICD-10-CM | POA: Diagnosis not present

## 2019-09-08 DIAGNOSIS — I130 Hypertensive heart and chronic kidney disease with heart failure and stage 1 through stage 4 chronic kidney disease, or unspecified chronic kidney disease: Secondary | ICD-10-CM | POA: Diagnosis not present

## 2019-09-09 DIAGNOSIS — H02142 Spastic ectropion of right lower eyelid: Secondary | ICD-10-CM | POA: Diagnosis not present

## 2019-09-09 DIAGNOSIS — H02135 Senile ectropion of left lower eyelid: Secondary | ICD-10-CM | POA: Diagnosis not present

## 2019-09-09 DIAGNOSIS — H04563 Stenosis of bilateral lacrimal punctum: Secondary | ICD-10-CM | POA: Diagnosis not present

## 2019-09-09 DIAGNOSIS — H02112 Cicatricial ectropion of right lower eyelid: Secondary | ICD-10-CM | POA: Diagnosis not present

## 2019-09-09 DIAGNOSIS — D3102 Benign neoplasm of left conjunctiva: Secondary | ICD-10-CM | POA: Diagnosis not present

## 2019-09-09 DIAGNOSIS — H02132 Senile ectropion of right lower eyelid: Secondary | ICD-10-CM | POA: Diagnosis not present

## 2019-09-09 DIAGNOSIS — H04123 Dry eye syndrome of bilateral lacrimal glands: Secondary | ICD-10-CM | POA: Diagnosis not present

## 2019-09-09 DIAGNOSIS — H02145 Spastic ectropion of left lower eyelid: Secondary | ICD-10-CM | POA: Diagnosis not present

## 2019-09-09 DIAGNOSIS — H02115 Cicatricial ectropion of left lower eyelid: Secondary | ICD-10-CM | POA: Diagnosis not present

## 2019-09-10 DIAGNOSIS — R1312 Dysphagia, oropharyngeal phase: Secondary | ICD-10-CM | POA: Diagnosis not present

## 2019-09-10 DIAGNOSIS — I5022 Chronic systolic (congestive) heart failure: Secondary | ICD-10-CM | POA: Diagnosis not present

## 2019-09-10 DIAGNOSIS — E11319 Type 2 diabetes mellitus with unspecified diabetic retinopathy without macular edema: Secondary | ICD-10-CM | POA: Diagnosis not present

## 2019-09-10 DIAGNOSIS — R338 Other retention of urine: Secondary | ICD-10-CM | POA: Diagnosis not present

## 2019-09-10 DIAGNOSIS — I130 Hypertensive heart and chronic kidney disease with heart failure and stage 1 through stage 4 chronic kidney disease, or unspecified chronic kidney disease: Secondary | ICD-10-CM | POA: Diagnosis not present

## 2019-09-10 DIAGNOSIS — N401 Enlarged prostate with lower urinary tract symptoms: Secondary | ICD-10-CM | POA: Diagnosis not present

## 2019-09-10 DIAGNOSIS — N183 Chronic kidney disease, stage 3 unspecified: Secondary | ICD-10-CM | POA: Diagnosis not present

## 2019-09-10 DIAGNOSIS — K5900 Constipation, unspecified: Secondary | ICD-10-CM | POA: Diagnosis not present

## 2019-09-10 DIAGNOSIS — E1122 Type 2 diabetes mellitus with diabetic chronic kidney disease: Secondary | ICD-10-CM | POA: Diagnosis not present

## 2019-09-11 ENCOUNTER — Encounter: Payer: Self-pay | Admitting: Dietician

## 2019-09-12 DIAGNOSIS — I5022 Chronic systolic (congestive) heart failure: Secondary | ICD-10-CM | POA: Diagnosis not present

## 2019-09-12 DIAGNOSIS — E1122 Type 2 diabetes mellitus with diabetic chronic kidney disease: Secondary | ICD-10-CM | POA: Diagnosis not present

## 2019-09-12 DIAGNOSIS — N183 Chronic kidney disease, stage 3 unspecified: Secondary | ICD-10-CM | POA: Diagnosis not present

## 2019-09-12 DIAGNOSIS — R1312 Dysphagia, oropharyngeal phase: Secondary | ICD-10-CM | POA: Diagnosis not present

## 2019-09-12 DIAGNOSIS — I130 Hypertensive heart and chronic kidney disease with heart failure and stage 1 through stage 4 chronic kidney disease, or unspecified chronic kidney disease: Secondary | ICD-10-CM | POA: Diagnosis not present

## 2019-09-12 DIAGNOSIS — E11319 Type 2 diabetes mellitus with unspecified diabetic retinopathy without macular edema: Secondary | ICD-10-CM | POA: Diagnosis not present

## 2019-09-12 DIAGNOSIS — K5900 Constipation, unspecified: Secondary | ICD-10-CM | POA: Diagnosis not present

## 2019-09-12 DIAGNOSIS — R338 Other retention of urine: Secondary | ICD-10-CM | POA: Diagnosis not present

## 2019-09-12 DIAGNOSIS — N401 Enlarged prostate with lower urinary tract symptoms: Secondary | ICD-10-CM | POA: Diagnosis not present

## 2019-09-15 ENCOUNTER — Encounter: Payer: Self-pay | Admitting: Student in an Organized Health Care Education/Training Program

## 2019-09-15 ENCOUNTER — Other Ambulatory Visit: Payer: Self-pay

## 2019-09-15 ENCOUNTER — Ambulatory Visit (INDEPENDENT_AMBULATORY_CARE_PROVIDER_SITE_OTHER): Payer: Medicare HMO | Admitting: Student in an Organized Health Care Education/Training Program

## 2019-09-15 VITALS — BP 111/54 | HR 61 | Temp 97.5°F | Ht 68.0 in | Wt 126.6 lb

## 2019-09-15 DIAGNOSIS — Z8674 Personal history of sudden cardiac arrest: Secondary | ICD-10-CM

## 2019-09-15 DIAGNOSIS — Z8701 Personal history of pneumonia (recurrent): Secondary | ICD-10-CM | POA: Diagnosis not present

## 2019-09-15 DIAGNOSIS — E1151 Type 2 diabetes mellitus with diabetic peripheral angiopathy without gangrene: Secondary | ICD-10-CM | POA: Diagnosis not present

## 2019-09-15 DIAGNOSIS — Z79899 Other long term (current) drug therapy: Secondary | ICD-10-CM

## 2019-09-15 DIAGNOSIS — I739 Peripheral vascular disease, unspecified: Secondary | ICD-10-CM | POA: Diagnosis not present

## 2019-09-15 DIAGNOSIS — Z7982 Long term (current) use of aspirin: Secondary | ICD-10-CM

## 2019-09-15 DIAGNOSIS — Z794 Long term (current) use of insulin: Secondary | ICD-10-CM

## 2019-09-15 DIAGNOSIS — Z95810 Presence of automatic (implantable) cardiac defibrillator: Secondary | ICD-10-CM | POA: Diagnosis not present

## 2019-09-15 DIAGNOSIS — R339 Retention of urine, unspecified: Secondary | ICD-10-CM | POA: Diagnosis not present

## 2019-09-15 DIAGNOSIS — I5022 Chronic systolic (congestive) heart failure: Secondary | ICD-10-CM

## 2019-09-15 DIAGNOSIS — E1169 Type 2 diabetes mellitus with other specified complication: Secondary | ICD-10-CM | POA: Diagnosis not present

## 2019-09-15 DIAGNOSIS — C329 Malignant neoplasm of larynx, unspecified: Secondary | ICD-10-CM

## 2019-09-15 LAB — POCT GLYCOSYLATED HEMOGLOBIN (HGB A1C): Hemoglobin A1C: 7 % — AB (ref 4.0–5.6)

## 2019-09-15 LAB — GLUCOSE, CAPILLARY: Glucose-Capillary: 108 mg/dL — ABNORMAL HIGH (ref 70–99)

## 2019-09-15 MED ORDER — ACCU-CHEK AVIVA PLUS VI STRP: ORAL_STRIP | 5 refills | Status: DC

## 2019-09-15 NOTE — Assessment & Plan Note (Signed)
Hemoglobin A1c is currently 7.0%.  Patient has a history of hypoglycemia requiring hospitalization.  Currently doing well on Lantus 8 units daily.  I refilled his test strips so he can continue checking blood glucose at home.  Given recent diagnosis of laryngeal cancer, I think his glucose is in the fine range and I will tolerate some higher readings.

## 2019-09-15 NOTE — Patient Instructions (Addendum)
Today we talked about the cancer that Dr. Redmond Baseman found in your throat.  You have a CT scan scheduled for Monday, December 28 at noon at [facility].  This will help with Dr. Redmond Baseman figure out the best treatment for this type of cancer, which may involve surgery or radiation.  In the meantime it will be important for you to eat as much as you can, build up your nutrition and your weight, so that you will be as strong as possible for future treatments.  Continue all your medications as you currently are.

## 2019-09-15 NOTE — Progress Notes (Signed)
Assessment and Plan:  See Encounters tab for problem-based medical decision making.   __________________________________________________________  HPI:   85 year old person here for follow-up of diabetes.  He has had an eventful last few months with at least 2 hospitalizations, 1 for aspiration pneumonia, another for acute renal injury due to bladder outlet obstruction.  He has been to skilled nursing facility for rehabilitation and is currently living at home independently by himself.  He relies on his sister for help with transportation.  He reports eating well at home, says he has plenty of food, also has nutritional supplements.  Denies any recent aspiration events.  Reports that his swallowing his voice have been much improved since he is surgery with Dr. Redmond Baseman.  We talked about his recent diagnosis of laryngeal cancer, it took quite a few minutes and several tries for him to engage with this discussion.  He has followed up with his urology doctor, reports having the Foley catheter removed 1 week ago and says he has been on an antibiotic.  He reports good adherence to his other medications without adverse side effects.  Denies any recent fevers, chills, chest pain, shortness of breath, or hypoglycemic symptoms.  He showed me his Lantus pen and how he takes 8 units once daily.  He asked me to refill his test strips so he can check blood sugars at home.  He denies any safety concerns at home.  Denies any recent falls.  He reports that he has enough support, he relies on his faith and trust in God for his provision.  __________________________________________________________  Problem List: Patient Active Problem List   Diagnosis Date Noted  . Laryngeal cancer ([place]) 07/10/2019    Priority: High  . Weight loss 12/11/2016    Priority: High  . Urinary retention 01/13/2016    Priority: High  . [name] (chronic kidney disease) stage 3, GFR 30-59 ml/min ([facility]) 12/04/2014    Priority: High  . Chronic  systolic congestive heart failure ([place]) 12/25/2007    Priority: High  . Type 2 diabetes mellitus with peripheral vascular disease ([place]) 01/01/1989    Priority: High  . Pharyngeal dysphagia 07/10/2019    Priority: Medium  . Dysphonia 07/10/2019    Priority: Medium  . Constipation 03/10/2009    Priority: Medium  . Coronary atherosclerosis 12/25/2007    Priority: Medium  . Enlarged prostate     Priority: Low  . Hypertensive retinopathy of both eyes 02/05/2018    Priority: Low  . Mild nonproliferative diabetic retinopathy of both eyes without macular edema associated with type 2 diabetes mellitus ([place]) 02/05/2018    Priority: Low  . Dry eye syndrome of both eyes 06/06/2017    Priority: Low  . Advanced care planning/counseling discussion 12/11/2016    Priority: Low  . Healthcare maintenance 11/22/2015    Priority: Low  . Automatic implantable cardioverter-defibrillator in situ 01/16/2012    Priority: Low  . Hyperlipidemia 01/02/2007    Priority: Low    Medications: Reconciled today in Epic __________________________________________________________  Physical Exam:  Vital Signs: Vitals:   09/15/19 0922  BP: (!) 111/54  Pulse: 61  Temp: (!) 97.5 F (36.4 C)  TempSrc: Oral  SpO2: 100%  Weight: 126 lb 9.6 oz (57.4 kg)  Height: 5\' 8"  (1.727 m)    Gen: Frail chronically ill-appearing man, no distress. Neck: Bilateral 1-2 cm cervical lymphadenopathy, right greater than left. CV: RRR, no murmurs Pulm: Normal effort, CTA throughout, no wheezing.  Ext: Warm, no edema, normal joints

## 2019-09-15 NOTE — Assessment & Plan Note (Signed)
Chronic heart failure with reduced ejection fraction to 30% and a history of V. fib arrest.  Seems well compensated today, euvolemic, okay functional status for his degree of frailty.  He has remote monitoring from his ICD with the heart failure clinic.  Continue medical management with furosemide 20 mg twice daily, carvedilol 6.25 mg twice daily, aspirin 81 mg daily, and atorvastatin 40 mg daily.

## 2019-09-15 NOTE — Assessment & Plan Note (Signed)
Patient with a recent episode of bladder outlet obstruction causing bilateral hydronephrosis and acute kidney injury.  He had a Foley catheter placed at [facility] and used a leg bag for several weeks afterwards.  Followed up with [facility] and had the catheter removed last week.  Currently on a course of Bactrim, though I do not have records about the reason.  On medical management with tamsulosin 0.8 mg daily and tolerating this well.

## 2019-09-15 NOTE — Assessment & Plan Note (Signed)
Patient was evaluated by [name] with ENT in October following a hospitalization for an aspiration pneumonia.  He was noted to have a hoarse voice.  Exam at that time showed a mass on his right vocal cord suspicious for cancer.  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  The patient has a CT scan of his head and neck scheduled for next week, and it seems based on these results [name] will discuss treatment options which may include surgery and/or radiation.  I spent about 15 minutes discussing these results with [name].  He tells me he wants to do as much treatment as will be offered to him.  Given his overall frailty and low functional state it seems he will be high risk for these interventions.

## 2019-09-16 DIAGNOSIS — K5900 Constipation, unspecified: Secondary | ICD-10-CM | POA: Diagnosis not present

## 2019-09-16 DIAGNOSIS — N401 Enlarged prostate with lower urinary tract symptoms: Secondary | ICD-10-CM | POA: Diagnosis not present

## 2019-09-16 DIAGNOSIS — R1312 Dysphagia, oropharyngeal phase: Secondary | ICD-10-CM | POA: Diagnosis not present

## 2019-09-16 DIAGNOSIS — E11319 Type 2 diabetes mellitus with unspecified diabetic retinopathy without macular edema: Secondary | ICD-10-CM | POA: Diagnosis not present

## 2019-09-16 DIAGNOSIS — I5022 Chronic systolic (congestive) heart failure: Secondary | ICD-10-CM | POA: Diagnosis not present

## 2019-09-16 DIAGNOSIS — E1122 Type 2 diabetes mellitus with diabetic chronic kidney disease: Secondary | ICD-10-CM | POA: Diagnosis not present

## 2019-09-16 DIAGNOSIS — N183 Chronic kidney disease, stage 3 unspecified: Secondary | ICD-10-CM | POA: Diagnosis not present

## 2019-09-16 DIAGNOSIS — I130 Hypertensive heart and chronic kidney disease with heart failure and stage 1 through stage 4 chronic kidney disease, or unspecified chronic kidney disease: Secondary | ICD-10-CM | POA: Diagnosis not present

## 2019-09-16 DIAGNOSIS — R338 Other retention of urine: Secondary | ICD-10-CM | POA: Diagnosis not present

## 2019-09-22 ENCOUNTER — Ambulatory Visit
Admission: RE | Admit: 2019-09-22 | Discharge: 2019-09-22 | Disposition: A | Discharge status: Home / Self Care | Payer: Medicare HMO | Source: Ambulatory Visit | Attending: Otolaryngology | Admitting: Otolaryngology

## 2019-09-22 DIAGNOSIS — J439 Emphysema, unspecified: Secondary | ICD-10-CM | POA: Diagnosis not present

## 2019-09-22 DIAGNOSIS — C329 Malignant neoplasm of larynx, unspecified: Secondary | ICD-10-CM | POA: Diagnosis not present

## 2019-09-22 MED ORDER — IOPAMIDOL (ISOVUE-300) INJECTION 61%
50.0000 mL | Freq: Once | INTRAVENOUS | Status: AC | PRN
  Administered 2019-09-22: 50 mL via INTRAVENOUS

## 2019-09-23 DIAGNOSIS — I130 Hypertensive heart and chronic kidney disease with heart failure and stage 1 through stage 4 chronic kidney disease, or unspecified chronic kidney disease: Secondary | ICD-10-CM | POA: Diagnosis not present

## 2019-09-23 DIAGNOSIS — E1122 Type 2 diabetes mellitus with diabetic chronic kidney disease: Secondary | ICD-10-CM | POA: Diagnosis not present

## 2019-09-23 DIAGNOSIS — N183 Chronic kidney disease, stage 3 unspecified: Secondary | ICD-10-CM | POA: Diagnosis not present

## 2019-09-23 DIAGNOSIS — N401 Enlarged prostate with lower urinary tract symptoms: Secondary | ICD-10-CM | POA: Diagnosis not present

## 2019-09-23 DIAGNOSIS — E11319 Type 2 diabetes mellitus with unspecified diabetic retinopathy without macular edema: Secondary | ICD-10-CM | POA: Diagnosis not present

## 2019-09-23 DIAGNOSIS — K5900 Constipation, unspecified: Secondary | ICD-10-CM | POA: Diagnosis not present

## 2019-09-23 DIAGNOSIS — R338 Other retention of urine: Secondary | ICD-10-CM | POA: Diagnosis not present

## 2019-09-23 DIAGNOSIS — R1312 Dysphagia, oropharyngeal phase: Secondary | ICD-10-CM | POA: Diagnosis not present

## 2019-09-23 DIAGNOSIS — I5022 Chronic systolic (congestive) heart failure: Secondary | ICD-10-CM | POA: Diagnosis not present

## 2019-09-25 DIAGNOSIS — E11319 Type 2 diabetes mellitus with unspecified diabetic retinopathy without macular edema: Secondary | ICD-10-CM | POA: Diagnosis not present

## 2019-09-25 DIAGNOSIS — N401 Enlarged prostate with lower urinary tract symptoms: Secondary | ICD-10-CM | POA: Diagnosis not present

## 2019-09-25 DIAGNOSIS — I130 Hypertensive heart and chronic kidney disease with heart failure and stage 1 through stage 4 chronic kidney disease, or unspecified chronic kidney disease: Secondary | ICD-10-CM | POA: Diagnosis not present

## 2019-09-25 DIAGNOSIS — R338 Other retention of urine: Secondary | ICD-10-CM | POA: Diagnosis not present

## 2019-09-25 DIAGNOSIS — I5022 Chronic systolic (congestive) heart failure: Secondary | ICD-10-CM | POA: Diagnosis not present

## 2019-09-25 DIAGNOSIS — K5900 Constipation, unspecified: Secondary | ICD-10-CM | POA: Diagnosis not present

## 2019-09-25 DIAGNOSIS — N183 Chronic kidney disease, stage 3 unspecified: Secondary | ICD-10-CM | POA: Diagnosis not present

## 2019-09-25 DIAGNOSIS — R1312 Dysphagia, oropharyngeal phase: Secondary | ICD-10-CM | POA: Diagnosis not present

## 2019-09-25 DIAGNOSIS — E1122 Type 2 diabetes mellitus with diabetic chronic kidney disease: Secondary | ICD-10-CM | POA: Diagnosis not present

## 2019-09-30 DIAGNOSIS — R1312 Dysphagia, oropharyngeal phase: Secondary | ICD-10-CM | POA: Diagnosis not present

## 2019-09-30 DIAGNOSIS — E11319 Type 2 diabetes mellitus with unspecified diabetic retinopathy without macular edema: Secondary | ICD-10-CM | POA: Diagnosis not present

## 2019-09-30 DIAGNOSIS — N183 Chronic kidney disease, stage 3 unspecified: Secondary | ICD-10-CM | POA: Diagnosis not present

## 2019-09-30 DIAGNOSIS — R338 Other retention of urine: Secondary | ICD-10-CM | POA: Diagnosis not present

## 2019-09-30 DIAGNOSIS — E1122 Type 2 diabetes mellitus with diabetic chronic kidney disease: Secondary | ICD-10-CM | POA: Diagnosis not present

## 2019-09-30 DIAGNOSIS — I5022 Chronic systolic (congestive) heart failure: Secondary | ICD-10-CM | POA: Diagnosis not present

## 2019-09-30 DIAGNOSIS — N401 Enlarged prostate with lower urinary tract symptoms: Secondary | ICD-10-CM | POA: Diagnosis not present

## 2019-09-30 DIAGNOSIS — K5900 Constipation, unspecified: Secondary | ICD-10-CM | POA: Diagnosis not present

## 2019-09-30 DIAGNOSIS — I130 Hypertensive heart and chronic kidney disease with heart failure and stage 1 through stage 4 chronic kidney disease, or unspecified chronic kidney disease: Secondary | ICD-10-CM | POA: Diagnosis not present

## 2019-10-01 DIAGNOSIS — E11319 Type 2 diabetes mellitus with unspecified diabetic retinopathy without macular edema: Secondary | ICD-10-CM | POA: Diagnosis not present

## 2019-10-01 DIAGNOSIS — N401 Enlarged prostate with lower urinary tract symptoms: Secondary | ICD-10-CM | POA: Diagnosis not present

## 2019-10-01 DIAGNOSIS — I5022 Chronic systolic (congestive) heart failure: Secondary | ICD-10-CM | POA: Diagnosis not present

## 2019-10-01 DIAGNOSIS — E1122 Type 2 diabetes mellitus with diabetic chronic kidney disease: Secondary | ICD-10-CM | POA: Diagnosis not present

## 2019-10-01 DIAGNOSIS — N183 Chronic kidney disease, stage 3 unspecified: Secondary | ICD-10-CM | POA: Diagnosis not present

## 2019-10-01 DIAGNOSIS — I130 Hypertensive heart and chronic kidney disease with heart failure and stage 1 through stage 4 chronic kidney disease, or unspecified chronic kidney disease: Secondary | ICD-10-CM | POA: Diagnosis not present

## 2019-10-01 DIAGNOSIS — K5900 Constipation, unspecified: Secondary | ICD-10-CM | POA: Diagnosis not present

## 2019-10-01 DIAGNOSIS — R338 Other retention of urine: Secondary | ICD-10-CM | POA: Diagnosis not present

## 2019-10-01 DIAGNOSIS — R1312 Dysphagia, oropharyngeal phase: Secondary | ICD-10-CM | POA: Diagnosis not present

## 2019-10-03 ENCOUNTER — Telehealth: Payer: Self-pay

## 2019-10-03 NOTE — Telephone Encounter (Signed)
I spoke with the pt to see if he was feeling okay because he sent an unscheduled transmission. The pt states he felt really good. I let him know his monitor is programmed to send automatically just as long he sleep near that monitor. He do not have to send a manual transmission unless we ask for one. The pt verbalized understanding and thanked me for the call.

## 2019-10-07 ENCOUNTER — Other Ambulatory Visit: Payer: Self-pay | Admitting: *Deleted

## 2019-10-07 ENCOUNTER — Telehealth: Payer: Self-pay | Admitting: *Deleted

## 2019-10-07 ENCOUNTER — Ambulatory Visit: Payer: Medicare HMO | Admitting: Podiatry

## 2019-10-07 DIAGNOSIS — E11319 Type 2 diabetes mellitus with unspecified diabetic retinopathy without macular edema: Secondary | ICD-10-CM | POA: Diagnosis not present

## 2019-10-07 DIAGNOSIS — I130 Hypertensive heart and chronic kidney disease with heart failure and stage 1 through stage 4 chronic kidney disease, or unspecified chronic kidney disease: Secondary | ICD-10-CM | POA: Diagnosis not present

## 2019-10-07 DIAGNOSIS — E1122 Type 2 diabetes mellitus with diabetic chronic kidney disease: Secondary | ICD-10-CM | POA: Diagnosis not present

## 2019-10-07 DIAGNOSIS — B309 Viral conjunctivitis, unspecified: Secondary | ICD-10-CM | POA: Diagnosis not present

## 2019-10-07 DIAGNOSIS — K5900 Constipation, unspecified: Secondary | ICD-10-CM | POA: Diagnosis not present

## 2019-10-07 DIAGNOSIS — R1312 Dysphagia, oropharyngeal phase: Secondary | ICD-10-CM | POA: Diagnosis not present

## 2019-10-07 DIAGNOSIS — N401 Enlarged prostate with lower urinary tract symptoms: Secondary | ICD-10-CM | POA: Diagnosis not present

## 2019-10-07 DIAGNOSIS — H35033 Hypertensive retinopathy, bilateral: Secondary | ICD-10-CM | POA: Diagnosis not present

## 2019-10-07 DIAGNOSIS — E113293 Type 2 diabetes mellitus with mild nonproliferative diabetic retinopathy without macular edema, bilateral: Secondary | ICD-10-CM | POA: Diagnosis not present

## 2019-10-07 DIAGNOSIS — N183 Chronic kidney disease, stage 3 unspecified: Secondary | ICD-10-CM | POA: Diagnosis not present

## 2019-10-07 DIAGNOSIS — Z961 Presence of intraocular lens: Secondary | ICD-10-CM | POA: Diagnosis not present

## 2019-10-07 DIAGNOSIS — I5022 Chronic systolic (congestive) heart failure: Secondary | ICD-10-CM | POA: Diagnosis not present

## 2019-10-07 DIAGNOSIS — R338 Other retention of urine: Secondary | ICD-10-CM | POA: Diagnosis not present

## 2019-10-07 MED ORDER — PANTOPRAZOLE SODIUM 40 MG PO TBEC: 40.0000 mg | DELAYED_RELEASE_TABLET | Freq: Every day | ORAL | 0 refills | Status: DC

## 2019-10-07 NOTE — Telephone Encounter (Signed)
Oncology Nurse Navigator Documentation  Placed New Referral introductory call to [name] in follow-up to yesterday afternoon's attempt.  Again, no answer, unable to LVMM. Called his sister [name] (listed as Patient Contact).   She provided alternate phone number for her brother.   She indicated awareness of brother's referral, I provided her Friday's teleconsult appt time for her to relay to brother in the event I am unable to reach him.  She stated he lives alone.  She agreed to participate in Friday's meeting. Called [name] at alternate phone #, LVMM asking for return call.  Gayleen Orem, RN, BSN Head & Neck Oncology Nurse [place] at [place] [phone]

## 2019-10-08 ENCOUNTER — Telehealth: Payer: Self-pay | Admitting: *Deleted

## 2019-10-08 NOTE — Telephone Encounter (Addendum)
Oncology Nurse Navigator Documentation  Placed introductory call to new referral patient [name].  He had me speak with his son [name] d/t his extreme hoarseness.  Introduced myself as the H&N oncology nurse navigator that works with Dr. Isidore Moos to whom he has been referred by Dr.Bates.  He confirmed understanding of referral and telephone consult scheduled for Friday beginning at 7:30.  Son indicated pt's sister [name] will join him.  Briefly explained my role as his navigator, provided my contact information.   I explained the purpose of a dental evaluation prior to starting RT, indicated he may be contacted by [facility] DM to arrange an appt pending discussion on Friday.    I encouraged them to call me with questions/concerns as he moves forward with appts and procedures.    Son verbalized understanding of information provided, expressed appreciation for my call.  Navigator Initial Assessment . Employment Status: retured . Currently on FMLA / STD: no . Living Situation: lives alone . Support System: son, SO, sister . PCP: Lalla Brothers, MD . PCD: yes . Financial Concerns: . Transportation Needs: yes when family not available . Sensory Deficits: no . Language Barriers/Interpreter Needed:  no . Ambulation Needs: yes . DME Used in Home: cane . Psychosocial Needs:  no . Concerns/Needs Understanding Cancer:  addressed/answered by navigator to best of ability . Self-Expressed Needs: no  Gayleen Orem, RN, BSN Head & Neck Oncology Nurse [place] at [place] [phone]

## 2019-10-09 DIAGNOSIS — E1122 Type 2 diabetes mellitus with diabetic chronic kidney disease: Secondary | ICD-10-CM | POA: Diagnosis not present

## 2019-10-09 DIAGNOSIS — E11319 Type 2 diabetes mellitus with unspecified diabetic retinopathy without macular edema: Secondary | ICD-10-CM | POA: Diagnosis not present

## 2019-10-09 DIAGNOSIS — I5022 Chronic systolic (congestive) heart failure: Secondary | ICD-10-CM | POA: Diagnosis not present

## 2019-10-09 DIAGNOSIS — N401 Enlarged prostate with lower urinary tract symptoms: Secondary | ICD-10-CM | POA: Diagnosis not present

## 2019-10-09 DIAGNOSIS — R338 Other retention of urine: Secondary | ICD-10-CM | POA: Diagnosis not present

## 2019-10-09 DIAGNOSIS — R1312 Dysphagia, oropharyngeal phase: Secondary | ICD-10-CM | POA: Diagnosis not present

## 2019-10-09 DIAGNOSIS — I130 Hypertensive heart and chronic kidney disease with heart failure and stage 1 through stage 4 chronic kidney disease, or unspecified chronic kidney disease: Secondary | ICD-10-CM | POA: Diagnosis not present

## 2019-10-09 DIAGNOSIS — K5900 Constipation, unspecified: Secondary | ICD-10-CM | POA: Diagnosis not present

## 2019-10-09 DIAGNOSIS — N183 Chronic kidney disease, stage 3 unspecified: Secondary | ICD-10-CM | POA: Diagnosis not present

## 2019-10-09 NOTE — Progress Notes (Signed)
Radiation Oncology         (336) [phone] ________________________________  Initial outpatient Consultation (by telephone as patient was unable to access MyChart video during pandemic precautions; spoke w/ sister [name] and patient @ [phone])  Name: [name] MRN: [id number]  Date: 10/10/2019  DOB: Jun 11, 1931  TK:[id number], Mallie Mussel, MD  Melida Quitter, MD   REFERRING PHYSICIAN: Melida Quitter, MD  DIAGNOSIS:    ICD-10-CM   1. Malignant neoplasm of glottis ([place])  C32.0 Amb Referral to Nutrition and Diabetic E    Referral to Neuro Rehab    Ambulatory referral to Social Work   Cancer Staging Malignant neoplasm of glottis [facility]) Staging form: Larynx - Glottis, AJCC 8th Edition - Clinical stage from 10/10/2019: Stage II (cT2, cN0, cM0) - Signed by Eppie Gibson, MD on 10/10/2019   CHIEF COMPLAINT: Here to discuss management of laryngeal cancer  HISTORY OF PRESENT ILLNESS::[name] is a 86 y.o. male who presented with hoarseness and difficulty swallowing. He was initially taken to the ED following an episode of hypoglycemia. Chest x-ray performed showed pneumonia, and he was admitted to the hospital in late August 2020. His pneumonia was thought to be aspiration-related, and thus he worked with speech pathology as outpatient.  Subsequently, the patient saw Dr. Redmond Baseman who performed in-office laryngoscopy on 07/10/2019. This showed an irregular right vocal cord with white, bulky appearance. Right hemilarynx was not moving well.  Biopsy was delayed due to unrelated medical issues. Microdirect laryngoscopy with biopsy of the right vocal cord on 08/15/2019 revealed: invasive well-differentiated squamous cell carcinoma.  Pertinent imaging thus far includes neck CT performed on 09/22/2019 revealing: 1.6 cm enhancing mass centered within the true vocal cord, extending to the anterior commissure and superiorly to the laryngeal ventricle; no pathologically enlarged cervical  chain lymph nodes; 1.6 cm sialolith within left submandibular duct. Chest CT performed the same day showed no evidence of metastatic disease.   Swallowing issues, if any:  Swallows soft foods, received meals on wheels. Pureed foods. Thickened liquids.  Weight Changes: He states he is 140lb today. Normal weight for him is 180lb; he feels the weight loss is due to swallowing issue and PNA.  Eating has improved. Wt Readings from Last 3 Encounters:  09/15/19 126 lb 9.6 oz (57.4 kg)  08/15/19 119 lb (54 kg)  07/29/19 116 lb 6.5 oz (52.8 kg)   Pain status: No pain.  Tobacco history, if any: yes, quit in 1963  ETOH abuse, if any: none  Prior cancers, if any: none  FINDINGS per Dr Redmond Baseman at time of biopsy / EUA:  The entire right vocal fold is irregular with a whitish mass appearance.  This also involves the anterior commissure, but does not extend on to the left vocal cord.  The abnormal tissue extends laterally into the ventricle out of view.  There  were no other lesions   I spoken with Dr. Redmond Baseman personally and he feels that the degree of impaired mobility of the vocal cord qualifies this is a T2N0 cancer rather than T3N0.  He had a Pacemaker, managed by Dr. Lovena Le [facility], [address]  Lives alone.   PREVIOUS RADIATION THERAPY: No  PAST MEDICAL HISTORY:  has a past medical history of Adenomatous colon polyp (02/14/2012), AICD (automatic cardioverter/defibrillator) present, BBB (bundle branch block), Carotid stenosis, Chronic kidney disease, Chronic systolic CHF (congestive heart failure) ([place]), Elevated PSA, History of kidney stones, HTN (hypertension), Hyperlipidemia, ICD (implantable cardiac defibrillator) in place (12-25-2012), Myocardial  infarction ([place]) (1990), Pericardial effusion, Pneumonia, Presence of permanent cardiac pacemaker, PVD (peripheral vascular disease) ([place]), Transient ischemic attack, and Type II diabetes mellitus ([place]).    PAST SURGICAL HISTORY: Past Surgical  History:  Procedure Laterality Date   BI-VENTRICULAR IMPLANTABLE CARDIOVERTER DEFIBRILLATOR UPGRADE N/A 12/25/2012   Procedure: BI-VENTRICULAR IMPLANTABLE CARDIOVERTER DEFIBRILLATOR UPGRADE;  Surgeon: Evans Lance, MD;  Location[facility] CATH LAB;  Service: Cardiovascular;  Laterality: N/A;   BIV ICD UPGRADE  12/25/2012   MDT CRTD upgrade by Dr Lovena Le for ischemic cardiomyopathy and worsening conduction system disease   CARDIAC CATHETERIZATION  06/2003,  01/2004   CARDIAC CATHETERIZATION N/A 01/13/2016   Procedure: Left Heart Cath and Coronary Angiography;  Surgeon: Jettie Booze, MD;  Location: [place] CV LAB;  Service: Cardiovascular;  Laterality: N/A;   CARDIAC DEFIBRILLATOR PLACEMENT  05/24/2004   Implantation of a MDT single-chamber defibrillator   CAROTID STENT  09/11/2003   Percutaneous transluminal angioplasty and stent placement of the left internal carotid artery.   CATARACT EXTRACTION W/ INTRAOCULAR LENS  IMPLANT, BILATERAL Bilateral ~ 2010   [place]   "2" (12/25/2012)   CYSTOSCOPY     DIRECT LARYNGOSCOPY N/A 08/15/2019   Procedure: MICRODIRECT LARYNGOSCOPY WITH BIOPSY;  Surgeon: Melida Quitter, MD;  Location: [facility];  Service: ENT;  Laterality: N/A;   INSERT / REPLACE / REMOVE PACEMAKER     LEAD REVISION N/A 12/25/2012   Procedure: LEAD REVISION;  Surgeon: Evans Lance, MD;  Location: [facility] CATH LAB;  Service: Cardiovascular;  Laterality: N/A;   RIGID ESOPHAGOSCOPY N/A 08/15/2019   Procedure: RIGID ESOPHAGOSCOPY;  Surgeon: Melida Quitter, MD;  Location: [facility] OR;  Service: ENT;  Laterality: N/A;    FAMILY HISTORY: family history includes Diabetes in his brother and mother; Healthy in his father.  SOCIAL HISTORY:  reports that he quit smoking about 51 years ago. His smoking use included cigarettes. He has never used smokeless tobacco. He reports that he does not drink alcohol or use drugs.  ALLERGIES: Patient has no known  allergies.  MEDICATIONS:  Current Outpatient Medications  Medication Sig Dispense Refill   alum & mag hydroxide-simeth (MAALOX/MYLANTA) 200-200-20 MG/5ML suspension Take 15 mLs by mouth every 6 (six) hours as needed for indigestion or heartburn. 355 mL 0   aspirin EC 81 MG tablet Take 81 mg by mouth daily.     atorvastatin (LIPITOR) 40 MG tablet TAKE 1 TABLET(40 MG) BY MOUTH DAILY AT 6 PM (Patient taking differently: Take 40 mg by mouth daily at 6 PM. ) 90 tablet 3   bisacodyl (DULCOLAX) 5 MG EC tablet Take 2 tablets (10 mg total) by mouth daily. 30 tablet 1   Blood Glucose Monitoring Suppl (ACCU-CHEK AVIVA PLUS) w/Device KIT Check finger stick glucose once daily 1 kit 0   carvedilol (COREG) 6.25 MG tablet Take 1 tablet (6.25 mg total) by mouth 2 (two) times daily with a meal. 60 tablet 1   furosemide (LASIX) 40 MG tablet Take 1 tablet (40 mg total) by mouth 2 (two) times daily. (Patient taking differently: Take 20 mg by mouth 2 (two) times daily. ) 180 tablet 3   glucose blood (ACCU-CHEK AVIVA PLUS) test strip Check blood sugar up to 3 times a day 300 each 5   Hydrocortisone, Perianal, (PROCTO-PAK) 1 % CREA Apply 1 application topically 2 (two) times daily. 28 g 0   insulin glargine (LANTUS) 100 UNIT/ML injection Inject 0.07 mLs (7 Units total) into the skin  daily. 10 mL 11   Insulin Pen Needle (B-D ULTRAFINE III SHORT PEN) 31G X 8 MM MISC USE ONCE DAILY WITH LANTUS PEN 100 each 3   lidocaine (XYLOCAINE) 2 % jelly Apply 1 application topically as needed. (Patient taking differently: Apply 1 application topically as needed (wound). ) 30 mL 0   pantoprazole (PROTONIX) 40 MG tablet Take 1 tablet (40 mg total) by mouth daily. 90 tablet 0   polyethylene glycol (MIRALAX / GLYCOLAX) 17 g packet Take 17 g by mouth daily. 14 each 0   senna-docusate (SENOKOT-S) 8.6-50 MG tablet Take 2 tablets by mouth daily. (Patient taking differently: Take 2 tablets by mouth daily as needed for mild  constipation. ) 30 tablet 2   sulfamethoxazole-trimethoprim (BACTRIM DS) 800-160 MG tablet      tamsulosin (FLOMAX) 0.4 MG CAPS capsule Take 0.8 mg by mouth at bedtime.      VENTOLIN HFA 108 (90 Base) MCG/ACT inhaler INHALE 2 PUFFS INTO THE LUNGS EVERY 6 HOURS AS NEEDED FOR WHEEZING OR SHORTNESS OF BREATH (Patient taking differently: Inhale 2 puffs into the lungs every 6 (six) hours as needed for wheezing or shortness of breath. ) 18 g 5   No current facility-administered medications for this encounter.    REVIEW OF SYSTEMS:  Notable for that above.   PHYSICAL EXAM:  vitals were not taken for this visit.   General: Alert and oriented, in no acute distress - hoarse   LABORATORY DATA:  Lab Results  Component Value Date   WBC 9.6 08/15/2019   HGB 11.5 (L) 08/15/2019   HCT 36.7 (L) 08/15/2019   MCV 93.4 08/15/2019   PLT 273 08/15/2019   CMP     Component Value Date/Time   NA 140 08/15/2019 0851   NA 141 05/06/2018 1206   K 4.5 08/15/2019 0851   CL 104 08/15/2019 0851   CO2 25 08/15/2019 0851   GLUCOSE 137 (H) 08/15/2019 0851   BUN 37 (H) 08/15/2019 0851   BUN 28 (H) 05/06/2018 1206   CREATININE 1.92 (H) 08/15/2019 0851   CREATININE 1.77 (H) 04/14/2015 1434   CALCIUM 9.7 08/15/2019 0851   PROT 7.7 05/20/2019 1955   PROT 6.8 12/11/2016 0915   ALBUMIN 3.7 05/20/2019 1955   ALBUMIN 3.5 12/11/2016 0915   AST 32 05/20/2019 1955   ALT 14 05/20/2019 1955   ALKPHOS 55 05/20/2019 1955   BILITOT 0.8 05/20/2019 1955   BILITOT 0.3 12/11/2016 0915   GFRNONAA 31 (L) 08/15/2019 0851   GFRNONAA 35 (L) 04/14/2015 1434   GFRAA 36 (L) 08/15/2019 0851   GFRAA 40 (L) 04/14/2015 1434      Lab Results  Component Value Date   TSH 0.986 05/21/2019     RADIOGRAPHY: CT SOFT TISSUE NECK W CONTRAST  Result Date: 09/22/2019 CLINICAL DATA:  Laryngeal cancer. Additional history provided: Difficulty swallowing, hoarseness for 7 months, laryngoscopy November 2020 with new diagnosis of  squamous cell carcinoma. EXAM: CT NECK WITH CONTRAST TECHNIQUE: Multidetector CT imaging of the neck was performed using the standard protocol following the bolus administration of intravenous contrast. CONTRAST:  39m ISOVUE-300 IOPAMIDOL (ISOVUE-300) INJECTION 61% COMPARISON:  MRA neck 07/13/2003 FINDINGS: Pharynx and larynx: No appreciable mass or swelling within the oral cavity or pharynx. There is an enhancing mass centered within the right true vocal cord most prominent anteriorly, extending to the anterior commissure. There is also extension of the mass superiorly into the right laryngeal ventricle (see series 11, images 77 and 78). The  mass measures 1.6 x 1.1 cm in transaxial dimensions. Mild effacement of the right aspect of the laryngeal airway. Salivary glands: Large sialolith along the course of the distal left submandibular duct, measuring 1.7 x 0.8 cm (series 11, image 40). The bilateral parotid and right submandibular glands are unremarkable. Thyroid: Negative Lymph nodes: No pathologically enlarged cervical chain lymph nodes. Vascular: Patent carotid artery stent within the left neck. Prominent mixed atherosclerotic plaque at the carotid bifurcation and within the proximal right ICA. 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 Limited intracranial: No abnormality identified. Visualized orbits: Not included in the field of view. Mastoids and visualized paranasal sinuses: No significant paranasal sinus disease or mastoid effusion at the imaged levels. Skeleton: No acute bony abnormality. Cervical spondylosis without high-grade bony spinal canal narrowing. Upper chest: Please refer to same day chest CT for a description of findings below the level of the thoracic inlet. Partially imaged left chest AICD device. Patulous visualized esophagus. IMPRESSION: 1.6 x 1.1 cm enhancing mass centered within the right true vocal cord, extending to the anterior commissure, also extending superiorly into the  laryngeal ventricle. No pathologically enlarged cervical chain lymph nodes. 1.6 cm sialolith within the left submandibular duct. Prominent atherosclerotic disease at the carotid bifurcation and within the proximal right ICA. 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 Please refer to findings on chest CT concurrently performed and separately reported. Electronically Signed   By: Kellie Simmering DO   On: 09/22/2019 13:13   CT CHEST W CONTRAST  Result Date: 09/22/2019 CLINICAL DATA:  Laryngeal cancer, difficulty swallowing, hoarseness. EXAM: CT CHEST WITH CONTRAST TECHNIQUE: Multidetector CT imaging of the chest was performed during intravenous contrast administration. CONTRAST:  37m ISOVUE-300 IOPAMIDOL (ISOVUE-300) INJECTION 61% COMPARISON:  04/23/2017. FINDINGS: Cardiovascular: Atherosclerotic calcification of the aorta, aortic valve and coronary arteries. Heart is enlarged with left ventricular dilatation. Posterior pericardial fluid appears stable from 04/23/2017. Mediastinum/Nodes: No pathologically enlarged mediastinal, hilar or axillary lymph nodes. Esophagus is somewhat dilated. Asymmetric nodular thickening of the right vocal cords is noted. Lungs/Pleura: Biapical pleuroparenchymal scarring. Calcified granulomas. Centrilobular and paraseptal emphysema. Mild volume loss and scarring in the posterior left lower lobe. No pleural fluid. Airway is unremarkable. Upper Abdomen: Patient's arms create streak artifact in the upper abdomen. Visualized portions of the liver, adrenal glands and right kidney are unremarkable. Partially imaged low-attenuation lesion in the upper pole left kidney has associated calcification. Visualized portions of the spleen, stomach and bowel are grossly unremarkable. Musculoskeletal: Degenerative changes in the spine. IMPRESSION: 1. No evidence of metastatic disease. Asymmetric right vocal cord thickening, better seen and discussed on dedicated CT neck done the same day. 2.  Aortic atherosclerosis (ICD10-I70.0). Coronary artery calcification. 3.  Emphysema (ICD10-J43.9). Electronically Signed   By: MLorin PicketM.D.   On: 09/22/2019 15:53      IMPRESSION/PLAN:  This is a delightful patient with head and neck cancer. Does not want surgery; I recommend radiotherapy for this patient.  We discussed the potential risks, benefits, and side effects of radiotherapy.  We discussed standard versus hypofractionation.  Given his   comorbidities and the risks of the Covid pandemic, I think that a 4-week hypofractionated regimen is the best option for him.  We discussed the risks and benefits of this.  He is enthusiastic about a 4-week course which would entail 54 Gy in 20 fractions to the larynx.  We talked in detail about acute and late effects. We discussed that some of the most bothersome acute effects may  be mucositis, salivary changes, skin irritation, skin peeling, dehydration, weight loss and fatigue. We talked about late effects which include but are not necessarily limited to dysphagia, hypothyroidism, laryngeal necrosis, feeding tube dependence. No guarantees of treatment were given. The patient is enthusiastic about proceeding with treatment. I look forward to participating in the patient's care.    Simulation (treatment planning) will take place next week  We also discussed that the treatment of head and neck cancer is a multidisciplinary process to maximize treatment outcomes and quality of life. For this reason the following referrals have been or will be made:    Nutritionist for nutrition support during and after treatment.   Speech language pathology for swallowing and/or speech therapy.   Social work for social support.  (Transportation needs).   We discussed measures to reduce the risk of infection during the COVID-19 pandemic. I recommend he strongly consider getting the vaccine.  COVID-19 Vaccine Information can be found at:  ShippingScam.co.uk For questions related to vaccine distribution or appointments, please email vaccine_0 .com or call 402 843 7181.   Nursing will interface with cardiology regarding pacemaker device.  This encounter was provided by telemedicine platform by telephone as patient was unable to access MyChart video during pandemic precautions The patient has given verbal consent for this type of encounter and has been advised to only accept a meeting of this type in a secure network environment. The time spent during this encounter was 60 minutes in total on date of service. The attendants for this meeting include Eppie Gibson  and [name]. Gayleen Orem, RN, our Head and Neck Oncology Navigator and the patient's sister [name] were present too. During the encounter, Eppie Gibson was located at [facility] Radiation Oncology Department.  [name] was located at home.   __________________________________________   Eppie Gibson, MD   This document serves as a record of services personally performed by Eppie Gibson, MD. It was created on her behalf by Wilburn Mylar, a trained medical scribe. The creation of this record is based on the scribe's personal observations and the provider's statements to them. This document has been checked and approved by the attending provider.

## 2019-10-10 ENCOUNTER — Encounter: Payer: Self-pay | Admitting: Radiation Oncology

## 2019-10-10 ENCOUNTER — Ambulatory Visit
Admission: RE | Admit: 2019-10-10 | Discharge: 2019-10-10 | Disposition: A | Discharge status: Home / Self Care | Payer: Medicare HMO | Source: Ambulatory Visit | Attending: Radiation Oncology | Admitting: Radiation Oncology

## 2019-10-10 DIAGNOSIS — C320 Malignant neoplasm of glottis: Secondary | ICD-10-CM | POA: Diagnosis not present

## 2019-10-10 DIAGNOSIS — Z510 Encounter for antineoplastic radiation therapy: Secondary | ICD-10-CM | POA: Insufficient documentation

## 2019-10-10 DIAGNOSIS — Z87891 Personal history of nicotine dependence: Secondary | ICD-10-CM | POA: Diagnosis not present

## 2019-10-10 NOTE — Progress Notes (Signed)
Patient not seen by nursing

## 2019-10-10 NOTE — Addendum Note (Signed)
Encounter addended by: Eppie Gibson, MD on: 10/10/2019 9:04 AM  Actions taken: Flowsheet accepted

## 2019-10-11 ENCOUNTER — Encounter: Payer: Self-pay | Admitting: *Deleted

## 2019-10-11 NOTE — Progress Notes (Signed)
ICD remote 

## 2019-10-11 NOTE — Progress Notes (Signed)
Oncology Nurse Navigator Documentation  Joined [name] for his Teleconsult with Dr. Isidore Moos.  He was joined by his sister [name]. They voiced understanding:  Treatment of his VC carcinoma will consist of RT for 4 weeks, M-F.  Next step is CT SIM, they will be called with appt for next week.  Referrals by Dr. Isidore Moos to SLP, Nutrition and SW. Of note:  Receiving PT with home health [facility].  Receiving Meals on Wheels with Monday delivery of daily meal for the week.  Will need transportation support when tmts begin.  They voiced understanding I will coordinate. They agreed to call me with needs/concerns.  Gayleen Orem, RN, BSN Head & Neck Oncology Nurse [place] at [place] [phone]

## 2019-10-13 ENCOUNTER — Telehealth: Payer: Self-pay

## 2019-10-13 ENCOUNTER — Ambulatory Visit (INDEPENDENT_AMBULATORY_CARE_PROVIDER_SITE_OTHER): Payer: Medicare HMO

## 2019-10-13 ENCOUNTER — Telehealth: Payer: Self-pay | Admitting: Radiation Oncology

## 2019-10-13 DIAGNOSIS — Z95810 Presence of automatic (implantable) cardiac defibrillator: Secondary | ICD-10-CM

## 2019-10-13 DIAGNOSIS — I5022 Chronic systolic (congestive) heart failure: Secondary | ICD-10-CM | POA: Diagnosis not present

## 2019-10-13 NOTE — Progress Notes (Signed)
EPIC Encounter for ICM Monitoring  Patient Name: [name] is a 86 y.o. male Date: 10/13/2019 Primary Care Physican: Axel Filler, MD Primary Cardiologist:Taylor Electrophysiologist: Santina Evans Pacing: 99.8% 10/13/2019 Weight:140lbs  Spoke with patient.  He reports feeling fine and not experiencing any fluid symptoms.  Patient will start treatment for laryngeal cancer in the next couple of weeks.  OptivolThoracic impedancesuggestive of possible ongoing fluid accumulation since 09/22/2019.  Prescribed:Furosemide 40 mg 1 tablet twice a day.   Labs: 08/15/2019 Creatinine 1.92, BUN 37, Potassium 4.5, Sodium 140, GFR 31-36  07/31/2019 Creatinine 1.54, BUN 23, Potassium 4.6, Sodium 134, GFR 40-47  07/30/2019 Creatinine 1.79, BUN 28, Potassium 4.8, Sodium 133, GFR 34-39  07/29/2019 Creatinine 2.22, BUN 39, Potassium 4.1, Sodium 140, GFR 26-30  A complete set of results can be found in Results Review.  Recommendations: No changes and encouraged to call if experiencing any fluid symptoms.  Follow-up plan: ICM clinic phone appointment on 10/24/2019 to recheck fluid levels.   91 day device clinic remote transmission 12/03/2019.   Copy of ICM check sent to Dr. Lovena Le.   3 month ICM trend: 10/13/2019    1 Year ICM trend:       [name], RN 10/13/2019 10:27 AM

## 2019-10-13 NOTE — Telephone Encounter (Signed)
Scheduled per 1/15 sch msg. Called and left msg. Mailing printout  

## 2019-10-13 NOTE — Telephone Encounter (Signed)
Remote ICM transmission received.  Attempted call to patient regarding ICM remote transmission and patient unable to hear conversation.

## 2019-10-15 ENCOUNTER — Ambulatory Visit
Admission: RE | Admit: 2019-10-15 | Discharge: 2019-10-15 | Disposition: A | Discharge status: Home / Self Care | Payer: Medicare HMO | Source: Ambulatory Visit | Attending: Radiation Oncology | Admitting: Radiation Oncology

## 2019-10-15 ENCOUNTER — Encounter: Payer: Self-pay | Admitting: *Deleted

## 2019-10-15 ENCOUNTER — Other Ambulatory Visit: Payer: Self-pay

## 2019-10-15 DIAGNOSIS — C320 Malignant neoplasm of glottis: Secondary | ICD-10-CM | POA: Diagnosis not present

## 2019-10-15 DIAGNOSIS — Z510 Encounter for antineoplastic radiation therapy: Secondary | ICD-10-CM | POA: Diagnosis not present

## 2019-10-15 NOTE — Addendum Note (Signed)
Encounter addended by: Eppie Gibson, MD on: 10/15/2019 1:47 PM  Actions taken: Clinical Note Signed

## 2019-10-15 NOTE — Progress Notes (Signed)
  Radiation Oncology         [phone]) [phone] ________________________________  Name: [name] MRN: [id number]  Date: 10/15/2019  DOB: 09/08/1931  SIMULATION AND TREATMENT PLANNING NOTE  Outpatient    ICD-10-CM   1. Malignant neoplasm of glottis ([place])  C32.0     NARRATIVE:  The patient was brought to the [facility].  Identity was confirmed.  All relevant records and images related to the planned course of therapy were reviewed.  The patient freely provided informed written consent to proceed with treatment after reviewing the details related to the planned course of therapy. The consent form was witnessed and verified by the simulation staff.    Then, the patient was set-up in a stable reproducible supine position for radiation therapy.  Aquaplast head and should mask was custom fabricated for immobilization.  CT images were obtained without contrast.  Surface markings were placed.  The CT images were loaded into the planning software.    TREATMENT PLANNING NOTE: Treatment planning then occurred.  The radiation prescription was entered and confirmed.    A total of 3 medically necessary complex treatment devices were fabricated and supervised by me (2 wedges for the opposed fields and the Aquaplast head and shoulder mask). I have requested : 3D Simulation  I have requested a DVH of the following structures: target volume, esophagus, spinal cord.  I have ordered:Nutrition Consult  The patient will receive 54 Gy in 20 fractions to the larynx.  -----------------------------------  Eppie Gibson, MD

## 2019-10-16 DIAGNOSIS — C320 Malignant neoplasm of glottis: Secondary | ICD-10-CM | POA: Diagnosis not present

## 2019-10-16 DIAGNOSIS — K5900 Constipation, unspecified: Secondary | ICD-10-CM | POA: Diagnosis not present

## 2019-10-16 DIAGNOSIS — N401 Enlarged prostate with lower urinary tract symptoms: Secondary | ICD-10-CM | POA: Diagnosis not present

## 2019-10-16 DIAGNOSIS — N183 Chronic kidney disease, stage 3 unspecified: Secondary | ICD-10-CM | POA: Diagnosis not present

## 2019-10-16 DIAGNOSIS — R1312 Dysphagia, oropharyngeal phase: Secondary | ICD-10-CM | POA: Diagnosis not present

## 2019-10-16 DIAGNOSIS — E1122 Type 2 diabetes mellitus with diabetic chronic kidney disease: Secondary | ICD-10-CM | POA: Diagnosis not present

## 2019-10-16 DIAGNOSIS — R338 Other retention of urine: Secondary | ICD-10-CM | POA: Diagnosis not present

## 2019-10-16 DIAGNOSIS — I5022 Chronic systolic (congestive) heart failure: Secondary | ICD-10-CM | POA: Diagnosis not present

## 2019-10-16 DIAGNOSIS — I130 Hypertensive heart and chronic kidney disease with heart failure and stage 1 through stage 4 chronic kidney disease, or unspecified chronic kidney disease: Secondary | ICD-10-CM | POA: Diagnosis not present

## 2019-10-17 DIAGNOSIS — Z510 Encounter for antineoplastic radiation therapy: Secondary | ICD-10-CM | POA: Diagnosis not present

## 2019-10-17 DIAGNOSIS — C320 Malignant neoplasm of glottis: Secondary | ICD-10-CM | POA: Diagnosis not present

## 2019-10-18 NOTE — Progress Notes (Signed)
Oncology Nurse Navigator Documentation  To provide support, encouragement and care continuity, met with [name] during his CT [facility].   He tolerated procedure without difficulty, denied questions/concerns.    I toured him to [facility] 2 treatment area, explained procedures for lobby registration, arrival to Radiation Waiting, arrival to tmt area and preparation for tmt.  He voiced understanding.    I encouraged him to call me with questions/concerns prior to 1/27 New Start.  Gayleen Orem, RN, BSN Head & Neck Oncology Nurse [place] at [place] [phone]

## 2019-10-20 ENCOUNTER — Telehealth: Payer: Self-pay | Admitting: *Deleted

## 2019-10-20 DIAGNOSIS — I130 Hypertensive heart and chronic kidney disease with heart failure and stage 1 through stage 4 chronic kidney disease, or unspecified chronic kidney disease: Secondary | ICD-10-CM | POA: Diagnosis not present

## 2019-10-20 DIAGNOSIS — N401 Enlarged prostate with lower urinary tract symptoms: Secondary | ICD-10-CM | POA: Diagnosis not present

## 2019-10-20 DIAGNOSIS — N183 Chronic kidney disease, stage 3 unspecified: Secondary | ICD-10-CM | POA: Diagnosis not present

## 2019-10-20 DIAGNOSIS — K5900 Constipation, unspecified: Secondary | ICD-10-CM | POA: Diagnosis not present

## 2019-10-20 DIAGNOSIS — C320 Malignant neoplasm of glottis: Secondary | ICD-10-CM | POA: Diagnosis not present

## 2019-10-20 DIAGNOSIS — E1122 Type 2 diabetes mellitus with diabetic chronic kidney disease: Secondary | ICD-10-CM | POA: Diagnosis not present

## 2019-10-20 DIAGNOSIS — I5022 Chronic systolic (congestive) heart failure: Secondary | ICD-10-CM | POA: Diagnosis not present

## 2019-10-20 DIAGNOSIS — R1312 Dysphagia, oropharyngeal phase: Secondary | ICD-10-CM | POA: Diagnosis not present

## 2019-10-20 DIAGNOSIS — R338 Other retention of urine: Secondary | ICD-10-CM | POA: Diagnosis not present

## 2019-10-20 NOTE — Telephone Encounter (Signed)
Oncology Nurse Navigator Documentation  Spoke with Transportation Coordinator Drucie Ip, requested Mr. Hardacre be contacted to coordinate rides for RT appts beginning with 1/27 appt.  She voiced understanding.  Gayleen Orem, RN, BSN Head & Neck Oncology Nurse [place] at [place] [phone]

## 2019-10-22 ENCOUNTER — Encounter: Payer: Self-pay | Admitting: *Deleted

## 2019-10-22 ENCOUNTER — Ambulatory Visit
Admission: RE | Admit: 2019-10-22 | Discharge: 2019-10-22 | Disposition: A | Discharge status: Home / Self Care | Payer: Medicare HMO | Source: Ambulatory Visit | Attending: Radiation Oncology | Admitting: Radiation Oncology

## 2019-10-22 ENCOUNTER — Other Ambulatory Visit: Payer: Self-pay

## 2019-10-22 DIAGNOSIS — Z510 Encounter for antineoplastic radiation therapy: Secondary | ICD-10-CM | POA: Diagnosis not present

## 2019-10-22 DIAGNOSIS — C320 Malignant neoplasm of glottis: Secondary | ICD-10-CM

## 2019-10-22 MED ORDER — SONAFINE EX EMUL
1.0000 "application " | Freq: Once | CUTANEOUS | Status: AC
  Administered 2019-10-22: 1 via TOPICAL

## 2019-10-22 NOTE — Progress Notes (Signed)
Oncology Nurse Navigator Documentation  Met with [name] following New Start RT.  He tolerated tmt well, denied questions/concerns. Escorted him to [facility] for post-SIM ed with RN Anderson [name]. Provided  Buyer, retail for signature, scanned/e-mailed to Transportation. He was escorted by RN Anderson [name] to lobby for ride home.  Gayleen Orem, RN, BSN Head & Neck Oncology Nurse [place] at [place] [phone]

## 2019-10-22 NOTE — Progress Notes (Signed)

## 2019-10-23 ENCOUNTER — Other Ambulatory Visit: Payer: Self-pay

## 2019-10-23 ENCOUNTER — Ambulatory Visit
Admission: RE | Admit: 2019-10-23 | Discharge: 2019-10-23 | Disposition: A | Discharge status: Home / Self Care | Payer: Medicare HMO | Source: Ambulatory Visit | Attending: Radiation Oncology | Admitting: Radiation Oncology

## 2019-10-23 DIAGNOSIS — C320 Malignant neoplasm of glottis: Secondary | ICD-10-CM | POA: Diagnosis not present

## 2019-10-23 DIAGNOSIS — Z510 Encounter for antineoplastic radiation therapy: Secondary | ICD-10-CM | POA: Diagnosis not present

## 2019-10-24 ENCOUNTER — Ambulatory Visit
Admission: RE | Admit: 2019-10-24 | Discharge: 2019-10-24 | Disposition: A | Discharge status: Home / Self Care | Payer: Medicare HMO | Source: Ambulatory Visit | Attending: Radiation Oncology | Admitting: Radiation Oncology

## 2019-10-24 ENCOUNTER — Ambulatory Visit (INDEPENDENT_AMBULATORY_CARE_PROVIDER_SITE_OTHER): Payer: Medicare HMO

## 2019-10-24 ENCOUNTER — Other Ambulatory Visit: Payer: Self-pay

## 2019-10-24 ENCOUNTER — Telehealth: Payer: Self-pay

## 2019-10-24 DIAGNOSIS — Z510 Encounter for antineoplastic radiation therapy: Secondary | ICD-10-CM | POA: Diagnosis not present

## 2019-10-24 DIAGNOSIS — Z95810 Presence of automatic (implantable) cardiac defibrillator: Secondary | ICD-10-CM

## 2019-10-24 DIAGNOSIS — I5022 Chronic systolic (congestive) heart failure: Secondary | ICD-10-CM

## 2019-10-24 DIAGNOSIS — C320 Malignant neoplasm of glottis: Secondary | ICD-10-CM | POA: Diagnosis not present

## 2019-10-24 NOTE — Progress Notes (Signed)
EPIC Encounter for ICM Monitoring  Patient Name: [name] is a 86 y.o. male Date: 10/24/2019 Primary Care Physican: Axel Filler, MD Primary Cardiologist:Taylor Electrophysiologist: Santina Evans Pacing: 99.7% 10/13/2019 Weight:140lbs  Attempted call to patient and unable to reach.   Transmission reviewed.   OptivolThoracic impedancetrending back toward baseline normal but still suggest possible fluid accumulation.  Prescribed:Furosemide 20 mg 1 tablet twice a day.   Labs: 08/15/2019 Creatinine 1.92, BUN 37, Potassium 4.5, Sodium 140, GFR 31-36  07/31/2019 Creatinine 1.54, BUN 23, Potassium 4.6, Sodium 134, GFR 40-47  07/30/2019 Creatinine 1.79, BUN 28, Potassium 4.8, Sodium 133, GFR 34-39  07/29/2019 Creatinine 2.22, BUN 39, Potassium 4.1, Sodium 140, GFR 26-30  A complete set of results can be found in Results Review.  Recommendations:  Unable to reach.    Follow-up plan: ICM clinic phone appointment on 11/17/2019.   91 day device clinic remote transmission 12/03/2019.   Copy of ICM check sent to Dr. Lovena Le.   3 month ICM trend: 10/24/2019    1 Year ICM trend:       [name], RN 10/24/2019 1:18 PM

## 2019-10-24 NOTE — Telephone Encounter (Signed)
Remote ICM transmission received.  Attempted call to patient regarding ICM remote transmission and no answer.  

## 2019-10-27 ENCOUNTER — Ambulatory Visit
Admission: RE | Admit: 2019-10-27 | Discharge: 2019-10-27 | Disposition: A | Discharge status: Home / Self Care | Payer: Medicare HMO | Source: Ambulatory Visit | Attending: Radiation Oncology | Admitting: Radiation Oncology

## 2019-10-27 ENCOUNTER — Other Ambulatory Visit: Payer: Self-pay

## 2019-10-27 ENCOUNTER — Other Ambulatory Visit: Payer: Self-pay | Admitting: Radiation Oncology

## 2019-10-27 DIAGNOSIS — Z510 Encounter for antineoplastic radiation therapy: Secondary | ICD-10-CM | POA: Insufficient documentation

## 2019-10-27 DIAGNOSIS — C320 Malignant neoplasm of glottis: Secondary | ICD-10-CM | POA: Insufficient documentation

## 2019-10-27 MED ORDER — LIDOCAINE VISCOUS HCL 2 % MT SOLN: OROMUCOSAL | 3 refills | Status: DC

## 2019-10-28 ENCOUNTER — Other Ambulatory Visit: Payer: Self-pay

## 2019-10-28 ENCOUNTER — Ambulatory Visit
Admission: RE | Admit: 2019-10-28 | Discharge: 2019-10-28 | Disposition: A | Discharge status: Home / Self Care | Payer: Medicare HMO | Source: Ambulatory Visit | Attending: Radiation Oncology | Admitting: Radiation Oncology

## 2019-10-28 DIAGNOSIS — R338 Other retention of urine: Secondary | ICD-10-CM | POA: Diagnosis not present

## 2019-10-28 DIAGNOSIS — N183 Chronic kidney disease, stage 3 unspecified: Secondary | ICD-10-CM | POA: Diagnosis not present

## 2019-10-28 DIAGNOSIS — I130 Hypertensive heart and chronic kidney disease with heart failure and stage 1 through stage 4 chronic kidney disease, or unspecified chronic kidney disease: Secondary | ICD-10-CM | POA: Diagnosis not present

## 2019-10-28 DIAGNOSIS — N401 Enlarged prostate with lower urinary tract symptoms: Secondary | ICD-10-CM | POA: Diagnosis not present

## 2019-10-28 DIAGNOSIS — R1312 Dysphagia, oropharyngeal phase: Secondary | ICD-10-CM | POA: Diagnosis not present

## 2019-10-28 DIAGNOSIS — C320 Malignant neoplasm of glottis: Secondary | ICD-10-CM | POA: Diagnosis not present

## 2019-10-28 DIAGNOSIS — I5022 Chronic systolic (congestive) heart failure: Secondary | ICD-10-CM | POA: Diagnosis not present

## 2019-10-28 DIAGNOSIS — Z510 Encounter for antineoplastic radiation therapy: Secondary | ICD-10-CM | POA: Diagnosis not present

## 2019-10-28 DIAGNOSIS — E1122 Type 2 diabetes mellitus with diabetic chronic kidney disease: Secondary | ICD-10-CM | POA: Diagnosis not present

## 2019-10-28 DIAGNOSIS — K5900 Constipation, unspecified: Secondary | ICD-10-CM | POA: Diagnosis not present

## 2019-10-29 ENCOUNTER — Ambulatory Visit
Admission: RE | Admit: 2019-10-29 | Discharge: 2019-10-29 | Disposition: A | Discharge status: Home / Self Care | Payer: Medicare HMO | Source: Ambulatory Visit | Attending: Radiation Oncology | Admitting: Radiation Oncology

## 2019-10-29 ENCOUNTER — Other Ambulatory Visit: Payer: Self-pay

## 2019-10-29 DIAGNOSIS — R1312 Dysphagia, oropharyngeal phase: Secondary | ICD-10-CM | POA: Diagnosis not present

## 2019-10-29 DIAGNOSIS — Z510 Encounter for antineoplastic radiation therapy: Secondary | ICD-10-CM | POA: Diagnosis not present

## 2019-10-29 DIAGNOSIS — C320 Malignant neoplasm of glottis: Secondary | ICD-10-CM | POA: Diagnosis not present

## 2019-10-30 ENCOUNTER — Encounter: Payer: Self-pay | Admitting: *Deleted

## 2019-10-30 ENCOUNTER — Other Ambulatory Visit: Payer: Self-pay

## 2019-10-30 ENCOUNTER — Ambulatory Visit: Payer: Medicare HMO | Attending: Radiation Oncology

## 2019-10-30 ENCOUNTER — Ambulatory Visit
Admission: RE | Admit: 2019-10-30 | Discharge: 2019-10-30 | Disposition: A | Discharge status: Home / Self Care | Payer: Medicare HMO | Source: Ambulatory Visit | Attending: Radiation Oncology | Admitting: Radiation Oncology

## 2019-10-30 DIAGNOSIS — C320 Malignant neoplasm of glottis: Secondary | ICD-10-CM | POA: Diagnosis not present

## 2019-10-30 DIAGNOSIS — R1312 Dysphagia, oropharyngeal phase: Secondary | ICD-10-CM | POA: Diagnosis not present

## 2019-10-30 DIAGNOSIS — Z510 Encounter for antineoplastic radiation therapy: Secondary | ICD-10-CM | POA: Diagnosis not present

## 2019-10-30 NOTE — Therapy (Signed)
[facility] [address] [place], [place], [address] Phone: [phone]   Fax:  [phone]  Speech Language Pathology Evaluation  Patient Details  Name: [name] MRN: [id number] Date of Birth: 11-22-1930 Referring Provider (SLP): Eppie Gibson, MD   Encounter Date: 10/30/2019  End of Session - 10/30/19 1316    Visit Number  1    Number of Visits  7    Date for SLP Re-Evaluation  01/28/20    SLP Start Time  68    SLP Stop Time   1055    SLP Time Calculation (min)  33 min    Activity Tolerance  Patient tolerated treatment well       Past Medical History:  Diagnosis Date  . Adenomatous colon polyp 02/14/2012  . AICD (automatic cardioverter/defibrillator) present    Medtronic   . BBB (bundle branch block)    right  . Carotid stenosis    a. s/p L carotid stent 2004;  b. Carotid US (09/2014): Bilateral ICA 1-39%, left ECA >59%, normal subclavian bilaterally, occluded left vertebral >> FU 2 years  . Chronic kidney disease   . Chronic systolic CHF (congestive heart failure) (HCC)    a. ischemic CM EF 15-20%;  b. s/p AICD 05/24/04;  c. Echo 7/06: EF 30-40%, mild reduced RVSF, d. Echo 12/2015 EF 35-40%  . Elevated PSA   . History of kidney stones    x 1  . HTN (hypertension)   . Hyperlipidemia   . ICD (implantable cardiac defibrillator) in place 12-25-2012   MDT CRTD upgrade by Dr Lovena Le  . Myocardial infarction ([place]) 1990  . Pericardial effusion    Echocardiogram (09/2014): EF 25% with distal anterior, distal inferior, distal lateral and apical akinesis, grade 1 diastolic dysfunction, very mild aortic stenosis (mean 7 mmHg) - this may be depressed due to low EF (2-D images suggest mild to moderate aortic stenosis), large pericardial effusion, no RA collapse  . Pneumonia   . Presence of permanent cardiac pacemaker    Medtronic  . PVD (peripheral vascular disease) ([place])    s/p L carotid PTCA/stent 2004  .  Transient ischemic attack   . Type II diabetes mellitus ([place])    type II    Past Surgical History:  Procedure Laterality Date  . BI-VENTRICULAR IMPLANTABLE CARDIOVERTER DEFIBRILLATOR UPGRADE N/A 12/25/2012   Procedure: BI-VENTRICULAR IMPLANTABLE CARDIOVERTER DEFIBRILLATOR UPGRADE;  Surgeon: Evans Lance, MD;  Location: [facility] CATH LAB;  Service: Cardiovascular;  Laterality: N/A;  . BIV ICD UPGRADE  12/25/2012   MDT CRTD upgrade by Dr Lovena Le for ischemic cardiomyopathy and worsening conduction system disease  . CARDIAC CATHETERIZATION  06/2003,  01/2004  . CARDIAC CATHETERIZATION N/A 01/13/2016   Procedure: Left Heart Cath and Coronary Angiography;  Surgeon: Jettie Booze, MD;  Location: [facility] CV LAB;  Service: Cardiovascular;  Laterality: N/A;  . CARDIAC DEFIBRILLATOR PLACEMENT  05/24/2004   Implantation of a MDT single-chamber defibrillator  . CAROTID STENT  09/11/2003   Percutaneous transluminal angioplasty and stent placement of the left internal carotid artery.  Marland Kitchen CATARACT EXTRACTION W/ INTRAOCULAR LENS  IMPLANT, BILATERAL Bilateral ~ 2010  . CORONARY ANGIOPLASTY WITH STENT PLACEMENT  1990   "2" (12/25/2012)  . CYSTOSCOPY    . DIRECT LARYNGOSCOPY N/A 08/15/2019   Procedure: MICRODIRECT LARYNGOSCOPY WITH BIOPSY;  Surgeon: Melida Quitter, MD;  Location: [facility];  Service: ENT;  Laterality: N/A;  . INSERT / REPLACE / REMOVE PACEMAKER    .  LEAD REVISION N/A 12/25/2012   Procedure: LEAD REVISION;  Surgeon: Evans Lance, MD;  Location: [facility] CATH LAB;  Service: Cardiovascular;  Laterality: N/A;  . RIGID ESOPHAGOSCOPY N/A 08/15/2019   Procedure: RIGID ESOPHAGOSCOPY;  Surgeon: Melida Quitter, MD;  Location: [facility] OR;  Service: ENT;  Laterality: N/A;    There were no vitals filed for this visit.  Subjective Assessment - 10/30/19 1301    Subjective  Pt had MBSS August 2020 with hospitalization, with probable PNA, for a hypoglycemic event. Study showed significnt dysphagia incuding oral deficits as  piecemeal swallowing, passive instead of active transit of bolus, and inconsistent timing of oral to pharygeal stage. Pharyngeal stage deficits included decr'd tongue base retraction and pharyngeal peristalsis (and thus, likely as a result), severe pharyngeal residue.Pt demonstrated sensed aspiration on thin. Dyshpgia I (puree) diet with nectar liquids were recommended, with chin tuck with POs. Pt reports he is ahdering to this deit now as sister and son are preparing food. Pt asked me if he is allowed fish from [facility]. SLP told him if it is pureed.    Currently in Pain?  No/denies         SLP Evaluation [facility] - 10/30/19 1301      SLP Visit Information   SLP Received On  10/31/19    Referring Provider (SLP)  Eppie Gibson, MD    Onset Date  October 2020    Medical Diagnosis  rt vocal fold ISCC      Subjective   Patient/Family Stated Goal  "I wanna swallow right"      General Information   HPI  Oct oer 2020 laryngoscopy irrecgular r vocal fold  and rt hemilarynx with decr'd movement. 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       Prior Functional Status   Cognitive/Linguistic Baseline  Within functional limits      Cognition   Overall Cognitive Status  Within Functional Limits for tasks assessed      Oral Motor/Sensory Function   Labial Symmetry  Within Functional Limits    Lingual Symmetry  Within Functional Limits    Lingual Coordination  Reduced    Velum  --   deferred due to COVID-19 risk measures   Overall Oral Motor/Sensory Function  Pt endentulous today however he reasussres SLP that he wears his teeth at home.      Motor Speech   Overall Motor Speech  Appears within functional limits for tasks assessed        Pt currently tolerates dysphagia I diet with nectar liquids, according to pt. No overt s/sx aspiration PNA observed or reported today by pt. Pt with delay of 4 seconds when asked to swallow his saliva. Pt with slightly weaker than WNL cough.  Throat clear was WNL.  POs: Pt ate spoonfulls of applesauce without overt s/s aspiration. SLP did encourage pt to swallow x2 total with each bite however, provided that his MBSS stated severe pharyngeal residue. Pt compliant with this request with subsequent swallows of applesauce.  Thyroid elevation appeared adequate, and initial swallows appeared WFL re: timliness (within 2 seconds of bolus presentation).  Pt's swallow deemed WFL/WNL for dys I food at this time.   Because data states the risk for dysphagia during and after radiation treatment is high due to undergoing radiation tx, SLP taught pt about the possibility of reduced/limited ability for PO intake during rad tx. SLP encouraged pt to continue swallowing POs as far into rad tx  as possible, even ingesting POs and/or completing HEP shortly after administration of pain meds. Among other modifications for days when pt cannot functionally swallow, SLP talked about performing only non-swallowing tasks on the handout/HEP, and then adding swallowing tasks back in when it becomes possible to do so.  SLP educated pt re: changes to swallowing musculature after rad tx, and why adherence to dysphagia HEP provided today and PO consumption was necessary to inhibit or reduce muscle fibrosis following rad tx. Pt demonstrated understanding of these things to SLP.    SLP then developed a HEP for pt and pt was instructed how to perform exercises involving lingual, vocal, and pharyngeal strengthening. SLP performed each exercise and pt return demonstrated each exercise. SLP ensured pt performance was correct prior to moving on to next exercise. Pt was instructed to complete this program 2 times a day, until 6 months after his last rad tx, then x2 a week after that.                SLP Education - 10/30/19 1315    Education Details  HEP procedure, late effect head/neck radiation on swllowing    Person(s) Educated  Patient    Methods   Explanation;Demonstration;Verbal cues;Handout    Comprehension  Verbalized understanding;Returned demonstration;Verbal cues required;Need further instruction       SLP Short Term Goals - 10/30/19 1325      SLP SHORT TERM GOAL #1   Title  pt will demo HEP with occasional min A over two sessions    Time  3    Period  --   sessions, for all STGs   Status  New      SLP SHORT TERM GOAL #2   Title  pt will tell SLP rationale for HEP with rare min A over two sessions    Time  3    Status  New      SLP SHORT TERM GOAL #3   Title  pt will tell SLP 3 overt s/sx of aspiration PNA with modified independence    Time  3    Status  New       SLP Long Term Goals - 10/30/19 1327      SLP LONG TERM GOAL #1   Title  pt will demo HEP with rare min A over two sessions    Time  6    Period  --   sessions, for all LTGS   Status  New      SLP LONG TERM GOAL #2   Title  pt will tell SLP when he can modify HEP frequency, with rare min A    Time  6    Status  New       Plan - 10/30/19 1317    Clinical Impression Statement  Pt presents today with WNL/WFL swallowing ability with his recomended FOOD (SLP could not assess pt wth nectar due to inability to obtain nectar liquids today).  Pt was without overt s/sx aspiration with pureed items, and he tucked his chin spontaneously. SLP ?s pt strict adherence to dys I diet as he stated '(his) sister and son say, "you cna't eat that, and 'you can't eat that."' No overt s/sx aspiration PNA reported or observed today. Data suggests that as pts progress through rad or chemorad therapy that their swallowing ability will decrease. THEREFORE, MEDICAL STAFF TRETING PT THROUGHOUT HIS RADIATION THERPY SHOULD CLOSELY MONITOR PT FOR OVERT S/SX ASPIRATION PNA AS PT IS AT HIGHER RISK FOR  THIS DUE TO CURRENT ORAL AND PHARYNGEAL STAGE DYSPHGIA. After his radiation therapy is completed, pt's ability with swallowing is threatened by muscle fibrosis that will likely develop  after rad/chemorad is completed. Therefore, pt was provided an indivdualized exercise program to mitigate/eliminate muscle fibrosis following rad tx. Skilled ST would be beneficial to the pt in order to regularly assess pt's safety with POs and/or need for instrumental swallow assessment, as well as to assess proper completion of HEP.    Speech Therapy Frequency  --   approx once every 4 weeks   Duration  --   6 sessions   Treatment/Interventions  Aspiration precaution training;Pharyngeal strengthening exercises;Diet toleration management by SLP;Internal/external aids;Patient/family education;Compensatory strategies;SLP instruction and feedback;Trials of upgraded texture/liquids    Potential to Achieve Goals  Fair    Potential Considerations  Severity of impairments;Previous level of function    SLP Home Exercise Plan  provided today    Consulted and Agree with Plan of Care  Patient       Patient will benefit from skilled therapeutic intervention in order to improve the following deficits and impairments:   Dysphagia, oropharyngeal phase    Problem List Patient Active Problem List   Diagnosis Date Noted  . Enlarged prostate   . 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 Pharyngeal dysphagia 07/10/2019  . Dysphonia 07/10/2019  . Hypertensive retinopathy of both eyes 02/05/2018  . Mild nonproliferative diabetic retinopathy of both eyes without macular edema associated with type 2 diabetes mellitus ([place]) 02/05/2018  . Dry eye syndrome of both eyes 06/06/2017  . Weight loss 12/11/2016  . Advanced care planning/counseling discussion 12/11/2016  . Urinary retention 01/13/2016  . Healthcare maintenance 11/22/2015  . [name] (chronic kidney disease) stage 3, GFR 30-59 ml/min (HCC) 12/04/2014  . Automatic implantable cardioverter-defibrillator in situ 01/16/2012  . Constipation 03/10/2009  . Coronary atherosclerosis 12/25/2007  . Chronic systolic congestive heart failure ([place])  12/25/2007  . Hyperlipidemia 01/02/2007  . Type 2 diabetes mellitus with peripheral vascular disease ([place]) 01/01/1989    [facility] ,[place], [place]  10/30/2019, 1:28 PM  [facility] [address] [place], [place], [address] Phone: [phone]   Fax:  [phone]  Name: [name] MRN: [id number] Date of Birth: 1931-06-27

## 2019-10-30 NOTE — Patient Instructions (Signed)
SWALLOWING EXERCISES Do these  until 6 week after your last dy of radiation , then 2 times per week afterwards  1. Effortful Swallows - Press your tongue against the roof of your mouth for 3 seconds, then squeeze          the muscles in your neck while you swallow your saliva or a sip of water - Repeat 20 times, 2-3 times a day, and use whenever you eat or drink  2. Pitch Raise - Repeat "he", once per second in as high of a pitch as you can - Repeat 20 times, 2-3 times a day  3. Breath Hold - Say "HUH!" loudly, then hold your breath for 3 seconds at your voice box - Repeat 20 times, 2-3 times a day   4.  "Siren" exercise  -say "eeee" at a low tone  - glide upward to  High tone and then back down to a low tone  - do this 20 times, twice a day

## 2019-10-31 ENCOUNTER — Other Ambulatory Visit: Payer: Self-pay

## 2019-10-31 ENCOUNTER — Ambulatory Visit
Admission: RE | Admit: 2019-10-31 | Discharge: 2019-10-31 | Disposition: A | Discharge status: Home / Self Care | Payer: Medicare HMO | Source: Ambulatory Visit | Attending: Radiation Oncology | Admitting: Radiation Oncology

## 2019-10-31 DIAGNOSIS — C320 Malignant neoplasm of glottis: Secondary | ICD-10-CM | POA: Diagnosis not present

## 2019-10-31 DIAGNOSIS — Z510 Encounter for antineoplastic radiation therapy: Secondary | ICD-10-CM | POA: Diagnosis not present

## 2019-10-31 NOTE — Progress Notes (Signed)
Oncology Nurse Navigator Documentation  Met [name] s/p RT, escorted him to [facility] where he was seen by SLP [name] in [facility].   I explained purpose of HEP instructed by [name], emphasized importance of compliance to optimize swallowing function during and after RT.  He voiced understanding. He denied repeat problem with transportation. I encouraged him to call me with needs/concerns.  Gayleen Orem, RN, BSN Head & Neck Oncology Nurse [place] at [place] [phone]

## 2019-11-03 ENCOUNTER — Other Ambulatory Visit: Payer: Self-pay

## 2019-11-03 ENCOUNTER — Ambulatory Visit
Admission: RE | Admit: 2019-11-03 | Discharge: 2019-11-03 | Disposition: A | Discharge status: Home / Self Care | Payer: Medicare HMO | Source: Ambulatory Visit | Attending: Radiation Oncology | Admitting: Radiation Oncology

## 2019-11-03 DIAGNOSIS — Z510 Encounter for antineoplastic radiation therapy: Secondary | ICD-10-CM | POA: Diagnosis not present

## 2019-11-03 DIAGNOSIS — C320 Malignant neoplasm of glottis: Secondary | ICD-10-CM | POA: Diagnosis not present

## 2019-11-04 ENCOUNTER — Ambulatory Visit
Admission: RE | Admit: 2019-11-04 | Discharge: 2019-11-04 | Disposition: A | Discharge status: Home / Self Care | Payer: Medicare HMO | Source: Ambulatory Visit | Attending: Radiation Oncology | Admitting: Radiation Oncology

## 2019-11-04 ENCOUNTER — Other Ambulatory Visit: Payer: Self-pay

## 2019-11-04 DIAGNOSIS — Z510 Encounter for antineoplastic radiation therapy: Secondary | ICD-10-CM | POA: Diagnosis not present

## 2019-11-04 DIAGNOSIS — C320 Malignant neoplasm of glottis: Secondary | ICD-10-CM | POA: Diagnosis not present

## 2019-11-05 ENCOUNTER — Other Ambulatory Visit: Payer: Self-pay

## 2019-11-05 ENCOUNTER — Ambulatory Visit
Admission: RE | Admit: 2019-11-05 | Discharge: 2019-11-05 | Disposition: A | Discharge status: Home / Self Care | Payer: Medicare HMO | Source: Ambulatory Visit | Attending: Radiation Oncology | Admitting: Radiation Oncology

## 2019-11-05 ENCOUNTER — Inpatient Hospital Stay: Payer: Medicare HMO | Attending: Radiation Oncology | Admitting: Nutrition

## 2019-11-05 DIAGNOSIS — Z510 Encounter for antineoplastic radiation therapy: Secondary | ICD-10-CM | POA: Diagnosis not present

## 2019-11-05 DIAGNOSIS — E1122 Type 2 diabetes mellitus with diabetic chronic kidney disease: Secondary | ICD-10-CM | POA: Diagnosis not present

## 2019-11-05 DIAGNOSIS — N183 Chronic kidney disease, stage 3 unspecified: Secondary | ICD-10-CM | POA: Diagnosis not present

## 2019-11-05 DIAGNOSIS — I130 Hypertensive heart and chronic kidney disease with heart failure and stage 1 through stage 4 chronic kidney disease, or unspecified chronic kidney disease: Secondary | ICD-10-CM | POA: Diagnosis not present

## 2019-11-05 DIAGNOSIS — I5022 Chronic systolic (congestive) heart failure: Secondary | ICD-10-CM | POA: Diagnosis not present

## 2019-11-05 DIAGNOSIS — C320 Malignant neoplasm of glottis: Secondary | ICD-10-CM | POA: Diagnosis not present

## 2019-11-05 DIAGNOSIS — R338 Other retention of urine: Secondary | ICD-10-CM | POA: Diagnosis not present

## 2019-11-05 DIAGNOSIS — R1312 Dysphagia, oropharyngeal phase: Secondary | ICD-10-CM | POA: Diagnosis not present

## 2019-11-05 DIAGNOSIS — N401 Enlarged prostate with lower urinary tract symptoms: Secondary | ICD-10-CM | POA: Diagnosis not present

## 2019-11-05 DIAGNOSIS — K5900 Constipation, unspecified: Secondary | ICD-10-CM | POA: Diagnosis not present

## 2019-11-05 NOTE — Progress Notes (Signed)
86 year old male diagnosed with glottis cancer.  He is a patient of Dr. Isidore Moos receiving 20 fractions of radiation therapy.  His final radiation treatment is scheduled for February 23.  Past medical history includes diabetes type 2, TIA, MI, hyperlipidemia, hypertension, CHF.  Medications include Maalox, Lasix, Lantus, Protonix, MiraLAX, and Senokot.  Labs include HG A1c 7.0.  Glucose 137, BUN 37, and creatinine 1.92.  Height: 5 feet 8 inches. Weight: 141.6 pounds on February 10. Usual body weight: 180 pounds per patient. BMI: 21.53.  Patient reports it is difficult for him to talk secondary to hoarseness. Reports he is tolerating his pured diet with nectar thick liquids. Patient does a lot of his own cooking. Verbalizes that he is using a thickening agent for his liquid. Patient denies nutrition impact symptoms.  Nutrition diagnosis: Unintended weight loss related to diagnosis of glottis cancer and associated treatments as evidenced by 21% weight loss from usual body weight.  Intervention: Patient was educated to continue dysphagia 1 nectar thick liquid diet. Enforced importance of blenderized foods and thickening liquids. Recommended patient add oral nutrition supplements and provided 1 complementary case of Ensure Enlive. Encouraged increased protein and calories to continue weight recovery. Questions were answered.  Teach back method used.  Contact information provided.  Monitoring, evaluation, goals: Patient will tolerate adequate calories and protein to minimize weight loss throughout treatment.  Next visit: Patient to contact me for any questions or concerns.  **Disclaimer: This note was dictated with voice recognition software. Similar sounding words can inadvertently be transcribed and this note may contain transcription errors which may not have been corrected upon publication of note.**

## 2019-11-06 ENCOUNTER — Other Ambulatory Visit: Payer: Self-pay

## 2019-11-06 ENCOUNTER — Ambulatory Visit
Admission: RE | Admit: 2019-11-06 | Discharge: 2019-11-06 | Disposition: A | Discharge status: Home / Self Care | Payer: Medicare HMO | Source: Ambulatory Visit | Attending: Radiation Oncology | Admitting: Radiation Oncology

## 2019-11-06 DIAGNOSIS — Z510 Encounter for antineoplastic radiation therapy: Secondary | ICD-10-CM | POA: Diagnosis not present

## 2019-11-06 DIAGNOSIS — C320 Malignant neoplasm of glottis: Secondary | ICD-10-CM | POA: Diagnosis not present

## 2019-11-07 ENCOUNTER — Ambulatory Visit
Admission: RE | Admit: 2019-11-07 | Discharge: 2019-11-07 | Disposition: A | Discharge status: Home / Self Care | Payer: Medicare HMO | Source: Ambulatory Visit | Attending: Radiation Oncology | Admitting: Radiation Oncology

## 2019-11-07 ENCOUNTER — Other Ambulatory Visit: Payer: Self-pay

## 2019-11-07 DIAGNOSIS — Z510 Encounter for antineoplastic radiation therapy: Secondary | ICD-10-CM | POA: Diagnosis not present

## 2019-11-07 DIAGNOSIS — C320 Malignant neoplasm of glottis: Secondary | ICD-10-CM | POA: Diagnosis not present

## 2019-11-10 ENCOUNTER — Other Ambulatory Visit: Payer: Self-pay | Admitting: Radiation Oncology

## 2019-11-10 ENCOUNTER — Other Ambulatory Visit: Payer: Self-pay

## 2019-11-10 ENCOUNTER — Ambulatory Visit
Admission: RE | Admit: 2019-11-10 | Discharge: 2019-11-10 | Disposition: A | Discharge status: Home / Self Care | Payer: Medicare HMO | Source: Ambulatory Visit | Attending: Radiation Oncology | Admitting: Radiation Oncology

## 2019-11-10 DIAGNOSIS — C320 Malignant neoplasm of glottis: Secondary | ICD-10-CM | POA: Diagnosis not present

## 2019-11-10 DIAGNOSIS — Z510 Encounter for antineoplastic radiation therapy: Secondary | ICD-10-CM | POA: Diagnosis not present

## 2019-11-10 LAB — BASIC METABOLIC PANEL - CANCER CENTER ONLY
Anion gap: 9 (ref 5–15)
BUN: 33 mg/dL — ABNORMAL HIGH (ref 8–23)
CO2: 33 mmol/L — ABNORMAL HIGH (ref 22–32)
Calcium: 10.1 mg/dL (ref 8.9–10.3)
Chloride: 103 mmol/L (ref 98–111)
Creatinine: 2.09 mg/dL — ABNORMAL HIGH (ref 0.61–1.24)
GFR, Est AFR Am: 32 mL/min — ABNORMAL LOW (ref >60)
GFR, Estimated: 28 mL/min — ABNORMAL LOW (ref >60)
Glucose, Bld: 193 mg/dL — ABNORMAL HIGH (ref 70–99)
Potassium: 3.9 mmol/L (ref 3.5–5.1)
Sodium: 145 mmol/L (ref 135–145)

## 2019-11-11 ENCOUNTER — Other Ambulatory Visit: Payer: Self-pay

## 2019-11-11 ENCOUNTER — Ambulatory Visit
Admission: RE | Admit: 2019-11-11 | Discharge: 2019-11-11 | Disposition: A | Discharge status: Home / Self Care | Payer: Medicare HMO | Source: Ambulatory Visit | Attending: Radiation Oncology | Admitting: Radiation Oncology

## 2019-11-11 ENCOUNTER — Telehealth: Payer: Self-pay | Admitting: *Deleted

## 2019-11-11 DIAGNOSIS — Z510 Encounter for antineoplastic radiation therapy: Secondary | ICD-10-CM | POA: Diagnosis not present

## 2019-11-11 DIAGNOSIS — C320 Malignant neoplasm of glottis: Secondary | ICD-10-CM | POA: Diagnosis not present

## 2019-11-11 NOTE — Telephone Encounter (Signed)
CALLED PATIENT'S SON- Jelani Angus, JR. TO INFORM OF NUTRITION APPT. (TELEPHONE APPT.) ON 11-14-19 @ 12 PM, SPOKE WITH PATIENT'S SON AND HE UNDERSTOOD THIS APPT. AND IS GOOD WITH IT.

## 2019-11-12 ENCOUNTER — Ambulatory Visit
Admission: RE | Admit: 2019-11-12 | Discharge: 2019-11-12 | Disposition: A | Discharge status: Home / Self Care | Payer: Medicare HMO | Source: Ambulatory Visit | Attending: Radiation Oncology | Admitting: Radiation Oncology

## 2019-11-12 ENCOUNTER — Other Ambulatory Visit: Payer: Self-pay

## 2019-11-12 DIAGNOSIS — I130 Hypertensive heart and chronic kidney disease with heart failure and stage 1 through stage 4 chronic kidney disease, or unspecified chronic kidney disease: Secondary | ICD-10-CM | POA: Diagnosis not present

## 2019-11-12 DIAGNOSIS — R338 Other retention of urine: Secondary | ICD-10-CM | POA: Diagnosis not present

## 2019-11-12 DIAGNOSIS — R1312 Dysphagia, oropharyngeal phase: Secondary | ICD-10-CM | POA: Diagnosis not present

## 2019-11-12 DIAGNOSIS — C320 Malignant neoplasm of glottis: Secondary | ICD-10-CM | POA: Diagnosis not present

## 2019-11-12 DIAGNOSIS — K5900 Constipation, unspecified: Secondary | ICD-10-CM | POA: Diagnosis not present

## 2019-11-12 DIAGNOSIS — E1122 Type 2 diabetes mellitus with diabetic chronic kidney disease: Secondary | ICD-10-CM | POA: Diagnosis not present

## 2019-11-12 DIAGNOSIS — I5022 Chronic systolic (congestive) heart failure: Secondary | ICD-10-CM | POA: Diagnosis not present

## 2019-11-12 DIAGNOSIS — Z510 Encounter for antineoplastic radiation therapy: Secondary | ICD-10-CM | POA: Diagnosis not present

## 2019-11-12 DIAGNOSIS — N183 Chronic kidney disease, stage 3 unspecified: Secondary | ICD-10-CM | POA: Diagnosis not present

## 2019-11-12 DIAGNOSIS — N401 Enlarged prostate with lower urinary tract symptoms: Secondary | ICD-10-CM | POA: Diagnosis not present

## 2019-11-13 ENCOUNTER — Ambulatory Visit: Payer: Medicare HMO

## 2019-11-14 ENCOUNTER — Telehealth: Payer: Self-pay | Admitting: *Deleted

## 2019-11-14 ENCOUNTER — Other Ambulatory Visit: Payer: Self-pay

## 2019-11-14 ENCOUNTER — Inpatient Hospital Stay: Payer: Medicare HMO | Admitting: Nutrition

## 2019-11-14 ENCOUNTER — Ambulatory Visit
Admission: RE | Admit: 2019-11-14 | Discharge: 2019-11-14 | Disposition: A | Discharge status: Home / Self Care | Payer: Medicare HMO | Source: Ambulatory Visit | Attending: Radiation Oncology | Admitting: Radiation Oncology

## 2019-11-14 DIAGNOSIS — C320 Malignant neoplasm of glottis: Secondary | ICD-10-CM | POA: Diagnosis not present

## 2019-11-14 DIAGNOSIS — Z510 Encounter for antineoplastic radiation therapy: Secondary | ICD-10-CM | POA: Diagnosis not present

## 2019-11-14 NOTE — Progress Notes (Signed)
Nutrition follow up completed with patient and son over the phone. Son cares for patient and cooks and purees food for him. Reports patient has been refusing to eat and states he has dietary restrictions. Son reports weight loss but is not sure how much. He would like more information on what patient can eat. Educated on the importance of providing pureed, nectar thick liquid diet. Brief review of nectar thick liquids and how to puree and thicken foods. Encouraged Ensure Enlive throughout the day, thickened as needed to nectar thick consistency. Encouraged swallowing exercises as provided by Speech Therapist. Questions answered. Will provide one case of Ensure Enlive for patient to pick up Monday. Contact information given.

## 2019-11-14 NOTE — Telephone Encounter (Signed)
Oncology Nurse Navigator Documentation  Facilitated Mr. Burnsworth' updating transportation waiver, scanned/emailed to Transportation.  Gayleen Orem, RN, BSN Head & Neck Oncology Nurse [place] at [place] [phone]

## 2019-11-16 LAB — CUP PACEART REMOTE DEVICE CHECK
Battery Remaining Longevity: 10 mo
Battery Voltage: 2.85 V
Brady Statistic AP VP Percent: 0.24 %
Brady Statistic AP VS Percent: 0.01 %
Brady Statistic AS VP Percent: 97.92 %
Brady Statistic AS VS Percent: 1.84 %
Brady Statistic RA Percent Paced: 0.25 %
Brady Statistic RV Percent Paced: 97.99 %
Date Time Interrogation Session: 20210220122648
HighPow Impedance: 46 Ohm
Implantable Lead Implant Date: 20140402
Implantable Lead Implant Date: 20140402
Implantable Lead Implant Date: 20140402
Implantable Lead Location: 753858
Implantable Lead Location: 753859
Implantable Lead Location: 753860
Implantable Lead Model: 4194
Implantable Lead Model: 5076
Implantable Lead Model: 6935
Implantable Pulse Generator Implant Date: 20140402
Lead Channel Impedance Value: 228 Ohm
Lead Channel Impedance Value: 342 Ohm
Lead Channel Impedance Value: 342 Ohm
Lead Channel Impedance Value: 361 Ohm
Lead Channel Impedance Value: 399 Ohm
Lead Channel Impedance Value: 456 Ohm
Lead Channel Pacing Threshold Amplitude: 0.875 V
Lead Channel Pacing Threshold Amplitude: 0.875 V
Lead Channel Pacing Threshold Amplitude: 1 V
Lead Channel Pacing Threshold Pulse Width: 0.4 ms
Lead Channel Pacing Threshold Pulse Width: 0.4 ms
Lead Channel Pacing Threshold Pulse Width: 0.6 ms
Lead Channel Sensing Intrinsic Amplitude: 2.125 mV
Lead Channel Sensing Intrinsic Amplitude: 2.125 mV
Lead Channel Sensing Intrinsic Amplitude: 9.625 mV
Lead Channel Sensing Intrinsic Amplitude: 9.625 mV
Lead Channel Setting Pacing Amplitude: 1.5 V
Lead Channel Setting Pacing Amplitude: 2 V
Lead Channel Setting Pacing Amplitude: 2.5 V
Lead Channel Setting Pacing Pulse Width: 0.4 ms
Lead Channel Setting Pacing Pulse Width: 0.6 ms
Lead Channel Setting Sensing Sensitivity: 0.3 mV

## 2019-11-17 ENCOUNTER — Ambulatory Visit
Admission: RE | Admit: 2019-11-17 | Discharge: 2019-11-17 | Disposition: A | Discharge status: Home / Self Care | Payer: Medicare HMO | Source: Ambulatory Visit | Attending: Radiation Oncology | Admitting: Radiation Oncology

## 2019-11-17 ENCOUNTER — Other Ambulatory Visit: Payer: Self-pay

## 2019-11-17 ENCOUNTER — Ambulatory Visit (INDEPENDENT_AMBULATORY_CARE_PROVIDER_SITE_OTHER): Payer: Medicare HMO

## 2019-11-17 DIAGNOSIS — R112 Nausea with vomiting, unspecified: Secondary | ICD-10-CM | POA: Diagnosis not present

## 2019-11-17 DIAGNOSIS — I5022 Chronic systolic (congestive) heart failure: Secondary | ICD-10-CM | POA: Diagnosis present

## 2019-11-17 DIAGNOSIS — R1310 Dysphagia, unspecified: Secondary | ICD-10-CM | POA: Diagnosis not present

## 2019-11-17 DIAGNOSIS — I454 Nonspecific intraventricular block: Secondary | ICD-10-CM | POA: Diagnosis present

## 2019-11-17 DIAGNOSIS — E43 Unspecified severe protein-calorie malnutrition: Secondary | ICD-10-CM | POA: Diagnosis present

## 2019-11-17 DIAGNOSIS — C320 Malignant neoplasm of glottis: Secondary | ICD-10-CM | POA: Diagnosis present

## 2019-11-17 DIAGNOSIS — E46 Unspecified protein-calorie malnutrition: Secondary | ICD-10-CM | POA: Diagnosis not present

## 2019-11-17 DIAGNOSIS — E1151 Type 2 diabetes mellitus with diabetic peripheral angiopathy without gangrene: Secondary | ICD-10-CM | POA: Diagnosis present

## 2019-11-17 DIAGNOSIS — E785 Hyperlipidemia, unspecified: Secondary | ICD-10-CM | POA: Diagnosis present

## 2019-11-17 DIAGNOSIS — E860 Dehydration: Secondary | ICD-10-CM | POA: Diagnosis present

## 2019-11-17 DIAGNOSIS — R338 Other retention of urine: Secondary | ICD-10-CM | POA: Diagnosis not present

## 2019-11-17 DIAGNOSIS — E119 Type 2 diabetes mellitus without complications: Secondary | ICD-10-CM | POA: Diagnosis not present

## 2019-11-17 DIAGNOSIS — Z8673 Personal history of transient ischemic attack (TIA), and cerebral infarction without residual deficits: Secondary | ICD-10-CM | POA: Diagnosis not present

## 2019-11-17 DIAGNOSIS — R1313 Dysphagia, pharyngeal phase: Secondary | ICD-10-CM | POA: Diagnosis present

## 2019-11-17 DIAGNOSIS — I2510 Atherosclerotic heart disease of native coronary artery without angina pectoris: Secondary | ICD-10-CM | POA: Diagnosis present

## 2019-11-17 DIAGNOSIS — C329 Malignant neoplasm of larynx, unspecified: Secondary | ICD-10-CM | POA: Diagnosis not present

## 2019-11-17 DIAGNOSIS — I252 Old myocardial infarction: Secondary | ICD-10-CM | POA: Diagnosis not present

## 2019-11-17 DIAGNOSIS — Z923 Personal history of irradiation: Secondary | ICD-10-CM | POA: Diagnosis not present

## 2019-11-17 DIAGNOSIS — J387 Other diseases of larynx: Secondary | ICD-10-CM | POA: Diagnosis not present

## 2019-11-17 DIAGNOSIS — Z431 Encounter for attention to gastrostomy: Secondary | ICD-10-CM | POA: Diagnosis not present

## 2019-11-17 DIAGNOSIS — E861 Hypovolemia: Secondary | ICD-10-CM | POA: Diagnosis present

## 2019-11-17 DIAGNOSIS — N401 Enlarged prostate with lower urinary tract symptoms: Secondary | ICD-10-CM | POA: Diagnosis not present

## 2019-11-17 DIAGNOSIS — Z95810 Presence of automatic (implantable) cardiac defibrillator: Secondary | ICD-10-CM

## 2019-11-17 DIAGNOSIS — J392 Other diseases of pharynx: Secondary | ICD-10-CM | POA: Diagnosis not present

## 2019-11-17 DIAGNOSIS — Z20822 Contact with and (suspected) exposure to covid-19: Secondary | ICD-10-CM | POA: Diagnosis present

## 2019-11-17 DIAGNOSIS — Z794 Long term (current) use of insulin: Secondary | ICD-10-CM | POA: Diagnosis not present

## 2019-11-17 DIAGNOSIS — Z79899 Other long term (current) drug therapy: Secondary | ICD-10-CM | POA: Diagnosis not present

## 2019-11-17 DIAGNOSIS — N184 Chronic kidney disease, stage 4 (severe): Secondary | ICD-10-CM | POA: Diagnosis present

## 2019-11-17 DIAGNOSIS — E1122 Type 2 diabetes mellitus with diabetic chronic kidney disease: Secondary | ICD-10-CM | POA: Diagnosis present

## 2019-11-17 DIAGNOSIS — N183 Chronic kidney disease, stage 3 unspecified: Secondary | ICD-10-CM | POA: Diagnosis not present

## 2019-11-17 DIAGNOSIS — R490 Dysphonia: Secondary | ICD-10-CM | POA: Diagnosis not present

## 2019-11-17 DIAGNOSIS — N179 Acute kidney failure, unspecified: Secondary | ICD-10-CM | POA: Diagnosis present

## 2019-11-17 DIAGNOSIS — M7732 Calcaneal spur, left foot: Secondary | ICD-10-CM | POA: Diagnosis not present

## 2019-11-17 DIAGNOSIS — M79672 Pain in left foot: Secondary | ICD-10-CM | POA: Diagnosis not present

## 2019-11-17 DIAGNOSIS — Y842 Radiological procedure and radiotherapy as the cause of abnormal reaction of the patient, or of later complication, without mention of misadventure at the time of the procedure: Secondary | ICD-10-CM | POA: Diagnosis not present

## 2019-11-17 DIAGNOSIS — I5020 Unspecified systolic (congestive) heart failure: Secondary | ICD-10-CM | POA: Diagnosis not present

## 2019-11-17 DIAGNOSIS — I129 Hypertensive chronic kidney disease with stage 1 through stage 4 chronic kidney disease, or unspecified chronic kidney disease: Secondary | ICD-10-CM | POA: Diagnosis not present

## 2019-11-17 DIAGNOSIS — I130 Hypertensive heart and chronic kidney disease with heart failure and stage 1 through stage 4 chronic kidney disease, or unspecified chronic kidney disease: Secondary | ICD-10-CM | POA: Diagnosis present

## 2019-11-17 DIAGNOSIS — J384 Edema of larynx: Secondary | ICD-10-CM | POA: Diagnosis present

## 2019-11-17 DIAGNOSIS — I110 Hypertensive heart disease with heart failure: Secondary | ICD-10-CM | POA: Diagnosis not present

## 2019-11-17 DIAGNOSIS — R634 Abnormal weight loss: Secondary | ICD-10-CM | POA: Diagnosis not present

## 2019-11-17 DIAGNOSIS — N400 Enlarged prostate without lower urinary tract symptoms: Secondary | ICD-10-CM | POA: Diagnosis present

## 2019-11-17 DIAGNOSIS — R1312 Dysphagia, oropharyngeal phase: Secondary | ICD-10-CM | POA: Diagnosis not present

## 2019-11-17 DIAGNOSIS — Z931 Gastrostomy status: Secondary | ICD-10-CM | POA: Diagnosis not present

## 2019-11-17 DIAGNOSIS — R627 Adult failure to thrive: Secondary | ICD-10-CM | POA: Diagnosis present

## 2019-11-17 DIAGNOSIS — K5900 Constipation, unspecified: Secondary | ICD-10-CM | POA: Diagnosis not present

## 2019-11-17 DIAGNOSIS — R531 Weakness: Secondary | ICD-10-CM | POA: Diagnosis not present

## 2019-11-17 DIAGNOSIS — Z8521 Personal history of malignant neoplasm of larynx: Secondary | ICD-10-CM | POA: Diagnosis not present

## 2019-11-18 ENCOUNTER — Other Ambulatory Visit: Payer: Self-pay

## 2019-11-18 ENCOUNTER — Ambulatory Visit
Admission: RE | Admit: 2019-11-18 | Discharge: 2019-11-18 | Disposition: A | Discharge status: Home / Self Care | Payer: Medicare HMO | Source: Ambulatory Visit | Attending: Radiation Oncology | Admitting: Radiation Oncology

## 2019-11-18 ENCOUNTER — Ambulatory Visit: Payer: Medicare HMO

## 2019-11-18 DIAGNOSIS — C320 Malignant neoplasm of glottis: Secondary | ICD-10-CM | POA: Diagnosis not present

## 2019-11-19 ENCOUNTER — Ambulatory Visit: Admission: RE | Admit: 2019-11-19 | Payer: Medicare HMO | Source: Ambulatory Visit

## 2019-11-19 ENCOUNTER — Encounter: Payer: Self-pay | Admitting: Radiation Oncology

## 2019-11-19 ENCOUNTER — Encounter: Payer: Self-pay | Admitting: *Deleted

## 2019-11-19 ENCOUNTER — Other Ambulatory Visit: Payer: Self-pay

## 2019-11-19 DIAGNOSIS — C320 Malignant neoplasm of glottis: Secondary | ICD-10-CM | POA: Diagnosis not present

## 2019-11-19 NOTE — Progress Notes (Signed)
Oncology Nurse Navigator Documentation  Met with [name] after final RT to offer support and to celebrate end of radiation treatment.   Provided verbal/written post-RT guidance:  Importance of keeping all follow-up appts, especially those with Nutrition and SLP.  Importance of protecting treatment area from sun.  Continuation of Sonafine application 2-3 times daily, application of abx ointment to areas of raw skin; when supply of Sonafine exhausted transition to OTC lotion with vitamin E. Provided/reviewed Epic calendar of upcoming appts, noted next Thursdays 2:00 appt at [facility] to meet with SLP. Explained my role as navigator will continue for several more months, encouraged him to call me with needs/concerns.   I later spoke with his sister, [name], reviewed the above.  She indicated she was aware of SLP appt and will coordinate ride.    Gayleen Orem, RN, BSN Head & Neck Oncology [place] at [place] [phone]

## 2019-11-19 NOTE — Progress Notes (Signed)
EPIC Encounter for ICM Monitoring  Patient Name: [name] is a 86 y.o. male Date: 11/19/2019 Primary Care Physican: Axel Filler, MD Primary Cardiologist:Taylor Electrophysiologist: Santina Evans Pacing: 94.1% 1/18/2021Weight:140lbs  Battery RRT 9 months  Transmission reviewed.   OptivolThoracic impedancetrending close to baseline normal.  Prescribed:Furosemide 20 mg 1 tablet twice a day.   Labs: 11/10/2019 Creatinine 2.09, BUN 33, Potassium 3.9, Sodium 145, GFR 28-32 08/15/2019 Creatinine1.92, BUN37, Potassium4.5, [id number], [id number]  07/31/2019 Creatinine1.54, BUN23, Potassium4.6, Sodium134, LPN30-05  07/30/2019 Creatinine1.79, BUN28, Potassium4.8, Sodium133, [id number]  07/29/2019 Creatinine2.22, BUN39, Potassium4.1, Sodium140, NBV67-01 A complete set of results can be found in Results Review.  Recommendations: None  Follow-up plan: ICM clinic phone appointment on3/29/2021. 91 day device clinic remote transmission 12/03/2019.   Copy of ICM check sent to Dr.Taylor.   3 month ICM trend: 11/17/2019    1 Year ICM trend:       [name], RN 11/19/2019 3:37 PM

## 2019-11-20 ENCOUNTER — Other Ambulatory Visit: Payer: Self-pay

## 2019-11-20 ENCOUNTER — Inpatient Hospital Stay (HOSPITAL_COMMUNITY)
Admission: EM | Admit: 2019-11-20 | Discharge: 2019-11-30 | DRG: 146 | Disposition: A | Discharge status: Home Health | Payer: Medicare HMO | Attending: Internal Medicine | Admitting: Internal Medicine

## 2019-11-20 ENCOUNTER — Encounter (HOSPITAL_COMMUNITY): Payer: Self-pay | Admitting: Emergency Medicine

## 2019-11-20 DIAGNOSIS — I5020 Unspecified systolic (congestive) heart failure: Secondary | ICD-10-CM | POA: Diagnosis not present

## 2019-11-20 DIAGNOSIS — I454 Nonspecific intraventricular block: Secondary | ICD-10-CM | POA: Diagnosis present

## 2019-11-20 DIAGNOSIS — T17920A Food in respiratory tract, part unspecified causing asphyxiation, initial encounter: Secondary | ICD-10-CM

## 2019-11-20 DIAGNOSIS — E861 Hypovolemia: Secondary | ICD-10-CM | POA: Diagnosis present

## 2019-11-20 DIAGNOSIS — M79672 Pain in left foot: Secondary | ICD-10-CM

## 2019-11-20 DIAGNOSIS — Z833 Family history of diabetes mellitus: Secondary | ICD-10-CM

## 2019-11-20 DIAGNOSIS — Z20822 Contact with and (suspected) exposure to covid-19: Secondary | ICD-10-CM | POA: Diagnosis present

## 2019-11-20 DIAGNOSIS — E1122 Type 2 diabetes mellitus with diabetic chronic kidney disease: Secondary | ICD-10-CM | POA: Diagnosis present

## 2019-11-20 DIAGNOSIS — Z87442 Personal history of urinary calculi: Secondary | ICD-10-CM

## 2019-11-20 DIAGNOSIS — N179 Acute kidney failure, unspecified: Secondary | ICD-10-CM | POA: Diagnosis present

## 2019-11-20 DIAGNOSIS — I252 Old myocardial infarction: Secondary | ICD-10-CM | POA: Diagnosis not present

## 2019-11-20 DIAGNOSIS — Z8521 Personal history of malignant neoplasm of larynx: Secondary | ICD-10-CM | POA: Diagnosis not present

## 2019-11-20 DIAGNOSIS — Z9841 Cataract extraction status, right eye: Secondary | ICD-10-CM

## 2019-11-20 DIAGNOSIS — E785 Hyperlipidemia, unspecified: Secondary | ICD-10-CM | POA: Diagnosis present

## 2019-11-20 DIAGNOSIS — J384 Edema of larynx: Secondary | ICD-10-CM | POA: Diagnosis present

## 2019-11-20 DIAGNOSIS — Z9842 Cataract extraction status, left eye: Secondary | ICD-10-CM

## 2019-11-20 DIAGNOSIS — Z794 Long term (current) use of insulin: Secondary | ICD-10-CM

## 2019-11-20 DIAGNOSIS — I2510 Atherosclerotic heart disease of native coronary artery without angina pectoris: Secondary | ICD-10-CM | POA: Diagnosis present

## 2019-11-20 DIAGNOSIS — Z86010 Personal history of colonic polyps: Secondary | ICD-10-CM

## 2019-11-20 DIAGNOSIS — R634 Abnormal weight loss: Secondary | ICD-10-CM | POA: Diagnosis present

## 2019-11-20 DIAGNOSIS — Z955 Presence of coronary angioplasty implant and graft: Secondary | ICD-10-CM

## 2019-11-20 DIAGNOSIS — I129 Hypertensive chronic kidney disease with stage 1 through stage 4 chronic kidney disease, or unspecified chronic kidney disease: Secondary | ICD-10-CM | POA: Diagnosis not present

## 2019-11-20 DIAGNOSIS — R627 Adult failure to thrive: Secondary | ICD-10-CM | POA: Diagnosis present

## 2019-11-20 DIAGNOSIS — N400 Enlarged prostate without lower urinary tract symptoms: Secondary | ICD-10-CM | POA: Diagnosis present

## 2019-11-20 DIAGNOSIS — E860 Dehydration: Secondary | ICD-10-CM | POA: Diagnosis present

## 2019-11-20 DIAGNOSIS — R490 Dysphonia: Secondary | ICD-10-CM | POA: Diagnosis not present

## 2019-11-20 DIAGNOSIS — E119 Type 2 diabetes mellitus without complications: Secondary | ICD-10-CM | POA: Diagnosis not present

## 2019-11-20 DIAGNOSIS — N184 Chronic kidney disease, stage 4 (severe): Secondary | ICD-10-CM | POA: Diagnosis present

## 2019-11-20 DIAGNOSIS — E43 Unspecified severe protein-calorie malnutrition: Secondary | ICD-10-CM | POA: Diagnosis present

## 2019-11-20 DIAGNOSIS — Z923 Personal history of irradiation: Secondary | ICD-10-CM

## 2019-11-20 DIAGNOSIS — C320 Malignant neoplasm of glottis: Secondary | ICD-10-CM | POA: Diagnosis present

## 2019-11-20 DIAGNOSIS — Z87891 Personal history of nicotine dependence: Secondary | ICD-10-CM

## 2019-11-20 DIAGNOSIS — Z95810 Presence of automatic (implantable) cardiac defibrillator: Secondary | ICD-10-CM | POA: Diagnosis not present

## 2019-11-20 DIAGNOSIS — N183 Chronic kidney disease, stage 3 unspecified: Secondary | ICD-10-CM | POA: Diagnosis present

## 2019-11-20 DIAGNOSIS — I110 Hypertensive heart disease with heart failure: Secondary | ICD-10-CM | POA: Diagnosis not present

## 2019-11-20 DIAGNOSIS — R1313 Dysphagia, pharyngeal phase: Secondary | ICD-10-CM | POA: Diagnosis present

## 2019-11-20 DIAGNOSIS — J387 Other diseases of larynx: Secondary | ICD-10-CM | POA: Diagnosis not present

## 2019-11-20 DIAGNOSIS — I130 Hypertensive heart and chronic kidney disease with heart failure and stage 1 through stage 4 chronic kidney disease, or unspecified chronic kidney disease: Secondary | ICD-10-CM | POA: Diagnosis present

## 2019-11-20 DIAGNOSIS — Z8673 Personal history of transient ischemic attack (TIA), and cerebral infarction without residual deficits: Secondary | ICD-10-CM | POA: Diagnosis not present

## 2019-11-20 DIAGNOSIS — E1151 Type 2 diabetes mellitus with diabetic peripheral angiopathy without gangrene: Secondary | ICD-10-CM | POA: Diagnosis present

## 2019-11-20 DIAGNOSIS — R112 Nausea with vomiting, unspecified: Secondary | ICD-10-CM | POA: Diagnosis not present

## 2019-11-20 DIAGNOSIS — R531 Weakness: Secondary | ICD-10-CM

## 2019-11-20 DIAGNOSIS — E46 Unspecified protein-calorie malnutrition: Secondary | ICD-10-CM | POA: Diagnosis not present

## 2019-11-20 DIAGNOSIS — Z931 Gastrostomy status: Secondary | ICD-10-CM | POA: Diagnosis not present

## 2019-11-20 DIAGNOSIS — Z85819 Personal history of malignant neoplasm of unspecified site of lip, oral cavity, and pharynx: Secondary | ICD-10-CM

## 2019-11-20 DIAGNOSIS — R1310 Dysphagia, unspecified: Secondary | ICD-10-CM

## 2019-11-20 DIAGNOSIS — Z79899 Other long term (current) drug therapy: Secondary | ICD-10-CM

## 2019-11-20 DIAGNOSIS — Z4502 Encounter for adjustment and management of automatic implantable cardiac defibrillator: Secondary | ICD-10-CM

## 2019-11-20 DIAGNOSIS — I5022 Chronic systolic (congestive) heart failure: Secondary | ICD-10-CM | POA: Diagnosis present

## 2019-11-20 DIAGNOSIS — T17928A Food in respiratory tract, part unspecified causing other injury, initial encounter: Secondary | ICD-10-CM

## 2019-11-20 DIAGNOSIS — Z961 Presence of intraocular lens: Secondary | ICD-10-CM | POA: Diagnosis present

## 2019-11-20 LAB — URINALYSIS, ROUTINE W REFLEX MICROSCOPIC
Bilirubin Urine: NEGATIVE
Glucose, UA: NEGATIVE mg/dL
Ketones, ur: NEGATIVE mg/dL
Nitrite: NEGATIVE
Protein, ur: 100 mg/dL — AB
Specific Gravity, Urine: 1.011 (ref 1.005–1.030)
WBC, UA: >50 WBC/hpf — ABNORMAL HIGH (ref 0–5)
pH: 7 (ref 5.0–8.0)

## 2019-11-20 LAB — HEMOGLOBIN A1C
Hgb A1c MFr Bld: 7.7 % — ABNORMAL HIGH (ref 4.8–5.6)
Mean Plasma Glucose: 174.29 mg/dL

## 2019-11-20 LAB — CBC
HCT: 38.8 % — ABNORMAL LOW (ref 39.0–52.0)
Hemoglobin: 11.7 g/dL — ABNORMAL LOW (ref 13.0–17.0)
MCH: 29.3 pg (ref 26.0–34.0)
MCHC: 30.2 g/dL (ref 30.0–36.0)
MCV: 97 fL (ref 80.0–100.0)
Platelets: 240 10*3/uL (ref 150–400)
RBC: 4 MIL/uL — ABNORMAL LOW (ref 4.22–5.81)
RDW: 13.9 % (ref 11.5–15.5)
WBC: 7.3 10*3/uL (ref 4.0–10.5)
nRBC: 0 % (ref 0.0–0.2)

## 2019-11-20 LAB — BASIC METABOLIC PANEL
Anion gap: 13 (ref 5–15)
BUN: 37 mg/dL — ABNORMAL HIGH (ref 8–23)
CO2: 28 mmol/L (ref 22–32)
Calcium: 9.9 mg/dL (ref 8.9–10.3)
Chloride: 103 mmol/L (ref 98–111)
Creatinine, Ser: 2.33 mg/dL — ABNORMAL HIGH (ref 0.61–1.24)
GFR calc Af Amer: 28 mL/min — ABNORMAL LOW (ref >60)
GFR calc non Af Amer: 24 mL/min — ABNORMAL LOW (ref >60)
Glucose, Bld: 203 mg/dL — ABNORMAL HIGH (ref 70–99)
Potassium: 4.6 mmol/L (ref 3.5–5.1)
Sodium: 144 mmol/L (ref 135–145)

## 2019-11-20 LAB — GLUCOSE, CAPILLARY
Glucose-Capillary: 138 mg/dL — ABNORMAL HIGH (ref 70–99)
Glucose-Capillary: 184 mg/dL — ABNORMAL HIGH (ref 70–99)

## 2019-11-20 LAB — CBG MONITORING, ED
Glucose-Capillary: 152 mg/dL — ABNORMAL HIGH (ref 70–99)
Glucose-Capillary: 163 mg/dL — ABNORMAL HIGH (ref 70–99)

## 2019-11-20 LAB — LIPASE, BLOOD: Lipase: 26 U/L (ref 11–51)

## 2019-11-20 LAB — SARS CORONAVIRUS 2 (TAT 6-24 HRS): SARS Coronavirus 2: NEGATIVE

## 2019-11-20 MED ORDER — FUROSEMIDE 20 MG PO TABS: 40.0000 mg | ORAL_TABLET | Freq: Two times a day (BID) | ORAL | Status: DC

## 2019-11-20 MED ORDER — OXYCODONE HCL 5 MG PO TABS: 5.0000 mg | ORAL_TABLET | ORAL | Status: DC | PRN

## 2019-11-20 MED ORDER — SENNOSIDES-DOCUSATE SODIUM 8.6-50 MG PO TABS: 2.0000 | ORAL_TABLET | Freq: Every day | ORAL | Status: DC | PRN

## 2019-11-20 MED ORDER — OFLOXACIN 0.3 % OP SOLN
1.0000 [drp] | Freq: Four times a day (QID) | OPHTHALMIC | Status: DC
  Administered 2019-11-21 – 2019-11-30 (×34): 1 [drp] via OPHTHALMIC
  Filled 2019-11-20 (×3): qty 5

## 2019-11-20 MED ORDER — PANTOPRAZOLE SODIUM 40 MG PO TBEC
40.0000 mg | DELAYED_RELEASE_TABLET | Freq: Every day | ORAL | Status: DC
  Administered 2019-11-21 – 2019-11-29 (×7): 40 mg via ORAL
  Filled 2019-11-20 (×8): qty 1

## 2019-11-20 MED ORDER — ENSURE ENLIVE PO LIQD
237.0000 mL | Freq: Two times a day (BID) | ORAL | Status: DC
  Administered 2019-11-21: 237 mL via ORAL
  Filled 2019-11-20: qty 237

## 2019-11-20 MED ORDER — ALBUTEROL SULFATE (2.5 MG/3ML) 0.083% IN NEBU: 2.5000 mg | INHALATION_SOLUTION | Freq: Four times a day (QID) | RESPIRATORY_TRACT | Status: DC | PRN

## 2019-11-20 MED ORDER — ACETAMINOPHEN 650 MG RE SUPP: 650.0000 mg | Freq: Four times a day (QID) | RECTAL | Status: DC | PRN

## 2019-11-20 MED ORDER — FLUOROMETHOLONE 0.1 % OP SUSP
2.0000 [drp] | Freq: Two times a day (BID) | OPHTHALMIC | Status: DC
  Administered 2019-11-21 – 2019-11-30 (×17): 2 [drp] via OPHTHALMIC
  Filled 2019-11-20 (×3): qty 5

## 2019-11-20 MED ORDER — SODIUM CHLORIDE 0.9% FLUSH: 3.0000 mL | Freq: Once | INTRAVENOUS | Status: DC

## 2019-11-20 MED ORDER — LIDOCAINE VISCOUS HCL 2 % MT SOLN: 15.0000 mL | OROMUCOSAL | Status: DC | PRN

## 2019-11-20 MED ORDER — THIAMINE HCL 100 MG/ML IJ SOLN
INTRAVENOUS | Status: DC
  Filled 2019-11-20: qty 1000

## 2019-11-20 MED ORDER — CARVEDILOL 6.25 MG PO TABS
6.2500 mg | ORAL_TABLET | Freq: Two times a day (BID) | ORAL | Status: DC
  Administered 2019-11-20 – 2019-11-29 (×17): 6.25 mg via ORAL
  Filled 2019-11-20 (×17): qty 1

## 2019-11-20 MED ORDER — SODIUM CHLORIDE 0.9 % IV BOLUS
1000.0000 mL | Freq: Once | INTRAVENOUS | Status: AC
  Administered 2019-11-20: 16:00:00 1000 mL via INTRAVENOUS

## 2019-11-20 MED ORDER — INSULIN ASPART 100 UNIT/ML ~~LOC~~ SOLN
0.0000 [IU] | Freq: Three times a day (TID) | SUBCUTANEOUS | Status: DC
  Administered 2019-11-20 – 2019-11-21 (×3): 2 [IU] via SUBCUTANEOUS
  Administered 2019-11-22 (×2): 1 [IU] via SUBCUTANEOUS
  Administered 2019-11-23 – 2019-11-24 (×4): 2 [IU] via SUBCUTANEOUS
  Administered 2019-11-24: 1 [IU] via SUBCUTANEOUS
  Administered 2019-11-25: 5 [IU] via SUBCUTANEOUS
  Administered 2019-11-25: 1 [IU] via SUBCUTANEOUS

## 2019-11-20 MED ORDER — POLYETHYLENE GLYCOL 3350 17 G PO PACK
17.0000 g | PACK | Freq: Every day | ORAL | Status: DC
  Administered 2019-11-20 – 2019-11-28 (×6): 17 g via ORAL
  Filled 2019-11-20 (×8): qty 1

## 2019-11-20 MED ORDER — HEPARIN SODIUM (PORCINE) 5000 UNIT/ML IJ SOLN
5000.0000 [IU] | Freq: Two times a day (BID) | INTRAMUSCULAR | Status: DC
  Administered 2019-11-20 – 2019-11-25 (×9): 5000 [IU] via SUBCUTANEOUS
  Filled 2019-11-20 (×10): qty 1

## 2019-11-20 MED ORDER — DRONABINOL 2.5 MG PO CAPS
2.5000 mg | ORAL_CAPSULE | Freq: Two times a day (BID) | ORAL | Status: DC
  Administered 2019-11-20 – 2019-11-29 (×13): 2.5 mg via ORAL
  Filled 2019-11-20 (×15): qty 1

## 2019-11-20 MED ORDER — TAMSULOSIN HCL 0.4 MG PO CAPS
0.8000 mg | ORAL_CAPSULE | Freq: Every day | ORAL | Status: DC
  Administered 2019-11-20 – 2019-11-28 (×8): 0.8 mg via ORAL
  Filled 2019-11-20 (×8): qty 2

## 2019-11-20 MED ORDER — ACETAMINOPHEN 325 MG PO TABS: 650.0000 mg | ORAL_TABLET | Freq: Four times a day (QID) | ORAL | Status: DC | PRN

## 2019-11-20 MED ORDER — ATORVASTATIN CALCIUM 40 MG PO TABS
40.0000 mg | ORAL_TABLET | Freq: Every day | ORAL | Status: DC
  Administered 2019-11-21 – 2019-11-29 (×9): 40 mg via ORAL
  Filled 2019-11-20 (×9): qty 1

## 2019-11-20 NOTE — ED Provider Notes (Signed)
[facility] EMERGENCY DEPARTMENT Provider Note   CSN: [id number] Arrival date & time: 11/20/19  1223     History Chief Complaint  Patient presents with  . Emesis  . Weakness    [name] is a 86 y.o. male w/ hx of laryngeal cancer s/p radiation (last session was yesterday), HTN, HLD, DM2, CHF s/p ICD, presenting to the ED with failure to thrive. His son Delman [phone]) provides additional history as the patient has a hard time speaking.  Apparently for the past 2 weeks, the patient has had a significant decline in his PO intake.  This has corresponded with his radiation treatments.  The patient reports he is nauseated and vomits as soon as he eats, but his son tells me he believes this is a combination of pain and choking, which leads to vomiting.  His son has been mixing purees in a blender and trying Ensure at home, but the patient is refusing to take any.  They live together and have no other help.  His son works 12 hours a day, and when he returns he tries to help his dad eat, but they can't keep food down.  His son is concerned the patient has lost about 9 pounds in the past 2 weeks and is "losing weight like crazy."  He is concerned his father may be dehydrated.  He is also concerned the patient seems to be struggling with his medications.  The patient denies any active abdominal pain or nausea.  He denies fevers, chills, or cough.    HPI     Past Medical History:  Diagnosis Date  . Adenomatous colon polyp 02/14/2012  . AICD (automatic cardioverter/defibrillator) present    Medtronic   . BBB (bundle branch block)    right  . Carotid stenosis    a. s/p L carotid stent 2004;  b. Carotid US (09/2014): Bilateral ICA 1-39%, left ECA >59%, normal subclavian bilaterally, occluded left vertebral >> FU 2 years  . Chronic kidney disease   . Chronic systolic CHF (congestive heart failure) (HCC)    a. ischemic CM EF 15-20%;  b. s/p AICD 05/24/04;  c. Echo  7/06: EF 30-40%, mild reduced RVSF, d. Echo 12/2015 EF 35-40%  . Elevated PSA   . History of kidney stones    x 1  . HTN (hypertension)   . Hyperlipidemia   . ICD (implantable cardiac defibrillator) in place 12-25-2012   MDT CRTD upgrade by Dr Lovena Le  . Myocardial infarction ([place]) 1990  . Pericardial effusion    Echocardiogram (09/2014): EF 25% with distal anterior, distal inferior, distal lateral and apical akinesis, grade 1 diastolic dysfunction, very mild aortic stenosis (mean 7 mmHg) - this may be depressed due to low EF (2-D images suggest mild to moderate aortic stenosis), large pericardial effusion, no RA collapse  . Pneumonia   . Presence of permanent cardiac pacemaker    Medtronic  . PVD (peripheral vascular disease) ([place])    s/p L carotid PTCA/stent 2004  . Transient ischemic attack   . Type II diabetes mellitus ([place])    type II    Patient Active Problem List   Diagnosis Date Noted  . Failure to thrive in adult 11/20/2019  . Enlarged prostate   . 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 Pharyngeal dysphagia 07/10/2019  . Dysphonia 07/10/2019  . Hypertensive retinopathy of both eyes 02/05/2018  . Mild nonproliferative diabetic retinopathy of both eyes without macular edema associated  with type 2 diabetes mellitus ([place]) 02/05/2018  . Dry eye syndrome of both eyes 06/06/2017  . Weight loss 12/11/2016  . Advanced care planning/counseling discussion 12/11/2016  . Urinary retention 01/13/2016  . Healthcare maintenance 11/22/2015  . [name] (chronic kidney disease) stage 3, GFR 30-59 ml/min (HCC) 12/04/2014  . Automatic implantable cardioverter-defibrillator in situ 01/16/2012  . Constipation 03/10/2009  . Coronary atherosclerosis 12/25/2007  . Chronic systolic congestive heart failure ([place]) 12/25/2007  . Hyperlipidemia 01/02/2007  . Type 2 diabetes mellitus with peripheral vascular disease ([place]) 01/01/1989    Past Surgical History:  Procedure Laterality Date  .  BI-VENTRICULAR IMPLANTABLE CARDIOVERTER DEFIBRILLATOR UPGRADE N/A 12/25/2012   Procedure: BI-VENTRICULAR IMPLANTABLE CARDIOVERTER DEFIBRILLATOR UPGRADE;  Surgeon: Evans Lance, MD;  Location: [facility] CATH LAB;  Service: Cardiovascular;  Laterality: N/A;  . BIV ICD UPGRADE  12/25/2012   MDT CRTD upgrade by Dr Lovena Le for ischemic cardiomyopathy and worsening conduction system disease  . CARDIAC CATHETERIZATION  06/2003,  01/2004  . CARDIAC CATHETERIZATION N/A 01/13/2016   Procedure: Left Heart Cath and Coronary Angiography;  Surgeon: Jettie Booze, MD;  Location: [facility] CV LAB;  Service: Cardiovascular;  Laterality: N/A;  . CARDIAC DEFIBRILLATOR PLACEMENT  05/24/2004   Implantation of a MDT single-chamber defibrillator  . CAROTID STENT  09/11/2003   Percutaneous transluminal angioplasty and stent placement of the left internal carotid artery.  Marland Kitchen CATARACT EXTRACTION W/ INTRAOCULAR LENS  IMPLANT, BILATERAL Bilateral ~ 2010  . CORONARY ANGIOPLASTY WITH STENT PLACEMENT  1990   "2" (12/25/2012)  . CYSTOSCOPY    . DIRECT LARYNGOSCOPY N/A 08/15/2019   Procedure: MICRODIRECT LARYNGOSCOPY WITH BIOPSY;  Surgeon: Melida Quitter, MD;  Location: [facility];  Service: ENT;  Laterality: N/A;  . INSERT / REPLACE / REMOVE PACEMAKER    . LEAD REVISION N/A 12/25/2012   Procedure: LEAD REVISION;  Surgeon: Evans Lance, MD;  Location: [facility] CATH LAB;  Service: Cardiovascular;  Laterality: N/A;  . RIGID ESOPHAGOSCOPY N/A 08/15/2019   Procedure: RIGID ESOPHAGOSCOPY;  Surgeon: Melida Quitter, MD;  Location: [facility] OR;  Service: ENT;  Laterality: N/A;       Family History  Problem Relation Age of Onset  . Diabetes Mother   . Healthy Father   . Diabetes Brother   . Heart attack Neg Hx   . Stroke Neg Hx     Social History   Tobacco Use  . Smoking status: Former Smoker    Types: Cigarettes    Quit date: 12/27/1967    Years since quitting: 51.9  . Smokeless tobacco: Never Used  Substance Use Topics  . Alcohol use: No     Alcohol/week: 0.0 standard drinks    Comment: 12/25/2012 "quit all alcohol 60 yr ago"  . Drug use: No    Home Medications Prior to Admission medications   Medication Sig Start Date End Date Taking? Authorizing Provider  atorvastatin (LIPITOR) 40 MG tablet TAKE 1 TABLET(40 MG) BY MOUTH DAILY AT 6 PM Patient taking differently: Take 40 mg by mouth daily at 6 PM.  03/03/19  Yes Axel Filler, MD  bisacodyl (DULCOLAX) 5 MG EC tablet Take 2 tablets (10 mg total) by mouth daily. 07/31/19 07/30/20 Yes Georgette Shell, MD  carvedilol (COREG) 6.25 MG tablet Take 1 tablet (6.25 mg total) by mouth 2 (two) times daily with a meal. 07/31/19  Yes Georgette Shell, MD  fluorometholone (FML) 0.1 % ophthalmic suspension Place 2 drops into both eyes 2 (two) times daily.  Yes [provider]  furosemide (LASIX) 40 MG tablet Take 1 tablet (40 mg total) by mouth 2 (two) times daily. 04/14/19  Yes Evans Lance, MD  insulin glargine (LANTUS) 100 UNIT/ML injection Inject 0.07 mLs (7 Units total) into the skin daily. 05/27/19  Yes Madalyn Rob, MD  lidocaine (XYLOCAINE) 2 % solution Patient: Mix 1part 2% viscous lidocaine, 1part H20. Swallow 46m of diluted mixture, 353m before meals and at bedtime, up to QID 10/27/19  Yes SqEppie GibsonMD  ofloxacin (OCUFLOX) 0.3 % ophthalmic solution 1 drop 4 (four) times daily. 11/06/19  Yes [provider]  pantoprazole (PROTONIX) 40 MG tablet Take 1 tablet (40 mg total) by mouth daily. 10/07/19  Yes ViAxel FillerMD  polyethylene glycol (MIRALAX / GLYCOLAX) 17 g packet Take 17 g by mouth daily. 07/31/19  Yes MaGeorgette ShellMD  senna-docusate (SENOKOT-S) 8.6-50 MG tablet Take 2 tablets by mouth daily. Patient taking differently: Take 2 tablets by mouth daily as needed for mild constipation.  05/04/16  Yes PaKonrad FelixPA  tamsulosin (FLOMAX) 0.4 MG CAPS capsule Take 0.8 mg by mouth at bedtime.    Yes [provider]   VENTOLIN HFA 108 (90 Base) MCG/ACT inhaler INHALE 2 PUFFS INTO THE LUNGS EVERY 6 HOURS AS NEEDED FOR WHEEZING OR SHORTNESS OF BREATH Patient taking differently: Inhale 2 puffs into the lungs every 6 (six) hours as needed for wheezing or shortness of breath.  03/15/16  Yes ViAxel FillerMD  alum & mag hydroxide-simeth (MAALOX/MYLANTA) 200-200-20 MG/5ML suspension Take 15 mLs by mouth every 6 (six) hours as needed for indigestion or heartburn. Patient not taking: Reported on 11/20/2019 03/27/19   Wieters, HaMadelynn Done, PA-C  Blood Glucose Monitoring Suppl (ACCU-CHEK AVIVA PLUS) w/Device KIT Check finger stick glucose once daily 05/06/18   ViAxel FillerMD  glucose blood (ACCU-CHEK AVIVA PLUS) test strip Check blood sugar up to 3 times a day 09/15/19   ViAxel FillerMD  Hydrocortisone, Perianal, (PROCTO-PAK) 1 % CREA Apply 1 application topically 2 (two) times daily. Patient not taking: Reported on 11/20/2019 07/25/19   NeRaylene EvertsMD  Insulin Pen Needle (B-D ULTRAFINE III SHORT PEN) 31G X 8 MM MISC USE ONCE DAILY WITH LANTUS PEN 05/06/18   ViAxel FillerMD  lidocaine (XYLOCAINE) 2 % jelly Apply 1 application topically as needed. Patient not taking: Reported on 11/20/2019 07/25/19   NeRaylene EvertsMD    Allergies    Patient has no known allergies.  Review of Systems   Review of Systems  Constitutional: Negative for chills and fever.  HENT: Positive for sore throat, trouble swallowing and voice change.   Respiratory: Negative for cough and shortness of breath.   Cardiovascular: Negative for chest pain and palpitations.  Gastrointestinal: Positive for nausea and vomiting. Negative for abdominal pain.  Musculoskeletal: Negative for arthralgias and back pain.  Skin: Negative for pallor and rash.  All other systems reviewed and are negative.   Physical Exam Updated Vital Signs BP (!) 117/59 (BP Location: Left Arm)   Pulse 73   Temp 98.6 F (37 C)  (Oral)   Resp 14   Ht '5\' 11"'$  (1.803 m)   Wt 64.2 kg   SpO2 100%   BMI 19.74 kg/m   Physical Exam Vitals and nursing note reviewed.  Constitutional:      Appearance: He is well-developed.     Comments: Thin, frail appearing Hoarse voice  HENT:  Head: Normocephalic and atraumatic.     Comments: Tachy mucous membranes Eyes:     Conjunctiva/sclera: Conjunctivae normal.  Cardiovascular:     Rate and Rhythm: Regular rhythm. Tachycardia present.     Pulses: Normal pulses.  Pulmonary:     Effort: Pulmonary effort is normal. No respiratory distress.     Breath sounds: Normal breath sounds.  Abdominal:     General: Abdomen is flat.     Tenderness: There is no abdominal tenderness. There is no guarding.  Musculoskeletal:     Cervical back: Neck supple.  Skin:    General: Skin is warm and dry.  Neurological:     Mental Status: He is alert and oriented to person, place, and time.     ED Results / Procedures / Treatments   Labs (all labs ordered are listed, but only abnormal results are displayed) Labs Reviewed  BASIC METABOLIC PANEL - Abnormal; Notable for the following components:      Result Value   Glucose, Bld 203 (*)    BUN 37 (*)    Creatinine, Ser 2.33 (*)    GFR calc non Af Amer 24 (*)    GFR calc Af Amer 28 (*)    All other components within normal limits  CBC - Abnormal; Notable for the following components:   RBC 4.00 (*)    Hemoglobin 11.7 (*)    HCT 38.8 (*)    All other components within normal limits  URINALYSIS, ROUTINE W REFLEX MICROSCOPIC - Abnormal; Notable for the following components:   APPearance CLOUDY (*)    Hgb urine dipstick SMALL (*)    Protein, ur 100 (*)    Leukocytes,Ua MODERATE (*)    WBC, UA >50 (*)    Bacteria, UA MANY (*)    All other components within normal limits  HEMOGLOBIN A1C - Abnormal; Notable for the following components:   Hgb A1c MFr Bld 7.7 (*)    All other components within normal limits  GLUCOSE, CAPILLARY -  Abnormal; Notable for the following components:   Glucose-Capillary 138 (*)    All other components within normal limits  GLUCOSE, CAPILLARY - Abnormal; Notable for the following components:   Glucose-Capillary 184 (*)    All other components within normal limits  CBG MONITORING, ED - Abnormal; Notable for the following components:   Glucose-Capillary 152 (*)    All other components within normal limits  CBG MONITORING, ED - Abnormal; Notable for the following components:   Glucose-Capillary 163 (*)    All other components within normal limits  SARS CORONAVIRUS 2 (TAT 6-24 HRS)  LIPASE, BLOOD  BASIC METABOLIC PANEL    EKG EKG Interpretation  Date/Time:  Thursday November 20 2019 12:40:23 EST Ventricular Rate:  99 PR Interval:  120 QRS Duration: 166 QT Interval:  434 QTC Calculation: 556 R Axis:   -92 Text Interpretation: Atrial-sensed ventricular-paced rhythm Biventricular pacemaker detected Abnormal ECG No significant change since last tracing Confirmed by Octaviano Glow [phone]) on 11/20/2019 3:04:14 PM   Radiology No results found.  Procedures Procedures (including critical care time)  Medications Ordered in ED Medications  sodium chloride flush (NS) 0.9 % injection 3 mL (3 mLs Intravenous Not Given 11/20/19 1518)  atorvastatin (LIPITOR) tablet 40 mg (has no administration in time range)  carvedilol (COREG) tablet 6.25 mg (6.25 mg Oral Given 11/20/19 1846)  pantoprazole (PROTONIX) EC tablet 40 mg (has no administration in time range)  polyethylene glycol (MIRALAX / GLYCOLAX) packet 17 g (17  g Oral Given 11/20/19 2234)  senna-docusate (Senokot-S) tablet 2 tablet (has no administration in time range)  tamsulosin (FLOMAX) capsule 0.8 mg (0.8 mg Oral Given 11/20/19 2234)  albuterol (PROVENTIL) (2.5 MG/3ML) 0.083% nebulizer solution 2.5 mg (has no administration in time range)  fluorometholone (FML) 0.1 % ophthalmic suspension 2 drop (2 drops Both Eyes Not Given 11/21/19 0029)   lidocaine (XYLOCAINE) 2 % viscous mouth solution 15 mL (has no administration in time range)  ofloxacin (OCUFLOX) 0.3 % ophthalmic solution 1 drop (1 drop Both Eyes Given 11/21/19 0028)  heparin injection 5,000 Units (5,000 Units Subcutaneous Given 11/20/19 2234)  acetaminophen (TYLENOL) tablet 650 mg (has no administration in time range)    Or  acetaminophen (TYLENOL) suppository 650 mg (has no administration in time range)  oxyCODONE (Oxy IR/ROXICODONE) immediate release tablet 5 mg (has no administration in time range)  insulin aspart (novoLOG) injection 0-9 Units (2 Units Subcutaneous Given 11/20/19 1847)  dronabinol (MARINOL) capsule 2.5 mg (2.5 mg Oral Given 11/20/19 1846)  feeding supplement (ENSURE ENLIVE) (ENSURE ENLIVE) liquid 237 mL (has no administration in time range)  sodium chloride 0.9 % 1,000 mL with thiamine 031 mg, folic acid 1 mg, multivitamins adult 10 mL infusion ( Intravenous New Bag/Given 11/20/19 1753)  sodium chloride 0.9 % bolus 1,000 mL (0 mLs Intravenous Stopped 11/20/19 1747)    ED Course  I have reviewed the triage vital signs and the nursing notes.  Pertinent labs & imaging results that were available during my care of the patient were reviewed by me and considered in my medical decision making (see chart for details).  86 yo male presenting with failure to thrive/poor PO intake while undergoing radiation therapy for laryngeal cancer.  Losing weight in past 2 weeks.  Patient thin on exam, appears dehydrated (tachy membranes, mildly tachycardic)  He has no focal abdominal tenderness to suggest an acute intraabdominal process such as cholecystitis, pancreatitis, gastritis, or ulcer as a cause of his symptoms.  He is not actively in pain.  I suspect this may be related to his radiation therapy, a combination of pain-induced nausea and dysphagia.  I am concerned he can't keep down any food or fluids at home, and their family doesn't have the means to address this here.   I discussed with his son the possibility that this is simply pain induced from his radiation, and with completion of the therapy the symptoms may improve.  However, there are still concerns about the patient choking, aspirating, not taking his medications, and losing weight.  We'll give him some fluids for his worsening renal function (progressive rising Cr over past few months, although past years show Cr ranging from 1.5 to 1.8 to 2.1 at times), and admit for Speech and Swallow evaluation, potential consideration for PEG tube, which his son is amenable to.  No infectious symptoms, doubt this is COVID or infection- related.   Final Clinical Impression(s) / ED Diagnoses Final diagnoses:  Weakness  Dehydration  Dysphagia, unspecified type  Odynophagia    Rx / DC Orders ED Discharge Orders    None       Rollin Kotowski, Carola Rhine, MD 11/21/19 [phone]

## 2019-11-20 NOTE — H&P (Signed)
History and Physical    [name] IYM:[id number] DOB: 08-25-1931 DOA: 11/20/2019  PCP: Axel Filler, MD   Patient coming from: Home  I have personally briefly reviewed patient's old medical records in [place]  Chief Complaint: Weight loss, hard to swallow  HPI: [name] is a 86 y.o. male with medical history significant of laryngeal cancer s/p radiation (last session was yesterday), HTN, HLD, DM2, CHF s/p ICD, presented the ED with failure to thrive.  Patient reports severe throat pain, and muffled voice, thus most of the history was provided by his son [name] [phone]).    Patient started radiation therapy on January 26, daily for 20 days except weekends, which he completed yesterday. Since the beginning of the radiation therapy, gradually the patient has had a significant decline in his PO intake and lost about 9 pounds in the past 2 weeks.  For last for 5 days, the patient has had trouble to drink fluid such as milk and Ensure in addition to the soft diet he was able to tolerate earlier.  Patient was prescribed with lidocaine gel for his throat pain, without significant improvement.  Denied any chest pain, no cough, no fever chills, no abdominal pain, no feeling of nauseous or vomiting.  ED Course: IV fluid was started  Review of Systems: As per HPI otherwise 10 point review of systems negative.    Past Medical History:  Diagnosis Date  . Adenomatous colon polyp 02/14/2012  . AICD (automatic cardioverter/defibrillator) present    Medtronic   . BBB (bundle branch block)    right  . Carotid stenosis    a. s/p L carotid stent 2004;  b. Carotid US (09/2014): Bilateral ICA 1-39%, left ECA >59%, normal subclavian bilaterally, occluded left vertebral >> FU 2 years  . Chronic kidney disease   . Chronic systolic CHF (congestive heart failure) (HCC)    a. ischemic CM EF 15-20%;  b. s/p AICD 05/24/04;  c. Echo 7/06: EF 30-40%, mild reduced RVSF, d. Echo  12/2015 EF 35-40%  . Elevated PSA   . History of kidney stones    x 1  . HTN (hypertension)   . Hyperlipidemia   . ICD (implantable cardiac defibrillator) in place 12-25-2012   MDT CRTD upgrade by Dr Lovena Le  . Myocardial infarction ([place]) 1990  . Pericardial effusion    Echocardiogram (09/2014): EF 25% with distal anterior, distal inferior, distal lateral and apical akinesis, grade 1 diastolic dysfunction, very mild aortic stenosis (mean 7 mmHg) - this may be depressed due to low EF (2-D images suggest mild to moderate aortic stenosis), large pericardial effusion, no RA collapse  . Pneumonia   . Presence of permanent cardiac pacemaker    Medtronic  . PVD (peripheral vascular disease) ([place])    s/p L carotid PTCA/stent 2004  . Transient ischemic attack   . Type II diabetes mellitus ([place])    type II    Past Surgical History:  Procedure Laterality Date  . BI-VENTRICULAR IMPLANTABLE CARDIOVERTER DEFIBRILLATOR UPGRADE N/A 12/25/2012   Procedure: BI-VENTRICULAR IMPLANTABLE CARDIOVERTER DEFIBRILLATOR UPGRADE;  Surgeon: Evans Lance, MD;  Location: [facility] CATH LAB;  Service: Cardiovascular;  Laterality: N/A;  . BIV ICD UPGRADE  12/25/2012   MDT CRTD upgrade by Dr Lovena Le for ischemic cardiomyopathy and worsening conduction system disease  . CARDIAC CATHETERIZATION  06/2003,  01/2004  . CARDIAC CATHETERIZATION N/A 01/13/2016   Procedure: Left Heart Cath and Coronary Angiography;  Surgeon: Jettie Booze,  MD;  Location: [place] CV LAB;  Service: Cardiovascular;  Laterality: N/A;  . CARDIAC DEFIBRILLATOR PLACEMENT  05/24/2004   Implantation of a MDT single-chamber defibrillator  . CAROTID STENT  09/11/2003   Percutaneous transluminal angioplasty and stent placement of the left internal carotid artery.  Marland Kitchen CATARACT EXTRACTION W/ INTRAOCULAR LENS  IMPLANT, BILATERAL Bilateral ~ 2010  . CORONARY ANGIOPLASTY WITH STENT PLACEMENT  1990   "2" (12/25/2012)  . CYSTOSCOPY    . DIRECT LARYNGOSCOPY N/A  08/15/2019   Procedure: MICRODIRECT LARYNGOSCOPY WITH BIOPSY;  Surgeon: Melida Quitter, MD;  Location: [place];  Service: ENT;  Laterality: N/A;  . INSERT / REPLACE / REMOVE PACEMAKER    . LEAD REVISION N/A 12/25/2012   Procedure: LEAD REVISION;  Surgeon: Evans Lance, MD;  Location: [facility] CATH LAB;  Service: Cardiovascular;  Laterality: N/A;  . RIGID ESOPHAGOSCOPY N/A 08/15/2019   Procedure: RIGID ESOPHAGOSCOPY;  Surgeon: Melida Quitter, MD;  Location: [facility] OR;  Service: ENT;  Laterality: N/A;     reports that he quit smoking about 51 years ago. His smoking use included cigarettes. He has never used smokeless tobacco. He reports that he does not drink alcohol or use drugs.  No Known Allergies  Family History  Problem Relation Age of Onset  . Diabetes Mother   . Healthy Father   . Diabetes Brother   . Heart attack Neg Hx   . Stroke Neg Hx      Prior to Admission medications   Medication Sig Start Date End Date Taking? Authorizing Provider  atorvastatin (LIPITOR) 40 MG tablet TAKE 1 TABLET(40 MG) BY MOUTH DAILY AT 6 PM Patient taking differently: Take 40 mg by mouth daily at 6 PM.  03/03/19  Yes Axel Filler, MD  bisacodyl (DULCOLAX) 5 MG EC tablet Take 2 tablets (10 mg total) by mouth daily. 07/31/19 07/30/20 Yes Georgette Shell, MD  carvedilol (COREG) 6.25 MG tablet Take 1 tablet (6.25 mg total) by mouth 2 (two) times daily with a meal. 07/31/19  Yes Georgette Shell, MD  fluorometholone (FML) 0.1 % ophthalmic suspension Place 2 drops into both eyes 2 (two) times daily.   Yes [provider]  furosemide (LASIX) 40 MG tablet Take 1 tablet (40 mg total) by mouth 2 (two) times daily. 04/14/19  Yes Evans Lance, MD  insulin glargine (LANTUS) 100 UNIT/ML injection Inject 0.07 mLs (7 Units total) into the skin daily. 05/27/19  Yes Madalyn Rob, MD  lidocaine (XYLOCAINE) 2 % solution Patient: Mix 1part 2% viscous lidocaine, 1part H20. Swallow 61m of diluted mixture, 358m  before meals and at bedtime, up to QID 10/27/19  Yes SqEppie GibsonMD  ofloxacin (OCUFLOX) 0.3 % ophthalmic solution 1 drop 4 (four) times daily. 11/06/19  Yes [provider]  pantoprazole (PROTONIX) 40 MG tablet Take 1 tablet (40 mg total) by mouth daily. 10/07/19  Yes ViAxel FillerMD  polyethylene glycol (MIRALAX / GLYCOLAX) 17 g packet Take 17 g by mouth daily. 07/31/19  Yes MaGeorgette ShellMD  senna-docusate (SENOKOT-S) 8.6-50 MG tablet Take 2 tablets by mouth daily. Patient taking differently: Take 2 tablets by mouth daily as needed for mild constipation.  05/04/16  Yes PaKonrad FelixPA  tamsulosin (FLOMAX) 0.4 MG CAPS capsule Take 0.8 mg by mouth at bedtime.    Yes [provider]  VENTOLIN HFA 108 (90 Base) MCG/ACT inhaler INHALE 2 PUFFS INTO THE LUNGS EVERY 6 HOURS AS NEEDED FOR WHEEZING OR  SHORTNESS OF BREATH Patient taking differently: Inhale 2 puffs into the lungs every 6 (six) hours as needed for wheezing or shortness of breath.  03/15/16  Yes Axel Filler, MD  alum & mag hydroxide-simeth (MAALOX/MYLANTA) 200-200-20 MG/5ML suspension Take 15 mLs by mouth every 6 (six) hours as needed for indigestion or heartburn. Patient not taking: Reported on 11/20/2019 03/27/19   Wieters, Madelynn Done C, PA-C  Blood Glucose Monitoring Suppl (ACCU-CHEK AVIVA PLUS) w/Device KIT Check finger stick glucose once daily 05/06/18   Axel Filler, MD  glucose blood (ACCU-CHEK AVIVA PLUS) test strip Check blood sugar up to 3 times a day 09/15/19   Axel Filler, MD  Hydrocortisone, Perianal, (PROCTO-PAK) 1 % CREA Apply 1 application topically 2 (two) times daily. Patient not taking: Reported on 11/20/2019 07/25/19   Raylene Everts, MD  Insulin Pen Needle (B-D ULTRAFINE III SHORT PEN) 31G X 8 MM MISC USE ONCE DAILY WITH LANTUS PEN 05/06/18   Axel Filler, MD  lidocaine (XYLOCAINE) 2 % jelly Apply 1 application topically as needed. Patient not  taking: Reported on 11/20/2019 07/25/19   Raylene Everts, MD    Physical Exam: Vitals:   11/20/19 1228 11/20/19 1238 11/20/19 1531  BP: 114/72  140/70  Pulse: (!) 102  83  Resp: 18  18  Temp: 99 F (37.2 C)    TempSrc: Oral    SpO2: 96%  99%  Weight:  64.2 kg     Constitutional: NAD, calm, comfortable Vitals:   11/20/19 1228 11/20/19 1238 11/20/19 1531  BP: 114/72  140/70  Pulse: (!) 102  83  Resp: 18  18  Temp: 99 F (37.2 C)    TempSrc: Oral    SpO2: 96%  99%  Weight:  64.2 kg    Eyes: PERRL, lids and conjunctivae normal ENMT: Mucous membranes are moist. Posterior pharynx clear of any exudate or lesions.Normal dentition.  Neck: normal, supple, no masses, no thyromegaly Respiratory: clear to auscultation bilaterally, no wheezing, no crackles. Normal respiratory effort. No accessory muscle use.  Cardiovascular: Regular rate and rhythm, no murmurs / rubs / gallops. No extremity edema. 2+ pedal pulses. No carotid bruits.  Abdomen: no tenderness, no masses palpated. No hepatosplenomegaly. Bowel sounds positive.  Musculoskeletal: no clubbing / cyanosis. No joint deformity upper and lower extremities. Good ROM, no contractures. Normal muscle tone.  Skin: no rashes, lesions, ulcers. No induration Neurologic: CN 2-12 grossly intact. Sensation intact, DTR normal. Strength 5/5 in all 4.  Psychiatric: Normal judgment and insight. Alert and oriented x 3. Normal mood.     Labs on Admission: I have personally reviewed following labs and imaging studies  CBC: Recent Labs  Lab 11/20/19 1252  WBC 7.3  HGB 11.7*  HCT 38.8*  MCV 97.0  PLT 263   Basic Metabolic Panel: Recent Labs  Lab 11/20/19 1252  NA 144  K 4.6  CL 103  CO2 28  GLUCOSE 203*  BUN 37*  CREATININE 2.33*  CALCIUM 9.9   GFR: Estimated Creatinine Clearance: 20.7 mL/min (A) (by C-G formula based on SCr of 2.33 mg/dL (H)). Liver Function Tests: No results for input(s): AST, ALT, ALKPHOS, BILITOT, PROT,  ALBUMIN in the last 168 hours. Recent Labs  Lab 11/20/19 1252  LIPASE 26   No results for input(s): AMMONIA in the last 168 hours. Coagulation Profile: No results for input(s): INR, PROTIME in the last 168 hours. Cardiac Enzymes: No results for input(s): CKTOTAL, CKMB, CKMBINDEX, TROPONINI in the last  168 hours. BNP (last 3 results) No results for input(s): PROBNP in the last 8760 hours. HbA1C: No results for input(s): HGBA1C in the last 72 hours. CBG: Recent Labs  Lab 11/20/19 1506  GLUCAP 152*   Lipid Profile: No results for input(s): CHOL, HDL, LDLCALC, TRIG, CHOLHDL, LDLDIRECT in the last 72 hours. Thyroid Function Tests: No results for input(s): TSH, T4TOTAL, FREET4, T3FREE, THYROIDAB in the last 72 hours. Anemia Panel: No results for input(s): VITAMINB12, FOLATE, FERRITIN, TIBC, IRON, RETICCTPCT in the last 72 hours. Urine analysis:    Component Value Date/Time   COLORURINE YELLOW 07/28/2019 0229   APPEARANCEUR CLEAR 07/28/2019 0229   LABSPEC 1.012 07/28/2019 0229   PHURINE 5.0 07/28/2019 0229   GLUCOSEU NEGATIVE 07/28/2019 0229   GLUCOSEU 500 (A) 05/12/2009 2130   HGBUR NEGATIVE 07/28/2019 0229   BILIRUBINUR NEGATIVE 07/28/2019 0229   KETONESUR 5 (A) 07/28/2019 0229   PROTEINUR NEGATIVE 07/28/2019 0229   UROBILINOGEN 1.0 01/16/2015 1630   NITRITE NEGATIVE 07/28/2019 0229   LEUKOCYTESUR NEGATIVE 07/28/2019 0229    Radiological Exams on Admission: No results found.  EKG: Independently reviewed.   Assessment/Plan Active Problems:   Failure to thrive in adult   Failure to thrive/severe protein calorie malnutrition Consult nutrition, calorie count. Speech evaluation, Discussed with patient family including son and sister. According to patient family, as they were told by Dr. Isidore Moos from radiation oncology, patient swallow function will not recover in few weeks. If so, PEG tube should be considered. I went to discuss with on-call radiation oncology Dr.  Lisbeth Renshaw, who reasoned that since patient has completed the whole 20 days of radiation therapy, likely local inflammation, swelling will gradually improve, recommend conservative approach to monitor patient with optimized diet, speech evaluation, if no signs of measurable improvement, will consider feeding tube.  I then discussed the plan with patient son [name] over the phone, who understood and agreed with the plan. IV fluids with banana bag, full liquid diet with Ensure supplement, speech evaluation. Trial of Marinol  Hx of chronic systolic CHF  Hypovolemic from dehydration, hold Lasix for today IVF with banana bag  IDDM Sliding scale for now  [name] stage IV Hypovolemic, IV hydration for today, will re-evaluate tomorrow No significant electrolyte imbalance or acidosis  HTN Continue Coreg    DVT prophylaxis: Heparin subcu Code Status: Full code Family Communication: Son and patient's sister Disposition Plan: Probably will need at least 2 to 3 days to monitor patient's oral intake, and decision on nutrition plan. Consults called: Radiation oncology Dr. Lisbeth Renshaw Admission status: MedSurg   Lequita Halt MD [facility] Pager [phone]    11/20/2019, 4:54 PM

## 2019-11-20 NOTE — ED Triage Notes (Addendum)
Pt in from home w/emesis x 3 this am. Has Larynx CA, finished radiation yesterday. Feels weaker today, and has lost 9lbs in 3 wks. Denies any cp, abd pain or sob

## 2019-11-20 NOTE — Plan of Care (Signed)
  Problem: Education: Goal: Knowledge of General Education information will improve Description Including pain rating scale, medication(s)/side effects and non-pharmacologic comfort measures Outcome: Progressing   

## 2019-11-20 NOTE — ED Notes (Signed)
Dinner Tray Ordered @ 1710. 

## 2019-11-20 NOTE — Progress Notes (Signed)
Pt transferred from ED to 60M Pt oriented to room & call bell system. A/O x4 VS & BG checked Admission assessment mostly completed. Pt did have a few sips of water w/ meds. No coughing noted but pt took his time. Pt denied any pain or SOB. A/Ox4 voice is very hoarse  All questions & concerns addressed. Pt belongings at [place] clothes.

## 2019-11-21 DIAGNOSIS — Z923 Personal history of irradiation: Secondary | ICD-10-CM

## 2019-11-21 DIAGNOSIS — I5020 Unspecified systolic (congestive) heart failure: Secondary | ICD-10-CM

## 2019-11-21 DIAGNOSIS — Z794 Long term (current) use of insulin: Secondary | ICD-10-CM

## 2019-11-21 DIAGNOSIS — N183 Chronic kidney disease, stage 3 unspecified: Secondary | ICD-10-CM

## 2019-11-21 DIAGNOSIS — N179 Acute kidney failure, unspecified: Secondary | ICD-10-CM

## 2019-11-21 DIAGNOSIS — E46 Unspecified protein-calorie malnutrition: Secondary | ICD-10-CM

## 2019-11-21 DIAGNOSIS — E860 Dehydration: Secondary | ICD-10-CM

## 2019-11-21 DIAGNOSIS — R490 Dysphonia: Secondary | ICD-10-CM

## 2019-11-21 DIAGNOSIS — C320 Malignant neoplasm of glottis: Principal | ICD-10-CM

## 2019-11-21 DIAGNOSIS — E785 Hyperlipidemia, unspecified: Secondary | ICD-10-CM

## 2019-11-21 DIAGNOSIS — R1313 Dysphagia, pharyngeal phase: Secondary | ICD-10-CM

## 2019-11-21 DIAGNOSIS — E1122 Type 2 diabetes mellitus with diabetic chronic kidney disease: Secondary | ICD-10-CM

## 2019-11-21 DIAGNOSIS — I129 Hypertensive chronic kidney disease with stage 1 through stage 4 chronic kidney disease, or unspecified chronic kidney disease: Secondary | ICD-10-CM

## 2019-11-21 DIAGNOSIS — R1310 Dysphagia, unspecified: Secondary | ICD-10-CM | POA: Insufficient documentation

## 2019-11-21 DIAGNOSIS — R112 Nausea with vomiting, unspecified: Secondary | ICD-10-CM

## 2019-11-21 DIAGNOSIS — Z95810 Presence of automatic (implantable) cardiac defibrillator: Secondary | ICD-10-CM

## 2019-11-21 DIAGNOSIS — Z79899 Other long term (current) drug therapy: Secondary | ICD-10-CM

## 2019-11-21 LAB — COMPREHENSIVE METABOLIC PANEL
ALT: 13 U/L (ref 0–44)
AST: 14 U/L — ABNORMAL LOW (ref 15–41)
Albumin: 2.9 g/dL — ABNORMAL LOW (ref 3.5–5.0)
Alkaline Phosphatase: 65 U/L (ref 38–126)
Anion gap: 10 (ref 5–15)
BUN: 35 mg/dL — ABNORMAL HIGH (ref 8–23)
CO2: 28 mmol/L (ref 22–32)
Calcium: 9.4 mg/dL (ref 8.9–10.3)
Chloride: 105 mmol/L (ref 98–111)
Creatinine, Ser: 2.09 mg/dL — ABNORMAL HIGH (ref 0.61–1.24)
GFR calc Af Amer: 32 mL/min — ABNORMAL LOW (ref >60)
GFR calc non Af Amer: 28 mL/min — ABNORMAL LOW (ref >60)
Glucose, Bld: 182 mg/dL — ABNORMAL HIGH (ref 70–99)
Potassium: 4.6 mmol/L (ref 3.5–5.1)
Sodium: 143 mmol/L (ref 135–145)
Total Bilirubin: 0.6 mg/dL (ref 0.3–1.2)
Total Protein: 6.6 g/dL (ref 6.5–8.1)

## 2019-11-21 LAB — GLUCOSE, CAPILLARY
Glucose-Capillary: 115 mg/dL — ABNORMAL HIGH (ref 70–99)
Glucose-Capillary: 165 mg/dL — ABNORMAL HIGH (ref 70–99)
Glucose-Capillary: 167 mg/dL — ABNORMAL HIGH (ref 70–99)
Glucose-Capillary: 217 mg/dL — ABNORMAL HIGH (ref 70–99)

## 2019-11-21 LAB — PROTIME-INR
INR: 1.3 — ABNORMAL HIGH (ref 0.8–1.2)
Prothrombin Time: 15.6 seconds — ABNORMAL HIGH (ref 11.4–15.2)

## 2019-11-21 MED ORDER — THIAMINE HCL 100 MG/ML IJ SOLN
INTRAVENOUS | Status: AC
  Filled 2019-11-21: qty 1000

## 2019-11-21 MED ORDER — RESOURCE THICKENUP CLEAR PO POWD
ORAL | Status: DC | PRN
  Filled 2019-11-21: qty 125

## 2019-11-21 NOTE — Plan of Care (Signed)
  Problem: Activity: Goal: Risk for activity intolerance will decrease Outcome: Progressing   

## 2019-11-21 NOTE — Progress Notes (Signed)
Subjective: The patient stated that he has been unable to keep food down or swallow well for the past 4 days. He is able to swallow liquids such as Ensure but these will typically be regurgitated shortly after ingestion. He is able to keep small amounts of water down at times. He denied abdominal pain or bloating but does not recall the last time he passed gas or had a bowl movement.   Admission HPI: [name] is a 86 yo M w/ a PMHx notable for laryngeal cancer s/p radiation (most recent sessions 11/19/19), HTN, HLD, DMII, CHF s/p ICD, who presented to the ED for weight loss and dysphagia. He reported a severe sore throat on admission, muffled voice prompting the majority of his admission intake history to be obtained from his son Duval [phone]). The patient reportedly has begun experiencing increasing dysphagia and odynophagia since beginning his radiation therapy on January 26th of this year. As a results he has lost ~10 lbs suffering notable decreased PO intake. He has been unable to tolerate even a soft/liquid diet of Ensure or milk. On presentation he was noted to have an increased sCr from baseline, mild tachycardia, normal Hgb, and generally weak.   Objective:  Vital signs in last 24 hours: Vitals:   11/20/19 2011 11/20/19 2042 11/21/19 0450 11/21/19 0843  BP:  (!) 117/59 111/68 (!) 121/55  Pulse:  73 72 71  Resp:  14 16   Temp:  98.6 F (37 C) 98 F (36.7 C) 98.1 F (36.7 C)  TempSrc:  Oral Oral Oral  SpO2:  100% 100% 99%  Weight:  53.2 kg    Height: 5\' 11"  (1.803 m)      General: A/O x4, in no acute distress, afebrile, nondiaphoretic, cachectic in appearance  HEENT: PEERL, EMO intact Cardio: RRR, no mrg's  Pulmonary: CTA bilaterally, no wheezing or crackles  Abdomen: Bowel sounds normal, soft, nontender  MSK: BLE nontender, nonedematous Skin: Mucus membranes dry, there is decreased skin turgor of the lower extremities bilaterally and evidence of chronic venous  statis but no edema Psych: Appropriate affect, not depressed in appearance, engages well  Assessment/Plan:  Principal Problem:   Malignant neoplasm of glottis (HCC) Active Problems:   Type 2 diabetes mellitus with peripheral vascular disease (HCC)   Chronic systolic congestive heart failure (HCC)   [name] (chronic kidney disease) stage 3, GFR 30-59 ml/min (HCC)   Weight loss   Pharyngeal dysphagia  Assessment: [name] is a 86 yo M w/ a PMHx notable for laryngeal cancer s/p radiation (most recent sessions 11/19/19), HTN, HLD, DMII, CHF s/p ICD, who presented to the ED for weight loss and dysphagia. This was felt to be due to his recent initiation of radiation therapy for pharyngeal carcinoma. On-call radiation oncology Dr. Lisbeth Renshaw was consulted on admission who agreed that it was reasonable to monitor the patient on a progressive diet prior to considering a feeding tube as the inflammation is likely to improve given the recent completion of therapy. His nausea/vomiting are likely related as well.   Plan: Malignant neoplasm of glottis: Malnutrition: Pharyngeal dysphagia: Nausea/vomiting: Patient presented with weight loss, inability to tolerate PO intake due to pain, nausea and vomiting. We appreciate SLP assistance with our patient and will f/up their evaluation. I have reached out to Dr. Alessandra Bevels with [facility] (scoped patient in 2010) to discuss care as this is an unusual situation. There is a combination of ENT/GI related potential issues and I am uncertain if a NGT  is safe to pass in this patient should one be needed. As the [facility] team is only available Tuesdays and Fridays we would need to consider alternatives were one to be be placed today.  Plan: -F/up SLP eval -F/up GI recs as they agreed to see patient today -Continue full liquid diet -F/up dietician recs -Continue IV fluids until PO intake improves -Consider NGT if unable to tolerate PO intake by 2/27 in sufficient  amounts  DMII: Hgb A1c 7.7% and consistent with prior. CBG's stable Plan: -Continue holding home long acting insulin due to NPO status -Okay with SSI for now  [name] III: sCr increased from baseline on admission to 2.33. Likely 2/2 to decreased PO intake while on diuretics for CHF. We will hold the diuretics again today and continue gently rehydration until he is able to tolerate sufficient PO intake.  Plan: -4ml/hr mixed NS -Hold lasix again today -CMP this morning  HFrEF: Last Echo in 2017 with LVEF of 30-35% with multiple areas of akinesis. Appeared dehydrated on presentation. We will continue his coreg.  Diet: Full liquids as tolerate Code: Full DVT PPX: Enoxaparin Fluids: gentle while NPO Dispo: Anticipated discharge in approximately 2 day(s).   Kathi Ludwig, MD 11/21/2019, 9:15 AM

## 2019-11-21 NOTE — Progress Notes (Signed)
Initial Nutrition Assessment  DOCUMENTATION CODES:   Underweight, Severe malnutrition in context of chronic illness  INTERVENTION:   Plan EGD tomorrow per GI (unable to place Cortrak today). Given severity of malnutrition, recommend placement of feeding tube s/p EGD along with PO intake.    Magic cup TID with meals, each supplement provides 290 kcal and 9 grams of protein  Snacks TID  NUTRITION DIAGNOSIS:   Severe Malnutrition related to chronic illness, cancer and cancer related treatments as evidenced by severe fat depletion, severe muscle depletion, energy intake < or equal to 75% for > or equal to 1 month, percent weight loss.  GOAL:   Patient will meet greater than or equal to 90% of their needs  MONITOR:   PO intake, Supplement acceptance, Weight trends, Diet advancement, Labs, I & O's  REASON FOR ASSESSMENT:   Consult Assessment of nutrition requirement/status  ASSESSMENT:   Patient with PMH significant for laryngeal cancer s/p radiation (last treatment 2/25), HTN, HLD, DM, CHF s/p ICD, and PVD. Presents this admission with failure to thrive.   Pt endorses decreased intake since November after R vocal cord surgery. States swallowing difficulty prevented him from keeping food down. Typically consumed 4 meals daily that consisted of pinto beans and potato salad. Tried to drink Ensure but states it would "come right back up." Per cancer center RD on 2/19, son purees food for him but he has been refusing to eat it. RD and SLP observed pt eating jello at bedside. Had difficulty keeping it down. Diet changed to DYS1 with honey thick liquids. Discussed case with GI. Plan for EGD tomorrow so Cortrak unable to be placed today. Pt would likely benefit from long term feeding tube regardless of results as he is severely malnourished and it seems his appetite has been poor for months.   Pt endorses a UBW of 150 lb, the last time being at that weight in November. Records indicate pt  weighed 57.4 kg on 12/21 and 53.2 kg this admission (7.3% in two months, significant for time frame).   I/O: +2,286 ml since admit  UOP: 100 ml x 24 hrs   Drips: thiamine, folic acid, MVI Medications: marinol BID, SS novolog, miralax Labs: CBG 115-184  NUTRITION - FOCUSED PHYSICAL EXAM:    Most Recent Value  Orbital Region  Severe depletion  Upper Arm Region  Severe depletion  Thoracic and Lumbar Region  Severe depletion  Buccal Region  Severe depletion  Temple Region  Severe depletion  Clavicle Bone Region  Severe depletion  Clavicle and Acromion Bone Region  Severe depletion  Scapular Bone Region  Severe depletion  Dorsal Hand  Severe depletion  Patellar Region  Severe depletion  Anterior Thigh Region  Severe depletion  Posterior Calf Region  Severe depletion  Edema (RD Assessment)  None  Hair  Reviewed  Eyes  Reviewed  Mouth  Reviewed  Skin  Reviewed  Nails  Reviewed     Diet Order:   Diet Order            Diet NPO time specified  Diet effective midnight        DIET - DYS 1 Room service appropriate? Yes with Assist; Fluid consistency: Honey Thick  Diet effective now              EDUCATION NEEDS:   Education needs have been addressed  Skin:  Skin Assessment: Reviewed RN Assessment  Last BM:  2/25  Height:   Ht Readings from Last 1  Encounters:  11/20/19 5\' 11"  (1.803 m)    Weight:   Wt Readings from Last 1 Encounters:  11/20/19 53.2 kg    Ideal Body Weight:  78.2 kg  BMI:  Body mass index is 16.36 kg/m.  Estimated Nutritional Needs:   Kcal:  1600-1800 kcal  Protein:  80-95 grams  Fluid:  >/= 1.6 L/day   Mariana Single RD, LDN Clinical Nutrition Pager listed in [place]

## 2019-11-21 NOTE — Evaluation (Signed)
Clinical/Bedside Swallow Evaluation Patient Details  Name: [name] MRN: [id number] Date of Birth: 06/07/1931  Today's Date: 11/21/2019 Time: SLP Start Time (ACUTE ONLY): 1120 SLP Stop Time (ACUTE ONLY): 1150 SLP Time Calculation (min) (ACUTE ONLY): 30 min  Past Medical History:  Past Medical History:  Diagnosis Date  . Adenomatous colon polyp 02/14/2012  . AICD (automatic cardioverter/defibrillator) present    Medtronic   . BBB (bundle branch block)    right  . Carotid stenosis    a. s/p L carotid stent 2004;  b. Carotid US (09/2014): Bilateral ICA 1-39%, left ECA >59%, normal subclavian bilaterally, occluded left vertebral >> FU 2 years  . Chronic kidney disease   . Chronic systolic CHF (congestive heart failure) (HCC)    a. ischemic CM EF 15-20%;  b. s/p AICD 05/24/04;  c. Echo 7/06: EF 30-40%, mild reduced RVSF, d. Echo 12/2015 EF 35-40%  . Elevated PSA   . History of kidney stones    x 1  . HTN (hypertension)   . Hyperlipidemia   . ICD (implantable cardiac defibrillator) in place 12-25-2012   MDT CRTD upgrade by Dr Lovena Le  . Myocardial infarction ([place]) 1990  . Pericardial effusion    Echocardiogram (09/2014): EF 25% with distal anterior, distal inferior, distal lateral and apical akinesis, grade 1 diastolic dysfunction, very mild aortic stenosis (mean 7 mmHg) - this may be depressed due to low EF (2-D images suggest mild to moderate aortic stenosis), large pericardial effusion, no RA collapse  . Pneumonia   . Presence of permanent cardiac pacemaker    Medtronic  . PVD (peripheral vascular disease) ([place])    s/p L carotid PTCA/stent 2004  . Transient ischemic attack   . Type II diabetes mellitus ([place])    type II   Past Surgical History:  Past Surgical History:  Procedure Laterality Date  . BI-VENTRICULAR IMPLANTABLE CARDIOVERTER DEFIBRILLATOR UPGRADE N/A 12/25/2012   Procedure: BI-VENTRICULAR IMPLANTABLE CARDIOVERTER DEFIBRILLATOR UPGRADE;  Surgeon: Evans Lance, MD;   Location: [facility] CATH LAB;  Service: Cardiovascular;  Laterality: N/A;  . BIV ICD UPGRADE  12/25/2012   MDT CRTD upgrade by Dr Lovena Le for ischemic cardiomyopathy and worsening conduction system disease  . CARDIAC CATHETERIZATION  06/2003,  01/2004  . CARDIAC CATHETERIZATION N/A 01/13/2016   Procedure: Left Heart Cath and Coronary Angiography;  Surgeon: Jettie Booze, MD;  Location: [place] CV LAB;  Service: Cardiovascular;  Laterality: N/A;  . CARDIAC DEFIBRILLATOR PLACEMENT  05/24/2004   Implantation of a MDT single-chamber defibrillator  . CAROTID STENT  09/11/2003   Percutaneous transluminal angioplasty and stent placement of the left internal carotid artery.  Marland Kitchen CATARACT EXTRACTION W/ INTRAOCULAR LENS  IMPLANT, BILATERAL Bilateral ~ 2010  . CORONARY ANGIOPLASTY WITH STENT PLACEMENT  1990   "2" (12/25/2012)  . CYSTOSCOPY    . DIRECT LARYNGOSCOPY N/A 08/15/2019   Procedure: MICRODIRECT LARYNGOSCOPY WITH BIOPSY;  Surgeon: Melida Quitter, MD;  Location: [place];  Service: ENT;  Laterality: N/A;  . INSERT / REPLACE / REMOVE PACEMAKER    . LEAD REVISION N/A 12/25/2012   Procedure: LEAD REVISION;  Surgeon: Evans Lance, MD;  Location[facility] CATH LAB;  Service: Cardiovascular;  Laterality: N/A;  . RIGID ESOPHAGOSCOPY N/A 08/15/2019   Procedure: RIGID ESOPHAGOSCOPY;  Surgeon: Melida Quitter, MD;  Location: [name];  Service: ENT;  Laterality: N/A;   HPI:  86yo male admitted 11/20/19 with FTT, weight loss and difficulty swallowing. Severe throat pain. PMH: laryngeal cancer s/p 20 radiation  tx (last tx 11/19/19), HTN, HLD, DM2, CHF. 06/2019 laryngoscopy  = irregular right vocal fold  and right hemilarynx with decreased movement. 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 He reports 5/10 sore throat, s/p 20 radiation treatments. Generalized oral weakness  noted. Pt reports he vomits every time he eats/drinks. Pt accepted trials of thin, nectar, and honey thick liquids, and pureed textures. Pt exhibited wet voice and cough response after thin liquids, and regurgitation of thin and nectar thick liquid trials. Honey thick liquids and puree consistencies were tolerated without overt s/s aspiration or regurgitation. Pt commented on how much better those consistencies went down and stayed down.   Recommend puree diet with honey thick liquids, crushed meds. Also recommend repeat MBS and consideration of regular barium swallow to evaluate esophageal motility. Consulted with MD, who requests waiting on MBS until next week to allow the weekend for post-radiation swelling to lessen. Safe swallow precautions posted at [facility]. Consulted with RD as well - pt may benefit from nonoral feeding method to facilitate safe, adequate PO intake, given late effects of radiation.    SLP Visit Diagnosis: Dysphagia, unspecified (R13.10)    Aspiration Risk  Moderate aspiration risk    Diet Recommendation Dysphagia 1 (Puree);Honey-thick liquid   Liquid Administration via: Cup;Spoon Medication Administration: Crushed with puree Supervision: Patient able to self feed Compensations: Slow rate;Small sips/bites Postural Changes: Seated upright at 90 degrees;Remain upright for at least 30 minutes after po intake    Other  Recommendations Recommended Consults: Consider esophageal assessment Oral Care Recommendations: Oral care BID Other Recommendations: Order thickener from pharmacy;Have oral suction available   Follow up Recommendations Outpatient SLP      Frequency and Duration min 2x/week  1 week;2 weeks       Prognosis Prognosis for Safe Diet Advancement: Fair Barriers/Prognosis Comment: laryngeal cancer and radiation treatment      Swallow Study   General Date of Onset: 11/20/19 HPI: 86yo male admitted 11/20/19 with FTT, weight loss and difficulty swallowing.  Severe throat pain. PMH: laryngeal cancer s/p 20 radiation tx (last tx 11/19/19), HTN, HLD, DM2, CHF. 06/2019 laryngoscopy  = irregular right vocal fold  and right hemilarynx with decreased movement. 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 Pt was encouraged to complete HEP 2x/day until 6 months after last radiation tx (which would be 05/18/20), then 2x/week after that. MBS 04/2019 - significant oropharyngeal dysphagia, aspiration of thin liquids, v/p residue. Rec Dys1//NTL, chin tuck. Diet Prior to this Study: Other (Comment)(full liquid) Temperature Spikes Noted: No Respiratory Status: Room air History of Recent Intubation: No Behavior/Cognition: Alert;Cooperative Oral Cavity Assessment: (tongue coated) Oral Care Completed by SLP: Yes Oral Cavity - Dentition: Dentures, not available;Edentulous Vision: Functional for self-feeding Self-Feeding Abilities: Able to feed self Patient Positioning: Upright in bed Baseline Vocal Quality: Hoarse Volitional Cough: Weak Volitional Swallow: Able to elicit    Oral/Motor/Sensory Function Overall Oral Motor/Sensory Function: Generalized oral weakness   Ice Chips Ice chips: Not tested   Thin Liquid Thin Liquid: Impaired Presentation: Straw;Cup Pharyngeal  Phase Impairments: Cough - Immediate Other Comments: regurgitation after the swallow    Nectar Thick Nectar Thick Liquid: Impaired Pharyngeal Phase  Impairments: Other (comments) Other Comments: vomiting after the swallow   Honey Thick Honey Thick Liquid: Within functional limits Presentation: Self fed;Spoon;Cup   Puree Puree: Within functional limits Presentation: Spoon;Self Fed   Solid     Solid: Not tested     Laykin Rainone B. Quentin Ore, [facility], [place] Speech Language  Pathologist Office: [phone] Pager: [phone]  Shonna Chock 11/21/2019,1:12 PM

## 2019-11-21 NOTE — Consult Note (Signed)
Referring Provider:  IMTS  Primary Care Physician:  Axel Filler, MD Primary Gastroenterologist:  Dr. Amedeo Plenty   Reason for Consultation: Dysphagia, poor oral intake  HPI: [name] is a 86 y.o. 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  GI is consulted for further evaluation.  Patient seen and examined at bedside.  Difficult to obtain history as patient is only able to speak limited sentences.  He has been having trouble swallowing since his surgery on the right vocal cord.  He has been getting worse since his radiation.  He has been losing weight but unsure how much.  Not able to eat or drink mostly any food or liquid.    Past Medical History:  Diagnosis Date  . Adenomatous colon polyp 02/14/2012  . AICD (automatic cardioverter/defibrillator) present    Medtronic   . BBB (bundle branch block)    right  . Carotid stenosis    a. s/p L carotid stent 2004;  b. Carotid US (09/2014): Bilateral ICA 1-39%, left ECA >59%, normal subclavian bilaterally, occluded left vertebral >> FU 2 years  . Chronic kidney disease   . Chronic systolic CHF (congestive heart failure) (HCC)    a. ischemic CM EF 15-20%;  b. s/p AICD 05/24/04;  c. Echo 7/06: EF 30-40%, mild reduced RVSF, d. Echo 12/2015 EF 35-40%  . Elevated PSA   . History of kidney stones    x 1  . HTN (hypertension)   . Hyperlipidemia   . ICD (implantable cardiac defibrillator) in place 12-25-2012   MDT CRTD upgrade by Dr Lovena Le  . Myocardial infarction ([place]) 1990  . Pericardial effusion    Echocardiogram (09/2014): EF 25% with distal anterior, distal inferior, distal lateral and apical akinesis, grade 1 diastolic dysfunction, very mild aortic  stenosis (mean 7 mmHg) - this may be depressed due to low EF (2-D images suggest mild to moderate aortic stenosis), large pericardial effusion, no RA collapse  . Pneumonia   . Presence of permanent cardiac pacemaker    Medtronic  . PVD (peripheral vascular disease) ([place])    s/p L carotid PTCA/stent 2004  . Transient ischemic attack   . Type II diabetes mellitus ([place])    type II    Past Surgical History:  Procedure Laterality Date  . BI-VENTRICULAR IMPLANTABLE CARDIOVERTER DEFIBRILLATOR UPGRADE N/A 12/25/2012   Procedure: BI-VENTRICULAR IMPLANTABLE CARDIOVERTER DEFIBRILLATOR UPGRADE;  Surgeon: Evans Lance, MD;  Location: [facility] CATH LAB;  Service: Cardiovascular;  Laterality: N/A;  . BIV ICD UPGRADE  12/25/2012   MDT CRTD upgrade by Dr Lovena Le for ischemic cardiomyopathy and worsening conduction system disease  . CARDIAC CATHETERIZATION  06/2003,  01/2004  . CARDIAC CATHETERIZATION N/A 01/13/2016   Procedure: Left Heart Cath and Coronary Angiography;  Surgeon: Jettie Booze, MD;  Location: [place] CV LAB;  Service: Cardiovascular;  Laterality: N/A;  . CARDIAC DEFIBRILLATOR PLACEMENT  05/24/2004   Implantation of a MDT single-chamber defibrillator  . CAROTID STENT  09/11/2003   Percutaneous transluminal angioplasty and stent placement of the left internal carotid artery.  Marland Kitchen CATARACT EXTRACTION W/ INTRAOCULAR LENS  IMPLANT, BILATERAL Bilateral ~ 2010  . CORONARY ANGIOPLASTY WITH STENT PLACEMENT  1990   "2" (12/25/2012)  . CYSTOSCOPY    . DIRECT LARYNGOSCOPY  N/A 08/15/2019   Procedure: MICRODIRECT LARYNGOSCOPY WITH BIOPSY;  Surgeon: Melida Quitter, MD;  Location: [place];  Service: ENT;  Laterality: N/A;  . INSERT / REPLACE / REMOVE PACEMAKER    . LEAD REVISION N/A 12/25/2012   Procedure: LEAD REVISION;  Surgeon: Evans Lance, MD;  Location: [facility] CATH LAB;  Service: Cardiovascular;  Laterality: N/A;  . RIGID ESOPHAGOSCOPY N/A 08/15/2019   Procedure: RIGID ESOPHAGOSCOPY;  Surgeon: Melida Quitter, MD;  Location: [place];  Service: ENT;  Laterality: N/A;    Prior to Admission medications   Medication Sig Start Date End Date Taking? Authorizing Provider  atorvastatin (LIPITOR) 40 MG tablet TAKE 1 TABLET(40 MG) BY MOUTH DAILY AT 6 PM Patient taking differently: Take 40 mg by mouth daily at 6 PM.  03/03/19  Yes Axel Filler, MD  bisacodyl (DULCOLAX) 5 MG EC tablet Take 2 tablets (10 mg total) by mouth daily. 07/31/19 07/30/20 Yes Georgette Shell, MD  carvedilol (COREG) 6.25 MG tablet Take 1 tablet (6.25 mg total) by mouth 2 (two) times daily with a meal. 07/31/19  Yes Georgette Shell, MD  fluorometholone (FML) 0.1 % ophthalmic suspension Place 2 drops into both eyes 2 (two) times daily.   Yes [provider]  furosemide (LASIX) 40 MG tablet Take 1 tablet (40 mg total) by mouth 2 (two) times daily. 04/14/19  Yes Evans Lance, MD  insulin glargine (LANTUS) 100 UNIT/ML injection Inject 0.07 mLs (7 Units total) into the skin daily. 05/27/19  Yes Madalyn Rob, MD  lidocaine (XYLOCAINE) 2 % solution Patient: Mix 1part 2% viscous lidocaine, 1part H20. Swallow 34m of diluted mixture, 378m before meals and at bedtime, up to QID 10/27/19  Yes SqEppie GibsonMD  ofloxacin (OCUFLOX) 0.3 % ophthalmic solution 1 drop 4 (four) times daily. 11/06/19  Yes [provider]  pantoprazole (PROTONIX) 40 MG tablet Take 1 tablet (40 mg total) by mouth daily. 10/07/19  Yes ViAxel FillerMD  polyethylene glycol (MIRALAX / GLYCOLAX) 17 g packet Take 17 g by mouth daily. 07/31/19  Yes MaGeorgette ShellMD  senna-docusate (SENOKOT-S) 8.6-50 MG tablet Take 2 tablets by mouth daily. Patient taking differently: Take 2 tablets by mouth daily as needed for mild constipation.  05/04/16  Yes PaKonrad FelixPA  tamsulosin (FLOMAX) 0.4 MG CAPS capsule Take 0.8 mg by mouth at bedtime.    Yes [provider]  VENTOLIN HFA 108 (90 Base) MCG/ACT inhaler INHALE 2 PUFFS INTO  THE LUNGS EVERY 6 HOURS AS NEEDED FOR WHEEZING OR SHORTNESS OF BREATH Patient taking differently: Inhale 2 puffs into the lungs every 6 (six) hours as needed for wheezing or shortness of breath.  03/15/16  Yes ViAxel FillerMD  alum & mag hydroxide-simeth (MAALOX/MYLANTA) 200-200-20 MG/5ML suspension Take 15 mLs by mouth every 6 (six) hours as needed for indigestion or heartburn. Patient not taking: Reported on 11/20/2019 03/27/19   Wieters, HaMadelynn Done, PA-C  Blood Glucose Monitoring Suppl (ACCU-CHEK AVIVA PLUS) w/Device KIT Check finger stick glucose once daily 05/06/18   ViAxel FillerMD  glucose blood (ACCU-CHEK AVIVA PLUS) test strip Check blood sugar up to 3 times a day 09/15/19   ViAxel FillerMD  Hydrocortisone, Perianal, (PROCTO-PAK) 1 % CREA Apply 1 application topically 2 (two) times daily. Patient not taking: Reported on 11/20/2019 07/25/19   NeRaylene EvertsMD  Insulin Pen Needle (B-D ULTRAFINE III SHORT PEN) 31G X 8 MM MISC USE ONCE  DAILY WITH LANTUS PEN 05/06/18   Axel Filler, MD  lidocaine (XYLOCAINE) 2 % jelly Apply 1 application topically as needed. Patient not taking: Reported on 11/20/2019 07/25/19   Raylene Everts, MD    Scheduled Meds: . atorvastatin  40 mg Oral q1800  . carvedilol  6.25 mg Oral BID WC  . dronabinol  2.5 mg Oral BID AC  . feeding supplement (ENSURE ENLIVE)  237 mL Oral BID BM  . fluorometholone  2 drop Both Eyes BID  . heparin  5,000 Units Subcutaneous Q12H  . insulin aspart  0-9 Units Subcutaneous TID WC  . ofloxacin  1 drop Both Eyes QID  . pantoprazole  40 mg Oral Daily  . polyethylene glycol  17 g Oral Daily  . sodium chloride flush  3 mL Intravenous Once  . tamsulosin  0.8 mg Oral QHS   Continuous Infusions: . banana bag IV 1000 mL 50 mL/hr at 11/21/19 1015   PRN Meds:.acetaminophen **OR** acetaminophen, albuterol, lidocaine, oxyCODONE, senna-docusate  Allergies as of 11/20/2019  . (No Known Allergies)     Family History  Problem Relation Age of Onset  . Diabetes Mother   . Healthy Father   . Diabetes Brother   . Heart attack Neg Hx   . Stroke Neg Hx     Social History   Socioeconomic History  . Marital status: Widowed    Spouse name: Not on file  . Number of children: Not on file  . Years of education: 73  . Highest education level: Not on file  Occupational History  . Occupation: retired  Tobacco Use  . Smoking status: Former Smoker    Types: Cigarettes    Quit date: 12/27/1967    Years since quitting: 51.9  . Smokeless tobacco: Never Used  Substance and Sexual Activity  . Alcohol use: No    Alcohol/week: 0.0 standard drinks    Comment: 12/25/2012 "quit all alcohol 60 yr ago"  . Drug use: No  . Sexual activity: Not on file  Other Topics Concern  . Not on file  Social History Narrative   ** Merged History Encounter **       Single, 2 adult children, daughter in [place], son in [place] Strain:   . Difficulty of Paying Living Expenses: Not on file  Food Insecurity:   . Worried About Charity fundraiser in the Last Year: Not on file  . Ran Out of Food in the Last Year: Not on file  Transportation Needs:   . Lack of Transportation (Medical): Not on file  . Lack of Transportation (Non-Medical): Not on file  Physical Activity:   . Days of Exercise per Week: Not on file  . Minutes of Exercise per Session: Not on file  Stress:   . Feeling of Stress : Not on file  Social Connections:   . Frequency of Communication with Friends and Family: Not on file  . Frequency of Social Gatherings with Friends and Family: Not on file  . Attends Religious Services: Not on file  . Active Member of Clubs or Organizations: Not on file  . Attends Archivist Meetings: Not on file  . Marital Status: Not on file  Intimate Partner Violence:   . Fear of Current or Ex-Partner: Not on file  . Emotionally Abused: Not on  file  . Physically Abused: Not on file  . Sexually Abused: Not on file  Review of Systems:Review of Systems  Constitutional: Positive for malaise/fatigue and weight loss. Negative for chills and fever.  HENT: Negative for hearing loss and tinnitus.   Eyes: Negative for blurred vision and double vision.  Respiratory: Positive for cough and sputum production.   Cardiovascular: Negative for chest pain and palpitations.  Gastrointestinal: Positive for nausea. Negative for abdominal pain, blood in stool, constipation and diarrhea.  Genitourinary: Negative for dysuria and urgency.  Musculoskeletal: Negative for myalgias and neck pain.  Skin: Negative for itching and rash.  Neurological: Negative for seizures and loss of consciousness.  Endo/Heme/Allergies: Does not bruise/bleed easily.  Psychiatric/Behavioral: Negative for substance abuse. The patient is nervous/anxious.     Physical Exam: Vital signs: Vitals:   11/21/19 0450 11/21/19 0843  BP: 111/68 (!) 121/55  Pulse: 72 71  Resp: 16   Temp: 98 F (36.7 C) 98.1 F (36.7 C)  SpO2: 100% 99%   Last BM Date: 11/20/19 Physical Exam  Constitutional: He is oriented to person, place, and time.  Cachectic appearing patient  HENT:  Head: Normocephalic and atraumatic.  Eyes: EOM are normal.  Cardiovascular: Normal rate, regular rhythm and normal heart sounds.  Pulmonary/Chest: Effort normal. No respiratory distress.  Abdominal: Soft. Bowel sounds are normal. He exhibits no distension. There is no abdominal tenderness. There is no rebound and no guarding.  Musculoskeletal:        General: No edema. Normal range of motion.     Cervical back: Normal range of motion and neck supple.  Neurological: He is alert and oriented to person, place, and time.  Skin: Skin is dry. No erythema.  Psychiatric: He has a normal mood and affect. Thought content normal.    GI:  Lab Results: Recent Labs    11/20/19 1252  WBC 7.3  HGB 11.7*  HCT  38.8*  PLT 240   BMET Recent Labs    11/20/19 1252 11/21/19 1008  NA 144 143  K 4.6 4.6  CL 103 105  CO2 28 28  GLUCOSE 203* 182*  BUN 37* 35*  CREATININE 2.33* 2.09*  CALCIUM 9.9 9.4   LFT Recent Labs    11/21/19 1008  PROT 6.6  ALBUMIN 2.9*  AST 14*  ALT 13  ALKPHOS 65  BILITOT 0.6   PT/INR Recent Labs    11/21/19 1008  LABPROT 15.6*  INR 1.3*     Studies/Results: No results found.  Impression/Plan: -Poor oral intake in a patient with right vocal cord cancer s/p radiation and surgery.  Intermittent symptoms since November 2020. -Weight loss.  Secondary to poor oral intake  Recommendations ------------------------- -I do not think patient will be able to tolerate barium swallow as he is not able to drink even thin liquid. -Plan for EGD tomorrow.  Possible dilation discussed. -May need long-term solution for his nutrition such as IR guided PEG tube placement or surgical J-tube placement.  Risks (bleeding, infection, bowel perforation that could require surgery, sedation-related changes in cardiopulmonary systems), benefits (identification and possible treatment of source of symptoms, exclusion of certain causes of symptoms), and alternatives (watchful waiting, radiographic imaging studies, empiric medical treatment)  were explained to patient in detail and patient wishes to proceed.    LOS: 1 day   Otis Brace  MD, FACP 11/21/2019, 12:03 PM  Contact #  [phone]

## 2019-11-21 NOTE — Progress Notes (Signed)
Internal Medicine Attending:   I saw and examined the patient. I reviewed the resident's note and I agree with the resident's findings and plan as documented in the resident's note.  Patient states that he has persistent hoarseness of his voice as well as pain while swallowing.  He is unable to keep down liquids down and regurgitates them shortly after ingestion.  Patient has a history of laryngeal cancer status post radiation therapy with his last day of radiation being on therapy 24th.  Patient states that he has lost approximately 10 pounds secondary to his decreased oral intake.  I suspect that his painful swallowing and decreased oral intake related to inflammation from his radiation.  GI follow-up and recommendations appreciated.  Patient scheduled for an EGD tomorrow with possible dilation.  Patient will likely be unable to tolerate a barium swallow as he cannot swallow thin liquids.  If dilation does not work patient will likely need placement of G tube by IR to initiate tube feeds.  SLP follow-up and recommendations appreciated.  We will continue IV fluids for now.  No further work-up at this time.  Of note, patient does have AKI on [name] with creatinine of 2.3 on admission from a baseline of approximately 1.5-1.8.  Patient's creatinine is improved to 2 today.  We will continue to monitor closely.  Likely etiology of his AKI is dehydration secondary to decreased oral intake.

## 2019-11-22 ENCOUNTER — Inpatient Hospital Stay (HOSPITAL_COMMUNITY): Payer: Medicare HMO | Admitting: Anesthesiology

## 2019-11-22 ENCOUNTER — Other Ambulatory Visit: Payer: Self-pay

## 2019-11-22 ENCOUNTER — Encounter (HOSPITAL_COMMUNITY): Payer: Self-pay | Admitting: Internal Medicine

## 2019-11-22 ENCOUNTER — Encounter (HOSPITAL_COMMUNITY)
Admission: EM | Disposition: A | Discharge status: Home Health | Payer: Self-pay | Source: Home / Self Care | Attending: Internal Medicine

## 2019-11-22 DIAGNOSIS — E43 Unspecified severe protein-calorie malnutrition: Secondary | ICD-10-CM | POA: Diagnosis present

## 2019-11-22 HISTORY — PX: ESOPHAGOGASTRODUODENOSCOPY (EGD) WITH PROPOFOL: SHX5813

## 2019-11-22 LAB — BASIC METABOLIC PANEL
Anion gap: 9 (ref 5–15)
BUN: 30 mg/dL — ABNORMAL HIGH (ref 8–23)
CO2: 28 mmol/L (ref 22–32)
Calcium: 9.6 mg/dL (ref 8.9–10.3)
Chloride: 109 mmol/L (ref 98–111)
Creatinine, Ser: 1.83 mg/dL — ABNORMAL HIGH (ref 0.61–1.24)
GFR calc Af Amer: 38 mL/min — ABNORMAL LOW (ref >60)
GFR calc non Af Amer: 33 mL/min — ABNORMAL LOW (ref >60)
Glucose, Bld: 160 mg/dL — ABNORMAL HIGH (ref 70–99)
Potassium: 4.4 mmol/L (ref 3.5–5.1)
Sodium: 146 mmol/L — ABNORMAL HIGH (ref 135–145)

## 2019-11-22 LAB — CBC
HCT: 45.6 % (ref 39.0–52.0)
Hemoglobin: 13.9 g/dL (ref 13.0–17.0)
MCH: 29.5 pg (ref 26.0–34.0)
MCHC: 30.5 g/dL (ref 30.0–36.0)
MCV: 96.8 fL (ref 80.0–100.0)
Platelets: 244 10*3/uL (ref 150–400)
RBC: 4.71 MIL/uL (ref 4.22–5.81)
RDW: 13.7 % (ref 11.5–15.5)
WBC: 8.1 10*3/uL (ref 4.0–10.5)
nRBC: 0 % (ref 0.0–0.2)

## 2019-11-22 LAB — GLUCOSE, CAPILLARY
Glucose-Capillary: 137 mg/dL — ABNORMAL HIGH (ref 70–99)
Glucose-Capillary: 130 mg/dL — ABNORMAL HIGH (ref 70–99)
Glucose-Capillary: 243 mg/dL — ABNORMAL HIGH (ref 70–99)

## 2019-11-22 SURGERY — ESOPHAGOGASTRODUODENOSCOPY (EGD) WITH PROPOFOL
Anesthesia: Monitor Anesthesia Care

## 2019-11-22 MED ORDER — THIAMINE HCL 100 MG/ML IJ SOLN
INTRAVENOUS | Status: AC
  Filled 2019-11-22 (×3): qty 1000

## 2019-11-22 MED ORDER — SODIUM CHLORIDE 0.9 % IV SOLN: INTRAVENOUS | Status: DC

## 2019-11-22 MED ORDER — PROPOFOL 10 MG/ML IV BOLUS
INTRAVENOUS | Status: DC | PRN
  Administered 2019-11-22: 20 mg via INTRAVENOUS

## 2019-11-22 MED ORDER — EPHEDRINE SULFATE 50 MG/ML IJ SOLN
INTRAMUSCULAR | Status: DC | PRN
  Administered 2019-11-22: 10 mg via INTRAVENOUS

## 2019-11-22 MED ORDER — PHENYLEPHRINE 40 MCG/ML (10ML) SYRINGE FOR IV PUSH (FOR BLOOD PRESSURE SUPPORT)
PREFILLED_SYRINGE | INTRAVENOUS | Status: DC | PRN
  Administered 2019-11-22: 80 ug via INTRAVENOUS
  Administered 2019-11-22 (×2): 120 ug via INTRAVENOUS

## 2019-11-22 MED ORDER — PROPOFOL 500 MG/50ML IV EMUL
INTRAVENOUS | Status: DC | PRN
  Administered 2019-11-22: 100 ug/kg/min via INTRAVENOUS

## 2019-11-22 SURGICAL SUPPLY — 15 items

## 2019-11-22 NOTE — Transfer of Care (Signed)
Immediate Anesthesia Transfer of Care Note  Patient: [name]  Procedure(s) Performed: ESOPHAGOGASTRODUODENOSCOPY (EGD) WITH PROPOFOL with possible dilation (N/A )  Patient Location: Endoscopy Unit  Anesthesia Type:MAC  Level of Consciousness: sedated  Airway & Oxygen Therapy: Patient Spontanous Breathing and Patient connected to nasal cannula oxygen  Post-op Assessment: Report given to RN and Post -op Vital signs reviewed and stable  Post vital signs: Reviewed and stable  Last Vitals:  Vitals Value Taken Time  BP 105/39 11/22/19 0947  Temp    Pulse 74 11/22/19 0947  Resp 16 11/22/19 0947  SpO2 100 % 11/22/19 0947    Last Pain:  Vitals:   11/22/19 0947  TempSrc: (P) Axillary  PainSc:          Complications: No apparent anesthesia complications

## 2019-11-22 NOTE — Progress Notes (Addendum)
Subjective: HD#2  Overnight, no acute events reported.  This morning, patient evaluated at bedside following EGD. He reports ongoing pain with swallowing. Otherwise, denies any acute concerns. He notes that his voice hoarseness is improved from prior.   Objective: Vital signs in last 24 hours: Vitals:   11/21/19 0843 11/21/19 1712 11/21/19 2057 11/22/19 0619  BP: (!) 121/55 133/76 127/64 127/78  Pulse: 71 78 66 70  Resp:  18 14 18   Temp: 98.1 F (36.7 C)  97.7 F (36.5 C) (!) 97.5 F (36.4 C)  TempSrc: Oral  Oral Oral  SpO2: 99% 98% 100% 100%  Weight:      Height:       CBC Latest Ref Rng & Units 11/20/2019 08/15/2019 07/31/2019  WBC 4.0 - 10.5 K/uL 7.3 9.6 8.1  Hemoglobin 13.0 - 17.0 g/dL 11.7(L) 11.5(L) 10.8(L)  Hematocrit 39.0 - 52.0 % 38.8(L) 36.7(L) 34.4(L)  Platelets 150 - 400 K/uL 240 273 215   BMP Latest Ref Rng & Units 11/21/2019 11/20/2019 11/10/2019  Glucose 70 - 99 mg/dL 182(H) 203(H) 193(H)  BUN 8 - 23 mg/dL 35(H) 37(H) 33(H)  Creatinine 0.61 - 1.24 mg/dL 2.09(H) 2.33(H) 2.09(H)  BUN/Creat Ratio 10 - 24 - - -  Sodium 135 - 145 mmol/L 143 144 145  Potassium 3.5 - 5.1 mmol/L 4.6 4.6 3.9  Chloride 98 - 111 mmol/L 105 103 103  CO2 22 - 32 mmol/L 28 28 33(H)  Calcium 8.9 - 10.3 mg/dL 9.4 9.9 10.1   Physical Exam  Constitutional: He is oriented to person, place, and time. Vital signs are normal. He appears malnourished. He appears not lethargic and to not be writhing in pain. He appears unhealthy. No distress.  Cardiovascular: Normal rate, regular rhythm, normal heart sounds and intact distal pulses.  No murmur heard. Pulmonary/Chest: Effort normal and breath sounds normal. No respiratory distress. He has no wheezes. He has no rales.  Abdominal: Soft. Bowel sounds are normal. He exhibits no distension. There is no abdominal tenderness. There is no rebound.  Musculoskeletal:        General: No tenderness or edema. Normal range of motion.  Neurological: He is alert  and oriented to person, place, and time. He appears not lethargic. No cranial nerve deficit.  Skin: Skin is warm and dry. He is not diaphoretic.    EGD 11/22/2019:  - The nasopharynx and oropharynx are abnormal. His swallowing issues are all stemming from his oropharynx and not his esophagus - Normal esophagus. - Normal stomach. - Normal duodenal bulb, first portion of the duodenum and second portion of theduodenum. - No specimens collected.  Assessment/Plan: [name] is an 86 year old male with a PMHx of laryngeal cancer s/p radiation (most recent session 11/19/2019), HTN, HLD, DMII, CHF s/p ICD presenting with dysphagia, nausea/vomiting and weight loss.   Laryngeal cancer s/p radiation therapy: Malnutrition:  Pharyngeal dysphagia: Patient presented with inability to tolerate PO intake due to nausea/vomiting with subsequent weight loss. Patient followed by Dr. Isidore Moos. Patient evaluated by SLP and recommended for dysphagia 1 diet yesterday. Case discussed with GI yesterday and recommended for endoscopy today. EGD showed nasopharynx and oropharynx abnormalities and suspect that his swallowing issues are stemming from oropharynx as opposed to esophagus.No esophageal abnormalities noted. EGD findings discussed with Dr. Lisbeth Renshaw. Suspect that his symptoms are secondary to ongoing inflammation and recommended continued conservative management at this point; however, will likely need NG tube placement. Will touch base with ENT given EGD findings.  -  ENT consulted, appreciate their recommendations - Continued SLP evaluation - Continue NS+ thiamine, folic acid and multivitamins - Dronabinol 2.5mg  bid for nausea - Lidocaine 2% mouth solution q4h prn  - Possible NG tube placement tomorrow   DM II:  HbA1c 7.7. CBG 130's - CBG monitoring - SSI  AKI on CKDIII:  Patient noted to have elevated sCr of 2.33 in setting of decreased PO intake while on diuretics for CHF. Diuretics held and patient  given hydration with improvement in sCr 1.83. - Continue with IVF as stated above - BMP daily   HFrEF:  Hypertension:  LVEF 30-35% with multiple areas of akinesis noted on Echo in 2017. Patient has been on diuretics at home but appeared hypovolemic on presentation.  - Continue carvedilol 6.25mg  bid - Holding diuretic in setting of AKI and hypovolemia   BPH:  - Tamsulosin 0.8mg  qHS  FEN/GI:  Diet: Dysphagia 1 (puree) Fluids: NS+thiamine+folic acid+multivitamin 75cc/hr  Electrolytes: Monitor and replete prn Bowel regimen: Miralax + senokot  VTE Prophylaxis: Lovenox 40mg  daily Code status: FULL   Prior to Admission Living Arrangement: Home Anticipated Discharge Location: Pending PT/OT eval and recommendations  Barriers to Discharge: Continued medical management  Dispo: Anticipated discharge in approximately 2-3 day(s).   Harvie Heck, MD  Internal Medicine, PGY-1 Pager: [phone] 11/22/2019, 6:55 AM

## 2019-11-22 NOTE — Progress Notes (Signed)
[name] 8:15 AM  Subjective: Patient seen and examined in hospital computer chart reviewed and case discussed with my partner Dr. Alessandra Bevels and has been unable to swallow for a while but is not in any pain  Objective: Vital signs stable afebrile no acute distress exam please see preassessment evaluation labs reviewed x-rays reviewed  Assessment: Inability to swallow probably from his cancer or radiation  Plan: Okay to proceed with endoscopy as requested with anesthesia assistance  [facility]  office [phone] After 5PM or if no answer call [phone]

## 2019-11-22 NOTE — Op Note (Signed)
[facility] Patient Name: [name] Procedure Date : 11/22/2019 MRN: [id number] Attending MD: Clarene Essex , MD Date of Birth: August 24, 1931 CSN: [id number] Age: 86 Admit Type: Inpatient Procedure:                Upper GI endoscopy Indications:              Dysphagia Providers:                Clarene Essex, MD, Kary Kos, RN, Laverda Sorenson,                            Technician, Clearnce Sorrel, CRNA Referring MD:              Medicines:                Propofol total dose 629 mg IV Complications:            No immediate complications. Estimated Blood Loss:     Estimated blood loss: none. Procedure:                Pre-Anesthesia Assessment:                           - Prior to the procedure, a History and Physical                            was performed, and patient medications and                            allergies were reviewed. The patient's tolerance of                            previous anesthesia was also reviewed. The risks                            and benefits of the procedure and the sedation                            options and risks were discussed with the patient.                            All questions were answered, and informed consent                            was obtained. Prior Anticoagulants: The patient has                            taken no previous anticoagulant or antiplatelet                            agents. ASA Grade Assessment: III - A patient with                            severe systemic disease. After reviewing the risks  and benefits, the patient was deemed in                            satisfactory condition to undergo the procedure.                           After obtaining informed consent, the endoscope was                            passed under direct vision. Throughout the                            procedure, the patient's blood pressure, pulse, and                            oxygen saturations  were monitored continuously. The                            Endoscope was introduced through the mouth, and                            advanced to the second part of duodenum. The upper                            GI endoscopy was technically difficult and complex                            due to abnormal anatomy and we used the ultraslim                            scope which once the anatomical landmarks were                            confirmed we were able to easily do the endoscopy.                            The patient tolerated the procedure well. Scope In: Scope Out: Findings:      The nasopharynx and oropharynx are abnormal with inflammation white       plaques. And possible residual mass      The examined esophagus was normal. Including upper esophageal sphincter      The entire examined stomach was normal.      The duodenal bulb, first portion of the duodenum and second portion of       the duodenum were normal. Impression:               - The nasopharynx and oropharynx are abnormal. His                            swallowing issues are all stemming from his                            oropharynx and not his esophagus                           -  Normal esophagus.                           - Normal stomach.                           - Normal duodenal bulb, first portion of the                            duodenum and second portion of the duodenum.                           - No specimens collected. Recommendation:           - NPO. Consider NG tube feeds versus PEG                           - Continue present medications.                           - Return to GI clinic PRN. Please let me know if I                            can be of any further assistance during this                            hospital stay                           - Telephone GI clinic if symptomatic PRN.                           - Refer to an ENT specialist today and will allow                             hospital team to call referral. Procedure Code(s):        --- Professional ---                           [phone], Esophagogastroduodenoscopy, flexible,                            transoral; diagnostic, including collection of                            specimen(s) by brushing or washing, when performed                            (separate procedure) Diagnosis Code(s):        --- Professional ---                           J39.2, Other diseases of pharynx                           R13.10, Dysphagia, unspecified CPT copyright 2019 American Medical Association. All rights reserved. The  codes documented in this report are preliminary and upon coder review may  be revised to meet current compliance requirements. Clarene Essex, MD 11/22/2019 10:03:33 AM This report has been signed electronically. Number of Addenda: 0

## 2019-11-22 NOTE — Progress Notes (Signed)
Patient back from Endo, VS normal no complaint of any pain at this time. Patient dangle on the side of the bed. Will continue to monitor patient.

## 2019-11-22 NOTE — Consult Note (Signed)
Reason for Consult: Dysphagia, laryngeal cancer Referring Physician: Internal Medicine  [name] is an 86 y.o. male.  HPI: 86 year old male known to me with right vocal fold cancer who recently completed radiation treatments.  He has had worsening difficulty swallowing.  He says he can get the bolus down but it comes back up.  He has lost considerable weight.  GI evaluation included an upper endoscopy that suggested white plaque in the nasopharynx and oropharynx.  Esophagus looked normal.  Consult was requested.  He has not been seen by radiation oncology.  Past Medical History:  Diagnosis Date  . Adenomatous colon polyp 02/14/2012  . AICD (automatic cardioverter/defibrillator) present    Medtronic   . BBB (bundle branch block)    right  . Carotid stenosis    a. s/p L carotid stent 2004;  b. Carotid US (09/2014): Bilateral ICA 1-39%, left ECA >59%, normal subclavian bilaterally, occluded left vertebral >> FU 2 years  . Chronic kidney disease   . Chronic systolic CHF (congestive heart failure) (HCC)    a. ischemic CM EF 15-20%;  b. s/p AICD 05/24/04;  c. Echo 7/06: EF 30-40%, mild reduced RVSF, d. Echo 12/2015 EF 35-40%  . Elevated PSA   . History of kidney stones    x 1  . HTN (hypertension)   . Hyperlipidemia   . ICD (implantable cardiac defibrillator) in place 12-25-2012   MDT CRTD upgrade by Dr Lovena Le  . Myocardial infarction ([place]) 1990  . Pericardial effusion    Echocardiogram (09/2014): EF 25% with distal anterior, distal inferior, distal lateral and apical akinesis, grade 1 diastolic dysfunction, very mild aortic stenosis (mean 7 mmHg) - this may be depressed due to low EF (2-D images suggest mild to moderate aortic stenosis), large pericardial effusion, no RA collapse  . Pneumonia   . Presence of permanent cardiac pacemaker    Medtronic  . PVD (peripheral vascular disease) ([place])    s/p L carotid PTCA/stent 2004  . Transient ischemic attack   . Type II diabetes mellitus  ([place])    type II    Past Surgical History:  Procedure Laterality Date  . BI-VENTRICULAR IMPLANTABLE CARDIOVERTER DEFIBRILLATOR UPGRADE N/A 12/25/2012   Procedure: BI-VENTRICULAR IMPLANTABLE CARDIOVERTER DEFIBRILLATOR UPGRADE;  Surgeon: Evans Lance, MD;  Location: [facility] CATH LAB;  Service: Cardiovascular;  Laterality: N/A;  . BIV ICD UPGRADE  12/25/2012   MDT CRTD upgrade by Dr Lovena Le for ischemic cardiomyopathy and worsening conduction system disease  . CARDIAC CATHETERIZATION  06/2003,  01/2004  . CARDIAC CATHETERIZATION N/A 01/13/2016   Procedure: Left Heart Cath and Coronary Angiography;  Surgeon: Jettie Booze, MD;  Location: [place] CV LAB;  Service: Cardiovascular;  Laterality: N/A;  . CARDIAC DEFIBRILLATOR PLACEMENT  05/24/2004   Implantation of a MDT single-chamber defibrillator  . CAROTID STENT  09/11/2003   Percutaneous transluminal angioplasty and stent placement of the left internal carotid artery.  Marland Kitchen CATARACT EXTRACTION W/ INTRAOCULAR LENS  IMPLANT, BILATERAL Bilateral ~ 2010  . CORONARY ANGIOPLASTY WITH STENT PLACEMENT  1990   "2" (12/25/2012)  . CYSTOSCOPY    . DIRECT LARYNGOSCOPY N/A 08/15/2019   Procedure: MICRODIRECT LARYNGOSCOPY WITH BIOPSY;  Surgeon: Melida Quitter, MD;  Location: [facility];  Service: ENT;  Laterality: N/A;  . INSERT / REPLACE / REMOVE PACEMAKER    . LEAD REVISION N/A 12/25/2012   Procedure: LEAD REVISION;  Surgeon: Evans Lance, MD;  Location: [facility] C[facility] LAB;  Service: Cardiovascular;  Laterality: N/A;  .  RIGID ESOPHAGOSCOPY N/A 08/15/2019   Procedure: RIGID ESOPHAGOSCOPY;  Surgeon: Melida Quitter, MD;  Location[facility] OR;  Service: ENT;  Laterality: N/A;    Family History  Problem Relation Age of Onset  . Diabetes Mother   . Healthy Father   . Diabetes Brother   . Heart attack Neg Hx   . Stroke Neg Hx     Social History:  reports that he quit smoking about 51 years ago. His smoking use included cigarettes. He has never used smokeless tobacco. He  reports that he does not drink alcohol or use drugs.  Allergies: No Known Allergies  Medications: I have reviewed the patient's current medications.  Results for orders placed or performed during the hospital encounter of 11/20/19 (from the past 48 hour(s))  CBG monitoring, ED     Status: Abnormal   Collection Time: 11/20/19  3:06 PM  Result Value Ref Range   Glucose-Capillary 152 (H) 70 - 99 mg/dL    Comment: Glucose reference range applies only to samples taken after fasting for at least 8 hours.  SARS CORONAVIRUS 2 (TAT 6-24 HRS) Nasopharyngeal Nasopharyngeal Swab     Status: None   Collection Time: 11/20/19  3:32 PM   Specimen: Nasopharyngeal Swab  Result Value Ref Range   SARS Coronavirus 2 NEGATIVE NEGATIVE    Comment: (NOTE) SARS-CoV-2 target nucleic acids are NOT DETECTED. The SARS-CoV-2 RNA is generally detectable in upper and lower respiratory specimens during the acute phase of infection. Negative results do not preclude SARS-CoV-2 infection, do not rule out co-infections with other pathogens, and should not be used as the sole basis for treatment or other patient management decisions. Negative results must be combined with clinical observations, patient history, and epidemiological information. The expected result is Negative. Fact Sheet for Patients: SugarRoll.be Fact Sheet for Healthcare Providers: https://www.woods-mathews.com/ This test is not yet approved or cleared by the [place] FDA and  has been authorized for detection and/or diagnosis of SARS-CoV-2 by FDA under an Emergency Use Authorization (EUA). This EUA will remain  in effect (meaning this test can be used) for the duration of the COVID-19 declaration under Section 56 4(b)(1) of the Act, 21 U.S.C. section 360bbb-3(b)(1), unless the authorization is terminated or revoked sooner. Performed at [facility], [place] [address], [place], [place] [address]    Urinalysis, Routine w reflex microscopic     Status: Abnormal   Collection Time: 11/20/19  5:30 PM  Result Value Ref Range   Color, Urine YELLOW YELLOW   APPearance CLOUDY (A) CLEAR   Specific Gravity, Urine 1.011 1.005 - 1.030   pH 7.0 5.0 - 8.0   Glucose, UA NEGATIVE NEGATIVE mg/dL   Hgb urine dipstick SMALL (A) NEGATIVE   Bilirubin Urine NEGATIVE NEGATIVE   Ketones, ur NEGATIVE NEGATIVE mg/dL   Protein, ur 100 (A) NEGATIVE mg/dL   Nitrite NEGATIVE NEGATIVE   Leukocytes,Ua MODERATE (A) NEGATIVE   RBC / HPF 11-20 0 - 5 RBC/hpf   WBC, UA >50 (H) 0 - 5 WBC/hpf   Bacteria, UA MANY (A) NONE SEEN   Squamous Epithelial / LPF 6-10 0 - 5   WBC Clumps PRESENT     Comment: Performed at [facility] [address] [place], [place] [address]  CBG monitoring, ED     Status: Abnormal   Collection Time: 11/20/19  5:38 PM  Result Value Ref Range   Glucose-Capillary 163 (H) 70 - 99 mg/dL    Comment: Glucose reference  range applies only to samples taken after fasting for at least 8 hours.  Glucose, capillary     Status: Abnormal   Collection Time: 11/20/19  6:27 PM  Result Value Ref Range   Glucose-Capillary 138 (H) 70 - 99 mg/dL    Comment: Glucose reference range applies only to samples taken after fasting for at least 8 hours.  Glucose, capillary     Status: Abnormal   Collection Time: 11/20/19  8:47 PM  Result Value Ref Range   Glucose-Capillary 184 (H) 70 - 99 mg/dL    Comment: Glucose reference range applies only to samples taken after fasting for at least 8 hours.  Glucose, capillary     Status: Abnormal   Collection Time: 11/21/19  6:55 AM  Result Value Ref Range   Glucose-Capillary 115 (H) 70 - 99 mg/dL    Comment: Glucose reference range applies only to samples taken after fasting for at least 8 hours.  Comprehensive metabolic panel     Status: Abnormal   Collection Time: 11/21/19 10:08 AM  Result Value Ref Range   Sodium 143 135 - 145 mmol/L   Potassium 4.6 3.5 - 5.1  mmol/L   Chloride 105 98 - 111 mmol/L   CO2 28 22 - 32 mmol/L   Glucose, Bld 182 (H) 70 - 99 mg/dL    Comment: Glucose reference range applies only to samples taken after fasting for at least 8 hours.   BUN 35 (H) 8 - 23 mg/dL   Creatinine, Ser 2.09 (H) 0.61 - 1.24 mg/dL   Calcium 9.4 8.9 - 10.3 mg/dL   Total Protein 6.6 6.5 - 8.1 g/dL   Albumin 2.9 (L) 3.5 - 5.0 g/dL   AST 14 (L) 15 - 41 U/L   ALT 13 0 - 44 U/L   Alkaline Phosphatase 65 38 - 126 U/L   Total Bilirubin 0.6 0.3 - 1.2 mg/dL   GFR calc non Af Amer 28 (L) >60 mL/min   GFR calc Af Amer 32 (L) >60 mL/min   Anion gap 10 5 - 15    Comment: Performed at [facility] [address] [place], [place] [address]  Protime-INR     Status: Abnormal   Collection Time: 11/21/19 10:08 AM  Result Value Ref Range   Prothrombin Time 15.6 (H) 11.4 - 15.2 seconds   INR 1.3 (H) 0.8 - 1.2    Comment: (NOTE) INR goal varies based on device and disease states. Performed at [facility], [place] [address], [place], [place] [address]   Glucose, capillary     Status: Abnormal   Collection Time: 11/21/19 12:03 PM  Result Value Ref Range   Glucose-Capillary 165 (H) 70 - 99 mg/dL    Comment: Glucose reference range applies only to samples taken after fasting for at least 8 hours.  Glucose, capillary     Status: Abnormal   Collection Time: 11/21/19  5:08 PM  Result Value Ref Range   Glucose-Capillary 167 (H) 70 - 99 mg/dL    Comment: Glucose reference range applies only to samples taken after fasting for at least 8 hours.  Glucose, capillary     Status: Abnormal   Collection Time: 11/21/19  9:00 PM  Result Value Ref Range   Glucose-Capillary 217 (H) 70 - 99 mg/dL    Comment: Glucose reference range applies only to samples taken after fasting for at least 8 hours.  CBC     Status: None   Collection Time:  11/22/19 10:32 AM  Result Value Ref Range   WBC 8.1 4.0 - 10.5 K/uL   RBC 4.71 4.22 - 5.81 MIL/uL   Hemoglobin 13.9 13.0 -  17.0 g/dL   HCT 45.6 39.0 - 52.0 %   MCV 96.8 80.0 - 100.0 fL   MCH 29.5 26.0 - 34.0 pg   MCHC 30.5 30.0 - 36.0 g/dL   RDW 13.7 11.5 - 15.5 %   Platelets 244 150 - 400 K/uL   nRBC 0.0 0.0 - 0.2 %    Comment: Performed at [facility], [place] [address], [place], [place] [address]  Basic metabolic panel     Status: Abnormal   Collection Time: 11/22/19 10:32 AM  Result Value Ref Range   Sodium 146 (H) 135 - 145 mmol/L   Potassium 4.4 3.5 - 5.1 mmol/L   Chloride 109 98 - 111 mmol/L   CO2 28 22 - 32 mmol/L   Glucose, Bld 160 (H) 70 - 99 mg/dL    Comment: Glucose reference range applies only to samples taken after fasting for at least 8 hours.   BUN 30 (H) 8 - 23 mg/dL   Creatinine, Ser 1.83 (H) 0.61 - 1.24 mg/dL   Calcium 9.6 8.9 - 10.3 mg/dL   GFR calc non Af Amer 33 (L) >60 mL/min   GFR calc Af Amer 38 (L) >60 mL/min   Anion gap 9 5 - 15    Comment: Performed at [facility] [address] [place], [place] [address]  Glucose, capillary     Status: Abnormal   Collection Time: 11/22/19 11:36 AM  Result Value Ref Range   Glucose-Capillary 137 (H) 70 - 99 mg/dL    Comment: Glucose reference range applies only to samples taken after fasting for at least 8 hours.    No results found.  Review of Systems  HENT: Positive for sore throat and trouble swallowing.   All other systems reviewed and are negative.  Blood pressure 128/68, pulse 77, temperature (!) 97.5 F (36.4 C), temperature source Oral, resp. rate 18, height 5\' 11"  (1.803 m), weight 53.2 kg, SpO2 99 %. Physical Exam  Constitutional: He is oriented to person, place, and time. He appears well-developed. No distress.  Cachectic  HENT:  Head: Normocephalic and atraumatic.  Right Ear: External ear normal.  Left Ear: External ear normal.  Nose: Nose normal.  Mouth/Throat: Oropharynx is clear and moist.  Markedly hoarse, nearly aphonic.  Eyes: Pupils are equal, round, and reactive to light. Conjunctivae and EOM  are normal.  Neck:  Thin neck  Cardiovascular: Normal rate.  Respiratory: Effort normal.  No stridor  Musculoskeletal:     Cervical back: Normal range of motion and neck supple.  Neurological: He is alert and oriented to person, place, and time. No cranial nerve deficit.  Skin: Skin is warm and dry.  Psychiatric: He has a normal mood and affect. His behavior is normal. Judgment and thought content normal.    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  See procedure note.  There is no white plaques or residual tumor, per se, but there is significant edema of the larynx, particularly posteriorly.  The right hemilarynx is immobile.  It appears that his dysphagia is a consequence of effects of radiation treatment and this will not likely improve soon.  He will need an alternative avenue for nutrition according to  Rad/Onc recommendations.  Passing an NG tube or Coretrack should be fine, if decided.  A PEG tube will likely be needed.  Melida Quitter 11/22/2019, 1:46 PM

## 2019-11-22 NOTE — Anesthesia Postprocedure Evaluation (Signed)
Anesthesia Post Note  Patient: [name]  Procedure(s) Performed: ESOPHAGOGASTRODUODENOSCOPY (EGD) WITH PROPOFOL with possible dilation (N/A )     Patient location during evaluation: PACU Anesthesia Type: MAC Level of consciousness: awake and alert Pain management: pain level controlled Vital Signs Assessment: post-procedure vital signs reviewed and stable Respiratory status: spontaneous breathing, nonlabored ventilation, respiratory function stable and patient connected to nasal cannula oxygen Cardiovascular status: stable and blood pressure returned to baseline Postop Assessment: no apparent nausea or vomiting Anesthetic complications: no    Last Vitals:  Vitals:   11/22/19 0947 11/22/19 0958  BP: (!) 105/39 (!) 135/53  Pulse: 74 79  Resp: 16 17  Temp: 36.6 C   SpO2: 100% 100%    Last Pain:  Vitals:   11/22/19 0958  TempSrc:   PainSc: 0-No pain                 Effie Berkshire

## 2019-11-22 NOTE — Plan of Care (Signed)
  Problem: Clinical Measurements: Goal: Ability to maintain clinical measurements within normal limits will improve Outcome: Progressing Goal: Will remain free from infection Outcome: Progressing Goal: Respiratory complications will improve Outcome: Progressing Goal: Cardiovascular complication will be avoided Outcome: Progressing   Problem: Coping: Goal: Level of anxiety will decrease Outcome: Progressing

## 2019-11-22 NOTE — Anesthesia Preprocedure Evaluation (Signed)
Anesthesia Evaluation  Patient identified by MRN, date of birth, ID band Patient awake    Reviewed: Allergy & Precautions, NPO status , Patient's Chart, lab work & pertinent test results  Airway Mallampati: I  TM Distance: >3 FB Neck ROM: Full    Dental  (+) Edentulous Upper, Edentulous Lower   Pulmonary COPD, former smoker,     + decreased breath sounds      Cardiovascular hypertension, + CAD, + Peripheral Vascular Disease and +CHF  + dysrhythmias + pacemaker + Cardiac Defibrillator  Rhythm:Regular Rate:Normal  LVEF: 20%   Neuro/Psych negative neurological ROS  negative psych ROS   GI/Hepatic GERD  Medicated,  Endo/Other  diabetes, Type 2, Insulin Dependent  Renal/GU CRFRenal disease     Musculoskeletal   Abdominal Normal abdominal exam  (+)   Peds  Hematology   Anesthesia Other Findings   Reproductive/Obstetrics                             Anesthesia Physical Anesthesia Plan  ASA: IV  Anesthesia Plan: MAC   Post-op Pain Management:    Induction: Intravenous  PONV Risk Score and Plan: 0  Airway Management Planned: Simple Face Mask and Nasal Cannula  Additional Equipment: None  Intra-op Plan:   Post-operative Plan:   Informed Consent: I have reviewed the patients History and Physical, chart, labs and discussed the procedure including the risks, benefits and alternatives for the proposed anesthesia with the patient or authorized representative who has indicated his/her understanding and acceptance.       Plan Discussed with: CRNA  Anesthesia Plan Comments:         Anesthesia Quick Evaluation

## 2019-11-22 NOTE — Procedures (Signed)
Preop diagnosis: Glottic cancer, pharyngeal dysphagia Postop diagnosis: same Procedure: Transnasal fiberoptic laryngoscopy Surgeon: Redmond Baseman Anesth: Topical with 4% lidocaine Compl: None Findings: No white plaques or residual tumor.  Right hemilarynx immobile.  Marked laryngeal edema, particularly posteriorly. Description:  After discussing risks, benefits, and alternatives, the patient was placed in a seated position and the right nasal passage was sprayed with topical anesthetic.  The fiberoptic scope was passed through the right nasal passage to view the pharynx and larynx.  Findings are noted above.  The scope was then removed and he was returned to nursing care in stable condition.

## 2019-11-23 DIAGNOSIS — N400 Enlarged prostate without lower urinary tract symptoms: Secondary | ICD-10-CM

## 2019-11-23 LAB — CBC
HCT: 37.8 % — ABNORMAL LOW (ref 39.0–52.0)
Hemoglobin: 11.5 g/dL — ABNORMAL LOW (ref 13.0–17.0)
MCH: 29.3 pg (ref 26.0–34.0)
MCHC: 30.4 g/dL (ref 30.0–36.0)
MCV: 96.4 fL (ref 80.0–100.0)
Platelets: 200 10*3/uL (ref 150–400)
RBC: 3.92 MIL/uL — ABNORMAL LOW (ref 4.22–5.81)
RDW: 13.9 % (ref 11.5–15.5)
WBC: 8.4 10*3/uL (ref 4.0–10.5)
nRBC: 0 % (ref 0.0–0.2)

## 2019-11-23 LAB — GLUCOSE, CAPILLARY
Glucose-Capillary: 172 mg/dL — ABNORMAL HIGH (ref 70–99)
Glucose-Capillary: 191 mg/dL — ABNORMAL HIGH (ref 70–99)
Glucose-Capillary: 145 mg/dL — ABNORMAL HIGH (ref 70–99)
Glucose-Capillary: 228 mg/dL — ABNORMAL HIGH (ref 70–99)

## 2019-11-23 LAB — BASIC METABOLIC PANEL
Anion gap: 7 (ref 5–15)
BUN: 27 mg/dL — ABNORMAL HIGH (ref 8–23)
CO2: 26 mmol/L (ref 22–32)
Calcium: 9.1 mg/dL (ref 8.9–10.3)
Chloride: 112 mmol/L — ABNORMAL HIGH (ref 98–111)
Creatinine, Ser: 1.65 mg/dL — ABNORMAL HIGH (ref 0.61–1.24)
GFR calc Af Amer: 43 mL/min — ABNORMAL LOW (ref >60)
GFR calc non Af Amer: 37 mL/min — ABNORMAL LOW (ref >60)
Glucose, Bld: 196 mg/dL — ABNORMAL HIGH (ref 70–99)
Potassium: 4.3 mmol/L (ref 3.5–5.1)
Sodium: 145 mmol/L (ref 135–145)

## 2019-11-23 MED ORDER — LACTATED RINGERS IV SOLN: INTRAVENOUS | Status: AC

## 2019-11-23 NOTE — Progress Notes (Signed)
Subjective: HD#3 Overnight, no acute events reported.  This morning, patient evaluated at bedside. He notes that he was able to eat a little this morning. His voice is improved from prior but still hoarse. He notes that he is cold but otherwise no acute concerns at this time. Discussed findings of the laryngoscopy with continued posterior laryngeal edema and may need G tube placement. Patient agrees to tube placement.   Objective: Vital signs in last 24 hours: Vitals:   11/22/19 1039 11/22/19 1633 11/22/19 2049 11/23/19 0541  BP: 128/68 (!) 132/55 122/88 126/80  Pulse: 77 66 74 70  Resp: 18 18 18 18   Temp: (!) 97.5 F (36.4 C) 97.7 F (36.5 C) 98.9 F (37.2 C) 98.9 F (37.2 C)  TempSrc: Oral Oral  Oral  SpO2: 99% 100% 100% 100%  Weight:   53 kg   Height:       CBC Latest Ref Rng & Units 11/22/2019 11/20/2019 08/15/2019  WBC 4.0 - 10.5 K/uL 8.1 7.3 9.6  Hemoglobin 13.0 - 17.0 g/dL 13.9 11.7(L) 11.5(L)  Hematocrit 39.0 - 52.0 % 45.6 38.8(L) 36.7(L)  Platelets 150 - 400 K/uL 244 240 273   BMP Latest Ref Rng & Units 11/22/2019 11/21/2019 11/20/2019  Glucose 70 - 99 mg/dL 160(H) 182(H) 203(H)  BUN 8 - 23 mg/dL 30(H) 35(H) 37(H)  Creatinine 0.61 - 1.24 mg/dL 1.83(H) 2.09(H) 2.33(H)  BUN/Creat Ratio 10 - 24 - - -  Sodium 135 - 145 mmol/L 146(H) 143 144  Potassium 3.5 - 5.1 mmol/L 4.4 4.6 4.6  Chloride 98 - 111 mmol/L 109 105 103  CO2 22 - 32 mmol/L 28 28 28   Calcium 8.9 - 10.3 mg/dL 9.6 9.4 9.9   Physical Exam  Constitutional: He is oriented to person, place, and time. Vital signs are normal. He appears malnourished. He appears not lethargic and to not be writhing in pain. He appears unhealthy. No distress.  Cardiovascular: Normal rate, regular rhythm, normal heart sounds and intact distal pulses.  No murmur heard. Pulmonary/Chest: Effort normal and breath sounds normal. No respiratory distress. He has no wheezes. He has no rales.  Abdominal: Soft. Bowel sounds are normal. He  exhibits no distension. There is no abdominal tenderness. There is no rebound.  Musculoskeletal:        General: No tenderness or edema. Normal range of motion.  Neurological: He is alert and oriented to person, place, and time. He appears not lethargic. No cranial nerve deficit.  Skin: Skin is warm and dry. He is not diaphoretic.   TRANSNASAL FIBEROPTIC LARYNGOSCOPY 11/22/2019:  Findings: No white plaques or residual tumor.  Right hemilarynx immobile.  Marked laryngeal edema, particularly posteriorly.  Assessment/Plan: [name] is an 86 year old male with a PMHx of laryngeal cancer s/p radiation (most recent session 11/19/2019), HTN, HLD, DMII, CHF s/p ICD presenting with dysphagia, nausea/vomiting and weight loss.   Laryngeal cancer s/p radiation therapy: Malnutrition:  Pharyngeal dysphagia: Patient presented with inability to tolerate PO intake due to nausea/vomiting with subsequent weight loss. Patient followed by Dr. Isidore Moos. Patient evaluated by SLP and recommended for dysphagia 1 diet. EGD findings without any esophageal abnormalities but possible oropharynx etiology of symptoms. ENT consulted and patient had transnasal laryngoscopy yesterday significant for right hemilarynx immobility and marked posterior laryngeal edema secondary to radiation. This morning, patient notes that he was able to eat a little bit of apple sauce at bedside. He notes some improvement in his hoarseness. Patient's symptoms are likely secondary to  ongoing laryngeal edema and will likely need a G tube placement until his edema improves for nutrition support. Will monitor PO intake today. If patient is able to tolerate >50% of meals, will hold off on the G tube placement. Discussed with patient who is agreeable with the plan.  - ENT consulted, appreciate their recommendations - Monitor PO intake today  - Continued SLP evaluation - Continue NS+ thiamine, folic acid and multivitamins - Dronabinol 2.5mg  bid for  nausea - Lidocaine 2% mouth solution q4h prn  - Possible G tube placement tomorrow   DM II:  HbA1c 7.7. CBG 170-190s.  - CBG monitoring - SSI  AKI on CKDIII:  Patient noted to have elevated sCr of 2.33 in setting of decreased PO intake while on diuretics for CHF on admission. Improved to 1.65 this AM in setting of IVF and holding diuretics.  - Continue with IVF as stated above - Continue to hold diuretics - BMP daily   HFrEF:  Hypertension:  LVEF 30-35% with multiple areas of akinesis noted on Echo in 2017. Patient has been on diuretics at home but appeared hypovolemic on presentation. Patient's diuretics held in setting of hypovolemia and AKI.   - Continue carvedilol 6.25mg  bid - Continue to hold diuretics  BPH:  - Tamsulosin 0.8mg  qHS  FEN/GI:  Diet: Dysphagia 1 (puree) Fluids: NS+thiamine+folic acid+multivitamin 75cc/hr  Electrolytes: Monitor and replete prn Bowel regimen: Miralax + senokot  VTE Prophylaxis: Lovenox 40mg  daily Code status: FULL   Prior to Admission Living Arrangement: Home Anticipated Discharge Location: Pending PT/OT eval and recommendations  Barriers to Discharge: Continued medical management  Dispo: Anticipated discharge in approximately 2-3 day(s).   Harvie Heck, MD  Internal Medicine, PGY-1 Pager: [phone] 11/23/2019, 6:42 AM

## 2019-11-23 NOTE — Progress Notes (Signed)
Internal Medicine Attending:   I saw and examined the patient. I reviewed the resident's note and I agree with the resident's findings and plan as documented in the resident's note.  Patient states he was able to eat a little bit this morning and that his voice is slowly improving.  Patient is initially mated to the hospital with dysphagia, nausea vomiting and weight loss in the setting of recent radiation therapy for laryngeal cancer.  ENT follow-up and recommendations appreciated.  Patient is status post transnasal laryngoscopy yesterday which showed right hemilarynx mobility as well as marked posterior laryngeal edema likely secondary to radiation therapy.  Will monitor patient's oral intake today but I suspect that he will likely need an NG tube or PEG tube placement for nutritional needs till this inflammation improves.  Patient also noted to have AKI on [name] stage III with creatinine of 2.33 on admission which has since improved to 1.65.  We will continue with IV fluids for now.  Hold diuretics at this time.  No further work-up at this time.  We will continue to monitor closely.

## 2019-11-23 NOTE — Progress Notes (Signed)
NAME:  [name], MRN:  [id number], DOB:  04-01-1931, LOS: 4 ADMISSION DATE:  11/20/2019   Subjective/Interm history  No overnight events VSS Ate about 50% breakfast. Endorses some pharyngeal pain, denies SOB.  Objective   Blood pressure 134/69, pulse 76, temperature 98.3 F (36.8 C), temperature source Oral, resp. rate 17, height 5\' 11"  (1.803 m), weight 53 kg, SpO2 98 %.     Intake/Output Summary (Last 24 hours) at 11/24/2019 1005 Last data filed at 11/24/2019 0853 Gross per 24 hour  Intake 2216.15 ml  Output 1000 ml  Net 1216.15 ml   Filed Weights   11/22/19 2049 11/23/19 2041 11/24/19 0500  Weight: 53 kg 53 kg 53 kg    Examination: GENERAL: chronically ill appearing.in no acute distress HEENT: thick white coating on tongue. Post radiation skin changes on neck. No apparent mouth sores. Airway patent. CARDIAC: heart RRR. No peripheral edema.  PULMONARY: acyanotic. Diffuse rhonchi ABDOMEN: soft. Nontender to palpation.  Nondistended.  NEURO: alert and oriented SKIN: no rash or lesions on limited exam  PSYCH: A/Ox3. Normal affect  Labs    CBC Latest Ref Rng & Units 11/24/2019 11/23/2019 11/22/2019  WBC 4.0 - 10.5 K/uL 10.2 8.4 8.1  Hemoglobin 13.0 - 17.0 g/dL 11.6(L) 11.5(L) 13.9  Hematocrit 39.0 - 52.0 % 38.0(L) 37.8(L) 45.6  Platelets 150 - 400 K/uL 146(L) 200 244   BMP Latest Ref Rng & Units 11/24/2019 11/23/2019 11/22/2019  Glucose 70 - 99 mg/dL 167(H) 196(H) 160(H)  BUN 8 - 23 mg/dL 22 27(H) 30(H)  Creatinine 0.61 - 1.24 mg/dL 1.57(H) 1.65(H) 1.83(H)  BUN/Creat Ratio 10 - 24 - - -  Sodium 135 - 145 mmol/L 144 145 146(H)  Potassium 3.5 - 5.1 mmol/L 4.6 4.3 4.4  Chloride 98 - 111 mmol/L 112(H) 112(H) 109  CO2 22 - 32 mmol/L 25 26 28   Calcium 8.9 - 10.3 mg/dL 8.9 9.1 9.6    Summary  86 yo male with recent history of laryngeal cancer s/p radiation treatment on 2/24 who presented with dysphagia, n/v, weight loss and found to have post radiation laryngeal  edema.  Assessment & Plan:  Principal Problem:   Malignant neoplasm of glottis (HCC) Active Problems:   Type 2 diabetes mellitus with peripheral vascular disease (HCC)   Chronic systolic congestive heart failure (HCC)   [name] (chronic kidney disease) stage 3, GFR 30-59 ml/min (HCC)   Weight loss   Pharyngeal dysphagia   Protein-calorie malnutrition, severe   Post radiation therapy laryngeal edema with dysphagia and severe protein calorie malnutrition Acute on chronic renal injury AKI improving Appreciate ENT's assistance.  Suspect acute on chronic renal injury attributable to poor po intake and should improve with improved intake and gentle IVF. Will need to tread carefully with IVF given his heart failure. Plan If he maintains adequate po intake, will hopefully be able to hold off on G tube.  Will try to reach out to rad onc to discuss steroid trial. Strict PO intake monitoring Continue lidocaine oral solution prn Dronabinol for nausea Will continue to hold diuretics for AKI and give gentle IVF. Continue protein supplementation  Decreasing platelet count. 240>200>146. Will need to monitor for continued drop. He is currently on prophylactic heparin and will need to consider HIT if there is continued drop. No signs of active bleeding or bruising. Will repeat cbc in am.  T2DM. Continue SSI. HFrEF (EF 30-35%). Continue to hold home dose diuretic for AKI. HTN. Continue carvedilol.   Best practice:  CODE STATUS: Full Diet: dysphagia 1 DVT for prophylaxis: heparin  Dispo: pending improved po intake vs g-tube placement  Mitzi Hansen, MD INTERNAL MEDICINE RESIDENT PGY-1 PAGER #: [phone] 11/24/19 10:05 AM

## 2019-11-23 NOTE — Evaluation (Signed)
Physical Therapy Evaluation Patient Details Name: [name] MRN: [id number] DOB: August 21, 1931 Today's Date: 11/23/2019   History of Present Illness  [name] is an 86 year old male with a PMHx of laryngeal cancer s/p radiation (most recent session 11/19/2019), HTN, HLD, DMII, CHF s/p ICD presenting with dysphagia, nausea/vomiting and weight loss. EGD findings without any esophageal abnormalities but possible oropharynx etiology of symptoms. ENT consulted and patient had transnasal laryngoscopy yesterday significant for right hemilarynx immobility and marked posterior laryngeal edema secondary to radiation.  Clinical Impression  Pt admitted with above diagnosis. Prior to admission, pt lives in alone in an apartment, uses a quad cane for ambulation and is independent with ADL's. Pt presents with generalized weakness, decreased endurance and impaired communication. Ambulating 200 feet with a walker at a min guard assist level. Recommend HHPT to maximize functional independence.      Follow Up Recommendations Home health PT (has walker)    Equipment Recommendations  None recommended by PT    Recommendations for Other Services       Precautions / Restrictions Precautions Precautions: Fall Restrictions Weight Bearing Restrictions: No      Mobility  Bed Mobility Overal bed mobility: Modified Independent                Transfers Overall transfer level: Needs assistance Equipment used: Rolling walker (2 wheeled) Transfers: Sit to/from Stand Sit to Stand: Supervision            Ambulation/Gait Ambulation/Gait assistance: Min guard Gait Distance (Feet): 200 Feet Assistive device: Rolling walker (2 wheeled) Gait Pattern/deviations: Step-through pattern;Decreased stride length;Narrow base of support Gait velocity: decreased   General Gait Details: Min guard for stability, decreased bilateral foot clearance  Stairs            Wheelchair Mobility     Modified Rankin (Stroke Patients Only)       Balance Overall balance assessment: Needs assistance Sitting-balance support: Feet supported Sitting balance-Leahy Scale: Good     Standing balance support: No upper extremity supported;During functional activity Standing balance-Leahy Scale: Fair                               Pertinent Vitals/Pain Pain Assessment: No/denies pain    Home Living Family/patient expects to be discharged to:: Private residence Living Arrangements: Alone Available Help at Discharge: Available PRN/intermittently;Family(sister, son) Type of Home: Apartment Home Access: Ramped entrance     Home Layout: One level Home Equipment: [place] - 4 wheels;Cane - quad Additional Comments: uses sock aide    Prior Function Level of Independence: Independent with assistive device(s)         Comments: Uses quad cane, has transportation to radiation appointments, independent ADL's/meals     Hand Dominance   Dominant Hand: Right    Extremity/Trunk Assessment   Upper Extremity Assessment Upper Extremity Assessment: Generalized weakness    Lower Extremity Assessment Lower Extremity Assessment: Generalized weakness       Communication   Communication: HOH;Expressive difficulties(hoarse)  Cognition Arousal/Alertness: Awake/alert Behavior During Therapy: WFL for tasks assessed/performed Overall Cognitive Status: Difficult to assess                                 General Comments: WFL for mobility      General Comments      Exercises     Assessment/Plan  PT Assessment Patient needs continued PT services  PT Problem List Decreased strength;Decreased mobility;Decreased activity tolerance;Decreased balance       PT Treatment Interventions DME instruction;Gait training;Functional mobility training;Therapeutic activities;Therapeutic exercise;Balance training;Patient/family education    PT Goals (Current goals can  be found in the Care Plan section)  Acute Rehab PT Goals Patient Stated Goal: did not state PT Goal Formulation: With patient Time For Goal Achievement: 12/07/19 Potential to Achieve Goals: Good    Frequency Min 3X/week   Barriers to discharge        Co-evaluation               AM-PAC PT "6 Clicks" Mobility  Outcome Measure Help needed turning from your back to your side while in a flat bed without using bedrails?: None Help needed moving from lying on your back to sitting on the side of a flat bed without using bedrails?: None Help needed moving to and from a bed to a chair (including a wheelchair)?: None Help needed standing up from a chair using your arms (e.g., wheelchair or bedside chair)?: None Help needed to walk in hospital room?: A Little Help needed climbing 3-5 steps with a railing? : A Little 6 Click Score: 22    End of Session Equipment Utilized During Treatment: Gait belt Activity Tolerance: Patient tolerated treatment well Patient left: in chair;with call bell/phone within reach;with chair alarm set Nurse Communication: Mobility status PT Visit Diagnosis: Unsteadiness on feet (R26.81);Muscle weakness (generalized) (M62.81)    Time: 1007-1219 PT Time Calculation (min) (ACUTE ONLY): 16 min   Charges:   PT Evaluation $PT Eval Moderate Complexity: 1 Mod            Wyona Almas, PT, DPT Acute Rehabilitation Services Pager [phone] Office [phone]   Deno Etienne 11/23/2019, 5:08 PM

## 2019-11-24 ENCOUNTER — Inpatient Hospital Stay (HOSPITAL_COMMUNITY): Payer: Medicare HMO

## 2019-11-24 DIAGNOSIS — I110 Hypertensive heart disease with heart failure: Secondary | ICD-10-CM

## 2019-11-24 DIAGNOSIS — E43 Unspecified severe protein-calorie malnutrition: Secondary | ICD-10-CM

## 2019-11-24 DIAGNOSIS — E119 Type 2 diabetes mellitus without complications: Secondary | ICD-10-CM

## 2019-11-24 LAB — CBC
HCT: 38 % — ABNORMAL LOW (ref 39.0–52.0)
Hemoglobin: 11.6 g/dL — ABNORMAL LOW (ref 13.0–17.0)
MCH: 29.1 pg (ref 26.0–34.0)
MCHC: 30.5 g/dL (ref 30.0–36.0)
MCV: 95.5 fL (ref 80.0–100.0)
Platelets: 146 10*3/uL — ABNORMAL LOW (ref 150–400)
RBC: 3.98 MIL/uL — ABNORMAL LOW (ref 4.22–5.81)
RDW: 14.1 % (ref 11.5–15.5)
WBC: 10.2 10*3/uL (ref 4.0–10.5)
nRBC: 0 % (ref 0.0–0.2)

## 2019-11-24 LAB — BASIC METABOLIC PANEL
Anion gap: 7 (ref 5–15)
BUN: 22 mg/dL (ref 8–23)
CO2: 25 mmol/L (ref 22–32)
Calcium: 8.9 mg/dL (ref 8.9–10.3)
Chloride: 112 mmol/L — ABNORMAL HIGH (ref 98–111)
Creatinine, Ser: 1.57 mg/dL — ABNORMAL HIGH (ref 0.61–1.24)
GFR calc Af Amer: 46 mL/min — ABNORMAL LOW (ref >60)
GFR calc non Af Amer: 39 mL/min — ABNORMAL LOW (ref >60)
Glucose, Bld: 167 mg/dL — ABNORMAL HIGH (ref 70–99)
Potassium: 4.6 mmol/L (ref 3.5–5.1)
Sodium: 144 mmol/L (ref 135–145)

## 2019-11-24 LAB — GLUCOSE, CAPILLARY
Glucose-Capillary: 103 mg/dL — ABNORMAL HIGH (ref 70–99)
Glucose-Capillary: 154 mg/dL — ABNORMAL HIGH (ref 70–99)
Glucose-Capillary: 186 mg/dL — ABNORMAL HIGH (ref 70–99)
Glucose-Capillary: 134 mg/dL — ABNORMAL HIGH (ref 70–99)
Glucose-Capillary: 117 mg/dL — ABNORMAL HIGH (ref 70–99)

## 2019-11-24 MED ORDER — METHYLPREDNISOLONE 4 MG PO TBPK
8.0000 mg | ORAL_TABLET | Freq: Every evening | ORAL | Status: AC
  Administered 2019-11-25: 8 mg via ORAL

## 2019-11-24 MED ORDER — METHYLPREDNISOLONE 4 MG PO TBPK: 4.0000 mg | ORAL_TABLET | ORAL | Status: AC

## 2019-11-24 MED ORDER — METHYLPREDNISOLONE 4 MG PO TBPK
4.0000 mg | ORAL_TABLET | ORAL | Status: AC
  Administered 2019-11-24: 4 mg via ORAL

## 2019-11-24 MED ORDER — METHYLPREDNISOLONE 4 MG PO TBPK
8.0000 mg | ORAL_TABLET | Freq: Every morning | ORAL | Status: AC
  Filled 2019-11-24 (×2): qty 21

## 2019-11-24 MED ORDER — METHYLPREDNISOLONE 4 MG PO TBPK
4.0000 mg | ORAL_TABLET | Freq: Four times a day (QID) | ORAL | Status: AC
  Administered 2019-11-26 – 2019-11-29 (×8): 4 mg via ORAL

## 2019-11-24 MED ORDER — LACTATED RINGERS IV SOLN: INTRAVENOUS | Status: AC

## 2019-11-24 MED ORDER — METHYLPREDNISOLONE 4 MG PO TBPK
8.0000 mg | ORAL_TABLET | Freq: Every evening | ORAL | Status: AC
  Administered 2019-11-24: 8 mg via ORAL

## 2019-11-24 MED ORDER — METHYLPREDNISOLONE 4 MG PO TBPK
4.0000 mg | ORAL_TABLET | Freq: Three times a day (TID) | ORAL | Status: AC
  Administered 2019-11-25 (×2): 4 mg via ORAL

## 2019-11-24 NOTE — Progress Notes (Signed)
Spoke with Avyaan's radiation oncologist, Dr. Isidore Moos, regarding the possible benefit of steroids. She agrees that there may be some benefit for him given his edema and to try a medrol dose pack. Will place order for this and continue to monitor.

## 2019-11-24 NOTE — Progress Notes (Signed)
Modified Barium Swallow Progress Note  Patient Details  Name: [name] MRN: [id number] Date of Birth: [birth year]  Today's Date: 11/24/2019  Modified Barium Swallow completed.  Full report located under Chart Review in the Imaging Section.  Brief recommendations include the following:  Clinical Impression  Pt was seen in radiology suite for modified barium swallow study. He was alert throughout the study which was completed in the lateral projection. Trials of puree, thin liquids, nectar thick liquids, and honey thick lqiuids were administered. Pt presents with moderate-severe pharyngeal dysphagia characterized by reduced lingual retraction, inconsistently incomplete epiglottic retroversion, reduced pharyngeal constriction, and reduced anterior laryngeal movement. He demonstrated moderate vallecular residue, mild-moderate pyriform sinus residue, mild pharyngeal wall residue, and pharyngeal delay. Penetration and sensed aspiration (PAS 3,5,6) were noted with all liquid consistencies. A chin tuck posture was effective in eliminating immediate penetration/aspiration of honey thick liquids but penetration and aspiration of material from the pyriform sinuses was noted after the swallow. Postural modifications were also attempted with other liquids but with no functional benefit. A chin tuck posture with left head turn was initially effective in reducing pharyngeal residue to a more functional level with puree solids but this strategy became increasing less effective with subsequent boluses.   Pt is at moderate to high risk for aspiration at this time before, during, and after deglutition and he reported at the end of the study that eating has become notably tiring. Considering his currently presentation combined with his reported weight loss, long-term non-oral alimentation is recommended at this time with supervised pleasure feedings. Pt was reduced regarding results and recommendation including that  of non-oral alimentation. He verbalized understanding as well as agreement with recommendations. However, should pt subsequently assume the refuse this and wish to have a p.o. diet, the safest diet would be puree with honey thick liquids. Compensatory strategies would include reduced bolus sizes, a liquid wash, and a chin tuck posture with left head turn; however, the risk of aspiration would remain. SLP will follow for treatment.    Swallow Evaluation Recommendations       SLP Diet Recommendations: NPO;Alternative means - long-term       Medication Administration: Via alternative means               Oral Care Recommendations: Oral care BID      Katoya Amato I. Hardin Negus, [place], [place] Office number [phone] Pager [place] 11/24/2019,4:11 PM

## 2019-11-24 NOTE — Evaluation (Addendum)
Occupational Therapy Evaluation Patient Details Name: [name] MRN: [id number] DOB: [birth year] Today's Date: 11/24/2019    History of Present Illness [name] is an 86 year old male with a PMHx of laryngeal cancer s/p radiation (most recent session 11/19/2019), HTN, HLD, DMII, CHF s/p ICD presenting with dysphagia, nausea/vomiting and weight loss. EGD findings without any esophageal abnormalities but possible oropharynx etiology of symptoms. ENT consulted and patient had transnasal laryngoscopy yesterday significant for right hemilarynx immobility and marked posterior laryngeal edema secondary to radiation.   Clinical Impression   This 86 y/o male presents with the above. PTA pt reports mod independence with ADL and functional mobility using quad cane, lives alone. Pt sleeping upon arrival but able to arouse and agreeable to working with therapist. Pt requesting to perform functional mobility into hallway, completing using RW and minguard-minA throughout. Pt requiring minA for toileting task after return to room, minA for LB ADL and setup assist for seated UB ADL. Pt will benefit from continued acute OT services and currently recommend follow up [facility] services after discharge to progress pt towards his PLOF. Will follow.     Follow Up Recommendations  Home health OT;Supervision/Assistance - 24 hour(24hr initially)    Equipment Recommendations  None recommended by OT           Precautions / Restrictions Precautions Precautions: Fall Restrictions Weight Bearing Restrictions: No      Mobility Bed Mobility Overal bed mobility: Needs Assistance Bed Mobility: Supine to Sit     Supine to sit: Min assist     General bed mobility comments: pt seeking HHA to assist with trunk elevation  Transfers Overall transfer level: Needs assistance Equipment used: Rolling walker (2 wheeled) Transfers: Sit to/from Stand Sit to Stand: Min guard         General transfer comment:  for safety and balance    Balance Overall balance assessment: Needs assistance Sitting-balance support: Feet supported Sitting balance-Leahy Scale: Good     Standing balance support: No upper extremity supported;During functional activity Standing balance-Leahy Scale: Fair                             ADL either performed or assessed with clinical judgement   ADL Overall ADL's : Needs assistance/impaired Eating/Feeding: Set up;Sitting   Grooming: Set up;Sitting   Upper Body Bathing: Min guard;Set up;Sitting   Lower Body Bathing: Minimal assistance;Sit to/from stand   Upper Body Dressing : Set up;Sitting   Lower Body Dressing: Minimal assistance;Sit to/from stand Lower Body Dressing Details (indicate cue type and reason): requires assist to don shoes seated EOB Toilet Transfer: Min guard;Minimal assistance;Ambulation;RW;BSC Toilet Transfer Details (indicate cue type and reason): use of BSC in room, room/hallway mobility Toileting- Clothing Manipulation and Hygiene: Minimal assistance;Sit to/from stand Toileting - Clothing Manipulation Details (indicate cue type and reason): assist for balance in standing while pt performs pericare after BM, able to stand >3-5 min during task completion      Functional mobility during ADLs: Minimal assistance;Min guard;Rolling walker                           Pertinent Vitals/Pain Pain Assessment: No/denies pain Faces Pain Scale: No hurt     Hand Dominance Right   Extremity/Trunk Assessment Upper Extremity Assessment Upper Extremity Assessment: Generalized weakness   Lower Extremity Assessment Lower Extremity Assessment: Defer to PT evaluation  Communication Communication Communication: HOH;Expressive difficulties(hoarse)   Cognition Arousal/Alertness: Awake/alert Behavior During Therapy: WFL for tasks assessed/performed Overall Cognitive Status: Difficult to assess                                  General Comments: pt HOH and communication difficulties, overall appears appropriate for functional tasks today    General Comments  VSS    Exercises     Shoulder Instructions      Home Living Family/patient expects to be discharged to:: Private residence Living Arrangements: Alone Available Help at Discharge: Available PRN/intermittently;Family(sister, son) Type of Home: Apartment Home Access: Ramped entrance     Home Layout: One level     Bathroom Shower/Tub: Teacher, early years/pre: [place]: Environmental consultant - 4 wheels;Cane - quad;Bedside commode   Additional Comments: uses sock aide      Prior Functioning/Environment Level of Independence: Independent with assistive device(s)        Comments: Uses quad cane, has transportation to radiation appointments, independent ADL's/meals        OT Problem List: Decreased strength;Decreased range of motion;Decreased activity tolerance;Impaired balance (sitting and/or standing);Decreased knowledge of use of DME or AE      OT Treatment/Interventions: Self-care/ADL training;Therapeutic exercise;Energy conservation;DME and/or AE instruction;Therapeutic activities;Patient/family education;Balance training    OT Goals(Current goals can be found in the care plan section) Acute Rehab OT Goals Patient Stated Goal: did not state OT Goal Formulation: With patient Time For Goal Achievement: 12/08/19 Potential to Achieve Goals: Good  OT Frequency: Min 2X/week   Barriers to D/C:            Co-evaluation              AM-PAC OT "6 Clicks" Daily Activity     Outcome Measure Help from another person eating meals?: None Help from another person taking care of personal grooming?: A Little Help from another person toileting, which includes using toliet, bedpan, or urinal?: A Little Help from another person bathing (including washing, rinsing, drying)?: A Little Help from another person to put on  and taking off regular upper body clothing?: None Help from another person to put on and taking off regular lower body clothing?: A Little 6 Click Score: 20   End of Session Equipment Utilized During Treatment: Gait belt;Rolling walker Nurse Communication: Mobility status  Activity Tolerance: Patient tolerated treatment well Patient left: in chair;with call bell/phone within reach;with chair alarm set  OT Visit Diagnosis: Unsteadiness on feet (R26.81);Muscle weakness (generalized) (M62.81)                Time: 5102-5852 OT Time Calculation (min): 36 min Charges:  OT General Charges $OT Visit: 1 Visit OT Evaluation $OT Eval Moderate Complexity: 1 Mod OT Treatments $Self Care/Home Management : 8-22 mins  Lou Cal, OT Supplemental Rehabilitation Services Pager [phone] Office [phone]   Raymondo Band 11/24/2019, 5:16 PM

## 2019-11-24 NOTE — Progress Notes (Signed)
  Speech Language Pathology Treatment: Dysphagia  Patient Details Name: [name] MRN: [id number] DOB: 1931/04/26 Today's Date: 11/24/2019 Time: 0912-0928 SLP Time Calculation (min) (ACUTE ONLY): 16 min  Assessment / Plan / Recommendation Clinical Impression  Prior ST notes reviewed (from outpatient and inpatient). Session focused on dysphagia, consumption with po's and determination of instrumental study for objective information. Pt woke easily from sleep but little drowsy and tissue/skin at neck area with discoloration (likely burns from radiation). Following repositioning he was seen at bedside with consumption of honey thick liquids without specific instruction needed. Vocal quality noted wet on arrival and immediate throat clears after trials was consistent. He independently tucked his chin as recommended to him at some point. To evaluate present oropharyngeal function, an MBS is warranted and scheduled for today at 1330. May continue puree and honey thick liquids until then.    HPI HPI: 86yo male admitted 11/20/19 with FTT, weight loss and difficulty swallowing. Severe throat pain. PMH: laryngeal cancer s/p 20 radiation tx (last tx 11/19/19), HTN, HLD, DM2, CHF. 06/2019 laryngoscopy  = irregular right vocal fold  and right hemilarynx with decreased movement. 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 Seen at outpatient [facility] for bedside swallow eval and for home excerise program (10/30/19).      SLP Plan  MBS       Recommendations  Diet recommendations: Honey-thick liquid;Dysphagia 1 (puree) Liquids provided via: Cup Medication Administration: Crushed with puree Supervision: Patient able to self feed;Intermittent supervision to cue for compensatory strategies Compensations: Slow rate;Small sips/bites;Clear throat intermittently Postural Changes and/or Swallow Maneuvers: Seated upright 90 degrees                Oral Care  Recommendations: Oral care BID Follow up Recommendations: Other (comment)(TB already) Plan: MBS                       Houston Siren 11/24/2019, 9:57 AM  [name] Colvin Caroli.Ed Risk analyst [phone] Office [phone]

## 2019-11-24 NOTE — Progress Notes (Addendum)
OT Cancellation Note  Patient Details Name: [name] MRN: [id number] DOB: October 03, 1930   Cancelled Treatment:    Reason Eval/Treat Not Completed: Fatigue/lethargy limiting ability to participate; pt sleeping upon entry to room, shaking head no when asking to mobilize with therapies, noted difficulty maintaining eyes open. Pt agreeable to OT checking back at a later time. Will follow up for OT eval as able.  Of note checked back on Pt this PM, pt currently out of room. Will follow up for OT eval as schedule permits.  Lou Cal, OT Supplemental Rehabilitation Services Pager [phone] Office [phone]   Raymondo Band 11/24/2019, 8:45 AM

## 2019-11-24 NOTE — Plan of Care (Signed)

## 2019-11-24 NOTE — TOC Initial Note (Addendum)
Transition of Care [facility]) - Initial/Assessment Note    Patient Details  Name: [name] MRN: [id number] Date of Birth: [birth year]  [facility] CM/SW Contact:    Marilu Favre, RN Phone Number: 11/24/2019, 2:17 PM  Clinical Narrative:                  Patient from home has walker and cane.   PT recommending HHPT , offered choice, patient prefers [facility] spoke to [place] with [facility] she can accept.   Patient was active with [facility] for [facility] speech , will need orders for resumption   Continue to follow for possible G tube placement.   Will need home health orders and face to face prior to discharge. Expected Discharge Plan: [place] Barriers to Discharge: Continued Medical Work up   Patient Goals and CMS Choice Patient states their goals for this hospitalization and ongoing recovery are:: to return to home CMS Medicare.gov Compare Post Acute Care list provided to:: Patient Choice offered to / list presented to : Patient  Expected Discharge Plan and Services Expected Discharge Plan: [place]   Discharge Planning Services: CM Consult Post Acute Care Choice: [place] arrangements for the past 2 months: Apartment                 DME Arranged: N/A         HH Arranged: PT HH Agency: [facility] Date [place]: 11/24/19 Time [place]: 1638 Representative spoke with at [place]: [place] Arrangements/Services Living arrangements for the past 2 months: [place] with:: Self Patient language and need for interpreter reviewed:: Yes        Need for Family Participation in Patient Care: Yes (Comment) Care giver support system in place?: Yes (comment) Current home services: DME Criminal Activity/Legal Involvement Pertinent to Current Situation/Hospitalization: No - Comment as needed  Activities of Daily Living Home Assistive Devices/Equipment: Cane  (specify quad or straight) ADL Screening (condition at time of admission) Patient's cognitive ability adequate to safely complete daily activities?: Yes Is the patient deaf or have difficulty hearing?: No Does the patient have difficulty seeing, even when wearing glasses/contacts?: No Does the patient have difficulty concentrating, remembering, or making decisions?: No Patient able to express need for assistance with ADLs?: Yes Does the patient have difficulty dressing or bathing?: No Independently performs ADLs?: Yes (appropriate for developmental age) Does the patient have difficulty walking or climbing stairs?: No Weakness of Legs: Both Weakness of Arms/Hands: None  Permission Sought/Granted   Permission granted to share information with : No              Emotional Assessment Appearance:: Appears stated age Attitude/Demeanor/Rapport: Engaged Affect (typically observed): Accepting Orientation: : Oriented to Situation, Oriented to  Time, Oriented to Place, Oriented to Self Alcohol / Substance Use: Not Applicable    Admission diagnosis:  Failure to thrive in adult [R62.7] Patient Active Problem List   Diagnosis Date Noted  . Protein-calorie malnutrition, severe 11/22/2019  . Dysphagia 11/21/2019  . Enlarged prostate   . Malignant neoplasm of glottis ([place]) 07/10/2019  . Pharyngeal dysphagia 07/10/2019  . Dysphonia 07/10/2019  . Hypertensive retinopathy of both eyes 02/05/2018  . Mild nonproliferative diabetic retinopathy of both eyes without macular edema associated with type 2 diabetes mellitus ([place]) 02/05/2018  . Dry eye syndrome of both eyes 06/06/2017  . Weight loss 12/11/2016  .  Advanced care planning/counseling discussion 12/11/2016  . Urinary retention 01/13/2016  . Healthcare maintenance 11/22/2015  . [name] (chronic kidney disease) stage 3, GFR 30-59 ml/min (HCC) 12/04/2014  . Automatic implantable cardioverter-defibrillator in situ 01/16/2012  . Constipation  03/10/2009  . Coronary atherosclerosis 12/25/2007  . Chronic systolic congestive heart failure ([place]) 12/25/2007  . Hyperlipidemia 01/02/2007  . Type 2 diabetes mellitus with peripheral vascular disease ([place]) 01/01/1989   PCP:  Axel Filler, MD Pharmacy:   [facility] [place], [place] - [address] AT [address] [place] [address] Phone: [phone] Fax: [phone]     Social Determinants of Health (SDOH) Interventions    Readmission Risk Interventions No flowsheet data found.

## 2019-11-25 LAB — GLUCOSE, CAPILLARY
Glucose-Capillary: 141 mg/dL — ABNORMAL HIGH (ref 70–99)
Glucose-Capillary: 260 mg/dL — ABNORMAL HIGH (ref 70–99)
Glucose-Capillary: 205 mg/dL — ABNORMAL HIGH (ref 70–99)
Glucose-Capillary: 234 mg/dL — ABNORMAL HIGH (ref 70–99)

## 2019-11-25 LAB — BASIC METABOLIC PANEL
Anion gap: 6 (ref 5–15)
BUN: 21 mg/dL (ref 8–23)
CO2: 26 mmol/L (ref 22–32)
Calcium: 9.1 mg/dL (ref 8.9–10.3)
Chloride: 111 mmol/L (ref 98–111)
Creatinine, Ser: 1.61 mg/dL — ABNORMAL HIGH (ref 0.61–1.24)
GFR calc Af Amer: 44 mL/min — ABNORMAL LOW (ref >60)
GFR calc non Af Amer: 38 mL/min — ABNORMAL LOW (ref >60)
Glucose, Bld: 165 mg/dL — ABNORMAL HIGH (ref 70–99)
Potassium: 4.7 mmol/L (ref 3.5–5.1)
Sodium: 143 mmol/L (ref 135–145)

## 2019-11-25 LAB — CBC
HCT: 35.9 % — ABNORMAL LOW (ref 39.0–52.0)
Hemoglobin: 11 g/dL — ABNORMAL LOW (ref 13.0–17.0)
MCH: 29.4 pg (ref 26.0–34.0)
MCHC: 30.6 g/dL (ref 30.0–36.0)
MCV: 96 fL (ref 80.0–100.0)
Platelets: 201 10*3/uL (ref 150–400)
RBC: 3.74 MIL/uL — ABNORMAL LOW (ref 4.22–5.81)
RDW: 14.1 % (ref 11.5–15.5)
WBC: 7.2 10*3/uL (ref 4.0–10.5)
nRBC: 0 % (ref 0.0–0.2)

## 2019-11-25 MED ORDER — CEFAZOLIN SODIUM-DEXTROSE 2-4 GM/100ML-% IV SOLN
2.0000 g | INTRAVENOUS | Status: AC
  Administered 2019-11-27: 2 g via INTRAVENOUS
  Filled 2019-11-25: qty 100

## 2019-11-25 MED ORDER — CEFAZOLIN SODIUM-DEXTROSE 2-4 GM/100ML-% IV SOLN: 2.0000 g | INTRAVENOUS | Status: DC

## 2019-11-25 MED ORDER — ORAL CARE MOUTH RINSE
15.0000 mL | Freq: Two times a day (BID) | OROMUCOSAL | Status: DC
  Administered 2019-11-25 – 2019-11-30 (×10): 15 mL via OROMUCOSAL

## 2019-11-25 MED ORDER — SENNOSIDES-DOCUSATE SODIUM 8.6-50 MG PO TABS
2.0000 | ORAL_TABLET | Freq: Two times a day (BID) | ORAL | Status: DC
  Administered 2019-11-25 – 2019-11-28 (×4): 2 via ORAL
  Filled 2019-11-25 (×6): qty 2

## 2019-11-25 MED ORDER — DEXTROSE 5 % IV SOLN: INTRAVENOUS | Status: DC

## 2019-11-25 MED ORDER — INSULIN ASPART 100 UNIT/ML ~~LOC~~ SOLN
0.0000 [IU] | Freq: Three times a day (TID) | SUBCUTANEOUS | Status: DC
  Administered 2019-11-25: 5 [IU] via SUBCUTANEOUS

## 2019-11-25 MED ORDER — INSULIN ASPART 100 UNIT/ML ~~LOC~~ SOLN
0.0000 [IU] | SUBCUTANEOUS | Status: DC
  Administered 2019-11-26: 8 [IU] via SUBCUTANEOUS

## 2019-11-25 NOTE — Progress Notes (Signed)
Nutrition Follow-up  DOCUMENTATION CODES:   Underweight, Severe malnutrition in context of chronic illness  INTERVENTION:  Naval architect Cup with meals Continue snacks TID  Monitor for PEG placment, when able recommend initiating -Osmolite 1.2 @ 20 ml/hr, advance 10 ml every 8 hrs to goal rate 60 ml/hr (1440 ml/day)  Provides: 1728 kcal, 80 grams of protein, and 1181 ml free water   NUTRITION DIAGNOSIS:   Severe Malnutrition related to chronic illness, cancer and cancer related treatments as evidenced by severe fat depletion, severe muscle depletion, energy intake < or equal to 75% for > or equal to 1 month, percent weight loss.  Ongoing  GOAL:   Patient will meet greater than or equal to 90% of their needs  Progressing  MONITOR:   PO intake, Supplement acceptance, Weight trends, Diet advancement, Labs, I & O's  REASON FOR ASSESSMENT:   Consult Assessment of nutrition requirement/status  ASSESSMENT:  RD working remotely.  Patient with PMH significant for laryngeal cancer s/p radiation (last treatment 2/25), HTN, HLD, DM, CHF s/p ICD, and PVD. Presents this admission with failure to thrive.  Per 3/1 MD notes, improving po intake, possibility of avoiding feeding tube. MBS completed later in the afternoon revealed aspiration. SLP recommended long term non-oral alimentation with supervised pleasure feedings.   IR for PEG placement planned for 3/04.  Patient currently on dysphagia I with honey thickened liquids. Meal history reviewed, eating 0-90% of the last 8 documented meals (46% average) and has been consuming magic cup with meals as well as snacks three times daily.    Current wt 116.6 lbs      Admit wt 117.04 lbs  Medications reviewed and include: Marinol, SSI, Methylprednisolone, Protonix, Senokot  D5 @ 75 ml/hr provides 306 kcal  Labs: CBGs 260, 141, 117, 134   Diet Order:   Diet Order            DIET - DYS 1 Room service appropriate? No; Fluid  consistency: Honey Thick  Diet effective now              EDUCATION NEEDS:   Education needs have been addressed  Skin:  Skin Assessment: Reviewed RN Assessment  Last BM:  3/1  Height:   Ht Readings from Last 1 Encounters:  11/20/19 5\' 11"  (1.803 m)    Weight:   Wt Readings from Last 1 Encounters:  11/24/19 53 kg    Ideal Body Weight:  78.2 kg  BMI:  Body mass index is 16.3 kg/m.  Estimated Nutritional Needs:   Kcal:  1600-1800 kcal  Protein:  80-95 grams  Fluid:  >/= 1.6 L/day   Lajuan Lines, RD, LDN Clinical Nutrition After Hours/Weekend Pager # in [place]

## 2019-11-25 NOTE — Consult Note (Signed)
Chief Complaint: Patient was seen in consultation today for percutaneous gastric tube placement Chief Complaint  Patient presents with  . Emesis  . Weakness   at the request of Dr Verlin Dike   Supervising Physician: [name]  Patient Status: [facility] - In-pt  History of Present Illness: [name] is a 86 y.o. male   Hx laryngeal cancer S/p radiation 11/19/19 Dysphagia; wt loss Post radiation laryngeal edema Protein calorie malnutrition  Recent swallow study shows high aspiration risk Rec: NPO  Request for percutaneous gastric tube placement Imaging reviewed and approved by Dr [name]  Scheduled for IR procedure 3/4  Past Medical History:  Diagnosis Date  . Adenomatous colon polyp 02/14/2012  . AICD (automatic cardioverter/defibrillator) present    Medtronic   . BBB (bundle branch block)    right  . Carotid stenosis    a. s/p L carotid stent 2004;  b. Carotid US (09/2014): Bilateral ICA 1-39%, left ECA >59%, normal subclavian bilaterally, occluded left vertebral >> FU 2 years  . Chronic kidney disease   . Chronic systolic CHF (congestive heart failure) (HCC)    a. ischemic CM EF 15-20%;  b. s/p AICD 05/24/04;  c. Echo 7/06: EF 30-40%, mild reduced RVSF, d. Echo 12/2015 EF 35-40%  . Elevated PSA   . History of kidney stones    x 1  . HTN (hypertension)   . Hyperlipidemia   . ICD (implantable cardiac defibrillator) in place 12-25-2012   MDT CRTD upgrade by Dr Lovena Le  . Myocardial infarction ([place]) 1990  . Pericardial effusion    Echocardiogram (09/2014): EF 25% with distal anterior, distal inferior, distal lateral and apical akinesis, grade 1 diastolic dysfunction, very mild aortic stenosis (mean 7 mmHg) - this may be depressed due to low EF (2-D images suggest mild to moderate aortic stenosis), large pericardial effusion, no RA collapse  . Pneumonia   . Presence of permanent cardiac pacemaker    Medtronic  . PVD (peripheral vascular disease) ([place])    s/p L  carotid PTCA/stent 2004  . Transient ischemic attack   . Type II diabetes mellitus ([place])    type II    Past Surgical History:  Procedure Laterality Date  . BI-VENTRICULAR IMPLANTABLE CARDIOVERTER DEFIBRILLATOR UPGRADE N/A 12/25/2012   Procedure: BI-VENTRICULAR IMPLANTABLE CARDIOVERTER DEFIBRILLATOR UPGRADE;  Surgeon: Evans Lance, MD;  Location[facility] C[facility] LAB;  Service: Cardiovascular;  Laterality: N/A;  . BIV ICD UPGRADE  12/25/2012   MDT CRTD upgrade by Dr Lovena Le for ischemic cardiomyopathy and worsening conduction system disease  . CARDIAC CATHETERIZATION  06/2003,  01/2004  . CARDIAC CATHETERIZATION N/A 01/13/2016   Procedure: Left Heart Cath and Coronary Angiography;  Surgeon: Jettie Booze, MD;  Location: [place] CV LAB;  Service: Cardiovascular;  Laterality: N/A;  . CARDIAC DEFIBRILLATOR PLACEMENT  05/24/2004   Implantation of a MDT single-chamber defibrillator  . CAROTID STENT  09/11/2003   Percutaneous transluminal angioplasty and stent placement of the left internal carotid artery.  Marland Kitchen CATARACT EXTRACTION W/ INTRAOCULAR LENS  IMPLANT, BILATERAL Bilateral ~ 2010  . CORONARY ANGIOPLASTY WITH STENT PLACEMENT  1990   "2" (12/25/2012)  . CYSTOSCOPY    . DIRECT LARYNGOSCOPY N/A 08/15/2019   Procedure: MICRODIRECT LARYNGOSCOPY WITH BIOPSY;  Surgeon: Melida Quitter, MD;  Location: [place];  Service: ENT;  Laterality: N/A;  . ESOPHAGOGASTRODUODENOSCOPY (EGD) WITH PROPOFOL N/A 11/22/2019   Procedure: ESOPHAGOGASTRODUODENOSCOPY (EGD) WITH PROPOFOL with possible dilation;  Surgeon: Clarene Essex, MD;  Location: [place];  Service: Endoscopy;  Laterality: N/A;  . INSERT / REPLACE / REMOVE PACEMAKER    . LEAD REVISION N/A 12/25/2012   Procedure: LEAD REVISION;  Surgeon: Evans Lance, MD;  Location: [facility] CATH LAB;  Service: Cardiovascular;  Laterality: N/A;  . RIGID ESOPHAGOSCOPY N/A 08/15/2019   Procedure: RIGID ESOPHAGOSCOPY;  Surgeon: Melida Quitter, MD;  Location: [facility] OR;  Service: ENT;   Laterality: N/A;    Allergies: Patient has no known allergies.  Medications: Prior to Admission medications   Medication Sig Start Date End Date Taking? Authorizing Provider  atorvastatin (LIPITOR) 40 MG tablet TAKE 1 TABLET(40 MG) BY MOUTH DAILY AT 6 PM Patient taking differently: Take 40 mg by mouth daily at 6 PM.  03/03/19  Yes Axel Filler, MD  bisacodyl (DULCOLAX) 5 MG EC tablet Take 2 tablets (10 mg total) by mouth daily. 07/31/19 07/30/20 Yes Georgette Shell, MD  carvedilol (COREG) 6.25 MG tablet Take 1 tablet (6.25 mg total) by mouth 2 (two) times daily with a meal. 07/31/19  Yes Georgette Shell, MD  fluorometholone (FML) 0.1 % ophthalmic suspension Place 2 drops into both eyes 2 (two) times daily.   Yes [provider]  furosemide (LASIX) 40 MG tablet Take 1 tablet (40 mg total) by mouth 2 (two) times daily. 04/14/19  Yes Evans Lance, MD  insulin glargine (LANTUS) 100 UNIT/ML injection Inject 0.07 mLs (7 Units total) into the skin daily. 05/27/19  Yes Madalyn Rob, MD  lidocaine (XYLOCAINE) 2 % solution Patient: Mix 1part 2% viscous lidocaine, 1part H20. Swallow 22m of diluted mixture, 326m before meals and at bedtime, up to QID 10/27/19  Yes SqEppie GibsonMD  ofloxacin (OCUFLOX) 0.3 % ophthalmic solution 1 drop 4 (four) times daily. 11/06/19  Yes [provider]  pantoprazole (PROTONIX) 40 MG tablet Take 1 tablet (40 mg total) by mouth daily. 10/07/19  Yes ViAxel FillerMD  polyethylene glycol (MIRALAX / GLYCOLAX) 17 g packet Take 17 g by mouth daily. 07/31/19  Yes MaGeorgette ShellMD  senna-docusate (SENOKOT-S) 8.6-50 MG tablet Take 2 tablets by mouth daily. Patient taking differently: Take 2 tablets by mouth daily as needed for mild constipation.  05/04/16  Yes PaKonrad FelixPA  tamsulosin (FLOMAX) 0.4 MG CAPS capsule Take 0.8 mg by mouth at bedtime.    Yes [provider]  VENTOLIN HFA 108 (90 Base) MCG/ACT inhaler INHALE  2 PUFFS INTO THE LUNGS EVERY 6 HOURS AS NEEDED FOR WHEEZING OR SHORTNESS OF BREATH Patient taking differently: Inhale 2 puffs into the lungs every 6 (six) hours as needed for wheezing or shortness of breath.  03/15/16  Yes ViAxel FillerMD  alum & mag hydroxide-simeth (MAALOX/MYLANTA) 200-200-20 MG/5ML suspension Take 15 mLs by mouth every 6 (six) hours as needed for indigestion or heartburn. Patient not taking: Reported on 11/20/2019 03/27/19   Wieters, HaMadelynn Done, PA-C  Blood Glucose Monitoring Suppl (ACCU-CHEK AVIVA PLUS) w/Device KIT Check finger stick glucose once daily 05/06/18   ViAxel FillerMD  glucose blood (ACCU-CHEK AVIVA PLUS) test strip Check blood sugar up to 3 times a day 09/15/19   ViAxel FillerMD  Hydrocortisone, Perianal, (PROCTO-PAK) 1 % CREA Apply 1 application topically 2 (two) times daily. Patient not taking: Reported on 11/20/2019 07/25/19   NeRaylene EvertsMD  Insulin Pen Needle (B-D ULTRAFINE III SHORT PEN) 31G X 8 MM MISC USE ONCE DAILY WITH LANTUS PEN 05/06/18   ViAxel FillerMD  lidocaine (XYLOCAINE) 2 % jelly Apply 1 application topically as needed. Patient not taking: Reported on 11/20/2019 07/25/19   Raylene Everts, MD     Family History  Problem Relation Age of Onset  . Diabetes Mother   . Healthy Father   . Diabetes Brother   . Heart attack Neg Hx   . Stroke Neg Hx     Social History   Socioeconomic History  . Marital status: Widowed    Spouse name: Not on file  . Number of children: Not on file  . Years of education: 75  . Highest education level: Not on file  Occupational History  . Occupation: retired  Tobacco Use  . Smoking status: Former Smoker    Types: Cigarettes    Quit date: 12/27/1967    Years since quitting: 51.9  . Smokeless tobacco: Never Used  Substance and Sexual Activity  . Alcohol use: No    Alcohol/week: 0.0 standard drinks    Comment: 12/25/2012 "quit all alcohol 60 yr ago"  . Drug use:  No  . Sexual activity: Not on file  Other Topics Concern  . Not on file  Social History Narrative   ** Merged History Encounter **       Single, 2 adult children, daughter in [place], son in [place] Strain:   . Difficulty of Paying Living Expenses: Not on file  Food Insecurity:   . Worried About Charity fundraiser in the Last Year: Not on file  . Ran Out of Food in the Last Year: Not on file  Transportation Needs:   . Lack of Transportation (Medical): Not on file  . Lack of Transportation (Non-Medical): Not on file  Physical Activity:   . Days of Exercise per Week: Not on file  . Minutes of Exercise per Session: Not on file  Stress:   . Feeling of Stress : Not on file  Social Connections:   . Frequency of Communication with Friends and Family: Not on file  . Frequency of Social Gatherings with Friends and Family: Not on file  . Attends Religious Services: Not on file  . Active Member of Clubs or Organizations: Not on file  . Attends Archivist Meetings: Not on file  . Marital Status: Not on file     Review of Systems: A 12 point ROS discussed and pertinent positives are indicated in the HPI above.  All other systems are negative.  Review of Systems  Constitutional: Positive for activity change, appetite change, fatigue and unexpected weight change. Negative for fever.  Respiratory: Negative for cough and shortness of breath.   Gastrointestinal: Negative for abdominal pain.  Neurological: Positive for weakness.  Psychiatric/Behavioral: Negative for behavioral problems and confusion.    Vital Signs: BP 102/67 (BP Location: Right Arm)   Pulse 78   Temp 98 F (36.7 C) (Oral)   Resp 16   Ht '5\' 11"'$  (1.803 m)   Wt 116 lb 13.5 oz (53 kg)   SpO2 98%   BMI 16.30 kg/m   Physical Exam Vitals reviewed.  Cardiovascular:     Rate and Rhythm: Normal rate and regular rhythm.     Heart sounds: Normal heart  sounds.  Pulmonary:     Effort: Pulmonary effort is normal.     Breath sounds: Normal breath sounds.  Abdominal:     Palpations: Abdomen is soft.  Musculoskeletal:  General: Normal range of motion.  Skin:    General: Skin is warm and dry.  Neurological:     Mental Status: He is alert and oriented to person, place, and time.  Psychiatric:        Behavior: Behavior normal.        Thought Content: Thought content normal.        Judgment: Judgment normal.     Imaging: DG Foot Complete Left  Result Date: 11/24/2019 CLINICAL DATA:  Plantar foot pain EXAM: LEFT FOOT - COMPLETE 3+ VIEW COMPARISON:  None. FINDINGS: Plantar calcaneal spur. No acute bony abnormality. Specifically, no fracture, subluxation, or dislocation. Soft tissues unremarkable. IMPRESSION: No acute bony abnormality.  Calcaneal spur. Electronically Signed   By: Rolm Baptise M.D.   On: 11/24/2019 14:10   DG Swallowing Func-Speech Pathology  Result Date: 11/24/2019 Objective Swallowing Evaluation: Type of Study: MBS-Modified Barium Swallow Study  Patient Details Name: [name] MRN: [id number] Date of Birth: Feb 18, 1931 Today's Date: 11/24/2019 Time: SLP Start Time (ACUTE ONLY): 2836 -SLP Stop Time (ACUTE ONLY): 1345 SLP Time Calculation (min) (ACUTE ONLY): 17 min Past Medical History: Past Medical History: Diagnosis Date . Adenomatous colon polyp 02/14/2012 . AICD (automatic cardioverter/defibrillator) present   Medtronic  . BBB (bundle branch block)   right . Carotid stenosis   a. s/p L carotid stent 2004;  b. Carotid US (09/2014): Bilateral ICA 1-39%, left ECA >59%, normal subclavian bilaterally, occluded left vertebral >> FU 2 years . Chronic kidney disease  . Chronic systolic CHF (congestive heart failure) (HCC)   a. ischemic CM EF 15-20%;  b. s/p AICD 05/24/04;  c. Echo 7/06: EF 30-40%, mild reduced RVSF, d. Echo 12/2015 EF 35-40% . Elevated PSA  . History of kidney stones   x 1 . HTN (hypertension)  . Hyperlipidemia  . ICD  (implantable cardiac defibrillator) in place 12-25-2012  MDT CRTD upgrade by Dr Lovena Le . Myocardial infarction ([place]) 1990 . Pericardial effusion   Echocardiogram (09/2014): EF 25% with distal anterior, distal inferior, distal lateral and apical akinesis, grade 1 diastolic dysfunction, very mild aortic stenosis (mean 7 mmHg) - this may be depressed due to low EF (2-D images suggest mild to moderate aortic stenosis), large pericardial effusion, no RA collapse . Pneumonia  . Presence of permanent cardiac pacemaker   Medtronic . PVD (peripheral vascular disease) ([place])   s/p L carotid PTCA/stent 2004 . Transient ischemic attack  . Type II diabetes mellitus ([place])   type II Past Surgical History: Past Surgical History: Procedure Laterality Date . BI-VENTRICULAR IMPLANTABLE CARDIOVERTER DEFIBRILLATOR UPGRADE N/A 12/25/2012  Procedure: BI-VENTRICULAR IMPLANTABLE CARDIOVERTER DEFIBRILLATOR UPGRADE;  Surgeon: Evans Lance, MD;  Location: [facility] CATH LAB;  Service: Cardiovascular;  Laterality: N/A; . BIV ICD UPGRADE  12/25/2012  MDT CRTD upgrade by Dr Lovena Le for ischemic cardiomyopathy and worsening conduction system disease . CARDIAC CATHETERIZATION  06/2003,  01/2004 . CARDIAC CATHETERIZATION N/A 01/13/2016  Procedure: Left Heart Cath and Coronary Angiography;  Surgeon: Jettie Booze, MD;  Location: [place] CV LAB;  Service: Cardiovascular;  Laterality: N/A; . CARDIAC DEFIBRILLATOR PLACEMENT  05/24/2004  Implantation of a MDT single-chamber defibrillator . CAROTID STENT  09/11/2003  Percutaneous transluminal angioplasty and stent placement of the left internal carotid artery. Marland Kitchen CATARACT EXTRACTION W/ INTRAOCULAR LENS  IMPLANT, BILATERAL Bilateral ~ 2010 . CORONARY ANGIOPLASTY WITH STENT PLACEMENT  1990  "2" (12/25/2012) . CYSTOSCOPY   . DIRECT LARYNGOSCOPY N/A 08/15/2019  Procedure: MICRODIRECT LARYNGOSCOPY WITH BIOPSY;  Surgeon: Redmond Baseman,  Orpah Greek, MD;  Location: [place];  Service: ENT;  Laterality: N/A; . ESOPHAGOGASTRODUODENOSCOPY  (EGD) WITH PROPOFOL N/A 11/22/2019  Procedure: ESOPHAGOGASTRODUODENOSCOPY (EGD) WITH PROPOFOL with possible dilation;  Surgeon: Clarene Essex, MD;  Location: [place];  Service: Endoscopy;  Laterality: N/A; . INSERT / REPLACE / REMOVE PACEMAKER   . LEAD REVISION N/A 12/25/2012  Procedure: LEAD REVISION;  Surgeon: Evans Lance, MD;  Location: [facility] CATH LAB;  Service: Cardiovascular;  Laterality: N/A; . RIGID ESOPHAGOSCOPY N/A 08/15/2019  Procedure: RIGID ESOPHAGOSCOPY;  Surgeon: Melida Quitter, MD;  Location: [facility] OR;  Service: ENT;  Laterality: N/A; HPI: Pt is an 86 y.o. male admitted 11/20/19 with FTT, weight loss and difficulty swallowing. Severe throat pain. PMH: laryngeal cancer s/p 20 radiation tx (last tx 11/19/19), HTN, HLD, DM2, CHF. 06/2019 laryngoscopy  = irregular right vocal fold  and right hemilarynx with decreased movement. 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 Seen at outpatient [facility] for bedside swallow eval and for home excerise program (10/30/19).  Subjective: Pt reports he vomits whatever he eats. Assessment / Plan / Recommendation CHL IP CLINICAL IMPRESSIONS 11/24/2019 Clinical Impression Pt was seen in radiology suite for modified barium swallow study. He was alert throughout the study which was completed in the lateral projection. Trials of puree, thin liquids, nectar thick liquids, and honey thick lqiuids were administered. Pt presents with moderate-severe pharyngeal dysphagia characterized by reduced lingual retraction, inconsistently incomplete epiglottic retroversion, reduced pharyngeal constriction, and reduced anterior laryngeal movement. He demonstrated moderate vallecular residue, mild-moderate pyriform sinus residue, mild pharyngeal wall residue, and pharyngeal delay. Penetration and sensed aspiration (PAS 3,5,6) were noted with all liquid consistencies. A chin tuck posture was effective in eliminating immediate penetration/aspiration  of honey thick liquids but penetration and aspiration of material from the pyriform sinuses was noted after the swallow. Postural modifications were also attempted with other liquids but with no functional benefit. A chin tuck posture with left head turn was initially effective in reducing pharyngeal residue to a more functional level with puree solids but this strategy became increasing less effective with subsequent boluses. Pt is at moderate to high risk for aspiration at this time before, during, and after deglutition and he reported at the end of the study that eating has become notably tiring. Considering his currently presentation combined with his reported weight loss, long-term non-oral alimentation is recommended at this time with supervised pleasure feedings. Pt was reduced regarding results and recommendation including that of non-oral alimentation. He verbalized understanding as well as agreement with recommendations. However, should pt subsequently assume the refuse this and wish to have a p.o. diet, the safest diet would be puree with honey thick liquids. Compensatory strategies would include reduced bolus sizes, a liquid wash, and a chin tuck posture with left head turn; however, the risk of aspiration would remain. SLP will follow for treatment.  SLP Visit Diagnosis Dysphagia, pharyngeal phase (R13.13) Attention and concentration deficit following -- Frontal lobe and executive function deficit following -- Impact on safety and function Moderate aspiration risk;Severe aspiration risk   CHL IP TREATMENT RECOMMENDATION 11/21/2019 Treatment Recommendations Therapy as outlined in treatment plan below   Prognosis 11/24/2019 Prognosis for Safe Diet Advancement Fair Barriers to Reach Goals -- Barriers/Prognosis Comment laryngeal cancer and radiation treatment CHL IP DIET RECOMMENDATION 11/24/2019 SLP Diet Recommendations NPO;Alternative means - long-term Liquid Administration via -- Medication Administration Via  alternative means Compensations -- Postural Changes --   CHL IP OTHER  RECOMMENDATIONS 11/24/2019 Recommended Consults -- Oral Care Recommendations Oral care BID Other Recommendations --   CHL IP FOLLOW UP RECOMMENDATIONS 11/24/2019 Follow up Recommendations Other (comment)   CHL IP FREQUENCY AND DURATION 11/21/2019 Speech Therapy Frequency (ACUTE ONLY) min 2x/week Treatment Duration 1 week;2 weeks      CHL IP ORAL PHASE 05/21/2019 Oral Phase Impaired Oral - Pudding Teaspoon -- Oral - Pudding Cup -- Oral - Honey Teaspoon -- Oral - Honey Cup -- Oral - Nectar Teaspoon -- Oral - Nectar Cup -- Oral - Nectar Straw Lingual pumping;Reduced posterior propulsion;Piecemeal swallowing;Delayed oral transit;Decreased bolus cohesion Oral - Thin Teaspoon -- Oral - Thin Cup -- Oral - Thin Straw Lingual pumping;Reduced posterior propulsion;Piecemeal swallowing;Delayed oral transit;Decreased bolus cohesion Oral - Puree Lingual pumping;Reduced posterior propulsion;Piecemeal swallowing;Delayed oral transit;Decreased bolus cohesion Oral - Mech Soft Lingual pumping;Reduced posterior propulsion;Piecemeal swallowing;Delayed oral transit;Decreased bolus cohesion Oral - Regular -- Oral - Multi-Consistency -- Oral - Pill -- Oral Phase - Comment --  CHL IP PHARYNGEAL PHASE 11/24/2019 Pharyngeal Phase Impaired Pharyngeal- Pudding Teaspoon -- Pharyngeal -- Pharyngeal- Pudding Cup -- Pharyngeal -- Pharyngeal- Honey Teaspoon -- Pharyngeal -- Pharyngeal- Honey Cup Delayed swallow initiation-pyriform sinuses;Reduced epiglottic inversion;Reduced anterior laryngeal mobility;Reduced tongue base retraction;Penetration/Aspiration before swallow;Penetration/Apiration after swallow;Trace aspiration;Compensatory strategies attempted (with notebox) Pharyngeal Material enters airway, passes BELOW cords then ejected out;Material enters airway, remains ABOVE vocal cords and not ejected out;Material enters airway, CONTACTS cords and not ejected out Pharyngeal- Nectar  Teaspoon -- Pharyngeal Material enters airway, passes BELOW cords then ejected out;Material enters airway, remains ABOVE vocal cords and not ejected out;Material enters airway, CONTACTS cords and not ejected out Pharyngeal- Nectar Cup Delayed swallow initiation-pyriform sinuses;Reduced epiglottic inversion;Reduced anterior laryngeal mobility;Reduced tongue base retraction;Penetration/Aspiration before swallow;Penetration/Apiration after swallow;Trace aspiration Pharyngeal Material enters airway, passes BELOW cords then ejected out;Material enters airway, remains ABOVE vocal cords and not ejected out;Material enters airway, CONTACTS cords and not ejected out Pharyngeal- Nectar Straw -- Pharyngeal -- Pharyngeal- Thin Teaspoon -- Pharyngeal -- Pharyngeal- Thin Cup Delayed swallow initiation-pyriform sinuses;Reduced epiglottic inversion;Reduced anterior laryngeal mobility;Reduced tongue base retraction;Penetration/Aspiration before swallow;Penetration/Apiration after swallow;Trace aspiration Pharyngeal Material enters airway, passes BELOW cords then ejected out;Material enters airway, passes BELOW cords and not ejected out despite cough attempt by patient Pharyngeal- Thin Straw -- Pharyngeal -- Pharyngeal- Puree Delayed swallow initiation-pyriform sinuses;Reduced epiglottic inversion;Reduced anterior laryngeal mobility;Reduced tongue base retraction Pharyngeal -- Pharyngeal- Mechanical Soft -- Pharyngeal -- Pharyngeal- Regular -- Pharyngeal -- Pharyngeal- Multi-consistency -- Pharyngeal -- Pharyngeal- Pill -- Pharyngeal -- Pharyngeal Comment --  No flowsheet data found. Shanika I. Hardin Negus, [place], [place] Office number [phone] Pager [phone] Horton Marshall 11/24/2019, 4:11 PM              CUP PACEART REMOTE DEVICE CHECK  Result Date: 11/16/2019 Unscheduled CRTD transmission. Normal device function.  VP 98%.  Estimated longevity 9 months. Optivol elevated, night HR increased 2 V  sensing episodes - NSVT, in and out of VT zone.  [place]   Labs:  CBC: Recent Labs    11/22/19 1032 11/23/19 0714 11/24/19 0650 11/25/19 0426  WBC 8.1 8.4 10.2 7.2  HGB 13.9 11.5* 11.6* 11.0*  HCT 45.6 37.8* 38.0* 35.9*  PLT 244 200 146* 201    COAGS: Recent Labs    11/21/19 1008  INR 1.3*    BMP: Recent Labs    11/22/19 1032 11/23/19 0714 11/24/19 0650 11/25/19 0426  NA 146* 145 144 143  K 4.4 4.3 4.6 4.7  CL 109 112* 112*  111  CO2 '28 26 25 26  '$ GLUCOSE 160* 196* 167* 165*  BUN 30* 27* 22 21  CALCIUM 9.6 9.1 8.9 9.1  CREATININE 1.83* 1.65* 1.57* 1.61*  GFRNONAA 33* 37* 39* 38*  GFRAA 38* 43* 46* 44*    LIVER FUNCTION TESTS: Recent Labs    05/20/19 1955 11/21/19 1008  BILITOT 0.8 0.6  AST 32 14*  ALT 14 13  ALKPHOS 55 65  PROT 7.7 6.6  ALBUMIN 3.7 2.9*    TUMOR MARKERS: No results for input(s): AFPTM, CEA, CA199, CHROMGRNA in the last 8760 hours.  Assessment and Plan:  Laryngeal cancer S/p radiation with new laryngeal edema Dysphagia; wt loss Protein calorie malnutrition High aspiration risk per Swallow study Scheduled now for percutaneous gastric tube placement in IR Plan for procedure 3/4 per IR schedule Risks and benefits image guided gastrostomy tube placement was discussed with the patient including, but not limited to the need for a barium enema during the procedure, bleeding, infection, peritonitis and/or damage to adjacent structures.  All of the patient's questions were answered, patient is agreeable to proceed. Consent signed and in chart.   Thank you for this interesting consult.  I greatly enjoyed meeting [name] and look forward to participating in their care.  A copy of this report was sent to the requesting provider on this date.  Electronically Signed: Lavonia Drafts, PA-C 11/25/2019, 1:56 PM   I spent a total of 40 Minutes    in face to face in clinical consultation, greater than 50% of which was  counseling/coordinating care for percutaneous gastric tube placement

## 2019-11-25 NOTE — Progress Notes (Signed)
Physical Therapy Treatment Patient Details Name: [name] MRN: [id number] DOB: [birth year] Today's Date: 11/25/2019    History of Present Illness [name] is an 86 year old male with a PMHx of laryngeal cancer s/p radiation (most recent session 11/19/2019), HTN, HLD, DMII, CHF s/p ICD presenting with dysphagia, nausea/vomiting and weight loss. EGD findings without any esophageal abnormalities but possible oropharynx etiology of symptoms. ENT consulted and patient had transnasal laryngoscopy yesterday significant for right hemilarynx immobility and marked posterior laryngeal edema secondary to radiation.    PT Comments    Patient received in bed, very pleasant and cooperative with PT; had some communication difficulties given his diagnosis and situation, however able to make needs known to PT. See below for mobility/assist levels. Did very well this session with RW, generally able to mobilize and perform self care in bathroom with S and assist for lines but fatigued at EOS. Balance is improving- able to perform selfcare with S and no UE support, although with wide BOS. He declines up to chair this afternoon and was left in bed with all needs met, bed alarm active. Progressing well.     Follow Up Recommendations  Home health PT     Equipment Recommendations  None recommended by PT    Recommendations for Other Services       Precautions / Restrictions Precautions Precautions: Fall Restrictions Weight Bearing Restrictions: No    Mobility  Bed Mobility Overal bed mobility: Needs Assistance Bed Mobility: Supine to Sit;Sit to Supine     Supine to sit: Min assist Sit to supine: Supervision   General bed mobility comments: pt seeking HHA to assist with trunk elevation, only needed S for return to bed as well as line management  Transfers Overall transfer level: Needs assistance Equipment used: Rolling walker (2 wheeled) Transfers: Sit to/from Stand Sit to Stand:  Supervision         General transfer comment: for lines and safety  Ambulation/Gait Ambulation/Gait assistance: Supervision Gait Distance (Feet): 200 Feet Assistive device: Rolling walker (2 wheeled) Gait Pattern/deviations: Step-through pattern Gait velocity: decreased   General Gait Details: S with RW, gait pattern much improved overall but still a bit impulsive especially when it comes to gait speed   Stairs             Wheelchair Mobility    Modified Rankin (Stroke Patients Only)       Balance Overall balance assessment: Mild deficits observed, not formally tested                                          Cognition Arousal/Alertness: Awake/alert Behavior During Therapy: WFL for tasks assessed/performed                                   General Comments: HOH and with communication difficulties, also a bit impulsive during session but overall mostly appropraite for functional tasks and command following      Exercises      General Comments General comments (skin integrity, edema, etc.): able to stand and wipe rear without UE support and only S for safety/lines      Pertinent Vitals/Pain Pain Assessment: No/denies pain Pain Score: 0-No pain Faces Pain Scale: No hurt Pain Intervention(s): Limited activity within patient's tolerance;Monitored during session    Home Living  Prior Function            PT Goals (current goals can now be found in the care plan section) Acute Rehab PT Goals Patient Stated Goal: did not state PT Goal Formulation: With patient Time For Goal Achievement: 12/07/19 Potential to Achieve Goals: Good Progress towards PT goals: Progressing toward goals    Frequency    Min 3X/week      PT Plan Current plan remains appropriate    Co-evaluation              AM-PAC PT "6 Clicks" Mobility   Outcome Measure  Help needed turning from your back to your  side while in a flat bed without using bedrails?: None Help needed moving from lying on your back to sitting on the side of a flat bed without using bedrails?: A Little Help needed moving to and from a bed to a chair (including a wheelchair)?: None Help needed standing up from a chair using your arms (e.g., wheelchair or bedside chair)?: None Help needed to walk in hospital room?: None Help needed climbing 3-5 steps with a railing? : A Little 6 Click Score: 22    End of Session   Activity Tolerance: Patient tolerated treatment well Patient left: in bed;with call bell/phone within reach;with bed alarm set   PT Visit Diagnosis: Unsteadiness on feet (R26.81);Muscle weakness (generalized) (M62.81)     Time: 1500-1530 PT Time Calculation (min) (ACUTE ONLY): 30 min  Charges:  $Gait Training: 8-22 mins $Therapeutic Activity: 8-22 mins                     Windell Norfolk, DPT, PN1   Supplemental Physical Therapist [place]    Pager [phone] Acute Rehab Office [phone]

## 2019-11-25 NOTE — Plan of Care (Signed)
  Problem: Nutrition: Goal: Adequate nutrition will be maintained Outcome: Not Progressing   

## 2019-11-25 NOTE — Progress Notes (Signed)
   NAME:  TAHA DIMOND, MRN:  [id number], DOB:  25-Jan-1931, LOS: 5 ADMISSION DATE:  11/20/2019   Subjective/Interm history  Barium swallow revealed aspiration. ST recommending feeding method other than PO Discussed barium swallow results and necessity for PEG tube which he is agreeable to. No complaints this morning. VSS  Objective   Blood pressure 100/70, pulse 80, temperature 98.6 F (37 C), temperature source Oral, resp. rate 18, height 5\' 11"  (1.803 m), weight 53 kg, SpO2 98 %.     Intake/Output Summary (Last 24 hours) at 11/25/2019 0552 Last data filed at 11/25/2019 0300 Gross per 24 hour  Intake 1637.57 ml  Output 1150 ml  Net 487.57 ml   Filed Weights   11/22/19 2049 11/23/19 2041 11/24/19 0500  Weight: 53 kg 53 kg 53 kg    Examination: GENERAL: chronically ill appearing.in no acute distress HEENT: hoarse voice CARDIAC: heart RRR. No peripheral edema.  PULMONARY: acyanotic. Diffuse rhonchi that are somewhat better than yesterday SKIN: epidermal sluffing on anterior neck at site of radiation which is new from yesterday. No drainage or swelling.  Labs    CBC Latest Ref Rng & Units 11/24/2019 11/23/2019 11/22/2019  WBC 4.0 - 10.5 K/uL 10.2 8.4 8.1  Hemoglobin 13.0 - 17.0 g/dL 11.6(L) 11.5(L) 13.9  Hematocrit 39.0 - 52.0 % 38.0(L) 37.8(L) 45.6  Platelets 150 - 400 K/uL 146(L) 200 244   BMP Latest Ref Rng & Units 11/24/2019 11/23/2019 11/22/2019  Glucose 70 - 99 mg/dL 167(H) 196(H) 160(H)  BUN 8 - 23 mg/dL 22 27(H) 30(H)  Creatinine 0.61 - 1.24 mg/dL 1.57(H) 1.65(H) 1.83(H)  BUN/Creat Ratio 10 - 24 - - -  Sodium 135 - 145 mmol/L 144 145 146(H)  Potassium 3.5 - 5.1 mmol/L 4.6 4.3 4.4  Chloride 98 - 111 mmol/L 112(H) 112(H) 109  CO2 22 - 32 mmol/L 25 26 28   Calcium 8.9 - 10.3 mg/dL 8.9 9.1 9.6    Summary  86 yo male with recent history of laryngeal cancer s/p radiation treatment on 2/24 who presented with dysphagia, n/v, weight loss and found to have post radiation  laryngeal edema.  Assessment & Plan:  Principal Problem:   Malignant neoplasm of glottis (HCC) Active Problems:   Type 2 diabetes mellitus with peripheral vascular disease (HCC)   Chronic systolic congestive heart failure (HCC)   [name] (chronic kidney disease) stage 3, GFR 30-59 ml/min (HCC)   Weight loss   Pharyngeal dysphagia   Protein-calorie malnutrition, severe   Post radiation therapy laryngeal edema with dysphagia and severe protein calorie malnutrition Post radiation anterior neck skin breakdown. New from yesterday but does not appear infected. Dressing intact. ST recommending G tube due to aspiration on barium swallow Renal function stable from yesterday and suspect acute component is from dehydration. Plan IR consulted for PEG tube placement Medrol dose pack to assist with laryngeal edema NPO. Will start D5 for hydration and to maintain glucose levels. Will be able to be stopped once G tube is useable. Continue lidocaine oral solution prn Dronabinol for nausea  T2DM. Glucoses stable. Continue SSI. HFrEF (EF 30-35%). Continue to hold home dose diuretic for AKI. HTN. Continue carvedilol.   Best practice:  CODE STATUS: Full Diet: NPO DVT for prophylaxis: hold preopertively Dispo: Pending G tube placement. Home health PT/OT on discharge.  Mitzi Hansen, MD INTERNAL MEDICINE RESIDENT PGY-1 PAGER #: [phone] 11/25/19 5:52 AM

## 2019-11-25 NOTE — Progress Notes (Addendum)
Patient's CBG 234, with no insulin coverage for HS. Patient has Dextrose 5% infusing at 63mls/hr. RN notified on call MD, Madilyn Fireman. Awaiting call back or new orders.  Ermalinda Memos, RN

## 2019-11-26 LAB — GLUCOSE, CAPILLARY
Glucose-Capillary: 135 mg/dL — ABNORMAL HIGH (ref 70–99)
Glucose-Capillary: 177 mg/dL — ABNORMAL HIGH (ref 70–99)
Glucose-Capillary: 178 mg/dL — ABNORMAL HIGH (ref 70–99)
Glucose-Capillary: 210 mg/dL — ABNORMAL HIGH (ref 70–99)
Glucose-Capillary: 233 mg/dL — ABNORMAL HIGH (ref 70–99)

## 2019-11-26 LAB — BASIC METABOLIC PANEL
Anion gap: 7 (ref 5–15)
BUN: 23 mg/dL (ref 8–23)
CO2: 25 mmol/L (ref 22–32)
Calcium: 9 mg/dL (ref 8.9–10.3)
Chloride: 107 mmol/L (ref 98–111)
Creatinine, Ser: 1.61 mg/dL — ABNORMAL HIGH (ref 0.61–1.24)
GFR calc Af Amer: 44 mL/min — ABNORMAL LOW (ref >60)
GFR calc non Af Amer: 38 mL/min — ABNORMAL LOW (ref >60)
Glucose, Bld: 215 mg/dL — ABNORMAL HIGH (ref 70–99)
Potassium: 4.4 mmol/L (ref 3.5–5.1)
Sodium: 139 mmol/L (ref 135–145)

## 2019-11-26 MED ORDER — SODIUM CHLORIDE 0.9 % IV SOLN: INTRAVENOUS | Status: DC

## 2019-11-26 MED ORDER — INSULIN ASPART 100 UNIT/ML ~~LOC~~ SOLN
0.0000 [IU] | Freq: Three times a day (TID) | SUBCUTANEOUS | Status: DC
  Administered 2019-11-26: 5 [IU] via SUBCUTANEOUS
  Administered 2019-11-26 (×2): 3 [IU] via SUBCUTANEOUS
  Administered 2019-11-28: 8 [IU] via SUBCUTANEOUS
  Administered 2019-11-28: 3 [IU] via SUBCUTANEOUS

## 2019-11-26 MED ORDER — INSULIN ASPART 100 UNIT/ML ~~LOC~~ SOLN: 0.0000 [IU] | Freq: Four times a day (QID) | SUBCUTANEOUS | Status: DC

## 2019-11-26 NOTE — Progress Notes (Signed)
Inpatient Diabetes Program Recommendations  AACE/ADA: New Consensus Statement on Inpatient Glycemic Control (2015)  Target Ranges:  Prepandial:   less than 140 mg/dL      Peak postprandial:   less than 180 mg/dL (1-2 hours)      Critically ill patients:  140 - 180 mg/dL   Lab Results  Component Value Date   GLUCAP 177 (H) 11/26/2019   HGBA1C 7.7 (H) 11/20/2019    Review of Glycemic Control Results for [name], [name] (MRN [id number]) as of 11/26/2019 08:45  Ref. Range 11/25/2019 07:27 11/25/2019 14:44 11/25/2019 16:25 11/25/2019 21:53 11/26/2019 00:07 11/26/2019 07:00  Glucose-Capillary Latest Ref Range: 70 - 99 mg/dL 141 (H) 260 (H) 205 (H) 234 (H) 233 (H) 177 (H)   Diabetes history: DM 2 Outpatient Diabetes medications: Lantus 7 units Daily Current orders for Inpatient glycemic control:  Novolog 0-15 units tid  A1c 7.7% on 2/25  Inpatient Diabetes Program Recommendations:    Fasting glucose 177 this am. G Tube placed yesterday.  If pt begins Tube Feed, change Novolog Correction scale to Q4 hour coverage.   May also need Tube Feed Coverage Q4 hours if glucose trends are elevated after Q4 hour coverage added.  Thanks,  Tama Headings RN, MSN, BC-ADM Inpatient Diabetes Coordinator Team Pager [phone] (8a-5p)

## 2019-11-26 NOTE — Progress Notes (Signed)
Occupational Therapy Treatment Patient Details Name: [name] MRN: [id number] DOB: December 26, 1930 Today's Date: 11/26/2019    History of present illness [name] is an 86 year old male with a PMHx of laryngeal cancer s/p radiation (most recent session 11/19/2019), HTN, HLD, DMII, CHF s/p ICD presenting with dysphagia, nausea/vomiting and weight loss. EGD findings without any esophageal abnormalities but possible oropharynx etiology of symptoms. ENT consulted and patient had transnasal laryngoscopy yesterday significant for right hemilarynx immobility and marked posterior laryngeal edema secondary to radiation.   OT comments  Pt making progress with functional goals. Sessio focused on bed mobility, functional ADL mobility using RW, grooming/hygiene standing at sink, UB and LB ADLs. Pt very pleasant and cooperative. OT will continue to follow acutely  Follow Up Recommendations  Home health OT;Supervision/Assistance - 24 hour    Equipment Recommendations  None recommended by OT    Recommendations for Other Services      Precautions / Restrictions Precautions Precautions: Fall Restrictions Weight Bearing Restrictions: No       Mobility Bed Mobility Overal bed mobility: Needs Assistance Bed Mobility: Supine to Sit;Sit to Supine     Supine to sit: Min guard Sit to supine: Min guard      Transfers Overall transfer level: Needs assistance Equipment used: Rolling walker (2 wheeled) Transfers: Sit to/from Stand Sit to Stand: Supervision         General transfer comment: for lines and safety    Balance Overall balance assessment: Mild deficits observed, not formally tested Sitting-balance support: Feet supported Sitting balance-Leahy Scale: Good     Standing balance support: During functional activity;Single extremity supported Standing balance-Leahy Scale: Fair                             ADL either performed or assessed with clinical judgement    ADL Overall ADL's : Needs assistance/impaired     Grooming: Wash/dry hands;Wash/dry face;Min guard;Standing   Upper Body Bathing: Set up;Supervision/ safety;Sitting Upper Body Bathing Details (indicate cue type and reason): simulated Lower Body Bathing: Minimal assistance;Min guard;Sit to/from stand;Cueing for safety       Lower Body Dressing: Minimal assistance;Sit to/from stand;Cueing for safety   Toilet Transfer: Min guard;Supervision/safety;Ambulation;RW;Grab bars   Toileting- Clothing Manipulation and Hygiene: Min guard;Sit to/from stand       Functional mobility during ADLs: Min guard;Supervision/safety;Cueing for safety;Rolling walker       Vision Patient Visual Report: No change from baseline     Perception     Praxis      Cognition Arousal/Alertness: Awake/alert Behavior During Therapy: WFL for tasks assessed/performed                                   General Comments: mildly impulsive, follows commands        Exercises     Shoulder Instructions       General Comments      Pertinent Vitals/ Pain       Pain Assessment: No/denies pain Pain Score: 0-No pain Pain Intervention(s): Monitored during session;Repositioned  Home Living                                          Prior Functioning/Environment  Frequency  Min 2X/week        Progress Toward Goals  OT Goals(current goals can now be found in the care plan section)  Progress towards OT goals: Progressing toward goals     Plan Discharge plan remains appropriate    Co-evaluation                 AM-PAC OT "6 Clicks" Daily Activity     Outcome Measure   Help from another person eating meals?: None Help from another person taking care of personal grooming?: None Help from another person toileting, which includes using toliet, bedpan, or urinal?: A Little Help from another person bathing (including washing, rinsing,  drying)?: A Little Help from another person to put on and taking off regular upper body clothing?: None Help from another person to put on and taking off regular lower body clothing?: A Little 6 Click Score: 21    End of Session Equipment Utilized During Treatment: Gait belt;Rolling walker  OT Visit Diagnosis: Unsteadiness on feet (R26.81);Muscle weakness (generalized) (M62.81)   Activity Tolerance Patient tolerated treatment well   Patient Left with call bell/phone within reach;in bed;with bed alarm set   Nurse Communication          Time: 2947-6546 OT Time Calculation (min): 26 min  Charges: OT General Charges $OT Visit: 1 Visit OT Treatments $Self Care/Home Management : 8-22 mins $Therapeutic Activity: 8-22 mins     Britt Bottom 11/26/2019, 2:03 PM

## 2019-11-26 NOTE — Progress Notes (Signed)
   NAME:  [name], MRN:  [id number], DOB:  02-20-1931, LOS: 6 ADMISSION DATE:  11/20/2019   Subjective/Interm history  No overnight events VSS Discussed reasoning for G tube placement. Objective   Blood pressure 114/66, pulse 64, temperature 98.8 F (37.1 C), temperature source Oral, resp. rate 19, height 5\' 11"  (1.803 m), weight 53 kg, SpO2 95 %.     Intake/Output Summary (Last 24 hours) at 11/26/2019 0539 Last data filed at 11/26/2019 0200 Gross per 24 hour  Intake 481.25 ml  Output 600 ml  Net -118.75 ml   Filed Weights   11/22/19 2049 11/23/19 2041 11/24/19 0500  Weight: 53 kg 53 kg 53 kg    Examination: GENERAL: chronically ill appearing.in no acute distress HEENT: hoarse voice CARDIAC: heart RRR.  PULMONARY: diffuse rhonchi throughout SKIN: intact dressing to anterior neck.  Labs    CBC Latest Ref Rng & Units 11/25/2019 11/24/2019 11/23/2019  WBC 4.0 - 10.5 K/uL 7.2 10.2 8.4  Hemoglobin 13.0 - 17.0 g/dL 11.0(L) 11.6(L) 11.5(L)  Hematocrit 39.0 - 52.0 % 35.9(L) 38.0(L) 37.8(L)  Platelets 150 - 400 K/uL 201 146(L) 200   BMP Latest Ref Rng & Units 11/25/2019 11/24/2019 11/23/2019  Glucose 70 - 99 mg/dL 165(H) 167(H) 196(H)  BUN 8 - 23 mg/dL 21 22 27(H)  Creatinine 0.61 - 1.24 mg/dL 1.61(H) 1.57(H) 1.65(H)  BUN/Creat Ratio 10 - 24 - - -  Sodium 135 - 145 mmol/L 143 144 145  Potassium 3.5 - 5.1 mmol/L 4.7 4.6 4.3  Chloride 98 - 111 mmol/L 111 112(H) 112(H)  CO2 22 - 32 mmol/L 26 25 26   Calcium 8.9 - 10.3 mg/dL 9.1 8.9 9.1    Summary  86 yo male with recent history of laryngeal cancer s/p radiation treatment on 2/24 who presented with dysphagia, n/v, weight loss and found to have post radiation laryngeal edema.  Assessment & Plan:  Principal Problem:   Malignant neoplasm of glottis (HCC) Active Problems:   Type 2 diabetes mellitus with peripheral vascular disease (HCC)   Chronic systolic congestive heart failure (HCC)   [name] (chronic kidney disease) stage 3, GFR  30-59 ml/min (HCC)   Weight loss   Pharyngeal dysphagia   Protein-calorie malnutrition, severe   Post radiation therapy laryngeal edema with dysphagia and severe protein calorie malnutrition Post radiation anterior neck skin breakdown.  Renal function stable from yesterday and suspect acute component is from dehydration. Plan IR to place G tube 3/4 Medrol dose pack to assist with laryngeal edema NS @75mL /hr Continue lidocaine oral solution prn Dronabinol for nausea  T2DM. Blood sugars elevated overnight. Will switch from D5 to NS. Continue SSI HFrEF (EF 30-35%). Continue to hold home dose diuretic for AKI. HTN. Blood pressures stable. Continue carvedilol.   Best practice:  CODE STATUS: Full Diet: Dysphasia 1 DVT for prophylaxis: SCDs Dispo: Pending G tube placement. Home health PT/OT on discharge.  Mitzi Hansen, MD INTERNAL MEDICINE RESIDENT PGY-1 PAGER #: [phone] 11/26/19 5:39 AM

## 2019-11-27 ENCOUNTER — Ambulatory Visit: Payer: Medicare HMO

## 2019-11-27 ENCOUNTER — Inpatient Hospital Stay (HOSPITAL_COMMUNITY): Payer: Medicare HMO

## 2019-11-27 HISTORY — PX: IR GASTROSTOMY TUBE MOD SED: IMG625

## 2019-11-27 LAB — CBC
HCT: 35.4 % — ABNORMAL LOW (ref 39.0–52.0)
Hemoglobin: 11 g/dL — ABNORMAL LOW (ref 13.0–17.0)
MCH: 29.9 pg (ref 26.0–34.0)
MCHC: 31.1 g/dL (ref 30.0–36.0)
MCV: 96.2 fL (ref 80.0–100.0)
Platelets: 138 10*3/uL — ABNORMAL LOW (ref 150–400)
RBC: 3.68 MIL/uL — ABNORMAL LOW (ref 4.22–5.81)
RDW: 13.9 % (ref 11.5–15.5)
WBC: 7.1 10*3/uL (ref 4.0–10.5)
nRBC: 0 % (ref 0.0–0.2)

## 2019-11-27 LAB — BASIC METABOLIC PANEL
Anion gap: 10 (ref 5–15)
BUN: 21 mg/dL (ref 8–23)
CO2: 21 mmol/L — ABNORMAL LOW (ref 22–32)
Calcium: 8.7 mg/dL — ABNORMAL LOW (ref 8.9–10.3)
Chloride: 110 mmol/L (ref 98–111)
Creatinine, Ser: 1.42 mg/dL — ABNORMAL HIGH (ref 0.61–1.24)
GFR calc Af Amer: 51 mL/min — ABNORMAL LOW (ref >60)
GFR calc non Af Amer: 44 mL/min — ABNORMAL LOW (ref >60)
Glucose, Bld: 143 mg/dL — ABNORMAL HIGH (ref 70–99)
Potassium: 4.7 mmol/L (ref 3.5–5.1)
Sodium: 141 mmol/L (ref 135–145)

## 2019-11-27 LAB — GLUCOSE, CAPILLARY
Glucose-Capillary: 138 mg/dL — ABNORMAL HIGH (ref 70–99)
Glucose-Capillary: 121 mg/dL — ABNORMAL HIGH (ref 70–99)
Glucose-Capillary: 120 mg/dL — ABNORMAL HIGH (ref 70–99)
Glucose-Capillary: 113 mg/dL — ABNORMAL HIGH (ref 70–99)

## 2019-11-27 LAB — PHOSPHORUS: Phosphorus: 2.6 mg/dL (ref 2.5–4.6)

## 2019-11-27 LAB — MAGNESIUM: Magnesium: 1.7 mg/dL (ref 1.7–2.4)

## 2019-11-27 MED ORDER — PRO-STAT SUGAR FREE PO LIQD
30.0000 mL | Freq: Every day | ORAL | Status: DC
  Administered 2019-11-28 – 2019-11-30 (×3): 30 mL
  Filled 2019-11-27 (×3): qty 30

## 2019-11-27 MED ORDER — LIDOCAINE HCL 1 % IJ SOLN
INTRAMUSCULAR | Status: AC
  Filled 2019-11-27: qty 20

## 2019-11-27 MED ORDER — IOHEXOL 300 MG/ML  SOLN
50.0000 mL | Freq: Once | INTRAMUSCULAR | Status: AC | PRN
  Administered 2019-11-27: 15 mL

## 2019-11-27 MED ORDER — LIDOCAINE HCL 1 % IJ SOLN
INTRAMUSCULAR | Status: AC | PRN
  Administered 2019-11-27: 10 mL

## 2019-11-27 MED ORDER — SODIUM CHLORIDE 0.9 % IV SOLN: INTRAVENOUS | Status: DC

## 2019-11-27 MED ORDER — FENTANYL CITRATE (PF) 100 MCG/2ML IJ SOLN
INTRAMUSCULAR | Status: AC | PRN
  Administered 2019-11-27: 50 ug via INTRAVENOUS

## 2019-11-27 MED ORDER — MIDAZOLAM HCL 2 MG/2ML IJ SOLN
INTRAMUSCULAR | Status: AC
  Filled 2019-11-27: qty 2

## 2019-11-27 MED ORDER — MIDAZOLAM HCL 2 MG/2ML IJ SOLN
INTRAMUSCULAR | Status: AC | PRN
  Administered 2019-11-27: 1 mg via INTRAVENOUS

## 2019-11-27 MED ORDER — OSMOLITE 1.5 CAL PO LIQD
150.0000 mL | Freq: Every day | ORAL | Status: DC
  Administered 2019-11-28: 237 mL
  Administered 2019-11-28 (×2): 150 mL
  Administered 2019-11-28 – 2019-11-30 (×10): 237 mL
  Filled 2019-11-27 (×16): qty 237

## 2019-11-27 MED ORDER — BACITRACIN-NEOMYCIN-POLYMYXIN 400-5-5000 EX OINT
1.0000 "application " | TOPICAL_OINTMENT | Freq: Every day | CUTANEOUS | Status: DC
  Administered 2019-11-27 – 2019-11-30 (×4): 1 via TOPICAL
  Filled 2019-11-27 (×2): qty 1

## 2019-11-27 MED ORDER — FENTANYL CITRATE (PF) 100 MCG/2ML IJ SOLN
INTRAMUSCULAR | Status: AC
  Filled 2019-11-27: qty 2

## 2019-11-27 MED ORDER — CEFAZOLIN SODIUM-DEXTROSE 2-4 GM/100ML-% IV SOLN
INTRAVENOUS | Status: AC
  Filled 2019-11-27: qty 100

## 2019-11-27 NOTE — Progress Notes (Signed)
SLP Cancellation Note  Patient Details Name: RIGGINS CISEK MRN: [id number] DOB: 05-Oct-1930   Cancelled treatment:       Reason Eval/Treat Not Completed: Patient at procedure or test/unavailable Patient NPO; Gtube placement today. ST will continue efforts.  Marina Goodell, M.Ed., CCC-SLP Speech Therapy Acute Rehabilitation [phone]: Acute Rehab office [phone] - pager    Marina Goodell 11/27/2019, 1:59 PM

## 2019-11-27 NOTE — Sedation Documentation (Addendum)
Patient placed on non-rebreather due to what appeared to be desaturation on oxygen due to thick secretions in the back of the throat.  Patient was applied jaw thrust with Dr. [name] and suctioned to remove secretions.

## 2019-11-27 NOTE — Procedures (Signed)
Interventional Radiology Procedure Note  Procedure: Placement of percutaneous 20F pull-through gastrostomy tube. Complications: None Recommendations: - NPO except for sips and chips remainder of today and overnight - Maintain G-tube to LWS until tomorrow morning  - May advance diet as tolerated and begin using tube tomorrow morning  Signed,   Brenley Priore S. Karmello Abercrombie, DO   

## 2019-11-27 NOTE — Progress Notes (Signed)
   NAME:  SALVATOR SEPPALA, MRN:  [id number], DOB:  1931/09/14, LOS: 7 ADMISSION DATE:  11/20/2019   Subjective/Interm history  No overnight events VSS drowsy post opertively. Denies pain  Objective   Blood pressure 139/61, pulse 64, temperature 98.4 F (36.9 C), temperature source Oral, resp. rate 18, height 5\' 11"  (1.803 m), weight 53 kg, SpO2 100 %.     Intake/Output Summary (Last 24 hours) at 11/27/2019 0530 Last data filed at 11/27/2019 0500 Gross per 24 hour  Intake 1601.42 ml  Output 300 ml  Net 1301.42 ml   Filed Weights   11/22/19 2049 11/23/19 2041 11/24/19 0500  Weight: 53 kg 53 kg 53 kg    Examination: GENERAL: chronically ill appearing.in no acute distress HEENT: hoarse voice CARDIAC: heart RRR.  PULMONARY: diffuse rhonchi throughout SKIN: intact dressing to anterior neck. No drainage or surrounding edema  Labs    CBC Latest Ref Rng & Units 11/27/2019 11/25/2019 11/24/2019  WBC 4.0 - 10.5 K/uL 7.1 7.2 10.2  Hemoglobin 13.0 - 17.0 g/dL 11.0(L) 11.0(L) 11.6(L)  Hematocrit 39.0 - 52.0 % 35.4(L) 35.9(L) 38.0(L)  Platelets 150 - 400 K/uL 138(L) 201 146(L)   BMP Latest Ref Rng & Units 11/27/2019 11/26/2019 11/25/2019  Glucose 70 - 99 mg/dL 143(H) 215(H) 165(H)  BUN 8 - 23 mg/dL 21 23 21   Creatinine 0.61 - 1.24 mg/dL 1.42(H) 1.61(H) 1.61(H)  BUN/Creat Ratio 10 - 24 - - -  Sodium 135 - 145 mmol/L 141 139 143  Potassium 3.5 - 5.1 mmol/L 4.7 4.4 4.7  Chloride 98 - 111 mmol/L 110 107 111  CO2 22 - 32 mmol/L 21(L) 25 26  Calcium 8.9 - 10.3 mg/dL 8.7(L) 9.0 9.1    Summary  86 yo male with recent history of laryngeal cancer s/p radiation treatment on 2/24 who presented with dysphagia, n/v, weight loss and found to have post radiation laryngeal edema.  Assessment & Plan:  Principal Problem:   Malignant neoplasm of glottis (HCC) Active Problems:   Type 2 diabetes mellitus with peripheral vascular disease (HCC)   Chronic systolic congestive heart failure (HCC)   [name] (chronic  kidney disease) stage 3, GFR 30-59 ml/min (HCC)   Weight loss   Pharyngeal dysphagia   Protein-calorie malnutrition, severe   Post radiation therapy laryngeal edema with dysphagia and severe protein calorie malnutrition Post radiation anterior neck skin breakdown.  Plan G tube placement today Nutrition consulted for tube feeds. Medrol dose pack to assist with laryngeal edema D/c IVF Continue lidocaine oral solution prn Dronabinol for nausea  T2DM. Blood sugars stable. Continue SSI HFrEF (EF 30-35%). Continue to hold home dose diuretic for AKI.  HTN. Blood pressures stable. Continue carvedilol.  Best practice:  CODE STATUS: Full Diet: Dysphasia 1 DVT for prophylaxis: SCDs Dispo: Pending G tube placement. Discharge suspected in 1-2d.  Home health PT/OT on discharge.  Mitzi Hansen, MD INTERNAL MEDICINE RESIDENT PGY-1 PAGER #: [phone] 11/27/19 5:30 AM

## 2019-11-27 NOTE — Plan of Care (Signed)
  Problem: Education: Goal: Knowledge of General Education information will improve Description: Including pain rating scale, medication(s)/side effects and non-pharmacologic comfort measures Outcome: Completed/Met   Problem: Coping: Goal: Level of anxiety will decrease Outcome: Completed/Met   Problem: Pain Managment: Goal: General experience of comfort will improve Outcome: Completed/Met

## 2019-11-27 NOTE — Progress Notes (Addendum)
Nutrition Follow-up  DOCUMENTATION CODES:   Underweight, Severe malnutrition in context of chronic illness  INTERVENTION:   Monitor magnesium, potassium, and phosphorus daily for at least 3 days, MD to replete as needed, as pt is at risk for refeeding syndrome.   Plan bolus feedings tomorrow:  Start with 150 ml Osmolite 1.5 five times daily   -Increase bolus feedings to 237 ml (1 carton) five times daily on 3/6 -Add 30 ml Prostat daily  -Add 100 ml free water before and after each feeding  At goal TF provides: 1875 kcal, 90 grams protein, and 905 ml free water (1905 ml with flushes).   NUTRITION DIAGNOSIS:   Severe Malnutrition related to chronic illness, cancer and cancer related treatments as evidenced by severe fat depletion, severe muscle depletion, energy intake < or equal to 75% for > or equal to 1 month, percent weight loss.  Ongoing  GOAL:   Patient will meet greater than or equal to 90% of their needs  Progressing  MONITOR:   PO intake, Supplement acceptance, Weight trends, Diet advancement, Labs, I & O's  REASON FOR ASSESSMENT:   Consult Assessment of nutrition requirement/status  ASSESSMENT:  RD working remotely.  Patient with PMH significant for laryngeal cancer s/p radiation (last treatment 2/25), HTN, HLD, DM, CHF s/p ICD, and PVD. Presents this admission with failure to thrive.   2/27- s/p laryngoscopy revealed posterior laryngeal edema 3/4- s/p PEG  Pt not eating much. Meal completions charted as 0-25% for his last six meals. Discussed home tube feeding plan with pt. Would like to start continuous to establish toleration but pt is likely to d/c tomorrow. Will being to titrate bolus feedings tomorrow when PEG can be used.   Admission weight: 64.2 kg  Current weight: 53 kg   I/O: +6,589 ml since admit UOP: 300 ml x 24 hrs   Medications: SS novolog, miralax, senokot  Labs: CBG 121-210  Diet Order:   Diet Order            Diet NPO time  specified  Diet effective midnight              EDUCATION NEEDS:   Education needs have been addressed  Skin:  Skin Assessment: Reviewed RN Assessment  Last BM:  3/4  Height:   Ht Readings from Last 1 Encounters:  11/20/19 5\' 11"  (1.803 m)    Weight:   Wt Readings from Last 1 Encounters:  11/24/19 53 kg    Ideal Body Weight:  78.2 kg  BMI:  Body mass index is 16.3 kg/m.  Estimated Nutritional Needs:   Kcal:  1600-1800 kcal  Protein:  80-95 grams  Fluid:  >/= 1.6 L/day  Mariana Single RD, LDN Clinical Nutrition Pager listed in [place]

## 2019-11-28 DIAGNOSIS — Z931 Gastrostomy status: Secondary | ICD-10-CM

## 2019-11-28 LAB — GLUCOSE, CAPILLARY
Glucose-Capillary: 96 mg/dL (ref 70–99)
Glucose-Capillary: 113 mg/dL — ABNORMAL HIGH (ref 70–99)
Glucose-Capillary: 191 mg/dL — ABNORMAL HIGH (ref 70–99)
Glucose-Capillary: 286 mg/dL — ABNORMAL HIGH (ref 70–99)
Glucose-Capillary: 216 mg/dL — ABNORMAL HIGH (ref 70–99)

## 2019-11-28 LAB — BASIC METABOLIC PANEL WITH GFR
Anion gap: 8 (ref 5–15)
BUN: 19 mg/dL (ref 8–23)
CO2: 22 mmol/L (ref 22–32)
Calcium: 9.1 mg/dL (ref 8.9–10.3)
Chloride: 111 mmol/L (ref 98–111)
Creatinine, Ser: 1.6 mg/dL — ABNORMAL HIGH (ref 0.61–1.24)
GFR calc Af Amer: 45 mL/min — ABNORMAL LOW
GFR calc non Af Amer: 38 mL/min — ABNORMAL LOW
Glucose, Bld: 94 mg/dL (ref 70–99)
Potassium: 4.3 mmol/L (ref 3.5–5.1)
Sodium: 141 mmol/L (ref 135–145)

## 2019-11-28 LAB — CBC
HCT: 38.6 % — ABNORMAL LOW (ref 39.0–52.0)
Hemoglobin: 12 g/dL — ABNORMAL LOW (ref 13.0–17.0)
MCH: 29.3 pg (ref 26.0–34.0)
MCHC: 31.1 g/dL (ref 30.0–36.0)
MCV: 94.4 fL (ref 80.0–100.0)
Platelets: 199 10*3/uL (ref 150–400)
RBC: 4.09 MIL/uL — ABNORMAL LOW (ref 4.22–5.81)
RDW: 13.9 % (ref 11.5–15.5)
WBC: 7.8 10*3/uL (ref 4.0–10.5)
nRBC: 0 % (ref 0.0–0.2)

## 2019-11-28 LAB — MAGNESIUM
Magnesium: 1.7 mg/dL (ref 1.7–2.4)
Magnesium: 1.7 mg/dL (ref 1.7–2.4)

## 2019-11-28 LAB — PHOSPHORUS
Phosphorus: 2.5 mg/dL (ref 2.5–4.6)
Phosphorus: 2.2 mg/dL — ABNORMAL LOW (ref 2.5–4.6)

## 2019-11-28 MED ORDER — INSULIN ASPART 100 UNIT/ML ~~LOC~~ SOLN
0.0000 [IU] | SUBCUTANEOUS | Status: DC
  Administered 2019-11-28: 7 [IU] via SUBCUTANEOUS
  Administered 2019-11-29: 4 [IU] via SUBCUTANEOUS
  Administered 2019-11-29: 3 [IU] via SUBCUTANEOUS
  Administered 2019-11-29 (×2): 15 [IU] via SUBCUTANEOUS
  Administered 2019-11-29 – 2019-11-30 (×3): 3 [IU] via SUBCUTANEOUS
  Administered 2019-11-30: 4 [IU] via SUBCUTANEOUS

## 2019-11-28 MED ORDER — POTASSIUM & SODIUM PHOSPHATES 280-160-250 MG PO PACK
2.0000 | PACK | ORAL | Status: AC
  Administered 2019-11-28 – 2019-11-29 (×2): 2
  Filled 2019-11-28 (×3): qty 2

## 2019-11-28 NOTE — Progress Notes (Signed)
Physical Therapy Treatment Patient Details Name: DARIL WARGA MRN: [id number] DOB: 1930-11-04 Today's Date: 11/28/2019    History of Present Illness Mr. Alecxis Baltzell is an 86 year old male with a PMHx of laryngeal cancer s/p radiation (most recent session 11/19/2019), HTN, HLD, DMII, CHF s/p ICD presenting with dysphagia, nausea/vomiting and weight loss. EGD findings without any esophageal abnormalities but possible oropharynx etiology of symptoms. ENT consulted and patient had transnasal laryngoscopy yesterday significant for right hemilarynx immobility and marked posterior laryngeal edema secondary to radiation.    PT Comments    Pt was seen for mobility and strengthening with gait on RW requiring close monitoring to avoid getting into unsafe situations.  Specifically, he is running into stationary objects and requires specific cues to safely set up to sit.  He is better with static standing balance and therefore is demonstrating some balance skills that are improving, but cannot walk longer trips alone.  Follow acutely for these needs.  Follow Up Recommendations  Home health PT     Equipment Recommendations  None recommended by PT    Recommendations for Other Services       Precautions / Restrictions Precautions Precautions: Fall Precaution Comments: pt requires monitoring for O2 sats Restrictions Weight Bearing Restrictions: No    Mobility  Bed Mobility Overal bed mobility: Needs Assistance Bed Mobility: Supine to Sit;Sit to Supine     Supine to sit: Min guard Sit to supine: Min guard      Transfers Overall transfer level: Needs assistance Equipment used: Rolling walker (2 wheeled) Transfers: Sit to/from Stand Sit to Stand: Supervision         General transfer comment: cued for safety but reminders about controlling initial balance  Ambulation/Gait Ambulation/Gait assistance: Min guard Gait Distance (Feet): 200 Feet Assistive device: Rolling walker (2  wheeled) Gait Pattern/deviations: Step-through pattern;Wide base of support;Decreased stride length Gait velocity: decreased Gait velocity interpretation: <1.31 ft/sec, indicative of household ambulator General Gait Details: Min guard with RW to control safety with wider more impulsive turns.   Stairs             Wheelchair Mobility    Modified Rankin (Stroke Patients Only)       Balance Overall balance assessment: Needs assistance Sitting-balance support: Feet supported Sitting balance-Leahy Scale: Good     Standing balance support: Bilateral upper extremity supported;During functional activity Standing balance-Leahy Scale: Fair                              Cognition Arousal/Alertness: Awake/alert Behavior During Therapy: WFL for tasks assessed/performed Overall Cognitive Status: Difficult to assess                                 General Comments: impulsive, very motivated      Exercises      General Comments General comments (skin integrity, edema, etc.): controlled balance with RW and cued for safety as pt does not attend to obstacles and ran into two stationary objects      Pertinent Vitals/Pain Pain Assessment: Faces Faces Pain Scale: No hurt    Home Living                      Prior Function            PT Goals (current goals can now be found in the care plan section)  Acute Rehab PT Goals Patient Stated Goal: did not state Progress towards PT goals: Progressing toward goals    Frequency    Min 3X/week      PT Plan Current plan remains appropriate    Co-evaluation              AM-PAC PT "6 Clicks" Mobility   Outcome Measure  Help needed turning from your back to your side while in a flat bed without using bedrails?: None Help needed moving from lying on your back to sitting on the side of a flat bed without using bedrails?: None Help needed moving to and from a bed to a chair (including a  wheelchair)?: A Little Help needed standing up from a chair using your arms (e.g., wheelchair or bedside chair)?: A Little Help needed to walk in hospital room?: A Little Help needed climbing 3-5 steps with a railing? : A Lot 6 Click Score: 19    End of Session Equipment Utilized During Treatment: Gait belt Activity Tolerance: Patient tolerated treatment well Patient left: in bed;with call bell/phone within reach;with bed alarm set Nurse Communication: Mobility status PT Visit Diagnosis: Unsteadiness on feet (R26.81);Muscle weakness (generalized) (M62.81)     Time: 1315-1340 PT Time Calculation (min) (ACUTE ONLY): 25 min  Charges:  $Gait Training: 8-22 mins $Therapeutic Activity: 8-22 mins                    Ramond Dial 11/28/2019, 8:51 PM   Mee Hives, PT MS Acute Rehab Dept. Number: [place]

## 2019-11-28 NOTE — Progress Notes (Signed)
OT Cancellation Note  Patient Details Name: [name] MRN: [id number] DOB: 1931-09-08   Cancelled Treatment:    Reason Eval/Treat Not Completed: Patient at procedure or test/ unavailable. Pt working with PT. Plan to reattempt at a later time/date.   Tyrone Schimke, OT Acute Rehabilitation Services Pager: [phone] Office: [phone]  11/28/2019, 1:23 PM

## 2019-11-28 NOTE — Progress Notes (Signed)
NAME:  [name], MRN:  [id number], DOB:  04/13/1931, LOS: 8 ADMISSION DATE:  11/20/2019   Subjective/Interm history  No overnight events VSS No complains/pain this morning. Discussed that we will need to monitor inpatient for a few more days to make sure feeds are going ok  Objective   Blood pressure 135/90, pulse 72, temperature 98.3 F (36.8 C), temperature source Oral, resp. rate 16, height 5\' 11"  (1.803 m), weight 64.4 kg, SpO2 98 %.     Intake/Output Summary (Last 24 hours) at 11/28/2019 0600 Last data filed at 11/28/2019 0300 Gross per 24 hour  Intake 0 ml  Output 0 ml  Net 0 ml   Filed Weights   11/23/19 2041 11/24/19 0500 11/27/19 1900  Weight: 53 kg 53 kg 64.4 kg    Examination: GENERAL: chronically ill appearing.in no acute distress HEENT: hoarse voice CARDIAC: heart RRR.  PULMONARY: diffuse rhonchi throughout Abdomen: PEG present with intact dressing. No surrounding erythema or drainage. SKIN: intact dressing to anterior neck. No drainage or surrounding edema  Labs    CBC Latest Ref Rng & Units 11/27/2019 11/25/2019 11/24/2019  WBC 4.0 - 10.5 K/uL 7.1 7.2 10.2  Hemoglobin 13.0 - 17.0 g/dL 11.0(L) 11.0(L) 11.6(L)  Hematocrit 39.0 - 52.0 % 35.4(L) 35.9(L) 38.0(L)  Platelets 150 - 400 K/uL 138(L) 201 146(L)   BMP Latest Ref Rng & Units 11/27/2019 11/26/2019 11/25/2019  Glucose 70 - 99 mg/dL 143(H) 215(H) 165(H)  BUN 8 - 23 mg/dL 21 23 21   Creatinine 0.61 - 1.24 mg/dL 1.42(H) 1.61(H) 1.61(H)  BUN/Creat Ratio 10 - 24 - - -  Sodium 135 - 145 mmol/L 141 139 143  Potassium 3.5 - 5.1 mmol/L 4.7 4.4 4.7  Chloride 98 - 111 mmol/L 110 107 111  CO2 22 - 32 mmol/L 21(L) 25 26  Calcium 8.9 - 10.3 mg/dL 8.7(L) 9.0 9.1    Summary  86 yo male with recent history of laryngeal cancer s/p radiation treatment on 2/24 who presented with dysphagia, n/v, weight loss and found to have post radiation laryngeal edema.  Assessment & Plan:  Principal Problem:   Malignant neoplasm of  glottis (HCC) Active Problems:   Type 2 diabetes mellitus with peripheral vascular disease (HCC)   Chronic systolic congestive heart failure (HCC)   [name] (chronic kidney disease) stage 3, GFR 30-59 ml/min (HCC)   Weight loss   Pharyngeal dysphagia   Protein-calorie malnutrition, severe   Post radiation therapy laryngeal edema with dysphagia and severe protein calorie malnutrition G-tube placed 3/4. No complications. Will need to be monitored for a few days to make sure he does not undergo refeeding syndrome with increase nutrient intake. Post radiation anterior neck skin breakdown.  Plan Appreciate dietary recommendations: can start feeds today. Start with 150 ml Osmolite 1.5 five times daily  -Increase bolus feedings 50 ml each day to reach 240 ml (1 carton) five times daily  -Add 30 ml Prostat daily  -Add 100 ml free water before and after each feeding Will need education on tube feedings prior to discharge. Discussed with dietician.  Will also most likely need [place] RN to assist with this. Medrol dose pack to assist with laryngeal edema Continue lidocaine oral solution prn Dronabinol for nausea BMP in am  T2DM. Blood sugars stable. Continue SSI HFrEF (EF 30-35%). Continue to hold home dose diuretic for AKI.  HTN. Blood pressures stable. Continue carvedilol.  Best practice:  CODE STATUS: Full Diet: Dysphasia 1 DVT for prophylaxis: SCDs Dispo: Pending  G tube placement. Discharge suspected in 2-3d.  Home health PT/OT/RN on discharge.  Mitzi Hansen, MD INTERNAL MEDICINE RESIDENT PGY-1 PAGER #: [phone] 11/28/19 6:00 AM

## 2019-11-28 NOTE — Plan of Care (Signed)
  Problem: Clinical Measurements: Goal: Diagnostic test results will improve Outcome: Progressing   

## 2019-11-28 NOTE — Progress Notes (Addendum)
Supervising Physician: Arne Cleveland  Patient Status:  [facility] - In-pt  Chief Complaint:  Dysphagia s/p G tube placement  Subjective:  History of laryngeal cancer s/p radiation with laryngeal edema and dysphagia with weight loss and malnutrition. IR placed a gastrostomy tube on 3.4.21. patient states that he is tolerating tube feeds without pain or difficulty. Exit site is unremarkable without erythema, tenderness or drainage noted. Patient alert and laying in bed, calm and comfortable. Denies any fevers, headache, chest pain, SOB, cough, abdominal pain, nausea, vomiting or bleeding.  Patient states that they have been using the gastrostomy tube without difficulty and denies any pain with feeds.    Allergies: Patient has no known allergies.  Medications: Prior to Admission medications   Medication Sig Start Date End Date Taking? Authorizing Provider  atorvastatin (LIPITOR) 40 MG tablet TAKE 1 TABLET(40 MG) BY MOUTH DAILY AT 6 PM Patient taking differently: Take 40 mg by mouth daily at 6 PM.  03/03/19  Yes Axel Filler, MD  bisacodyl (DULCOLAX) 5 MG EC tablet Take 2 tablets (10 mg total) by mouth daily. 07/31/19 07/30/20 Yes Georgette Shell, MD  carvedilol (COREG) 6.25 MG tablet Take 1 tablet (6.25 mg total) by mouth 2 (two) times daily with a meal. 07/31/19  Yes Georgette Shell, MD  fluorometholone (FML) 0.1 % ophthalmic suspension Place 2 drops into both eyes 2 (two) times daily.   Yes [provider]  furosemide (LASIX) 40 MG tablet Take 1 tablet (40 mg total) by mouth 2 (two) times daily. 04/14/19  Yes Evans Lance, MD  insulin glargine (LANTUS) 100 UNIT/ML injection Inject 0.07 mLs (7 Units total) into the skin daily. 05/27/19  Yes Madalyn Rob, MD  lidocaine (XYLOCAINE) 2 % solution Patient: Mix 1part 2% viscous lidocaine, 1part H20. Swallow 17m of diluted mixture, 373m before meals and at bedtime, up to QID 10/27/19  Yes SqEppie GibsonMD  ofloxacin  (OCUFLOX) 0.3 % ophthalmic solution 1 drop 4 (four) times daily. 11/06/19  Yes [provider]  pantoprazole (PROTONIX) 40 MG tablet Take 1 tablet (40 mg total) by mouth daily. 10/07/19  Yes ViAxel FillerMD  polyethylene glycol (MIRALAX / GLYCOLAX) 17 g packet Take 17 g by mouth daily. 07/31/19  Yes MaGeorgette ShellMD  senna-docusate (SENOKOT-S) 8.6-50 MG tablet Take 2 tablets by mouth daily. Patient taking differently: Take 2 tablets by mouth daily as needed for mild constipation.  05/04/16  Yes PaKonrad FelixPA  tamsulosin (FLOMAX) 0.4 MG CAPS capsule Take 0.8 mg by mouth at bedtime.    Yes [provider]  VENTOLIN HFA 108 (90 Base) MCG/ACT inhaler INHALE 2 PUFFS INTO THE LUNGS EVERY 6 HOURS AS NEEDED FOR WHEEZING OR SHORTNESS OF BREATH Patient taking differently: Inhale 2 puffs into the lungs every 6 (six) hours as needed for wheezing or shortness of breath.  03/15/16  Yes ViAxel FillerMD  alum & mag hydroxide-simeth (MAALOX/MYLANTA) 200-200-20 MG/5ML suspension Take 15 mLs by mouth every 6 (six) hours as needed for indigestion or heartburn. Patient not taking: Reported on 11/20/2019 03/27/19   Wieters, HaMadelynn Done, PA-C  Blood Glucose Monitoring Suppl (ACCU-CHEK AVIVA PLUS) w/Device KIT Check finger stick glucose once daily 05/06/18   ViAxel FillerMD  glucose blood (ACCU-CHEK AVIVA PLUS) test strip Check blood sugar up to 3 times a day 09/15/19   ViAxel FillerMD  Hydrocortisone, Perianal, (PROCTO-PAK) 1 % CREA Apply 1 application topically 2 (two)  times daily. Patient not taking: Reported on 11/20/2019 07/25/19   Nelson, Yvonne Sue, MD  Insulin Pen Needle (B-D ULTRAFINE III SHORT PEN) 31G X 8 MM MISC USE ONCE DAILY WITH LANTUS PEN 05/06/18   Vincent, Duncan Thomas, MD  lidocaine (XYLOCAINE) 2 % jelly Apply 1 application topically as needed. Patient not taking: Reported on 11/20/2019 07/25/19   Nelson, Yvonne Sue, MD     Vital  Signs: BP 130/64 (BP Location: Left Arm)   Pulse 67   Temp 98.2 F (36.8 C) (Oral)   Resp 15   Ht 5' 11" (1.803 m)   Wt 141 lb 15.6 oz (64.4 kg)   SpO2 94%   BMI 19.80 kg/m   Physical Exam Vitals and nursing note reviewed.  Constitutional:      Appearance: He is well-developed.  HENT:     Head: Normocephalic.  Pulmonary:     Effort: Pulmonary effort is normal.  Abdominal:     General: There is no distension.     Tenderness: There is no abdominal tenderness. There is no guarding.     Comments: Gastrostomy tube exit site is unremarkable with no erythema, tenderness or drainage noted. Dressing is clean dry and intact.  Musculoskeletal:        General: Normal range of motion.     Cervical back: Normal range of motion.  Skin:    General: Skin is dry.  Neurological:     Mental Status: He is alert and oriented to person, place, and time.     Imaging: IR GASTROSTOMY TUBE MOD SED  Result Date: 11/27/2019 INDICATION: 86-year-old male referred for percutaneous gastrostomy EXAM: PERC PLACEMENT GASTROSTOMY MEDICATIONS: 2 g Ancef; Antibiotics were administered within 1 hour of the procedure. ANESTHESIA/SEDATION: Versed 1.0 mg IV; Fentanyl 50 mcg IV Moderate Sedation Time:  13 minutes The patient was continuously monitored during the procedure by the interventional radiology nurse under my direct supervision. CONTRAST:  15mL OMNIPAQUE IOHEXOL 300 MG/ML SOLN - administered into the gastric lumen. FLUOROSCOPY TIME:  Fluoroscopy Time: 1 minutes 24 seconds (4 mGy). COMPLICATIONS: None PROCEDURE: Informed written consent was obtained from the patient and the patient's family after a thorough discussion of the procedural risks, benefits and alternatives. All questions were addressed. Maximal Sterile Barrier Technique was utilized including caps, mask, sterile gowns, sterile gloves, sterile drape, hand hygiene and skin antiseptic. A timeout was performed prior to the initiation of the procedure. The  epigastrium was prepped with Betadine in a sterile fashion, and a sterile drape was applied covering the operative field. A sterile gown and sterile gloves were used for the procedure. A 5-French orogastric tube is placed under fluoroscopic guidance. Scout imaging of the abdomen confirms barium within the transverse colon. The stomach was distended with gas. Under fluoroscopic guidance, an 18 gauge needle was utilized to puncture the anterior wall of the body of the stomach. An Amplatz wire was advanced through the needle passing a T fastener into the lumen of the stomach. The T fastener was secured for gastropexy. A 9-French sheath was inserted. A snare was advanced through the 9-French sheath. A Benson was advanced through the orogastric tube. It was snared then pulled out the oral cavity, pulling the snare, as well. The leading edge of the gastrostomy was attached to the snare. It was then pulled down the esophagus and out the percutaneous site. Tube secured in place. Contrast was injected. Brief oxygen desaturation at the end of the procedure was managed with jaw thrust and face   mass. The patient recovered thereafter without any problems. No complications were encountered and no significant blood loss encountered. IMPRESSION: Status post fluoroscopic placed percutaneous gastrostomy tube, with 20 [place] pull-through. Signed, [name]. [name], DO [facility] Radiology Electronically Signed   By: [name] D.O.   On: 11/27/2019 10:14    Labs:  CBC: Recent Labs    11/24/19 0650 11/25/19 0426 11/27/19 0303 11/28/19 0543  WBC 10.2 7.2 7.1 7.8  HGB 11.6* 11.0* 11.0* 12.0*  HCT 38.0* 35.9* 35.4* 38.6*  PLT 146* 201 138* 199    COAGS: Recent Labs    11/21/19 1008  INR 1.3*    BMP: Recent Labs    11/25/19 0426 11/26/19 0443 11/27/19 0303 11/28/19 0543  NA 143 139 141 141  K 4.7 4.4 4.7 4.3  CL 111 107 110 111  CO2 26 25 21* 22  GLUCOSE  165* 215* 143* 94  BUN _0 CALCIUM 9.1 9.0 8.7* 9.1  CREATININE 1.61* 1.61* 1.42* 1.60*  GFRNONAA 38* 38* 44* 38*  GFRAA 44* 44* 51* 45*    LIVER FUNCTION TESTS: Recent Labs    05/20/19 1955 11/21/19 1008  BILITOT 0.8 0.6  AST 32 14*  ALT 14 13  ALKPHOS 55 65  PROT 7.7 6.6  ALBUMIN 3.7 2.9*    Assessment and Plan:  86 y.o, male inpatient. History of laryngeal cancer s/p radiation with laryngeal edema and dysphagia with weight loss and malnutrition. IR placed a gastrostomy tube on 3.4.21. patient states that he is tolerating tube feeds without pain or difficulty. Exit site is unremarkable without erythema, tenderness or drainage noted.    Pertinent Imaging None since g tube placement  Pertinent IR History 3.4.21 - placement g tube  Pertinent Allergies NKDA  All labs and medications are within acceptable parameters. Patient is afebrile.  Gastrostomy tube is able to be used immediately. Patient to stable from IR perspective s/p gastrostomy tub. Further plans per primary Team please call IR with questions or concerns.    Electronically Signed: Avel Peace, NP 11/28/2019, 3:07 PM   I spent a total of 15 Minutes at the the patient's bedside AND on the patient's hospital floor or unit, greater than 50% of which was counseling/coordinating care for g tube placement

## 2019-11-28 NOTE — Progress Notes (Signed)
Nutrition Brief Note  Patient tolerating 150 ml Osmolite 1.5 bolus feedings per RN (had three so far today) . Plan advancement in bolus feedings tomorrow to goal regimen of:   -1 carton (237 ml) Osmolite 1.5 five times daily  -30 ml Prostat daily   At goal TF provides: 1875 kcal, 90 grams protein, and 905 ml free water.   RN educated patient on how to administer bolus feedings. Per patient son planning on being care taker and will give feeds at home. Son not in the room at time of visit. Continue to monitor magnesium, potassium, and phosphorus daily for at least 3 days, MD to replete as needed, as pt is at risk for refeeding syndrome.   Mariana Single RD, LDN Clinical Nutrition Pager listed in [place]

## 2019-11-28 NOTE — Progress Notes (Signed)
  Speech Language Pathology Treatment: Dysphagia  Patient Details Name: [name] MRN: [id number] DOB: 04/27/1931 Today's Date: 11/28/2019 Time: 6599-3570 SLP Time Calculation (min) (ACUTE ONLY): 13 min  Assessment / Plan / Recommendation Clinical Impression  Patient received at bedside for ST. Patient underwent procedure for G-tube placement yesterday. ST reviewed results of recent MBSS (11/24/19) with patient including findings of pharyngeal dysphagia and moderate- high risk of aspiration with PO. Patient verbalized understanding, stating "that's why I'll just eat with the tube." ST educated patient re: safest oral diet texture puree solids and honey thick liquids with chin tuck+L head turn with every swallow. Pt seen with small very spoon sips of HTL x2, pt demonstrated ability to tuck chin and turn head left with fading verbal cues. Pt with immediate cough x1. ST discussed option of oral diet with accepting known risks, discussed patient recommended to use postural modifications of chin tuck/left head turn if he wishes to consume orally with dysphagia 1/HTL the safest textures. Patient stated that he would like to receive nutrition/hydration via Gtube at this time, but follow-up recommended. ST to follow as per POC.   HPI HPI: Pt is an 86 y.o. male admitted 11/20/19 with FTT, weight loss and difficulty swallowing. Severe throat pain. PMH: laryngeal cancer s/p 20 radiation tx (last tx 11/19/19), HTN, HLD, DM2, CHF. 06/2019 laryngoscopy  = irregular right vocal fold  and right hemilarynx with decreased movement. 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 Seen at outpatient [facility] for bedside swallow eval and for home excerise program (10/30/19).      SLP Plan  Continue with current plan of care       Recommendations  Diet recommendations: NPO Medication Administration: Via alternative means Postural Changes and/or Swallow Maneuvers: Chin  tuck;Head turn left during swallow                Oral Care Recommendations: Oral care BID Follow up Recommendations: 24 hour supervision/assistance SLP Visit Diagnosis: Dysphagia, pharyngeal phase (R13.13) Plan: Continue with current plan of care       [place] 11/28/2019, 1:57 PM [name], M.Ed., [place] [phone]: Acute Rehab office [phone] - pager

## 2019-11-28 NOTE — TOC Progression Note (Addendum)
Transition of Care [facility]) - Progression Note    Patient Details  Name: [name] MRN: [id number] Date of Birth: Feb 26, 1931  Transition of [facility]) CM/SW Contact  Shalla Bulluck, Edson Snowball, RN Phone Number: 11/28/2019, 2:04 PM  Clinical Narrative:     Possible discharge on Sunday, requested orders. Patient wants to continue with [facility] aware. PAtient understands he will have to learn how to do bolus tube feeds and tube prior to discharge. Messaged nursing   Will order tube feeds with Zack with [place] .   Expected Discharge Plan: [place] Barriers to Discharge: Continued Medical Work up  Expected Discharge Plan and Services Expected Discharge Plan: [place]   Discharge Planning Services: CM Consult Post Acute Care Choice: [place] arrangements for the past 2 months: Apartment                 DME Arranged: Tube feeding DME Agency: [employer], [employer] Date DME Agency Contacted: 11/28/19 Time DME Agency Contacted: 1607 Representative spoke with at DME Agency: [place]: RN, PT, Speech Therapy HH Agency: [facility] Date [place]: 11/28/19 Time [place]: [place] Representative spoke with at [place]: [place]) Interventions    Readmission Risk Interventions No flowsheet data found.

## 2019-11-29 DIAGNOSIS — Z8521 Personal history of malignant neoplasm of larynx: Secondary | ICD-10-CM

## 2019-11-29 DIAGNOSIS — T17920A Food in respiratory tract, part unspecified causing asphyxiation, initial encounter: Secondary | ICD-10-CM

## 2019-11-29 DIAGNOSIS — T17928A Food in respiratory tract, part unspecified causing other injury, initial encounter: Secondary | ICD-10-CM

## 2019-11-29 DIAGNOSIS — I2510 Atherosclerotic heart disease of native coronary artery without angina pectoris: Secondary | ICD-10-CM

## 2019-11-29 DIAGNOSIS — J387 Other diseases of larynx: Secondary | ICD-10-CM

## 2019-11-29 LAB — BASIC METABOLIC PANEL
Anion gap: 6 (ref 5–15)
BUN: 24 mg/dL — ABNORMAL HIGH (ref 8–23)
CO2: 25 mmol/L (ref 22–32)
Calcium: 8.9 mg/dL (ref 8.9–10.3)
Chloride: 109 mmol/L (ref 98–111)
Creatinine, Ser: 1.45 mg/dL — ABNORMAL HIGH (ref 0.61–1.24)
GFR calc Af Amer: 50 mL/min — ABNORMAL LOW (ref >60)
GFR calc non Af Amer: 43 mL/min — ABNORMAL LOW (ref >60)
Glucose, Bld: 158 mg/dL — ABNORMAL HIGH (ref 70–99)
Potassium: 4.6 mmol/L (ref 3.5–5.1)
Sodium: 140 mmol/L (ref 135–145)

## 2019-11-29 LAB — GLUCOSE, CAPILLARY
Glucose-Capillary: 133 mg/dL — ABNORMAL HIGH (ref 70–99)
Glucose-Capillary: 143 mg/dL — ABNORMAL HIGH (ref 70–99)
Glucose-Capillary: 184 mg/dL — ABNORMAL HIGH (ref 70–99)
Glucose-Capillary: 334 mg/dL — ABNORMAL HIGH (ref 70–99)
Glucose-Capillary: 325 mg/dL — ABNORMAL HIGH (ref 70–99)

## 2019-11-29 LAB — MAGNESIUM: Magnesium: 1.8 mg/dL (ref 1.7–2.4)

## 2019-11-29 LAB — PHOSPHORUS: Phosphorus: 2.4 mg/dL — ABNORMAL LOW (ref 2.5–4.6)

## 2019-11-29 MED ORDER — CARVEDILOL 6.25 MG PO TABS
6.2500 mg | ORAL_TABLET | Freq: Two times a day (BID) | ORAL | Status: DC
  Administered 2019-11-30: 6.25 mg
  Filled 2019-11-29: qty 1

## 2019-11-29 MED ORDER — ACETAMINOPHEN 325 MG PO TABS: 650.0000 mg | ORAL_TABLET | Freq: Four times a day (QID) | ORAL | Status: DC | PRN

## 2019-11-29 MED ORDER — ACETAMINOPHEN 650 MG RE SUPP: 650.0000 mg | Freq: Four times a day (QID) | RECTAL | Status: DC | PRN

## 2019-11-29 MED ORDER — ENOXAPARIN SODIUM 40 MG/0.4ML ~~LOC~~ SOLN
40.0000 mg | SUBCUTANEOUS | Status: DC
  Administered 2019-11-29: 40 mg via SUBCUTANEOUS
  Filled 2019-11-29: qty 0.4

## 2019-11-29 MED ORDER — SENNOSIDES-DOCUSATE SODIUM 8.6-50 MG PO TABS
2.0000 | ORAL_TABLET | Freq: Two times a day (BID) | ORAL | Status: DC
  Administered 2019-11-29: 2
  Filled 2019-11-29: qty 2

## 2019-11-29 MED ORDER — POLYETHYLENE GLYCOL 3350 17 G PO PACK: 17.0000 g | PACK | Freq: Every day | ORAL | Status: DC

## 2019-11-29 MED ORDER — PANTOPRAZOLE SODIUM 40 MG PO PACK
40.0000 mg | PACK | Freq: Every day | ORAL | Status: DC
  Administered 2019-11-29 – 2019-11-30 (×2): 40 mg
  Filled 2019-11-29 (×2): qty 20

## 2019-11-29 MED ORDER — ATORVASTATIN CALCIUM 40 MG PO TABS: 40.0000 mg | ORAL_TABLET | Freq: Every day | ORAL | Status: DC

## 2019-11-29 MED ORDER — POTASSIUM & SODIUM PHOSPHATES 280-160-250 MG PO PACK
2.0000 | PACK | ORAL | Status: AC
  Administered 2019-11-29 (×3): 2 via ORAL
  Filled 2019-11-29 (×3): qty 2

## 2019-11-29 MED ORDER — OXYCODONE HCL 5 MG PO TABS: 5.0000 mg | ORAL_TABLET | ORAL | Status: DC | PRN

## 2019-11-29 NOTE — Care Management (Signed)
Plan is for d/c tomorrow.  Per [employer], tube feeding supplies delivered to home yesterday.  I have not reached sister to confirm this- no return call since left VM.  [facility] has patient scheduled for nurse visit tomorrow afternoon and request that patient leave hospital by 3pm.  Patient states he needs ambulance transport so PTAR will need to be arranged by 1pm.   MD and RN aware and d/c will be written early in the morning.

## 2019-11-29 NOTE — Progress Notes (Signed)
Internal Medicine Attending Note:  I have seen and evaluated this patient and I have discussed the plan of care with the house staff. Please see their note for complete details. I concur with their findings.  Velna Ochs, MD 11/29/2019, 6:30 PM

## 2019-11-29 NOTE — Progress Notes (Signed)
   Subjective:   Doing well this morning. Received education on working with G-tube yesterday and states he feels comfortable with this.   Objective:  Vital signs in last 24 hours: Vitals:   11/28/19 1639 11/28/19 1900 11/29/19 0500 11/29/19 0506  BP: 118/65 136/60  139/65  Pulse: 69 67 66 70  Resp:    14  Temp: 98.2 F (36.8 C) 98.2 F (36.8 C)  98.1 F (36.7 C)  TempSrc: Oral Oral  Oral  SpO2: 97% 98% 95%   Weight:  65.5 kg    Height:        Constitution: NAD, chronically ill appearing Cardio: RRR, no m/r/g, no LE edema  Respiratory: CTA, no w/r/r, upper airway secretions Abdominal: NTTP, soft, non-distended, G-tube site with dressings in place, no drainage or erythema MSK: moving all extremities Neuro: normal affect, a&ox3 Skin: skin excoriation anterior neck, dressings in place  Assessment/Plan:  Principal Problem:   Malignant neoplasm of glottis (HCC) Active Problems:   Type 2 diabetes mellitus with peripheral vascular disease (HCC)   Chronic systolic congestive heart failure (HCC)   [name] (chronic kidney disease) stage 3, GFR 30-59 ml/min (HCC)   Weight loss   Pharyngeal dysphagia   Protein-calorie malnutrition, severe  86 year old male with past medical history of laryngeal cancer status post radiation treatment completed February 24 who presented with dysphagia, found to have immobile hemilarynx.  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 Arranging with social work for outpatient [place]. Monitoring for refeeding syndrome with increase in diet 2/2 tube feeds. He is requiring phosphate for refeeding currently although other electrolytes remain wnl.   - monitor phosphorous and electrolytes overnight - replete phos - likely stable for discharge tomorrow  - f/u social work for stable transition  - cont. Medrol taper  - cont. Dronabinol  - cont. Tube feeds - osmolite five times daily &  prostat  TIIDM Cont. SSI, will likely need increase in therapy at discharge with tube feeds and steroids   CAD HFrEF Cont. Atorvastatin, coreg 6.25 mg bid  VTE: loveno IVF: none Diet: tube feeds Code: full   Dispo: Anticipated discharge tomorrow.   Marty Heck, DO 11/29/2019, 6:35 AM Pager: [phone]

## 2019-11-29 NOTE — Progress Notes (Signed)
Most of patient's medications are listed for oral route, however pt states he takes all of his medications per tube. Osmolite order and pro-stat are the only medications listed for per tube. RN messaged on call MD to clarify.   Eleanora Neighbor, RN

## 2019-11-30 ENCOUNTER — Other Ambulatory Visit: Payer: Self-pay | Admitting: Internal Medicine

## 2019-11-30 DIAGNOSIS — I130 Hypertensive heart and chronic kidney disease with heart failure and stage 1 through stage 4 chronic kidney disease, or unspecified chronic kidney disease: Secondary | ICD-10-CM | POA: Diagnosis not present

## 2019-11-30 DIAGNOSIS — E1122 Type 2 diabetes mellitus with diabetic chronic kidney disease: Secondary | ICD-10-CM | POA: Diagnosis not present

## 2019-11-30 DIAGNOSIS — R1312 Dysphagia, oropharyngeal phase: Secondary | ICD-10-CM | POA: Diagnosis not present

## 2019-11-30 DIAGNOSIS — C320 Malignant neoplasm of glottis: Secondary | ICD-10-CM | POA: Diagnosis not present

## 2019-11-30 DIAGNOSIS — I5022 Chronic systolic (congestive) heart failure: Secondary | ICD-10-CM | POA: Diagnosis not present

## 2019-11-30 DIAGNOSIS — N401 Enlarged prostate with lower urinary tract symptoms: Secondary | ICD-10-CM | POA: Diagnosis not present

## 2019-11-30 DIAGNOSIS — N183 Chronic kidney disease, stage 3 unspecified: Secondary | ICD-10-CM | POA: Diagnosis not present

## 2019-11-30 DIAGNOSIS — R338 Other retention of urine: Secondary | ICD-10-CM | POA: Diagnosis not present

## 2019-11-30 DIAGNOSIS — K5900 Constipation, unspecified: Secondary | ICD-10-CM | POA: Diagnosis not present

## 2019-11-30 LAB — BASIC METABOLIC PANEL
Anion gap: 9 (ref 5–15)
BUN: 27 mg/dL — ABNORMAL HIGH (ref 8–23)
CO2: 24 mmol/L (ref 22–32)
Calcium: 8.7 mg/dL — ABNORMAL LOW (ref 8.9–10.3)
Chloride: 109 mmol/L (ref 98–111)
Creatinine, Ser: 1.5 mg/dL — ABNORMAL HIGH (ref 0.61–1.24)
GFR calc Af Amer: 48 mL/min — ABNORMAL LOW (ref >60)
GFR calc non Af Amer: 42 mL/min — ABNORMAL LOW (ref >60)
Glucose, Bld: 134 mg/dL — ABNORMAL HIGH (ref 70–99)
Potassium: 4.8 mmol/L (ref 3.5–5.1)
Sodium: 142 mmol/L (ref 135–145)

## 2019-11-30 LAB — GLUCOSE, CAPILLARY
Glucose-Capillary: 137 mg/dL — ABNORMAL HIGH (ref 70–99)
Glucose-Capillary: 178 mg/dL — ABNORMAL HIGH (ref 70–99)
Glucose-Capillary: 68 mg/dL — ABNORMAL LOW (ref 70–99)

## 2019-11-30 LAB — MAGNESIUM: Magnesium: 1.8 mg/dL (ref 1.7–2.4)

## 2019-11-30 LAB — PHOSPHORUS: Phosphorus: 3.1 mg/dL (ref 2.5–4.6)

## 2019-11-30 MED ORDER — FUROSEMIDE 40 MG PO TABS: 40.0000 mg | ORAL_TABLET | Freq: Every day | ORAL | 3 refills | Status: DC

## 2019-11-30 MED ORDER — SENNOSIDES-DOCUSATE SODIUM 8.6-50 MG PO TABS: 2.0000 | ORAL_TABLET | Freq: Every day | ORAL | 2 refills | Status: DC

## 2019-11-30 MED ORDER — DEXTROSE 50 % IV SOLN
INTRAVENOUS | Status: AC
  Administered 2019-11-30: 25 mL
  Filled 2019-11-30: qty 50

## 2019-11-30 MED ORDER — PRO-STAT SUGAR FREE PO LIQD: 30.0000 mL | Freq: Every day | ORAL | 0 refills | Status: AC

## 2019-11-30 MED ORDER — PANTOPRAZOLE SODIUM 40 MG PO TBEC: 40.0000 mg | DELAYED_RELEASE_TABLET | Freq: Every day | ORAL | 0 refills | Status: DC

## 2019-11-30 MED ORDER — OSMOLITE 1.5 CAL PO LIQD: 150.0000 mL | Freq: Every day | ORAL | 5 refills | Status: DC

## 2019-11-30 MED ORDER — CARVEDILOL 6.25 MG PO TABS: 6.2500 mg | ORAL_TABLET | Freq: Two times a day (BID) | ORAL | 1 refills | Status: DC

## 2019-11-30 NOTE — Progress Notes (Signed)
DISCHARGE NOTE HOME RICHARDSON DUBREE to be discharged Home per MD order. Discussed prescriptions and follow up appointments with the patient. Prescriptions given to patient; medication list explained in detail. Patient verbalized understanding. Patient also educated on tube feed instructions usingf teach back method. Patient given 5 feed doses to sustain unitl [facility] delivery  Skin clean, dry and intact without evidence of skin break down, no evidence of skin tears noted. IV catheter discontinued intact. Site without signs and symptoms of complications. Dressing and pressure applied. Pt denies pain at the site currently. No complaints noted.  Patient free of lines, drains, and wounds.   An After Visit Summary (AVS) was printed and given to the patient. Patient escorted via wheelchair, and discharged home via private auto.  Dorthey Sawyer, RN

## 2019-11-30 NOTE — TOC Transition Note (Signed)
Transition of Care [facility]) - CM/SW Discharge Note   Patient Details  Name: [name] MRN: [id number] Date of Birth: 04-14-1931  Transition [facility]) CM/SW Contact:  Claudie Leach, RN 11/30/2019, 10:46 AM   Clinical Narrative:    Discussed dc plan with patient's son this morning.  I was unable to contact family yesterday.  The son is planning to transport patient home today around noon - he states he does not need an ambulance.  The son states he has been contacted by [facility] RN to arrange visit for this afternoon.  The son also states that he has not received delivery of tube feeding supplies from [facility] and has not heard from them.  [facility] had informed me yesterday that delivery had been made. Today, [facility] states that they do not have Osmolite in stock and cannot deliver until Tuesday.    [facility] ([facility]) contacted and will be able to deliver to patient's home tomorrow.  Patient will need Osmolite/supplies, and perhaps one dose of Prostat to take home today to last for today and tomorrow.  RN is aware and will obtain from pharmacy.  Patient's son is also aware of delivery tomorrow and that he will be contacted in the morning.     Final next level of care: [place] Barriers to Discharge: Continued Medical Work up   Patient Goals and CMS Choice Patient states their goals for this hospitalization and ongoing recovery are:: to go home CMS Medicare.gov Compare Post Acute Care list provided to:: Patient Choice offered to / list presented to : Patient   Discharge Plan and Services   Discharge Planning Services: CM Consult Post Acute Care Choice: Home Health          DME Arranged: Tube feeding DME Agency: ([facility]; [facility] unable to provide supplies) Date DME Agency Contacted: 11/30/19 Time DME Agency Contacted: [phone] Representative spoke with at DME Agency: Marianna Fuss HH Arranged: RN, PT, Speech Therapy HH Agency: [facility] Date [place]: 11/28/19 Time [place]: [place] Representative spoke with at [place]: [place] ([place]) Interventions     Readmission Risk Interventions No flowsheet data found.

## 2019-11-30 NOTE — Progress Notes (Signed)
   NAME:  APOLONIO CUTTING, MRN:  [id number], DOB:  10-Jun-1931, LOS: 71 ADMISSION DATE:  11/20/2019   Subjective/Interm history  No overnight events VSS Discharging today  Objective   Blood pressure 123/60, pulse 65, temperature 97.8 F (36.6 C), temperature source Oral, resp. rate 18, height 5\' 11"  (1.803 m), weight 65.5 kg, SpO2 100 %.     Intake/Output Summary (Last 24 hours) at 11/30/2019 1719 Last data filed at 11/30/2019 1224 Gross per 24 hour  Intake 360 ml  Output 2100 ml  Net -1740 ml   Filed Weights   11/24/19 0500 11/27/19 1900 11/28/19 1900  Weight: 53 kg 64.4 kg 65.5 kg    Examination: GENERAL: chronically ill appearing.in no acute distress HEENT: hoarse voice CARDIAC: heart RRR.   PULMONARY: diffuse rhonchi throughout Abdomen: PEG present with intact dressing.  No surrounding erythema or edema.  No tenderness SKIN: Adhesive dressing to his anterior neck.  No drainage or edema.  Labs    CBC Latest Ref Rng & Units 11/28/2019 11/27/2019 11/25/2019  WBC 4.0 - 10.5 K/uL 7.8 7.1 7.2  Hemoglobin 13.0 - 17.0 g/dL 12.0(L) 11.0(L) 11.0(L)  Hematocrit 39.0 - 52.0 % 38.6(L) 35.4(L) 35.9(L)  Platelets 150 - 400 K/uL 199 138(L) 201   BMP Latest Ref Rng & Units 11/30/2019 11/29/2019 11/28/2019  Glucose 70 - 99 mg/dL 134(H) 158(H) 94  BUN 8 - 23 mg/dL 27(H) 24(H) 19  Creatinine 0.61 - 1.24 mg/dL 1.50(H) 1.45(H) 1.60(H)  BUN/Creat Ratio 10 - 24 - - -  Sodium 135 - 145 mmol/L 142 140 141  Potassium 3.5 - 5.1 mmol/L 4.8 4.6 4.3  Chloride 98 - 111 mmol/L 109 109 111  CO2 22 - 32 mmol/L 24 25 22   Calcium 8.9 - 10.3 mg/dL 8.7(L) 8.9 9.1    Summary  86 yo male with recent history of laryngeal cancer s/p radiation treatment on 2/24 who presented with dysphagia, n/v, weight loss and found to have post radiation laryngeal edema.  Assessment & Plan:  Principal Problem:   Malignant neoplasm of glottis (HCC) Active Problems:   Type 2 diabetes mellitus with peripheral vascular disease  (HCC)   Chronic systolic congestive heart failure (HCC)   [name] (chronic kidney disease) stage 3, GFR 30-59 ml/min (HCC)   Weight loss   Pharyngeal dysphagia   Protein-calorie malnutrition, severe   Aspiration of food   Post radiation therapy laryngeal edema with dysphagia and severe protein calorie malnutrition G-tube placed 3/4.  Appreciate nutrition's recommendations on tube feeds.  Will need close outpatient follow-up to assure appropriate feeds are being done.  Home health set up at discharge Completed a Medrol Dosepak. Post radiation anterior neck skin breakdown.  Plan Appreciate dietary recommendations: can start feeds today. Start with 150 ml Osmolite 1.5 five times daily  -Increase bolus feedings 50 ml each day to reach 240 ml (1 carton) five times daily  -Add 30 ml Prostat daily  -Add 100 ml free water before and after each feeding Will need education on tube feedings prior to discharge. Discussed with dietician.  Will also most likely need [place] RN to assist with this. Stable for discharge  T2DM. Blood sugars stable. Continue SSI  Best practice:  CODE STATUS: Full Diet: N.p.o. DVT for prophylaxis: SCDs Dispo: Discharged today  Mitzi Hansen, MD [place] PGY-1 PAGER #: [phone] 11/30/19 5:19 PM

## 2019-12-01 DIAGNOSIS — I130 Hypertensive heart and chronic kidney disease with heart failure and stage 1 through stage 4 chronic kidney disease, or unspecified chronic kidney disease: Secondary | ICD-10-CM | POA: Diagnosis not present

## 2019-12-01 DIAGNOSIS — E1122 Type 2 diabetes mellitus with diabetic chronic kidney disease: Secondary | ICD-10-CM | POA: Diagnosis not present

## 2019-12-01 DIAGNOSIS — I5022 Chronic systolic (congestive) heart failure: Secondary | ICD-10-CM | POA: Diagnosis not present

## 2019-12-01 DIAGNOSIS — R1312 Dysphagia, oropharyngeal phase: Secondary | ICD-10-CM | POA: Diagnosis not present

## 2019-12-01 DIAGNOSIS — N401 Enlarged prostate with lower urinary tract symptoms: Secondary | ICD-10-CM | POA: Diagnosis not present

## 2019-12-01 DIAGNOSIS — K5900 Constipation, unspecified: Secondary | ICD-10-CM | POA: Diagnosis not present

## 2019-12-01 DIAGNOSIS — R338 Other retention of urine: Secondary | ICD-10-CM | POA: Diagnosis not present

## 2019-12-01 DIAGNOSIS — C320 Malignant neoplasm of glottis: Secondary | ICD-10-CM | POA: Diagnosis not present

## 2019-12-01 DIAGNOSIS — E46 Unspecified protein-calorie malnutrition: Secondary | ICD-10-CM | POA: Diagnosis not present

## 2019-12-01 DIAGNOSIS — N183 Chronic kidney disease, stage 3 unspecified: Secondary | ICD-10-CM | POA: Diagnosis not present

## 2019-12-01 DIAGNOSIS — R1310 Dysphagia, unspecified: Secondary | ICD-10-CM | POA: Diagnosis not present

## 2019-12-01 LAB — GLUCOSE, CAPILLARY: Glucose-Capillary: 176 mg/dL — ABNORMAL HIGH (ref 70–99)

## 2019-12-01 NOTE — Discharge Summary (Addendum)
Name: [name] MRN: [id number] DOB: 01-Aug-1931 86 y.o. PCP: Robert Brothers MD   Date of Admission: 11/20/2019 12:33 PM Date of Discharge: 11/30/2019 Attending Physician: Robert Ochs, MD  Discharge Diagnosis: 1. 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 TIIDM 3. HFrEF   Discharge Medications: Allergies as of 11/30/2019   No Known Allergies     Medication List    STOP taking these medications   alum & mag hydroxide-simeth 200-200-20 MG/5ML suspension Commonly known as: MAALOX/MYLANTA   Dulcolax 5 MG EC tablet Generic drug: bisacodyl   lidocaine 2 % jelly Commonly known as: XYLOCAINE     TAKE these medications   Accu-Chek Aviva Plus test strip Generic drug: glucose blood Check blood sugar up to 3 times a day   Accu-Chek Aviva Plus w/Device Kit Check finger stick glucose once daily   atorvastatin 40 MG tablet Commonly known as: LIPITOR TAKE 1 TABLET(40 MG) BY MOUTH DAILY AT 6 PM What changed:   how much to take  how to take this  when to take this  additional instructions   carvedilol 6.25 MG tablet Commonly known as: COREG Place 1 tablet (6.25 mg total) into feeding tube 2 (two) times daily with a meal. What changed: how to take this   feeding supplement (OSMOLITE 1.5 CAL) Liqd Place 150-237 mLs into feeding tube 5 (five) times daily.   feeding supplement (PRO-STAT SUGAR FREE 64) Liqd Place 30 mLs into feeding tube daily.   fluorometholone 0.1 % ophthalmic suspension Commonly known as: FML Place 2 drops into both eyes 2 (two) times daily.   furosemide 40 MG tablet Commonly known as: LASIX Place 1 tablet (40 mg total) into feeding tube daily. What changed:   how to take this  when to take this   Hydrocortisone (Perianal) 1 % Crea Commonly known as: Procto-Pak Apply 1 application topically 2 (two) times daily.   insulin glargine 100 UNIT/ML injection Commonly known as: LANTUS Inject 0.07 mLs (7 Units total) into the skin  daily.   Insulin Pen Needle 31G X 8 MM Misc Commonly known as: B-D ULTRAFINE III SHORT PEN USE ONCE DAILY WITH LANTUS PEN   lidocaine 2 % solution Commonly known as: XYLOCAINE Patient: Mix 1part 2% viscous lidocaine, 1part H20. Swallow 57m of diluted mixture, 367m before meals and at bedtime, up to QID   ofloxacin 0.3 % ophthalmic solution Commonly known as: OCUFLOX 1 drop 4 (four) times daily.   pantoprazole 40 MG tablet Commonly known as: PROTONIX Take 1 tablet (40 mg total) by mouth daily.   polyethylene glycol 17 g packet Commonly known as: MIRALAX / GLYCOLAX Take 17 g by mouth daily.   senna-docusate 8.6-50 MG tablet Commonly known as: Senokot-S Place 2 tablets into feeding tube daily. What changed: how to take this   tamsulosin 0.4 MG Caps capsule Commonly known as: FLOMAX Take 0.8 mg by mouth at bedtime.   Ventolin HFA 108 (90 Base) MCG/ACT inhaler Generic drug: albuterol INHALE 2 PUFFS INTO THE LUNGS EVERY 6 HOURS AS NEEDED FOR WHEEZING OR SHORTNESS OF BREATH What changed: See the new instructions.       Disposition and follow-up:   [name] Stable condition.  At the hospital follow up visit please address:  1.  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 Discharged after eduction in how to use. Swallowing improved after steroid taper, which was completed but still showing signs of aspiration and recommended  to remain NPO and use PEG tube. Will need follow-up swallow study with speech at some point 2. TIIDM: Insulin requirement may be increased now that he is on tube feeds.  3. HFrEF: Lasix 40 mg bid was decreased to qd at discharge as he did not require this during admission due to FTT and decreased po intake. Reevaluate for increase back to bid.   2.  Labs / imaging needed at time of follow-up: Phos, BMP  3.  Pending labs/ test needing follow-up: none  Follow-up Appointments:    [facility] Course by problem list: 1. Immobile Hemilarynx [name] is a 86 y.o. male with medical history significant of laryngeal cancers/p radiation finished at the end of February, HTN, HLD, DM2, CHF s/p ICD, presented the ED with failure to thrive, dysphagia.  Patient reports severe throat pain, and muffled voice. EGD was done by GI without acute findings. ENT was consulted and patient was found to have immobile hemilarynx with edema 2/2 to his radiation without findings of plaques or tumor. Barium swallow was performed which showed significant aspiration. IR was consulted for peg tube placement, which was done 3/4 without issue. He was educated in how to use his peg tube and has family at home to assist him. After he began tube feeds he had minor refeeding and phosphorous was repleted. He will follow up with the [facility] later this week.   Discharge Vitals:   BP 123/60 (BP Location: Right Arm)   Pulse 65   Temp 97.8 F (36.6 C) (Oral)   Resp 18   Ht '5\' 11"'$  (1.803 m)   Wt 65.5 kg   SpO2 100%   BMI 20.14 kg/m   Pertinent Labs, Studies, and Procedures:    EGD 2/27 The nasopharynx and oropharynx are abnormal. His swallowing issues are all stemming from his oropharynx and not his esophagus - Normal esophagus. - Normal stomach. - Normal duodenal bulb, first portion of the duodenum and second portion of the duodenum.     Discharge Instructions: Discharge Instructions    Diet - low sodium heart healthy   Complete by: As directed    Discharge instructions   Complete by: As directed    You were hospitalized for difficulty swallowing. Thank you for allowing [place] to be part of your care.   We arranged for you to follow up at:  [facility] They will call you Monday to schedule an appointment. If you do not here from them, please call [phone] to setup an appointment.    Please note these changes made to your medications:   Please  START taking:   Lasix 40 mg - take one tablet per day - if you notice increased swelling in your lower extremities or become short of breath you may need to increase back to twice a day. Please call your doctor if this occurs.   You may take your medications through your Peg tube except for Protonix and Flomax. These two medications will need to be taken by mouth with small sips of thickened liquid.   Please call our clinic if you have any questions or concerns, we may be able to help and keep you from a long and expensive emergency room wait. Our clinic and after hours phone number is [phone], the best time to call is Monday through Friday 9 am to 4 pm but there is always someone available 24/7 if you have an emergency. If you need medication refills  please notify your pharmacy one week in advance and they will send [place] a request.   Increase activity slowly   Complete by: As directed       Signed: Molli Hazard A, DO 12/01/2019, 8:15 AM   Pager: [phone]

## 2019-12-02 DIAGNOSIS — H35033 Hypertensive retinopathy, bilateral: Secondary | ICD-10-CM | POA: Diagnosis not present

## 2019-12-02 DIAGNOSIS — B309 Viral conjunctivitis, unspecified: Secondary | ICD-10-CM | POA: Diagnosis not present

## 2019-12-02 DIAGNOSIS — Z961 Presence of intraocular lens: Secondary | ICD-10-CM | POA: Diagnosis not present

## 2019-12-02 DIAGNOSIS — E113293 Type 2 diabetes mellitus with mild nonproliferative diabetic retinopathy without macular edema, bilateral: Secondary | ICD-10-CM | POA: Diagnosis not present

## 2019-12-03 ENCOUNTER — Ambulatory Visit (INDEPENDENT_AMBULATORY_CARE_PROVIDER_SITE_OTHER): Payer: Medicare HMO | Admitting: *Deleted

## 2019-12-03 ENCOUNTER — Telehealth: Payer: Self-pay | Admitting: *Deleted

## 2019-12-03 DIAGNOSIS — N183 Chronic kidney disease, stage 3 unspecified: Secondary | ICD-10-CM | POA: Diagnosis not present

## 2019-12-03 DIAGNOSIS — I130 Hypertensive heart and chronic kidney disease with heart failure and stage 1 through stage 4 chronic kidney disease, or unspecified chronic kidney disease: Secondary | ICD-10-CM | POA: Diagnosis not present

## 2019-12-03 DIAGNOSIS — Z95810 Presence of automatic (implantable) cardiac defibrillator: Secondary | ICD-10-CM | POA: Diagnosis not present

## 2019-12-03 DIAGNOSIS — I5022 Chronic systolic (congestive) heart failure: Secondary | ICD-10-CM | POA: Diagnosis not present

## 2019-12-03 DIAGNOSIS — N401 Enlarged prostate with lower urinary tract symptoms: Secondary | ICD-10-CM | POA: Diagnosis not present

## 2019-12-03 DIAGNOSIS — C320 Malignant neoplasm of glottis: Secondary | ICD-10-CM | POA: Diagnosis not present

## 2019-12-03 DIAGNOSIS — E1122 Type 2 diabetes mellitus with diabetic chronic kidney disease: Secondary | ICD-10-CM | POA: Diagnosis not present

## 2019-12-03 DIAGNOSIS — R338 Other retention of urine: Secondary | ICD-10-CM | POA: Diagnosis not present

## 2019-12-03 DIAGNOSIS — K5900 Constipation, unspecified: Secondary | ICD-10-CM | POA: Diagnosis not present

## 2019-12-03 DIAGNOSIS — R1312 Dysphagia, oropharyngeal phase: Secondary | ICD-10-CM | POA: Diagnosis not present

## 2019-12-03 LAB — CUP PACEART REMOTE DEVICE CHECK
Battery Remaining Longevity: 9 mo
Battery Voltage: 2.85 V
Brady Statistic AP VP Percent: 4.78 %
Brady Statistic AP VS Percent: 0.01 %
Brady Statistic AS VP Percent: 94.54 %
Brady Statistic AS VS Percent: 0.67 %
Brady Statistic RA Percent Paced: 4.73 %
Brady Statistic RV Percent Paced: 98.28 %
Date Time Interrogation Session: 20210310031604
HighPow Impedance: 40 Ohm
Implantable Lead Implant Date: 20140402
Implantable Lead Implant Date: 20140402
Implantable Lead Implant Date: 20140402
Implantable Lead Location: 753858
Implantable Lead Location: 753859
Implantable Lead Location: 753860
Implantable Lead Model: 4194
Implantable Lead Model: 5076
Implantable Lead Model: 6935
Implantable Pulse Generator Implant Date: 20140402
Lead Channel Impedance Value: 190 Ohm
Lead Channel Impedance Value: 304 Ohm
Lead Channel Impedance Value: 304 Ohm
Lead Channel Impedance Value: 361 Ohm
Lead Channel Impedance Value: 399 Ohm
Lead Channel Impedance Value: 399 Ohm
Lead Channel Pacing Threshold Amplitude: 0.75 V
Lead Channel Pacing Threshold Amplitude: 0.875 V
Lead Channel Pacing Threshold Amplitude: 0.875 V
Lead Channel Pacing Threshold Pulse Width: 0.4 ms
Lead Channel Pacing Threshold Pulse Width: 0.4 ms
Lead Channel Pacing Threshold Pulse Width: 0.6 ms
Lead Channel Sensing Intrinsic Amplitude: 12.75 mV
Lead Channel Sensing Intrinsic Amplitude: 12.75 mV
Lead Channel Sensing Intrinsic Amplitude: 2.375 mV
Lead Channel Sensing Intrinsic Amplitude: 2.375 mV
Lead Channel Setting Pacing Amplitude: 1.5 V
Lead Channel Setting Pacing Amplitude: 2 V
Lead Channel Setting Pacing Amplitude: 2.5 V
Lead Channel Setting Pacing Pulse Width: 0.4 ms
Lead Channel Setting Pacing Pulse Width: 0.6 ms
Lead Channel Setting Sensing Sensitivity: 0.3 mV

## 2019-12-03 NOTE — Telephone Encounter (Signed)
Scheduled transmission from 12/03/19 shows possible fluid accumulation since 11/25/19. Pt is followed in [facility], next ICM transmission on 12/22/19.  Attempted to reach patient, no answer at home or cell numbers, unable to LM.

## 2019-12-03 NOTE — Progress Notes (Signed)
ICD Remote  

## 2019-12-04 ENCOUNTER — Ambulatory Visit (INDEPENDENT_AMBULATORY_CARE_PROVIDER_SITE_OTHER): Payer: Medicare HMO | Admitting: Internal Medicine

## 2019-12-04 VITALS — BP 102/90 | HR 80 | Temp 98.3°F | Ht 68.0 in | Wt 142.6 lb

## 2019-12-04 DIAGNOSIS — C320 Malignant neoplasm of glottis: Secondary | ICD-10-CM

## 2019-12-04 DIAGNOSIS — E878 Other disorders of electrolyte and fluid balance, not elsewhere classified: Secondary | ICD-10-CM | POA: Diagnosis not present

## 2019-12-04 DIAGNOSIS — E1151 Type 2 diabetes mellitus with diabetic peripheral angiopathy without gangrene: Secondary | ICD-10-CM | POA: Diagnosis not present

## 2019-12-04 DIAGNOSIS — I5022 Chronic systolic (congestive) heart failure: Secondary | ICD-10-CM

## 2019-12-04 DIAGNOSIS — Z931 Gastrostomy status: Secondary | ICD-10-CM

## 2019-12-04 DIAGNOSIS — Z79899 Other long term (current) drug therapy: Secondary | ICD-10-CM

## 2019-12-04 DIAGNOSIS — Z794 Long term (current) use of insulin: Secondary | ICD-10-CM

## 2019-12-04 DIAGNOSIS — Z923 Personal history of irradiation: Secondary | ICD-10-CM

## 2019-12-04 MED ORDER — INSULIN GLARGINE 100 UNIT/ML ~~LOC~~ SOLN: 8.0000 [IU] | Freq: Every day | SUBCUTANEOUS | 11 refills | Status: AC

## 2019-12-04 MED ORDER — FUROSEMIDE 40 MG PO TABS: 40.0000 mg | ORAL_TABLET | Freq: Two times a day (BID) | ORAL | 3 refills | Status: DC

## 2019-12-04 NOTE — Progress Notes (Signed)
error 

## 2019-12-04 NOTE — Assessment & Plan Note (Signed)
Patient denies shortness of breath, orthopnea, PND.  Patient was previously on Lasix 40 mg twice daily, was decreased to Lasix once daily following hospital discharge.  Given lower extremity swelling, will increase dosage back to 40 mg twice daily. Stable renal function and electrolytes on last BMP on 11/30/19  Plan: *Lasix 40 mg twice daily *BMP

## 2019-12-04 NOTE — Patient Instructions (Addendum)
You were seen for followup after your hospitalization. Here are my recommendations:  1) Please start taking lasix 40 mg twice daily. This should help improve the swelling in your legs. You can also get compression stockings from the drug store which can help both with the swelling and the wounds on your legs.  2) Please keep doing your tube feeds as before. Please followup with your speech therapist and they can recommend when you can start eating by mouth again  Thank you for allowing [place] to be part of your medical care!

## 2019-12-04 NOTE — Progress Notes (Signed)
[facility] Attending  Case discussed with Dr. MacLean at the time of the visit.  We reviewed the resident's history and exam and pertinent patient test results.  I agree with the assessment, diagnosis, and plan of care documented in the resident's note.  Latitia Housewright, M.D., Ph.D.  

## 2019-12-04 NOTE — Progress Notes (Signed)
   CC: Hospital followup  HPI: Patient is an 86 year old male with past medical history as below who presents for follow-up after hospitalization for dysphagia.  [name] is a 86 y.o.   Past Medical History:  Diagnosis Date  . Adenomatous colon polyp 02/14/2012  . AICD (automatic cardioverter/defibrillator) present    Medtronic   . BBB (bundle branch block)    right  . Carotid stenosis    a. s/p L carotid stent 2004;  b. Carotid US (09/2014): Bilateral ICA 1-39%, left ECA >59%, normal subclavian bilaterally, occluded left vertebral >> FU 2 years  . Chronic kidney disease   . Chronic systolic CHF (congestive heart failure) (HCC)    a. ischemic CM EF 15-20%;  b. s/p AICD 05/24/04;  c. Echo 7/06: EF 30-40%, mild reduced RVSF, d. Echo 12/2015 EF 35-40%  . Elevated PSA   . History of kidney stones    x 1  . HTN (hypertension)   . Hyperlipidemia   . ICD (implantable cardiac defibrillator) in place 12-25-2012   MDT CRTD upgrade by Dr Lovena Le  . Myocardial infarction ([place]) 1990  . Pericardial effusion    Echocardiogram (09/2014): EF 25% with distal anterior, distal inferior, distal lateral and apical akinesis, grade 1 diastolic dysfunction, very mild aortic stenosis (mean 7 mmHg) - this may be depressed due to low EF (2-D images suggest mild to moderate aortic stenosis), large pericardial effusion, no RA collapse  . Pneumonia   . Presence of permanent cardiac pacemaker    Medtronic  . PVD (peripheral vascular disease) ([place])    s/p L carotid PTCA/stent 2004  . Transient ischemic attack   . Type II diabetes mellitus ([place])    type II   Review of Systems:   Review of Systems  Constitutional: Negative for chills and fever.  Respiratory: Negative for shortness of breath.   Cardiovascular: Negative for chest pain, orthopnea and PND.  All other systems reviewed and are negative.   Physical Exam:  Vitals:   12/04/19 1059  BP: 102/90  Pulse: 80  Temp: 98.3 F (36.8 C)   TempSrc: Oral  SpO2: 100%  Weight: 142 lb 9.6 oz (64.7 kg)  Height: 5\' 8"  (1.727 m)   Physical Exam  Constitutional: He is well-developed, well-nourished, and in no distress.  HENT:  Head: Normocephalic and atraumatic.  Eyes: EOM are normal. Right eye exhibits no discharge. Left eye exhibits no discharge.  Neck: No tracheal deviation present.  Cardiovascular: Normal rate and regular rhythm. Exam reveals no gallop and no friction rub.  No murmur heard. Pulmonary/Chest: Effort normal and breath sounds normal. No respiratory distress. He has no wheezes. He has no rales.  Abdominal: Soft. He exhibits no distension. There is no abdominal tenderness. There is no rebound and no guarding.  Musculoskeletal:        General: Edema (2+ pitting edema up to mid-shins bilaterally) present. No tenderness or deformity. Normal range of motion.     Cervical back: Normal range of motion.  Neurological: He is alert. Coordination normal.  Skin: Skin is warm and dry. No rash noted. He is not diaphoretic. No erythema.  Psychiatric: Memory and judgment normal.     Assessment & Plan:   See Encounters Tab for problem based charting.  Patient discussed with Dr. Rebeca Alert

## 2019-12-04 NOTE — Assessment & Plan Note (Signed)
Patient reports measuring blood sugar twice daily (morning and evening). He takes feeding supplements per tube 5 times daily, no PO intake. Patient states majority of measurements are in 130s and 140s. Highest value has been 193 but he states this is atypical.  Plan: *Continue taking lantus 8 units daily

## 2019-12-04 NOTE — Assessment & Plan Note (Addendum)
Patient is now status post radiation therapy, finished at the end of February.  He was hospitalized from 11/20/2019 through 11/30/2019 for dysphagia.  EGD was performed without acute finding, ENT evaluated and found patient to have immobile hemilarynx with edema secondary to radiation.  Barium swallow revealed significant aspiration, IR placed PEG on 11/27/19. Patient had mild refeeding syndrome with low phosphorus which was repleted.  Plan:  * Continue feedings per PEG tube as before * Recheck BMP and phosphorus today * Patient to followup with speech therapy at cancer center. They will help direct decision on re-initiation of PO intake

## 2019-12-05 ENCOUNTER — Ambulatory Visit
Admission: RE | Admit: 2019-12-05 | Discharge: 2019-12-05 | Disposition: A | Discharge status: Home / Self Care | Payer: Medicare HMO | Source: Ambulatory Visit | Attending: Radiation Oncology | Admitting: Radiation Oncology

## 2019-12-05 ENCOUNTER — Telehealth: Payer: Self-pay | Admitting: *Deleted

## 2019-12-05 ENCOUNTER — Other Ambulatory Visit: Payer: Self-pay

## 2019-12-05 ENCOUNTER — Encounter (HOSPITAL_COMMUNITY): Payer: Self-pay | Admitting: Pediatrics

## 2019-12-05 ENCOUNTER — Inpatient Hospital Stay (HOSPITAL_COMMUNITY)
Admission: EM | Admit: 2019-12-05 | Discharge: 2019-12-08 | DRG: 640 | Disposition: A | Discharge status: Home Health | Payer: Medicare HMO | Attending: Internal Medicine | Admitting: Internal Medicine

## 2019-12-05 ENCOUNTER — Ambulatory Visit: Payer: Medicare HMO

## 2019-12-05 ENCOUNTER — Other Ambulatory Visit: Payer: Self-pay | Admitting: Internal Medicine

## 2019-12-05 DIAGNOSIS — E1122 Type 2 diabetes mellitus with diabetic chronic kidney disease: Secondary | ICD-10-CM | POA: Diagnosis present

## 2019-12-05 DIAGNOSIS — I130 Hypertensive heart and chronic kidney disease with heart failure and stage 1 through stage 4 chronic kidney disease, or unspecified chronic kidney disease: Secondary | ICD-10-CM | POA: Diagnosis present

## 2019-12-05 DIAGNOSIS — Z833 Family history of diabetes mellitus: Secondary | ICD-10-CM | POA: Diagnosis not present

## 2019-12-05 DIAGNOSIS — Z6821 Body mass index (BMI) 21.0-21.9, adult: Secondary | ICD-10-CM | POA: Diagnosis not present

## 2019-12-05 DIAGNOSIS — N400 Enlarged prostate without lower urinary tract symptoms: Secondary | ICD-10-CM | POA: Diagnosis present

## 2019-12-05 DIAGNOSIS — E1140 Type 2 diabetes mellitus with diabetic neuropathy, unspecified: Secondary | ICD-10-CM | POA: Diagnosis not present

## 2019-12-05 DIAGNOSIS — E1142 Type 2 diabetes mellitus with diabetic polyneuropathy: Secondary | ICD-10-CM | POA: Diagnosis present

## 2019-12-05 DIAGNOSIS — I5022 Chronic systolic (congestive) heart failure: Secondary | ICD-10-CM | POA: Diagnosis not present

## 2019-12-05 DIAGNOSIS — Z79899 Other long term (current) drug therapy: Secondary | ICD-10-CM | POA: Diagnosis not present

## 2019-12-05 DIAGNOSIS — I2510 Atherosclerotic heart disease of native coronary artery without angina pectoris: Secondary | ICD-10-CM | POA: Diagnosis present

## 2019-12-05 DIAGNOSIS — C320 Malignant neoplasm of glottis: Secondary | ICD-10-CM | POA: Diagnosis present

## 2019-12-05 DIAGNOSIS — Z955 Presence of coronary angioplasty implant and graft: Secondary | ICD-10-CM | POA: Diagnosis not present

## 2019-12-05 DIAGNOSIS — E875 Hyperkalemia: Secondary | ICD-10-CM | POA: Diagnosis not present

## 2019-12-05 DIAGNOSIS — R1310 Dysphagia, unspecified: Secondary | ICD-10-CM | POA: Diagnosis present

## 2019-12-05 DIAGNOSIS — E43 Unspecified severe protein-calorie malnutrition: Secondary | ICD-10-CM | POA: Diagnosis present

## 2019-12-05 DIAGNOSIS — Z8673 Personal history of transient ischemic attack (TIA), and cerebral infarction without residual deficits: Secondary | ICD-10-CM

## 2019-12-05 DIAGNOSIS — I252 Old myocardial infarction: Secondary | ICD-10-CM | POA: Diagnosis not present

## 2019-12-05 DIAGNOSIS — I10 Essential (primary) hypertension: Secondary | ICD-10-CM | POA: Diagnosis not present

## 2019-12-05 DIAGNOSIS — E785 Hyperlipidemia, unspecified: Secondary | ICD-10-CM | POA: Diagnosis not present

## 2019-12-05 DIAGNOSIS — E1151 Type 2 diabetes mellitus with diabetic peripheral angiopathy without gangrene: Secondary | ICD-10-CM | POA: Diagnosis not present

## 2019-12-05 DIAGNOSIS — I255 Ischemic cardiomyopathy: Secondary | ICD-10-CM | POA: Diagnosis present

## 2019-12-05 DIAGNOSIS — Z931 Gastrostomy status: Secondary | ICD-10-CM

## 2019-12-05 DIAGNOSIS — Z7189 Other specified counseling: Secondary | ICD-10-CM

## 2019-12-05 DIAGNOSIS — R64 Cachexia: Secondary | ICD-10-CM | POA: Diagnosis not present

## 2019-12-05 DIAGNOSIS — N183 Chronic kidney disease, stage 3 unspecified: Secondary | ICD-10-CM | POA: Diagnosis not present

## 2019-12-05 DIAGNOSIS — Z95810 Presence of automatic (implantable) cardiac defibrillator: Secondary | ICD-10-CM | POA: Diagnosis not present

## 2019-12-05 DIAGNOSIS — Z923 Personal history of irradiation: Secondary | ICD-10-CM | POA: Diagnosis not present

## 2019-12-05 DIAGNOSIS — Z20822 Contact with and (suspected) exposure to covid-19: Secondary | ICD-10-CM | POA: Diagnosis present

## 2019-12-05 LAB — BMP8+ANION GAP
Anion Gap: 10 mmol/L (ref 10.0–18.0)
BUN/Creatinine Ratio: 19 (ref 10–24)
BUN: 31 mg/dL — ABNORMAL HIGH (ref 8–27)
CO2: 26 mmol/L (ref 20–29)
Calcium: 9.6 mg/dL (ref 8.6–10.2)
Chloride: 102 mmol/L (ref 96–106)
Creatinine, Ser: 1.66 mg/dL — ABNORMAL HIGH (ref 0.76–1.27)
GFR calc Af Amer: 43 mL/min/{1.73_m2} — ABNORMAL LOW (ref >59)
GFR calc non Af Amer: 37 mL/min/{1.73_m2} — ABNORMAL LOW (ref >59)
Glucose: 286 mg/dL — ABNORMAL HIGH (ref 65–99)
Potassium: 6.5 mmol/L — ABNORMAL HIGH (ref 3.5–5.2)
Sodium: 138 mmol/L (ref 134–144)

## 2019-12-05 LAB — BASIC METABOLIC PANEL
Anion gap: 8 (ref 5–15)
Anion gap: 7 (ref 5–15)
BUN: 33 mg/dL — ABNORMAL HIGH (ref 8–23)
BUN: 34 mg/dL — ABNORMAL HIGH (ref 8–23)
CO2: 27 mmol/L (ref 22–32)
CO2: 25 mmol/L (ref 22–32)
Calcium: 9.3 mg/dL (ref 8.9–10.3)
Calcium: 9.3 mg/dL (ref 8.9–10.3)
Chloride: 102 mmol/L (ref 98–111)
Chloride: 106 mmol/L (ref 98–111)
Creatinine, Ser: 1.62 mg/dL — ABNORMAL HIGH (ref 0.61–1.24)
Creatinine, Ser: 1.74 mg/dL — ABNORMAL HIGH (ref 0.61–1.24)
GFR calc Af Amer: 44 mL/min — ABNORMAL LOW (ref >60)
GFR calc Af Amer: 40 mL/min — ABNORMAL LOW (ref >60)
GFR calc non Af Amer: 38 mL/min — ABNORMAL LOW (ref >60)
GFR calc non Af Amer: 35 mL/min — ABNORMAL LOW (ref >60)
Glucose, Bld: 315 mg/dL — ABNORMAL HIGH (ref 70–99)
Glucose, Bld: 191 mg/dL — ABNORMAL HIGH (ref 70–99)
Potassium: 6.3 mmol/L (ref 3.5–5.1)
Potassium: 6.2 mmol/L — ABNORMAL HIGH (ref 3.5–5.1)
Sodium: 137 mmol/L (ref 135–145)
Sodium: 138 mmol/L (ref 135–145)

## 2019-12-05 LAB — URINALYSIS, ROUTINE W REFLEX MICROSCOPIC
Bilirubin Urine: NEGATIVE
Glucose, UA: >=500 mg/dL — AB
Hgb urine dipstick: NEGATIVE
Ketones, ur: NEGATIVE mg/dL
Leukocytes,Ua: NEGATIVE
Nitrite: NEGATIVE
Protein, ur: 30 mg/dL — AB
Specific Gravity, Urine: 1.013 (ref 1.005–1.030)
pH: 9 — ABNORMAL HIGH (ref 5.0–8.0)

## 2019-12-05 LAB — CBG MONITORING, ED
Glucose-Capillary: 53 mg/dL — ABNORMAL LOW (ref 70–99)
Glucose-Capillary: 182 mg/dL — ABNORMAL HIGH (ref 70–99)

## 2019-12-05 LAB — CBC
HCT: 35.7 % — ABNORMAL LOW (ref 39.0–52.0)
Hemoglobin: 10.7 g/dL — ABNORMAL LOW (ref 13.0–17.0)
MCH: 29.5 pg (ref 26.0–34.0)
MCHC: 30 g/dL (ref 30.0–36.0)
MCV: 98.3 fL (ref 80.0–100.0)
Platelets: 149 10*3/uL — ABNORMAL LOW (ref 150–400)
RBC: 3.63 MIL/uL — ABNORMAL LOW (ref 4.22–5.81)
RDW: 14.4 % (ref 11.5–15.5)
WBC: 7.9 10*3/uL (ref 4.0–10.5)
nRBC: 0 % (ref 0.0–0.2)

## 2019-12-05 LAB — GLUCOSE, CAPILLARY: Glucose-Capillary: 159 mg/dL — ABNORMAL HIGH (ref 70–99)

## 2019-12-05 LAB — PHOSPHORUS: Phosphorus: 2.5 mg/dL — ABNORMAL LOW (ref 2.8–4.1)

## 2019-12-05 MED ORDER — DEXTROSE 5 % IV BOLUS
250.0000 mL | Freq: Once | INTRAVENOUS | Status: AC
  Administered 2019-12-05: 250 mL via INTRAVENOUS

## 2019-12-05 MED ORDER — SODIUM CHLORIDE 0.9% FLUSH: 3.0000 mL | Freq: Once | INTRAVENOUS | Status: DC

## 2019-12-05 MED ORDER — SODIUM ZIRCONIUM CYCLOSILICATE 10 G PO PACK
10.0000 g | PACK | Freq: Once | ORAL | Status: AC
  Administered 2019-12-05: 10 g via ORAL
  Filled 2019-12-05: qty 1

## 2019-12-05 MED ORDER — ALBUTEROL SULFATE (2.5 MG/3ML) 0.083% IN NEBU: 2.5000 mg | INHALATION_SOLUTION | Freq: Four times a day (QID) | RESPIRATORY_TRACT | Status: DC | PRN

## 2019-12-05 MED ORDER — OSMOLITE 1.5 CAL PO LIQD
150.0000 mL | Freq: Every day | ORAL | Status: DC
  Administered 2019-12-05 – 2019-12-07 (×7): 237 mL
  Filled 2019-12-05 (×13): qty 237

## 2019-12-05 MED ORDER — SODIUM CHLORIDE 0.9 % IV BOLUS
500.0000 mL | Freq: Once | INTRAVENOUS | Status: AC
  Administered 2019-12-05: 500 mL via INTRAVENOUS

## 2019-12-05 MED ORDER — CARVEDILOL 12.5 MG PO TABS: 6.2500 mg | ORAL_TABLET | Freq: Two times a day (BID) | ORAL | Status: DC

## 2019-12-05 MED ORDER — FUROSEMIDE 40 MG PO TABS
40.0000 mg | ORAL_TABLET | Freq: Two times a day (BID) | ORAL | Status: DC
  Administered 2019-12-06 – 2019-12-08 (×5): 40 mg
  Filled 2019-12-05 (×5): qty 1

## 2019-12-05 MED ORDER — HEPARIN SODIUM (PORCINE) 5000 UNIT/ML IJ SOLN
5000.0000 [IU] | Freq: Three times a day (TID) | INTRAMUSCULAR | Status: DC
  Administered 2019-12-05 – 2019-12-08 (×8): 5000 [IU] via SUBCUTANEOUS
  Filled 2019-12-05 (×8): qty 1

## 2019-12-05 MED ORDER — INSULIN ASPART 100 UNIT/ML IV SOLN
10.0000 [IU] | Freq: Once | INTRAVENOUS | Status: AC
  Administered 2019-12-05: 10 [IU] via INTRAVENOUS

## 2019-12-05 MED ORDER — CARVEDILOL 6.25 MG PO TABS
6.2500 mg | ORAL_TABLET | Freq: Two times a day (BID) | ORAL | Status: DC
  Administered 2019-12-06 – 2019-12-08 (×4): 6.25 mg via ORAL
  Filled 2019-12-05 (×5): qty 1

## 2019-12-05 MED ORDER — INSULIN ASPART 100 UNIT/ML ~~LOC~~ SOLN
0.0000 [IU] | SUBCUTANEOUS | Status: DC
  Administered 2019-12-05: 0.9 [IU] via SUBCUTANEOUS
  Administered 2019-12-05: 2 [IU] via SUBCUTANEOUS
  Administered 2019-12-06 (×2): 3 [IU] via SUBCUTANEOUS

## 2019-12-05 MED ORDER — FUROSEMIDE 10 MG/ML IJ SOLN
40.0000 mg | Freq: Once | INTRAMUSCULAR | Status: AC
  Administered 2019-12-05: 40 mg via INTRAVENOUS
  Filled 2019-12-05: qty 4

## 2019-12-05 MED ORDER — ATORVASTATIN CALCIUM 40 MG PO TABS
40.0000 mg | ORAL_TABLET | Freq: Every day | ORAL | Status: DC
  Administered 2019-12-06 – 2019-12-08 (×3): 40 mg via ORAL
  Filled 2019-12-05 (×3): qty 1

## 2019-12-05 MED ORDER — PRO-STAT SUGAR FREE PO LIQD
30.0000 mL | Freq: Every day | ORAL | Status: DC
  Administered 2019-12-05 – 2019-12-08 (×4): 30 mL
  Filled 2019-12-05 (×4): qty 30

## 2019-12-05 MED ORDER — TAMSULOSIN HCL 0.4 MG PO CAPS
0.8000 mg | ORAL_CAPSULE | Freq: Every day | ORAL | Status: DC
  Administered 2019-12-06 – 2019-12-07 (×2): 0.8 mg via ORAL
  Filled 2019-12-05 (×2): qty 2

## 2019-12-05 NOTE — ED Notes (Signed)
The pt  Reports that he is cold and wet  He dropped his urinal

## 2019-12-05 NOTE — H&P (Addendum)
Date: 12/05/2019               Patient Name:  [name] MRN: [id number]  DOB: 1931-04-11 Age / Sex: 86 y.o., male   PCP: Axel Filler, MD              Medical Service: Internal Medicine Teaching Service              Attending Physician: Dr. Lucious Groves, DO    First Contact: Robert Spray, MS Pager: [phone]  Second Contact: Dr. Charleen Kirks Pager: [phone]  Third Contact Dr. Eileen Stanford  Pager: [phone]       After Hours (After 5p/  First Contact Pager: [phone]  weekends / holidays): Second Contact Pager: [phone]   Chief Complaint: Increased potassium levels  History of Present Illness: [name] is a 86 year old male with a pertinent past medical history of type 2 diabetes, [name] III, urinary retention, malignant neoplasm of glottis, and HFrEF with EF=25% (09/2014) with a cardioverter-defibrillator who presented to the ED today after his lab work from an outpatient visit yesterday revealed a potassium level of 6.5. Yesterday [name] went to a follow up visit for a recent hospitalization for dysphagia due to an immobile hemilayrnx with edema secondary to radiation. During this visit the patient reported having higher than normal blood sugar levels and edema was present on exam and Lasix dosage was increased to '40mg'$  2x/day. Routine lab work was drawn, which revealed the high potassium level when results were returned today. The patient was then informed to come to the ED. In the ED he was given [facility] to help bring his potassium level down.  He reports no symptoms from his hyperkalemia. He denies any muscle weakness, palpitations, abdominal pain, increased thirst, and changes in cognition. He does report increased urinary frequency.   Meds:   Current Facility-Administered Medications:  .  albuterol (VENTOLIN HFA) 108 (90 Base) MCG/ACT inhaler 2 puff, 2 puff, Inhalation, Q6H PRN, Eileen Stanford, Caprice Kluver, MD .  Derrill Memo ON 12/06/2019] atorvastatin (LIPITOR) tablet 40 mg, 40 mg,  Oral, Daily, Dairon Procter K, MD .  carvedilol (COREG) tablet 6.25 mg, 6.25 mg, Oral, BID WC, Jalene Lacko K, MD .  feeding supplement (OSMOLITE 1.5 CAL) liquid 150-237 mL, 150-237 mL, Per Tube, 5 X Daily, Holley Kocurek K, MD .  feeding supplement (PRO-STAT SUGAR FREE 64) liquid 30 mL, 30 mL, Per Tube, Daily, Savayah Waltrip K, MD .  Derrill Memo ON 12/06/2019] furosemide (LASIX) tablet 40 mg, 40 mg, Per Tube, BID, Catie Chiao K, MD .  heparin injection 5,000 Units, 5,000 Units, Subcutaneous, Q8H, Benisha Hadaway K, MD .  insulin aspart (novoLOG) injection 0-9 Units, 0-9 Units, Subcutaneous, Q4H, Kadeisha Betsch K, MD .  sodium chloride 0.9 % bolus 500 mL, 500 mL, Intravenous, Once, Sergio Zawislak K, MD .  sodium chloride flush (NS) 0.9 % injection 3 mL, 3 mL, Intravenous, Once, Alazne Quant K, MD .  Derrill Memo ON 12/06/2019] tamsulosin (FLOMAX) capsule 0.8 mg, 0.8 mg, Oral, QHS, Jiovany Scheffel K, MD  Current Outpatient Medications:  .  Amino Acids-Protein Hydrolys (FEEDING SUPPLEMENT, PRO-STAT SUGAR FREE 64,) LIQD, Place 30 mLs into feeding tube daily. (Patient taking differently: Take 30 mLs by mouth at bedtime. ), Disp: 887 mL, Rfl: 0 .  atorvastatin (LIPITOR) 40 MG tablet, TAKE 1 TABLET(40 MG) BY MOUTH DAILY AT 6 PM (Patient taking differently: Take 40 mg by mouth daily. ), Disp: 90 tablet, Rfl: 3 .  carvedilol (COREG) 6.25  MG tablet, PLACE 1 TABLET INTO FEEDING TUBE TWICE DAILY WITH A MEAL (Patient taking differently: Take 6.25 mg by mouth 2 (two) times daily with a meal. ), Disp: 180 tablet, Rfl: 1 .  Nutritional Supplements (FEEDING SUPPLEMENT, OSMOLITE 1.5 CAL,) LIQD, Place 150-237 mLs into feeding tube 5 (five) times daily. (Patient taking differently: Place 237 mLs into feeding tube 5 (five) times daily. ), Disp: 1000 mL, Rfl: 5 .  Blood Glucose Monitoring Suppl (ACCU-CHEK AVIVA PLUS) w/Device KIT, Check finger stick glucose once daily, Disp: 1 kit, Rfl: 0 .  fluorometholone (FML) 0.1 % ophthalmic suspension, Place 2 drops  into both eyes 2 (two) times daily., Disp: , Rfl:  .  furosemide (LASIX) 40 MG tablet, Place 1 tablet (40 mg total) into feeding tube 2 (two) times daily., Disp: 180 tablet, Rfl: 3 .  glucose blood (ACCU-CHEK AVIVA PLUS) test strip, Check blood sugar up to 3 times a day, Disp: 300 each, Rfl: 5 .  Hydrocortisone, Perianal, (PROCTO-PAK) 1 % CREA, Apply 1 application topically 2 (two) times daily. (Patient not taking: Reported on 11/20/2019), Disp: 28 g, Rfl: 0 .  insulin glargine (LANTUS) 100 UNIT/ML injection, Inject 0.08 mLs (8 Units total) into the skin daily., Disp: 10 mL, Rfl: 11 .  Insulin Pen Needle (B-D ULTRAFINE III SHORT PEN) 31G X 8 MM MISC, USE ONCE DAILY WITH LANTUS PEN, Disp: 100 each, Rfl: 3 .  lidocaine (XYLOCAINE) 2 % solution, Patient: Mix 1part 2% viscous lidocaine, 1part H20. Swallow 20m of diluted mixture, 366m before meals and at bedtime, up to QID, Disp: 200 mL, Rfl: 3 .  ofloxacin (OCUFLOX) 0.3 % ophthalmic solution, Place 1 drop into both eyes 4 (four) times daily. , Disp: , Rfl:  .  pantoprazole (PROTONIX) 40 MG tablet, Take 1 tablet (40 mg total) by mouth daily., Disp: 90 tablet, Rfl: 0 .  polyethylene glycol (MIRALAX / GLYCOLAX) 17 g packet, Take 17 g by mouth daily., Disp: 14 each, Rfl: 0 .  senna-docusate (SENOKOT-S) 8.6-50 MG tablet, Place 2 tablets into feeding tube daily. (Patient taking differently: Take 2 tablets by mouth daily. ), Disp: 30 tablet, Rfl: 2 .  tamsulosin (FLOMAX) 0.4 MG CAPS capsule, Take 0.8 mg by mouth at bedtime. , Disp: , Rfl:  .  VENTOLIN HFA 108 (90 Base) MCG/ACT inhaler, INHALE 2 PUFFS INTO THE LUNGS EVERY 6 HOURS AS NEEDED FOR WHEEZING OR SHORTNESS OF BREATH (Patient taking differently: Inhale 2 puffs into the lungs every 6 (six) hours as needed for wheezing or shortness of breath. ), Disp: 18 g, Rfl: 5    Allergies: Allergies as of 12/05/2019  . (No Known Allergies)   Past Medical History:  Diagnosis Date  . Adenomatous colon polyp  02/14/2012  . AICD (automatic cardioverter/defibrillator) present    Medtronic   . BBB (bundle branch block)    right  . Carotid stenosis    a. s/p L carotid stent 2004;  b. Carotid [place]): Bilateral ICA 1-39%, left ECA >59%, normal subclavian bilaterally, occluded left vertebral >> FU 2 years  . Chronic kidney disease   . Chronic systolic CHF (congestive heart failure) (HCC)    a. ischemic CM EF 15-20%;  b. s/p AICD 05/24/04;  c. Echo 7/06: EF 30-40%, mild reduced RVSF, d. Echo 12/2015 EF 35-40%  . Elevated PSA   . History of kidney stones    x 1  . HTN (hypertension)   . Hyperlipidemia   . ICD (implantable cardiac defibrillator) in  place 12-25-2012   MDT CRTD upgrade by Dr Lovena Le  . Myocardial infarction ([place]) 1990  . Pericardial effusion    Echocardiogram (09/2014): EF 25% with distal anterior, distal inferior, distal lateral and apical akinesis, grade 1 diastolic dysfunction, very mild aortic stenosis (mean 7 mmHg) - this may be depressed due to low EF (2-D images suggest mild to moderate aortic stenosis), large pericardial effusion, no RA collapse  . Pneumonia   . Presence of permanent cardiac pacemaker    Medtronic  . PVD (peripheral vascular disease) ([place])    s/p L carotid PTCA/stent 2004  . Transient ischemic attack   . Type II diabetes mellitus ([place])    type II    Family History:  Family History  Problem Relation Age of Onset  . Diabetes Mother   . Healthy Father   . Diabetes Brother   . Heart attack Neg Hx   . Stroke Neg Hx     Social History:  The patient lives at home alone in his apartment. He feels safe in this environment. He reports no difficulty in remember to take his medications as prescribed. He reports no difficulty obtaining his medications either.   Review of Systems: A complete ROS was negative except as per HPI.   Physical Exam: Blood pressure 139/77, pulse 70, temperature 98.3 F (36.8 C), temperature source Oral, resp. rate 19, height _0   (1.727 m), weight 64.7 kg, SpO2 99 %. Physical Exam Cardiovascular:     Rate and Rhythm: Normal rate and regular rhythm.     Pulses: Normal pulses.  Pulmonary:     Effort: Pulmonary effort is normal.     Breath sounds: Normal breath sounds.  Abdominal:     General: Abdomen is flat.     Tenderness: There is no abdominal tenderness.     Comments: Peg tube present with no signs of irritation or infection  Musculoskeletal:        General: Swelling present.     Right lower leg: 2+ Pitting Edema present.     Left lower leg: 2+ Pitting Edema present.     Comments: 2+ pitting edema present to height of knees  Skin:    General: Skin is warm and dry.     Findings: Erythema and rash present. Rash is crusting.     Comments: Oozing rash present bilaterally on shins   Neurological:     General: No focal deficit present.     Mental Status: He is alert and oriented to person, place, and time.     Sensory: No sensory deficit.  Psychiatric:        Mood and Affect: Mood normal.        Behavior: Behavior normal.    BMP Latest Ref Rng & Units 12/05/2019 12/05/2019 12/04/2019  Glucose 70 - 99 mg/dL 191(H) 315(H) 286(H)  BUN 8 - 23 mg/dL 34(H) 33(H) 31(H)  Creatinine 0.61 - 1.24 mg/dL 1.74(H) 1.62(H) 1.66(H)  BUN/Creat Ratio 10 - 24 - - 19  Sodium 135 - 145 mmol/L 138 137 138  Potassium 3.5 - 5.1 mmol/L 6.2(H) 6.3(HH) 6.5(H)  Chloride 98 - 111 mmol/L 106 102 102  CO2 22 - 32 mmol/L _1 Calcium 8.9 - 10.3 mg/dL 9.3 9.3 9.6   CBC Latest Ref Rng & Units 12/05/2019 11/28/2019 11/27/2019  WBC 4.0 - 10.5 K/uL 7.9 7.8 7.1  Hemoglobin 13.0 - 17.0 g/dL 10.7(L) 12.0(L) 11.0(L)  Hematocrit 39.0 - 52.0 % 35.7(L) 38.6(L) 35.4(L)  Platelets 150 -  400 K/uL 149(L) 199 138(L)   Urinalysis    Component Value Date/Time   COLORURINE YELLOW 12/05/2019 1600   APPEARANCEUR CLEAR 12/05/2019 1600   LABSPEC 1.013 12/05/2019 1600   PHURINE 9.0 (H) 12/05/2019 1600   GLUCOSEU >=500 (A) 12/05/2019 1600   GLUCOSEU  500 (A) 05/12/2009 2130   HGBUR NEGATIVE 12/05/2019 1600   BILIRUBINUR NEGATIVE 12/05/2019 1600   KETONESUR NEGATIVE 12/05/2019 1600   PROTEINUR 30 (A) 12/05/2019 1600   UROBILINOGEN 1.0 01/16/2015 1630   NITRITE NEGATIVE 12/05/2019 1600   LEUKOCYTESUR NEGATIVE 12/05/2019 1600    Ref Range & Units 1 d ago 5 d ago 6 d ago  Phosphorus 2.8 - 4.1 mg/dL 2.5Low   3.1 R, CM  2.4Low  R, CM      EKG: personally reviewed my interpretation is regular rate, atrial sensed ventricular paced rhythm from pacemaker  CXR: not done   Assessment & Plan by Problem: Principal Problem:   Hyperkalemia Active Problems:   Type 2 diabetes mellitus with peripheral vascular disease (HCC)   Hyperlipidemia   Chronic systolic congestive heart failure (HCC)   Automatic implantable cardioverter-defibrillator in situ   [name] (chronic kidney disease) stage 3, GFR 30-59 ml/min (HCC)   Advanced care planning/counseling discussion   Enlarged prostate   Malignant neoplasm of glottis (HCC)   Protein-calorie malnutrition, severe  Summary:  Jewell Ryans is a 86 year old male with a past medical history of type 2 diabetes, [name] III, malignant neoplasm of glottis, and HFrEF with EF=25% (09/2014) with a cardioverter-defibrillator who presented to the ED after outpatient laboratory work-up reviewed hyperkalemia (K= 6.5).   Hyperkalemia: There is no clear etiology for his hyperkalemia.  Urinalysis is not convincing for rhabdomyolysis.  He does have a history of [name] stage III and with this patient radiation therapy, could be a possibility this could be related to myositis damage secreting potassium.  EKG does not reveal peaked T waves.  S/p NS 500cc, IV Lasix 40, Lokelma and Novolog 10 units. UA not convincing for rhabdomyolysis.  - monitor BMP - continue lasix 67m BID - continue lokelma as needed   Hyperlipidemia: - Continue Lipitor    T2DM: The patient has had a recent history of high blood sugar levels in the  130s-140s with the highest being 193 at home. In the ED his blood glucose level was 315, and yesterday it was 286 - Continue tube feeds - Continue SSI-thin q4hr for now - monitor blood glucose level   [name] stage III: Serum creatinine on admission 1.7 (baseline 1.4-2.2) -Continue to monitor   DVT ppx: - heparin per pharmacy dosing    FEN:  - Tube feeding    Dispo: Admit patient to Observation with expected length of stay less than 2 midnights.  Signed: AJean Rosenthal MD 12/05/2019, 6:07 PM  Pager: [phone]

## 2019-12-05 NOTE — Progress Notes (Addendum)
BMP Latest Ref Rng & Units 12/04/2019 11/30/2019 11/29/2019  Glucose 65 - 99 mg/dL 286(H) 134(H) 158(H)  BUN 8 - 27 mg/dL 31(H) 27(H) 24(H)  Creatinine 0.76 - 1.27 mg/dL 1.66(H) 1.50(H) 1.45(H)  BUN/Creat Ratio 10 - 24 19 - -  Sodium 134 - 144 mmol/L 138 142 140  Potassium 3.5 - 5.2 mmol/L 6.5(H) 4.8 4.6  Chloride 96 - 106 mmol/L 102 109 109  CO2 20 - 29 mmol/L 26 24 25   Calcium 8.6 - 10.2 mg/dL 9.6 8.7(L) 8.9   Phosphorus: 2.5  Potassium of 6.5 on BMP - no indication that this sample is hemolyzed. Unable to reach patient but spoke with sister who states she can get in contact with patient and tell him to go to emergency room. I spoke with ER charge nurse to notify of this plan.  Patient also with mildly low phosphorus at 2.5, likely secondary to refeeding syndrome which can be non-emergently repleted.  Jeanmarie Hubert, MD 12/05/2019, 9:12 AM

## 2019-12-05 NOTE — ED Notes (Signed)
Pt wet changwed the bed morw arm blankets

## 2019-12-05 NOTE — ED Notes (Signed)
Admitting paged to RN per her request 

## 2019-12-05 NOTE — ED Notes (Signed)
Admitting doctors at  The bedside

## 2019-12-05 NOTE — ED Provider Notes (Signed)
[facility] EMERGENCY DEPARTMENT Provider Note   CSN: [id number] Arrival date & time: 12/05/19  1149     History Chief Complaint  Patient presents with  . Abnormal Lab    [name] is a 86 y.o. male resenting for evaluation of elevated potassium.  Patient states he had a routine hospital follow-up with his primary care yesterday.  Blood work was drawn, patient was found to have elevated potassium.  His recommended he come to the ER.  He denies symptoms at this time.  He denies chest pain, shortness of breath, or body aches.  He denies fevers, chills, cough, nausea, vomiting, normal bowel movements.  Patient states he is urinating frequently, but is also on fluid pills.   Additional history obtained per chart review.  Patient with a history of diabetes, hyperlipidemia, CAD s/p pacer, CHF, [name], urinary retention, BPH. He has feeding tube.    HPI     Past Medical History:  Diagnosis Date  . Adenomatous colon polyp 02/14/2012  . AICD (automatic cardioverter/defibrillator) present    Medtronic   . BBB (bundle branch block)    right  . Carotid stenosis    a. s/p L carotid stent 2004;  b. Carotid US (09/2014): Bilateral ICA 1-39%, left ECA >59%, normal subclavian bilaterally, occluded left vertebral >> FU 2 years  . Chronic kidney disease   . Chronic systolic CHF (congestive heart failure) (HCC)    a. ischemic CM EF 15-20%;  b. s/p AICD 05/24/04;  c. Echo 7/06: EF 30-40%, mild reduced RVSF, d. Echo 12/2015 EF 35-40%  . Elevated PSA   . History of kidney stones    x 1  . HTN (hypertension)   . Hyperlipidemia   . ICD (implantable cardiac defibrillator) in place 12-25-2012   MDT CRTD upgrade by Dr Lovena Le  . Myocardial infarction ([place]) 1990  . Pericardial effusion    Echocardiogram (09/2014): EF 25% with distal anterior, distal inferior, distal lateral and apical akinesis, grade 1 diastolic dysfunction, very mild aortic stenosis (mean 7 mmHg) - this may be  depressed due to low EF (2-D images suggest mild to moderate aortic stenosis), large pericardial effusion, no RA collapse  . Pneumonia   . Presence of permanent cardiac pacemaker    Medtronic  . PVD (peripheral vascular disease) ([place])    s/p L carotid PTCA/stent 2004  . Transient ischemic attack   . Type II diabetes mellitus ([place])    type II    Patient Active Problem List   Diagnosis Date Noted  . Aspiration of food   . Protein-calorie malnutrition, severe 11/22/2019  . Dysphagia 11/21/2019  . Enlarged prostate   . 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 Pharyngeal dysphagia 07/10/2019  . Dysphonia 07/10/2019  . Hypertensive retinopathy of both eyes 02/05/2018  . Mild nonproliferative diabetic retinopathy of both eyes without macular edema associated with type 2 diabetes mellitus ([place]) 02/05/2018  . Dry eye syndrome of both eyes 06/06/2017  . Weight loss 12/11/2016  . Advanced care planning/counseling discussion 12/11/2016  . Urinary retention 01/13/2016  . Healthcare maintenance 11/22/2015  . [name] (chronic kidney disease) stage 3, GFR 30-59 ml/min (HCC) 12/04/2014  . Automatic implantable cardioverter-defibrillator in situ 01/16/2012  . Constipation 03/10/2009  . Coronary atherosclerosis 12/25/2007  . Chronic systolic congestive heart failure ([place]) 12/25/2007  . Hyperlipidemia 01/02/2007  . Type 2 diabetes mellitus with peripheral vascular disease ([place]) 01/01/1989    Past Surgical History:  Procedure Laterality Date  .  BI-VENTRICULAR IMPLANTABLE CARDIOVERTER DEFIBRILLATOR UPGRADE N/A 12/25/2012   Procedure: BI-VENTRICULAR IMPLANTABLE CARDIOVERTER DEFIBRILLATOR UPGRADE;  Surgeon: Evans Lance, MD;  Location: [facility] CATH LAB;  Service: Cardiovascular;  Laterality: N/A;  . BIV ICD UPGRADE  12/25/2012   MDT CRTD upgrade by Dr Lovena Le for ischemic cardiomyopathy and worsening conduction system disease  . CARDIAC CATHETERIZATION  06/2003,  01/2004  . CARDIAC CATHETERIZATION N/A  01/13/2016   Procedure: Left Heart Cath and Coronary Angiography;  Surgeon: Jettie Booze, MD;  Location: [facility] CV LAB;  Service: Cardiovascular;  Laterality: N/A;  . CARDIAC DEFIBRILLATOR PLACEMENT  05/24/2004   Implantation of a MDT single-chamber defibrillator  . CAROTID STENT  09/11/2003   Percutaneous transluminal angioplasty and stent placement of the left internal carotid artery.  Marland Kitchen CATARACT EXTRACTION W/ INTRAOCULAR LENS  IMPLANT, BILATERAL Bilateral ~ 2010  . CORONARY ANGIOPLASTY WITH STENT PLACEMENT  1990   "2" (12/25/2012)  . CYSTOSCOPY    . DIRECT LARYNGOSCOPY N/A 08/15/2019   Procedure: MICRODIRECT LARYNGOSCOPY WITH BIOPSY;  Surgeon: Melida Quitter, MD;  Location: [facility];  Service: ENT;  Laterality: N/A;  . ESOPHAGOGASTRODUODENOSCOPY (EGD) WITH PROPOFOL N/A 11/22/2019   Procedure: ESOPHAGOGASTRODUODENOSCOPY (EGD) WITH PROPOFOL with possible dilation;  Surgeon: Clarene Essex, MD;  Location: [facility];  Service: Endoscopy;  Laterality: N/A;  . INSERT / REPLACE / REMOVE PACEMAKER    . IR GASTROSTOMY TUBE MOD SED  11/27/2019  . LEAD REVISION N/A 12/25/2012   Procedure: LEAD REVISION;  Surgeon: Evans Lance, MD;  Location: [name] CATH LAB;  Service: Cardiovascular;  Laterality: N/A;  . RIGID ESOPHAGOSCOPY N/A 08/15/2019   Procedure: RIGID ESOPHAGOSCOPY;  Surgeon: Melida Quitter, MD;  Location: [facility] OR;  Service: ENT;  Laterality: N/A;       Family History  Problem Relation Age of Onset  . Diabetes Mother   . Healthy Father   . Diabetes Brother   . Heart attack Neg Hx   . Stroke Neg Hx     Social History   Tobacco Use  . Smoking status: Former Smoker    Types: Cigarettes    Quit date: 12/27/1967    Years since quitting: 51.9  . Smokeless tobacco: Never Used  Substance Use Topics  . Alcohol use: No    Alcohol/week: 0.0 standard drinks    Comment: 12/25/2012 "quit all alcohol 60 yr ago"  . Drug use: No    Home Medications Prior to Admission medications   Medication Sig  Start Date End Date Taking? Authorizing Provider  Amino Acids-Protein Hydrolys (FEEDING SUPPLEMENT, PRO-STAT SUGAR FREE 64,) LIQD Place 30 mLs into feeding tube daily. Patient taking differently: Take 30 mLs by mouth at bedtime.  11/30/19  Yes Seawell, Jaimie A, DO  atorvastatin (LIPITOR) 40 MG tablet TAKE 1 TABLET(40 MG) BY MOUTH DAILY AT 6 PM Patient taking differently: Take 40 mg by mouth daily.  03/03/19  Yes Axel Filler, MD  carvedilol (COREG) 6.25 MG tablet PLACE 1 TABLET INTO FEEDING TUBE TWICE DAILY WITH A MEAL Patient taking differently: Take 6.25 mg by mouth 2 (two) times daily with a meal.  12/02/19  Yes Axel Filler, MD  Nutritional Supplements (FEEDING SUPPLEMENT, OSMOLITE 1.5 CAL,) LIQD Place 150-237 mLs into feeding tube 5 (five) times daily. Patient taking differently: Place 237 mLs into feeding tube 5 (five) times daily.  11/30/19  Yes Seawell, Jaimie A, DO  Blood Glucose Monitoring Suppl (ACCU-CHEK AVIVA PLUS) w/Device KIT Check finger stick glucose once daily 05/06/18   Evette Doffing,  Mallie Mussel, MD  fluorometholone (FML) 0.1 % ophthalmic suspension Place 2 drops into both eyes 2 (two) times daily.    [provider]  furosemide (LASIX) 40 MG tablet Place 1 tablet (40 mg total) into feeding tube 2 (two) times daily. 12/04/19   Jeanmarie Hubert, MD  glucose blood (ACCU-CHEK AVIVA PLUS) test strip Check blood sugar up to 3 times a day 09/15/19   Axel Filler, MD  Hydrocortisone, Perianal, (PROCTO-PAK) 1 % CREA Apply 1 application topically 2 (two) times daily. Patient not taking: Reported on 11/20/2019 07/25/19   Raylene Everts, MD  insulin glargine (LANTUS) 100 UNIT/ML injection Inject 0.08 mLs (8 Units total) into the skin daily. 12/04/19   Jeanmarie Hubert, MD  Insulin Pen Needle (B-D ULTRAFINE III SHORT PEN) 31G X 8 MM MISC USE ONCE DAILY WITH LANTUS PEN 05/06/18   Axel Filler, MD  lidocaine (XYLOCAINE) 2 % solution Patient: Mix 1part 2%  viscous lidocaine, 1part H20. Swallow 75m of diluted mixture, 347m before meals and at bedtime, up to QID 10/27/19   SqEppie GibsonMD  ofloxacin (OCUFLOX) 0.3 % ophthalmic solution Place 1 drop into both eyes 4 (four) times daily.  11/06/19   [provider]  pantoprazole (PROTONIX) 40 MG tablet Take 1 tablet (40 mg total) by mouth daily. 11/30/19   Seawell, Jaimie A, DO  polyethylene glycol (MIRALAX / GLYCOLAX) 17 g packet Take 17 g by mouth daily. 07/31/19   MaGeorgette ShellMD  senna-docusate (SENOKOT-S) 8.6-50 MG tablet Place 2 tablets into feeding tube daily. Patient taking differently: Take 2 tablets by mouth daily.  11/30/19   Seawell, Jaimie A, DO  tamsulosin (FLOMAX) 0.4 MG CAPS capsule Take 0.8 mg by mouth at bedtime.     [provider]  VENTOLIN HFA 108 (90 Base) MCG/ACT inhaler INHALE 2 PUFFS INTO THE LUNGS EVERY 6 HOURS AS NEEDED FOR WHEEZING OR SHORTNESS OF BREATH Patient taking differently: Inhale 2 puffs into the lungs every 6 (six) hours as needed for wheezing or shortness of breath.  03/15/16   ViAxel FillerMD    Allergies    Patient has no known allergies.  Review of Systems   Review of Systems  All other systems reviewed and are negative.   Physical Exam Updated Vital Signs BP 132/60 (BP Location: Left Arm)   Pulse 73   Temp 98.3 F (36.8 C) (Oral)   Resp 18   Ht _0  (1.727 m)   Wt 64.7 kg   SpO2 99%   BMI 21.68 kg/m   Physical Exam Vitals and nursing note reviewed.  Constitutional:      General: He is not in acute distress.    Appearance: He is cachectic.     Comments: Elderly, chronically ill-appearing male who appears nontoxic today  HENT:     Head: Normocephalic and atraumatic.  Eyes:     Extraocular Movements: Extraocular movements intact.     Conjunctiva/sclera: Conjunctivae normal.     Pupils: Pupils are equal, round, and reactive to light.  Cardiovascular:     Rate and Rhythm: Normal rate and regular rhythm.      Pulses: Normal pulses.  Pulmonary:     Effort: Pulmonary effort is normal. No respiratory distress.     Breath sounds: Normal breath sounds. No wheezing.  Abdominal:     General: There is no distension.     Palpations: Abdomen is soft. There is no mass.     Tenderness: There  is no abdominal tenderness. There is no guarding or rebound.     Comments: Feeding tube present.  No tenderness palpation, erythema, or signs of infection.  Musculoskeletal:        General: Normal range of motion.     Cervical back: Normal range of motion and neck supple.     Right lower leg: Edema present.     Left lower leg: Edema present.     Comments: 2+ pitting edema bilaterally.  Mild weeping of anterior shins bilaterally without erythema, warmth, or signs of infection  Skin:    General: Skin is warm and dry.     Capillary Refill: Capillary refill takes less than 2 seconds.  Neurological:     Mental Status: He is alert and oriented to person, place, and time.     ED Results / Procedures / Treatments   Labs (all labs ordered are listed, but only abnormal results are displayed) Labs Reviewed  BASIC METABOLIC PANEL - Abnormal; Notable for the following components:      Result Value   Potassium 6.3 (*)    Glucose, Bld 315 (*)    BUN 33 (*)    Creatinine, Ser 1.62 (*)    GFR calc non Af Amer 38 (*)    GFR calc Af Amer 44 (*)    All other components within normal limits  CBC - Abnormal; Notable for the following components:   RBC 3.63 (*)    Hemoglobin 10.7 (*)    HCT 35.7 (*)    Platelets 149 (*)    All other components within normal limits  SARS CORONAVIRUS 2 (TAT 6-24 HRS)  URINALYSIS, ROUTINE W REFLEX MICROSCOPIC    EKG EKG Interpretation  Date/Time:  Friday December 05 2019 11:57:42 EST Ventricular Rate:  68 PR Interval:  154 QRS Duration: 154 QT Interval:  476 QTC Calculation: 506 R Axis:   -76 Text Interpretation: Atrial-sensed ventricular-paced rhythm Biventricular pacemaker detected  Abnormal ECG Confirmed by Malvin Johns [phone]) on 12/05/2019 2:13:42 PM   Radiology No results found.  Procedures .Critical Care Performed by: Franchot Heidelberg, PA-C Authorized by: Franchot Heidelberg, PA-C   Critical care provider statement:    Critical care time (minutes):  45   Critical care time was exclusive of:  Separately billable procedures and treating other patients and teaching time   Critical care was necessary to treat or prevent imminent or life-threatening deterioration of the following conditions:  Metabolic crisis   Critical care was time spent personally by me on the following activities:  Blood draw for specimens, development of treatment plan with patient or surrogate, discussions with consultants, examination of patient, obtaining history from patient or surrogate, ordering and performing treatments and interventions, ordering and review of laboratory studies, ordering and review of radiographic studies, pulse oximetry, review of old charts and re-evaluation of patient's condition   I assumed direction of critical care for this patient from another provider in my specialty: no   Comments:     Critically elevated potassium at 6.3.  Unable to assess for EKG changes due to paced rhythm.  Will give Lokelma and Lasix admit to hospital.   (including critical care time)  Medications Ordered in ED Medications  sodium chloride flush (NS) 0.9 % injection 3 mL (has no administration in time range)  sodium zirconium cyclosilicate (LOKELMA) packet 10 g (has no administration in time range)  furosemide (LASIX) injection 40 mg (has no administration in time range)  insulin aspart (novoLOG) injection 10 Units (  has no administration in time range)    ED Course  I have reviewed the triage vital signs and the nursing notes.  Pertinent labs & imaging results that were available during my care of the patient were reviewed by me and considered in my medical decision making (see chart  for details).    MDM Rules/Calculators/A&P                      Patient presenting for evaluation of elevated potassium.  He is asymptomatic at this time.  Unable to assess for EKG changes due to paced rhythm.  Labs obtained from triage interpreted by me, confirms elevated potassium at 6.3.  Otherwise close to patient's baseline.  Will start Lasix and Lokelma and to call for admission.  Discussed with internal medicine teaching service, they will evaluate the patient.    Final Clinical Impression(s) / ED Diagnoses Final diagnoses:  Hyperkalemia    Rx / DC Orders ED Discharge Orders    None       Franchot Heidelberg, PA-C 12/05/19 1515    Malvin Johns, MD 12/05/19 [phone]

## 2019-12-05 NOTE — ED Triage Notes (Signed)
Reported was sent to Ed d/t abnormal labs; patient denies pain when asked.

## 2019-12-05 NOTE — Telephone Encounter (Signed)
Call from pt's sister -stated the doctor had called to bring pt to the ER for high K+ level and they currently in the ER .

## 2019-12-06 DIAGNOSIS — Z931 Gastrostomy status: Secondary | ICD-10-CM

## 2019-12-06 DIAGNOSIS — C320 Malignant neoplasm of glottis: Secondary | ICD-10-CM

## 2019-12-06 DIAGNOSIS — R64 Cachexia: Secondary | ICD-10-CM

## 2019-12-06 DIAGNOSIS — Z923 Personal history of irradiation: Secondary | ICD-10-CM

## 2019-12-06 DIAGNOSIS — E875 Hyperkalemia: Principal | ICD-10-CM

## 2019-12-06 DIAGNOSIS — E1122 Type 2 diabetes mellitus with diabetic chronic kidney disease: Secondary | ICD-10-CM

## 2019-12-06 DIAGNOSIS — I130 Hypertensive heart and chronic kidney disease with heart failure and stage 1 through stage 4 chronic kidney disease, or unspecified chronic kidney disease: Secondary | ICD-10-CM

## 2019-12-06 DIAGNOSIS — I5022 Chronic systolic (congestive) heart failure: Secondary | ICD-10-CM

## 2019-12-06 DIAGNOSIS — N183 Chronic kidney disease, stage 3 unspecified: Secondary | ICD-10-CM

## 2019-12-06 DIAGNOSIS — E1140 Type 2 diabetes mellitus with diabetic neuropathy, unspecified: Secondary | ICD-10-CM

## 2019-12-06 LAB — BASIC METABOLIC PANEL
Anion gap: 6 (ref 5–15)
Anion gap: 8 (ref 5–15)
BUN: 37 mg/dL — ABNORMAL HIGH (ref 8–23)
BUN: 39 mg/dL — ABNORMAL HIGH (ref 8–23)
CO2: 29 mmol/L (ref 22–32)
CO2: 28 mmol/L (ref 22–32)
Calcium: 9 mg/dL (ref 8.9–10.3)
Calcium: 8.8 mg/dL — ABNORMAL LOW (ref 8.9–10.3)
Chloride: 102 mmol/L (ref 98–111)
Chloride: 99 mmol/L (ref 98–111)
Creatinine, Ser: 1.73 mg/dL — ABNORMAL HIGH (ref 0.61–1.24)
Creatinine, Ser: 1.71 mg/dL — ABNORMAL HIGH (ref 0.61–1.24)
GFR calc Af Amer: 41 mL/min — ABNORMAL LOW (ref >60)
GFR calc Af Amer: 41 mL/min — ABNORMAL LOW (ref >60)
GFR calc non Af Amer: 35 mL/min — ABNORMAL LOW (ref >60)
GFR calc non Af Amer: 35 mL/min — ABNORMAL LOW (ref >60)
Glucose, Bld: 281 mg/dL — ABNORMAL HIGH (ref 70–99)
Glucose, Bld: 163 mg/dL — ABNORMAL HIGH (ref 70–99)
Potassium: 5.8 mmol/L — ABNORMAL HIGH (ref 3.5–5.1)
Potassium: 5.8 mmol/L — ABNORMAL HIGH (ref 3.5–5.1)
Sodium: 137 mmol/L (ref 135–145)
Sodium: 135 mmol/L (ref 135–145)

## 2019-12-06 LAB — GLUCOSE, CAPILLARY
Glucose-Capillary: 231 mg/dL — ABNORMAL HIGH (ref 70–99)
Glucose-Capillary: 202 mg/dL — ABNORMAL HIGH (ref 70–99)
Glucose-Capillary: 169 mg/dL — ABNORMAL HIGH (ref 70–99)
Glucose-Capillary: 116 mg/dL — ABNORMAL HIGH (ref 70–99)
Glucose-Capillary: 91 mg/dL (ref 70–99)

## 2019-12-06 LAB — SARS CORONAVIRUS 2 (TAT 6-24 HRS): SARS Coronavirus 2: NEGATIVE

## 2019-12-06 MED ORDER — SODIUM ZIRCONIUM CYCLOSILICATE 10 G PO PACK
10.0000 g | PACK | Freq: Once | ORAL | Status: AC
  Administered 2019-12-06: 10 g via ORAL
  Filled 2019-12-06: qty 1

## 2019-12-06 MED ORDER — GABAPENTIN 300 MG PO CAPS
300.0000 mg | ORAL_CAPSULE | Freq: Two times a day (BID) | ORAL | Status: DC
  Administered 2019-12-06 – 2019-12-08 (×5): 300 mg via ORAL
  Filled 2019-12-06 (×5): qty 1

## 2019-12-06 MED ORDER — DICLOFENAC SODIUM 1 % EX GEL
4.0000 g | Freq: Four times a day (QID) | CUTANEOUS | Status: DC
  Administered 2019-12-06 – 2019-12-08 (×10): 4 g via TOPICAL
  Filled 2019-12-06: qty 100

## 2019-12-06 MED ORDER — INSULIN ASPART 100 UNIT/ML ~~LOC~~ SOLN
0.0000 [IU] | Freq: Three times a day (TID) | SUBCUTANEOUS | Status: DC
  Administered 2019-12-06: 3 [IU] via SUBCUTANEOUS

## 2019-12-06 MED ORDER — INSULIN ASPART 100 UNIT/ML ~~LOC~~ SOLN: 0.0000 [IU] | Freq: Every day | SUBCUTANEOUS | Status: DC

## 2019-12-06 NOTE — Evaluation (Signed)
Physical Therapy Evaluation Patient Details Name: [name] MRN: [id number] DOB: [birth year] Today's Date: 12/06/2019   History of Present Illness  Patient is an 86 year old male admitted from MD office due to hyperkalemia. PMH includes: DM, CHF, [name], HTN  Clinical Impression  Patient received in recliner, agrees to PT assessment. Patient performs sit to stand with supervision, he ambulated in room 40 feet with RW, supervision. Good ability and balance. He will continue to benefit from skilled PT while here to improve activity tolerance, strength and functional independence.       Follow Up Recommendations Home health PT    Equipment Recommendations  None recommended by PT    Recommendations for Other Services       Precautions / Restrictions Precautions Precaution Comments: mod fall Restrictions Weight Bearing Restrictions: No      Mobility  Bed Mobility               General bed mobility comments: patient received in recliner and returned to recliner  Transfers Overall transfer level: Needs assistance Equipment used: Rolling walker (2 wheeled) Transfers: Sit to/from Stand Sit to Stand: Supervision            Ambulation/Gait Ambulation/Gait assistance: Supervision Gait Distance (Feet): 40 Feet Assistive device: Rolling walker (2 wheeled) Gait Pattern/deviations: Step-through pattern;Decreased stride length Gait velocity: WFL      Stairs            Wheelchair Mobility    Modified Rankin (Stroke Patients Only)       Balance Overall balance assessment: Modified Independent Sitting-balance support: Feet supported Sitting balance-Leahy Scale: Good     Standing balance support: Bilateral upper extremity supported;During functional activity Standing balance-Leahy Scale: Good                               Pertinent Vitals/Pain Pain Assessment: No/denies pain    Home Living Family/patient expects to be discharged to::  Private residence Living Arrangements: Alone Available Help at Discharge: Available PRN/intermittently;Family Type of Home: Apartment Home Access: Ramped entrance     Home Layout: One level Home Equipment: [place] - 4 wheels;Cane - quad;Bedside commode;Shower seat      Prior Function Level of Independence: Independent with assistive device(s)         Comments: Uses quad cane, also has RW if needed     Hand Dominance   Dominant Hand: Right    Extremity/Trunk Assessment   Upper Extremity Assessment Upper Extremity Assessment: Generalized weakness    Lower Extremity Assessment Lower Extremity Assessment: Generalized weakness    Cervical / Trunk Assessment Cervical / Trunk Assessment: Normal  Communication   Communication: HOH;Expressive difficulties  Cognition Arousal/Alertness: Awake/alert Behavior During Therapy: WFL for tasks assessed/performed Overall Cognitive Status: Within Functional Limits for tasks assessed                                        General Comments General comments (skin integrity, edema, etc.): good ability when using RW    Exercises     Assessment/Plan    PT Assessment Patient needs continued PT services  PT Problem List Decreased strength;Decreased mobility;Decreased activity tolerance;Decreased balance;Decreased safety awareness       PT Treatment Interventions DME instruction;Therapeutic exercise;Gait training;Balance training;Functional mobility training;Therapeutic activities;Patient/family education    PT Goals (Current goals can be found in  the Care Plan section)  Acute Rehab PT Goals Patient Stated Goal: to return home PT Goal Formulation: With patient Time For Goal Achievement: 12/13/19 Potential to Achieve Goals: Good    Frequency Min 3X/week   Barriers to discharge Decreased caregiver support      Co-evaluation               AM-PAC PT "6 Clicks" Mobility  Outcome Measure Help needed  turning from your back to your side while in a flat bed without using bedrails?: None Help needed moving from lying on your back to sitting on the side of a flat bed without using bedrails?: None Help needed moving to and from a bed to a chair (including a wheelchair)?: A Little Help needed standing up from a chair using your arms (e.g., wheelchair or bedside chair)?: None Help needed to walk in hospital room?: A Little Help needed climbing 3-5 steps with a railing? : A Little 6 Click Score: 21    End of Session   Activity Tolerance: Patient tolerated treatment well Patient left: in chair;with call bell/phone within reach Nurse Communication: Mobility status PT Visit Diagnosis: Muscle weakness (generalized) (M62.81);Difficulty in walking, not elsewhere classified (R26.2)    Time: 5427-0623 PT Time Calculation (min) (ACUTE ONLY): 20 min   Charges:   PT Evaluation $PT Eval Moderate Complexity: 1 Mod PT Treatments $Gait Training: 8-22 mins        Sherrian Nunnelley, PT, GCS 12/06/19,3:31 PM

## 2019-12-06 NOTE — Care Management Obs Status (Signed)
[facility] NOTIFICATION   Patient Details  Name: [name] MRN: [id number] Date of Birth: 1930-11-20   Medicare Observation Status Notification Given:  Yes    Claudie Leach, RN 12/06/2019, 6:04 PM

## 2019-12-06 NOTE — Progress Notes (Addendum)
   Subjective:   He is feeling well this morning. He had no symptoms when he came in except the chronic burning pain in his ankles. He does state he feels weak at home and could use extra help. His son check in on him after work and is able to help at other times.   Objective:  Vital signs in last 24 hours: Vitals:   12/05/19 2030 12/05/19 2127 12/05/19 2333 12/06/19 0331  BP: 136/67 132/70  125/65  Pulse: 69 77  72  Resp: 13     Temp:   99.3 F (37.4 C) 98.4 F (36.9 C)  TempSrc:   Oral Oral  SpO2: 97% 99%  100%  Weight:      Height:        Constitution: NAD, cachectic Cardio: RRR, no m/r/g, trace LE edema  Respiratory: CTA, no w/r/r Abdominal: NTTP, soft, non-distended MSK: moving all extremities, TTP LEs, no swelling or warmth of ankles  Neuro: normal affect, a&ox3 Skin: c/d/i   Assessment/Plan:  Principal Problem:   Hyperkalemia Active Problems:   Type 2 diabetes mellitus with peripheral vascular disease (HCC)   Hyperlipidemia   Chronic systolic congestive heart failure (HCC)   Automatic implantable cardioverter-defibrillator in situ   [name] (chronic kidney disease) stage 3, GFR 30-59 ml/min (HCC)   Advanced care planning/counseling discussion   Enlarged prostate   Malignant neoplasm of glottis (HCC)   Protein-calorie malnutrition, severe  86yo male with PMH TIIDM, [name] III, urinary retention, malgnant neoplasm s/p radiation with recent PEG tube placement for immobile hemilarynx, HFrEF (EF 25% 1/16) w/defibrillation who presented yesterday with hyperkalemia.  Hyperkalemia Improved s/p lokelma. This is likely 2/2 to decreased lasix dose at last discharge in addition to his [name].   - afternoon BMP  - lokelma 10 mg now  - may need lokelma intermittently after discharge - cont. lasix 40 mg bid   Neuropathy  - gabapentin 300 mg bid  - voltaren gel  Deconditioning PEG tube  Cachectic and deconditioned 2/2 recent cancer and radiation treatment. States he is  having difficulty with some things at home and could use extra help. His son check in on him and helps him when he can.  - PT ordered     VTE: SCDs IVF: none Diet: CM Code: full   Dispo: Anticipated discharge today or tomorrow.   Molli Hazard A, DO 12/06/2019, 7:27 AM Pager: [phone]

## 2019-12-06 NOTE — TOC Transition Note (Signed)
Transition of Care [facility]) - CM/SW Discharge Note   Patient Details  Name: SHAFTER JUPIN MRN: [id number] Date of Birth: 1931/04/11  [facility] CM/SW Contact:  Claudie Leach, RN 12/06/2019, 1:54 PM   Clinical Narrative:    Contacted by MD with patient concern that PT only came to visit once this week.  [place] at [facility] contacted and states that patient had RN visits 3/7 and 3/8, PT eval 3/10 and scheduled for RN/PT twice per week.  SLP eval is Monday.  Will resume care as scheduled.    Final next level of care: [place] Barriers to Discharge: No Barriers Identified   Patient Goals and CMS Choice Patient states their goals for this hospitalization and ongoing recovery are:: to go home CMS Medicare.gov Compare Post Acute Care list provided to:: Patient Choice offered to / list presented to : Patient  Discharge Placement                       Discharge Plan and Services                            [facility] Agency: [facility] [place] Date [facility] Agency Contacted: 12/06/19 Time [place]: [phone] Representative spoke with at [place]: [place] ([place]) Interventions     Readmission Risk Interventions No flowsheet data found.

## 2019-12-07 DIAGNOSIS — Z79899 Other long term (current) drug therapy: Secondary | ICD-10-CM | POA: Diagnosis not present

## 2019-12-07 DIAGNOSIS — E785 Hyperlipidemia, unspecified: Secondary | ICD-10-CM | POA: Diagnosis present

## 2019-12-07 DIAGNOSIS — E1151 Type 2 diabetes mellitus with diabetic peripheral angiopathy without gangrene: Secondary | ICD-10-CM | POA: Diagnosis present

## 2019-12-07 DIAGNOSIS — I2510 Atherosclerotic heart disease of native coronary artery without angina pectoris: Secondary | ICD-10-CM | POA: Diagnosis present

## 2019-12-07 DIAGNOSIS — Z8673 Personal history of transient ischemic attack (TIA), and cerebral infarction without residual deficits: Secondary | ICD-10-CM | POA: Diagnosis not present

## 2019-12-07 DIAGNOSIS — Z20822 Contact with and (suspected) exposure to covid-19: Secondary | ICD-10-CM | POA: Diagnosis present

## 2019-12-07 DIAGNOSIS — I130 Hypertensive heart and chronic kidney disease with heart failure and stage 1 through stage 4 chronic kidney disease, or unspecified chronic kidney disease: Secondary | ICD-10-CM | POA: Diagnosis present

## 2019-12-07 DIAGNOSIS — Z6821 Body mass index (BMI) 21.0-21.9, adult: Secondary | ICD-10-CM | POA: Diagnosis not present

## 2019-12-07 DIAGNOSIS — E1142 Type 2 diabetes mellitus with diabetic polyneuropathy: Secondary | ICD-10-CM | POA: Diagnosis present

## 2019-12-07 DIAGNOSIS — N400 Enlarged prostate without lower urinary tract symptoms: Secondary | ICD-10-CM | POA: Diagnosis present

## 2019-12-07 DIAGNOSIS — Z95810 Presence of automatic (implantable) cardiac defibrillator: Secondary | ICD-10-CM | POA: Diagnosis not present

## 2019-12-07 DIAGNOSIS — I252 Old myocardial infarction: Secondary | ICD-10-CM | POA: Diagnosis not present

## 2019-12-07 DIAGNOSIS — N183 Chronic kidney disease, stage 3 unspecified: Secondary | ICD-10-CM | POA: Diagnosis present

## 2019-12-07 DIAGNOSIS — I5022 Chronic systolic (congestive) heart failure: Secondary | ICD-10-CM | POA: Diagnosis present

## 2019-12-07 DIAGNOSIS — Z931 Gastrostomy status: Secondary | ICD-10-CM | POA: Diagnosis not present

## 2019-12-07 DIAGNOSIS — Z833 Family history of diabetes mellitus: Secondary | ICD-10-CM | POA: Diagnosis not present

## 2019-12-07 DIAGNOSIS — Z955 Presence of coronary angioplasty implant and graft: Secondary | ICD-10-CM | POA: Diagnosis not present

## 2019-12-07 DIAGNOSIS — E43 Unspecified severe protein-calorie malnutrition: Secondary | ICD-10-CM | POA: Diagnosis present

## 2019-12-07 DIAGNOSIS — E875 Hyperkalemia: Secondary | ICD-10-CM | POA: Diagnosis present

## 2019-12-07 DIAGNOSIS — C320 Malignant neoplasm of glottis: Secondary | ICD-10-CM | POA: Diagnosis present

## 2019-12-07 DIAGNOSIS — Z923 Personal history of irradiation: Secondary | ICD-10-CM | POA: Diagnosis not present

## 2019-12-07 DIAGNOSIS — R1310 Dysphagia, unspecified: Secondary | ICD-10-CM | POA: Diagnosis present

## 2019-12-07 DIAGNOSIS — E1122 Type 2 diabetes mellitus with diabetic chronic kidney disease: Secondary | ICD-10-CM | POA: Diagnosis present

## 2019-12-07 DIAGNOSIS — I255 Ischemic cardiomyopathy: Secondary | ICD-10-CM | POA: Diagnosis present

## 2019-12-07 LAB — RENAL FUNCTION PANEL
Albumin: 2.5 g/dL — ABNORMAL LOW (ref 3.5–5.0)
Anion gap: 6 (ref 5–15)
BUN: 45 mg/dL — ABNORMAL HIGH (ref 8–23)
CO2: 31 mmol/L (ref 22–32)
Calcium: 8.9 mg/dL (ref 8.9–10.3)
Chloride: 99 mmol/L (ref 98–111)
Creatinine, Ser: 1.76 mg/dL — ABNORMAL HIGH (ref 0.61–1.24)
GFR calc Af Amer: 40 mL/min — ABNORMAL LOW (ref >60)
GFR calc non Af Amer: 34 mL/min — ABNORMAL LOW (ref >60)
Glucose, Bld: 215 mg/dL — ABNORMAL HIGH (ref 70–99)
Phosphorus: 3.7 mg/dL (ref 2.5–4.6)
Potassium: 5.8 mmol/L — ABNORMAL HIGH (ref 3.5–5.1)
Sodium: 136 mmol/L (ref 135–145)

## 2019-12-07 LAB — GLUCOSE, CAPILLARY
Glucose-Capillary: 160 mg/dL — ABNORMAL HIGH (ref 70–99)
Glucose-Capillary: 161 mg/dL — ABNORMAL HIGH (ref 70–99)
Glucose-Capillary: 155 mg/dL — ABNORMAL HIGH (ref 70–99)
Glucose-Capillary: 35 mg/dL — CL (ref 70–99)
Glucose-Capillary: 101 mg/dL — ABNORMAL HIGH (ref 70–99)

## 2019-12-07 LAB — MAGNESIUM: Magnesium: 1.7 mg/dL (ref 1.7–2.4)

## 2019-12-07 MED ORDER — NEPRO/CARBSTEADY PO LIQD
237.0000 mL | Freq: Four times a day (QID) | ORAL | Status: DC
  Administered 2019-12-07 – 2019-12-08 (×6): 237 mL
  Filled 2019-12-07: qty 237

## 2019-12-07 MED ORDER — INSULIN ASPART 100 UNIT/ML ~~LOC~~ SOLN: 0.0000 [IU] | Freq: Every day | SUBCUTANEOUS | Status: DC

## 2019-12-07 MED ORDER — PATIROMER SORBITEX CALCIUM 8.4 G PO PACK
8.4000 g | PACK | Freq: Every day | ORAL | Status: AC
  Filled 2019-12-07: qty 1

## 2019-12-07 MED ORDER — DEXTROSE 50 % IV SOLN: 25.0000 g | INTRAVENOUS | Status: AC

## 2019-12-07 MED ORDER — DEXTROSE 50 % IV SOLN
INTRAVENOUS | Status: AC
  Administered 2019-12-07: 25 mL
  Filled 2019-12-07: qty 50

## 2019-12-07 MED ORDER — INSULIN ASPART 100 UNIT/ML ~~LOC~~ SOLN
0.0000 [IU] | Freq: Three times a day (TID) | SUBCUTANEOUS | Status: DC
  Administered 2019-12-07 (×3): 4 [IU] via SUBCUTANEOUS
  Administered 2019-12-08: 3 [IU] via SUBCUTANEOUS
  Administered 2019-12-08: 4 [IU] via SUBCUTANEOUS

## 2019-12-07 NOTE — Progress Notes (Signed)
   Subjective:   Patient resting comfortably during rounds, nurse at bedside administering tube feeds. She states he just had some increased activity and had requested to rest but has overall been doing and feeling well. Let her know to page if he had any questions.   Objective:  Vital signs in last 24 hours: Vitals:   12/06/19 0331 12/06/19 1455 12/06/19 2113 12/07/19 0531  BP: 125/65 (!) 105/47 (!) 109/52 (!) 114/58  Pulse: 72 70 67 70  Resp:  18  14  Temp: 98.4 F (36.9 C) 98 F (36.7 C) 97.6 F (36.4 C) 98.3 F (36.8 C)  TempSrc: Oral Oral Oral Oral  SpO2: 100% 98% 100% 92%  Weight:      Height:       Constitution: resting comfortably, cachectic.  Cardio: RRR, no m/r/g, no LE edema  Respiratory: CTA, no w/r/r Abdominal: NTTP, soft, non-distended, PEG tube without drainage or erythema Skin: c/d/i   Assessment/Plan:  Principal Problem:   Hyperkalemia Active Problems:   Type 2 diabetes mellitus with peripheral vascular disease (HCC)   Hyperlipidemia   Chronic systolic congestive heart failure (HCC)   Automatic implantable cardioverter-defibrillator in situ   [name] (chronic kidney disease) stage 3, GFR 30-59 ml/min (HCC)   Advanced care planning/counseling discussion   Enlarged prostate   Malignant neoplasm of glottis (HCC)   Protein-calorie malnutrition, severe  86yo male with past medical history type 2 diabetes, [name] stage III, urinary retention, malignant neoplasm status post radiation with recent PEG tube placement for immobile hemilarynx, HFrEF with EF 25%, who presented with hyperkalemia.  Hyperkalemia Continues to remain elevated at 5.8 despite resuming Lasix twice daily and receiving Lokelma. He will be safe for discharge once the source of this is determined and can be managed. His Osmolite does have an increased amount of potassium which was recently started during last admission.  Will have diet changed and to follow-up BMP tomorrow as changing his diet may  take longer for the K to adjust.   - veltassa x1 -Consult dietitian to switch Osmolite to nephro tube feed -Continue Lasix 40 twice daily -Follow-up BMP tomorrow  Neuropathy -Continue gabapentin 300 mg twice daily -Voltaren gel as needed  VTE: heparin IVF: none Diet: tube feeds Code: full   Dispo: Anticipated discharge tomorrow.    Marty Heck, DO 12/07/2019, 6:38 AM Pager: [phone]

## 2019-12-07 NOTE — Progress Notes (Signed)
Initial Nutrition Assessment RD working remotely.  DOCUMENTATION CODES:   Severe malnutrition in context of chronic illness  INTERVENTION:   Can trial changing TF to Nepro to see if this helps potassium level. Nepro 1 carton QID with Pro-stat 30 ml once daily would provide 1800 kcal, 91 gm protein, 688 ml free water daily.  NUTRITION DIAGNOSIS:   Severe Malnutrition related to chronic illness, cancer and cancer related treatments as evidenced by severe fat depletion, severe muscle depletion, energy intake < or equal to 75% for > or equal to 1 month(per recent nutrition focused physcial exam).  GOAL:   Patient will meet greater than or equal to 90% of their needs  MONITOR:   TF tolerance, Skin, Labs  REASON FOR ASSESSMENT:   Consult Enteral/tube feeding initiation and management  ASSESSMENT:   86 yo male admitted with hyperkalemia. PMH includes malignant neoplasm of the glottis s/p radiation, PEG, DM2, HLD, CHF, [name], severe PCM.   Patient has been receiving bolus TF via PEG since PEG placement on 3/4. Osmolite 1.5 1 carton 5 times per day with Pro-stat 30 ml once daily to provide 1875 kcal, 90 gm protein, 905 ml free water daily.   Received MD Consult for TF initiation and management. Request to change TF formula due to ongoing hyperkalemia. K was 6.3 on admission, down to 6.2 with lokelma, down to 5.8 3/13, and remains elevated at 5.8 today.   Labs reviewed. Potassium 5.8 (H), BUN 45 (H), creat 1.76 (H) CBG's: [id number]  Medications reviewed and include lasix, novolog, flomax.  I/O -1.8 L since admission. 2+ pitting edema present per MD exam on 3/12, trace LE edema 3/13.   Patient has a history of severe malnutrition, likely ongoing.   Usual weights reviewed.  Difficult to trend weights with history of edema.   NUTRITION - FOCUSED PHYSICAL EXAM:  unable to complete  Diet Order:   Diet Order    None      EDUCATION NEEDS:   Not appropriate for education  at this time  Skin:  Skin Assessment: Reviewed RN Assessment  Last BM:  3/12  Height:   Ht Readings from Last 1 Encounters:  12/05/19 5\' 8"  (1.727 m)    Weight:   Wt Readings from Last 1 Encounters:  12/05/19 64.7 kg    Ideal Body Weight:  70 kg  BMI:  Body mass index is 21.68 kg/m.  Estimated Nutritional Needs:   Kcal:  2620-3559  Protein:  85-100 gm  Fluid:  >/= 1.8 L    Molli Barrows, RD, LDN, CNSC Please refer to Amion for contact information.

## 2019-12-07 NOTE — Evaluation (Signed)
Clinical/Bedside Swallow Evaluation Patient Details  Name: [name] MRN: [id number] Date of Birth: May 05, 1931  Today's Date: 12/07/2019 Time: SLP Start Time (ACUTE ONLY): 1455 SLP Stop Time (ACUTE ONLY): 1518 SLP Time Calculation (min) (ACUTE ONLY): 23 min  Past Medical History:  Past Medical History:  Diagnosis Date  . Adenomatous colon polyp 02/14/2012  . AICD (automatic cardioverter/defibrillator) present    Medtronic   . BBB (bundle branch block)    right  . Carotid stenosis    a. s/p L carotid stent 2004;  b. Carotid US (09/2014): Bilateral ICA 1-39%, left ECA >59%, normal subclavian bilaterally, occluded left vertebral >> FU 2 years  . Chronic kidney disease   . Chronic systolic CHF (congestive heart failure) (HCC)    a. ischemic CM EF 15-20%;  b. s/p AICD 05/24/04;  c. Echo 7/06: EF 30-40%, mild reduced RVSF, d. Echo 12/2015 EF 35-40%  . Elevated PSA   . History of kidney stones    x 1  . HTN (hypertension)   . Hyperlipidemia   . ICD (implantable cardiac defibrillator) in place 12-25-2012   MDT CRTD upgrade by Dr Lovena Le  . Myocardial infarction ([place]) 1990  . Pericardial effusion    Echocardiogram (09/2014): EF 25% with distal anterior, distal inferior, distal lateral and apical akinesis, grade 1 diastolic dysfunction, very mild aortic stenosis (mean 7 mmHg) - this may be depressed due to low EF (2-D images suggest mild to moderate aortic stenosis), large pericardial effusion, no RA collapse  . Pneumonia   . Presence of permanent cardiac pacemaker    Medtronic  . PVD (peripheral vascular disease) ([place])    s/p L carotid PTCA/stent 2004  . Transient ischemic attack   . Type II diabetes mellitus ([place])    type II   Past Surgical History:  Past Surgical History:  Procedure Laterality Date  . BI-VENTRICULAR IMPLANTABLE CARDIOVERTER DEFIBRILLATOR UPGRADE N/A 12/25/2012   Procedure: BI-VENTRICULAR IMPLANTABLE CARDIOVERTER DEFIBRILLATOR UPGRADE;  Surgeon: Evans Lance, MD;   Location: [facility] CATH LAB;  Service: Cardiovascular;  Laterality: N/A;  . BIV ICD UPGRADE  12/25/2012   MDT CRTD upgrade by Dr Lovena Le for ischemic cardiomyopathy and worsening conduction system disease  . CARDIAC CATHETERIZATION  06/2003,  01/2004  . CARDIAC CATHETERIZATION N/A 01/13/2016   Procedure: Left Heart Cath and Coronary Angiography;  Surgeon: Jettie Booze, MD;  Location: [place] CV LAB;  Service: Cardiovascular;  Laterality: N/A;  . CARDIAC DEFIBRILLATOR PLACEMENT  05/24/2004   Implantation of a MDT single-chamber defibrillator  . CAROTID STENT  09/11/2003   Percutaneous transluminal angioplasty and stent placement of the left internal carotid artery.  Marland Kitchen CATARACT EXTRACTION W/ INTRAOCULAR LENS  IMPLANT, BILATERAL Bilateral ~ 2010  . CORONARY ANGIOPLASTY WITH STENT PLACEMENT  1990   "2" (12/25/2012)  . CYSTOSCOPY    . DIRECT LARYNGOSCOPY N/A 08/15/2019   Procedure: MICRODIRECT LARYNGOSCOPY WITH BIOPSY;  Surgeon: Melida Quitter, MD;  Location: [facility];  Service: ENT;  Laterality: N/A;  . ESOPHAGOGASTRODUODENOSCOPY (EGD) WITH PROPOFOL N/A 11/22/2019   Procedure: ESOPHAGOGASTRODUODENOSCOPY (EGD) WITH PROPOFOL with possible dilation;  Surgeon: Clarene Essex, MD;  Location: [facility];  Service: Endoscopy;  Laterality: N/A;  . INSERT / REPLACE / REMOVE PACEMAKER    . IR GASTROSTOMY TUBE MOD SED  11/27/2019  . LEAD REVISION N/A 12/25/2012   Procedure: LEAD REVISION;  Surgeon: Evans Lance, MD;  Location: [facility] C[facility] LAB;  Service: Cardiovascular;  Laterality: N/A;  . RIGID ESOPHAGOSCOPY N/A 08/15/2019  Procedure: RIGID ESOPHAGOSCOPY;  Surgeon: Melida Quitter, MD;  Location: [place];  Service: ENT;  Laterality: N/A;   HPI:  Mr [name], 88y/m presented to ED after lab work from outpatient visit revealed low potassium level of 6/5. 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 Peg tube, type 2 diabetes, [name] III, urinary retention, malignant neoplasm of glottis, and HFrEF with EF=25%  (09/2014) with a cardioverter-defibrillator. Known to Speech therapy. MBS 3/1 recommending NPO with trials of Puree, honey thick liqiuds. Pt has been NPO at home with TF through Peg. On this admission, he would like to eat by mouth if possible.    Assessment / Plan / Recommendation Clinical Impression  Clinical bedside swallow evaluation completed upon request of MD. Pt expressed a desire to eat if possible. He reports no further pain in his throat. He has an effective cough and throat clear to clear pharyngeal secretions (demonstrated). Trials of pudding only given, but tolerated with no vocal changes, cough or throat clear. Pt used a chin tuck and head turn to left side without a cue from therapist. He is very motivated to eat. Recommend a MBS to assess swallow function under fluoroscopy. Pt is willing to have test.   SLP Visit Diagnosis: Dysphagia, pharyngeal phase (R13.13)    Aspiration Risk  Moderate aspiration risk;Severe aspiration risk    Diet Recommendation NPO   Medication Administration: Via alternative means Supervision: Patient able to self feed    Other  Recommendations Oral Care Recommendations: Oral care BID   Follow up Recommendations 24 hour supervision/assistance                  Prognosis Prognosis for Safe Diet Advancement: Fair      Swallow Study   General Date of Onset: 12/05/19 HPI: Mr [name], 88y/m presented to ED after lab work from outpatient visit revealed low potassium level of 6/5. 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 Peg tube, type 2 diabetes, [name] III, urinary retention, malignant neoplasm of glottis, and HFrEF with EF=25% (09/2014) with a cardioverter-defibrillator. Known to Speech therapy. MBS 3/1 recommending NPO with trials of Puree, honey thick liqiuds. Pt has been NPO at home with TF through Peg. On this admission, he would like to eat by mouth if possible.  Type of Study: Bedside Swallow Evaluation Previous Swallow  Assessment: MBS 11/24/19 Diet Prior to this Study: NPO Temperature Spikes Noted: No Respiratory Status: Room air History of Recent Intubation: No Behavior/Cognition: Alert;Cooperative;Pleasant mood Oral Cavity Assessment: Within Functional Limits Oral Care Completed by SLP: Yes Oral Cavity - Dentition: Edentulous Vision: Functional for self-feeding Self-Feeding Abilities: Able to feed self Patient Positioning: Upright in chair Baseline Vocal Quality: Wet(strong cough) Volitional Cough: Strong Volitional Swallow: Able to elicit    Oral/Motor/Sensory Function Overall Oral Motor/Sensory Function: Generalized oral weakness   Ice Chips Ice chips: Not tested   Thin Liquid Thin Liquid: Not tested    Nectar Thick Nectar Thick Liquid: Not tested   Honey Thick Honey Thick Liquid: Not tested   Puree Puree: Within functional limits   Solid     Solid: Not tested      Wynelle Bourgeois., MA, CCC-SLP 12/07/2019,3:22 PM

## 2019-12-08 ENCOUNTER — Inpatient Hospital Stay (HOSPITAL_COMMUNITY): Payer: Medicare HMO

## 2019-12-08 DIAGNOSIS — R1310 Dysphagia, unspecified: Secondary | ICD-10-CM

## 2019-12-08 DIAGNOSIS — E43 Unspecified severe protein-calorie malnutrition: Secondary | ICD-10-CM

## 2019-12-08 LAB — GLUCOSE, CAPILLARY
Glucose-Capillary: 127 mg/dL — ABNORMAL HIGH (ref 70–99)
Glucose-Capillary: 165 mg/dL — ABNORMAL HIGH (ref 70–99)
Glucose-Capillary: 111 mg/dL — ABNORMAL HIGH (ref 70–99)

## 2019-12-08 LAB — BASIC METABOLIC PANEL
Anion gap: 8 (ref 5–15)
Anion gap: 9 (ref 5–15)
BUN: 46 mg/dL — ABNORMAL HIGH (ref 8–23)
BUN: 46 mg/dL — ABNORMAL HIGH (ref 8–23)
CO2: 31 mmol/L (ref 22–32)
CO2: 29 mmol/L (ref 22–32)
Calcium: 8.8 mg/dL — ABNORMAL LOW (ref 8.9–10.3)
Calcium: 8.8 mg/dL — ABNORMAL LOW (ref 8.9–10.3)
Chloride: 97 mmol/L — ABNORMAL LOW (ref 98–111)
Chloride: 97 mmol/L — ABNORMAL LOW (ref 98–111)
Creatinine, Ser: 1.84 mg/dL — ABNORMAL HIGH (ref 0.61–1.24)
Creatinine, Ser: 1.85 mg/dL — ABNORMAL HIGH (ref 0.61–1.24)
GFR calc Af Amer: 38 mL/min — ABNORMAL LOW (ref >60)
GFR calc Af Amer: 37 mL/min — ABNORMAL LOW (ref >60)
GFR calc non Af Amer: 32 mL/min — ABNORMAL LOW (ref >60)
GFR calc non Af Amer: 32 mL/min — ABNORMAL LOW (ref >60)
Glucose, Bld: 144 mg/dL — ABNORMAL HIGH (ref 70–99)
Glucose, Bld: 174 mg/dL — ABNORMAL HIGH (ref 70–99)
Potassium: 5.4 mmol/L — ABNORMAL HIGH (ref 3.5–5.1)
Potassium: 4.9 mmol/L (ref 3.5–5.1)
Sodium: 136 mmol/L (ref 135–145)
Sodium: 135 mmol/L (ref 135–145)

## 2019-12-08 MED ORDER — ACETAMINOPHEN 325 MG PO TABS: 650.0000 mg | ORAL_TABLET | Freq: Four times a day (QID) | ORAL | Status: DC | PRN

## 2019-12-08 MED ORDER — SODIUM ZIRCONIUM CYCLOSILICATE 10 G PO PACK
10.0000 g | PACK | Freq: Once | ORAL | Status: AC
  Administered 2019-12-08: 10 g via ORAL
  Filled 2019-12-08: qty 1

## 2019-12-08 MED ORDER — NEPRO/CARBSTEADY PO LIQD: 237.0000 mL | Freq: Four times a day (QID) | ORAL | 0 refills | Status: DC

## 2019-12-08 NOTE — Progress Notes (Signed)
Physical Therapy Treatment Patient Details Name: Robert BYNUM MRN: [id number] DOB: [birth year] Today's Date: 12/08/2019    History of Present Illness Patient is an 86 year old male admitted from MD office due to hyperkalemia. PMH includes: DM, CHF, [name], HTN    PT Comments    Pt pleasant with winter hat on and reports son assists at home. Pt states he walks around the inside hallway of his apartment complex daily and that he does not normally use Rw. Pt stating he uses condom cath at home and unable to recall room number. Pt with increased gait and activity tolerance this session with encouragement to mobilize with nursing staff.     Follow Up Recommendations  Home health PT     Equipment Recommendations  None recommended by PT    Recommendations for Other Services       Precautions / Restrictions Precautions Precautions: Fall    Mobility  Bed Mobility Overal bed mobility: Modified Independent                Transfers Overall transfer level: Needs assistance   Transfers: Sit to/from Stand Sit to Stand: Supervision         General transfer comment: supervision for safety  Ambulation/Gait Ambulation/Gait assistance: Supervision Gait Distance (Feet): 350 Feet Assistive device: Rolling walker (2 wheeled) Gait Pattern/deviations: Step-through pattern;Decreased stride length;Trunk flexed   Gait velocity interpretation: >2.62 ft/sec, indicative of community ambulatory General Gait Details: cues for proximity to RW and for recall of room number   Stairs             Wheelchair Mobility    Modified Rankin (Stroke Patients Only)       Balance Overall balance assessment: Needs assistance   Sitting balance-Leahy Scale: Good       Standing balance-Leahy Scale: Fair                              Cognition Arousal/Alertness: Awake/alert Behavior During Therapy: WFL for tasks assessed/performed Overall Cognitive Status:  Impaired/Different from baseline Area of Impairment: Memory;Safety/judgement                     Memory: Decreased short-term memory   Safety/Judgement: Decreased awareness of deficits     General Comments: pt with condom cath and when asked if he can tell need to void he stated he uses one of these (condom cath) at home. Pt unable to recall room number      Exercises General Exercises - Lower Extremity Long Arc Quad: AROM;Both;Seated;20 reps Hip ABduction/ADduction: AROM;Both;Seated;20 reps Hip Flexion/Marching: AROM;Both;Seated;20 reps    General Comments        Pertinent Vitals/Pain Pain Assessment: No/denies pain    Home Living                      Prior Function            PT Goals (current goals can now be found in the care plan section) Progress towards PT goals: Progressing toward goals    Frequency    Min 3X/week      PT Plan Current plan remains appropriate    Co-evaluation              AM-PAC PT "6 Clicks" Mobility   Outcome Measure  Help needed turning from your back to your side while in a flat bed without using bedrails?: None Help needed moving  from lying on your back to sitting on the side of a flat bed without using bedrails?: None Help needed moving to and from a bed to a chair (including a wheelchair)?: None Help needed standing up from a chair using your arms (e.g., wheelchair or bedside chair)?: A Little Help needed to walk in hospital room?: A Little Help needed climbing 3-5 steps with a railing? : A Little 6 Click Score: 21    End of Session   Activity Tolerance: Patient tolerated treatment well Patient left: in chair;with call bell/phone within reach Nurse Communication: Mobility status PT Visit Diagnosis: Muscle weakness (generalized) (M62.81);Difficulty in walking, not elsewhere classified (R26.2)     Time: 5329-9242 PT Time Calculation (min) (ACUTE ONLY): 19 min  Charges:  $Gait Training: 8-22  mins                     Bayard Males, PT Acute Rehabilitation Services Pager: [phone] Office: [place] 12/08/2019, 1:12 PM

## 2019-12-08 NOTE — Progress Notes (Signed)
   NAME:  [name], MRN:  [id number], DOB:  [birth year], LOS: 1 ADMISSION DATE:  12/05/2019   Subjective  No overnight events.  No complaints this morning except for foot pain.  Objective   Blood pressure (!) 107/57, pulse 64, temperature 98 F (36.7 C), temperature source Oral, resp. rate 16, height 5\' 8"  (1.727 m), weight 64.7 kg, SpO2 97 %.     Intake/Output Summary (Last 24 hours) at 12/08/2019 0553 Last data filed at 12/07/2019 1900 Gross per 24 hour  Intake 711 ml  Output 1200 ml  Net -489 ml   Filed Weights   12/05/19 1216  Weight: 64.7 kg    Examination: GENERAL: in no acute distress CARDIAC: heart RRR. No peripheral edema.  PULMONARY: acyanotic. Lung sounds clear to auscultation. ABDOMEN: soft. Nontender to palpation.  Nondistended.  SKIN: no rash or lesions on limited exam   Labs    CBC Latest Ref Rng & Units 12/05/2019 11/28/2019 11/27/2019  WBC 4.0 - 10.5 K/uL 7.9 7.8 7.1  Hemoglobin 13.0 - 17.0 g/dL 10.7(L) 12.0(L) 11.0(L)  Hematocrit 39.0 - 52.0 % 35.7(L) 38.6(L) 35.4(L)  Platelets 150 - 400 K/uL 149(L) 199 138(L)   BMP Latest Ref Rng & Units 12/08/2019 12/07/2019 12/06/2019  Glucose 70 - 99 mg/dL 144(H) 215(H) 163(H)  BUN 8 - 23 mg/dL 46(H) 45(H) 39(H)  Creatinine 0.61 - 1.24 mg/dL 1.84(H) 1.76(H) 1.71(H)  BUN/Creat Ratio 10 - 24 - - -  Sodium 135 - 145 mmol/L 136 136 135  Potassium 3.5 - 5.1 mmol/L 5.4(H) 5.8(H) 5.8(H)  Chloride 98 - 111 mmol/L 97(L) 99 99  CO2 22 - 32 mmol/L 31 31 28   Calcium 8.9 - 10.3 mg/dL 8.8(L) 8.9 8.8(L)    Summary  [name] is an 86 yo gentleman s/p laryngeal cancer s/p radiation treatment who developed dysphagia due to laryngeal edema resulting in PEG tube placement that was placed last admission. He is admitted to the IMTS for hyperkalemia.  Assessment & Plan:  Principal Problem:   Hyperkalemia Active Problems:   Type 2 diabetes mellitus with peripheral vascular disease (HCC)   Hyperlipidemia   Chronic systolic  congestive heart failure (HCC)   Automatic implantable cardioverter-defibrillator in situ   [name] (chronic kidney disease) stage 3, GFR 30-59 ml/min (HCC)   Advanced care planning/counseling discussion   Enlarged prostate   Malignant neoplasm of glottis (HCC)   Protein-calorie malnutrition, severe  Hyperkalemia Suspected to be at least partly attributed to d/c of lasix recently in addition to significant amounts of K in his tube feeds. Appreciate nutrition assistance with tube feed recs. 5.4 this morning without receiving treatment overnight. Suspect that this will continue to improve with tube feed change. Plan: lokelma. Lasix 40mg  bid.tele monitor.  Severe protein calorie malnutrition. PEG placed last admission. Suspect that hyperkalemia was attributable to higher levels of K in tube feeds. Appreciate nutrition's assistance with this matter. Continue tube feeds.  Best practice:  CODE STATUS: FULL Diet: tube feeds DVT for prophylaxis: heparin Dispo: medically stable for discharge pending follow up K this afternoon.   Mitzi Hansen, MD INTERNAL MEDICINE RESIDENT PGY-1 PAGER #: [phone] 12/08/19  5:53 AM

## 2019-12-08 NOTE — Progress Notes (Signed)
Dr. Darrick Meigs called and confirmed medication orders. All medication to be administered via tube. MD aware of patient report of foot pain. No further orders.

## 2019-12-08 NOTE — Plan of Care (Signed)

## 2019-12-08 NOTE — Progress Notes (Signed)
Modified Barium Swallow Progress Note  Patient Details  Name: [name] MRN: [id number] Date of Birth: 05-22-1931  Today's Date: 12/08/2019  Modified Barium Swallow completed.  Full report located under Chart Review in the Imaging Section.  Brief recommendations include the following:  Clinical Impression  Pt demonstrates no significant change in function since prior MBS at the beginning on the month. There continues to be moderate to severe vallecular residue due to decreased base of tongue propulsion and epiglottic deflection. Though several positional strategies were attempted, as well as a Super-supraglottic swallow, the chin tuck to the left should continued to be the most effective strategy to transit the maximum amount of bolus and decrease residue. Despite this, regardless of accuracy with strategy, or use of thickened liquids, the pt continued to have mild aspiration or residue spilling to pyriforms post swallow. Sensation was decreased and intermittent, insufficient to fully aject aspirate. In comparison with thin liquids in isolation, still using a chin tuck left, there is no benefit of honey thick liquids and perhaps even increased risk. Recommend primary use of PEG tube for nutrition, with ongoing OP SLP interventions and discussion regarding a water protocol for oral intake for comfort.    Swallow Evaluation Recommendations       SLP Diet Recommendations: Other (Comment)(consider water protocol)   Liquid Administration via: Cup;Straw   Medication Administration: Via alternative means       Compensations: Slow rate;Small sips/bites;Clear throat intermittently   Postural Changes: Other (Comment)(chin tuck left)          [name], MA CCC-SLP  Acute Rehabilitation Services Pager [phone] Office [phone]   Lynann Beaver 12/08/2019,2:05 PM

## 2019-12-08 NOTE — Discharge Summary (Signed)
 Name: [name] MRN: [id number] DOB: 11/03/1930 86 y.o. PCP: Vincent, Duncan Thomas, MD  Date of Admission: 12/05/2019 12:14 PM Date of Discharge: 12/08/19 Attending Physician: Hoffman, Erik C, DO  Discharge Diagnosis: 1. Hyperkalemia 2. Dysphagia 3. Physical deocnitioning.  Discharge Medications: Allergies as of 12/08/2019   No Known Allergies     Medication List    TAKE these medications   Accu-Chek Aviva Plus test strip Generic drug: glucose blood Check blood sugar up to 3 times a day   Accu-Chek Aviva Plus w/Device Kit Check finger stick glucose once daily   atorvastatin 40 MG tablet Commonly known as: LIPITOR TAKE 1 TABLET(40 MG) BY MOUTH DAILY AT 6 PM What changed:   how much to take  how to take this  when to take this  additional instructions   carvedilol 6.25 MG tablet Commonly known as: COREG PLACE 1 TABLET INTO FEEDING TUBE TWICE DAILY WITH A MEAL What changed: See the new instructions.   feeding supplement (NEPRO CARB STEADY) Liqd Place 237 mLs into feeding tube 4 (four) times daily. What changed:   how much to take  when to take this   feeding supplement (PRO-STAT SUGAR FREE 64) Liqd Place 30 mLs into feeding tube daily.   fluorometholone 0.1 % ophthalmic suspension Commonly known as: FML Place 2 drops into both eyes 2 (two) times daily.   furosemide 40 MG tablet Commonly known as: LASIX Place 1 tablet (40 mg total) into feeding tube 2 (two) times daily.   Hydrocortisone (Perianal) 1 % Crea Commonly known as: Procto-Pak Apply 1 application topically 2 (two) times daily.   insulin glargine 100 UNIT/ML injection Commonly known as: LANTUS Inject 0.08 mLs (8 Units total) into the skin daily.   Insulin Pen Needle 31G X 8 MM Misc Commonly known as: B-D ULTRAFINE III SHORT PEN USE ONCE DAILY WITH LANTUS PEN   lidocaine 2 % solution Commonly known as: XYLOCAINE Patient: Mix 1part 2% viscous lidocaine, 1part H20. Swallow 10mL  of diluted mixture, 30min before meals and at bedtime, up to QID   ofloxacin 0.3 % ophthalmic solution Commonly known as: OCUFLOX Place 1 drop into both eyes 4 (four) times daily.   pantoprazole 40 MG tablet Commonly known as: PROTONIX Take 1 tablet (40 mg total) by mouth daily.   polyethylene glycol 17 g packet Commonly known as: MIRALAX / GLYCOLAX Take 17 g by mouth daily.   senna-docusate 8.6-50 MG tablet Commonly known as: Senokot-S Place 2 tablets into feeding tube daily.   tamsulosin 0.4 MG Caps capsule Commonly known as: FLOMAX Take 0.8 mg by mouth at bedtime.   Ventolin HFA 108 (90 Base) MCG/ACT inhaler Generic drug: albuterol INHALE 2 PUFFS INTO THE LUNGS EVERY 6 HOURS AS NEEDED FOR WHEEZING OR SHORTNESS OF BREATH What changed: See the new instructions.       Disposition and follow-up:   [name] was discharged from [facility] in Stable condition.  At the hospital follow up visit please address:  1.  Hyperkalemia. Suspected to be from tube feeds that were started last admission. Dietary consulted and feeds were changed. Please recheck a BMP at hospital follow up.  2. Physical deconditioning. HH PT, ST, RN ordered at discharge.  Follow-up Appointments: Follow-up Information    Vincent, Duncan Thomas, MD Follow up in 5 day(s).   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 He was subsequently discharged on tube feeds the following day. He returned to clinic on 12/04/19 at which time labs showed hyperkalemia--K 6.5. He was admitted to the IMTS for management of this. He remained hemodynamically stable and no  significant arrhthymias were detected via telemetry monitor over the course of his hospitalization. Nutrition was consulted and changed to a tube feed with less K which improved his serum K levels. He was discharged with instructions to follow up in the clinic for repeat BMP later this week.  He has also developed fairly significant physical deconditioning over the course of his disease. He was evaluated by therapy who recommended home health PT, RN and ST.  Discharge Vitals:   BP 114/75 (BP Location: Left Arm)   Pulse 68   Temp 98.1 F (36.7 C) (Oral)   Resp 17   Ht 5' 8" (1.727 m)   Wt 64.7 kg   SpO2 100%   BMI 21.68 kg/m   Pertinent Labs, Studies, and Procedures:  BMP Latest Ref Rng & Units 12/08/2019 12/08/2019 12/07/2019  Glucose 70 - 99 mg/dL 174(H) 144(H) 215(H)  BUN 8 - 23 mg/dL 46(H) 46(H) 45(H)  Creatinine 0.61 - 1.24 mg/dL 1.85(H) 1.84(H) 1.76(H)  BUN/Creat Ratio 10 - 24 - - -  Sodium 135 - 145 mmol/L 135 136 136  Potassium 3.5 - 5.1 mmol/L 4.9 5.4(H) 5.8(H)  Chloride 98 - 111 mmol/L 97(L) 97(L) 99  CO2 22 - 32 mmol/L 29 31 31  Calcium 8.9 - 10.3 mg/dL 8.8(L) 8.8(L) 8.9     Signed:  , MD 12/08/2019, 1:38 PM   Pager: [phone]  

## 2019-12-08 NOTE — TOC Initial Note (Addendum)
Transition of Care [facility]) - Initial/Assessment Note    Patient Details  Name: [name] MRN: [id number] Date of Birth: 21-Mar-1931  [facility]) CM/SW Contact:    Marilu Favre, RN Phone Number: 12/08/2019, 11:37 AM  Clinical Narrative:                 8546 Tube feedings have been changed to Nepro , [facility] checking benefits and if they can get supplies to patient this evening . Nurse will send 4 cans of nepro home with patient tonight. His shipment will arrive tomorrow afternoon,.   Confirmed face sheet information with patient. Patient from home by himself. He has a son and sister who assist him. He is able to get prescriptions filled and has transportation.    He was doing his own tube feeds and G tube care . He was active with [facility] for HHRN,PT,SP and wants to continue. Messaged MD for orders. If potassium level is ok discharge is today. [place] aware. Confirmed patient has tube feeding supplies at home.   Pam with [facility] aware patient is back in hospital, as long as tube feeding orders do not change she does not need new orders. Expected Discharge Plan: [place] Barriers to Discharge: Continued Medical Work up   Patient Goals and CMS Choice Patient states their goals for this hospitalization and ongoing recovery are:: to return to home CMS Medicare.gov Compare Post Acute Care list provided to:: Patient Choice offered to / list presented to : Patient  Expected Discharge Plan and Services Expected Discharge Plan: [place]   Discharge Planning Services: CM Consult Post Acute Care Choice: [place] arrangements for the past 2 months: Single Family Home                 DME Arranged: N/A         HH Arranged: NA HH Agency: [facility] Date [place] Agency Contacted: 12/06/19 Time [place]: [phone] Representative spoke with at [place]: [place]  Arrangements/Services Living arrangements for the past 2 months: [place] with:: Self   Do you feel safe going back to the place where you live?: Yes      Need for Family Participation in Patient Care: Yes (Comment) Care giver support system in place?: Yes (comment) Current home services: DME Criminal Activity/Legal Involvement Pertinent to Current Situation/Hospitalization: No - Comment as needed  Activities of Daily Living      Permission Sought/Granted   Permission granted to share information with : No              Emotional Assessment Appearance:: Appears stated age Attitude/Demeanor/Rapport: Engaged Affect (typically observed): Accepting Orientation: : Oriented to Self, Oriented to Place, Oriented to  Time, Oriented to Situation Alcohol / Substance Use: Not Applicable Psych Involvement: No (comment)  Admission diagnosis:  Hyperkalemia [E87.5] Patient Active Problem List   Diagnosis Date Noted  . Hyperkalemia 12/05/2019  . Aspiration of food   . Protein-calorie malnutrition, severe 11/22/2019  . Dysphagia 11/21/2019  . Enlarged prostate   . Malignant neoplasm of glottis ([place]) 07/10/2019  . Pharyngeal dysphagia 07/10/2019  . Dysphonia 07/10/2019  . Hypertensive retinopathy of both eyes 02/05/2018  . Mild nonproliferative diabetic retinopathy of both eyes without macular edema associated with type 2 diabetes mellitus ([place]) 02/05/2018  . Dry eye syndrome of both eyes 06/06/2017  . Weight loss 12/11/2016  . Advanced  care planning/counseling discussion 12/11/2016  . Urinary retention 01/13/2016  . Healthcare maintenance 11/22/2015  . [name] (chronic kidney disease) stage 3, GFR 30-59 ml/min (HCC) 12/04/2014  . Automatic implantable cardioverter-defibrillator in situ 01/16/2012  . Constipation 03/10/2009  . Coronary atherosclerosis 12/25/2007  . Chronic systolic congestive heart failure ([place]) 12/25/2007  . Hyperlipidemia 01/02/2007  . Type 2 diabetes  mellitus with peripheral vascular disease ([place]) 01/01/1989   PCP:  Axel Filler, MD Pharmacy:   [facility] [place], [place] - [address] AT [place] [address] Phone: [phone] Fax: [phone]     Social Determinants of Health (SDOH) Interventions    Readmission Risk Interventions No flowsheet data found.

## 2019-12-08 NOTE — Progress Notes (Signed)
Pt off unit, left for procedure

## 2019-12-08 NOTE — Progress Notes (Signed)
Telemetry discontinued. Patient being discharged home.

## 2019-12-09 ENCOUNTER — Telehealth: Payer: Self-pay | Admitting: *Deleted

## 2019-12-09 DIAGNOSIS — R1310 Dysphagia, unspecified: Secondary | ICD-10-CM | POA: Diagnosis not present

## 2019-12-09 DIAGNOSIS — E46 Unspecified protein-calorie malnutrition: Secondary | ICD-10-CM | POA: Diagnosis not present

## 2019-12-09 DIAGNOSIS — C320 Malignant neoplasm of glottis: Secondary | ICD-10-CM | POA: Diagnosis not present

## 2019-12-09 NOTE — Telephone Encounter (Signed)
CALLED PATIENT TO INFORM OF FU APPT. WITH DR. Isidore Moos ON 12-19-19 @ 11:40 AM, SPOKE WITH PATIENT AND HE IS AWARE OF THIS APPT.

## 2019-12-09 NOTE — Progress Notes (Signed)
  Patient Name: [name] MRN: [id number] DOB: 05/10/1931 Referring Physician: Redmond Baseman [name] (Profile Not Attached) Date of Service: 11/19/2019 [facility], [place]                                                        End Of Treatment Note  Diagnoses: C32.0-Malignant neoplasm of glottis  Cancer Staging:  Cancer Staging Malignant neoplasm of glottis [facility]) Staging form: Larynx - Glottis, AJCC 8th Edition - Clinical stage from 10/10/2019: Stage II (cT2, cN0, cM0) - Signed by Eppie Gibson, MD on 10/10/2019  Intent: Curative  Radiation Treatment Dates: 10/22/2019 through 11/19/2019  Site Technique Total Dose (Gy) Dose per Fx (Gy) Completed Fx Beam Energies  Larynx: HN_larynx 3D 54/54 2.7 20/20 6X   Narrative: The patient tolerated radiation therapy relatively well until the end of treatment.  At that point he started to develop significant dysphagia which ultimately required hospitalization and feeding tube placement.  Plan: The patient will follow-up with radiation oncology in 2 weeks (addendum: Patient was readmitted with hyperkalemia; therefore his follow-up will be rescheduled).  -----------------------------------  Eppie Gibson, MD

## 2019-12-11 DIAGNOSIS — I5022 Chronic systolic (congestive) heart failure: Secondary | ICD-10-CM | POA: Diagnosis not present

## 2019-12-11 DIAGNOSIS — C320 Malignant neoplasm of glottis: Secondary | ICD-10-CM | POA: Diagnosis not present

## 2019-12-11 DIAGNOSIS — N183 Chronic kidney disease, stage 3 unspecified: Secondary | ICD-10-CM | POA: Diagnosis not present

## 2019-12-11 DIAGNOSIS — R338 Other retention of urine: Secondary | ICD-10-CM | POA: Diagnosis not present

## 2019-12-11 DIAGNOSIS — K5900 Constipation, unspecified: Secondary | ICD-10-CM | POA: Diagnosis not present

## 2019-12-11 DIAGNOSIS — E1122 Type 2 diabetes mellitus with diabetic chronic kidney disease: Secondary | ICD-10-CM | POA: Diagnosis not present

## 2019-12-11 DIAGNOSIS — R1312 Dysphagia, oropharyngeal phase: Secondary | ICD-10-CM | POA: Diagnosis not present

## 2019-12-11 DIAGNOSIS — I130 Hypertensive heart and chronic kidney disease with heart failure and stage 1 through stage 4 chronic kidney disease, or unspecified chronic kidney disease: Secondary | ICD-10-CM | POA: Diagnosis not present

## 2019-12-11 DIAGNOSIS — N401 Enlarged prostate with lower urinary tract symptoms: Secondary | ICD-10-CM | POA: Diagnosis not present

## 2019-12-12 ENCOUNTER — Encounter: Payer: Self-pay | Admitting: Student in an Organized Health Care Education/Training Program

## 2019-12-12 ENCOUNTER — Ambulatory Visit: Payer: Medicare HMO

## 2019-12-12 NOTE — Telephone Encounter (Signed)
Patient was admitted at [facility] 3/12-3/15 for hyperkalemia. Plan to reassess heart failure diagnostics on 12/22/19 ICM transmission.

## 2019-12-16 DIAGNOSIS — R972 Elevated prostate specific antigen [PSA]: Secondary | ICD-10-CM | POA: Diagnosis not present

## 2019-12-17 NOTE — Progress Notes (Addendum)
[name] presents for follow up radiation completed 11/19/19 to his glottis.    Pain issues, if any: He denies pain Using a feeding tube?: Yes, placed 11/27/19. He reports instilling 5 nutritional supplements. He is instilling 4 large containers of water daily as well.  Weight changes, if any:  Wt Readings from Last 3 Encounters:  12/19/19 137 lb 6.4 oz (62.3 kg)  12/05/19 142 lb 9.6 oz (64.7 kg)  12/04/19 142 lb 9.6 oz (64.7 kg)   Swallowing issues, if any: He is not swallowing anything by mouth at this time.  Smoking or chewing tobacco? No Using fluoride trays daily? N/A Last ENT visit was on: He saw Dr. Redmond Baseman while in the hospital on 11/22/19 who completed a laryngoscopy.  Other notable issues, if any:  Admitted to the hospital from 11/20/19- 11/30/19 for failure to thrive, dysphagia. He was then admitted again from 12/05/19- 12/08/19 for hyperkalemia, which was presumed for tube feeding. Dietician was consulted and tube feedings were changed.   He continues to use sonafine to his radiation site twice daily.   BP (!) 108/49 (BP Location: Left Arm)   Pulse 66   Temp 99.1 F (37.3 C) (Tympanic)   Resp 20   Wt 137 lb 6.4 oz (62.3 kg)   SpO2 100%   BMI 20.89 kg/m

## 2019-12-18 ENCOUNTER — Ambulatory Visit: Payer: Medicare HMO

## 2019-12-18 ENCOUNTER — Ambulatory Visit (INDEPENDENT_AMBULATORY_CARE_PROVIDER_SITE_OTHER): Payer: Medicare HMO

## 2019-12-18 DIAGNOSIS — Z95810 Presence of automatic (implantable) cardiac defibrillator: Secondary | ICD-10-CM | POA: Diagnosis not present

## 2019-12-18 DIAGNOSIS — I5022 Chronic systolic (congestive) heart failure: Secondary | ICD-10-CM

## 2019-12-19 ENCOUNTER — Other Ambulatory Visit: Payer: Self-pay

## 2019-12-19 ENCOUNTER — Telehealth: Payer: Self-pay | Admitting: Radiation Oncology

## 2019-12-19 ENCOUNTER — Ambulatory Visit
Admission: RE | Admit: 2019-12-19 | Discharge: 2019-12-19 | Disposition: A | Discharge status: Home / Self Care | Payer: Medicare HMO | Source: Ambulatory Visit | Attending: Radiation Oncology | Admitting: Radiation Oncology

## 2019-12-19 ENCOUNTER — Encounter: Payer: Self-pay | Admitting: Radiation Oncology

## 2019-12-19 DIAGNOSIS — E785 Hyperlipidemia, unspecified: Secondary | ICD-10-CM | POA: Insufficient documentation

## 2019-12-19 DIAGNOSIS — R1310 Dysphagia, unspecified: Secondary | ICD-10-CM | POA: Insufficient documentation

## 2019-12-19 DIAGNOSIS — R972 Elevated prostate specific antigen [PSA]: Secondary | ICD-10-CM | POA: Diagnosis not present

## 2019-12-19 DIAGNOSIS — C320 Malignant neoplasm of glottis: Secondary | ICD-10-CM | POA: Diagnosis not present

## 2019-12-19 DIAGNOSIS — R220 Localized swelling, mass and lump, head: Secondary | ICD-10-CM | POA: Diagnosis not present

## 2019-12-19 DIAGNOSIS — E43 Unspecified severe protein-calorie malnutrition: Secondary | ICD-10-CM | POA: Diagnosis not present

## 2019-12-19 DIAGNOSIS — Z79899 Other long term (current) drug therapy: Secondary | ICD-10-CM | POA: Insufficient documentation

## 2019-12-19 DIAGNOSIS — N189 Chronic kidney disease, unspecified: Secondary | ICD-10-CM | POA: Insufficient documentation

## 2019-12-19 DIAGNOSIS — R1312 Dysphagia, oropharyngeal phase: Secondary | ICD-10-CM | POA: Diagnosis not present

## 2019-12-19 DIAGNOSIS — Z923 Personal history of irradiation: Secondary | ICD-10-CM | POA: Diagnosis not present

## 2019-12-19 DIAGNOSIS — Z87442 Personal history of urinary calculi: Secondary | ICD-10-CM | POA: Diagnosis not present

## 2019-12-19 DIAGNOSIS — E1122 Type 2 diabetes mellitus with diabetic chronic kidney disease: Secondary | ICD-10-CM | POA: Diagnosis not present

## 2019-12-19 DIAGNOSIS — I252 Old myocardial infarction: Secondary | ICD-10-CM | POA: Diagnosis not present

## 2019-12-19 DIAGNOSIS — Z86010 Personal history of colonic polyps: Secondary | ICD-10-CM | POA: Insufficient documentation

## 2019-12-19 DIAGNOSIS — I5022 Chronic systolic (congestive) heart failure: Secondary | ICD-10-CM | POA: Diagnosis not present

## 2019-12-19 DIAGNOSIS — Z794 Long term (current) use of insulin: Secondary | ICD-10-CM | POA: Insufficient documentation

## 2019-12-19 DIAGNOSIS — I130 Hypertensive heart and chronic kidney disease with heart failure and stage 1 through stage 4 chronic kidney disease, or unspecified chronic kidney disease: Secondary | ICD-10-CM | POA: Insufficient documentation

## 2019-12-19 DIAGNOSIS — Z431 Encounter for attention to gastrostomy: Secondary | ICD-10-CM | POA: Diagnosis not present

## 2019-12-19 DIAGNOSIS — R627 Adult failure to thrive: Secondary | ICD-10-CM | POA: Insufficient documentation

## 2019-12-19 DIAGNOSIS — E1151 Type 2 diabetes mellitus with diabetic peripheral angiopathy without gangrene: Secondary | ICD-10-CM | POA: Diagnosis not present

## 2019-12-19 DIAGNOSIS — N184 Chronic kidney disease, stage 4 (severe): Secondary | ICD-10-CM | POA: Diagnosis not present

## 2019-12-19 NOTE — Telephone Encounter (Signed)
Scheduled appt per 3/26 sch msg. Called pt, but no answer and pt does not have a voicemail. Mailed appt reminder and calendar.

## 2019-12-20 DIAGNOSIS — Z431 Encounter for attention to gastrostomy: Secondary | ICD-10-CM | POA: Diagnosis not present

## 2019-12-20 DIAGNOSIS — I5022 Chronic systolic (congestive) heart failure: Secondary | ICD-10-CM | POA: Diagnosis not present

## 2019-12-20 DIAGNOSIS — E1122 Type 2 diabetes mellitus with diabetic chronic kidney disease: Secondary | ICD-10-CM | POA: Diagnosis not present

## 2019-12-20 DIAGNOSIS — E43 Unspecified severe protein-calorie malnutrition: Secondary | ICD-10-CM | POA: Diagnosis not present

## 2019-12-20 DIAGNOSIS — R1312 Dysphagia, oropharyngeal phase: Secondary | ICD-10-CM | POA: Diagnosis not present

## 2019-12-20 DIAGNOSIS — E1151 Type 2 diabetes mellitus with diabetic peripheral angiopathy without gangrene: Secondary | ICD-10-CM | POA: Diagnosis not present

## 2019-12-20 DIAGNOSIS — N184 Chronic kidney disease, stage 4 (severe): Secondary | ICD-10-CM | POA: Diagnosis not present

## 2019-12-20 DIAGNOSIS — I130 Hypertensive heart and chronic kidney disease with heart failure and stage 1 through stage 4 chronic kidney disease, or unspecified chronic kidney disease: Secondary | ICD-10-CM | POA: Diagnosis not present

## 2019-12-20 DIAGNOSIS — C320 Malignant neoplasm of glottis: Secondary | ICD-10-CM | POA: Diagnosis not present

## 2019-12-22 DIAGNOSIS — I5022 Chronic systolic (congestive) heart failure: Secondary | ICD-10-CM | POA: Diagnosis not present

## 2019-12-22 DIAGNOSIS — N184 Chronic kidney disease, stage 4 (severe): Secondary | ICD-10-CM | POA: Diagnosis not present

## 2019-12-22 DIAGNOSIS — E43 Unspecified severe protein-calorie malnutrition: Secondary | ICD-10-CM | POA: Diagnosis not present

## 2019-12-22 DIAGNOSIS — Z431 Encounter for attention to gastrostomy: Secondary | ICD-10-CM | POA: Diagnosis not present

## 2019-12-22 DIAGNOSIS — R1312 Dysphagia, oropharyngeal phase: Secondary | ICD-10-CM | POA: Diagnosis not present

## 2019-12-22 DIAGNOSIS — E1151 Type 2 diabetes mellitus with diabetic peripheral angiopathy without gangrene: Secondary | ICD-10-CM | POA: Diagnosis not present

## 2019-12-22 DIAGNOSIS — C320 Malignant neoplasm of glottis: Secondary | ICD-10-CM | POA: Diagnosis not present

## 2019-12-22 DIAGNOSIS — I130 Hypertensive heart and chronic kidney disease with heart failure and stage 1 through stage 4 chronic kidney disease, or unspecified chronic kidney disease: Secondary | ICD-10-CM | POA: Diagnosis not present

## 2019-12-22 DIAGNOSIS — E1122 Type 2 diabetes mellitus with diabetic chronic kidney disease: Secondary | ICD-10-CM | POA: Diagnosis not present

## 2019-12-22 NOTE — Progress Notes (Signed)
EPIC Encounter for ICM Monitoring  Patient Name: [name] is a 86 y.o. male Date: 12/22/2019 Primary Care Physican: Axel Filler, MD Primary Cardiologist:Taylor Electrophysiologist: Santina Evans Pacing: 100% Last Weight:140lbs  Battery RRT 8 months  Transmission reviewed.  OptivolThoracic impedancenormal.  Prescribed:Furosemide20 mg Place 1 tablet (40 mg total) into feeding tube 2 (two) times daily.  Labs: 12/08/2019 Creatinine 1.85, BUN 46, Potassium 4.9, Sodium 135, GFR 32-37 12/07/2019 Creatinine 1.76, BUN 45, Potassium 5.8, Sodium 136, GFR 34-40 12/06/2019 Creatinine 1.71, BUN 39, Potassium 5.8, Sodium 135, GFR 35-41 A complete set of results can be found in Results Review.  Recommendations:None  Follow-up plan: ICM clinic phone appointment on4/26/2021. 91 day device clinic remote transmission 03/08/2020.   Copy of ICM check sent to Dr.Taylor  3 month ICM trend: 12/22/2019    1 Year ICM trend:       [name], RN 12/22/2019 12:32 PM

## 2019-12-23 ENCOUNTER — Encounter: Payer: Self-pay | Admitting: *Deleted

## 2019-12-23 DIAGNOSIS — E1122 Type 2 diabetes mellitus with diabetic chronic kidney disease: Secondary | ICD-10-CM | POA: Diagnosis not present

## 2019-12-23 DIAGNOSIS — E43 Unspecified severe protein-calorie malnutrition: Secondary | ICD-10-CM | POA: Diagnosis not present

## 2019-12-23 DIAGNOSIS — Z431 Encounter for attention to gastrostomy: Secondary | ICD-10-CM | POA: Diagnosis not present

## 2019-12-23 DIAGNOSIS — C320 Malignant neoplasm of glottis: Secondary | ICD-10-CM | POA: Diagnosis not present

## 2019-12-23 DIAGNOSIS — E1151 Type 2 diabetes mellitus with diabetic peripheral angiopathy without gangrene: Secondary | ICD-10-CM | POA: Diagnosis not present

## 2019-12-23 DIAGNOSIS — R1312 Dysphagia, oropharyngeal phase: Secondary | ICD-10-CM | POA: Diagnosis not present

## 2019-12-23 DIAGNOSIS — I130 Hypertensive heart and chronic kidney disease with heart failure and stage 1 through stage 4 chronic kidney disease, or unspecified chronic kidney disease: Secondary | ICD-10-CM | POA: Diagnosis not present

## 2019-12-23 DIAGNOSIS — N184 Chronic kidney disease, stage 4 (severe): Secondary | ICD-10-CM | POA: Diagnosis not present

## 2019-12-23 DIAGNOSIS — I5022 Chronic systolic (congestive) heart failure: Secondary | ICD-10-CM | POA: Diagnosis not present

## 2019-12-24 ENCOUNTER — Encounter: Payer: Self-pay | Admitting: Radiation Oncology

## 2019-12-24 DIAGNOSIS — R1312 Dysphagia, oropharyngeal phase: Secondary | ICD-10-CM | POA: Diagnosis not present

## 2019-12-24 DIAGNOSIS — E43 Unspecified severe protein-calorie malnutrition: Secondary | ICD-10-CM | POA: Diagnosis not present

## 2019-12-24 DIAGNOSIS — E1151 Type 2 diabetes mellitus with diabetic peripheral angiopathy without gangrene: Secondary | ICD-10-CM | POA: Diagnosis not present

## 2019-12-24 DIAGNOSIS — E1122 Type 2 diabetes mellitus with diabetic chronic kidney disease: Secondary | ICD-10-CM | POA: Diagnosis not present

## 2019-12-24 DIAGNOSIS — I130 Hypertensive heart and chronic kidney disease with heart failure and stage 1 through stage 4 chronic kidney disease, or unspecified chronic kidney disease: Secondary | ICD-10-CM | POA: Diagnosis not present

## 2019-12-24 DIAGNOSIS — N184 Chronic kidney disease, stage 4 (severe): Secondary | ICD-10-CM | POA: Diagnosis not present

## 2019-12-24 DIAGNOSIS — R972 Elevated prostate specific antigen [PSA]: Secondary | ICD-10-CM | POA: Diagnosis not present

## 2019-12-24 DIAGNOSIS — I5022 Chronic systolic (congestive) heart failure: Secondary | ICD-10-CM | POA: Diagnosis not present

## 2019-12-24 DIAGNOSIS — N400 Enlarged prostate without lower urinary tract symptoms: Secondary | ICD-10-CM | POA: Diagnosis not present

## 2019-12-24 DIAGNOSIS — C320 Malignant neoplasm of glottis: Secondary | ICD-10-CM | POA: Diagnosis not present

## 2019-12-24 DIAGNOSIS — Z431 Encounter for attention to gastrostomy: Secondary | ICD-10-CM | POA: Diagnosis not present

## 2019-12-24 NOTE — Progress Notes (Signed)
Oncology Nurse Navigator Documentation  Per [name] 3/26 post-treatment follow-up with Dr. Isidore Moos, sent fax to [facility] Scheduling with request he be contacted and scheduled for routine post-RT follow-up with Dr. Redmond Baseman in 2 months.  Notification of successful fax transmission received.   Gayleen Orem, RN, BSN Head & Neck Oncology Nurse [place] at [place] [phone]

## 2019-12-24 NOTE — Progress Notes (Signed)
Radiation Oncology         [phone]) [phone] ________________________________  Name: [name] MRN: [id number]  Date: 12/19/2019  DOB: 07-18-1931  Follow-Up Visit Note in person  CC: Axel Filler, MD  Axel Filler,*  Diagnosis and Prior Radiotherapy:       ICD-10-CM   1. Malignant neoplasm of glottis ([place])  C32.0 Amb Referral to Nutrition and Diabetic E    Referral to Neuro Rehab    CHIEF COMPLAINT:  Here for follow-up and surveillance of glottic cancer  Narrative:  The patient returns today for routine follow-up.   [name] presents for follow up radiation completed 11/19/19 to his glottis.   Pain issues, if any: He denies pain Using a feeding tube?: Yes, placed 11/27/19. He reports instilling 5 nutritional supplements. He is instilling 4 large containers of water daily as well.   Swallowing issues, if any: He is not swallowing anything by mouth at this time.  Smoking or chewing tobacco? No Using fluoride trays daily? N/A  Last ENT visit was on: He saw Dr. Redmond Baseman while in the hospital on 11/22/19 who completed a laryngoscopy that revealed significant edema.   Other notable issues, if any:  Admitted to the hospital from 11/20/19- 11/30/19 for failure to thrive, dysphagia. He was then admitted again from 12/05/19- 12/08/19 for hyperkalemia, which was presumed for tube feeding. Dietician was consulted and tube feedings were changed.  He has gained weight since completing radiation therapy thanks to the supportive care  He continues to use sonafine to his radiation site twice daily.       ALLERGIES:  has No Known Allergies.  Meds: Current Outpatient Medications  Medication Sig Dispense Refill  . atorvastatin (LIPITOR) 40 MG tablet TAKE 1 TABLET(40 MG) BY MOUTH DAILY AT 6 PM (Patient taking differently: Take 40 mg by mouth daily. ) 90 tablet 3  . Blood Glucose Monitoring Suppl (ACCU-CHEK AVIVA PLUS) w/Device KIT Check finger stick glucose once daily 1 kit 0  .  carvedilol (COREG) 6.25 MG tablet PLACE 1 TABLET INTO FEEDING TUBE TWICE DAILY WITH A MEAL (Patient taking differently: Take 6.25 mg by mouth 2 (two) times daily with a meal. ) 180 tablet 1  . fluorometholone (FML) 0.1 % ophthalmic suspension Place 2 drops into both eyes 2 (two) times daily.    . furosemide (LASIX) 40 MG tablet Place 1 tablet (40 mg total) into feeding tube 2 (two) times daily. 180 tablet 3  . glucose blood (ACCU-CHEK AVIVA PLUS) test strip Check blood sugar up to 3 times a day 300 each 5  . insulin glargine (LANTUS) 100 UNIT/ML injection Inject 0.08 mLs (8 Units total) into the skin daily. 10 mL 11  . Insulin Pen Needle (B-D ULTRAFINE III SHORT PEN) 31G X 8 MM MISC USE ONCE DAILY WITH LANTUS PEN 100 each 3  . Nutritional Supplements (FEEDING SUPPLEMENT, NEPRO CARB STEADY,) LIQD Place 237 mLs into feeding tube 4 (four) times daily.  0  . ofloxacin (OCUFLOX) 0.3 % ophthalmic solution Place 1 drop into both eyes 4 (four) times daily.     . pantoprazole (PROTONIX) 40 MG tablet Take 1 tablet (40 mg total) by mouth daily. 90 tablet 0  . tamsulosin (FLOMAX) 0.4 MG CAPS capsule Take 0.8 mg by mouth at bedtime.     . VENTOLIN HFA 108 (90 Base) MCG/ACT inhaler INHALE 2 PUFFS INTO THE LUNGS EVERY 6 HOURS AS NEEDED FOR WHEEZING OR SHORTNESS OF BREATH (Patient taking differently: Inhale 2  puffs into the lungs every 6 (six) hours as needed for wheezing or shortness of breath. ) 18 g 5  . Amino Acids-Protein Hydrolys (FEEDING SUPPLEMENT, PRO-STAT SUGAR FREE 64,) LIQD Place 30 mLs into feeding tube daily. (Patient not taking: Reported on 12/05/2019) 887 mL 0  . Hydrocortisone, Perianal, (PROCTO-PAK) 1 % CREA Apply 1 application topically 2 (two) times daily. (Patient not taking: Reported on 12/19/2019) 28 g 0  . lidocaine (XYLOCAINE) 2 % solution Patient: Mix 1part 2% viscous lidocaine, 1part H20. Swallow 36m of diluted mixture, 325m before meals and at bedtime, up to QID (Patient not taking: Reported  on 12/05/2019) 200 mL 3  . polyethylene glycol (MIRALAX / GLYCOLAX) 17 g packet Take 17 g by mouth daily. (Patient not taking: Reported on 12/05/2019) 14 each 0  . senna-docusate (SENOKOT-S) 8.6-50 MG tablet Place 2 tablets into feeding tube daily. (Patient not taking: Reported on 12/05/2019) 30 tablet 2   No current facility-administered medications for this encounter.    Physical Findings: The patient is in no acute distress. Patient is alert and oriented. Wt Readings from Last 3 Encounters:  12/19/19 137 lb 6.4 oz (62.3 kg)  12/05/19 142 lb 9.6 oz (64.7 kg)  12/04/19 142 lb 9.6 oz (64.7 kg)    weight is 137 lb 6.4 oz (62.3 kg). His tympanic temperature is 99.1 F (37.3 C). His blood pressure is 108/49 (abnormal) and his pulse is 66. His respiration is 20 and oxygen saturation is 100%. .  General: Alert and oriented, in no acute distress HEENT: Oral cavity and oropharynx is notable for no oral thrush Neck: Neck is notable for no palpable adenopathy Skin: Skin in treatment fields shows satisfactory healing with residual pigmentation changes from radiotherapy Abdomen: PEG site intact    Lab Findings: Lab Results  Component Value Date   WBC 7.9 12/05/2019   HGB 10.7 (L) 12/05/2019   HCT 35.7 (L) 12/05/2019   MCV 98.3 12/05/2019   PLT 149 (L) 12/05/2019    Lab Results  Component Value Date   TSH 0.986 05/21/2019    Radiographic Findings: IR GASTROSTOMY TUBE MOD SED  Result Date: 11/27/2019 INDICATION: [age]ear old male referred for percutaneous gastrostomy EXAM: PERC PLACEMENT GASTROSTOMY MEDICATIONS: 2 g Ancef; Antibiotics were administered within 1 hour of the procedure. ANESTHESIA/SEDATION: Versed 1.0 mg IV; Fentanyl 50 mcg IV Moderate Sedation Time:  13 minutes The patient was continuously monitored during the procedure by the interventional radiology nurse under my direct supervision. CONTRAST:  1556mMNIPAQUE IOHEXOL 300 MG/ML SOLN - administered into the gastric lumen.  FLUOROSCOPY TIME:  Fluoroscopy Time: 1 minutes 24 seconds (4 mGy). COMPLICATIONS: None PROCEDURE: Informed written consent was obtained from the patient and the patient's family after a thorough discussion of the procedural risks, benefits and alternatives. All questions were addressed. Maximal Sterile Barrier Technique was utilized including caps, mask, sterile gowns, sterile gloves, sterile drape, hand hygiene and skin antiseptic. A timeout was performed prior to the initiation of the procedure. The epigastrium was prepped with Betadine in a sterile fashion, and a sterile drape was applied covering the operative field. A sterile gown and sterile gloves were used for the procedure. A 5-French orogastric tube is placed under fluoroscopic guidance. Scout imaging of the abdomen confirms barium within the transverse colon. The stomach was distended with gas. Under fluoroscopic guidance, an 18 gauge needle was utilized to puncture the anterior wall of the body of the stomach. An Amplatz wire was advanced through the needle passing a T  fastener into the lumen of the stomach. The T fastener was secured for gastropexy. A 9-French sheath was inserted. A snare was advanced through the 9-French sheath. A Britta Mccreedy was advanced through the orogastric tube. It was snared then pulled out the oral cavity, pulling the snare, as well. The leading edge of the gastrostomy was attached to the snare. It was then pulled down the esophagus and out the percutaneous site. Tube secured in place. Contrast was injected. Brief oxygen desaturation at the end of the procedure was managed with jaw thrust and face mass. The patient recovered thereafter without any problems. No complications were encountered and no significant blood loss encountered. IMPRESSION: Status post fluoroscopic placed percutaneous gastrostomy tube, with 20 [place] pull-through. Signed, [name]. [name], DO [facility] Radiology  Electronically Signed   By: [name] D.O.   On: 11/27/2019 10:14   DG Foot Complete Left  Result Date: 11/24/2019 CLINICAL DATA:  Plantar foot pain EXAM: LEFT FOOT - COMPLETE 3+ VIEW COMPARISON:  None. FINDINGS: Plantar calcaneal spur. No acute bony abnormality. Specifically, no fracture, subluxation, or dislocation. Soft tissues unremarkable. IMPRESSION: No acute bony abnormality.  Calcaneal spur. Electronically Signed   By: Rolm Baptise M.D.   On: 11/24/2019 14:10   DG Swallowing Func-Speech Pathology  Result Date: 12/08/2019 Objective Swallowing Evaluation: Type of Study: MBS-Modified Barium Swallow Study  Patient Details Name: KASEEM VASTINE MRN: [id number] Date of Birth: 1931-01-03 Today's Date: 12/08/2019 Time: SLP Start Time (ACUTE ONLY): 1240 -SLP Stop Time (ACUTE ONLY): 1300 SLP Time Calculation (min) (ACUTE ONLY): 20 min Past Medical History: Past Medical History: Diagnosis Date . Adenomatous colon polyp 02/14/2012 . AICD (automatic cardioverter/defibrillator) present   Medtronic  . BBB (bundle branch block)   right . Carotid stenosis   a. s/p L carotid stent 2004;  b. Carotid US (09/2014): Bilateral ICA 1-39%, left ECA >59%, normal subclavian bilaterally, occluded left vertebral >> FU 2 years . Chronic kidney disease  . Chronic systolic CHF (congestive heart failure) (HCC)   a. ischemic CM EF 15-20%;  b. s/p AICD 05/24/04;  c. Echo 7/06: EF 30-40%, mild reduced RVSF, d. Echo 12/2015 EF 35-40% . Elevated PSA  . History of kidney stones   x 1 . HTN (hypertension)  . Hyperlipidemia  . ICD (implantable cardiac defibrillator) in place 12-25-2012  MDT CRTD upgrade by Dr Lovena Le . Myocardial infarction ([place]) 1990 . Pericardial effusion   Echocardiogram (09/2014): EF 25% with distal anterior, distal inferior, distal lateral and apical akinesis, grade 1 diastolic dysfunction, very mild aortic stenosis (mean 7 mmHg) - this may be depressed due to low EF (2-D images suggest mild to moderate aortic stenosis),  large pericardial effusion, no RA collapse . Pneumonia  . Presence of permanent cardiac pacemaker   Medtronic . PVD (peripheral vascular disease) ([place])   s/p L carotid PTCA/stent 2004 . Transient ischemic attack  . Type II diabetes mellitus ([place])   type II Past Surgical History: Past Surgical History: Procedure Laterality Date . BI-VENTRICULAR IMPLANTABLE CARDIOVERTER DEFIBRILLATOR UPGRADE N/A 12/25/2012  Procedure: BI-VENTRICULAR IMPLANTABLE CARDIOVERTER DEFIBRILLATOR UPGRADE;  Surgeon: Evans Lance, MD;  Location: [facility] CATH LAB;  Service: Cardiovascular;  Laterality: N/A; . BIV ICD UPGRADE  12/25/2012  MDT CRTD upgrade by Dr Lovena Le for ischemic cardiomyopathy and worsening conduction system disease . CARDIAC CATHETERIZATION  06/2003,  01/2004 . CARDIAC CATHETERIZATION N/A 01/13/2016  Procedure: Left Heart Cath and Coronary Angiography;  Surgeon: Jettie Booze, MD;  Location: [facility]  INVASIVE CV LAB;  Service: Cardiovascular;  Laterality: N/A; . CARDIAC DEFIBRILLATOR PLACEMENT  05/24/2004  Implantation of a MDT single-chamber defibrillator . CAROTID STENT  09/11/2003  Percutaneous transluminal angioplasty and stent placement of the left internal carotid artery. Marland Kitchen CATARACT EXTRACTION W/ INTRAOCULAR LENS  IMPLANT, BILATERAL Bilateral ~ 2010 . CORONARY ANGIOPLASTY WITH STENT PLACEMENT  1990  "2" (12/25/2012) . CYSTOSCOPY   . DIRECT LARYNGOSCOPY N/A 08/15/2019  Procedure: MICRODIRECT LARYNGOSCOPY WITH BIOPSY;  Surgeon: Melida Quitter, MD;  Location: [place];  Service: ENT;  Laterality: N/A; . ESOPHAGOGASTRODUODENOSCOPY (EGD) WITH PROPOFOL N/A 11/22/2019  Procedure: ESOPHAGOGASTRODUODENOSCOPY (EGD) WITH PROPOFOL with possible dilation;  Surgeon: Clarene Essex, MD;  Location: [place];  Service: Endoscopy;  Laterality: N/A; . INSERT / REPLACE / REMOVE PACEMAKER   . IR GASTROSTOMY TUBE MOD SED  11/27/2019 . LEAD REVISION N/A 12/25/2012  Procedure: LEAD REVISION;  Surgeon: Evans Lance, MD;  Location: [facility] CATH LAB;  Service:  Cardiovascular;  Laterality: N/A; . RIGID ESOPHAGOSCOPY N/A 08/15/2019  Procedure: RIGID ESOPHAGOSCOPY;  Surgeon: Melida Quitter, MD;  Location: [place];  Service: ENT;  Laterality: N/A; HPI: [name], 88y/m presented to ED after lab work from outpatient visit revealed low potassium level of 6/5. 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 Peg tube, type 2 diabetes, [name] III, urinary retention, malignant neoplasm of glottis, and HFrEF with EF=25% (09/2014) with a cardioverter-defibrillator. Known to Speech therapy. MBS 3/1 recommending NPO with trials of Puree, honey thick liqiuds. Pt has been NPO at home with TF through Peg. On this admission, he would like to eat by mouth if possible.  Subjective: alert and cooperative Assessment / Plan / Recommendation CHL IP CLINICAL IMPRESSIONS 12/08/2019 Clinical Impression Pt demonstrates no significant change in function since prior MBS at the beginning on the month. There continues to be moderate to severe vallecular residue due to decreased base of tongue propulsion and epiglottic deflection. Though several positional strategies were attempted, as well as a Super-supraglottic swallow, the chin tuck to the left should continued to be the most effective strategy to transit the maximum amount of bolus and decrease residue. Despite this, regardless of accuracy with strategy, or use of thickened liquids, the pt continued to have mild aspiration or residue spilling to pyriforms post swallow. Sensation was decreased and intermittent, insufficient to fully aject aspirate. In comparison with thin liquids in isolation, still using a chin tuck left, there is no benefit of honey thick liquids and perhaps even increased risk. Recommend primary use of PEG tube for nutrition, with ongoing OP SLP interventions and discussion regarding a water protocol for oral intake for comfort.  SLP Visit Diagnosis -- Attention and concentration deficit following -- Frontal lobe and  executive function deficit following -- Impact on safety and function --   CHL IP TREATMENT RECOMMENDATION 12/08/2019 Treatment Recommendations Defer treatment plan to f/u with SLP   Prognosis 12/07/2019 Prognosis for Safe Diet Advancement Fair Barriers to Reach Goals -- Barriers/Prognosis Comment -- CHL IP DIET RECOMMENDATION 12/08/2019 SLP Diet Recommendations Other (Comment) Liquid Administration via Cup;Straw Medication Administration Via alternative means Compensations Slow rate;Small sips/bites;Clear throat intermittently Postural Changes Other (Comment)   CHL IP OTHER RECOMMENDATIONS 11/24/2019 Recommended Consults -- Oral Care Recommendations Oral care BID Other Recommendations --   CHL IP FOLLOW UP RECOMMENDATIONS 12/08/2019 Follow up Recommendations Outpatient SLP   CHL IP FREQUENCY AND DURATION 11/21/2019 Speech Therapy Frequency (ACUTE ONLY) min 2x/week Treatment Duration 1 week;2 weeks  CHL IP ORAL PHASE 12/08/2019 Oral Phase WFL Oral - Pudding Teaspoon -- Oral - Pudding Cup -- Oral - Honey Teaspoon -- Oral - Honey Cup -- Oral - Nectar Teaspoon -- Oral - Nectar Cup -- Oral - Nectar Straw -- Oral - Thin Teaspoon -- Oral - Thin Cup -- Oral - Thin Straw -- Oral - Puree -- Oral - Mech Soft -- Oral - Regular -- Oral - Multi-Consistency -- Oral - Pill -- Oral Phase - Comment --  CHL IP PHARYNGEAL PHASE 12/08/2019 Pharyngeal Phase -- Pharyngeal- Pudding Teaspoon -- Pharyngeal -- Pharyngeal- Pudding Cup -- Pharyngeal -- Pharyngeal- Honey Teaspoon Reduced tongue base retraction;Reduced epiglottic inversion;Penetration/Apiration after swallow;Pharyngeal residue - valleculae Pharyngeal Material enters airway, passes BELOW cords and not ejected out despite cough attempt by patient Pharyngeal- Honey Cup Reduced tongue base retraction;Reduced epiglottic inversion;Penetration/Apiration after swallow;Pharyngeal residue - valleculae Pharyngeal Material enters airway, passes BELOW cords without attempt by patient to eject out  (silent aspiration);Material enters airway, passes BELOW cords and not ejected out despite cough attempt by patient Pharyngeal- Nectar Teaspoon NT Pharyngeal -- Pharyngeal- Nectar Cup NT Pharyngeal -- Pharyngeal- Nectar Straw Reduced tongue base retraction;Reduced epiglottic inversion;Penetration/Apiration after swallow;Pharyngeal residue - valleculae Pharyngeal Material enters airway, passes BELOW cords without attempt by patient to eject out (silent aspiration);Material enters airway, passes BELOW cords and not ejected out despite cough attempt by patient Pharyngeal- Thin Teaspoon -- Pharyngeal -- Pharyngeal- Thin Cup NT Pharyngeal -- Pharyngeal- Thin Straw Reduced tongue base retraction;Reduced epiglottic inversion;Penetration/Apiration after swallow;Pharyngeal residue - valleculae Pharyngeal Material enters airway, passes BELOW cords and not ejected out despite cough attempt by patient;Material enters airway, passes BELOW cords then ejected out Pharyngeal- Puree Reduced tongue base retraction;Reduced epiglottic inversion;Pharyngeal residue - valleculae Pharyngeal -- Pharyngeal- Mechanical Soft -- Pharyngeal -- Pharyngeal- Regular -- Pharyngeal -- Pharyngeal- Multi-consistency -- Pharyngeal -- Pharyngeal- Pill -- Pharyngeal -- Pharyngeal Comment --  No flowsheet data found. [name], MA CCC-SLP Acute Rehabilitation Services Pager [phone] Office [phone] Lynann Beaver 12/08/2019, 2:06 PM              DG Swallowing Func-Speech Pathology  Result Date: 11/24/2019 Objective Swallowing Evaluation: Type of Study: MBS-Modified Barium Swallow Study  Patient Details Name: DENALI BECVAR MRN: [id number] Date of Birth: Feb 12, 1931 Today's Date: 11/24/2019 Time: SLP Start Time (ACUTE ONLY): 4462 -SLP Stop Time (ACUTE ONLY): 1345 SLP Time Calculation (min) (ACUTE ONLY): 17 min Past Medical History: Past Medical History: Diagnosis Date . Adenomatous colon polyp 02/14/2012 . AICD (automatic  cardioverter/defibrillator) present   Medtronic  . BBB (bundle branch block)   right . Carotid stenosis   a. s/p L carotid stent 2004;  b. Carotid US (09/2014): Bilateral ICA 1-39%, left ECA >59%, normal subclavian bilaterally, occluded left vertebral >> FU 2 years . Chronic kidney disease  . Chronic systolic CHF (congestive heart failure) (HCC)   a. ischemic CM EF 15-20%;  b. s/p AICD 05/24/04;  c. Echo 7/06: EF 30-40%, mild reduced RVSF, d. Echo 12/2015 EF 35-40% . Elevated PSA  . History of kidney stones   x 1 . HTN (hypertension)  . Hyperlipidemia  . ICD (implantable cardiac defibrillator) in place 12-25-2012  MDT CRTD upgrade by Dr Lovena Le . Myocardial infarction ([place]) 1990 . Pericardial effusion   Echocardiogram (09/2014): EF 25% with distal anterior, distal inferior, distal lateral and apical akinesis, grade 1 diastolic dysfunction, very mild aortic stenosis (mean 7 mmHg) - this may be depressed due to low EF (2-D images suggest mild to moderate  aortic stenosis), large pericardial effusion, no RA collapse . Pneumonia  . Presence of permanent cardiac pacemaker   Medtronic . PVD (peripheral vascular disease) ([place])   s/p L carotid PTCA/stent 2004 . Transient ischemic attack  . Type II diabetes mellitus ([place])   type II Past Surgical History: Past Surgical History: Procedure Laterality Date . BI-VENTRICULAR IMPLANTABLE CARDIOVERTER DEFIBRILLATOR UPGRADE N/A 12/25/2012  Procedure: BI-VENTRICULAR IMPLANTABLE CARDIOVERTER DEFIBRILLATOR UPGRADE;  Surgeon: Evans Lance, MD;  Location: [facility] CATH LAB;  Service: Cardiovascular;  Laterality: N/A; . BIV ICD UPGRADE  12/25/2012  MDT CRTD upgrade by Dr Lovena Le for ischemic cardiomyopathy and worsening conduction system disease . CARDIAC CATHETERIZATION  06/2003,  01/2004 . CARDIAC CATHETERIZATION N/A 01/13/2016  Procedure: Left Heart Cath and Coronary Angiography;  Surgeon: Jettie Booze, MD;  Location: [place] CV LAB;  Service: Cardiovascular;  Laterality: N/A; . CARDIAC  DEFIBRILLATOR PLACEMENT  05/24/2004  Implantation of a MDT single-chamber defibrillator . CAROTID STENT  09/11/2003  Percutaneous transluminal angioplasty and stent placement of the left internal carotid artery. Marland Kitchen CATARACT EXTRACTION W/ INTRAOCULAR LENS  IMPLANT, BILATERAL Bilateral ~ 2010 . CORONARY ANGIOPLASTY WITH STENT PLACEMENT  1990  "2" (12/25/2012) . CYSTOSCOPY   . DIRECT LARYNGOSCOPY N/A 08/15/2019  Procedure: MICRODIRECT LARYNGOSCOPY WITH BIOPSY;  Surgeon: Melida Quitter, MD;  Location: [place];  Service: ENT;  Laterality: N/A; . ESOPHAGOGASTRODUODENOSCOPY (EGD) WITH PROPOFOL N/A 11/22/2019  Procedure: ESOPHAGOGASTRODUODENOSCOPY (EGD) WITH PROPOFOL with possible dilation;  Surgeon: Clarene Essex, MD;  Location: [place];  Service: Endoscopy;  Laterality: N/A; . INSERT / REPLACE / REMOVE PACEMAKER   . LEAD REVISION N/A 12/25/2012  Procedure: LEAD REVISION;  Surgeon: Evans Lance, MD;  Location: [facility] CATH LAB;  Service: Cardiovascular;  Laterality: N/A; . RIGID ESOPHAGOSCOPY N/A 08/15/2019  Procedure: RIGID ESOPHAGOSCOPY;  Surgeon: Melida Quitter, MD;  Location: [facility] OR;  Service: ENT;  Laterality: N/A; HPI: Pt is an 86 y.o. male admitted 11/20/19 with FTT, weight loss and difficulty swallowing. Severe throat pain. PMH: laryngeal cancer s/p 20 radiation tx (last tx 11/19/19), HTN, HLD, DM2, CHF. 06/2019 laryngoscopy  = irregular right vocal fold  and right hemilarynx with decreased movement. 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 Seen at outpatient [facility] for bedside swallow eval and for home excerise program (10/30/19).  Subjective: Pt reports he vomits whatever he eats. Assessment / Plan / Recommendation CHL IP CLINICAL IMPRESSIONS 11/24/2019 Clinical Impression Pt was seen in radiology suite for modified barium swallow study. He was alert throughout the study which was completed in the lateral projection. Trials of puree, thin liquids, nectar thick liquids, and  honey thick lqiuids were administered. Pt presents with moderate-severe pharyngeal dysphagia characterized by reduced lingual retraction, inconsistently incomplete epiglottic retroversion, reduced pharyngeal constriction, and reduced anterior laryngeal movement. He demonstrated moderate vallecular residue, mild-moderate pyriform sinus residue, mild pharyngeal wall residue, and pharyngeal delay. Penetration and sensed aspiration (PAS 3,5,6) were noted with all liquid consistencies. A chin tuck posture was effective in eliminating immediate penetration/aspiration of honey thick liquids but penetration and aspiration of material from the pyriform sinuses was noted after the swallow. Postural modifications were also attempted with other liquids but with no functional benefit. A chin tuck posture with left head turn was initially effective in reducing pharyngeal residue to a more functional level with puree solids but this strategy became increasing less effective with subsequent boluses. Pt is at moderate to high risk for aspiration at this time before,  during, and after deglutition and he reported at the end of the study that eating has become notably tiring. Considering his currently presentation combined with his reported weight loss, long-term non-oral alimentation is recommended at this time with supervised pleasure feedings. Pt was reduced regarding results and recommendation including that of non-oral alimentation. He verbalized understanding as well as agreement with recommendations. However, should pt subsequently assume the refuse this and wish to have a p.o. diet, the safest diet would be puree with honey thick liquids. Compensatory strategies would include reduced bolus sizes, a liquid wash, and a chin tuck posture with left head turn; however, the risk of aspiration would remain. SLP will follow for treatment.  SLP Visit Diagnosis Dysphagia, pharyngeal phase (R13.13) Attention and concentration deficit  following -- Frontal lobe and executive function deficit following -- Impact on safety and function Moderate aspiration risk;Severe aspiration risk   CHL IP TREATMENT RECOMMENDATION 11/21/2019 Treatment Recommendations Therapy as outlined in treatment plan below   Prognosis 11/24/2019 Prognosis for Safe Diet Advancement Fair Barriers to Reach Goals -- Barriers/Prognosis Comment laryngeal cancer and radiation treatment CHL IP DIET RECOMMENDATION 11/24/2019 SLP Diet Recommendations NPO;Alternative means - long-term Liquid Administration via -- Medication Administration Via alternative means Compensations -- Postural Changes --   [facility] IP OTHER RECOMMENDATIONS 11/24/2019 Recommended Consults -- Oral Care Recommendations Oral care BID Other Recommendations --   [facility]L IP FOLLOW UP RECOMMENDATIONS 11/24/2019 Follow up Recommendations Other (comment)   CHL IP FREQUENCY AND DURATION 11/21/2019 Speech Therapy Frequency (ACUTE ONLY) min 2x/week Treatment Duration 1 week;2 weeks      [facility]L IP ORAL PHASE 05/21/2019 Oral Phase Impaired Oral - Pudding Teaspoon -- Oral - Pudding Cup -- Oral - Honey Teaspoon -- Oral - Honey Cup -- Oral - Nectar Teaspoon -- Oral - Nectar Cup -- Oral - Nectar Straw Lingual pumping;Reduced posterior propulsion;Piecemeal swallowing;Delayed oral transit;Decreased bolus cohesion Oral - Thin Teaspoon -- Oral - Thin Cup -- Oral - Thin Straw Lingual pumping;Reduced posterior propulsion;Piecemeal swallowing;Delayed oral transit;Decreased bolus cohesion Oral - Puree Lingual pumping;Reduced posterior propulsion;Piecemeal swallowing;Delayed oral transit;Decreased bolus cohesion Oral - Mech Soft Lingual pumping;Reduced posterior propulsion;Piecemeal swallowing;Delayed oral transit;Decreased bolus cohesion Oral - Regular -- Oral - Multi-Consistency -- Oral - Pill -- Oral Phase - Comment --  CHL IP PHARYNGEAL PHASE 11/24/2019 Pharyngeal Phase Impaired Pharyngeal- Pudding Teaspoon -- Pharyngeal -- Pharyngeal- Pudding Cup --  Pharyngeal -- Pharyngeal- Honey Teaspoon -- Pharyngeal -- Pharyngeal- Honey Cup Delayed swallow initiation-pyriform sinuses;Reduced epiglottic inversion;Reduced anterior laryngeal mobility;Reduced tongue base retraction;Penetration/Aspiration before swallow;Penetration/Apiration after swallow;Trace aspiration;Compensatory strategies attempted (with notebox) Pharyngeal Material enters airway, passes BELOW cords then ejected out;Material enters airway, remains ABOVE vocal cords and not ejected out;Material enters airway, CONTACTS cords and not ejected out Pharyngeal- Nectar Teaspoon -- Pharyngeal Material enters airway, passes BELOW cords then ejected out;Material enters airway, remains ABOVE vocal cords and not ejected out;Material enters airway, CONTACTS cords and not ejected out Pharyngeal- Nectar Cup Delayed swallow initiation-pyriform sinuses;Reduced epiglottic inversion;Reduced anterior laryngeal mobility;Reduced tongue base retraction;Penetration/Aspiration before swallow;Penetration/Apiration after swallow;Trace aspiration Pharyngeal Material enters airway, passes BELOW cords then ejected out;Material enters airway, remains ABOVE vocal cords and not ejected out;Material enters airway, CONTACTS cords and not ejected out Pharyngeal- Nectar Straw -- Pharyngeal -- Pharyngeal- Thin Teaspoon -- Pharyngeal -- Pharyngeal- Thin Cup Delayed swallow initiation-pyriform sinuses;Reduced epiglottic inversion;Reduced anterior laryngeal mobility;Reduced tongue base retraction;Penetration/Aspiration before swallow;Penetration/Apiration after swallow;Trace aspiration Pharyngeal Material enters airway, passes BELOW cords then ejected out;Material enters airway, passes BELOW cords and not ejected out despite  cough attempt by patient Pharyngeal- Thin Straw -- Pharyngeal -- Pharyngeal- Puree Delayed swallow initiation-pyriform sinuses;Reduced epiglottic inversion;Reduced anterior laryngeal mobility;Reduced tongue base retraction  Pharyngeal -- Pharyngeal- Mechanical Soft -- Pharyngeal -- Pharyngeal- Regular -- Pharyngeal -- Pharyngeal- Multi-consistency -- Pharyngeal -- Pharyngeal- Pill -- Pharyngeal -- Pharyngeal Comment --  No flowsheet data found. Shanika I. Hardin Negus, [place], [place] Office number [phone] Pager [place] 11/24/2019, 4:11 PM              CUP PACEART REMOTE DEVICE CHECK  Result Date: 12/03/2019 Scheduled remote reviewed. Normal device function.  OptiVol indicates possible fluid retention. Will forward to triage. ERI estimated in 8 months. Will continue monthly remotes for battery monitoring. Felisa Bonier, MSN, RN Scheduled remote reviewed. Normal device function.  OptiVol indicates possible fluid retention. Will forward to triage. ERI estimated in 8 months. Will continue monthly remotes for battery monitoring. Felisa Bonier, MSN, RN Pt is followed in [facility], next transmission on 12/22/19. Attempted to reach pt, no answer, unable to LM. See phone note from 12/03/19. 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  I have referred him back to nutrition PEG tube: Using.  3) Risk Factors: The patient has been educated about risk factors including alcohol and tobacco abuse; they understand that avoidance of alcohol and tobacco is important to prevent recurrences as well as other cancers  4) Swallowing: He has significant dysphagia.  I have referred him back to swallowing therapy  5)  Thyroid function: Check annually Lab Results  Component Value Date   TSH 0.986 05/21/2019    6)  Follow-up in 3 months. The patient was encouraged to call with any issues or questions before then.  Gayleen Orem, RN, our Head and Neck  Oncology Navigator is ensuring that the patient has follow-up with otolaryngology within the next 2 months.  At that time he may benefit from repeat laryngoscopy to survey his tumor site.  On date of service, in total, I spent 25 minutes on this encounter.   _________________________   Eppie Gibson, MD

## 2019-12-25 DIAGNOSIS — N184 Chronic kidney disease, stage 4 (severe): Secondary | ICD-10-CM | POA: Diagnosis not present

## 2019-12-25 DIAGNOSIS — C320 Malignant neoplasm of glottis: Secondary | ICD-10-CM | POA: Diagnosis not present

## 2019-12-25 DIAGNOSIS — E1151 Type 2 diabetes mellitus with diabetic peripheral angiopathy without gangrene: Secondary | ICD-10-CM | POA: Diagnosis not present

## 2019-12-25 DIAGNOSIS — E1122 Type 2 diabetes mellitus with diabetic chronic kidney disease: Secondary | ICD-10-CM | POA: Diagnosis not present

## 2019-12-25 DIAGNOSIS — I5022 Chronic systolic (congestive) heart failure: Secondary | ICD-10-CM | POA: Diagnosis not present

## 2019-12-25 DIAGNOSIS — R1312 Dysphagia, oropharyngeal phase: Secondary | ICD-10-CM | POA: Diagnosis not present

## 2019-12-25 DIAGNOSIS — E43 Unspecified severe protein-calorie malnutrition: Secondary | ICD-10-CM | POA: Diagnosis not present

## 2019-12-25 DIAGNOSIS — I130 Hypertensive heart and chronic kidney disease with heart failure and stage 1 through stage 4 chronic kidney disease, or unspecified chronic kidney disease: Secondary | ICD-10-CM | POA: Diagnosis not present

## 2019-12-25 DIAGNOSIS — Z431 Encounter for attention to gastrostomy: Secondary | ICD-10-CM | POA: Diagnosis not present

## 2019-12-30 ENCOUNTER — Telehealth: Payer: Self-pay

## 2019-12-30 DIAGNOSIS — E43 Unspecified severe protein-calorie malnutrition: Secondary | ICD-10-CM | POA: Diagnosis not present

## 2019-12-30 DIAGNOSIS — Z431 Encounter for attention to gastrostomy: Secondary | ICD-10-CM | POA: Diagnosis not present

## 2019-12-30 DIAGNOSIS — E1151 Type 2 diabetes mellitus with diabetic peripheral angiopathy without gangrene: Secondary | ICD-10-CM | POA: Diagnosis not present

## 2019-12-30 DIAGNOSIS — R1312 Dysphagia, oropharyngeal phase: Secondary | ICD-10-CM | POA: Diagnosis not present

## 2019-12-30 DIAGNOSIS — N184 Chronic kidney disease, stage 4 (severe): Secondary | ICD-10-CM | POA: Diagnosis not present

## 2019-12-30 DIAGNOSIS — E1122 Type 2 diabetes mellitus with diabetic chronic kidney disease: Secondary | ICD-10-CM | POA: Diagnosis not present

## 2019-12-30 DIAGNOSIS — I130 Hypertensive heart and chronic kidney disease with heart failure and stage 1 through stage 4 chronic kidney disease, or unspecified chronic kidney disease: Secondary | ICD-10-CM | POA: Diagnosis not present

## 2019-12-30 DIAGNOSIS — I5022 Chronic systolic (congestive) heart failure: Secondary | ICD-10-CM | POA: Diagnosis not present

## 2019-12-30 DIAGNOSIS — C320 Malignant neoplasm of glottis: Secondary | ICD-10-CM | POA: Diagnosis not present

## 2019-12-30 NOTE — Telephone Encounter (Signed)
LMOVM for pt to stop sending manual transmissions. I left my direct office number for the pt to call me to get education on monitor.

## 2019-12-31 ENCOUNTER — Encounter: Payer: Self-pay | Admitting: *Deleted

## 2019-12-31 ENCOUNTER — Inpatient Hospital Stay: Payer: Medicare HMO | Attending: Radiation Oncology | Admitting: Nutrition

## 2019-12-31 ENCOUNTER — Other Ambulatory Visit: Payer: Self-pay

## 2019-12-31 ENCOUNTER — Encounter: Payer: Self-pay | Admitting: Radiation Oncology

## 2019-12-31 ENCOUNTER — Inpatient Hospital Stay: Payer: Medicare HMO | Admitting: Nutrition

## 2019-12-31 ENCOUNTER — Encounter: Payer: Self-pay | Admitting: Nutrition

## 2019-12-31 NOTE — Progress Notes (Signed)
Patient did not show up for nutrition appointment.  Contacted patient at cell phone number and left a message for him to return call.  Left my name and contact information.

## 2019-12-31 NOTE — Progress Notes (Signed)
11/30/2019 at 1306. Contacted by Ernestene Kiel. Pamala Hurry counseled the patient today per Dr. Pearlie Oyster order. Pamala Hurry reports the patient complains of an open sore on his hip, white coating on his tongue and a leaky feeding tube. Vitals stable. White coating noted on tongue is plaque. Patient takes nothing by mouth but hasn't been doing oral care. Provided patient with soft toothbrush and toothpaste. Instructed patient to brush his tongue twice per day. Patient verbalized understanding. Palpated left posterior sacral region where patient complained of pain. No broken or blanching skin noted. No nodules palpated. Suspect this bony prominence becomes painful with prolonged sitting and no cushion. Advised patient to ambulate and turn more often to relieve pressure on left sacral area. Provided patient with bone pillow to sit on. Scant bloody show noted around feeding tube. Patient reports Jackquline Denmark comes every Thursday to manage his tube. No leakage noted from tube, foul odor or signs of infection. Tube care reviewed again with patient by Gayleen Orem, RN. Patient denies nausea, vomiting, headache or dizziness. Patient verbalizes he feels well and enjoys watching westerns during the day. Patient denies feeling as though he needs further evaluation in the er. Patient discharged home with son in no distress.   Temperature:  99.2 Pulse:    66 Respirations:     18 Blood pressure: 118/60 Pulse Ox: 100%

## 2019-12-31 NOTE — Progress Notes (Signed)
Oncology Nurse Navigator Documentation  Following Mr. Biedermann' Nutrition appt, assessed his PEG site to address his concerns that it "is leaking".  Evidence of scant amount of grayish, blood-tinged discharge at insertion area.  Upon removal, insertion reflected same, and with this exception, appeared WNL. I cleaned site with normal saline, applied clean split gauze/drainage sponge, reminded him single gauze is all that is needed vs the 2 he had placed. Provided written instructions for PEG Care. Provided him box of split gauze and bottle of NS used to clean site, encouraged him to call with further concerns.  Gayleen Orem, RN, BSN Head & Neck Oncology Nurse [facility] [place] [phone]

## 2019-12-31 NOTE — Progress Notes (Signed)
Patient showed up 50 minutes late for appointment.  Reports transportation problems. I worked patient in so that I could see him today.  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  He is a patient of Dr. Isidore Moos. Current weight documented as 139 pounds down slightly from 141.6 pounds February 10.  BMI is within normal limits however patient is 77% of his usual body weight. Noted patient weighed 137.4 pounds March 26 so current weight has improved slightly Patient had a diagnosis of severe malnutrition during recent hospitalization.  He had a PEG placed and started on tube feeding which was recently changed to Nepro, 4 cartons daily with 30 mL Prosource daily via PEG. This provides 1875 cal, 90 g protein, 905 mL free water daily meeting 100% minimum estimated needs. Patient denies nausea, vomiting, constipation, diarrhea. Labs reviewed from March 15: Potassium 4.9, glucose 174, BUN 46, and creatinine 1.85. Patient complains of white spots on his tongue. Also reports feeding tube is leaking and his left hip is painful and has an open sore on it.  Estimated nutrition needs: 1800-2000 cal, 80-100 g protein, 2 L fluid.  Nutrition diagnosis: Unintended weight loss continues.  Intervention: Continue 4 cartons of Nepro with 30 mL Prosource via PEG daily. Continue free water flushes. Diet per MD. Notify provider regarding patient complaints. Will monitor provider's assessment today and increase tube feedings/protein as needed to assist with wound healing if needed.  Monitoring, evaluation, goals: Patient will continue to tolerate tube feeding at goal rate to minimize further weight loss.  Next visit: To be scheduled as needed.  **Disclaimer: This note was dictated with voice recognition software. Similar sounding words can inadvertently be transcribed and this note may contain transcription errors which may not have been corrected upon publication  of note.**

## 2020-01-01 DIAGNOSIS — Z431 Encounter for attention to gastrostomy: Secondary | ICD-10-CM | POA: Diagnosis not present

## 2020-01-01 DIAGNOSIS — E43 Unspecified severe protein-calorie malnutrition: Secondary | ICD-10-CM | POA: Diagnosis not present

## 2020-01-01 DIAGNOSIS — N184 Chronic kidney disease, stage 4 (severe): Secondary | ICD-10-CM | POA: Diagnosis not present

## 2020-01-01 DIAGNOSIS — E1151 Type 2 diabetes mellitus with diabetic peripheral angiopathy without gangrene: Secondary | ICD-10-CM | POA: Diagnosis not present

## 2020-01-01 DIAGNOSIS — C320 Malignant neoplasm of glottis: Secondary | ICD-10-CM | POA: Diagnosis not present

## 2020-01-01 DIAGNOSIS — I5022 Chronic systolic (congestive) heart failure: Secondary | ICD-10-CM | POA: Diagnosis not present

## 2020-01-01 DIAGNOSIS — I130 Hypertensive heart and chronic kidney disease with heart failure and stage 1 through stage 4 chronic kidney disease, or unspecified chronic kidney disease: Secondary | ICD-10-CM | POA: Diagnosis not present

## 2020-01-01 DIAGNOSIS — E1122 Type 2 diabetes mellitus with diabetic chronic kidney disease: Secondary | ICD-10-CM | POA: Diagnosis not present

## 2020-01-01 DIAGNOSIS — R1312 Dysphagia, oropharyngeal phase: Secondary | ICD-10-CM | POA: Diagnosis not present

## 2020-01-02 DIAGNOSIS — E1122 Type 2 diabetes mellitus with diabetic chronic kidney disease: Secondary | ICD-10-CM | POA: Diagnosis not present

## 2020-01-02 DIAGNOSIS — E43 Unspecified severe protein-calorie malnutrition: Secondary | ICD-10-CM | POA: Diagnosis not present

## 2020-01-02 DIAGNOSIS — I130 Hypertensive heart and chronic kidney disease with heart failure and stage 1 through stage 4 chronic kidney disease, or unspecified chronic kidney disease: Secondary | ICD-10-CM | POA: Diagnosis not present

## 2020-01-02 DIAGNOSIS — I5022 Chronic systolic (congestive) heart failure: Secondary | ICD-10-CM | POA: Diagnosis not present

## 2020-01-02 DIAGNOSIS — C320 Malignant neoplasm of glottis: Secondary | ICD-10-CM | POA: Diagnosis not present

## 2020-01-02 DIAGNOSIS — N184 Chronic kidney disease, stage 4 (severe): Secondary | ICD-10-CM | POA: Diagnosis not present

## 2020-01-02 DIAGNOSIS — Z431 Encounter for attention to gastrostomy: Secondary | ICD-10-CM | POA: Diagnosis not present

## 2020-01-02 DIAGNOSIS — E1151 Type 2 diabetes mellitus with diabetic peripheral angiopathy without gangrene: Secondary | ICD-10-CM | POA: Diagnosis not present

## 2020-01-02 DIAGNOSIS — R1312 Dysphagia, oropharyngeal phase: Secondary | ICD-10-CM | POA: Diagnosis not present

## 2020-01-05 ENCOUNTER — Telehealth: Payer: Self-pay

## 2020-01-05 ENCOUNTER — Ambulatory Visit (INDEPENDENT_AMBULATORY_CARE_PROVIDER_SITE_OTHER): Payer: Medicare HMO | Admitting: *Deleted

## 2020-01-05 DIAGNOSIS — C320 Malignant neoplasm of glottis: Secondary | ICD-10-CM | POA: Diagnosis not present

## 2020-01-05 DIAGNOSIS — E43 Unspecified severe protein-calorie malnutrition: Secondary | ICD-10-CM | POA: Diagnosis not present

## 2020-01-05 DIAGNOSIS — N184 Chronic kidney disease, stage 4 (severe): Secondary | ICD-10-CM | POA: Diagnosis not present

## 2020-01-05 DIAGNOSIS — I130 Hypertensive heart and chronic kidney disease with heart failure and stage 1 through stage 4 chronic kidney disease, or unspecified chronic kidney disease: Secondary | ICD-10-CM | POA: Diagnosis not present

## 2020-01-05 DIAGNOSIS — I5022 Chronic systolic (congestive) heart failure: Secondary | ICD-10-CM | POA: Diagnosis not present

## 2020-01-05 DIAGNOSIS — Z431 Encounter for attention to gastrostomy: Secondary | ICD-10-CM | POA: Diagnosis not present

## 2020-01-05 DIAGNOSIS — E1151 Type 2 diabetes mellitus with diabetic peripheral angiopathy without gangrene: Secondary | ICD-10-CM | POA: Diagnosis not present

## 2020-01-05 DIAGNOSIS — R1312 Dysphagia, oropharyngeal phase: Secondary | ICD-10-CM | POA: Diagnosis not present

## 2020-01-05 DIAGNOSIS — Z95810 Presence of automatic (implantable) cardiac defibrillator: Secondary | ICD-10-CM | POA: Diagnosis not present

## 2020-01-05 DIAGNOSIS — E1122 Type 2 diabetes mellitus with diabetic chronic kidney disease: Secondary | ICD-10-CM | POA: Diagnosis not present

## 2020-01-05 NOTE — Telephone Encounter (Signed)
Phone number disconnected

## 2020-01-06 DIAGNOSIS — I5022 Chronic systolic (congestive) heart failure: Secondary | ICD-10-CM | POA: Diagnosis not present

## 2020-01-06 DIAGNOSIS — E1122 Type 2 diabetes mellitus with diabetic chronic kidney disease: Secondary | ICD-10-CM | POA: Diagnosis not present

## 2020-01-06 DIAGNOSIS — R1312 Dysphagia, oropharyngeal phase: Secondary | ICD-10-CM | POA: Diagnosis not present

## 2020-01-06 DIAGNOSIS — E43 Unspecified severe protein-calorie malnutrition: Secondary | ICD-10-CM | POA: Diagnosis not present

## 2020-01-06 DIAGNOSIS — Z431 Encounter for attention to gastrostomy: Secondary | ICD-10-CM | POA: Diagnosis not present

## 2020-01-06 DIAGNOSIS — N184 Chronic kidney disease, stage 4 (severe): Secondary | ICD-10-CM | POA: Diagnosis not present

## 2020-01-06 DIAGNOSIS — C320 Malignant neoplasm of glottis: Secondary | ICD-10-CM | POA: Diagnosis not present

## 2020-01-06 DIAGNOSIS — I130 Hypertensive heart and chronic kidney disease with heart failure and stage 1 through stage 4 chronic kidney disease, or unspecified chronic kidney disease: Secondary | ICD-10-CM | POA: Diagnosis not present

## 2020-01-06 DIAGNOSIS — E1151 Type 2 diabetes mellitus with diabetic peripheral angiopathy without gangrene: Secondary | ICD-10-CM | POA: Diagnosis not present

## 2020-01-06 LAB — CUP PACEART REMOTE DEVICE CHECK
Battery Remaining Longevity: 8 mo
Battery Voltage: 2.84 V
Brady Statistic AP VP Percent: 0.23 %
Brady Statistic AP VS Percent: 0.01 %
Brady Statistic AS VP Percent: 99.69 %
Brady Statistic AS VS Percent: 0.07 %
Brady Statistic RA Percent Paced: 0.24 %
Brady Statistic RV Percent Paced: 99.86 %
Date Time Interrogation Session: 20210412022725
HighPow Impedance: 47 Ohm
Implantable Lead Implant Date: 20140402
Implantable Lead Implant Date: 20140402
Implantable Lead Implant Date: 20140402
Implantable Lead Location: 753858
Implantable Lead Location: 753859
Implantable Lead Location: 753860
Implantable Lead Model: 4194
Implantable Lead Model: 5076
Implantable Lead Model: 6935
Implantable Pulse Generator Implant Date: 20140402
Lead Channel Impedance Value: 228 Ohm
Lead Channel Impedance Value: 342 Ohm
Lead Channel Impedance Value: 342 Ohm
Lead Channel Impedance Value: 399 Ohm
Lead Channel Impedance Value: 456 Ohm
Lead Channel Impedance Value: 456 Ohm
Lead Channel Pacing Threshold Amplitude: 0.75 V
Lead Channel Pacing Threshold Amplitude: 0.875 V
Lead Channel Pacing Threshold Amplitude: 0.875 V
Lead Channel Pacing Threshold Pulse Width: 0.4 ms
Lead Channel Pacing Threshold Pulse Width: 0.4 ms
Lead Channel Pacing Threshold Pulse Width: 0.6 ms
Lead Channel Sensing Intrinsic Amplitude: 1.75 mV
Lead Channel Sensing Intrinsic Amplitude: 1.75 mV
Lead Channel Sensing Intrinsic Amplitude: 12.75 mV
Lead Channel Sensing Intrinsic Amplitude: 12.75 mV
Lead Channel Setting Pacing Amplitude: 1.5 V
Lead Channel Setting Pacing Amplitude: 2 V
Lead Channel Setting Pacing Amplitude: 2.5 V
Lead Channel Setting Pacing Pulse Width: 0.4 ms
Lead Channel Setting Pacing Pulse Width: 0.6 ms
Lead Channel Setting Sensing Sensitivity: 0.3 mV

## 2020-01-07 DIAGNOSIS — Z431 Encounter for attention to gastrostomy: Secondary | ICD-10-CM | POA: Diagnosis not present

## 2020-01-07 DIAGNOSIS — C320 Malignant neoplasm of glottis: Secondary | ICD-10-CM | POA: Diagnosis not present

## 2020-01-07 DIAGNOSIS — I130 Hypertensive heart and chronic kidney disease with heart failure and stage 1 through stage 4 chronic kidney disease, or unspecified chronic kidney disease: Secondary | ICD-10-CM | POA: Diagnosis not present

## 2020-01-07 DIAGNOSIS — R1312 Dysphagia, oropharyngeal phase: Secondary | ICD-10-CM | POA: Diagnosis not present

## 2020-01-07 DIAGNOSIS — I5022 Chronic systolic (congestive) heart failure: Secondary | ICD-10-CM | POA: Diagnosis not present

## 2020-01-07 DIAGNOSIS — E1151 Type 2 diabetes mellitus with diabetic peripheral angiopathy without gangrene: Secondary | ICD-10-CM | POA: Diagnosis not present

## 2020-01-07 DIAGNOSIS — N184 Chronic kidney disease, stage 4 (severe): Secondary | ICD-10-CM | POA: Diagnosis not present

## 2020-01-07 DIAGNOSIS — E43 Unspecified severe protein-calorie malnutrition: Secondary | ICD-10-CM | POA: Diagnosis not present

## 2020-01-07 DIAGNOSIS — E1122 Type 2 diabetes mellitus with diabetic chronic kidney disease: Secondary | ICD-10-CM | POA: Diagnosis not present

## 2020-01-08 DIAGNOSIS — R1310 Dysphagia, unspecified: Secondary | ICD-10-CM | POA: Diagnosis not present

## 2020-01-08 DIAGNOSIS — E46 Unspecified protein-calorie malnutrition: Secondary | ICD-10-CM | POA: Diagnosis not present

## 2020-01-08 DIAGNOSIS — C320 Malignant neoplasm of glottis: Secondary | ICD-10-CM | POA: Diagnosis not present

## 2020-01-09 DIAGNOSIS — I5022 Chronic systolic (congestive) heart failure: Secondary | ICD-10-CM | POA: Diagnosis not present

## 2020-01-09 DIAGNOSIS — R1312 Dysphagia, oropharyngeal phase: Secondary | ICD-10-CM | POA: Diagnosis not present

## 2020-01-09 DIAGNOSIS — I130 Hypertensive heart and chronic kidney disease with heart failure and stage 1 through stage 4 chronic kidney disease, or unspecified chronic kidney disease: Secondary | ICD-10-CM | POA: Diagnosis not present

## 2020-01-09 DIAGNOSIS — C320 Malignant neoplasm of glottis: Secondary | ICD-10-CM | POA: Diagnosis not present

## 2020-01-09 DIAGNOSIS — N184 Chronic kidney disease, stage 4 (severe): Secondary | ICD-10-CM | POA: Diagnosis not present

## 2020-01-09 DIAGNOSIS — E1151 Type 2 diabetes mellitus with diabetic peripheral angiopathy without gangrene: Secondary | ICD-10-CM | POA: Diagnosis not present

## 2020-01-09 DIAGNOSIS — E43 Unspecified severe protein-calorie malnutrition: Secondary | ICD-10-CM | POA: Diagnosis not present

## 2020-01-09 DIAGNOSIS — Z431 Encounter for attention to gastrostomy: Secondary | ICD-10-CM | POA: Diagnosis not present

## 2020-01-09 DIAGNOSIS — E1122 Type 2 diabetes mellitus with diabetic chronic kidney disease: Secondary | ICD-10-CM | POA: Diagnosis not present

## 2020-01-13 DIAGNOSIS — E1151 Type 2 diabetes mellitus with diabetic peripheral angiopathy without gangrene: Secondary | ICD-10-CM | POA: Diagnosis not present

## 2020-01-13 DIAGNOSIS — E1122 Type 2 diabetes mellitus with diabetic chronic kidney disease: Secondary | ICD-10-CM | POA: Diagnosis not present

## 2020-01-13 DIAGNOSIS — Z431 Encounter for attention to gastrostomy: Secondary | ICD-10-CM | POA: Diagnosis not present

## 2020-01-13 DIAGNOSIS — I130 Hypertensive heart and chronic kidney disease with heart failure and stage 1 through stage 4 chronic kidney disease, or unspecified chronic kidney disease: Secondary | ICD-10-CM | POA: Diagnosis not present

## 2020-01-13 DIAGNOSIS — C320 Malignant neoplasm of glottis: Secondary | ICD-10-CM | POA: Diagnosis not present

## 2020-01-13 DIAGNOSIS — R1312 Dysphagia, oropharyngeal phase: Secondary | ICD-10-CM | POA: Diagnosis not present

## 2020-01-13 DIAGNOSIS — I5022 Chronic systolic (congestive) heart failure: Secondary | ICD-10-CM | POA: Diagnosis not present

## 2020-01-13 DIAGNOSIS — N184 Chronic kidney disease, stage 4 (severe): Secondary | ICD-10-CM | POA: Diagnosis not present

## 2020-01-13 DIAGNOSIS — E43 Unspecified severe protein-calorie malnutrition: Secondary | ICD-10-CM | POA: Diagnosis not present

## 2020-01-16 ENCOUNTER — Telehealth: Payer: Self-pay | Admitting: *Deleted

## 2020-01-16 ENCOUNTER — Other Ambulatory Visit: Payer: Self-pay | Admitting: Student in an Organized Health Care Education/Training Program

## 2020-01-16 DIAGNOSIS — E43 Unspecified severe protein-calorie malnutrition: Secondary | ICD-10-CM | POA: Diagnosis not present

## 2020-01-16 DIAGNOSIS — N184 Chronic kidney disease, stage 4 (severe): Secondary | ICD-10-CM | POA: Diagnosis not present

## 2020-01-16 DIAGNOSIS — I5022 Chronic systolic (congestive) heart failure: Secondary | ICD-10-CM | POA: Diagnosis not present

## 2020-01-16 DIAGNOSIS — E1122 Type 2 diabetes mellitus with diabetic chronic kidney disease: Secondary | ICD-10-CM | POA: Diagnosis not present

## 2020-01-16 DIAGNOSIS — E1151 Type 2 diabetes mellitus with diabetic peripheral angiopathy without gangrene: Secondary | ICD-10-CM | POA: Diagnosis not present

## 2020-01-16 DIAGNOSIS — C320 Malignant neoplasm of glottis: Secondary | ICD-10-CM | POA: Diagnosis not present

## 2020-01-16 DIAGNOSIS — R1312 Dysphagia, oropharyngeal phase: Secondary | ICD-10-CM | POA: Diagnosis not present

## 2020-01-16 DIAGNOSIS — I130 Hypertensive heart and chronic kidney disease with heart failure and stage 1 through stage 4 chronic kidney disease, or unspecified chronic kidney disease: Secondary | ICD-10-CM | POA: Diagnosis not present

## 2020-01-16 DIAGNOSIS — Z431 Encounter for attention to gastrostomy: Secondary | ICD-10-CM | POA: Diagnosis not present

## 2020-01-16 NOTE — Telephone Encounter (Signed)
[facility] Speech therapist calls and ask for an emergent HHN visit- it will be done today, for possible separation area of PEG tube also for a CSW visit for community services and safety issues- pt does not have anyone in the home to assist him VO given, do you agree?

## 2020-01-16 NOTE — Telephone Encounter (Signed)
Yes, agree. Thank you.

## 2020-01-18 DIAGNOSIS — C320 Malignant neoplasm of glottis: Secondary | ICD-10-CM | POA: Diagnosis not present

## 2020-01-18 DIAGNOSIS — E1122 Type 2 diabetes mellitus with diabetic chronic kidney disease: Secondary | ICD-10-CM | POA: Diagnosis not present

## 2020-01-18 DIAGNOSIS — N184 Chronic kidney disease, stage 4 (severe): Secondary | ICD-10-CM | POA: Diagnosis not present

## 2020-01-18 DIAGNOSIS — I130 Hypertensive heart and chronic kidney disease with heart failure and stage 1 through stage 4 chronic kidney disease, or unspecified chronic kidney disease: Secondary | ICD-10-CM | POA: Diagnosis not present

## 2020-01-18 DIAGNOSIS — I5022 Chronic systolic (congestive) heart failure: Secondary | ICD-10-CM | POA: Diagnosis not present

## 2020-01-18 DIAGNOSIS — E43 Unspecified severe protein-calorie malnutrition: Secondary | ICD-10-CM | POA: Diagnosis not present

## 2020-01-18 DIAGNOSIS — R1312 Dysphagia, oropharyngeal phase: Secondary | ICD-10-CM | POA: Diagnosis not present

## 2020-01-18 DIAGNOSIS — E1151 Type 2 diabetes mellitus with diabetic peripheral angiopathy without gangrene: Secondary | ICD-10-CM | POA: Diagnosis not present

## 2020-01-18 DIAGNOSIS — Z431 Encounter for attention to gastrostomy: Secondary | ICD-10-CM | POA: Diagnosis not present

## 2020-01-19 ENCOUNTER — Ambulatory Visit (INDEPENDENT_AMBULATORY_CARE_PROVIDER_SITE_OTHER): Payer: Medicare HMO

## 2020-01-19 DIAGNOSIS — Z95810 Presence of automatic (implantable) cardiac defibrillator: Secondary | ICD-10-CM

## 2020-01-19 DIAGNOSIS — I5022 Chronic systolic (congestive) heart failure: Secondary | ICD-10-CM | POA: Diagnosis not present

## 2020-01-20 DIAGNOSIS — R1312 Dysphagia, oropharyngeal phase: Secondary | ICD-10-CM | POA: Diagnosis not present

## 2020-01-20 DIAGNOSIS — I130 Hypertensive heart and chronic kidney disease with heart failure and stage 1 through stage 4 chronic kidney disease, or unspecified chronic kidney disease: Secondary | ICD-10-CM | POA: Diagnosis not present

## 2020-01-20 DIAGNOSIS — E43 Unspecified severe protein-calorie malnutrition: Secondary | ICD-10-CM | POA: Diagnosis not present

## 2020-01-20 DIAGNOSIS — I5022 Chronic systolic (congestive) heart failure: Secondary | ICD-10-CM | POA: Diagnosis not present

## 2020-01-20 DIAGNOSIS — N184 Chronic kidney disease, stage 4 (severe): Secondary | ICD-10-CM | POA: Diagnosis not present

## 2020-01-20 DIAGNOSIS — E1151 Type 2 diabetes mellitus with diabetic peripheral angiopathy without gangrene: Secondary | ICD-10-CM | POA: Diagnosis not present

## 2020-01-20 DIAGNOSIS — E1122 Type 2 diabetes mellitus with diabetic chronic kidney disease: Secondary | ICD-10-CM | POA: Diagnosis not present

## 2020-01-20 DIAGNOSIS — Z431 Encounter for attention to gastrostomy: Secondary | ICD-10-CM | POA: Diagnosis not present

## 2020-01-20 DIAGNOSIS — C320 Malignant neoplasm of glottis: Secondary | ICD-10-CM | POA: Diagnosis not present

## 2020-01-21 ENCOUNTER — Encounter (HOSPITAL_COMMUNITY): Payer: Self-pay | Admitting: Emergency Medicine

## 2020-01-21 ENCOUNTER — Emergency Department (HOSPITAL_COMMUNITY)
Admission: EM | Admit: 2020-01-21 | Discharge: 2020-01-21 | Disposition: A | Discharge status: Home / Self Care | Payer: Medicare HMO | Attending: Emergency Medicine | Admitting: Emergency Medicine

## 2020-01-21 ENCOUNTER — Other Ambulatory Visit: Payer: Self-pay

## 2020-01-21 DIAGNOSIS — Z431 Encounter for attention to gastrostomy: Secondary | ICD-10-CM | POA: Insufficient documentation

## 2020-01-21 DIAGNOSIS — E1151 Type 2 diabetes mellitus with diabetic peripheral angiopathy without gangrene: Secondary | ICD-10-CM | POA: Diagnosis not present

## 2020-01-21 DIAGNOSIS — N184 Chronic kidney disease, stage 4 (severe): Secondary | ICD-10-CM | POA: Diagnosis not present

## 2020-01-21 DIAGNOSIS — Z923 Personal history of irradiation: Secondary | ICD-10-CM | POA: Insufficient documentation

## 2020-01-21 DIAGNOSIS — I5022 Chronic systolic (congestive) heart failure: Secondary | ICD-10-CM | POA: Diagnosis not present

## 2020-01-21 DIAGNOSIS — C329 Malignant neoplasm of larynx, unspecified: Secondary | ICD-10-CM | POA: Insufficient documentation

## 2020-01-21 DIAGNOSIS — R1312 Dysphagia, oropharyngeal phase: Secondary | ICD-10-CM | POA: Diagnosis not present

## 2020-01-21 DIAGNOSIS — R58 Hemorrhage, not elsewhere classified: Secondary | ICD-10-CM | POA: Diagnosis not present

## 2020-01-21 DIAGNOSIS — Z4682 Encounter for fitting and adjustment of non-vascular catheter: Secondary | ICD-10-CM | POA: Diagnosis not present

## 2020-01-21 DIAGNOSIS — C320 Malignant neoplasm of glottis: Secondary | ICD-10-CM | POA: Diagnosis not present

## 2020-01-21 DIAGNOSIS — E43 Unspecified severe protein-calorie malnutrition: Secondary | ICD-10-CM | POA: Diagnosis not present

## 2020-01-21 DIAGNOSIS — Z978 Presence of other specified devices: Secondary | ICD-10-CM

## 2020-01-21 DIAGNOSIS — E1122 Type 2 diabetes mellitus with diabetic chronic kidney disease: Secondary | ICD-10-CM | POA: Diagnosis not present

## 2020-01-21 DIAGNOSIS — I130 Hypertensive heart and chronic kidney disease with heart failure and stage 1 through stage 4 chronic kidney disease, or unspecified chronic kidney disease: Secondary | ICD-10-CM | POA: Diagnosis not present

## 2020-01-21 DIAGNOSIS — K9421 Gastrostomy hemorrhage: Secondary | ICD-10-CM | POA: Diagnosis present

## 2020-01-21 NOTE — Progress Notes (Signed)
EPIC Encounter for ICM Monitoring  Patient Name: [name] is a 86 y.o. male Date: 01/21/2020 Primary Care Physican: Axel Filler, MD Primary Cardiologist:Taylor Electrophysiologist: Santina Evans Pacing: 99.9% Last Weight:140lbs  Battery RRT 7 months  Transmission reviewed.  OptivolThoracic impedancenormal.  Prescribed:Furosemide20 mg Place 1 tablet (40 mg total) into feeding tube 2 (two) times daily.  Labs: 12/08/2019 Creatinine 1.85, BUN 46, Potassium 4.9, Sodium 135, GFR 32-37 12/07/2019 Creatinine 1.76, BUN 45, Potassium 5.8, Sodium 136, GFR 34-40 12/06/2019 Creatinine 1.71, BUN 39, Potassium 5.8, Sodium 135, GFR 35-41 A complete set of results can be found in Results Review.  Recommendations:None  Follow-up plan: ICM clinic phone appointment on6/03/2020. 91 day device clinic remote transmission 03/08/2020.   Copy of ICM check sent to Dr.Taylor  3 month ICM trend: 01/19/2020    1 Year ICM trend:       [name], RN 01/21/2020 4:19 PM

## 2020-01-21 NOTE — Discharge Instructions (Signed)
Please schedule follow-up appointment with your primary doctor as well as with the provider that placed the feeding tube for recheck.  If you have any issues with your feeding tube, develop pus drainage, redness, bleeding, abdominal pain or other new symptom, please return to ER.

## 2020-01-21 NOTE — ED Triage Notes (Signed)
Per [facility] pt from home for bleeding around feeding tube and some tissue exposed that he noticed today when he went to feed himself. EMS reports that bleeding contained with them. Vitals: 140/82, 70HR, 100% on RA. CBG 221

## 2020-01-21 NOTE — ED Provider Notes (Signed)
[facility] DEPT Provider Note   CSN: [id number] Arrival date & time: 01/21/20  1455     History Chief Complaint  Patient presents with  . bleeding around feeding tube    [name] is a 86 y.o. male.  Complex past medical historylaryngeal cancers/p radiation finished at the end of February, HTN, HLD, DM2, CHF s/p ICD.  feeding-tube placed back in February.  Patient reports overall he has been doing well as of late.  Has not had any issues with his feeding tube lately, states it is functioning appropriately, no clogged, no redness or drainage from around his G-tube site.  He noted today that there was a small amount of bleeding coming from his G-tube site and wanted it checked out.  The bleeding had stopped prior to arrival in the emergency department.  Patient has no other acute medical complaints.  HPI     Past Medical History:  Diagnosis Date  . Adenomatous colon polyp 02/14/2012  . AICD (automatic cardioverter/defibrillator) present    Medtronic   . BBB (bundle branch block)    right  . Carotid stenosis    a. s/p L carotid stent 2004;  b. Carotid US (09/2014): Bilateral ICA 1-39%, left ECA >59%, normal subclavian bilaterally, occluded left vertebral >> FU 2 years  . Chronic kidney disease   . Chronic systolic CHF (congestive heart failure) (HCC)    a. ischemic CM EF 15-20%;  b. s/p AICD 05/24/04;  c. Echo 7/06: EF 30-40%, mild reduced RVSF, d. Echo 12/2015 EF 35-40%  . Elevated PSA   . History of kidney stones    x 1  . HTN (hypertension)   . Hyperlipidemia   . ICD (implantable cardiac defibrillator) in place 12-25-2012   MDT CRTD upgrade by Dr Lovena Le  . Myocardial infarction ([place]) 1990  . Pericardial effusion    Echocardiogram (09/2014): EF 25% with distal anterior, distal inferior, distal lateral and apical akinesis, grade 1 diastolic dysfunction, very mild aortic stenosis (mean 7 mmHg) - this may be depressed due to low EF (2-D images  suggest mild to moderate aortic stenosis), large pericardial effusion, no RA collapse  . Pneumonia   . Presence of permanent cardiac pacemaker    Medtronic  . PVD (peripheral vascular disease) ([place])    s/p L carotid PTCA/stent 2004  . Transient ischemic attack   . Type II diabetes mellitus ([place])    type II    Patient Active Problem List   Diagnosis Date Noted  . Hyperkalemia 12/05/2019  . Aspiration of food   . Protein-calorie malnutrition, severe 11/22/2019  . Dysphagia 11/21/2019  . Enlarged prostate   . 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 Pharyngeal dysphagia 07/10/2019  . Dysphonia 07/10/2019  . Hypertensive retinopathy of both eyes 02/05/2018  . Mild nonproliferative diabetic retinopathy of both eyes without macular edema associated with type 2 diabetes mellitus ([place]) 02/05/2018  . Dry eye syndrome of both eyes 06/06/2017  . Weight loss 12/11/2016  . Advanced care planning/counseling discussion 12/11/2016  . Urinary retention 01/13/2016  . Healthcare maintenance 11/22/2015  . [name] (chronic kidney disease) stage 3, GFR 30-59 ml/min (HCC) 12/04/2014  . Automatic implantable cardioverter-defibrillator in situ 01/16/2012  . Constipation 03/10/2009  . Coronary atherosclerosis 12/25/2007  . Chronic systolic congestive heart failure ([place]) 12/25/2007  . Hyperlipidemia 01/02/2007  . Type 2 diabetes mellitus with peripheral vascular disease ([place]) 01/01/1989    Past Surgical History:  Procedure Laterality Date  . BI-VENTRICULAR  IMPLANTABLE CARDIOVERTER DEFIBRILLATOR UPGRADE N/A 12/25/2012   Procedure: BI-VENTRICULAR IMPLANTABLE CARDIOVERTER DEFIBRILLATOR UPGRADE;  Surgeon: Evans Lance, MD;  Location: [facility] CATH LAB;  Service: Cardiovascular;  Laterality: N/A;  . BIV ICD UPGRADE  12/25/2012   MDT CRTD upgrade by Dr Lovena Le for ischemic cardiomyopathy and worsening conduction system disease  . CARDIAC CATHETERIZATION  06/2003,  01/2004  . CARDIAC CATHETERIZATION N/A  01/13/2016   Procedure: Left Heart Cath and Coronary Angiography;  Surgeon: Jettie Booze, MD;  Location: [facility] CV LAB;  Service: Cardiovascular;  Laterality: N/A;  . CARDIAC DEFIBRILLATOR PLACEMENT  05/24/2004   Implantation of a MDT single-chamber defibrillator  . CAROTID STENT  09/11/2003   Percutaneous transluminal angioplasty and stent placement of the left internal carotid artery.  Marland Kitchen CATARACT EXTRACTION W/ INTRAOCULAR LENS  IMPLANT, BILATERAL Bilateral ~ 2010  . CORONARY ANGIOPLASTY WITH STENT PLACEMENT  1990   "2" (12/25/2012)  . CYSTOSCOPY    . DIRECT LARYNGOSCOPY N/A 08/15/2019   Procedure: MICRODIRECT LARYNGOSCOPY WITH BIOPSY;  Surgeon: Melida Quitter, MD;  Location: [facility];  Service: ENT;  Laterality: N/A;  . ESOPHAGOGASTRODUODENOSCOPY (EGD) WITH PROPOFOL N/A 11/22/2019   Procedure: ESOPHAGOGASTRODUODENOSCOPY (EGD) WITH PROPOFOL with possible dilation;  Surgeon: Clarene Essex, MD;  Location: [facility];  Service: Endoscopy;  Laterality: N/A;  . INSERT / REPLACE / REMOVE PACEMAKER    . IR GASTROSTOMY TUBE MOD SED  11/27/2019  . LEAD REVISION N/A 12/25/2012   Procedure: LEAD REVISION;  Surgeon: Evans Lance, MD;  Location: [facility] CATH LAB;  Service: Cardiovascular;  Laterality: N/A;  . RIGID ESOPHAGOSCOPY N/A 08/15/2019   Procedure: RIGID ESOPHAGOSCOPY;  Surgeon: Melida Quitter, MD;  Location: [facility] OR;  Service: ENT;  Laterality: N/A;       Family History  Problem Relation Age of Onset  . Diabetes Mother   . Healthy Father   . Diabetes Brother   . Heart attack Neg Hx   . Stroke Neg Hx     Social History   Tobacco Use  . Smoking status: Former Smoker    Types: Cigarettes    Quit date: 12/27/1967    Years since quitting: 52.1  . Smokeless tobacco: Never Used  Substance Use Topics  . Alcohol use: No    Alcohol/week: 0.0 standard drinks    Comment: 12/25/2012 "quit all alcohol 60 yr ago"  . Drug use: No    Home Medications Prior to Admission medications   Medication Sig  Start Date End Date Taking? Authorizing Provider  Amino Acids-Protein Hydrolys (FEEDING SUPPLEMENT, PRO-STAT SUGAR FREE 64,) LIQD Place 30 mLs into feeding tube daily. Patient not taking: Reported on 12/05/2019 11/30/19   Seawell, Andris Baumann A, DO  atorvastatin (LIPITOR) 40 MG tablet TAKE 1 TABLET(40 MG) BY MOUTH DAILY AT 6 PM Patient taking differently: Take 40 mg by mouth daily.  03/03/19   Axel Filler, MD  Blood Glucose Monitoring Suppl (ACCU-CHEK AVIVA PLUS) w/Device KIT Check finger stick glucose once daily 05/06/18   Axel Filler, MD  carvedilol (COREG) 6.25 MG tablet PLACE 1 TABLET INTO FEEDING TUBE TWICE DAILY WITH A MEAL Patient taking differently: Take 6.25 mg by mouth 2 (two) times daily with a meal.  12/02/19   Axel Filler, MD  fluorometholone (FML) 0.1 % ophthalmic suspension Place 2 drops into both eyes 2 (two) times daily.    [provider]  furosemide (LASIX) 40 MG tablet Place 1 tablet (40 mg total) into feeding tube 2 (two) times daily. 12/04/19  Jeanmarie Hubert, MD  glucose blood (ACCU-CHEK AVIVA PLUS) test strip Check blood sugar up to 3 times a day 09/15/19   Axel Filler, MD  Hydrocortisone, Perianal, (PROCTO-PAK) 1 % CREA Apply 1 application topically 2 (two) times daily. Patient not taking: Reported on 12/19/2019 07/25/19   Raylene Everts, MD  insulin glargine (LANTUS) 100 UNIT/ML injection Inject 0.08 mLs (8 Units total) into the skin daily. 12/04/19   Jeanmarie Hubert, MD  Insulin Pen Needle (B-D ULTRAFINE III SHORT PEN) 31G X 8 MM MISC USE ONCE DAILY WITH LANTUS PEN 05/06/18   Axel Filler, MD  lidocaine (XYLOCAINE) 2 % solution Patient: Mix 1part 2% viscous lidocaine, 1part H20. Swallow 28m of diluted mixture, 334m before meals and at bedtime, up to QID Patient not taking: Reported on 12/05/2019 10/27/19   SqEppie GibsonMD  Nutritional Supplements (FEEDING SUPPLEMENT, NEPRO CARB STEADY,) LIQD Place 237 mLs into feeding  tube 4 (four) times daily. 12/08/19   ChMitzi HansenMD  ofloxacin (OCUFLOX) 0.3 % ophthalmic solution Place 1 drop into both eyes 4 (four) times daily.  11/06/19   [provider]  pantoprazole (PROTONIX) 40 MG tablet TAKE 1 TABLET(40 MG) BY MOUTH DAILY 01/19/20   ViAxel FillerMD  polyethylene glycol (MIRALAX / GLYCOLAX) 17 g packet Take 17 g by mouth daily. Patient not taking: Reported on 12/05/2019 07/31/19   MaGeorgette ShellMD  senna-docusate (SENOKOT-S) 8.6-50 MG tablet Place 2 tablets into feeding tube daily. Patient not taking: Reported on 12/05/2019 11/30/19   SeMolli Hazard, DO  tamsulosin (FLOMAX) 0.4 MG CAPS capsule Take 0.8 mg by mouth at bedtime.     [provider]  VENTOLIN HFA 108 (90 Base) MCG/ACT inhaler INHALE 2 PUFFS INTO THE LUNGS EVERY 6 HOURS AS NEEDED FOR WHEEZING OR SHORTNESS OF BREATH Patient taking differently: Inhale 2 puffs into the lungs every 6 (six) hours as needed for wheezing or shortness of breath.  03/15/16   ViAxel FillerMD    Allergies    Patient has no known allergies.  Review of Systems   Review of Systems  Constitutional: Negative for chills and fever.  HENT: Negative for ear pain and sore throat.   Eyes: Negative for pain and visual disturbance.  Respiratory: Negative for cough and shortness of breath.   Cardiovascular: Negative for chest pain and palpitations.  Gastrointestinal: Negative for abdominal pain and vomiting.  Genitourinary: Negative for dysuria and hematuria.  Musculoskeletal: Negative for arthralgias and back pain.  Skin: Negative for color change and rash.  Neurological: Negative for seizures and syncope.  All other systems reviewed and are negative.   Physical Exam Updated Vital Signs BP 125/61 (BP Location: Left Arm)   Pulse 73   Temp 97.8 F (36.6 C) (Oral)   Resp 16   SpO2 100%   Physical Exam Vitals and nursing note reviewed.  Constitutional:      Appearance: He is  well-developed.  HENT:     Head: Normocephalic and atraumatic.  Eyes:     Conjunctiva/sclera: Conjunctivae normal.  Cardiovascular:     Rate and Rhythm: Normal rate and regular rhythm.     Heart sounds: No murmur.  Pulmonary:     Effort: Pulmonary effort is normal. No respiratory distress.     Breath sounds: Normal breath sounds.  Abdominal:     Palpations: Abdomen is soft.     Tenderness: There is no abdominal tenderness.     Comments: G tube site  is clean, dry and intact - no active bleeding noted, skin tissue appears healthy, no erythema, no induration  Musculoskeletal:     Cervical back: Neck supple.  Skin:    General: Skin is warm and dry.  Neurological:     General: No focal deficit present.     Mental Status: He is alert. Mental status is at baseline.     ED Results / Procedures / Treatments   Labs (all labs ordered are listed, but only abnormal results are displayed) Labs Reviewed - No data to display  EKG None  Radiology No results found.  Procedures Procedures (including critical care time)  Medications Ordered in ED Medications - No data to display  ED Course  I have reviewed the triage vital signs and the nursing notes.  Pertinent labs & imaging results that were available during my care of the patient were reviewed by me and considered in my medical decision making (see chart for details).    MDM Rules/Calculators/A&P                      86 year old male presented to ER with concern for bleeding from his G-tube site.  On exam patient is noted be very well-appearing, no other medical complaints or medical concerns.  Vital signs are stable.  On close section of his G-tube site, no signs or symptoms to suggest infection.  Patient reports this is functioning appropriately of his no ongoing issues.  Suspect patient may have had some skin irritation or slightly friable tissue around the G-tube site.  Given current appearance, well-functioning status of his  G-tube, do not feel any emergent imaging or labs needed at this time.  Recommended patient follow-up with his primary doctor for management of this many complex chronic medical conditions and also to recheck on his G tube.     After the discussed management above, the patient was determined to be safe for discharge.  The patient was in agreement with this plan and all questions regarding their care were answered.  ED return precautions were discussed and the patient will return to the ED with any significant worsening of condition.   Final Clinical Impression(s) / ED Diagnoses Final diagnoses:  Uses feeding tube    Rx / DC Orders ED Discharge Orders    None       Lucrezia Starch, MD 01/23/20 1329

## 2020-01-23 DIAGNOSIS — R1312 Dysphagia, oropharyngeal phase: Secondary | ICD-10-CM | POA: Diagnosis not present

## 2020-01-23 DIAGNOSIS — C320 Malignant neoplasm of glottis: Secondary | ICD-10-CM | POA: Diagnosis not present

## 2020-01-23 DIAGNOSIS — I5022 Chronic systolic (congestive) heart failure: Secondary | ICD-10-CM | POA: Diagnosis not present

## 2020-01-23 DIAGNOSIS — N184 Chronic kidney disease, stage 4 (severe): Secondary | ICD-10-CM | POA: Diagnosis not present

## 2020-01-23 DIAGNOSIS — E1151 Type 2 diabetes mellitus with diabetic peripheral angiopathy without gangrene: Secondary | ICD-10-CM | POA: Diagnosis not present

## 2020-01-23 DIAGNOSIS — Z431 Encounter for attention to gastrostomy: Secondary | ICD-10-CM | POA: Diagnosis not present

## 2020-01-23 DIAGNOSIS — I130 Hypertensive heart and chronic kidney disease with heart failure and stage 1 through stage 4 chronic kidney disease, or unspecified chronic kidney disease: Secondary | ICD-10-CM | POA: Diagnosis not present

## 2020-01-23 DIAGNOSIS — E1122 Type 2 diabetes mellitus with diabetic chronic kidney disease: Secondary | ICD-10-CM | POA: Diagnosis not present

## 2020-01-23 DIAGNOSIS — E43 Unspecified severe protein-calorie malnutrition: Secondary | ICD-10-CM | POA: Diagnosis not present

## 2020-01-26 ENCOUNTER — Other Ambulatory Visit: Payer: Self-pay | Admitting: Radiation Oncology

## 2020-01-26 ENCOUNTER — Telehealth: Payer: Self-pay | Admitting: *Deleted

## 2020-01-26 ENCOUNTER — Telehealth: Payer: Self-pay

## 2020-01-26 DIAGNOSIS — C320 Malignant neoplasm of glottis: Secondary | ICD-10-CM | POA: Diagnosis not present

## 2020-01-26 DIAGNOSIS — E43 Unspecified severe protein-calorie malnutrition: Secondary | ICD-10-CM | POA: Diagnosis not present

## 2020-01-26 DIAGNOSIS — E1122 Type 2 diabetes mellitus with diabetic chronic kidney disease: Secondary | ICD-10-CM | POA: Diagnosis not present

## 2020-01-26 DIAGNOSIS — Z431 Encounter for attention to gastrostomy: Secondary | ICD-10-CM | POA: Diagnosis not present

## 2020-01-26 DIAGNOSIS — R1312 Dysphagia, oropharyngeal phase: Secondary | ICD-10-CM | POA: Diagnosis not present

## 2020-01-26 DIAGNOSIS — N184 Chronic kidney disease, stage 4 (severe): Secondary | ICD-10-CM | POA: Diagnosis not present

## 2020-01-26 DIAGNOSIS — E1151 Type 2 diabetes mellitus with diabetic peripheral angiopathy without gangrene: Secondary | ICD-10-CM | POA: Diagnosis not present

## 2020-01-26 DIAGNOSIS — I130 Hypertensive heart and chronic kidney disease with heart failure and stage 1 through stage 4 chronic kidney disease, or unspecified chronic kidney disease: Secondary | ICD-10-CM | POA: Diagnosis not present

## 2020-01-26 DIAGNOSIS — I5022 Chronic systolic (congestive) heart failure: Secondary | ICD-10-CM | POA: Diagnosis not present

## 2020-01-26 NOTE — Telephone Encounter (Signed)
[facility] OT [facility] calls and states pt went w/ son to bank this am and fell as he was getting out of car, cut over brow and bruised nose. States he feels fine and is NOT going to ED. He agrees to come to clinic 5/4 [facility] at 0945. He is cautioned that if he becomes dizzy, vision changes, speech changes, N&V, h/a, becomes lethargic to call 911 or ED asap. He is agreeable.

## 2020-01-26 NOTE — Telephone Encounter (Signed)
Received voicemail from Liliana Cline (Speech Therapist with [facility]) last Friday 4/30, asking when Dr. Isidore Moos would order a swallow test for Mr. Cure to ween patient off his PEG tube. Passed information along to Dr. Isidore Moos who instructed that modified barium swallow order was placed, and that she put in notes of order for results to be sent to [facility].   Returned Menda's call [phone]) and left voicemail with above information. Left my direct call back number in case she had any other questions/concerns.

## 2020-01-26 NOTE — Telephone Encounter (Signed)
I agree with plan.  Thank you.

## 2020-01-27 ENCOUNTER — Other Ambulatory Visit: Payer: Self-pay

## 2020-01-27 ENCOUNTER — Ambulatory Visit (INDEPENDENT_AMBULATORY_CARE_PROVIDER_SITE_OTHER): Payer: Medicare HMO | Admitting: Internal Medicine

## 2020-01-27 DIAGNOSIS — Z9181 History of falling: Secondary | ICD-10-CM | POA: Diagnosis not present

## 2020-01-27 DIAGNOSIS — R1313 Dysphagia, pharyngeal phase: Secondary | ICD-10-CM

## 2020-01-27 NOTE — Assessment & Plan Note (Signed)
Patient is presenting to the clinic for evaluation following a fall on 01/26/2020 [place] the bank. Patient uses cane to ambulate at baseline. He notes that he was getting out of his car and was trying to step onto the sidewalk. He was able to get his cane and right foot onto the side walk but when tried to get his left leg up, he was unable to do so and fell. He denies any prodromal symptoms. He reports hitting his head on the pavement and sustaining a small cut above his right eyebrow but denies any further symptoms. He denies any headaches, dizziness, nausea/vomiting, vision changes, focal weakness or history of frequent falls. On examination, no neurologic deficits noted. He does have a 1-2cm superficial laceration to the right eyebrow that is not actively bleeding. No other lesions noted.  At this time, no further work up needed. However, return precautions provided and if patient continues to have any further episodes, will consider work up.

## 2020-01-27 NOTE — Progress Notes (Signed)
CC: recent fall  HPI:  [name] is a 86 y.o. male with PMHx laryngeal cancers/p radiationtherapy (end 10/2019), HTN, HLD, DM2, CHF s/p ICD presenting for evaluation following recent fall yesterday. Patient denies any acute concerns at this time. Please see problem based charting for further A/P.   Past Medical History:  Diagnosis Date  . Adenomatous colon polyp 02/14/2012  . AICD (automatic cardioverter/defibrillator) present    Medtronic   . BBB (bundle branch block)    right  . Carotid stenosis    a. s/p L carotid stent 2004;  b. Carotid US (09/2014): Bilateral ICA 1-39%, left ECA >59%, normal subclavian bilaterally, occluded left vertebral >> FU 2 years  . Chronic kidney disease   . Chronic systolic CHF (congestive heart failure) (HCC)    a. ischemic CM EF 15-20%;  b. s/p AICD 05/24/04;  c. Echo 7/06: EF 30-40%, mild reduced RVSF, d. Echo 12/2015 EF 35-40%  . Elevated PSA   . History of kidney stones    x 1  . HTN (hypertension)   . Hyperlipidemia   . ICD (implantable cardiac defibrillator) in place 12-25-2012   MDT CRTD upgrade by Dr Lovena Le  . Myocardial infarction ([place]) 1990  . Pericardial effusion    Echocardiogram (09/2014): EF 25% with distal anterior, distal inferior, distal lateral and apical akinesis, grade 1 diastolic dysfunction, very mild aortic stenosis (mean 7 mmHg) - this may be depressed due to low EF (2-D images suggest mild to moderate aortic stenosis), large pericardial effusion, no RA collapse  . Pneumonia   . Presence of permanent cardiac pacemaker    Medtronic  . PVD (peripheral vascular disease) ([place])    s/p L carotid PTCA/stent 2004  . Transient ischemic attack   . Type II diabetes mellitus ([place])    type II   Review of Systems:  Negative except as stated in HPI.   Physical Exam:  Vitals:   01/27/20 1001  BP: (!) 106/58  Pulse: 72  Temp: 98.1 F (36.7 C)  TempSrc: Oral  SpO2: 100%  Weight: 138 lb 6.4 oz (62.8 kg)   Physical  Exam Vitals reviewed.  Constitutional:      General: He is not in acute distress.    Appearance: Normal appearance. He is not diaphoretic.  HENT:     Head: Normocephalic.     Comments: 1-2cm nonbleeding superficial cut above the right eyebrow    Mouth/Throat:     Mouth: Mucous membranes are moist.     Pharynx: Oropharynx is clear.  Eyes:     General: No scleral icterus.    Extraocular Movements: Extraocular movements intact.     Conjunctiva/sclera: Conjunctivae normal.  Cardiovascular:     Rate and Rhythm: Normal rate and regular rhythm.     Pulses: Normal pulses.  Pulmonary:     Effort: Pulmonary effort is normal. No respiratory distress.     Breath sounds: Normal breath sounds. No wheezing, rhonchi or rales.  Abdominal:     General: Abdomen is flat. Bowel sounds are normal. There is no distension.     Tenderness: There is no abdominal tenderness. There is no guarding.     Comments: PEG tube in place with surrounding dressing that appears clean and dry  Musculoskeletal:        General: No tenderness, deformity or signs of injury. Normal range of motion.     Cervical back: Normal range of motion and neck supple. No tenderness.  Skin:    General:  Skin is warm and dry.     Capillary Refill: Capillary refill takes less than 2 seconds.  Neurological:     General: No focal deficit present.     Mental Status: He is alert and oriented to person, place, and time. Mental status is at baseline.     Cranial Nerves: No cranial nerve deficit.     Sensory: No sensory deficit.     Motor: No weakness.      Assessment & Plan:   See Encounters Tab for problem based charting.  Patient discussed with Dr. Lynnae January

## 2020-01-27 NOTE — Assessment & Plan Note (Signed)
Patient has PEG tube in place for feeding given dysphagia. He needs barium swallow eval prior to removal of PEG tube. Per chart review, Dr. Isidore Moos put in orders for the barium swallow evaluation. Will follow up on this and continue to monitor.

## 2020-01-27 NOTE — Patient Instructions (Addendum)
[name],  It was a pleasure seeing you in clinic today. Today we discussed:  Your recent fall: Please continue to use your cain to help with ambulation. Please continue to monitor for any headaches, nausea/vomiting, dizziness, speech changes, or weakness. If you feel any of the above, please seek emergent medical attention.  PEG tube: We are awaiting the barium swallow study at this time. Please contact Dr. Pearlie Oyster office for further information regarding this.    Please contact us if you have any questions or concerns.  Thank you!

## 2020-01-28 DIAGNOSIS — I130 Hypertensive heart and chronic kidney disease with heart failure and stage 1 through stage 4 chronic kidney disease, or unspecified chronic kidney disease: Secondary | ICD-10-CM | POA: Diagnosis not present

## 2020-01-28 DIAGNOSIS — E1151 Type 2 diabetes mellitus with diabetic peripheral angiopathy without gangrene: Secondary | ICD-10-CM | POA: Diagnosis not present

## 2020-01-28 DIAGNOSIS — I5022 Chronic systolic (congestive) heart failure: Secondary | ICD-10-CM | POA: Diagnosis not present

## 2020-01-28 DIAGNOSIS — N184 Chronic kidney disease, stage 4 (severe): Secondary | ICD-10-CM | POA: Diagnosis not present

## 2020-01-28 DIAGNOSIS — E1122 Type 2 diabetes mellitus with diabetic chronic kidney disease: Secondary | ICD-10-CM | POA: Diagnosis not present

## 2020-01-28 DIAGNOSIS — E43 Unspecified severe protein-calorie malnutrition: Secondary | ICD-10-CM | POA: Diagnosis not present

## 2020-01-28 DIAGNOSIS — R1312 Dysphagia, oropharyngeal phase: Secondary | ICD-10-CM | POA: Diagnosis not present

## 2020-01-28 DIAGNOSIS — Z431 Encounter for attention to gastrostomy: Secondary | ICD-10-CM | POA: Diagnosis not present

## 2020-01-28 DIAGNOSIS — C320 Malignant neoplasm of glottis: Secondary | ICD-10-CM | POA: Diagnosis not present

## 2020-01-30 ENCOUNTER — Telehealth: Payer: Self-pay | Admitting: Student in an Organized Health Care Education/Training Program

## 2020-01-30 DIAGNOSIS — I130 Hypertensive heart and chronic kidney disease with heart failure and stage 1 through stage 4 chronic kidney disease, or unspecified chronic kidney disease: Secondary | ICD-10-CM | POA: Diagnosis not present

## 2020-01-30 DIAGNOSIS — E1151 Type 2 diabetes mellitus with diabetic peripheral angiopathy without gangrene: Secondary | ICD-10-CM | POA: Diagnosis not present

## 2020-01-30 DIAGNOSIS — E1122 Type 2 diabetes mellitus with diabetic chronic kidney disease: Secondary | ICD-10-CM | POA: Diagnosis not present

## 2020-01-30 DIAGNOSIS — N184 Chronic kidney disease, stage 4 (severe): Secondary | ICD-10-CM | POA: Diagnosis not present

## 2020-01-30 DIAGNOSIS — I5022 Chronic systolic (congestive) heart failure: Secondary | ICD-10-CM | POA: Diagnosis not present

## 2020-01-30 DIAGNOSIS — R1312 Dysphagia, oropharyngeal phase: Secondary | ICD-10-CM | POA: Diagnosis not present

## 2020-01-30 DIAGNOSIS — E43 Unspecified severe protein-calorie malnutrition: Secondary | ICD-10-CM | POA: Diagnosis not present

## 2020-01-30 DIAGNOSIS — Z431 Encounter for attention to gastrostomy: Secondary | ICD-10-CM | POA: Diagnosis not present

## 2020-01-30 DIAGNOSIS — C320 Malignant neoplasm of glottis: Secondary | ICD-10-CM | POA: Diagnosis not present

## 2020-01-30 NOTE — Telephone Encounter (Signed)
HH SPEECH THERAPY ASK for VO: 1x week for 4 weeks, for continued speech and swallowing Do you agree?

## 2020-01-30 NOTE — Telephone Encounter (Signed)
I agree

## 2020-01-30 NOTE — Telephone Encounter (Signed)
[name] from [facility] need VO [phone]

## 2020-01-30 NOTE — Progress Notes (Signed)
Internal Medicine Clinic Attending  Case discussed with Dr. Aslam at the time of the visit.  We reviewed the resident's history and exam and pertinent patient test results.  I agree with the assessment, diagnosis, and plan of care documented in the resident's note.  

## 2020-02-01 DIAGNOSIS — R1310 Dysphagia, unspecified: Secondary | ICD-10-CM | POA: Diagnosis not present

## 2020-02-01 DIAGNOSIS — E46 Unspecified protein-calorie malnutrition: Secondary | ICD-10-CM | POA: Diagnosis not present

## 2020-02-01 DIAGNOSIS — C320 Malignant neoplasm of glottis: Secondary | ICD-10-CM | POA: Diagnosis not present

## 2020-02-05 ENCOUNTER — Other Ambulatory Visit (HOSPITAL_COMMUNITY): Payer: Self-pay | Admitting: *Deleted

## 2020-02-05 ENCOUNTER — Ambulatory Visit (INDEPENDENT_AMBULATORY_CARE_PROVIDER_SITE_OTHER): Payer: Medicare HMO | Admitting: *Deleted

## 2020-02-05 DIAGNOSIS — I5022 Chronic systolic (congestive) heart failure: Secondary | ICD-10-CM

## 2020-02-05 DIAGNOSIS — I472 Ventricular tachycardia, unspecified: Secondary | ICD-10-CM

## 2020-02-05 DIAGNOSIS — R1312 Dysphagia, oropharyngeal phase: Secondary | ICD-10-CM | POA: Diagnosis not present

## 2020-02-05 DIAGNOSIS — Z431 Encounter for attention to gastrostomy: Secondary | ICD-10-CM | POA: Diagnosis not present

## 2020-02-05 DIAGNOSIS — E43 Unspecified severe protein-calorie malnutrition: Secondary | ICD-10-CM | POA: Diagnosis not present

## 2020-02-05 DIAGNOSIS — E1122 Type 2 diabetes mellitus with diabetic chronic kidney disease: Secondary | ICD-10-CM | POA: Diagnosis not present

## 2020-02-05 DIAGNOSIS — N184 Chronic kidney disease, stage 4 (severe): Secondary | ICD-10-CM | POA: Diagnosis not present

## 2020-02-05 DIAGNOSIS — C320 Malignant neoplasm of glottis: Secondary | ICD-10-CM | POA: Diagnosis not present

## 2020-02-05 DIAGNOSIS — R1310 Dysphagia, unspecified: Secondary | ICD-10-CM

## 2020-02-05 DIAGNOSIS — E1151 Type 2 diabetes mellitus with diabetic peripheral angiopathy without gangrene: Secondary | ICD-10-CM | POA: Diagnosis not present

## 2020-02-05 DIAGNOSIS — I130 Hypertensive heart and chronic kidney disease with heart failure and stage 1 through stage 4 chronic kidney disease, or unspecified chronic kidney disease: Secondary | ICD-10-CM | POA: Diagnosis not present

## 2020-02-05 LAB — CUP PACEART REMOTE DEVICE CHECK
Battery Remaining Longevity: 6 mo
Battery Voltage: 2.83 V
Brady Statistic AP VP Percent: 0.39 %
Brady Statistic AP VS Percent: 0.01 %
Brady Statistic AS VP Percent: 99.44 %
Brady Statistic AS VS Percent: 0.16 %
Brady Statistic RA Percent Paced: 0.39 %
Brady Statistic RV Percent Paced: 99.73 %
Date Time Interrogation Session: 20210513001703
HighPow Impedance: 56 Ohm
Implantable Lead Implant Date: 20140402
Implantable Lead Implant Date: 20140402
Implantable Lead Implant Date: 20140402
Implantable Lead Location: 753858
Implantable Lead Location: 753859
Implantable Lead Location: 753860
Implantable Lead Model: 4194
Implantable Lead Model: 5076
Implantable Lead Model: 6935
Implantable Pulse Generator Implant Date: 20140402
Lead Channel Impedance Value: 228 Ohm
Lead Channel Impedance Value: 399 Ohm
Lead Channel Impedance Value: 399 Ohm
Lead Channel Impedance Value: 418 Ohm
Lead Channel Impedance Value: 475 Ohm
Lead Channel Impedance Value: 513 Ohm
Lead Channel Pacing Threshold Amplitude: 0.75 V
Lead Channel Pacing Threshold Amplitude: 0.75 V
Lead Channel Pacing Threshold Amplitude: 0.875 V
Lead Channel Pacing Threshold Pulse Width: 0.4 ms
Lead Channel Pacing Threshold Pulse Width: 0.4 ms
Lead Channel Pacing Threshold Pulse Width: 0.6 ms
Lead Channel Sensing Intrinsic Amplitude: 14.625 mV
Lead Channel Sensing Intrinsic Amplitude: 14.625 mV
Lead Channel Sensing Intrinsic Amplitude: 2.25 mV
Lead Channel Sensing Intrinsic Amplitude: 2.25 mV
Lead Channel Setting Pacing Amplitude: 1.5 V
Lead Channel Setting Pacing Amplitude: 2 V
Lead Channel Setting Pacing Amplitude: 2.5 V
Lead Channel Setting Pacing Pulse Width: 0.4 ms
Lead Channel Setting Pacing Pulse Width: 0.6 ms
Lead Channel Setting Sensing Sensitivity: 0.3 mV

## 2020-02-06 DIAGNOSIS — E1122 Type 2 diabetes mellitus with diabetic chronic kidney disease: Secondary | ICD-10-CM | POA: Diagnosis not present

## 2020-02-06 DIAGNOSIS — E43 Unspecified severe protein-calorie malnutrition: Secondary | ICD-10-CM | POA: Diagnosis not present

## 2020-02-06 DIAGNOSIS — R1312 Dysphagia, oropharyngeal phase: Secondary | ICD-10-CM | POA: Diagnosis not present

## 2020-02-06 DIAGNOSIS — I130 Hypertensive heart and chronic kidney disease with heart failure and stage 1 through stage 4 chronic kidney disease, or unspecified chronic kidney disease: Secondary | ICD-10-CM | POA: Diagnosis not present

## 2020-02-06 DIAGNOSIS — C320 Malignant neoplasm of glottis: Secondary | ICD-10-CM | POA: Diagnosis not present

## 2020-02-06 DIAGNOSIS — Z431 Encounter for attention to gastrostomy: Secondary | ICD-10-CM | POA: Diagnosis not present

## 2020-02-06 DIAGNOSIS — I5022 Chronic systolic (congestive) heart failure: Secondary | ICD-10-CM | POA: Diagnosis not present

## 2020-02-06 DIAGNOSIS — E1151 Type 2 diabetes mellitus with diabetic peripheral angiopathy without gangrene: Secondary | ICD-10-CM | POA: Diagnosis not present

## 2020-02-06 DIAGNOSIS — N184 Chronic kidney disease, stage 4 (severe): Secondary | ICD-10-CM | POA: Diagnosis not present

## 2020-02-09 DIAGNOSIS — E1151 Type 2 diabetes mellitus with diabetic peripheral angiopathy without gangrene: Secondary | ICD-10-CM | POA: Diagnosis not present

## 2020-02-09 DIAGNOSIS — N184 Chronic kidney disease, stage 4 (severe): Secondary | ICD-10-CM | POA: Diagnosis not present

## 2020-02-09 DIAGNOSIS — R1312 Dysphagia, oropharyngeal phase: Secondary | ICD-10-CM | POA: Diagnosis not present

## 2020-02-09 DIAGNOSIS — E1122 Type 2 diabetes mellitus with diabetic chronic kidney disease: Secondary | ICD-10-CM | POA: Diagnosis not present

## 2020-02-09 DIAGNOSIS — C320 Malignant neoplasm of glottis: Secondary | ICD-10-CM | POA: Diagnosis not present

## 2020-02-09 DIAGNOSIS — I5022 Chronic systolic (congestive) heart failure: Secondary | ICD-10-CM | POA: Diagnosis not present

## 2020-02-09 DIAGNOSIS — E43 Unspecified severe protein-calorie malnutrition: Secondary | ICD-10-CM | POA: Diagnosis not present

## 2020-02-09 DIAGNOSIS — Z431 Encounter for attention to gastrostomy: Secondary | ICD-10-CM | POA: Diagnosis not present

## 2020-02-09 DIAGNOSIS — I130 Hypertensive heart and chronic kidney disease with heart failure and stage 1 through stage 4 chronic kidney disease, or unspecified chronic kidney disease: Secondary | ICD-10-CM | POA: Diagnosis not present

## 2020-02-09 NOTE — Progress Notes (Signed)
Remote ICD transmission.   

## 2020-02-10 ENCOUNTER — Emergency Department (HOSPITAL_COMMUNITY)
Admission: EM | Admit: 2020-02-10 | Discharge: 2020-02-11 | Disposition: A | Discharge status: Home / Self Care | Payer: Medicare HMO | Attending: Emergency Medicine | Admitting: Emergency Medicine

## 2020-02-10 ENCOUNTER — Encounter (HOSPITAL_COMMUNITY): Payer: Self-pay

## 2020-02-10 DIAGNOSIS — I313 Pericardial effusion (noninflammatory): Secondary | ICD-10-CM | POA: Diagnosis not present

## 2020-02-10 DIAGNOSIS — R109 Unspecified abdominal pain: Secondary | ICD-10-CM | POA: Diagnosis not present

## 2020-02-10 DIAGNOSIS — Z79899 Other long term (current) drug therapy: Secondary | ICD-10-CM | POA: Diagnosis not present

## 2020-02-10 DIAGNOSIS — R197 Diarrhea, unspecified: Secondary | ICD-10-CM | POA: Diagnosis not present

## 2020-02-10 DIAGNOSIS — E1122 Type 2 diabetes mellitus with diabetic chronic kidney disease: Secondary | ICD-10-CM | POA: Insufficient documentation

## 2020-02-10 DIAGNOSIS — I509 Heart failure, unspecified: Secondary | ICD-10-CM | POA: Diagnosis not present

## 2020-02-10 DIAGNOSIS — N189 Chronic kidney disease, unspecified: Secondary | ICD-10-CM | POA: Insufficient documentation

## 2020-02-10 DIAGNOSIS — Z87891 Personal history of nicotine dependence: Secondary | ICD-10-CM | POA: Diagnosis not present

## 2020-02-10 DIAGNOSIS — I130 Hypertensive heart and chronic kidney disease with heart failure and stage 1 through stage 4 chronic kidney disease, or unspecified chronic kidney disease: Secondary | ICD-10-CM | POA: Insufficient documentation

## 2020-02-10 DIAGNOSIS — I3139 Other pericardial effusion (noninflammatory): Secondary | ICD-10-CM

## 2020-02-10 DIAGNOSIS — R1111 Vomiting without nausea: Secondary | ICD-10-CM | POA: Diagnosis not present

## 2020-02-10 DIAGNOSIS — Z794 Long term (current) use of insulin: Secondary | ICD-10-CM | POA: Insufficient documentation

## 2020-02-10 DIAGNOSIS — R5381 Other malaise: Secondary | ICD-10-CM | POA: Diagnosis not present

## 2020-02-10 DIAGNOSIS — R112 Nausea with vomiting, unspecified: Secondary | ICD-10-CM | POA: Diagnosis not present

## 2020-02-10 DIAGNOSIS — K8020 Calculus of gallbladder without cholecystitis without obstruction: Secondary | ICD-10-CM | POA: Diagnosis not present

## 2020-02-10 DIAGNOSIS — I10 Essential (primary) hypertension: Secondary | ICD-10-CM | POA: Diagnosis not present

## 2020-02-10 LAB — LIPASE, BLOOD: Lipase: 44 U/L (ref 11–51)

## 2020-02-10 LAB — COMPREHENSIVE METABOLIC PANEL
ALT: 30 U/L (ref 0–44)
AST: 32 U/L (ref 15–41)
Albumin: 4 g/dL (ref 3.5–5.0)
Alkaline Phosphatase: 99 U/L (ref 38–126)
Anion gap: 10 (ref 5–15)
BUN: 101 mg/dL — ABNORMAL HIGH (ref 8–23)
CO2: 31 mmol/L (ref 22–32)
Calcium: 10.2 mg/dL (ref 8.9–10.3)
Chloride: 97 mmol/L — ABNORMAL LOW (ref 98–111)
Creatinine, Ser: 2.19 mg/dL — ABNORMAL HIGH (ref 0.61–1.24)
GFR calc Af Amer: 30 mL/min — ABNORMAL LOW (ref >60)
GFR calc non Af Amer: 26 mL/min — ABNORMAL LOW (ref >60)
Glucose, Bld: 199 mg/dL — ABNORMAL HIGH (ref 70–99)
Potassium: 4.4 mmol/L (ref 3.5–5.1)
Sodium: 138 mmol/L (ref 135–145)
Total Bilirubin: 0.5 mg/dL (ref 0.3–1.2)
Total Protein: 8.5 g/dL — ABNORMAL HIGH (ref 6.5–8.1)

## 2020-02-10 LAB — CBC
HCT: 45.2 % (ref 39.0–52.0)
Hemoglobin: 14 g/dL (ref 13.0–17.0)
MCH: 29.8 pg (ref 26.0–34.0)
MCHC: 31 g/dL (ref 30.0–36.0)
MCV: 96.2 fL (ref 80.0–100.0)
Platelets: 152 10*3/uL (ref 150–400)
RBC: 4.7 MIL/uL (ref 4.22–5.81)
RDW: 14.3 % (ref 11.5–15.5)
WBC: 8.9 10*3/uL (ref 4.0–10.5)
nRBC: 0 % (ref 0.0–0.2)

## 2020-02-10 MED ORDER — SODIUM CHLORIDE 0.9% FLUSH: 3.0000 mL | Freq: Once | INTRAVENOUS | Status: DC

## 2020-02-10 NOTE — ED Triage Notes (Signed)
Pt bib [facility] from home w/ c/o congestion x 1 day. Pt also endorses vomiting episode today. Denies chest pain, sob, diarrhea. Pt denies nausea at this time. EMS VSS.

## 2020-02-10 NOTE — ED Notes (Signed)
Pt's son updated on phone, he would like a call when plan is made. [phone]

## 2020-02-11 ENCOUNTER — Other Ambulatory Visit: Payer: Self-pay

## 2020-02-11 ENCOUNTER — Emergency Department (HOSPITAL_COMMUNITY): Payer: Medicare HMO

## 2020-02-11 ENCOUNTER — Other Ambulatory Visit: Payer: Self-pay | Admitting: Cardiology

## 2020-02-11 DIAGNOSIS — I3139 Other pericardial effusion (noninflammatory): Secondary | ICD-10-CM

## 2020-02-11 DIAGNOSIS — K8020 Calculus of gallbladder without cholecystitis without obstruction: Secondary | ICD-10-CM | POA: Diagnosis not present

## 2020-02-11 DIAGNOSIS — R112 Nausea with vomiting, unspecified: Secondary | ICD-10-CM | POA: Diagnosis not present

## 2020-02-11 LAB — URINALYSIS, ROUTINE W REFLEX MICROSCOPIC
Bilirubin Urine: NEGATIVE
Glucose, UA: NEGATIVE mg/dL
Hgb urine dipstick: NEGATIVE
Ketones, ur: NEGATIVE mg/dL
Leukocytes,Ua: NEGATIVE
Nitrite: NEGATIVE
Protein, ur: NEGATIVE mg/dL
Specific Gravity, Urine: 1.011 (ref 1.005–1.030)
pH: 7 (ref 5.0–8.0)

## 2020-02-11 MED ORDER — SODIUM CHLORIDE 0.9 % IV BOLUS (SEPSIS)
500.0000 mL | Freq: Once | INTRAVENOUS | Status: AC
  Administered 2020-02-11: 500 mL via INTRAVENOUS

## 2020-02-11 MED ORDER — ONDANSETRON HCL 4 MG/2ML IJ SOLN
4.0000 mg | Freq: Once | INTRAMUSCULAR | Status: DC
  Filled 2020-02-11: qty 2

## 2020-02-11 MED ORDER — ONDANSETRON HCL 4 MG PO TABS
4.0000 mg | ORAL_TABLET | Freq: Four times a day (QID) | ORAL | 0 refills | Status: DC
Start: 2020-02-11 — End: 2020-09-22

## 2020-02-11 NOTE — ED Provider Notes (Signed)
TIME SEEN: 2:05 AM  CHIEF COMPLAINT: Nausea, vomiting, diarrhea  HPI: Patient is an 86 year old male with history of chronic kidney disease, CHF status post pacemaker/defibrillator, hypertension, hyperlipidemia, diabetes who presents to the emergency department with nausea, vomiting, diarrhea that started at 2 AM yesterday.  Is also had diffuse abdominal cramping.  No dysuria or hematuria.  No chest pain or shortness of breath.  No sick contacts or recent travel.  Reports he lives at home alone.  Denies previous abdominal surgeries.  ROS: See HPI Constitutional: no fever  Eyes: no drainage  ENT: no runny nose   Cardiovascular:  no chest pain  Resp: no SOB  GI: Vomiting and diarrhea GU: no dysuria Integumentary: no rash  Allergy: no hives  Musculoskeletal: no leg swelling  Neurological: no slurred speech ROS otherwise negative  PAST MEDICAL HISTORY/PAST SURGICAL HISTORY:  Past Medical History:  Diagnosis Date  . Adenomatous colon polyp 02/14/2012  . AICD (automatic cardioverter/defibrillator) present    Medtronic   . BBB (bundle branch block)    right  . Carotid stenosis    a. s/p L carotid stent 2004;  b. Carotid US (09/2014): Bilateral ICA 1-39%, left ECA >59%, normal subclavian bilaterally, occluded left vertebral >> FU 2 years  . Chronic kidney disease   . Chronic systolic CHF (congestive heart failure) (HCC)    a. ischemic CM EF 15-20%;  b. s/p AICD 05/24/04;  c. Echo 7/06: EF 30-40%, mild reduced RVSF, d. Echo 12/2015 EF 35-40%  . Elevated PSA   . History of kidney stones    x 1  . HTN (hypertension)   . Hyperlipidemia   . ICD (implantable cardiac defibrillator) in place 12-25-2012   MDT CRTD upgrade by Dr Lovena Le  . Myocardial infarction ([facility]) 1990  . Pericardial effusion    Echocardiogram (09/2014): EF 25% with distal anterior, distal inferior, distal lateral and apical akinesis, grade 1 diastolic dysfunction, very mild aortic stenosis (mean 7 mmHg) - this may be depressed  due to low EF (2-D images suggest mild to moderate aortic stenosis), large pericardial effusion, no RA collapse  . Pneumonia   . Presence of permanent cardiac pacemaker    Medtronic  . PVD (peripheral vascular disease) ([place])    s/p L carotid PTCA/stent 2004  . Transient ischemic attack   . Type II diabetes mellitus ([place])    type II    MEDICATIONS:  Prior to Admission medications   Medication Sig Start Date End Date Taking? Authorizing Provider  Amino Acids-Protein Hydrolys (FEEDING SUPPLEMENT, PRO-STAT SUGAR FREE 64,) LIQD Place 30 mLs into feeding tube daily. Patient not taking: Reported on 12/05/2019 11/30/19   Seawell, Andris Baumann A, DO  atorvastatin (LIPITOR) 40 MG tablet TAKE 1 TABLET(40 MG) BY MOUTH DAILY AT 6 PM Patient taking differently: Take 40 mg by mouth daily.  03/03/19   Axel Filler, MD  Blood Glucose Monitoring Suppl (ACCU-CHEK AVIVA PLUS) w/Device KIT Check finger stick glucose once daily 05/06/18   Axel Filler, MD  carvedilol (COREG) 6.25 MG tablet PLACE 1 TABLET INTO FEEDING TUBE TWICE DAILY WITH A MEAL Patient taking differently: Take 6.25 mg by mouth 2 (two) times daily with a meal.  12/02/19   Axel Filler, MD  fluorometholone (FML) 0.1 % ophthalmic suspension Place 2 drops into both eyes 2 (two) times daily.    [provider]  furosemide (LASIX) 40 MG tablet Place 1 tablet (40 mg total) into feeding tube 2 (two) times daily. 12/04/19  Jeanmarie Hubert, MD  glucose blood (ACCU-CHEK AVIVA PLUS) test strip Check blood sugar up to 3 times a day 09/15/19   Axel Filler, MD  Hydrocortisone, Perianal, (PROCTO-PAK) 1 % CREA Apply 1 application topically 2 (two) times daily. Patient not taking: Reported on 12/19/2019 07/25/19   Raylene Everts, MD  insulin glargine (LANTUS) 100 UNIT/ML injection Inject 0.08 mLs (8 Units total) into the skin daily. 12/04/19   Jeanmarie Hubert, MD  Insulin Pen Needle (B-D ULTRAFINE III SHORT PEN) 31G X 8  MM MISC USE ONCE DAILY WITH LANTUS PEN 05/06/18   Axel Filler, MD  lidocaine (XYLOCAINE) 2 % solution Patient: Mix 1part 2% viscous lidocaine, 1part H20. Swallow 65m of diluted mixture, 324m before meals and at bedtime, up to QID Patient not taking: Reported on 12/05/2019 10/27/19   SqEppie GibsonMD  Nutritional Supplements (FEEDING SUPPLEMENT, NEPRO CARB STEADY,) LIQD Place 237 mLs into feeding tube 4 (four) times daily. 12/08/19   ChMitzi HansenMD  ofloxacin (OCUFLOX) 0.3 % ophthalmic solution Place 1 drop into both eyes 4 (four) times daily.  11/06/19   [provider]  pantoprazole (PROTONIX) 40 MG tablet TAKE 1 TABLET(40 MG) BY MOUTH DAILY 01/19/20   ViAxel FillerMD  polyethylene glycol (MIRALAX / GLYCOLAX) 17 g packet Take 17 g by mouth daily. Patient not taking: Reported on 12/05/2019 07/31/19   MaGeorgette ShellMD  senna-docusate (SENOKOT-S) 8.6-50 MG tablet Place 2 tablets into feeding tube daily. Patient not taking: Reported on 12/05/2019 11/30/19   SeMolli Hazard, DO  tamsulosin (FLOMAX) 0.4 MG CAPS capsule Take 0.8 mg by mouth at bedtime.     [provider]  VENTOLIN HFA 108 (90 Base) MCG/ACT inhaler INHALE 2 PUFFS INTO THE LUNGS EVERY 6 HOURS AS NEEDED FOR WHEEZING OR SHORTNESS OF BREATH Patient taking differently: Inhale 2 puffs into the lungs every 6 (six) hours as needed for wheezing or shortness of breath.  03/15/16   ViAxel FillerMD    ALLERGIES:  No Known Allergies  SOCIAL HISTORY:  Social History   Tobacco Use  . Smoking status: Former Smoker    Types: Cigarettes    Quit date: 12/27/1967    Years since quitting: 52.1  . Smokeless tobacco: Never Used  Substance Use Topics  . Alcohol use: No    Alcohol/week: 0.0 standard drinks    Comment: 12/25/2012 "quit all alcohol 60 yr ago"    FAMILY HISTORY: Family History  Problem Relation Age of Onset  . Diabetes Mother   . Healthy Father   . Diabetes Brother   . Heart  attack Neg Hx   . Stroke Neg Hx     EXAM: BP 137/80 (BP Location: Right Arm)   Pulse 69   Temp 97.9 F (36.6 C) (Rectal)   Resp 16   SpO2 100%  CONSTITUTIONAL: Alert and oriented and responds appropriately to questions.  Elderly, hard of hearing HEAD: Normocephalic EYES: Conjunctivae clear, pupils appear equal, EOM appear intact ENT: normal nose; moist mucous membranes NECK: Supple, normal ROM CARD: RRR; S1 and S2 appreciated; no murmurs, no clicks, no rubs, no gallops RESP: Normal chest excursion without splinting or tachypnea; breath sounds clear and equal bilaterally; no wheezes, no rhonchi, no rales, no hypoxia or respiratory distress, speaking full sentences ABD/GI: Normal bowel sounds; non-distended; soft, diffusely tender throughout the abdomen, no rebound, no guarding, no peritoneal signs, no hepatosplenomegaly, G-tube in place BACK:  The back appears normal EXT: Normal  ROM in all joints; no deformity noted, no edema; no cyanosis SKIN: Normal color for age and race; warm; no rash on exposed skin NEURO: Moves all extremities equally PSYCH: The patient's mood and manner are appropriate.   MEDICAL DECISION MAKING: Patient here with abdominal pain, vomiting and diarrhea.  Differential includes viral gastroenteritis, colitis, diverticulitis, bowel obstruction, cholecystitis, pancreatitis, appendicitis.  Labs and urine obtained in triage reviewed/interpreted.  He does have mild acute kidney injury superimposed on chronic kidney disease.  Will hydrate gently given his history of CHF.  Otherwise labs unremarkable.  No leukocytosis.  Normal LFTs and lipase.  No UTI.  Will treat symptomatically with IV Zofran.  Will obtain CT of the abdomen pelvis for further evaluation.  ED PROGRESS: CT of abdomen pelvis reviewed/interpreted and shows no acute abdominal pathology.  He has cholelithiasis without cholecystitis, diverticulosis without diverticulitis.  He does have a large pericardial  effusion that has increased from prior CT imaging in November 2020.  Will obtain EKG and discuss with cardiology on-call.  He denies chest pain or shortness of breath and has no hemodynamic instability.  Nothing at this time the suggestive of tamponade.  Suspect outpatient follow-up for this patient.  He states he thinks he has been told he had "fluid around my heart" before.  He is followed by [facility] health cardiology.  We will also interrogate his pacemaker.  3:50 AM  Spoke with Dr. Marletta Lor on call for cardiology.  She agrees that outpatient work-up would be warranted especially given he is asymptomatic, able to lie flat and hemodynamically stable.  She will schedule him for close follow-up for outpatient echocardiogram.  EKG reviewed/interpreted and shows no new change.  Pacemaker interrogated and shows no acute events.  He reports that he is currently feeling better.  Will discharge home with Zofran that he can place in his G-tube.  He does not take anything by mouth.   At this time, I do not feel there is any life-threatening condition present. I have reviewed, interpreted and discussed all results (EKG, imaging, lab, urine as appropriate) and exam findings with patient/family. I have reviewed nursing notes and appropriate previous records.  I feel the patient is safe to be discharged home without further emergent workup and can continue workup as an outpatient as needed. Discussed usual and customary return precautions. Patient/family verbalize understanding and are comfortable with this plan.  Outpatient follow-up has been provided as needed. All questions have been answered.    EKG Interpretation  Date/Time:  Wednesday Feb 11 2020 03:25:52 EDT Ventricular Rate:  69 PR Interval:    QRS Duration: 185 QT Interval:  505 QTC Calculation: 542 R Axis:   -99 Text Interpretation: Sinus rhythm Ventricular premature complex Right bundle branch block No significant change since last tracing Confirmed by  Pryor Curia [phone]) on 02/11/2020 3:53:36 AM         [name] was evaluated in Emergency Department on 02/11/2020 for the symptoms described in the history of present illness. He was evaluated in the context of the global COVID-19 pandemic, which necessitated consideration that the patient might be at risk for infection with the SARS-CoV-2 virus that causes COVID-19. Institutional protocols and algorithms that pertain to the evaluation of patients at risk for COVID-19 are in a state of rapid change based on information released by regulatory bodies including the CDC and federal and state organizations. These policies and algorithms were followed during the patient's care in the ED.  [place] Fassnacht, Delice Bison, DO 02/11/20 [phone]

## 2020-02-11 NOTE — Discharge Instructions (Signed)
Your labs showed mildly elevated kidney function compared to your baseline.  You have received a small amount of IV fluids here.  Otherwise your labs and urine are reassuring today.  CT scan showed no acute abnormality within your abdomen or pelvis but did show a large fluid collection around your heart cardiopericardial fusion.  We have talked to the cardiologist on-call and they recommend that we obtain an echocardiogram which is an ultrasound of your heart as an outpatient.  Please contact your cardiology group for close outpatient follow-up.  If you develop sudden onset chest pain, shortness of breath, dizziness, please return to the emergency department immediately.

## 2020-02-11 NOTE — ED Notes (Signed)
Pt now stating that he was vomiting and had nausea yesterday, but is unsure about whether or not he had an episode of diarrhea

## 2020-02-13 ENCOUNTER — Encounter (HOSPITAL_COMMUNITY): Payer: Self-pay | Admitting: Cardiology

## 2020-02-16 ENCOUNTER — Encounter (HOSPITAL_COMMUNITY): Payer: Self-pay

## 2020-02-16 ENCOUNTER — Other Ambulatory Visit: Payer: Self-pay

## 2020-02-16 ENCOUNTER — Ambulatory Visit (HOSPITAL_COMMUNITY)
Admission: EM | Admit: 2020-02-16 | Discharge: 2020-02-16 | Disposition: A | Discharge status: Home / Self Care | Payer: Medicare HMO | Attending: Family Medicine | Admitting: Family Medicine

## 2020-02-16 DIAGNOSIS — Z931 Gastrostomy status: Secondary | ICD-10-CM | POA: Diagnosis not present

## 2020-02-16 DIAGNOSIS — K6289 Other specified diseases of anus and rectum: Secondary | ICD-10-CM

## 2020-02-16 DIAGNOSIS — K5909 Other constipation: Secondary | ICD-10-CM

## 2020-02-16 MED ORDER — POLYETHYLENE GLYCOL 3350 17 G PO PACK: 17.0000 g | PACK | Freq: Every day | ORAL | 0 refills | Status: DC

## 2020-02-16 MED ORDER — HYDROCORTISONE 1 % EX CREA
TOPICAL_CREAM | CUTANEOUS | 0 refills | Status: DC
Start: 2020-02-16 — End: 2020-09-22

## 2020-02-16 NOTE — Discharge Instructions (Signed)
You may use the dibucaine ointment on your bottom  You may use the miralax mixed with water and place in gastric tube once daily  Follow up with GI or with this office as needed.

## 2020-02-16 NOTE — ED Triage Notes (Signed)
Pt presents with constipation and rectal pain since yesterday.

## 2020-02-16 NOTE — ED Provider Notes (Signed)
[place]   [id number] 02/16/20 Arrival Time: 5277  CC: ABDOMINAL PAIN  SUBJECTIVE:  [name] is a 86 y.o. male who presents with complaint of abdominal discomfort that began gradually 3 days ago.  Denies a precipitating event, trauma, close contacts with similar symptoms, recent travel or antibiotic use. Patient has recently had gastric tube placed x 7 weeks. Localizes pain to rectum today. Reports that he has had a large and hard bowel movement this morning. Denies alleviating or aggravating factors. Denies similar symptoms in the past. Last BM yesterday.    Denies fever, chills, appetite changes, weight changes, nausea, vomiting, chest pain, SOB, diarrhea, hematochezia, melena, dysuria, difficulty urinating, increased frequency or urgency, flank pain, loss of bowel or bladder function.     No LMP for male patient.  ROS: As per HPI.  All other pertinent ROS negative.     Past Medical History:  Diagnosis Date  . Adenomatous colon polyp 02/14/2012  . AICD (automatic cardioverter/defibrillator) present    Medtronic   . BBB (bundle branch block)    right  . Carotid stenosis    a. s/p L carotid stent 2004;  b. Carotid US (09/2014): Bilateral ICA 1-39%, left ECA >59%, normal subclavian bilaterally, occluded left vertebral >> FU 2 years  . Chronic kidney disease   . Chronic systolic CHF (congestive heart failure) (HCC)    a. ischemic CM EF 15-20%;  b. s/p AICD 05/24/04;  c. Echo 7/06: EF 30-40%, mild reduced RVSF, d. Echo 12/2015 EF 35-40%  . Elevated PSA   . History of kidney stones    x 1  . HTN (hypertension)   . Hyperlipidemia   . ICD (implantable cardiac defibrillator) in place 12-25-2012   MDT CRTD upgrade by Dr Lovena Le  . Myocardial infarction ([place]) 1990  . Pericardial effusion    Echocardiogram (09/2014): EF 25% with distal anterior, distal inferior, distal lateral and apical akinesis, grade 1 diastolic dysfunction, very mild aortic stenosis (mean 7 mmHg) - this  may be depressed due to low EF (2-D images suggest mild to moderate aortic stenosis), large pericardial effusion, no RA collapse  . Pneumonia   . Presence of permanent cardiac pacemaker    Medtronic  . PVD (peripheral vascular disease) ([place])    s/p L carotid PTCA/stent 2004  . Transient ischemic attack   . Type II diabetes mellitus ([place])    type II   Past Surgical History:  Procedure Laterality Date  . BI-VENTRICULAR IMPLANTABLE CARDIOVERTER DEFIBRILLATOR UPGRADE N/A 12/25/2012   Procedure: BI-VENTRICULAR IMPLANTABLE CARDIOVERTER DEFIBRILLATOR UPGRADE;  Surgeon: Evans Lance, MD;  Location: [facility] C[facility] LAB;  Service: Cardiovascular;  Laterality: N/A;  . BIV ICD UPGRADE  12/25/2012   MDT CRTD upgrade by Dr Lovena Le for ischemic cardiomyopathy and worsening conduction system disease  . CARDIAC CATHETERIZATION  06/2003,  01/2004  . CARDIAC CATHETERIZATION N/A 01/13/2016   Procedure: Left Heart Cath and Coronary Angiography;  Surgeon: Jettie Booze, MD;  Location: [place] CV LAB;  Service: Cardiovascular;  Laterality: N/A;  . CARDIAC DEFIBRILLATOR PLACEMENT  05/24/2004   Implantation of a MDT single-chamber defibrillator  . CAROTID STENT  09/11/2003   Percutaneous transluminal angioplasty and stent placement of the left internal carotid artery.  Marland Kitchen CATARACT EXTRACTION W/ INTRAOCULAR LENS  IMPLANT, BILATERAL Bilateral ~ 2010  . CORONARY ANGIOPLASTY WITH STENT PLACEMENT  1990   "2" (12/25/2012)  . CYSTOSCOPY    . DIRECT LARYNGOSCOPY N/A 08/15/2019   Procedure: MICRODIRECT LARYNGOSCOPY WITH BIOPSY;  Surgeon: Melida Quitter, MD;  Location: [place];  Service: ENT;  Laterality: N/A;  . ESOPHAGOGASTRODUODENOSCOPY (EGD) WITH PROPOFOL N/A 11/22/2019   Procedure: ESOPHAGOGASTRODUODENOSCOPY (EGD) WITH PROPOFOL with possible dilation;  Surgeon: Clarene Essex, MD;  Location: [place];  Service: Endoscopy;  Laterality: N/A;  . INSERT / REPLACE / REMOVE PACEMAKER    . IR GASTROSTOMY TUBE MOD SED  11/27/2019  .  LEAD REVISION N/A 12/25/2012   Procedure: LEAD REVISION;  Surgeon: Evans Lance, MD;  Location: [facility] CATH LAB;  Service: Cardiovascular;  Laterality: N/A;  . RIGID ESOPHAGOSCOPY N/A 08/15/2019   Procedure: RIGID ESOPHAGOSCOPY;  Surgeon: Melida Quitter, MD;  Location: [facility] OR;  Service: ENT;  Laterality: N/A;   No Known Allergies No current facility-administered medications on file prior to encounter.   Current Outpatient Medications on File Prior to Encounter  Medication Sig Dispense Refill  . Amino Acids-Protein Hydrolys (FEEDING SUPPLEMENT, PRO-STAT SUGAR FREE 64,) LIQD Place 30 mLs into feeding tube daily. (Patient not taking: Reported on 12/05/2019) 887 mL 0  . atorvastatin (LIPITOR) 40 MG tablet TAKE 1 TABLET(40 MG) BY MOUTH DAILY AT 6 PM (Patient taking differently: Take 40 mg by mouth daily. ) 90 tablet 3  . Blood Glucose Monitoring Suppl (ACCU-CHEK AVIVA PLUS) w/Device KIT Check finger stick glucose once daily 1 kit 0  . carvedilol (COREG) 6.25 MG tablet PLACE 1 TABLET INTO FEEDING TUBE TWICE DAILY WITH A MEAL (Patient taking differently: Take 6.25 mg by mouth 2 (two) times daily with a meal. ) 180 tablet 1  . fluorometholone (FML) 0.1 % ophthalmic suspension Place 2 drops into both eyes 2 (two) times daily.    . furosemide (LASIX) 40 MG tablet Place 1 tablet (40 mg total) into feeding tube 2 (two) times daily. 180 tablet 3  . glucose blood (ACCU-CHEK AVIVA PLUS) test strip Check blood sugar up to 3 times a day 300 each 5  . Hydrocortisone, Perianal, (PROCTO-PAK) 1 % CREA Apply 1 application topically 2 (two) times daily. (Patient not taking: Reported on 12/19/2019) 28 g 0  . insulin glargine (LANTUS) 100 UNIT/ML injection Inject 0.08 mLs (8 Units total) into the skin daily. 10 mL 11  . Insulin Pen Needle (B-D ULTRAFINE III SHORT PEN) 31G X 8 MM MISC USE ONCE DAILY WITH LANTUS PEN 100 each 3  . lidocaine (XYLOCAINE) 2 % solution Patient: Mix 1part 2% viscous lidocaine, 1part H20. Swallow 30m of  diluted mixture, 365m before meals and at bedtime, up to QID (Patient not taking: Reported on 12/05/2019) 200 mL 3  . Nutritional Supplements (FEEDING SUPPLEMENT, NEPRO CARB STEADY,) LIQD Place 237 mLs into feeding tube 4 (four) times daily.  0  . ofloxacin (OCUFLOX) 0.3 % ophthalmic solution Place 1 drop into both eyes 4 (four) times daily.     . ondansetron (ZOFRAN) 4 MG tablet Place 1 tablet (4 mg total) into feeding tube every 6 (six) hours. 15 tablet 0  . pantoprazole (PROTONIX) 40 MG tablet TAKE 1 TABLET(40 MG) BY MOUTH DAILY 90 tablet 0  . senna-docusate (SENOKOT-S) 8.6-50 MG tablet Place 2 tablets into feeding tube daily. (Patient not taking: Reported on 12/05/2019) 30 tablet 2  . tamsulosin (FLOMAX) 0.4 MG CAPS capsule Take 0.8 mg by mouth at bedtime.     . VENTOLIN HFA 108 (90 Base) MCG/ACT inhaler INHALE 2 PUFFS INTO THE LUNGS EVERY 6 HOURS AS NEEDED FOR WHEEZING OR SHORTNESS OF BREATH (Patient taking differently: Inhale 2 puffs into the lungs every 6 (six)  hours as needed for wheezing or shortness of breath. ) 18 g 5   Social History   Socioeconomic History  . Marital status: Widowed    Spouse name: Not on file  . Number of children: Not on file  . Years of education: 60  . Highest education level: Not on file  Occupational History  . Occupation: retired  Tobacco Use  . Smoking status: Former Smoker    Types: Cigarettes    Quit date: 12/27/1967    Years since quitting: 52.1  . Smokeless tobacco: Never Used  Substance and Sexual Activity  . Alcohol use: No    Alcohol/week: 0.0 standard drinks    Comment: 12/25/2012 "quit all alcohol 60 yr ago"  . Drug use: No  . Sexual activity: Not on file  Other Topics Concern  . Not on file  Social History Narrative   ** Merged History Encounter **       Single, 2 adult children, daughter in [place], son in [place] Strain:   . Difficulty of Paying Living Expenses:   Food  Insecurity:   . Worried About Charity fundraiser in the Last Year:   . Arboriculturist in the Last Year:   Transportation Needs:   . Film/video editor (Medical):   Marland Kitchen Lack of Transportation (Non-Medical):   Physical Activity:   . Days of Exercise per Week:   . Minutes of Exercise per Session:   Stress:   . Feeling of Stress :   Social Connections:   . Frequency of Communication with Friends and Family:   . Frequency of Social Gatherings with Friends and Family:   . Attends Religious Services:   . Active Member of Clubs or Organizations:   . Attends Archivist Meetings:   Marland Kitchen Marital Status:   Intimate Partner Violence:   . Fear of Current or Ex-Partner:   . Emotionally Abused:   Marland Kitchen Physically Abused:   . Sexually Abused:    Family History  Problem Relation Age of Onset  . Diabetes Mother   . Healthy Father   . Diabetes Brother   . Heart attack Neg Hx   . Stroke Neg Hx      OBJECTIVE:  Vitals:   02/16/20 1146  BP: 129/76  Pulse: 87  Resp: 17  Temp: 98.1 F (36.7 C)  TempSrc: Oral  SpO2: 98%    General appearance: Alert; NAD HEENT: NCAT.  Oropharynx clear.  Lungs: clear to auscultation bilaterally without adventitious breath sounds Heart: regular rate and rhythm.  Radial pulses 2+ symmetrical bilaterally Abdomen: soft, non-distended; normal active bowel sounds; non-tender to light and deep palpation; nontender at McBurney's point; negative Murphy's sign; negative rebound; no guarding Back: no CVA tenderness Extremities: no edema; symmetrical with no gross deformities Skin: warm and dry Neurologic: normal gait Psychological: alert and cooperative; normal mood and affect  LABS: No results found for this or any previous visit (from the past 24 hour(s)).  DIAGNOSTIC STUDIES: No results found.   ASSESSMENT & PLAN:  1. Other constipation   2. Rectal pain   3. Status post insertion of percutaneous endoscopic gastrostomy (PEG) tube ([place])      Meds ordered this encounter  Medications  . hydrocortisone cream 1 %    Sig: Apply to affected area 2 times daily    Dispense:  15 g    Refill:  0    Order Specific  Question:   Supervising Provider    Answer:   Chase Picket [id number]  . polyethylene glycol (MIRALAX / GLYCOLAX) 17 g packet    Sig: Take 17 g by mouth daily.    Dispense:  14 each    Refill:  0    Order Specific Question:   Supervising Provider    Answer:   Chase Picket [id number]    Prescribed hydrocortisone cream to apply to rectum twice daily Mix Miralax with water and give through gastric tube to soften stool to help prevent another episode If you experience new or worsening symptoms return or go to ER such as fever, chills, nausea, vomiting, diarrhea, bloody or dark tarry stools, constipation, urinary symptoms, worsening abdominal discomfort, symptoms that do not improve with medications, inability to keep fluids down.  Reviewed expectations re: course of current medical issues. Questions answered. Outlined signs and symptoms indicating need for more acute intervention. Patient verbalized understanding. After Visit Summary given.    Faustino Congress, NP 02/16/20 1444

## 2020-02-17 ENCOUNTER — Telehealth: Payer: Self-pay | Admitting: Dietician

## 2020-02-17 NOTE — Telephone Encounter (Signed)
Nutrition  Patient scheduled for nutrition follow-up via telephone today. Patient did not answer and was unable to leave message on voicemail. Attempted alternate telephone number listed for patient, however call went directly to an automated voicemail in [place].   Lajuan Lines, RD, LDN Clinical Nutrition After Hours/Weekend Pager # in [place]

## 2020-02-18 ENCOUNTER — Ambulatory Visit (HOSPITAL_COMMUNITY)
Admission: RE | Admit: 2020-02-18 | Discharge: 2020-02-18 | Disposition: A | Discharge status: Home / Self Care | Payer: Medicare HMO | Source: Ambulatory Visit | Attending: Radiation Oncology | Admitting: Radiation Oncology

## 2020-02-18 ENCOUNTER — Other Ambulatory Visit: Payer: Self-pay

## 2020-02-18 ENCOUNTER — Telehealth: Payer: Self-pay | Admitting: Student in an Organized Health Care Education/Training Program

## 2020-02-18 DIAGNOSIS — C320 Malignant neoplasm of glottis: Secondary | ICD-10-CM | POA: Diagnosis not present

## 2020-02-18 DIAGNOSIS — E43 Unspecified severe protein-calorie malnutrition: Secondary | ICD-10-CM | POA: Diagnosis not present

## 2020-02-18 DIAGNOSIS — R1312 Dysphagia, oropharyngeal phase: Secondary | ICD-10-CM | POA: Diagnosis not present

## 2020-02-18 DIAGNOSIS — E1151 Type 2 diabetes mellitus with diabetic peripheral angiopathy without gangrene: Secondary | ICD-10-CM | POA: Diagnosis not present

## 2020-02-18 DIAGNOSIS — R1310 Dysphagia, unspecified: Secondary | ICD-10-CM | POA: Diagnosis not present

## 2020-02-18 DIAGNOSIS — N184 Chronic kidney disease, stage 4 (severe): Secondary | ICD-10-CM | POA: Diagnosis not present

## 2020-02-18 DIAGNOSIS — Z431 Encounter for attention to gastrostomy: Secondary | ICD-10-CM | POA: Diagnosis not present

## 2020-02-18 DIAGNOSIS — T17300A Unspecified foreign body in larynx causing asphyxiation, initial encounter: Secondary | ICD-10-CM | POA: Diagnosis not present

## 2020-02-18 DIAGNOSIS — I5022 Chronic systolic (congestive) heart failure: Secondary | ICD-10-CM | POA: Diagnosis not present

## 2020-02-18 DIAGNOSIS — I130 Hypertensive heart and chronic kidney disease with heart failure and stage 1 through stage 4 chronic kidney disease, or unspecified chronic kidney disease: Secondary | ICD-10-CM | POA: Diagnosis not present

## 2020-02-18 DIAGNOSIS — E1122 Type 2 diabetes mellitus with diabetic chronic kidney disease: Secondary | ICD-10-CM | POA: Diagnosis not present

## 2020-02-18 NOTE — Therapy (Signed)
Modified Barium Swallow Progress Note  Patient Details  Name: [name] MRN: [id number] Date of Birth: 10-22-1930  Today's Date: 02/18/2020  Modified Barium Swallow completed.  Full report located under Chart Review in the Imaging Section.  Brief recommendations include the following:  Clinical Impression  [name] demonstrates an improved oropharyngeal swallow. Orally, pt was edentulous, but reported he could chew without dentures in place. He was slow given ground solids, but functional. Pharyngeally, pt continues with severely reduced constriction and epiglottic inversion, resulting in penetration of thin liquids, nectar thick liquids, and honey thick liquids. Given heavier consistencies, pt has severe vallecular residue, which improves with a dry swallow. He benefits from use of chin tuck and effortful swallow for preventing penetration/aspiration and clearing pharynx.  Esophageal phase was viewed, but largely unremarkable. He had frequent eructations noted, concerning for esophageal or UES dysfunction, but none were visualized.   Recommend: honey thick liquids via cup or spoon, purees with gradual advancement to ground solids, effortful swallow, chin tuck, alternate solids/liquids. Pt/sister thoroughly educated on results and recommendations. Sister demonstrates understanding. Handouts provided. Pt would benefit from follow up outpatient SLP.   Swallow Evaluation Recommendations   Recommended Consults: OP therapy for feeding   SLP Diet Recommendations: Pudding thick liquid;Honey thick liquids   Liquid Administration via: Spoon;Cup   Medication Administration: Crushed with puree   Supervision: Patient able to self feed;Full supervision/cueing for compensatory strategies;Intermittent supervision to cue for compensatory strategies   Compensations: Chin tuck   Postural Changes: Seated upright at 90 degrees   Oral Care Recommendations: Oral care BID      Analyse Angst P.  Torell Minder, M.S., CCC-SLP Speech-Language Pathologist Acute Rehabilitation Services Pager: [place] 02/18/2020,1:12 PM

## 2020-02-18 NOTE — Telephone Encounter (Signed)
I agree, high risk for Covid, should get the vaccine series asap. Should not be any issue with his cancer care.

## 2020-02-18 NOTE — Telephone Encounter (Signed)
Please call Speech Therapist  Alla Feeling from [facility].  [phone]

## 2020-02-18 NOTE — Telephone Encounter (Signed)
minda was ask by pt to calkl and see if it is ok to get the vaccine, informed yes but pt may want to check w/ cancer ctr md's before just to cover everyone Also she ask if triage knew if Mcarthur Rossetti would cover pureed foods since his diet is ordered by the doctor. Informed her to call [employer] and ask for a case manager for assistance. She was agreeable Do you agree dr Evette Doffing?

## 2020-02-24 ENCOUNTER — Other Ambulatory Visit: Payer: Self-pay | Admitting: Radiation Oncology

## 2020-02-24 DIAGNOSIS — R1312 Dysphagia, oropharyngeal phase: Secondary | ICD-10-CM | POA: Diagnosis not present

## 2020-02-24 DIAGNOSIS — N184 Chronic kidney disease, stage 4 (severe): Secondary | ICD-10-CM | POA: Diagnosis not present

## 2020-02-24 DIAGNOSIS — I5022 Chronic systolic (congestive) heart failure: Secondary | ICD-10-CM | POA: Diagnosis not present

## 2020-02-24 DIAGNOSIS — C320 Malignant neoplasm of glottis: Secondary | ICD-10-CM

## 2020-02-24 DIAGNOSIS — Z431 Encounter for attention to gastrostomy: Secondary | ICD-10-CM | POA: Diagnosis not present

## 2020-02-24 DIAGNOSIS — I130 Hypertensive heart and chronic kidney disease with heart failure and stage 1 through stage 4 chronic kidney disease, or unspecified chronic kidney disease: Secondary | ICD-10-CM | POA: Diagnosis not present

## 2020-02-24 DIAGNOSIS — E1122 Type 2 diabetes mellitus with diabetic chronic kidney disease: Secondary | ICD-10-CM | POA: Diagnosis not present

## 2020-02-24 DIAGNOSIS — E43 Unspecified severe protein-calorie malnutrition: Secondary | ICD-10-CM | POA: Diagnosis not present

## 2020-02-24 DIAGNOSIS — E1151 Type 2 diabetes mellitus with diabetic peripheral angiopathy without gangrene: Secondary | ICD-10-CM | POA: Diagnosis not present

## 2020-02-24 NOTE — Therapy (Signed)
[facility] [address] [place], [place], [address] Phone: [phone]   Fax:  [phone]  Patient Details  Name: [name] MRN: [id number] Date of Birth: 1931-01-21 Referring Provider:  Eppie Gibson, MD  Encounter Date: 02/24/2020  SPEECH THERAPY DISCHARGE SUMMARY  Visits from Start of Care: Eval (one visit)  Current functional level related to goals / functional outcomes: Evaluation body included below. Pt was admitted prior to previous follow up ST appointment and was not rescheduled after his admission.   Pt currently tolerates dysphagia I diet with nectar liquids, according to pt. No overt s/sx aspiration PNA observed or reported today by pt. Pt with delay of 4 seconds when asked to swallow his saliva. Pt with slightly weaker than WNL cough. Throat clear was WNL.  POs: Pt ate spoonfulls of applesauce without overt s/s aspiration. SLP did encourage pt to swallow x2 total with each bite however, provided that his MBSS stated severe pharyngeal residue. Pt compliant with this request with subsequent swallows of applesauce.  Thyroid elevation appeared adequate, and initial swallows appeared WFL re: timliness (within 2 seconds of bolus presentation).  Pt's swallow deemed WFL/WNL for dys I food at this time.    Because data states the risk for dysphagia during and after radiation treatment is high due to undergoing radiation tx, SLP taught pt about the possibility of reduced/limited ability for PO intake during rad tx. SLP encouraged pt to continue swallowing POs as far into rad tx as possible, even ingesting POs and/or completing HEP shortly after administration of pain meds. Among other modifications for days when pt cannot functionally swallow, SLP talked about performing only non-swallowing tasks on the handout/HEP, and then adding swallowing tasks back in when it becomes possible to do so.   SLP educated pt re: changes  to swallowing musculature after rad tx, and why adherence to dysphagia HEP provided today and PO consumption was necessary to inhibit or reduce muscle fibrosis following rad tx. Pt demonstrated understanding of these things to SLP.    SLP then developed a HEP for pt and pt was instructed how to perform exercises involving lingual, vocal, and pharyngeal strengthening. SLP performed each exercise and pt return demonstrated each exercise. SLP ensured pt performance was correct prior to moving on to next exercise. Pt was instructed to complete this program 2 times a day, until 6 months after his last rad tx, then x2 a week after that.           SLP Education - 10/30/19 1315     Education Details  HEP procedure, late effect head/neck radiation on swllowing     Person(s) Educated  Patient     Methods  Explanation;Demonstration;Verbal cues;Handout     Comprehension  Verbalized understanding;Returned demonstration;Verbal cues required;Need further instruction              SLP Short Term Goals - 10/30/19 1325              SLP SHORT TERM GOAL #1    Title  pt will demo HEP with occasional min A over two sessions     Time  3     Period  --   sessions, for all STGs    Status  New          SLP SHORT TERM GOAL #2    Title  pt will tell SLP rationale for HEP with rare min A over two sessions     Time  3     Status  New          SLP SHORT TERM GOAL #3    Title  pt will tell SLP 3 overt s/sx of aspiration PNA with modified independence     Time  3     Status  New              SLP Long Term Goals - 10/30/19 1327              SLP LONG TERM GOAL #1    Title  pt will demo HEP with rare min A over two sessions     Time  6     Period  --   sessions, for all LTGS    Status  New          SLP LONG TERM GOAL #2    Title  pt will tell SLP when he can modify HEP frequency, with rare min A     Time  6     Status  New              Plan - 10/30/19 1317     Clinical Impression Statement  Pt  presents today with WNL/WFL swallowing ability with his recomended FOOD (SLP could not assess pt wth nectar due to inability to obtain nectar liquids today).  Pt was without overt s/sx aspiration with pureed items, and he tucked his chin spontaneously. SLP ?s pt strict adherence to dys I diet as he stated '(his) sister and son say, "you cna't eat that, and 'you can't eat that."' No overt s/sx aspiration PNA reported or observed today. Data suggests that as pts progress through rad or chemorad therapy that their swallowing ability will decrease. THEREFORE, MEDICAL STAFF TRETING PT THROUGHOUT HIS RADIATION THERPY SHOULD CLOSELY MONITOR PT FOR OVERT S/SX ASPIRATION PNA AS PT IS AT HIGHER RISK FOR THIS DUE TO CURRENT ORAL AND PHARYNGEAL STAGE DYSPHGIA. After his radiation therapy is completed, pt's ability with swallowing is threatened by muscle fibrosis that will likely develop after rad/chemorad is completed. Therefore, pt was provided an indivdualized exercise program to mitigate/eliminate muscle fibrosis following rad tx. Skilled ST would be beneficial to the pt in order to regularly assess pt's safety with POs and/or need for instrumental swallow assessment, as well as to assess proper completion of HEP.       Remaining deficits: Pt with, assumed, deficits still remaining.   Education / Equipment: HEP procedure, late effects head/neck radiation.   Plan: Patient agrees to discharge.  Patient goals were not met. Patient is being discharged due to a change in medical status.  ?????        [place] ,MS, CCC-SLP  02/24/2020, 1:53 PM  [facility] [address] [place], [place], [address] Phone: [phone]   Fax:  [phone]

## 2020-02-25 NOTE — Progress Notes (Signed)
Oncology Nurse Navigator Documentation   I spoke with Mr. Capistran' sister [name] and confirmed patient's attendance at tomorrow morning's [facility] [place].  She understands to arrive at 1:45 pm for registration, that he will be directed to Radiation Waiting where he will be met prior to the start of [place].  Harlow Asa RN, BSN, OCN Head & Neck Oncology Nurse [place] at [facility] Phone # [phone]  Fax # [phone]

## 2020-02-26 ENCOUNTER — Other Ambulatory Visit: Payer: Self-pay

## 2020-02-26 ENCOUNTER — Ambulatory Visit: Payer: Medicare HMO | Attending: Radiation Oncology

## 2020-02-26 DIAGNOSIS — R1312 Dysphagia, oropharyngeal phase: Secondary | ICD-10-CM | POA: Diagnosis not present

## 2020-02-26 NOTE — Patient Instructions (Signed)
SWALLOWING EXERCISES   1. Effortful Swallows - swallow hard  30 times a day  2. Mendelsohn Maneuver - "half swallow" exercise - swallow hard - hold it for 5 seconds  30 times a day  3. Breath Hold -  "HUH!" loud and hold it  40 times a day  4. "Siren" exercise "EEEEEE"  Start low and go high, then low again   20 times a day

## 2020-02-27 NOTE — Therapy (Addendum)
[facility] [address] [place], [place], [address] Phone: [phone]   Fax:  [phone]  Speech Language Pathology Treatment  Patient Details  Name: [name] MRN: [id number] Date of Birth: 23-Feb-1931 Referring Provider (SLP): Robert Gibson, MD   Encounter Date: 02/26/2020  End of Session - 02/26/20 1615    Visit Number  1    Number of Visits  9    Date for SLP Re-Evaluation  05/26/20    SLP Start Time  4742    SLP Stop Time   5956    SLP Time Calculation (min)  37 min    Activity Tolerance  Patient tolerated treatment well       Past Medical History:  Diagnosis Date  . Adenomatous colon polyp 02/14/2012  . AICD (automatic cardioverter/defibrillator) present    Medtronic   . BBB (bundle branch block)    right  . Carotid stenosis    a. s/p L carotid stent 2004;  b. Carotid US (09/2014): Bilateral ICA 1-39%, left ECA >59%, normal subclavian bilaterally, occluded left vertebral >> FU 2 years  . Chronic kidney disease   . Chronic systolic CHF (congestive heart failure) (HCC)    a. ischemic CM EF 15-20%;  b. s/p AICD 05/24/04;  c. Echo 7/06: EF 30-40%, mild reduced RVSF, d. Echo 12/2015 EF 35-40%  . Elevated PSA   . History of kidney stones    x 1  . HTN (hypertension)   . Hyperlipidemia   . ICD (implantable cardiac defibrillator) in place 12-25-2012   MDT CRTD upgrade by Dr Lovena Le  . Myocardial infarction ([facility]) 1990  . Pericardial effusion    Echocardiogram (09/2014): EF 25% with distal anterior, distal inferior, distal lateral and apical akinesis, grade 1 diastolic dysfunction, very mild aortic stenosis (mean 7 mmHg) - this may be depressed due to low EF (2-D images suggest mild to moderate aortic stenosis), large pericardial effusion, no RA collapse  . Pneumonia   . Presence of permanent cardiac pacemaker    Medtronic  . PVD (peripheral vascular disease) ([place])    s/p L carotid PTCA/stent 2004  .  Transient ischemic attack   . Type II diabetes mellitus ([place])    type II    Past Surgical History:  Procedure Laterality Date  . BI-VENTRICULAR IMPLANTABLE CARDIOVERTER DEFIBRILLATOR UPGRADE N/A 12/25/2012   Procedure: BI-VENTRICULAR IMPLANTABLE CARDIOVERTER DEFIBRILLATOR UPGRADE;  Surgeon: Evans Lance, MD;  Location: [facility] CATH LAB;  Service: Cardiovascular;  Laterality: N/A;  . BIV ICD UPGRADE  12/25/2012   MDT CRTD upgrade by Dr Lovena Le for ischemic cardiomyopathy and worsening conduction system disease  . CARDIAC CATHETERIZATION  06/2003,  01/2004  . CARDIAC CATHETERIZATION N/A 01/13/2016   Procedure: Left Heart Cath and Coronary Angiography;  Surgeon: Jettie Booze, MD;  Location: [facility] CV LAB;  Service: Cardiovascular;  Laterality: N/A;  . CARDIAC DEFIBRILLATOR PLACEMENT  05/24/2004   Implantation of a MDT single-chamber defibrillator  . CAROTID STENT  09/11/2003   Percutaneous transluminal angioplasty and stent placement of the left internal carotid artery.  Marland Kitchen CATARACT EXTRACTION W/ INTRAOCULAR LENS  IMPLANT, BILATERAL Bilateral ~ 2010  . CORONARY ANGIOPLASTY WITH STENT PLACEMENT  1990   "2" (12/25/2012)  . CYSTOSCOPY    . DIRECT LARYNGOSCOPY N/A 08/15/2019   Procedure: MICRODIRECT LARYNGOSCOPY WITH BIOPSY;  Surgeon: Melida Quitter, MD;  Location: [facility];  Service: ENT;  Laterality: N/A;  . ESOPHAGOGASTRODUODENOSCOPY (EGD) WITH PROPOFOL N/A 11/22/2019   Procedure: ESOPHAGOGASTRODUODENOSCOPY (  EGD) WITH PROPOFOL with possible dilation;  Surgeon: Clarene Essex, MD;  Location: [facility];  Service: Endoscopy;  Laterality: N/A;  . INSERT / REPLACE / REMOVE PACEMAKER    . IR GASTROSTOMY TUBE MOD SED  11/27/2019  . LEAD REVISION N/A 12/25/2012   Procedure: LEAD REVISION;  Surgeon: Evans Lance, MD;  Location: [facility] CATH LAB;  Service: Cardiovascular;  Laterality: N/A;  . RIGID ESOPHAGOSCOPY N/A 08/15/2019   Procedure: RIGID ESOPHAGOSCOPY;  Surgeon: Melida Quitter, MD;  Location: [facility] OR;   Service: ENT;  Laterality: N/A;    There were no vitals filed for this visit.  Subjective Assessment - 02/26/20 1418    Subjective  Pt had modified barium swallow 02-18-20 recommending dys I/honey thick liquids with chin tuck, effortful swllow, and alternate between bites and sips. Pt indicated he was drinkng V-8 juice. SLP told him liquids were like honey, not V-8 which is nectar."Thank you, if I have to do it, I will" pt stated.    Currently in Pain?  No/denies        SLP Evaluation [facility] - 02/26/20 1418      SLP Visit Information   SLP Received On  02/26/20    Referring Provider (SLP)  Robert Gibson, MD    Onset Date  August 2020    Medical Diagnosis  Head/neck cancer of glottis      Subjective   Patient/Family Stated Goal  Improve swallowing      General Information   HPI  [name] is a 86 y.o. male who is s/p 20 doses XRT to R larynx/glottis (last treatment on 11/19/19. He has had two prior MBSS, which revealed severe oropharyngeal deficits with recommendaitons for NPO status with use of PEG. Follow up MBSS 3 months s/p radiation on 02/18/20 recommended dys I/honey and precautions (see results under "imaging" for details. Pt was seen by this SLP for one visit in early 2021 but d/c'd due to chnge in medical status (pt was admitted).        Prior Functional Status   Cognitive/Linguistic Baseline  Within functional limits    Type of Home  Apartment    Vocation  Retired      Associate Professor   Overall Cognitive Status  Within Functional Limits for tasks assessed      Auditory Comprehension   Overall Auditory Comprehension  Appears within functional limits for tasks assessed      Verbal Expression   Overall Verbal Expression  Appears within functional limits for tasks assessed      Oral Motor/Sensory Function   Overall Oral Motor/Sensory Function  Impaired    Labial Strength  Reduced   slight   Labial Coordination  Reduced    Lingual Strength  Reduced    Lingual Coordination   Reduced      Motor Speech   Overall Motor Speech  Impaired    Phonation  Hoarse      SLP reviewed pt's modified (MBSS) with him and developed a HEP based on results of the MBSS. See below in "SLP Education" for details re: pt response to education of HEP and precautions/diet and liquid recommendations. Pt demonstrated understanding he would "need to add some of that powder" to his V-8 juice in order to thicken it to honey - consistency.     SLP Education - 02/26/20 1615    Education Details  HEP procedure, swallow precautions, need honey consistency liquids - not nectar, pureed foods,    Person(s) Educated  Patient  Methods  Explanation;Demonstration;Verbal cues;Handout    Comprehension  Verbalized understanding;Returned demonstration;Verbal cues required;Need further instruction       SLP Short Term Goals - 02/26/20 1621      SLP SHORT TERM GOAL #1   Title  pt will demo HEP with occasional min A over two sessions    Time  3    Period  --   sessions, for all STGs   Status  New      SLP SHORT TERM GOAL #2   Title  pt will tell SLP rationale for HEP with rare min A over two sessions    Time  3    Status  New      SLP SHORT TERM GOAL #3   Title  pt will tell SLP 3 overt s/sx of aspiration PNA with modified independence    Time  3    Status  New      SLP SHORT TERM GOAL #4   Title  pt will indicate understanding of diet/liquid recommendation from latest MBSS    Time  3    Status  New       SLP Long Term Goals - 02/26/20 1622      SLP LONG TERM GOAL #1   Title  pt will demo HEP with rare min A over two sessions    Time  6    Period  --   sessions, for all LTGs   Status  New       Plan - 02/26/20 1617    Clinical Impression Statement  Pt with severe pharyngeal dysphagia as ID'd on modified (MBSS) last week. See that report for more details. SLP reviewed precautions, and liquid and diet recommendations with pt. Pt was independent with chin tuck and initial cues  for hard swllow but indepenent at session end. Req'd max cues for HEP procedure but modifed independent when session ended - SLP modifed the wording on handout and pt endorsed beging able to understand modified directions. Pt will cont to be seen for assessing accuracy with HEP procedure as well as adherence to swallow/aspiration precautions and to assess safety with current POs.    Speech Therapy Frequency  --   approx twice/month   Duration  --   8 vistis (9 total)   Treatment/Interventions  Aspiration precaution training;Pharyngeal strengthening exercises;Diet toleration management by SLP;Trials of upgraded texture/liquids;Cueing hierarchy;Internal/external aids;Patient/family education;SLP instruction and feedback;Compensatory techniques    Potential to Achieve Goals  Fair    Potential Considerations  Severity of impairments;Ability to learn/carryover information    SLP Home Exercise Plan  provided today    Consulted and Agree with Plan of Care  Patient       Patient will benefit from skilled therapeutic intervention in order to improve the following deficits and impairments:   Dysphagia, oropharyngeal phase    Problem List Patient Active Problem List   Diagnosis Date Noted  . History of recent fall 01/27/2020  . Hyperkalemia 12/05/2019  . Aspiration of food   . Protein-calorie malnutrition, severe 11/22/2019  . Dysphagia 11/21/2019  . Enlarged prostate   . Malignant neoplasm of glottis ([place]) 07/10/2019  . Pharyngeal dysphagia 07/10/2019  . Dysphonia 07/10/2019  . Hypertensive retinopathy of both eyes 02/05/2018  . Mild nonproliferative diabetic retinopathy of both eyes without macular edema associated with type 2 diabetes mellitus ([place]) 02/05/2018  . Dry eye syndrome of both eyes 06/06/2017  . Weight loss 12/11/2016  . Advanced care planning/counseling discussion 12/11/2016  .  Urinary retention 01/13/2016  . Healthcare maintenance 11/22/2015  . [name] (chronic kidney disease)  stage 3, GFR 30-59 ml/min (HCC) 12/04/2014  . Automatic implantable cardioverter-defibrillator in situ 01/16/2012  . Constipation 03/10/2009  . Coronary atherosclerosis 12/25/2007  . Chronic systolic congestive heart failure ([place]) 12/25/2007  . Hyperlipidemia 01/02/2007  . Type 2 diabetes mellitus with peripheral vascular disease ([place]) 01/01/1989    [place] ,MS, [place]  02/27/2020, 9:12 AM  [facility] [address] [place], [place], [address] Phone: [phone]   Fax:  [phone]   Name: [name] MRN: [id number] Date of Birth: 04-Nov-1930

## 2020-03-01 ENCOUNTER — Ambulatory Visit (INDEPENDENT_AMBULATORY_CARE_PROVIDER_SITE_OTHER): Payer: Medicare HMO

## 2020-03-01 DIAGNOSIS — E1122 Type 2 diabetes mellitus with diabetic chronic kidney disease: Secondary | ICD-10-CM | POA: Diagnosis not present

## 2020-03-01 DIAGNOSIS — I5022 Chronic systolic (congestive) heart failure: Secondary | ICD-10-CM | POA: Diagnosis not present

## 2020-03-01 DIAGNOSIS — Z95810 Presence of automatic (implantable) cardiac defibrillator: Secondary | ICD-10-CM

## 2020-03-01 DIAGNOSIS — E43 Unspecified severe protein-calorie malnutrition: Secondary | ICD-10-CM | POA: Diagnosis not present

## 2020-03-01 DIAGNOSIS — Z431 Encounter for attention to gastrostomy: Secondary | ICD-10-CM | POA: Diagnosis not present

## 2020-03-01 DIAGNOSIS — I130 Hypertensive heart and chronic kidney disease with heart failure and stage 1 through stage 4 chronic kidney disease, or unspecified chronic kidney disease: Secondary | ICD-10-CM | POA: Diagnosis not present

## 2020-03-01 DIAGNOSIS — R1312 Dysphagia, oropharyngeal phase: Secondary | ICD-10-CM | POA: Diagnosis not present

## 2020-03-01 DIAGNOSIS — C320 Malignant neoplasm of glottis: Secondary | ICD-10-CM | POA: Diagnosis not present

## 2020-03-01 DIAGNOSIS — N184 Chronic kidney disease, stage 4 (severe): Secondary | ICD-10-CM | POA: Diagnosis not present

## 2020-03-01 DIAGNOSIS — E1151 Type 2 diabetes mellitus with diabetic peripheral angiopathy without gangrene: Secondary | ICD-10-CM | POA: Diagnosis not present

## 2020-03-02 ENCOUNTER — Telehealth: Payer: Self-pay | Admitting: Nutrition

## 2020-03-02 DIAGNOSIS — C320 Malignant neoplasm of glottis: Secondary | ICD-10-CM | POA: Diagnosis not present

## 2020-03-02 DIAGNOSIS — E1151 Type 2 diabetes mellitus with diabetic peripheral angiopathy without gangrene: Secondary | ICD-10-CM | POA: Diagnosis not present

## 2020-03-02 DIAGNOSIS — Z431 Encounter for attention to gastrostomy: Secondary | ICD-10-CM | POA: Diagnosis not present

## 2020-03-02 DIAGNOSIS — E43 Unspecified severe protein-calorie malnutrition: Secondary | ICD-10-CM | POA: Diagnosis not present

## 2020-03-02 DIAGNOSIS — I130 Hypertensive heart and chronic kidney disease with heart failure and stage 1 through stage 4 chronic kidney disease, or unspecified chronic kidney disease: Secondary | ICD-10-CM | POA: Diagnosis not present

## 2020-03-02 DIAGNOSIS — R1312 Dysphagia, oropharyngeal phase: Secondary | ICD-10-CM | POA: Diagnosis not present

## 2020-03-02 DIAGNOSIS — E46 Unspecified protein-calorie malnutrition: Secondary | ICD-10-CM | POA: Diagnosis not present

## 2020-03-02 DIAGNOSIS — I5022 Chronic systolic (congestive) heart failure: Secondary | ICD-10-CM | POA: Diagnosis not present

## 2020-03-02 DIAGNOSIS — N184 Chronic kidney disease, stage 4 (severe): Secondary | ICD-10-CM | POA: Diagnosis not present

## 2020-03-02 DIAGNOSIS — E1122 Type 2 diabetes mellitus with diabetic chronic kidney disease: Secondary | ICD-10-CM | POA: Diagnosis not present

## 2020-03-02 DIAGNOSIS — R1310 Dysphagia, unspecified: Secondary | ICD-10-CM | POA: Diagnosis not present

## 2020-03-02 NOTE — Telephone Encounter (Signed)
Contacted patient by telephone. He is hard of hearing and was unable to speak with me. His speech therapist from [facility] was there and reports she is teaching him how to puree his foods and thicken his drinks to comply with Dysphagia 1, honey thick liquid diet. Reports he is giving himself 3 feedings daily. Last weight documented was stable at 138.4 pounds and stable. Last labs: Glucose 199, BUN 101, Creatinine 2.19 TF: Nepro, 1 carton 4 times daily with 30 mL Prosource via PEG daily.  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 No new recommendations regarding TF. No follow up scheduled for nutrition at this time as he is stable on TF. He has an appointment to follow up with Dr. Isidore Moos the end of the month. Please consult nutrition if needed.

## 2020-03-02 NOTE — Progress Notes (Addendum)
EPIC Encounter for ICM Monitoring  Patient Name: [name] is a 86 y.o. male Date: 03/02/2020 Primary Care Physican: Axel Filler, MD Primary Cardiologist:Taylor Electrophysiologist: Santina Evans Pacing: 99.8% LastWeight:140lbs  Battery RRT6 months  Transmission reviewed.  OptivolThoracic impedancenormal.  Prescribed:Furosemide20 mgPlace 1 tablet (40 mg total) into feeding tube 2 (two) times daily.  Labs: 02/10/2020 Creatinine 2.19, BUN 101, Potassium 4.4, Sodium 138, GFR 26-30 12/08/2019 Creatinine 1.85, BUN 46,   Potassium 4.9, Sodium 135, GFR 32-37 12/07/2019 Creatinine 1.76, BUN 45,   Potassium 5.8, Sodium 136, GFR 34-40 12/06/2019 Creatinine 1.71, BUN 39,   Potassium 5.8, Sodium 135, GFR 35-41 A complete set of results can be found in Results Review.  Recommendations:None  Follow-up plan: ICM clinic phone appointment on7/16/2021. 91 day device clinic remote transmission7/15/2021.   Copy of ICM check sent to Dr.Taylor  3 month ICM trend: 03/01/2020    1 Year ICM trend:       [name], RN 03/02/2020 1:42 PM

## 2020-03-03 ENCOUNTER — Other Ambulatory Visit: Payer: Self-pay

## 2020-03-03 ENCOUNTER — Ambulatory Visit (HOSPITAL_COMMUNITY): Payer: Medicare HMO | Attending: Cardiology

## 2020-03-03 DIAGNOSIS — I3139 Other pericardial effusion (noninflammatory): Secondary | ICD-10-CM

## 2020-03-03 DIAGNOSIS — I313 Pericardial effusion (noninflammatory): Secondary | ICD-10-CM | POA: Diagnosis not present

## 2020-03-03 MED ORDER — PERFLUTREN LIPID MICROSPHERE
1.0000 mL | INTRAVENOUS | Status: AC | PRN
  Administered 2020-03-03: 1 mL via INTRAVENOUS

## 2020-03-04 ENCOUNTER — Telehealth: Payer: Self-pay

## 2020-03-04 NOTE — Telephone Encounter (Signed)
Ok but if he has increased SOB or any problems to go to ER thanks.

## 2020-03-04 NOTE — Telephone Encounter (Signed)
-----   Message from Isaiah Serge, NP sent at 03/04/2020 11:08 AM EDT ----- Pt was seen in ER and with pericardial effusion on CT of chest , the overnight cards fellow wanted an Echo.  So he in fact does have large pericardial effusion and EF 25-30% slightly decreased from 2017,  please have him see Dr. Lovena Le or APP ASAP -  I requested appt when I placed order for Echo but I do not see an appt.  Pt has been asymptomatic, please check with him again that he has no SOB and he should be seen ASAP.  Thanks Mickel Baas

## 2020-03-04 NOTE — Telephone Encounter (Signed)
Called patient to let him know he needs to come in and see Dr. Lovena Le. Patient stated to call his sister [name] to arrange appointment at [phone]. Called number provided and left message for [facility] to call back.  Patient stated he feels fine at this time.  Patient's sister called back. Made patient an appointment with Dr. Lovena Le on 03/10/20 at 9:30 am on his DOD day, this was the first available appointment. Informed patient's sister if patient has any symptoms to give our office a call or go to the ED.

## 2020-03-08 ENCOUNTER — Ambulatory Visit (INDEPENDENT_AMBULATORY_CARE_PROVIDER_SITE_OTHER): Payer: Medicare HMO | Admitting: *Deleted

## 2020-03-08 DIAGNOSIS — E1122 Type 2 diabetes mellitus with diabetic chronic kidney disease: Secondary | ICD-10-CM | POA: Diagnosis not present

## 2020-03-08 DIAGNOSIS — I472 Ventricular tachycardia, unspecified: Secondary | ICD-10-CM

## 2020-03-08 DIAGNOSIS — I5022 Chronic systolic (congestive) heart failure: Secondary | ICD-10-CM | POA: Diagnosis not present

## 2020-03-08 DIAGNOSIS — Z431 Encounter for attention to gastrostomy: Secondary | ICD-10-CM | POA: Diagnosis not present

## 2020-03-08 DIAGNOSIS — I130 Hypertensive heart and chronic kidney disease with heart failure and stage 1 through stage 4 chronic kidney disease, or unspecified chronic kidney disease: Secondary | ICD-10-CM | POA: Diagnosis not present

## 2020-03-08 DIAGNOSIS — C320 Malignant neoplasm of glottis: Secondary | ICD-10-CM | POA: Diagnosis not present

## 2020-03-08 DIAGNOSIS — N184 Chronic kidney disease, stage 4 (severe): Secondary | ICD-10-CM | POA: Diagnosis not present

## 2020-03-08 DIAGNOSIS — E1151 Type 2 diabetes mellitus with diabetic peripheral angiopathy without gangrene: Secondary | ICD-10-CM | POA: Diagnosis not present

## 2020-03-08 DIAGNOSIS — R1312 Dysphagia, oropharyngeal phase: Secondary | ICD-10-CM | POA: Diagnosis not present

## 2020-03-08 DIAGNOSIS — E43 Unspecified severe protein-calorie malnutrition: Secondary | ICD-10-CM | POA: Diagnosis not present

## 2020-03-08 NOTE — Telephone Encounter (Signed)
Patient is aware of recommendations °

## 2020-03-09 LAB — CUP PACEART REMOTE DEVICE CHECK
Battery Remaining Longevity: 6 mo
Battery Voltage: 2.82 V
Brady Statistic AP VP Percent: 0.16 %
Brady Statistic AP VS Percent: 0.01 %
Brady Statistic AS VP Percent: 99.81 %
Brady Statistic AS VS Percent: 0.02 %
Brady Statistic RA Percent Paced: 0.17 %
Brady Statistic RV Percent Paced: 99.95 %
Date Time Interrogation Session: 20210614082604
HighPow Impedance: 61 Ohm
Implantable Lead Implant Date: 20140402
Implantable Lead Implant Date: 20140402
Implantable Lead Implant Date: 20140402
Implantable Lead Location: 753858
Implantable Lead Location: 753859
Implantable Lead Location: 753860
Implantable Lead Model: 4194
Implantable Lead Model: 5076
Implantable Lead Model: 6935
Implantable Pulse Generator Implant Date: 20140402
Lead Channel Impedance Value: 228 Ohm
Lead Channel Impedance Value: 361 Ohm
Lead Channel Impedance Value: 399 Ohm
Lead Channel Impedance Value: 456 Ohm
Lead Channel Impedance Value: 513 Ohm
Lead Channel Impedance Value: 532 Ohm
Lead Channel Pacing Threshold Amplitude: 0.625 V
Lead Channel Pacing Threshold Amplitude: 0.75 V
Lead Channel Pacing Threshold Amplitude: 0.75 V
Lead Channel Pacing Threshold Pulse Width: 0.4 ms
Lead Channel Pacing Threshold Pulse Width: 0.4 ms
Lead Channel Pacing Threshold Pulse Width: 0.6 ms
Lead Channel Sensing Intrinsic Amplitude: 14.625 mV
Lead Channel Sensing Intrinsic Amplitude: 14.625 mV
Lead Channel Sensing Intrinsic Amplitude: 2 mV
Lead Channel Sensing Intrinsic Amplitude: 2 mV
Lead Channel Setting Pacing Amplitude: 1.25 V
Lead Channel Setting Pacing Amplitude: 2 V
Lead Channel Setting Pacing Amplitude: 2.5 V
Lead Channel Setting Pacing Pulse Width: 0.4 ms
Lead Channel Setting Pacing Pulse Width: 0.6 ms
Lead Channel Setting Sensing Sensitivity: 0.3 mV

## 2020-03-09 NOTE — Progress Notes (Signed)
Remote ICD transmission.   

## 2020-03-10 ENCOUNTER — Other Ambulatory Visit: Payer: Self-pay

## 2020-03-10 ENCOUNTER — Ambulatory Visit (INDEPENDENT_AMBULATORY_CARE_PROVIDER_SITE_OTHER): Payer: Medicare HMO | Admitting: Internal Medicine

## 2020-03-10 VITALS — BP 144/76 | HR 77 | Ht 70.0 in | Wt 137.0 lb

## 2020-03-10 DIAGNOSIS — I472 Ventricular tachycardia, unspecified: Secondary | ICD-10-CM

## 2020-03-10 DIAGNOSIS — I5022 Chronic systolic (congestive) heart failure: Secondary | ICD-10-CM | POA: Diagnosis not present

## 2020-03-10 DIAGNOSIS — Z794 Long term (current) use of insulin: Secondary | ICD-10-CM | POA: Diagnosis not present

## 2020-03-10 DIAGNOSIS — I442 Atrioventricular block, complete: Secondary | ICD-10-CM | POA: Diagnosis not present

## 2020-03-10 DIAGNOSIS — Z95810 Presence of automatic (implantable) cardiac defibrillator: Secondary | ICD-10-CM | POA: Diagnosis not present

## 2020-03-10 DIAGNOSIS — Z931 Gastrostomy status: Secondary | ICD-10-CM | POA: Diagnosis not present

## 2020-03-10 LAB — CUP PACEART INCLINIC DEVICE CHECK
Battery Remaining Longevity: 5 mo
Battery Voltage: 2.81 V
Brady Statistic AP VP Percent: 0.16 %
Brady Statistic AP VS Percent: 0.01 %
Brady Statistic AS VP Percent: 99.68 %
Brady Statistic AS VS Percent: 0.16 %
Brady Statistic RA Percent Paced: 0.17 %
Brady Statistic RV Percent Paced: 99.81 %
Date Time Interrogation Session: 20210616101300
HighPow Impedance: 56 Ohm
Implantable Lead Implant Date: 20140402
Implantable Lead Implant Date: 20140402
Implantable Lead Implant Date: 20140402
Implantable Lead Location: 753858
Implantable Lead Location: 753859
Implantable Lead Location: 753860
Implantable Lead Model: 4194
Implantable Lead Model: 5076
Implantable Lead Model: 6935
Implantable Pulse Generator Implant Date: 20140402
Lead Channel Impedance Value: 228 Ohm
Lead Channel Impedance Value: 361 Ohm
Lead Channel Impedance Value: 399 Ohm
Lead Channel Impedance Value: 399 Ohm
Lead Channel Impedance Value: 475 Ohm
Lead Channel Impedance Value: 532 Ohm
Lead Channel Pacing Threshold Amplitude: 0.75 V
Lead Channel Pacing Threshold Amplitude: 0.75 V
Lead Channel Pacing Threshold Amplitude: 1 V
Lead Channel Pacing Threshold Pulse Width: 0.4 ms
Lead Channel Pacing Threshold Pulse Width: 0.4 ms
Lead Channel Pacing Threshold Pulse Width: 0.6 ms
Lead Channel Sensing Intrinsic Amplitude: 13.5 mV
Lead Channel Sensing Intrinsic Amplitude: 2.9 mV
Lead Channel Setting Pacing Amplitude: 1.25 V
Lead Channel Setting Pacing Amplitude: 2 V
Lead Channel Setting Pacing Amplitude: 2.5 V
Lead Channel Setting Pacing Pulse Width: 0.4 ms
Lead Channel Setting Pacing Pulse Width: 0.6 ms
Lead Channel Setting Sensing Sensitivity: 0.3 mV

## 2020-03-10 NOTE — Progress Notes (Signed)
HPI [name] returns today for followup. He is a pleasant 86 yo man with a h/o an ischemic cardiomyopathy, chronic systolic heart failure, ejection fraction 20%, complete heart block, status post biventricular ICD. In the interim he has been treated for CA of the larynx. He denies chest pain though he has become more sedentary. He has not been vaccinated by Covid. No Known Allergies   Current Outpatient Medications  Medication Sig Dispense Refill  . Amino Acids-Protein Hydrolys (FEEDING SUPPLEMENT, PRO-STAT SUGAR FREE 64,) LIQD Place 30 mLs into feeding tube daily. 887 mL 0  . aspirin EC 81 MG tablet Take 81 mg by mouth daily. Swallow whole.    Marland Kitchen atorvastatin (LIPITOR) 40 MG tablet TAKE 1 TABLET(40 MG) BY MOUTH DAILY AT 6 PM (Patient taking differently: Take 40 mg by mouth daily. ) 90 tablet 3  . Blood Glucose Monitoring Suppl (ACCU-CHEK AVIVA PLUS) w/Device KIT Check finger stick glucose once daily 1 kit 0  . carvedilol (COREG) 6.25 MG tablet PLACE 1 TABLET INTO FEEDING TUBE TWICE DAILY WITH A MEAL (Patient taking differently: Take 6.25 mg by mouth 2 (two) times daily with a meal. ) 180 tablet 1  . fluorometholone (FML) 0.1 % ophthalmic suspension Place 2 drops into both eyes 2 (two) times daily.    . furosemide (LASIX) 40 MG tablet Place 1 tablet (40 mg total) into feeding tube 2 (two) times daily. 180 tablet 3  . glucose blood (ACCU-CHEK AVIVA PLUS) test strip Check blood sugar up to 3 times a day 300 each 5  . hydrocortisone cream 1 % Apply to affected area 2 times daily 15 g 0  . Hydrocortisone, Perianal, (PROCTO-PAK) 1 % CREA Apply 1 application topically 2 (two) times daily. 28 g 0  . insulin glargine (LANTUS) 100 UNIT/ML injection Inject 0.08 mLs (8 Units total) into the skin daily. 10 mL 11  . Insulin Pen Needle (B-D ULTRAFINE III SHORT PEN) 31G X 8 MM MISC USE ONCE DAILY WITH LANTUS PEN 100 each 3  . lidocaine (XYLOCAINE) 2 % solution Patient: Mix 1part 2% viscous lidocaine,  1part H20. Swallow 33m of diluted mixture, 356m before meals and at bedtime, up to QID 200 mL 3  . Nutritional Supplements (FEEDING SUPPLEMENT, NEPRO CARB STEADY,) LIQD Place 237 mLs into feeding tube 4 (four) times daily.  0  . ofloxacin (OCUFLOX) 0.3 % ophthalmic solution Place 1 drop into both eyes 4 (four) times daily.     . ondansetron (ZOFRAN) 4 MG tablet Place 1 tablet (4 mg total) into feeding tube every 6 (six) hours. 15 tablet 0  . pantoprazole (PROTONIX) 40 MG tablet TAKE 1 TABLET(40 MG) BY MOUTH DAILY 90 tablet 0  . polyethylene glycol (MIRALAX / GLYCOLAX) 17 g packet Take 17 g by mouth daily. 14 each 0  . senna-docusate (SENOKOT-S) 8.6-50 MG tablet Place 2 tablets into feeding tube daily. 30 tablet 2  . tamsulosin (FLOMAX) 0.4 MG CAPS capsule Take 0.8 mg by mouth at bedtime.     . VENTOLIN HFA 108 (90 Base) MCG/ACT inhaler INHALE 2 PUFFS INTO THE LUNGS EVERY 6 HOURS AS NEEDED FOR WHEEZING OR SHORTNESS OF BREATH (Patient taking differently: Inhale 2 puffs into the lungs every 6 (six) hours as needed for wheezing or shortness of breath. ) 18 g 5   No current facility-administered medications for this visit.     Past Medical History:  Diagnosis Date  . Adenomatous colon polyp 02/14/2012  . AICD (automatic  cardioverter/defibrillator) present    Medtronic   . BBB (bundle branch block)    right  . Carotid stenosis    a. s/p L carotid stent 2004;  b. Carotid US (09/2014): Bilateral ICA 1-39%, left ECA >59%, normal subclavian bilaterally, occluded left vertebral >> FU 2 years  . Chronic kidney disease   . Chronic systolic CHF (congestive heart failure) (HCC)    a. ischemic CM EF 15-20%;  b. s/p AICD 05/24/04;  c. Echo 7/06: EF 30-40%, mild reduced RVSF, d. Echo 12/2015 EF 35-40%  . Elevated PSA   . History of kidney stones    x 1  . HTN (hypertension)   . Hyperlipidemia   . ICD (implantable cardiac defibrillator) in place 12-25-2012   MDT CRTD upgrade by Dr Lovena Le  . Myocardial  infarction ([place]) 1990  . Pericardial effusion    Echocardiogram (09/2014): EF 25% with distal anterior, distal inferior, distal lateral and apical akinesis, grade 1 diastolic dysfunction, very mild aortic stenosis (mean 7 mmHg) - this may be depressed due to low EF (2-D images suggest mild to moderate aortic stenosis), large pericardial effusion, no RA collapse  . Pneumonia   . Presence of permanent cardiac pacemaker    Medtronic  . PVD (peripheral vascular disease) ([place])    s/p L carotid PTCA/stent 2004  . Transient ischemic attack   . Type II diabetes mellitus ([place])    type II    ROS:   All systems reviewed and negative except as noted in the HPI.   Past Surgical History:  Procedure Laterality Date  . BI-VENTRICULAR IMPLANTABLE CARDIOVERTER DEFIBRILLATOR UPGRADE N/A 12/25/2012   Procedure: BI-VENTRICULAR IMPLANTABLE CARDIOVERTER DEFIBRILLATOR UPGRADE;  Surgeon: Evans Lance, MD;  Location: [facility] CATH LAB;  Service: Cardiovascular;  Laterality: N/A;  . BIV ICD UPGRADE  12/25/2012   MDT CRTD upgrade by Dr Lovena Le for ischemic cardiomyopathy and worsening conduction system disease  . CARDIAC CATHETERIZATION  06/2003,  01/2004  . CARDIAC CATHETERIZATION N/A 01/13/2016   Procedure: Left Heart Cath and Coronary Angiography;  Surgeon: Jettie Booze, MD;  Location: [facility] [facility] CV LAB;  Service: Cardiovascular;  Laterality: N/A;  . CARDIAC DEFIBRILLATOR PLACEMENT  05/24/2004   Implantation of a MDT single-chamber defibrillator  . CAROTID STENT  09/11/2003   Percutaneous transluminal angioplasty and stent placement of the left internal carotid artery.  Marland Kitchen CATARACT EXTRACTION W/ INTRAOCULAR LENS  IMPLANT, BILATERAL Bilateral ~ 2010  . CORONARY ANGIOPLASTY WITH STENT PLACEMENT  1990   "2" (12/25/2012)  . CYSTOSCOPY    . DIRECT LARYNGOSCOPY N/A 08/15/2019   Procedure: MICRODIRECT LARYNGOSCOPY WITH BIOPSY;  Surgeon: Melida Quitter, MD;  Location: [facility];  Service: ENT;  Laterality: N/A;  .  ESOPHAGOGASTRODUODENOSCOPY (EGD) WITH PROPOFOL N/A 11/22/2019   Procedure: ESOPHAGOGASTRODUODENOSCOPY (EGD) WITH PROPOFOL with possible dilation;  Surgeon: Clarene Essex, MD;  Location: [place] [facility];  Service: Endoscopy;  Laterality: N/A;  . INSERT / REPLACE / REMOVE PACEMAKER    . IR GASTROSTOMY TUBE MOD SED  11/27/2019  . LEAD REVISION N/A 12/25/2012   Procedure: LEAD REVISION;  Surgeon: Evans Lance, MD;  Location: [facility] CATH LAB;  Service: Cardiovascular;  Laterality: N/A;  . RIGID ESOPHAGOSCOPY N/A 08/15/2019   Procedure: RIGID ESOPHAGOSCOPY;  Surgeon: Melida Quitter, MD;  Location: [facility] OR;  Service: ENT;  Laterality: N/A;     Family History  Problem Relation Age of Onset  . Diabetes Mother   . Healthy Father   . Diabetes Brother   . Heart  attack Neg Hx   . Stroke Neg Hx      Social History   Socioeconomic History  . Marital status: Widowed    Spouse name: Not on file  . Number of children: Not on file  . Years of education: 58  . Highest education level: Not on file  Occupational History  . Occupation: retired  Tobacco Use  . Smoking status: Former Smoker    Types: Cigarettes    Quit date: 12/27/1967    Years since quitting: 52.2  . Smokeless tobacco: Never Used  Vaping Use  . Vaping Use: Never used  Substance and Sexual Activity  . Alcohol use: No    Alcohol/week: 0.0 standard drinks    Comment: 12/25/2012 "quit all alcohol 60 yr ago"  . Drug use: No  . Sexual activity: Not on file  Other Topics Concern  . Not on file  Social History Narrative   ** Merged History Encounter **       Single, 2 adult children, daughter in [place], son in [place]:   . Difficulty of Paying Living Expenses:   Food Insecurity:   . Worried About Charity fundraiser in the Last Year:   . Arboriculturist in the Last Year:   Transportation Needs:   . Film/video editor (Medical):   Marland Kitchen Lack of Transportation (Non-Medical):     Physical Activity:   . Days of Exercise per Week:   . Minutes of Exercise per Session:   Stress:   . Feeling of Stress :   Social Connections:   . Frequency of Communication with Friends and Family:   . Frequency of Social Gatherings with Friends and Family:   . Attends Religious Services:   . Active Member of Clubs or Organizations:   . Attends Archivist Meetings:   Marland Kitchen Marital Status:   Intimate Partner Violence:   . Fear of Current or Ex-Partner:   . Emotionally Abused:   Marland Kitchen Physically Abused:   . Sexually Abused:      BP (!) 144/76   Pulse 77   Ht _0  (1.778 m)   Wt 137 lb (62.1 kg)   SpO2 98%   BMI 19.66 kg/m   Physical Exam:  Well appearing NAD HEENT: Unremarkable Neck:  No JVD, no thyromegally Lymphatics:  No adenopathy Back:  No CVA tenderness Lungs:  Clear with no wheezes HEART:  Regular rate rhythm, no murmurs, no rubs, no clicks Abd:  soft, positive bowel sounds, no organomegally, no rebound, no guarding Ext:  2 plus pulses, no edema, no cyanosis, no clubbing Skin:  No rashes no nodules Neuro:  CN II through XII intact, motor grossly intact   DEVICE  Normal device function.  See PaceArt for details.   Assess/Plan: 1. Chronic systolic heart failure - he appears euvolemic and his fluid index is normal. He will continue his current meds. 2. ICM - he denies anginal symptoms. No change in meds. 3. VF - he has not had any additional ventricular arrhythmias. 4. ICD - his medtronic biv ICD is working normally.  We will recheck in several months.  [name].D.

## 2020-03-10 NOTE — Patient Instructions (Signed)
Medication Instructions:  Your physician recommends that you continue on your current medications as directed. Please refer to the Current Medication list given to you today.  Labwork: None ordered.  Testing/Procedures: None ordered.  Follow-Up: Your physician wants you to follow-up in: one year with Dr. Lovena Le.   You will receive a reminder letter in the mail two months in advance. If you don't receive a letter, please call our office to schedule the follow-up appointment.  Remote monitoring is used to monitor your ICD from home. This monitoring reduces the number of office visits required to check your device to one time per year. It allows [place] to keep an eye on the functioning of your device to ensure it is working properly. You are scheduled for a device check from home on 04/08/2020. You may send your transmission at any time that day. If you have a wireless device, the transmission will be sent automatically. After your physician reviews your transmission, you will receive a postcard with your next transmission date.  Any Other Special Instructions Will Be Listed Below (If Applicable).  If you need a refill on your cardiac medications before your next appointment, please call your pharmacy.

## 2020-03-15 DIAGNOSIS — I5022 Chronic systolic (congestive) heart failure: Secondary | ICD-10-CM | POA: Diagnosis not present

## 2020-03-15 DIAGNOSIS — N184 Chronic kidney disease, stage 4 (severe): Secondary | ICD-10-CM | POA: Diagnosis not present

## 2020-03-15 DIAGNOSIS — E1122 Type 2 diabetes mellitus with diabetic chronic kidney disease: Secondary | ICD-10-CM | POA: Diagnosis not present

## 2020-03-15 DIAGNOSIS — C320 Malignant neoplasm of glottis: Secondary | ICD-10-CM | POA: Diagnosis not present

## 2020-03-15 DIAGNOSIS — R1312 Dysphagia, oropharyngeal phase: Secondary | ICD-10-CM | POA: Diagnosis not present

## 2020-03-15 DIAGNOSIS — E1151 Type 2 diabetes mellitus with diabetic peripheral angiopathy without gangrene: Secondary | ICD-10-CM | POA: Diagnosis not present

## 2020-03-15 DIAGNOSIS — E43 Unspecified severe protein-calorie malnutrition: Secondary | ICD-10-CM | POA: Diagnosis not present

## 2020-03-15 DIAGNOSIS — I130 Hypertensive heart and chronic kidney disease with heart failure and stage 1 through stage 4 chronic kidney disease, or unspecified chronic kidney disease: Secondary | ICD-10-CM | POA: Diagnosis not present

## 2020-03-15 DIAGNOSIS — Z431 Encounter for attention to gastrostomy: Secondary | ICD-10-CM | POA: Diagnosis not present

## 2020-03-22 DIAGNOSIS — I5022 Chronic systolic (congestive) heart failure: Secondary | ICD-10-CM | POA: Diagnosis not present

## 2020-03-22 DIAGNOSIS — E1151 Type 2 diabetes mellitus with diabetic peripheral angiopathy without gangrene: Secondary | ICD-10-CM | POA: Diagnosis not present

## 2020-03-22 DIAGNOSIS — Z431 Encounter for attention to gastrostomy: Secondary | ICD-10-CM | POA: Diagnosis not present

## 2020-03-22 DIAGNOSIS — N184 Chronic kidney disease, stage 4 (severe): Secondary | ICD-10-CM | POA: Diagnosis not present

## 2020-03-22 DIAGNOSIS — R1312 Dysphagia, oropharyngeal phase: Secondary | ICD-10-CM | POA: Diagnosis not present

## 2020-03-22 DIAGNOSIS — I130 Hypertensive heart and chronic kidney disease with heart failure and stage 1 through stage 4 chronic kidney disease, or unspecified chronic kidney disease: Secondary | ICD-10-CM | POA: Diagnosis not present

## 2020-03-22 DIAGNOSIS — C320 Malignant neoplasm of glottis: Secondary | ICD-10-CM | POA: Diagnosis not present

## 2020-03-22 DIAGNOSIS — E1122 Type 2 diabetes mellitus with diabetic chronic kidney disease: Secondary | ICD-10-CM | POA: Diagnosis not present

## 2020-03-22 DIAGNOSIS — E43 Unspecified severe protein-calorie malnutrition: Secondary | ICD-10-CM | POA: Diagnosis not present

## 2020-03-23 ENCOUNTER — Ambulatory Visit
Admission: RE | Admit: 2020-03-23 | Discharge: 2020-03-23 | Disposition: A | Discharge status: Home / Self Care | Payer: Medicare HMO | Source: Ambulatory Visit | Attending: Radiation Oncology | Admitting: Radiation Oncology

## 2020-03-23 ENCOUNTER — Other Ambulatory Visit: Payer: Self-pay

## 2020-03-23 VITALS — BP 107/56 | HR 80 | Temp 97.5°F | Resp 20 | Ht 70.0 in | Wt 130.0 lb

## 2020-03-23 DIAGNOSIS — Z8521 Personal history of malignant neoplasm of larynx: Secondary | ICD-10-CM | POA: Diagnosis not present

## 2020-03-23 DIAGNOSIS — Z08 Encounter for follow-up examination after completed treatment for malignant neoplasm: Secondary | ICD-10-CM | POA: Diagnosis not present

## 2020-03-23 DIAGNOSIS — C320 Malignant neoplasm of glottis: Secondary | ICD-10-CM

## 2020-03-23 MED ORDER — LARYNGOSCOPY SOLUTION RAD-ONC
15.0000 mL | Freq: Once | TOPICAL | Status: AC
  Administered 2020-03-23: 15 mL via TOPICAL
  Filled 2020-03-23: qty 15

## 2020-03-23 NOTE — Progress Notes (Signed)
[name] presents today for follow-up after completing radiation to his glottis on 11/19/2019  Pain issues, if any: Patient denies Using a feeding tube?: Yes--patient reports 4 times daily. He is trying to slowly eat more pureed foods as well (about 2 meals daily) Weight changes, if any:  Wt Readings from Last 3 Encounters:  03/23/20 130 lb (59 kg)  03/10/20 137 lb (62.1 kg)  01/27/20 138 lb 6.4 oz (62.8 kg)   Swallowing issues, if any: Patient states he is doing well with puree textured foods. Had a modified barium swallow study on 02/18/2020 and then F/U with Bridgett Larsson on 02/26/2020: "Pt had modified barium swallow 02-18-20 recommending dys I/honey thick liquids with chin tuck, effortful swllow, and alternate between bites and sips. SLP reviewed precautions, and liquid and diet recommendations with pt. Pt was independent with chin tuck and initial cues for hard swllow but indepenent at session end. Req'd max cues for HEP procedure but modifed independent when session ended - SLP modifed the wording on handout and pt endorsed beging able to understand modified directions. Pt will cont to be seen for assessing accuracy with HEP procedure as well as adherence to swallow/aspiration precautions and to assess safety with current POs." Smoking or chewing tobacco? None Using fluoride trays daily? N/A Last ENT visit was on: Not since he saw Dr. Melida Quitter when hospitalized back in March of this year (patient thinks he has a ride set up via [place] transportation to see Dr. Redmond Baseman this Thursday 03/25/20) Other notable issues, if any: Nothing of note. Patient feels he is doing well and has no complaints.  Vitals:   03/23/20 1426 03/23/20 1427  BP: 98/85 (!) 107/56  Pulse: 80 80  Resp: 20   Temp: (!) 97.5 F (36.4 C)   SpO2: 100%

## 2020-03-24 ENCOUNTER — Encounter: Payer: Self-pay | Admitting: Radiation Oncology

## 2020-03-24 NOTE — Progress Notes (Signed)
Radiation Oncology         [phone] ________________________________  Name: [name] MRN: [id number]  Date: 03/23/2020  DOB: 1931-04-24  Follow-Up Visit Note in person  CC: Axel Filler, MD  Axel Filler,*  Diagnosis and Prior Radiotherapy:       ICD-10-CM   1. Malignant neoplasm of glottis ([place])  C32.0 laryngocopy solution for Rad-Onc    Fiberoptic laryngoscopy    CHIEF COMPLAINT:  Here for follow-up and surveillance of glottic cancer  Narrative:    [name] presents today for follow-up after completing radiation to his glottis on 11/19/2019  Pain issues, if any: Patient denies Using a feeding tube?: Yes--patient reports 4 times daily. He is trying to slowly eat more pureed foods as well (about 2 meals daily) Weight changes, if any:  Wt Readings from Last 3 Encounters:  03/23/20 130 lb (59 kg)  03/10/20 137 lb (62.1 kg)  01/27/20 138 lb 6.4 oz (62.8 kg)   Swallowing issues, if any: Patient states he is doing well with puree textured foods. Had a modified barium swallow study on 02/18/2020 and then F/U with Bridgett Larsson on 02/26/2020: "Pt had modified barium swallow 02-18-20 recommending dys I/honey thick liquids with chin tuck, effortful swllow, and alternate between bites and sips. SLP reviewed precautions, and liquid and diet recommendations with pt. Pt was independent with chin tuck and initial cues for hard swllow but indepenent at session end. Req'd max cues for HEP procedure but modifed independent when session ended - SLP modifed the wording on handout and pt endorsed beging able to understand modified directions. Pt will cont to be seen for assessing accuracy with HEP procedure as well as adherence to swallow/aspiration precautions and to assess safety with current POs." Smoking or chewing tobacco? None Using fluoride trays daily? N/A Last ENT visit was on: Not since he saw Dr. Melida Quitter when hospitalized back in March of this year  (patient thinks he has a ride set up via [place] transportation to see Dr. Redmond Baseman this Thursday 03/25/20) Other notable issues, if any: Nothing of note. Patient feels he is doing well and has no complaints.   Patient has the following card, and believes that it will alter his insurance eligibility for follow-up appointments.  Patient navigator informed:         ALLERGIES:  has No Known Allergies.  Meds: Current Outpatient Medications  Medication Sig Dispense Refill  . Amino Acids-Protein Hydrolys (FEEDING SUPPLEMENT, PRO-STAT SUGAR FREE 64,) LIQD Place 30 mLs into feeding tube daily. 887 mL 0  . aspirin EC 81 MG tablet Take 81 mg by mouth daily. Swallow whole.    Marland Kitchen atorvastatin (LIPITOR) 40 MG tablet TAKE 1 TABLET(40 MG) BY MOUTH DAILY AT 6 PM (Patient taking differently: Take 40 mg by mouth daily. ) 90 tablet 3  . Blood Glucose Monitoring Suppl (ACCU-CHEK AVIVA PLUS) w/Device KIT Check finger stick glucose once daily 1 kit 0  . carvedilol (COREG) 6.25 MG tablet PLACE 1 TABLET INTO FEEDING TUBE TWICE DAILY WITH A MEAL (Patient taking differently: Take 6.25 mg by mouth 2 (two) times daily with a meal. ) 180 tablet 1  . fluorometholone (FML) 0.1 % ophthalmic suspension Place 2 drops into both eyes 2 (two) times daily.    . furosemide (LASIX) 40 MG tablet Place 1 tablet (40 mg total) into feeding tube 2 (two) times daily. 180 tablet 3  . glucose blood (ACCU-CHEK AVIVA PLUS) test strip Check blood sugar up to  3 times a day 300 each 5  . hydrocortisone cream 1 % Apply to affected area 2 times daily 15 g 0  . Hydrocortisone, Perianal, (PROCTO-PAK) 1 % CREA Apply 1 application topically 2 (two) times daily. 28 g 0  . insulin glargine (LANTUS) 100 UNIT/ML injection Inject 0.08 mLs (8 Units total) into the skin daily. 10 mL 11  . Insulin Pen Needle (B-D ULTRAFINE III SHORT PEN) 31G X 8 MM MISC USE ONCE DAILY WITH LANTUS PEN 100 each 3  . lidocaine (XYLOCAINE) 2 % solution Patient: Mix 1part 2% viscous  lidocaine, 1part H20. Swallow 28m of diluted mixture, 359m before meals and at bedtime, up to QID 200 mL 3  . Nutritional Supplements (FEEDING SUPPLEMENT, NEPRO CARB STEADY,) LIQD Place 237 mLs into feeding tube 4 (four) times daily.  0  . ofloxacin (OCUFLOX) 0.3 % ophthalmic solution Place 1 drop into both eyes 4 (four) times daily.     . ondansetron (ZOFRAN) 4 MG tablet Place 1 tablet (4 mg total) into feeding tube every 6 (six) hours. 15 tablet 0  . pantoprazole (PROTONIX) 40 MG tablet TAKE 1 TABLET(40 MG) BY MOUTH DAILY 90 tablet 0  . polyethylene glycol (MIRALAX / GLYCOLAX) 17 g packet Take 17 g by mouth daily. 14 each 0  . senna-docusate (SENOKOT-S) 8.6-50 MG tablet Place 2 tablets into feeding tube daily. 30 tablet 2  . tamsulosin (FLOMAX) 0.4 MG CAPS capsule Take 0.8 mg by mouth at bedtime.     . VENTOLIN HFA 108 (90 Base) MCG/ACT inhaler INHALE 2 PUFFS INTO THE LUNGS EVERY 6 HOURS AS NEEDED FOR WHEEZING OR SHORTNESS OF BREATH (Patient taking differently: Inhale 2 puffs into the lungs every 6 (six) hours as needed for wheezing or shortness of breath. ) 18 g 5   No current facility-administered medications for this encounter.    Physical Findings: The patient is in no acute distress. Patient is alert and oriented. Wt Readings from Last 3 Encounters:  03/23/20 130 lb (59 kg)  03/10/20 137 lb (62.1 kg)  01/27/20 138 lb 6.4 oz (62.8 kg)    height is '5\' 10"'$  (1.778 m) and weight is 130 lb (59 kg). His temperature is 97.5 F (36.4 C) (abnormal). His blood pressure is 107/56 (abnormal) and his pulse is 80. His respiration is 20 and oxygen saturation is 100%. .  General: Alert and oriented, in no acute distress HEENT: Oral cavity and oropharynx is notable for no oral thrush, no tumors; he is edentulous Neck: Neck is notable for no palpable adenopathy Skin: Skin in treatment fields shows satisfactory healing with residual pigmentation changes from radiotherapy Chest clear to auscultation  bilaterally Heart regular in rate and rhythm Abdomen: PEG site intact without any sign of cellulitis  PROCEDURE NOTE: After obtaining consent and anesthetizing the nasal cavity with topical lidocaine and phenylephrine, the flexible endoscope was introduced and passed through the nasal cavity.  No sign of tumor in the nasopharynx, pharynx, or larynx.  The vocal cords move symmetrically.  Copious secretions.   Lab Findings: Lab Results  Component Value Date   WBC 8.9 02/10/2020   HGB 14.0 02/10/2020   HCT 45.2 02/10/2020   MCV 96.2 02/10/2020   PLT 152 02/10/2020    Lab Results  Component Value Date   TSH 0.986 05/21/2019    Radiographic Findings: ECHOCARDIOGRAM COMPLETE  Result Date: 03/03/2020    ECHOCARDIOGRAM REPORT   Patient Name:   MARENARDO CHEATUMate of Exam: 03/03/2020 Medical Rec #:  [id number]        Height:       68.0 in Accession #:    [id number]       Weight:       138.4 lb Date of Birth:  02/20/1931        BSA:          1.748 m Patient Age:    7 years         BP:           106/58 mmHg Patient Gender: M                HR:           71 bpm. Exam Location:  [facility] Procedure: 2D Echo, Cardiac Doppler, Color Doppler and Intracardiac            Opacification Agent Indications:    I31.3 Pericardial effusion.  History:        Patient has prior history of Echocardiogram examinations, most                 recent 01/09/2016. CHF, Defibrillator; Risk Factors:Diabetes,                 Dyslipidemia and Former Smoker. [name] stage 3.  Sonographer:    Jessee Avers, RDCS Referring Phys: [place] Comments: Technically difficult study due to poor echo windows. IMPRESSIONS  1. Left ventricular ejection fraction, by estimation, is 25 to 30%. The left ventricle has severely decreased function. The left ventricle demonstrates regional wall motion abnormalities (see scoring diagram/findings for description). Left ventricular diastolic parameters are consistent with Grade I  diastolic dysfunction (impaired relaxation).  2. Right ventricular systolic function is normal. The right ventricular size is normal.  3. IVC collapses. . Large pericardial effusion. The pericardial effusion is circumferential. There is no evidence of cardiac tamponade.  4. The mitral valve is normal in structure. No evidence of mitral valve regurgitation. No evidence of mitral stenosis.  5. The aortic valve is tricuspid. Aortic valve regurgitation is not visualized. Mild aortic valve stenosis. Aortic valve area, by VTI measures 1.48 cm. Aortic valve mean gradient measures 13.0 mmHg. Aortic valve Vmax measures 2.27 m/s.  6. The inferior vena cava is normal in size with greater than 50% respiratory variability, suggesting right atrial pressure of 3 mmHg. Comparison(s): 01/09/16 EF 30-35. Trivial pericardial effusion. When compared to prior, pericardial effusion is now large. FINDINGS  Left Ventricle: Left ventricular ejection fraction, by estimation, is 25 to 30%. The left ventricle has severely decreased function. The left ventricle demonstrates regional wall motion abnormalities. Definity contrast agent was given IV to delineate the left ventricular endocardial borders. The left ventricular internal cavity size was normal in size. There is no left ventricular hypertrophy. Left ventricular diastolic parameters are consistent with Grade I diastolic dysfunction (impaired relaxation).  LV Wall Scoring: The mid and distal anterior wall, entire apex, mid and distal inferior wall, mid anterolateral segment, and mid inferoseptal segment are akinetic. Right Ventricle: The right ventricular size is normal. No increase in right ventricular wall thickness. Right ventricular systolic function is normal. Left Atrium: Left atrial size was normal in size. Right Atrium: Right atrial size was normal in size. Pericardium: IVC collapses. A large pericardial effusion is present. The pericardial effusion is circumferential. There is no  evidence of cardiac tamponade. Mitral Valve: The mitral valve is normal in structure. Normal mobility of the mitral valve leaflets. No evidence of mitral valve  regurgitation. No evidence of mitral valve stenosis. Tricuspid Valve: The tricuspid valve is normal in structure. Tricuspid valve regurgitation is mild . No evidence of tricuspid stenosis. Aortic Valve: The aortic valve is tricuspid. . There is moderate thickening and moderate calcification of the aortic valve. Aortic valve regurgitation is not visualized. Mild aortic stenosis is present. There is moderate thickening of the aortic valve. There is moderate calcification of the aortic valve. Aortic valve mean gradient measures 13.0 mmHg. Aortic valve peak gradient measures 20.7 mmHg. Aortic valve area, by VTI measures 1.48 cm. Pulmonic Valve: The pulmonic valve was normal in structure. Pulmonic valve regurgitation is not visualized. No evidence of pulmonic stenosis. Aorta: The aortic root is normal in size and structure. Venous: The inferior vena cava is normal in size with greater than 50% respiratory variability, suggesting right atrial pressure of 3 mmHg. IAS/Shunts: No atrial level shunt detected by color flow Doppler.  LEFT VENTRICLE PLAX 2D LVIDd:         6.20 cm  Diastology LVIDs:         4.80 cm  LV e' lateral:   3.71 cm/s LV PW:         0.50 cm  LV E/e' lateral: 19.5 LV IVS:        0.80 cm  LV e' medial:    3.43 cm/s LVOT diam:     2.20 cm  LV E/e' medial:  21.1 LV SV:         92 LV SV Index:   53 LVOT Area:     3.80 cm  RIGHT VENTRICLE RV Basal diam:  3.00 cm RV S prime:     6.81 cm/s TAPSE (M-mode): 1.7 cm LEFT ATRIUM             Index       RIGHT ATRIUM           Index LA diam:        3.60 cm 2.06 cm/m  RA Pressure: 3.00 mmHg LA Vol (A2C):   65.7 ml 37.59 ml/m RA Area:     13.60 cm LA Vol (A4C):   39.0 ml 22.29 ml/m RA Volume:   30.40 ml  17.39 ml/m LA Biplane Vol: 59.5 ml 34.04 ml/m  AORTIC VALVE AV Area (Vmax):    1.63 cm AV Area (Vmean):    1.50 cm AV Area (VTI):     1.48 cm AV Vmax:           227.25 cm/s AV Vmean:          170.500 cm/s AV VTI:            0.620 m AV Peak Grad:      20.7 mmHg AV Mean Grad:      13.0 mmHg LVOT Vmax:         97.50 cm/s LVOT Vmean:        67.200 cm/s LVOT VTI:          0.242 m LVOT/AV VTI ratio: 0.39  AORTA Ao Root diam: 3.10 cm Ao Asc diam:  3.60 cm MITRAL VALVE                TRICUSPID VALVE                             Estimated RAP:  3.00 mmHg  MV E velocity: 72.30 cm/s   SHUNTS MV A velocity: 126.00 cm/s  Systemic VTI:  0.24  m MV E/A ratio:  0.57         Systemic Diam: 2.20 cm Candee Furbish MD Electronically signed by Candee Furbish MD Signature Date/Time: 03/03/2020/5:32:55 PM    Final    CUP PACEART INCLINIC DEVICE CHECK  Result Date: 03/10/2020 Pacemaker check in clinic. Normal device function. Thresholds, sensing, impedances consistent with previous measurements. Device programmed to maximize longevity. No mode switch or high ventricular rates noted. Device programmed at appropriate safety margins. Histogram distribution appropriate for patient activity level. Estimated longevity 5 months. Patient enrolled in monthly battery checks remote follow-up 04/08/20. Patient education completed. ROV w/ GT in 1 year.Lavenia Atlas, BSN, RN  CUP PACEART REMOTE DEVICE CHECK  Result Date: 03/09/2020 Scheduled remote reviewed. Normal device function.  Battery estimated 5 months to ERI, wireless transmitting. Next remote 91 days. [facility]ose   Impression/Plan:    1) Head and Neck Cancer Status: Healing from radiotherapy with no evidence of disease  2) Nutritional Status: He is losing some weight and I will refer him back to nutrition - he is tolerating a soft diet  PEG tube: Using.  He wants to have this removed and explained that he needs to gain some weight and stabilize his weight on a completely oral diet for [place] to do this.  3) Risk Factors: The patient has been educated about risk factors including alcohol and  tobacco abuse; they understand that avoidance of alcohol and tobacco is important to prevent recurrences as well as other cancers.  He reports abstaining from smoking.  4) Swallowing: Continue swallowing therapy  5)  Thyroid function: He denies signs or symptoms of hypothyroidism.  I will check his TSH at his next appointment Lab Results  Component Value Date   TSH 0.986 05/21/2019    6)  Follow-up in 6 months. The patient was encouraged to call with any issues or questions before then.  Harlow Asa, our Head and Neck Oncology Navigator, is ensuring that the patient has follow-up with otolaryngology within the next 3 months.      On date of service, in total, I spent 25 minutes on this encounter.  He was seen in person.  _________________________   Eppie Gibson, MD

## 2020-03-25 ENCOUNTER — Other Ambulatory Visit: Payer: Self-pay

## 2020-03-25 ENCOUNTER — Ambulatory Visit: Payer: Medicare HMO

## 2020-03-25 DIAGNOSIS — C320 Malignant neoplasm of glottis: Secondary | ICD-10-CM

## 2020-04-06 ENCOUNTER — Encounter: Payer: Self-pay | Admitting: *Deleted

## 2020-04-08 ENCOUNTER — Ambulatory Visit (INDEPENDENT_AMBULATORY_CARE_PROVIDER_SITE_OTHER): Payer: Medicare HMO | Admitting: *Deleted

## 2020-04-08 DIAGNOSIS — I472 Ventricular tachycardia, unspecified: Secondary | ICD-10-CM

## 2020-04-08 LAB — CUP PACEART REMOTE DEVICE CHECK
Battery Remaining Longevity: 6 mo
Battery Voltage: 2.82 V
Brady Statistic AP VP Percent: 2.94 %
Brady Statistic AP VS Percent: 0.01 %
Brady Statistic AS VP Percent: 97.03 %
Brady Statistic AS VS Percent: 0.03 %
Brady Statistic RA Percent Paced: 2.94 %
Brady Statistic RV Percent Paced: 99.89 %
Date Time Interrogation Session: 20210715012305
HighPow Impedance: 58 Ohm
Implantable Lead Implant Date: 20140402
Implantable Lead Implant Date: 20140402
Implantable Lead Implant Date: 20140402
Implantable Lead Location: 753858
Implantable Lead Location: 753859
Implantable Lead Location: 753860
Implantable Lead Model: 4194
Implantable Lead Model: 5076
Implantable Lead Model: 6935
Implantable Pulse Generator Implant Date: 20140402
Lead Channel Impedance Value: 228 Ohm
Lead Channel Impedance Value: 361 Ohm
Lead Channel Impedance Value: 399 Ohm
Lead Channel Impedance Value: 418 Ohm
Lead Channel Impedance Value: 475 Ohm
Lead Channel Impedance Value: 551 Ohm
Lead Channel Pacing Threshold Amplitude: 0.75 V
Lead Channel Pacing Threshold Amplitude: 0.75 V
Lead Channel Pacing Threshold Amplitude: 0.875 V
Lead Channel Pacing Threshold Pulse Width: 0.4 ms
Lead Channel Pacing Threshold Pulse Width: 0.4 ms
Lead Channel Pacing Threshold Pulse Width: 0.6 ms
Lead Channel Sensing Intrinsic Amplitude: 13.5 mV
Lead Channel Sensing Intrinsic Amplitude: 14.625 mV
Lead Channel Sensing Intrinsic Amplitude: 2.625 mV
Lead Channel Sensing Intrinsic Amplitude: 2.625 mV
Lead Channel Setting Pacing Amplitude: 1.5 V
Lead Channel Setting Pacing Amplitude: 2 V
Lead Channel Setting Pacing Amplitude: 2.5 V
Lead Channel Setting Pacing Pulse Width: 0.4 ms
Lead Channel Setting Pacing Pulse Width: 0.6 ms
Lead Channel Setting Sensing Sensitivity: 0.3 mV

## 2020-04-09 ENCOUNTER — Ambulatory Visit (INDEPENDENT_AMBULATORY_CARE_PROVIDER_SITE_OTHER): Payer: Medicare (Managed Care)

## 2020-04-09 DIAGNOSIS — I5022 Chronic systolic (congestive) heart failure: Secondary | ICD-10-CM

## 2020-04-09 DIAGNOSIS — Z95810 Presence of automatic (implantable) cardiac defibrillator: Secondary | ICD-10-CM

## 2020-04-09 NOTE — Progress Notes (Signed)
Remote ICD transmission.   

## 2020-04-12 NOTE — Progress Notes (Signed)
EPIC Encounter for ICM Monitoring  Patient Name: [name] is a 86 y.o. male Date: 04/12/2020 Primary Care Physican: Axel Filler, MD Primary Cardiologist:Taylor Electrophysiologist: Santina Evans Pacing: 99.9% LastWeight:140lbs  Battery RRT5 months  Transmission reviewed.  OptivolThoracic impedancenormal.  Prescribed:Furosemide20 mgPlace 1 tablet (40 mg total) into feeding tube 2 (two) times daily.  Labs: 02/10/2020 Creatinine 2.19, BUN 101, Potassium 4.4, Sodium 138, GFR 26-30 12/08/2019 Creatinine 1.85, BUN 46,   Potassium 4.9, Sodium 135, GFR 32-37 12/07/2019 Creatinine 1.76, BUN 45,   Potassium 5.8, Sodium 136, GFR 34-40 12/06/2019 Creatinine 1.71, BUN 39,   Potassium 5.8, Sodium 135, GFR 35-41 A complete set of results can be found in Results Review.  Recommendations:None  Follow-up plan: ICM clinic phone appointment on8/16/2021. 91 day device clinic remote transmission9/16/2021.   EP/Cardiology Office Visits: 03/06/2021 with Dr. Lovena Le.    Copy of ICM check sent to Dr. Lovena Le.   3 month ICM trend: 04/08/2020    1 Year ICM trend:       [name], RN 04/12/2020 12:49 PM

## 2020-05-03 ENCOUNTER — Encounter: Payer: Medicare HMO | Admitting: Student in an Organized Health Care Education/Training Program

## 2020-05-10 ENCOUNTER — Ambulatory Visit (INDEPENDENT_AMBULATORY_CARE_PROVIDER_SITE_OTHER): Payer: Medicare (Managed Care)

## 2020-05-10 ENCOUNTER — Ambulatory Visit (INDEPENDENT_AMBULATORY_CARE_PROVIDER_SITE_OTHER): Payer: Medicare HMO | Admitting: *Deleted

## 2020-05-10 DIAGNOSIS — Z95810 Presence of automatic (implantable) cardiac defibrillator: Secondary | ICD-10-CM | POA: Diagnosis not present

## 2020-05-10 DIAGNOSIS — I5022 Chronic systolic (congestive) heart failure: Secondary | ICD-10-CM

## 2020-05-10 DIAGNOSIS — I472 Ventricular tachycardia, unspecified: Secondary | ICD-10-CM

## 2020-05-10 LAB — CUP PACEART REMOTE DEVICE CHECK
Battery Remaining Longevity: 4 mo
Battery Voltage: 2.8 V
Brady Statistic AP VP Percent: 8.43 %
Brady Statistic AP VS Percent: 0.01 %
Brady Statistic AS VP Percent: 91.56 %
Brady Statistic AS VS Percent: 0.01 %
Brady Statistic RA Percent Paced: 8.43 %
Brady Statistic RV Percent Paced: 99.95 %
Date Time Interrogation Session: 20210816002205
HighPow Impedance: 59 Ohm
Implantable Lead Implant Date: 20140402
Implantable Lead Implant Date: 20140402
Implantable Lead Implant Date: 20140402
Implantable Lead Location: 753858
Implantable Lead Location: 753859
Implantable Lead Location: 753860
Implantable Lead Model: 4194
Implantable Lead Model: 5076
Implantable Lead Model: 6935
Implantable Pulse Generator Implant Date: 20140402
Lead Channel Impedance Value: 228 Ohm
Lead Channel Impedance Value: 399 Ohm
Lead Channel Impedance Value: 418 Ohm
Lead Channel Impedance Value: 475 Ohm
Lead Channel Impedance Value: 513 Ohm
Lead Channel Impedance Value: 608 Ohm
Lead Channel Pacing Threshold Amplitude: 0.75 V
Lead Channel Pacing Threshold Amplitude: 0.875 V
Lead Channel Pacing Threshold Amplitude: 0.875 V
Lead Channel Pacing Threshold Pulse Width: 0.4 ms
Lead Channel Pacing Threshold Pulse Width: 0.4 ms
Lead Channel Pacing Threshold Pulse Width: 0.6 ms
Lead Channel Sensing Intrinsic Amplitude: 13.5 mV
Lead Channel Sensing Intrinsic Amplitude: 14.625 mV
Lead Channel Sensing Intrinsic Amplitude: 2 mV
Lead Channel Sensing Intrinsic Amplitude: 2 mV
Lead Channel Setting Pacing Amplitude: 1.5 V
Lead Channel Setting Pacing Amplitude: 2 V
Lead Channel Setting Pacing Amplitude: 2.5 V
Lead Channel Setting Pacing Pulse Width: 0.4 ms
Lead Channel Setting Pacing Pulse Width: 0.6 ms
Lead Channel Setting Sensing Sensitivity: 0.3 mV

## 2020-05-11 NOTE — Progress Notes (Signed)
Remote ICD transmission.   

## 2020-05-12 NOTE — Progress Notes (Signed)
EPIC Encounter for ICM Monitoring  Patient Name: [name] is a 86 y.o. male Date: 05/12/2020 Primary Care Physican: Robert Filler, MD Primary Cardiologist:[name] Electrophysiologist: [name] Pacing: 100% LastWeight:140lbs  Battery RRT16months  Transmission Reviewed  OptivolThoracic impedancenormal.  Prescribed:Furosemide20 mgPlace 1 tablet (40 mg total) into feeding tube 2 (two) times daily.  Labs: 02/10/2020 Creatinine 2.19, BUN 101, Potassium 4.4, Sodium 138, GFR 26-30 12/08/2019 Creatinine 1.85, BUN 46, Potassium 4.9, Sodium 135, GFR 32-37 12/07/2019 Creatinine 1.76, BUN 45, Potassium 5.8, Sodium 136, GFR 34-40 12/06/2019 Creatinine 1.71, BUN 39, Potassium 5.8, Sodium 135, GFR 35-41 A complete set of results can be found in Results Review.  Recommendations:No Changes  Follow-up plan: ICM clinic phone appointment on9/22/2021. 91 day device clinic remote transmission9/16/2021.   EP/Cardiology Office Visits: Recall for 03/06/2021 with Dr. Lovena [name].    Copy of ICM check sent to Dr. Lovena [name].    3 month ICM trend: 05/10/2020    1 Year ICM trend:       Robert Billings, RN 05/12/2020 11:23 AM

## 2020-05-24 ENCOUNTER — Encounter: Payer: Medicare HMO | Admitting: Student in an Organized Health Care Education/Training Program

## 2020-06-10 ENCOUNTER — Ambulatory Visit (INDEPENDENT_AMBULATORY_CARE_PROVIDER_SITE_OTHER): Payer: Medicare (Managed Care) | Admitting: *Deleted

## 2020-06-10 DIAGNOSIS — I472 Ventricular tachycardia, unspecified: Secondary | ICD-10-CM

## 2020-06-10 LAB — CUP PACEART REMOTE DEVICE CHECK
Battery Remaining Longevity: 4 mo
Battery Voltage: 2.8 V
Brady Statistic AP VP Percent: 7.14 %
Brady Statistic AP VS Percent: 0.01 %
Brady Statistic AS VP Percent: 92.81 %
Brady Statistic AS VS Percent: 0.04 %
Brady Statistic RA Percent Paced: 7.15 %
Brady Statistic RV Percent Paced: 99.92 %
Date Time Interrogation Session: 20210916033523
HighPow Impedance: 57 Ohm
Implantable Lead Implant Date: 20140402
Implantable Lead Implant Date: 20140402
Implantable Lead Implant Date: 20140402
Implantable Lead Location: 753858
Implantable Lead Location: 753859
Implantable Lead Location: 753860
Implantable Lead Model: 4194
Implantable Lead Model: 5076
Implantable Lead Model: 6935
Implantable Pulse Generator Implant Date: 20140402
Lead Channel Impedance Value: 228 Ohm
Lead Channel Impedance Value: 399 Ohm
Lead Channel Impedance Value: 399 Ohm
Lead Channel Impedance Value: 418 Ohm
Lead Channel Impedance Value: 475 Ohm
Lead Channel Impedance Value: 551 Ohm
Lead Channel Pacing Threshold Amplitude: 0.75 V
Lead Channel Pacing Threshold Amplitude: 0.875 V
Lead Channel Pacing Threshold Amplitude: 0.875 V
Lead Channel Pacing Threshold Pulse Width: 0.4 ms
Lead Channel Pacing Threshold Pulse Width: 0.4 ms
Lead Channel Pacing Threshold Pulse Width: 0.6 ms
Lead Channel Sensing Intrinsic Amplitude: 1.75 mV
Lead Channel Sensing Intrinsic Amplitude: 1.75 mV
Lead Channel Sensing Intrinsic Amplitude: 13.5 mV
Lead Channel Sensing Intrinsic Amplitude: 14.625 mV
Lead Channel Setting Pacing Amplitude: 1.5 V
Lead Channel Setting Pacing Amplitude: 2 V
Lead Channel Setting Pacing Amplitude: 2.5 V
Lead Channel Setting Pacing Pulse Width: 0.4 ms
Lead Channel Setting Pacing Pulse Width: 0.6 ms
Lead Channel Setting Sensing Sensitivity: 0.3 mV

## 2020-06-14 NOTE — Progress Notes (Signed)
Remote ICD transmission.   

## 2020-06-16 ENCOUNTER — Ambulatory Visit (INDEPENDENT_AMBULATORY_CARE_PROVIDER_SITE_OTHER): Payer: Medicare (Managed Care)

## 2020-06-16 DIAGNOSIS — Z95810 Presence of automatic (implantable) cardiac defibrillator: Secondary | ICD-10-CM

## 2020-06-16 DIAGNOSIS — I5022 Chronic systolic (congestive) heart failure: Secondary | ICD-10-CM

## 2020-06-18 NOTE — Progress Notes (Signed)
EPIC Encounter for ICM Monitoring  Patient Name: [name] is a 86 y.o. male Date: 06/18/2020 Primary Care Physican: Axel Filler, MD Primary Cardiologist:Taylor Electrophysiologist: Santina Evans Pacing: 99.7% LastWeight:140lbs  Battery RRT63months  Transmission Reviewed  OptivolThoracic impedancenormal.  Prescribed:Furosemide20 mgPlace 1 tablet (40 mg total) into feeding tube 2 (two) times daily.  Labs: 02/10/2020 Creatinine 2.19, BUN 101, Potassium 4.4, Sodium 138, GFR 26-30 12/08/2019 Creatinine 1.85, BUN 46, Potassium 4.9, Sodium 135, GFR 32-37 12/07/2019 Creatinine 1.76, BUN 45, Potassium 5.8, Sodium 136, GFR 34-40 12/06/2019 Creatinine 1.71, BUN 39, Potassium 5.8, Sodium 135, GFR 35-41 A complete set of results can be found in Results Review.  Recommendations:No Changes  Follow-up plan: ICM clinic phone appointment on10/25/2021. 91 day device clinic remote transmission10/18/2021.  EP/Cardiology Office Visits:Recall for 6/12/2022with Dr. Lovena Le.   Copy of ICM check sent to Dr.Taylor.    3 month ICM trend: 06/16/2020    1 Year ICM trend:       [name], RN 06/18/2020 2:40 PM

## 2020-07-07 ENCOUNTER — Other Ambulatory Visit: Payer: Self-pay | Admitting: Family Medicine

## 2020-07-07 ENCOUNTER — Ambulatory Visit
Admission: RE | Admit: 2020-07-07 | Discharge: 2020-07-07 | Disposition: A | Discharge status: Home / Self Care | Payer: Medicare (Managed Care) | Source: Ambulatory Visit | Attending: Family Medicine | Admitting: Family Medicine

## 2020-07-07 ENCOUNTER — Other Ambulatory Visit: Payer: Self-pay

## 2020-07-07 DIAGNOSIS — E43 Unspecified severe protein-calorie malnutrition: Secondary | ICD-10-CM

## 2020-07-12 ENCOUNTER — Ambulatory Visit (INDEPENDENT_AMBULATORY_CARE_PROVIDER_SITE_OTHER): Payer: Medicare (Managed Care)

## 2020-07-12 ENCOUNTER — Other Ambulatory Visit: Payer: Self-pay | Admitting: Family Medicine

## 2020-07-12 DIAGNOSIS — I472 Ventricular tachycardia, unspecified: Secondary | ICD-10-CM

## 2020-07-13 ENCOUNTER — Other Ambulatory Visit (HOSPITAL_COMMUNITY): Payer: Self-pay | Admitting: *Deleted

## 2020-07-13 DIAGNOSIS — R1310 Dysphagia, unspecified: Secondary | ICD-10-CM

## 2020-07-13 LAB — CUP PACEART REMOTE DEVICE CHECK
Battery Remaining Longevity: 3 mo
Battery Voltage: 2.78 V
Brady Statistic AP VP Percent: 13.86 %
Brady Statistic AP VS Percent: 0.01 %
Brady Statistic AS VP Percent: 86.07 %
Brady Statistic AS VS Percent: 0.07 %
Brady Statistic RA Percent Paced: 13.82 %
Brady Statistic RV Percent Paced: 99.65 %
Date Time Interrogation Session: 20211018073824
HighPow Impedance: 58 Ohm
Implantable Lead Implant Date: 20140402
Implantable Lead Implant Date: 20140402
Implantable Lead Implant Date: 20140402
Implantable Lead Location: 753858
Implantable Lead Location: 753859
Implantable Lead Location: 753860
Implantable Lead Model: 4194
Implantable Lead Model: 5076
Implantable Lead Model: 6935
Implantable Pulse Generator Implant Date: 20140402
Lead Channel Impedance Value: 228 Ohm
Lead Channel Impedance Value: 399 Ohm
Lead Channel Impedance Value: 399 Ohm
Lead Channel Impedance Value: 456 Ohm
Lead Channel Impedance Value: 475 Ohm
Lead Channel Impedance Value: 551 Ohm
Lead Channel Pacing Threshold Amplitude: 0.75 V
Lead Channel Pacing Threshold Amplitude: 0.75 V
Lead Channel Pacing Threshold Amplitude: 0.75 V
Lead Channel Pacing Threshold Pulse Width: 0.4 ms
Lead Channel Pacing Threshold Pulse Width: 0.4 ms
Lead Channel Pacing Threshold Pulse Width: 0.6 ms
Lead Channel Sensing Intrinsic Amplitude: 13.5 mV
Lead Channel Sensing Intrinsic Amplitude: 14.625 mV
Lead Channel Sensing Intrinsic Amplitude: 3.5 mV
Lead Channel Sensing Intrinsic Amplitude: 3.5 mV
Lead Channel Setting Pacing Amplitude: 1.25 V
Lead Channel Setting Pacing Amplitude: 2 V
Lead Channel Setting Pacing Amplitude: 2.5 V
Lead Channel Setting Pacing Pulse Width: 0.4 ms
Lead Channel Setting Pacing Pulse Width: 0.6 ms
Lead Channel Setting Sensing Sensitivity: 0.3 mV

## 2020-07-15 NOTE — Progress Notes (Signed)
Remote ICD transmission.   

## 2020-07-15 NOTE — Addendum Note (Signed)
Addended by: Douglass Rivers D on: 07/15/2020 01:24 PM   Modules accepted: Level of Service

## 2020-07-19 ENCOUNTER — Ambulatory Visit (INDEPENDENT_AMBULATORY_CARE_PROVIDER_SITE_OTHER): Payer: Medicare (Managed Care)

## 2020-07-19 DIAGNOSIS — I5022 Chronic systolic (congestive) heart failure: Secondary | ICD-10-CM | POA: Diagnosis not present

## 2020-07-19 DIAGNOSIS — Z95810 Presence of automatic (implantable) cardiac defibrillator: Secondary | ICD-10-CM

## 2020-07-22 ENCOUNTER — Ambulatory Visit (HOSPITAL_COMMUNITY)
Admission: RE | Admit: 2020-07-22 | Discharge: 2020-07-22 | Disposition: A | Discharge status: Home / Self Care | Payer: Medicare (Managed Care) | Source: Ambulatory Visit | Attending: Family Medicine | Admitting: Family Medicine

## 2020-07-22 ENCOUNTER — Other Ambulatory Visit: Payer: Self-pay

## 2020-07-22 DIAGNOSIS — R1310 Dysphagia, unspecified: Secondary | ICD-10-CM | POA: Insufficient documentation

## 2020-07-22 NOTE — Progress Notes (Signed)
Modified Barium Swallow Progress Note  Patient Details  Name: [name] MRN: [id number] Date of Birth: [birth year]  Today's Date: 07/22/2020  Modified Barium Swallow completed.  Full report located under Chart Review in the Imaging Section.  Brief recommendations include the following:  Clinical Impression Pt continues to demonstrate oropharyngeal dysphagia largely consistent with previous MBS in May 2021 with the exception of improvement of pharyngeal constriction and epiglottic deflection. He reports that he hasn't been consuming any POs lately and has received all of his nutrition from his PEG tube as he doesn't have anyone to help him with meals. His oral phase of swallowing was observed to be largely intact, however, he demonstrated some difficulty manipulating a straw. He demonstrated decreased pharyngeal constriction and epiglottic deflection, resulting in aspiration of thin liquid and frank penetration of nectar thick liquid. No penetration or aspiration occurred with honey thick liquid or puree. SLP instructed pt to take small, single sips of thin liquid with effortful swallow, which greatly reduced the amount of aspiration. Pt also demonstrated signficant pharyngeal residue in the valleculae and lateral channels, which at times contributed to instances of penetration/aspiration. Pt was instructed to produce dry swallows after each bite/sip in order to address residue. He independently produced occasional throat clearing following POs, which was encouraged by the SLP to continue.   Given pt's lack of support in diet preparation, thickened liquids is likely not sustainable for him. Therefore, recommend pt consume thin water only given concern for aspiration. Pt should continue to use small, single sips and effortful swallow. Dys 1 diet also recommended with ocassional dry swallows to address pharyngeal residue. Encourage use of occasional throat clearing. Pt was educated on diet  recommendation and compensatory strategies. Pt would benefit from f/u outpatient SLP.   Swallow Evaluation Recommendations       SLP Diet Recommendations: Thin liquid;Dysphagia 1 (Puree) solids (Thin liquid- water only)   Liquid Administration via: Cup;Straw   Medication Administration: Via alternative means   Supervision: Patient able to self feed;Intermittent supervision to cue for compensatory strategies   Compensations: Effortful swallow;Clear throat intermittently;Multiple dry swallows after each bite/sip;Small sips/bites       Oral Care Recommendations: Oral care BID        Greggory Keen 07/22/2020,1:24 PM

## 2020-07-26 NOTE — Progress Notes (Signed)
EPIC Encounter for ICM Monitoring  Patient Name: [name] is a 86 y.o. male Date: 07/26/2020 Primary Care Physican: Axel Filler, MD Primary Cardiologist:Taylor Electrophysiologist: Santina Evans Pacing: 99.8% LastWeight:140lbs  Battery RRT5months  Transmission Reviewed  OptivolThoracic impedancenormal.  Prescribed:Furosemide20 mgPlace 1 tablet (40 mg total) into feeding tube 2 (two) times daily.  Labs: 02/10/2020 Creatinine 2.19, BUN 101, Potassium 4.4, Sodium 138, GFR 26-30 12/08/2019 Creatinine 1.85, BUN 46, Potassium 4.9, Sodium 135, GFR 32-37 12/07/2019 Creatinine 1.76, BUN 45, Potassium 5.8, Sodium 136, GFR 34-40 12/06/2019 Creatinine 1.71, BUN 39, Potassium 5.8, Sodium 135, GFR 35-41 A complete set of results can be found in Results Review.  Recommendations:No Changes  Follow-up plan: ICM clinic phone appointment on11/29/2021. 91 day device clinic remote transmission12/20/2021.  EP/Cardiology Office Visits:Recall for6/12/2022with Dr. Lovena Le.   Copy of ICM check sent to Dr.Taylor.   3 month ICM trend: 07/19/2020    1 Year ICM trend:       [name], RN 07/26/2020 11:37 AM

## 2020-08-12 ENCOUNTER — Ambulatory Visit (INDEPENDENT_AMBULATORY_CARE_PROVIDER_SITE_OTHER): Payer: Medicare HMO

## 2020-08-12 DIAGNOSIS — I472 Ventricular tachycardia, unspecified: Secondary | ICD-10-CM

## 2020-08-12 LAB — CUP PACEART REMOTE DEVICE CHECK
Battery Remaining Longevity: 2 mo
Battery Voltage: 2.76 V
Brady Statistic AP VP Percent: 25.1 %
Brady Statistic AP VS Percent: 0.01 %
Brady Statistic AS VP Percent: 74.81 %
Brady Statistic AS VS Percent: 0.08 %
Brady Statistic RA Percent Paced: 25.06 %
Brady Statistic RV Percent Paced: 99.7 %
Date Time Interrogation Session: 20211118033622
HighPow Impedance: 59 Ohm
Implantable Lead Implant Date: 20140402
Implantable Lead Implant Date: 20140402
Implantable Lead Implant Date: 20140402
Implantable Lead Location: 753858
Implantable Lead Location: 753859
Implantable Lead Location: 753860
Implantable Lead Model: 4194
Implantable Lead Model: 5076
Implantable Lead Model: 6935
Implantable Pulse Generator Implant Date: 20140402
Lead Channel Impedance Value: 228 Ohm
Lead Channel Impedance Value: 399 Ohm
Lead Channel Impedance Value: 418 Ohm
Lead Channel Impedance Value: 513 Ohm
Lead Channel Impedance Value: 513 Ohm
Lead Channel Impedance Value: 646 Ohm
Lead Channel Pacing Threshold Amplitude: 0.75 V
Lead Channel Pacing Threshold Amplitude: 0.75 V
Lead Channel Pacing Threshold Amplitude: 0.75 V
Lead Channel Pacing Threshold Pulse Width: 0.4 ms
Lead Channel Pacing Threshold Pulse Width: 0.4 ms
Lead Channel Pacing Threshold Pulse Width: 0.6 ms
Lead Channel Sensing Intrinsic Amplitude: 13.5 mV
Lead Channel Sensing Intrinsic Amplitude: 14.625 mV
Lead Channel Sensing Intrinsic Amplitude: 3.75 mV
Lead Channel Sensing Intrinsic Amplitude: 3.75 mV
Lead Channel Setting Pacing Amplitude: 1.25 V
Lead Channel Setting Pacing Amplitude: 2 V
Lead Channel Setting Pacing Amplitude: 2.5 V
Lead Channel Setting Pacing Pulse Width: 0.4 ms
Lead Channel Setting Pacing Pulse Width: 0.6 ms
Lead Channel Setting Sensing Sensitivity: 0.3 mV

## 2020-08-13 NOTE — Progress Notes (Signed)
Remote ICD transmission.   

## 2020-08-17 NOTE — Addendum Note (Signed)
Addended by: Douglass Rivers D on: 08/17/2020 09:15 AM   Modules accepted: Level of Service

## 2020-08-23 ENCOUNTER — Ambulatory Visit (INDEPENDENT_AMBULATORY_CARE_PROVIDER_SITE_OTHER): Payer: Medicare (Managed Care)

## 2020-08-23 DIAGNOSIS — I5022 Chronic systolic (congestive) heart failure: Secondary | ICD-10-CM

## 2020-08-23 DIAGNOSIS — Z95810 Presence of automatic (implantable) cardiac defibrillator: Secondary | ICD-10-CM | POA: Diagnosis not present

## 2020-08-25 NOTE — Progress Notes (Signed)
EPIC Encounter for ICM Monitoring  Patient Name: [name] is a 86 y.o. male Date: 08/25/2020 Primary Care Physican: Axel Filler, MD Primary Cardiologist:Taylor Electrophysiologist: Santina Evans Pacing: 99.4% LastWeight:140lbs  Battery RRT39months  Transmission Reviewed  OptivolThoracic impedancenormal.  Prescribed:Furosemide20 mgPlace 1 tablet (40 mg total) into feeding tube 2 (two) times daily.  Labs: 02/10/2020 Creatinine 2.19, BUN 101, Potassium 4.4, Sodium 138, GFR 26-30 12/08/2019 Creatinine 1.85, BUN 46, Potassium 4.9, Sodium 135, GFR 32-37 12/07/2019 Creatinine 1.76, BUN 45, Potassium 5.8, Sodium 136, GFR 34-40 12/06/2019 Creatinine 1.71, BUN 39, Potassium 5.8, Sodium 135, GFR 35-41 A complete set of results can be found in Results Review.  Recommendations:No Changes  Follow-up plan: ICM clinic phone appointment on1/11/2020. 91 day device clinic remote transmission12/20/2021.  EP/Cardiology Office Visits:Recall for6/12/2022with Dr. Lovena Le.   Copy of ICM check sent to Dr.Taylor.   3 month ICM trend: 08/23/2020    1 Year ICM trend:       [name], RN 08/25/2020 2:07 PM

## 2020-08-31 ENCOUNTER — Ambulatory Visit: Payer: Medicare (Managed Care) | Admitting: Gastroenterology

## 2020-09-13 ENCOUNTER — Telehealth: Payer: Self-pay | Admitting: *Deleted

## 2020-09-13 ENCOUNTER — Ambulatory Visit (INDEPENDENT_AMBULATORY_CARE_PROVIDER_SITE_OTHER): Payer: Medicare (Managed Care)

## 2020-09-13 DIAGNOSIS — I472 Ventricular tachycardia, unspecified: Secondary | ICD-10-CM

## 2020-09-13 LAB — CUP PACEART REMOTE DEVICE CHECK
Battery Remaining Longevity: 2 mo
Battery Voltage: 2.74 V
Brady Statistic AP VP Percent: 15.43 %
Brady Statistic AP VS Percent: 0.03 %
Brady Statistic AS VP Percent: 83.53 %
Brady Statistic AS VS Percent: 1.01 %
Brady Statistic RA Percent Paced: 15.38 %
Brady Statistic RV Percent Paced: 98.53 %
Date Time Interrogation Session: 20211219001803
HighPow Impedance: 58 Ohm
Implantable Lead Implant Date: 20140402
Implantable Lead Implant Date: 20140402
Implantable Lead Implant Date: 20140402
Implantable Lead Location: 753858
Implantable Lead Location: 753859
Implantable Lead Location: 753860
Implantable Lead Model: 4194
Implantable Lead Model: 5076
Implantable Lead Model: 6935
Implantable Pulse Generator Implant Date: 20140402
Lead Channel Impedance Value: 228 Ohm
Lead Channel Impedance Value: 399 Ohm
Lead Channel Impedance Value: 418 Ohm
Lead Channel Impedance Value: 418 Ohm
Lead Channel Impedance Value: 475 Ohm
Lead Channel Impedance Value: 532 Ohm
Lead Channel Pacing Threshold Amplitude: 0.75 V
Lead Channel Pacing Threshold Amplitude: 0.75 V
Lead Channel Pacing Threshold Amplitude: 0.875 V
Lead Channel Pacing Threshold Pulse Width: 0.4 ms
Lead Channel Pacing Threshold Pulse Width: 0.4 ms
Lead Channel Pacing Threshold Pulse Width: 0.6 ms
Lead Channel Sensing Intrinsic Amplitude: 13.5 mV
Lead Channel Sensing Intrinsic Amplitude: 14.625 mV
Lead Channel Sensing Intrinsic Amplitude: 2 mV
Lead Channel Sensing Intrinsic Amplitude: 2 mV
Lead Channel Setting Pacing Amplitude: 1.5 V
Lead Channel Setting Pacing Amplitude: 2 V
Lead Channel Setting Pacing Amplitude: 2.5 V
Lead Channel Setting Pacing Pulse Width: 0.4 ms
Lead Channel Setting Pacing Pulse Width: 0.6 ms
Lead Channel Setting Sensing Sensitivity: 0.3 mV

## 2020-09-13 NOTE — Telephone Encounter (Signed)
CALLED PATIENT TO REMND OF LAB @ 1:30 PM @ [facility] ON 09-14-20 AND HIS FU WITH DR. Isidore Moos ON @ 2 PM @ [facility], LVM FOR A RETURN CALL

## 2020-09-14 ENCOUNTER — Ambulatory Visit
Admission: RE | Admit: 2020-09-14 | Discharge: 2020-09-14 | Disposition: A | Discharge status: Home / Self Care | Payer: Medicare (Managed Care) | Source: Ambulatory Visit | Attending: Radiation Oncology | Admitting: Radiation Oncology

## 2020-09-14 ENCOUNTER — Other Ambulatory Visit: Payer: Self-pay

## 2020-09-14 VITALS — BP 116/63 | HR 73 | Temp 97.3°F | Resp 18 | Wt 144.6 lb

## 2020-09-14 DIAGNOSIS — Z79899 Other long term (current) drug therapy: Secondary | ICD-10-CM | POA: Diagnosis not present

## 2020-09-14 DIAGNOSIS — Z923 Personal history of irradiation: Secondary | ICD-10-CM | POA: Insufficient documentation

## 2020-09-14 DIAGNOSIS — Z85819 Personal history of malignant neoplasm of unspecified site of lip, oral cavity, and pharynx: Secondary | ICD-10-CM | POA: Diagnosis not present

## 2020-09-14 DIAGNOSIS — E0789 Other specified disorders of thyroid: Secondary | ICD-10-CM | POA: Insufficient documentation

## 2020-09-14 DIAGNOSIS — C320 Malignant neoplasm of glottis: Secondary | ICD-10-CM

## 2020-09-14 DIAGNOSIS — Z7982 Long term (current) use of aspirin: Secondary | ICD-10-CM | POA: Insufficient documentation

## 2020-09-14 LAB — TSH: TSH: 1.946 u[IU]/mL (ref 0.320–4.118)

## 2020-09-14 MED ORDER — LARYNGOSCOPY SOLUTION RAD-ONC
15.0000 mL | Freq: Once | TOPICAL | Status: AC
  Administered 2020-09-14: 15 mL via TOPICAL
  Filled 2020-09-14: qty 15

## 2020-09-14 NOTE — Progress Notes (Signed)
[name] presents today for follow up after completing radiation to his glottis on 11/19/2019  Pain issues, if any: Patient denies Using a feeding tube?: Yes--reports doing 4 feedings a day. States he doesn't feel ready to try taking food/liquid by mouth yet Weight changes, if any:  Wt Readings from Last 3 Encounters:  09/14/20 144 lb 9.6 oz (65.6 kg)  03/23/20 130 lb (59 kg)  03/10/20 137 lb (62.1 kg)   Swallowing issues, if any: Denies any sore throat or trouble swallowing saliva, reports he just doesn't feel ready to try and eat/drink by mouth Smoking or chewing tobacco? None Using fluoride trays daily? N/A Last ENT visit was on: 05/19/2020 Saw Dr. Melida Quitter:  --Transnasal fiberoptic laryngoscopy "The fiberoptic laryngoscope was then placed through the nasal passage to view the pharynx and larynx. After completion, the telescope was removed. Findings included normal nasal passages, no mass or abnormality in the nasopharynx, and no mass or ulceration in the pharynx or larynx. Pyriform sinuses are open. Secretions are pooled in both pyriform sinuses. Vocal folds are without mass, scarring, or ulceration. The vocal folds adduct and abduct symmetrically. There is good glottal closure. Muscle tension patterns are not present. Laryngeal edema is minimal. --Impression & Plans:  There is no sign of recurrence on exam today. He was reassured. He plans to follow-up with Dr. Isidore Moos in December. I will have him follow-up with me in March."  Other notable issues, if any: Reports he fell last Tuesday, but denies any serious injury. Plans to get COVID booster in January when he's eligible to receive. Denies any lymphedema to neck/jaw. Denies any ear/jaw pain, or difficulty opening his mouth. Denies dry mouth but still deals with thick saliva/secretions.   Vitals:   09/14/20 1404  BP: 116/63  Pulse: 73  Resp: 18  Temp: (!) 97.3 F (36.3 C)  SpO2: 100%

## 2020-09-16 NOTE — Progress Notes (Signed)
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 States he doesn't feel ready to try taking food/liquid by mouth yet Weight changes, if any:  Wt Readings from Last 3 Encounters:  09/14/20 144 lb 9.6 oz (65.6 kg)  03/23/20 130 lb (59 kg)  03/10/20 137 lb (62.1 kg)   Swallowing issues, if any: Denies any sore throat or trouble swallowing saliva, reports he just doesn't feel ready to try and eat/drink by mouth Smoking or chewing tobacco? None Using fluoride trays daily? N/A Last ENT visit was on: 05/19/2020 Saw Dr. Melida Quitter:  --Transnasal fiberoptic laryngoscopy "The fiberoptic laryngoscope was then placed through the nasal passage to view the pharynx and larynx. After completion, the telescope was removed. Findings included normal nasal passages, no mass or abnormality in the nasopharynx, and no mass or ulceration in the pharynx or larynx. Pyriform sinuses are open. Secretions are pooled in both pyriform sinuses. Vocal folds are without mass, scarring, or ulceration. The vocal folds adduct and abduct symmetrically. There is good glottal closure. Muscle tension patterns are not  present. Laryngeal edema is minimal. --Impression & Plans:  There is no sign of recurrence on exam today. He was reassured. He plans to follow-up with Dr. Isidore Moos in December. I will have him follow-up with me in March."  Other notable issues, if any: Reports he fell last Tuesday, but denies any serious injury. Plans to get COVID booster in January when he's eligible to receive. Denies any lymphedema to neck/jaw. Denies any ear/jaw pain, or difficulty opening his mouth. Denies dry mouth but still deals with thick saliva/secretions.   Vitals:   09/14/20 1404  BP: 116/63  Pulse: 73  Resp: 18  Temp: (!) 97.3 F (36.3 C)  SpO2: 100%     ALLERGIES:  has No Known Allergies.  Meds: Current Outpatient Medications  Medication Sig Dispense Refill  . doxycycline (MONODOX) 50 MG capsule Take 50 mg by mouth 2 (two) times daily.    Marland Kitchen trimethoprim-polymyxin b (POLYTRIM) ophthalmic solution Place 1 drop into both eyes 4 (four) times daily.    . Amino Acids-Protein Hydrolys (FEEDING SUPPLEMENT, PRO-STAT SUGAR FREE 64,) LIQD Place 30 mLs into feeding tube daily. 887 mL 0  . aspirin EC 81 MG tablet Take 81 mg by mouth daily. Swallow whole.    Marland Kitchen atorvastatin (LIPITOR) 40 MG tablet TAKE 1 TABLET(40 MG) BY MOUTH DAILY AT 6 PM (Patient taking differently: Take 40 mg by mouth daily. ) 90 tablet 3  . Blood Glucose Monitoring Suppl (ACCU-CHEK AVIVA PLUS) w/Device KIT Check finger stick glucose once daily 1 kit 0  . carvedilol (COREG) 6.25 MG tablet  PLACE 1 TABLET INTO FEEDING TUBE TWICE DAILY WITH A MEAL (Patient taking differently: Take 6.25 mg by mouth 2 (two) times daily with a meal. ) 180 tablet 1  . fluorometholone (FML) 0.1 % ophthalmic suspension Place 2 drops into both eyes 2 (two) times daily.    . furosemide (LASIX) 40 MG tablet Place 1 tablet (40 mg total) into feeding tube 2 (two) times daily. 180 tablet 3  . glucose blood (ACCU-CHEK AVIVA PLUS) test strip Check blood sugar up to 3 times a day 300  each 5  . hydrocortisone cream 1 % Apply to affected area 2 times daily 15 g 0  . Hydrocortisone, Perianal, (PROCTO-PAK) 1 % CREA Apply 1 application topically 2 (two) times daily. 28 g 0  . insulin glargine (LANTUS) 100 UNIT/ML injection Inject 0.08 mLs (8 Units total) into the skin daily. 10 mL 11  . Insulin Pen Needle (B-D ULTRAFINE III SHORT PEN) 31G X 8 MM MISC USE ONCE DAILY WITH LANTUS PEN 100 each 3  . lidocaine (XYLOCAINE) 2 % solution Patient: Mix 1part 2% viscous lidocaine, 1part H20. Swallow 61mL of diluted mixture, 9min before meals and at bedtime, up to QID 200 mL 3  . Nutritional Supplements (FEEDING SUPPLEMENT, NEPRO CARB STEADY,) LIQD Place 237 mLs into feeding tube 4 (four) times daily.  0  . ofloxacin (OCUFLOX) 0.3 % ophthalmic solution Place 1 drop into both eyes 4 (four) times daily.     . ondansetron (ZOFRAN) 4 MG tablet Place 1 tablet (4 mg total) into feeding tube every 6 (six) hours. 15 tablet 0  . pantoprazole (PROTONIX) 40 MG tablet TAKE 1 TABLET(40 MG) BY MOUTH DAILY 90 tablet 0  . polyethylene glycol (MIRALAX / GLYCOLAX) 17 g packet Take 17 g by mouth daily. 14 each 0  . senna-docusate (SENOKOT-S) 8.6-50 MG tablet Place 2 tablets into feeding tube daily. 30 tablet 2  . tamsulosin (FLOMAX) 0.4 MG CAPS capsule Take 0.8 mg by mouth at bedtime.     . VENTOLIN HFA 108 (90 Base) MCG/ACT inhaler INHALE 2 PUFFS INTO THE LUNGS EVERY 6 HOURS AS NEEDED FOR WHEEZING OR SHORTNESS OF BREATH (Patient taking differently: Inhale 2 puffs into the lungs every 6 (six) hours as needed for wheezing or shortness of breath. ) 18 g 5   No current facility-administered medications for this encounter.    Physical Findings: The patient is in no acute distress. Patient is alert and oriented. Wt Readings from Last 3 Encounters:  09/14/20 144 lb 9.6 oz (65.6 kg)  03/23/20 130 lb (59 kg)  03/10/20 137 lb (62.1 kg)    weight is 144 lb 9.6 oz (65.6 kg). His temporal temperature is 97.3 F (36.3  C) (abnormal). His blood pressure is 116/63 and his pulse is 73. His respiration is 18 and oxygen saturation is 100%. .  General: Alert and oriented, in no acute distress HEENT: Oral cavity and oropharynx is notable for no oral thrush, no tumors; he is edentulous Neck: Neck is notable for no palpable adenopathy Skin: Skin in treatment fields shows satisfactory healing with residual pigmentation changes from radiotherapy Chest clear to auscultation bilaterally Heart regular in rate and rhythm   PROCEDURE NOTE: After obtaining consent and anesthetizing the nasal cavity with topical lidocaine and phenylephrine, the flexible endoscope was introduced and passed through the nasal cavity.  Unfortunately the patient had more difficulty than usual tolerating the procedure and the examination was hindered by the patient talking, moving, and coughing. No obvious  sign of tumor in the nasopharynx, pharynx, or larynx.  The vocal cords appeared to move symmetrically.  However, I was not able to visualize the cords completely.    Lab Findings: Lab Results  Component Value Date   WBC 8.9 02/10/2020   HGB 14.0 02/10/2020   HCT 45.2 02/10/2020   MCV 96.2 02/10/2020   PLT 152 02/10/2020    Lab Results  Component Value Date   TSH 1.946 09/14/2020    Radiographic Findings: CUP PACEART REMOTE DEVICE CHECK  Result Date: 09/13/2020 Scheduled remote reviewed. Normal device function.  Estimated longevity 2 months, pt transmitting wirelessly Next remote 91 days.   Impression/Plan:    1) Head and Neck Cancer Status: No obvious evidence of disease though today's examination was suboptimal during his laryngoscopy  2) Nutritional Status:   Wt Readings from Last 3 Encounters:  09/14/20 144 lb 9.6 oz (65.6 kg)  03/23/20 130 lb (59 kg)  03/10/20 137 lb (62.1 kg)   PEG tube: Using.    3) Risk Factors: The patient has been educated about risk factors including alcohol and tobacco abuse; they understand that  avoidance of alcohol and tobacco is important to prevent recurrences as well as other cancers.  He reports abstaining from smoking.  4) Swallowing: Continue swallowing therapy; he is PEG dependent and reports that he just does not feel ready to try to eat or drink by mouth right now; he was seen by speech-language pathology about a month and a half ago and underwent swallowing study at that time.  Is not clear if he is getting outpatient therapy any longer and so I will ask our navigator to look into this.  5)  Thyroid function: WNL  Lab Results  Component Value Date   TSH 1.946 09/14/2020    6)  Follow-up in 2.5 months with otolaryngology and with me in 6 months. The patient was encouraged to call with any issues or questions before then.    On date of service, in total, I spent 25 minutes on this encounter.  He was seen in person.  _________________________   Eppie Gibson, MD

## 2020-09-21 ENCOUNTER — Other Ambulatory Visit: Payer: Self-pay

## 2020-09-21 ENCOUNTER — Inpatient Hospital Stay (HOSPITAL_COMMUNITY)
Admission: EM | Admit: 2020-09-21 | Discharge: 2020-09-28 | DRG: 640 | Disposition: A | Discharge status: Home Health | Payer: Medicare (Managed Care) | Attending: Internal Medicine | Admitting: Internal Medicine

## 2020-09-21 DIAGNOSIS — I5022 Chronic systolic (congestive) heart failure: Secondary | ICD-10-CM | POA: Diagnosis present

## 2020-09-21 DIAGNOSIS — H04123 Dry eye syndrome of bilateral lacrimal glands: Secondary | ICD-10-CM | POA: Diagnosis present

## 2020-09-21 DIAGNOSIS — N183 Chronic kidney disease, stage 3 unspecified: Secondary | ICD-10-CM | POA: Diagnosis present

## 2020-09-21 DIAGNOSIS — R1110 Vomiting, unspecified: Secondary | ICD-10-CM

## 2020-09-21 DIAGNOSIS — Z87442 Personal history of urinary calculi: Secondary | ICD-10-CM

## 2020-09-21 DIAGNOSIS — E43 Unspecified severe protein-calorie malnutrition: Secondary | ICD-10-CM | POA: Diagnosis present

## 2020-09-21 DIAGNOSIS — R040 Epistaxis: Secondary | ICD-10-CM | POA: Diagnosis present

## 2020-09-21 DIAGNOSIS — Z79899 Other long term (current) drug therapy: Secondary | ICD-10-CM

## 2020-09-21 DIAGNOSIS — Z7982 Long term (current) use of aspirin: Secondary | ICD-10-CM

## 2020-09-21 DIAGNOSIS — Z9841 Cataract extraction status, right eye: Secondary | ICD-10-CM

## 2020-09-21 DIAGNOSIS — Z833 Family history of diabetes mellitus: Secondary | ICD-10-CM

## 2020-09-21 DIAGNOSIS — Z9842 Cataract extraction status, left eye: Secondary | ICD-10-CM

## 2020-09-21 DIAGNOSIS — E871 Hypo-osmolality and hyponatremia: Secondary | ICD-10-CM | POA: Diagnosis not present

## 2020-09-21 DIAGNOSIS — C320 Malignant neoplasm of glottis: Secondary | ICD-10-CM

## 2020-09-21 DIAGNOSIS — Z923 Personal history of irradiation: Secondary | ICD-10-CM

## 2020-09-21 DIAGNOSIS — Z681 Body mass index (BMI) 19 or less, adult: Secondary | ICD-10-CM

## 2020-09-21 DIAGNOSIS — R1313 Dysphagia, pharyngeal phase: Secondary | ICD-10-CM | POA: Diagnosis present

## 2020-09-21 DIAGNOSIS — Z794 Long term (current) use of insulin: Secondary | ICD-10-CM

## 2020-09-21 DIAGNOSIS — I252 Old myocardial infarction: Secondary | ICD-10-CM

## 2020-09-21 DIAGNOSIS — I5042 Chronic combined systolic (congestive) and diastolic (congestive) heart failure: Secondary | ICD-10-CM | POA: Diagnosis present

## 2020-09-21 DIAGNOSIS — C140 Malignant neoplasm of pharynx, unspecified: Secondary | ICD-10-CM | POA: Diagnosis present

## 2020-09-21 DIAGNOSIS — Z931 Gastrostomy status: Secondary | ICD-10-CM

## 2020-09-21 DIAGNOSIS — N1832 Chronic kidney disease, stage 3b: Secondary | ICD-10-CM | POA: Diagnosis present

## 2020-09-21 DIAGNOSIS — Z87891 Personal history of nicotine dependence: Secondary | ICD-10-CM

## 2020-09-21 DIAGNOSIS — E785 Hyperlipidemia, unspecified: Secondary | ICD-10-CM | POA: Diagnosis present

## 2020-09-21 DIAGNOSIS — I454 Nonspecific intraventricular block: Secondary | ICD-10-CM | POA: Diagnosis present

## 2020-09-21 DIAGNOSIS — Z20822 Contact with and (suspected) exposure to covid-19: Secondary | ICD-10-CM | POA: Diagnosis present

## 2020-09-21 DIAGNOSIS — Z8673 Personal history of transient ischemic attack (TIA), and cerebral infarction without residual deficits: Secondary | ICD-10-CM

## 2020-09-21 DIAGNOSIS — Z95810 Presence of automatic (implantable) cardiac defibrillator: Secondary | ICD-10-CM

## 2020-09-21 DIAGNOSIS — I739 Peripheral vascular disease, unspecified: Secondary | ICD-10-CM

## 2020-09-21 DIAGNOSIS — E1151 Type 2 diabetes mellitus with diabetic peripheral angiopathy without gangrene: Secondary | ICD-10-CM | POA: Diagnosis present

## 2020-09-21 DIAGNOSIS — I130 Hypertensive heart and chronic kidney disease with heart failure and stage 1 through stage 4 chronic kidney disease, or unspecified chronic kidney disease: Secondary | ICD-10-CM | POA: Diagnosis present

## 2020-09-21 DIAGNOSIS — Z961 Presence of intraocular lens: Secondary | ICD-10-CM | POA: Diagnosis present

## 2020-09-22 ENCOUNTER — Emergency Department (HOSPITAL_COMMUNITY): Payer: Medicare (Managed Care)

## 2020-09-22 ENCOUNTER — Other Ambulatory Visit: Payer: Self-pay

## 2020-09-22 ENCOUNTER — Encounter (HOSPITAL_COMMUNITY): Payer: Self-pay | Admitting: Emergency Medicine

## 2020-09-22 DIAGNOSIS — Z9842 Cataract extraction status, left eye: Secondary | ICD-10-CM | POA: Diagnosis not present

## 2020-09-22 DIAGNOSIS — R040 Epistaxis: Secondary | ICD-10-CM | POA: Diagnosis present

## 2020-09-22 DIAGNOSIS — R1313 Dysphagia, pharyngeal phase: Secondary | ICD-10-CM | POA: Diagnosis present

## 2020-09-22 DIAGNOSIS — I454 Nonspecific intraventricular block: Secondary | ICD-10-CM | POA: Diagnosis present

## 2020-09-22 DIAGNOSIS — E871 Hypo-osmolality and hyponatremia: Secondary | ICD-10-CM | POA: Diagnosis present

## 2020-09-22 DIAGNOSIS — Z20822 Contact with and (suspected) exposure to covid-19: Secondary | ICD-10-CM | POA: Diagnosis present

## 2020-09-22 DIAGNOSIS — Z95810 Presence of automatic (implantable) cardiac defibrillator: Secondary | ICD-10-CM | POA: Diagnosis not present

## 2020-09-22 DIAGNOSIS — Z923 Personal history of irradiation: Secondary | ICD-10-CM | POA: Diagnosis not present

## 2020-09-22 DIAGNOSIS — I5042 Chronic combined systolic (congestive) and diastolic (congestive) heart failure: Secondary | ICD-10-CM | POA: Diagnosis present

## 2020-09-22 DIAGNOSIS — N1832 Chronic kidney disease, stage 3b: Secondary | ICD-10-CM | POA: Diagnosis present

## 2020-09-22 DIAGNOSIS — H04123 Dry eye syndrome of bilateral lacrimal glands: Secondary | ICD-10-CM | POA: Diagnosis present

## 2020-09-22 DIAGNOSIS — E43 Unspecified severe protein-calorie malnutrition: Secondary | ICD-10-CM | POA: Diagnosis present

## 2020-09-22 DIAGNOSIS — E785 Hyperlipidemia, unspecified: Secondary | ICD-10-CM | POA: Diagnosis present

## 2020-09-22 DIAGNOSIS — Z7982 Long term (current) use of aspirin: Secondary | ICD-10-CM | POA: Diagnosis not present

## 2020-09-22 DIAGNOSIS — E1151 Type 2 diabetes mellitus with diabetic peripheral angiopathy without gangrene: Secondary | ICD-10-CM | POA: Diagnosis present

## 2020-09-22 DIAGNOSIS — I10 Essential (primary) hypertension: Secondary | ICD-10-CM | POA: Diagnosis not present

## 2020-09-22 DIAGNOSIS — I252 Old myocardial infarction: Secondary | ICD-10-CM | POA: Diagnosis not present

## 2020-09-22 DIAGNOSIS — R1110 Vomiting, unspecified: Secondary | ICD-10-CM | POA: Diagnosis present

## 2020-09-22 DIAGNOSIS — Z8673 Personal history of transient ischemic attack (TIA), and cerebral infarction without residual deficits: Secondary | ICD-10-CM | POA: Diagnosis not present

## 2020-09-22 DIAGNOSIS — Z794 Long term (current) use of insulin: Secondary | ICD-10-CM | POA: Diagnosis not present

## 2020-09-22 DIAGNOSIS — Z681 Body mass index (BMI) 19 or less, adult: Secondary | ICD-10-CM | POA: Diagnosis not present

## 2020-09-22 DIAGNOSIS — Z87442 Personal history of urinary calculi: Secondary | ICD-10-CM | POA: Diagnosis not present

## 2020-09-22 DIAGNOSIS — I130 Hypertensive heart and chronic kidney disease with heart failure and stage 1 through stage 4 chronic kidney disease, or unspecified chronic kidney disease: Secondary | ICD-10-CM | POA: Diagnosis present

## 2020-09-22 DIAGNOSIS — C140 Malignant neoplasm of pharynx, unspecified: Secondary | ICD-10-CM | POA: Diagnosis present

## 2020-09-22 DIAGNOSIS — Z79899 Other long term (current) drug therapy: Secondary | ICD-10-CM | POA: Diagnosis not present

## 2020-09-22 DIAGNOSIS — Z961 Presence of intraocular lens: Secondary | ICD-10-CM | POA: Diagnosis present

## 2020-09-22 HISTORY — DX: Hypo-osmolality and hyponatremia: E87.1

## 2020-09-22 LAB — BASIC METABOLIC PANEL
Anion gap: 9 (ref 5–15)
Anion gap: 10 (ref 5–15)
Anion gap: 12 (ref 5–15)
Anion gap: 9 (ref 5–15)
BUN: 106 mg/dL — ABNORMAL HIGH (ref 8–23)
BUN: 104 mg/dL — ABNORMAL HIGH (ref 8–23)
BUN: 105 mg/dL — ABNORMAL HIGH (ref 8–23)
BUN: 104 mg/dL — ABNORMAL HIGH (ref 8–23)
CO2: 30 mmol/L (ref 22–32)
CO2: 30 mmol/L (ref 22–32)
CO2: 29 mmol/L (ref 22–32)
CO2: 31 mmol/L (ref 22–32)
Calcium: 9.4 mg/dL (ref 8.9–10.3)
Calcium: 9.6 mg/dL (ref 8.9–10.3)
Calcium: 9.7 mg/dL (ref 8.9–10.3)
Calcium: 9.4 mg/dL (ref 8.9–10.3)
Chloride: 77 mmol/L — ABNORMAL LOW (ref 98–111)
Chloride: 79 mmol/L — ABNORMAL LOW (ref 98–111)
Chloride: 80 mmol/L — ABNORMAL LOW (ref 98–111)
Chloride: 82 mmol/L — ABNORMAL LOW (ref 98–111)
Creatinine, Ser: 1.76 mg/dL — ABNORMAL HIGH (ref 0.61–1.24)
Creatinine, Ser: 1.74 mg/dL — ABNORMAL HIGH (ref 0.61–1.24)
Creatinine, Ser: 1.92 mg/dL — ABNORMAL HIGH (ref 0.61–1.24)
Creatinine, Ser: 1.8 mg/dL — ABNORMAL HIGH (ref 0.61–1.24)
GFR, Estimated: 37 mL/min — ABNORMAL LOW (ref >60)
GFR, Estimated: 37 mL/min — ABNORMAL LOW (ref >60)
GFR, Estimated: 33 mL/min — ABNORMAL LOW (ref >60)
GFR, Estimated: 36 mL/min — ABNORMAL LOW (ref >60)
Glucose, Bld: 172 mg/dL — ABNORMAL HIGH (ref 70–99)
Glucose, Bld: 144 mg/dL — ABNORMAL HIGH (ref 70–99)
Glucose, Bld: 133 mg/dL — ABNORMAL HIGH (ref 70–99)
Glucose, Bld: 154 mg/dL — ABNORMAL HIGH (ref 70–99)
Potassium: 3.8 mmol/L (ref 3.5–5.1)
Potassium: 3.8 mmol/L (ref 3.5–5.1)
Potassium: 4.1 mmol/L (ref 3.5–5.1)
Potassium: 3.9 mmol/L (ref 3.5–5.1)
Sodium: 116 mmol/L — CL (ref 135–145)
Sodium: 119 mmol/L — CL (ref 135–145)
Sodium: 121 mmol/L — ABNORMAL LOW (ref 135–145)
Sodium: 122 mmol/L — ABNORMAL LOW (ref 135–145)

## 2020-09-22 LAB — CBC
HCT: 31.3 % — ABNORMAL LOW (ref 39.0–52.0)
Hemoglobin: 11.1 g/dL — ABNORMAL LOW (ref 13.0–17.0)
MCH: 31.5 pg (ref 26.0–34.0)
MCHC: 35.5 g/dL (ref 30.0–36.0)
MCV: 88.9 fL (ref 80.0–100.0)
Platelets: 170 10*3/uL (ref 150–400)
RBC: 3.52 MIL/uL — ABNORMAL LOW (ref 4.22–5.81)
RDW: 13.1 % (ref 11.5–15.5)
WBC: 11.1 10*3/uL — ABNORMAL HIGH (ref 4.0–10.5)
nRBC: 0 % (ref 0.0–0.2)

## 2020-09-22 LAB — I-STAT CHEM 8, ED
BUN: 126 mg/dL — ABNORMAL HIGH (ref 8–23)
Calcium, Ion: 1.18 mmol/L (ref 1.15–1.40)
Chloride: 74 mmol/L — ABNORMAL LOW (ref 98–111)
Creatinine, Ser: 1.8 mg/dL — ABNORMAL HIGH (ref 0.61–1.24)
Glucose, Bld: 200 mg/dL — ABNORMAL HIGH (ref 70–99)
HCT: 39 % (ref 39.0–52.0)
Hemoglobin: 13.3 g/dL (ref 13.0–17.0)
Potassium: 4.4 mmol/L (ref 3.5–5.1)
Sodium: 114 mmol/L — CL (ref 135–145)
TCO2: 34 mmol/L — ABNORMAL HIGH (ref 22–32)

## 2020-09-22 LAB — TROPONIN I (HIGH SENSITIVITY)
Troponin I (High Sensitivity): 57 ng/L — ABNORMAL HIGH (ref <18)
Troponin I (High Sensitivity): 52 ng/L — ABNORMAL HIGH (ref <18)

## 2020-09-22 LAB — RESP PANEL BY RT-PCR (FLU A&B, COVID) ARPGX2
Influenza A by PCR: NEGATIVE
Influenza B by PCR: NEGATIVE
SARS Coronavirus 2 by RT PCR: NEGATIVE

## 2020-09-22 LAB — URINALYSIS, ROUTINE W REFLEX MICROSCOPIC
Bacteria, UA: NONE SEEN
Bilirubin Urine: NEGATIVE
Glucose, UA: NEGATIVE mg/dL
Ketones, ur: NEGATIVE mg/dL
Nitrite: NEGATIVE
Protein, ur: NEGATIVE mg/dL
Specific Gravity, Urine: 1.004 — ABNORMAL LOW (ref 1.005–1.030)
pH: 8 (ref 5.0–8.0)

## 2020-09-22 LAB — COMPREHENSIVE METABOLIC PANEL
ALT: 32 U/L (ref 0–44)
AST: 48 U/L — ABNORMAL HIGH (ref 15–41)
Albumin: 3.3 g/dL — ABNORMAL LOW (ref 3.5–5.0)
Alkaline Phosphatase: 176 U/L — ABNORMAL HIGH (ref 38–126)
Anion gap: 9 (ref 5–15)
BUN: 108 mg/dL — ABNORMAL HIGH (ref 8–23)
CO2: 30 mmol/L (ref 22–32)
Calcium: 9.4 mg/dL (ref 8.9–10.3)
Chloride: 75 mmol/L — ABNORMAL LOW (ref 98–111)
Creatinine, Ser: 1.8 mg/dL — ABNORMAL HIGH (ref 0.61–1.24)
GFR, Estimated: 36 mL/min — ABNORMAL LOW (ref >60)
Glucose, Bld: 234 mg/dL — ABNORMAL HIGH (ref 70–99)
Potassium: 3.9 mmol/L (ref 3.5–5.1)
Sodium: 114 mmol/L — CL (ref 135–145)
Total Bilirubin: 0.3 mg/dL (ref 0.3–1.2)
Total Protein: 6.8 g/dL (ref 6.5–8.1)

## 2020-09-22 LAB — GLUCOSE, CAPILLARY
Glucose-Capillary: 127 mg/dL — ABNORMAL HIGH (ref 70–99)
Glucose-Capillary: 155 mg/dL — ABNORMAL HIGH (ref 70–99)

## 2020-09-22 LAB — CBG MONITORING, ED
Glucose-Capillary: 162 mg/dL — ABNORMAL HIGH (ref 70–99)
Glucose-Capillary: 174 mg/dL — ABNORMAL HIGH (ref 70–99)
Glucose-Capillary: 138 mg/dL — ABNORMAL HIGH (ref 70–99)
Glucose-Capillary: 166 mg/dL — ABNORMAL HIGH (ref 70–99)

## 2020-09-22 LAB — OSMOLALITY, URINE: Osmolality, Ur: 203 mOsm/kg — ABNORMAL LOW (ref 300–900)

## 2020-09-22 LAB — SODIUM, URINE, RANDOM: Sodium, Ur: 30 mmol/L

## 2020-09-22 LAB — PROTIME-INR
INR: 1.1 (ref 0.8–1.2)
Prothrombin Time: 13.3 seconds (ref 11.4–15.2)

## 2020-09-22 LAB — OSMOLALITY: Osmolality: 293 mOsm/kg (ref 275–295)

## 2020-09-22 LAB — HEMOGLOBIN A1C
Hgb A1c MFr Bld: 6.9 % — ABNORMAL HIGH (ref 4.8–5.6)
Mean Plasma Glucose: 151.33 mg/dL

## 2020-09-22 LAB — APTT: aPTT: 33 seconds (ref 24–36)

## 2020-09-22 LAB — LIPASE, BLOOD: Lipase: 71 U/L — ABNORMAL HIGH (ref 11–51)

## 2020-09-22 LAB — BRAIN NATRIURETIC PEPTIDE: B Natriuretic Peptide: 413.3 pg/mL — ABNORMAL HIGH (ref 0.0–100.0)

## 2020-09-22 MED ORDER — PANTOPRAZOLE SODIUM 40 MG PO PACK
40.0000 mg | PACK | Freq: Every day | ORAL | Status: DC
  Administered 2020-09-23 – 2020-09-28 (×5): 40 mg
  Filled 2020-09-22 (×7): qty 20

## 2020-09-22 MED ORDER — ALBUTEROL SULFATE HFA 108 (90 BASE) MCG/ACT IN AERS
2.0000 | INHALATION_SPRAY | Freq: Four times a day (QID) | RESPIRATORY_TRACT | Status: DC | PRN
  Filled 2020-09-22: qty 6.7

## 2020-09-22 MED ORDER — INSULIN ASPART 100 UNIT/ML ~~LOC~~ SOLN
0.0000 [IU] | SUBCUTANEOUS | Status: DC
  Administered 2020-09-23 – 2020-09-24 (×2): 1 [IU] via SUBCUTANEOUS
  Administered 2020-09-24: 2 [IU] via SUBCUTANEOUS
  Administered 2020-09-24: 1 [IU] via SUBCUTANEOUS
  Administered 2020-09-25: 2 [IU] via SUBCUTANEOUS
  Administered 2020-09-25: 1 [IU] via SUBCUTANEOUS
  Administered 2020-09-25: 2 [IU] via SUBCUTANEOUS
  Administered 2020-09-25: 1 [IU] via SUBCUTANEOUS
  Administered 2020-09-26 (×2): 2 [IU] via SUBCUTANEOUS
  Administered 2020-09-26: 1 [IU] via SUBCUTANEOUS
  Administered 2020-09-26 – 2020-09-27 (×4): 2 [IU] via SUBCUTANEOUS
  Administered 2020-09-27: 1 [IU] via SUBCUTANEOUS
  Administered 2020-09-27 (×2): 2 [IU] via SUBCUTANEOUS
  Administered 2020-09-28: 3 [IU] via SUBCUTANEOUS

## 2020-09-22 MED ORDER — TAMSULOSIN HCL 0.4 MG PO CAPS
0.8000 mg | ORAL_CAPSULE | Freq: Every day | ORAL | Status: DC
  Administered 2020-09-22 – 2020-09-26 (×4): 0.8 mg via ORAL
  Filled 2020-09-22 (×6): qty 2

## 2020-09-22 MED ORDER — FUROSEMIDE 10 MG/ML IJ SOLN: 40.0000 mg | Freq: Once | INTRAMUSCULAR | Status: DC

## 2020-09-22 MED ORDER — ATORVASTATIN CALCIUM 40 MG PO TABS
40.0000 mg | ORAL_TABLET | Freq: Every day | ORAL | Status: DC
  Administered 2020-09-23 – 2020-09-28 (×6): 40 mg
  Filled 2020-09-22 (×6): qty 1

## 2020-09-22 MED ORDER — ACETAMINOPHEN 325 MG PO TABS
650.0000 mg | ORAL_TABLET | Freq: Four times a day (QID) | ORAL | Status: DC | PRN
  Administered 2020-09-22 – 2020-09-26 (×4): 650 mg
  Filled 2020-09-22 (×5): qty 2

## 2020-09-22 MED ORDER — POLYETHYLENE GLYCOL 3350 17 G PO PACK: 17.0000 g | PACK | Freq: Every day | ORAL | Status: DC | PRN

## 2020-09-22 MED ORDER — PROSOURCE TF PO LIQD
45.0000 mL | Freq: Every day | ORAL | Status: DC
  Administered 2020-09-22 – 2020-09-24 (×3): 45 mL
  Filled 2020-09-22 (×3): qty 45

## 2020-09-22 MED ORDER — ACETAMINOPHEN 325 MG PO TABS: 650.0000 mg | ORAL_TABLET | Freq: Four times a day (QID) | ORAL | Status: DC | PRN

## 2020-09-22 MED ORDER — CARVEDILOL 6.25 MG PO TABS
6.2500 mg | ORAL_TABLET | Freq: Two times a day (BID) | ORAL | Status: DC
  Administered 2020-09-23: 17:00:00 6.25 mg via ORAL
  Filled 2020-09-22 (×3): qty 1

## 2020-09-22 MED ORDER — ATORVASTATIN CALCIUM 40 MG PO TABS
40.0000 mg | ORAL_TABLET | Freq: Every day | ORAL | Status: DC
  Filled 2020-09-22: qty 1

## 2020-09-22 MED ORDER — PANTOPRAZOLE SODIUM 40 MG PO TBEC: 40.0000 mg | DELAYED_RELEASE_TABLET | Freq: Every day | ORAL | Status: DC

## 2020-09-22 MED ORDER — ACETAMINOPHEN 650 MG RE SUPP: 650.0000 mg | Freq: Four times a day (QID) | RECTAL | Status: DC | PRN

## 2020-09-22 MED ORDER — INSULIN GLARGINE 100 UNIT/ML ~~LOC~~ SOLN
5.0000 [IU] | Freq: Every day | SUBCUTANEOUS | Status: DC
  Administered 2020-09-22: 22:00:00 5 [IU] via SUBCUTANEOUS
  Filled 2020-09-22 (×2): qty 0.05

## 2020-09-22 MED ORDER — SENNOSIDES-DOCUSATE SODIUM 8.6-50 MG PO TABS
2.0000 | ORAL_TABLET | Freq: Every day | ORAL | Status: DC
  Administered 2020-09-23 – 2020-09-26 (×3): 2
  Filled 2020-09-22 (×4): qty 2

## 2020-09-22 MED ORDER — PRO-STAT SUGAR FREE PO LIQD
30.0000 mL | Freq: Every day | ORAL | Status: DC
  Filled 2020-09-22: qty 30

## 2020-09-22 MED ORDER — FUROSEMIDE 10 MG/ML IJ SOLN
60.0000 mg | Freq: Once | INTRAMUSCULAR | Status: AC
  Administered 2020-09-22: 10:00:00 60 mg via INTRAVENOUS
  Filled 2020-09-22: qty 6

## 2020-09-22 MED ORDER — INSULIN ASPART 100 UNIT/ML ~~LOC~~ SOLN
0.0000 [IU] | SUBCUTANEOUS | Status: DC
  Administered 2020-09-22 (×3): 2 [IU] via SUBCUTANEOUS

## 2020-09-22 NOTE — ED Notes (Signed)
Patient transported to Ultrasound 

## 2020-09-22 NOTE — ED Notes (Signed)
Lab BMP sent.

## 2020-09-22 NOTE — ED Triage Notes (Signed)
Pt BIB [facility] from an independent living facility, c/o generalized weakness, vomiting and a nose bleed x 1 week. Pt with g-tube.

## 2020-09-22 NOTE — ED Provider Notes (Addendum)
[facility] EMERGENCY DEPARTMENT Provider Note  CSN: [id number] Arrival date & time: 09/21/20 2347  Chief Complaint(s) Emesis  HPI [name] is a 86 y.o. male with a past medical history listed below including HTN, DM, systolic heart failure with EF of 25% with ACD on Lasix, throat cancer s/p radiation.   Here for several hours of generalized fatigue with nausea and nonbloody nonbilious emesis.  Patient also reports associated epistaxis due to the emesis. No alleviating or aggravating factors. Patient reports being in his normal state of health up until earlier this evening. Reports that he is compliant with all his medications. Denies any recent fevers or infections. No coughing or congestion. No chest pain or shortness of breath. No abdominal pain. Patient still having bowel movements. No known sick contacts. Denies alcohol or drug use.  HPI  Past Medical History Past Medical History:  Diagnosis Date   Adenomatous colon polyp 02/14/2012   AICD (automatic cardioverter/defibrillator) present    Medtronic    BBB (bundle branch block)    right   Carotid stenosis    a. s/p L carotid stent 2004;  b. Carotid US (09/2014): Bilateral ICA 1-39%, left ECA >59%, normal subclavian bilaterally, occluded left vertebral >> FU 2 years   Chronic kidney disease    Chronic systolic CHF (congestive heart failure) ([place])    a. ischemic CM EF 15-20%;  b. s/p AICD 05/24/04;  c. Echo 7/06: EF 30-40%, mild reduced RVSF, d. Echo 12/2015 EF 35-40%   Elevated PSA    History of kidney stones    x 1   HTN (hypertension)    Hyperlipidemia    ICD (implantable cardiac defibrillator) in place 12-25-2012   MDT CRTD upgrade by Dr Lovena Le   Myocardial infarction [facility]) 1990   Pericardial effusion    Echocardiogram (09/2014): EF 25% with distal anterior, distal inferior, distal lateral and apical akinesis, grade 1 diastolic dysfunction, very mild aortic stenosis (mean 7 mmHg) -  this may be depressed due to low EF (2-D images suggest mild to moderate aortic stenosis), large pericardial effusion, no RA collapse   Pneumonia    Presence of permanent cardiac pacemaker    Medtronic   PVD (peripheral vascular disease) ([place])    s/p L carotid PTCA/stent 2004   Transient ischemic attack    Type II diabetes mellitus ([place])    type II   Patient Active Problem List   Diagnosis Date Noted   VT (ventricular tachycardia) ([place]) 03/10/2020   History of recent fall 01/27/2020   Hyperkalemia 12/05/2019   Aspiration of food    Protein-calorie malnutrition, severe 11/22/2019   Dysphagia 11/21/2019   Enlarged prostate    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   Pharyngeal dysphagia 07/10/2019   Dysphonia 07/10/2019   Hypertensive retinopathy of both eyes 02/05/2018   Mild nonproliferative diabetic retinopathy of both eyes without macular edema associated with type 2 diabetes mellitus ([place]) 02/05/2018   Dry eye syndrome of both eyes 06/06/2017   Weight loss 12/11/2016   Advanced care planning/counseling discussion 12/11/2016   Urinary retention 01/13/2016   Healthcare maintenance 11/22/2015   [name] (chronic kidney disease) stage 3, GFR 30-59 ml/min (HCC) 12/04/2014   Automatic implantable cardioverter-defibrillator in situ 01/16/2012   Constipation 03/10/2009   Coronary atherosclerosis 95/62/1308   Chronic systolic congestive heart failure ([place]) 12/25/2007   Hyperlipidemia 01/02/2007   Type 2 diabetes mellitus with peripheral vascular disease ([place]) 01/01/1989   Home Medication(s) Prior to Admission medications  Medication Sig Start Date End Date Taking? Authorizing Provider  aspirin EC 81 MG tablet Take 81 mg by mouth daily. Swallow whole.   Yes [provider]  atorvastatin (LIPITOR) 40 MG tablet TAKE 1 TABLET(40 MG) BY MOUTH DAILY AT 6 PM Patient taking differently: Take 40 mg by mouth daily. 03/03/19  Yes Axel Filler, MD  insulin glargine (LANTUS) 100 UNIT/ML injection Inject 0.08 mLs (8 Units total) into the skin daily. 12/04/19  Yes Jeanmarie Hubert, MD  Amino Acids-Protein Hydrolys (FEEDING SUPPLEMENT, PRO-STAT SUGAR FREE 64,) LIQD Place 30 mLs into feeding tube daily. 11/30/19   Seawell, Jaimie A, DO  Blood Glucose Monitoring Suppl (ACCU-CHEK AVIVA PLUS) w/Device KIT Check finger stick glucose once daily 05/06/18   Axel Filler, MD  carvedilol (COREG) 6.25 MG tablet PLACE 1 TABLET INTO FEEDING TUBE TWICE DAILY WITH A MEAL Patient taking differently: Take 6.25 mg by mouth 2 (two) times daily with a meal.  12/02/19   Axel Filler, MD  doxycycline (MONODOX) 50 MG capsule Take 50 mg by mouth 2 (two) times daily. 09/10/20   [provider]  fluorometholone (FML) 0.1 % ophthalmic suspension Place 2 drops into both eyes 2 (two) times daily.    [provider]  furosemide (LASIX) 40 MG tablet Place 1 tablet (40 mg total) into feeding tube 2 (two) times daily. 12/04/19   Jeanmarie Hubert, MD  glucose blood (ACCU-CHEK AVIVA PLUS) test strip Check blood sugar up to 3 times a day 09/15/19   Axel Filler, MD  hydrocortisone cream 1 % Apply to affected area 2 times daily 02/16/20   Faustino Congress, NP  Hydrocortisone, Perianal, (PROCTO-PAK) 1 % CREA Apply 1 application topically 2 (two) times daily. 07/25/19   Raylene Everts, MD  Insulin Pen Needle (B-D ULTRAFINE III SHORT PEN) 31G X 8 MM MISC USE ONCE DAILY WITH LANTUS PEN 05/06/18   Axel Filler, MD  lidocaine (XYLOCAINE) 2 % solution Patient: Mix 1part 2% viscous lidocaine, 1part H20. Swallow 9m of diluted mixture, 356m before meals and at bedtime, up to QID 10/27/19   SqEppie GibsonMD  Nutritional Supplements (FEEDING SUPPLEMENT, NEPRO CARB STEADY,) LIQD Place 237 mLs into feeding tube 4 (four) times daily. 12/08/19   ChMitzi HansenMD  ofloxacin (OCUFLOX) 0.3 % ophthalmic solution Place 1 drop into  both eyes 4 (four) times daily.  11/06/19   [provider]  ondansetron (ZOFRAN) 4 MG tablet Place 1 tablet (4 mg total) into feeding tube every 6 (six) hours. 02/11/20   Ward, KrDelice BisonDO  pantoprazole (PROTONIX) 40 MG tablet TAKE 1 TABLET(40 MG) BY MOUTH DAILY Patient taking differently: Take 40 mg by mouth daily. 01/19/20   ViAxel FillerMD  polyethylene glycol (MIRALAX / GLYCOLAX) 17 g packet Take 17 g by mouth daily. 02/16/20   MaFaustino CongressNP  senna-docusate (SENOKOT-S) 8.6-50 MG tablet Place 2 tablets into feeding tube daily. 11/30/19   Seawell, Jaimie A, DO  tamsulosin (FLOMAX) 0.4 MG CAPS capsule Take 0.8 mg by mouth at bedtime.     [provider]  trimethoprim-polymyxin b (POLYTRIM) ophthalmic solution Place 1 drop into both eyes 4 (four) times daily. 09/10/20   [provider]  VENTOLIN HFA 108 (90 Base) MCG/ACT inhaler INHALE 2 PUFFS INTO THE LUNGS EVERY 6 HOURS AS NEEDED FOR WHEEZING OR SHORTNESS OF BREATH Patient taking differently: Inhale 2 puffs into the lungs every 6 (six) hours as needed for wheezing or shortness  of breath.  03/15/16   Axel Filler, MD                                                                                                                                    Past Surgical History Past Surgical History:  Procedure Laterality Date   BI-VENTRICULAR IMPLANTABLE CARDIOVERTER DEFIBRILLATOR UPGRADE N/A 12/25/2012   Procedure: BI-VENTRICULAR IMPLANTABLE CARDIOVERTER DEFIBRILLATOR UPGRADE;  Surgeon: Evans Lance, MD;  Location: [facility] CATH LAB;  Service: Cardiovascular;  Laterality: N/A;   BIV ICD UPGRADE  12/25/2012   MDT CRTD upgrade by Dr Lovena Le for ischemic cardiomyopathy and worsening conduction system disease   CARDIAC CATHETERIZATION  06/2003,  01/2004   CARDIAC CATHETERIZATION N/A 01/13/2016   Procedure: Left Heart Cath and Coronary Angiography;  Surgeon: Jettie Booze, MD;  Location: [facility] CV LAB;   Service: Cardiovascular;  Laterality: N/A;   CARDIAC DEFIBRILLATOR PLACEMENT  05/24/2004   Implantation of a MDT single-chamber defibrillator   CAROTID STENT  09/11/2003   Percutaneous transluminal angioplasty and stent placement of the left internal carotid artery.   CATARACT EXTRACTION W/ INTRAOCULAR LENS  IMPLANT, BILATERAL Bilateral ~ 2010   [place]   "2" (12/25/2012)   CYSTOSCOPY     DIRECT LARYNGOSCOPY N/A 08/15/2019   Procedure: MICRODIRECT LARYNGOSCOPY WITH BIOPSY;  Surgeon: Melida Quitter, MD;  Location: [facility];  Service: ENT;  Laterality: N/A;   ESOPHAGOGASTRODUODENOSCOPY (EGD) WITH PROPOFOL N/A 11/22/2019   Procedure: ESOPHAGOGASTRODUODENOSCOPY (EGD) WITH PROPOFOL with possible dilation;  Surgeon: Clarene Essex, MD;  Location: [facility];  Service: Endoscopy;  Laterality: N/A;   INSERT / REPLACE / REMOVE PACEMAKER     IR GASTROSTOMY TUBE MOD SED  11/27/2019   LEAD REVISION N/A 12/25/2012   Procedure: LEAD REVISION;  Surgeon: Evans Lance, MD;  Location: [facility] CATH LAB;  Service: Cardiovascular;  Laterality: N/A;   RIGID ESOPHAGOSCOPY N/A 08/15/2019   Procedure: RIGID ESOPHAGOSCOPY;  Surgeon: Melida Quitter, MD;  Location: [facility] OR;  Service: ENT;  Laterality: N/A;   Family History Family History  Problem Relation Age of Onset   Diabetes Mother    Healthy Father    Diabetes Brother    Heart attack Neg Hx    Stroke Neg Hx     Social History Social History   Tobacco Use   Smoking status: Former Smoker    Types: Cigarettes    Quit date: 12/27/1967    Years since quitting: 52.7   Smokeless tobacco: Never Used  Vaping Use   Vaping Use: Never used  Substance Use Topics   Alcohol use: No    Alcohol/week: 0.0 standard drinks    Comment: 12/25/2012 "quit all alcohol 60 yr ago"   Drug use: No   Allergies Patient has no known allergies.  Review of Systems Review of Systems All other systems are reviewed and are negative for  acute change except  as noted in the HPI  Physical Exam Vital Signs  I have reviewed the triage vital signs BP 115/64    Pulse 81    Temp 97.9 F (36.6 C) (Oral)    Resp 18    SpO2 100%   Physical Exam Vitals reviewed.  Constitutional:      General: He is not in acute distress.    Appearance: He is well-developed and well-nourished. He is not diaphoretic.  HENT:     Head: Normocephalic and atraumatic.     Nose: Nose normal.  Eyes:     General: No scleral icterus.       Right eye: No discharge.        Left eye: No discharge.     Extraocular Movements: EOM normal.     Conjunctiva/sclera: Conjunctivae normal.     Pupils: Pupils are equal, round, and reactive to light.  Cardiovascular:     Rate and Rhythm: Normal rate and regular rhythm.     Heart sounds: No murmur heard. No friction rub. No gallop.   Pulmonary:     Effort: Pulmonary effort is normal. No respiratory distress.     Breath sounds: Normal breath sounds. No stridor. No rales.  Chest:     Chest wall: No tenderness.    Abdominal:     General: There is no distension.     Palpations: Abdomen is soft.     Tenderness: There is no abdominal tenderness.    Musculoskeletal:        General: No tenderness.     Cervical back: Normal range of motion and neck supple.     Right lower leg: 1+ Pitting Edema present.     Left lower leg: 1+ Pitting Edema present.     Comments: Chronic skin changes from edema. Healing sores.   Skin:    General: Skin is warm and dry.     Findings: No erythema or rash.  Neurological:     Mental Status: He is alert and oriented to person, place, and time.  Psychiatric:        Mood and Affect: Mood and affect normal.     ED Results and Treatments Labs (all labs ordered are listed, but only abnormal results are displayed) Labs Reviewed  LIPASE, BLOOD - Abnormal; Notable for the following components:      Result Value   Lipase 71 (*)    All other components within normal limits   COMPREHENSIVE METABOLIC PANEL - Abnormal; Notable for the following components:   Sodium 114 (*)    Chloride 75 (*)    Glucose, Bld 234 (*)    BUN 108 (*)    Creatinine, Ser 1.80 (*)    Albumin 3.3 (*)    AST 48 (*)    Alkaline Phosphatase 176 (*)    GFR, Estimated 36 (*)    All other components within normal limits  CBC - Abnormal; Notable for the following components:   WBC 11.1 (*)    RBC 3.52 (*)    Hemoglobin 11.1 (*)    HCT 31.3 (*)    All other components within normal limits  BRAIN NATRIURETIC PEPTIDE - Abnormal; Notable for the following components:   B Natriuretic Peptide 413.3 (*)    All other components within normal limits  I-STAT CHEM 8, ED - Abnormal; Notable for the following components:   Sodium 114 (*)    Chloride 74 (*)    BUN 126 (*)    Creatinine, Ser 1.80 (*)  Glucose, Bld 200 (*)    TCO2 34 (*)    All other components within normal limits  TROPONIN I (HIGH SENSITIVITY) - Abnormal; Notable for the following components:   Troponin I (High Sensitivity) 57 (*)    All other components within normal limits  RESP PANEL BY RT-PCR (FLU A&B, COVID) [id number]  URINALYSIS, ROUTINE W REFLEX MICROSCOPIC  TROPONIN I (HIGH SENSITIVITY)                                                                                                                         EKG  EKG Interpretation  Date/Time:  Tuesday September 21 2020 23:59:06 EST Ventricular Rate:  88 PR Interval:  148 QRS Duration: 172 QT Interval:  470 QTC Calculation: 568 R Axis:   -98 Text Interpretation: Atrial-sensed ventricular-paced rhythm Abnormal ECG When compared with ECG of 02/11/2020, No significant change was found Reconfirmed by Addison Lank [phone]) on 09/22/2020 2:28:17 AM      Radiology DG ABD ACUTE 2+V W 1V CHEST  Result Date: 09/22/2020 CLINICAL DATA:  Vomiting EXAM: DG ABDOMEN ACUTE WITH 1 VIEW CHEST COMPARISON:  None. FINDINGS: There is left retrocardiac consolidation and, likely, a  small pleural effusion. Right lung is clear. There is no free intraperitoneal air. There is a percutaneous gastrostomy tube projects over the stomach. No dilated small bowel. IMPRESSION: 1. Left retrocardiac consolidation and small pleural effusion. This could indicate pneumonia. 2. No free intraperitoneal air or evidence of small bowel obstruction. Electronically Signed   By: Ulyses Jarred M.D.   On: 09/22/2020 03:51   US Abdomen Limited RUQ (LIVER/GB)  Result Date: 09/22/2020 CLINICAL DATA:  86 year old male with vomiting. EXAM: ULTRASOUND ABDOMEN LIMITED RIGHT UPPER QUADRANT COMPARISON:  CT Abdomen and Pelvis 02/11/2020. FINDINGS: Gallbladder: Chronic cholelithiasis. Stones individually estimated up to 17 mm. Partially contracted gallbladder. Gallbladder wall thickness at the upper limits of normal. No pericholecystic fluid. No sonographic Murphy sign elicited. Common bile duct: Diameter: 5-6 mm, normal. Liver: No focal lesion identified. Within normal limits in parenchymal echogenicity. Portal vein is patent on color Doppler imaging with normal direction of blood flow towards the liver. Other: Negative visible right kidney, pancreas. IMPRESSION: Chronic cholelithiasis. No strong evidence of acute cholecystitis or bile duct obstruction. Electronically Signed   By: Genevie Ann M.D.   On: 09/22/2020 04:24    Pertinent labs & imaging results that were available during my care of the patient were reviewed by me and considered in my medical decision making (see chart for details).  Medications Ordered in ED Medications - No data to display  Procedures .1-3 Lead EKG Interpretation Performed by: Fatima Blank, MD Authorized by: Fatima Blank, MD     Interpretation: normal     ECG rate:  89   ECG rate assessment: normal     Rhythm: sinus rhythm     Ectopy: none      Conduction: normal   .Critical Care Performed by: Fatima Blank, MD Authorized by: Fatima Blank, MD   Critical care provider statement:    Critical care time (minutes):  45   Critical care was time spent personally by me on the following activities:  Discussions with consultants, evaluation of patient's response to treatment, examination of patient, ordering and performing treatments and interventions, ordering and review of laboratory studies, ordering and review of radiographic studies, pulse oximetry, re-evaluation of patient's condition, obtaining history from patient or surrogate and review of old charts    (including critical care time)  Medical Decision Making / ED Course I have reviewed the nursing notes for this encounter and the patient's prior records (if available in EHR or on provided paperwork).   SASUKE YAFFE was evaluated in Emergency Department on 09/22/2020 for the symptoms described in the history of present illness. He was evaluated in the context of the global COVID-19 pandemic, which necessitated consideration that the patient might be at risk for infection with the SARS-CoV-2 virus that causes COVID-19. Institutional protocols and algorithms that pertain to the evaluation of patients at risk for COVID-19 are in a state of rapid change based on information released by regulatory bodies including the CDC and federal and state organizations. These policies and algorithms were followed during the patient's care in the ED.  Work up notable for significant hyponatremia. Given h/o likely dilutional from volume overload. Will add BNP.  Mildly elevated LFTs and lipase abd benign.  On review of records, recent CT of the abdomen in 2020 revealed evidence of cholelithiasis. We will obtain right upper quadrant ultrasound to assess for possible biliary obstruction. Given his benign abdomen I have low suspicion for acute cholecystitis. Doubt SBO, but will  obtain acute abd series.  Plain film not concerning for SBO.  Right upper quadrant notable for cholelithiasis without strong evidence of acute cholecystitis or choledocholithiasis.  Will require admission for further work up and management of his hyponatremia.   Clinical Course as of 09/22/20 0646  Wed Sep 22, 2020  0615 Spoke with Internal medicine team who will admit the patient. [PC]    Clinical Course User Index [PC] Karsen Nakanishi, Grayce Sessions, MD     Final Clinical Impression(s) / ED Diagnoses Final diagnoses:  Emesis  Hyponatremia      This chart was dictated using voice recognition software.  Despite best efforts to proofread,  errors can occur which can change the documentation meaning.       Fatima Blank, MD 09/22/20 [phone]

## 2020-09-22 NOTE — ED Notes (Signed)
Date and time results received: 09/22/20 1419 (use smartphrase ".now" to insert current time)  Test: sodium 119 Critical Value: 119  Name of Provider Notified: raines  Orders Received? Or Actions Taken?:

## 2020-09-22 NOTE — H&P (Signed)
NAME:  [name], MRN:  [id number], DOB:  Dec 03, 1930, LOS: 0 ADMISSION DATE:  09/21/2020, Primary: Axel Filler, MD  CHIEF COMPLAINT:  Nausea and nose bleed   Medical Service: Internal Medicine Teaching Service         Attending Physician: Dr. Oda Kilts, MD    First Contact: Dr. Lisabeth Devoid Pager: [phone]   Second Contact: Dr. Marianna Payment Pager: [phone]        After Hours (After 5p/  First Contact Pager: [phone]  weekends / holidays): Second Contact Pager: [phone]   HISTORY OF PRESENT ILLNESS  [name] 86yo male with chronic systolic and diastolic heart failure (10/252 EF 27-06%, grade I diastolic dysfunction) 2/2 ischemic cardiomyopathy s/p ICD placement (2005), hypertension, CKDIII, PAD s/p L carotid stent (2004), throat cancer s/p radiation, type II diabetes mellitus presenting to ED with nausea, weakness and a spitting up blood.  [name] mentions that he had some nosebleeds recently that got worse last night also reported to have some nausea and weakness per ED provider. He got radiation therapy for his throat cancer few days ago.  He has been feeling weak for past couple of days. No chest pain.  Denies any other pain.  No shortness of breath.  No abdominal pain.  He lives alone at home but also is a [facility].  He has a feeding tube and states that he take care of it at home. No fever or chills, no cough. No other complaint.  PCP: Axel Filler, MD  ED COURSE  On arrival to ED, patient afebrile, hemodynamically stable, oxygenating well on room air. Labs notable for hyponatremia, hypochloremia, mildly elevated alk phos, leukocytosis, and elevated BNP. Imaging revealed left retrocardiac consolidation and small pleural effusion and chronic cholelithiasis without evidence of acute cholecystitis. IMTS consulted for admission for work-up of hyponatremia.  PAST MEDICAL HISTORY  He,  has a past medical history of Adenomatous colon polyp  (02/14/2012), AICD (automatic cardioverter/defibrillator) present, BBB (bundle branch block), Carotid stenosis, Chronic kidney disease, Chronic systolic CHF (congestive heart failure) ([place]), Elevated PSA, History of kidney stones, HTN (hypertension), Hyperlipidemia, Hyponatremia (09/22/2020), ICD (implantable cardiac defibrillator) in place (12-25-2012), Myocardial infarction ([place]) (1990), Pericardial effusion, Pneumonia, Presence of permanent cardiac pacemaker, PVD (peripheral vascular disease) ([place]), Transient ischemic attack, and Type II diabetes mellitus ([place]).   HOME MEDICATIONS   Prior to Admission medications   Medication Sig Start Date End Date Taking? Authorizing Provider  aspirin EC 81 MG tablet Take 81 mg by mouth daily. Swallow whole.   Yes [provider]  atorvastatin (LIPITOR) 40 MG tablet TAKE 1 TABLET(40 MG) BY MOUTH DAILY AT 6 PM Patient taking differently: Take 40 mg by mouth daily. 03/03/19  Yes Axel Filler, MD  insulin glargine (LANTUS) 100 UNIT/ML injection Inject 0.08 mLs (8 Units total) into the skin daily. 12/04/19  Yes Jeanmarie Hubert, MD  Amino Acids-Protein Hydrolys (FEEDING SUPPLEMENT, PRO-STAT SUGAR FREE 64,) LIQD Place 30 mLs into feeding tube daily. 11/30/19   Seawell, Jaimie A, DO  Blood Glucose Monitoring Suppl (ACCU-CHEK AVIVA PLUS) w/Device KIT Check finger stick glucose once daily 05/06/18   Axel Filler, MD  carvedilol (COREG) 6.25 MG tablet PLACE 1 TABLET INTO FEEDING TUBE TWICE DAILY WITH A MEAL Patient taking differently: Take 6.25 mg by mouth 2 (two) times daily with a meal.  12/02/19   Axel Filler, MD  doxycycline (MONODOX) 50 MG capsule Take 50 mg by mouth 2 (two) times  daily. 09/10/20   [provider]  fluorometholone (FML) 0.1 % ophthalmic suspension Place 2 drops into both eyes 2 (two) times daily.    [provider]  furosemide (LASIX) 40 MG tablet Place 1 tablet (40 mg total) into feeding tube 2 (two)  times daily. 12/04/19   Jeanmarie Hubert, MD  glucose blood (ACCU-CHEK AVIVA PLUS) test strip Check blood sugar up to 3 times a day 09/15/19   Axel Filler, MD  hydrocortisone cream 1 % Apply to affected area 2 times daily 02/16/20   Faustino Congress, NP  Hydrocortisone, Perianal, (PROCTO-PAK) 1 % CREA Apply 1 application topically 2 (two) times daily. 07/25/19   Raylene Everts, MD  Insulin Pen Needle (B-D ULTRAFINE III SHORT PEN) 31G X 8 MM MISC USE ONCE DAILY WITH LANTUS PEN 05/06/18   Axel Filler, MD  lidocaine (XYLOCAINE) 2 % solution Patient: Mix 1part 2% viscous lidocaine, 1part H20. Swallow 30mL of diluted mixture, 28min before meals and at bedtime, up to QID 10/27/19   Eppie Gibson, MD  Nutritional Supplements (FEEDING SUPPLEMENT, NEPRO CARB STEADY,) LIQD Place 237 mLs into feeding tube 4 (four) times daily. 12/08/19   Mitzi Hansen, MD  ofloxacin (OCUFLOX) 0.3 % ophthalmic solution Place 1 drop into both eyes 4 (four) times daily.  11/06/19   [provider]  ondansetron (ZOFRAN) 4 MG tablet Place 1 tablet (4 mg total) into feeding tube every 6 (six) hours. 02/11/20   Ward, Delice Bison, DO  pantoprazole (PROTONIX) 40 MG tablet TAKE 1 TABLET(40 MG) BY MOUTH DAILY Patient taking differently: Take 40 mg by mouth daily. 01/19/20   Axel Filler, MD  polyethylene glycol (MIRALAX / GLYCOLAX) 17 g packet Take 17 g by mouth daily. 02/16/20   Faustino Congress, NP  senna-docusate (SENOKOT-S) 8.6-50 MG tablet Place 2 tablets into feeding tube daily. 11/30/19   Seawell, Jaimie A, DO  tamsulosin (FLOMAX) 0.4 MG CAPS capsule Take 0.8 mg by mouth at bedtime.     [provider]  trimethoprim-polymyxin b (POLYTRIM) ophthalmic solution Place 1 drop into both eyes 4 (four) times daily. 09/10/20   [provider]  VENTOLIN HFA 108 (90 Base) MCG/ACT inhaler INHALE 2 PUFFS INTO THE LUNGS EVERY 6 HOURS AS NEEDED FOR WHEEZING OR SHORTNESS OF BREATH Patient  taking differently: Inhale 2 puffs into the lungs every 6 (six) hours as needed for wheezing or shortness of breath.  03/15/16   Axel Filler, MD    ALLERGIES   Allergies as of 09/21/2020  . (No Known Allergies)   SOCIAL HISTORY   Social History   Socioeconomic History  . Marital status: Widowed    Spouse name: Not on file  . Number of children: Not on file  . Years of education: 68  . Highest education level: Not on file  Occupational History  . Occupation: retired  Tobacco Use  . Smoking status: Former Smoker    Types: Cigarettes    Quit date: 12/27/1967    Years since quitting: 52.7  . Smokeless tobacco: Never Used  Vaping Use  . Vaping Use: Never used  Substance and Sexual Activity  . Alcohol use: No    Alcohol/week: 0.0 standard drinks    Comment: 12/25/2012 "quit all alcohol 60 yr ago"  . Drug use: No  . Sexual activity: Not on file  Other Topics Concern  . Not on file  Social History Narrative   ** Merged History Encounter **  Single, 2 adult children, daughter in [place], son in [place] Strain: Not on file  Food Insecurity: Not on file  Transportation Needs: Not on file  Physical Activity: Not on file  Stress: Not on file  Social Connections: Not on file  Intimate Partner Violence: Not on file    FAMILY HISTORY  His family history includes Diabetes in his brother and mother; Healthy in his father. There is no history of Heart attack or Stroke.   REVIEW OF SYSTEMS  ROS per history of present illness.  PHYSICAL EXAMINATION  Blood pressure (!) 113/59, pulse 94, temperature 98.3 F (36.8 C), temperature source Oral, resp. rate 17, height $RemoveBe'5\' 11"'INnQjRSIp$  (1.803 m), weight 64.9 kg, SpO2 99 %.    Filed Weights   09/22/20 1516  Weight: 64.9 kg    Physical Exam Constitutional:      General: He is not in acute distress. HENT:     Nose:     Comments: Dried blood on rt nostril Pulmonary:      Effort: Pulmonary effort is normal.     Breath sounds: Rhonchi and rales present. No wheezing.  Abdominal:     Palpations: Abdomen is soft.     Tenderness: There is no abdominal tenderness.     Comments: Feeding tube in place  Musculoskeletal:     Right lower leg: Edema present.     Left lower leg: Edema present.     Comments: Wound   Neurological:     Mental Status: He is alert and oriented to person, place, and time.  Psychiatric:        Mood and Affect: Mood normal.        Behavior: Behavior normal.       SIGNIFICANT DIAGNOSTIC TESTS  ECG: Paced. Chronic ST-T changes  I personally reviewed patient's ECG with my interpretation as above.  CXR: Cardiomegaly vs left lower lobe opacity and pleural effusion  I personally reviewed patient's ECG with my interpretation as above.  LABS   CBC Latest Ref Rng & Units 09/22/2020 09/22/2020 02/10/2020  WBC 4.0 - 10.5 K/uL - 11.1(H) 8.9  Hemoglobin 13.0 - 17.0 g/dL 13.3 11.1(L) 14.0  Hematocrit 39.0 - 52.0 % 39.0 31.3(L) 45.2  Platelets 150 - 400 K/uL - 170 152   BMP Latest Ref Rng & Units 09/22/2020 09/22/2020 09/22/2020  Glucose 70 - 99 mg/dL 144(H) 172(H) 200(H)  BUN 8 - 23 mg/dL 104(H) 106(H) 126(H)  Creatinine 0.61 - 1.24 mg/dL 1.74(H) 1.76(H) 1.80(H)  BUN/Creat Ratio 10 - 24 - - -  Sodium 135 - 145 mmol/L 119(LL) 116(LL) 114(LL)  Potassium 3.5 - 5.1 mmol/L 3.8 3.8 4.4  Chloride 98 - 111 mmol/L 79(L) 77(L) 74(L)  CO2 22 - 32 mmol/L 30 30 -  Calcium 8.9 - 10.3 mg/dL 9.6 9.4 -   CONSULTS  none  ASSESSMENT  [name] is 86yo male with chronic systolic and diastolic heart failure (11/6627 EF 47-65%, grade I diastolic dysfunction) 2/2 ischemic cardiomyopathy s/p ICD placement (2005), hypertension, CKDIII, PAD s/p L carotid stent (2004), throat cancer s/p radiation, type II diabetes mellitus admitted 12/29 for evaluation and work-up of hyponatremia.  PLAN  Active Problems:   Hyponatremia  #Hyponatremia Initial Na  114. Reports some nausea and weakness. No seizure. Likely not an acute hyponatremia.  Pt with volume overload due to CHF exacerbation +/- SIADH in setting of cancer. His Na level improved to 119 with lasix. He  also has elevated BUN (unproportion to his Cr), and nl to elevated serum osm that can explain a component of pseudo hyponatremia rather than volume overload as the only cause.  His elevated Bun can be due to nose bleeding and probable digestion of blood.  -BMP q 4h -No more lasix today. Na at the goal of ~120  for today. No more diuresis   #Chronic systolic, diastolic heart failure, s/p ICD BNP elevated on ~400 on  Arrival. S/p IV lasix 60 mg IV -Reassess volume status tomorrow for diuretic requirement -I and Os -Weight daily -BMP daily -Coreg  #Epistaxis: No active bleeding now. Hb stable. Pt has throat cancer . Last radiation 12/21. Noobvioussign of tumor in the nasopharynx, pharynx, or larynx per oncology outpt note from 12/21.  -Holding ASA today. - If worsens, consider ENT consult.  -PT/PTT/INR  #CKDIII  #Hypertension  -Continue Coreg    #Type II diabetes mellitus -SSI -Lantus 5 u at bed time -CBG monitoring   BEST PRACTICE  DIET: NPO, and  fluid restriction IV fluid: None VTE ppx: Scds Code status: Full   DISPO: Admit patient to Inpatient with expected length of stay greater than 2 midnights.  Dr. Myrtie Hawk, MD Internal Medicine Resident PGY-3 PAGER: [phone] 09/22/20 5:07 PM  If after hours (below), please contact on-call pager: [phone] 5PM-7AM Monday-Friday 1PM-7AM Saturday-Sunday

## 2020-09-22 NOTE — ED Notes (Signed)
Date and time results received: 09/22/20 1156 (use smartphrase ".now" to insert current time)  Test: SODIUM Critical Value: 116  Name of Provider Notified: Dr. Myrtie Hawk  Orders Received? Waiting response Or Actions Taken?: Actions Taken: waiting

## 2020-09-22 NOTE — ED Notes (Signed)
Attempted report x1. Per floor the nurses is capped on pts. They will discuss with their charge and call back.

## 2020-09-23 DIAGNOSIS — I10 Essential (primary) hypertension: Secondary | ICD-10-CM | POA: Diagnosis not present

## 2020-09-23 DIAGNOSIS — E871 Hypo-osmolality and hyponatremia: Secondary | ICD-10-CM | POA: Diagnosis not present

## 2020-09-23 DIAGNOSIS — I5042 Chronic combined systolic (congestive) and diastolic (congestive) heart failure: Secondary | ICD-10-CM

## 2020-09-23 LAB — CBC
HCT: 33.2 % — ABNORMAL LOW (ref 39.0–52.0)
Hemoglobin: 11.2 g/dL — ABNORMAL LOW (ref 13.0–17.0)
MCH: 30.5 pg (ref 26.0–34.0)
MCHC: 33.7 g/dL (ref 30.0–36.0)
MCV: 90.5 fL (ref 80.0–100.0)
Platelets: 173 10*3/uL (ref 150–400)
RBC: 3.67 MIL/uL — ABNORMAL LOW (ref 4.22–5.81)
RDW: 13.2 % (ref 11.5–15.5)
WBC: 10.6 10*3/uL — ABNORMAL HIGH (ref 4.0–10.5)
nRBC: 0 % (ref 0.0–0.2)

## 2020-09-23 LAB — COMPREHENSIVE METABOLIC PANEL
ALT: 33 U/L (ref 0–44)
AST: 52 U/L — ABNORMAL HIGH (ref 15–41)
Albumin: 2.7 g/dL — ABNORMAL LOW (ref 3.5–5.0)
Alkaline Phosphatase: 75 U/L (ref 38–126)
Anion gap: 9 (ref 5–15)
BUN: 104 mg/dL — ABNORMAL HIGH (ref 8–23)
CO2: 31 mmol/L (ref 22–32)
Calcium: 9.3 mg/dL (ref 8.9–10.3)
Chloride: 84 mmol/L — ABNORMAL LOW (ref 98–111)
Creatinine, Ser: 1.88 mg/dL — ABNORMAL HIGH (ref 0.61–1.24)
GFR, Estimated: 34 mL/min — ABNORMAL LOW (ref >60)
Glucose, Bld: 102 mg/dL — ABNORMAL HIGH (ref 70–99)
Potassium: 3.9 mmol/L (ref 3.5–5.1)
Sodium: 124 mmol/L — ABNORMAL LOW (ref 135–145)
Total Bilirubin: 0.3 mg/dL (ref 0.3–1.2)
Total Protein: 5.9 g/dL — ABNORMAL LOW (ref 6.5–8.1)

## 2020-09-23 LAB — BASIC METABOLIC PANEL
Anion gap: 14 (ref 5–15)
BUN: 100 mg/dL — ABNORMAL HIGH (ref 8–23)
CO2: 27 mmol/L (ref 22–32)
Calcium: 9.6 mg/dL (ref 8.9–10.3)
Chloride: 85 mmol/L — ABNORMAL LOW (ref 98–111)
Creatinine, Ser: 1.88 mg/dL — ABNORMAL HIGH (ref 0.61–1.24)
GFR, Estimated: 34 mL/min — ABNORMAL LOW (ref >60)
Glucose, Bld: 102 mg/dL — ABNORMAL HIGH (ref 70–99)
Potassium: 4 mmol/L (ref 3.5–5.1)
Sodium: 126 mmol/L — ABNORMAL LOW (ref 135–145)

## 2020-09-23 LAB — GLUCOSE, CAPILLARY
Glucose-Capillary: 109 mg/dL — ABNORMAL HIGH (ref 70–99)
Glucose-Capillary: 150 mg/dL — ABNORMAL HIGH (ref 70–99)
Glucose-Capillary: 66 mg/dL — ABNORMAL LOW (ref 70–99)
Glucose-Capillary: 73 mg/dL (ref 70–99)
Glucose-Capillary: 98 mg/dL (ref 70–99)

## 2020-09-23 LAB — URINE CULTURE: Culture: NO GROWTH

## 2020-09-23 LAB — HEMOGLOBIN A1C
Hgb A1c MFr Bld: 7 % — ABNORMAL HIGH (ref 4.8–5.6)
Mean Plasma Glucose: 154.2 mg/dL

## 2020-09-23 LAB — PHOSPHORUS: Phosphorus: 4.6 mg/dL (ref 2.5–4.6)

## 2020-09-23 LAB — MAGNESIUM: Magnesium: 2.4 mg/dL (ref 1.7–2.4)

## 2020-09-23 MED ORDER — FUROSEMIDE 40 MG PO TABS
40.0000 mg | ORAL_TABLET | Freq: Two times a day (BID) | ORAL | Status: DC
  Administered 2020-09-23 – 2020-09-25 (×4): 40 mg
  Filled 2020-09-23 (×4): qty 1

## 2020-09-23 MED ORDER — ALUM & MAG HYDROXIDE-SIMETH 200-200-20 MG/5ML PO SUSP
15.0000 mL | Freq: Four times a day (QID) | ORAL | Status: DC | PRN
  Administered 2020-09-23: 22:00:00 15 mL
  Filled 2020-09-23: qty 30

## 2020-09-23 MED ORDER — SODIUM CHLORIDE 0.9% FLUSH
3.0000 mL | Freq: Two times a day (BID) | INTRAVENOUS | Status: DC
  Administered 2020-09-23 – 2020-09-27 (×9): 3 mL via INTRAVENOUS

## 2020-09-23 MED ORDER — FREE WATER
200.0000 mL | Freq: Four times a day (QID) | Status: DC
  Administered 2020-09-23 – 2020-09-28 (×21): 200 mL

## 2020-09-23 MED ORDER — RESOURCE THICKENUP CLEAR PO POWD
Freq: Once | ORAL | Status: AC
  Filled 2020-09-23: qty 125

## 2020-09-23 NOTE — Progress Notes (Signed)
HD#1 Subjective:  Overnight Events: None   Patient states he is hungry this morning as he has not gotten tube feeds while in the hospital. Denies any further bleeding from nose or spitting up any blood, no nausea. Patient notes prior to admission he is getting 3 tube feedings daily in addition to 3-4 large bottles of water. He also drinks a couple bottles of water by mouth as well.  Objective:  Vital signs in last 24 hours: Vitals:   09/22/20 1516 09/22/20 1931 09/23/20 0012 09/23/20 0323  BP: (!) 113/59 (!) 114/59 (!) 105/53 (!) 92/44  Pulse: 94 86 83 81  Resp: 17 16 18 18   Temp: 98.3 F (36.8 C) 97.8 F (36.6 C) 99 F (37.2 C) 98 F (36.7 C)  TempSrc: Oral Oral Oral Oral  SpO2: 99% 100% 100% 100%  Weight: 64.9 kg   61.3 kg  Height: 5\' 11"  (1.803 m)      Supplemental O2: Room Air SpO2: 100 %   Physical Exam:  Physical Exam Constitutional:      Appearance: Normal appearance.  Abdominal:     General: Abdomen is flat. There is no distension.     Palpations: Abdomen is soft.     Comments: Gastric tube in place  Skin:    General: Skin is warm and dry.     Comments: Lower extremity wounds cleaned and dressed  Neurological:     Mental Status: He is alert and oriented to person, place, and time. Mental status is at baseline.     Filed Weights   09/22/20 1516 09/23/20 0323  Weight: 64.9 kg 61.3 kg     Intake/Output Summary (Last 24 hours) at 09/23/2020 0552 Last data filed at 09/23/2020 0323 Gross per 24 hour  Intake 160 ml  Output 1700 ml  Net -1540 ml   Net IO Since Admission: -1,540 mL [09/23/20 0552]  Pertinent Labs: CBC Latest Ref Rng & Units 09/23/2020 09/22/2020 09/22/2020  WBC 4.0 - 10.5 K/uL 10.6(H) - 11.1(H)  Hemoglobin 13.0 - 17.0 g/dL 11.2(L) 13.3 11.1(L)  Hematocrit 39.0 - 52.0 % 33.2(L) 39.0 31.3(L)  Platelets 150 - 400 K/uL 173 - 170    CMP Latest Ref Rng & Units 09/23/2020 09/22/2020 09/22/2020  Glucose 70 - 99 mg/dL 102(H) 154(H)  133(H)  BUN 8 - 23 mg/dL 104(H) 104(H) 105(H)  Creatinine 0.61 - 1.24 mg/dL 1.88(H) 1.80(H) 1.92(H)  Sodium 135 - 145 mmol/L 124(L) 122(L) 121(L)  Potassium 3.5 - 5.1 mmol/L 3.9 3.9 4.1  Chloride 98 - 111 mmol/L 84(L) 82(L) 80(L)  CO2 22 - 32 mmol/L 31 31 29   Calcium 8.9 - 10.3 mg/dL 9.3 9.4 9.7  Total Protein 6.5 - 8.1 g/dL 5.9(L) - -  Total Bilirubin 0.3 - 1.2 mg/dL 0.3 - -  Alkaline Phos 38 - 126 U/L 75 - -  AST 15 - 41 U/L 52(H) - -  ALT 0 - 44 U/L 33 - -   High normal serum Osm 293 Urine osm 203 Sodium ur 30   Imaging: No results found.  Assessment/Plan:   Active Problems:   Hyponatremia  Patient Summary: #Hyponatremia Patient reports large amount of free water intake prior to admission through feeding tube in addition to PO intake. NO longer having symptoms of weakness of nausea. Na improved to 124 this morning. Serum Osm 293, urine osm 203 and urine sodium of 30. Suspect hyponatremia secondary to excess free water intake.  - Restrict free water intake to 1248ml daily -  Restart tube feeds, consult to register dietician  - 200 ml fee water with tube feeds 4 times daily in addition to PO intake - monitor BMP  #Chronic systolic, diastolic heart failure, s/p ICD Appears euvolemic this morning. Will restart home medications.  -Continue coreg, restart home lasix 40 mg daily -I and Os -BMP daily  #Epistaxis: Denies symptoms of further bleeding. Hb stable at 11. PT/PTINR wnl.  -Hold ASA -Monitor CBCs  #[name] IIIB/V #Hypertension Baseline Cr appears to be around 1.6-1.8 and 1.88 today  Bps soft this morning expect this will improve once restarted on tube feeds. -Continue Coreg   #Type II diabetes mellitus CBG of 60 this morning, expect this will improve once tube feeds are restarted -SSI -Lantus 5 u at bed time -CBG monitoring  DIET: restart tube feeds IV fluid: None VTE ppx: Scds Code status: Full  Dispo: Anticipated discharge to Home in 2 days pending  medical management.Iona Beard, MD 09/23/2020, 5:52 AM Pager: [phone]  Please contact the on call pager after 5 pm and on weekends at [phone].

## 2020-09-23 NOTE — Evaluation (Signed)
Physical Therapy Evaluation Patient Details Name: [name] MRN: [id number] DOB: 08-27-1931 Today's Date: 09/23/2020   History of Present Illness  Pt is an 86 year old man admitted on 09/22/20 with nausea, weakness and spitting blood. + hyponatremia. PMH: CHF with EF 25-30%, ICD, HTN, [name] 3, carotid stent, throat ca, DM2, MI, PVD, percardial effusion, TIA.  Clinical Impression  Patient received in bed, agitated that he has not gotten food. He is agreeable to PT assessment. Patient requires supervision for bed mobility. Found to be wet as his condom cath was off. Gown changed. Patient performed sit to stand with min assist to brace walker as he likes to pull up on walker. He is able to ambulate 25 feet with RW and min guard assist. Slight unsteadiness with mobility when not using UE support. Patient will continue to benefit from skilled PT while here to improve strength and functional independence to return to prior functional level.       Follow Up Recommendations Home health PT    Equipment Recommendations  None recommended by PT    Recommendations for Other Services       Precautions / Restrictions Precautions Precautions: Fall Precaution Comments: gtube fed Restrictions Weight Bearing Restrictions: No      Mobility  Bed Mobility Overal bed mobility: Modified Independent             General bed mobility comments: HOB up    Transfers Overall transfer level: Needs assistance Equipment used: Rolling walker (2 wheeled) Transfers: Sit to/from Stand Sit to Stand: Min assist         General transfer comment: min assist to stabilize walker, pt is used to pulling up on his rollator  Ambulation/Gait Ambulation/Gait assistance: Min guard Gait Distance (Feet): 25 Feet Assistive device: Rolling walker (2 wheeled) Gait Pattern/deviations: Step-through pattern;Decreased step length - right;Decreased step length - left;Decreased stride length;Trunk flexed Gait  velocity: decr   General Gait Details: patient is slightly unsteady with ambulation requiring min guard. Had lob standing at sink without UE support.  Stairs            Wheelchair Mobility    Modified Rankin (Stroke Patients Only)       Balance Overall balance assessment: Needs assistance Sitting-balance support: Feet supported Sitting balance-Leahy Scale: Good     Standing balance support: Bilateral upper extremity supported;During functional activity Standing balance-Leahy Scale: Fair Standing balance comment: min assist for static standing at sink without UE support. With RW, balance is fair to good.                             Pertinent Vitals/Pain Pain Assessment: Faces Faces Pain Scale: Hurts little more Pain Location: buttocks Pain Descriptors / Indicators: Sore Pain Intervention(s): Repositioned;Monitored during session    Home Living Family/patient expects to be discharged to:: Private residence Living Arrangements: Alone Available Help at Discharge: Family;Available PRN/intermittently;Personal care attendant Type of Home: Apartment Home Access: Level entry     Home Layout: One level Home Equipment: Walker - 4 wheels;Cane - quad;Bedside commode;Shower seat;Cane - single point;Adaptive equipment Additional Comments: uses sock aide    Prior Function Level of Independence: Needs assistance   Gait / Transfers Assistance Needed: walks with a rollator  ADL's / Homemaking Assistance Needed: son sponge bathes him, has an aide to help with IADL 3 hours a day except when he [facility] Wednesdays, dresses himself with use of AE  Hand Dominance   Dominant Hand: Right    Extremity/Trunk Assessment   Upper Extremity Assessment Upper Extremity Assessment: Defer to OT evaluation    Lower Extremity Assessment Lower Extremity Assessment: Generalized weakness    Cervical / Trunk Assessment Cervical / Trunk Assessment:  Kyphotic  Communication   Communication: HOH  Cognition Arousal/Alertness: Awake/alert Behavior During Therapy: WFL for tasks assessed/performed Overall Cognitive Status: Difficult to assess                                 General Comments: pt manages his own tube feedings      General Comments      Exercises     Assessment/Plan    PT Assessment Patient needs continued PT services  PT Problem List Decreased strength;Decreased mobility;Decreased activity tolerance;Decreased balance;Decreased safety awareness;Decreased skin integrity       PT Treatment Interventions DME instruction;Therapeutic activities;Gait training;Therapeutic exercise;Patient/family education;Functional mobility training;Balance training    PT Goals (Current goals can be found in the Care Plan section)  Acute Rehab PT Goals Patient Stated Goal: to get some food. PT Goal Formulation: With patient Time For Goal Achievement: 10/01/20 Potential to Achieve Goals: Good    Frequency Min 3X/week   Barriers to discharge Decreased caregiver support      Co-evaluation               AM-PAC PT "6 Clicks" Mobility  Outcome Measure Help needed turning from your back to your side while in a flat bed without using bedrails?: None Help needed moving from lying on your back to sitting on the side of a flat bed without using bedrails?: A Little Help needed moving to and from a bed to a chair (including a wheelchair)?: A Little Help needed standing up from a chair using your arms (e.g., wheelchair or bedside chair)?: A Little Help needed to walk in hospital room?: A Little Help needed climbing 3-5 steps with a railing? : A Lot 6 Click Score: 18    End of Session Equipment Utilized During Treatment: Gait belt Activity Tolerance: Patient tolerated treatment well Patient left: in chair;with call bell/phone within reach;Other (comment) Nurse Communication: Mobility status;Other (comment) (requires  condom cath to be replaced.) PT Visit Diagnosis: Unsteadiness on feet (R26.81);Muscle weakness (generalized) (M62.81);Difficulty in walking, not elsewhere classified (R26.2);History of falling (Z91.81)    Time: 1130-1158 PT Time Calculation (min) (ACUTE ONLY): 28 min   Charges:   PT Evaluation $PT Eval Moderate Complexity: 1 Mod          Envy Meno, PT, GCS 09/23/20,12:35 PM

## 2020-09-23 NOTE — Evaluation (Signed)
Clinical/Bedside Swallow Evaluation Patient Details  Name: [name] MRN: [id number] Date of Birth: 1930/12/10  Today's Date: 09/23/2020 Time: SLP Start Time (ACUTE ONLY): 1210 SLP Stop Time (ACUTE ONLY): 1240 SLP Time Calculation (min) (ACUTE ONLY): 30 min  Past Medical History:  Past Medical History:  Diagnosis Date  . Adenomatous colon polyp 02/14/2012  . AICD (automatic cardioverter/defibrillator) present    Medtronic   . BBB (bundle branch block)    right  . Carotid stenosis    a. s/p L carotid stent 2004;  b. Carotid US (09/2014): Bilateral ICA 1-39%, left ECA >59%, normal subclavian bilaterally, occluded left vertebral >> FU 2 years  . Chronic kidney disease   . Chronic systolic CHF (congestive heart failure) (HCC)    a. ischemic CM EF 15-20%;  b. s/p AICD 05/24/04;  c. Echo 7/06: EF 30-40%, mild reduced RVSF, d. Echo 12/2015 EF 35-40%  . Elevated PSA   . History of kidney stones    x 1  . HTN (hypertension)   . Hyperlipidemia   . Hyponatremia 09/22/2020  . ICD (implantable cardiac defibrillator) in place 12-25-2012   MDT CRTD upgrade by Dr Lovena Le  . Myocardial infarction ([place]) 1990  . Pericardial effusion    Echocardiogram (09/2014): EF 25% with distal anterior, distal inferior, distal lateral and apical akinesis, grade 1 diastolic dysfunction, very mild aortic stenosis (mean 7 mmHg) - this may be depressed due to low EF (2-D images suggest mild to moderate aortic stenosis), large pericardial effusion, no RA collapse  . Pneumonia   . Presence of permanent cardiac pacemaker    Medtronic  . PVD (peripheral vascular disease) ([place])    s/p L carotid PTCA/stent 2004  . Transient ischemic attack   . Type II diabetes mellitus ([place])    type II   Past Surgical History:  Past Surgical History:  Procedure Laterality Date  . BI-VENTRICULAR IMPLANTABLE CARDIOVERTER DEFIBRILLATOR UPGRADE N/A 12/25/2012   Procedure: BI-VENTRICULAR IMPLANTABLE CARDIOVERTER DEFIBRILLATOR UPGRADE;   Surgeon: Evans Lance, MD;  Location: [facility] CATH LAB;  Service: Cardiovascular;  Laterality: N/A;  . BIV ICD UPGRADE  12/25/2012   MDT CRTD upgrade by Dr Lovena Le for ischemic cardiomyopathy and worsening conduction system disease  . CARDIAC CATHETERIZATION  06/2003,  01/2004  . CARDIAC CATHETERIZATION N/A 01/13/2016   Procedure: Left Heart Cath and Coronary Angiography;  Surgeon: Jettie Booze, MD;  Location: [facility] CV LAB;  Service: Cardiovascular;  Laterality: N/A;  . CARDIAC DEFIBRILLATOR PLACEMENT  05/24/2004   Implantation of a MDT single-chamber defibrillator  . CAROTID STENT  09/11/2003   Percutaneous transluminal angioplasty and stent placement of the left internal carotid artery.  Marland Kitchen CATARACT EXTRACTION W/ INTRAOCULAR LENS  IMPLANT, BILATERAL Bilateral ~ 2010  . CORONARY ANGIOPLASTY WITH STENT PLACEMENT  1990   "2" (12/25/2012)  . CYSTOSCOPY    . DIRECT LARYNGOSCOPY N/A 08/15/2019   Procedure: MICRODIRECT LARYNGOSCOPY WITH BIOPSY;  Surgeon: Melida Quitter, MD;  Location: [facility];  Service: ENT;  Laterality: N/A;  . ESOPHAGOGASTRODUODENOSCOPY (EGD) WITH PROPOFOL N/A 11/22/2019   Procedure: ESOPHAGOGASTRODUODENOSCOPY (EGD) WITH PROPOFOL with possible dilation;  Surgeon: Clarene Essex, MD;  Location: [facility];  Service: Endoscopy;  Laterality: N/A;  . INSERT / REPLACE / REMOVE PACEMAKER    . IR GASTROSTOMY TUBE MOD SED  11/27/2019  . LEAD REVISION N/A 12/25/2012   Procedure: LEAD REVISION;  Surgeon: Evans Lance, MD;  Location: [facility] CATH LAB;  Service: Cardiovascular;  Laterality: N/A;  . RIGID ESOPHAGOSCOPY  N/A 08/15/2019   Procedure: RIGID ESOPHAGOSCOPY;  Surgeon: Melida Quitter, MD;  Location: [facility];  Service: ENT;  Laterality: N/A;   HPI:  [name] is a 86 y.o. 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 Pt has a complicated hx of post-radiation dysphagia. G-tube 11/27/19. He has had three MBS studies this year: 3/1, 3/15, and 02/18/20. All studies revealed moderate-severe vallecular residue due to decreased base of tongue propulsion, decreased pharyngeal constriction and reduced epiglottic deflection. "Though several positional strategies were attempted, as well as a super-supraglottic swallow, the chin tuck to the left continued to be the most effective strategy to transit the maximum amount of bolus and decrease residue. " There was penetration of all consistencies into the larynx.  Honey and pudding thick liquids were recommended at the time of the last MBS.  Pt was followed by OP SLP June 2021 and provided with a home exercise program addressing lingual, vocal, and pharyngeal strengthening. He was instructed to complete this program 2 times a day, until 6 months after his last rad tx, then x2 a week after that. He was most recently seen by Dr. Isidore Moos, radiation oncology, on 09/14/20: "Continue swallowing therapy; he is PEG dependent and reports that he just does not feel ready to try to eat or drink by mouth right now; he was seen by speech-language pathology about a month and a half ago and underwent swallowing study at that time.  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  He was seen by OP SLP in June, who provided him with a home exercise program that can help stave off the effects of fibrosis, however it is unclear after talking to Mr. Hirsch if he has been doing any of the exercises.  At the time of last MBS in May 2021, it was recommended he have honey-thick  liquids to supplement PEG feedings.  However, pt says he has not taken anything by mouth and can't get access to the thickened liquids.  He is an inconsistent historian so it can be difficult to discern the trajectory of his swallowing history.  Today, he consumed teaspoons of honey-thick liquid and presented with signs of dysphagia c/w prior performances: multiple swallows, throat-clearing, eructation. He continues with thick secretions. It is unlikely he will show much improvement, but it is important for him to continue taking at least limited POs by mouth to help stimulate some saliva production and prevent further disuse atrophy.  Since it has been seven months, it may be beneficial to repeat his MBS while admitted so that he can be given up-to-date recommendations for best POs at home. D/W pt and RN.  For now, resume honey-thick liquids. SLP Visit Diagnosis: Dysphagia, pharyngeal phase (R13.13)    Aspiration Risk  Moderate aspiration risk    Diet Recommendation   honey thick liquids  Medication Administration: Via alternative means    Other  Recommendations Oral Care Recommendations: Oral care QID   Follow up Recommendations Other (comment) (tba)  Frequency and Duration min 2x/week  1 week       Prognosis Prognosis for Safe Diet Advancement: Guarded Barriers to Reach Goals: Severity of deficits      Swallow Study   General HPI: [name] is a 86 y.o. 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 Pt has a complicated hx of post-radiation dysphagia. G-tube 11/27/19. He has had three MBS studies this year: 3/1, 3/15, and 02/18/20. All studies revealed moderate-severe vallecular residue due to decreased base of  tongue propulsion, decreased pharyngeal constriction and reduced epiglottic deflection. "Though several positional strategies were attempted, as well as a super-supraglottic swallow, the chin tuck to the left continued to be the most effective strategy to transit the maximum amount of bolus and decrease residue. " There was penetration of all consistencies into the larynx.  Honey and pudding thick liquids were recommended at the time of the last MBS.  Pt was followed by OP SLP June 2021 and provided with a home exercise program addressing lingual, vocal, and pharyngeal strengthening. He was instructed to complete this program 2 times a day, until 6 months after his last rad tx, then x2 a week after that. He was most recently seen by Dr. Isidore Moos, radiation oncology, on 09/14/20: "Continue swallowing therapy; he is PEG dependent and reports that he just does not feel ready to try to eat or drink by mouth right now; he was seen by speech-language pathology about a month and a half ago and underwent swallowing study at that time.  Is not clear if he is getting outpatient therapy any longer and so I will ask our navigator to look into this." Type of Study: Bedside Swallow Evaluation Previous Swallow Assessment: see HPI Diet Prior to this Study: NPO Temperature Spikes Noted: No Respiratory Status: Room air History of Recent Intubation: No Behavior/Cognition: Alert;Cooperative Oral Cavity Assessment: Within Functional Limits Oral Care Completed by SLP: No Oral Cavity - Dentition: Edentulous Vision: Functional for self-feeding Self-Feeding Abilities: Able to feed self Patient Positioning: Upright in chair Baseline Vocal Quality: Hoarse Volitional Cough: Strong Volitional Swallow: Able to elicit    Oral/Motor/Sensory Function Overall Oral Motor/Sensory Function: Within functional limits   Ice Chips Ice chips: Not tested   Thin Liquid Thin Liquid: Not tested    Nectar Thick Nectar Thick Liquid: Not tested    Honey Thick Honey Thick Liquid: Impaired Presentation: Spoon;Self fed Pharyngeal Phase Impairments: Multiple swallows;Throat Clearing - Immediate   Puree Puree: Impaired Presentation: Spoon Pharyngeal Phase Impairments: Multiple swallows;Throat Clearing - Immediate   Solid     Solid: Not tested      [name] 09/23/2020,12:46 PM  Estill Bamberg L. Tivis Ringer, [place] Office number [phone] Pager [phone]

## 2020-09-23 NOTE — Progress Notes (Signed)
Pt has been brought a breakfast tray on heart healthy/ carb-mod diet, however pt states he has severe dysphagia d/t throat cancer and recent radiation treatment. He states he has not been taking food by mouth prior to admission. Breakfast try removed. MD paged to day shift RN's phone.

## 2020-09-23 NOTE — Progress Notes (Signed)
Inpatient Diabetes Program Recommendations  AACE/ADA: New Consensus Statement on Inpatient Glycemic Control (2015)  Target Ranges:  Prepandial:   less than 140 mg/dL      Peak postprandial:   less than 180 mg/dL (1-2 hours)      Critically ill patients:  140 - 180 mg/dL   Lab Results  Component Value Date   GLUCAP 66 (L) 09/23/2020   HGBA1C 7.0 (H) 09/23/2020    Review of Glycemic Control Results for CARON, TARDIF (MRN [id number]) as of 09/23/2020 13:56  Ref. Range 09/22/2020 12:45 09/22/2020 14:11 09/22/2020 16:38 09/22/2020 20:04 09/23/2020 00:11 09/23/2020 03:24 09/23/2020 12:19  Glucose-Capillary Latest Ref Range: 70 - 99 mg/dL 138 (H) 166 (H) 127 (H) 155 (H) 150 (H) 73 66 (L)   Diabetes history:  DM2 Outpatient Diabetes medications:  Lantus 8 units daily Current orders for Inpatient glycemic control:  Lantus 5 units QHS Novolog 0-9 units Q4H  Inpatient Diabetes Program Recommendations:     Novolog 0-6 units q4h  Will continue to follow while inpatient.  Thank you, Reche Dixon, RN, BSN Diabetes Coordinator Inpatient Diabetes Program [phone] (team pager from 8a-5p)

## 2020-09-23 NOTE — Consult Note (Addendum)
[facility] Nurse Consult Note: Patient receiving care in [place] Reason for Consult: Leg wounds Wound type: Partial thickness abrasions on bilateral LE which patient states are from a fall. Patient also complaining of bilateral heel pain. Assessed both heels which were clear from any PIs. Will order Prevalon boots to prevent heel pressure. Pressure Injury POA: NA Wound bed: Red/Pink Drainage (amount, consistency, odor) Bloody Periwound: Intact Dressing procedure/placement/frequency: Continue foam dressings on abrasions on LE. Peel down all sites with a foam dressing EACH shift.  Record your observations.  Change foam dressing every 3 days or PRN soiling.   Notify the physician team if the area worsens  Monitor the wound area(s) for worsening of condition such as: Signs/symptoms of infection,  Increase in size,  Development of or worsening of odor, Development of pain, or increased pain at the affected locations.   Notify the medical team if any of these develop.  Thank you for the consult.  Discussed plan of care with the patient.  [facility] nurse will not follow at this time.   Please re-consult the [facility] team if needed.  Cathlean Marseilles Tamala Julian, MSN, RN, [place], Lysle Pearl, [facility] Wound Treatment Associate  Pager [phone]

## 2020-09-23 NOTE — Evaluation (Signed)
Occupational Therapy Evaluation Patient Details Name: [name] MRN: [id number] DOB: 1931/04/03 Today's Date: 09/23/2020    History of Present Illness Pt is an 86 year old man admitted on 09/22/20 with nausea, weakness and spitting blood. + hyponatremia. PMH: CHF with EF 25-30%, ICD, HTN, [name] 3, carotid stent, throat ca, DM2, MI, PVD, percardial effusion, TIA.   Clinical Impression   Pt lives alone with an aide who comes daily for 3 hours, except on Wednes[facility] with [facility]. Aide assists with IADL, son visits daily and sponge bathes pt. Pt is able to dress himself with use of a sock aide. He also manages his own tube feedings. Pt is HOH, difficult to accurately assess cognition. He presents with impaired standing balance and generalized weakness. Will follow acutely. Recommending HHOT.     Follow Up Recommendations  Home Health OT    Equipment Recommendations  None recommended by OT    Recommendations for Other Services       Precautions / Restrictions Precautions Precautions: Fall Precaution Comments: gtube fed      Mobility Bed Mobility Overal bed mobility: Modified Independent             General bed mobility comments: HOB up    Transfers Overall transfer level: Needs assistance Equipment used: Rolling walker (2 wheeled) Transfers: Sit to/from Stand Sit to Stand: Min assist         General transfer comment: min assist to stabilize walker, pt is used to pulling up on his rollator    Balance Overall balance assessment: Needs assistance   Sitting balance-Leahy Scale: Good       Standing balance-Leahy Scale: Poor Standing balance comment: min assist for static standing at sink                           ADL either performed or assessed with clinical judgement   ADL Overall ADL's : Needs assistance/impaired Eating/Feeding: NPO   Grooming: Wash/dry hands;Standing;Minimal assistance Grooming Details (indicate cue type  and reason): min assist for balance   Upper Body Bathing Details (indicate cue type and reason): dependent at baseline   Lower Body Bathing Details (indicate cue type and reason): dependent at baseline Upper Body Dressing : Set up;Sitting   Lower Body Dressing: Maximal assistance Lower Body Dressing Details (indicate cue type and reason): for socks in absence of sock aid Toilet Transfer: Min guard;Ambulation;RW   Toileting- Clothing Manipulation and Hygiene: Sit to/from stand;Minimal assistance       Functional mobility during ADLs: Min guard;Rolling walker       Vision Patient Visual Report: No change from baseline       Perception     Praxis      Pertinent Vitals/Pain Pain Assessment: Faces Faces Pain Scale: Hurts little more Pain Location: buttocks Pain Descriptors / Indicators: Sore Pain Intervention(s): Repositioned;Other (comment) (placed pillow in chair)     Hand Dominance Right   Extremity/Trunk Assessment Upper Extremity Assessment Upper Extremity Assessment: Overall WFL for tasks assessed   Lower Extremity Assessment Lower Extremity Assessment: Defer to PT evaluation   Cervical / Trunk Assessment Cervical / Trunk Assessment: Kyphotic   Communication Communication Communication: HOH   Cognition Arousal/Alertness: Awake/alert Behavior During Therapy: WFL for tasks assessed/performed Overall Cognitive Status: Difficult to assess  General Comments: pt manages his own tube feedings   General Comments       Exercises     Shoulder Instructions      Home Living Family/patient expects to be discharged to:: Private residence Living Arrangements: Alone Available Help at Discharge: Family;Available PRN/intermittently;Personal care attendant Type of Home: Apartment Home Access: Level entry     Home Layout: One level     Bathroom Shower/Tub: Teacher, early years/pre: Standard     Home  Equipment: Environmental consultant - 4 wheels;Cane - quad;Bedside commode;Shower seat;Cane - single point;Adaptive equipment Adaptive Equipment: Sock aid;Reacher Additional Comments: uses sock aide      Prior Functioning/Environment Level of Independence: Needs assistance  Gait / Transfers Assistance Needed: walks with a rollator ADL's / Homemaking Assistance Needed: son sponge bathes him, has an aide to help with IADL 3 hours a day except when [facility] on Wednes[facility], dresses himself with use of AE            OT Problem List: Decreased activity tolerance;Impaired balance (sitting and/or standing);Decreased knowledge of use of DME or AE      OT Treatment/Interventions: Self-care/ADL training;DME and/or AE instruction;Patient/family education;Balance training    OT Goals(Current goals can be found in the care plan section) Acute Rehab OT Goals Patient Stated Goal: to get feeding OT Goal Formulation: With patient Time For Goal Achievement: 10/07/20 Potential to Achieve Goals: Good ADL Goals Pt Will Perform Grooming: with supervision;standing Pt Will Perform Lower Body Dressing: with supervision;sit to/from stand Pt Will Transfer to Toilet: with supervision;ambulating Pt Will Perform Toileting - Clothing Manipulation and hygiene: with supervision;sit to/from stand  OT Frequency: Min 2X/week   Barriers to D/C:            Co-evaluation              AM-PAC OT "6 Clicks" Daily Activity     Outcome Measure Help from another person eating meals?: Total Help from another person taking care of personal grooming?: A Little Help from another person toileting, which includes using toliet, bedpan, or urinal?: A Little Help from another person bathing (including washing, rinsing, drying)?: Total Help from another person to put on and taking off regular upper body clothing?: A Little Help from another person to put on and taking off regular lower body clothing?: A Lot 6 Click  Score: 13   End of Session Equipment Utilized During Treatment: Gait belt;Rolling walker Nurse Communication: Other (comment) (needs condom cath replaced)  Activity Tolerance: Patient tolerated treatment well Patient left: in chair;with call bell/phone within reach  OT Visit Diagnosis: Unsteadiness on feet (R26.81);Other abnormalities of gait and mobility (R26.89);Muscle weakness (generalized) (M62.81)                Time: 1791-5056 OT Time Calculation (min): 28 min Charges:  OT General Charges $OT Visit: 1 Visit OT Evaluation $OT Eval Moderate Complexity: 1 Mod  Nestor Lewandowsky, OTR/L Acute Rehabilitation Services Pager: [phone] Office: [phone]  Malka So 09/23/2020, 12:14 PM

## 2020-09-24 ENCOUNTER — Inpatient Hospital Stay (HOSPITAL_COMMUNITY): Payer: Medicare (Managed Care)

## 2020-09-24 LAB — GLUCOSE, CAPILLARY
Glucose-Capillary: 104 mg/dL — ABNORMAL HIGH (ref 70–99)
Glucose-Capillary: 116 mg/dL — ABNORMAL HIGH (ref 70–99)
Glucose-Capillary: 126 mg/dL — ABNORMAL HIGH (ref 70–99)
Glucose-Capillary: 136 mg/dL — ABNORMAL HIGH (ref 70–99)
Glucose-Capillary: 178 mg/dL — ABNORMAL HIGH (ref 70–99)

## 2020-09-24 LAB — BASIC METABOLIC PANEL
Anion gap: 12 (ref 5–15)
BUN: 94 mg/dL — ABNORMAL HIGH (ref 8–23)
CO2: 27 mmol/L (ref 22–32)
Calcium: 9.2 mg/dL (ref 8.9–10.3)
Chloride: 89 mmol/L — ABNORMAL LOW (ref 98–111)
Creatinine, Ser: 1.97 mg/dL — ABNORMAL HIGH (ref 0.61–1.24)
GFR, Estimated: 32 mL/min — ABNORMAL LOW (ref >60)
Glucose, Bld: 113 mg/dL — ABNORMAL HIGH (ref 70–99)
Potassium: 4 mmol/L (ref 3.5–5.1)
Sodium: 128 mmol/L — ABNORMAL LOW (ref 135–145)

## 2020-09-24 LAB — CBC
HCT: 33.3 % — ABNORMAL LOW (ref 39.0–52.0)
Hemoglobin: 11 g/dL — ABNORMAL LOW (ref 13.0–17.0)
MCH: 30.3 pg (ref 26.0–34.0)
MCHC: 33 g/dL (ref 30.0–36.0)
MCV: 91.7 fL (ref 80.0–100.0)
Platelets: 168 10*3/uL (ref 150–400)
RBC: 3.63 MIL/uL — ABNORMAL LOW (ref 4.22–5.81)
RDW: 13.4 % (ref 11.5–15.5)
WBC: 8.4 10*3/uL (ref 4.0–10.5)
nRBC: 0 % (ref 0.0–0.2)

## 2020-09-24 MED ORDER — OSMOLITE 1.2 CAL PO LIQD
1000.0000 mL | ORAL | Status: DC
  Administered 2020-09-24: 1000 mL
  Filled 2020-09-24: qty 1000

## 2020-09-24 MED ORDER — POLYMYXIN B-TRIMETHOPRIM 10000-0.1 UNIT/ML-% OP SOLN
2.0000 [drp] | Freq: Two times a day (BID) | OPHTHALMIC | Status: AC
  Administered 2020-09-24 – 2020-09-26 (×4): 2 [drp] via OPHTHALMIC
  Filled 2020-09-24: qty 10

## 2020-09-24 MED ORDER — CAPSAICIN 0.075 % EX CREA
TOPICAL_CREAM | Freq: Two times a day (BID) | CUTANEOUS | Status: AC | PRN
  Administered 2020-09-25: 1 via TOPICAL
  Filled 2020-09-24: qty 57

## 2020-09-24 MED ORDER — OSMOLITE 1.2 CAL PO LIQD
1000.0000 mL | ORAL | Status: DC
  Filled 2020-09-24: qty 1000

## 2020-09-24 MED ORDER — PROSOURCE TF PO LIQD
45.0000 mL | Freq: Every day | ORAL | Status: DC
  Administered 2020-09-24 – 2020-09-28 (×5): 45 mL
  Filled 2020-09-24 (×5): qty 45

## 2020-09-24 MED ORDER — NEPRO/CARBSTEADY PO LIQD
237.0000 mL | Freq: Four times a day (QID) | ORAL | Status: DC
  Administered 2020-09-24 – 2020-09-28 (×15): 237 mL
  Filled 2020-09-24: qty 237

## 2020-09-24 MED ORDER — RESOURCE THICKENUP CLEAR PO POWD
ORAL | Status: DC | PRN
  Filled 2020-09-24: qty 125

## 2020-09-24 MED ORDER — CAPSAICIN 0.075 % EX CREA: TOPICAL_CREAM | Freq: Two times a day (BID) | CUTANEOUS | Status: DC | PRN

## 2020-09-24 MED ORDER — CHLORHEXIDINE GLUCONATE 0.12 % MT SOLN
15.0000 mL | Freq: Two times a day (BID) | OROMUCOSAL | Status: DC
  Administered 2020-09-25 – 2020-09-27 (×6): 15 mL via OROMUCOSAL
  Filled 2020-09-24 (×8): qty 15

## 2020-09-24 MED ORDER — ASPIRIN EC 81 MG PO TBEC
81.0000 mg | DELAYED_RELEASE_TABLET | Freq: Every day | ORAL | Status: DC
  Administered 2020-09-24 – 2020-09-26 (×3): 81 mg via ORAL
  Filled 2020-09-24 (×2): qty 1

## 2020-09-24 MED ORDER — ZINC OXIDE 40 % EX OINT
1.0000 "application " | TOPICAL_OINTMENT | Freq: Once | CUTANEOUS | Status: AC
  Administered 2020-09-25: 1 via TOPICAL
  Filled 2020-09-24: qty 57

## 2020-09-24 MED ORDER — CARVEDILOL 6.25 MG PO TABS
6.2500 mg | ORAL_TABLET | Freq: Two times a day (BID) | ORAL | Status: DC
  Administered 2020-09-24 – 2020-09-28 (×9): 6.25 mg
  Filled 2020-09-24 (×9): qty 1

## 2020-09-24 MED ORDER — ORAL CARE MOUTH RINSE
15.0000 mL | Freq: Two times a day (BID) | OROMUCOSAL | Status: DC
  Administered 2020-09-26 – 2020-09-27 (×4): 15 mL via OROMUCOSAL

## 2020-09-24 NOTE — Progress Notes (Signed)
HD#2 Subjective:  Overnight Events: None   This morning patients is awake in bed wanting to get up and walk. States he was able to eat jello and other thickened liquids yesterday, did not get tube feeds yesterday.   Objective:  Vital signs in last 24 hours: Vitals:   09/23/20 1710 09/23/20 2003 09/24/20 0052 09/24/20 0409  BP: (!) 126/56 (!) 111/49 95/79 112/66  Pulse:  76 82 78  Resp:  17 18 16   Temp:  98.3 F (36.8 C) 98.2 F (36.8 C) 98 F (36.7 C)  TempSrc:  Oral Oral Oral  SpO2:  100% 100% 100%  Weight:    62.4 kg  Height:       Supplemental O2: Room Air SpO2: 100 %   Physical Exam:  Physical Exam Constitutional:      Appearance: Normal appearance.  Abdominal:     General: Abdomen is flat. There is no distension.     Palpations: Abdomen is soft.     Comments: Gastric tube in place  Skin:    General: Skin is warm and dry.     Comments: Lower extremity wounds cleaned and dressed  Neurological:     Mental Status: He is alert and oriented to person, place, and time. Mental status is at baseline.     Filed Weights   09/22/20 1516 09/23/20 0323 09/24/20 0409  Weight: 64.9 kg 61.3 kg 62.4 kg     Intake/Output Summary (Last 24 hours) at 09/24/2020 0551 Last data filed at 09/24/2020 0409 Gross per 24 hour  Intake 0 ml  Output 2600 ml  Net -2600 ml   Net IO Since Admission: -4,140 mL [09/24/20 0551]  Pertinent Labs: CBC Latest Ref Rng & Units 09/24/2020 09/23/2020 09/22/2020  WBC 4.0 - 10.5 K/uL 8.4 10.6(H) -  Hemoglobin 13.0 - 17.0 g/dL 11.0(L) 11.2(L) 13.3  Hematocrit 39.0 - 52.0 % 33.3(L) 33.2(L) 39.0  Platelets 150 - 400 K/uL 168 173 -    CMP Latest Ref Rng & Units 09/24/2020 09/23/2020 09/23/2020  Glucose 70 - 99 mg/dL 113(H) 102(H) 102(H)  BUN 8 - 23 mg/dL 94(H) 100(H) 104(H)  Creatinine 0.61 - 1.24 mg/dL 1.97(H) 1.88(H) 1.88(H)  Sodium 135 - 145 mmol/L 128(L) 126(L) 124(L)  Potassium 3.5 - 5.1 mmol/L 4.0 4.0 3.9  Chloride 98 - 111 mmol/L  89(L) 85(L) 84(L)  CO2 22 - 32 mmol/L 27 27 31   Calcium 8.9 - 10.3 mg/dL 9.2 9.6 9.3  Total Protein 6.5 - 8.1 g/dL - - 5.9(L)  Total Bilirubin 0.3 - 1.2 mg/dL - - 0.3  Alkaline Phos 38 - 126 U/L - - 75  AST 15 - 41 U/L - - 52(H)  ALT 0 - 44 U/L - - 33    Imaging: No results found.  Assessment/Plan:   Principal Problem:   Hyponatremia Active Problems:   Type 2 diabetes mellitus with peripheral vascular disease (HCC)   Chronic systolic congestive heart failure (HCC)   [name] (chronic kidney disease) stage 3, GFR 30-59 ml/min (HCC)   Pharyngeal dysphagia  Patient Summary: [name] is 86yo male with chronic systolic and diastolic heart failure (11/9028 EF 09-23%, grade I diastolic dysfunction) 2/2 ischemic cardiomyopathy s/p ICD placement (2005), hypertension, CKDIII, PAD s/p L carotid stent (2004), throat cancer s/p radiation, type II diabetes mellitus admitted 12/29 for evaluation and work-up of hyponatremia.  #Hyponatremia Sodium improved to 128 this morning with fluid restriction. Advised patient to limit amount of water he is putting through his feeding  tube. Appears patient did not receive tube feeds yesterday will restart today.   - Restrict free water intake  - Restart tube feeds, consult to register dietician  - 200 ml fee water with tube feeds 4 times daily in addition to PO intake - monitor BMP  #Epistaxis- resolved Hgb of 11 this morning remains stable. Will restart ASA given history of PAD. Does not appear to be bleeding at this time.  -Restart ASA 81 mg daily  #[name] IIIB/V #Hypertension Baseline Cr appears to be around 1.6-1.8 with increase to 1.97 today.  -Continue Coreg  -monitor BMP  #Type II diabetes mellitus CBG of 73 this morning. Appears tube feeds not started yesterday. Dietician recommendations are charted will restart feeds today.  -SSI -Lantus 5 units nightly -CBG monitoring  DIET: tube feeds IV fluid: None VTE ppx: Scds Code status:  Full  Dispo: Anticipated discharge to Home in 1 day.  Robert Beard, MD 09/24/2020, 5:51 AM Pager: [phone]  Please contact the on call pager after 5 pm and on weekends at [phone].

## 2020-09-24 NOTE — Progress Notes (Signed)
Modified Barium Swallow Progress Note  Patient Details  Name: [name] MRN: [id number] Date of Birth: Apr 26, 1931  Today's Date: 09/24/2020    Modified Barium Swallow completed.  Full report located under Chart Review in the Imaging Section.  Brief recommendations include the following:  Clinical Impression  Pt's continues to present with radiation-induced dysphagia, and function remains consistent with October 2021 study.  He demonstrated poor pharyngeal constriction and reduced mobility of the laryngeal structures that put traction on the UES and close the laryngeal vestibule.  Impaired movement of these structures led to accumulation of pharyngeal residue (greater residue with thicker substances) as a result of poor UES opening and poor pharyngeal squeeze.  All consistencies were eventually aspirated, either before, during, or after the swallow response.  Postural manipulations (head turns/tilts) and supine positioning were attempted, but they offered no benefit.  Pt is in a difficult position.  The impact of radiation on swallowing function can be progressive; pt does not appear to be following through with the home exercise program given to him in June.  He has no support at home to help with diet modifications, PEG feedings, or to encourage exercise.  He is at chronic risk for aspiration, discomfort with eating, and airway obstruction.  If he derives pleasure from eating, the recommendations are for single, small sips of thin liquid with an effortful swallow, and dry swallows after bites of puree. Will f/u this afternoon for results/education.    Swallow Evaluation Recommendations       SLP Diet Recommendations: Other (Comment) (small, single sips thin liquid and bites of puree)       Medication Administration: Via alternative means   Supervision: Patient able to self feed   Compensations: Slow rate;Small sips/bites;Effortful swallow;Multiple dry swallows after each  bite/sip       Oral Care Recommendations: Oral care QID       [name] Tivis Ringer, [place] Office number [phone] Pager [phone]  [name] 09/24/2020,2:19 PM

## 2020-09-24 NOTE — Progress Notes (Signed)
Speech Language Pathology Treatment: Dysphagia  Patient Details Name: [name] MRN: [id number] DOB: 1931/08/30 Today's Date: 09/24/2020 Time: 1350-1420 SLP Time Calculation (min) (ACUTE ONLY): 30 min  Assessment / Plan / Recommendation Clinical Impression  Returned to Mr. Silversmith room to spend some time reviewing results of today's MBS and recommendations.  Mr. 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  We reviewed today's MBS, the limited improvement since May and October, the ongoing aspiration of portions of whatever he eats/drinks, regardless of consistency.  We talked about the value of getting all his nutrition through the PEG. We talked about trying small amounts of solid foods (soft/pureed) and small sips of thin liquid by mouth if it brings him pleasure and it is comfortable for him.   It appears that Dr. Isidore Moos is going to look into OP SLP again.  If Mr. Mones is willing to do his exercises, that could offer some benefit; he was given a home exercise program in June but it is unclear if he has been following through. He asked me directly if he would ever eat regular foods again.  I responded that unfortunately the cost of treating the cancer is often long-term damage to the swallowing muscles.    He was not happy with my response and verbalized his faith that God would heal his throat. I offered encouragement and support.  Recommend allowing small sips of thin liquid as recommended after October's MBS.  He may enjoy small bites of puddings/purees with an effortful swallow and a f/u dry swallow to help transition through the UES.   SLP will f/u again while he is here.      HPI HPI: [name] is a 86 y.o. 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 Pt has a complicated hx of post-radiation dysphagia. G-tube 11/27/19. He has had multiple MBS studies this year: 3/1, 3/15,  5/26, and 10/28. All studies revealed moderate-severe vallecular residue due to decreased base of tongue propulsion, decreased pharyngeal constriction and reduced epiglottic deflection; there was some degree of aspiration present during all exams.  Final recommendation in October was for purees and thin liquids via small sips given no support at home for assistance with a modified diet.   Pt was followed by OP SLP June 2021 and provided with a home exercise program addressing lingual, vocal, and pharyngeal strengthening. He was instructed to complete this program 2 times a day, until 6 months after his last rad tx, then x2 a week after that. He was most recently seen by Dr. Isidore Moos, radiation oncology, on 09/14/20: "Continue swallowing therapy; he is PEG dependent and reports that he just does not feel ready to try to eat or drink by mouth right now; he was seen by speech-language pathology about a month and a half ago and underwent swallowing study at that time.  Is not clear if he is getting outpatient therapy any longer and so I will ask our navigator to look into this."      SLP Plan  Continue with current plan of care       Recommendations  Diet recommendations: Other(comment) (bites of puree, sips of thin liquids) Medication  Administration: Via alternative means Supervision: Patient able to self feed Compensations: Slow rate;Small sips/bites;Effortful swallow;Multiple dry swallows after each bite/sip Postural Changes and/or Swallow Maneuvers: Seated upright 90 degrees                Oral Care Recommendations: Oral care QID SLP Visit Diagnosis: Dysphagia, pharyngeal phase (R13.13) Plan: Continue with current plan of  care       GO                [name] 09/24/2020, 2:22 PM  Ellwyn Ergle L. Tivis Ringer, [place] Office number [phone] Pager [phone]

## 2020-09-24 NOTE — Progress Notes (Addendum)
Initial Nutrition Assessment  DOCUMENTATION CODES:   Severe malnutrition in context of chronic illness  INTERVENTION:   -Magic cup TID with meals, each supplement provides 290 kcal and 9 grams of protein -Transition to bolus feedings:  237 ml (1 carton/ ARC) Nepro QID via PEG  30 ml Prosource TF daily.    200 ml free water flush 4 times daily (adjusted by MD)  Tube feeding regimen provides 1720 kcal (93% of needs), 87 grams of protein, and 688 ml of H2O.  Total free water: 1488 ml daily  NUTRITION DIAGNOSIS:   Severe Malnutrition related to chronic illness (pharyngeal cancer) as evidenced by severe fat depletion,severe muscle depletion.  GOAL:   Patient will meet greater than or equal to 90% of their needs  MONITOR:   Diet advancement,PO intake,Labs,Weight trends,TF tolerance,Skin,I & O's  REASON FOR ASSESSMENT:   Consult Assessment of nutrition requirement/status,Enteral/tube feeding initiation and management  ASSESSMENT:   86 year old person living with pharyngeal carcinoma being treated with radiation therapy, complicated by pharyngeal dysphagia receiving nutrition through a percutaneous gastric tube, presented to the hospital with nausea and vomiting and admitted to our service with severe hyponatremia  Pt admitted with severe hyponatremia.   Reviewed I/O's: -3.2 L x 24 hours and -4.7 L since admission  UOP: 3.2 L x 24 hours  Spoke with pt at bedside, who reports feeling better today. He shares that he "eats a little" by mouth at home, which consists mostly of liquids. Attempted to obtain diet recall, however, pt very vague about which foods and liquids he consumes. Pt shares that he does not use thickener at home (RD suspects this statement is in accurate, as previous RD notes reveal that pt is followed by home health SLP, who has been working with pt and family and preparing thickened liquids"). Pt shares that he consumes "something that looks like milk, but taste  like vanilla". RD suggested that this may be Ensure or Boost, however, pt declined that this was what it was.   Pt confirms that he has a PEG, however, unable to tell this RD about home TF formula or regimen. Per previous RD notes, home regimen is 4 cartons (237 ml) Nepro daily and 30 ml Prosource daily, which provides 1875 kcals, 90 grams protein, and 905 ml free water daily, meeting 100% of estimated kcal and protein needs.   Pt reports his UBW is around 125#. He reports he last weighed 140# at his home PTA. Reviewed wt hx; pt wt has been stable over the past year.   ADDENDUM (1308): Case discussed with RN and MD. MD is concerned about pt's free water intake- he apparently uses large quantities of water with his bolus feedings. RN is requesting transitioning to bolus feedings, as this is his home regimen. Called to pt room phone, however, unable to reach.   Reviewed wt hx; wt has been stable over the past 6 months.   Labs reviewed: Na: 128,CBGS: 98-136  NUTRITION - FOCUSED PHYSICAL EXAM:  Flowsheet Row Most Recent Value  Orbital Region Severe depletion  Upper Arm Region Severe depletion  Thoracic and Lumbar Region Severe depletion  Buccal Region Severe depletion  Temple Region Severe depletion  Clavicle Bone Region Severe depletion  Clavicle and Acromion Bone Region Severe depletion  Scapular Bone Region Severe depletion  Dorsal Hand Severe depletion  Patellar Region Severe depletion  Anterior Thigh Region Severe depletion  Posterior Calf Region Severe depletion  Edema (RD Assessment) Mild  Hair Reviewed  Eyes  Reviewed  Mouth Reviewed  Skin Reviewed  Nails Reviewed       Diet Order:   Diet Order            Diet full liquid Room service appropriate? Yes; Fluid consistency: Honey Thick  Diet effective now                 EDUCATION NEEDS:   No education needs have been identified at this time  Skin:  Skin Assessment: Skin Integrity Issues: Skin Integrity Issues::  Other (Comment) Other: open wounds to rt and lt pretibial  Last BM:  Unknown  Height:   Ht Readings from Last 1 Encounters:  09/22/20 5\' 11"  (1.803 m)    Weight:   Wt Readings from Last 1 Encounters:  09/24/20 62.4 kg    Ideal Body Weight:  78.2 kg  BMI:  Body mass index is 19.19 kg/m.  Estimated Nutritional Needs:   Kcal:  1850-2050  Protein:  90-105 grams  Fluid:  > 1.8 L    Loistine Chance, RD, LDN, [place] Registered Dietitian II Certified Diabetes Care and Education Specialist Please refer to [facility] for RD and/or RD on-call/weekend/after hours pager

## 2020-09-24 NOTE — Progress Notes (Signed)
Brief Nutrition Note  RD contacted by RN on weekend/on-call pager regarding initiation of tube feeds for pt. Pt with G-tube in place per flowsheets.  Adult Enteral Nutrition Protocol initiated. Full assessment to follow.  Admitting Dx: Emesis [R11.10] Hyponatremia [E87.1]  Labs:  Recent Labs  Lab 09/23/20 0217 09/23/20 1608 09/24/20 0330  NA 124* 126* 128*  K 3.9 4.0 4.0  CL 84* 85* 89*  CO2 31 27 27   BUN 104* 100* 94*  CREATININE 1.88* 1.88* 1.97*  CALCIUM 9.3 9.6 9.2  MG  --  2.4  --   PHOS  --  4.6  --   GLUCOSE 102* 102* 113*    Gustavus Bryant, MS, RD, LDN Inpatient Clinical Dietitian Please see [facility] for contact information.

## 2020-09-25 DIAGNOSIS — I10 Essential (primary) hypertension: Secondary | ICD-10-CM | POA: Diagnosis not present

## 2020-09-25 DIAGNOSIS — I5042 Chronic combined systolic (congestive) and diastolic (congestive) heart failure: Secondary | ICD-10-CM | POA: Diagnosis not present

## 2020-09-25 DIAGNOSIS — E871 Hypo-osmolality and hyponatremia: Secondary | ICD-10-CM | POA: Diagnosis not present

## 2020-09-25 LAB — BASIC METABOLIC PANEL
Anion gap: 11 (ref 5–15)
BUN: 91 mg/dL — ABNORMAL HIGH (ref 8–23)
CO2: 31 mmol/L (ref 22–32)
Calcium: 9.4 mg/dL (ref 8.9–10.3)
Chloride: 91 mmol/L — ABNORMAL LOW (ref 98–111)
Creatinine, Ser: 2.05 mg/dL — ABNORMAL HIGH (ref 0.61–1.24)
GFR, Estimated: 31 mL/min — ABNORMAL LOW (ref >60)
Glucose, Bld: 104 mg/dL — ABNORMAL HIGH (ref 70–99)
Potassium: 3.9 mmol/L (ref 3.5–5.1)
Sodium: 133 mmol/L — ABNORMAL LOW (ref 135–145)

## 2020-09-25 LAB — GLUCOSE, CAPILLARY
Glucose-Capillary: 110 mg/dL — ABNORMAL HIGH (ref 70–99)
Glucose-Capillary: 125 mg/dL — ABNORMAL HIGH (ref 70–99)
Glucose-Capillary: 133 mg/dL — ABNORMAL HIGH (ref 70–99)
Glucose-Capillary: 141 mg/dL — ABNORMAL HIGH (ref 70–99)
Glucose-Capillary: 144 mg/dL — ABNORMAL HIGH (ref 70–99)
Glucose-Capillary: 177 mg/dL — ABNORMAL HIGH (ref 70–99)
Glucose-Capillary: 180 mg/dL — ABNORMAL HIGH (ref 70–99)
Glucose-Capillary: 47 mg/dL — ABNORMAL LOW (ref 70–99)

## 2020-09-25 LAB — MAGNESIUM: Magnesium: 2.3 mg/dL (ref 1.7–2.4)

## 2020-09-25 LAB — CBC
HCT: 34.4 % — ABNORMAL LOW (ref 39.0–52.0)
Hemoglobin: 11.3 g/dL — ABNORMAL LOW (ref 13.0–17.0)
MCH: 30.5 pg (ref 26.0–34.0)
MCHC: 32.8 g/dL (ref 30.0–36.0)
MCV: 93 fL (ref 80.0–100.0)
Platelets: 178 10*3/uL (ref 150–400)
RBC: 3.7 MIL/uL — ABNORMAL LOW (ref 4.22–5.81)
RDW: 13.6 % (ref 11.5–15.5)
WBC: 10.3 10*3/uL (ref 4.0–10.5)
nRBC: 0 % (ref 0.0–0.2)

## 2020-09-25 LAB — PHOSPHORUS: Phosphorus: 3.6 mg/dL (ref 2.5–4.6)

## 2020-09-25 MED ORDER — DEXTROSE 50 % IV SOLN
INTRAVENOUS | Status: AC
  Administered 2020-09-25: 25 mL
  Filled 2020-09-25: qty 50

## 2020-09-25 NOTE — Care Management (Addendum)
12:20 LM w answering service from [facility] notifying of potential DC w [facility] services and need for respite care. Awaiting callback from [place]. 13:10 Received call back from [facility]. They requested I FAX [place] orders to [phone] and they will set up [facility] services Monday when their office reopens. Dr Lisabeth Devoid updated.

## 2020-09-25 NOTE — Progress Notes (Signed)
Occupational Therapy Treatment Patient Details Name: [name] MRN: [id number] DOB: Apr 03, 1931 Today's Date: 09/25/2020    History of present illness Pt is an 87 year old man admitted on 09/22/20 with nausea, weakness and spitting blood. + hyponatremia. PMH: CHF with EF 25-30%, ICD, HTN, [name] 3, carotid stent, throat ca, DM2, MI, PVD, percardial effusion, TIA.   OT comments  Patient needing a little more assist this date.  Min A with bed mobility and increased posterior lean.  Patient yelling he's going to fall.  No fall, and no noted knee buckle.  HH has been recommended, OT and patient discussed if his mobility improves and he can arrange 24 hour care initially, home is a possibility.  SNF for short term rehab is an option.    Follow Up Recommendations  Home health OT    Equipment Recommendations  None recommended by OT  Vs SNF   Recommendations for Other Services      Precautions / Restrictions Precautions Precautions: Fall Precaution Comments: gtube fed       Mobility Bed Mobility Overal bed mobility: Needs Assistance Bed Mobility: Supine to Sit     Supine to sit: Min assist        Transfers Overall transfer level: Needs assistance Equipment used: Rolling walker (2 wheeled) Transfers: Sit to/from [facility] Sit to Stand: Min assist Stand pivot transfers: Min guard            Balance Overall balance assessment: Needs assistance Sitting-balance support: Feet supported Sitting balance-Leahy Scale: Good     Standing balance support: Bilateral upper extremity supported;During functional activity Standing balance-Leahy Scale: Poor Standing balance comment: posterior lean noted and patient yelling he's about to fall.                           ADL either performed or assessed with clinical judgement   ADL Overall ADL's : Needs assistance/impaired Eating/Feeding: NPO   Grooming: Wash/dry hands;Wash/dry face;Set up            Upper Body Dressing : Set up;Sitting   Lower Body Dressing: Maximal assistance;Sitting/lateral leans               Functional mobility during ADLs: Min guard;Rolling walker                         Cognition  Patient is very demanding                                                                Pertinent Vitals/ Pain       Pain Assessment: Faces Faces Pain Scale: Hurts a little bit Pain Location: buttocks Pain Descriptors / Indicators: Sore Pain Intervention(s): Monitored during session;Repositioned                                                          Frequency  Min 2X/week        Progress Toward Goals  OT Goals(current goals can now be found in the care plan section)  Acute Rehab OT Goals Patient Stated Goal: I need to start moving OT Goal Formulation: With patient Time For Goal Achievement: 10/07/20 Potential to Achieve Goals: Good  Plan      Co-evaluation                 AM-PAC OT "6 Clicks" Daily Activity     Outcome Measure   Help from another person eating meals?: Total Help from another person taking care of personal grooming?: A Little Help from another person toileting, which includes using toliet, bedpan, or urinal?: A Little Help from another person bathing (including washing, rinsing, drying)?: A Lot Help from another person to put on and taking off regular upper body clothing?: A Little Help from another person to put on and taking off regular lower body clothing?: A Lot 6 Click Score: 14    End of Session Equipment Utilized During Treatment: Rolling walker  OT Visit Diagnosis: Unsteadiness on feet (R26.81);Other abnormalities of gait and mobility (R26.89);Muscle weakness (generalized) (M62.81)   Activity Tolerance Patient tolerated treatment well   Patient Left in chair;with call bell/phone within reach;with chair alarm set   Nurse Communication Mobility  status        Time: [phone] OT Time Calculation (min): 21 min  Charges: OT General Charges $OT Visit: 1 Visit OT Treatments $Self Care/Home Management : 8-22 mins  09/25/2020  Rich, OTR/L  Acute Rehabilitation Services  Office:  [phone]    Metta Clines 09/25/2020, 9:53 AM

## 2020-09-25 NOTE — Progress Notes (Signed)
HD#3 Subjective:  Overnight Events:SLP still having difficult swallowing and  small sips with thin liquids    States he is feeling pretty good. States he was wondering if he will ever be able to swallow since he was told he might not be able to swallow again. States he has been to rehab in the past and did not like it. States he will rather be at home. He states his son will be around to help him.  He will need outpatient therapy to see if he is able to swallow but will likely to continue to have radiation-induced dysphagia.  Objective:  Vital signs in last 24 hours: Vitals:   09/24/20 2000 09/25/20 0016 09/25/20 0300 09/25/20 0527  BP: (!) 99/47 (!) 120/57  (!) 111/58  Pulse: 81 70  76  Resp: 18 18  18   Temp: 99 F (37.2 C) 98.1 F (36.7 C)  (!) 97.4 F (36.3 C)  TempSrc: Oral Oral  Oral  SpO2: 98% 100%  99%  Weight:   61.8 kg   Height:       Supplemental O2: Room Air SpO2: 99 %   Physical Exam:  Physical Exam Constitutional:      Appearance: Normal appearance.     Comments: Sitting up in chair  Eyes:     Extraocular Movements: Extraocular movements intact.     Conjunctiva/sclera: Conjunctivae normal.  Abdominal:     Comments: Gastric tube in place  Skin:    General: Skin is warm and dry.     Comments: Lower extremity wounds cleaned and dressed  Neurological:     Mental Status: He is alert and oriented to person, place, and time. Mental status is at baseline.     Filed Weights   09/23/20 0323 09/24/20 0409 09/25/20 0300  Weight: 61.3 kg 62.4 kg 61.8 kg     Intake/Output Summary (Last 24 hours) at 09/25/2020 0548 Last data filed at 09/25/2020 0528 Gross per 24 hour  Intake 1260 ml  Output 1700 ml  Net -440 ml   Net IO Since Admission: -3,780 mL [09/25/20 0548]  Pertinent Labs: CBC Latest Ref Rng & Units 09/25/2020 09/24/2020 09/23/2020  WBC 4.0 - 10.5 K/uL 10.3 8.4 10.6(H)  Hemoglobin 13.0 - 17.0 g/dL 11.3(L) 11.0(L) 11.2(L)  Hematocrit 39.0 - 52.0 %  34.4(L) 33.3(L) 33.2(L)  Platelets 150 - 400 K/uL 178 168 173    CMP Latest Ref Rng & Units 09/25/2020 09/24/2020 09/23/2020  Glucose 70 - 99 mg/dL 104(H) 113(H) 102(H)  BUN 8 - 23 mg/dL 91(H) 94(H) 100(H)  Creatinine 0.61 - 1.24 mg/dL 2.05(H) 1.97(H) 1.88(H)  Sodium 135 - 145 mmol/L 133(L) 128(L) 126(L)  Potassium 3.5 - 5.1 mmol/L 3.9 4.0 4.0  Chloride 98 - 111 mmol/L 91(L) 89(L) 85(L)  CO2 22 - 32 mmol/L 31 27 27   Calcium 8.9 - 10.3 mg/dL 9.4 9.2 9.6  Total Protein 6.5 - 8.1 g/dL - - -  Total Bilirubin 0.3 - 1.2 mg/dL - - -  Alkaline Phos 38 - 126 U/L - - -  AST 15 - 41 U/L - - -  ALT 0 - 44 U/L - - -    Imaging: No results found.  Assessment/Plan:   Principal Problem:   Hyponatremia Active Problems:   Type 2 diabetes mellitus with peripheral vascular disease (HCC)   Chronic systolic congestive heart failure (HCC)   [name] (chronic kidney disease) stage 3, GFR 30-59 ml/min (HCC)   Pharyngeal dysphagia  Patient Summary: [name]  is 87yo male with chronic systolic and diastolic heart failure (10/2480 EF 50-03%, grade I diastolic dysfunction) 2/2 ischemic cardiomyopathy s/p ICD placement (2005), hypertension, CKDIII, PAD s/p L carotid stent (2004), throat cancer s/p radiation, type II diabetes mellitus admitted 12/29 for evaluation and work-up of hyponatremia.  #Hyponatremia Sodium of 133 this morning. This morning patient is feeling well expressed desire to go home rather than to subacute rehab. Will work with [facility] arrange safe discharge plan.  - Will educate patient on tube feeding and limiting free water - PT recommending HHPT with 24 hours supervision will arrange plan with [facility].   #[name] IIIB/V #Hypertension Cr of 2 this morning likely due to diureses with decreased intake. Will hold lasix today.  -Continue Coreg  - Hold lasix  DIET: tube feeds IV fluid: None VTE ppx: Scds Code status: Full  Dispo: Anticipated discharge to Home in today.  Robert Beard,  MD 09/25/2020, 5:48 AM Pager: [phone]  Please contact the on call pager after 5 pm and on weekends at [phone].

## 2020-09-26 DIAGNOSIS — I5042 Chronic combined systolic (congestive) and diastolic (congestive) heart failure: Secondary | ICD-10-CM | POA: Diagnosis not present

## 2020-09-26 DIAGNOSIS — E871 Hypo-osmolality and hyponatremia: Secondary | ICD-10-CM | POA: Diagnosis not present

## 2020-09-26 DIAGNOSIS — I10 Essential (primary) hypertension: Secondary | ICD-10-CM | POA: Diagnosis not present

## 2020-09-26 LAB — CBC
HCT: 33.8 % — ABNORMAL LOW (ref 39.0–52.0)
Hemoglobin: 11.6 g/dL — ABNORMAL LOW (ref 13.0–17.0)
MCH: 31.6 pg (ref 26.0–34.0)
MCHC: 34.3 g/dL (ref 30.0–36.0)
MCV: 92.1 fL (ref 80.0–100.0)
Platelets: 194 10*3/uL (ref 150–400)
RBC: 3.67 MIL/uL — ABNORMAL LOW (ref 4.22–5.81)
RDW: 13.6 % (ref 11.5–15.5)
WBC: 8.3 10*3/uL (ref 4.0–10.5)
nRBC: 0 % (ref 0.0–0.2)

## 2020-09-26 LAB — GLUCOSE, CAPILLARY
Glucose-Capillary: 119 mg/dL — ABNORMAL HIGH (ref 70–99)
Glucose-Capillary: 140 mg/dL — ABNORMAL HIGH (ref 70–99)
Glucose-Capillary: 150 mg/dL — ABNORMAL HIGH (ref 70–99)
Glucose-Capillary: 153 mg/dL — ABNORMAL HIGH (ref 70–99)
Glucose-Capillary: 164 mg/dL — ABNORMAL HIGH (ref 70–99)
Glucose-Capillary: 177 mg/dL — ABNORMAL HIGH (ref 70–99)
Glucose-Capillary: 97 mg/dL (ref 70–99)

## 2020-09-26 LAB — BASIC METABOLIC PANEL
Anion gap: 13 (ref 5–15)
BUN: 91 mg/dL — ABNORMAL HIGH (ref 8–23)
CO2: 28 mmol/L (ref 22–32)
Calcium: 9.4 mg/dL (ref 8.9–10.3)
Chloride: 93 mmol/L — ABNORMAL LOW (ref 98–111)
Creatinine, Ser: 2.03 mg/dL — ABNORMAL HIGH (ref 0.61–1.24)
GFR, Estimated: 31 mL/min — ABNORMAL LOW (ref >60)
Glucose, Bld: 120 mg/dL — ABNORMAL HIGH (ref 70–99)
Potassium: 3.8 mmol/L (ref 3.5–5.1)
Sodium: 134 mmol/L — ABNORMAL LOW (ref 135–145)

## 2020-09-26 MED ORDER — RAMELTEON 8 MG PO TABS
8.0000 mg | ORAL_TABLET | Freq: Once | ORAL | Status: AC
  Administered 2020-09-26: 8 mg via ORAL
  Filled 2020-09-26: qty 1

## 2020-09-26 MED ORDER — ASPIRIN 81 MG PO CHEW
81.0000 mg | CHEWABLE_TABLET | Freq: Every day | ORAL | Status: DC
  Administered 2020-09-27 – 2020-09-28 (×2): 81 mg
  Filled 2020-09-26 (×2): qty 1

## 2020-09-26 NOTE — Plan of Care (Signed)

## 2020-09-26 NOTE — Progress Notes (Signed)
Patient alert and oriented x 4 upon initial assessment this morning. He Is ab;e to make all needs known and denies pain or discomfort at this time. Patient was sitting in chair upon shift start but he was assisted to bed x 1 using front wheeled walker with minimal assist. His prevalon boots were replaced when in bed but he quickly requested they be removed because they were "hurting my toes". When explained that they do not touch his toes and then were rearranged, he insisted they come off. He also refused a pillow under his legs to elevate his heels. Patient lungs are clear to auscultation, bowel sounds active all quads. He receives nutrition via his PEG tube with Nepro QID and absolutely refuses to take anything by mouth, insisting he cannot swallow even liquids, despite being able to swallow oral secretions. This is due to radiation injury to the throat several years ago when being treated for throat cancer. Patient has many personal requests throughout the day such as "rub my toes, bring me a warm blanket, adjust the things on my table". Nurse has encouraged him to self perform those things (like covering himself) he is able to do by himself, as staff have many other patients. He gets a little irritable when this is encouraged, but is not rude at any given time. Call light in reach, all meds changed to crushable/tube acceptable forms today, will continue to monitor.

## 2020-09-26 NOTE — Progress Notes (Signed)
HD#4 Subjective:  Overnight Events: No events   [name] was seen at bedside this AM. He states that he is doing well this AM. He states that he would like to move around more, but has been confined to the bed and couch. We discussed PT coming to work with him. He voices understanding. All questions and concerns were addressed at bedside.   Objective:  Vital signs in last 24 hours: Vitals:   09/25/20 0527 09/25/20 1148 09/26/20 0118 09/26/20 1224  BP: (!) 111/58 93/67 124/64 (!) 111/59  Pulse: 76 73 80 76  Resp: 18 18 16 18   Temp: (!) 97.4 F (36.3 C) 98.1 F (36.7 C) 98.8 F (37.1 C) 98.4 F (36.9 C)  TempSrc: Oral Oral Oral Oral  SpO2: 99% 100% 99% 99%  Weight:      Height:       Supplemental O2: Room Air SpO2: 99 %   Physical Exam:  Physical Exam Vitals and nursing note reviewed.  Constitutional:      General: He is not in acute distress.    Appearance: He is not ill-appearing.  HENT:     Head: Normocephalic and atraumatic.  Cardiovascular:     Rate and Rhythm: Normal rate and regular rhythm.     Pulses: Normal pulses.     Heart sounds: Normal heart sounds. No murmur heard. No friction rub. No gallop.   Pulmonary:     Effort: Pulmonary effort is normal. No respiratory distress.     Breath sounds: Normal breath sounds. No stridor. No wheezing, rhonchi or rales.  Abdominal:     General: Abdomen is flat.     Palpations: Abdomen is soft.     Tenderness: There is no abdominal tenderness. There is no guarding or rebound.     Comments: G Tube in place  Musculoskeletal:     Right lower leg: No edema.     Left lower leg: No edema.  Skin:    General: Skin is warm and dry.  Neurological:     General: No focal deficit present.     Mental Status: He is alert and oriented to person, place, and time. Mental status is at baseline.  Psychiatric:        Mood and Affect: Mood normal.        Behavior: Behavior normal.     Filed Weights   09/23/20 0323 09/24/20  0409 09/25/20 0300  Weight: 61.3 kg 62.4 kg 61.8 kg     Intake/Output Summary (Last 24 hours) at 09/26/2020 1659 Last data filed at 09/26/2020 0300 Gross per 24 hour  Intake --  Output 950 ml  Net -950 ml   Net IO Since Admission: -4,150 mL [09/26/20 1659]  Pertinent Labs: CBC Latest Ref Rng & Units 09/26/2020 09/25/2020 09/24/2020  WBC 4.0 - 10.5 K/uL 8.3 10.3 8.4  Hemoglobin 13.0 - 17.0 g/dL 11.6(L) 11.3(L) 11.0(L)  Hematocrit 39.0 - 52.0 % 33.8(L) 34.4(L) 33.3(L)  Platelets 150 - 400 K/uL 194 178 168    CMP Latest Ref Rng & Units 09/26/2020 09/25/2020 09/24/2020  Glucose 70 - 99 mg/dL 120(H) 104(H) 113(H)  BUN 8 - 23 mg/dL 91(H) 91(H) 94(H)  Creatinine 0.61 - 1.24 mg/dL 2.03(H) 2.05(H) 1.97(H)  Sodium 135 - 145 mmol/L 134(L) 133(L) 128(L)  Potassium 3.5 - 5.1 mmol/L 3.8 3.9 4.0  Chloride 98 - 111 mmol/L 93(L) 91(L) 89(L)  CO2 22 - 32 mmol/L 28 31 27   Calcium 8.9 - 10.3 mg/dL 9.4 9.4 9.2  Total Protein 6.5 - 8.1 g/dL - - -  Total Bilirubin 0.3 - 1.2 mg/dL - - -  Alkaline Phos 38 - 126 U/L - - -  AST 15 - 41 U/L - - -  ALT 0 - 44 U/L - - -    Imaging: No results found.  Assessment/Plan:   Principal Problem:   Hyponatremia Active Problems:   Type 2 diabetes mellitus with peripheral vascular disease (HCC)   Chronic systolic congestive heart failure (HCC)   [name] (chronic kidney disease) stage 3, GFR 30-59 ml/min (HCC)   Pharyngeal dysphagia   Patient Summary: [name] is 87yo male with chronic systolic and diastolic heart failure (05/7025 EF 37-85%, grade I diastolic dysfunction) 2/2 ischemic cardiomyopathy s/p ICD placement (2005), hypertension, CKDIII, PAD s/p L carotid stent (2004), throat cancer s/p radiation, type II diabetes mellitus admitted 12/29 for evaluation and work-up of hyponatremia.  #Hyponatremia Sodium 133>134 this am, improving. Patient to be discharged home with [facility] w/ 24 hr supervision.  - Ct to educate patient on tube feeding and limiting free  water - [facility] to setup PT tomorrow   #[name] IIIB/IV #Hypertension Cr of 2, no improvement from yesterday, held home lasix 09/25/20. Thought to be secondary to diureses w/ decrease intake. BUN - Continue Coreg - Continue to hold lasix  Diet: Tube Feeds IVF: None,None VTE: SCDs Code: Full PT/OT recs: Home Health, 24 hr supervision  Dispo: Anticipated discharge to Home pending home PT planning.   Sanjuana Letters DO Internal Medicine Resident PGY-1 Pager [phone] Please contact the on call pager after 5 pm and on weekends at [phone].

## 2020-09-27 DIAGNOSIS — R011 Cardiac murmur, unspecified: Secondary | ICD-10-CM

## 2020-09-27 DIAGNOSIS — N1832 Chronic kidney disease, stage 3b: Secondary | ICD-10-CM | POA: Diagnosis not present

## 2020-09-27 DIAGNOSIS — I130 Hypertensive heart and chronic kidney disease with heart failure and stage 1 through stage 4 chronic kidney disease, or unspecified chronic kidney disease: Secondary | ICD-10-CM | POA: Diagnosis not present

## 2020-09-27 DIAGNOSIS — E871 Hypo-osmolality and hyponatremia: Secondary | ICD-10-CM | POA: Diagnosis not present

## 2020-09-27 DIAGNOSIS — I5042 Chronic combined systolic (congestive) and diastolic (congestive) heart failure: Secondary | ICD-10-CM | POA: Diagnosis not present

## 2020-09-27 LAB — BASIC METABOLIC PANEL
Anion gap: 12 (ref 5–15)
BUN: 89 mg/dL — ABNORMAL HIGH (ref 8–23)
CO2: 29 mmol/L (ref 22–32)
Calcium: 9.6 mg/dL (ref 8.9–10.3)
Chloride: 93 mmol/L — ABNORMAL LOW (ref 98–111)
Creatinine, Ser: 2.05 mg/dL — ABNORMAL HIGH (ref 0.61–1.24)
GFR, Estimated: 31 mL/min — ABNORMAL LOW (ref >60)
Glucose, Bld: 170 mg/dL — ABNORMAL HIGH (ref 70–99)
Potassium: 3.6 mmol/L (ref 3.5–5.1)
Sodium: 134 mmol/L — ABNORMAL LOW (ref 135–145)

## 2020-09-27 LAB — CBC
HCT: 33.6 % — ABNORMAL LOW (ref 39.0–52.0)
Hemoglobin: 10.8 g/dL — ABNORMAL LOW (ref 13.0–17.0)
MCH: 30.6 pg (ref 26.0–34.0)
MCHC: 32.1 g/dL (ref 30.0–36.0)
MCV: 95.2 fL (ref 80.0–100.0)
Platelets: 194 10*3/uL (ref 150–400)
RBC: 3.53 MIL/uL — ABNORMAL LOW (ref 4.22–5.81)
RDW: 13.7 % (ref 11.5–15.5)
WBC: 8.7 10*3/uL (ref 4.0–10.5)
nRBC: 0 % (ref 0.0–0.2)

## 2020-09-27 LAB — GLUCOSE, CAPILLARY
Glucose-Capillary: 124 mg/dL — ABNORMAL HIGH (ref 70–99)
Glucose-Capillary: 168 mg/dL — ABNORMAL HIGH (ref 70–99)
Glucose-Capillary: 170 mg/dL — ABNORMAL HIGH (ref 70–99)
Glucose-Capillary: 188 mg/dL — ABNORMAL HIGH (ref 70–99)
Glucose-Capillary: 217 mg/dL — ABNORMAL HIGH (ref 70–99)

## 2020-09-27 NOTE — Progress Notes (Addendum)
HD#5 Subjective:  Overnight Events: No events   [name] was seen at bedside this AM. He states that he is doing well this AM. He asks when he will be able to go home, discussed with [name] that we are waiting for home health/PT to be set up before discharging him. He has no other questions or concerns and states he is doing well.   Objective:  Vital signs in last 24 hours: Vitals:   09/26/20 0118 09/26/20 1224 09/26/20 1953 09/27/20 0411  BP: 124/64 (!) 111/59 (!) 100/58 (!) 119/56  Pulse: 80 76 78 74  Resp: 16 18 18 18   Temp: 98.8 F (37.1 C) 98.4 F (36.9 C) 98.3 F (36.8 C) 98.3 F (36.8 C)  TempSrc: Oral Oral Oral Oral  SpO2: 99% 99% 100% 100%  Weight:      Height:       Supplemental O2: Room Air SpO2: 100 %   Physical Exam:  Physical Exam Vitals and nursing note reviewed.  Constitutional:      General: He is not in acute distress.    Appearance: He is not ill-appearing.  HENT:     Head: Normocephalic and atraumatic.  Cardiovascular:     Rate and Rhythm: Normal rate and regular rhythm.     Pulses: Normal pulses.     Heart sounds: Murmur (2/6 systolic) heard.  No friction rub. No gallop.   Pulmonary:     Effort: Pulmonary effort is normal. No respiratory distress.     Breath sounds: Normal breath sounds. No stridor. No wheezing, rhonchi or rales.  Abdominal:     General: Abdomen is flat.     Palpations: Abdomen is soft.     Tenderness: There is no abdominal tenderness. There is no guarding or rebound.     Comments: G Tube in place  Musculoskeletal:     Right lower leg: No edema.     Left lower leg: No edema.  Skin:    General: Skin is warm and dry.  Neurological:     General: No focal deficit present.     Mental Status: He is alert and oriented to person, place, and time. Mental status is at baseline.  Psychiatric:        Mood and Affect: Mood normal.        Behavior: Behavior normal.     Filed Weights   09/23/20 0323 09/24/20 0409  09/25/20 0300  Weight: 61.3 kg 62.4 kg 61.8 kg     Intake/Output Summary (Last 24 hours) at 09/27/2020 0718 Last data filed at 09/27/2020 0600 Gross per 24 hour  Intake 2037 ml  Output 800 ml  Net 1237 ml   Net IO Since Admission: -2,913 mL [09/27/20 0718]  Pertinent Labs: CBC Latest Ref Rng & Units 09/27/2020 09/26/2020 09/25/2020  WBC 4.0 - 10.5 K/uL 8.7 8.3 10.3  Hemoglobin 13.0 - 17.0 g/dL 10.8(L) 11.6(L) 11.3(L)  Hematocrit 39.0 - 52.0 % 33.6(L) 33.8(L) 34.4(L)  Platelets 150 - 400 K/uL 194 194 178    CMP Latest Ref Rng & Units 09/27/2020 09/26/2020 09/25/2020  Glucose 70 - 99 mg/dL 170(H) 120(H) 104(H)  BUN 8 - 23 mg/dL 89(H) 91(H) 91(H)  Creatinine 0.61 - 1.24 mg/dL 2.05(H) 2.03(H) 2.05(H)  Sodium 135 - 145 mmol/L 134(L) 134(L) 133(L)  Potassium 3.5 - 5.1 mmol/L 3.6 3.8 3.9  Chloride 98 - 111 mmol/L 93(L) 93(L) 91(L)  CO2 22 - 32 mmol/L 29 28 31   Calcium 8.9 - 10.3 mg/dL  9.6 9.4 9.4  Total Protein 6.5 - 8.1 g/dL - - -  Total Bilirubin 0.3 - 1.2 mg/dL - - -  Alkaline Phos 38 - 126 U/L - - -  AST 15 - 41 U/L - - -  ALT 0 - 44 U/L - - -    Imaging: No results found.  Assessment/Plan:   Principal Problem:   Hyponatremia Active Problems:   Type 2 diabetes mellitus with peripheral vascular disease (HCC)   Chronic systolic congestive heart failure (HCC)   [name] (chronic kidney disease) stage 3, GFR 30-59 ml/min (HCC)   Pharyngeal dysphagia   Patient Summary: [name] is 87yo male with chronic systolic and diastolic heart failure (05/9241 EF 68-34%, grade I diastolic dysfunction) 2/2 ischemic cardiomyopathy s/p ICD placement (2005), hypertension, CKDIII, PAD s/p L carotid stent (2004), throat cancer s/p radiation, type II diabetes mellitus admitted 12/29 for evaluation and work-up of hyponatremia.  #Hyponatremia Sodium 134>134 this am. Initial osmolality was unremarkable, uncertain as to underlying etiology of hyponatremia however he improved with limiting free water  despite inconsistent osmolality. Patient to be discharged home with [facility] w/ 24 hr supervision. Educated patient on tube feeds and limiting free water.  - [facility] to set up home PT today, if able to do this today, patient can be discharged home   #[name] IIIB/IV #Hypertension Baseline Cr of 1.6-1.8. Cr of 2.05 today, no improvement from yesterday, held home lasix 09/25/20. Thought to be secondary to diureses w/ decrease fluid intake. BUN of 89, appears to be pre-renal in nature. Suspect resolution once free water is continued once hyponatremia resolved. - Continue Coreg - Continue to hold lasix  #Chronic systolic, diastolic heart failure, s/p ICD Appears euvolemic this morning, would recommend patient continue to hold lasix at this time.  -Continue coreg, hold lasix -I and Os -BMP daily  Diet: Tube Feeds IVF: None,None VTE: SCDs Code: Full PT/OT recs: Home Health, 24 hr supervision  Dispo: Anticipated discharge to Home pending ride home via [place] Internal Medicine Resident PGY-1 Pager [phone] Please contact the on call pager after 5 pm and on weekends at [phone].

## 2020-09-27 NOTE — TOC Progression Note (Addendum)
Transition of Care [facility]) - Progression Note    Patient Details  Name: [name] MRN: [id number] Date of Birth: 1931/03/25  Transition of Care [facility]) CM/SW Contact  [name], RN Phone Number: 09/27/2020, 12:19 PM  Clinical Narrative:    NCM received call from Triage Nurse at [facility] , she states they are not open and she can not look up anything but will send a message in and get back to this NCM  About patient's tube feeds.   12:30 -NCM received call from Bayard the [place] at [facility], she states patient was doing his own bolus feeds at home and [facility] supplies the tube feed for him.  He lives with his son, patient states son will be there when he goes home.  CSW is checking to see if they can provide transportation for patient today if discharged. [facility] will not be able to provide transportation for patient if he is dc today, he will have to have his family provide transportation for him.        Expected Discharge Plan and Services                                                 Social Determinants of Health (SDOH) Interventions    Readmission Risk Interventions No flowsheet data found.

## 2020-09-27 NOTE — Care Management Important Message (Signed)
Important Message  Patient Details  Name: [name] MRN: [id number] Date of Birth: 10/10/1930   Medicare Important Message Given:  Yes     Shelda Altes 09/27/2020, 9:12 AM

## 2020-09-27 NOTE — Progress Notes (Signed)
No ICM remote transmission received for 09/27/2020 and next ICM transmission scheduled for 10/04/2020.

## 2020-09-27 NOTE — Plan of Care (Signed)
  Problem: Education: Goal: Knowledge of General Education information will improve Description: Including pain rating scale, medication(s)/side effects and non-pharmacologic comfort measures 09/27/2020 0515 by Florestine Avers, RN Outcome: Progressing 09/27/2020 0515 by Florestine Avers, RN Outcome: Progressing   Problem: Health Behavior/Discharge Planning: Goal: Ability to manage health-related needs will improve 09/27/2020 0515 by Florestine Avers, RN Outcome: Progressing 09/27/2020 0515 by Florestine Avers, RN Outcome: Progressing   Problem: Clinical Measurements: Goal: Ability to maintain clinical measurements within normal limits will improve 09/27/2020 0515 by Florestine Avers, RN Outcome: Progressing 09/27/2020 0515 by Florestine Avers, RN Outcome: Progressing Goal: Will remain free from infection 09/27/2020 0515 by Florestine Avers, RN Outcome: Progressing 09/27/2020 0515 by Florestine Avers, RN Outcome: Progressing Goal: Diagnostic test results will improve 09/27/2020 0515 by Florestine Avers, RN Outcome: Progressing 09/27/2020 0515 by Florestine Avers, RN Outcome: Progressing Goal: Respiratory complications will improve 09/27/2020 0515 by Florestine Avers, RN Outcome: Progressing 09/27/2020 0515 by Florestine Avers, RN Outcome: Progressing Goal: Cardiovascular complication will be avoided 09/27/2020 0515 by Florestine Avers, RN Outcome: Progressing 09/27/2020 0515 by Florestine Avers, RN Outcome: Progressing   Problem: Activity: Goal: Risk for activity intolerance will decrease 09/27/2020 0515 by Florestine Avers, RN Outcome: Progressing 09/27/2020 0515 by Florestine Avers, RN Outcome: Progressing   Problem: Nutrition: Goal: Adequate nutrition will be maintained 09/27/2020 0515 by Florestine Avers, RN Outcome: Progressing 09/27/2020 0515 by Florestine Avers, RN Outcome: Progressing   Problem: Coping: Goal: Level of anxiety will  decrease 09/27/2020 0515 by Florestine Avers, RN Outcome: Progressing 09/27/2020 0515 by Florestine Avers, RN Outcome: Progressing   Problem: Elimination: Goal: Will not experience complications related to bowel motility 09/27/2020 0515 by Florestine Avers, RN Outcome: Progressing 09/27/2020 0515 by Florestine Avers, RN Outcome: Progressing Goal: Will not experience complications related to urinary retention 09/27/2020 0515 by Florestine Avers, RN Outcome: Progressing 09/27/2020 0515 by Florestine Avers, RN Outcome: Progressing   Problem: Pain Managment: Goal: General experience of comfort will improve 09/27/2020 0515 by Florestine Avers, RN Outcome: Progressing 09/27/2020 0515 by Florestine Avers, RN Outcome: Progressing   Problem: Safety: Goal: Ability to remain free from injury will improve 09/27/2020 0515 by Florestine Avers, RN Outcome: Progressing 09/27/2020 0515 by Florestine Avers, RN Outcome: Progressing   Problem: Skin Integrity: Goal: Risk for impaired skin integrity will decrease 09/27/2020 0515 by Florestine Avers, RN Outcome: Progressing 09/27/2020 0515 by Florestine Avers, RN Outcome: Progressing   Problem: Education: Goal: Ability to demonstrate management of disease process will improve 09/27/2020 0515 by Florestine Avers, RN Outcome: Progressing 09/27/2020 0515 by Florestine Avers, RN Outcome: Progressing Goal: Ability to verbalize understanding of medication therapies will improve 09/27/2020 0515 by Florestine Avers, RN Outcome: Progressing 09/27/2020 0515 by Florestine Avers, RN Outcome: Progressing Goal: Individualized Educational Video(s) 09/27/2020 0515 by Florestine Avers, RN Outcome: Progressing 09/27/2020 0515 by Florestine Avers, RN Outcome: Progressing   Problem: Activity: Goal: Capacity to carry out activities will improve 09/27/2020 0515 by Florestine Avers, RN Outcome: Progressing 09/27/2020 0515 by Florestine Avers, RN Outcome: Progressing   Problem: Cardiac: Goal: Ability to achieve and maintain adequate cardiopulmonary perfusion will improve 09/27/2020 0515 by Florestine Avers, RN Outcome: Progressing 09/27/2020 0515 by Florestine Avers, RN Outcome: Progressing

## 2020-09-27 NOTE — Progress Notes (Signed)
Physical Therapy Treatment Patient Details Name: [name] MRN: [id number] DOB: 09-07-1931 Today's Date: 09/27/2020    History of Present Illness Pt is an 87 year old man admitted on 09/22/20 with nausea, weakness and spitting blood. + hyponatremia. PMH: CHF with EF 25-30%, ICD, HTN, [name] 3, carotid stent, throat ca, DM2, MI, PVD, percardial effusion, TIA.    PT Comments    Pt progressing towards his physical therapy goals; exhibiting improved activity tolerance and ambulation distance. Requiring min assist for transfers, ambulating 200 feet with a walker at a min guard assist level. D/c plan remains appropriate.     Follow Up Recommendations  Home health PT     Equipment Recommendations  None recommended by PT    Recommendations for Other Services       Precautions / Restrictions Precautions Precautions: Fall Precaution Comments: gtube fed Restrictions Weight Bearing Restrictions: No    Mobility  Bed Mobility Overal bed mobility: Needs Assistance Bed Mobility: Supine to Sit     Supine to sit: Min assist     General bed mobility comments: MinA to pull trunk to upright  Transfers Overall transfer level: Needs assistance Equipment used: Rolling walker (2 wheeled) Transfers: Sit to/from Stand Sit to Stand: Min assist         General transfer comment: Light minA to initially stabilize  Ambulation/Gait Ambulation/Gait assistance: Min guard Gait Distance (Feet): 200 Feet Assistive device: Rolling walker (2 wheeled) Gait Pattern/deviations: Step-through pattern;Decreased stride length;Decreased dorsiflexion - right;Decreased dorsiflexion - left;Shuffle Gait velocity: decreased   General Gait Details: Cues for walker proximity, shuffle gait pattern, min guard for safety   Stairs             Wheelchair Mobility    Modified Rankin (Stroke Patients Only)       Balance Overall balance assessment: Needs assistance Sitting-balance support: Feet  supported Sitting balance-Leahy Scale: Good     Standing balance support: Bilateral upper extremity supported;During functional activity Standing balance-Leahy Scale: Poor Standing balance comment: reliant on external support                            Cognition Arousal/Alertness: Awake/alert Behavior During Therapy: WFL for tasks assessed/performed Overall Cognitive Status: Difficult to assess                                 General Comments: WFL for basic mobility today      Exercises      General Comments        Pertinent Vitals/Pain Pain Assessment: Faces Faces Pain Scale: Hurts a little bit Pain Location: bilateral feet Pain Descriptors / Indicators: Sore Pain Intervention(s): Monitored during session    Home Living                      Prior Function            PT Goals (current goals can now be found in the care plan section) Acute Rehab PT Goals Patient Stated Goal: walk more Potential to Achieve Goals: Good Progress towards PT goals: Progressing toward goals    Frequency    Min 3X/week      PT Plan Current plan remains appropriate    Co-evaluation              AM-PAC PT "6 Clicks" Mobility   Outcome Measure  Help needed turning  from your back to your side while in a flat bed without using bedrails?: None Help needed moving from lying on your back to sitting on the side of a flat bed without using bedrails?: A Little Help needed moving to and from a bed to a chair (including a wheelchair)?: A Little Help needed standing up from a chair using your arms (e.g., wheelchair or bedside chair)?: A Little Help needed to walk in hospital room?: A Little Help needed climbing 3-5 steps with a railing? : A Lot 6 Click Score: 18    End of Session Equipment Utilized During Treatment: Gait belt Activity Tolerance: Patient tolerated treatment well Patient left: in chair;with call bell/phone within reach;with chair  alarm set Nurse Communication: Mobility status PT Visit Diagnosis: Unsteadiness on feet (R26.81);Muscle weakness (generalized) (M62.81);Difficulty in walking, not elsewhere classified (R26.2);History of falling (Z91.81)     Time: 4158-3094 PT Time Calculation (min) (ACUTE ONLY): 24 min  Charges:  $Gait Training: 8-22 mins $Therapeutic Activity: 8-22 mins                     Wyona Almas, PT, DPT Acute Rehabilitation Services Pager [phone] Office [phone]    [name] 09/27/2020, 10:45 AM

## 2020-09-27 NOTE — Progress Notes (Signed)
Remote ICD transmission.   

## 2020-09-28 ENCOUNTER — Telehealth: Payer: Self-pay

## 2020-09-28 DIAGNOSIS — I130 Hypertensive heart and chronic kidney disease with heart failure and stage 1 through stage 4 chronic kidney disease, or unspecified chronic kidney disease: Secondary | ICD-10-CM | POA: Diagnosis not present

## 2020-09-28 DIAGNOSIS — E871 Hypo-osmolality and hyponatremia: Secondary | ICD-10-CM | POA: Diagnosis not present

## 2020-09-28 DIAGNOSIS — I5042 Chronic combined systolic (congestive) and diastolic (congestive) heart failure: Secondary | ICD-10-CM | POA: Diagnosis not present

## 2020-09-28 DIAGNOSIS — N1832 Chronic kidney disease, stage 3b: Secondary | ICD-10-CM | POA: Diagnosis not present

## 2020-09-28 LAB — CBC
HCT: 34.4 % — ABNORMAL LOW (ref 39.0–52.0)
Hemoglobin: 11.7 g/dL — ABNORMAL LOW (ref 13.0–17.0)
MCH: 31.7 pg (ref 26.0–34.0)
MCHC: 34 g/dL (ref 30.0–36.0)
MCV: 93.2 fL (ref 80.0–100.0)
Platelets: 178 10*3/uL (ref 150–400)
RBC: 3.69 MIL/uL — ABNORMAL LOW (ref 4.22–5.81)
RDW: 13.6 % (ref 11.5–15.5)
WBC: 7.6 10*3/uL (ref 4.0–10.5)
nRBC: 0 % (ref 0.0–0.2)

## 2020-09-28 LAB — BASIC METABOLIC PANEL
Anion gap: 11 (ref 5–15)
BUN: 93 mg/dL — ABNORMAL HIGH (ref 8–23)
CO2: 28 mmol/L (ref 22–32)
Calcium: 9.7 mg/dL (ref 8.9–10.3)
Chloride: 95 mmol/L — ABNORMAL LOW (ref 98–111)
Creatinine, Ser: 2.01 mg/dL — ABNORMAL HIGH (ref 0.61–1.24)
GFR, Estimated: 32 mL/min — ABNORMAL LOW (ref >60)
Glucose, Bld: 100 mg/dL — ABNORMAL HIGH (ref 70–99)
Potassium: 3.6 mmol/L (ref 3.5–5.1)
Sodium: 134 mmol/L — ABNORMAL LOW (ref 135–145)

## 2020-09-28 LAB — GLUCOSE, CAPILLARY
Glucose-Capillary: 95 mg/dL (ref 70–99)
Glucose-Capillary: 107 mg/dL — ABNORMAL HIGH (ref 70–99)
Glucose-Capillary: 106 mg/dL — ABNORMAL HIGH (ref 70–99)
Glucose-Capillary: 155 mg/dL — ABNORMAL HIGH (ref 70–99)

## 2020-09-28 MED ORDER — CARVEDILOL 6.25 MG PO TABS: ORAL_TABLET | ORAL | 1 refills | Status: DC

## 2020-09-28 NOTE — Progress Notes (Signed)
HD#6 Subjective:  Overnight Events: No events   [name] was seen at bedside this AM. He states that he is doing well this AM and asked when he would be discharged. Discussed with him we plan to discharge him today with [facility] follow up as well as [facility] by [facility].   Objective:  Vital signs in last 24 hours: Vitals:   09/27/20 0809 09/27/20 1828 09/27/20 2001 09/28/20 0426  BP: (!) 100/53 (!) 107/59 131/65 (!) 100/59  Pulse: 67  75 71  Resp: (!) 21  16 18   Temp: 97.8 F (36.6 C)  98.1 F (36.7 C) 97.9 F (36.6 C)  TempSrc: Oral  Oral Oral  SpO2: 100%  99% 94%  Weight:    58.7 kg  Height:       Supplemental O2: Room Air SpO2: 94 %   Physical Exam:  Physical Exam Vitals and nursing note reviewed.  Constitutional:      General: He is not in acute distress.    Appearance: He is not ill-appearing.  HENT:     Head: Normocephalic and atraumatic.  Cardiovascular:     Rate and Rhythm: Normal rate and regular rhythm.     Pulses: Normal pulses.     Heart sounds: Murmur (2/6 systolic) heard.  No friction rub. No gallop.   Pulmonary:     Effort: Pulmonary effort is normal. No respiratory distress.     Breath sounds: Normal breath sounds. No stridor. No wheezing, rhonchi or rales.  Abdominal:     General: Abdomen is flat.     Palpations: Abdomen is soft.     Tenderness: There is no abdominal tenderness. There is no guarding or rebound.     Comments: G Tube in place  Musculoskeletal:     Right lower leg: No edema.     Left lower leg: No edema.  Skin:    General: Skin is warm and dry.  Neurological:     General: No focal deficit present.     Mental Status: He is alert and oriented to person, place, and time. Mental status is at baseline.  Psychiatric:        Mood and Affect: Mood normal.        Behavior: Behavior normal.     Filed Weights   09/24/20 0409 09/25/20 0300 09/28/20 0426  Weight: 62.4 kg 61.8 kg 58.7 kg     Intake/Output Summary (Last 24  hours) at 09/28/2020 1105 Last data filed at 09/28/2020 0400 Gross per 24 hour  Intake 877 ml  Output 750 ml  Net 127 ml   Net IO Since Admission: -2,786 mL [09/28/20 1105]  Pertinent Labs: CBC Latest Ref Rng & Units 09/28/2020 09/27/2020 09/26/2020  WBC 4.0 - 10.5 K/uL 7.6 8.7 8.3  Hemoglobin 13.0 - 17.0 g/dL 11.7(L) 10.8(L) 11.6(L)  Hematocrit 39.0 - 52.0 % 34.4(L) 33.6(L) 33.8(L)  Platelets 150 - 400 K/uL 178 194 194    CMP Latest Ref Rng & Units 09/28/2020 09/27/2020 09/26/2020  Glucose 70 - 99 mg/dL 100(H) 170(H) 120(H)  BUN 8 - 23 mg/dL 93(H) 89(H) 91(H)  Creatinine 0.61 - 1.24 mg/dL 2.01(H) 2.05(H) 2.03(H)  Sodium 135 - 145 mmol/L 134(L) 134(L) 134(L)  Potassium 3.5 - 5.1 mmol/L 3.6 3.6 3.8  Chloride 98 - 111 mmol/L 95(L) 93(L) 93(L)  CO2 22 - 32 mmol/L 28 29 28   Calcium 8.9 - 10.3 mg/dL 9.7 9.6 9.4  Total Protein 6.5 - 8.1 g/dL - - -  Total Bilirubin 0.3 - 1.2 mg/dL - - -  Alkaline Phos 38 - 126 U/L - - -  AST 15 - 41 U/L - - -  ALT 0 - 44 U/L - - -    Imaging: No results found.  Assessment/Plan:   Principal Problem:   Hyponatremia Active Problems:   Type 2 diabetes mellitus with peripheral vascular disease (HCC)   Chronic systolic congestive heart failure (HCC)   [name] (chronic kidney disease) stage 3, GFR 30-59 ml/min (HCC)   Pharyngeal dysphagia   Patient Summary: [name] is 87yo male with chronic systolic and diastolic heart failure (04/5630 EF 49-70%, grade I diastolic dysfunction) 2/2 ischemic cardiomyopathy s/p ICD placement (2005), hypertension, CKDIII, PAD s/p L carotid stent (2004), throat cancer s/p radiation, type II diabetes mellitus admitted 12/29 for evaluation and work-up of hyponatremia.  #Hyponatremia Sodium 134>134 this am, stable. Patient to be discharged home with [facility] w/ 24 hr supervision. Educated patient on tube feeds and limiting free water. Will have close outpatient follow up.  - [facility] to set up home PT today, if able to do this today,  patient can be discharged home  #[name] IIIB/IV #Hypertension Baseline Cr of 1.6-1.8. Cr of 2.01 today. Will continue to hold lasix and have patient follow up in clinic.  - continue Coreg - hold lasix at discharge  #Chronic systolic, diastolic heart failure, s/p ICD Appears euvolemic this morning, would recommend patient continue to hold lasix at this time.  -continue coreg -I and Os -BMP daily  Diet: Tube Feeds IVF: None,None VTE: SCDs Code: Full PT/OT recs: Home Health, Home PT. Contacted case management who stated [facility] will set up home PT.   Dispo: Anticipated discharge to Home today.   Sanjuana Letters DO Internal Medicine Resident PGY-1 Pager [phone] Please contact the on call pager after 5 pm and on weekends at [phone].

## 2020-09-28 NOTE — Progress Notes (Signed)
D/C instructions reviewed with son over telephone per pt's request. No questions asked but encouraged to call with any concerns. Son stated he would be leaving for work by 2pm, CM notified.

## 2020-09-28 NOTE — Telephone Encounter (Signed)
Hospital TOC per Dr. Johnney Ou, discharge 09/28/2020, appt 10/04/2020.

## 2020-09-28 NOTE — Progress Notes (Incomplete)
HD#6 Subjective:  Overnight Events: No events   [name] was seen at bedside this AM. Spoke with his son on the phone at bedside. Discussed discharge today and transportation.  Objective:  Vital signs in last 24 hours: Vitals:   09/27/20 0809 09/27/20 1828 09/27/20 2001 09/28/20 0426  BP: (!) 100/53 (!) 107/59 131/65 (!) 100/59  Pulse: 67  75 71  Resp: (!) 21  16 18   Temp: 97.8 F (36.6 C)  98.1 F (36.7 C) 97.9 F (36.6 C)  TempSrc: Oral  Oral Oral  SpO2: 100%  99% 94%  Weight:    58.7 kg  Height:       Supplemental O2: Room Air SpO2: 94 %   Physical Exam:  Physical Exam Vitals and nursing note reviewed.  Constitutional:      General: He is not in acute distress.    Appearance: He is not ill-appearing.  HENT:     Head: Normocephalic and atraumatic.  Cardiovascular:     Rate and Rhythm: Normal rate and regular rhythm.     Pulses: Normal pulses.     Heart sounds: Murmur (2/6 systolic) heard.  No friction rub. No gallop.   Pulmonary:     Effort: Pulmonary effort is normal. No respiratory distress.     Breath sounds: Normal breath sounds. No stridor. No wheezing, rhonchi or rales.  Abdominal:     General: Abdomen is flat.     Palpations: Abdomen is soft.     Tenderness: There is no abdominal tenderness. There is no guarding or rebound.     Comments: G Tube in place  Musculoskeletal:     Right lower leg: No edema.     Left lower leg: No edema.  Skin:    General: Skin is warm and dry.  Neurological:     General: No focal deficit present.     Mental Status: He is alert and oriented to person, place, and time. Mental status is at baseline.  Psychiatric:        Mood and Affect: Mood normal.        Behavior: Behavior normal.     Filed Weights   09/24/20 0409 09/25/20 0300 09/28/20 0426  Weight: 62.4 kg 61.8 kg 58.7 kg     Intake/Output Summary (Last 24 hours) at 09/28/2020 3267 Last data filed at 09/28/2020 0400 Gross per 24 hour  Intake 877 ml   Output 750 ml  Net 127 ml   Net IO Since Admission: -2,786 mL [09/28/20 0822]  Pertinent Labs: CBC Latest Ref Rng & Units 09/28/2020 09/27/2020 09/26/2020  WBC 4.0 - 10.5 K/uL 7.6 8.7 8.3  Hemoglobin 13.0 - 17.0 g/dL 11.7(L) 10.8(L) 11.6(L)  Hematocrit 39.0 - 52.0 % 34.4(L) 33.6(L) 33.8(L)  Platelets 150 - 400 K/uL 178 194 194    CMP Latest Ref Rng & Units 09/28/2020 09/27/2020 09/26/2020  Glucose 70 - 99 mg/dL 100(H) 170(H) 120(H)  BUN 8 - 23 mg/dL 93(H) 89(H) 91(H)  Creatinine 0.61 - 1.24 mg/dL 2.01(H) 2.05(H) 2.03(H)  Sodium 135 - 145 mmol/L 134(L) 134(L) 134(L)  Potassium 3.5 - 5.1 mmol/L 3.6 3.6 3.8  Chloride 98 - 111 mmol/L 95(L) 93(L) 93(L)  CO2 22 - 32 mmol/L 28 29 28   Calcium 8.9 - 10.3 mg/dL 9.7 9.6 9.4  Total Protein 6.5 - 8.1 g/dL - - -  Total Bilirubin 0.3 - 1.2 mg/dL - - -  Alkaline Phos 38 - 126 U/L - - -  AST 15 - 41  U/L - - -  ALT 0 - 44 U/L - - -    Imaging: No results found.  Assessment/Plan:   Principal Problem:   Hyponatremia Active Problems:   Type 2 diabetes mellitus with peripheral vascular disease (HCC)   Chronic systolic congestive heart failure (HCC)   [name] (chronic kidney disease) stage 3, GFR 30-59 ml/min (HCC)   Pharyngeal dysphagia   Patient Summary: [name] is 87yo male with chronic systolic and diastolic heart failure (11/90 EF 33-00%, grade I diastolic dysfunction) 2/2 ischemic cardiomyopathy s/p ICD placement (2005), hypertension, CKDIII, PAD s/p L carotid stent (2004), throat cancer s/p radiation, type II diabetes mellitus admitted 12/29 for evaluation and work-up of hyponatremia.  #Hyponatremia Sodium [phone] this am. Initial osmolality was unremarkable, uncertain as to underlying etiology of hyponatremia however he improved with limiting free water despite inconsistent osmolality. Patient to be discharged home today with [facility] w/ 24 hr supervision. Educated patient on tube feeds and limiting free water.  - Home PT arranged per  [facility] - Discharge home today  #[name] IIIB/IV #Hypertension Baseline Cr of 1.6-1.8. Cr of 2.01 today, slight improvement from yesterday, held home lasix 09/25/20. Thought to be secondary to diureses w/ decrease fluid intake. BUN of 89, appears to be pre-renal in nature. Suspect resolution once free water is continued once hyponatremia resolved. - Continue Coreg - Continue to hold lasix  #Chronic systolic, diastolic heart failure, s/p ICD Appears euvolemic this morning, would recommend patient continue to hold lasix at this time.  -Continue coreg, hold lasix -I and Os -BMP daily  Diet: Tube Feeds IVF: None,None VTE: SCDs Code: Full PT/OT recs: Home Health, 24 hr supervision  Dispo: Anticipated discharge to Home today pending ride home via [facility] Resident PGY-1 Pager [phone] Please contact the on call pager after 5 pm and on weekends at [phone].

## 2020-09-28 NOTE — Plan of Care (Signed)

## 2020-09-28 NOTE — Plan of Care (Signed)
  Problem: Education: Goal: Knowledge of General Education information will improve Description Including pain rating scale, medication(s)/side effects and non-pharmacologic comfort measures Outcome: Adequate for Discharge   Problem: Health Behavior/Discharge Planning: Goal: Ability to manage health-related needs will improve Outcome: Adequate for Discharge   

## 2020-09-28 NOTE — Discharge Instructions (Addendum)
Thank you for allowing [place] to care for you today! We believe you had low sodium levels due to the amount of free water you were giving yourself. Please follow up with your primary care provider and with [facility] to further instruct you on your feeding tube management.  Please hold your lasix until you follow up in the clinic.   Hyponatremia Hyponatremia is when the amount of salt (sodium) in your blood is too low. When salt levels are low, your body may take in extra water. This can cause swelling throughout the body. The swelling often affects the brain. What are the causes? This condition may be caused by:  Certain medical problems or conditions.  Vomiting a lot.  Having watery poop (diarrhea) often.  Certain medicines or illegal drugs.  Not having enough water in the body (dehydration).  Drinking too much water.  Eating a diet that is low in salt.  Large burns on your body.  Too much sweating. What increases the risk? You are more likely to get this condition if you:  Have long-term (chronic) kidney disease.  Have heart failure.  Have a medical condition that causes you to have watery poop often.  Do very hard exercises.  Take medicines that affect the amount of salt is in your blood. What are the signs or symptoms? Symptoms of this condition include:  Headache.  Feeling like you may vomit (nausea).  Vomiting.  Being very tired (lethargic).  Muscle weakness and cramps.  Not wanting to eat as much as normal (loss of appetite).  Feeling weak or light-headed. Severe symptoms of this condition include:  Confusion.  Feeling restless (agitation).  Having a fast heart rate.  Passing out (fainting).  Seizures.  Coma. How is this treated? Treatment for this condition depends on the cause. Treatment may include:  Getting fluids through an IV tube that is put into one of your veins.  Taking medicines to fix the salt levels in your blood. If medicines are  causing the problem, your medicines will need to be changed.  Limiting how much water or fluid you take in.  Monitoring in the hospital to watch your symptoms. Follow these instructions at home:   Take over-the-counter and prescription medicines only as told by your doctor. Many medicines can make this condition worse. Talk with your doctor about any medicines that you are taking.  Eat and drink exactly as you are told by your doctor. ? Eat only the foods you are told to eat. ? Limit how much fluid you take.  Do not drink alcohol.  Keep all follow-up visits as told by your doctor. This is important. Contact a doctor if:  You feel more like you may vomit.  You feel more tired.  Your headache gets worse.  You feel more confused.  You feel weaker.  Your symptoms go away and then they come back.  You have trouble following the diet instructions. Get help right away if:  You have a seizure.  You pass out.  You keep having watery poop.  You keep vomiting. Summary  Hyponatremia is when the amount of salt in your blood is too low.  When salt levels are low, you can have swelling throughout the body. The swelling mostly affects the brain.  Treatment depends on the cause. Treatment may include getting IV fluids, medicines, or not drinking as much fluid. This information is not intended to replace advice given to you by your health care provider. Make sure you discuss  any questions you have with your health care provider. Document Revised: 11/28/2018 Document Reviewed: 08/15/2018 Elsevier Patient Education  [place].

## 2020-09-28 NOTE — TOC Transition Note (Signed)
Transition of Care [facility]) - CM/SW Discharge Note   Patient Details  Name: [name] MRN: [id number] Date of Birth: 04-May-1931  [facility] CM/SW Contact:  [name], RN Phone Number: 09/28/2020, 10:13 AM   Clinical Narrative:    Patient is for discharge today, NCM contacted [facility] with [facility] for transport and also contacted [place].  Awaiting call back for eta.    Final next level of care: [place] Barriers to Discharge: No Barriers Identified   Patient Goals and CMS Choice        Discharge Placement                       Discharge Plan and Services                                     Social Determinants of Health (SDOH) Interventions     Readmission Risk Interventions No flowsheet data found.

## 2020-09-28 NOTE — Progress Notes (Signed)
Occupational Therapy Treatment Patient Details Name: [name] MRN: [id number] DOB: 22-Jan-1931 Today's Date: 09/28/2020    History of present illness Pt is an 87 year old man admitted on 09/22/20 with nausea, weakness and spitting blood. + hyponatremia. PMH: CHF with EF 25-30%, ICD, HTN, [name] 3, carotid stent, throat ca, DM2, MI, PVD, percardial effusion, TIA.   OT comments  Pt making good progress with functional goals, is pleasant and cooperative. OT will continue to follow acutely  Follow Up Recommendations  Home health OT    Equipment Recommendations  None recommended by OT    Recommendations for Other Services      Precautions / Restrictions Precautions Precautions: Fall Precaution Comments: gtube fed Restrictions Weight Bearing Restrictions: No       Mobility Bed Mobility Overal bed mobility: Needs Assistance Bed Mobility: Supine to Sit;Sit to Supine     Supine to sit: Min assist Sit to supine: Min assist   General bed mobility comments: min A to elevate trunk, min A with LEs back onto bed  Transfers Overall transfer level: Needs assistance Equipment used: Rolling walker (2 wheeled) Transfers: Sit to/from Stand Sit to Stand: Min assist;Min guard Stand pivot transfers: Min guard            Balance Overall balance assessment: Needs assistance Sitting-balance support: Feet supported Sitting balance-Leahy Scale: Good     Standing balance support: Bilateral upper extremity supported;During functional activity Standing balance-Leahy Scale: Poor                             ADL either performed or assessed with clinical judgement   ADL Overall ADL's : Needs assistance/impaired Eating/Feeding: NPO   Grooming: Wash/dry hands;Wash/dry face;Set up;Supervision/safety               Lower Body Dressing: Sitting/lateral leans;Moderate assistance   Toilet Transfer: Min guard;Ambulation;RW;Comfort height toilet;Grab bars;Cueing for  safety   Toileting- Clothing Manipulation and Hygiene: Min guard;Sit to/from stand       Functional mobility during ADLs: Minimal assistance;Min guard;Rolling walker;Cueing for safety       Vision Patient Visual Report: No change from baseline     Perception     Praxis      Cognition Arousal/Alertness: Awake/alert Behavior During Therapy: WFL for tasks assessed/performed Overall Cognitive Status: Difficult to assess                                          Exercises     Shoulder Instructions       General Comments      Pertinent Vitals/ Pain       Pain Assessment: Faces Faces Pain Scale: Hurts a little bit Pain Location: B feet Pain Descriptors / Indicators: Sore Pain Intervention(s): Monitored during session;Repositioned  Home Living                                          Prior Functioning/Environment              Frequency  Min 2X/week        Progress Toward Goals  OT Goals(current goals can now be found in the care plan section)     Acute Rehab OT Goals Patient Stated Goal: walk better  Plan Discharge plan remains appropriate    Co-evaluation                 AM-PAC OT "6 Clicks" Daily Activity     Outcome Measure   Help from another person eating meals?: Total Help from another person taking care of personal grooming?: A Little Help from another person toileting, which includes using toliet, bedpan, or urinal?: A Little Help from another person bathing (including washing, rinsing, drying)?: A Little Help from another person to put on and taking off regular upper body clothing?: A Little Help from another person to put on and taking off regular lower body clothing?: A Lot 6 Click Score: 15    End of Session Equipment Utilized During Treatment: Rolling walker;Gait belt  OT Visit Diagnosis: Unsteadiness on feet (R26.81);Other abnormalities of gait and mobility (R26.89);Muscle weakness  (generalized) (M62.81)   Activity Tolerance Patient tolerated treatment well   Patient Left with call bell/phone within reach;in bed;with bed alarm set   Nurse Communication          Time: 1761-6073 OT Time Calculation (min): 16 min  Charges: OT General Charges $OT Visit: 1 Visit OT Treatments $Self Care/Home Management : 8-22 mins     Britt Bottom 09/28/2020, 3:55 PM

## 2020-09-29 ENCOUNTER — Ambulatory Visit: Payer: Medicare (Managed Care) | Admitting: Gastroenterology

## 2020-09-30 NOTE — Discharge Summary (Signed)
Name: [name] MRN: [id number] DOB: 04-19-1931 87 y.o. PCP: Axel Filler, MD  Date of Admission: 09/21/2020 11:47 PM Date of Discharge: 09/28/2020 Attending Physician: Dr. Angelia Mould  Discharge Diagnosis: Principal Problem:   Hyponatremia Active Problems:   Type 2 diabetes mellitus with peripheral vascular disease (HCC)   Chronic systolic congestive heart failure (HCC)   [name] (chronic kidney disease) stage 3, GFR 30-59 ml/min (HCC)   Pharyngeal dysphagia    Discharge Medications: Allergies as of 09/28/2020   No Known Allergies     Medication List    STOP taking these medications   doxycycline 50 MG capsule Commonly known as: MONODOX   furosemide 40 MG tablet Commonly known as: LASIX     TAKE these medications   Accu-Chek Aviva Plus test strip Generic drug: glucose blood Check blood sugar up to 3 times a day   Accu-Chek Aviva Plus w/Device Kit Check finger stick glucose once daily   aspirin EC 81 MG tablet Take 81 mg by mouth daily. Swallow whole.   atorvastatin 40 MG tablet Commonly known as: LIPITOR TAKE 1 TABLET(40 MG) BY MOUTH DAILY AT 6 PM What changed:   how much to take  how to take this  when to take this  additional instructions   carvedilol 6.25 MG tablet Commonly known as: COREG PLACE 1 TABLET INTO FEEDING TUBE TWICE DAILY WITH A MEAL What changed: Another medication with the same name was removed. Continue taking this medication, and follow the directions you see here.   feeding supplement (NEPRO CARB STEADY) Liqd Place 237 mLs into feeding tube 4 (four) times daily. What changed: how much to take   feeding supplement (PRO-STAT SUGAR FREE 64) Liqd Place 30 mLs into feeding tube daily.   insulin glargine 100 UNIT/ML injection Commonly known as: LANTUS Inject 0.08 mLs (8 Units total) into the skin daily.   Insulin Pen Needle 31G X 8 MM Misc Commonly known as: B-D ULTRAFINE III SHORT PEN USE ONCE DAILY WITH LANTUS  PEN   lansoprazole 30 MG disintegrating tablet Commonly known as: PREVACID SOLUTAB Take 30 mg by mouth daily at 12 noon.   liver oil-zinc oxide 40 % ointment Commonly known as: DESITIN Apply 1 application topically See admin instructions. Apply twice daily and as needed for irritation   polyethylene glycol 17 g packet Commonly known as: MIRALAX / GLYCOLAX Take 17 g by mouth daily.   polyvinyl alcohol 1.4 % ophthalmic solution Commonly known as: LIQUIFILM TEARS Place 1 drop into both eyes 4 (four) times daily as needed for dry eyes.   prochlorperazine 5 MG tablet Commonly known as: COMPAZINE Take 5 mg by mouth 3 (three) times daily as needed for nausea.   tamsulosin 0.4 MG Caps capsule Commonly known as: FLOMAX Take 0.8 mg by mouth at bedtime.   triamcinolone 0.025 % cream Commonly known as: KENALOG Apply 1 application topically 2 (two) times daily as needed (venous stasis dermatitis).   trimethoprim-polymyxin b ophthalmic solution Commonly known as: POLYTRIM Place 1 drop into both eyes 4 (four) times daily.   Ventolin HFA 108 (90 Base) MCG/ACT inhaler Generic drug: albuterol INHALE 2 PUFFS INTO THE LUNGS EVERY 6 HOURS AS NEEDED FOR WHEEZING OR SHORTNESS OF BREATH What changed: See the new instructions.            Discharge Care Instructions  (From admission, onward)         Start     Ordered   09/28/20 0000  Discharge  wound care:       Comments: Continue foam dressings on abrasions on LE.   09/28/20 0836          Disposition and follow-up:   [name] was discharged from [facility] in Good condition.  At the hospital follow up visit please address:  1.  Follow-up:  A. Hyponatremia    B. Deconditioning   C. Chronic Systolic/Diastolic HF   D. Epistaxis  2.  Labs / imaging needed at time of follow-up: BMP - Na/Kidney Function  3.  Pending labs/ test needing follow-up: None  4.  Medication Changes  Started:  None  Stopped: Lasix 40 mg BID  Abx - None  Follow-up Appointments:  Follow-up Information    Axel Filler, MD. Schedule an appointment as soon as possible for a visit.   Specialty: Internal Medicine Contact information: [place] [address] [address] [place] Hospital Course by problem list:    Hyponatremia Patient admitted for workup of nausea/weaknesss and found to be hyponatremic at Na of 114. Urine osmolality low 200. 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 Initially, his free water was held and he was given lasix for diuresis. Patient's sodium continued to trend upward and improve during his hospitalization. He was counseled multiple times during his hospitalization about proper free water use.   Deconditioning Patient evaluated by PT/OT for his weakness during his hospitalization who recommended home health PT/OT. Both were set up with assistance from case management and [facility].   Chronic Systolic/Diastolic HF Patient appeared hypervolemic upon initial evaluation. Lasix was used during his admission and held once euvolemic. Upon discharge home lasix held, to be further evaluated during post hospitalization follow up by PCP.  Epistaxis  Reported active bleeding prior to admission, none during hospitalization. Home aspirin initially held, continued at discharge. No obvious sign of tumor in nasopharynx, pharynx, or larynx per oncology note 12/21. If worsens on outpatient basis would consider outpatient ENT visit.   Discharge Vitals:   BP (!) 100/59 (BP Location: Left Arm)   Pulse 71   Temp 97.9 F (36.6 C) (Oral)   Resp 18   Ht $R'5\' 11"'bh$  (1.803 m)   Wt 58.7 kg   SpO2 94%   BMI 18.06 kg/m   Pertinent Labs, Studies, and Procedures:  CBC Latest Ref Rng & Units 09/28/2020 09/27/2020 09/26/2020  WBC 4.0 - 10.5  K/uL 7.6 8.7 8.3  Hemoglobin 13.0 - 17.0 g/dL 11.7(L) 10.8(L) 11.6(L)  Hematocrit 39.0 - 52.0 % 34.4(L) 33.6(L) 33.8(L)  Platelets 150 - 400 K/uL 178 194 194    CMP Latest Ref Rng & Units 09/28/2020 09/27/2020 09/26/2020  Glucose 70 - 99 mg/dL 100(H) 170(H) 120(H)  BUN 8 - 23 mg/dL 93(H) 89(H) 91(H)  Creatinine 0.61 - 1.24 mg/dL 2.01(H) 2.05(H) 2.03(H)  Sodium 135 - 145 mmol/L 134(L) 134(L) 134(L)  Potassium 3.5 - 5.1 mmol/L 3.6 3.6 3.8  Chloride 98 - 111 mmol/L 95(L) 93(L) 93(L)  CO2 22 - 32 mmol/L $RemoveB'28 29 28  'aPqbYbOr$ Calcium 8.9 - 10.3 mg/dL 9.7 9.6 9.4  Total Protein 6.5 - 8.1 g/dL - - -  Total Bilirubin 0.3 - 1.2 mg/dL - - -  Alkaline Phos 38 - 126 U/L - - -  AST 15 -  41 U/L - - -  ALT 0 - 44 U/L - - -    DG ABD ACUTE 2+V W 1V CHEST  Result Date: 09/22/2020 CLINICAL DATA:  Vomiting EXAM: DG ABDOMEN ACUTE WITH 1 VIEW CHEST COMPARISON:  None. FINDINGS: There is left retrocardiac consolidation and, likely, a small pleural effusion. Right lung is clear. There is no free intraperitoneal air. There is a percutaneous gastrostomy tube projects over the stomach. No dilated small bowel. IMPRESSION: 1. Left retrocardiac consolidation and small pleural effusion. This could indicate pneumonia. 2. No free intraperitoneal air or evidence of small bowel obstruction. Electronically Signed   By: Ulyses Jarred M.D.   On: 09/22/2020 03:51   US Abdomen Limited RUQ (LIVER/GB)  Result Date: 09/22/2020 CLINICAL DATA:  87 year old male with vomiting. EXAM: ULTRASOUND ABDOMEN LIMITED RIGHT UPPER QUADRANT COMPARISON:  CT Abdomen and Pelvis 02/11/2020. FINDINGS: Gallbladder: Chronic cholelithiasis. Stones individually estimated up to 17 mm. Partially contracted gallbladder. Gallbladder wall thickness at the upper limits of normal. No pericholecystic fluid. No sonographic Murphy sign elicited. Common bile duct: Diameter: 5-6 mm, normal. Liver: No focal lesion identified. Within normal limits in parenchymal echogenicity. Portal  vein is patent on color Doppler imaging with normal direction of blood flow towards the liver. Other: Negative visible right kidney, pancreas. IMPRESSION: Chronic cholelithiasis. No strong evidence of acute cholecystitis or bile duct obstruction. Electronically Signed   By: Genevie Ann M.D.   On: 09/22/2020 04:24     Discharge Instructions: Discharge Instructions    Diet - low sodium heart healthy   Complete by: As directed    Discharge instructions   Complete by: As directed    You were hospitalized for hyponatremia and bleeding from the nose. Thank you for allowing [place] to be part of your care.   Please follow up with the following providers at [facility].  Please note these changes made to your medications:   - Medications to continue: Please restart you furosemide 40 mg once daily and follow up with you [facility] doctors on how frequently you need to take this medication.   Please make sure to: Continue with your tube feedings 4 times daily. Please flush the feeding tube with 200 mL of water after each feed. Avoid putting excess water through the tube. You can drink small sips of thin liquids by mouth. Follow up with you outpatient speech therapist for further recommendations on what you can eat safely.   Please call our clinic if you have any questions or concerns, we may be able to help and keep you from a long and expensive emergency room wait. Our clinic and after hours phone number is [phone], the best time to call is Monday through Friday 9 am to 4 pm but there is always someone available 24/7 if you have an emergency. If you need medication refills please notify your pharmacy one week in advance and they will send [place] a request.   Discharge wound care:   Complete by: As directed    Continue foam dressings on abrasions on LE.   Increase activity slowly   Complete by: As directed       Signed: Riesa Pope, MD 10/03/2020, 1:46 PM   Pager: [phone]

## 2020-10-04 ENCOUNTER — Encounter: Payer: Medicare (Managed Care) | Admitting: Student

## 2020-10-04 NOTE — Progress Notes (Signed)
ICM remote transmission rescheduled for 11/08/2020.

## 2020-10-05 ENCOUNTER — Encounter: Payer: Medicare (Managed Care) | Admitting: Vascular Surgery

## 2020-10-05 ENCOUNTER — Encounter (HOSPITAL_COMMUNITY): Payer: Medicare (Managed Care)

## 2020-10-05 ENCOUNTER — Telehealth: Payer: Self-pay | Admitting: Physician Assistant

## 2020-10-05 NOTE — Telephone Encounter (Signed)
Called patient to discuss RRT (reached 10/05/20 Unable to leave message at home number.  left voice mail on his cell number with the DC direct number to call regarding his device, was non-urgent though to call back at his earliest convenience. Tommye Standard, PA-C  Message sent to device clinic for follow up Tommye Standard, PA-C

## 2020-10-06 ENCOUNTER — Telehealth: Payer: Self-pay

## 2020-10-06 ENCOUNTER — Ambulatory Visit (INDEPENDENT_AMBULATORY_CARE_PROVIDER_SITE_OTHER): Payer: Medicare (Managed Care)

## 2020-10-06 DIAGNOSIS — Z95810 Presence of automatic (implantable) cardiac defibrillator: Secondary | ICD-10-CM | POA: Diagnosis not present

## 2020-10-06 DIAGNOSIS — I5022 Chronic systolic (congestive) heart failure: Secondary | ICD-10-CM | POA: Diagnosis not present

## 2020-10-06 NOTE — Progress Notes (Signed)
EPIC Encounter for ICM Monitoring  Patient Name: [name] is a 87 y.o. male Date: 10/06/2020 Primary Care Physican: Axel Filler, MD Primary Cardiologist:Taylor Electrophysiologist: Santina Evans Pacing: 98.1% LastWeight:140lbs  Battery RRTon 10/05/2020.    Attempted call to patient and unable to reach.   Transmission reviewed.   OptivolThoracic impedancesuggesting possible fluid accumulation since 08/30/2020.  Tommye Standard, PA attempted call to patient regarding ERI and fluid accumulation and unable to reach.  Prescribed:Furosemide20 mgPlace 1 tablet (40 mg total) into feeding tube 2 (two) times daily.  Labs: 02/10/2020 Creatinine 2.19, BUN 101, Potassium 4.4, Sodium 138, GFR 26-30 12/08/2019 Creatinine 1.85, BUN 46, Potassium 4.9, Sodium 135, GFR 32-37 12/07/2019 Creatinine 1.76, BUN 45, Potassium 5.8, Sodium 136, GFR 34-40 12/06/2019 Creatinine 1.71, BUN 39, Potassium 5.8, Sodium 135, GFR 35-41 A complete set of results can be found in Results Review.  Recommendations:Unable to reach.    Follow-up plan: ICM clinic phone appointment on2/10/2020 to recheck fluid levels. 91 day device clinic remote transmission3/03/2021.  EP/Cardiology Office Visits:Recall for6/12/2022with Dr. Lovena Le.   Copy of ICM check sent to Dr.Taylor.   3 month ICM trend: 10/05/2020.    1 Year ICM trend:       [name], RN 10/06/2020 1:34 PM

## 2020-10-06 NOTE — Telephone Encounter (Signed)
Remote ICM transmission received.  Attempted call to patient regarding ICM remote transmission and no answer or voice mail option.  

## 2020-10-09 ENCOUNTER — Other Ambulatory Visit: Payer: Self-pay

## 2020-10-09 ENCOUNTER — Emergency Department (HOSPITAL_COMMUNITY): Payer: Medicare (Managed Care)

## 2020-10-09 ENCOUNTER — Observation Stay (HOSPITAL_COMMUNITY)
Admission: EM | Admit: 2020-10-09 | Discharge: 2020-10-11 | Disposition: A | Discharge status: Home / Self Care | Payer: Medicare (Managed Care) | Attending: Internal Medicine | Admitting: Internal Medicine

## 2020-10-09 ENCOUNTER — Encounter (HOSPITAL_COMMUNITY): Payer: Self-pay | Admitting: Emergency Medicine

## 2020-10-09 DIAGNOSIS — T85528A Displacement of other gastrointestinal prosthetic devices, implants and grafts, initial encounter: Secondary | ICD-10-CM

## 2020-10-09 DIAGNOSIS — E785 Hyperlipidemia, unspecified: Secondary | ICD-10-CM | POA: Insufficient documentation

## 2020-10-09 DIAGNOSIS — D72829 Elevated white blood cell count, unspecified: Secondary | ICD-10-CM | POA: Diagnosis not present

## 2020-10-09 DIAGNOSIS — R1310 Dysphagia, unspecified: Secondary | ICD-10-CM | POA: Diagnosis not present

## 2020-10-09 DIAGNOSIS — Z931 Gastrostomy status: Secondary | ICD-10-CM

## 2020-10-09 DIAGNOSIS — Z794 Long term (current) use of insulin: Secondary | ICD-10-CM | POA: Insufficient documentation

## 2020-10-09 DIAGNOSIS — J189 Pneumonia, unspecified organism: Secondary | ICD-10-CM

## 2020-10-09 DIAGNOSIS — N1832 Chronic kidney disease, stage 3b: Secondary | ICD-10-CM | POA: Insufficient documentation

## 2020-10-09 DIAGNOSIS — K9423 Gastrostomy malfunction: Principal | ICD-10-CM | POA: Insufficient documentation

## 2020-10-09 DIAGNOSIS — Z20822 Contact with and (suspected) exposure to covid-19: Secondary | ICD-10-CM | POA: Insufficient documentation

## 2020-10-09 DIAGNOSIS — E1151 Type 2 diabetes mellitus with diabetic peripheral angiopathy without gangrene: Secondary | ICD-10-CM

## 2020-10-09 DIAGNOSIS — Z95810 Presence of automatic (implantable) cardiac defibrillator: Secondary | ICD-10-CM | POA: Insufficient documentation

## 2020-10-09 DIAGNOSIS — Z833 Family history of diabetes mellitus: Secondary | ICD-10-CM | POA: Diagnosis not present

## 2020-10-09 DIAGNOSIS — Z8249 Family history of ischemic heart disease and other diseases of the circulatory system: Secondary | ICD-10-CM | POA: Insufficient documentation

## 2020-10-09 DIAGNOSIS — I5023 Acute on chronic systolic (congestive) heart failure: Secondary | ICD-10-CM | POA: Insufficient documentation

## 2020-10-09 DIAGNOSIS — D720 Genetic anomalies of leukocytes: Secondary | ICD-10-CM | POA: Insufficient documentation

## 2020-10-09 DIAGNOSIS — Z79899 Other long term (current) drug therapy: Secondary | ICD-10-CM | POA: Insufficient documentation

## 2020-10-09 DIAGNOSIS — R0902 Hypoxemia: Secondary | ICD-10-CM

## 2020-10-09 DIAGNOSIS — E1122 Type 2 diabetes mellitus with diabetic chronic kidney disease: Secondary | ICD-10-CM | POA: Diagnosis not present

## 2020-10-09 DIAGNOSIS — Z87891 Personal history of nicotine dependence: Secondary | ICD-10-CM | POA: Diagnosis not present

## 2020-10-09 DIAGNOSIS — I739 Peripheral vascular disease, unspecified: Secondary | ICD-10-CM | POA: Diagnosis not present

## 2020-10-09 DIAGNOSIS — I130 Hypertensive heart and chronic kidney disease with heart failure and stage 1 through stage 4 chronic kidney disease, or unspecified chronic kidney disease: Secondary | ICD-10-CM | POA: Diagnosis not present

## 2020-10-09 DIAGNOSIS — Z8521 Personal history of malignant neoplasm of larynx: Secondary | ICD-10-CM | POA: Diagnosis not present

## 2020-10-09 DIAGNOSIS — I509 Heart failure, unspecified: Secondary | ICD-10-CM

## 2020-10-09 DIAGNOSIS — J9601 Acute respiratory failure with hypoxia: Secondary | ICD-10-CM | POA: Diagnosis not present

## 2020-10-09 LAB — COMPREHENSIVE METABOLIC PANEL
ALT: 37 U/L (ref 0–44)
AST: 40 U/L (ref 15–41)
Albumin: 3.6 g/dL (ref 3.5–5.0)
Alkaline Phosphatase: 208 U/L — ABNORMAL HIGH (ref 38–126)
Anion gap: 9 (ref 5–15)
BUN: 152 mg/dL — ABNORMAL HIGH (ref 8–23)
CO2: 32 mmol/L (ref 22–32)
Calcium: 10.4 mg/dL — ABNORMAL HIGH (ref 8.9–10.3)
Chloride: 92 mmol/L — ABNORMAL LOW (ref 98–111)
Creatinine, Ser: 2.3 mg/dL — ABNORMAL HIGH (ref 0.61–1.24)
GFR, Estimated: 27 mL/min — ABNORMAL LOW (ref >60)
Glucose, Bld: 250 mg/dL — ABNORMAL HIGH (ref 70–99)
Potassium: 4.9 mmol/L (ref 3.5–5.1)
Sodium: 133 mmol/L — ABNORMAL LOW (ref 135–145)
Total Bilirubin: 0.4 mg/dL (ref 0.3–1.2)
Total Protein: 8 g/dL (ref 6.5–8.1)

## 2020-10-09 LAB — CBC WITH DIFFERENTIAL/PLATELET
Abs Immature Granulocytes: 0.05 10*3/uL (ref 0.00–0.07)
Basophils Absolute: 0.1 10*3/uL (ref 0.0–0.1)
Basophils Relative: 1 %
Eosinophils Absolute: 0.3 10*3/uL (ref 0.0–0.5)
Eosinophils Relative: 2 %
HCT: 42.7 % (ref 39.0–52.0)
Hemoglobin: 13.6 g/dL (ref 13.0–17.0)
Immature Granulocytes: 0 %
Lymphocytes Relative: 8 %
Lymphs Abs: 1.2 10*3/uL (ref 0.7–4.0)
MCH: 30.6 pg (ref 26.0–34.0)
MCHC: 31.9 g/dL (ref 30.0–36.0)
MCV: 96 fL (ref 80.0–100.0)
Monocytes Absolute: 1.4 10*3/uL — ABNORMAL HIGH (ref 0.1–1.0)
Monocytes Relative: 10 %
Neutro Abs: 11.1 10*3/uL — ABNORMAL HIGH (ref 1.7–7.7)
Neutrophils Relative %: 79 %
Platelets: 205 10*3/uL (ref 150–400)
RBC: 4.45 MIL/uL (ref 4.22–5.81)
RDW: 14.4 % (ref 11.5–15.5)
WBC: 14.1 10*3/uL — ABNORMAL HIGH (ref 4.0–10.5)
nRBC: 0 % (ref 0.0–0.2)

## 2020-10-09 LAB — BRAIN NATRIURETIC PEPTIDE: B Natriuretic Peptide: 671 pg/mL — ABNORMAL HIGH (ref 0.0–100.0)

## 2020-10-09 LAB — PROCALCITONIN: Procalcitonin: 0.13 ng/mL

## 2020-10-09 LAB — RESP PANEL BY RT-PCR (FLU A&B, COVID) ARPGX2
Influenza A by PCR: NEGATIVE
Influenza B by PCR: NEGATIVE
SARS Coronavirus 2 by RT PCR: NEGATIVE

## 2020-10-09 MED ORDER — IOHEXOL 300 MG/ML  SOLN
50.0000 mL | Freq: Once | INTRAMUSCULAR | Status: AC | PRN
  Administered 2020-10-09 – 2020-10-10 (×2): 50 mL

## 2020-10-09 MED ORDER — INSULIN ASPART 100 UNIT/ML ~~LOC~~ SOLN
0.0000 [IU] | SUBCUTANEOUS | Status: DC
  Administered 2020-10-10: 2 [IU] via SUBCUTANEOUS
  Administered 2020-10-10 (×2): 3 [IU] via SUBCUTANEOUS
  Administered 2020-10-10 (×2): 2 [IU] via SUBCUTANEOUS
  Administered 2020-10-10: 1 [IU] via SUBCUTANEOUS
  Administered 2020-10-11: 6 [IU] via SUBCUTANEOUS
  Administered 2020-10-11: 3 [IU] via SUBCUTANEOUS

## 2020-10-09 MED ORDER — ASPIRIN 81 MG PO CHEW
81.0000 mg | CHEWABLE_TABLET | Freq: Every day | ORAL | Status: DC
  Administered 2020-10-10: 81 mg
  Filled 2020-10-09: qty 1

## 2020-10-09 MED ORDER — SODIUM CHLORIDE 0.9 % IV SOLN
2.0000 g | Freq: Once | INTRAVENOUS | Status: AC
  Administered 2020-10-09: 2 g via INTRAVENOUS
  Filled 2020-10-09: qty 2

## 2020-10-09 MED ORDER — INSULIN GLARGINE 100 UNIT/ML ~~LOC~~ SOLN
4.0000 [IU] | Freq: Every day | SUBCUTANEOUS | Status: DC
  Administered 2020-10-10: 4 [IU] via SUBCUTANEOUS
  Filled 2020-10-09 (×2): qty 0.04

## 2020-10-09 MED ORDER — FUROSEMIDE 10 MG/ML IJ SOLN
40.0000 mg | Freq: Once | INTRAMUSCULAR | Status: AC
  Administered 2020-10-10: 40 mg via INTRAVENOUS
  Filled 2020-10-09: qty 4

## 2020-10-09 MED ORDER — CARVEDILOL 6.25 MG PO TABS
6.2500 mg | ORAL_TABLET | Freq: Two times a day (BID) | ORAL | Status: DC
  Administered 2020-10-10 (×2): 6.25 mg
  Filled 2020-10-09 (×2): qty 1

## 2020-10-09 MED ORDER — ALBUTEROL SULFATE HFA 108 (90 BASE) MCG/ACT IN AERS: 2.0000 | INHALATION_SPRAY | Freq: Four times a day (QID) | RESPIRATORY_TRACT | Status: DC | PRN

## 2020-10-09 MED ORDER — IOHEXOL 300 MG/ML  SOLN: 50.0000 mL | Freq: Once | INTRAMUSCULAR | Status: DC | PRN

## 2020-10-09 MED ORDER — OSMOLITE 1.2 CAL PO LIQD
1000.0000 mL | ORAL | Status: DC
  Administered 2020-10-10: 1000 mL
  Filled 2020-10-09 (×2): qty 1000

## 2020-10-09 MED ORDER — VANCOMYCIN HCL IN DEXTROSE 1-5 GM/200ML-% IV SOLN: 1000.0000 mg | INTRAVENOUS | Status: DC

## 2020-10-09 MED ORDER — ATORVASTATIN CALCIUM 40 MG PO TABS
40.0000 mg | ORAL_TABLET | Freq: Every day | ORAL | Status: DC
  Administered 2020-10-10: 40 mg
  Filled 2020-10-09: qty 1

## 2020-10-09 MED ORDER — VANCOMYCIN HCL 1250 MG/250ML IV SOLN
1250.0000 mg | Freq: Once | INTRAVENOUS | Status: AC
  Administered 2020-10-09: 1250 mg via INTRAVENOUS
  Filled 2020-10-09: qty 250

## 2020-10-09 MED ORDER — SODIUM CHLORIDE 0.9% FLUSH
3.0000 mL | Freq: Two times a day (BID) | INTRAVENOUS | Status: DC
  Administered 2020-10-10 (×3): 3 mL via INTRAVENOUS

## 2020-10-09 MED ORDER — TAMSULOSIN HCL 0.4 MG PO CAPS
0.8000 mg | ORAL_CAPSULE | Freq: Every day | ORAL | Status: DC
  Administered 2020-10-10: 0.8 mg via ORAL
  Filled 2020-10-09: qty 2

## 2020-10-09 MED ORDER — ENOXAPARIN SODIUM 30 MG/0.3ML ~~LOC~~ SOLN
30.0000 mg | Freq: Every day | SUBCUTANEOUS | Status: DC
  Administered 2020-10-10: 30 mg via SUBCUTANEOUS
  Filled 2020-10-09: qty 0.3

## 2020-10-09 NOTE — ED Provider Notes (Signed)
[facility] EMERGENCY DEPARTMENT Provider Note  CSN: [id number] Arrival date & time: 10/09/20 0941    History Chief Complaint  Patient presents with  . G-tube broke    HPI  [name] is a 87 y.o. male with history of laryngeal cancer had a PEG placed in Mar 2021. States he uses it for all nutrition and fluids and has never had it changed. Recently he has had trouble with the cap breaking off and the clamp no longer closing.   Past Medical History:  Diagnosis Date  . Adenomatous colon polyp 02/14/2012  . AICD (automatic cardioverter/defibrillator) present    Medtronic   . BBB (bundle branch block)    right  . Carotid stenosis    a. s/p L carotid stent 2004;  b. Carotid US (09/2014): Bilateral ICA 1-39%, left ECA >59%, normal subclavian bilaterally, occluded left vertebral >> FU 2 years  . Chronic kidney disease   . Chronic systolic CHF (congestive heart failure) (HCC)    a. ischemic CM EF 15-20%;  b. s/p AICD 05/24/04;  c. Echo 7/06: EF 30-40%, mild reduced RVSF, d. Echo 12/2015 EF 35-40%  . Elevated PSA   . History of kidney stones    x 1  . HTN (hypertension)   . Hyperlipidemia   . Hyponatremia 09/22/2020  . ICD (implantable cardiac defibrillator) in place 12-25-2012   MDT CRTD upgrade by Dr Lovena Le  . Myocardial infarction ([place]) 1990  . Pericardial effusion    Echocardiogram (09/2014): EF 25% with distal anterior, distal inferior, distal lateral and apical akinesis, grade 1 diastolic dysfunction, very mild aortic stenosis (mean 7 mmHg) - this may be depressed due to low EF (2-D images suggest mild to moderate aortic stenosis), large pericardial effusion, no RA collapse  . Pneumonia   . Presence of permanent cardiac pacemaker    Medtronic  . PVD (peripheral vascular disease) ([place])    s/p L carotid PTCA/stent 2004  . Transient ischemic attack   . Type II diabetes mellitus ([place])    type II    Past Surgical History:  Procedure Laterality Date  . BI-VENTRICULAR  IMPLANTABLE CARDIOVERTER DEFIBRILLATOR UPGRADE N/A 12/25/2012   Procedure: BI-VENTRICULAR IMPLANTABLE CARDIOVERTER DEFIBRILLATOR UPGRADE;  Surgeon: Evans Lance, MD;  Location: [facility] CATH LAB;  Service: Cardiovascular;  Laterality: N/A;  . BIV ICD UPGRADE  12/25/2012   MDT CRTD upgrade by Dr Lovena Le for ischemic cardiomyopathy and worsening conduction system disease  . CARDIAC CATHETERIZATION  06/2003,  01/2004  . CARDIAC CATHETERIZATION N/A 01/13/2016   Procedure: Left Heart Cath and Coronary Angiography;  Surgeon: Jettie Booze, MD;  Location: [facility] CV LAB;  Service: Cardiovascular;  Laterality: N/A;  . CARDIAC DEFIBRILLATOR PLACEMENT  05/24/2004   Implantation of a MDT single-chamber defibrillator  . CAROTID STENT  09/11/2003   Percutaneous transluminal angioplasty and stent placement of the left internal carotid artery.  Marland Kitchen CATARACT EXTRACTION W/ INTRAOCULAR LENS  IMPLANT, BILATERAL Bilateral ~ 2010  . CORONARY ANGIOPLASTY WITH STENT PLACEMENT  1990   "2" (12/25/2012)  . CYSTOSCOPY    . DIRECT LARYNGOSCOPY N/A 08/15/2019   Procedure: MICRODIRECT LARYNGOSCOPY WITH BIOPSY;  Surgeon: Melida Quitter, MD;  Location: [facility];  Service: ENT;  Laterality: N/A;  . ESOPHAGOGASTRODUODENOSCOPY (EGD) WITH PROPOFOL N/A 11/22/2019   Procedure: ESOPHAGOGASTRODUODENOSCOPY (EGD) WITH PROPOFOL with possible dilation;  Surgeon: Clarene Essex, MD;  Location: [facility];  Service: Endoscopy;  Laterality: N/A;  . INSERT / REPLACE / REMOVE PACEMAKER    .  IR GASTROSTOMY TUBE MOD SED  11/27/2019  . LEAD REVISION N/A 12/25/2012   Procedure: LEAD REVISION;  Surgeon: Marinus Maw, MD;  Location: [facility] CATH LAB;  Service: Cardiovascular;  Laterality: N/A;  . RIGID ESOPHAGOSCOPY N/A 08/15/2019   Procedure: RIGID ESOPHAGOSCOPY;  Surgeon: Christia [facility], MD;  Location: [facility] OR;  Service: ENT;  Laterality: N/A;    Family History  Problem Relation Age of Onset  . Diabetes Mother   . Healthy Father   . Diabetes Brother   .  Heart attack Neg Hx   . Stroke Neg Hx     Social History   Tobacco Use  . Smoking status: Former Smoker    Types: Cigarettes    Quit date: 12/27/1967    Years since quitting: 52.8  . Smokeless tobacco: Never Used  Vaping Use  . Vaping Use: Never used  Substance Use Topics  . Alcohol use: No    Alcohol/week: 0.0 standard drinks    Comment: 12/25/2012 "quit all alcohol 60 yr ago"  . Drug use: No     Home Medications Prior to Admission medications   Medication Sig Start Date End Date Taking? Authorizing Provider  Amino Acids-Protein Hydrolys (FEEDING SUPPLEMENT, PRO-STAT SUGAR FREE 64,) LIQD Place 30 mLs into feeding tube daily. 11/30/19   Seawell, Jaimie A, DO  aspirin EC 81 MG tablet Take 81 mg by mouth daily. Swallow whole.    [provider]  atorvastatin (LIPITOR) 40 MG tablet TAKE 1 TABLET(40 MG) BY MOUTH DAILY AT 6 PM Patient taking differently: Take 40 mg by mouth daily. 03/03/19   Tyson Alias, MD  Blood Glucose Monitoring Suppl (ACCU-CHEK AVIVA PLUS) w/Device KIT Check finger stick glucose once daily 05/06/18   Tyson Alias, MD  carvedilol (COREG) 6.25 MG tablet PLACE 1 TABLET INTO FEEDING TUBE TWICE DAILY WITH A MEAL 09/28/20   Katsadouros, Vasilios, MD  glucose blood (ACCU-CHEK AVIVA PLUS) test strip Check blood sugar up to 3 times a day 09/15/19   Tyson Alias, MD  insulin glargine (LANTUS) 100 UNIT/ML injection Inject 0.08 mLs (8 Units total) into the skin daily. 12/04/19   Katherine Roan, MD  Insulin Pen Needle (B-D ULTRAFINE III SHORT PEN) 31G X 8 MM MISC USE ONCE DAILY WITH LANTUS PEN 05/06/18   Tyson Alias, MD  lansoprazole (PREVACID SOLUTAB) 30 MG disintegrating tablet Take 30 mg by mouth daily at 12 noon.    [provider]  liver oil-zinc oxide (DESITIN) 40 % ointment Apply 1 application topically See admin instructions. Apply twice daily and as needed for irritation    [provider]  Nutritional  Supplements (FEEDING SUPPLEMENT, NEPRO CARB STEADY,) LIQD Place 237 mLs into feeding tube 4 (four) times daily. Patient taking differently: Place 240 mLs into feeding tube 4 (four) times daily. 12/08/19   Christian, Rylee, MD  polyethylene glycol (MIRALAX / GLYCOLAX) 17 g packet Take 17 g by mouth daily. 02/16/20   Moshe Cipro, NP  polyvinyl alcohol (LIQUIFILM TEARS) 1.4 % ophthalmic solution Place 1 drop into both eyes 4 (four) times daily as needed for dry eyes.    [provider]  prochlorperazine (COMPAZINE) 5 MG tablet Take 5 mg by mouth 3 (three) times daily as needed for nausea.    [provider]  tamsulosin (FLOMAX) 0.4 MG CAPS capsule Take 0.8 mg by mouth at bedtime.     [provider]  triamcinolone (KENALOG) 0.025 % cream Apply 1 application topically 2 (two) times  daily as needed (venous stasis dermatitis).    [provider]  trimethoprim-polymyxin b (POLYTRIM) ophthalmic solution Place 1 drop into both eyes 4 (four) times daily. 09/10/20   [provider]  VENTOLIN HFA 108 (90 Base) MCG/ACT inhaler INHALE 2 PUFFS INTO THE LUNGS EVERY 6 HOURS AS NEEDED FOR WHEEZING OR SHORTNESS OF BREATH Patient taking differently: Inhale 2 puffs into the lungs every 6 (six) hours as needed for wheezing or shortness of breath. 03/15/16   Axel Filler, MD     Allergies    Patient has no known allergies.   Review of Systems   Review of Systems A comprehensive review of systems was completed and negative except as noted in HPI.    Physical Exam BP 132/68 (BP Location: Right Arm)   Pulse 78   Temp 97.9 F (36.6 C) (Oral)   Resp 16   SpO2 100%   Physical Exam Vitals and nursing note reviewed.  HENT:     Head: Normocephalic.     Nose: Nose normal.  Eyes:     Extraocular Movements: Extraocular movements intact.  Pulmonary:     Effort: Pulmonary effort is normal.  Abdominal:     Comments: PEG in place, old plastic is  deteriorating, appears clogged.   Musculoskeletal:        General: Normal range of motion.     Cervical back: Neck supple.  Skin:    Findings: No rash (on exposed skin).  Neurological:     Mental Status: He is alert and oriented to person, place, and time.  Psychiatric:        Mood and Affect: Mood normal.      ED Results / Procedures / Treatments   Labs (all labs ordered are listed, but only abnormal results are displayed) Labs Reviewed - No data to display  EKG None  Radiology DG ABDOMEN PEG TUBE LOCATION  Result Date: 10/09/2020 CLINICAL DATA:  Peg tube placement check. EXAM: ABDOMEN - 1 VIEW COMPARISON:  Abdominal radiograph 07/07/2020 FINDINGS: Single frontal radiograph obtained following administration of contrast through the patient's PEG tube. The PEG tube is located over the central abdomen. There is contrast within the stomach lumen. No evidence of extraluminal contrast. Nonobstructive bowel gas pattern. IMPRESSION: Contrast appropriately fills the stomach following injection through the patient's PEG tube. Electronically Signed   By: Audie Pinto M.D.   On: 10/09/2020 15:05    Procedures Gastrostomy tube replacement  Date/Time: 10/09/2020 2:39 PM Performed by: Truddie Hidden, MD Authorized by: Truddie Hidden, MD  Consent: Verbal consent obtained. Consent given by: patient Patient identity confirmed: verbally with patient Time out: Immediately prior to procedure a "time out" was called to verify the correct patient, procedure, equipment, support staff and site/side marked as required. Local anesthesia used: no  Anesthesia: Local anesthesia used: no  Sedation: Patient sedated: no  Patient tolerance: patient tolerated the procedure well with no immediate complications Comments: Old PEG removed without difficulty, leaking of stomach contents and small amount of blood from stoma after removal (initial PEG removed via traction, no balloon to deflate).  Replacement 20Fr G tube placed through the stoma without difficulty, balloon inflated and bumper secured at skin.     Medications Ordered in the ED Medications  iohexol (OMNIPAQUE) 300 MG/ML solution 50 mL (50 mLs Per Tube Contrast Given 10/09/20 1459)     MDM Rules/Calculators/A&P MDM  ED Course  I have reviewed the triage vital signs and the nursing notes.  Pertinent  labs & imaging results that were available during my care of the patient were reviewed by me and considered in my medical decision making (see chart for details).  Clinical Course as of 10/09/20 1511  Sat Oct 09, 2020  1442 G tube replaced. I've asked RN to place a dressing and flush the tube. Will send to Xray for confirmatory imaging.  [CS]  1510 Xray confirms proper placement. Will d/c home.  [CS]    Clinical Course User Index [CS] Truddie Hidden, MD    Final Clinical Impression(s) / ED Diagnoses Final diagnoses:  Gastrostomy malfunction [facility])    Rx / DC Orders ED Discharge Orders    None       Truddie Hidden, MD 10/09/20 1511

## 2020-10-09 NOTE — H&P (Incomplete)
Date: 10/09/2020               Patient Name:  [name] MRN: [id number]  DOB: 1931-06-11 Age / Sex: 87 y.o., male   PCP: Axel Filler, MD         Medical Service: Internal Medicine Teaching Service         Attending Physician: Dr. Rex Kras, Wenda Overland, MD    First Contact: Dr. Marland Kitchen Pager: [phone]  Second Contact: Dr. Marland Kitchen Pager: [phone]       After Hours (After 5p/  First Contact Pager: [phone]  weekends / holidays): Second Contact Pager: [phone]   Chief Complaint: Cough  History of Present Illness:   [name] is an 87 y.o. underweight gentleman w/ PMHx pharyngeal carcinoma being treated with radiation therapy complicated by pharyngeal dysphagia, combined systolic and diastolic heart failure (EF 51-76%, grade I diastolic dysfunction) 2/2 ICM s/p ICD placement in 2005, HTN, Type II DM, [name] stage III, PAD s/p L carotid stent in 2004, who presented to the ED for G tube dysfunction although is being admitted due to cough and hypoxia. The patient states he only came to the hospital because he was having troubles with his G-tube and wanted it replaced. He denies any nausea, vomiting, pain at his G tube site, or changes in bowel movements. He does note a cough that he states started in the emergency department. He has been coughing up yellow/green sputum. He notes he only had 1 COVID vaccination so far. Denies any fevers, chills, light-headedness, CP, palpitations, increase in swelling, sick contacts, or any other symptoms.   Medications Prior to Admission: Feeding supplement, Pro-stat sugar free - 34mL daily ASA 81mg  daily  Atorvastatin 40mg  daily Coreg 6.25mg  twice daily     Allergies: Allergies as of 10/09/2020  . (No Known Allergies)   Past Medical History:  Diagnosis Date  . Adenomatous colon polyp 02/14/2012  . AICD (automatic cardioverter/defibrillator) present    Medtronic   . BBB (bundle branch block)    right  . Carotid stenosis    a. s/p L carotid  stent 2004;  b. Carotid US (09/2014): Bilateral ICA 1-39%, left ECA >59%, normal subclavian bilaterally, occluded left vertebral >> FU 2 years  . Chronic kidney disease   . Chronic systolic CHF (congestive heart failure) (HCC)    a. ischemic CM EF 15-20%;  b. s/p AICD 05/24/04;  c. Echo 7/06: EF 30-40%, mild reduced RVSF, d. Echo 12/2015 EF 35-40%  . Elevated PSA   . History of kidney stones    x 1  . HTN (hypertension)   . Hyperlipidemia   . Hyponatremia 09/22/2020  . ICD (implantable cardiac defibrillator) in place 12-25-2012   MDT CRTD upgrade by Dr Lovena Le  . Myocardial infarction ([place]) 1990  . Pericardial effusion    Echocardiogram (09/2014): EF 25% with distal anterior, distal inferior, distal lateral and apical akinesis, grade 1 diastolic dysfunction, very mild aortic stenosis (mean 7 mmHg) - this may be depressed due to low EF (2-D images suggest mild to moderate aortic stenosis), large pericardial effusion, no RA collapse  . Pneumonia   . Presence of permanent cardiac pacemaker    Medtronic  . PVD (peripheral vascular disease) ([place])    s/p L carotid PTCA/stent 2004  . Transient ischemic attack   . Type II diabetes mellitus ([place])    type II    Family History:  Family History  Problem Relation Age of Onset  .  Diabetes Mother   . Healthy Father   . Diabetes Brother   . Heart attack Neg Hx   . Stroke Neg Hx    Social History:  ***  Review of Systems: A complete ROS was negative except as per HPI.   Physical Exam: Blood pressure 105/60, pulse 86, temperature 98.2 F (36.8 C), temperature source Oral, resp. rate 18, height 5\' 11"  (1.803 m), weight 58.7 kg, SpO2 99 %. General: Patient appears well. No acute distress. Eyes: Sclera non-icteric. No conjunctival injection.  HENT: Neck is supple. No nasal discharge. Respiratory: Lungs are CTA, bilaterally. No wheezes, rales, or rhonchi.  Cardiovascular: Regular rate and rhythm. No murmurs, rubs, or gallops. No lower extremity  edema. Abdominal: Soft and non-tender to palpation. Bowel sounds intact. No rebound or guarding. Skin: No lesions. No rashes.  Psych: Normal affect. Normal tone of voice.   1L 100%   EKG: personally reviewed my interpretation is***  CXR: personally reviewed my interpretation is mild congestive   Assessment & Plan by Problem: Active Problems:   * No active hospital problems. *   Dispo: Admit patient to {STATUS:3044014::"Observation with expected length of stay less than 2 midnights.","Inpatient with expected length of stay greater than 2 midnights."}  Signed: Jeralyn Bennett, MD 10/09/2020, 11:34 PM  Pager: [phone] After 5pm on weekdays and 1pm on weekends: On Call pager: [phone]

## 2020-10-09 NOTE — ED Notes (Signed)
Ptar called by Waneta Fitting unable to give time frame

## 2020-10-09 NOTE — H&P (Incomplete)
Date: 10/09/2020               Patient Name:  [name] MRN: [id number]  DOB: March 19, 1931 Age / Sex: 87 y.o., male   PCP: Axel Filler, MD         Medical Service: Internal Medicine Teaching Service         Attending Physician: Dr. Rex Kras, Wenda Overland, MD    First Contact: Dr. Marland Kitchen Pager: [phone]  Second Contact: Dr. Marland Kitchen Pager: [phone]       After Hours (After 5p/  First Contact Pager: [phone]  weekends / holidays): Second Contact Pager: [phone]   Chief Complaint: ***  History of Present Illness:   [name] is an 87 y.o. underweight gentleman w/ PMHx pharyngeal carcinoma being treated with radiation therapy complicated by pharyngeal dysphagia, combined systolic and diastolic heart failure (EF 09-32%, grade I diastolic dysfunction) 2/2 ICM s/p ICD placement in 2005, HTN, Type II DM, [name] stage III, PAD s/p L carotid stent in 2004, presenting with   Meds: *** No outpatient medications have been marked as taking for the 10/09/20 encounter [facility] Encounter).     Allergies: Allergies as of 10/09/2020  . (No Known Allergies)   Past Medical History:  Diagnosis Date  . Adenomatous colon polyp 02/14/2012  . AICD (automatic cardioverter/defibrillator) present    Medtronic   . BBB (bundle branch block)    right  . Carotid stenosis    a. s/p L carotid stent 2004;  b. Carotid US (09/2014): Bilateral ICA 1-39%, left ECA >59%, normal subclavian bilaterally, occluded left vertebral >> FU 2 years  . Chronic kidney disease   . Chronic systolic CHF (congestive heart failure) (HCC)    a. ischemic CM EF 15-20%;  b. s/p AICD 05/24/04;  c. Echo 7/06: EF 30-40%, mild reduced RVSF, d. Echo 12/2015 EF 35-40%  . Elevated PSA   . History of kidney stones    x 1  . HTN (hypertension)   . Hyperlipidemia   . Hyponatremia 09/22/2020  . ICD (implantable cardiac defibrillator) in place 12-25-2012   MDT CRTD upgrade by Dr Lovena Le  . Myocardial infarction ([place]) 1990  .  Pericardial effusion    Echocardiogram (09/2014): EF 25% with distal anterior, distal inferior, distal lateral and apical akinesis, grade 1 diastolic dysfunction, very mild aortic stenosis (mean 7 mmHg) - this may be depressed due to low EF (2-D images suggest mild to moderate aortic stenosis), large pericardial effusion, no RA collapse  . Pneumonia   . Presence of permanent cardiac pacemaker    Medtronic  . PVD (peripheral vascular disease) ([place])    s/p L carotid PTCA/stent 2004  . Transient ischemic attack   . Type II diabetes mellitus ([place])    type II    Family History:  Family History  Problem Relation Age of Onset  . Diabetes Mother   . Healthy Father   . Diabetes Brother   . Heart attack Neg Hx   . Stroke Neg Hx    Social History:  ***  Review of Systems: A complete ROS was negative except as per HPI.   Physical Exam: Blood pressure 132/65, pulse 87, temperature 98 F (36.7 C), temperature source Oral, resp. rate (!) 22, height 5\' 11"  (1.803 m), weight 58.7 kg, SpO2 99 %. ***  EKG: personally reviewed my interpretation is***  CXR: personally reviewed my interpretation is mild congestive   Assessment & Plan by Problem: Active Problems:   *  No active hospital problems. *   Dispo: Admit patient to {STATUS:3044014::"Observation with expected length of stay less than 2 midnights.","Inpatient with expected length of stay greater than 2 midnights."}  Signed: Jeralyn Bennett, MD 10/09/2020, 10:04 PM  Pager: [phone] After 5pm on weekdays and 1pm on weekends: On Call pager: [phone]

## 2020-10-09 NOTE — ED Triage Notes (Signed)
Pt to triage via [facility] from home.  Reports cap broken on end of g-tube since yesterday.

## 2020-10-09 NOTE — ED Notes (Signed)
hospitalist at bedside

## 2020-10-09 NOTE — Progress Notes (Signed)
Pharmacy Antibiotic Note  [name] is a 87 y.o. male admitted on 10/09/2020 with pneumonia.  Pharmacy has been consulted for Vancomycin dosing.   Height: 5\' 11"  (180.3 cm) Weight: 58.7 kg (129 lb 6.6 oz) IBW/kg (Calculated) : 75.3  Temp (24hrs), Avg:98.2 F (36.8 C), Min:97.9 F (36.6 C), Max:98.6 F (37 C)  Recent Labs  Lab 10/09/20 2009  WBC 14.1*  CREATININE 2.30*    Estimated Creatinine Clearance: 18.8 mL/min (A) (by C-G formula based on SCr of 2.3 mg/dL (H)).    No Known Allergies  Antimicrobials this admission: 1/15 Cefepime >>  1/15 Vancomycin >>   Dose adjustments this admission: N/a  Microbiology results: Pending   Plan:  - Vancomycin 1250mg  IV x 1 dose  - Followed by Vancomycin 1000mg  IV q48h - Est Calc AUC 484 - Monitor patients renal function and urine output  - De-escalate ABX when appropriate   Thank you for allowing pharmacy to be a part of this patients care.  Duanne Limerick PharmD. BCPS 10/09/2020 9:17 PM

## 2020-10-09 NOTE — ED Notes (Signed)
Pt cleaned, complete linen change and pericare provided with [place].

## 2020-10-09 NOTE — ED Notes (Signed)
Able to speak with lab regarding lab add on for procalcitonin.

## 2020-10-09 NOTE — ED Provider Notes (Addendum)
I received this patient in signout from Dr. Karle Starch.  Briefly, he had presented with G-tube problem and had G-tube replacement in the ED.  He had been discharged and was awaiting transport back when he was noted to become hypoxic.  On exam, patient with rhonchi bilaterally and lower extremity edema which he states is chronic.  Labs show COVID-19 negative, creatinine 2.3 similar to baseline, BNP elevated at 671, sodium 133, WBC 14.1.  Chest x-ray with left lower lobe infiltrate.  Gave the patient broad-spectrum antibiotics to cover for HCAP given recent hospitalization. Discussed admission w/ internal med teaching service.   Little, Wenda Overland, MD 10/09/20 2206   12:00 AM  I was contacted by Internal Medicine team that g-tube was out again. I replaced at bedside and have ordered confirmatory KUB. The inpatient team will f/u on radiologic results prior to using G-tube.  Gastrostomy tube replacement  Date/Time: 10/10/2020 12:01 AM Performed by: Sharlett Iles, MD Authorized by: Sharlett Iles, MD  Consent: Verbal consent obtained. Consent given by: patient Patient identity confirmed: verbally with patient Preparation: Patient was prepped and draped in the usual sterile fashion. Local anesthesia used: no  Anesthesia: Local anesthesia used: no  Sedation: Patient sedated: no  Patient tolerance: patient tolerated the procedure well with no immediate complications      Little, Wenda Overland, MD 10/10/20 0002

## 2020-10-09 NOTE — ED Notes (Signed)
Patient transported to X-ray 

## 2020-10-10 ENCOUNTER — Observation Stay (HOSPITAL_COMMUNITY): Payer: Medicare (Managed Care)

## 2020-10-10 DIAGNOSIS — R1310 Dysphagia, unspecified: Secondary | ICD-10-CM | POA: Diagnosis not present

## 2020-10-10 DIAGNOSIS — Z20822 Contact with and (suspected) exposure to covid-19: Secondary | ICD-10-CM | POA: Diagnosis not present

## 2020-10-10 DIAGNOSIS — K9423 Gastrostomy malfunction: Secondary | ICD-10-CM | POA: Diagnosis not present

## 2020-10-10 DIAGNOSIS — I130 Hypertensive heart and chronic kidney disease with heart failure and stage 1 through stage 4 chronic kidney disease, or unspecified chronic kidney disease: Secondary | ICD-10-CM | POA: Diagnosis not present

## 2020-10-10 HISTORY — PX: IR REPLACE G-TUBE SIMPLE WO FLUORO: IMG2323

## 2020-10-10 LAB — GLUCOSE, CAPILLARY
Glucose-Capillary: 187 mg/dL — ABNORMAL HIGH (ref 70–99)
Glucose-Capillary: 127 mg/dL — ABNORMAL HIGH (ref 70–99)
Glucose-Capillary: 180 mg/dL — ABNORMAL HIGH (ref 70–99)
Glucose-Capillary: 167 mg/dL — ABNORMAL HIGH (ref 70–99)
Glucose-Capillary: 209 mg/dL — ABNORMAL HIGH (ref 70–99)
Glucose-Capillary: 232 mg/dL — ABNORMAL HIGH (ref 70–99)
Glucose-Capillary: 229 mg/dL — ABNORMAL HIGH (ref 70–99)

## 2020-10-10 LAB — COMPREHENSIVE METABOLIC PANEL
ALT: 31 U/L (ref 0–44)
AST: 28 U/L (ref 15–41)
Albumin: 3.1 g/dL — ABNORMAL LOW (ref 3.5–5.0)
Alkaline Phosphatase: 119 U/L (ref 38–126)
Anion gap: 11 (ref 5–15)
BUN: 148 mg/dL — ABNORMAL HIGH (ref 8–23)
CO2: 30 mmol/L (ref 22–32)
Calcium: 10.1 mg/dL (ref 8.9–10.3)
Chloride: 95 mmol/L — ABNORMAL LOW (ref 98–111)
Creatinine, Ser: 2.31 mg/dL — ABNORMAL HIGH (ref 0.61–1.24)
GFR, Estimated: 27 mL/min — ABNORMAL LOW (ref >60)
Glucose, Bld: 143 mg/dL — ABNORMAL HIGH (ref 70–99)
Potassium: 4.5 mmol/L (ref 3.5–5.1)
Sodium: 136 mmol/L (ref 135–145)
Total Bilirubin: 0.6 mg/dL (ref 0.3–1.2)
Total Protein: 6.9 g/dL (ref 6.5–8.1)

## 2020-10-10 LAB — URINALYSIS, ROUTINE W REFLEX MICROSCOPIC
Bilirubin Urine: NEGATIVE
Glucose, UA: NEGATIVE mg/dL
Hgb urine dipstick: NEGATIVE
Ketones, ur: NEGATIVE mg/dL
Leukocytes,Ua: NEGATIVE
Nitrite: NEGATIVE
Protein, ur: NEGATIVE mg/dL
Specific Gravity, Urine: 1.009 (ref 1.005–1.030)
pH: 8 (ref 5.0–8.0)

## 2020-10-10 LAB — CBC WITH DIFFERENTIAL/PLATELET
Abs Immature Granulocytes: 0.09 10*3/uL — ABNORMAL HIGH (ref 0.00–0.07)
Basophils Absolute: 0.1 10*3/uL (ref 0.0–0.1)
Basophils Relative: 0 %
Eosinophils Absolute: 0.2 10*3/uL (ref 0.0–0.5)
Eosinophils Relative: 1 %
HCT: 40.9 % (ref 39.0–52.0)
Hemoglobin: 13.1 g/dL (ref 13.0–17.0)
Immature Granulocytes: 1 %
Lymphocytes Relative: 9 %
Lymphs Abs: 1.4 10*3/uL (ref 0.7–4.0)
MCH: 30.6 pg (ref 26.0–34.0)
MCHC: 32 g/dL (ref 30.0–36.0)
MCV: 95.6 fL (ref 80.0–100.0)
Monocytes Absolute: 1.7 10*3/uL — ABNORMAL HIGH (ref 0.1–1.0)
Monocytes Relative: 11 %
Neutro Abs: 11.9 10*3/uL — ABNORMAL HIGH (ref 1.7–7.7)
Neutrophils Relative %: 78 %
Platelets: 192 10*3/uL (ref 150–400)
RBC: 4.28 MIL/uL (ref 4.22–5.81)
RDW: 14.4 % (ref 11.5–15.5)
WBC: 15.4 10*3/uL — ABNORMAL HIGH (ref 4.0–10.5)
nRBC: 0 % (ref 0.0–0.2)

## 2020-10-10 LAB — PROCALCITONIN: Procalcitonin: 0.17 ng/mL

## 2020-10-10 LAB — PHOSPHORUS: Phosphorus: 3.8 mg/dL (ref 2.5–4.6)

## 2020-10-10 LAB — MAGNESIUM: Magnesium: 2.7 mg/dL — ABNORMAL HIGH (ref 1.7–2.4)

## 2020-10-10 MED ORDER — PROSOURCE TF PO LIQD
45.0000 mL | Freq: Two times a day (BID) | ORAL | Status: DC
  Administered 2020-10-10 (×2): 45 mL
  Filled 2020-10-10 (×3): qty 45

## 2020-10-10 MED ORDER — IOHEXOL 300 MG/ML  SOLN: 50.0000 mL | Freq: Once | INTRAMUSCULAR | Status: DC | PRN

## 2020-10-10 MED ORDER — OSMOLITE 1.2 CAL PO LIQD
1000.0000 mL | ORAL | Status: DC
  Administered 2020-10-10: 1000 mL
  Filled 2020-10-10 (×2): qty 1000

## 2020-10-10 MED ORDER — FREE WATER
160.0000 mL | Freq: Four times a day (QID) | Status: DC
  Administered 2020-10-10 – 2020-10-11 (×3): 160 mL

## 2020-10-10 NOTE — Consult Note (Signed)
[place] Nurse Consult Note: Patient receiving care in [place] This patient was seen by [facility] last admission. Consult completed remotely after review of chart and photos Reason for Consult: Leg wounds Wound type: Partial thickness abrasions on bilateral LE which patient states are from a fall.   Pressure Injury POA: NA Wound bed: Red/Pink Drainage (amount, consistency, odor)  Periwound: Intact Dressing procedure/placement/frequency: Continue foam dressings on abrasions on LE. Peel down all sites with a foam dressing EACH shift.  Record your observations.  Change foam dressing every 3 days or PRN soiling.   Notify the physician team if the area worsens  Monitor the wound area(s) for worsening of condition such as: Signs/symptoms of infection,  Increase in size,  Development of or worsening of odor, Development of pain, or increased pain at the affected locations.   Notify the medical team if any of these develop.  Thank you for the consult.  Discussed plan of care with the patient.  [place] nurse will not follow at this time.   Please re-consult the [facility] team if needed.  Cathlean Marseilles Tamala Julian, MSN, RN, [place], Lysle Pearl, [facility] Wound Treatment Associate  Pager [phone]

## 2020-10-10 NOTE — Progress Notes (Signed)
Initial Nutrition Assessment  DOCUMENTATION CODES:   Not applicable  INTERVENTION:   -Increase Osmolite 1.2 to 65 ml/hr via g-tube  45 ml Prosource TF daily.    160 ml free water flush every 6 hours  Tube feeding regimen provides 1912 kcal (100% of needs), 98 grams of protein, and 1279 ml of H2O. Total free water: 1919 ml daily  NUTRITION DIAGNOSIS:   Inadequate oral intake related to inability to eat,dysphagia as evidenced by NPO status.  GOAL:   Patient will meet greater than or equal to 90% of their needs  MONITOR:   Labs,Weight trends,TF tolerance,Skin,I & O's  REASON FOR ASSESSMENT:   Consult Enteral/tube feeding initiation and management  ASSESSMENT:   [name] is an 87 y.o. 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  Pt admitted with acute hypoxic respiratory failure and CHF.   1/15- g-tube replaced and positioning verified by KUB  Reviewed I/O's: -944 ml x 24 hours  UOP: 1.1 L x 24 hours  Pt unavailable at time of attempted contact.   Pt familiar to this RD due to prior admissions. Per previous RD notes, home regimen is 4 cartons (237 ml) Nepro daily and 30 ml Prosource daily, which provides 1875 kcals, 90 grams protein, and 905 ml free water daily, meeting 100% of estimated kcal and protein needs. Pt with minimal PO intake and high aspiration risk.   Pt started on continuous tube feedings yesterday. Osmolite 1.2 infusing via g-tube at 50 ml/hr, which provides 1440 kcals, 67 grams protein, and 984 ml free water daily, meeting 76% of estimated kcal needs and 71% of estimated  protein needs.   Reviewed wt hx; wt has been stable over the past 6 months.   Pt with history of severe malnutrition, which RD suspects is ongoing, however, unable to identify at this time.   Labs reviewed: K and Phos WDL. Mg: 2.7. CBGS 127-180 (inpatient orders for glycemic control are 0-9 units insulin aspart every 4 hours and 4 units insulin glargine daily).   Diet Order:   Diet Order    None      EDUCATION NEEDS:   No education needs have been identified at this time  Skin:  Skin Assessment: Skin Integrity Issues: Skin Integrity Issues:: Other (Comment) Other: open wounds to rt and lt pretibial  Last BM:  10/09/20  Height:   Ht Readings from Last 1 Encounters:  10/10/20 5\' 11"  (1.803 m)    Weight:   Wt Readings from Last 1 Encounters:  10/10/20 64.6 kg    Ideal Body Weight:  78.2 kg  BMI:  Body mass index is 19.86 kg/m.  Estimated Nutritional Needs:   Kcal:  1900-2100  Protein:  95-110 grams  Fluid:  > 1.9 L    Loistine Chance, RD, LDN, [place] Registered Dietitian II Certified Diabetes Care and Education Specialist Please refer to [facility] for RD and/or RD on-call/weekend/after hours pager

## 2020-10-10 NOTE — Discharge Summary (Signed)
Name: [name] MRN: [id number] DOB: [birth year] 87 y.o. PCP: Axel Filler, MD  Date of Admission: 10/09/2020  9:42 AM Date of Discharge: 10/11/2020 Attending Physician: Dr. Angelia Mould  Discharge Diagnosis: Active Problems:   Acute exacerbation of CHF (congestive heart failure) [facility])    Discharge Medications: Allergies as of 10/11/2020   No Known Allergies     Medication List    TAKE these medications   Accu-Chek Aviva Plus test strip Generic drug: glucose blood Check blood sugar up to 3 times a day   Accu-Chek Aviva Plus w/Device Kit Check finger stick glucose once daily   aspirin EC 81 MG tablet 81 mg daily. Per tube   atorvastatin 40 MG tablet Commonly known as: LIPITOR TAKE 1 TABLET(40 MG) BY TUBE DAILY AT 6 PM What changed: additional instructions   carvedilol 6.25 MG tablet Commonly known as: COREG PLACE 1 TABLET INTO FEEDING TUBE TWICE DAILY WITH A MEAL What changed:   how much to take  how to take this  when to take this  additional instructions   feeding supplement (NEPRO CARB STEADY) Liqd Place 237 mLs into feeding tube 4 (four) times daily. What changed: how much to take   feeding supplement (PRO-STAT SUGAR FREE 64) Liqd Place 30 mLs into feeding tube daily.   insulin glargine 100 UNIT/ML injection Commonly known as: LANTUS Inject 0.08 mLs (8 Units total) into the skin daily.   Insulin Pen Needle 31G X 8 MM Misc Commonly known as: B-D ULTRAFINE III SHORT PEN USE ONCE DAILY WITH LANTUS PEN   lansoprazole 30 MG disintegrating tablet Commonly known as: PREVACID SOLUTAB Place 30 mg into feeding tube daily.   liver oil-zinc oxide 40 % ointment Commonly known as: DESITIN Apply 1 application topically See admin instructions. Apply twice daily and as needed for irritation   polyethylene glycol 17 g packet Commonly known as: MIRALAX / GLYCOLAX Place 17 g into feeding tube daily.   polyvinyl alcohol 1.4 % ophthalmic  solution Commonly known as: LIQUIFILM TEARS Place 1 drop into both eyes 4 (four) times daily as needed for dry eyes.   prochlorperazine 5 MG tablet Commonly known as: COMPAZINE Place 5 mg into feeding tube 3 (three) times daily as needed for nausea.   tamsulosin 0.4 MG Caps capsule Commonly known as: FLOMAX Take 2 capsules (0.8 mg total) by mouth at bedtime. Per tube What changed: how to take this   triamcinolone 0.025 % cream Commonly known as: KENALOG Apply 1 application topically 2 (two) times daily as needed (venous stasis dermatitis).   trimethoprim-polymyxin b ophthalmic solution Commonly known as: POLYTRIM Place 1 drop into both eyes 4 (four) times daily.   Ventolin HFA 108 (90 Base) MCG/ACT inhaler Generic drug: albuterol INHALE 2 PUFFS INTO THE LUNGS EVERY 6 HOURS AS NEEDED FOR WHEEZING OR SHORTNESS OF BREATH What changed: See the new instructions.            Discharge Care Instructions  (From admission, onward)         Start     Ordered   10/11/20 0000  Leave dressing on - Keep it clean, dry, and intact until clinic visit        10/11/20 0802   10/10/20 0000  Leave dressing on - Keep it clean, dry, and intact until clinic visit        10/10/20 1347          Disposition and follow-up:   [name] was  discharged from [facility] in [place] condition.  At the hospital follow up visit please address:  1.  Follow-up:  a. Acute Hypoxic Respiratory Failure 2/2 to Mild Acute on Chronic Heart Failure    B. G-Tube Dysfunction  2.  Labs / imaging needed at time of follow-up: BMP  3.  Pending labs/ test needing follow-up: None  4.  Medication Changes  Started: None  Stopped: None  Changed: Tamulosin changed from per tube to PO. Med that isn't able to             be given by tube per pharmacy.   Follow-up Appointments:  Follow-up Information    Schedule an appointment as soon as possible for a visit  with Axel Filler,  MD.   Specialty: Internal Medicine Contact information: [address] [place] [address] [place] [address] [phone]        [place].   Specialty: Emergency Medicine Why: As needed Contact information: [address] [id number] [place] Hospital Course by problem list:  Acute Hypoxic Respiratory Failure 2/2 to Mild Acute on Chronic Heart Failure (HFrEF) Patient was being seen in ED for g tube dislodgment when he was found to be hypoxic. He was worked up for possible pneumonia. He received dose of vancomycin and cefepime in the ED for possible pneumonia in setting of recent hospital admission. Procalcitonin was reassuringly low. He remained without hypoxia, thought to be secondary to positional vs mild acute on chronic CHF exacerbation.He was given 1 dose of IV lasix and discharged the following day. He was no longer in respiratory distress and discharged home.    G-Tube Dysfunction Initially presented to the ED with dysfunctional G tube. G tube was replaced by IR, however, 2nd placed tube became dislodged. IR re-consulted at this time and placed new G tube. X-ray confirmed position. No other complications occurred.   Discharge Vitals:   BP 126/67 (BP Location: Right Arm)   Pulse 90   Temp 98.4 F (36.9 C) (Oral)   Resp 20   Ht $R'5\' 11"'ny$  (1.803 m)   Wt 64.6 kg   SpO2 94%   BMI 19.86 kg/m   Pertinent Labs, Studies, and Procedures:  CBC Latest Ref Rng & Units 10/10/2020 10/09/2020 09/28/2020  WBC 4.0 - 10.5 K/uL 15.4(H) 14.1(H) 7.6  Hemoglobin 13.0 - 17.0 g/dL 13.1 13.6 11.7(L)  Hematocrit 39.0 - 52.0 % 40.9 42.7 34.4(L)  Platelets 150 - 400 K/uL 192 205 178    CMP Latest Ref Rng & Units 10/11/2020 10/10/2020 10/09/2020  Glucose 70 - 99 mg/dL 265(H) 143(H) 250(H)  BUN 8 - 23 mg/dL 133(H) 148(H) 152(H)  Creatinine 0.61 - 1.24 mg/dL 2.24(H) 2.31(H) 2.30(H)  Sodium 135 - 145  mmol/L 137 136 133(L)  Potassium 3.5 - 5.1 mmol/L 4.5 4.5 4.9  Chloride 98 - 111 mmol/L 97(L) 95(L) 92(L)  CO2 22 - 32 mmol/L 28 30 32  Calcium 8.9 - 10.3 mg/dL 9.6 10.1 10.4(H)  Total Protein 6.5 - 8.1 g/dL - 6.9 8.0  Total Bilirubin 0.3 - 1.2 mg/dL - 0.6 0.4  Alkaline Phos 38 - 126 U/L - 119 208(H)  AST 15 - 41 U/L - 28 40  ALT 0 - 44 U/L - 31 37    DG Hip Unilat With Pelvis 2-3 Views Left  Result Date: 10/14/2020 CLINICAL DATA:  Left hip pain  EXAM: DG HIP (WITH OR WITHOUT PELVIS) 2-3V LEFT COMPARISON:  X-ray 09/22/2020 FINDINGS: No evidence of acute fracture. No dislocation. Moderate degenerative changes of the bilateral hips. Mild osseous demineralization. Prominent vascular calcifications. There is oral contrast within the visualized bowel and extending into numerous sigmoid diverticula. IMPRESSION: 1. No acute osseous abnormality. 2. Moderate degenerative changes of the bilateral hips. Electronically Signed   By: Davina Poke D.O.   On: 10/14/2020 12:18     Discharge Instructions: Discharge Instructions    (HEART FAILURE PATIENTS) Call MD:  Anytime you have any of the following symptoms: 1) 3 pound weight gain in 24 hours or 5 pounds in 1 week 2) shortness of breath, with or without a dry hacking cough 3) swelling in the hands, feet or stomach 4) if you have to sleep on extra pillows at night in order to breathe.   Complete by: As directed    Call MD for:  difficulty breathing, headache or visual disturbances   Complete by: As directed    Call MD for:  extreme fatigue   Complete by: As directed    Call MD for:  persistant dizziness or light-headedness   Complete by: As directed    Call MD for:  persistant nausea and vomiting   Complete by: As directed    Call MD for:  redness, tenderness, or signs of infection (pain, swelling, redness, odor or green/yellow discharge around incision site)   Complete by: As directed    Call MD for:  temperature >100.4   Complete by: As  directed    Diet - low sodium heart healthy   Complete by: As directed    Increase activity slowly   Complete by: As directed    Increase activity slowly   Complete by: As directed    Leave dressing on - Keep it clean, dry, and intact until clinic visit   Complete by: As directed    Leave dressing on - Keep it clean, dry, and intact until clinic visit   Complete by: As directed       Signed: Riesa Pope, MD 10/14/2020, 3:31 PM   Pager: [phone]

## 2020-10-10 NOTE — Procedures (Signed)
LUQ gastrostomy site identified. New 18 Fr balloon retention replacement gastrostomy tube placed without difficulty. Balloon inflated to 53mL. Bumper cinched snug Flushes easily. Will get contrast injection xray to confirm position. May resume use after.  Ascencion Dike PA-C Interventional Radiology 10/10/2020 11:27 AM

## 2020-10-10 NOTE — Progress Notes (Addendum)
Attempted to call sister in regards to transportation home and was unsuccessful. Patient relayed sister can not pick up patient. Made RN-CM and LCSW aware to set up [facility] for transportation home.

## 2020-10-10 NOTE — Consult Note (Signed)
[place] Nurse Consult Note: Reason for Consult:Follow up on bilateral pretibial wounds. Photos have been entered into EMR by Dr. Gilford Rile and are appreciated. Wound type: Likely mixed etiology, venous/arterial. Patient states wounds are from a fall (trauma), but vascular insufficiency is evident with hemosiderin staining present. Pressure Injury POA: N/A Measurement: Right LE affected area measures 8cm x 8cm x 0.1cm red, moist, scant exudate Left LE affected area measures 12cm x 10cm x 0.1cm with red wound bed, scant exudate Wound KCL:EXNTZGY above Drainage (amount, consistency, odor) as noted above Periwound:dry, cracking Dressing procedure/placement/frequency: I have discussed the POC with VTamala Julian and will add a wound contact layer between the wound and topper dressing of antimicrobial nonadherent, xeroform gauze Kellie Simmering # [phone]) to provide atraumatic dressing changes and donate an antimicrobial and moist topper.Patient declines Prevalon boots.  I have additionally provided guidance for Nursing to float heels and to place a silicone foam dressing to the sacrum for pressure injury prevention.  [place] nursing team will not follow, but will remain available to this patient, the nursing and medical teams.  Please re-consult if needed. Thanks, Maudie Flakes, MSN, RN, [place], Arther Abbott  Pager# [phone]

## 2020-10-10 NOTE — Discharge Instructions (Signed)
To [name],   It was a pleasure taking care of you during your hospital stay. You were originally admitted for concern of pneumonia and started on antibiotics. Your overnight test results show that there is no bacterial pneumonia and your COVID 19 test is negative. Your g tube was replaced by radiology as it was dislodged overnight.

## 2020-10-10 NOTE — Progress Notes (Signed)
Subjective:   Patient seen at bedside this AM. He states that he is doing well, and no longer wants his [place]. O2 turned off during the interview and patient had no complaints of SHOB. No complaints this AM.   ADDENDUM:  Paged by bedside nurse that his 2nd g-tube had become dislodged. Will place consult to IR for g-tube replacement.   Objective:  Vital signs in last 24 hours: Vitals:   10/09/20 2236 10/10/20 0105 10/10/20 0415 10/10/20 0738  BP: 105/60 125/71 (!) 115/58 107/88  Pulse: 86 82 78 89  Resp: 18 17 20 17   Temp: 98.2 F (36.8 C) 98.1 F (36.7 C) (!) 97.5 F (36.4 C) 98 F (36.7 C)  TempSrc: Oral Oral Oral   SpO2: 99% 98% 100% 94%  Weight:  64.6 kg    Height:  5\' 11"  (1.803 m)     Physical Exam Constitutional:      Appearance: Normal appearance.  Cardiovascular:     Rate and Rhythm: Normal rate and regular rhythm.     Pulses: Normal pulses.     Heart sounds: Normal heart sounds. No murmur heard. No friction rub. No gallop.   Pulmonary:     Effort: Pulmonary effort is normal.     Breath sounds: Normal breath sounds. No wheezing, rhonchi or rales.     Comments: Faint rales appreciated bilaterally.  Abdominal:     General: Bowel sounds are normal.     Tenderness: There is no abdominal tenderness. There is no guarding.     Comments: g-tube intact with no erythema, purulence, or drainage.   Musculoskeletal:     Comments: Chronic well healing bilateral tibial lesions appreciated bilaterally with no erythema, purulence, or drainage.   Neurological:     Mental Status: He is alert and oriented to person, place, and time.  Psychiatric:        Behavior: Behavior normal.            CBC Latest Ref Rng & Units 10/10/2020 10/09/2020 09/28/2020  WBC 4.0 - 10.5 K/uL 15.4(H) 14.1(H) 7.6  Hemoglobin 13.0 - 17.0 g/dL 13.1 13.6 11.7(L)  Hematocrit 39.0 - 52.0 % 40.9 42.7 34.4(L)  Platelets 150 - 400 K/uL 192 205 178    BMP Latest Ref Rng & Units 10/10/2020 10/09/2020  09/28/2020  Glucose 70 - 99 mg/dL 143(H) 250(H) 100(H)  BUN 8 - 23 mg/dL 148(H) 152(H) 93(H)  Creatinine 0.61 - 1.24 mg/dL 2.31(H) 2.30(H) 2.01(H)  BUN/Creat Ratio 10 - 24 - - -  Sodium 135 - 145 mmol/L 136 133(L) 134(L)  Potassium 3.5 - 5.1 mmol/L 4.5 4.9 3.6  Chloride 98 - 111 mmol/L 95(L) 92(L) 95(L)  CO2 22 - 32 mmol/L 30 32 28  Calcium 8.9 - 10.3 mg/dL 10.1 10.4(H) 9.7     Assessment/Plan:  Active Problems:   Acute exacerbation of CHF (congestive heart failure) (HCC)  # Acute Hypoxic Respiratory Failure 2/2 to Mild Acute on Chronic Heart Failure:  Patient appears euvolemic on physical examination. Diuresed -955 mL total I/O. He is off supplemental oxygen. Mg is 2.7 and K is 4.5. - Continuous pulse oximetry - Strict I&O - Daily weights   - Continuous telemetry   # Leukocytosis  # Possible Pneumonia    procalcitonin 0.13. COVID-19 negative. CXR showed possible retrocardiac infiltrate, although no obvious lobar consolidations. Started on broad spectrum ABX in ED. - Discontinue vancomycin and cefepime - Will check urinalysis and culture to r/o UTI as cause for leukocytosis   #  Malignant Glottis Neoplasm, In Remission # G Tube Dysfunction, Currently Dislodged  G-Tube dislodged again post rounding, will consult IR for g tube placement with DC after successful placement.  - Consult IR for G-tube placement - FU Imaging.   # IDDM Type II  - Lantus 4 units  - Sensitive SSI  - CBG monitoring   # HTN Blood pressures overall stable on home medications.  - Continue home coreg 6.25mg  twice daily  - Continue to monitor   # AKI on [name] Stage IIIb   # Uremia Creatinine 2.30, BUN 152, GFR 27. K+ 4.9. Baseline Creatinine ~2. Slight bump in creatinine likely prerenal due to decreased intake in the setting of G tube dysfunction. Patient denies any symptoms of uremia. - Check morning CMP / electrolytes - Strict I&O   Dispo: Anticipated discharge in approximately  today.  Maudie Mercury, MD 10/10/2020, 11:38 AM Pager: [phone] After 5pm on weekdays and 1pm on weekends: On Call pager [phone]

## 2020-10-10 NOTE — Progress Notes (Signed)
Primary nurse reassessed patient during medication pass to find the g-tube dislodged. Internal medicine paged and told of the g-tube being out of place. Orders received to place damp gauze over the site. MD will place orders for IR replacement of gtube. Will continue to monitor site

## 2020-10-10 NOTE — Progress Notes (Signed)
Patient upset that he has been lying in bed wet at shift change. NT and RN went to change bedding and patient is not wet, condom catheter is in tact, reassured the patient that no piece of bed linen is wet but did change the condom and showed patient that is in tact and working. Patient keeps on reiterating that patient could not reach the call bell and telephone and would like it placed on the bedside table and not beside him. RN enters to perform shift assessment, patient tells RN that he is cold and does not want to turn to assess the back and does not want to take socks off to assess feet. Patient also relayed that the nursing staff is not doing their job, that he will call the big boss in here and RN will lose her job because the call bell is not within reach. RN relayed to the patient that the RN will put the call bell and phone back on the bedside table when assessment and meds are done. Agricultural consultant notified.

## 2020-10-10 NOTE — H&P (Addendum)
Date: 10/09/2020               Patient Name:  [name] MRN: [id number]  DOB: [birth year] Age / Sex: 87 y.o., male   PCP: Axel Filler, MD         Medical Service: Internal Medicine Teaching Service         Attending Physician: Dr. Rex Kras, Wenda Overland, MD    First Contact: Dr. Agustin Cree Pager: [phone]  Second Contact: Dr. Gilford Rile Pager: [phone]       After Hours (After 5p/  First Contact Pager: [phone]  weekends / holidays): Second Contact Pager: [phone]   Chief Complaint: Cough  History of Present Illness:   [name] is an 87 y.o. 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 The patient states he only came to the hospital because he was having troubles with his G-tube and wanted it replaced. He denies any nausea, vomiting, pain at his G tube site, or changes in bowel movements. He does note a cough that he states started in the emergency department. He has been coughing up yellow/green sputum. He notes he only had 1 COVID vaccination so far. Denies any fevers, chills, light-headedness, CP, palpitations, increase in swelling, sick contacts, or any other symptoms.   Medications Prior to Admission: Feeding supplement, Pro-stat sugar free - 103mL daily Feeding supplement, Nepro Carb Steady - 234mL four times daily Lansoprazole 30mg  daily Miralax 17g daily Compazine 5mg  three times daily PRN  ASA 81mg  daily  Atorvastatin 40mg  daily Coreg 6.25mg  twice daily  Lantus 8 units daily Desitin 40% ointment twice daily Kenalog 0.025% twice daily PRN Liquifilm tears 1 drop bilaterally 4  times daily Polytrim 1 drop bilaterally 4 times daily  Tamsulosin 0.8mg  nightly  Ventolin HFA 108 2 puffs q6 hours   Allergies: Allergies as of 10/09/2020  . (No Known Allergies)   Past Medical History:  Diagnosis Date  . Adenomatous colon polyp 02/14/2012  . AICD (automatic cardioverter/defibrillator) present    Medtronic   . BBB (bundle branch block)    right  . Carotid stenosis    a. s/p L carotid stent 2004;  b. Carotid US (09/2014): Bilateral ICA 1-39%, left ECA >59%, normal subclavian bilaterally, occluded left vertebral >> FU 2 years  . Chronic kidney disease   . Chronic systolic CHF (congestive heart failure) (HCC)    a. ischemic CM EF 15-20%;  b. s/p AICD 05/24/04;  c. Echo 7/06: EF 30-40%, mild reduced RVSF, d. Echo 12/2015 EF 35-40%  . Elevated PSA   . History of kidney stones    x 1  . HTN (hypertension)   . Hyperlipidemia   . Hyponatremia 09/22/2020  . ICD (implantable cardiac defibrillator) in place 12-25-2012   MDT CRTD upgrade by Dr Lovena Le  . Myocardial infarction ([place]) 1990  . Pericardial effusion    Echocardiogram (09/2014): EF 25% with distal anterior, distal inferior, distal lateral and apical akinesis, grade 1 diastolic dysfunction, very mild aortic stenosis (mean 7 mmHg) - this may be depressed due to low EF (2-D images suggest mild to moderate aortic stenosis), large pericardial  effusion, no RA collapse  . Pneumonia   . Presence of permanent cardiac pacemaker    Medtronic  . PVD (peripheral vascular disease) ([place])    s/p L carotid PTCA/stent 2004  . Transient ischemic attack   . Type II diabetes mellitus ([place])    type II    Family History:  Family History  Problem Relation Age of Onset  . Diabetes Mother   . Healthy Father   . Diabetes Brother   . Heart attack Neg Hx   . Stroke Neg Hx    Social History:  Social History   Socioeconomic History  . Marital status: Widowed    Spouse name: Not on file  . Number of children: Not on file  . Years of  education: 12  . Highest education level: Not on file  Occupational History  . Occupation: retired  Tobacco Use  . Smoking status: Former Smoker    Types: Cigarettes    Quit date: 12/27/1967    Years since quitting: 52.8  . Smokeless tobacco: Never Used  Vaping Use  . Vaping Use: Never used  Substance and Sexual Activity  . Alcohol use: No    Alcohol/week: 0.0 standard drinks    Comment: 12/25/2012 "quit all alcohol 60 yr ago"  . Drug use: No  . Sexual activity: Not on file  Other Topics Concern  . Not on file  Social History Narrative   ** Merged History Encounter **       Single, 2 adult children, daughter in [place], son in [place] Strain: Not on [employer] Insecurity: Not on file  Transportation Needs: Not on file  Physical Activity: Not on file  Stress: Not on file  Social Connections: Not on file   Review of Systems: A complete ROS was negative except as per HPI.   Physical Exam: Blood pressure 105/60, pulse 86, temperature 98.2 F (36.8 C), temperature source Oral, resp. rate 18, height 5\' 11"  (1.803 m), weight 58.7 kg, SpO2 99 %.   General: Patient appears thin and chronically ill, although in no acute distress. Eyes: Sclera non-icteric. No conjunctival injection.  HENT: MMM. No nasal discharge. Respiratory: There are diffuse rales and rhonchi with no wheezing. Patient is saturating at 100% on 1L [employer]. No tachypnea or increased work of breathing, although he does have noisy breathing with cough productive of yellow/green sputum. Cardiovascular: Rate is normal. Rhythm is regular. 2/6 early systolic murmur heard in the aortic and pulmonic regions, with 3/6 blowing systolic murmur heard along the lower left sternal border. 1+ bilateral lower extremity pitting edema to just under the ankles. Distal pulses intact in all four extremities.  Neurological: Patient is hard of hearing. He is alert and able to follow simple  commands. Has resting tremor in bilateral upper extremities.  Abdominal: Distended but soft and non-tender to palpation. Bowel sounds intact. G tube is dislodged with scant sanguinous discharge from the insertion site but no purulent drainage, surrounding erythema, or tenderness.  Musculoskeletal: Temporal wasting and cachexia of the extremities present. Mild lower extremity tenderness bilaterally.  Skin: There is erythema and hyperpigmentation overlying recently healed wounds on bilateral shins, consistent with chronic venous stasis changes.  Psych: Normal affect. Normal tone of voice.   EKG: personally reviewed my interpretation is NSR at 85bpm. Atrial-sensed, Ventricularly-paced rhythm, consistent with bi-ventricular pacemaker.  CXR: personally reviewed my interpretation is mild interstitial edema with retrocardiac airspace opacities consistent with infiltrate  vs. Atelectasis. Cardiomegaly. No obvious focal consolidations or pleural effusions. AICD in place.   DG Abdomen Peg Tube Location: Contrast visualized in the stomach after injection through PEG tube. Confirmed G tube placement.   Assessment & Plan by Problem: Active Problems:   * No active hospital problems. *  # Acute Hypoxic Respiratory Failure  Patient was in the process of being transported for discharge when he was noted to be hypoxic, requiring oxygen. Most likely in the setting of acute CHF exacerbation, although also consider possibility of healthcare acquired PNA given recent admission.   - Continuous pulse oximetry - See below   # Acute on Chronic Combined Heart Failure  Patient endorses new cough x 1 day although denies any other symptoms. He was placed on oxygen due to desaturations in the ED although is currently saturating 100% on 1L [place]. Also with 1+ bilateral lower extremity edema, diffuse systolic heart murmur, and crackles, concerning for CHF exacerbation. BNP 671. Last ECHO 03/03/20 showed EF 25-30% with LV RWMA's,  grade I diastolic dysfunction, large circumferential pericardial effusion, mild AS, although normal RV size, function, and IVC variability. Patient's Lasix 20mg  twice daily had been discontinued following recent discharge from the hospital 09/28/20.   - Give Lasix 40mg  IV once - Consider additional dose if minimal response to above - Strict I&O - Daily weights  - Check CMP, magnesium, phosphorus  - Continuous telemetry   # Leukocytosis  # Possible Pneumonia   Patient's cough today has been productive of yellow/green sputum. Denies fevers, chills, sick contacts, although had recently been admitted to the hospital. WBC 14.1 with neutrophilia, procalcitonin 0.13. COVID-19 negative. CXR showed possible retrocardiac infiltrate, although no obvious lobar consolidations. Started on broad spectrum ABX in ED.  - Continue vancomycin (per pharmacy) and cefepime for now - Continue supplemental O2 as needed for SpO2 >92% - Check morning CBC w/ diff  - Will check urinalysis and culture to r/o UTI as cause for leukocytosis   # Malignant Glottis Neoplasm, In Remission # G Tube Dysfunction, Currently Dislodged  Mr. Vipond had malignant neoplasm of the glottis treated with radiation 1/27-2/24/21, with last transnasal fiberoptic laryngoscopy performed 05/19/20 by Dr. Redmond Baseman showing salivary pooling although no recurrence of disease. Patient had PEG tube placed March 2021 due to dysphagia and prefers not to have this removed. Has had issues with "the cap breaking off and the clamp no longer closing". G tube was replaced by ED physician with placement confirmed on imaging; however, PEG tube was noted to be dislodged on my examination.   - Informed ED physician of dislodgement; MD to replace G tube  - Will check confirmatory abdominal imaging  - Ordered continuous tube feeding  - Give medications through G tube once placed  # IDDM Type II  Glucose elevated to 250. Patient states he had not received his evening  dose of Lantus 8 units. Does not take any other glucose-lowering agents at home. Last Hb A1c 09/23/20 was 7.0.   - Will give Lantus 4 units tonight given G tube dislodged  - Sensitive SSI  - CBG monitoring   # HTN Blood pressures overall stable on home medications.   - Continue home coreg 6.25mg  twice daily  - Continue to monitor   # AKI on [name] Stage IIIb   # Uremia Creatinine 2.30, BUN 152, GFR 27. K+ 4.9. Baseline Creatinine ~2. Slight bump in creatinine likely prerenal due to decreased intake in the setting of G tube dysfunction. Patient denies any  symptoms of uremia.  - Check morning CMP / electrolytes - Strict I&O   Code Status: Full Code DVT PPx: Lovenox  Diet: Continuous tube feeding IVF: None  Dispo: Admit patient to Observation with expected length of stay less than 2 midnights.  Signed: Jeralyn Bennett, MD 10/09/2020, 11:34 PM  Pager: [phone] After 5pm on weekdays and 1pm on weekends: On Call pager: [phone]

## 2020-10-11 ENCOUNTER — Telehealth: Payer: Self-pay | Admitting: Emergency Medicine

## 2020-10-11 LAB — BASIC METABOLIC PANEL
Anion gap: 12 (ref 5–15)
BUN: 133 mg/dL — ABNORMAL HIGH (ref 8–23)
CO2: 28 mmol/L (ref 22–32)
Calcium: 9.6 mg/dL (ref 8.9–10.3)
Chloride: 97 mmol/L — ABNORMAL LOW (ref 98–111)
Creatinine, Ser: 2.24 mg/dL — ABNORMAL HIGH (ref 0.61–1.24)
GFR, Estimated: 28 mL/min — ABNORMAL LOW (ref >60)
Glucose, Bld: 265 mg/dL — ABNORMAL HIGH (ref 70–99)
Potassium: 4.5 mmol/L (ref 3.5–5.1)
Sodium: 137 mmol/L (ref 135–145)

## 2020-10-11 LAB — GLUCOSE, CAPILLARY
Glucose-Capillary: 249 mg/dL — ABNORMAL HIGH (ref 70–99)
Glucose-Capillary: 290 mg/dL — ABNORMAL HIGH (ref 70–99)

## 2020-10-11 LAB — PROCALCITONIN: Procalcitonin: 0.25 ng/mL

## 2020-10-11 LAB — URINE CULTURE: Culture: NO GROWTH

## 2020-10-11 MED ORDER — ACETAMINOPHEN 325 MG PO TABS
650.0000 mg | ORAL_TABLET | Freq: Four times a day (QID) | ORAL | Status: DC | PRN
  Administered 2020-10-11: 650 mg via ORAL
  Filled 2020-10-11: qty 2

## 2020-10-11 MED ORDER — LIDOCAINE 5 % EX PTCH
1.0000 | MEDICATED_PATCH | CUTANEOUS | Status: DC
  Administered 2020-10-11: 1 via TRANSDERMAL
  Filled 2020-10-11: qty 1

## 2020-10-11 MED ORDER — POLYETHYLENE GLYCOL 3350 17 G PO PACK: 17.0000 g | PACK | Freq: Every day | ORAL | 0 refills | Status: DC

## 2020-10-11 MED ORDER — ATORVASTATIN CALCIUM 40 MG PO TABS: ORAL_TABLET | ORAL | 3 refills | Status: AC

## 2020-10-11 MED ORDER — TAMSULOSIN HCL 0.4 MG PO CAPS: 0.8000 mg | ORAL_CAPSULE | Freq: Every day | ORAL | 0 refills | Status: DC

## 2020-10-11 NOTE — Telephone Encounter (Signed)
LMOM to call DC. Clinic #and hours provided.  Has not returned call of Ursey PA on 10/05/20 that was to notify that reached RRT 10/05/20. He has a f/u appointment scheduled 11/01/20 with Dr Lovena Le.

## 2020-10-11 NOTE — Progress Notes (Signed)
Pt request pain meds for back, paged MD as pt has none listed on his MAR. Waiting for response

## 2020-10-11 NOTE — Progress Notes (Incomplete)
HD#0 Subjective:  Overnight Events: ***   ***  Objective:  Vital signs in last 24 hours: Vitals:   10/10/20 0738 10/10/20 1627 10/10/20 2011 10/11/20 0616  BP: 107/88 113/70 (!) 132/56 126/67  Pulse: 89 87 90   Resp: 17 16 (!) 21 20  Temp: 98 F (36.7 C) 98.2 F (36.8 C) 98 F (36.7 C) 98.4 F (36.9 C)  TempSrc:  Oral Oral Oral  SpO2: 94% 100% 100% 94%  Weight:      Height:       Supplemental O2: {NAMES:3044014::"Room Air","Nasal Cannula","Simple Face Mask","Partial Rebreather","HFNC","Non Rebreather","Venturi Mask","Bag Valve Mask"} SpO2: 94 % O2 Flow Rate (L/min): 0 L/min   Physical Exam:  Physical Exam  Filed Weights   10/09/20 2104 10/10/20 0105  Weight: 58.7 kg 64.6 kg     Intake/Output Summary (Last 24 hours) at 10/11/2020 0702 Last data filed at 10/11/2020 0600 Gross per 24 hour  Intake 2012.59 ml  Output 1350 ml  Net 662.59 ml   Net IO Since Admission: -281.58 mL [10/11/20 0702]  Pertinent Labs: CBC Latest Ref Rng & Units 10/10/2020 10/09/2020 09/28/2020  WBC 4.0 - 10.5 K/uL 15.4(H) 14.1(H) 7.6  Hemoglobin 13.0 - 17.0 g/dL 13.1 13.6 11.7(L)  Hematocrit 39.0 - 52.0 % 40.9 42.7 34.4(L)  Platelets 150 - 400 K/uL 192 205 178    CMP Latest Ref Rng & Units 10/11/2020 10/10/2020 10/09/2020  Glucose 70 - 99 mg/dL 265(H) 143(H) 250(H)  BUN 8 - 23 mg/dL 133(H) 148(H) 152(H)  Creatinine 0.61 - 1.24 mg/dL 2.24(H) 2.31(H) 2.30(H)  Sodium 135 - 145 mmol/L 137 136 133(L)  Potassium 3.5 - 5.1 mmol/L 4.5 4.5 4.9  Chloride 98 - 111 mmol/L 97(L) 95(L) 92(L)  CO2 22 - 32 mmol/L 28 30 32  Calcium 8.9 - 10.3 mg/dL 9.6 10.1 10.4(H)  Total Protein 6.5 - 8.1 g/dL - 6.9 8.0  Total Bilirubin 0.3 - 1.2 mg/dL - 0.6 0.4  Alkaline Phos 38 - 126 U/L - 119 208(H)  AST 15 - 41 U/L - 28 40  ALT 0 - 44 U/L - 31 37    Imaging: IR REPLACE G-TUBE SIMPLE WO FLUORO  Result Date: 10/10/2020 INDICATION: Dislodged gastrostomy tube.  Request replacement. EXAM: REPLACEMENT OF  GASTROSTOMY TUBE COMPARISON:  None. MEDICATIONS: None. CONTRAST:  None FLUOROSCOPY TIME:  None COMPLICATIONS: None immediate. PROCEDURE: Informed written consent was obtained from the patient after a discussion of the risks, benefits and alternatives to treatment. Questions regarding the procedure were encouraged and answered. A timeout was performed prior to the initiation of the procedure. The existing gastrostomy tube site was identified in the left upper quadrant. No evidence of tract closure. A new 44 French balloon retention gastrostomy replacement tube was placed without difficulty. The balloon was inflated with 8 mL normal saline and disc was cinched. A dressing was placed. The patient tolerated the procedure well without immediate postprocedural complication. Separate contrast injection and imaging will be performed to verify tube location. IMPRESSION: Successful replacement of a new 18-French gastrostomy tube. The new gastrostomy tube is ready for immediate use. Separate contrast injection and imaging will be performed to verify tube location. Read by: Ascencion Dike PA-C Electronically Signed   By: Markus Daft M.D.   On: 10/10/2020 13:10   DG ABDOMEN PEG TUBE LOCATION  Result Date: 10/10/2020 CLINICAL DATA:  Dislodged gastrostomy tube. New 20fr balloon retention placed, confirm intragastric location per order. EXAM: ABDOMEN - 1 VIEW COMPARISON:  None. FINDINGS: Replaced gastrostomy tube  projects along the greater curvature of the gastric body. Contrast is seen within the stomach, small bowel, colon and rectum. There is no contrast extravasation. No bowel dilation. IMPRESSION: 1. Well-positioned gastrostomy tube. Electronically Signed   By: Lajean Manes M.D.   On: 10/10/2020 12:07    Assessment/Plan:   Active Problems:   Acute exacerbation of CHF (congestive heart failure) ([place])   Patient Summary: [name] is a 87 y.o. with a pertinent PMH of ***, who presented with *** and admitted  for ***.    *** ***  *** ***  *** ***  *** ***  Diet: Heart Healthy IVF: None,None VTE: Enoxaparin Code: Full  Dispo: Anticipated discharge to Home today   [place] Internal Medicine Resident PGY-1 Pager [phone] Please contact the on call pager after 5 pm and on weekends at [phone].

## 2020-10-11 NOTE — TOC Transition Note (Signed)
Transition of Care [facility]) - CM/SW Discharge Note   Patient Details  Name: [name] MRN: [id number] Date of Birth: 1930-12-05  Transition of Care [facility]) CM/SW Contact:  [name], RN Phone Number: 10/11/2020, 8:16 AM   Clinical Narrative:    NCM was told patient is ready for dc today, NCM informed [name] patient sister that he is being transported home this am by [facility] ,she says that is great, she will let his son Dyquan know also.  NCM left [place] a vm that patient is for dc this am.  He is a patient of [facility] also, [facility] is closed today.  He lives in [place] living per [facility] and he is ok to go home.   Final next level of care: Home/Self Care Barriers to Discharge: No Barriers Identified   Patient Goals and CMS Choice        Discharge Placement                       Discharge Plan and Services                                     Social Determinants of Health (SDOH) Interventions     Readmission Risk Interventions No flowsheet data found.

## 2020-10-11 NOTE — TOC Progression Note (Signed)
Transition of Care [facility]) - Progression Note    Patient Details  Name: [name] MRN: [id number] Date of Birth: 10-26-1930  Transition of Care [facility]) CM/SW Contact  [name], RN Phone Number: 10/11/2020, 8:10 AM  Clinical Narrative:    NCM was told patient is ready for dc today, NCM informed [name] patient sister that he is being transported home this am by [facility] ,she says that is great, she will let his son [name] know also.  NCM left [name] a vm that patient is for dc this am.          Expected Discharge Plan and Services           Expected Discharge Date: 10/11/20                                     Social Determinants of Health (SDOH) Interventions    Readmission Risk Interventions No flowsheet data found.

## 2020-10-12 ENCOUNTER — Encounter: Payer: Medicare (Managed Care) | Admitting: Vascular Surgery

## 2020-10-12 ENCOUNTER — Encounter (HOSPITAL_COMMUNITY): Payer: Medicare (Managed Care)

## 2020-10-12 NOTE — Telephone Encounter (Signed)
2nd attempt to call patient. No answer, LMOVM

## 2020-10-14 ENCOUNTER — Emergency Department (HOSPITAL_COMMUNITY): Payer: Medicare (Managed Care)

## 2020-10-14 ENCOUNTER — Encounter (HOSPITAL_COMMUNITY): Payer: Self-pay | Admitting: Emergency Medicine

## 2020-10-14 ENCOUNTER — Other Ambulatory Visit: Payer: Self-pay

## 2020-10-14 ENCOUNTER — Emergency Department (HOSPITAL_COMMUNITY)
Admission: EM | Admit: 2020-10-14 | Discharge: 2020-10-14 | Disposition: A | Discharge status: Home / Self Care | Payer: Medicare (Managed Care) | Attending: Emergency Medicine | Admitting: Emergency Medicine

## 2020-10-14 DIAGNOSIS — Z794 Long term (current) use of insulin: Secondary | ICD-10-CM | POA: Insufficient documentation

## 2020-10-14 DIAGNOSIS — Z87891 Personal history of nicotine dependence: Secondary | ICD-10-CM | POA: Diagnosis not present

## 2020-10-14 DIAGNOSIS — Z8521 Personal history of malignant neoplasm of larynx: Secondary | ICD-10-CM | POA: Insufficient documentation

## 2020-10-14 DIAGNOSIS — M25559 Pain in unspecified hip: Secondary | ICD-10-CM

## 2020-10-14 DIAGNOSIS — E1122 Type 2 diabetes mellitus with diabetic chronic kidney disease: Secondary | ICD-10-CM | POA: Diagnosis not present

## 2020-10-14 DIAGNOSIS — M25552 Pain in left hip: Secondary | ICD-10-CM | POA: Diagnosis not present

## 2020-10-14 DIAGNOSIS — Z955 Presence of coronary angioplasty implant and graft: Secondary | ICD-10-CM | POA: Insufficient documentation

## 2020-10-14 DIAGNOSIS — N183 Chronic kidney disease, stage 3 unspecified: Secondary | ICD-10-CM | POA: Diagnosis not present

## 2020-10-14 DIAGNOSIS — I130 Hypertensive heart and chronic kidney disease with heart failure and stage 1 through stage 4 chronic kidney disease, or unspecified chronic kidney disease: Secondary | ICD-10-CM | POA: Diagnosis not present

## 2020-10-14 DIAGNOSIS — I5022 Chronic systolic (congestive) heart failure: Secondary | ICD-10-CM | POA: Diagnosis not present

## 2020-10-14 DIAGNOSIS — Z7982 Long term (current) use of aspirin: Secondary | ICD-10-CM | POA: Diagnosis not present

## 2020-10-14 DIAGNOSIS — Z79899 Other long term (current) drug therapy: Secondary | ICD-10-CM | POA: Diagnosis not present

## 2020-10-14 NOTE — ED Triage Notes (Signed)
Arrives via EMS from home, patient complains of L hip pain, denies new falls or trauma. Was seen at [facility] yesterday for a previous fall but his hip pain was not addressed. CBG 305.

## 2020-10-14 NOTE — Progress Notes (Addendum)
..   Transition of Care [facility]) - Emergency Department Mini Assessment   Patient Details  Name: [name] MRN: [id number] Date of Birth: 11/28/1930  Transition of Care [facility]) CM/SW Contact:    [name], [place] Phone Number: 10/14/2020, 1:06 PM   Clinical Narrative: [facility] CM/CSW consulted with pt.  Pt stated he was in need of assistance at home.  CSW informed pt that [facility] has [facility] and they assist with those needs.  Pt currently needs transportation home.  CSW will contact [facility].  [name], MSW, LCSW-A Pronouns:  She, Her, Hers                  [facility] ED Transitions of CareClinical Social Worker [name] [phone]     ED Mini Assessment: What brought you to the Emergency Department? : Left Hip Pain  Barriers to Discharge: No Barriers Identified        Interventions which prevented an admission or readmission: Transportation [place] or Services    Patient Contact and Communications Key Contact 1: [name]   Spoke with: [name] Date: 10/14/20,     Contact Phone Number: [phone] Call outcome: Pt has [facility], contact [facility] per [facility].  Patient states their goals for this hospitalization and ongoing recovery are:: Pt states he needs assistance at home. CMS Medicare.gov Compare Post Acute Care list provided to:: Patient Choice offered to / list presented to : Patient  Admission diagnosis:  hip pain Patient Active Problem List   Diagnosis Date Noted  . Acute exacerbation of CHF (congestive heart failure) ([place]) 10/09/2020  . Hyponatremia 09/22/2020  . VT (ventricular tachycardia) ([place]) 03/10/2020  . History of recent fall 01/27/2020  . Hyperkalemia 12/05/2019  . Aspiration of food   . Protein-calorie malnutrition, severe 11/22/2019  . Dysphagia 11/21/2019  . Enlarged prostate   . Malignant neoplasm of glottis ([place]) 07/10/2019  . Pharyngeal  dysphagia 07/10/2019  . Dysphonia 07/10/2019  . Hypertensive retinopathy of both eyes 02/05/2018  . Mild nonproliferative diabetic retinopathy of both eyes without macular edema associated with type 2 diabetes mellitus ([place]) 02/05/2018  . Dry eye syndrome of both eyes 06/06/2017  . Weight loss 12/11/2016  . Advanced care planning/counseling discussion 12/11/2016  . Urinary retention 01/13/2016  . Healthcare maintenance 11/22/2015  . [name] (chronic kidney disease) stage 3, GFR 30-59 ml/min (HCC) 12/04/2014  . Automatic implantable cardioverter-defibrillator in situ 01/16/2012  . Constipation 03/10/2009  . Coronary atherosclerosis 12/25/2007  . Chronic systolic congestive heart failure ([place]) 12/25/2007  . Hyperlipidemia 01/02/2007  . Type 2 diabetes mellitus with peripheral vascular disease ([place]) 01/01/1989   PCP:  Robert Filler, MD Pharmacy:   [facility] [place], [address] [place] [address] Phone: [phone] Fax: [phone]

## 2020-10-14 NOTE — Progress Notes (Signed)
TOC CM/CSW spoke with Heather/[facility] transportation [phone].  Driver from [employer] is currently on the way to pick up pt. Pt has a doctor, nurse, and goes to [facility] daily for services, [facility] is not needed at this time.  CSW will continue to follow for dc needs.  Marshea Wisher Tarpley-Carter, MSW, LCSW-A Pronouns:  She, Her, Hers                  [place] ED Transitions of CareClinical Social Worker [email or web address] [phone]

## 2020-10-14 NOTE — ED Notes (Signed)
pt stated that he "needs to be placed somewhere. I cant take care of myself. and I dont have anyone to help" Pt is requesting placement

## 2020-10-14 NOTE — ED Provider Notes (Signed)
[place] DEPT Provider Note   CSN: [id number] Arrival date & time: 10/14/20  0932     History Chief Complaint  Patient presents with  . Hip Pain    [name] is a 87 y.o. male.  HPI   87 year old male presents the emergency department for left hip pain.  Patient had a previous fall a couple days ago, fall from standing, mechanical, low mechanism, no head injury.  Patient states last night he developed soreness in the left hip.  No new fall or injury.  Pain does not radiate into the pelvis or back.  No fever or recent illness.  Past Medical History:  Diagnosis Date  . Adenomatous colon polyp 02/14/2012  . AICD (automatic cardioverter/defibrillator) present    Medtronic   . BBB (bundle branch block)    right  . Carotid stenosis    a. s/p L carotid stent 2004;  b. Carotid US (09/2014): Bilateral ICA 1-39%, left ECA >59%, normal subclavian bilaterally, occluded left vertebral >> FU 2 years  . Chronic kidney disease   . Chronic systolic CHF (congestive heart failure) (HCC)    a. ischemic CM EF 15-20%;  b. s/p AICD 05/24/04;  c. Echo 7/06: EF 30-40%, mild reduced RVSF, d. Echo 12/2015 EF 35-40%  . Elevated PSA   . History of kidney stones    x 1  . HTN (hypertension)   . Hyperlipidemia   . Hyponatremia 09/22/2020  . ICD (implantable cardiac defibrillator) in place 12-25-2012   MDT CRTD upgrade by Dr Lovena Le  . Myocardial infarction ([place]) 1990  . Pericardial effusion    Echocardiogram (09/2014): EF 25% with distal anterior, distal inferior, distal lateral and apical akinesis, grade 1 diastolic dysfunction, very mild aortic stenosis (mean 7 mmHg) - this may be depressed due to low EF (2-D images suggest mild to moderate aortic stenosis), large pericardial effusion, no RA collapse  . Pneumonia   . Presence of permanent cardiac pacemaker    Medtronic  . PVD (peripheral vascular disease) ([place])    s/p L carotid PTCA/stent 2004  . Transient ischemic  attack   . Type II diabetes mellitus ([place])    type II    Patient Active Problem List   Diagnosis Date Noted  . Acute exacerbation of CHF (congestive heart failure) ([place]) 10/09/2020  . Hyponatremia 09/22/2020  . VT (ventricular tachycardia) ([place]) 03/10/2020  . History of recent fall 01/27/2020  . Hyperkalemia 12/05/2019  . Aspiration of food   . Protein-calorie malnutrition, severe 11/22/2019  . Dysphagia 11/21/2019  . Enlarged prostate   . 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 Pharyngeal dysphagia 07/10/2019  . Dysphonia 07/10/2019  . Hypertensive retinopathy of both eyes 02/05/2018  . Mild nonproliferative diabetic retinopathy of both eyes without macular edema associated with type 2 diabetes mellitus ([place]) 02/05/2018  . Dry eye syndrome of both eyes 06/06/2017  . Weight loss 12/11/2016  . Advanced care planning/counseling discussion 12/11/2016  . Urinary retention 01/13/2016  . Healthcare maintenance 11/22/2015  . [name] (chronic kidney disease) stage 3, GFR 30-59 ml/min (HCC) 12/04/2014  . Automatic implantable cardioverter-defibrillator in situ 01/16/2012  . Constipation 03/10/2009  . Coronary atherosclerosis 12/25/2007  . Chronic systolic congestive heart failure ([place]) 12/25/2007  . Hyperlipidemia 01/02/2007  . Type 2 diabetes mellitus with peripheral vascular disease ([place]) 01/01/1989    Past Surgical History:  Procedure Laterality Date  . BI-VENTRICULAR IMPLANTABLE CARDIOVERTER DEFIBRILLATOR UPGRADE N/A 12/25/2012   Procedure: BI-VENTRICULAR IMPLANTABLE CARDIOVERTER DEFIBRILLATOR  UPGRADE;  Surgeon: Marinus Maw, MD;  Location: [facility] CATH LAB;  Service: Cardiovascular;  Laterality: N/A;  . BIV ICD UPGRADE  12/25/2012   MDT CRTD upgrade by Dr Ladona Ridgel for ischemic cardiomyopathy and worsening conduction system disease  . CARDIAC CATHETERIZATION  06/2003,  01/2004  . CARDIAC CATHETERIZATION N/A 01/13/2016   Procedure: Left Heart Cath and Coronary Angiography;  Surgeon:  Corky Crafts, MD;  Location: [facility] INVASIVE CV LAB;  Service: Cardiovascular;  Laterality: N/A;  . CARDIAC DEFIBRILLATOR PLACEMENT  05/24/2004   Implantation of a MDT single-chamber defibrillator  . CAROTID STENT  09/11/2003   Percutaneous transluminal angioplasty and stent placement of the left internal carotid artery.  Marland Kitchen CATARACT EXTRACTION W/ INTRAOCULAR LENS  IMPLANT, BILATERAL Bilateral ~ 2010  . CORONARY ANGIOPLASTY WITH STENT PLACEMENT  1990   "2" (12/25/2012)  . CYSTOSCOPY    . DIRECT LARYNGOSCOPY N/A 08/15/2019   Procedure: MICRODIRECT LARYNGOSCOPY WITH BIOPSY;  Surgeon: Christia Reading, MD;  [facility];  Service: ENT;  Laterality: N/A;  . ESOPHAGOGASTRODUODENOSCOPY (EGD) WITH PROPOFOL N/A 11/22/2019   Procedure: ESOPHAGOGASTRODUODENOSCOPY (EGD) WITH PROPOFOL with possible dilation;  Surgeon: Vida Rigger, MD;  Location: [facility] ENDOSCOPY;  Service: Endoscopy;  Laterality: N/A;  . INSERT / REPLACE / REMOVE PACEMAKER    . IR GASTROSTOMY TUBE MOD SED  11/27/2019  . IR REPLACE G-TUBE SIMPLE WO FLUORO  10/10/2020  . LEAD REVISION N/A 12/25/2012   Procedure: LEAD REVISION;  Surgeon: Marinus Maw, MD;  Location: [facility] CATH LAB;  Service: Cardiovascular;  Laterality: N/A;  . RIGID ESOPHAGOSCOPY N/A 08/15/2019   Procedure: RIGID ESOPHAGOSCOPY;  Surgeon: Christia Reading, MD;  Location: [facility] OR;  Service: ENT;  Laterality: N/A;       Family History  Problem Relation Age of Onset  . Diabetes Mother   . Healthy Father   . Diabetes Brother   . Heart attack Neg Hx   . Stroke Neg Hx     Social History   Tobacco Use  . Smoking status: Former Smoker    Types: Cigarettes    Quit date: 12/27/1967    Years since quitting: 52.8  . Smokeless tobacco: Never Used  Vaping Use  . Vaping Use: Never used  Substance Use Topics  . Alcohol use: No    Alcohol/week: 0.0 standard drinks    Comment: 12/25/2012 "quit all alcohol 60 yr ago"  . Drug use: No    Home Medications Prior to Admission medications    Medication Sig Start Date End Date Taking? Authorizing Provider  Amino Acids-Protein Hydrolys (FEEDING SUPPLEMENT, PRO-STAT SUGAR FREE 64,) LIQD Place 30 mLs into feeding tube daily. 11/30/19   Seawell, Jaimie A, DO  aspirin EC 81 MG tablet 81 mg daily. Per tube    [provider]  atorvastatin (LIPITOR) 40 MG tablet TAKE 1 TABLET(40 MG) BY TUBE DAILY AT 6 PM 10/11/20   Katsadouros, Vasilios, MD  Blood Glucose Monitoring Suppl (ACCU-CHEK AVIVA PLUS) w/Device KIT Check finger stick glucose once daily 05/06/18   Tyson Alias, MD  carvedilol (COREG) 6.25 MG tablet PLACE 1 TABLET INTO FEEDING TUBE TWICE DAILY WITH A MEAL Patient taking differently: Place 6.25 mg into feeding tube 2 (two) times daily with a meal. 09/28/20   Katsadouros, Vasilios, MD  glucose blood (ACCU-CHEK AVIVA PLUS) test strip Check blood sugar up to 3 times a day 09/15/19   Tyson Alias, MD  insulin glargine (LANTUS) 100 UNIT/ML injection Inject 0.08 mLs (8 Units total) into  the skin daily. 12/04/19   Jeanmarie Hubert, MD  Insulin Pen Needle (B-D ULTRAFINE III SHORT PEN) 31G X 8 MM MISC USE ONCE DAILY WITH LANTUS PEN 05/06/18   Axel Filler, MD  lansoprazole (PREVACID SOLUTAB) 30 MG disintegrating tablet Place 30 mg into feeding tube daily.    [provider]  liver oil-zinc oxide (DESITIN) 40 % ointment Apply 1 application topically See admin instructions. Apply twice daily and as needed for irritation    [provider]  Nutritional Supplements (FEEDING SUPPLEMENT, NEPRO CARB STEADY,) LIQD Place 237 mLs into feeding tube 4 (four) times daily. Patient taking differently: Place 240 mLs into feeding tube 4 (four) times daily. 12/08/19   Christian, Rylee, MD  polyethylene glycol (MIRALAX / GLYCOLAX) 17 g packet Place 17 g into feeding tube daily. 10/11/20   Katsadouros, Vasilios, MD  polyvinyl alcohol (LIQUIFILM TEARS) 1.4 % ophthalmic solution Place 1 drop into both eyes 4 (four) times  daily as needed for dry eyes.    [provider]  prochlorperazine (COMPAZINE) 5 MG tablet Place 5 mg into feeding tube 3 (three) times daily as needed for nausea.    [provider]  tamsulosin (FLOMAX) 0.4 MG CAPS capsule Take 2 capsules (0.8 mg total) by mouth at bedtime. Per tube 10/11/20   Katsadouros, Vasilios, MD  triamcinolone (KENALOG) 0.025 % cream Apply 1 application topically 2 (two) times daily as needed (venous stasis dermatitis).    [provider]  trimethoprim-polymyxin b (POLYTRIM) ophthalmic solution Place 1 drop into both eyes 4 (four) times daily. 09/10/20   [provider]  VENTOLIN HFA 108 (90 Base) MCG/ACT inhaler INHALE 2 PUFFS INTO THE LUNGS EVERY 6 HOURS AS NEEDED FOR WHEEZING OR SHORTNESS OF BREATH Patient taking differently: Inhale 2 puffs into the lungs every 6 (six) hours as needed for wheezing or shortness of breath. 03/15/16   Axel Filler, MD    Allergies    Patient has no known allergies.  Review of Systems   Review of Systems  Constitutional: Negative for chills and fever.  HENT: Negative for congestion.   Respiratory: Negative for shortness of breath.   Cardiovascular: Negative for chest pain.  Gastrointestinal: Negative for abdominal pain, diarrhea and vomiting.  Genitourinary: Negative for dysuria.  Neurological: Negative for headaches.    Physical Exam Updated Vital Signs BP 123/74 (BP Location: Left Arm)   Pulse 93   Temp 98.4 F (36.9 C) (Oral)   Resp 16   Ht $R'5\' 11"'LE$  (1.803 m)   Wt 65 kg   SpO2 98%   BMI 19.99 kg/m   Physical Exam Vitals and nursing note reviewed.  Constitutional:      General: He is not in acute distress.    Appearance: Normal appearance.  HENT:     Head: Normocephalic.     Mouth/Throat:     Mouth: Mucous membranes are moist.  Cardiovascular:     Rate and Rhythm: Normal rate.  Pulmonary:     Effort: Pulmonary effort is normal. No respiratory distress.  Abdominal:      Palpations: Abdomen is soft.     Tenderness: There is no abdominal tenderness.  Musculoskeletal:        General: No swelling or deformity.     Comments: No significant tenderness to palpation of the left hip, pelvis is stable  Skin:    General: Skin is warm.  Neurological:     Mental Status: He is alert. Mental status is at baseline.  Psychiatric:        Mood and Affect: Mood normal.     ED Results / Procedures / Treatments   Labs (all labs ordered are listed, but only abnormal results are displayed) Labs Reviewed - No data to display  EKG None  Radiology No results found.  Procedures Procedures (including critical care time)  Medications Ordered in ED Medications - No data to display  ED Course  I have reviewed the triage vital signs and the nursing notes.  Pertinent labs & imaging results that were available during my care of the patient were reviewed by me and considered in my medical decision making (see chart for details).    MDM Rules/Calculators/A&P                          X-ray imaging shows degenerative changes but no acute fracture.  Patient is ambulatory.  Patient had mentioned to the nursing staff that he does not feel comfortable going back to his independent living facility and caring for himself.  [facility], they have arrived to the ER and will provide transport and care for him. He is amendable to leaving, he displays no symptoms of an acute medical issue and is medically cleared.  He offers no new concerns.  Final Clinical Impression(s) / ED Diagnoses Final diagnoses:  None    Rx / DC Orders ED Discharge Orders    None       Lorelle Gibbs, DO 10/14/20 1345

## 2020-10-17 ENCOUNTER — Other Ambulatory Visit: Payer: Self-pay

## 2020-10-17 ENCOUNTER — Inpatient Hospital Stay (HOSPITAL_COMMUNITY)
Admission: EM | Admit: 2020-10-17 | Discharge: 2020-10-21 | DRG: 291 | Disposition: A | Discharge status: Home Health | Payer: Medicare (Managed Care) | Attending: Family Medicine | Admitting: Family Medicine

## 2020-10-17 ENCOUNTER — Encounter (HOSPITAL_COMMUNITY): Payer: Self-pay

## 2020-10-17 ENCOUNTER — Emergency Department (HOSPITAL_COMMUNITY): Payer: Medicare (Managed Care)

## 2020-10-17 DIAGNOSIS — E871 Hypo-osmolality and hyponatremia: Secondary | ICD-10-CM | POA: Diagnosis present

## 2020-10-17 DIAGNOSIS — R54 Age-related physical debility: Secondary | ICD-10-CM | POA: Diagnosis present

## 2020-10-17 DIAGNOSIS — Z95810 Presence of automatic (implantable) cardiac defibrillator: Secondary | ICD-10-CM

## 2020-10-17 DIAGNOSIS — R0600 Dyspnea, unspecified: Secondary | ICD-10-CM

## 2020-10-17 DIAGNOSIS — Z794 Long term (current) use of insulin: Secondary | ICD-10-CM

## 2020-10-17 DIAGNOSIS — I313 Pericardial effusion (noninflammatory): Secondary | ICD-10-CM | POA: Diagnosis present

## 2020-10-17 DIAGNOSIS — E869 Volume depletion, unspecified: Secondary | ICD-10-CM | POA: Diagnosis present

## 2020-10-17 DIAGNOSIS — Z931 Gastrostomy status: Secondary | ICD-10-CM

## 2020-10-17 DIAGNOSIS — Z833 Family history of diabetes mellitus: Secondary | ICD-10-CM

## 2020-10-17 DIAGNOSIS — I454 Nonspecific intraventricular block: Secondary | ICD-10-CM | POA: Diagnosis present

## 2020-10-17 DIAGNOSIS — Z87891 Personal history of nicotine dependence: Secondary | ICD-10-CM

## 2020-10-17 DIAGNOSIS — L89152 Pressure ulcer of sacral region, stage 2: Secondary | ICD-10-CM | POA: Diagnosis present

## 2020-10-17 DIAGNOSIS — M25552 Pain in left hip: Secondary | ICD-10-CM | POA: Diagnosis present

## 2020-10-17 DIAGNOSIS — N179 Acute kidney failure, unspecified: Secondary | ICD-10-CM | POA: Diagnosis present

## 2020-10-17 DIAGNOSIS — E870 Hyperosmolality and hypernatremia: Secondary | ICD-10-CM | POA: Diagnosis present

## 2020-10-17 DIAGNOSIS — E1122 Type 2 diabetes mellitus with diabetic chronic kidney disease: Secondary | ICD-10-CM | POA: Diagnosis present

## 2020-10-17 DIAGNOSIS — E785 Hyperlipidemia, unspecified: Secondary | ICD-10-CM | POA: Diagnosis present

## 2020-10-17 DIAGNOSIS — E1151 Type 2 diabetes mellitus with diabetic peripheral angiopathy without gangrene: Secondary | ICD-10-CM | POA: Diagnosis present

## 2020-10-17 DIAGNOSIS — Z20822 Contact with and (suspected) exposure to covid-19: Secondary | ICD-10-CM | POA: Diagnosis present

## 2020-10-17 DIAGNOSIS — K5900 Constipation, unspecified: Secondary | ICD-10-CM | POA: Diagnosis present

## 2020-10-17 DIAGNOSIS — R1310 Dysphagia, unspecified: Secondary | ICD-10-CM | POA: Diagnosis present

## 2020-10-17 DIAGNOSIS — G9341 Metabolic encephalopathy: Secondary | ICD-10-CM | POA: Diagnosis not present

## 2020-10-17 DIAGNOSIS — Z9119 Patient's noncompliance with other medical treatment and regimen: Secondary | ICD-10-CM

## 2020-10-17 DIAGNOSIS — Z79899 Other long term (current) drug therapy: Secondary | ICD-10-CM

## 2020-10-17 DIAGNOSIS — I252 Old myocardial infarction: Secondary | ICD-10-CM

## 2020-10-17 DIAGNOSIS — Z86010 Personal history of colonic polyps: Secondary | ICD-10-CM

## 2020-10-17 DIAGNOSIS — J9601 Acute respiratory failure with hypoxia: Secondary | ICD-10-CM | POA: Diagnosis present

## 2020-10-17 DIAGNOSIS — Z681 Body mass index (BMI) 19 or less, adult: Secondary | ICD-10-CM

## 2020-10-17 DIAGNOSIS — I5043 Acute on chronic combined systolic (congestive) and diastolic (congestive) heart failure: Secondary | ICD-10-CM | POA: Diagnosis not present

## 2020-10-17 DIAGNOSIS — E43 Unspecified severe protein-calorie malnutrition: Secondary | ICD-10-CM | POA: Diagnosis present

## 2020-10-17 DIAGNOSIS — I2510 Atherosclerotic heart disease of native coronary artery without angina pectoris: Secondary | ICD-10-CM | POA: Diagnosis present

## 2020-10-17 DIAGNOSIS — M1612 Unilateral primary osteoarthritis, left hip: Secondary | ICD-10-CM | POA: Diagnosis present

## 2020-10-17 DIAGNOSIS — I130 Hypertensive heart and chronic kidney disease with heart failure and stage 1 through stage 4 chronic kidney disease, or unspecified chronic kidney disease: Principal | ICD-10-CM | POA: Diagnosis present

## 2020-10-17 DIAGNOSIS — C320 Malignant neoplasm of glottis: Secondary | ICD-10-CM | POA: Diagnosis present

## 2020-10-17 DIAGNOSIS — E11311 Type 2 diabetes mellitus with unspecified diabetic retinopathy with macular edema: Secondary | ICD-10-CM | POA: Diagnosis present

## 2020-10-17 DIAGNOSIS — N184 Chronic kidney disease, stage 4 (severe): Secondary | ICD-10-CM | POA: Diagnosis present

## 2020-10-17 LAB — CBC WITH DIFFERENTIAL/PLATELET
Abs Immature Granulocytes: 0.02 10*3/uL (ref 0.00–0.07)
Basophils Absolute: 0.1 10*3/uL (ref 0.0–0.1)
Basophils Relative: 1 %
Eosinophils Absolute: 0.4 10*3/uL (ref 0.0–0.5)
Eosinophils Relative: 4 %
HCT: 40.6 % (ref 39.0–52.0)
Hemoglobin: 12.6 g/dL — ABNORMAL LOW (ref 13.0–17.0)
Immature Granulocytes: 0 %
Lymphocytes Relative: 18 %
Lymphs Abs: 1.8 10*3/uL (ref 0.7–4.0)
MCH: 30.5 pg (ref 26.0–34.0)
MCHC: 31 g/dL (ref 30.0–36.0)
MCV: 98.3 fL (ref 80.0–100.0)
Monocytes Absolute: 1 10*3/uL (ref 0.1–1.0)
Monocytes Relative: 10 %
Neutro Abs: 6.8 10*3/uL (ref 1.7–7.7)
Neutrophils Relative %: 67 %
Platelets: 225 10*3/uL (ref 150–400)
RBC: 4.13 MIL/uL — ABNORMAL LOW (ref 4.22–5.81)
RDW: 14.3 % (ref 11.5–15.5)
WBC: 10.1 10*3/uL (ref 4.0–10.5)
nRBC: 0 % (ref 0.0–0.2)

## 2020-10-17 LAB — BASIC METABOLIC PANEL
Anion gap: 11 (ref 5–15)
BUN: 111 mg/dL — ABNORMAL HIGH (ref 8–23)
CO2: 28 mmol/L (ref 22–32)
Calcium: 10.1 mg/dL (ref 8.9–10.3)
Chloride: 104 mmol/L (ref 98–111)
Creatinine, Ser: 2.12 mg/dL — ABNORMAL HIGH (ref 0.61–1.24)
GFR, Estimated: 30 mL/min — ABNORMAL LOW (ref >60)
Glucose, Bld: 369 mg/dL — ABNORMAL HIGH (ref 70–99)
Potassium: 4.9 mmol/L (ref 3.5–5.1)
Sodium: 143 mmol/L (ref 135–145)

## 2020-10-17 LAB — BRAIN NATRIURETIC PEPTIDE: B Natriuretic Peptide: 970.3 pg/mL — ABNORMAL HIGH (ref 0.0–100.0)

## 2020-10-17 LAB — CBG MONITORING, ED: Glucose-Capillary: 276 mg/dL — ABNORMAL HIGH (ref 70–99)

## 2020-10-17 MED ORDER — SODIUM CHLORIDE 0.9 % IV BOLUS
500.0000 mL | Freq: Once | INTRAVENOUS | Status: AC
  Administered 2020-10-17: 500 mL via INTRAVENOUS

## 2020-10-17 MED ORDER — MINERAL OIL RE ENEM
1.0000 | ENEMA | Freq: Once | RECTAL | Status: AC
  Administered 2020-10-17: 1 via RECTAL
  Filled 2020-10-17: qty 1

## 2020-10-17 MED ORDER — SODIUM CHLORIDE 0.9 % IV SOLN: INTRAVENOUS | Status: DC

## 2020-10-17 MED ORDER — INSULIN GLARGINE 100 UNIT/ML ~~LOC~~ SOLN
8.0000 [IU] | Freq: Every day | SUBCUTANEOUS | Status: DC
  Administered 2020-10-17 – 2020-10-21 (×5): 8 [IU] via SUBCUTANEOUS
  Filled 2020-10-17 (×5): qty 0.08

## 2020-10-17 MED ORDER — FUROSEMIDE 10 MG/ML IJ SOLN
40.0000 mg | Freq: Once | INTRAMUSCULAR | Status: AC
  Administered 2020-10-17: 40 mg via INTRAVENOUS
  Filled 2020-10-17: qty 4

## 2020-10-17 NOTE — ED Provider Notes (Signed)
[facility]-EMERGENCY DEPT Provider Note   CSN: [id number] Arrival date & time: 10/17/20  1746     History Chief Complaint  Patient presents with  . Constipation    Marky E Delillo is a 87 y.o. male.  87-year-old male presents with constipation x3 days.  Denies any abdominal discomfort.  Denies any emesis.  No fever or chills.  Patient also states noncompliance with his insulin x2 days.  Notes polydipsia but denies any polyuria.  Normally is supposed to take laxatives but has been noncompliant.        Past Medical History:  Diagnosis Date  . Adenomatous colon polyp 02/14/2012  . AICD (automatic cardioverter/defibrillator) present    Medtronic   . BBB (bundle branch block)    right  . Carotid stenosis    a. s/p L carotid stent 2004;  b. Carotid US (09/2014): Bilateral ICA 1-39%, left ECA >59%, normal subclavian bilaterally, occluded left vertebral >> FU 2 years  . Chronic kidney disease   . Chronic systolic CHF (congestive heart failure) (HCC)    a. ischemic CM EF 15-20%;  b. s/p AICD 05/24/04;  c. Echo 7/06: EF 30-40%, mild reduced RVSF, d. Echo 12/2015 EF 35-40%  . Elevated PSA   . History of kidney stones    x 1  . HTN (hypertension)   . Hyperlipidemia   . Hyponatremia 09/22/2020  . ICD (implantable cardiac defibrillator) in place 12-25-2012   MDT CRTD upgrade by Dr Taylor  . Myocardial infarction (HCC) 1990  . Pericardial effusion    Echocardiogram (09/2014): EF 25% with distal anterior, distal inferior, distal lateral and apical akinesis, grade 1 diastolic dysfunction, very mild aortic stenosis (mean 7 mmHg) - this may be depressed due to low EF (2-D images suggest mild to moderate aortic stenosis), large pericardial effusion, no RA collapse  . Pneumonia   . Presence of permanent cardiac pacemaker    Medtronic  . PVD (peripheral vascular disease) (HCC)    s/p L carotid PTCA/stent 2004  . Transient ischemic attack   . Type II diabetes mellitus  (HCC)    type II    Patient Active Problem List   Diagnosis Date Noted  . Acute exacerbation of CHF (congestive heart failure) (HCC) 10/09/2020  . Hyponatremia 09/22/2020  . VT (ventricular tachycardia) (HCC) 03/10/2020  . History of recent fall 01/27/2020  . Hyperkalemia 12/05/2019  . Aspiration of food   . Protein-calorie malnutrition, severe 11/22/2019  . Dysphagia 11/21/2019  . Enlarged prostate   . Malignant neoplasm of glottis (HCC) 07/10/2019  . Pharyngeal dysphagia 07/10/2019  . Dysphonia 07/10/2019  . Hypertensive retinopathy of both eyes 02/05/2018  . Mild nonproliferative diabetic retinopathy of both eyes without macular edema associated with type 2 diabetes mellitus (HCC) 02/05/2018  . Dry eye syndrome of both eyes 06/06/2017  . Weight loss 12/11/2016  . Advanced care planning/counseling discussion 12/11/2016  . Urinary retention 01/13/2016  . Healthcare maintenance 11/22/2015  . [name] (chronic kidney disease) stage 3, GFR 30-59 ml/min (HCC) 12/04/2014  . Automatic implantable cardioverter-defibrillator in situ 01/16/2012  . Constipation 03/10/2009  . Coronary atherosclerosis 12/25/2007  . Chronic systolic congestive heart failure (HCC) 12/25/2007  . Hyperlipidemia 01/02/2007  . Type 2 diabetes mellitus with peripheral vascular disease (HCC) 01/01/1989    Past Surgical History:  Procedure Laterality Date  . BI-VENTRICULAR IMPLANTABLE CARDIOVERTER DEFIBRILLATOR UPGRADE N/A 12/25/2012   Procedure: BI-VENTRICULAR IMPLANTABLE CARDIOVERTER DEFIBRILLATOR UPGRADE;  Surgeon: Gregg W Taylor, MD;  Location: [facility] CATH LAB;    Service: Cardiovascular;  Laterality: N/A;  . BIV ICD UPGRADE  12/25/2012   MDT CRTD upgrade by Dr Lovena Le for ischemic cardiomyopathy and worsening conduction system disease  . CARDIAC CATHETERIZATION  06/2003,  01/2004  . CARDIAC CATHETERIZATION N/A 01/13/2016   Procedure: Left Heart Cath and Coronary Angiography;  Surgeon: Jettie Booze, MD;  Location: [place] CV LAB;  Service: Cardiovascular;  Laterality: N/A;  . CARDIAC DEFIBRILLATOR PLACEMENT  05/24/2004   Implantation of a MDT single-chamber defibrillator  . CAROTID STENT  09/11/2003   Percutaneous transluminal angioplasty and stent placement of the left internal carotid artery.  Marland Kitchen CATARACT EXTRACTION W/ INTRAOCULAR LENS  IMPLANT, BILATERAL Bilateral ~ 2010  . CORONARY ANGIOPLASTY WITH STENT PLACEMENT  1990   "2" (12/25/2012)  . CYSTOSCOPY    . DIRECT LARYNGOSCOPY N/A 08/15/2019   Procedure: MICRODIRECT LARYNGOSCOPY WITH BIOPSY;  Surgeon: Melida Quitter, MD;  Location: [facility];  Service: ENT;  Laterality: N/A;  . ESOPHAGOGASTRODUODENOSCOPY (EGD) WITH PROPOFOL N/A 11/22/2019   Procedure: ESOPHAGOGASTRODUODENOSCOPY (EGD) WITH PROPOFOL with possible dilation;  Surgeon: Clarene Essex, MD;  Location: [facility];  Service: Endoscopy;  Laterality: N/A;  . INSERT / REPLACE / REMOVE PACEMAKER    . IR GASTROSTOMY TUBE MOD SED  11/27/2019  . IR REPLACE G-TUBE SIMPLE WO FLUORO  10/10/2020  . LEAD REVISION N/A 12/25/2012   Procedure: LEAD REVISION;  Surgeon: Evans Lance, MD;  Location: [facility] CATH LAB;  Service: Cardiovascular;  Laterality: N/A;  . RIGID ESOPHAGOSCOPY N/A 08/15/2019   Procedure: RIGID ESOPHAGOSCOPY;  Surgeon: Melida Quitter, MD;  Location: [facility] OR;  Service: ENT;  Laterality: N/A;       Family History  Problem Relation Age of Onset  . Diabetes Mother   . Healthy Father   . Diabetes Brother   . Heart attack Neg Hx   . Stroke Neg Hx     Social History   Tobacco Use  . Smoking status: Former Smoker    Types: Cigarettes    Quit date: 12/27/1967    Years since quitting: 52.8  . Smokeless tobacco: Never Used  Vaping Use  . Vaping Use: Never used  Substance Use Topics  . Alcohol use: No    Alcohol/week: 0.0 standard drinks    Comment: 12/25/2012 "quit all alcohol 60 yr ago"  . Drug use: No    Home Medications Prior to Admission medications   Medication Sig Start Date End Date  Taking? Authorizing Provider  Amino Acids-Protein Hydrolys (FEEDING SUPPLEMENT, PRO-STAT SUGAR FREE 64,) LIQD Place 30 mLs into feeding tube daily. 11/30/19   Seawell, Jaimie A, DO  aspirin EC 81 MG tablet 81 mg daily. Per tube    [provider]  atorvastatin (LIPITOR) 40 MG tablet TAKE 1 TABLET(40 MG) BY TUBE DAILY AT 6 PM 10/11/20   Katsadouros, Vasilios, MD  Blood Glucose Monitoring Suppl (ACCU-CHEK AVIVA PLUS) w/Device KIT Check finger stick glucose once daily 05/06/18   Axel Filler, MD  carvedilol (COREG) 6.25 MG tablet PLACE 1 TABLET INTO FEEDING TUBE TWICE DAILY WITH A MEAL Patient taking differently: Place 6.25 mg into feeding tube 2 (two) times daily with a meal. 09/28/20   Katsadouros, Vasilios, MD  glucose blood (ACCU-CHEK AVIVA PLUS) test strip Check blood sugar up to 3 times a day 09/15/19   Axel Filler, MD  insulin glargine (LANTUS) 100 UNIT/ML injection Inject 0.08 mLs (8 Units total) into the skin daily. 12/04/19   Jeanmarie Hubert, MD  Insulin Pen Needle (  B-D ULTRAFINE III SHORT PEN) 31G X 8 MM MISC USE ONCE DAILY WITH LANTUS PEN 05/06/18   Axel Filler, MD  lansoprazole (PREVACID SOLUTAB) 30 MG disintegrating tablet Place 30 mg into feeding tube daily.    [provider]  liver oil-zinc oxide (DESITIN) 40 % ointment Apply 1 application topically See admin instructions. Apply twice daily and as needed for irritation    [provider]  Nutritional Supplements (FEEDING SUPPLEMENT, NEPRO CARB STEADY,) LIQD Place 237 mLs into feeding tube 4 (four) times daily. Patient taking differently: Place 240 mLs into feeding tube 4 (four) times daily. 12/08/19   Christian, Rylee, MD  polyethylene glycol (MIRALAX / GLYCOLAX) 17 g packet Place 17 g into feeding tube daily. 10/11/20   Katsadouros, Vasilios, MD  polyvinyl alcohol (LIQUIFILM TEARS) 1.4 % ophthalmic solution Place 1 drop into both eyes 4 (four) times daily as needed for dry eyes.     [provider]  prochlorperazine (COMPAZINE) 5 MG tablet Place 5 mg into feeding tube 3 (three) times daily as needed for nausea.    [provider]  tamsulosin (FLOMAX) 0.4 MG CAPS capsule Take 2 capsules (0.8 mg total) by mouth at bedtime. Per tube 10/11/20   Katsadouros, Vasilios, MD  triamcinolone (KENALOG) 0.025 % cream Apply 1 application topically 2 (two) times daily as needed (venous stasis dermatitis).    [provider]  trimethoprim-polymyxin b (POLYTRIM) ophthalmic solution Place 1 drop into both eyes 4 (four) times daily. 09/10/20   [provider]  VENTOLIN HFA 108 (90 Base) MCG/ACT inhaler INHALE 2 PUFFS INTO THE LUNGS EVERY 6 HOURS AS NEEDED FOR WHEEZING OR SHORTNESS OF BREATH Patient taking differently: Inhale 2 puffs into the lungs every 6 (six) hours as needed for wheezing or shortness of breath. 03/15/16   Axel Filler, MD    Allergies    Patient has no known allergies.  Review of Systems   Review of Systems  All other systems reviewed and are negative.   Physical Exam Updated Vital Signs BP 123/62   Pulse 85   Temp 98.5 F (36.9 C)   Resp 18   Ht 1.803 m (5' 11")   Wt 65 kg   SpO2 100%   BMI 19.99 kg/m   Physical Exam Vitals and nursing note reviewed.  Constitutional:      General: He is not in acute distress.    Appearance: Normal appearance. He is well-developed and well-nourished. He is not toxic-appearing.  HENT:     Head: Normocephalic and atraumatic.  Eyes:     General: Lids are normal.     Extraocular Movements: EOM normal.     Conjunctiva/sclera: Conjunctivae normal.     Pupils: Pupils are equal, round, and reactive to light.  Neck:     Thyroid: No thyroid mass.     Trachea: No tracheal deviation.  Cardiovascular:     Rate and Rhythm: Normal rate and regular rhythm.     Heart sounds: Normal heart sounds. No murmur heard. No gallop.   Pulmonary:     Effort: Pulmonary effort is normal. No  respiratory distress.     Breath sounds: Normal breath sounds. No stridor. No decreased breath sounds, wheezing, rhonchi or rales.  Abdominal:     General: Bowel sounds are normal. There is no distension.     Palpations: Abdomen is soft.     Tenderness: There is no abdominal tenderness. There is no CVA tenderness or rebound.    Musculoskeletal:  General: No tenderness or edema. Normal range of motion.     Cervical back: Normal range of motion and neck supple.  Skin:    General: Skin is warm and dry.     Findings: No abrasion or rash.  Neurological:     Mental Status: He is alert and oriented to person, place, and time.     GCS: GCS eye subscore is 4. GCS verbal subscore is 5. GCS motor subscore is 6.     Cranial Nerves: No cranial nerve deficit.     Sensory: No sensory deficit.     Deep Tendon Reflexes: Strength normal.  Psychiatric:        Mood and Affect: Mood and affect normal.        Speech: Speech normal.        Behavior: Behavior normal.     ED Results / Procedures / Treatments   Labs (all labs ordered are listed, but only abnormal results are displayed) Labs Reviewed  CBC WITH DIFFERENTIAL/PLATELET  BASIC METABOLIC PANEL    EKG EKG Interpretation  Date/Time:  Sunday October 17 2020 19:48:19 EST Ventricular Rate:  92 PR Interval:    QRS Duration: 167 QT Interval:  452 QTC Calculation: 560 R Axis:   -104 Text Interpretation: Age not entered, assumed to be  87 years old for purpose of ECG interpretation Sinus rhythm Right bundle branch block Anterolateral infarct, age indeterminate No significant change since last tracing Confirmed by ,  ([id number]) on 10/17/2020 10:24:26 PM   Radiology No results found.  Procedures Procedures (including critical care time)  Medications Ordered in ED Medications  sodium chloride 0.9 % bolus 500 mL (has no administration in time range)  0.9 %  sodium chloride infusion (has no administration in time range)   insulin glargine (LANTUS) injection 8 Units (has no administration in time range)    ED Course  I have reviewed the triage vital signs and the nursing notes.  Pertinent labs & imaging results that were available during my care of the patient were reviewed by me and considered in my medical decision making (see chart for details).    MDM Rules/Calculators/A&P                         Patient given fleets enema for mild constipation. Patient initially given IV fluids for hyperglycemia.  Also given home dose of insulin.  Subsequent x-ray showed findings suspicious for CHF this was confirmed by elevated BNP.  Was subsequently given Lasix 40 mg IV push.  Will be admitted for CHF Final Clinical Impression(s) / ED Diagnoses Final diagnoses:  None    Rx / DC Orders ED Discharge Orders    None       , , MD 10/17/20 2225  

## 2020-10-17 NOTE — ED Triage Notes (Signed)
Resides at [facility]. Patient complaining of no BM x4 days. Prescribed mirlax, non compliant. [id number], IDDM, no Insulin x36hrs due to saying he don't need it.

## 2020-10-17 NOTE — ED Notes (Addendum)
Pt placed on 4L [place] d/t O2 desat to 87%. Pt O2 at 91% on 4L

## 2020-10-17 NOTE — ED Notes (Signed)
No bowel produced upon enema administration

## 2020-10-17 NOTE — ED Notes (Signed)
Pt producing thick, clear sputum

## 2020-10-18 ENCOUNTER — Observation Stay (HOSPITAL_COMMUNITY): Payer: Medicare (Managed Care)

## 2020-10-18 DIAGNOSIS — N184 Chronic kidney disease, stage 4 (severe): Secondary | ICD-10-CM | POA: Diagnosis not present

## 2020-10-18 DIAGNOSIS — E869 Volume depletion, unspecified: Secondary | ICD-10-CM | POA: Diagnosis present

## 2020-10-18 DIAGNOSIS — C320 Malignant neoplasm of glottis: Secondary | ICD-10-CM

## 2020-10-18 DIAGNOSIS — R0600 Dyspnea, unspecified: Secondary | ICD-10-CM | POA: Diagnosis not present

## 2020-10-18 DIAGNOSIS — J9601 Acute respiratory failure with hypoxia: Secondary | ICD-10-CM | POA: Diagnosis present

## 2020-10-18 DIAGNOSIS — E43 Unspecified severe protein-calorie malnutrition: Secondary | ICD-10-CM

## 2020-10-18 DIAGNOSIS — I5043 Acute on chronic combined systolic (congestive) and diastolic (congestive) heart failure: Secondary | ICD-10-CM

## 2020-10-18 DIAGNOSIS — N179 Acute kidney failure, unspecified: Secondary | ICD-10-CM | POA: Diagnosis present

## 2020-10-18 DIAGNOSIS — K5900 Constipation, unspecified: Secondary | ICD-10-CM

## 2020-10-18 DIAGNOSIS — Z681 Body mass index (BMI) 19 or less, adult: Secondary | ICD-10-CM | POA: Diagnosis not present

## 2020-10-18 DIAGNOSIS — Z431 Encounter for attention to gastrostomy: Secondary | ICD-10-CM

## 2020-10-18 DIAGNOSIS — Z86010 Personal history of colonic polyps: Secondary | ICD-10-CM | POA: Diagnosis not present

## 2020-10-18 DIAGNOSIS — Z515 Encounter for palliative care: Secondary | ICD-10-CM | POA: Diagnosis not present

## 2020-10-18 DIAGNOSIS — G9341 Metabolic encephalopathy: Secondary | ICD-10-CM | POA: Diagnosis not present

## 2020-10-18 DIAGNOSIS — E1151 Type 2 diabetes mellitus with diabetic peripheral angiopathy without gangrene: Secondary | ICD-10-CM

## 2020-10-18 DIAGNOSIS — I454 Nonspecific intraventricular block: Secondary | ICD-10-CM | POA: Diagnosis present

## 2020-10-18 DIAGNOSIS — R5381 Other malaise: Secondary | ICD-10-CM

## 2020-10-18 DIAGNOSIS — E871 Hypo-osmolality and hyponatremia: Secondary | ICD-10-CM | POA: Diagnosis present

## 2020-10-18 DIAGNOSIS — E1122 Type 2 diabetes mellitus with diabetic chronic kidney disease: Secondary | ICD-10-CM | POA: Diagnosis present

## 2020-10-18 DIAGNOSIS — E785 Hyperlipidemia, unspecified: Secondary | ICD-10-CM | POA: Diagnosis present

## 2020-10-18 DIAGNOSIS — E870 Hyperosmolality and hypernatremia: Secondary | ICD-10-CM

## 2020-10-18 DIAGNOSIS — I313 Pericardial effusion (noninflammatory): Secondary | ICD-10-CM | POA: Diagnosis present

## 2020-10-18 DIAGNOSIS — Z95810 Presence of automatic (implantable) cardiac defibrillator: Secondary | ICD-10-CM | POA: Diagnosis not present

## 2020-10-18 DIAGNOSIS — R1310 Dysphagia, unspecified: Secondary | ICD-10-CM | POA: Diagnosis present

## 2020-10-18 DIAGNOSIS — Z20822 Contact with and (suspected) exposure to covid-19: Secondary | ICD-10-CM | POA: Diagnosis present

## 2020-10-18 DIAGNOSIS — Z7189 Other specified counseling: Secondary | ICD-10-CM

## 2020-10-18 DIAGNOSIS — Z79899 Other long term (current) drug therapy: Secondary | ICD-10-CM | POA: Diagnosis not present

## 2020-10-18 DIAGNOSIS — I130 Hypertensive heart and chronic kidney disease with heart failure and stage 1 through stage 4 chronic kidney disease, or unspecified chronic kidney disease: Secondary | ICD-10-CM | POA: Diagnosis present

## 2020-10-18 DIAGNOSIS — Z794 Long term (current) use of insulin: Secondary | ICD-10-CM | POA: Diagnosis not present

## 2020-10-18 LAB — ECHOCARDIOGRAM COMPLETE
AR max vel: 0.71 cm2
AV Area VTI: 0.66 cm2
AV Area mean vel: 0.63 cm2
AV Mean grad: 11.3 mmHg
AV Peak grad: 18.7 mmHg
Ao pk vel: 2.16 m/s
Area-P 1/2: 4.49 cm2
Height: 71 in
S' Lateral: 4.1 cm
Weight: 2292.78 oz

## 2020-10-18 LAB — DIFFERENTIAL
Abs Immature Granulocytes: 0.06 10*3/uL (ref 0.00–0.07)
Band Neutrophils: 0 %
Basophils Absolute: 0.1 10*3/uL (ref 0.0–0.1)
Basophils Relative: 1 %
Blasts: 0 %
Eosinophils Absolute: 0.2 10*3/uL (ref 0.0–0.5)
Eosinophils Relative: 1 %
Immature Granulocytes: 0 %
Lymphocytes Relative: 12 %
Lymphs Abs: 1.8 10*3/uL (ref 0.7–4.0)
Metamyelocytes Relative: 0 %
Monocytes Absolute: 1.6 10*3/uL — ABNORMAL HIGH (ref 0.1–1.0)
Monocytes Relative: 10 %
Myelocytes: 0 %
Neutro Abs: 11.6 10*3/uL — ABNORMAL HIGH (ref 1.7–7.7)
Neutrophils Relative %: 76 %
Promyelocytes Relative: 0 %
nRBC: 0 /100 WBC

## 2020-10-18 LAB — CBC
HCT: 41.3 % (ref 39.0–52.0)
Hemoglobin: 13 g/dL (ref 13.0–17.0)
MCH: 31 pg (ref 26.0–34.0)
MCHC: 31.5 g/dL (ref 30.0–36.0)
MCV: 98.6 fL (ref 80.0–100.0)
Platelets: 198 10*3/uL (ref 150–400)
RBC: 4.19 MIL/uL — ABNORMAL LOW (ref 4.22–5.81)
RDW: 14.6 % (ref 11.5–15.5)
WBC: 15.7 10*3/uL — ABNORMAL HIGH (ref 4.0–10.5)
nRBC: 0 % (ref 0.0–0.2)

## 2020-10-18 LAB — RENAL FUNCTION PANEL
Albumin: 3.5 g/dL (ref 3.5–5.0)
Anion gap: 15 (ref 5–15)
BUN: 114 mg/dL — ABNORMAL HIGH (ref 8–23)
CO2: 24 mmol/L (ref 22–32)
Calcium: 10.1 mg/dL (ref 8.9–10.3)
Chloride: 109 mmol/L (ref 98–111)
Creatinine, Ser: 2.27 mg/dL — ABNORMAL HIGH (ref 0.61–1.24)
GFR, Estimated: 27 mL/min — ABNORMAL LOW (ref >60)
Glucose, Bld: 177 mg/dL — ABNORMAL HIGH (ref 70–99)
Phosphorus: 3.2 mg/dL (ref 2.5–4.6)
Potassium: 4.5 mmol/L (ref 3.5–5.1)
Sodium: 148 mmol/L — ABNORMAL HIGH (ref 135–145)

## 2020-10-18 LAB — URINALYSIS, ROUTINE W REFLEX MICROSCOPIC
Bilirubin Urine: NEGATIVE
Glucose, UA: NEGATIVE mg/dL
Hgb urine dipstick: NEGATIVE
Ketones, ur: NEGATIVE mg/dL
Nitrite: NEGATIVE
Protein, ur: NEGATIVE mg/dL
Specific Gravity, Urine: 1.013 (ref 1.005–1.030)
pH: 6 (ref 5.0–8.0)

## 2020-10-18 LAB — CBG MONITORING, ED
Glucose-Capillary: 154 mg/dL — ABNORMAL HIGH (ref 70–99)
Glucose-Capillary: 172 mg/dL — ABNORMAL HIGH (ref 70–99)
Glucose-Capillary: 194 mg/dL — ABNORMAL HIGH (ref 70–99)
Glucose-Capillary: 211 mg/dL — ABNORMAL HIGH (ref 70–99)
Glucose-Capillary: 298 mg/dL — ABNORMAL HIGH (ref 70–99)

## 2020-10-18 LAB — PROCALCITONIN: Procalcitonin: 0.11 ng/mL

## 2020-10-18 LAB — MAGNESIUM: Magnesium: 2.3 mg/dL (ref 1.7–2.4)

## 2020-10-18 LAB — GLUCOSE, CAPILLARY: Glucose-Capillary: 310 mg/dL — ABNORMAL HIGH (ref 70–99)

## 2020-10-18 LAB — SARS CORONAVIRUS 2 (TAT 6-24 HRS): SARS Coronavirus 2: NEGATIVE

## 2020-10-18 MED ORDER — PANTOPRAZOLE SODIUM 40 MG PO PACK
40.0000 mg | PACK | Freq: Every day | ORAL | Status: DC
  Administered 2020-10-18 – 2020-10-21 (×4): 40 mg
  Filled 2020-10-18 (×4): qty 20

## 2020-10-18 MED ORDER — POLYMYXIN B-TRIMETHOPRIM 10000-0.1 UNIT/ML-% OP SOLN
1.0000 [drp] | Freq: Four times a day (QID) | OPHTHALMIC | Status: DC
  Administered 2020-10-18 – 2020-10-21 (×10): 1 [drp] via OPHTHALMIC
  Filled 2020-10-18: qty 10

## 2020-10-18 MED ORDER — PROCHLORPERAZINE MALEATE 10 MG PO TABS: 5.0000 mg | ORAL_TABLET | Freq: Three times a day (TID) | ORAL | Status: DC | PRN

## 2020-10-18 MED ORDER — SODIUM CHLORIDE 0.9% FLUSH
3.0000 mL | Freq: Two times a day (BID) | INTRAVENOUS | Status: DC
  Administered 2020-10-18 – 2020-10-21 (×7): 3 mL via INTRAVENOUS

## 2020-10-18 MED ORDER — POLYVINYL ALCOHOL 1.4 % OP SOLN
1.0000 [drp] | Freq: Four times a day (QID) | OPHTHALMIC | Status: DC | PRN
  Filled 2020-10-18: qty 15

## 2020-10-18 MED ORDER — INSULIN ASPART 100 UNIT/ML ~~LOC~~ SOLN
0.0000 [IU] | SUBCUTANEOUS | Status: DC
  Administered 2020-10-18 (×2): 1 [IU] via SUBCUTANEOUS
  Administered 2020-10-18: 2 [IU] via SUBCUTANEOUS
  Administered 2020-10-18: 3 [IU] via SUBCUTANEOUS
  Administered 2020-10-18: 1 [IU] via SUBCUTANEOUS
  Administered 2020-10-19 (×2): 4 [IU] via SUBCUTANEOUS
  Filled 2020-10-18: qty 0.06

## 2020-10-18 MED ORDER — ATORVASTATIN CALCIUM 40 MG PO TABS
40.0000 mg | ORAL_TABLET | Freq: Every day | ORAL | Status: DC
  Administered 2020-10-18 – 2020-10-20 (×3): 40 mg
  Filled 2020-10-18 (×3): qty 1

## 2020-10-18 MED ORDER — CARVEDILOL 6.25 MG PO TABS
6.2500 mg | ORAL_TABLET | Freq: Two times a day (BID) | ORAL | Status: DC
  Administered 2020-10-18 – 2020-10-21 (×7): 6.25 mg
  Filled 2020-10-18: qty 2
  Filled 2020-10-18 (×5): qty 1

## 2020-10-18 MED ORDER — MORPHINE SULFATE (PF) 2 MG/ML IV SOLN
2.0000 mg | INTRAVENOUS | Status: DC | PRN
  Administered 2020-10-18: 4 mg via INTRAVENOUS
  Administered 2020-10-18: 2 mg via INTRAVENOUS
  Filled 2020-10-18: qty 1
  Filled 2020-10-18: qty 2

## 2020-10-18 MED ORDER — MAGNESIUM CITRATE PO SOLN
1.0000 | Freq: Every day | ORAL | Status: DC | PRN
  Administered 2020-10-18: 1 via ORAL
  Filled 2020-10-18: qty 296

## 2020-10-18 MED ORDER — SENNOSIDES-DOCUSATE SODIUM 8.6-50 MG PO TABS
1.0000 | ORAL_TABLET | Freq: Two times a day (BID) | ORAL | Status: DC | PRN
  Administered 2020-10-18: 1 via ORAL
  Filled 2020-10-18: qty 1

## 2020-10-18 MED ORDER — SODIUM CHLORIDE 0.9 % IV SOLN: 250.0000 mL | INTRAVENOUS | Status: DC | PRN

## 2020-10-18 MED ORDER — LANSOPRAZOLE 30 MG PO TBDD: 30.0000 mg | DELAYED_RELEASE_TABLET | Freq: Every day | ORAL | Status: DC

## 2020-10-18 MED ORDER — SODIUM CHLORIDE 0.9% FLUSH: 3.0000 mL | INTRAVENOUS | Status: DC | PRN

## 2020-10-18 MED ORDER — TAMSULOSIN HCL 0.4 MG PO CAPS
0.8000 mg | ORAL_CAPSULE | Freq: Every day | ORAL | Status: DC
  Filled 2020-10-18: qty 2

## 2020-10-18 MED ORDER — ONDANSETRON HCL 4 MG/2ML IJ SOLN: 4.0000 mg | Freq: Four times a day (QID) | INTRAMUSCULAR | Status: DC | PRN

## 2020-10-18 MED ORDER — FUROSEMIDE 10 MG/ML IJ SOLN
20.0000 mg | Freq: Every day | INTRAMUSCULAR | Status: DC
  Administered 2020-10-18 – 2020-10-20 (×3): 20 mg via INTRAVENOUS
  Filled 2020-10-18: qty 2
  Filled 2020-10-18: qty 4
  Filled 2020-10-18: qty 2

## 2020-10-18 MED ORDER — POLYETHYLENE GLYCOL 3350 17 G PO PACK
17.0000 g | PACK | Freq: Two times a day (BID) | ORAL | Status: DC | PRN
  Administered 2020-10-18: 17 g via ORAL
  Filled 2020-10-18: qty 1

## 2020-10-18 MED ORDER — ALBUTEROL SULFATE HFA 108 (90 BASE) MCG/ACT IN AERS
2.0000 | INHALATION_SPRAY | Freq: Four times a day (QID) | RESPIRATORY_TRACT | Status: DC | PRN
  Filled 2020-10-18: qty 6.7

## 2020-10-18 MED ORDER — POLYETHYLENE GLYCOL 3350 17 G PO PACK
17.0000 g | PACK | Freq: Every day | ORAL | Status: DC
  Administered 2020-10-18: 17 g
  Filled 2020-10-18: qty 1

## 2020-10-18 MED ORDER — NEPRO/CARBSTEADY PO LIQD
240.0000 mL | Freq: Four times a day (QID) | ORAL | Status: DC
  Administered 2020-10-18 (×3): 237 mL
  Administered 2020-10-18 (×2): 240 mL
  Administered 2020-10-19: 237 mL
  Filled 2020-10-18 (×6): qty 474

## 2020-10-18 MED ORDER — ACETAMINOPHEN 325 MG PO TABS: 650.0000 mg | ORAL_TABLET | ORAL | Status: DC | PRN

## 2020-10-18 MED ORDER — ENOXAPARIN SODIUM 30 MG/0.3ML ~~LOC~~ SOLN
30.0000 mg | SUBCUTANEOUS | Status: DC
  Administered 2020-10-18 – 2020-10-21 (×4): 30 mg via SUBCUTANEOUS
  Filled 2020-10-18 (×4): qty 0.3

## 2020-10-18 MED ORDER — DEXTROSE 5 % IV SOLN: INTRAVENOUS | Status: DC

## 2020-10-18 MED ORDER — PRO-STAT SUGAR FREE PO LIQD
30.0000 mL | Freq: Every day | ORAL | Status: DC
  Administered 2020-10-18 – 2020-10-19 (×2): 30 mL
  Filled 2020-10-18 (×2): qty 30

## 2020-10-18 NOTE — Progress Notes (Signed)
PROGRESS NOTE  [name] QBH:[id number] DOB: [birth year]   PCP: Axel Filler, MD  Patient is from: Home.  Uses walker.  Lives alone.  [facility]  DOA: 10/17/2020 LOS: 0  Chief complaints: Constipation and shortness of breath  Brief Narrative / Interim history: 87 year old male with history of glottic cancer in remission, G-tube dependence, combined CHF with AICD, DM-2, [name] 4 and debility presenting with constipation and shortness of breath, and admitted for acute respiratory failure with hypoxemia in the setting of acute on chronic combined CHF, and constipation.  Patient has been desaturating to upper 80s on 2 L by [place].  Also with tachypnea in the 20s to 30s.  CXR with cardiomegaly, pulmonary edema and possible atypical pneumonia.  KUB without bowel obstruction.  BNP 970.  He was a started on IV Lasix and bowel regimen.  TTE ordered.  Subjective: Seen and examined earlier this morning.  No major events overnight of this morning.  Wakes to voice easily but falls back asleep quickly.  Reports intermittent shortness of breath and constipation but not a great historian.  He denies chest pain, nausea, vomiting or abdominal pain.  Objective: Vitals:   10/18/20 0030 10/18/20 0408 10/18/20 0745 10/18/20 1100  BP:  115/73 119/70 118/67  Pulse: 92 81 93 87  Resp: (!) 21 (!) 27 18 16   Temp:   98.6 F (37 C)   TempSrc:   Oral   SpO2: 100% 99% 93% 95%  Weight:      Height:       No intake or output data in the 24 hours ending 10/18/20 1214 Filed Weights   10/17/20 1802  Weight: 65 kg    Examination:  GENERAL: No apparent distress.  Nontoxic. HEENT: MMM.  Vision and hearing grossly intact.  NECK: Supple.  No apparent JVD.  RESP: 93% on 3 L.  Notable dyspnea at times..  Diminished aeration. CVS:  RRR. Heart sounds normal.  ABD/GI/GU: BS+. Abd soft, NTND.  MSK/EXT:  Moves extremities.  Significant muscle mass and subcu fat loss. SKIN: no apparent skin  lesion or wound NEURO: Sleepy but wakes to voice.  Oriented x2.  No apparent focal neuro deficit. PSYCH: Calm. Normal affect.  Procedures:  None  Microbiology summarized: [id number] PCR pending.  Assessment & Plan: Acute hypoxemic respiratory failure likely due to acute on chronic CHF and possible atypical pneumonia.  Desaturating to mid and upper 80s on 2 L.  Also intermittent tachypnea in upper 20s to low 30s.  Notable dyspnea at times. -Treat treatable causes as below -Wean oxygen as able -OOB/PT/OT  Acute on chronic combined CHF: TTE in 02/2020 with LVEF of 25 to 30%, R WMA, G1 DD, large pericardial effusion. CXR with cardiomegaly and pulmonary edema.  BNP 970 (higher than baseline).  Still with dyspnea, hypoxemia and tachypnea.  Does not appear to be on Lasix at home. -Continue IV Lasix 20 mg daily -GDMT-continue home Coreg.  Could benefit from BiDil.  Not a candidate for ARNi/ACE inhibitor's/ARB -Monitor fluid status, renal functions and electrolytes. -Follow TTE.  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 He has G-tube but I don't see order for tube feed. -Medication per G-tube -Consult SLP and dietitian  [name]-4 with azotemia: Reviewed his prior results and seems to be stable Recent Labs    09/24/20 0330 09/25/20 0230 09/26/20 0329 09/27/20 0421 09/28/20 0504 10/09/20 2009 10/10/20 0340 10/11/20 0313 10/17/20 1808 10/18/20 1009  BUN 94* 91* 91* 89* 93*  152* 148* 133* 111* 114*  CREATININE 1.97* 2.05* 2.03* 2.05* 2.01* 2.30* 2.31* 2.24* 2.12* 2.27*  -Need nephrology follow-up if he has not had one yet  IDDM-2 with hyperglycemia complicated by [name]-4: On Lantus 8 units at home. Recent Labs  Lab 10/17/20 1808 10/18/20 0026 10/18/20 0335 10/18/20 0748  GLUCAP 276* 298* 211* 172*  -Closely monitor CBG while on D5 for hypernatremia and adjust SSI as appropriate -Continue statin. -Check hemoglobin A1c  Hypernatremia: Due to Lasix? -D5 at 75 cc an  hour  Constipation: Was not compliant with bowel regimen. -MiraLAX, Senokot-S and mag citrate -Fleet enema if no improvement  Debility/physical deconditioning: Reports using walker at baseline.  [facility]. -PT/OT eval  Leukocytosis: Unclear source.  Questionable atypical pneumonia on chest x-ray. -Check differential -Check urinalysis -Check procalcitonin  Goal of care-patient with significant comorbidities as above. Still full code which I believe would pose more harm than benefit.  I discussed this with patient son, Jule who defers the decision to his father. Patient was somewhat sleepy and only oriented only to self and place.  -We will discuss this with patient when he is fully awake -Palliative care consult  Severe malnutrition as evidenced by low BMI and significant muscle mass and subcu fat loss Body mass index is 19.99 kg/m.       Pressure Ulcer 11/17/15 Stage II -  Partial thickness loss of dermis presenting as a shallow open ulcer with a red, pink wound bed without slough. Covered with Foam Mepilex (Active)  11/17/15 1110  Location: Sacrum  Location Orientation:   Staging: Stage II -  Partial thickness loss of dermis presenting as a shallow open ulcer with a red, pink wound bed without slough.  Wound Description (Comments): Covered with Foam Mepilex  Present on Admission: Yes     Pressure Injury 04/22/17 Stage II -  Partial thickness loss of dermis presenting as a shallow open ulcer with a red, pink wound bed without slough. this is a partial thickness fissure in the gluteal cleft related to moisture (Active)  04/22/17 0302  Location: Sacrum  Location Orientation: Mid  Staging: Stage II -  Partial thickness loss of dermis presenting as a shallow open ulcer with a red, pink wound bed without slough.  Wound Description (Comments): this is a partial thickness fissure in the gluteal cleft related to moisture  Present on Admission: Yes   DVT prophylaxis:   enoxaparin (LOVENOX) injection 30 mg Start: 10/18/20 1000  Code Status: Full code Family Communication: Updated patient's son over the phone. Level of care: Telemetry Status is: Observation  The patient will require care spanning > 2 midnights and should be moved to inpatient because: Persistent severe electrolyte disturbances, Altered mental status, Ongoing diagnostic testing needed not appropriate for outpatient work up, Unsafe d/c plan, IV treatments appropriate due to intensity of illness or inability to take PO and Inpatient level of care appropriate due to severity of illness  Dispo: The patient is from: Home              Anticipated d/c is to: To be determined.              Anticipated d/c date is: 2 days              Patient currently is not medically stable to d/c.   Difficult to place patient No       Consultants:  Palliative medicine   Sch Meds:  Scheduled Meds: . atorvastatin  40 mg Per Tube q1800  . carvedilol  6.25 mg Per Tube BID WC  . enoxaparin (LOVENOX) injection  30 mg Subcutaneous Q24H  . feeding supplement (NEPRO CARB STEADY)  240 mL Per Tube QID  . feeding supplement (PRO-STAT SUGAR FREE 64)  30 mL Per Tube Daily  . insulin aspart  0-6 Units Subcutaneous Q4H  . insulin glargine  8 Units Subcutaneous Daily  . pantoprazole sodium  40 mg Per Tube Daily  . sodium chloride flush  3 mL Intravenous Q12H  . trimethoprim-polymyxin b  1 drop Both Eyes QID   Continuous Infusions: . sodium chloride     PRN Meds:.sodium chloride, acetaminophen, albuterol, magnesium citrate, morphine injection, ondansetron (ZOFRAN) IV, polyethylene glycol, polyvinyl alcohol, prochlorperazine, senna-docusate, sodium chloride flush  Antimicrobials: Anti-infectives (From admission, onward)   None       I have personally reviewed the following labs and images: CBC: Recent Labs  Lab 10/17/20 1808 10/18/20 0808  WBC 10.1 15.7*  NEUTROABS 6.8  --   HGB 12.6* 13.0  HCT 40.6  41.3  MCV 98.3 98.6  PLT 225 198   BMP &GFR Recent Labs  Lab 10/17/20 1808 10/18/20 1009  NA 143 148*  K 4.9 4.5  CL 104 109  CO2 28 24  GLUCOSE 369* 177*  BUN 111* 114*  CREATININE 2.12* 2.27*  CALCIUM 10.1 10.1  MG  --  2.3  PHOS  --  3.2   Estimated Creatinine Clearance: 21.1 mL/min (A) (by C-G formula based on SCr of 2.27 mg/dL (H)). Liver & Pancreas: Recent Labs  Lab 10/18/20 1009  ALBUMIN 3.5   No results for input(s): LIPASE, AMYLASE in the last 168 hours. No results for input(s): AMMONIA in the last 168 hours. Diabetic: No results for input(s): HGBA1C in the last 72 hours. Recent Labs  Lab 10/17/20 1808 10/18/20 0026 10/18/20 0335 10/18/20 0748  GLUCAP 276* 298* 211* 172*   Cardiac Enzymes: No results for input(s): CKTOTAL, CKMB, CKMBINDEX, TROPONINI in the last 168 hours. No results for input(s): PROBNP in the last 8760 hours. Coagulation Profile: No results for input(s): INR, PROTIME in the last 168 hours. Thyroid Function Tests: No results for input(s): TSH, T4TOTAL, FREET4, T3FREE, THYROIDAB in the last 72 hours. Lipid Profile: No results for input(s): CHOL, HDL, LDLCALC, TRIG, CHOLHDL, LDLDIRECT in the last 72 hours. Anemia Panel: No results for input(s): VITAMINB12, FOLATE, FERRITIN, TIBC, IRON, RETICCTPCT in the last 72 hours. Urine analysis:    Component Value Date/Time   COLORURINE STRAW (A) 10/10/2020 0541   APPEARANCEUR CLEAR 10/10/2020 0541   LABSPEC 1.009 10/10/2020 0541   PHURINE 8.0 10/10/2020 0541   GLUCOSEU NEGATIVE 10/10/2020 0541   GLUCOSEU 500 (A) 05/12/2009 2130   HGBUR NEGATIVE 10/10/2020 0541   BILIRUBINUR NEGATIVE 10/10/2020 0541   KETONESUR NEGATIVE 10/10/2020 0541   PROTEINUR NEGATIVE 10/10/2020 0541   UROBILINOGEN 1.0 01/16/2015 1630   NITRITE NEGATIVE 10/10/2020 0541   LEUKOCYTESUR NEGATIVE 10/10/2020 0541   Sepsis Labs: Invalid input(s): PROCALCITONIN, [place]  Microbiology: Recent Results (from the past  240 hour(s))  Resp Panel by RT-PCR (Flu A&B, Covid) Nasopharyngeal Swab     Status: None   Collection Time: 10/09/20  7:50 PM   Specimen: Nasopharyngeal Swab; Nasopharyngeal(NP) swabs in vial transport medium  Result Value Ref Range Status   SARS Coronavirus 2 by RT PCR NEGATIVE NEGATIVE Final    Comment: (NOTE) SARS-CoV-2 target nucleic acids are NOT DETECTED.  The SARS-CoV-2 RNA is generally detectable in upper  respiratory specimens during the acute phase of infection. The lowest concentration of SARS-CoV-2 viral copies this assay can detect is 138 copies/mL. A negative result does not preclude SARS-Cov-2 infection and should not be used as the sole basis for treatment or other patient management decisions. A negative result may occur with  improper specimen collection/handling, submission of specimen other than nasopharyngeal swab, presence of viral mutation(s) within the areas targeted by this assay, and inadequate number of viral copies(<138 copies/mL). A negative result must be combined with clinical observations, patient history, and epidemiological information. The expected result is Negative.  Fact Sheet for Patients:  EntrepreneurPulse.com.au  Fact Sheet for Healthcare Providers:  IncredibleEmployment.be  This test is no t yet approved or cleared by the [place] FDA and  has been authorized for detection and/or diagnosis of SARS-CoV-2 by FDA under an Emergency Use Authorization (EUA). This EUA will remain  in effect (meaning this test can be used) for the duration of the COVID-19 declaration under Section 564(b)(1) of the Act, 21 U.S.C.section 360bbb-3(b)(1), unless the authorization is terminated  or revoked sooner.       Influenza A by PCR NEGATIVE NEGATIVE Final   Influenza B by PCR NEGATIVE NEGATIVE Final    Comment: (NOTE) The Xpert Xpress SARS-CoV-2/FLU/RSV plus assay is intended as an aid in the diagnosis of influenza  from Nasopharyngeal swab specimens and should not be used as a sole basis for treatment. Nasal washings and aspirates are unacceptable for Xpert Xpress SARS-CoV-2/FLU/RSV testing.  Fact Sheet for Patients: EntrepreneurPulse.com.au  Fact Sheet for Healthcare Providers: IncredibleEmployment.be  This test is not yet approved or cleared by the [place] FDA and has been authorized for detection and/or diagnosis of SARS-CoV-2 by FDA under an Emergency Use Authorization (EUA). This EUA will remain in effect (meaning this test can be used) for the duration of the COVID-19 declaration under Section 564(b)(1) of the Act, 21 U.S.C. section 360bbb-3(b)(1), unless the authorization is terminated or revoked.  Performed at [facility], [place] [address], [place], [place] [address]   Urine culture     Status: None   Collection Time: 10/10/20  3:43 AM   Specimen: Urine, Random  Result Value Ref Range Status   Specimen Description URINE, RANDOM  Final   Special Requests NONE  Final   Culture   Final    NO GROWTH Performed at [facility], [place] [address], [place], [place] [address]    Report Status 10/11/2020 FINAL  Final    Radiology Studies: DG ABD ACUTE 2+V W 1V CHEST  Result Date: 10/17/2020 CLINICAL DATA:  87 year old male with constipation. EXAM: DG ABDOMEN ACUTE WITH 1 VIEW CHEST COMPARISON:  Abdominal radiograph dated 10/10/2020. chest radiograph dated 10/09/2020. FINDINGS: There is cardiomegaly. Faint diffuse interstitial prominence may represent mild edema or atypical pneumonia. No pleural effusion pneumothorax. Coronary vascular stent. Atherosclerotic calcification of the aorta. Left pectoral AICD device. No bowel dilatation or evidence of obstruction. Air is noted throughout the colon. Probable colonic diverticula with diverticulith. Dense stool noted in the rectal vault. A catheter noted with tip over the lower lumbar spine.  Degenerative changes of the spine.  No acute osseous pathology. IMPRESSION: 1. Cardiomegaly with mild edema or atypical pneumonia. 2. No bowel obstruction. Electronically Signed   By: Anner Crete M.D.   On: 10/17/2020 18:43    Georgenia Salim T. [place]  If 7PM-7AM, please contact night-coverage [email or web address] 10/18/2020, 12:14 PM

## 2020-10-18 NOTE — H&P (Signed)
History and Physical    [name] AGT:[id number] DOB: 1931-09-11 DOA: 10/17/2020  PCP: Tyson Alias, MD  Patient coming from: Home  I have personally briefly reviewed patient's old medical records in [facility]  Chief Complaint: Constipation, SOB  HPI: [name] is a 87 y.o. male with medical history significant of glottis neoplasm in remission with all PO intake via G tube, HFrEF (EF 25-30%, grade 1 DD as of June 21), ICM, AICD placement, HTN, DM2, [name].  Pt presents to the ED with c/o constipation for the past 3 days.  Admits he is supposed to be taking laxatives but has been noncompliant with this.  Also admits to not taking insulin for the past 2 days.  Symptoms are constant, worsening.  Denies any abdominal pain.  No vomiting, no fevers nor chills.   ED Course: In the ED, pt initially given a 500cc IVF bolus.  Mineral oil for constipation.  CXR however has come back showing CHF / pulm edema findings.  BNP seems to confirm this as it is 970.  Pt given 40mg  IV lasix by EDP and admission requested.  BUN today is 111 and creat 2.1,  This is slightly improved from 133 and 2.2 at discharge on 1/17.  He had BUN of 152 and creat 2.3 on a recent admission on 1/15 for G tube dysfunction.  He was felt to be fluid overloaded at that time as well, given lasix for diuresis and FWF stopped, looks like he was net negative just under 1L the first day.   Review of Systems: As per HPI, otherwise all review of systems negative.  Past Medical History:  Diagnosis Date  . Adenomatous colon polyp 02/14/2012  . AICD (automatic cardioverter/defibrillator) present    Medtronic   . BBB (bundle branch block)    right  . Carotid stenosis    a. s/p L carotid stent 2004;  b. Carotid 2005 (09/2014): Bilateral ICA 1-39%, left ECA >59%, normal subclavian bilaterally, occluded left vertebral >> FU 2 years  . Chronic kidney disease   . Chronic systolic CHF (congestive heart failure)  (HCC)    a. ischemic CM EF 15-20%;  b. s/p AICD 05/24/04;  c. Echo 7/06: EF 30-40%, mild reduced RVSF, d. Echo 12/2015 EF 35-40%  . Elevated PSA   . History of kidney stones    x 1  . HTN (hypertension)   . Hyperlipidemia   . Hyponatremia 09/22/2020  . ICD (implantable cardiac defibrillator) in place 12-25-2012   MDT CRTD upgrade by Dr 02-24-2013  . Myocardial infarction (HCC) 1990  . Pericardial effusion    Echocardiogram (09/2014): EF 25% with distal anterior, distal inferior, distal lateral and apical akinesis, grade 1 diastolic dysfunction, very mild aortic stenosis (mean 7 mmHg) - this may be depressed due to low EF (2-D images suggest mild to moderate aortic stenosis), large pericardial effusion, no RA collapse  . Pneumonia   . Presence of permanent cardiac pacemaker    Medtronic  . PVD (peripheral vascular disease) (HCC)    s/p L carotid PTCA/stent 2004  . Transient ischemic attack   . Type II diabetes mellitus (HCC)    type II    Past Surgical History:  Procedure Laterality Date  . BI-VENTRICULAR IMPLANTABLE CARDIOVERTER DEFIBRILLATOR UPGRADE N/A 12/25/2012   Procedure: BI-VENTRICULAR IMPLANTABLE CARDIOVERTER DEFIBRILLATOR UPGRADE;  Surgeon: 02/24/2013, MD;  Location: [facility] CATH LAB;  Service: Cardiovascular;  Laterality: N/A;  . BIV ICD UPGRADE  12/25/2012  MDT CRTD upgrade by Dr Ladona Ridgel for ischemic cardiomyopathy and worsening conduction system disease  . CARDIAC CATHETERIZATION  06/2003,  01/2004  . CARDIAC CATHETERIZATION N/A 01/13/2016   Procedure: Left Heart Cath and Coronary Angiography;  Surgeon: Corky Crafts, MD;  Location: [facility] INVASIVE CV LAB;  Service: Cardiovascular;  Laterality: N/A;  . CARDIAC DEFIBRILLATOR PLACEMENT  05/24/2004   Implantation of a MDT single-chamber defibrillator  . CAROTID STENT  09/11/2003   Percutaneous transluminal angioplasty and stent placement of the left internal carotid artery.  Marland Kitchen CATARACT EXTRACTION W/ INTRAOCULAR LENS  IMPLANT, BILATERAL  Bilateral ~ 2010  . CORONARY ANGIOPLASTY WITH STENT PLACEMENT  1990   "2" (12/25/2012)  . CYSTOSCOPY    . DIRECT LARYNGOSCOPY N/A 08/15/2019   Procedure: MICRODIRECT LARYNGOSCOPY WITH BIOPSY;  Surgeon: Christia Reading, MD;  Location: [facility] OR;  Service: ENT;  Laterality: N/A;  . ESOPHAGOGASTRODUODENOSCOPY (EGD) WITH PROPOFOL N/A 11/22/2019   Procedure: ESOPHAGOGASTRODUODENOSCOPY (EGD) WITH PROPOFOL with possible dilation;  Surgeon: Vida Rigger, MD;  Location: [facility] ENDOSCOPY;  Service: Endoscopy;  Laterality: N/A;  . INSERT / REPLACE / REMOVE PACEMAKER    . IR GASTROSTOMY TUBE MOD SED  11/27/2019  . IR REPLACE G-TUBE SIMPLE WO FLUORO  10/10/2020  . LEAD REVISION N/A 12/25/2012   Procedure: LEAD REVISION;  Surgeon: Marinus Maw, MD;  Location: [facility] CATH LAB;  Service: Cardiovascular;  Laterality: N/A;  . RIGID ESOPHAGOSCOPY N/A 08/15/2019   Procedure: RIGID ESOPHAGOSCOPY;  Surgeon: Christia Reading, MD;  Location: [facility] OR;  Service: ENT;  Laterality: N/A;     reports that he quit smoking about 52 years ago. His smoking use included cigarettes. He has never used smokeless tobacco. He reports that he does not drink alcohol and does not use drugs.  No Known Allergies  Family History  Problem Relation Age of Onset  . Diabetes Mother   . Healthy Father   . Diabetes Brother   . Heart attack Neg Hx   . Stroke Neg Hx      Prior to Admission medications   Medication Sig Start Date End Date Taking? Authorizing Provider  Amino Acids-Protein Hydrolys (FEEDING SUPPLEMENT, PRO-STAT SUGAR FREE 64,) LIQD Place 30 mLs into feeding tube daily. 11/30/19   Seawell, Jaimie A, DO  aspirin EC 81 MG tablet 81 mg daily. Per tube Patient not taking: Reported on 10/17/2020    [provider]  atorvastatin (LIPITOR) 40 MG tablet TAKE 1 TABLET(40 MG) BY TUBE DAILY AT 6 PM 10/11/20   Katsadouros, Vasilios, MD  Blood Glucose Monitoring Suppl (ACCU-CHEK AVIVA PLUS) w/Device KIT Check finger stick glucose once daily 05/06/18    Tyson Alias, MD  carvedilol (COREG) 6.25 MG tablet PLACE 1 TABLET INTO FEEDING TUBE TWICE DAILY WITH A MEAL Patient taking differently: Place 6.25 mg into feeding tube 2 (two) times daily with a meal. 09/28/20   Katsadouros, Vasilios, MD  glucose blood (ACCU-CHEK AVIVA PLUS) test strip Check blood sugar up to 3 times a day 09/15/19   Tyson Alias, MD  insulin glargine (LANTUS) 100 UNIT/ML injection Inject 0.08 mLs (8 Units total) into the skin daily. 12/04/19   Katherine Roan, MD  Insulin Pen Needle (B-D ULTRAFINE III SHORT PEN) 31G X 8 MM MISC USE ONCE DAILY WITH LANTUS PEN 05/06/18   Tyson Alias, MD  lansoprazole (PREVACID SOLUTAB) 30 MG disintegrating tablet Place 30 mg into feeding tube daily.    [provider]  liver oil-zinc oxide (DESITIN) 40 % ointment Apply  1 application topically See admin instructions. Apply twice daily and as needed for irritation    [provider]  Nutritional Supplements (FEEDING SUPPLEMENT, NEPRO CARB STEADY,) LIQD Place 237 mLs into feeding tube 4 (four) times daily. Patient taking differently: Place 240 mLs into feeding tube 4 (four) times daily. 12/08/19   Christian, Rylee, MD  polyethylene glycol (MIRALAX / GLYCOLAX) 17 g packet Place 17 g into feeding tube daily. 10/11/20   Katsadouros, Vasilios, MD  polyvinyl alcohol (LIQUIFILM TEARS) 1.4 % ophthalmic solution Place 1 drop into both eyes 4 (four) times daily as needed for dry eyes.    [provider]  prochlorperazine (COMPAZINE) 5 MG tablet Place 5 mg into feeding tube 3 (three) times daily as needed for nausea.    [provider]  tamsulosin (FLOMAX) 0.4 MG CAPS capsule Take 2 capsules (0.8 mg total) by mouth at bedtime. Per tube 10/11/20   Katsadouros, Vasilios, MD  triamcinolone (KENALOG) 0.025 % cream Apply 1 application topically 2 (two) times daily as needed (venous stasis dermatitis).    [provider]  trimethoprim-polymyxin b  (POLYTRIM) ophthalmic solution Place 1 drop into both eyes 4 (four) times daily. 09/10/20   [provider]  VENTOLIN HFA 108 (90 Base) MCG/ACT inhaler INHALE 2 PUFFS INTO THE LUNGS EVERY 6 HOURS AS NEEDED FOR WHEEZING OR SHORTNESS OF BREATH Patient taking differently: Inhale 2 puffs into the lungs every 6 (six) hours as needed for wheezing or shortness of breath. 03/15/16   Axel Filler, MD    Physical Exam: Vitals:   10/17/20 2345 10/18/20 0000 10/18/20 0015 10/18/20 0030  BP:      Pulse: 92 95 91 92  Resp: 12 (!) 24 (!) 23 (!) 21  Temp:      SpO2: 100% 100% 100% 100%  Weight:      Height:        Constitutional: NAD, calm, comfortable Eyes: PERRL, lids and conjunctivae normal ENMT: Mucous membranes are moist. Posterior pharynx clear of any exudate or lesions.Normal dentition.  Neck: normal, supple, no masses, no thyromegaly Respiratory: B rhonchi, no respiratory distress nor accessory muscle use. Cardiovascular: Regular rate and rhythm, no murmurs / rubs / gallops. No extremity edema. 2+ pedal pulses. No carotid bruits.  Abdomen: no tenderness, no masses palpated. No hepatosplenomegaly. Bowel sounds positive.  Musculoskeletal: no clubbing / cyanosis. No joint deformity upper and lower extremities. Good ROM, no contractures. Normal muscle tone.  Skin: no rashes, lesions, ulcers. No induration Neurologic: CN 2-12 grossly intact. Sensation intact, DTR normal. Strength 5/5 in all 4.  Psychiatric: Normal judgment and insight. Alert and oriented x 3. Normal mood.    Labs on Admission: I have personally reviewed following labs and imaging studies  CBC: Recent Labs  Lab 10/17/20 1808  WBC 10.1  NEUTROABS 6.8  HGB 12.6*  HCT 40.6  MCV 98.3  PLT 027   Basic Metabolic Panel: Recent Labs  Lab 10/11/20 0313 10/17/20 1808  NA 137 143  K 4.5 4.9  CL 97* 104  CO2 28 28  GLUCOSE 265* 369*  BUN 133* 111*  CREATININE 2.24* 2.12*  CALCIUM 9.6 10.1    GFR: Estimated Creatinine Clearance: 22.6 mL/min (A) (by C-G formula based on SCr of 2.12 mg/dL (H)). Liver Function Tests: No results for input(s): AST, ALT, ALKPHOS, BILITOT, PROT, ALBUMIN in the last 168 hours. No results for input(s): LIPASE, AMYLASE in the last 168 hours. No results for input(s): AMMONIA in the last 168 hours.  Coagulation Profile: No results for input(s): INR, PROTIME in the last 168 hours. Cardiac Enzymes: No results for input(s): CKTOTAL, CKMB, CKMBINDEX, TROPONINI in the last 168 hours. BNP (last 3 results) No results for input(s): PROBNP in the last 8760 hours. HbA1C: No results for input(s): HGBA1C in the last 72 hours. CBG: Recent Labs  Lab 10/11/20 0611 10/17/20 1808 10/18/20 0026  GLUCAP 290* 276* 298*   Lipid Profile: No results for input(s): CHOL, HDL, LDLCALC, TRIG, CHOLHDL, LDLDIRECT in the last 72 hours. Thyroid Function Tests: No results for input(s): TSH, T4TOTAL, FREET4, T3FREE, THYROIDAB in the last 72 hours. Anemia Panel: No results for input(s): VITAMINB12, FOLATE, FERRITIN, TIBC, IRON, RETICCTPCT in the last 72 hours. Urine analysis:    Component Value Date/Time   COLORURINE STRAW (A) 10/10/2020 0541   APPEARANCEUR CLEAR 10/10/2020 0541   LABSPEC 1.009 10/10/2020 0541   PHURINE 8.0 10/10/2020 0541   GLUCOSEU NEGATIVE 10/10/2020 0541   GLUCOSEU 500 (A) 05/12/2009 2130   HGBUR NEGATIVE 10/10/2020 0541   BILIRUBINUR NEGATIVE 10/10/2020 0541   KETONESUR NEGATIVE 10/10/2020 0541   PROTEINUR NEGATIVE 10/10/2020 0541   UROBILINOGEN 1.0 01/16/2015 1630   NITRITE NEGATIVE 10/10/2020 0541   LEUKOCYTESUR NEGATIVE 10/10/2020 0541    Radiological Exams on Admission: DG ABD ACUTE 2+V W 1V CHEST  Result Date: 10/17/2020 CLINICAL DATA:  87 year old male with constipation. EXAM: DG ABDOMEN ACUTE WITH 1 VIEW CHEST COMPARISON:  Abdominal radiograph dated 10/10/2020. chest radiograph dated 10/09/2020. FINDINGS: There is cardiomegaly. Faint  diffuse interstitial prominence may represent mild edema or atypical pneumonia. No pleural effusion pneumothorax. Coronary vascular stent. Atherosclerotic calcification of the aorta. Left pectoral AICD device. No bowel dilatation or evidence of obstruction. Air is noted throughout the colon. Probable colonic diverticula with diverticulith. Dense stool noted in the rectal vault. A catheter noted with tip over the lower lumbar spine. Degenerative changes of the spine.  No acute osseous pathology. IMPRESSION: 1. Cardiomegaly with mild edema or atypical pneumonia. 2. No bowel obstruction. Electronically Signed   By: Anner Crete M.D.   On: 10/17/2020 18:43    EKG: Independently reviewed.  Assessment/Plan Principal Problem:   Acute on chronic combined systolic and diastolic CHF (congestive heart failure) (HCC) Active Problems:   Type 2 diabetes mellitus with peripheral vascular disease (HCC)   Malignant neoplasm of glottis (HCC)   [name] (chronic kidney disease) stage 4, GFR 15-29 ml/min (HCC)    1. Acute on chronic combined CHF - 1. CHF pathway 2. Got $Remo'40mg'obQfW$  IV lasix x1 in ED 3. Will wait to see how he responds (both UOP wise and renal function wise) before ordering further lasix. 4. Tele monitor 5. Will update 2d echo 6. Strict intake and output 2. [name] 4 - 1. ? Cardiorenal syndrome? Renal function seemed to get better during admission last week with diuresis which would support this. 2. Got $Remo'40mg'pJovn$  IV lasix x1 dose, will hold off on further lasix orders until we see how he responds. 3. Repeat BMP in AM 4. Strict intake and output 5. Probably would be a good idea to get a nephrology consult in AM. 6. Chart lists him as having [name]3; however, BUN running 100+ for over a month now, eGFR (and [name] stage) is dependent on which equation used. 3. Malignant neoplasm of glottis - 1. In remission 2. Cont tube feeds 3. Currently off of FWF following admit last week. 4. DM2 - 1. Cont lantus 2. Very  sensitive SSI Q4H  DVT prophylaxis: Lovenox Code Status:  Full Family Communication: No family in room Disposition Plan: Home after diuresis and improvement in renal function hopefully Consults called: None Admission status: Place in obs    Cartina Brousseau, [place] [facility]  How to contact the [facility] Attending or Consulting provider [place] or covering provider during after hours [place], for this patient?  1. Check the care team in [facility] and look for a) attending/consulting [facility] provider listed and b) the [facility] team listed 2. Log into [email or web address]  Amion Physician Scheduling and messaging for groups and whole hospitals  On call and physician scheduling software for group practices, residents, hospitalists and other medical providers for call, clinic, rotation and shift schedules. OnCall [employer] is a hospital-wide system for scheduling doctors and paging doctors on call. EasyPlot is for scientific plotting and data analysis.  [email or web address]  and use [place]'s universal password to access. If you do not have the password, please contact the hospital operator.  3. Locate the [facility] provider you are looking for under [facility] and page to a number that you can be directly reached. 4. If you still have difficulty reaching the provider, please page the [facility] (Director on Call) for the Hospitalists listed on amion for assistance.  10/18/2020, 12:50 AM

## 2020-10-18 NOTE — Evaluation (Signed)
Clinical/Bedside Swallow Evaluation Patient Details  Name: [name] MRN: [id number] Date of Birth: [birth year]  Today's Date: 10/18/2020 Time: SLP Start Time (ACUTE ONLY): 1555 SLP Stop Time (ACUTE ONLY): 1615 SLP Time Calculation (min) (ACUTE ONLY): 20 min  Past Medical History:  Past Medical History:  Diagnosis Date  . Adenomatous colon polyp 02/14/2012  . AICD (automatic cardioverter/defibrillator) present    Medtronic   . BBB (bundle branch block)    right  . Carotid stenosis    a. s/p L carotid stent 2004;  b. Carotid US (09/2014): Bilateral ICA 1-39%, left ECA >59%, normal subclavian bilaterally, occluded left vertebral >> FU 2 years  . Chronic kidney disease   . Chronic systolic CHF (congestive heart failure) (HCC)    a. ischemic CM EF 15-20%;  b. s/p AICD 05/24/04;  c. Echo 7/06: EF 30-40%, mild reduced RVSF, d. Echo 12/2015 EF 35-40%  . Elevated PSA   . History of kidney stones    x 1  . HTN (hypertension)   . Hyperlipidemia   . Hyponatremia 09/22/2020  . ICD (implantable cardiac defibrillator) in place 12-25-2012   MDT CRTD upgrade by Dr Lovena Le  . Myocardial infarction ([place]) 1990  . Pericardial effusion    Echocardiogram (09/2014): EF 25% with distal anterior, distal inferior, distal lateral and apical akinesis, grade 1 diastolic dysfunction, very mild aortic stenosis (mean 7 mmHg) - this may be depressed due to low EF (2-D images suggest mild to moderate aortic stenosis), large pericardial effusion, no RA collapse  . Pneumonia   . Presence of permanent cardiac pacemaker    Medtronic  . PVD (peripheral vascular disease) ([place])    s/p L carotid PTCA/stent 2004  . Transient ischemic attack   . Type II diabetes mellitus ([place])    type II   Past Surgical History:  Past Surgical History:  Procedure Laterality Date  . BI-VENTRICULAR IMPLANTABLE CARDIOVERTER DEFIBRILLATOR UPGRADE N/A 12/25/2012   Procedure: BI-VENTRICULAR IMPLANTABLE CARDIOVERTER DEFIBRILLATOR UPGRADE;   Surgeon: Evans Lance, MD;  Location: [facility] CATH LAB;  Service: Cardiovascular;  Laterality: N/A;  . BIV ICD UPGRADE  12/25/2012   MDT CRTD upgrade by Dr Lovena Le for ischemic cardiomyopathy and worsening conduction system disease  . CARDIAC CATHETERIZATION  06/2003,  01/2004  . CARDIAC CATHETERIZATION N/A 01/13/2016   Procedure: Left Heart Cath and Coronary Angiography;  Surgeon: Jettie Booze, MD;  Location: [facility] CV LAB;  Service: Cardiovascular;  Laterality: N/A;  . CARDIAC DEFIBRILLATOR PLACEMENT  05/24/2004   Implantation of a MDT single-chamber defibrillator  . CAROTID STENT  09/11/2003   Percutaneous transluminal angioplasty and stent placement of the left internal carotid artery.  Marland Kitchen CATARACT EXTRACTION W/ INTRAOCULAR LENS  IMPLANT, BILATERAL Bilateral ~ 2010  . CORONARY ANGIOPLASTY WITH STENT PLACEMENT  1990   "2" (12/25/2012)  . CYSTOSCOPY    . DIRECT LARYNGOSCOPY N/A 08/15/2019   Procedure: MICRODIRECT LARYNGOSCOPY WITH BIOPSY;  Surgeon: Melida Quitter, MD;  Location: [place];  Service: ENT;  Laterality: N/A;  . ESOPHAGOGASTRODUODENOSCOPY (EGD) WITH PROPOFOL N/A 11/22/2019   Procedure: ESOPHAGOGASTRODUODENOSCOPY (EGD) WITH PROPOFOL with possible dilation;  Surgeon: Clarene Essex, MD;  Location: [place];  Service: Endoscopy;  Laterality: N/A;  . INSERT / REPLACE / REMOVE PACEMAKER    . IR GASTROSTOMY TUBE MOD SED  11/27/2019  . IR REPLACE G-TUBE SIMPLE WO FLUORO  10/10/2020  . LEAD REVISION N/A 12/25/2012   Procedure: LEAD REVISION;  Surgeon: Evans Lance, MD;  Location: [facility] CATH LAB;  Service: Cardiovascular;  Laterality: N/A;  . RIGID ESOPHAGOSCOPY N/A 08/15/2019   Procedure: RIGID ESOPHAGOSCOPY;  Surgeon: Melida Quitter, MD;  Location: [place];  Service: ENT;  Laterality: N/A;   HPI:  Patient is an 87 y.o. male with PMH: chronic systolic and diastolic heart failure 2/2 ischemic cardiomyopathy s/p ICD placement (2005), hypertension, CKDIII, PAD s/p L carotid stent (2004), hx of  glottic cancer (s/p 20 doses XRT to R larynx/glottis - last treatment on 11/19/19). He has had multiple MBS's last year (3/1, 3/15, 5/26, 10/28 and most recently 12/31 with all studies showing mod-severe vallecular residue due to decreased base of tongue propulsion, decreased pharyngeal constriction and reduced epiglottic deflection leading to some degree of aspiration present during all MBS studies. Patient had G-tube placed 11/27/19. Per MBS on 09/24/20, recommendation was for primary nutrition through PEG but allow for small amounts of puree solids and thin liquids mainly for pleasure.   Assessment / Plan / Recommendation Clinical Impression  Patient presents with a severe oropharyngeal dysphagia with increased RR during PO intake of small amount of ice chips, decreased SpO2 saturations at baseline (mid-high 80%'s), mildly congested vocal quality and throat clearing after PO's ice chips. In addition, patient with a h/o mod-severe dysphagia secondary to radiation treatment and unfortunately has not shown improvements as per multiple MBS studies last year. (most recent 12/31). Patient has had a G-tube since March of 2021. Do not anticipate benefit from MBS study and unfortunately, do not anticipate significant improvement in swallow function during current acute care stay. SLP will follow up with patient but anticipate this will be more for determination of comfort PO's only. SLP Visit Diagnosis: Dysphagia, unspecified (R13.10)    Aspiration Risk  Severe aspiration risk    Diet Recommendation Ice chips PRN after oral care;NPO   Medication Administration: Via alternative means    Other  Recommendations Oral Care Recommendations: Oral care QID;Staff/trained caregiver to provide oral care   Follow up Recommendations Other (comment) (TBD)      Frequency and Duration min 1 x/week  1 week       Prognosis Prognosis for Safe Diet Advancement: Guarded Barriers to Reach Goals: Severity of deficits;Time  post onset Barriers/Prognosis Comment: patient has had severe dysphagia with little to no change in MBS studies from march of 2021 to December of 2021      Swallow Study   General Date of Onset: 10/18/20 HPI: Patient is an 87 y.o. male with PMH: chronic systolic and diastolic heart failure 2/2 ischemic cardiomyopathy s/p ICD placement (2005), hypertension, CKDIII, PAD s/p L carotid stent (2004), hx of glottic cancer (s/p 20 doses XRT to R larynx/glottis - last treatment on 11/19/19). He has had multiple MBS's last year (3/1, 3/15, 5/26, 10/28 and most recently 12/31 with all studies showing mod-severe vallecular residue due to decreased base of tongue propulsion, decreased pharyngeal constriction and reduced epiglottic deflection leading to some degree of aspiration present during all MBS studies. Patient had G-tube placed 11/27/19. Per MBS on 09/24/20, recommendation was for primary nutrition through PEG but allow for small amounts of puree solids and thin liquids mainly for pleasure. Type of Study: Bedside Swallow Evaluation Previous Swallow Assessment: see HPI, most recent on 09/24/20 Diet Prior to this Study: Regular;Thin liquids Temperature Spikes Noted: No Respiratory Status: Nasal cannula History of Recent Intubation: No Behavior/Cognition: Alert;Confused;Lethargic/Drowsy;Requires cueing Oral Cavity Assessment: Other (comment) (oral thrush likely on tongue) Oral Care Completed by SLP: Yes Oral Cavity - Dentition: Edentulous Self-Feeding Abilities: Total assist  Patient Positioning: Upright in bed Baseline Vocal Quality: Other (comment) (mildly congested) Volitional Cough: Cognitively unable to elicit Volitional Swallow: Unable to elicit    Oral/Motor/Sensory Function Overall Oral Motor/Sensory Function: Other (comment) (patient did not allow for full assessment)   Ice Chips Ice chips: Impaired Pharyngeal Phase Impairments: Suspected delayed Swallow;Decreased hyoid-laryngeal  movement;Throat Clearing - Delayed;Change in Vital Signs Other Comments: RR increased from mid 20's to mid-30's, O2 saturation remained steady but was in mid to upper 80%'s   Thin Liquid Thin Liquid: Not tested    Nectar Thick Nectar Thick Liquid: Not tested   Honey Thick Honey Thick Liquid: Not tested   Puree Puree: Not tested   Solid     Solid: Not tested     Sonia Baller, MA, CCC-SLP Speech Therapy

## 2020-10-18 NOTE — Progress Notes (Signed)
  Echocardiogram 2D Echocardiogram has been performed.  Tiffany G Dance 10/18/2020, 1:08 PM

## 2020-10-18 NOTE — ED Notes (Signed)
Breakfast tray given. °

## 2020-10-18 NOTE — ED Notes (Signed)
Recollect needed lab, (green tube)

## 2020-10-19 DIAGNOSIS — G9341 Metabolic encephalopathy: Secondary | ICD-10-CM

## 2020-10-19 DIAGNOSIS — Z515 Encounter for palliative care: Secondary | ICD-10-CM

## 2020-10-19 DIAGNOSIS — M25552 Pain in left hip: Secondary | ICD-10-CM

## 2020-10-19 LAB — RENAL FUNCTION PANEL
Albumin: 3.4 g/dL — ABNORMAL LOW (ref 3.5–5.0)
Anion gap: 12 (ref 5–15)
BUN: 116 mg/dL — ABNORMAL HIGH (ref 8–23)
CO2: 26 mmol/L (ref 22–32)
Calcium: 9.9 mg/dL (ref 8.9–10.3)
Chloride: 106 mmol/L (ref 98–111)
Creatinine, Ser: 1.96 mg/dL — ABNORMAL HIGH (ref 0.61–1.24)
GFR, Estimated: 32 mL/min — ABNORMAL LOW (ref >60)
Glucose, Bld: 328 mg/dL — ABNORMAL HIGH (ref 70–99)
Phosphorus: 2.8 mg/dL (ref 2.5–4.6)
Potassium: 4.5 mmol/L (ref 3.5–5.1)
Sodium: 144 mmol/L (ref 135–145)

## 2020-10-19 LAB — GLUCOSE, CAPILLARY
Glucose-Capillary: 301 mg/dL — ABNORMAL HIGH (ref 70–99)
Glucose-Capillary: 214 mg/dL — ABNORMAL HIGH (ref 70–99)
Glucose-Capillary: 220 mg/dL — ABNORMAL HIGH (ref 70–99)
Glucose-Capillary: 274 mg/dL — ABNORMAL HIGH (ref 70–99)
Glucose-Capillary: 271 mg/dL — ABNORMAL HIGH (ref 70–99)

## 2020-10-19 LAB — CBC
HCT: 42.2 % (ref 39.0–52.0)
Hemoglobin: 12.7 g/dL — ABNORMAL LOW (ref 13.0–17.0)
MCH: 30.5 pg (ref 26.0–34.0)
MCHC: 30.1 g/dL (ref 30.0–36.0)
MCV: 101.2 fL — ABNORMAL HIGH (ref 80.0–100.0)
Platelets: 264 10*3/uL (ref 150–400)
RBC: 4.17 MIL/uL — ABNORMAL LOW (ref 4.22–5.81)
RDW: 14.6 % (ref 11.5–15.5)
WBC: 16.7 10*3/uL — ABNORMAL HIGH (ref 4.0–10.5)
nRBC: 0 % (ref 0.0–0.2)

## 2020-10-19 LAB — HEMOGLOBIN A1C
Hgb A1c MFr Bld: 7.9 % — ABNORMAL HIGH (ref 4.8–5.6)
Mean Plasma Glucose: 180.03 mg/dL

## 2020-10-19 LAB — MAGNESIUM: Magnesium: 2.6 mg/dL — ABNORMAL HIGH (ref 1.7–2.4)

## 2020-10-19 MED ORDER — INSULIN ASPART 100 UNIT/ML ~~LOC~~ SOLN
0.0000 [IU] | SUBCUTANEOUS | Status: DC
  Administered 2020-10-19 (×2): 5 [IU] via SUBCUTANEOUS
  Administered 2020-10-20: 3 [IU] via SUBCUTANEOUS
  Administered 2020-10-20 (×4): 2 [IU] via SUBCUTANEOUS
  Administered 2020-10-20 – 2020-10-21 (×5): 3 [IU] via SUBCUTANEOUS

## 2020-10-19 MED ORDER — INSULIN ASPART 100 UNIT/ML ~~LOC~~ SOLN
0.0000 [IU] | Freq: Three times a day (TID) | SUBCUTANEOUS | Status: DC
  Administered 2020-10-19 (×2): 3 [IU] via SUBCUTANEOUS

## 2020-10-19 MED ORDER — OSMOLITE 1.2 CAL PO LIQD
1000.0000 mL | ORAL | Status: DC
  Administered 2020-10-19: 1000 mL
  Filled 2020-10-19 (×2): qty 1000

## 2020-10-19 MED ORDER — NEPRO/CARBSTEADY PO LIQD
237.0000 mL | Freq: Every day | ORAL | Status: DC
  Administered 2020-10-19 – 2020-10-20 (×4): 237 mL
  Filled 2020-10-19 (×13): qty 237

## 2020-10-19 MED ORDER — INSULIN ASPART 100 UNIT/ML ~~LOC~~ SOLN: 0.0000 [IU] | Freq: Every day | SUBCUTANEOUS | Status: DC

## 2020-10-19 MED ORDER — ACETAMINOPHEN 500 MG PO TABS
500.0000 mg | ORAL_TABLET | Freq: Three times a day (TID) | ORAL | Status: DC
  Administered 2020-10-19 – 2020-10-21 (×4): 500 mg via ORAL
  Filled 2020-10-19 (×4): qty 1

## 2020-10-19 MED ORDER — FREE WATER
120.0000 mL | Freq: Every day | Status: DC
  Administered 2020-10-19 – 2020-10-20 (×4): 120 mL

## 2020-10-19 NOTE — Plan of Care (Addendum)

## 2020-10-19 NOTE — Evaluation (Signed)
Physical Therapy Evaluation Patient Details Name: [name] MRN: [id number] DOB: 03-May-1931 Today's Date: 10/19/2020   History of Present Illness  Pt is an 87 year old man admitted on 10/17/20 for acute respiratory failure with hypoxemia in the setting of acute on chronic combined CHF, and constipation. PMH: CHF with EF 25-30%, ICD, HTN, [name] 3, carotid stent, throat ca, G-tube, DM2, MI, PVD, percardial effusion, TIA.  Clinical Impression  Pt admitted with above diagnosis.  Pt currently with functional limitations due to the deficits listed below (see PT Problem List). Pt will benefit from skilled PT to increase their independence and safety with mobility to allow discharge to the venue listed below.  Pt assisted to [facility] and then over to recliner.  Pt reports pain left buttocks (wound observed near left IT and RN notified).  Pt's SPO2 90% on room air after transfer so reapplied O2 [place] however set on 1L (was on 2L upon arrival and RN aware).  Pt from independent living and receives PACE services per chart.  Pt may need assist upon d/c.     Follow Up Recommendations Home health PT;Supervision for mobility/OOB    Equipment Recommendations  None recommended by PT    Recommendations for Other Services       Precautions / Restrictions Precautions Precautions: Fall Precaution Comments: G-tube Restrictions Weight Bearing Restrictions: No      Mobility  Bed Mobility Overal bed mobility: Needs Assistance Bed Mobility: Supine to Sit     Supine to sit: Min assist     General bed mobility comments: assist for trunk upright    Transfers Overall transfer level: Needs assistance Equipment used: Rolling walker (2 wheeled) Transfers: Sit to/from Stand Sit to Stand: Min assist Stand pivot transfers: Min assist       General transfer comment: multimodal cues for technique, assist to rise and steady, improved stability with UE support  Ambulation/Gait Ambulation/Gait assistance: Min  assist Gait Distance (Feet): 3 Feet Assistive device: Rolling walker (2 wheeled) Gait Pattern/deviations: Step-through pattern;Decreased stride length;Shuffle     General Gait Details: verbal cues for sequencing and RW positioning, BSC to recliner  Stairs            Wheelchair Mobility    Modified Rankin (Stroke Patients Only)       Balance Overall balance assessment: Needs assistance         Standing balance support: Bilateral upper extremity supported;During functional activity Standing balance-Leahy Scale: Poor Standing balance comment: reliant on external support                             Pertinent Vitals/Pain Pain Assessment: Faces Faces Pain Scale: Hurts even more Pain Location: left buttocks Pain Descriptors / Indicators: Sore Pain Intervention(s): Monitored during session;Repositioned    Home Living Family/patient expects to be discharged to:: Other (Comment)   Available Help at Discharge: Personal care attendant Type of Home: Independent living facility         Home Equipment: Walker - 4 wheels;Cane - quad;Bedside commode;Shower seat;Cane - single point;Adaptive equipment      Prior Function Level of Independence: Needs assistance   Gait / Transfers Assistance Needed: walks with a rollator  ADL's / Homemaking Assistance Needed: son sponge bathes him, has an aide to help with IADL 3 hours a day except when he goes to the [facility]ACE center on Wednesdays, dresses himself with use of AE (per last admission)  Hand Dominance        Extremity/Trunk Assessment        Lower Extremity Assessment Lower Extremity Assessment: Generalized weakness       Communication   Communication: HOH  Cognition Arousal/Alertness: Awake/alert Behavior During Therapy: WFL for tasks assessed/performed Overall Cognitive Status: Difficult to assess                                 General Comments: HOH, did not answer PLOF, only  c/o Left buttocks pain (wound present, RN notified)      General Comments      Exercises     Assessment/Plan    PT Assessment Patient needs continued PT services  PT Problem List Decreased strength;Decreased mobility;Decreased activity tolerance;Decreased balance;Decreased safety awareness;Decreased skin integrity       PT Treatment Interventions DME instruction;Therapeutic activities;Gait training;Therapeutic exercise;Patient/family education;Functional mobility training;Balance training    PT Goals (Current goals can be found in the Care Plan section)  Acute Rehab PT Goals PT Goal Formulation: With patient Time For Goal Achievement: Nov 18, 2020 Potential to Achieve Goals: Good    Frequency Min 2X/week   Barriers to discharge        Co-evaluation               AM-PAC PT "6 Clicks" Mobility  Outcome Measure Help needed turning from your back to your side while in a flat bed without using bedrails?: None Help needed moving from lying on your back to sitting on the side of a flat bed without using bedrails?: A Little Help needed moving to and from a bed to a chair (including a wheelchair)?: A Little Help needed standing up from a chair using your arms (e.g., wheelchair or bedside chair)?: A Little Help needed to walk in hospital room?: A Little Help needed climbing 3-5 steps with a railing? : A Lot 6 Click Score: 18    End of Session Equipment Utilized During Treatment: Gait belt Activity Tolerance: Patient tolerated treatment well Patient left: in chair;with call bell/phone within reach;with chair alarm set Nurse Communication: Mobility status PT Visit Diagnosis: Unsteadiness on feet (R26.81);Muscle weakness (generalized) (M62.81);Difficulty in walking, not elsewhere classified (R26.2)    Time: 7741-4239 PT Time Calculation (min) (ACUTE ONLY): 21 min   Charges:   PT Evaluation $PT Eval Low Complexity: 1 Low         Kati PT, DPT Acute Rehabilitation  Services Pager: [phone] Office: [phone]  Trena Platt 10/19/2020, 12:31 PM

## 2020-10-19 NOTE — Evaluation (Signed)
Occupational Therapy Evaluation Patient Details Name: [name] MRN: [id number] DOB: 1931-07-11 Today's Date: 10/19/2020    History of Present Illness Pt is an 87 year old man admitted on 10/17/20 for acute respiratory failure with hypoxemia in the setting of acute on chronic combined CHF, and constipation. PMH: CHF with EF 25-30%, ICD, HTN, [name] 3, carotid stent, throat ca, G-tube, DM2, MI, PVD, percardial effusion, TIA.   Clinical Impression   Pt from ILF where per case management who spoke with son he uses rw, has custodial level care 12hrs-3days a week, Day center-Wednesday(usually cancels per [facility] rep [place]). Pt with frequent recent hospitalizations. Today Pt min A for transfers with RW with cues for safety (similar to previous admissions) Pt with limited activity tolerance due to L buttocks). At this time recommending HHOT to maximize safety and independence in ADL and functional transfers with RW. OT will follow acutely.    Follow Up Recommendations  Home health OT    Equipment Recommendations  None recommended by OT    Recommendations for Other Services Other (comment) (Palliative - either acutely or community)     Precautions / Restrictions Precautions Precautions: Fall Precaution Comments: G-tube Restrictions Weight Bearing Restrictions: No      Mobility Bed Mobility Overal bed mobility: Needs Assistance Bed Mobility: Sit to Supine     Supine to sit: Min assist Sit to supine: Min assist   General bed mobility comments: assist for BLE    Transfers Overall transfer level: Needs assistance Equipment used: Rolling walker (2 wheeled) Transfers: Sit to/from [facility] Sit to Stand: Min assist Stand pivot transfers: Min assist       General transfer comment: assist for boost, vc for safety    Balance Overall balance assessment: Needs assistance Sitting-balance support: Feet supported Sitting balance-Leahy Scale: Fair     Standing  balance support: Bilateral upper extremity supported;During functional activity Standing balance-Leahy Scale: Poor Standing balance comment: reliant on external support                           ADL either performed or assessed with clinical judgement   ADL Overall ADL's : Needs assistance/impaired Eating/Feeding: NPO   Grooming: Min guard;Wash/dry hands;Standing Grooming Details (indicate cue type and reason): sink in room   Upper Body Bathing Details (indicate cue type and reason): dependent at baseline   Lower Body Bathing Details (indicate cue type and reason): dependent at baseline Upper Body Dressing : Minimal assistance;Sitting   Lower Body Dressing: Maximal assistance;Sit to/from stand Lower Body Dressing Details (indicate cue type and reason): Pt is used to using sock donner Toilet Transfer: Minimal assistance;Ambulation;RW   Toileting- Clothing Manipulation and Hygiene: Minimal assistance;Sit to/from stand Toileting - Clothing Manipulation Details (indicate cue type and reason): warm wash cloth for peri care     Functional mobility during ADLs: Minimal assistance;Min guard;Rolling walker;Cueing for safety       Vision         Perception     Praxis      Pertinent Vitals/Pain Pain Assessment: Faces Faces Pain Scale: Hurts even more Pain Location: left buttocks Pain Descriptors / Indicators: Sore;Discomfort;Grimacing Pain Intervention(s): Monitored during session;Repositioned     Hand Dominance Right   Extremity/Trunk Assessment Upper Extremity Assessment Upper Extremity Assessment: Generalized weakness   Lower Extremity Assessment Lower Extremity Assessment: Defer to PT evaluation       Communication Communication Communication: HOH   Cognition Arousal/Alertness: Awake/alert Behavior During  Therapy: WFL for tasks assessed/performed Overall Cognitive Status: No family/caregiver present to determine baseline cognitive functioning                                  General Comments: HOH, distracted by pain   General Comments       Exercises     Shoulder Instructions      Home Living Family/patient expects to be discharged to:: Other (Comment) (ILF) Living Arrangements: Children Available Help at Discharge: Personal care attendant Type of Home: Independent living facility                       Home Equipment: Walker - 4 wheels;Cane - quad;Bedside commode;Shower seat;Cane - single point;Adaptive equipment          Prior Functioning/Environment Level of Independence: Needs assistance  Gait / Transfers Assistance Needed: walks with a rollator ADL's / Homemaking Assistance Needed: son sponge bathes him, has an aide to help with IADL 3 hours a day except when he goes to the [facility]ACE center on Wednesdays, dresses himself with use of AE (per last admission)   Comments: Uses rw, has custodial level care 12hrs-3days a week, Day center-Wednesday(usually cancels per [facility]ACE rep Jaclyn Shaggy) - from case management note        OT Problem List: Decreased activity tolerance;Impaired balance (sitting and/or standing);Decreased knowledge of use of DME or AE      OT Treatment/Interventions: Self-care/ADL training;DME and/or AE instruction;Patient/family education;Balance training    OT Goals(Current goals can be found in the care plan section) Acute Rehab OT Goals Patient Stated Goal: decrease pain OT Goal Formulation: With patient Time For Goal Achievement: 11/25/20 Potential to Achieve Goals: Fair ADL Goals Pt Will Perform Grooming: with supervision;standing Pt Will Perform Upper Body Dressing: with modified independence;sitting Pt Will Perform Lower Body Dressing: with supervision;sit to/from stand;with adaptive equipment Pt Will Transfer to Toilet: with supervision;ambulating Pt Will Perform Toileting - Clothing Manipulation and hygiene: with supervision;sit to/from stand  OT Frequency: Min 2X/week    Barriers to D/C:            Co-evaluation              AM-PAC OT "6 Clicks" Daily Activity     Outcome Measure Help from another person eating meals?: Total (NPO) Help from another person taking care of personal grooming?: A Little Help from another person toileting, which includes using toliet, bedpan, or urinal?: A Little Help from another person bathing (including washing, rinsing, drying)?: A Little Help from another person to put on and taking off regular upper body clothing?: A Little Help from another person to put on and taking off regular lower body clothing?: A Lot 6 Click Score: 15   End of Session Equipment Utilized During Treatment: Rolling walker;Gait belt Nurse Communication: Mobility status  Activity Tolerance: Patient tolerated treatment well Patient left: in bed;with bed alarm set;with call bell/phone within reach  OT Visit Diagnosis: Unsteadiness on feet (R26.81);Other abnormalities of gait and mobility (R26.89);Muscle weakness (generalized) (M62.81)                Time: 3557-3220 OT Time Calculation (min): 23 min Charges:  OT General Charges $OT Visit: 1 Visit OT Evaluation $OT Eval Moderate Complexity: 1 Mod OT Treatments $Self Care/Home Management : 8-22 mins  Jesse Sans OTR/L Acute Rehabilitation Services Pager: [phone] Office: [place] 10/19/2020, 3:52 PM

## 2020-10-19 NOTE — Consult Note (Signed)
Consultation Note Date: 10/19/2020   Patient Name: [name]  DOB: 1931/06/03  MRN: [id number]  Age / Sex: 87 y.o., male  PCP: Axel Filler, MD Referring Physician: Mercy Riding, MD  Reason for Consultation: Establishing goals of care  HPI/Patient Profile: 87 y.o. male  with past medical history of glottic cancer in remission, G-tube dependence, combined CHF, AICD, DM type 2, [name] stage IV admitted on 10/17/2020 with constipation and shortness of breath. CXR reveals cardiomegaly, pulmonary edema, and possible atypical pneumonia. KUB without obstruction. TTE with EF 20-25%. Hospital admission for acute respiratory failure secondary to acute on chronic CHF, acute metabolic encephalopathy, constipation on bowel regimen. Palliative medicine consultation for goals of care.    Clinical Assessment and Goals of Care: PMT consult received and chart reviewed. Discussed with RN. Visited with [name] at bedside. He is very irritable this afternoon and is not in a good place to discuss goals of care. He is frustrated and confused, thinking he is wet from condom catheter? It does not appear to be leaking. I asked RN to evaluate. He also mentions he has not gotten food through his tube. He states "I should have gone to [facility]." Per RN, he has been intermittently confused and irritable this afternoon. No family at bedside.   Spoke with son, Jamaar via telephone. Introduced role of palliative medicine.   Rigel shares that his father lives at independent living. Ambulates with walker at baseline and receives 4 shakes a day via G-tube. He receives great support from PACE program but son reports he is stubborn and not always willing to participate in various activities or therapies. Depends on the day.   Mitchelle has spoken with Dr. Cyndia Skeeters. Reviewed course of hospitalization including diagnoses, interventions, plan of  care.   Discussed advance directives and code status. Patient does not have a documented living will. Patient has one son and one daughter. Son Holger is local and primary contact. Explored whether his father has spoken of EOL wishes. Kelson states "do what you gotta do, give him a chance." and "give him a fight chance." Muzamil wishes for FULL code and shares his belief that his father would want the same. Encouraged ongoing discussions and consideration of completion advance directives.   Eero does not have much time to talk as he arrives at work. Answered questions.     SUMMARY OF RECOMMENDATIONS    Continue full code/full scope treatment.  Followed by PACE in the community.   Does not have documented living will/HCPOA. Patient has two children who are default POA. Encouraged son to continue conversations with his father regarding wishes. Son does desire FULL code status "give him a fight chance" and believes his father feels the same.  Unable to have discussion with patient this afternoon as he is irritable, frustrated, and confused.   PACE provides palliative support and can continue [place] discussions outpatient.   Code Status/Advance Care Planning:  Full code  Symptom Management:   Per attending  Palliative Prophylaxis:  Delirium Protocol and Frequent Pain Assessment  Additional Recommendations (Limitations, Scope, Preferences):  Full Scope Treatment  Psycho-social/Spiritual:   Desire for further Chaplaincy support:yes  Additional Recommendations: Caregiving  Support/Resources  Prognosis:   Unable to determine  Discharge Planning: To Be Determined      Primary Diagnoses: Present on Admission: . Malignant neoplasm of glottis ([place]) . Type 2 diabetes mellitus with peripheral vascular disease ([place]) . Acute on chronic combined systolic and diastolic CHF (congestive heart failure) ([place]) . [name] (chronic kidney disease) stage 4, GFR 15-29 ml/min (HCC) . Acute  respiratory failure with hypoxia ([place])   I have reviewed the medical record, interviewed the patient and family, and examined the patient. The following aspects are pertinent.  Past Medical History:  Diagnosis Date  . Adenomatous colon polyp 02/14/2012  . AICD (automatic cardioverter/defibrillator) present    Medtronic   . BBB (bundle branch block)    right  . Carotid stenosis    a. s/p L carotid stent 2004;  b. Carotid US (09/2014): Bilateral ICA 1-39%, left ECA >59%, normal subclavian bilaterally, occluded left vertebral >> FU 2 years  . Chronic kidney disease   . Chronic systolic CHF (congestive heart failure) (HCC)    a. ischemic CM EF 15-20%;  b. s/p AICD 05/24/04;  c. Echo 7/06: EF 30-40%, mild reduced RVSF, d. Echo 12/2015 EF 35-40%  . Elevated PSA   . History of kidney stones    x 1  . HTN (hypertension)   . Hyperlipidemia   . Hyponatremia 09/22/2020  . ICD (implantable cardiac defibrillator) in place 12-25-2012   MDT CRTD upgrade by Dr Lovena Le  . Myocardial infarction ([place]) 1990  . Pericardial effusion    Echocardiogram (09/2014): EF 25% with distal anterior, distal inferior, distal lateral and apical akinesis, grade 1 diastolic dysfunction, very mild aortic stenosis (mean 7 mmHg) - this may be depressed due to low EF (2-D images suggest mild to moderate aortic stenosis), large pericardial effusion, no RA collapse  . Pneumonia   . Presence of permanent cardiac pacemaker    Medtronic  . PVD (peripheral vascular disease) ([place])    s/p L carotid PTCA/stent 2004  . Transient ischemic attack   . Type II diabetes mellitus ([place])    type II   Social History   Socioeconomic History  . Marital status: Widowed    Spouse name: Not on file  . Number of children: Not on file  . Years of education: 1  . Highest education level: Not on file  Occupational History  . Occupation: retired  Tobacco Use  . Smoking status: Former Smoker    Types: Cigarettes    Quit date: 12/27/1967     Years since quitting: 52.8  . Smokeless tobacco: Never Used  Vaping Use  . Vaping Use: Never used  Substance and Sexual Activity  . Alcohol use: No    Alcohol/week: 0.0 standard drinks    Comment: 12/25/2012 "quit all alcohol 60 yr ago"  . Drug use: No  . Sexual activity: Not on file  Other Topics Concern  . Not on file  Social History Narrative   ** Merged History Encounter **       Single, 2 adult children, daughter in [place], son in [place] Strain: Not on [employer] Insecurity: Not on file  Transportation Needs: Not on file  Physical Activity: Not on file  Stress: Not on file  Social Connections: Not  on file   Family History  Problem Relation Age of Onset  . Diabetes Mother   . Healthy Father   . Diabetes Brother   . Heart attack Neg Hx   . Stroke Neg Hx    Scheduled Meds: . acetaminophen  500 mg Oral Q8H  . atorvastatin  40 mg Per Tube q1800  . carvedilol  6.25 mg Per Tube BID WC  . enoxaparin (LOVENOX) injection  30 mg Subcutaneous Q24H  . feeding supplement (NEPRO CARB STEADY)  237 mL Per Tube 5 X Daily  . free water  120 mL Per Tube 5 X Daily  . furosemide  20 mg Intravenous Daily  . insulin aspart  0-9 Units Subcutaneous Q4H  . insulin glargine  8 Units Subcutaneous Daily  . pantoprazole sodium  40 mg Per Tube Daily  . sodium chloride flush  3 mL Intravenous Q12H  . trimethoprim-polymyxin b  1 drop Both Eyes QID   Continuous Infusions: . sodium chloride    . feeding supplement (OSMOLITE 1.2 CAL)     PRN Meds:.sodium chloride, albuterol, magnesium citrate, morphine injection, ondansetron (ZOFRAN) IV, polyethylene glycol, polyvinyl alcohol, prochlorperazine, senna-docusate, sodium chloride flush Medications Prior to Admission:  Prior to Admission medications   Medication Sig Start Date End Date Taking? Authorizing Provider  acetaminophen (TYLENOL) 325 MG tablet Take 650 mg by mouth every 12 (twelve)  hours as needed for mild pain.   Yes [provider]  Amino Acids-Protein Hydrolys (FEEDING SUPPLEMENT, PRO-STAT SUGAR FREE 64,) LIQD Place 30 mLs into feeding tube daily. 11/30/19  Yes Seawell, Jaimie A, DO  aspirin EC 81 MG tablet 81 mg daily. Per tube   Yes [provider]  atorvastatin (LIPITOR) 40 MG tablet TAKE 1 TABLET(40 MG) BY TUBE DAILY AT 6 PM Patient taking differently: Take 40 mg by mouth daily. 10/11/20  Yes Katsadouros, Vasilios, MD  bisoprolol (ZEBETA) 5 MG tablet Take 5 mg by mouth daily.   Yes [provider]  insulin glargine (LANTUS) 100 UNIT/ML injection Inject 0.08 mLs (8 Units total) into the skin daily. 12/04/19  Yes Jeanmarie Hubert, MD  lansoprazole (PREVACID SOLUTAB) 30 MG disintegrating tablet Place 30 mg into feeding tube daily.   Yes [provider]  Nutritional Supplements (FEEDING SUPPLEMENT, NEPRO CARB STEADY,) LIQD Place 237 mLs into feeding tube 4 (four) times daily. Patient taking differently: Place 240 mLs into feeding tube 4 (four) times daily. 12/08/19  Yes Christian, Rylee, MD  polyvinyl alcohol (LIQUIFILM TEARS) 1.4 % ophthalmic solution Place 1 drop into both eyes 4 (four) times daily as needed for dry eyes.   Yes [provider]  VENTOLIN HFA 108 (90 Base) MCG/ACT inhaler INHALE 2 PUFFS INTO THE LUNGS EVERY 6 HOURS AS NEEDED FOR WHEEZING OR SHORTNESS OF BREATH Patient taking differently: Inhale 2 puffs into the lungs every 6 (six) hours as needed for wheezing or shortness of breath. 03/15/16  Yes Axel Filler, MD  Blood Glucose Monitoring Suppl (ACCU-CHEK AVIVA PLUS) w/Device KIT Check finger stick glucose once daily 05/06/18   Axel Filler, MD  glucose blood (ACCU-CHEK AVIVA PLUS) test strip Check blood sugar up to 3 times a day 09/15/19   Axel Filler, MD  Insulin Pen Needle (B-D ULTRAFINE III SHORT PEN) 31G X 8 MM MISC USE ONCE DAILY WITH LANTUS PEN 05/06/18   Axel Filler, MD    No Known Allergies Review of Systems  Unable to perform ROS: Other    Physical Exam  Vitals and nursing note reviewed.  Constitutional:      General: He is awake.  HENT:     Head: Normocephalic and atraumatic.  Pulmonary:     Effort: No tachypnea, accessory muscle usage or respiratory distress.  Skin:    General: Skin is warm and dry.  Neurological:     Mental Status: He is alert.     Comments: ? Orientation. Patient irritable and not willing to participate in discussion this afternoon.     Vital Signs: BP 118/66 (BP Location: Right Arm)   Pulse 98   Temp 98.7 F (37.1 C) (Oral)   Resp 19   Ht $R'5\' 11"'DU$  (1.803 m)   Wt 63.2 kg   SpO2 93%   BMI 19.43 kg/m  Pain Scale: 0-10   Pain Score:  (pt would not state number but complains of bad pain)   SpO2: SpO2: 93 % O2 Device:SpO2: 93 % O2 Flow Rate: .O2 Flow Rate (L/min): 1 L/min  IO: Intake/output summary:   Intake/Output Summary (Last 24 hours) at 10/19/2020 1505 Last data filed at 10/19/2020 0600 Gross per 24 hour  Intake 1391.06 ml  Output --  Net 1391.06 ml    LBM: Last BM Date: 10/19/20 (seen on incontinence pad) Baseline Weight: Weight: 65 kg Most recent weight: Weight: 63.2 kg     Palliative Assessment/Data: PPS 40%    Time Total: 30 Greater than 50%  of this time was spent counseling and coordinating care related to the above assessment and plan.  Signed by:  Ihor Dow, DNP, FNP-C Palliative Medicine Team  Phone: [phone] Fax: [phone]   Please contact Palliative Medicine Team phone at [phone] for questions and concerns.  For individual provider: See Shea Evans

## 2020-10-19 NOTE — Progress Notes (Signed)
Initial Nutrition Assessment  DOCUMENTATION CODES:   Severe malnutrition in context of chronic illness  INTERVENTION:   -increase bolus regimen: Nepro Carb Steady (237 mL, 5x/day) providing in total 2100 kcal, 95 g protein, 860 mL free water. -discontinue Pro-Stat Sugar Free 64 (30 mL, daily) -initiate free water flushes of 60 mL before and after each bolus feeding (600 mL)   NUTRITION DIAGNOSIS:   Severe Malnutrition related to chronic illness,cancer and cancer related treatments as evidenced by severe fat depletion,severe muscle depletion.  GOAL:   Patient will meet greater than or equal to 90% of their needs  MONITOR:   TF tolerance,Weight trends,Labs,Skin  REASON FOR ASSESSMENT:   Consult Enteral/tube feeding initiation and management  ASSESSMENT:    47 YOM who is G-tube dependent. PMH of malignant glottis neoplasm in remission, [name] 4, DM-2, CHF (diastolic and systolic). Pt presented to the ED with constipation, shortness of breath and was admitted for acute respiratory failure with hypoxemia.  Notes indicated pt was alert and oriented only to himself. Pt was unable to give any nutrition history and no family was present to assist. Intern then proceeded to perform NFPE on pt.   MBS was performed 01/24 indicating pt is at a severe risk for aspiration. Pt is currently NPO with a G-tube. Pt had G-tube placed on 11/27/19.   RD discussed pt with nurse and nursing student and was informed that the pt had not yet received his tube feeding. Pt is currently on bolus regimen of Nepro Carb Steady (240 mL, 4x/day) providing in total 1680 kcal, 76 g protein, 688 g water. Pt is also receiving Pro-Stat Sugar Free 64 (30 mL, daily) providing 100 kcal, 15 g protein.   Pt's weights are relatively steady with minor fluctuations since 12/19/19. Outlier noted on 01/04. Will continue monitoring weight.   Pt has been experiencing constipation and had been experiencing it 3 days PTA.   Meds  reviewed: Nepro Carb Steady (4x/day), Pro-Stat SF 64 (daily), Lasix 20 mg (daily) Labs reviewed: 01/25; Glucose 214 mg/dL, BUN 116 mg/dL, A1C 7.9%, GFR 57mL/min  NUTRITION - FOCUSED PHYSICAL EXAM:  Flowsheet Row Most Recent Value  Orbital Region Severe depletion  Upper Arm Region Severe depletion  Thoracic and Lumbar Region Severe depletion  Buccal Region Severe depletion  Temple Region Severe depletion  Clavicle Bone Region Severe depletion  Clavicle and Acromion Bone Region Severe depletion  Scapular Bone Region Severe depletion  Dorsal Hand Severe depletion  Patellar Region Severe depletion  Anterior Thigh Region Severe depletion  Posterior Calf Region Severe depletion  Edema (RD Assessment) None  Hair Reviewed  Eyes Reviewed  Mouth Reviewed  Skin Reviewed  Nails Reviewed       Diet Order:   Diet Order            Diet NPO time specified Except for: Ice Chips  Diet effective now                 EDUCATION NEEDS:   No education needs have been identified at this time  Skin:  Skin Assessment: Reviewed RN Assessment (noted small, pink wounds on pt's left shin.)  Last BM:  10/20/19 (type 6)  Height:   Ht Readings from Last 1 Encounters:  10/17/20 5\' 11"  (1.803 m)    Weight:   Wt Readings from Last 1 Encounters:  10/19/20 63.2 kg    Ideal Body Weight:  78.2 kg  BMI:  Body mass index is 19.43 kg/m.  Estimated Nutritional Needs:  Kcal:  1900-2100  Protein:  95-110  Fluid:  >1.9 L    Salvadore Oxford, Dietetic Intern 10/19/2020 11:36 AM

## 2020-10-19 NOTE — TOC Initial Note (Signed)
Transition of Care [name]) - Initial/Assessment Note    Patient Details  Name: [name] MRN: [id number] Date of Birth: 05-Jun-1931  [facility] CM/SW Contact:    Dessa Phi, RN Phone Number: 10/19/2020, 10:05 AM  Clinical Narrative: Spoke to patient's son Lonzo-d/c plans return back to [facility]. Uses rw, has custodial level care 12hrs-3days a week, Day center-Wednesday(usually cancels per [facility] rep Jaclyn Shaggy), has GT-pro stat(indep) noted PT cons. [facility] rep Colleen-they were exploring with Son respite level services. [facility] will provide transport if needed @ d/c.                   Barriers to Discharge: Continued Medical Work up   Patient Goals and CMS Choice Patient states their goals for this hospitalization and ongoing recovery are:: return back home-[facility] CMS Medicare.gov Compare Post Acute Care list provided to:: Patient Represenative (must comment) Choice offered to / list presented to : Adult Children  Expected Discharge Plan and Services     Discharge Planning Services: CM Consult   Living arrangements for the past 2 months: [place] Expected Discharge Date:  (unknown)                                    Prior Living Arrangements/Services Living arrangements for the past 2 months: [place] Lives with:: Self Patient language and need for interpreter reviewed:: Yes Do you feel safe going back to the place where you live?: Yes      Need for Family Participation in Patient Care: No (Comment) Care giver support system in place?: Yes (comment) Current home services: DME (rw, G tube supplies-[facility] manages) Criminal Activity/Legal Involvement Pertinent to Current Situation/Hospitalization: No - Comment as needed  Activities of Daily Living Home Assistive Devices/Equipment: Cane (specify quad or straight),Walker (specify type),CBG Meter,Feeding equipment,Other (Comment) (single point cane,  front wheeled walker, peg tube) ADL Screening (condition at time of admission) Patient's cognitive ability adequate to safely complete daily activities?: No Is the patient deaf or have difficulty hearing?: Yes Does the patient have difficulty seeing, even when wearing glasses/contacts?: No Does the patient have difficulty concentrating, remembering, or making decisions?: Yes Patient able to express need for assistance with ADLs?: Yes Does the patient have difficulty dressing or bathing?: No Independently performs ADLs?: Yes (appropriate for developmental age) Does the patient have difficulty walking or climbing stairs?: Yes (secondary to weakness) Weakness of Legs: Both Weakness of Arms/Hands: None  Permission Sought/Granted Permission sought to share information with : Case Manager Permission granted to share information with : Yes, Verbal Permission Granted  Share Information with NAME: Case manager     Permission granted to share info w Relationship: Britten Seyfried [phone]     Emotional Assessment Appearance:: Appears stated age Attitude/Demeanor/Rapport: Gracious Affect (typically observed): Accepting Orientation: : Oriented to Self,Oriented to Place,Oriented to  Time,Oriented to Situation Alcohol / Substance Use: Not Applicable Psych Involvement: No (comment)  Admission diagnosis:  Acute respiratory failure with hypoxia (HCC) [J96.01] Acute on chronic combined systolic and diastolic CHF (congestive heart failure) ([place]) [I50.43] Dyspnea, unspecified type [R06.00] Patient Active Problem List   Diagnosis Date Noted  . [name] (chronic kidney disease) stage 4, GFR 15-29 ml/min (HCC) 10/18/2020  . Acute respiratory failure with hypoxia ([place]) 10/18/2020  . Acute exacerbation of CHF (congestive heart failure) ([place]) 10/09/2020  . Hyponatremia 09/22/2020  . VT (ventricular tachycardia) (  [place]) 03/10/2020  . History of recent fall 01/27/2020  . Hyperkalemia 12/05/2019  . Aspiration  of food   . Protein-calorie malnutrition, severe 11/22/2019  . Dysphagia 11/21/2019  . Enlarged prostate   . Malignant neoplasm of glottis ([place]) 07/10/2019  . Pharyngeal dysphagia 07/10/2019  . Dysphonia 07/10/2019  . Hypertensive retinopathy of both eyes 02/05/2018  . Mild nonproliferative diabetic retinopathy of both eyes without macular edema associated with type 2 diabetes mellitus ([place]) 02/05/2018  . Dry eye syndrome of both eyes 06/06/2017  . Weight loss 12/11/2016  . Advanced care planning/counseling discussion 12/11/2016  . Urinary retention 01/13/2016  . Healthcare maintenance 11/22/2015  . Acute on chronic combined systolic and diastolic CHF (congestive heart failure) ([place]) 11/11/2015  . [name] (chronic kidney disease) stage 3, GFR 30-59 ml/min (HCC) 12/04/2014  . Automatic implantable cardioverter-defibrillator in situ 01/16/2012  . Constipation 03/10/2009  . Coronary atherosclerosis 12/25/2007  . Chronic systolic congestive heart failure ([place]) 12/25/2007  . Hyperlipidemia 01/02/2007  . Type 2 diabetes mellitus with peripheral vascular disease ([place]) 01/01/1989   PCP:  Axel Filler, MD Pharmacy:   [facility] [place], [place] - [address] AT [place] [address] Phone: [phone] Fax: [phone]     Social Determinants of Health (SDOH) Interventions    Readmission Risk Interventions Readmission Risk Prevention Plan 10/19/2020  Transportation Screening Complete  Medication Review ([place]) Complete  PCP or Specialist appointment within 3-5 days of discharge Complete  HRI or [place] Complete  SW Recovery Care/Counseling Consult Complete  [place] Not Applicable  Some recent data might be hidden

## 2020-10-19 NOTE — Progress Notes (Signed)
PROGRESS NOTE  [name] QPR:[id number] DOB: [birth year]   PCP: [name]  Patient is from: Home.  Uses walker.  Lives alone.  [facility]  DOA: 10/17/2020 LOS: 1  Chief complaints: Constipation and shortness of breath  Brief Narrative / Interim history: 87 year old male with history of glottic cancer in remission, G-tube dependence, combined CHF with AICD, DM-2, [name] 4 and debility presenting with constipation and shortness of breath, and admitted for acute respiratory failure with hypoxemia in the setting of acute on chronic combined CHF, and constipation.  Patient has been desaturating to upper 80s on 2 L by [place].  Also with tachypnea in the 20s to 30s.  CXR with cardiomegaly, pulmonary edema and possible atypical pneumonia.  KUB without bowel obstruction.  BNP 970.  He was a started on IV Lasix and bowel regimen.  TTE ordered.  TTE with LVEF of 20 to 25% (previously 25 to 30%), R WMA, G2-DD and large pericardial effusion (chronic).  Respiratory status improved.   SLP recommended n.p.o. He was started on tube feed.  Subjective: Seen and examined earlier this morning.  No major events overnight of this morning.  Somewhat sleepy on bedside chair but awakes to voice.  He is oriented to self although he was oriented to self and place earlier per RN.  He reports pain in his left hip.  Denies chest pain or shortness of breath.  However, he desaturated to 89% when weaned to room air.   Objective: Vitals:   10/19/20 0700 10/19/20 1000 10/19/20 1316 10/19/20 1317  BP:  118/66    Pulse:  98    Resp:  19    Temp:  98.7 F (37.1 C)    TempSrc:  Oral    SpO2: 100% 98% (!) 89% 93%  Weight:      Height:        Intake/Output Summary (Last 24 hours) at 10/19/2020 1414 Last data filed at 10/19/2020 0600 Gross per 24 hour  Intake 1391.06 ml  Output -  Net 1391.06 ml   Filed Weights   10/17/20 1802 10/19/20 0500  Weight: 65 kg 63.2 kg     Examination:  GENERAL: Frail looking elderly male.  No apparent distress. HEENT: MMM.  Vision and hearing grossly intact.  NECK: Supple.  No apparent JVD.  RESP: 93% on 1 L.  No IWOB.  Fair aeration bilaterally. CVS:  RRR. Heart sounds normal.  ABD/GI/GU: BS+. Abd soft, NTND.  MSK/EXT:  Moves extremities. Some pain with ROM in left hip.  Significant muscle mass loss.  1+ pedal edema bilaterally. SKIN: no apparent skin lesion or wound NEURO: Sleepy but wakes to voice.  Oriented x1.  No apparent focal neuro deficit. PSYCH: Calm. Normal affect.  Procedures:  None  Microbiology summarized: [id number] PCR pending.  Assessment & Plan: Acute hypoxemic respiratory failure likely due to acute on chronic CHF.  Pneumonia ruled out.  -Desaturated to 89% on RA.  -Continue IV Lasix as below. -Wean oxygen as able -OOB/PT/OT  Acute on chronic combined CHF: TTE in 02/2020 with LVEF of 20 to 25% (from 25 to 30%), R WMA, G2 DD, large pericardial effusion (chronic) without tamponade. CXR with cardiomegaly and pulmonary edema.  BNP 970 (higher than baseline).  Desaturated to 89% on RA.  Does not appear to be on Lasix at home. -Continue IV Lasix 20 mg daily-may need low-dose Lasix on discharge -GDMT-continue home Coreg.  Could benefit from BiDil.  Not a candidate  for ARNi/ACE inhibitor's/ARB -Monitor fluid status, renal functions and electrolytes.  Acute metabolic encephalopathy?  Delirium?  Sleepy but wakes to voice.  Only oriented to self.  -Treat treatable causes -Reorientation and delirium precautions.  History of glottic cancer in remission Dysphagia/G-tube dependence: SLP recommended n.p.o. [name] and medication per G-tube  [name]-4 with azotemia: Reviewed his prior results and seems to be stable Recent Labs    09/25/20 0230 09/26/20 0329 09/27/20 0421 09/28/20 0504 10/09/20 2009 10/10/20 0340 10/11/20 0313 10/17/20 1808 10/18/20 1009 10/19/20 0545  BUN 91* 91* 89* 93* 152*  148* 133* 111* 114* 116*  CREATININE 2.05* 2.03* 2.05* 2.01* 2.30* 2.31* 2.24* 2.12* 2.27* 1.96*  -Need nephrology follow-up if he has not had one yet  Uncontrolled IDDM-2 with hyperglycemia complicated by [name]-4: L9J 7.9%.  On Lantus 8 units at home. Recent Labs  Lab 10/18/20 1519 10/18/20 2339 10/19/20 0352 10/19/20 0753 10/19/20 1143  GLUCAP 194* 310* 301* 214* 220*  -Discontinue D5 now he is on tube feed. -Continue Lantus 8 units daily -Change SSI to every 4 hours -Continue statin.  Hypernatremia: Due to Lasix?  Resolved. -Free water with tube feed  Constipation: Was not compliant with bowel regimen.  Resolved. -MiraLAX, Senokot-S and mag citrate as needed  Debility/physical deconditioning: Reports using walker at baseline.  [facility] -Home health PT on discharge  Leukocytosis: Unclear source.  Questionable atypical pneumonia on chest x-ray but no fever.  Pro-Cal negative.  UA with large leukocytes but rare bacteria and no nitrites. -Continue monitoring of antibiotics  Left hip pain-some pain ROM in left hip.  X-ray revealed osteoarthritis but no acute osseous finding. -Scheduled Tylenol  Goal of care-patient with significant comorbidities as above. Still full code which I believe would pose more harm than benefit.  I discussed this with patient son, [name] who defers the decision to his father. Patient was somewhat sleepy and only oriented to self.  -Palliative care consult  Severe malnutrition as evidenced by low BMI and significant muscle mass and subcu fat loss Body mass index is 19.43 kg/m. Nutrition Problem: Severe Malnutrition Etiology: chronic illness,cancer and cancer related treatments Signs/Symptoms: severe fat depletion,severe muscle depletion Interventions: Tube feeding,Prostat Pressure Ulcer 11/17/15 Stage II -  Partial thickness loss of dermis presenting as a shallow open ulcer with a red, pink wound bed without slough. Covered with Foam  Mepilex (Active)  11/17/15 1110  Location: Sacrum  Location Orientation:   Staging: Stage II -  Partial thickness loss of dermis presenting as a shallow open ulcer with a red, pink wound bed without slough.  Wound Description (Comments): Covered with Foam Mepilex  Present on Admission: Yes     Pressure Injury 04/22/17 Stage II -  Partial thickness loss of dermis presenting as a shallow open ulcer with a red, pink wound bed without slough. this is a partial thickness fissure in the gluteal cleft related to moisture (Active)  04/22/17 0302  Location: Sacrum  Location Orientation: Mid  Staging: Stage II -  Partial thickness loss of dermis presenting as a shallow open ulcer with a red, pink wound bed without slough.  Wound Description (Comments): this is a partial thickness fissure in the gluteal cleft related to moisture  Present on Admission: Yes   DVT prophylaxis:  enoxaparin (LOVENOX) injection 30 mg Start: 10/18/20 1000  Code Status: Full code Family Communication: Updated patient's son over the phone on 1/24. Level of care: Telemetry Status is: Inpatient  Remains inpatient due to: Altered mental  status, Ongoing diagnostic testing needed not appropriate for outpatient work up, Unsafe d/c plan, IV treatments appropriate due to intensity of illness or inability to take PO and Inpatient level of care appropriate due to severity of illness  Dispo: The patient is from: Home              Anticipated d/c is to: To be determined.              Anticipated d/c date is: 2 days              Patient currently is not medically stable to d/c.   Difficult to place patient No       Consultants:  Palliative medicine   Sch Meds:  Scheduled Meds: . acetaminophen  500 mg Oral Q8H  . atorvastatin  40 mg Per Tube q1800  . carvedilol  6.25 mg Per Tube BID WC  . enoxaparin (LOVENOX) injection  30 mg Subcutaneous Q24H  . feeding supplement (NEPRO CARB STEADY)  237 mL Per Tube 5 X Daily  . free  water  120 mL Per Tube 5 X Daily  . furosemide  20 mg Intravenous Daily  . insulin aspart  0-5 Units Subcutaneous QHS  . insulin aspart  0-9 Units Subcutaneous TID WC  . insulin glargine  8 Units Subcutaneous Daily  . pantoprazole sodium  40 mg Per Tube Daily  . sodium chloride flush  3 mL Intravenous Q12H  . trimethoprim-polymyxin b  1 drop Both Eyes QID   Continuous Infusions: . sodium chloride    . feeding supplement (OSMOLITE 1.2 CAL)     PRN Meds:.sodium chloride, albuterol, magnesium citrate, morphine injection, ondansetron (ZOFRAN) IV, polyethylene glycol, polyvinyl alcohol, prochlorperazine, senna-docusate, sodium chloride flush  Antimicrobials: Anti-infectives (From admission, onward)   None       I have personally reviewed the following labs and images: CBC: Recent Labs  Lab 10/17/20 1808 10/18/20 0808 10/19/20 0545  WBC 10.1 15.7* 16.7*  NEUTROABS 6.8 11.6*  --   HGB 12.6* 13.0 12.7*  HCT 40.6 41.3 42.2  MCV 98.3 98.6 101.2*  PLT 225 198 264   BMP &GFR Recent Labs  Lab 10/17/20 1808 10/18/20 1009 10/19/20 0545  NA 143 148* 144  K 4.9 4.5 4.5  CL 104 109 106  CO2 28 24 26   GLUCOSE 369* 177* 328*  BUN 111* 114* 116*  CREATININE 2.12* 2.27* 1.96*  CALCIUM 10.1 10.1 9.9  MG  --  2.3 2.6*  PHOS  --  3.2 2.8   Estimated Creatinine Clearance: 23.7 mL/min (A) (by C-G formula based on SCr of 1.96 mg/dL (H)). Liver & Pancreas: Recent Labs  Lab 10/18/20 1009 10/19/20 0545  ALBUMIN 3.5 3.4*   No results for input(s): LIPASE, AMYLASE in the last 168 hours. No results for input(s): AMMONIA in the last 168 hours. Diabetic: Recent Labs    10/19/20 0545  HGBA1C 7.9*   Recent Labs  Lab 10/18/20 1519 10/18/20 2339 10/19/20 0352 10/19/20 0753 10/19/20 1143  GLUCAP 194* 310* 301* 214* 220*   Cardiac Enzymes: No results for input(s): CKTOTAL, CKMB, CKMBINDEX, TROPONINI in the last 168 hours. No results for input(s): PROBNP in the last 8760  hours. Coagulation Profile: No results for input(s): INR, PROTIME in the last 168 hours. Thyroid Function Tests: No results for input(s): TSH, T4TOTAL, FREET4, T3FREE, THYROIDAB in the last 72 hours. Lipid Profile: No results for input(s): CHOL, HDL, LDLCALC, TRIG, CHOLHDL, LDLDIRECT in the last 72 hours. Anemia  Panel: No results for input(s): VITAMINB12, FOLATE, FERRITIN, TIBC, IRON, RETICCTPCT in the last 72 hours. Urine analysis:    Component Value Date/Time   COLORURINE YELLOW 10/18/2020 [place] 10/18/2020 1319   LABSPEC 1.013 10/18/2020 1319   PHURINE 6.0 10/18/2020 1319   GLUCOSEU NEGATIVE 10/18/2020 1319   GLUCOSEU 500 (A) 05/12/2009 2130   HGBUR NEGATIVE 10/18/2020 1319   BILIRUBINUR NEGATIVE 10/18/2020 [place] 10/18/2020 1319   PROTEINUR NEGATIVE 10/18/2020 1319   UROBILINOGEN 1.0 01/16/2015 1630   NITRITE NEGATIVE 10/18/2020 1319   LEUKOCYTESUR LARGE (A) 10/18/2020 1319   Sepsis Labs: Invalid input(s): PROCALCITONIN, [place]  Microbiology: Recent Results (from the past 240 hour(s))  Resp Panel by RT-PCR (Flu A&B, Covid) Nasopharyngeal Swab     Status: None   Collection Time: 10/09/20  7:50 PM   Specimen: Nasopharyngeal Swab; Nasopharyngeal(NP) swabs in vial transport medium  Result Value Ref Range Status   SARS Coronavirus 2 by RT PCR NEGATIVE NEGATIVE Final    Comment: (NOTE) SARS-CoV-2 target nucleic acids are NOT DETECTED.  The SARS-CoV-2 RNA is generally detectable in upper respiratory specimens during the acute phase of infection. The lowest concentration of SARS-CoV-2 viral copies this assay can detect is 138 copies/mL. A negative result does not preclude SARS-Cov-2 infection and should not be used as the sole basis for treatment or other patient management decisions. A negative result may occur with  improper specimen collection/handling, submission of specimen other than nasopharyngeal swab, presence of viral  mutation(s) within the areas targeted by this assay, and inadequate number of viral copies(<138 copies/mL). A negative result must be combined with clinical observations, patient history, and epidemiological information. The expected result is Negative.  Fact Sheet for Patients:  EntrepreneurPulse.com.au  Fact Sheet for Healthcare Providers:  IncredibleEmployment.be  This test is no t yet approved or cleared by the [place] FDA and  has been authorized for detection and/or diagnosis of SARS-CoV-2 by FDA under an Emergency Use Authorization (EUA). This EUA will remain  in effect (meaning this test can be used) for the duration of the COVID-19 declaration under Section 564(b)(1) of the Act, 21 U.S.C.section 360bbb-3(b)(1), unless the authorization is terminated  or revoked sooner.       Influenza A by PCR NEGATIVE NEGATIVE Final   Influenza B by PCR NEGATIVE NEGATIVE Final    Comment: (NOTE) The Xpert Xpress SARS-CoV-2/FLU/RSV plus assay is intended as an aid in the diagnosis of influenza from Nasopharyngeal swab specimens and should not be used as a sole basis for treatment. Nasal washings and aspirates are unacceptable for Xpert Xpress SARS-CoV-2/FLU/RSV testing.  Fact Sheet for Patients: EntrepreneurPulse.com.au  Fact Sheet for Healthcare Providers: IncredibleEmployment.be  This test is not yet approved or cleared by the [place] FDA and has been authorized for detection and/or diagnosis of SARS-CoV-2 by FDA under an Emergency Use Authorization (EUA). This EUA will remain in effect (meaning this test can be used) for the duration of the COVID-19 declaration under Section 564(b)(1) of the Act, 21 U.S.C. section 360bbb-3(b)(1), unless the authorization is terminated or revoked.  Performed at [facility], [place] [address] [place], [place] [address]   Urine culture     Status: None    Collection Time: 10/10/20  3:43 AM   Specimen: Urine, Random  Result Value Ref Range Status   Specimen Description URINE, RANDOM  Final   Special Requests NONE  Final   Culture   Final  NO GROWTH Performed at [facility], [place] [address], [place], [place] [address]    Report Status 10/11/2020 FINAL  Final  SARS CORONAVIRUS 2 (TAT 6-24 HRS) Nasopharyngeal Nasopharyngeal Swab     Status: None   Collection Time: 10/17/20 11:21 PM   Specimen: Nasopharyngeal Swab  Result Value Ref Range Status   SARS Coronavirus 2 NEGATIVE NEGATIVE Final    Comment: (NOTE) SARS-CoV-2 target nucleic acids are NOT DETECTED.  The SARS-CoV-2 RNA is generally detectable in upper and lower respiratory specimens during the acute phase of infection. Negative results do not preclude SARS-CoV-2 infection, do not rule out co-infections with other pathogens, and should not be used as the sole basis for treatment or other patient management decisions. Negative results must be combined with clinical observations, patient history, and epidemiological information. The expected result is Negative.  Fact Sheet for Patients: SugarRoll.be  Fact Sheet for Healthcare Providers: https://www.woods-mathews.com/  This test is not yet approved or cleared by the [place] FDA and  has been authorized for detection and/or diagnosis of SARS-CoV-2 by FDA under an Emergency Use Authorization (EUA). This EUA will remain  in effect (meaning this test can be used) for the duration of the COVID-19 declaration under Se ction 564(b)(1) of the Act, 21 U.S.C. section 360bbb-3(b)(1), unless the authorization is terminated or revoked sooner.  Performed at [facility], [place] [address] [place], [place] [address]     Radiology Studies: No results found.  Martia Dalby T. Palmer  If 7PM-7AM, please contact night-coverage [email or web address] 10/19/2020, 2:14 PM

## 2020-10-19 NOTE — TOC Progression Note (Signed)
Transition of Care [facility]) - Progression Note    Patient Details  Name: [name] MRN: [id number] Date of Birth: [birth year]  Transition of Care [facility]) CM/SW Contact  Rika Daughdrill, Juliann Pulse, RN Phone Number: 10/19/2020, 2:01 PM  Clinical Narrative:  PT recc HHPT;[facility] rep Colleen aware-they will provide HHPT @ their [facility];they will also provide transportation home.   Expected Discharge Plan: [place] Barriers to Discharge: Continued Medical Work up  Expected Discharge Plan and Services Expected Discharge Plan: [place]   Discharge Planning Services: CM Consult   Living arrangements for the past 2 months: [place] Expected Discharge Date:  (unknown)                         HH Arranged: PT HH Agency:  ([facility]) Date HH Agency Contacted: 10/19/20 Time HH Agency Contacted: 1401 Representative spoke with at [place]: [place] ([place]) Interventions    Readmission Risk Interventions Readmission Risk Prevention Plan 10/19/2020  Transportation Screening Complete  Medication Review Press photographer) Complete  PCP or Specialist appointment within 3-5 days of discharge Complete  HRI or [place] Complete  SW Recovery Care/Counseling Consult Complete  [place] Not Applicable  Some recent data might be hidden

## 2020-10-19 NOTE — Telephone Encounter (Signed)
Attempted to call patient to advise RRT and apt. Upcoming with Dr. Lovena Le on 11/01/20. No answer, LMOMV.

## 2020-10-20 LAB — COMPREHENSIVE METABOLIC PANEL
ALT: 21 U/L (ref 0–44)
AST: 24 U/L (ref 15–41)
Albumin: 3.1 g/dL — ABNORMAL LOW (ref 3.5–5.0)
Alkaline Phosphatase: 85 U/L (ref 38–126)
Anion gap: 14 (ref 5–15)
BUN: 98 mg/dL — ABNORMAL HIGH (ref 8–23)
CO2: 25 mmol/L (ref 22–32)
Calcium: 9.4 mg/dL (ref 8.9–10.3)
Chloride: 105 mmol/L (ref 98–111)
Creatinine, Ser: 2.28 mg/dL — ABNORMAL HIGH (ref 0.61–1.24)
GFR, Estimated: 27 mL/min — ABNORMAL LOW (ref >60)
Glucose, Bld: 228 mg/dL — ABNORMAL HIGH (ref 70–99)
Potassium: 4.4 mmol/L (ref 3.5–5.1)
Sodium: 144 mmol/L (ref 135–145)
Total Bilirubin: 0.5 mg/dL (ref 0.3–1.2)
Total Protein: 7.1 g/dL (ref 6.5–8.1)

## 2020-10-20 LAB — GLUCOSE, CAPILLARY
Glucose-Capillary: 180 mg/dL — ABNORMAL HIGH (ref 70–99)
Glucose-Capillary: 184 mg/dL — ABNORMAL HIGH (ref 70–99)
Glucose-Capillary: 192 mg/dL — ABNORMAL HIGH (ref 70–99)
Glucose-Capillary: 200 mg/dL — ABNORMAL HIGH (ref 70–99)
Glucose-Capillary: 218 mg/dL — ABNORMAL HIGH (ref 70–99)
Glucose-Capillary: 221 mg/dL — ABNORMAL HIGH (ref 70–99)

## 2020-10-20 LAB — CBC WITH DIFFERENTIAL/PLATELET
Abs Immature Granulocytes: 0.11 10*3/uL — ABNORMAL HIGH (ref 0.00–0.07)
Basophils Absolute: 0.1 10*3/uL (ref 0.0–0.1)
Basophils Relative: 0 %
Eosinophils Absolute: 0.5 10*3/uL (ref 0.0–0.5)
Eosinophils Relative: 4 %
HCT: 39.4 % (ref 39.0–52.0)
Hemoglobin: 12 g/dL — ABNORMAL LOW (ref 13.0–17.0)
Immature Granulocytes: 1 %
Lymphocytes Relative: 12 %
Lymphs Abs: 1.6 10*3/uL (ref 0.7–4.0)
MCH: 30.8 pg (ref 26.0–34.0)
MCHC: 30.5 g/dL (ref 30.0–36.0)
MCV: 101.3 fL — ABNORMAL HIGH (ref 80.0–100.0)
Monocytes Absolute: 1.7 10*3/uL — ABNORMAL HIGH (ref 0.1–1.0)
Monocytes Relative: 12 %
Neutro Abs: 9.6 10*3/uL — ABNORMAL HIGH (ref 1.7–7.7)
Neutrophils Relative %: 71 %
Platelets: 225 10*3/uL (ref 150–400)
RBC: 3.89 MIL/uL — ABNORMAL LOW (ref 4.22–5.81)
RDW: 14.7 % (ref 11.5–15.5)
WBC: 13.5 10*3/uL — ABNORMAL HIGH (ref 4.0–10.5)
nRBC: 0 % (ref 0.0–0.2)

## 2020-10-20 LAB — MAGNESIUM: Magnesium: 2.8 mg/dL — ABNORMAL HIGH (ref 1.7–2.4)

## 2020-10-20 LAB — PHOSPHORUS: Phosphorus: 2.5 mg/dL (ref 2.5–4.6)

## 2020-10-20 MED ORDER — CHLORHEXIDINE GLUCONATE 0.12 % MT SOLN
15.0000 mL | Freq: Two times a day (BID) | OROMUCOSAL | Status: DC
  Administered 2020-10-20 – 2020-10-21 (×2): 15 mL via OROMUCOSAL
  Filled 2020-10-20 (×2): qty 15

## 2020-10-20 MED ORDER — ORAL CARE MOUTH RINSE
15.0000 mL | Freq: Two times a day (BID) | OROMUCOSAL | Status: DC
  Administered 2020-10-20 – 2020-10-21 (×3): 15 mL via OROMUCOSAL

## 2020-10-20 MED ORDER — FUROSEMIDE 20 MG PO TABS
20.0000 mg | ORAL_TABLET | Freq: Every day | ORAL | Status: DC
  Administered 2020-10-21: 20 mg via ORAL
  Filled 2020-10-20 (×2): qty 1

## 2020-10-20 MED ORDER — OSMOLITE 1.2 CAL PO LIQD
1000.0000 mL | ORAL | Status: DC
  Administered 2020-10-20: 1000 mL
  Filled 2020-10-20: qty 1000

## 2020-10-20 NOTE — Progress Notes (Signed)
Pt began vomiting bolus feed immediately after this RN administered. Pt also with copious amount of mucus. Pt suctioned and continuous feed stopped. VSS. MD Samtani made aware. Orders to hold tube feeds until further notice. Will continue to monitor patient.

## 2020-10-20 NOTE — Plan of Care (Signed)

## 2020-10-20 NOTE — Progress Notes (Signed)
PROGRESS NOTE   ROI [name]  YTK:[id number] DOB: 1931-08-26 DOA: 10/17/2020 PCP: Axel Filler, MD  Brief Narrative:  87 year old black male HFrEF status post AICD Carotid disease status post stent DM TY 2 Laryngeal cancer with dysphagia resulting in PEG tube placement 11/27/2019, recurrent hospitalizations for hyponatremia hypoxic respiratory failure deconditioning HTN  Admitted to the hospital 10/19/2019 3 days constipation found to have acute respiratory failure sats in the 80s tachypneic BNP was 970 TTE showed EF 20 to 25% large pericardial effusion Speech therapy saw the patient in consult recommended n.p.o. continue tube feeds   Assessment & Plan:   Principal Problem:   Acute on chronic combined systolic and diastolic CHF (congestive heart failure) (HCC) Active Problems:   Type 2 diabetes mellitus with peripheral vascular disease (HCC)   Malignant neoplasm of glottis (HCC)   [name] (chronic kidney disease) stage 4, GFR 15-29 ml/min (HCC)   Acute respiratory failure with hypoxia ([place])   1. Hypoxic respiratory failure secondary to HFrEF acute component a. Continue to diurese with Lasix IV 20 daily b. Continue Coreg 6.25 twice daily c. Seems euvolemic at this time and likely a little bit volume depleted 2. Laryngeal cancer n.p.o. state PEG feeds a. Large amount of vomit with bolus feeds therefore holding bolus feeds today b. Will advance feeds to 30 cc an hourToday and give free water flushes 120 cc an hour and see if we can resume boluses c. I talked to his son and have mentioned that if this becomes recurrent we may be dealing with a hospice like situation 3. DM TY 2 a. Continue Lantus 8, sliding scale coverage every 4 hourly as cannot eat 4. AKI on admission superimposed on [name] 4 a. Very poor renal function not a great dialysis candidate b. Avoid nephrotoxins as best possible 5. Leukocytosis a. Downward trending but still elevated so given patient has had an  episode of vomiting will get an x-ray in the morning of the chest to ensure he is not aspirating 6. Constipation 7. Hypernatremia  DVT prophylaxis: Lovenox Code Status: Full Family Communication: Discussed with son Disposition:  Status is: Inpatient  Remains inpatient appropriate because:Hemodynamically unstable, Persistent severe electrolyte disturbances and Unsafe d/c plan   Dispo: The patient is from: Independent living              Anticipated d/c is to: SNF              Anticipated d/c date is: 2 days              Patient currently is not medically stable to d/c.   Difficult to place patient No       Consultants:   Palliative care  Procedures: None  Antimicrobials: No   Subjective: Verbalizes fairly but at times is confused Moves 4 limbs equally but quite weak I was present when patient had vomiting episode-he still remains on oxygen  Objective: Vitals:   10/20/20 0421 10/20/20 0500 10/20/20 0945 10/20/20 1223  BP: 123/66  123/69 (!) 108/59  Pulse: 86  96 84  Resp: 18  20   Temp: 98.2 F (36.8 C)  98.1 F (36.7 C)   TempSrc: Oral  Oral   SpO2: 95%  91% 94%  Weight:  62.5 kg    Height:        Intake/Output Summary (Last 24 hours) at 10/20/2020 1305 Last data filed at 10/20/2020 1129 Gross per 24 hour  Intake 632 ml  Output 700 ml  Net -  68 ml   Filed Weights   10/17/20 1802 10/19/20 0500 10/20/20 0500  Weight: 65 kg 63.2 kg 62.5 kg    Examination:  No icterus no pallor frail cachectic black male no distress EOMI NCAT Postop changes to neck S1-S2 no murmur no rub no gallop PEG tube in place Patient has discoloration and darkening of lower extremities and has heel protectors on Neurologically is intact  Data Reviewed: I have personally reviewed following labs and imaging studies  White count down from 16.7-13.5 Hemoglobin 12.7-12.0 Sodium 144 potassium 4.4 BUNs/creatinine 116/1.9-->98/2.2  COVID-19 Labs  No results for input(s): DDIMER,  FERRITIN, LDH, CRP in the last 72 hours.  Lab Results  Component Value Date   [place] NEGATIVE 10/17/2020   [place] NEGATIVE 10/09/2020   [place] NEGATIVE 09/22/2020   [place] NEGATIVE 12/05/2019     Radiology Studies: No results found.   Scheduled Meds: . acetaminophen  500 mg Oral Q8H  . atorvastatin  40 mg Per Tube q1800  . carvedilol  6.25 mg Per Tube BID WC  . chlorhexidine  15 mL Mouth Rinse BID  . enoxaparin (LOVENOX) injection  30 mg Subcutaneous Q24H  . feeding supplement (NEPRO CARB STEADY)  237 mL Per Tube 5 X Daily  . free water  120 mL Per Tube 5 X Daily  . furosemide  20 mg Intravenous Daily  . insulin aspart  0-9 Units Subcutaneous Q4H  . insulin glargine  8 Units Subcutaneous Daily  . mouth rinse  15 mL Mouth Rinse q12n4p  . pantoprazole sodium  40 mg Per Tube Daily  . sodium chloride flush  3 mL Intravenous Q12H  . trimethoprim-polymyxin b  1 drop Both Eyes QID   Continuous Infusions: . sodium chloride    . feeding supplement (OSMOLITE 1.2 CAL) Stopped (10/20/20 0946)     LOS: 2 days    Time spent: [place], MD [facility] To contact the attending provider between 7A-7P or the covering provider during after hours 7P-7A, please log into the web site [email or web address] and access using universal [place] password for that web site. If you do not have the password, please call the hospital operator.  10/20/2020, 1:05 PM

## 2020-10-21 ENCOUNTER — Other Ambulatory Visit: Payer: Self-pay

## 2020-10-21 LAB — COMPREHENSIVE METABOLIC PANEL
ALT: 19 U/L (ref 0–44)
AST: 24 U/L (ref 15–41)
Albumin: 3 g/dL — ABNORMAL LOW (ref 3.5–5.0)
Alkaline Phosphatase: 68 U/L (ref 38–126)
Anion gap: 10 (ref 5–15)
BUN: 106 mg/dL — ABNORMAL HIGH (ref 8–23)
CO2: 27 mmol/L (ref 22–32)
Calcium: 9.5 mg/dL (ref 8.9–10.3)
Chloride: 108 mmol/L (ref 98–111)
Creatinine, Ser: 2.01 mg/dL — ABNORMAL HIGH (ref 0.61–1.24)
GFR, Estimated: 32 mL/min — ABNORMAL LOW (ref >60)
Glucose, Bld: 253 mg/dL — ABNORMAL HIGH (ref 70–99)
Potassium: 4.8 mmol/L (ref 3.5–5.1)
Sodium: 145 mmol/L (ref 135–145)
Total Bilirubin: 0.4 mg/dL (ref 0.3–1.2)
Total Protein: 7.2 g/dL (ref 6.5–8.1)

## 2020-10-21 LAB — GLUCOSE, CAPILLARY
Glucose-Capillary: 213 mg/dL — ABNORMAL HIGH (ref 70–99)
Glucose-Capillary: 216 mg/dL — ABNORMAL HIGH (ref 70–99)
Glucose-Capillary: 232 mg/dL — ABNORMAL HIGH (ref 70–99)
Glucose-Capillary: 248 mg/dL — ABNORMAL HIGH (ref 70–99)

## 2020-10-21 MED ORDER — CARVEDILOL 6.25 MG PO TABS: ORAL_TABLET | ORAL | 1 refills | Status: AC

## 2020-10-21 MED ORDER — FUROSEMIDE 20 MG PO TABS: 20.0000 mg | ORAL_TABLET | Freq: Every day | ORAL | 0 refills | Status: DC

## 2020-10-21 MED ORDER — NEPRO/CARBSTEADY PO LIQD: 120.0000 mL | Freq: Three times a day (TID) | ORAL | 0 refills | Status: AC

## 2020-10-21 MED ORDER — SENNOSIDES-DOCUSATE SODIUM 8.6-50 MG PO TABS: 1.0000 | ORAL_TABLET | Freq: Two times a day (BID) | ORAL | Status: AC | PRN

## 2020-10-21 MED ORDER — FREE WATER: 100.0000 mL | Freq: Four times a day (QID) | Status: AC

## 2020-10-21 MED ORDER — PROCHLORPERAZINE MALEATE 5 MG PO TABS: 5.0000 mg | ORAL_TABLET | Freq: Three times a day (TID) | ORAL | Status: AC | PRN

## 2020-10-21 NOTE — Plan of Care (Signed)
  Problem: Education: Goal: Knowledge of General Education information will improve Description Including pain rating scale, medication(s)/side effects and non-pharmacologic comfort measures Outcome: Progressing   Problem: Health Behavior/Discharge Planning: Goal: Ability to manage health-related needs will improve Outcome: Progressing   Problem: Clinical Measurements: Goal: Will remain free from infection Outcome: Progressing   Problem: Coping: Goal: Level of anxiety will decrease Outcome: Progressing   Problem: Pain Managment: Goal: General experience of comfort will improve Outcome: Progressing   

## 2020-10-21 NOTE — Progress Notes (Signed)
Inpatient Diabetes Program Recommendations  AACE/ADA: New Consensus Statement on Inpatient Glycemic Control (2015)  Target Ranges:  Prepandial:   less than 140 mg/dL      Peak postprandial:   less than 180 mg/dL (1-2 hours)      Critically ill patients:  140 - 180 mg/dL   Lab Results  Component Value Date   GLUCAP 213 (H) 10/21/2020   HGBA1C 7.9 (H) 10/19/2020    Review of Glycemic Control Results for [name], [name] (MRN [id number]) as of 10/21/2020 10:15  Ref. Range 10/20/2020 16:12 10/20/2020 20:18 10/21/2020 00:24 10/21/2020 04:05 10/21/2020 07:38  Glucose-Capillary Latest Ref Range: 70 - 99 mg/dL 200 (H) 192 (H) 216 (H) 232 (H) 213 (H)    Current orders for Inpatient glycemic control:  Novolog 0-9 units Q4H Lantus 8 units daily Osmolite 1.2 at 73ml/hr  Inpatient Diabetes Program Recommendations:    Might consider:  Novolog 3 units tube feed coverage.  Stop is feeds are held or discontinued and CBG < 80 mg/dL.  Will continue to follow while inpatient.  Thank you, Reche Dixon, RN, BSN Diabetes Coordinator Inpatient Diabetes Program [phone] (team pager from 8a-5p)

## 2020-10-21 NOTE — Discharge Summary (Addendum)
Physician Discharge Summary  [name] NLZ:[id number] DOB: [birth year] DOA: 10/17/2020  PCP: Robert Filler, MD  Admit date: 10/17/2020 Discharge date: 10/21/2020  Time spent: 26 minutes  Recommendations for Outpatient Follow-up:  1. Recommend outpatient goals of care at independent living and initiation of therapy services there 2. Needs Chem-12 CBC in about 1 week 3. Recommend chest x-ray in 1 month 4. New medication Lasix 20 mg daily 5. Cut back feeds as bolus to 120 tid--he will need close supervision with regards to feeds as OP and might need hopsice to follow if concerns for aspiration and or failure to thrive persist  Discharge Diagnoses:  Principal Problem:   Acute on chronic combined systolic and diastolic CHF (congestive heart failure) (HCC) Active Problems:   Type 2 diabetes mellitus with peripheral vascular disease ([place])   Malignant neoplasm of glottis (HCC)   [name] (chronic kidney disease) stage 4, GFR 15-29 ml/min (HCC)   Acute respiratory failure with hypoxia ([place])   Discharge Condition: Guarded  Diet recommendation: Only tube feeds n.p.o. otherwise and only continuous feeds  Filed Weights   10/19/20 0500 10/20/20 0500 10/21/20 9024  Weight: 63.2 kg 62.5 kg 62.1 kg    History of present illness:  87 year old black male HFrEF status post AICD Carotid disease status post stent DM TY 2 Laryngeal cancer with dysphagia resulting in PEG tube placement 11/27/2019, recurrent hospitalizations for hyponatremia hypoxic respiratory failure deconditioning HTN  Admitted to the hospital 10/19/2019 3 days constipation found to have acute respiratory failure sats in the 80s tachypneic BNP was 970 TTE showed EF 20 to 25% large pericardial effusion Speech therapy saw the patient in consult recommended n.p.o. continue tube feeds  Hospital Course:   1. Hypoxic respiratory failure secondary to HFrEF acute component a. Started on IV Lasix this admission and transition  to p.o. Lasix 20 daily on discharge b. Continue Coreg 6.25 twice daily c. Seems euvolemic at this time and likely a little bit volume depleted 2. Laryngeal cancer n.p.o. state PEG feeds a. Large amount of vomit with bolus feeds therefore holding bolus feeds at least for the next week and then can challenge with half the amount of feeds "50 cc bolus every 3-4 hours?"  As an outpatient b. Patient was advanced back to 50 cc/H continuous feeds c. His son is aware that if he continues to aspirate patient may continue to have issues 3. DM TY 2 a. Continue Lantus 8, b. Patient can have intermittent sliding scale checks but in an effort to decrease the burden of fingersticks would probably not do this aggressively 4. AKI on admission superimposed on [name] 4 a. Very poor renal function not a great dialysis candidate b. Avoid nephrotoxins as best possible 5. Leukocytosis a. Downward trending with some possible component of aspiration 6. Constipation 7. Hypernatremia     Discharge Exam: Vitals:   10/20/20 2025 10/21/20 0532  BP: 98/82 133/73  Pulse: 80 90  Resp:  20  Temp: 98.6 F (37 C) 98.3 F (36.8 C)  SpO2: 96% (!) 89%    General: Coherent moving all 4 limbs no distress able to sit up minimally Cardiovascular: S1-S2 no murmur no rub no gallop Respiratory: Clear no rales no rhonchi Abdomen soft with PEG tube in place Neurologically intact no focal deficit  Discharge Instructions    Allergies as of 10/21/2020   No Known Allergies     Medication List    STOP taking these medications   Accu-Chek Aviva Plus test strip  Generic drug: glucose blood   aspirin EC 81 MG tablet   bisoprolol 5 MG tablet Commonly known as: ZEBETA     TAKE these medications   Accu-Chek Aviva Plus w/Device Kit Check finger stick glucose once daily   acetaminophen 325 MG tablet Commonly known as: TYLENOL Take 650 mg by mouth every 12 (twelve) hours as needed for mild pain.   atorvastatin 40 MG  tablet Commonly known as: LIPITOR TAKE 1 TABLET(40 MG) BY TUBE DAILY AT 6 PM What changed:   how much to take  how to take this  when to take this  additional instructions   carvedilol 6.25 MG tablet Commonly known as: COREG PLACE 1 TABLET INTO FEEDING TUBE TWICE DAILY WITH A MEAL   feeding supplement (NEPRO CARB STEADY) Liqd Place 120 mLs into feeding tube 3 (three) times daily with meals. What changed:   how much to take  when to take this   feeding supplement (PRO-STAT SUGAR FREE 64) Liqd Place 30 mLs into feeding tube daily.   free water Soln Place 100 mLs into feeding tube every 6 (six) hours.   furosemide 20 MG tablet Commonly known as: LASIX Take 1 tablet (20 mg total) by mouth daily. Start taking on: October 22, 2020   insulin glargine 100 UNIT/ML injection Commonly known as: LANTUS Inject 0.08 mLs (8 Units total) into the skin daily.   Insulin Pen Needle 31G X 8 MM Misc Commonly known as: B-D ULTRAFINE III SHORT PEN USE ONCE DAILY WITH LANTUS PEN   lansoprazole 30 MG disintegrating tablet Commonly known as: PREVACID SOLUTAB Place 30 mg into feeding tube daily.   polyvinyl alcohol 1.4 % ophthalmic solution Commonly known as: LIQUIFILM TEARS Place 1 drop into both eyes 4 (four) times daily as needed for dry eyes.   prochlorperazine 5 MG tablet Commonly known as: COMPAZINE Place 1 tablet (5 mg total) into feeding tube 3 (three) times daily as needed for nausea.   senna-docusate 8.6-50 MG tablet Commonly known as: Senokot-S Take 1 tablet by mouth 2 (two) times daily as needed for moderate constipation.   Ventolin HFA 108 (90 Base) MCG/ACT inhaler Generic drug: albuterol INHALE 2 PUFFS INTO THE LUNGS EVERY 6 HOURS AS NEEDED FOR WHEEZING OR SHORTNESS OF BREATH What changed: See the new instructions.      No Known Allergies    The results of significant diagnostics from this hospitalization (including imaging, microbiology, ancillary and  laboratory) are listed below for reference.    Significant Diagnostic Studies: DG Chest 2 View  Result Date: 10/09/2020 CLINICAL DATA:  Cough and hypoxia. EXAM: CHEST - 2 VIEW COMPARISON:  May 20, 2019 FINDINGS: A multi lead AICD is in place. There is opacification of the retrocardiac region of the left lung base. Mildly increased lung markings are seen within the mid and lower right lung. There is no evidence of a pleural effusion or pneumothorax. The cardiac silhouette is mildly enlarged. A coronary artery stent is noted. Mild calcification of the aortic arch is seen. Degenerative changes are noted throughout the thoracic spine. IMPRESSION: Airspace opacity in the retrocardiac region of the left lung base, suspicious for infiltrate and/or atelectasis. Electronically Signed   By: Aram Candela M.D.   On: 10/09/2020 18:28   IR REPLACE G-TUBE SIMPLE WO FLUORO  Result Date: 10/10/2020 INDICATION: Dislodged gastrostomy tube.  Request replacement. EXAM: REPLACEMENT OF GASTROSTOMY TUBE COMPARISON:  None. MEDICATIONS: None. CONTRAST:  None FLUOROSCOPY TIME:  None COMPLICATIONS: None immediate. PROCEDURE: Informed written  consent was obtained from the patient after a discussion of the risks, benefits and alternatives to treatment. Questions regarding the procedure were encouraged and answered. A timeout was performed prior to the initiation of the procedure. The existing gastrostomy tube site was identified in the left upper quadrant. No evidence of tract closure. A new 35 French balloon retention gastrostomy replacement tube was placed without difficulty. The balloon was inflated with 8 mL normal saline and disc was cinched. A dressing was placed. The patient tolerated the procedure well without immediate postprocedural complication. Separate contrast injection and imaging will be performed to verify tube location. IMPRESSION: Successful replacement of a new 18-French gastrostomy tube. The new gastrostomy  tube is ready for immediate use. Separate contrast injection and imaging will be performed to verify tube location. Read by: Ascencion Dike PA-C Electronically Signed   By: Markus Daft M.D.   On: 10/10/2020 13:10   DG ABDOMEN PEG TUBE LOCATION  Result Date: 10/10/2020 CLINICAL DATA:  Dislodged gastrostomy tube. New 53fr balloon retention placed, confirm intragastric location per order. EXAM: ABDOMEN - 1 VIEW COMPARISON:  None. FINDINGS: Replaced gastrostomy tube projects along the greater curvature of the gastric body. Contrast is seen within the stomach, small bowel, colon and rectum. There is no contrast extravasation. No bowel dilation. IMPRESSION: 1. Well-positioned gastrostomy tube. Electronically Signed   By: Lajean Manes M.D.   On: 10/10/2020 12:07   DG ABDOMEN PEG TUBE LOCATION  Result Date: 10/10/2020 CLINICAL DATA:  Status post insertion of peg tube EXAM: ABDOMEN - 1 VIEW COMPARISON:  October 09, 2020 FINDINGS: Contrast is noted within the stomach and small bowel. There is an apparent enteric tube projecting over the stomach. Contrast is noted within the colon. The bowel gas pattern appears nonobstructive. IMPRESSION: Contrast within the stomach and small bowel. Enteric tube projects over the stomach. Electronically Signed   By: Constance Holster M.D.   On: 10/10/2020 01:09   DG ABDOMEN PEG TUBE LOCATION  Result Date: 10/09/2020 CLINICAL DATA:  Peg tube placement check. EXAM: ABDOMEN - 1 VIEW COMPARISON:  Abdominal radiograph 07/07/2020 FINDINGS: Single frontal radiograph obtained following administration of contrast through the patient's PEG tube. The PEG tube is located over the central abdomen. There is contrast within the stomach lumen. No evidence of extraluminal contrast. Nonobstructive bowel gas pattern. IMPRESSION: Contrast appropriately fills the stomach following injection through the patient's PEG tube. Electronically Signed   By: Audie Pinto M.D.   On: 10/09/2020 15:05   DG  ABD ACUTE 2+V W 1V CHEST  Result Date: 10/17/2020 CLINICAL DATA:  87 year old male with constipation. EXAM: DG ABDOMEN ACUTE WITH 1 VIEW CHEST COMPARISON:  Abdominal radiograph dated 10/10/2020. chest radiograph dated 10/09/2020. FINDINGS: There is cardiomegaly. Faint diffuse interstitial prominence may represent mild edema or atypical pneumonia. No pleural effusion pneumothorax. Coronary vascular stent. Atherosclerotic calcification of the aorta. Left pectoral AICD device. No bowel dilatation or evidence of obstruction. Air is noted throughout the colon. Probable colonic diverticula with diverticulith. Dense stool noted in the rectal vault. A catheter noted with tip over the lower lumbar spine. Degenerative changes of the spine.  No acute osseous pathology. IMPRESSION: 1. Cardiomegaly with mild edema or atypical pneumonia. 2. No bowel obstruction. Electronically Signed   By: Anner Crete M.D.   On: 10/17/2020 18:43   DG ABD ACUTE 2+V W 1V CHEST  Result Date: 09/22/2020 CLINICAL DATA:  Vomiting EXAM: DG ABDOMEN ACUTE WITH 1 VIEW CHEST COMPARISON:  None. FINDINGS: There is left retrocardiac  consolidation and, likely, a small pleural effusion. Right lung is clear. There is no free intraperitoneal air. There is a percutaneous gastrostomy tube projects over the stomach. No dilated small bowel. IMPRESSION: 1. Left retrocardiac consolidation and small pleural effusion. This could indicate pneumonia. 2. No free intraperitoneal air or evidence of small bowel obstruction. Electronically Signed   By: Deatra Robinson M.D.   On: 09/22/2020 03:51   DG Swallowing Func-Speech Pathology  Result Date: 09/28/2020 Please refer to "Notes" tab for Speech Pathology notes.  ECHOCARDIOGRAM COMPLETE  Result Date: 10/18/2020    ECHOCARDIOGRAM REPORT   Patient Name:   [name] Date of Exam: 10/18/2020 Medical Rec #:  [id number]        Height:       71.0 in Accession #:    [id number]       Weight:       143.3 lb Date of  Birth:  1931-06-01        BSA:          1.830 m Patient Age:    89 years         BP:           130/71 mmHg Patient Gender: M                HR:           88 bpm. Exam Location:  Inpatient Procedure: 2D Echo, Cardiac Doppler and Color Doppler Indications:    I50.43 Acute on chronic combined systolic (congestive) and                 diastolic (congestive) heart failure  History:        Patient has prior history of Echocardiogram examinations, most                 recent 03/03/2020. Previous Myocardial Infarction, Pacemaker and                 Defibrillator, TIA, Arrythmias:RBBB; Risk Factors:Diabetes,                 Hypertension and Dyslipidemia. Pericardial Effusion. [name].  Sonographer:    Elmarie Shiley Dance Referring Phys: [id number] JARED M GARDNER IMPRESSIONS  1. Left ventricular ejection fraction, by estimation, is 20 to 25%. The left ventricle has severely decreased function.  2. The left ventricle demonstrates regional wall motion abnormalities (see scoring diagram/findings for description). All mid-to-apical segments and apex appear akinetic.  3. There is mild concentric left ventricular hypertrophy.  4. Left ventricular diastolic parameters are consistent with Grade II diastolic dysfunction (pseudonormalization).  5. Elevated left atrial pressure. The E/e' is 23.  6. Right ventricular systolic function is normal. The right ventricular size is normal.  7. Left atrial size was moderately dilated.  8. Large pericardial effusion. There is no evidence of cardiac tamponade. IVC is collapsible.  9. The mitral valve leaflets are moderately thickened. Mild mitral valve regurgitation. 10. Suspect at least moderate-to-severe low flow, low gradient aortic stenosis with AVA by continuity 0.6cm2, mean gradient , peak gradient , Vmax 2.25, DI 0.21. Very low stroke volume index at 17.2, however, dimensionless index of 0.21 is concerning for severe low flow, low gradient AS. Previous DI on 02/2020 was 0.4 with AVA 1.48cm2. 11.  The aortic valve is tricuspid. There is severe calcifcation of the aortic valve. There is severe thickening of the aortic valve. Aortic valve regurgitation is trivial. 12. The inferior vena cava is normal in size with greater than  50% respiratory variability, suggesting right atrial pressure of 3 mmHg. Comparison(s): Compared to prior TTE on 02/2020, the LVEF is now 20-25% with suspected severe low flow, low gradient aortic stenosis. FINDINGS  Left Ventricle: Left ventricular ejection fraction, by estimation, is 20 to 25%. The left ventricle has severely decreased function. The left ventricle demonstrates regional wall motion abnormalities. The left ventricular internal cavity size was normal  in size. There is mild concentric left ventricular hypertrophy. Left ventricular diastolic parameters are consistent with Grade II diastolic dysfunction (pseudonormalization). Elevated left atrial pressure. The E/e' is 36.  LV Wall Scoring: The mid and distal anterior wall, mid and distal lateral wall, mid and distal anterior septum, entire apex, mid and distal inferior wall, mid anterolateral segment, and mid inferoseptal segment are akinetic. All mid-to-apical segments and apex appear akinetic. Right Ventricle: The right ventricular size is normal. No increase in right ventricular wall thickness. Right ventricular systolic function is normal. Left Atrium: Left atrial size was moderately dilated. Right Atrium: Right atrial size was normal in size. Pericardium: A large circumferential pericardial effusion is present. The effusion is greatest behind the posterolateral portion of the LV and measures 3.5cm at end diastole. A large pericardial effusion is present. There is no evidence of cardiac tamponade. Mitral Valve: The mitral valve is abnormal. There is moderate thickening of the mitral valve leaflet(s). There is mild calcification of the mitral valve leaflet(s). Mild mitral valve regurgitation. Tricuspid Valve: The tricuspid  valve is normal in structure. Tricuspid valve regurgitation is trivial. Aortic Valve: Suspect at least moderate-to-severe low flow, low gradient aortic stenosis with AVA by continuity 0.6cm2, mean gradient 23mmHg, peak gradient 85mmHg, Vmax 2.25, DI 0.21. Very low stroke volume index at 17.2, however, dimensionless index of 0.21 is concerning for severe low flow, low gradient AS. Previous DI on 02/2020 was 0.4 with AVA 1.48cm2. The aortic valve is tricuspid. There is severe calcifcation of the aortic valve. There is severe thickening of the aortic valve. Aortic valve regurgitation is trivial. Pulmonic Valve: The pulmonic valve was normal in structure. Pulmonic valve regurgitation is trivial. Aorta: The aortic root and ascending aorta are structurally normal, with no evidence of dilitation. Venous: The inferior vena cava is normal in size with greater than 50% respiratory variability, suggesting right atrial pressure of 3 mmHg. IAS/Shunts: No atrial level shunt detected by color flow Doppler.  LEFT VENTRICLE PLAX 2D LVIDd:         5.20 cm  Diastology LVIDs:         4.10 cm  LV e' medial:    4.03 cm/s LV PW:         1.10 cm  LV E/e' medial:  26.8 LV IVS:        1.00 cm  LV e' lateral:   5.33 cm/s LVOT diam:     1.90 cm  LV E/e' lateral: 20.3 LV SV:         33 LV SV Index:   18 LVOT Area:     2.84 cm  RIGHT VENTRICLE RV Basal diam:  2.80 cm RV S prime:     12.80 cm/s TAPSE (M-mode): 2.0 cm LEFT ATRIUM             Index       RIGHT ATRIUM          Index LA diam:        3.80 cm 2.08 cm/m  RA Area:     8.22 cm LA Vol (A2C):  95.1 ml 51.97 ml/m RA Volume:   14.20 ml 7.76 ml/m LA Vol (A4C):   65.6 ml 35.85 ml/m LA Biplane Vol: 81.6 ml 44.59 ml/m  AORTIC VALVE AV Area (Vmax):    0.71 cm AV Area (Vmean):   0.63 cm AV Area (VTI):     0.66 cm AV Vmax:           216.33 cm/s AV Vmean:          159.000 cm/s AV VTI:            0.493 m AV Peak Grad:      18.7 mmHg AV Mean Grad:      11.3 mmHg LVOT Vmax:         54.50 cm/s  LVOT Vmean:        35.500 cm/s LVOT VTI:          0.115 m LVOT/AV VTI ratio: 0.23  AORTA Ao Root diam: 2.90 cm Ao Asc diam:  3.30 cm MITRAL VALVE MV Area (PHT): 4.49 cm     SHUNTS MV Decel Time: 169 msec     Systemic VTI:  0.12 m MV E velocity: 108.00 cm/s  Systemic Diam: 1.90 cm MV A velocity: 90.70 cm/s MV E/A ratio:  1.19 Gwyndolyn Kaufman MD Electronically signed by Gwyndolyn Kaufman MD Signature Date/Time: 10/18/2020/3:47:15 PM    Final    DG Hip Unilat With Pelvis 2-3 Views Left  Result Date: 10/14/2020 CLINICAL DATA:  Left hip pain EXAM: DG HIP (WITH OR WITHOUT PELVIS) 2-3V LEFT COMPARISON:  X-ray 09/22/2020 FINDINGS: No evidence of acute fracture. No dislocation. Moderate degenerative changes of the bilateral hips. Mild osseous demineralization. Prominent vascular calcifications. There is oral contrast within the visualized bowel and extending into numerous sigmoid diverticula. IMPRESSION: 1. No acute osseous abnormality. 2. Moderate degenerative changes of the bilateral hips. Electronically Signed   By: Davina Poke D.O.   On: 10/14/2020 12:18   US Abdomen Limited RUQ (LIVER/GB)  Result Date: 09/22/2020 CLINICAL DATA:  87 year old male with vomiting. EXAM: ULTRASOUND ABDOMEN LIMITED RIGHT UPPER QUADRANT COMPARISON:  CT Abdomen and Pelvis 02/11/2020. FINDINGS: Gallbladder: Chronic cholelithiasis. Stones individually estimated up to 17 mm. Partially contracted gallbladder. Gallbladder wall thickness at the upper limits of normal. No pericholecystic fluid. No sonographic Murphy sign elicited. Common bile duct: Diameter: 5-6 mm, normal. Liver: No focal lesion identified. Within normal limits in parenchymal echogenicity. Portal vein is patent on color Doppler imaging with normal direction of blood flow towards the liver. Other: Negative visible right kidney, pancreas. IMPRESSION: Chronic cholelithiasis. No strong evidence of acute cholecystitis or bile duct obstruction. Electronically Signed   By: Genevie Ann M.D.   On: 09/22/2020 04:24    Microbiology: Recent Results (from the past 240 hour(s))  SARS CORONAVIRUS 2 (TAT 6-24 HRS) Nasopharyngeal Nasopharyngeal Swab     Status: None   Collection Time: 10/17/20 11:21 PM   Specimen: Nasopharyngeal Swab  Result Value Ref Range Status   SARS Coronavirus 2 NEGATIVE NEGATIVE Final    Comment: (NOTE) SARS-CoV-2 target nucleic acids are NOT DETECTED.  The SARS-CoV-2 RNA is generally detectable in upper and lower respiratory specimens during the acute phase of infection. Negative results do not preclude SARS-CoV-2 infection, do not rule out co-infections with other pathogens, and should not be used as the sole basis for treatment or other patient management decisions. Negative results must be combined with clinical observations, patient history, and epidemiological information. The expected result is Negative.  Fact Sheet for  Patients: SugarRoll.be  Fact Sheet for Healthcare Providers: https://www.woods-mathews.com/  This test is not yet approved or cleared by the [place] FDA and  has been authorized for detection and/or diagnosis of SARS-CoV-2 by FDA under an Emergency Use Authorization (EUA). This EUA will remain  in effect (meaning this test can be used) for the duration of the COVID-19 declaration under Se ction 564(b)(1) of the Act, 21 U.S.C. section 360bbb-3(b)(1), unless the authorization is terminated or revoked sooner.  Performed at [facility], [place] [address] [place], [place] [address]      Labs: Basic Metabolic Panel: Recent Labs  Lab 10/17/20 1808 10/18/20 1009 10/19/20 0545 10/20/20 0549 10/21/20 0528  NA 143 148* 144 144 145  K 4.9 4.5 4.5 4.4 4.8  CL 104 109 106 105 108  CO2 $Re'28 24 26 25 27  'oAN$ GLUCOSE 369* 177* 328* 228* 253*  BUN 111* 114* 116* 98* 106*  CREATININE 2.12* 2.27* 1.96* 2.28* 2.01*  CALCIUM 10.1 10.1 9.9 9.4 9.5  MG  --  2.3 2.6* 2.8*  --    PHOS  --  3.2 2.8 2.5  --    Liver Function Tests: Recent Labs  Lab 10/18/20 1009 10/19/20 0545 10/20/20 0549 10/21/20 0528  AST  --   --  24 24  ALT  --   --  21 19  ALKPHOS  --   --  85 68  BILITOT  --   --  0.5 0.4  PROT  --   --  7.1 7.2  ALBUMIN 3.5 3.4* 3.1* 3.0*   No results for input(s): LIPASE, AMYLASE in the last 168 hours. No results for input(s): AMMONIA in the last 168 hours. CBC: Recent Labs  Lab 10/17/20 1808 10/18/20 0808 10/19/20 0545 10/20/20 0549  WBC 10.1 15.7* 16.7* 13.5*  NEUTROABS 6.8 11.6*  --  9.6*  HGB 12.6* 13.0 12.7* 12.0*  HCT 40.6 41.3 42.2 39.4  MCV 98.3 98.6 101.2* 101.3*  PLT 225 198 264 225   Cardiac Enzymes: No results for input(s): CKTOTAL, CKMB, CKMBINDEX, TROPONINI in the last 168 hours. BNP: BNP (last 3 results) Recent Labs    09/22/20 0303 10/09/20 2009 10/17/20 1808  BNP 413.3* 671.0* 970.3*    ProBNP (last 3 results) No results for input(s): PROBNP in the last 8760 hours.  CBG: Recent Labs  Lab 10/20/20 1612 10/20/20 2018 10/21/20 0024 10/21/20 0405 10/21/20 0738  GLUCAP 200* 192* 216* 232* 213*       Signed:  Nita Sells MD   [facility] 10/21/2020, 10:10 AM

## 2020-10-21 NOTE — Care Management Important Message (Signed)
Important Message  Patient Details IM Letter placed in Patient's room. Name: [name] MRN: [id number] Date of Birth: 19-Feb-1931   Medicare Important Message Given:  Yes     Kerin Salen 10/21/2020, 2:07 PM

## 2020-10-21 NOTE — TOC Transition Note (Signed)
Transition of Care [facility]) - CM/SW Discharge Note   Patient Details  Name: [name] MRN: [id number] Date of Birth: 1931/04/13  Transition of Care [facility]) CM/SW Contact:  Dessa Phi, RN Phone Number: 10/21/2020, 10:39 AM   Clinical Narrative: Patient for d/c back to [facility] with HHPT provided by [facility] following aware of d/c plans-otpt GOC,HHPT, PEG TF( established this) spoke to son [place], & rep [place] about d/c plans-[facility] will provide own [place] Bake in agreement with plan-he is at the home awaiting for patient to arrive. No further CM needs.    Final next level of care: [place] Barriers to Discharge: No Barriers Identified   Patient Goals and CMS Choice Patient states their goals for this hospitalization and ongoing recovery are:: return back [place] [facility] CMS Medicare.gov Compare Post Acute Care list provided to:: Patient Represenative (must comment) Choice offered to / list presented to : Adult Children  Discharge Placement                       Discharge Plan and Services   Discharge Planning Services: CM Consult                      HH Arranged: PT HH Agency: Other - See comment Date HH Agency Contacted: 10/19/20 Time [place]: 1401 Representative spoke with at [place]: [place]) Interventions     Readmission Risk Interventions Readmission Risk Prevention Plan 10/19/2020  Transportation Screening Complete  Medication Review Press photographer) Complete  PCP or Specialist appointment within 3-5 days of discharge Complete  HRI or [place] Complete  SW Recovery Care/Counseling Consult Complete  [place] Not Applicable  Some recent data might be hidden

## 2020-10-21 NOTE — Progress Notes (Signed)
Physical Therapy Treatment Patient Details Name: [name] MRN: [id number] DOB: [birth year] Today's Date: 10/21/2020    History of Present Illness Pt is an 87 year old man admitted on 10/17/20 for acute respiratory failure with hypoxemia in the setting of acute on chronic combined CHF, and constipation. PMH: CHF with EF 25-30%, ICD, HTN, [name] 3, carotid stent, throat ca, G-tube, DM2, MI, PVD, percardial effusion, TIA.    PT Comments    Took some sternal rubs and tapping to get patient to arose and ready to participate. Once finally awake was compliant with PT and ambulated with rollator in the hallway  50 feet with 1 rest break, supervision for safety cues and minA for bed mobility.  Pt did not open his eyes a lot and was very difficult to understand due to "gurggly" sounds in his throat when trying to speak. With my encouragement , he did try to clear his throat several times during our session with very little help in the understanding of the patient. He did spit up 2 large bolus of mucus? Creamy? substance at the beginning of the session.  Pt states he is weak and not quite himself, stating, " yall need to feed me" referring to his peg tube feedings. I explained at the moment per nurse that he is getting slow continuous feedings due to regurgitation when trying to receive a bolus feeding the other day. Pt is doing better and unclear of his mobility at baseline, may be near that, however does have other things he will need to manage as well. Will continue to follow while here on acute care.    Follow Up Recommendations  Home health PT;Supervision for mobility/OOB     Equipment Recommendations  None recommended by PT (pt states he has a rollator he uses)    Recommendations for Other Services       Precautions / Restrictions Precautions Precautions: Fall Precaution Comments: G-tube Restrictions Weight Bearing Restrictions: No    Mobility  Bed Mobility Overal bed mobility: Needs  Assistance Bed Mobility: Supine to Sit     Supine to sit: Min assist     General bed mobility comments: use of rails and my assistance to go from supine ( with hed of bed elevated) to sitting EOB. He was aware that he was a bit dizzy and asks to wait at edge of bed a bit. This was good safety awareness.  Transfers Overall transfer level: Needs assistance Equipment used: 4-wheeled walker Transfers: Sit to/from Stand Sit to Stand: Min guard Stand pivot transfers: Min guard       General transfer comment: min guard and safety cues for locking brakes on rollator ( which he stated he uses at his apartment)  Ambulation/Gait Ambulation/Gait assistance: Supervision Gait Distance (Feet): 50 Feet (had to take resting break on rollator seat in hallway, then walked back again 50 feet with supervision.) Assistive device: 4-wheeled walker       General Gait Details: fairly steady wit rollator in straight hallway, just fatigued easily and even patient mentioned he was "weak, I need to eat!!" He continued to say this.   Stairs             Wheelchair Mobility    Modified Rankin (Stroke Patients Only)       Balance Overall balance assessment: Needs assistance Sitting-balance support: No upper extremity supported Sitting balance-Leahy Scale: Good     Standing balance support: Bilateral upper extremity supported;During functional activity Standing balance-Leahy Scale: Fair Standing balance  comment: reliant on UE to support himself                            Cognition Arousal/Alertness: Awake/alert Behavior During Therapy: WFL for tasks assessed/performed Overall Cognitive Status: Within Functional Limits for tasks assessed                                 General Comments: difficult to arise in the beginning, doesn't open his eyes much, but during session began to talk more, however VERY diffuclt to understand due to "gurggly" in upper throat area.  Encouraged to "clear his throat" many times during the session, and it helped a little.      Exercises      General Comments        Pertinent Vitals/Pain Pain Assessment: No/denies pain    Home Living                      Prior Function            PT Goals (current goals can now be found in the care plan section) Acute Rehab PT Goals Patient Stated Goal: to go home Time For Goal Achievement: 13-Nov-2020 Potential to Achieve Goals: Good Progress towards PT goals: Progressing toward goals    Frequency    Min 3X/week      PT Plan Current plan remains appropriate    Co-evaluation              AM-PAC PT "6 Clicks" Mobility   Outcome Measure  Help needed turning from your back to your side while in a flat bed without using bedrails?: A Little Help needed moving from lying on your back to sitting on the side of a flat bed without using bedrails?: A Little Help needed moving to and from a bed to a chair (including a wheelchair)?: A Little Help needed standing up from a chair using your arms (e.g., wheelchair or bedside chair)?: A Little Help needed to walk in hospital room?: A Little Help needed climbing 3-5 steps with a railing? : A Lot 6 Click Score: 17    End of Session Equipment Utilized During Treatment: Gait belt Activity Tolerance: Patient tolerated treatment well Patient left: in bed;with call bell/phone within reach;with bed alarm set Nurse Communication: Mobility status PT Visit Diagnosis: Unsteadiness on feet (R26.81);Muscle weakness (generalized) (M62.81);Difficulty in walking, not elsewhere classified (R26.2)     Time: 1500-1520 PT Time Calculation (min) (ACUTE ONLY): 20 min  Charges:  $Gait Training: 8-22 mins                     Makena Murdock, PT, MPT Acute Rehabilitation Services Office: [phone] Pager: [phone] 10/21/2020    Clide Dales 10/21/2020, 3:35 PM

## 2020-10-21 NOTE — Progress Notes (Signed)
Occupational Therapy Treatment Patient Details Name: [name] MRN: [id number] DOB: 1930-11-28 Today's Date: 10/21/2020    History of present illness Pt is an 87 year old man admitted on 10/17/20 for acute respiratory failure with hypoxemia in the setting of acute on chronic combined CHF, and constipation. PMH: CHF with EF 25-30%, ICD, HTN, [name] 3, carotid stent, throat ca, G-tube, DM2, MI, PVD, percardial effusion, TIA.   OT comments  Treatment focused on self care tasks today. Patient found on 1 L [place] supine in bed with o2 sat 97%. Therapist removed [place] and monitored o2 sats. Patient min assist to transfer to edge of bed and min guard to pivot to [facility] - patient reporting urgency so unable to ambulate to bathroom for task. Patient seated for toileting. Patient performed grooming tasks on [facility] as well. Patient transferred to recliner with min guard assist - using two hand holds on bed rail and chair to transfer. Therapist assisted with managing gtube line. Patient reports fatigue after limited activity. Patient's o2 sats maintained 91% or above during treatment on RA. Patient required  Verbal cues to cough and work on expectoration when voice sounding "watery." Recommend 24/7 supervision at discharge as patient continuing to need min guard assistance and fatigues with limited activity.   Follow Up Recommendations  Home health OT;Supervision/Assistance - 24 hour    Equipment Recommendations  None recommended by OT    Recommendations for Other Services      Precautions / Restrictions Precautions Precautions: Fall Precaution Comments: G-tube Restrictions Weight Bearing Restrictions: No       Mobility Bed Mobility Overal bed mobility: Needs Assistance Bed Mobility: Supine to Sit     Supine to sit: Min assist;HOB elevated     General bed mobility comments: Min assist for hand hold to transfer to edge of bed.  Transfers Overall transfer level: Needs assistance Equipment used:  Rolling walker (2 wheeled);None Transfers: Sit to/from [employer] to Stand: Min guard Stand pivot transfers: Min guard       General transfer comment: min guard for safety    Balance Overall balance assessment: Needs assistance Sitting-balance support: No upper extremity supported Sitting balance-Leahy Scale: Good     Standing balance support: Bilateral upper extremity supported;During functional activity Standing balance-Leahy Scale: Poor Standing balance comment: reliant on UE to support himself                           ADL either performed or assessed with clinical judgement   ADL Overall ADL's : Needs assistance/impaired     Grooming: Wash/dry hands;Wash/dry face;Oral care;Sitting Grooming Details (indicate cue type and reason): Patient performed grooming task seated on [facility]                 Toilet Transfer: Min Lobbyist Details (indicate cue type and reason): min guard for transfer to [facility]. From [employer] patient stood and transferred to [place]. Reliant on two hand holds for transfer. Toileting- Water quality scientist and Hygiene: Min guard;Sit to/from stand               Vision Patient Visual Report: No change from baseline     Perception     Praxis      Cognition Arousal/Alertness: Awake/alert Behavior During Therapy: WFL for tasks assessed/performed Overall Cognitive Status: Within Functional Limits for tasks assessed  General Comments: HOH        Exercises     Shoulder Instructions       General Comments      Pertinent Vitals/ Pain       Pain Assessment: No/denies pain  Home Living                                          Prior Functioning/Environment              Frequency  Min 2X/week        Progress Toward Goals  OT Goals(current goals can now be found in the care plan section)  Progress  towards OT goals: Progressing toward goals  Acute Rehab OT Goals Patient Stated Goal: to go home OT Goal Formulation: With patient Time For Goal Achievement: 2020/11/22 Potential to Achieve Goals: [place] Discharge plan remains appropriate    Co-evaluation                 AM-PAC OT "6 Clicks" Daily Activity     Outcome Measure   Help from another person eating meals?: Total (NPO) Help from another person taking care of personal grooming?: A Little Help from another person toileting, which includes using toliet, bedpan, or urinal?: A Little Help from another person bathing (including washing, rinsing, drying)?: A Little Help from another person to put on and taking off regular upper body clothing?: A Little Help from another person to put on and taking off regular lower body clothing?: A Little 6 Click Score: 16    End of Session Equipment Utilized During Treatment: Rolling walker  OT Visit Diagnosis: Unsteadiness on feet (R26.81);Other abnormalities of gait and mobility (R26.89);Muscle weakness (generalized) (M62.81)   Activity Tolerance Patient tolerated treatment well   Patient Left in chair;with call bell/phone within reach;with chair alarm set   Nurse Communication Mobility status        Time: 3536-1443 OT Time Calculation (min): 16 min  Charges: OT General Charges $OT Visit: 1 Visit OT Treatments $Self Care/Home Management : 8-22 mins  Derl Barrow, OTR/L [place]  Office [phone] Pager: [place] 10/21/2020, 12:21 PM

## 2020-10-22 ENCOUNTER — Telehealth: Payer: Self-pay | Admitting: *Deleted

## 2020-10-22 NOTE — Telephone Encounter (Signed)
Transition Care Management Follow-up Telephone Call  Date of discharge and from where: 10/20/2020 - [facility]  How have you been since you were released from the hospital? "I am okay."  Any questions or concerns? No  Items Reviewed:  Did the pt receive and understand the discharge instructions provided? Yes   Medications obtained and verified? Yes   Other? No   Any new allergies since your discharge? No   Dietary orders reviewed? Yes  Do you have support at home? Yes   Home Care and Equipment/Supplies: Were home health services ordered? not applicable If so, what is the name of the agency? N/A  Has the agency set up a time to come to the patient's home? not applicable Were any new equipment or medical supplies ordered?  No What is the name of the medical supply agency? N/A Were you able to get the supplies/equipment? not applicable Do you have any questions related to the use of the equipment or supplies? No  Functional Questionnaire: (I = Independent and D = Dependent) ADLs: I  Bathing/Dressing- I  Meal Prep- I  Eating- I  Maintaining continence- I  Transferring/Ambulation- I  Managing Meds- D  Follow up appointments reviewed:   PCP Hospital f/u appt confirmed? No  - Patient will call to make appointment if necessary.   [facility] f/u appt confirmed? Yes  Scheduled to see Cardiology on 11/01/2020 @ 1045.  Are transportation arrangements needed? No   If their condition worsens, is the pt aware to call PCP or go to the Emergency Dept.? Yes  Was the patient provided with contact information for the PCP's office or ED? Yes  Was to pt encouraged to call back with questions or concerns? Yes

## 2020-10-22 NOTE — Telephone Encounter (Signed)
Certified letter sent 

## 2020-10-26 NOTE — Telephone Encounter (Addendum)
Left detailed message on patient's mobile voicemail in regards to patient's device reached RRT on 10/05/2020 and to remind patient that he has an appointment scheduled with dr. Lovena Le on 11/01/2020 at 10:45 am.

## 2020-10-26 NOTE — Telephone Encounter (Signed)
Left message to contact [facility] at [phone].

## 2020-10-27 ENCOUNTER — Ambulatory Visit (INDEPENDENT_AMBULATORY_CARE_PROVIDER_SITE_OTHER): Payer: Medicare (Managed Care)

## 2020-10-27 DIAGNOSIS — Z95810 Presence of automatic (implantable) cardiac defibrillator: Secondary | ICD-10-CM

## 2020-10-27 DIAGNOSIS — I5022 Chronic systolic (congestive) heart failure: Secondary | ICD-10-CM

## 2020-10-27 NOTE — Progress Notes (Signed)
EPIC Encounter for ICM Monitoring  Patient Name: [name] is a 87 y.o. male Date: 10/27/2020 Primary Care Physican: Robert Filler, MD Primary Cardiologist:[name] Electrophysiologist: [name] Pacing: 99.6% LastWeight:140lbs  Battery replacement reached 10/05/2020.    Attempted call to patient and spoke with [name] per DPR due to patient was busy.  She said he was doing okay at this time.  Patient hard of hearing and unable to hear questions from care giver friend.  Patient has had 3 ED visits with 2 hospital admissions in last month.  OptivolThoracic impedancesuggesting possible fluid accumulation since 08/30/2020 but starting to trend back to baseline.   Prescribed:Furosemide20 mgPlace 1 tablet (20 mg total) by mouth daily.  Labs: 02/10/2020 Creatinine 2.19, BUN 101, Potassium 4.4, Sodium 138, GFR 26-30 12/08/2019 Creatinine 1.85, BUN 46, Potassium 4.9, Sodium 135, GFR 32-37 12/07/2019 Creatinine 1.76, BUN 45, Potassium 5.8, Sodium 136, GFR 34-40 12/06/2019 Creatinine 1.71, BUN 39, Potassium 5.8, Sodium 135, GFR 35-41 A complete set of results can be found in Results Review.  Recommendations: Patient has OV with Dr [name] on 11/01/2020  Follow-up plan: ICM clinic phone appointment on2/14/2022 to recheck fluid levels. 91 day device clinic remote transmission3/03/2021.  EP/Cardiology Office Visits:2/7/2022with Dr. Lovena [name].   Copy of ICM check sent to Dr.Taylor.  3 month ICM trend: 10/26/2020.    1 Year ICM trend:       Robert Billings, RN 10/27/2020 12:57 PM

## 2020-10-29 ENCOUNTER — Other Ambulatory Visit: Payer: Self-pay

## 2020-10-29 DIAGNOSIS — I739 Peripheral vascular disease, unspecified: Secondary | ICD-10-CM

## 2020-11-01 ENCOUNTER — Other Ambulatory Visit: Payer: Self-pay

## 2020-11-01 ENCOUNTER — Encounter: Payer: Self-pay | Admitting: Internal Medicine

## 2020-11-01 ENCOUNTER — Telehealth: Payer: Self-pay | Admitting: Emergency Medicine

## 2020-11-01 ENCOUNTER — Ambulatory Visit (INDEPENDENT_AMBULATORY_CARE_PROVIDER_SITE_OTHER): Payer: Medicare (Managed Care) | Admitting: Internal Medicine

## 2020-11-01 DIAGNOSIS — I5022 Chronic systolic (congestive) heart failure: Secondary | ICD-10-CM | POA: Diagnosis not present

## 2020-11-01 DIAGNOSIS — I472 Ventricular tachycardia, unspecified: Secondary | ICD-10-CM

## 2020-11-01 MED ORDER — FUROSEMIDE 40 MG PO TABS: 40.0000 mg | ORAL_TABLET | Freq: Every day | ORAL | 3 refills | Status: AC

## 2020-11-01 NOTE — Telephone Encounter (Signed)
Patient seen by Dr. Lovena Le in office today 11/01/2020. Patient's device has met RRT as of 10/05/2020. Per Dr. Tanna Furry note on 11/01/2020 RRT was discussed and patient is not a candidate for reimplant due to risk/benifits of probable infection.

## 2020-11-01 NOTE — Patient Instructions (Signed)
Medication Instructions:  INCREASE YOUR LASIX TO 40 MG A DAY *If you need a refill on your cardiac medications before your next appointment, please call your pharmacy*   Lab Work: NONE If you have labs (blood work) drawn today and your tests are completely normal, you will receive your results only by: Marland Kitchen MyChart Message (if you have MyChart) OR . A paper copy in the mail If you have any lab test that is abnormal or we need to change your treatment, we will call you to review the results.   Testing/Procedures: NONE   Follow-Up: At [facility], you and your health needs are our priority.  As part of our continuing mission to provide you with exceptional heart care, we have created designated Provider Care Teams.  These Care Teams include your primary Cardiologist (physician) and Advanced Practice Providers (APPs -  Physician Assistants and Nurse Practitioners) who all work together to provide you with the care you need, when you need it.  We recommend signing up for the patient portal called "MyChart".  Sign up information is provided on this After Visit Summary.  MyChart is used to connect with patients for Virtual Visits (Telemedicine).  Patients are able to view lab/test results, encounter notes, upcoming appointments, etc.  Non-urgent messages can be sent to your provider as well.   To learn more about what you can do with MyChart, go to [email or web address].    Your next appointment:   6 month(s)  The format for your next appointment:   In Person  Provider:   Cristopher Peru, MD   Other Instructions

## 2020-11-01 NOTE — Telephone Encounter (Signed)
See Dr. Tanna Furry notes for reference in regards to RRT consult visit on 11/01/2020

## 2020-11-01 NOTE — Progress Notes (Signed)
HPI [name] returns today for followup. He is a pleasant chronically ill appearing man with end stage ICM, VF, CHB, stroke with G-tube, stage 4 renal failure, and COPD. He is at the very end of his life with less than a few months and probably weeks to live. He is incontinent. He has class 4 CHF. He is essentially non-verbal from his old strokes though I think he understands.  No Known Allergies   Current Outpatient Medications  Medication Sig Dispense Refill  . acetaminophen (TYLENOL) 325 MG tablet Take 650 mg by mouth every 12 (twelve) hours as needed for mild pain.    . Amino Acids-Protein Hydrolys (FEEDING SUPPLEMENT, PRO-STAT SUGAR FREE 64,) LIQD Place 30 mLs into feeding tube daily. 887 mL 0  . atorvastatin (LIPITOR) 40 MG tablet TAKE 1 TABLET(40 MG) BY TUBE DAILY AT 6 PM (Patient taking differently: Take 40 mg by mouth daily.) 90 tablet 3  . Blood Glucose Monitoring Suppl (ACCU-CHEK AVIVA PLUS) w/Device KIT Check finger stick glucose once daily 1 kit 0  . carvedilol (COREG) 6.25 MG tablet PLACE 1 TABLET INTO FEEDING TUBE TWICE DAILY WITH A MEAL 180 tablet 1  . furosemide (LASIX) 20 MG tablet Take 1 tablet (20 mg total) by mouth daily. 30 tablet 0  . insulin glargine (LANTUS) 100 UNIT/ML injection Inject 0.08 mLs (8 Units total) into the skin daily. 10 mL 11  . Insulin Pen Needle (B-D ULTRAFINE III SHORT PEN) 31G X 8 MM MISC USE ONCE DAILY WITH LANTUS PEN 100 each 3  . lansoprazole (PREVACID SOLUTAB) 30 MG disintegrating tablet Place 30 mg into feeding tube daily.    . Nutritional Supplements (FEEDING SUPPLEMENT, NEPRO CARB STEADY,) LIQD Place 120 mLs into feeding tube 3 (three) times daily with meals.  0  . polyvinyl alcohol (LIQUIFILM TEARS) 1.4 % ophthalmic solution Place 1 drop into both eyes 4 (four) times daily as needed for dry eyes.    Marland Kitchen prochlorperazine (COMPAZINE) 5 MG tablet Place 1 tablet (5 mg total) into feeding tube 3 (three) times daily as needed for nausea. 30  tablet   . senna-docusate (SENOKOT-S) 8.6-50 MG tablet Take 1 tablet by mouth 2 (two) times daily as needed for moderate constipation.    . VENTOLIN HFA 108 (90 Base) MCG/ACT inhaler INHALE 2 PUFFS INTO THE LUNGS EVERY 6 HOURS AS NEEDED FOR WHEEZING OR SHORTNESS OF BREATH (Patient taking differently: Inhale 2 puffs into the lungs every 6 (six) hours as needed for wheezing or shortness of breath.) 18 g 5  . Water For Irrigation, Sterile (FREE WATER) SOLN Place 100 mLs into feeding tube every 6 (six) hours.     No current facility-administered medications for this visit.     Past Medical History:  Diagnosis Date  . Adenomatous colon polyp 02/14/2012  . AICD (automatic cardioverter/defibrillator) present    Medtronic   . BBB (bundle branch block)    right  . Carotid stenosis    a. s/p L carotid stent 2004;  b. Carotid US (09/2014): Bilateral ICA 1-39%, left ECA >59%, normal subclavian bilaterally, occluded left vertebral >> FU 2 years  . Chronic kidney disease   . Chronic systolic CHF (congestive heart failure) (HCC)    a. ischemic CM EF 15-20%;  b. s/p AICD 05/24/04;  c. Echo 7/06: EF 30-40%, mild reduced RVSF, d. Echo 12/2015 EF 35-40%  . Elevated PSA   . History of kidney stones    x 1  . HTN (  hypertension)   . Hyperlipidemia   . Hyponatremia 09/22/2020  . ICD (implantable cardiac defibrillator) in place 12-25-2012   MDT CRTD upgrade by Dr Lovena Le  . Myocardial infarction ([place]) 1990  . Pericardial effusion    Echocardiogram (09/2014): EF 25% with distal anterior, distal inferior, distal lateral and apical akinesis, grade 1 diastolic dysfunction, very mild aortic stenosis (mean 7 mmHg) - this may be depressed due to low EF (2-D images suggest mild to moderate aortic stenosis), large pericardial effusion, no RA collapse  . Pneumonia   . Presence of permanent cardiac pacemaker    Medtronic  . PVD (peripheral vascular disease) ([place])    s/p L carotid PTCA/stent 2004  . Transient ischemic  attack   . Type II diabetes mellitus ([place])    type II    ROS:   All systems reviewed and negative except as noted in the HPI.   Past Surgical History:  Procedure Laterality Date  . BI-VENTRICULAR IMPLANTABLE CARDIOVERTER DEFIBRILLATOR UPGRADE N/A 12/25/2012   Procedure: BI-VENTRICULAR IMPLANTABLE CARDIOVERTER DEFIBRILLATOR UPGRADE;  Surgeon: Evans Lance, MD;  Location: [facility] CATH LAB;  Service: Cardiovascular;  Laterality: N/A;  . BIV ICD UPGRADE  12/25/2012   MDT CRTD upgrade by Dr Lovena Le for ischemic cardiomyopathy and worsening conduction system disease  . CARDIAC CATHETERIZATION  06/2003,  01/2004  . CARDIAC CATHETERIZATION N/A 01/13/2016   Procedure: Left Heart Cath and Coronary Angiography;  Surgeon: Jettie Booze, MD;  Location: [facility] CV LAB;  Service: Cardiovascular;  Laterality: N/A;  . CARDIAC DEFIBRILLATOR PLACEMENT  05/24/2004   Implantation of a MDT single-chamber defibrillator  . CAROTID STENT  09/11/2003   Percutaneous transluminal angioplasty and stent placement of the left internal carotid artery.  Marland Kitchen CATARACT EXTRACTION W/ INTRAOCULAR LENS  IMPLANT, BILATERAL Bilateral ~ 2010  . CORONARY ANGIOPLASTY WITH STENT PLACEMENT  1990   "2" (12/25/2012)  . CYSTOSCOPY    . DIRECT LARYNGOSCOPY N/A 08/15/2019   Procedure: MICRODIRECT LARYNGOSCOPY WITH BIOPSY;  Surgeon: Melida Quitter, MD;  Location: [facility];  Service: ENT;  Laterality: N/A;  . ESOPHAGOGASTRODUODENOSCOPY (EGD) WITH PROPOFOL N/A 11/22/2019   Procedure: ESOPHAGOGASTRODUODENOSCOPY (EGD) WITH PROPOFOL with possible dilation;  Surgeon: Clarene Essex, MD;  Location: [facility];  Service: Endoscopy;  Laterality: N/A;  . INSERT / REPLACE / REMOVE PACEMAKER    . IR GASTROSTOMY TUBE MOD SED  11/27/2019  . IR REPLACE G-TUBE SIMPLE WO FLUORO  10/10/2020  . LEAD REVISION N/A 12/25/2012   Procedure: LEAD REVISION;  Surgeon: Evans Lance, MD;  Location: [facility] CATH LAB;  Service: Cardiovascular;  Laterality: N/A;  . RIGID  ESOPHAGOSCOPY N/A 08/15/2019   Procedure: RIGID ESOPHAGOSCOPY;  Surgeon: Melida Quitter, MD;  Location: [facility] OR;  Service: ENT;  Laterality: N/A;     Family History  Problem Relation Age of Onset  . Diabetes Mother   . Healthy Father   . Diabetes Brother   . Heart attack Neg Hx   . Stroke Neg Hx      Social History   Socioeconomic History  . Marital status: Widowed    Spouse name: Not on file  . Number of children: Not on file  . Years of education: 64  . Highest education level: Not on file  Occupational History  . Occupation: retired  Tobacco Use  . Smoking status: Former Smoker    Types: Cigarettes    Quit date: 12/27/1967    Years since quitting: 52.8  . Smokeless tobacco: Never Used  Vaping Use  .  Vaping Use: Never used  Substance and Sexual Activity  . Alcohol use: No    Alcohol/week: 0.0 standard drinks    Comment: 12/25/2012 "quit all alcohol 60 yr ago"  . Drug use: No  . Sexual activity: Not on file  Other Topics Concern  . Not on file  Social History Narrative   ** Merged History Encounter **       Single, 2 adult children, daughter in [place], son in [place] Strain: Not on [employer] Insecurity: Not on file  Transportation Needs: Not on file  Physical Activity: Not on file  Stress: Not on file  Social Connections: Not on file  Intimate Partner Violence: Not on file     BP 92/60   Pulse 90   Ht $R'5\' 11"'mw$  (1.803 m)   SpO2 99%   BMI 19.09 kg/m   Physical Exam:  Chronically ill appearing cachectic elderly man, NAD HEENT: Unremarkable Neck:  7 cm JVD, no thyromegally Lymphatics:  No adenopathy Back:  No CVA tenderness Lungs:  Clear with scattered wheezes HEART:  Regular rate rhythm, no murmurs, no rubs, no clicks Abd:  soft, positive bowel sounds, no organomegally, no rebound, no guarding Ext:  2 plus pulses, no edema, no cyanosis, no clubbing Skin:  No rashes no nodules Neuro:  CN II  through XII intact, motor grossly intact   DEVICE  Normal device function.  See PaceArt for details. RRT  Assess/Plan: 1. End stage CM - the patient has stage 4 CHF, and has no hope for improvement. He will increase his lasix. He is non-ambulatory. 2. VT - he has not had any additional episodes.  3. Failure to thrive - his weight continues down ward. He is cachectic with a feeding tube.  4. Throat CA - he is s/p XRT.  5. ICD - his ICD has reached RRT. He is not a candidate for re-implant. The risks/benefits are such that his device would surely get infected. He has no reserve. I suspect that the battery will quit after he does.   Carleene Overlie Latisa Belay,MD

## 2020-11-02 ENCOUNTER — Encounter (HOSPITAL_COMMUNITY): Payer: Medicare (Managed Care)

## 2020-11-02 ENCOUNTER — Encounter: Payer: Medicare (Managed Care) | Admitting: Vascular Surgery

## 2020-11-02 LAB — CUP PACEART INCLINIC DEVICE CHECK
Battery Remaining Longevity: 1 mo — CL
Battery Voltage: 2.66 V
Brady Statistic AP VP Percent: 9.99 %
Brady Statistic AP VS Percent: 0.01 %
Brady Statistic AS VP Percent: 89.78 %
Brady Statistic AS VS Percent: 0.22 %
Brady Statistic RA Percent Paced: 9.98 %
Brady Statistic RV Percent Paced: 99.64 %
Date Time Interrogation Session: 20220207125700
HighPow Impedance: 40 Ohm
Implantable Lead Implant Date: 20140402
Implantable Lead Implant Date: 20140402
Implantable Lead Implant Date: 20140402
Implantable Lead Location: 753858
Implantable Lead Location: 753859
Implantable Lead Location: 753860
Implantable Lead Model: 4194
Implantable Lead Model: 5076
Implantable Lead Model: 6935
Implantable Pulse Generator Implant Date: 20140402
Lead Channel Impedance Value: 228 Ohm
Lead Channel Impedance Value: 361 Ohm
Lead Channel Impedance Value: 399 Ohm
Lead Channel Impedance Value: 399 Ohm
Lead Channel Impedance Value: 456 Ohm
Lead Channel Impedance Value: 513 Ohm
Lead Channel Pacing Threshold Amplitude: 0.875 V
Lead Channel Pacing Threshold Amplitude: 0.875 V
Lead Channel Pacing Threshold Amplitude: 1 V
Lead Channel Pacing Threshold Pulse Width: 0.4 ms
Lead Channel Pacing Threshold Pulse Width: 0.4 ms
Lead Channel Pacing Threshold Pulse Width: 0.6 ms
Lead Channel Sensing Intrinsic Amplitude: 13.5 mV
Lead Channel Sensing Intrinsic Amplitude: 14.625 mV
Lead Channel Sensing Intrinsic Amplitude: 2.125 mV
Lead Channel Sensing Intrinsic Amplitude: 2.875 mV
Lead Channel Setting Pacing Amplitude: 1.5 V
Lead Channel Setting Pacing Amplitude: 2 V
Lead Channel Setting Pacing Amplitude: 2.5 V
Lead Channel Setting Pacing Pulse Width: 0.4 ms
Lead Channel Setting Pacing Pulse Width: 0.6 ms
Lead Channel Setting Sensing Sensitivity: 0.3 mV

## 2020-11-09 ENCOUNTER — Ambulatory Visit: Payer: Medicare (Managed Care) | Admitting: Gastroenterology

## 2020-11-19 NOTE — Progress Notes (Signed)
Per Dr Tanna Furry 11/01/2020 office visit note patient is at the very end of his life with less than a few months and probably weeks to live.  No further monthly ICM follow ups needed due to current condition.

## 2020-11-23 DEATH — deceased

## 2021-03-18 ENCOUNTER — Ambulatory Visit: Payer: Self-pay | Admitting: Radiation Oncology

## 2021-03-19 IMAGING — CT CT RENAL STONE PROTOCOL
2 of 4 series · 15 of 46 positions shown, 17 images · non-contrast
Comparison: April 23, 2017

CLINICAL DATA: Rectal pain

EXAM:
CT ABDOMEN AND PELVIS WITHOUT CONTRAST
TECHNIQUE: Multidetector CT imaging of the abdomen and pelvis was performed
following the standard protocol without IV contrast.

[Series 2: axial st · axial · 0.69mm/px · z∈[+1196,+1576]mm · 12 of 84 slices shown, 14 images]
[im 4/84  soft-tissue]
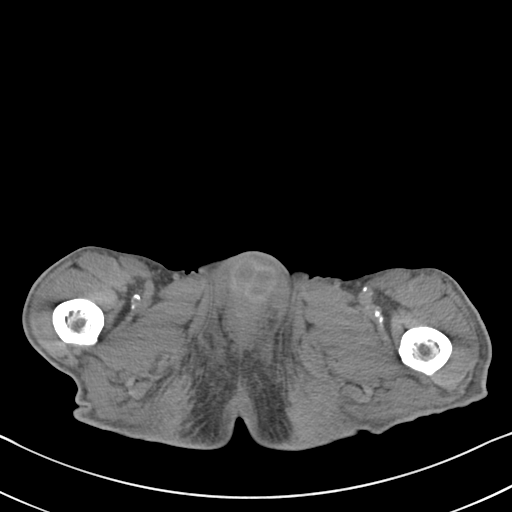
[im 4/84  bone]
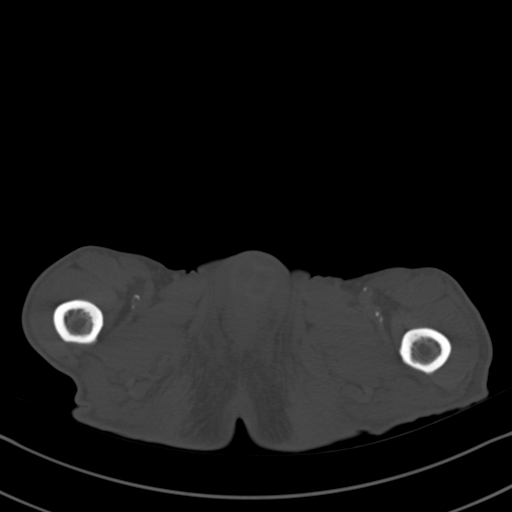
[im 12/84  soft-tissue]
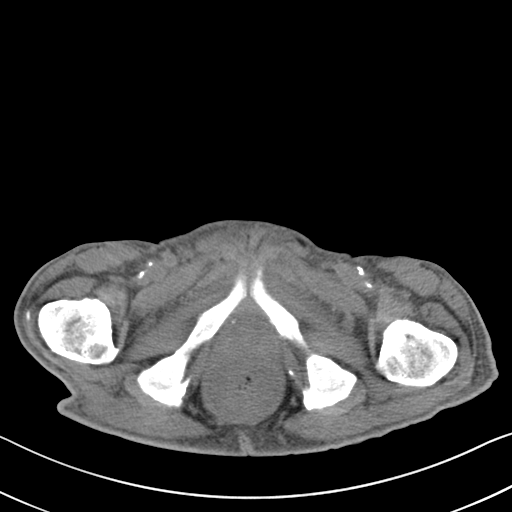
[im 20/84  soft-tissue]
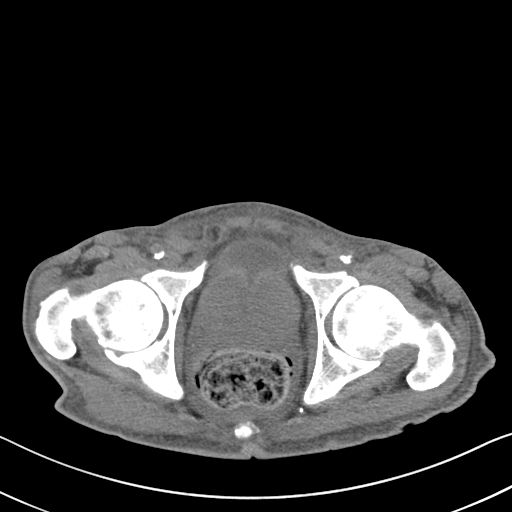
[im 24/84  soft-tissue]
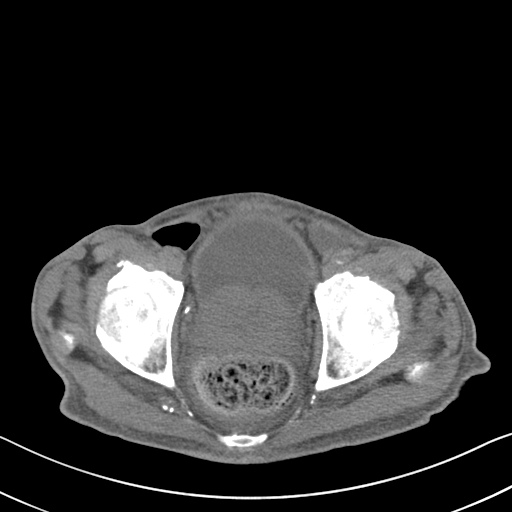
[im 32/84  soft-tissue]
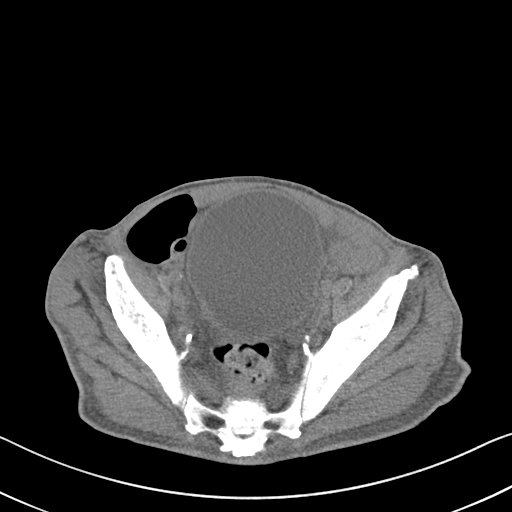
[im 40/84  soft-tissue]
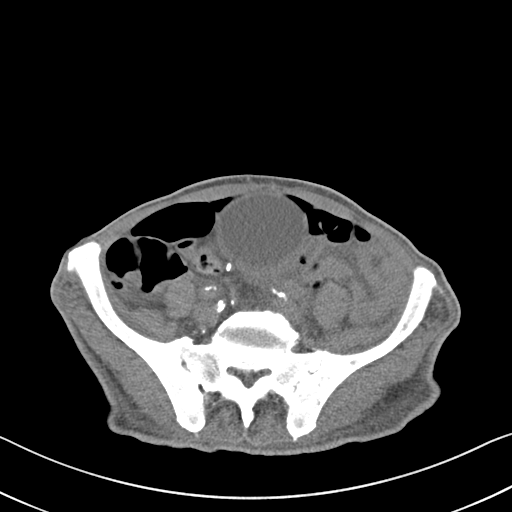
[im 44/84  soft-tissue]
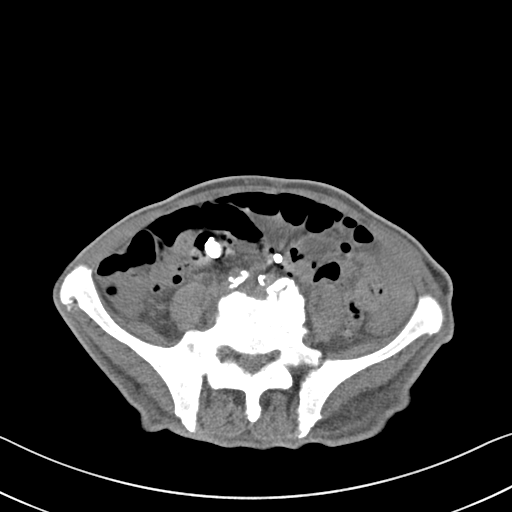
[im 52/84  soft-tissue]
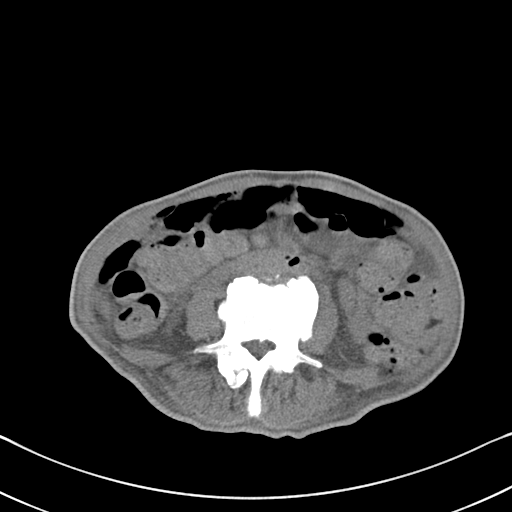
[im 60/84  soft-tissue]
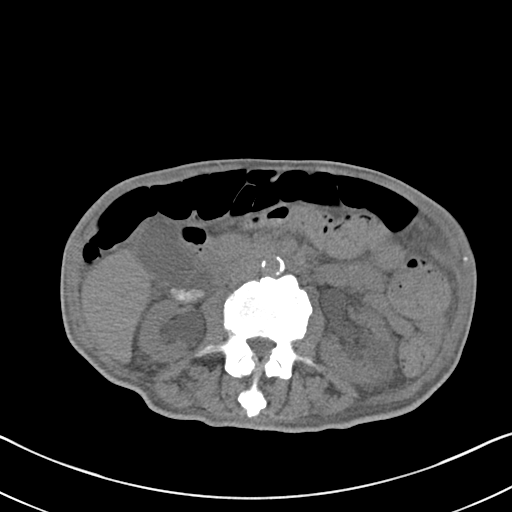
[im 60/84  bone]
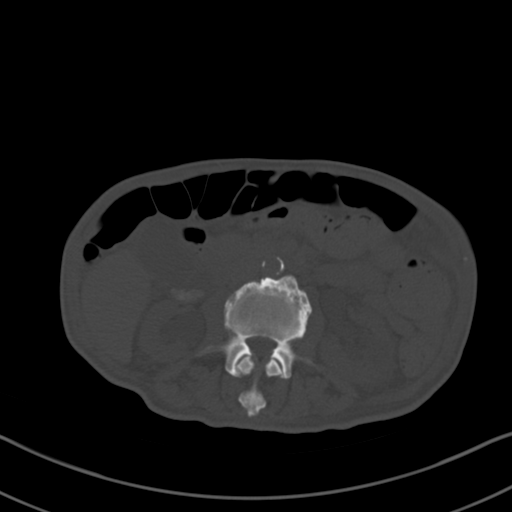
[im 64/84  soft-tissue]
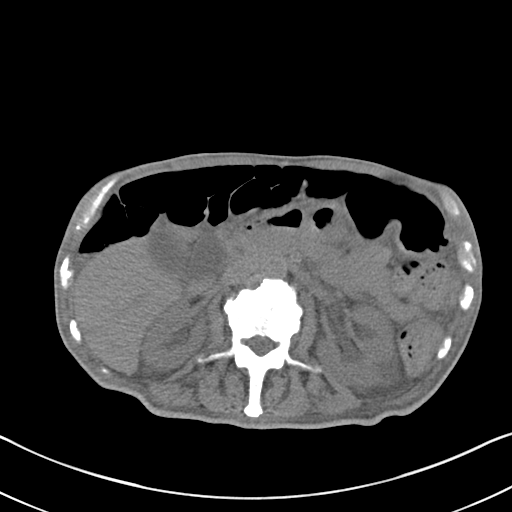
[im 72/84  soft-tissue]
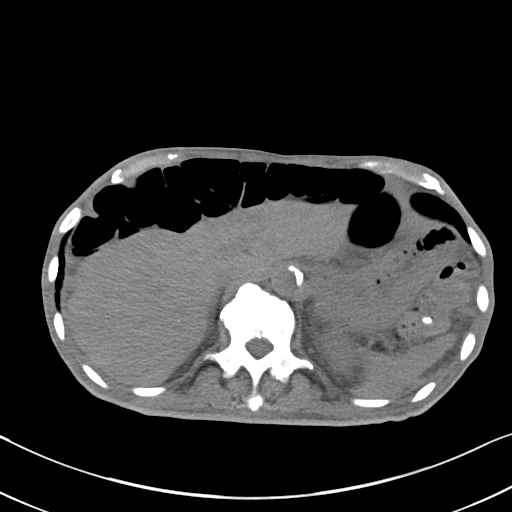
[im 80/84  soft-tissue]
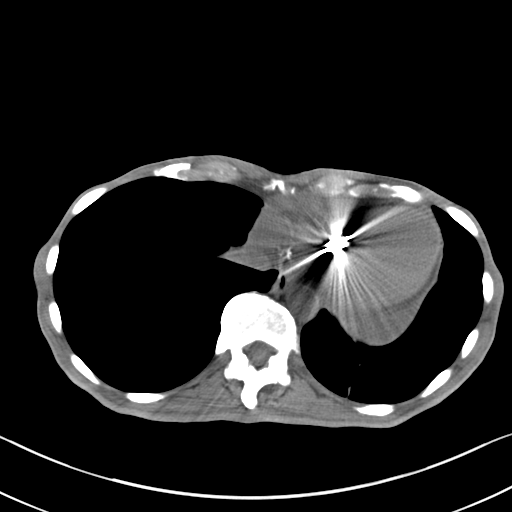

[Series 4: coronal · coronal · 0.64mm/px · 3 of 115 slices shown]
[im 39/115  soft-tissue]
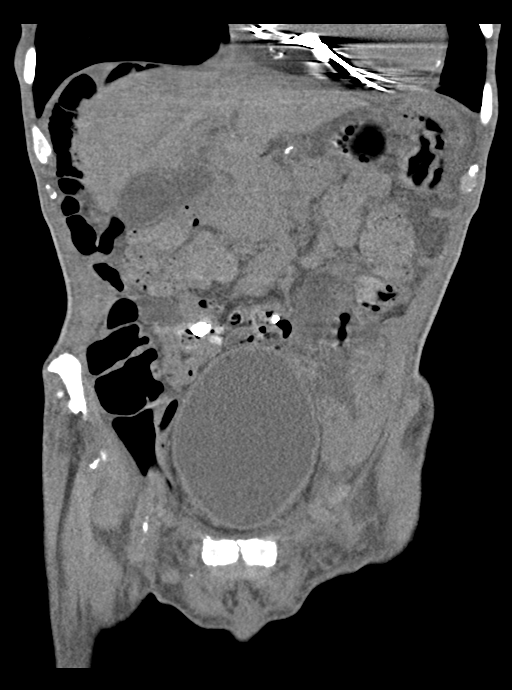
[im 51/115  soft-tissue]
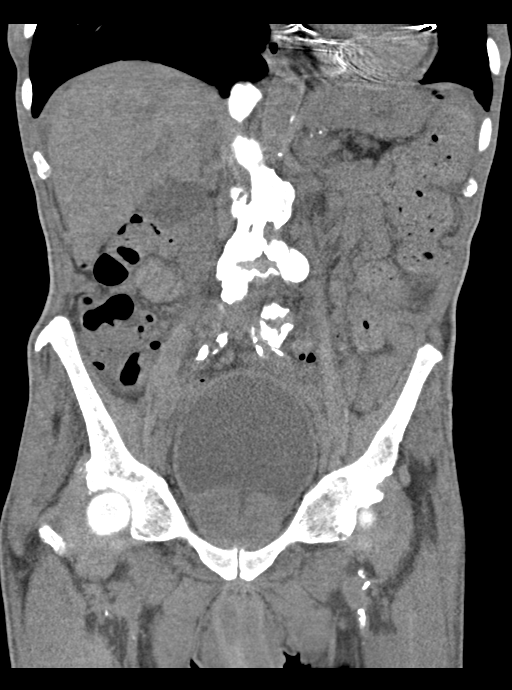
[im 64/115  soft-tissue]
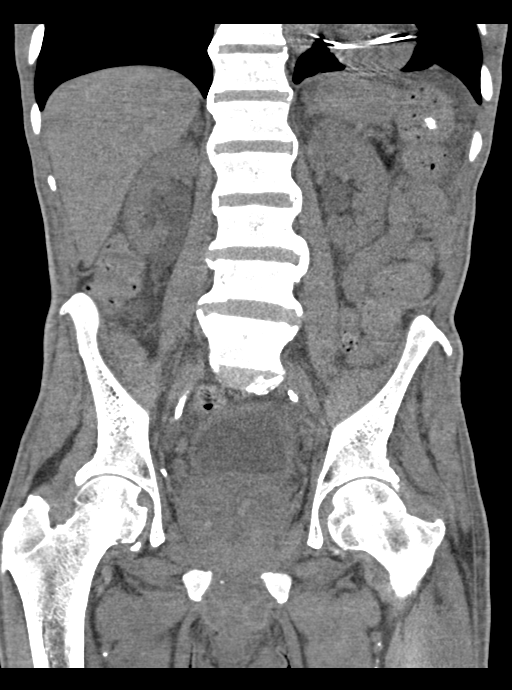

[15 of 46 positions shown; findings below may reference images not displayed]

FINDINGS: Lower chest: The lung bases are clear.The heart size is normal.
There is a small pericardial effusion.

Hepatobiliary: The liver is normal. Cholelithiasis without acute
inflammation.There is no biliary ductal dilation.

Pancreas: Normal contours without ductal dilatation. No
peripancreatic fluid collection.

Spleen: No splenic laceration or hematoma.

Adrenals/Urinary Tract:

--Adrenal glands: No adrenal hemorrhage.

--Right kidney/ureter: There is mild right-sided
hydroureteronephrosis. The right kidney is somewhat atrophic in
comparison to the left.

--Left kidney/ureter: There is mild left-sided hydroureteronephrosis
to the level of the urinary bladder.

--Urinary bladder: The urinary bladder demonstrates significant
distension and mild bladder wall thickening.

Stomach/Bowel:

--Stomach/Duodenum: No hiatal hernia or other gastric abnormality.
Normal duodenal course and caliber.

--Small bowel: No dilatation or inflammation.

--Colon: There is a large amount of stool throughout the colon.
There is a large amount of stool in the rectum with rectal wall
thickening and mild adjacent fat stranding. This appearance is
similar to prior study.

--Appendix: Normal.

Vascular/Lymphatic: Atherosclerotic calcification is present within
the non-aneurysmal abdominal aorta, without hemodynamically
significant stenosis.

--No retroperitoneal lymphadenopathy.

--No mesenteric lymphadenopathy.

--No pelvic or inguinal lymphadenopathy.

Reproductive: There is severe prostatomegaly.

Other: No ascites or free air. The abdominal wall is normal.

Musculoskeletal. No acute displaced fractures.
IMPRESSION: 1. Mild bilateral hydroureteronephrosis that appears to be secondary
to a distended urinary bladder. There is severe prostatomegaly
likely causing bladder outlet obstruction.
2. Large amount of stool throughout the colon. There is rectal wall
thickening which may be secondary to proctitis or stercoral colitis.
This is similar to prior study in 6474.
3.  Aortic Atherosclerosis ([id number]).
